# Patient Record
Sex: Female | Born: 1945
Health system: Southern US, Community
[De-identification: ages and names within clinical notes are randomized; demographics above are authoritative.]

## PROBLEM LIST (undated history)

## (undated) DIAGNOSIS — I4891 Unspecified atrial fibrillation: Secondary | ICD-10-CM

## (undated) DIAGNOSIS — K760 Fatty (change of) liver, not elsewhere classified: Secondary | ICD-10-CM

## (undated) DIAGNOSIS — Z9989 Dependence on other enabling machines and devices: Secondary | ICD-10-CM

## (undated) DIAGNOSIS — I2510 Atherosclerotic heart disease of native coronary artery without angina pectoris: Secondary | ICD-10-CM

## (undated) DIAGNOSIS — Z923 Personal history of irradiation: Secondary | ICD-10-CM

## (undated) DIAGNOSIS — I4892 Unspecified atrial flutter: Secondary | ICD-10-CM

## (undated) DIAGNOSIS — Z9181 History of falling: Secondary | ICD-10-CM

## (undated) DIAGNOSIS — K5790 Diverticulosis of intestine, part unspecified, without perforation or abscess without bleeding: Secondary | ICD-10-CM

## (undated) DIAGNOSIS — IMO0001 Reserved for inherently not codable concepts without codable children: Secondary | ICD-10-CM

## (undated) DIAGNOSIS — R55 Syncope and collapse: Secondary | ICD-10-CM

## (undated) DIAGNOSIS — Z950 Presence of cardiac pacemaker: Secondary | ICD-10-CM

## (undated) DIAGNOSIS — T4145XA Adverse effect of unspecified anesthetic, initial encounter: Secondary | ICD-10-CM

## (undated) DIAGNOSIS — Q2112 Patent foramen ovale: Secondary | ICD-10-CM

## (undated) DIAGNOSIS — G4733 Obstructive sleep apnea (adult) (pediatric): Secondary | ICD-10-CM

## (undated) DIAGNOSIS — C50919 Malignant neoplasm of unspecified site of unspecified female breast: Secondary | ICD-10-CM

## (undated) DIAGNOSIS — M5412 Radiculopathy, cervical region: Secondary | ICD-10-CM

## (undated) DIAGNOSIS — E039 Hypothyroidism, unspecified: Secondary | ICD-10-CM

## (undated) DIAGNOSIS — D126 Benign neoplasm of colon, unspecified: Secondary | ICD-10-CM

## (undated) DIAGNOSIS — Z9221 Personal history of antineoplastic chemotherapy: Secondary | ICD-10-CM

## (undated) DIAGNOSIS — J449 Chronic obstructive pulmonary disease, unspecified: Secondary | ICD-10-CM

## (undated) DIAGNOSIS — E662 Morbid (severe) obesity with alveolar hypoventilation: Secondary | ICD-10-CM

## (undated) DIAGNOSIS — I10 Essential (primary) hypertension: Secondary | ICD-10-CM

## (undated) DIAGNOSIS — L0390 Cellulitis, unspecified: Secondary | ICD-10-CM

## (undated) DIAGNOSIS — Q211 Atrial septal defect: Secondary | ICD-10-CM

## (undated) DIAGNOSIS — I509 Heart failure, unspecified: Secondary | ICD-10-CM

## (undated) DIAGNOSIS — F329 Major depressive disorder, single episode, unspecified: Secondary | ICD-10-CM

## (undated) DIAGNOSIS — K224 Dyskinesia of esophagus: Secondary | ICD-10-CM

## (undated) DIAGNOSIS — E785 Hyperlipidemia, unspecified: Secondary | ICD-10-CM

## (undated) DIAGNOSIS — R7302 Impaired glucose tolerance (oral): Secondary | ICD-10-CM

## (undated) DIAGNOSIS — I219 Acute myocardial infarction, unspecified: Secondary | ICD-10-CM

## (undated) DIAGNOSIS — Z9981 Dependence on supplemental oxygen: Secondary | ICD-10-CM

## (undated) DIAGNOSIS — Z8742 Personal history of other diseases of the female genital tract: Secondary | ICD-10-CM

## (undated) DIAGNOSIS — G629 Polyneuropathy, unspecified: Secondary | ICD-10-CM

## (undated) DIAGNOSIS — D649 Anemia, unspecified: Secondary | ICD-10-CM

## (undated) DIAGNOSIS — T8859XA Other complications of anesthesia, initial encounter: Secondary | ICD-10-CM

## (undated) HISTORY — PX: CERVICAL DISC SURGERY: SHX588

## (undated) HISTORY — DX: Hyperlipidemia, unspecified: E78.5

## (undated) HISTORY — DX: Obstructive sleep apnea (adult) (pediatric): G47.33

## (undated) HISTORY — PX: EPIGASTRIC HERNIA REPAIR: SUR1177

## (undated) HISTORY — DX: Essential (primary) hypertension: I10

## (undated) HISTORY — DX: Malignant neoplasm of unspecified site of unspecified female breast: C50.919

## (undated) HISTORY — DX: Radiculopathy, cervical region: M54.12

## (undated) HISTORY — DX: Benign neoplasm of colon, unspecified: D12.6

## (undated) HISTORY — DX: Major depressive disorder, single episode, unspecified: F32.9

## (undated) HISTORY — DX: Dependence on other enabling machines and devices: Z99.89

## (undated) HISTORY — PX: COLONOSCOPY: SHX174

## (undated) HISTORY — DX: Anemia, unspecified: D64.9

## (undated) HISTORY — PX: EYE SURGERY: SHX253

## (undated) HISTORY — DX: Morbid (severe) obesity with alveolar hypoventilation: E66.2

## (undated) HISTORY — PX: DILATION AND CURETTAGE OF UTERUS: SHX78

## (undated) HISTORY — DX: Polyneuropathy, unspecified: G62.9

## (undated) HISTORY — PX: BREAST BIOPSY: SHX20

## (undated) HISTORY — DX: Hypothyroidism, unspecified: E03.9

## (undated) HISTORY — DX: Impaired glucose tolerance (oral): R73.02

## (undated) HISTORY — DX: Unspecified atrial flutter: I48.92

## (undated) HISTORY — DX: History of falling: Z91.81

## (undated) HISTORY — DX: Atherosclerotic heart disease of native coronary artery without angina pectoris: I25.10

## (undated) HISTORY — DX: Dyskinesia of esophagus: K22.4

## (undated) HISTORY — PX: OVARIAN CYST REMOVAL: SHX89

## (undated) HISTORY — PX: APPENDECTOMY: SHX54

## (undated) HISTORY — DX: Diverticulosis of intestine, part unspecified, without perforation or abscess without bleeding: K57.90

## (undated) HISTORY — PX: LOOP RECORDER IMPLANT: SHX5954

---
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
## 2004-05-06 ENCOUNTER — Encounter: Payer: Self-pay | Admitting: Cardiology

## 2004-05-21 HISTORY — PX: BREAST LUMPECTOMY: SHX2

## 2005-10-26 ENCOUNTER — Encounter: Payer: Self-pay | Admitting: Cardiology

## 2008-10-28 ENCOUNTER — Emergency Department (HOSPITAL_COMMUNITY): Admission: EM | Admit: 2008-10-28 | Discharge: 2008-10-28 | Payer: Self-pay | Admitting: Emergency Medicine

## 2008-10-29 ENCOUNTER — Emergency Department (HOSPITAL_COMMUNITY): Admission: EM | Admit: 2008-10-29 | Discharge: 2008-10-29 | Payer: Self-pay | Admitting: Emergency Medicine

## 2008-11-08 ENCOUNTER — Emergency Department (HOSPITAL_COMMUNITY): Admission: EM | Admit: 2008-11-08 | Discharge: 2008-11-08 | Payer: Self-pay | Admitting: Emergency Medicine

## 2008-11-18 ENCOUNTER — Ambulatory Visit: Payer: Self-pay | Admitting: Vascular Surgery

## 2008-11-18 ENCOUNTER — Encounter (INDEPENDENT_AMBULATORY_CARE_PROVIDER_SITE_OTHER): Payer: Self-pay | Admitting: Emergency Medicine

## 2008-11-18 ENCOUNTER — Emergency Department (HOSPITAL_COMMUNITY): Admission: EM | Admit: 2008-11-18 | Discharge: 2008-11-19 | Payer: Self-pay | Admitting: Emergency Medicine

## 2009-02-19 ENCOUNTER — Encounter: Payer: Self-pay | Admitting: Infectious Diseases

## 2009-02-28 ENCOUNTER — Encounter: Payer: Self-pay | Admitting: Infectious Diseases

## 2009-02-28 DIAGNOSIS — L03119 Cellulitis of unspecified part of limb: Secondary | ICD-10-CM

## 2009-02-28 DIAGNOSIS — L02419 Cutaneous abscess of limb, unspecified: Secondary | ICD-10-CM

## 2009-03-17 ENCOUNTER — Ambulatory Visit: Payer: Self-pay | Admitting: Infectious Diseases

## 2009-03-17 DIAGNOSIS — E039 Hypothyroidism, unspecified: Secondary | ICD-10-CM

## 2009-03-17 DIAGNOSIS — I2510 Atherosclerotic heart disease of native coronary artery without angina pectoris: Secondary | ICD-10-CM

## 2009-03-17 DIAGNOSIS — E785 Hyperlipidemia, unspecified: Secondary | ICD-10-CM

## 2009-03-17 DIAGNOSIS — I10 Essential (primary) hypertension: Secondary | ICD-10-CM

## 2009-03-18 ENCOUNTER — Encounter: Payer: Self-pay | Admitting: Infectious Diseases

## 2010-02-12 ENCOUNTER — Inpatient Hospital Stay (HOSPITAL_COMMUNITY): Admission: EM | Admit: 2010-02-12 | Discharge: 2010-02-19 | Payer: Self-pay | Admitting: Emergency Medicine

## 2010-02-12 ENCOUNTER — Ambulatory Visit: Payer: Self-pay | Admitting: Cardiovascular Disease

## 2010-02-12 ENCOUNTER — Ambulatory Visit: Payer: Self-pay | Admitting: Internal Medicine

## 2010-02-13 ENCOUNTER — Encounter: Payer: Self-pay | Admitting: Cardiology

## 2010-02-13 ENCOUNTER — Ambulatory Visit: Payer: Self-pay | Admitting: Vascular Surgery

## 2010-02-13 ENCOUNTER — Encounter (INDEPENDENT_AMBULATORY_CARE_PROVIDER_SITE_OTHER): Payer: Self-pay | Admitting: Internal Medicine

## 2010-02-17 ENCOUNTER — Encounter: Payer: Self-pay | Admitting: Internal Medicine

## 2010-03-10 ENCOUNTER — Ambulatory Visit: Payer: Self-pay | Admitting: Pulmonary Disease

## 2010-03-10 DIAGNOSIS — G4730 Sleep apnea, unspecified: Secondary | ICD-10-CM

## 2010-03-10 DIAGNOSIS — J449 Chronic obstructive pulmonary disease, unspecified: Secondary | ICD-10-CM

## 2010-03-13 ENCOUNTER — Ambulatory Visit: Payer: Self-pay | Admitting: Cardiology

## 2010-03-13 DIAGNOSIS — I214 Non-ST elevation (NSTEMI) myocardial infarction: Secondary | ICD-10-CM

## 2010-03-17 ENCOUNTER — Telehealth (INDEPENDENT_AMBULATORY_CARE_PROVIDER_SITE_OTHER): Payer: Self-pay | Admitting: *Deleted

## 2010-03-20 ENCOUNTER — Inpatient Hospital Stay (HOSPITAL_COMMUNITY): Admission: EM | Admit: 2010-03-20 | Discharge: 2010-03-24 | Payer: Self-pay | Admitting: Emergency Medicine

## 2010-03-20 ENCOUNTER — Ambulatory Visit: Payer: Self-pay | Admitting: Vascular Surgery

## 2010-03-30 ENCOUNTER — Encounter: Payer: Self-pay | Admitting: Pulmonary Disease

## 2010-03-30 ENCOUNTER — Ambulatory Visit (HOSPITAL_BASED_OUTPATIENT_CLINIC_OR_DEPARTMENT_OTHER): Admission: RE | Admit: 2010-03-30 | Discharge: 2010-03-30 | Payer: Self-pay | Admitting: Pulmonary Disease

## 2010-04-06 ENCOUNTER — Ambulatory Visit: Payer: Self-pay | Admitting: Pulmonary Disease

## 2010-04-07 ENCOUNTER — Ambulatory Visit: Payer: Self-pay | Admitting: Pulmonary Disease

## 2010-04-07 DIAGNOSIS — Z9981 Dependence on supplemental oxygen: Secondary | ICD-10-CM

## 2010-04-09 ENCOUNTER — Ambulatory Visit: Payer: Self-pay | Admitting: Pulmonary Disease

## 2010-04-10 ENCOUNTER — Encounter: Payer: Self-pay | Admitting: Pulmonary Disease

## 2010-04-15 ENCOUNTER — Telehealth (INDEPENDENT_AMBULATORY_CARE_PROVIDER_SITE_OTHER): Payer: Self-pay | Admitting: *Deleted

## 2010-04-24 ENCOUNTER — Ambulatory Visit: Payer: Self-pay | Admitting: Cardiology

## 2010-05-20 ENCOUNTER — Telehealth: Payer: Self-pay | Admitting: Cardiology

## 2010-05-26 ENCOUNTER — Encounter: Payer: Self-pay | Admitting: Pulmonary Disease

## 2010-05-26 ENCOUNTER — Encounter (HOSPITAL_COMMUNITY): Admission: RE | Admit: 2010-05-26 | Discharge: 2010-06-19 | Payer: Self-pay | Admitting: Pulmonary Disease

## 2010-06-20 ENCOUNTER — Encounter (HOSPITAL_COMMUNITY)
Admission: RE | Admit: 2010-06-20 | Discharge: 2010-09-18 | Payer: Self-pay | Source: Home / Self Care | Attending: Pulmonary Disease | Admitting: Pulmonary Disease

## 2010-07-08 ENCOUNTER — Telehealth (INDEPENDENT_AMBULATORY_CARE_PROVIDER_SITE_OTHER): Payer: Self-pay | Admitting: *Deleted

## 2010-08-05 ENCOUNTER — Encounter (INDEPENDENT_AMBULATORY_CARE_PROVIDER_SITE_OTHER): Payer: Self-pay | Admitting: Emergency Medicine

## 2010-08-05 ENCOUNTER — Ambulatory Visit: Payer: Self-pay | Admitting: Vascular Surgery

## 2010-08-05 ENCOUNTER — Inpatient Hospital Stay (HOSPITAL_COMMUNITY): Admission: EM | Admit: 2010-08-05 | Discharge: 2010-08-07 | Payer: Self-pay | Admitting: Emergency Medicine

## 2010-08-24 ENCOUNTER — Emergency Department (HOSPITAL_COMMUNITY)
Admission: EM | Admit: 2010-08-24 | Discharge: 2010-08-24 | Payer: Self-pay | Source: Home / Self Care | Admitting: Emergency Medicine

## 2010-09-04 ENCOUNTER — Ambulatory Visit: Payer: Self-pay | Admitting: Cardiology

## 2010-09-10 ENCOUNTER — Ambulatory Visit: Payer: Self-pay | Admitting: Pulmonary Disease

## 2010-10-20 NOTE — Progress Notes (Signed)
Summary: returning call  Phone Note Call from Patient   Caller: Patient Call For: alva Summary of Call: Returning phone call from yesterday. Initial call taken by: Netta Neat,  April 15, 2010 1:56 PM  Follow-up for Phone Call        Spoke with pt and notified of results of ONO.  Pt verbalized understanding and will cont. o2 at hs. Follow-up by: Tilden Dome,  April 15, 2010 2:03 PM

## 2010-10-20 NOTE — Letter (Signed)
[facility]   Imported By: Marilynne Drivers 05/11/2010 12:27:01  _____________________________________________________________________  External Attachment:    Type:   Image     Comment:   External Document

## 2010-10-20 NOTE — Miscellaneous (Signed)
Summary: Orders Update pft charges  Clinical Lists Changes  Orders: Added new Service order of Lung Volumes (04045) - Signed Added new Service order of Carbon Monoxide diffusing w/capacity (91368) - Signed Added new Service order of Spirometry (Pre & Post) [phone]) - Signed

## 2010-10-20 NOTE — Assessment & Plan Note (Signed)
Summary: f30m  Visit Type:  1 month follow up Referring Standley Bargo:  hospital Primary Shae Hinnenkamp:  Dr. JEsperanza Richters CC:  Sob and fluid retention.  History of Present Illness: Ms. HStmariewas readmitted to the hospital recently after an admission again for recurrent cellulitis.  This has improved. She and I had a lengthy discussioin today about her issues.  [facility]    Findings were as noted: EF 75%.  RCA  proximal 75% tubular, 25% prox LAD, 25% d1, 50% D2.   [facility], with RL edema, worse than left, with presumed cellulitis.  She had a PIC line placed, and was placed on chronic treatment.   Current Medications (verified): 1)  Metoprolol Tartrate 25 Mg Tabs (Metoprolol Tartrate) .... Take 1 1/2 Tablet By Mouth Every Morning and Take 1/2 Tab By Mouth At Bedtime 2)  Klor-Con 10 10 Meq Cr-Tabs (Potassium Chloride) .... Take One Tablet Daily 3)  Levothyroxine Sodium 200 Mcg Tabs (Levothyroxine Sodium) .... Take 1.765m On Tuesdays- Frydays and Sunday and 20058mRest of The Week 4)  Cymbalta 30 Mg Cpep (Duloxetine Hcl) .... Take Two Capsules At Bedtime 5)  Furosemide 40 Mg Tabs (Furosemide) .... Take One Tablet Daily 6)  Pravastatin Sodium 40 Mg Tabs (Pravastatin Sodium) .... Take One Tablet Daily 7)  Aspir-Low 81 Mg Tbec (Aspirin) .... Take 1 Tablet By Mouth Once A Day 8)  Amitriptyline Hcl 100 Mg Tabs (Amitriptyline Hcl) .... At Bedtime For Neuropathic Pain 9)  Proair Hfa 108 (90 Base) Mcg/act Aers (Albuterol Sulfate) .... 2 Puffs Every 4 Hours As Needed 10)  Vitamin D3 2000 Unit Caps (Cholecalciferol) .... Take 1 Tablet By Mouth Every Morning 11)  Vitamin C 500 Mg Tabs (Ascorbic Acid) .... Take 2 Tablet By Mouth Once A Day 12)  Omega-3 Fish Oil 1200 Mg Caps (Omega-3 Fatty Acids) .... Take 2 Tablet By Mouth Every Morning 13)  Medroxyprogesterone Acetate 10 Mg Tabs (Medroxyprogesterone  Acetate) .... Take 1 Tablet By Mouth Once A Day X 10 Days  Allergies: 1)  ! Lisinopril (Lisinopril) 2)  ! Codeine  Past History:  Past Medical History: Last updated: 04/21/2010  Hyperlipidemia  Hypertension  Hypothyroidism  Breast CA- Left-  XRT and CTX April 2005  Coronary artery disease  Non-ST elevation MI in June of 2011.   History of acute renal failure in June of 2011.   Obstructive sleep apnea.  She uses a BiPAP at night.   Acute on chronic respiratory failure.   Anemia  Left lower extremity cellulitis with 2 cc abscess  Past Surgical History: Last updated: 04/21/2010  1. Surgeries include back surgery.  2. She has had a lumpectomy.   3. Laparotomy.   4. Left eye surgery.  5. Ovarian cyst removal  Family History: Last updated: 04/21/2010 FHx- breast CA  Is significant for her father who died of an MI in his   60s56ser mother who had uterine cancer.  She has 11 brothers and  sisters; she tells me that several of her brothers died with alcoholism.   Social History: Last updated: 04/21/2010  Is pertinent for rare alcohol.  She is an ex-smoker   with a 20 pack year history.  No drug use.  She has one grandson who  lives with her.  Risk Factors: Alcohol Use: 0 (03/17/2009) Caffeine Use: 0 (03/17/2009) Exercise: yes (03/17/2009)  Risk  Factors: Smoking Status: quit (03/10/2010) Packs/Day: 1.0 (03/10/2010)  Vital Signs:  Patient profile:   65 year old female Height:      66 inches Weight:      336 pounds BMI:     54.43 Pulse rate:   88 / minute Pulse rhythm:   regular Resp:     20 per minute BP sitting:   110 / 64  (right arm) Cuff size:   large  Vitals Entered By: Sidney Ace (April 24, 2010 3:14 PM)  Physical Exam  General:  Overweight, chronically ill, but delightful women in no distress Head:  normocephalic and atraumatic Eyes:  PERRLA/EOM intact; conjunctiva and lids normal. Lungs:  Clear bilaterally to auscultation and percussion. Heart:   Heart sounds distant.  Normal S1 and S2.  No definite murmur.  Abdomen:  Obese. Extremities:  Edema L>R.  Cellulitis controlled.   Neurologic:  Alert and oriented x 3.   Echocardiogram  Procedure date:  02/13/2010  Findings:       Study Conclusions    - Left ventricle: The cavity size was normal. Wall thickness was     increased in a pattern of moderate LVH. Systolic function was     normal. The estimated ejection fraction was in the range of 55% to     60%.   - Aortic valve: There was very mild stenosis.  Sleep Study  Procedure date:  03/30/2010  Findings:       </IMPRESSIONS-   1. Mild sleep disordered breathing with predominant respiratory effort       related arousals, suggestive of upper airway resistance syndrome.   2. Sleep related hypoxemia, note that this study was performed on 2       liters of oxygen.   3. No evidence of limb movements, cardiac arrhythmias, or behavioral       disturbance during sleep.      RECOMMENDATIONS/>   1. Weight loss would be recommended.   2. Nocturnal oximetry on room air should be assessed if significant       desaturations are noted.  This would be related to underlying       cardiopulmonary disease, not the long history of smoking.   3. She should be cautioned against driving when sleepy.  She should be       asked to avoid medications with sedative side effects.   Impression & Recommendations:  Problem # 1:  CORONARY ARTERY DISEASE (ICD-414.00) See results of cath study.  She and I had a very long discussion today about options.  Results of prior trials discussed.  Cath at [facility] does demonstrate some disease, but only enzyme elevation occurred in face of CO2 narcosis, and severe respiratory failure.   Now she is without typical ischemic symptoms.  As these are controlled, options of cath versus conservative medical management discussed. Currently, plan is for the latter.  Will continue to monitor closely. Her updated medication list  for this problem includes:    Metoprolol Tartrate 25 Mg Tabs (Metoprolol tartrate) .[name]... Take 1 1/2 tablet by mouth every morning and take 1/2 tab by mouth at bedtime    Aspir-low 81 Mg Tbec (Aspirin) .[name]... Take 1 tablet by mouth once a day  Problem # 2:  HYPOTHYROIDISM (ICD-244.9) treated Her updated medication list for this problem includes:    Levothyroxine Sodium 200 Mcg Tabs (Levothyroxine sodium) .[name]... Take 1.54mg on tuesdays- frydays and sunday and 2019m rest of the week  Problem # 3:  OBSTRUCTIVE SLEEP APNEA (ICD-780.57) see findings of sleep studies. Wieght loss encouraged.   Problem # 4:  CELLULITIS, LEG, LEFT (ICD-682.6) currently on four weeks of antibiotic treatment, and apparently improved per patient.    Patient Instructions: 1)  Your physician recommends that you schedule a follow-up appointment in: 2 months

## 2010-10-20 NOTE — Progress Notes (Signed)
  Records request faxed to [place]  March 17, 2010 1:53 PM

## 2010-10-20 NOTE — Progress Notes (Addendum)
Summary: needs clearance for procedure  Phone Note From Other Clinic Call back at [facility] Phone [phone]   Caller: Provider Summary of Call: Per Dr Guy Franco GYN DR  pt needs a [facility] for postmenapausal bleeding. is pt ok for this procedure pager # [phone] fax [phone] Initial call taken by: Lorraine Lax,  May 20, 2010 11:03 AM  Follow-up for Phone Call        Pager called.  Awaiting call back.  Patientis somewhat high risk, and not well known by me.  Seen as unassigned at [facility].  Prior cath at [facility]. Stable on meds at present, but recent non STEMI, ARF.  Would rec consult on site for coverage so emergent care available.   Follow-up by: Wadie Lessen, MD, [facility],  May 21, 2010 3:08 PM     Appended Document: needs clearance for procedure**TS** I spoke with the pt and made her aware that Dr Lia Foyer felt she is high risk for D & C.  Dr Lia Foyer did discuss this pt with Dr Marijean Bravo. Dr Lia Foyer recommends that the pt see a Cardiologist at [facility] prior to procedure.  The pt will come into the office tomorrow to sign a release of records to have them sent to Dr Marijean Bravo.  Dr Marijean Bravo will arrange an appt for the pt to be evaluated by a Cardiologist at [facility].   Appended Document: needs clearance for procedure Spoke with Dr. Marijean Bravo.  She was concerned about her overall medical condition, and mentioned that this was a longer standing issue.  I expressed concern about her overall status, and suggested given her pulmonary and cardiac issues that she should be seen prior to any type of procedure at [facility] by the cardiology service so they are familiar with her case.  Notably, her last cath was at [facility] in 2005.  I have seen her just twice after presentation with acute respiratory failure, picking her up at the [facility].  We had discussed in detail continued medical treatment in light of her other issues.  However, surgery would be higher risk in general.  TS

## 2010-10-20 NOTE — Procedures (Signed)
Summary: Pulse Oximetry/IDS  Pulse Oximetry/IDS   Imported By: Phillis Knack 04/17/2010 10:33:49  _____________________________________________________________________  External Attachment:    Type:   Image     Comment:   External Document

## 2010-10-20 NOTE — Progress Notes (Signed)
Summary: surgical clearance at [facility]   Phone Note From Other Clinic Call back at [phone]   Caller: Patient Call For: alva Caller: [facility] pre opt screening karen, Nurse Practitioner Call For: alva Summary of Call: pt having surgery tomorrow need clearance for hysteroscopy and d and c. she will have mild sedation Initial call taken by: Gustavus Bryant,  July 08, 2010 3:22 PM  Follow-up for Phone Call        called and spoke with Santiago Glad from [place].  Santiago Glad states pt is scheduled for hysteroscopy and D&C TOMORROW!! She states this is a "low risk procedure"  approx 30 mins long.  and they are using local/mac anesthesia.  Requesting RA's recs, last OV notes, PFTs, etc.  RA not in office this week. Pt hasn't been seen by RA since July 2011.  Will page RA for his recs.  Jinny Blossom Reynolds LPN  July 08, 3253 4:02 PM   karen's fax #  [phone]  Additional Follow-up for Phone Call Additional follow up Details #1::        Send last note - let them know that she uses CPAP 10 cm + 2 L O2 during sleep - would continue this after/ during procedure if conscious sedation used Cannot opinion on her risk for surgery - I have only seen her once after hospital admission in July. Will need OV to opinion further. She seems to have cardiac issues also.  Additional Follow-up by: Leanna Sato. Elsworth Soho MD,  July 08, 2010 4:15 PM    Additional Follow-up for Phone Call Additional follow up Details #2::    [facility] for Santiago Glad informing her of RA's response.  Gave her our call back # to call back if she has any further questions.  Faxed last ov note to fax # she gave me.  Matthew Folks LPN  July 08, 9406 4:34 PM

## 2010-10-20 NOTE — Assessment & Plan Note (Signed)
Summary: eph per pt call/lg   Visit Type:  EPH Referring Provider:  hospital Primary Provider:  Dr. Esperanza Richters  CC:  edema/ankles/legs....denies any cp or sob.  History of Present Illness: No chest pain in the hospital.  Had positive enzymes, borderline, but definite.  No definite ECG change.  It occurred in the setting of respiratory failure.  Has obesity hypoventilation, and now on home O2.  Denies any more chest pain.  Had venous stasis and cellutlis as well.  Prior history of breast CA.    Current Medications (verified): 1)  Metoprolol Tartrate 25 Mg Tabs (Metoprolol Tartrate) .... Take 1 1/2 Tablet By Mouth Every Morning and Take 1/2 Tab By Mouth At Bedtime 2)  Klor-Con 10 10 Meq Cr-Tabs (Potassium Chloride) .... Take One Tablet Daily 3)  Levothyroxine Sodium 200 Mcg Tabs (Levothyroxine Sodium) .... Take 1 Tablet By Mouth Once A Day 4)  Cymbalta 30 Mg Cpep (Duloxetine Hcl) .... Take Two Capsules At Bedtime 5)  Furosemide 40 Mg Tabs (Furosemide) .... Take One Tablet Daily 6)  Pravastatin Sodium 40 Mg Tabs (Pravastatin Sodium) .... Take One Tablet Daily 7)  Aspir-Low 81 Mg Tbec (Aspirin) .... Take 1 Tablet By Mouth Once A Day 8)  Hydrochlorothiazide 25 Mg Tabs (Hydrochlorothiazide) .... Take 1 Tablet By Mouth Once A Day As Needed Edema 9)  Amitriptyline Hcl 100 Mg Tabs (Amitriptyline Hcl) .... At Bedtime For Neuropathic Pain 10)  Proair Hfa 108 (90 Base) Mcg/act Aers (Albuterol Sulfate) .... 2 Puffs Every 4 Hours As Needed 11)  Vitamin D3 2000 Unit Caps (Cholecalciferol) .... Take 1 Tablet By Mouth Every Morning 12)  Vitamin C 500 Mg Tabs (Ascorbic Acid) .... Take 2 Tablet By Mouth Once A Day 13)  Omega-3 Fish Oil 1200 Mg Caps (Omega-3 Fatty Acids) .... Take 2 Tablet By Mouth Every Morning  Allergies: 1)  ! Lisinopril (Lisinopril) 2)  ! Codeine  Past History:  Past Medical History: Last updated: 03/17/2009 Hyperlipidemia Hypertension Hypothyroidism Breast CA- Left-   XRT and CTX April 2005 Coronary artery disease  Past Surgical History: Last updated: 03/10/2010 Ovarian cyst removal Lumpectomy Back surgery Left eye surgery  Family History: Last updated: 03/17/2009 FHx- breast CA  Social History: Last updated: 03/10/2010 Alcohol use-yes Marital Status:  Children: yes Occupation: retired Patient states former smoker. (quit 2004 1ppd)  Risk Factors: Alcohol Use: 0 (03/17/2009) Caffeine Use: 0 (03/17/2009) Exercise: yes (03/17/2009)  Risk Factors: Smoking Status: quit (03/10/2010) Packs/Day: 1.0 (03/10/2010)  Vital Signs:  Patient profile:   65 year old female Height:      66 inches Weight:      343 pounds BMI:     55.56 Pulse rate:   100 / minute Pulse rhythm:   regular BP sitting:   136 / 80  (right arm) Cuff size:   large  Vitals Entered By: Julaine Hua, CMA (March 13, 2010 4:45 PM)  Physical Exam  General:  Morbidly obese female, in no distress at present.   Head:  normocephalic and atraumatic Eyes:  PERRLA/EOM intact; conjunctiva and lids normal. Lungs:  Clear bilaterally to auscultation and percussion. Heart:  Distant heart sounds without murmur.  Abdomen:  Protuberant.  High diaphragm related to obesity.  Extremities:  Large. Neurologic:  Alert and oriented x 3.   Echocardiogram  Procedure date:  02/13/2010  Findings:       Study Conclusions    - Left ventricle: The cavity size was normal. Wall thickness was     increased in  a pattern of moderate LVH. Systolic function was     normal. The estimated ejection fraction was in the range of 55% to     60%.   - Aortic valve: There was very mild stenosis.  Echocardiogram  Procedure date:  03/13/2010  Findings:      NSR.  Low voltage QRS.  Otherwise normal tracing.  Impression & Recommendations:  Problem # 1:  OBSTRUCTIVE SLEEP APNEA (ICD-780.57) Likely has obesity hypoventilation.  Presented with a hypercarbic respiratory acidosis, and had prolonged  hospitalization.  Had Non STEMI in this setting.  No current symptoms.  Scheduled for followup with pulmonary medicine.   Problem # 2:  ACUT MI SUBENDOCARDIAL INFARCT SUBSQT EPIS CARE (ICD-410.72) Presented in setting of respiratory failure.  No chest pain since that time.  Troponin was just over 1, and CKMB 10.  Remains stable on a medical regimen.  We had a long discussion about this.  She is not a good candidate for non invasive imaging, and her weight is somewhat prohibitive.  Regarding findings,would favor a medical approach for the short term, with early followup.  She could be cathed from the arm, but imaging would be difficult.   Her updated medication list for this problem includes:    Metoprolol Tartrate 25 Mg Tabs (Metoprolol tartrate) .[name]... Take 1 1/2 tablet by mouth every morning and take 1/2 tab by mouth at bedtime    Aspir-low 81 Mg Tbec (Aspirin) .[name]... Take 1 tablet by mouth once a day  Problem # 3:  HYPERLIPIDEMIA (ICD-272.4) Currently on lipid lowering therpay, and will need follow up for this.  Her updated medication list for this problem includes:    Pravastatin Sodium 40 Mg Tabs (Pravastatin sodium) .[name]... Take one tablet daily  Problem # 4:  HYPOTHYROIDISM (ICD-244.9) On replacement.  Her updated medication list for this problem includes:    Levothyroxine Sodium 200 Mcg Tabs (Levothyroxine sodium) .[name]... Take 1 tablet by mouth once a day  Problem # 5:  CELLULITIS, LEG, LEFT (ICD-682.6) Markedly improved since discharge from the hospital.    Patient Instructions: 1)  Your physician recommends that you schedule a follow-up appointment in: 1 MONTH 2)  Your physician recommends that you continue on your current medications as directed. Please refer to the Current Medication list given to you today.  Appended Document: eph per pt call/lg Had cath study at [facility], Dr. Eustace Pen, at [facility], in 2005.  Will request those reports. This occured while on chemotherapy. Lumpectomy  Sept 2005 Ovarian Cyst 1974 Brother had AVR

## 2010-10-20 NOTE — Letter (Signed)
Summary: [facility]  [facility]   Imported By: Marilynne Drivers 05/11/2010 12:26:19  _____________________________________________________________________  External Attachment:    Type:   Image     Comment:   External Document

## 2010-10-20 NOTE — Consult Note (Signed)
Summary: [facility]   Imported By: Phillis Knack 03/04/2010 07:57:07  _____________________________________________________________________  External Attachment:    Type:   Image     Comment:   External Document

## 2010-10-20 NOTE — Assessment & Plan Note (Signed)
Summary: hfu/mhh   Visit Type:  Hospital Follow-up Copy to:  hospital Primary Provider/Referring Provider:  Dr. Esperanza Richters  CC:  Pt here for hospital follow up. Pt c/o SOB with exertion, weakness, intermittent productive cough, and chest congestion.  History of Present Illness: 65 year old morbidly obese female patient with supposedly diagnosed sleep apnea, hypertension,breast cancer with left lumpectomy and hypothyroidism was admitted 5/26-02/19/10  with left lower extremity pain and swelling, course complicated by acute hypercarbic resp failure requiring BiPAP after receiving dilaudid. Duplex neg DVT, CT angio neg PE, echo >> nml LV fn, mod LVH. She was discharged on BiPAP , full face mask with 2-3 L O2 ([facility]). She was non compliant with her CPAP priro because she did not realise the 'gravity of her illness'. She smoked a PPD x 30 yrs before quitting in 2004. She uses ativan to get over th eanxiety of BiPAP but daughter finds her sitting in a chair at night , she has no memory of this. 20 mg lisinopril taken off med list- denies cough.    Preventive Screening-Counseling & Management  Alcohol-Tobacco     Smoking Status: quit     Packs/Day: 1.0     Year Started: 1967     Year Quit: 2004  Current Medications (verified): 1)  Metoprolol Tartrate 25 Mg Tabs (Metoprolol Tartrate) .... Take 1 1/2 Tablet By Mouth Every Morning and Take 1/2 Tab By Mouth At Bedtime 2)  Klor-Con 10 10 Meq Cr-Tabs (Potassium Chloride) .... Take One Tablet Daily 3)  Levothyroxine Sodium 200 Mcg Tabs (Levothyroxine Sodium) .... Take 1 Tablet By Mouth Once A Day 4)  Cymbalta 30 Mg Cpep (Duloxetine Hcl) .... Take Two Capsules At Bedtime 5)  Lorazepam 1 Mg Tabs (Lorazepam) .... Take One Tablet At Bedtime As Needed 6)  Furosemide 40 Mg Tabs (Furosemide) .... Take One Tablet Daily 7)  Pravastatin Sodium 40 Mg Tabs (Pravastatin Sodium) .... Take One Tablet Daily 8)  Aspir-Low 81 Mg Tbec (Aspirin) .... Take 1  Tablet By Mouth Once A Day 9)  Hydrochlorothiazide 25 Mg Tabs (Hydrochlorothiazide) .... Take 1 Tablet By Mouth Once A Day As Needed Edema 10)  Amitriptyline Hcl 100 Mg Tabs (Amitriptyline Hcl) .... At Bedtime For Neuropathic Pain 11)  Proair Hfa 108 (90 Base) Mcg/act Aers (Albuterol Sulfate) .... 2 Puffs Every 4 Hours As Needed 12)  Vitamin D3 2000 Unit Caps (Cholecalciferol) .... Take 1 Tablet By Mouth Every Morning 13)  Vitamin C 500 Mg Tabs (Ascorbic Acid) .... Take 2 Tablet By Mouth Once A Day 14)  Omega-3 Fish Oil 1200 Mg Caps (Omega-3 Fatty Acids) .... Take 2 Tablet By Mouth Every Morning  Allergies (verified): 1)  ! Lisinopril (Lisinopril) 2)  ! Codeine  Past History:  Past Medical History: Last updated: 03/17/2009 Hyperlipidemia Hypertension Hypothyroidism Breast CA- Left-  XRT and CTX April 2005 Coronary artery disease  Social History: Last updated: 03/10/2010 Alcohol use-yes Marital Status:  Children: yes Occupation: retired Patient states former smoker. (quit 2004 1ppd)  Past Surgical History: Ovarian cyst removal Lumpectomy Back surgery Left eye surgery  Social History: Alcohol use-yes Marital Status:  Children: yes Occupation: retired Patient states former smoker. (quit 2004 1ppd) Packs/Day:  1.0  Review of Systems       The patient complains of shortness of breath with activity, productive cough, non-productive cough, weight change, anxiety, hand/feet swelling, and joint stiffness or pain.  The patient denies shortness of breath at rest, coughing up blood, chest pain, irregular heartbeats, acid heartburn,  indigestion, loss of appetite, abdominal pain, difficulty swallowing, sore throat, tooth/dental problems, headaches, nasal congestion/difficulty breathing through nose, sneezing, itching, ear ache, depression, rash, change in color of mucus, and fever.    Vital Signs:  Patient profile:   65 year old female Height:      66 inches Weight:      345  pounds BMI:     55.89 O2 Sat:      92 % on 3 L/min pulsed Temp:     98.3 degrees F oral Pulse rate:   85 / minute BP sitting:   130 / 80  (right arm) Cuff size:   large lower arm  Vitals Entered By: Iran Planas CMA (March 10, 2010 10:38 AM)  O2 Flow:  3 L/min pulsed CC: Pt here for hospital follow up. Pt c/o SOB with exertion, weakness, intermittent productive cough, chest congestion Comments Medications reviewed with patient Verified contact number and pharmacy with patient Iran Planas CMA  March 10, 2010 10:39 AM    Physical Exam  Additional Exam:  Gen. Pleasant, morbidly obese, in no distress on2L pulse O2, normal affect ENT - no lesions, no post nasal drip Neck: No JVD, no thyromegaly, no carotid bruits Lungs: no use of accessory muscles, no dullness to percussion, clear without rales or rhonchi  Cardiovascular: Rhythm regular, heart sounds  normal, no murmurs or gallops, no peripheral edema Abdomen: soft and non-tender, no hepatosplenomegaly, BS normal. Musculoskeletal: No deformities, no cyanosis or clubbing Neuro:  alert, non focal     Impression & Recommendations:  Problem # 1:  OBSTRUCTIVE SLEEP APNEA (ICD-780.57) The pathophysiology of obstructive sleep apnea, it's cardiovascular consequences and modes of treatment including CPAP were discussed with the patient in great detail.  Schedule split study with BiPAP & O2 titration as required  Compliance encouraged, wt loss emphasized, asked to avoid meds with sedative side effects - such as ativan & pain meds, cautioned against driving when sleepy.  Orders: Sleep Disorder Referral (Sleep Disorder) Pulmonary Referral (Pulmonary) Est. Patient Level IV (50539)  Problem # 2:  C O P D (ICD-496) obtain PFts given long h/o smoking ct pro -air as needed until .  Medications Added to Medication List This Visit: 1)  Metoprolol Tartrate 25 Mg Tabs (Metoprolol tartrate) .... Take 1 1/2 tablet by mouth every morning and  take 1/2 tab by mouth at bedtime 2)  Levothyroxine Sodium 200 Mcg Tabs (Levothyroxine sodium) .... Take 1 tablet by mouth once a day 3)  Proair Hfa 108 (90 Base) Mcg/act Aers (Albuterol sulfate) .... 2 puffs every 4 hours as needed 4)  Vitamin D3 2000 Unit Caps (Cholecalciferol) .... Take 1 tablet by mouth every morning 5)  Vitamin C 500 Mg Tabs (Ascorbic acid) .... Take 2 tablet by mouth once a day 6)  Omega-3 Fish Oil 1200 Mg Caps (Omega-3 fatty acids) .... Take 2 tablet by mouth every morning  Patient Instructions: 1)  Copy sent to:Dr Tawni Millers 2)  Please schedule a follow-up appointment in 1 month. 3)  You have been asked to make an appointment for a Pulmonary Function Test (Breathing Test) prior to or at the time of your next visit. Use medications as usual unless otherwise instructed. 4)  Sleep study 5)  AVOID pain pills/ sleeping pills & ATIVAN   Immunization History:  Influenza Immunization History:    Influenza:  historical (07/28/2009)  Pneumovax Immunization History:    Pneumovax:  historical (07/28/2009)   Appended Document: hfu/mhh per Huey Romans on CPAP 10  cm with 2 L O2  Appended Document: hfu/mhh moderate airwya obstruction FEV1 48%, no BD response, mild restriction, DLCO corrects for alveolar volume c/w obesity

## 2010-10-20 NOTE — Assessment & Plan Note (Signed)
Summary: 1 month follow up in HP//jwr   Visit Type:  Follow-up Copy to:  hospital Primary Provider/Referring Provider:  Dr. Esperanza Richters  CC:  Pt here for 1 month follow up.  History of Present Illness: 65 year old morbidly obese female patient with supposedly diagnosed sleep apnea, hypertension,breast cancer with left lumpectomy and hypothyroidism was admitted 5/26-02/19/10  with left lower extremity pain and swelling, course complicated by acute hypercarbic resp failure requiring BiPAP after receiving dilaudid. Duplex neg DVT, CT angio neg PE, echo >> nml LV fn, mod LVH. She was discharged on BiPAP , full face mask with 2-3 L O2 ([facility]). She was non compliant with her CPAP priro because she did not realise the 'gravity of her illness'. She smoked a PPD x 30 yrs before quitting in 2004. She uses ativan to get over th eanxiety of BiPAP but daughter finds her sitting in a chair at night , she has no memory of this. 20 mg lisinopril taken off med list- denies cough.  per [facility] on CPAP 10 cm with 2 L O2 moderate airwya obstruction FEV1 48%, no BD response, mild restriction, DLCO corrects for alveolar volume c/w obesity PSG 03/30/10 surprisingly showed AHI 0.7/h & RDI 9/h, 36 RERAs over 249 mins TST, lowest desatn 92% on 2 L [place]  April 07, 2010 4:04 PM  Hosp adm for LE cellulitis - on septra, compliant with O2 - this helps, has lost 15 lbs, reviewed PFTS, PSG data, discussed rehab, not needed rescue inhaler. Breathing well, sedentarylifestyle Did not desaturate on walking but HR climbed to 125 s/o deconditioning.    Current Medications (verified): 1)  Metoprolol Tartrate 25 Mg Tabs (Metoprolol Tartrate) .... Take 1 1/2 Tablet By Mouth Every Morning and Take 1/2 Tab By Mouth At Bedtime 2)  Klor-Con 10 10 Meq Cr-Tabs (Potassium Chloride) .... Take One Tablet Daily 3)  Levothyroxine Sodium 200 Mcg Tabs (Levothyroxine Sodium) .... Take 1 Tablet By Mouth Once A Day 4)  Cymbalta 30 Mg Cpep  (Duloxetine Hcl) .... Take Two Capsules At Bedtime 5)  Furosemide 40 Mg Tabs (Furosemide) .... Take One Tablet Daily 6)  Pravastatin Sodium 40 Mg Tabs (Pravastatin Sodium) .... Take One Tablet Daily 7)  Aspir-Low 81 Mg Tbec (Aspirin) .... Take 1 Tablet By Mouth Once A Day 8)  Hydrochlorothiazide 25 Mg Tabs (Hydrochlorothiazide) .... Take 1 Tablet By Mouth Once A Day As Needed Edema 9)  Amitriptyline Hcl 100 Mg Tabs (Amitriptyline Hcl) .... At Bedtime For Neuropathic Pain 10)  Proair Hfa 108 (90 Base) Mcg/act Aers (Albuterol Sulfate) .... 2 Puffs Every 4 Hours As Needed 11)  Vitamin D3 2000 Unit Caps (Cholecalciferol) .... Take 1 Tablet By Mouth Every Morning 12)  Vitamin C 500 Mg Tabs (Ascorbic Acid) .... Take 2 Tablet By Mouth Once A Day 13)  Omega-3 Fish Oil 1200 Mg Caps (Omega-3 Fatty Acids) .... Take 2 Tablet By Mouth Every Morning  Allergies (verified): 1)  ! Lisinopril (Lisinopril) 2)  ! Codeine  Past History:  Past Medical History: Last updated: 03/17/2009 Hyperlipidemia Hypertension Hypothyroidism Breast CA- Left-  XRT and CTX April 2005 Coronary artery disease  Social History: Last updated: 03/10/2010 Alcohol use-yes Marital Status:  Children: yes Occupation: retired Patient states former smoker. (quit 2004 1ppd)  Review of Systems       The patient complains of weight loss and dyspnea on exertion.  The patient denies anorexia, fever, weight gain, vision loss, decreased hearing, hoarseness, chest pain, syncope, peripheral edema, prolonged cough, headaches, hemoptysis,  abdominal pain, melena, hematochezia, severe indigestion/heartburn, hematuria, muscle weakness, suspicious skin lesions, difficulty walking, depression, unusual weight change, and abnormal bleeding.    Vital Signs:  Patient profile:   65 year old female Height:      66 inches Weight:      328 pounds BMI:     53.13 O2 Sat:      97 % on 2.5 L/min pulsed Temp:     98.3 degrees F oral Pulse rate:   98 /  minute BP sitting:   110 / 68  (right arm) Cuff size:   large  Vitals Entered By: Iran Planas CMA (April 07, 2010 3:59 PM)  O2 Flow:  2.5 L/min pulsed CC: Pt here for 1 month follow up Comments Medications reviewed with patient Verified contact number and pharmacy with patient Iran Planas CMA  April 07, 2010 3:59 PM    Physical Exam  Additional Exam:  Gen. Pleasant, morbidly obese, in no distress on2L pulse O2, normal affect ENT - no lesions, no post nasal drip Neck: No JVD, no thyromegaly, no carotid bruits Lungs: no use of accessory muscles, no dullness to percussion, clear without rales or rhonchi  Cardiovascular: Rhythm regular, heart sounds  normal, no murmurs or gallops, no peripheral edema Musculoskeletal: No deformities, no cyanosis or clubbing      Impression & Recommendations:  Problem # 1:  C O P D (ICD-496) Trial of spiriva to improve exercise tolerance Pulm rehab for deconditioning  Problem # 2:  OBSTRUCTIVE SLEEP APNEA (ICD-780.57) stay on CPAP 10 cm for now, predominant UARS on PSG Compliance encouraged, wt loss emphasized, asked to avoid meds with sedative side effects, cautioned against driving when sleepy.  Orders: Est. Patient Level IV (19379) DME Referral (DME) Rehabilitation Referral (Rehab)  Problem # 3:  OXYGEN-USE OF SUPPLEMENTAL (ICD-V46.2) CHk ONO on RA  Await data from pulm rehab , for destauration on exertion - ok to stay off rest for now ct O2 during exertion & sleep  - reassess in 2 m  Patient Instructions: 1)  Copy sent to: Dr Tawni Millers 2)  Please schedule a follow-up appointment in 2 months with TP. 3)  We have recommended that you participate in Pulmonary Rehabilitation Program.  You will need your exercise prescription and copy of your evaluation to be considered for participation. 4)  Check oxygen levels during sleep - off oxygen & CPAP. 5)  Continue suing CPAP for now - if you keep losing weight, you may not need this  anymore 6)  Trial of spiriva - call us if this works    Appended Document: 1 month follow up in HP//jwr ONO results received and placed in Dr. Bari Mantis "look at". jwr  Appended Document: 1 month follow up in HP//jwr stay on O2 during sleep, ONO shows significant desaturationd for > 4h  Appended Document: 1 month follow up in HP//jwr LMOMTCB x 1. jwr  Appended Document: 1 month follow up in HP//jwr Spoke with pt and notified of results/recs per RA.  Pt verbalized understanding.

## 2010-11-09 ENCOUNTER — Encounter: Payer: Self-pay | Admitting: Pulmonary Disease

## 2010-11-09 ENCOUNTER — Ambulatory Visit: Payer: Self-pay | Admitting: Pulmonary Disease

## 2010-11-26 NOTE — Miscellaneous (Signed)
Summary: Progress note/Pulmonary/[place]  Progress note/Pulmonary/[place]   Imported By: Bubba Hales 11/19/2010 10:00:06  _____________________________________________________________________  External Attachment:    Type:   Image     Comment:   External Document

## 2010-11-26 NOTE — Procedures (Signed)
Summary: 6MWT/[place]  6MWT/[place]   Imported By: Bubba Hales 11/19/2010 09:58:17  _____________________________________________________________________  External Attachment:    Type:   Image     Comment:   External Document

## 2010-11-30 ENCOUNTER — Ambulatory Visit: Payer: Self-pay | Admitting: Pulmonary Disease

## 2010-12-01 LAB — CBC
HCT: 33.8 % — ABNORMAL LOW (ref 36.0–46.0)
HCT: 38.1 % (ref 36.0–46.0)
Hemoglobin: 11.5 g/dL — ABNORMAL LOW (ref 12.0–15.0)
Hemoglobin: 12.9 g/dL (ref 12.0–15.0)
MCH: 31.5 pg (ref 26.0–34.0)
MCH: 32 pg (ref 26.0–34.0)
MCHC: 33.8 g/dL (ref 30.0–36.0)
MCHC: 33.9 g/dL (ref 30.0–36.0)
MCV: 94.6 fL (ref 78.0–100.0)
RBC: 3.58 MIL/uL — ABNORMAL LOW (ref 3.87–5.11)

## 2010-12-01 LAB — BASIC METABOLIC PANEL
CO2: 30 mEq/L (ref 19–32)
CO2: 34 mEq/L — ABNORMAL HIGH (ref 19–32)
Calcium: 8.7 mg/dL (ref 8.4–10.5)
Calcium: 9.4 mg/dL (ref 8.4–10.5)
Chloride: 102 mEq/L (ref 96–112)
GFR calc Af Amer: >60 mL/min (ref >60)
GFR calc non Af Amer: 52 mL/min — ABNORMAL LOW (ref >60)
Glucose, Bld: 113 mg/dL — ABNORMAL HIGH (ref 70–99)
Potassium: 3.7 mEq/L (ref 3.5–5.1)
Potassium: 3.9 mEq/L (ref 3.5–5.1)
Sodium: 141 mEq/L (ref 135–145)
Sodium: 143 mEq/L (ref 135–145)

## 2010-12-01 LAB — URINALYSIS, ROUTINE W REFLEX MICROSCOPIC
Bilirubin Urine: NEGATIVE
Glucose, UA: NEGATIVE mg/dL
Hgb urine dipstick: NEGATIVE
Specific Gravity, Urine: 1.017 (ref 1.005–1.030)
Urobilinogen, UA: 0.2 mg/dL (ref 0.0–1.0)

## 2010-12-01 LAB — DIFFERENTIAL
Basophils Relative: 1 % (ref 0–1)
Lymphs Abs: 2.6 10*3/uL (ref 0.7–4.0)
Monocytes Absolute: 0.6 10*3/uL (ref 0.1–1.0)
Monocytes Relative: 9 % (ref 3–12)
Neutro Abs: 3.7 10*3/uL (ref 1.7–7.7)

## 2010-12-06 LAB — CBC
HCT: 34.5 % — ABNORMAL LOW (ref 36.0–46.0)
HCT: 35.3 % — ABNORMAL LOW (ref 36.0–46.0)
Hemoglobin: 11.3 g/dL — ABNORMAL LOW (ref 12.0–15.0)
Hemoglobin: 11.6 g/dL — ABNORMAL LOW (ref 12.0–15.0)
MCH: 29 pg (ref 26.0–34.0)
MCH: 29.1 pg (ref 26.0–34.0)
MCH: 29.2 pg (ref 26.0–34.0)
MCH: 29.2 pg (ref 26.0–34.0)
MCHC: 32.5 g/dL (ref 30.0–36.0)
MCHC: 32.8 g/dL (ref 30.0–36.0)
MCHC: 33 g/dL (ref 30.0–36.0)
MCHC: 33 g/dL (ref 30.0–36.0)
MCV: 88.5 fL (ref 78.0–100.0)
MCV: 88.6 fL (ref 78.0–100.0)
MCV: 89.6 fL (ref 78.0–100.0)
Platelets: 143 10*3/uL — ABNORMAL LOW (ref 150–400)
Platelets: 146 10*3/uL — ABNORMAL LOW (ref 150–400)
Platelets: 158 10*3/uL (ref 150–400)
RBC: 3.78 MIL/uL — ABNORMAL LOW (ref 3.87–5.11)
RBC: 3.81 MIL/uL — ABNORMAL LOW (ref 3.87–5.11)
RDW: 16.3 % — ABNORMAL HIGH (ref 11.5–15.5)
WBC: 4.9 10*3/uL (ref 4.0–10.5)

## 2010-12-06 LAB — BASIC METABOLIC PANEL
BUN: 10 mg/dL (ref 6–23)
BUN: 11 mg/dL (ref 6–23)
BUN: 6 mg/dL (ref 6–23)
BUN: 8 mg/dL (ref 6–23)
CO2: 33 mEq/L — ABNORMAL HIGH (ref 19–32)
CO2: 35 mEq/L — ABNORMAL HIGH (ref 19–32)
CO2: 38 mEq/L — ABNORMAL HIGH (ref 19–32)
Calcium: 8.6 mg/dL (ref 8.4–10.5)
Calcium: 8.9 mg/dL (ref 8.4–10.5)
Calcium: 8.9 mg/dL (ref 8.4–10.5)
Calcium: 9 mg/dL (ref 8.4–10.5)
Chloride: 101 mEq/L (ref 96–112)
Chloride: 101 mEq/L (ref 96–112)
Chloride: 98 mEq/L (ref 96–112)
Creatinine, Ser: 0.87 mg/dL (ref 0.4–1.2)
Creatinine, Ser: 0.89 mg/dL (ref 0.4–1.2)
Creatinine, Ser: 0.89 mg/dL (ref 0.4–1.2)
Creatinine, Ser: 0.97 mg/dL (ref 0.4–1.2)
GFR calc Af Amer: >60 mL/min (ref >60)
GFR calc non Af Amer: 58 mL/min — ABNORMAL LOW (ref >60)
GFR calc non Af Amer: >60 mL/min (ref >60)
GFR calc non Af Amer: >60 mL/min (ref >60)
Glucose, Bld: 108 mg/dL — ABNORMAL HIGH (ref 70–99)
Glucose, Bld: 114 mg/dL — ABNORMAL HIGH (ref 70–99)
Glucose, Bld: 98 mg/dL (ref 70–99)
Potassium: 3.4 mEq/L — ABNORMAL LOW (ref 3.5–5.1)
Sodium: 141 mEq/L (ref 135–145)

## 2010-12-06 LAB — TSH: TSH: 1.209 u[IU]/mL (ref 0.350–4.500)

## 2010-12-06 LAB — URINALYSIS, ROUTINE W REFLEX MICROSCOPIC
Bilirubin Urine: NEGATIVE
Glucose, UA: NEGATIVE mg/dL
Ketones, ur: NEGATIVE mg/dL
Protein, ur: NEGATIVE mg/dL
pH: 6 (ref 5.0–8.0)

## 2010-12-06 LAB — URINE CULTURE: Colony Count: 60000

## 2010-12-06 LAB — URINE MICROSCOPIC-ADD ON

## 2010-12-06 LAB — HEMOGLOBIN A1C: Hgb A1c MFr Bld: 6.1 % — ABNORMAL HIGH (ref <5.7)

## 2010-12-06 LAB — VANCOMYCIN, TROUGH: Vancomycin Tr: 17.1 ug/mL (ref 10.0–20.0)

## 2010-12-06 LAB — CULTURE, BLOOD (ROUTINE X 2)

## 2010-12-06 LAB — DIFFERENTIAL
Basophils Relative: 0 % (ref 0–1)
Eosinophils Absolute: 0.2 10*3/uL (ref 0.0–0.7)
Eosinophils Relative: 2 % (ref 0–5)
Lymphs Abs: 1.3 10*3/uL (ref 0.7–4.0)
Neutrophils Relative %: 83 % — ABNORMAL HIGH (ref 43–77)

## 2010-12-06 LAB — T4, FREE: Free T4: 0.98 ng/dL (ref 0.80–1.80)

## 2010-12-07 LAB — CBC
HCT: 35.6 % — ABNORMAL LOW (ref 36.0–46.0)
HCT: 33.7 % — ABNORMAL LOW (ref 36.0–46.0)
HCT: 36.7 % (ref 36.0–46.0)
HCT: 43.8 % (ref 36.0–46.0)
Hemoglobin: 11.4 g/dL — ABNORMAL LOW (ref 12.0–15.0)
Hemoglobin: 10.6 g/dL — ABNORMAL LOW (ref 12.0–15.0)
Hemoglobin: 11.8 g/dL — ABNORMAL LOW (ref 12.0–15.0)
Hemoglobin: 14.1 g/dL (ref 12.0–15.0)
MCHC: 31.6 g/dL (ref 30.0–36.0)
MCHC: 32 g/dL (ref 30.0–36.0)
MCHC: 32.3 g/dL (ref 30.0–36.0)
MCV: 90.1 fL (ref 78.0–100.0)
MCV: 91.3 fL (ref 78.0–100.0)
MCV: 91.5 fL (ref 78.0–100.0)
MCV: 92.2 fL (ref 78.0–100.0)
Platelets: 130 10*3/uL — ABNORMAL LOW (ref 150–400)
Platelets: 138 10*3/uL — ABNORMAL LOW (ref 150–400)
Platelets: 143 10*3/uL — ABNORMAL LOW (ref 150–400)
RBC: 3.94 MIL/uL (ref 3.87–5.11)
RBC: 3.69 MIL/uL — ABNORMAL LOW (ref 3.87–5.11)
RBC: 3.82 MIL/uL — ABNORMAL LOW (ref 3.87–5.11)
RBC: 4.86 MIL/uL (ref 3.87–5.11)
RDW: 16.3 % — ABNORMAL HIGH (ref 11.5–15.5)
RDW: 15.8 % — ABNORMAL HIGH (ref 11.5–15.5)
RDW: 16.2 % — ABNORMAL HIGH (ref 11.5–15.5)
RDW: 16.4 % — ABNORMAL HIGH (ref 11.5–15.5)
RDW: 16.4 % — ABNORMAL HIGH (ref 11.5–15.5)
WBC: 7.9 10*3/uL (ref 4.0–10.5)

## 2010-12-07 LAB — BLOOD GAS, ARTERIAL
Acid-Base Excess: 5.4 mmol/L — ABNORMAL HIGH (ref 0.0–2.0)
Acid-Base Excess: 6.7 mmol/L — ABNORMAL HIGH (ref 0.0–2.0)
Acid-Base Excess: 7.5 mmol/L — ABNORMAL HIGH (ref 0.0–2.0)
Bicarbonate: 33.5 mEq/L — ABNORMAL HIGH (ref 20.0–24.0)
Bicarbonate: 34.1 mEq/L — ABNORMAL HIGH (ref 20.0–24.0)
Delivery systems: POSITIVE
Drawn by: 23337
Drawn by: 275531
Drawn by: 308601
Expiratory PAP: 6
FIO2: 0.3 %
FIO2: 0.4 %
Inspiratory PAP: 12
Inspiratory PAP: 16
O2 Content: 2 L/min
O2 Saturation: 95.5 %
O2 Saturation: 98.3 %
Patient temperature: 98
Patient temperature: 98.6
TCO2: 31.2 mmol/L (ref 0–100)
TCO2: 31.3 mmol/L (ref 0–100)
pCO2 arterial: 78.7 mmHg (ref 35.0–45.0)
pCO2 arterial: 81.9 mmHg (ref 35.0–45.0)
pH, Arterial: 7.369 (ref 7.350–7.400)
pO2, Arterial: 130 mmHg — ABNORMAL HIGH (ref 80.0–100.0)
pO2, Arterial: 85.3 mmHg (ref 80.0–100.0)

## 2010-12-07 LAB — LIPID PANEL: VLDL: 19 mg/dL (ref 0–40)

## 2010-12-07 LAB — BASIC METABOLIC PANEL
BUN: 13 mg/dL (ref 6–23)
BUN: 7 mg/dL (ref 6–23)
BUN: 8 mg/dL (ref 6–23)
CO2: 35 mEq/L — ABNORMAL HIGH (ref 19–32)
CO2: 34 mEq/L — ABNORMAL HIGH (ref 19–32)
CO2: 37 mEq/L — ABNORMAL HIGH (ref 19–32)
Calcium: 8.7 mg/dL (ref 8.4–10.5)
Calcium: 9 mg/dL (ref 8.4–10.5)
Calcium: 8.4 mg/dL (ref 8.4–10.5)
Calcium: 9.3 mg/dL (ref 8.4–10.5)
Chloride: 95 mEq/L — ABNORMAL LOW (ref 96–112)
Chloride: 101 mEq/L (ref 96–112)
Chloride: 103 mEq/L (ref 96–112)
Chloride: 104 mEq/L (ref 96–112)
Creatinine, Ser: 1 mg/dL (ref 0.4–1.2)
Creatinine, Ser: 1.85 mg/dL — ABNORMAL HIGH (ref 0.4–1.2)
GFR calc Af Amer: >60 mL/min (ref >60)
GFR calc Af Amer: >60 mL/min (ref >60)
GFR calc Af Amer: 33 mL/min — ABNORMAL LOW (ref >60)
GFR calc Af Amer: 59 mL/min — ABNORMAL LOW (ref >60)
GFR calc non Af Amer: 53 mL/min — ABNORMAL LOW (ref >60)
GFR calc non Af Amer: 28 mL/min — ABNORMAL LOW (ref >60)
GFR calc non Af Amer: 35 mL/min — ABNORMAL LOW (ref >60)
GFR calc non Af Amer: 49 mL/min — ABNORMAL LOW (ref >60)
Glucose, Bld: 87 mg/dL (ref 70–99)
Glucose, Bld: 98 mg/dL (ref 70–99)
Glucose, Bld: 120 mg/dL — ABNORMAL HIGH (ref 70–99)
Glucose, Bld: 92 mg/dL (ref 70–99)
Potassium: 3.1 mEq/L — ABNORMAL LOW (ref 3.5–5.1)
Potassium: 3.3 mEq/L — ABNORMAL LOW (ref 3.5–5.1)
Potassium: 3.5 mEq/L (ref 3.5–5.1)
Potassium: 3.7 mEq/L (ref 3.5–5.1)
Potassium: 4.4 mEq/L (ref 3.5–5.1)
Sodium: 141 mEq/L (ref 135–145)
Sodium: 145 mEq/L (ref 135–145)
Sodium: 137 mEq/L (ref 135–145)
Sodium: 140 mEq/L (ref 135–145)

## 2010-12-07 LAB — COMPREHENSIVE METABOLIC PANEL
ALT: 50 U/L — ABNORMAL HIGH (ref 0–35)
BUN: 8 mg/dL (ref 6–23)
CO2: 33 mEq/L — ABNORMAL HIGH (ref 19–32)
Calcium: 9 mg/dL (ref 8.4–10.5)
Creatinine, Ser: 0.92 mg/dL (ref 0.4–1.2)
GFR calc non Af Amer: >60 mL/min (ref >60)
Glucose, Bld: 87 mg/dL (ref 70–99)
Sodium: 142 mEq/L (ref 135–145)
Total Protein: 7.9 g/dL (ref 6.0–8.3)

## 2010-12-07 LAB — VANCOMYCIN, TROUGH: Vancomycin Tr: 22 ug/mL — ABNORMAL HIGH (ref 10.0–20.0)

## 2010-12-07 LAB — GLUCOSE, CAPILLARY

## 2010-12-07 LAB — DIFFERENTIAL
Eosinophils Absolute: 0.1 10*3/uL (ref 0.0–0.7)
Eosinophils Absolute: 0.2 10*3/uL (ref 0.0–0.7)
Lymphocytes Relative: 11 % — ABNORMAL LOW (ref 12–46)
Lymphs Abs: 1.2 10*3/uL (ref 0.7–4.0)
Lymphs Abs: 1.4 10*3/uL (ref 0.7–4.0)
Monocytes Relative: 4 % (ref 3–12)
Monocytes Relative: 5 % (ref 3–12)
Neutro Abs: 10.5 10*3/uL — ABNORMAL HIGH (ref 1.7–7.7)
Neutro Abs: 9.3 10*3/uL — ABNORMAL HIGH (ref 1.7–7.7)
Neutrophils Relative %: 84 % — ABNORMAL HIGH (ref 43–77)
Neutrophils Relative %: 84 % — ABNORMAL HIGH (ref 43–77)

## 2010-12-07 LAB — BRAIN NATRIURETIC PEPTIDE
Pro B Natriuretic peptide (BNP): 105 pg/mL — ABNORMAL HIGH (ref 0.0–100.0)
Pro B Natriuretic peptide (BNP): 152 pg/mL — ABNORMAL HIGH (ref 0.0–100.0)

## 2010-12-07 LAB — CARDIAC PANEL(CRET KIN+CKTOT+MB+TROPI)
CK, MB: 7.9 ng/mL (ref 0.3–4.0)
Relative Index: 3.5 — ABNORMAL HIGH (ref 0.0–2.5)
Relative Index: 4.1 — ABNORMAL HIGH (ref 0.0–2.5)
Total CK: 272 U/L — ABNORMAL HIGH (ref 7–177)
Total CK: 277 U/L — ABNORMAL HIGH (ref 7–177)
Troponin I: 1.03 ng/mL (ref 0.00–0.06)
Troponin I: 1.32 ng/mL (ref 0.00–0.06)
Troponin I: 1.85 ng/mL (ref 0.00–0.06)

## 2010-12-07 LAB — URINALYSIS, ROUTINE W REFLEX MICROSCOPIC
Bilirubin Urine: NEGATIVE
Nitrite: NEGATIVE
Specific Gravity, Urine: 1.023 (ref 1.005–1.030)
Urobilinogen, UA: 0.2 mg/dL (ref 0.0–1.0)

## 2010-12-07 LAB — CULTURE, BLOOD (ROUTINE X 2)

## 2010-12-07 LAB — D-DIMER, QUANTITATIVE: D-Dimer, Quant: 0.63 ug/mL-FEU — ABNORMAL HIGH (ref 0.00–0.48)

## 2010-12-07 LAB — SODIUM, URINE, RANDOM: Sodium, Ur: 26 mEq/L

## 2010-12-07 LAB — MRSA PCR SCREENING
MRSA by PCR: NEGATIVE
MRSA by PCR: NEGATIVE

## 2010-12-07 LAB — VITAMIN B12: Vitamin B-12: 480 pg/mL (ref 211–911)

## 2010-12-07 LAB — CREATININE, URINE, RANDOM: Creatinine, Urine: 167.2 mg/dL

## 2010-12-14 ENCOUNTER — Encounter: Payer: Self-pay | Admitting: Oncology

## 2010-12-31 LAB — BASIC METABOLIC PANEL
CO2: 37 mEq/L — ABNORMAL HIGH (ref 19–32)
Chloride: 97 mEq/L (ref 96–112)
GFR calc Af Amer: 47 mL/min — ABNORMAL LOW (ref >60)
Glucose, Bld: 95 mg/dL (ref 70–99)
Sodium: 141 mEq/L (ref 135–145)

## 2010-12-31 LAB — DIFFERENTIAL
Basophils Relative: 2 % — ABNORMAL HIGH (ref 0–1)
Eosinophils Absolute: 0.2 10*3/uL (ref 0.0–0.7)
Eosinophils Relative: 4 % (ref 0–5)
Monocytes Absolute: 0.5 10*3/uL (ref 0.1–1.0)
Monocytes Relative: 8 % (ref 3–12)

## 2010-12-31 LAB — CBC
Hemoglobin: 12.1 g/dL (ref 12.0–15.0)
MCHC: 33.6 g/dL (ref 30.0–36.0)
MCV: 93.3 fL (ref 78.0–100.0)
RBC: 3.86 MIL/uL — ABNORMAL LOW (ref 3.87–5.11)

## 2011-01-05 ENCOUNTER — Other Ambulatory Visit (HOSPITAL_COMMUNITY): Payer: Self-pay | Admitting: Oncology

## 2011-01-05 ENCOUNTER — Encounter (HOSPITAL_BASED_OUTPATIENT_CLINIC_OR_DEPARTMENT_OTHER): Payer: Medicare Other | Admitting: Oncology

## 2011-01-05 DIAGNOSIS — I10 Essential (primary) hypertension: Secondary | ICD-10-CM

## 2011-01-05 DIAGNOSIS — Z9889 Other specified postprocedural states: Secondary | ICD-10-CM

## 2011-01-05 DIAGNOSIS — Z853 Personal history of malignant neoplasm of breast: Secondary | ICD-10-CM

## 2011-01-05 DIAGNOSIS — C50919 Malignant neoplasm of unspecified site of unspecified female breast: Secondary | ICD-10-CM

## 2011-01-05 DIAGNOSIS — E785 Hyperlipidemia, unspecified: Secondary | ICD-10-CM

## 2011-01-05 LAB — CBC WITH DIFFERENTIAL/PLATELET
Eosinophils Absolute: 0.1 10*3/uL (ref 0.0–0.5)
HCT: 36.5 % (ref 34.8–46.6)
LYMPH%: 28.8 % (ref 14.0–49.7)
MCHC: 33.3 g/dL (ref 31.5–36.0)
MONO#: 0.4 10*3/uL (ref 0.1–0.9)
NEUT#: 2.5 10*3/uL (ref 1.5–6.5)
NEUT%: 57.5 % (ref 38.4–76.8)
Platelets: 184 10*3/uL (ref 145–400)
WBC: 4.3 10*3/uL (ref 3.9–10.3)

## 2011-01-05 LAB — URINALYSIS, ROUTINE W REFLEX MICROSCOPIC
Bilirubin Urine: NEGATIVE
Glucose, UA: NEGATIVE mg/dL
Hgb urine dipstick: NEGATIVE
Nitrite: NEGATIVE
Specific Gravity, Urine: 1.028 (ref 1.005–1.030)
pH: 5.5 (ref 5.0–8.0)

## 2011-01-05 LAB — DIFFERENTIAL
Basophils Absolute: 0 10*3/uL (ref 0.0–0.1)
Eosinophils Relative: 3 % (ref 0–5)
Lymphocytes Relative: 21 % (ref 12–46)
Monocytes Absolute: 0.5 10*3/uL (ref 0.1–1.0)

## 2011-01-05 LAB — BASIC METABOLIC PANEL
CO2: 34 mEq/L — ABNORMAL HIGH (ref 19–32)
GFR calc non Af Amer: 51 mL/min — ABNORMAL LOW (ref >60)
Glucose, Bld: 128 mg/dL — ABNORMAL HIGH (ref 70–99)
Potassium: 4.1 mEq/L (ref 3.5–5.1)
Sodium: 139 mEq/L (ref 135–145)

## 2011-01-05 LAB — LACTATE DEHYDROGENASE: LDH: 224 U/L (ref 94–250)

## 2011-01-05 LAB — COMPREHENSIVE METABOLIC PANEL
CO2: 32 mEq/L (ref 19–32)
Creatinine, Ser: 1.15 mg/dL (ref 0.40–1.20)
Glucose, Bld: 101 mg/dL — ABNORMAL HIGH (ref 70–99)
Total Bilirubin: 0.5 mg/dL (ref 0.3–1.2)

## 2011-01-05 LAB — CBC
HCT: 40.4 % (ref 36.0–46.0)
Hemoglobin: 13.5 g/dL (ref 12.0–15.0)
RDW: 15.2 % (ref 11.5–15.5)

## 2011-01-26 ENCOUNTER — Ambulatory Visit
Admission: RE | Admit: 2011-01-26 | Discharge: 2011-01-26 | Disposition: A | Discharge status: Home / Self Care | Payer: Medicare Other | Source: Ambulatory Visit | Attending: Oncology | Admitting: Oncology

## 2011-01-26 DIAGNOSIS — Z9889 Other specified postprocedural states: Secondary | ICD-10-CM

## 2011-02-08 ENCOUNTER — Encounter: Payer: Self-pay | Admitting: Pulmonary Disease

## 2011-02-12 ENCOUNTER — Ambulatory Visit (INDEPENDENT_AMBULATORY_CARE_PROVIDER_SITE_OTHER): Payer: Medicare Other | Admitting: Pulmonary Disease

## 2011-02-12 ENCOUNTER — Encounter: Payer: Self-pay | Admitting: Pulmonary Disease

## 2011-02-12 DIAGNOSIS — Z9981 Dependence on supplemental oxygen: Secondary | ICD-10-CM

## 2011-02-12 DIAGNOSIS — J449 Chronic obstructive pulmonary disease, unspecified: Secondary | ICD-10-CM

## 2011-02-12 DIAGNOSIS — G4730 Sleep apnea, unspecified: Secondary | ICD-10-CM

## 2011-02-12 NOTE — Progress Notes (Signed)
SATURATION QUALIFICATIONS:  Patient Saturations on Room Air at Rest = 96%  Patient Saturations on Room Air while Ambulating = 85%  Patient Saturations on 2 Liters of oxygen while Ambulating = 94%

## 2011-02-12 NOTE — Progress Notes (Signed)
  Subjective:    Patient ID: [name] [name], female    DOB: 1946/04/26, 65 y.o.   MRN: [id number]  HPI PCP -   65 year old morbidly obese female ex smoker (quit 2000) for FU of copd & OSA. PMH -sleep apnea, hypertension,breast cancer with left lumpectomy and hypothyroidism She was admitted 5/26-02/19/10  with left lower extremity pain and swelling, course complicated by acute hypercarbic resp failure requiring BiPAP after receiving dilaudid. Duplex neg DVT, CT angio neg PE, echo >> nml LV fn, mod LVH. She was discharged on BiPAP , full face mask with 2-3 L O2 ([facility]). She was non compliant with her CPAP priro because she did not realise the 'gravity of her illness'. She smoked a PPD x 30 yrs before quitting in 2004. She uses ativan to get over th eanxiety of BiPAP but daughter finds her sitting in a chair at night , she has no memory of this. 20 mg lisinopril taken off med list- denies cough.  per [facility] on CPAP 10 cm with 2 L O2 moderate airwya obstruction FEV1 48%, no BD response, mild restriction, DLCO corrects for alveolar volume c/w obesity PSG 03/30/10 surprisingly showed AHI 0.7/h & RDI 9/h, 36 RERAs over 249 mins TST, lowest desatn 92% on 2 L [place]  April 07, 2010 4:04 PM  Hosp adm for LE cellulitis - on septra, compliant with O2 - this helps, has lost 15 lbs, reviewed PFTS, PSG data, discussed rehab, not needed rescue inhaler. Breathing well, sedentarylifestyle Did not desaturate on walking but HR climbed to 125 s/o deconditioning. stay on O2 during sleep, ONO shows significant desaturationd for > 4h  02/12/2011 Annual FU for recertification of O2 Cellulitis & gout ongoing issues -  using walker O2 satns stay low - has pulse ox Uses BiPAP 'faithfully' spiriva caused coughing   Review of Systems Pt denies any significant  nasal congestion or excess secretions, fever, chills, sweats, unintended wt loss, pleuritic or exertional cp, orthopnea pnd or leg swelling.  Pt also denies any obvious  fluctuation in symptoms with weather or environmental change or other alleviating or aggravating factors.    Pt denies any increase in rescue therapy over baseline, denies waking up needing it or having early am exacerbations or coughing/wheezing/ or dyspnea       Objective:   Physical Exam Gen. Pleasant, well-nourished, in no distress ENT - no lesions, no post nasal drip, class 3 airway Neck: No JVD, no thyromegaly, no carotid bruits Lungs: no use of accessory muscles, no dullness to percussion, clear without rales or rhonchi  Cardiovascular: Rhythm regular, heart sounds  normal, no murmurs or gallops, no peripheral edema Musculoskeletal: No deformities, no cyanosis or clubbing          Assessment & Plan:

## 2011-02-12 NOTE — Patient Instructions (Signed)
Trial of symbicort 160 2 puffs daily - report back in 2 weeks RINSE mouth after use Check oxygen levels at rest & ambulation to recertify

## 2011-02-22 NOTE — Assessment & Plan Note (Signed)
Ct CPAP Weight loss encouraged, compliance with goal of at least 4-6 hrs every night is the expectation. Advised against medications with sedative side effects Cautioned against driving when sleepy - understanding that sleepiness will vary on a day to day basis  

## 2011-02-22 NOTE — Assessment & Plan Note (Signed)
Gold stg 3 FEV1 48%  Trial of symbicort - she willc all back for Rx if this works

## 2011-02-22 NOTE — Assessment & Plan Note (Signed)
Re-certified based on desaturation with exertion

## 2011-03-14 ENCOUNTER — Inpatient Hospital Stay (HOSPITAL_COMMUNITY): Payer: Medicare Other

## 2011-03-14 ENCOUNTER — Inpatient Hospital Stay (HOSPITAL_COMMUNITY)
Admission: AD | Admit: 2011-03-14 | Discharge: 2011-03-14 | Disposition: A | Discharge status: Home / Self Care | Payer: Medicare Other | Source: Ambulatory Visit | Attending: Obstetrics & Gynecology | Admitting: Obstetrics & Gynecology

## 2011-03-14 DIAGNOSIS — Z79899 Other long term (current) drug therapy: Secondary | ICD-10-CM | POA: Insufficient documentation

## 2011-03-14 DIAGNOSIS — E876 Hypokalemia: Secondary | ICD-10-CM | POA: Insufficient documentation

## 2011-03-14 DIAGNOSIS — Z7982 Long term (current) use of aspirin: Secondary | ICD-10-CM | POA: Insufficient documentation

## 2011-03-14 DIAGNOSIS — E039 Hypothyroidism, unspecified: Secondary | ICD-10-CM | POA: Insufficient documentation

## 2011-03-14 DIAGNOSIS — I10 Essential (primary) hypertension: Secondary | ICD-10-CM | POA: Insufficient documentation

## 2011-03-14 DIAGNOSIS — Z853 Personal history of malignant neoplasm of breast: Secondary | ICD-10-CM | POA: Insufficient documentation

## 2011-03-14 DIAGNOSIS — N898 Other specified noninflammatory disorders of vagina: Secondary | ICD-10-CM | POA: Insufficient documentation

## 2011-03-14 DIAGNOSIS — J4489 Other specified chronic obstructive pulmonary disease: Secondary | ICD-10-CM | POA: Insufficient documentation

## 2011-03-14 DIAGNOSIS — I252 Old myocardial infarction: Secondary | ICD-10-CM | POA: Insufficient documentation

## 2011-03-14 DIAGNOSIS — J449 Chronic obstructive pulmonary disease, unspecified: Secondary | ICD-10-CM | POA: Insufficient documentation

## 2011-03-14 LAB — CBC
HCT: 37.4 % (ref 36.0–46.0)
Hemoglobin: 12.1 g/dL (ref 12.0–15.0)
RDW: 13.6 % (ref 11.5–15.5)
WBC: 6.2 10*3/uL (ref 4.0–10.5)

## 2011-03-14 LAB — COMPREHENSIVE METABOLIC PANEL
ALT: 55 U/L — ABNORMAL HIGH (ref 0–35)
Albumin: 3.5 g/dL (ref 3.5–5.2)
Alkaline Phosphatase: 84 U/L (ref 39–117)
BUN: 8 mg/dL (ref 6–23)
Chloride: 96 mEq/L (ref 96–112)
Glucose, Bld: 116 mg/dL — ABNORMAL HIGH (ref 70–99)
Potassium: 2.8 mEq/L — ABNORMAL LOW (ref 3.5–5.1)
Total Bilirubin: 0.3 mg/dL (ref 0.3–1.2)

## 2011-03-25 ENCOUNTER — Ambulatory Visit: Payer: Medicare Other | Admitting: Pulmonary Disease

## 2011-04-30 ENCOUNTER — Ambulatory Visit: Payer: Medicare Other | Admitting: Pulmonary Disease

## 2011-05-11 ENCOUNTER — Emergency Department (HOSPITAL_COMMUNITY): Payer: Medicare Other

## 2011-05-11 ENCOUNTER — Emergency Department (HOSPITAL_COMMUNITY)
Admission: EM | Admit: 2011-05-11 | Discharge: 2011-05-11 | Disposition: A | Discharge status: Home / Self Care | Payer: Medicare Other | Attending: Emergency Medicine | Admitting: Emergency Medicine

## 2011-05-11 DIAGNOSIS — I252 Old myocardial infarction: Secondary | ICD-10-CM | POA: Insufficient documentation

## 2011-05-11 DIAGNOSIS — M7989 Other specified soft tissue disorders: Secondary | ICD-10-CM | POA: Insufficient documentation

## 2011-05-11 DIAGNOSIS — Z853 Personal history of malignant neoplasm of breast: Secondary | ICD-10-CM | POA: Insufficient documentation

## 2011-05-11 DIAGNOSIS — M79609 Pain in unspecified limb: Secondary | ICD-10-CM | POA: Insufficient documentation

## 2011-05-11 DIAGNOSIS — E039 Hypothyroidism, unspecified: Secondary | ICD-10-CM | POA: Insufficient documentation

## 2011-05-11 DIAGNOSIS — R0602 Shortness of breath: Secondary | ICD-10-CM | POA: Insufficient documentation

## 2011-05-11 DIAGNOSIS — I10 Essential (primary) hypertension: Secondary | ICD-10-CM | POA: Insufficient documentation

## 2011-05-11 LAB — DIFFERENTIAL
Basophils Absolute: 0 10*3/uL (ref 0.0–0.1)
Eosinophils Relative: 3 % (ref 0–5)
Lymphocytes Relative: 30 % (ref 12–46)
Monocytes Absolute: 0.5 10*3/uL (ref 0.1–1.0)
Monocytes Relative: 8 % (ref 3–12)

## 2011-05-11 LAB — CBC
HCT: 35.3 % — ABNORMAL LOW (ref 36.0–46.0)
MCH: 29.3 pg (ref 26.0–34.0)
MCHC: 32 g/dL (ref 30.0–36.0)
MCV: 91.5 fL (ref 78.0–100.0)
RDW: 14.7 % (ref 11.5–15.5)

## 2011-05-11 LAB — BASIC METABOLIC PANEL
BUN: 13 mg/dL (ref 6–23)
Calcium: 10.4 mg/dL (ref 8.4–10.5)
Creatinine, Ser: 0.88 mg/dL (ref 0.50–1.10)
GFR calc Af Amer: >60 mL/min (ref >60)
GFR calc non Af Amer: >60 mL/min (ref >60)
Glucose, Bld: 116 mg/dL — ABNORMAL HIGH (ref 70–99)
Potassium: 3.1 mEq/L — ABNORMAL LOW (ref 3.5–5.1)

## 2011-05-11 LAB — POCT I-STAT TROPONIN I: Troponin i, poc: 0.01 ng/mL (ref 0.00–0.08)

## 2011-05-27 ENCOUNTER — Ambulatory Visit: Payer: Medicare Other | Admitting: Pulmonary Disease

## 2011-05-31 ENCOUNTER — Emergency Department (HOSPITAL_COMMUNITY)
Admission: EM | Admit: 2011-05-31 | Discharge: 2011-05-31 | Disposition: A | Discharge status: Home / Self Care | Payer: Medicare Other | Attending: Emergency Medicine | Admitting: Emergency Medicine

## 2011-05-31 DIAGNOSIS — L02419 Cutaneous abscess of limb, unspecified: Secondary | ICD-10-CM | POA: Insufficient documentation

## 2011-05-31 DIAGNOSIS — E039 Hypothyroidism, unspecified: Secondary | ICD-10-CM | POA: Insufficient documentation

## 2011-05-31 DIAGNOSIS — Z9981 Dependence on supplemental oxygen: Secondary | ICD-10-CM | POA: Insufficient documentation

## 2011-05-31 DIAGNOSIS — I10 Essential (primary) hypertension: Secondary | ICD-10-CM | POA: Insufficient documentation

## 2011-05-31 DIAGNOSIS — I872 Venous insufficiency (chronic) (peripheral): Secondary | ICD-10-CM | POA: Insufficient documentation

## 2011-05-31 DIAGNOSIS — Z853 Personal history of malignant neoplasm of breast: Secondary | ICD-10-CM | POA: Insufficient documentation

## 2011-05-31 DIAGNOSIS — I252 Old myocardial infarction: Secondary | ICD-10-CM | POA: Insufficient documentation

## 2011-05-31 LAB — CBC
Hemoglobin: 11.6 g/dL — ABNORMAL LOW (ref 12.0–15.0)
MCH: 29.3 pg (ref 26.0–34.0)
MCHC: 31.5 g/dL (ref 30.0–36.0)
MCV: 92.9 fL (ref 78.0–100.0)
Platelets: 175 10*3/uL (ref 150–400)

## 2011-05-31 LAB — BASIC METABOLIC PANEL
Calcium: 9.2 mg/dL (ref 8.4–10.5)
Creatinine, Ser: 0.75 mg/dL (ref 0.50–1.10)
GFR calc non Af Amer: >60 mL/min (ref >60)
Glucose, Bld: 88 mg/dL (ref 70–99)
Sodium: 138 mEq/L (ref 135–145)

## 2011-05-31 LAB — DIFFERENTIAL
Basophils Relative: 1 % (ref 0–1)
Eosinophils Absolute: 0.3 10*3/uL (ref 0.0–0.7)
Lymphs Abs: 1.8 10*3/uL (ref 0.7–4.0)
Monocytes Absolute: 0.4 10*3/uL (ref 0.1–1.0)
Monocytes Relative: 7 % (ref 3–12)

## 2011-06-04 ENCOUNTER — Encounter: Payer: Self-pay | Admitting: Internal Medicine

## 2011-06-04 DIAGNOSIS — Z0001 Encounter for general adult medical examination with abnormal findings: Secondary | ICD-10-CM | POA: Insufficient documentation

## 2011-06-04 DIAGNOSIS — Z0000 Encounter for general adult medical examination without abnormal findings: Secondary | ICD-10-CM

## 2011-06-08 ENCOUNTER — Encounter: Payer: Self-pay | Admitting: Internal Medicine

## 2011-06-08 ENCOUNTER — Ambulatory Visit (INDEPENDENT_AMBULATORY_CARE_PROVIDER_SITE_OTHER): Payer: Medicare Other | Admitting: Internal Medicine

## 2011-06-08 VITALS — BP 124/70 | Temp 98.4°F | Wt 358.5 lb

## 2011-06-08 DIAGNOSIS — J45909 Unspecified asthma, uncomplicated: Secondary | ICD-10-CM

## 2011-06-08 DIAGNOSIS — L03119 Cellulitis of unspecified part of limb: Secondary | ICD-10-CM

## 2011-06-08 DIAGNOSIS — R609 Edema, unspecified: Secondary | ICD-10-CM

## 2011-06-08 DIAGNOSIS — I10 Essential (primary) hypertension: Secondary | ICD-10-CM

## 2011-06-08 DIAGNOSIS — F329 Major depressive disorder, single episode, unspecified: Secondary | ICD-10-CM

## 2011-06-08 DIAGNOSIS — Z23 Encounter for immunization: Secondary | ICD-10-CM

## 2011-06-08 MED ORDER — LEVOTHYROXINE SODIUM 200 MCG PO TABS: ORAL_TABLET | ORAL | Status: DC

## 2011-06-08 MED ORDER — LEVOTHYROXINE SODIUM 175 MCG PO TABS: ORAL_TABLET | ORAL | Status: DC

## 2011-06-08 MED ORDER — POTASSIUM CHLORIDE 10 MEQ PO TBCR: 10.0000 meq | EXTENDED_RELEASE_TABLET | Freq: Every day | ORAL | Status: DC

## 2011-06-10 ENCOUNTER — Other Ambulatory Visit: Payer: Self-pay

## 2011-06-10 MED ORDER — ALLOPURINOL 300 MG PO TABS: 300.0000 mg | ORAL_TABLET | Freq: Every day | ORAL | Status: DC

## 2011-06-13 ENCOUNTER — Encounter: Payer: Self-pay | Admitting: Internal Medicine

## 2011-06-13 DIAGNOSIS — M109 Gout, unspecified: Secondary | ICD-10-CM | POA: Insufficient documentation

## 2011-06-13 DIAGNOSIS — F32A Depression, unspecified: Secondary | ICD-10-CM | POA: Insufficient documentation

## 2011-06-13 DIAGNOSIS — C50919 Malignant neoplasm of unspecified site of unspecified female breast: Secondary | ICD-10-CM | POA: Insufficient documentation

## 2011-06-13 DIAGNOSIS — L03115 Cellulitis of right lower limb: Secondary | ICD-10-CM | POA: Insufficient documentation

## 2011-06-13 DIAGNOSIS — F329 Major depressive disorder, single episode, unspecified: Secondary | ICD-10-CM | POA: Insufficient documentation

## 2011-06-13 DIAGNOSIS — K635 Polyp of colon: Secondary | ICD-10-CM | POA: Insufficient documentation

## 2011-06-13 DIAGNOSIS — J45909 Unspecified asthma, uncomplicated: Secondary | ICD-10-CM | POA: Insufficient documentation

## 2011-06-13 DIAGNOSIS — G629 Polyneuropathy, unspecified: Secondary | ICD-10-CM

## 2011-06-13 DIAGNOSIS — E662 Morbid (severe) obesity with alveolar hypoventilation: Secondary | ICD-10-CM

## 2011-06-13 DIAGNOSIS — R609 Edema, unspecified: Secondary | ICD-10-CM | POA: Insufficient documentation

## 2011-06-13 DIAGNOSIS — M5412 Radiculopathy, cervical region: Secondary | ICD-10-CM

## 2011-06-13 HISTORY — DX: Morbid (severe) obesity with alveolar hypoventilation: E66.2

## 2011-06-13 HISTORY — DX: Polyneuropathy, unspecified: G62.9

## 2011-06-13 HISTORY — DX: Depression, unspecified: F32.A

## 2011-06-13 HISTORY — DX: Radiculopathy, cervical region: M54.12

## 2011-06-13 NOTE — Assessment & Plan Note (Signed)
stable overall by hx and exam, , and pt to continue medical treatment as before

## 2011-06-13 NOTE — Progress Notes (Signed)
Subjective:    Patient ID: [name] [name], female    DOB: January 03, 1946, 65 y.o.   MRN: [id number]  HPI  Here to establish as new pt;  Has complicated hx, chronic pain and disabled due to c-spine DDD s/p fusion 2005;  Has chronic LLE lymphedema as well with tendency towards recurrent cellulitis, none today after recent tx per [facility] with doxy course.  No DVT found at that time.   Pt denies fever, wt loss, night sweats, loss of appetite, or other constitutional symptoms  Pt denies chest pain, increased sob or doe, wheezing, orthopnea, PND, increased LE swelling, palpitations, dizziness or syncope.  Pt denies new neurological symptoms such as new headache, or facial or extremity weakness or numbness   Pt denies polydipsia, polyuria  Pt states overall good compliance with meds, trying to follow lower cholesterol diet, wt overall stable but little exercise however due to mult med problems.  Needs several refills today. Past Medical History  Diagnosis Date  . Other and unspecified hyperlipidemia   . Unspecified essential hypertension   . Breast cancer   . Anemia   . CAD (coronary artery disease)   . OSA on CPAP   . Hypothyroidism   . Peripheral edema 06/13/2011  . Asthma 06/13/2011  . Depression 06/13/2011  . Neuropathy 06/13/2011  . Cervical radiculopathy 06/13/2011  . Gout 06/13/2011  . Obesity hypoventilation syndrome 06/13/2011   Past Surgical History  Procedure Date  . Ovarian cyst removal   . Back surgery   . Breast lumpectomy     reports that she quit smoking about 12 years ago. Her smoking use included Cigarettes. She has a 20 pack-year smoking history. She has never used smokeless tobacco. She reports that she drinks alcohol. Her drug history not on file. family history includes Breast cancer in an unspecified family member and Uterine cancer in her mother. Allergies  Allergen Reactions  . Codeine     REACTION: unknown  . Lisinopril     REACTION: cough   Current Outpatient Prescriptions  on File Prior to Visit  Medication Sig Dispense Refill  . albuterol (PROAIR HFA) 108 (90 BASE) MCG/ACT inhaler Inhale 2 puffs into the lungs every 6 (six) hours as needed.        [name] amitriptyline (ELAVIL) 100 MG tablet Take 100 mg by mouth at bedtime.        [name] aspirin 81 MG tablet Take 81 mg by mouth daily.        . Cholecalciferol (VITAMIN D3) 2000 UNITS TABS Take 1 tablet by mouth daily.        [name] dextromethorphan-guaiFENesin (MUCINEX DM) 30-600 MG per 12 hr tablet Take 1 tablet by mouth every 12 (twelve) hours.        . DULoxetine (CYMBALTA) 30 MG capsule Take 2 at bedtime       . fish oil-omega-3 fatty acids 1000 MG capsule Take 2 g by mouth daily.        . furosemide (LASIX) 40 MG tablet Take 40 mg by mouth daily.        . metoprolol succinate (TOPROL-XL) 25 MG 24 hr tablet 1 in the morning and 1/2 at bedtime      . pravastatin (PRAVACHOL) 40 MG tablet Take 40 mg by mouth daily.        . vitamin C (ASCORBIC ACID) 500 MG tablet Take 500 mg by mouth daily.         Review of Systems Review of Systems  Constitutional:  Negative for diaphoresis and unexpected weight change.  HENT: Negative for drooling and tinnitus.   Eyes: Negative for photophobia and visual disturbance.  Respiratory: Negative for choking and stridor.   Gastrointestinal: Negative for vomiting and blood in stool.  Genitourinary: Negative for hematuria and decreased urine volume.  Musculoskeletal: Negative for gait problem.  Skin: Negative for color change and wound.  Neurological: Negative for tremors and numbness.  Psychiatric/Behavioral: Negative for decreased concentration. The patient is not hyperactive.       Objective:   Physical Exam Physical Exam  VS noted, on supp o2, morbid obese, not acutely ill appearing Constitutional: Pt appears well-developed and well-nourished.  HENT: Head: Normocephalic.  Right Ear: External ear normal.  Left Ear: External ear normal.  Eyes: Conjunctivae and EOM are normal. Pupils are  equal, round, and reactive to light.  Neck: Normal range of motion. Neck supple.  Cardiovascular: Normal rate and regular rhythm.   Pulmonary/Chest: Effort normal and breath sounds decreased bilat, no wheezing or rales.  Abd:  Soft, NT, non-distended, + BS Neurological: Pt is alert. No cranial nerve deficit.  Skin: Skin is warm. No erythema.  LLE with 2+ chronic Lymphedema wtihout erythema, weepiness, ulcer, drainage Psychiatric: Pt behavior is normal. Thought content normal. 1+ nervous       Assessment & Plan:

## 2011-06-13 NOTE — Patient Instructions (Addendum)
Continue all other medications as before Please return in 6 months, or sooner if needed

## 2011-06-13 NOTE — Assessment & Plan Note (Signed)
Resolved, no further antibx needed,  to f/u any worsening symptoms or concerns

## 2011-06-13 NOTE — Assessment & Plan Note (Signed)
stable overall by hx and exam, most recent data reviewed with pt, and pt to continue medical treatment as before  SpO2 Readings from Last 3 Encounters:  02/12/11 96%  04/07/10 97%  03/10/10 92%

## 2011-06-13 NOTE — Assessment & Plan Note (Signed)
Chronic stable, Continue all other medications as before

## 2011-06-13 NOTE — Assessment & Plan Note (Signed)
stable overall by hx and exam, most recent data reviewed with pt, and pt to continue medical treatment as before  BP Readings from Last 3 Encounters:  06/08/11 124/70  02/12/11 124/86  04/24/10 110/64

## 2011-06-16 ENCOUNTER — Encounter: Payer: Self-pay | Admitting: Pulmonary Disease

## 2011-06-18 ENCOUNTER — Encounter: Payer: Self-pay | Admitting: Pulmonary Disease

## 2011-06-18 ENCOUNTER — Ambulatory Visit (INDEPENDENT_AMBULATORY_CARE_PROVIDER_SITE_OTHER): Payer: Medicare Other | Admitting: Pulmonary Disease

## 2011-06-18 VITALS — BP 112/82 | HR 107 | Temp 97.8°F | Ht 65.5 in | Wt 355.8 lb

## 2011-06-18 DIAGNOSIS — Z23 Encounter for immunization: Secondary | ICD-10-CM

## 2011-06-18 DIAGNOSIS — J449 Chronic obstructive pulmonary disease, unspecified: Secondary | ICD-10-CM

## 2011-06-18 DIAGNOSIS — G4730 Sleep apnea, unspecified: Secondary | ICD-10-CM

## 2011-06-18 NOTE — Patient Instructions (Signed)
Pneumovax today Stay on CPAP every night

## 2011-06-18 NOTE — Progress Notes (Signed)
  Subjective:    Patient ID: [name] [name], female    DOB: [birth year], 65 y.o.   MRN: [id number]  HPI PCP - Jenny Reichmann 65 year old morbidly obese female ex smoker (quit 2000) for FU of copd & OSA.  PMH -sleep apnea, hypertension,breast cancer with left lumpectomy and hypothyroidism  She was admitted 5/26-02/19/10 with left lower extremity pain and swelling, course complicated by acute hypercarbic resp failure requiring BiPAP after receiving dilaudid.  Duplex neg DVT, CT angio neg PE, echo >> nml LV fn, mod LVH.  She was discharged on CPAP 10 , full face mask with 2-3 L O2 ([facility]). She smoked a PPD x 30 yrs before quitting in 2004.  moderate airway obstruction FEV1 48%, no BD response, mild restriction, DLCO corrects for alveolar volume c/w obesity  PSG 03/30/10 surprisingly showed AHI 0.7/h & RDI 9/h, 36 RERAs over 249 mins TST, lowest desatn 92% on 2 L [place]   July 2011 -ONO shows significant desaturation for > 4h   02/12/2011 Annual FU for recertification of O2  Cellulitis & gout ongoing issues - using walker  O2 satns stay low - has pulse ox  Uses BiPAP 'faithfully'  spiriva caused coughing  06/18/2011 C/o episodic dyspnea lasting few mins - goes away if she calms down & takes deep breaths Off ativan now Compliant with CPAP, not needing rescue inhaler, able to stay off O2 at rest   Review of Systems Pt denies any significant  nasal congestion or excess secretions, fever, chills, sweats, unintended wt loss, pleuritic or exertional cp, orthopnea pnd or leg swelling.  Pt also denies any obvious fluctuation in symptoms with weather or environmental change or other alleviating or aggravating factors.    Pt denies any increase in rescue therapy over baseline, denies waking up needing it or having early am exacerbations or coughing/wheezing/ or dyspnea      Objective:   Physical Exam Gen. Pleasant, obese, in no distress ENT - no lesions, no post nasal drip Neck: No JVD, no thyromegaly, no carotid  bruits Lungs: no use of accessory muscles, no dullness to percussion, decreased without rales or rhonchi  Cardiovascular: Rhythm regular, heart sounds  normal, no murmurs or gallops, no peripheral edema Musculoskeletal: No deformities, no cyanosis or clubbing , no tremors        Assessment & Plan:

## 2011-06-18 NOTE — Assessment & Plan Note (Signed)
Ct meds & o2, was recertified 5/67 Pneumovax today

## 2011-06-18 NOTE — Assessment & Plan Note (Signed)
Ct CPAP 10 cm , full face Weight loss encouraged, compliance with goal of at least 4-6 hrs every night is the expectation. Advised against medications with sedative side effects Cautioned against driving when sleepy - understanding that sleepiness will vary on a day to day basis

## 2011-09-03 ENCOUNTER — Other Ambulatory Visit: Payer: Self-pay | Admitting: *Deleted

## 2011-09-03 ENCOUNTER — Other Ambulatory Visit: Payer: Self-pay | Admitting: Internal Medicine

## 2011-09-03 MED ORDER — FUROSEMIDE 40 MG PO TABS: 80.0000 mg | ORAL_TABLET | Freq: Every day | ORAL | Status: DC

## 2011-11-04 ENCOUNTER — Other Ambulatory Visit: Payer: Self-pay

## 2011-11-04 MED ORDER — DULOXETINE HCL 30 MG PO CPEP: 30.0000 mg | ORAL_CAPSULE | Freq: Two times a day (BID) | ORAL | Status: DC

## 2011-11-05 ENCOUNTER — Other Ambulatory Visit: Payer: Self-pay | Admitting: Internal Medicine

## 2011-11-09 ENCOUNTER — Other Ambulatory Visit: Payer: Self-pay

## 2011-11-09 MED ORDER — DULOXETINE HCL 30 MG PO CPEP: 30.0000 mg | ORAL_CAPSULE | Freq: Two times a day (BID) | ORAL | Status: DC

## 2011-12-07 ENCOUNTER — Ambulatory Visit: Payer: Medicare Other | Admitting: Internal Medicine

## 2011-12-20 ENCOUNTER — Telehealth: Payer: Self-pay | Admitting: *Deleted

## 2011-12-20 MED ORDER — AMITRIPTYLINE HCL 100 MG PO TABS: 100.0000 mg | ORAL_TABLET | Freq: Every day | ORAL | Status: DC

## 2011-12-20 NOTE — Telephone Encounter (Signed)
Left msg on triage have appt schedule for 12/29/11. Needing a 14 day supply on her amitriptyline sent to [facility] because will be out before appt... 12/20/11@5 :02pm/LMB

## 2011-12-20 NOTE — Telephone Encounter (Signed)
Done to [facility]

## 2011-12-21 NOTE — Telephone Encounter (Signed)
Patient informed. 

## 2011-12-28 ENCOUNTER — Encounter: Payer: Self-pay | Admitting: Internal Medicine

## 2011-12-28 ENCOUNTER — Other Ambulatory Visit (INDEPENDENT_AMBULATORY_CARE_PROVIDER_SITE_OTHER): Payer: Medicare Other

## 2011-12-28 ENCOUNTER — Ambulatory Visit (INDEPENDENT_AMBULATORY_CARE_PROVIDER_SITE_OTHER): Payer: Medicare Other | Admitting: Internal Medicine

## 2011-12-28 VITALS — BP 120/78 | HR 99 | Temp 97.6°F | Ht 65.5 in | Wt 355.5 lb

## 2011-12-28 DIAGNOSIS — J449 Chronic obstructive pulmonary disease, unspecified: Secondary | ICD-10-CM

## 2011-12-28 DIAGNOSIS — J4489 Other specified chronic obstructive pulmonary disease: Secondary | ICD-10-CM

## 2011-12-28 DIAGNOSIS — R609 Edema, unspecified: Secondary | ICD-10-CM | POA: Diagnosis not present

## 2011-12-28 DIAGNOSIS — F32A Depression, unspecified: Secondary | ICD-10-CM

## 2011-12-28 DIAGNOSIS — E785 Hyperlipidemia, unspecified: Secondary | ICD-10-CM

## 2011-12-28 DIAGNOSIS — I10 Essential (primary) hypertension: Secondary | ICD-10-CM | POA: Diagnosis not present

## 2011-12-28 DIAGNOSIS — R600 Localized edema: Secondary | ICD-10-CM

## 2011-12-28 DIAGNOSIS — F329 Major depressive disorder, single episode, unspecified: Secondary | ICD-10-CM | POA: Diagnosis not present

## 2011-12-28 LAB — HEPATIC FUNCTION PANEL
AST: 91 U/L — ABNORMAL HIGH (ref 0–37)
Albumin: 4.2 g/dL (ref 3.5–5.2)
Total Bilirubin: 0.4 mg/dL (ref 0.3–1.2)

## 2011-12-28 LAB — CBC WITH DIFFERENTIAL/PLATELET
Basophils Relative: 0.2 % (ref 0.0–3.0)
Eosinophils Relative: 3.3 % (ref 0.0–5.0)
HCT: 40.3 % (ref 36.0–46.0)
Hemoglobin: 13 g/dL (ref 12.0–15.0)
Lymphs Abs: 1.5 10*3/uL (ref 0.7–4.0)
MCV: 92.5 fl (ref 78.0–100.0)
Monocytes Absolute: 0.4 10*3/uL (ref 0.1–1.0)
Monocytes Relative: 9.5 % (ref 3.0–12.0)
Neutro Abs: 2.6 10*3/uL (ref 1.4–7.7)
Platelets: 166 10*3/uL (ref 150.0–400.0)
RBC: 4.36 Mil/uL (ref 3.87–5.11)
WBC: 4.7 10*3/uL (ref 4.5–10.5)

## 2011-12-28 LAB — BASIC METABOLIC PANEL
BUN: 15 mg/dL (ref 6–23)
Calcium: 9.3 mg/dL (ref 8.4–10.5)
GFR: 78.68 mL/min (ref >60.00)
Glucose, Bld: 99 mg/dL (ref 70–99)
Potassium: 4 mEq/L (ref 3.5–5.1)

## 2011-12-28 LAB — LIPID PANEL
HDL: 46.3 mg/dL (ref >39.00)
LDL Cholesterol: 96 mg/dL (ref 0–99)
VLDL: 31.8 mg/dL (ref 0.0–40.0)

## 2011-12-28 LAB — TSH: TSH: 10.42 u[IU]/mL — ABNORMAL HIGH (ref 0.35–5.50)

## 2011-12-28 NOTE — Patient Instructions (Addendum)
Continue all other medications as before Please go to LAB in the Basement for the blood and/or urine tests to be done today You will be contacted by phone if any changes need to be made immediately.  Otherwise, you will receive a letter about your results with an explanation. Please return in 6 months, or sooner if needed

## 2011-12-29 ENCOUNTER — Telehealth: Payer: Self-pay

## 2011-12-29 ENCOUNTER — Other Ambulatory Visit: Payer: Self-pay | Admitting: Internal Medicine

## 2011-12-29 ENCOUNTER — Encounter: Payer: Self-pay | Admitting: Internal Medicine

## 2011-12-29 MED ORDER — LEVOTHYROXINE SODIUM 200 MCG PO TABS: ORAL_TABLET | ORAL | Status: DC

## 2011-12-29 NOTE — Telephone Encounter (Signed)
Medication sent to [facility] per patient request.

## 2012-01-02 ENCOUNTER — Encounter: Payer: Self-pay | Admitting: Internal Medicine

## 2012-01-02 NOTE — Assessment & Plan Note (Signed)
stable overall by hx and exam, most recent data reviewed with pt, and pt to continue medical treatment as before  SpO2 Readings from Last 3 Encounters:  12/28/11 98%  06/18/11 95%  02/12/11 96%

## 2012-01-02 NOTE — Assessment & Plan Note (Signed)
stable overall by hx and exam, most recent data reviewed with pt, and pt to continue medical treatment as before  BP Readings from Last 3 Encounters:  12/28/11 120/78  06/18/11 112/82  06/08/11 124/70

## 2012-01-02 NOTE — Assessment & Plan Note (Signed)
stable overall by hx and exam, most recent data reviewed with pt, and pt to continue medical treatment as before Lab Results  Component Value Date   LDLCALC 96 12/28/2011

## 2012-01-02 NOTE — Assessment & Plan Note (Signed)
stable overall by hx and exam, most recent data reviewed with pt, and pt to continue medical treatment as before  Lab Results  Component Value Date   WBC 4.7 12/28/2011   HGB 13.0 12/28/2011   HCT 40.3 12/28/2011   PLT 166.0 12/28/2011   GLUCOSE 99 12/28/2011   CHOL 174 12/28/2011   TRIG 159.0* 12/28/2011   HDL 46.30 12/28/2011   LDLCALC 96 12/28/2011   ALT 93* 12/28/2011   AST 91* 12/28/2011   NA 145 12/28/2011   K 4.0 12/28/2011   CL 100 12/28/2011   CREATININE 0.9 12/28/2011   BUN 15 12/28/2011   CO2 33* 12/28/2011   TSH 10.42* 12/28/2011   INR 1.0 11/18/2008   HGBA1C  Value: 6.1 (NOTE)                                                                       According to the ADA Clinical Practice Recommendations for 2011, when HbA1c is used as a screening test:   >=6.5%   Diagnostic of Diabetes Mellitus           (if abnormal result  is confirmed)  5.7-6.4%   Increased risk of developing Diabetes Mellitus  References:Diagnosis and Classification of Diabetes Mellitus,Diabetes AEWY,5749,35(LEZVG 1):S62-S69 and Standards of Medical Care in         Diabetes - 2011,Diabetes Care,2011,34  (Suppl 1):S11-S61.* 03/20/2010

## 2012-01-02 NOTE — Assessment & Plan Note (Signed)
Stable to improved, Continue all other medications as before,  to f/u any worsening symptoms or concerns

## 2012-01-02 NOTE — Progress Notes (Signed)
Subjective:    Patient ID: [name] [name], female    DOB: August 25, 1946, 66 y.o.   MRN: [id number]  HPI  Here to f/u; overall doing ok,  Pt denies chest pain, increased sob or doe, wheezing, orthopnea, PND, increased LE swelling (in fact some improved lately), and no palpitations, dizziness or syncope.  Pt denies new neurological symptoms such as new headache, or facial or extremity weakness or numbness    Pt denies polydipsia, polyuria,.  Pt states overall good compliance with meds, trying to follow lower cholesterol diet, wt overall stable but little exercise however.   Denies worsening depressive symptoms, suicidal ideation, or panic, though has ongoing anxiety, not increased recently. No recurrent cellulitis since mid 2012.   Pt denies fever, wt loss, night sweats, loss of appetite, or other constitutional symptoms Past Medical History  Diagnosis Date  . Other and unspecified hyperlipidemia   . Unspecified essential hypertension   . Breast cancer   . Anemia   . CAD (coronary artery disease)   . OSA on CPAP   . Hypothyroidism   . Peripheral edema 06/13/2011  . Asthma 06/13/2011  . Depression 06/13/2011  . Neuropathy 06/13/2011  . Cervical radiculopathy 06/13/2011  . Gout 06/13/2011  . Obesity hypoventilation syndrome 06/13/2011   Past Surgical History  Procedure Date  . Ovarian cyst removal   . Back surgery   . Breast lumpectomy     reports that she quit smoking about 13 years ago. Her smoking use included Cigarettes. She has a 20 pack-year smoking history. She has never used smokeless tobacco. She reports that she drinks alcohol. Her drug history not on file. family history includes Breast cancer in an unspecified family member and Uterine cancer in her mother. Allergies  Allergen Reactions  . Codeine     REACTION: unknown  . Lisinopril     REACTION: cough   Current Outpatient Prescriptions on File Prior to Visit  Medication Sig Dispense Refill  . albuterol (PROAIR HFA) 108 (90 BASE)  MCG/ACT inhaler Inhale 2 puffs into the lungs every 6 (six) hours as needed.        [name] allopurinol (ZYLOPRIM) 300 MG tablet Take 1 tablet (300 mg total) by mouth daily.  30 tablet  1  . amitriptyline (ELAVIL) 100 MG tablet Take 1 tablet (100 mg total) by mouth at bedtime.  30 tablet  0  . aspirin 81 MG tablet Take 81 mg by mouth daily.        . Cholecalciferol (VITAMIN D3) 2000 UNITS TABS Take 1 tablet by mouth daily.        [name] dextromethorphan-guaiFENesin (MUCINEX DM) 30-600 MG per 12 hr tablet Take 1 tablet by mouth every 12 (twelve) hours.        . DULoxetine (CYMBALTA) 30 MG capsule Take 1 capsule (30 mg total) by mouth 2 (two) times daily. Take 2 at bedtime  180 capsule  1  . fish oil-omega-3 fatty acids 1000 MG capsule Take 2 g by mouth daily.        . furosemide (LASIX) 40 MG tablet Take 2 tablets (80 mg total) by mouth daily.  180 tablet  1  . metoprolol succinate (TOPROL-XL) 25 MG 24 hr tablet TAKE 1 TABLET TWICE A DAY  180 tablet  2  . potassium chloride (KLOR-CON) 10 MEQ CR tablet Take 1 tablet (10 mEq total) by mouth daily.  90 tablet  3  . pravastatin (PRAVACHOL) 40 MG tablet TAKE 1 TABLET DAILY  90 tablet  2  . vitamin C (ASCORBIC ACID) 500 MG tablet Take 500 mg by mouth daily.        [name] levothyroxine (SYNTHROID, LEVOTHROID) 200 MCG tablet Take 2 pills per day by mouth  180 tablet  3   Review of Systems Review of Systems  Constitutional: Negative for diaphoresis and unexpected weight change. [name]  Respiratory: Negative for choking and stridor.   Gastrointestinal: Negative for vomiting and blood in stool.  Genitourinary: Negative for hematuria and decreased urine volume.  Musculoskeletal: Negative for gait problem.  Skin: Negative for color change and wound.  Neurological: Negative for tremors and numbness.  Psychiatric/Behavioral: Negative for decreased concentration. The patient is not hyperactive.       Objective:   Physical Exam BP 120/78  Pulse 99  Temp(Src) 97.6 F (36.4  C) (Oral)  Ht 5' 5.5" (1.664 m)  Wt 355 lb 8 oz (161.254 kg)  BMI 58.26 kg/m2  SpO2 98% Physical Exam  VS noted Constitutional: Pt appears well-developed and well-nourished.  HENT: Head: Normocephalic.  Right Ear: External ear normal.  Left Ear: External ear normal.  Eyes: Conjunctivae and EOM are normal. Pupils are equal, round, and reactive to light.  Neck: Normal range of motion. Neck supple.  Cardiovascular: Normal rate and regular rhythm.   Pulmonary/Chest: Effort normal and breath sounds normal.  Abd:  Soft, NT, non-distended, + BS Neurological: Pt is alert. No cranial nerve deficit.  Skin: Skin is warm. No erythema. 1-2+ chronic edema stable Psychiatric: Pt behavior is normal. Thought content normal.         Assessment & Plan:

## 2012-01-27 ENCOUNTER — Ambulatory Visit: Payer: Medicare Other | Admitting: Pulmonary Disease

## 2012-02-07 ENCOUNTER — Other Ambulatory Visit: Payer: Self-pay | Admitting: Internal Medicine

## 2012-02-18 ENCOUNTER — Other Ambulatory Visit: Payer: Self-pay

## 2012-02-18 MED ORDER — LEVOTHYROXINE SODIUM 200 MCG PO TABS: ORAL_TABLET | ORAL | Status: DC

## 2012-02-18 MED ORDER — AMITRIPTYLINE HCL 100 MG PO TABS: 100.0000 mg | ORAL_TABLET | Freq: Every day | ORAL | Status: DC

## 2012-02-19 HISTORY — PX: CERVICAL BIOPSY: SHX590

## 2012-02-22 ENCOUNTER — Telehealth: Payer: Self-pay

## 2012-02-22 NOTE — Telephone Encounter (Signed)
This is correct;  Pt had been on 200 and 175 alternating, but TSH was mildly elevated in April 2013 with good pt compliance, so dose had to be increased to 200 x 2 pills

## 2012-02-22 NOTE — Telephone Encounter (Signed)
[facility] requesting clarification on recent synthroid prescription as exceeds the usual recommended dose, please advise

## 2012-02-23 NOTE — Telephone Encounter (Signed)
Called [facility] at [phone] and informed of clarification information per MD.  They informed would then get prescription processed for the patient as prescribed by MD.

## 2012-03-02 DIAGNOSIS — N950 Postmenopausal bleeding: Secondary | ICD-10-CM | POA: Diagnosis not present

## 2012-03-07 DIAGNOSIS — R6889 Other general symptoms and signs: Secondary | ICD-10-CM | POA: Diagnosis not present

## 2012-03-07 DIAGNOSIS — R404 Transient alteration of awareness: Secondary | ICD-10-CM | POA: Diagnosis not present

## 2012-03-07 DIAGNOSIS — S0003XA Contusion of scalp, initial encounter: Secondary | ICD-10-CM | POA: Diagnosis not present

## 2012-03-08 ENCOUNTER — Emergency Department (HOSPITAL_COMMUNITY): Payer: Medicare Other

## 2012-03-08 ENCOUNTER — Encounter (HOSPITAL_COMMUNITY): Payer: Self-pay | Admitting: *Deleted

## 2012-03-08 ENCOUNTER — Telehealth: Payer: Self-pay | Admitting: Internal Medicine

## 2012-03-08 ENCOUNTER — Inpatient Hospital Stay (HOSPITAL_COMMUNITY)
Admission: EM | Admit: 2012-03-08 | Discharge: 2012-03-13 | DRG: 312 | Disposition: A | Discharge status: Home / Self Care | Payer: Medicare Other | Attending: Internal Medicine | Admitting: Internal Medicine

## 2012-03-08 DIAGNOSIS — N390 Urinary tract infection, site not specified: Secondary | ICD-10-CM

## 2012-03-08 DIAGNOSIS — N949 Unspecified condition associated with female genital organs and menstrual cycle: Secondary | ICD-10-CM | POA: Diagnosis present

## 2012-03-08 DIAGNOSIS — S1093XA Contusion of unspecified part of neck, initial encounter: Secondary | ICD-10-CM | POA: Diagnosis not present

## 2012-03-08 DIAGNOSIS — J449 Chronic obstructive pulmonary disease, unspecified: Secondary | ICD-10-CM | POA: Diagnosis present

## 2012-03-08 DIAGNOSIS — G4730 Sleep apnea, unspecified: Secondary | ICD-10-CM

## 2012-03-08 DIAGNOSIS — N938 Other specified abnormal uterine and vaginal bleeding: Secondary | ICD-10-CM | POA: Diagnosis present

## 2012-03-08 DIAGNOSIS — F329 Major depressive disorder, single episode, unspecified: Secondary | ICD-10-CM

## 2012-03-08 DIAGNOSIS — R55 Syncope and collapse: Secondary | ICD-10-CM | POA: Diagnosis not present

## 2012-03-08 DIAGNOSIS — F3289 Other specified depressive episodes: Secondary | ICD-10-CM | POA: Diagnosis present

## 2012-03-08 DIAGNOSIS — M109 Gout, unspecified: Secondary | ICD-10-CM | POA: Diagnosis present

## 2012-03-08 DIAGNOSIS — J9610 Chronic respiratory failure, unspecified whether with hypoxia or hypercapnia: Secondary | ICD-10-CM | POA: Diagnosis present

## 2012-03-08 DIAGNOSIS — N925 Other specified irregular menstruation: Secondary | ICD-10-CM | POA: Diagnosis present

## 2012-03-08 DIAGNOSIS — G4733 Obstructive sleep apnea (adult) (pediatric): Secondary | ICD-10-CM | POA: Diagnosis present

## 2012-03-08 DIAGNOSIS — G4710 Hypersomnia, unspecified: Secondary | ICD-10-CM | POA: Diagnosis not present

## 2012-03-08 DIAGNOSIS — Z9981 Dependence on supplemental oxygen: Secondary | ICD-10-CM | POA: Diagnosis not present

## 2012-03-08 DIAGNOSIS — T384X5A Adverse effect of oral contraceptives, initial encounter: Secondary | ICD-10-CM | POA: Diagnosis present

## 2012-03-08 DIAGNOSIS — Z853 Personal history of malignant neoplasm of breast: Secondary | ICD-10-CM

## 2012-03-08 DIAGNOSIS — R404 Transient alteration of awareness: Secondary | ICD-10-CM | POA: Diagnosis not present

## 2012-03-08 DIAGNOSIS — T448X5A Adverse effect of centrally-acting and adrenergic-neuron-blocking agents, initial encounter: Secondary | ICD-10-CM | POA: Diagnosis present

## 2012-03-08 DIAGNOSIS — Z6841 Body Mass Index (BMI) 40.0 and over, adult: Secondary | ICD-10-CM | POA: Diagnosis not present

## 2012-03-08 DIAGNOSIS — I495 Sick sinus syndrome: Secondary | ICD-10-CM

## 2012-03-08 DIAGNOSIS — E785 Hyperlipidemia, unspecified: Secondary | ICD-10-CM

## 2012-03-08 DIAGNOSIS — R221 Localized swelling, mass and lump, neck: Secondary | ICD-10-CM | POA: Diagnosis not present

## 2012-03-08 DIAGNOSIS — Y92009 Unspecified place in unspecified non-institutional (private) residence as the place of occurrence of the external cause: Secondary | ICD-10-CM

## 2012-03-08 DIAGNOSIS — J4489 Other specified chronic obstructive pulmonary disease: Secondary | ICD-10-CM

## 2012-03-08 DIAGNOSIS — I2789 Other specified pulmonary heart diseases: Secondary | ICD-10-CM | POA: Diagnosis not present

## 2012-03-08 DIAGNOSIS — R220 Localized swelling, mass and lump, head: Secondary | ICD-10-CM | POA: Diagnosis not present

## 2012-03-08 DIAGNOSIS — F32A Depression, unspecified: Secondary | ICD-10-CM

## 2012-03-08 DIAGNOSIS — S0003XA Contusion of scalp, initial encounter: Secondary | ICD-10-CM | POA: Diagnosis not present

## 2012-03-08 DIAGNOSIS — S0083XA Contusion of other part of head, initial encounter: Secondary | ICD-10-CM | POA: Diagnosis not present

## 2012-03-08 DIAGNOSIS — I455 Other specified heart block: Secondary | ICD-10-CM | POA: Diagnosis present

## 2012-03-08 DIAGNOSIS — E662 Morbid (severe) obesity with alveolar hypoventilation: Secondary | ICD-10-CM

## 2012-03-08 DIAGNOSIS — E032 Hypothyroidism due to medicaments and other exogenous substances: Secondary | ICD-10-CM | POA: Diagnosis not present

## 2012-03-08 DIAGNOSIS — J45909 Unspecified asthma, uncomplicated: Secondary | ICD-10-CM | POA: Diagnosis not present

## 2012-03-08 DIAGNOSIS — E039 Hypothyroidism, unspecified: Secondary | ICD-10-CM | POA: Diagnosis not present

## 2012-03-08 DIAGNOSIS — I10 Essential (primary) hypertension: Secondary | ICD-10-CM

## 2012-03-08 HISTORY — DX: Cellulitis, unspecified: L03.90

## 2012-03-08 HISTORY — DX: Chronic obstructive pulmonary disease, unspecified: J44.9

## 2012-03-08 LAB — BASIC METABOLIC PANEL
BUN: 12 mg/dL (ref 6–23)
CO2: 35 mEq/L — ABNORMAL HIGH (ref 19–32)
Chloride: 97 mEq/L (ref 96–112)
Creatinine, Ser: 0.81 mg/dL (ref 0.50–1.10)

## 2012-03-08 LAB — CARDIAC PANEL(CRET KIN+CKTOT+MB+TROPI)
CK, MB: 3.1 ng/mL (ref 0.3–4.0)
Relative Index: 1 (ref 0.0–2.5)
Total CK: 311 U/L — ABNORMAL HIGH (ref 7–177)
Troponin I: <0.3 ng/mL (ref <0.30)

## 2012-03-08 LAB — DIFFERENTIAL
Basophils Absolute: 0 10*3/uL (ref 0.0–0.1)
Lymphocytes Relative: 29 % (ref 12–46)
Lymphs Abs: 2.1 10*3/uL (ref 0.7–4.0)
Monocytes Absolute: 0.6 10*3/uL (ref 0.1–1.0)
Monocytes Relative: 9 % (ref 3–12)
Neutro Abs: 4.2 10*3/uL (ref 1.7–7.7)

## 2012-03-08 LAB — URINALYSIS, ROUTINE W REFLEX MICROSCOPIC
Glucose, UA: NEGATIVE mg/dL
Ketones, ur: NEGATIVE mg/dL
pH: 6.5 (ref 5.0–8.0)

## 2012-03-08 LAB — CBC
HCT: 34.7 % — ABNORMAL LOW (ref 36.0–46.0)
Hemoglobin: 10.7 g/dL — ABNORMAL LOW (ref 12.0–15.0)
RBC: 3.68 MIL/uL — ABNORMAL LOW (ref 3.87–5.11)
WBC: 7.2 10*3/uL (ref 4.0–10.5)

## 2012-03-08 LAB — URINE MICROSCOPIC-ADD ON

## 2012-03-08 LAB — TROPONIN I: Troponin I: <0.3 ng/mL (ref <0.30)

## 2012-03-08 MED ORDER — DEXTROSE 5 % IV SOLN
1.0000 g | Freq: Once | INTRAVENOUS | Status: AC
  Administered 2012-03-08: 1 g via INTRAVENOUS
  Filled 2012-03-08: qty 10

## 2012-03-08 MED ORDER — ASPIRIN EC 81 MG PO TBEC
81.0000 mg | DELAYED_RELEASE_TABLET | Freq: Every day | ORAL | Status: DC
  Administered 2012-03-09 – 2012-03-12 (×4): 81 mg via ORAL
  Filled 2012-03-08 (×5): qty 1

## 2012-03-08 MED ORDER — ASPIRIN 81 MG PO TABS: 81.0000 mg | ORAL_TABLET | Freq: Every day | ORAL | Status: DC

## 2012-03-08 MED ORDER — ALLOPURINOL 300 MG PO TABS
300.0000 mg | ORAL_TABLET | Freq: Every day | ORAL | Status: DC
  Administered 2012-03-09 – 2012-03-12 (×4): 300 mg via ORAL
  Filled 2012-03-08 (×5): qty 1

## 2012-03-08 MED ORDER — OMEGA-3-ACID ETHYL ESTERS 1 G PO CAPS
2.0000 g | ORAL_CAPSULE | Freq: Every day | ORAL | Status: DC
  Administered 2012-03-09 – 2012-03-12 (×4): 2 g via ORAL
  Filled 2012-03-08 (×5): qty 2

## 2012-03-08 MED ORDER — SODIUM CHLORIDE 0.9 % IV SOLN
INTRAVENOUS | Status: DC
  Administered 2012-03-08: 23:00:00 via INTRAVENOUS

## 2012-03-08 MED ORDER — METOPROLOL TARTRATE 50 MG PO TABS
150.0000 mg | ORAL_TABLET | Freq: Every day | ORAL | Status: DC
  Administered 2012-03-09 – 2012-03-10 (×2): 150 mg via ORAL
  Filled 2012-03-08 (×2): qty 3

## 2012-03-08 MED ORDER — ACETAMINOPHEN 325 MG PO TABS: 650.0000 mg | ORAL_TABLET | Freq: Four times a day (QID) | ORAL | Status: DC | PRN

## 2012-03-08 MED ORDER — ACETAMINOPHEN 650 MG RE SUPP: 650.0000 mg | Freq: Four times a day (QID) | RECTAL | Status: DC | PRN

## 2012-03-08 MED ORDER — ENOXAPARIN SODIUM 80 MG/0.8ML ~~LOC~~ SOLN
80.0000 mg | SUBCUTANEOUS | Status: DC
  Administered 2012-03-08: 80 mg via SUBCUTANEOUS
  Filled 2012-03-08 (×2): qty 0.8

## 2012-03-08 MED ORDER — SODIUM CHLORIDE 0.9 % IV SOLN: INTRAVENOUS | Status: AC

## 2012-03-08 MED ORDER — DULOXETINE HCL 30 MG PO CPEP
30.0000 mg | ORAL_CAPSULE | Freq: Once | ORAL | Status: AC
  Administered 2012-03-08: 30 mg via ORAL
  Filled 2012-03-08: qty 1

## 2012-03-08 MED ORDER — SODIUM CHLORIDE 0.9 % IV SOLN
INTRAVENOUS | Status: DC
  Administered 2012-03-08: 17:00:00 via INTRAVENOUS

## 2012-03-08 MED ORDER — SODIUM CHLORIDE 0.9 % IV BOLUS (SEPSIS)
500.0000 mL | Freq: Once | INTRAVENOUS | Status: AC
  Administered 2012-03-08: 500 mL via INTRAVENOUS

## 2012-03-08 MED ORDER — VITAMIN C 500 MG PO TABS
500.0000 mg | ORAL_TABLET | Freq: Every day | ORAL | Status: DC
  Administered 2012-03-09 – 2012-03-12 (×4): 500 mg via ORAL
  Filled 2012-03-08 (×5): qty 1

## 2012-03-08 MED ORDER — OMEGA-3 FATTY ACIDS 1000 MG PO CAPS: 2.0000 g | ORAL_CAPSULE | Freq: Every day | ORAL | Status: DC

## 2012-03-08 MED ORDER — SIMVASTATIN 5 MG PO TABS
5.0000 mg | ORAL_TABLET | Freq: Every day | ORAL | Status: DC
  Administered 2012-03-09 – 2012-03-12 (×4): 5 mg via ORAL
  Filled 2012-03-08 (×5): qty 1

## 2012-03-08 MED ORDER — METOPROLOL TARTRATE 50 MG PO TABS
50.0000 mg | ORAL_TABLET | Freq: Every day | ORAL | Status: DC
  Administered 2012-03-08 – 2012-03-09 (×2): 50 mg via ORAL
  Filled 2012-03-08 (×3): qty 1

## 2012-03-08 MED ORDER — AMITRIPTYLINE HCL 100 MG PO TABS
100.0000 mg | ORAL_TABLET | Freq: Every day | ORAL | Status: DC
  Administered 2012-03-08 – 2012-03-12 (×5): 100 mg via ORAL
  Filled 2012-03-08 (×6): qty 1

## 2012-03-08 MED ORDER — ONDANSETRON HCL 4 MG PO TABS: 4.0000 mg | ORAL_TABLET | Freq: Four times a day (QID) | ORAL | Status: DC | PRN

## 2012-03-08 MED ORDER — DULOXETINE HCL 30 MG PO CPEP
30.0000 mg | ORAL_CAPSULE | Freq: Every day | ORAL | Status: DC
  Administered 2012-03-09 – 2012-03-12 (×4): 30 mg via ORAL
  Filled 2012-03-08 (×6): qty 1

## 2012-03-08 MED ORDER — VITAMIN D3 25 MCG (1000 UNIT) PO TABS
1000.0000 [IU] | ORAL_TABLET | Freq: Every day | ORAL | Status: DC
  Administered 2012-03-09 – 2012-03-12 (×4): 1000 [IU] via ORAL
  Filled 2012-03-08 (×5): qty 1

## 2012-03-08 MED ORDER — ONDANSETRON HCL 4 MG/2ML IJ SOLN: 4.0000 mg | Freq: Four times a day (QID) | INTRAMUSCULAR | Status: DC | PRN

## 2012-03-08 MED ORDER — VITAMIN D3 50 MCG (2000 UT) PO TABS: 1.0000 | ORAL_TABLET | Freq: Every day | ORAL | Status: DC

## 2012-03-08 MED ORDER — DEXTROSE 5 % IV SOLN
1.0000 g | INTRAVENOUS | Status: DC
  Administered 2012-03-09 – 2012-03-11 (×3): 1 g via INTRAVENOUS
  Filled 2012-03-08 (×3): qty 10

## 2012-03-08 MED ORDER — FUROSEMIDE 40 MG PO TABS
40.0000 mg | ORAL_TABLET | Freq: Every day | ORAL | Status: DC
  Administered 2012-03-09 – 2012-03-12 (×4): 40 mg via ORAL
  Filled 2012-03-08 (×5): qty 1

## 2012-03-08 MED ORDER — ASPIRIN 81 MG PO CHEW
324.0000 mg | CHEWABLE_TABLET | Freq: Once | ORAL | Status: AC
  Administered 2012-03-08: 243 mg via ORAL
  Filled 2012-03-08: qty 4

## 2012-03-08 MED ORDER — LEVOTHYROXINE SODIUM 200 MCG PO TABS
200.0000 ug | ORAL_TABLET | Freq: Two times a day (BID) | ORAL | Status: DC
  Administered 2012-03-08 – 2012-03-13 (×10): 200 ug via ORAL
  Filled 2012-03-08 (×13): qty 1

## 2012-03-08 NOTE — Telephone Encounter (Signed)
Informed the patient has been seen in the ER today 03/08/12.

## 2012-03-08 NOTE — H&P (Signed)
PCP:   [name], MD   Chief Complaint:  syncope  HPI: 66 y/o female with pmh as described below; came into the hospital due to syncope episode that happened 24 hours PTA. Patient has been experiencing palpitations and also some difficulty breathing as prodromic symptoms; initially she associated to panic attacks and was avoiding having herself check. After experiencing this syncope event she was advised by PCP to come to ED for further evaluation and treatment.  In ED she was found with UTI, normal CT of head, no acute ischemic changes on EKG and negative troponin; BP was soft on admission. Patient denies CP, SOB, fever, abdominal pain, cough, HA's or any other acute complaints.  Of note she has history of DUB, followed by Ob/Gyn, recently finished a 10 day course of Provera with improvement.     Allergies:   Allergies  Allergen Reactions  . Codeine     REACTION: unknown  . Lisinopril     REACTION: cough      Past Medical History  Diagnosis Date  . Other and unspecified hyperlipidemia   . Unspecified essential hypertension   . Anemia   . CAD (coronary artery disease)   . OSA on CPAP   . Hypothyroidism   . Peripheral edema 06/13/2011  . Asthma 06/13/2011  . Depression 06/13/2011  . Neuropathy 06/13/2011  . Cervical radiculopathy 06/13/2011  . Gout 06/13/2011  . Obesity hypoventilation syndrome 06/13/2011  . COPD (chronic obstructive pulmonary disease)   . Breast cancer dx'd 2005    left  . Cellulitis     hx of cellulitis in left leg    Past Surgical History  Procedure Date  . Ovarian cyst removal   . Breast lumpectomy   . Cervical disc surgery     have cadaver bones,, screws, and plates   . Cervical biopsy June 2013    at [facility] for Heavy  Bleeding    Prior to Admission medications   Medication Sig Start Date End Date Taking? Authorizing Provider  albuterol (PROAIR HFA) 108 (90 BASE) MCG/ACT inhaler Inhale 2 puffs into the lungs every 6 (six) hours as needed.      Yes Historical Provider, MD  allopurinol (ZYLOPRIM) 300 MG tablet Take 300 mg by mouth daily. 06/10/11  Yes Biagio Borg, MD  amitriptyline (ELAVIL) 100 MG tablet Take 1 tablet (100 mg total) by mouth at bedtime. 02/18/12  Yes Biagio Borg, MD  aspirin 81 MG tablet Take 81 mg by mouth daily.     Yes Historical Provider, MD  Cholecalciferol (VITAMIN D3) 2000 UNITS TABS Take 1 tablet by mouth daily.     Yes Historical Provider, MD  DULoxetine (CYMBALTA) 30 MG capsule Take 30 mg by mouth daily. Take 2 at bedtime 11/09/11  Yes Biagio Borg, MD  fish oil-omega-3 fatty acids 1000 MG capsule Take 2 g by mouth daily.     Yes Historical Provider, MD  furosemide (LASIX) 80 MG tablet Take 80 mg by mouth daily.   Yes Historical Provider, MD  levothyroxine (SYNTHROID, LEVOTHROID) 200 MCG tablet Take 200 mcg by mouth 2 (two) times daily. 02/18/12  Yes Biagio Borg, MD  metoprolol (LOPRESSOR) 100 MG tablet Take 200 mg by mouth See admin instructions. Pt takes 1 and 1/2 tablets in the morning and 1/2 tablet at night for a total of 215m daily   Yes Historical Provider, MD  potassium chloride (KLOR-CON) 10 MEQ CR tablet Take 10 mEq by mouth daily. 06/08/11  Yes Biagio Borg, MD  pravastatin (PRAVACHOL) 40 MG tablet Take 40 mg by mouth daily.   Yes Historical Provider, MD  vitamin C (ASCORBIC ACID) 500 MG tablet Take 500 mg by mouth daily.     Yes Historical Provider, MD    Social History:  reports that she quit smoking about 13 years ago. Her smoking use included Cigarettes. She has a 20 pack-year smoking history. She has never used smokeless tobacco. She reports that she drinks alcohol. She reports that she does not use illicit drugs.  Family History  Problem Relation Age of Onset  . Breast cancer    . Uterine cancer Mother     Review of Systems:  Negative except as mentioned on HPI.   Physical Exam: Blood pressure 130/73, pulse 85, temperature 98 F (36.7 C), temperature source Oral, resp. rate 18, height  5' 5"  (1.651 m), weight 162.6 kg (358 lb 7.5 oz), SpO2 95.00%. Constitutional: She is oriented to person, place, and time. She appears well-developed and well-nourished.  HENT:  Head: Normocephalic. Forehead and upper lip abrasions  Eyes: EOM are normal. Pupils are equal, round, and reactive to light.  Neck: Normal range of motion. Neck supple.  Cardiovascular: Normal rate, normal heart sounds and intact distal pulses.  Pulmonary/Chest: Effort normal and breath sounds normal.  Abdominal: Bowel sounds are normal. She exhibits no distension. There is no tenderness.  Musculoskeletal: Normal range of motion. She exhibits 1-2 ++ edema bilaterally, which is unchanged from baseline. She exhibits no tenderness.  Neurological: She is alert and oriented to person, place, and time. She has normal strength. No cranial nerve deficit or sensory deficit.  Skin: Skin is warm and dry. No rash noted.  Psychiatric: She has a normal mood and affect.    Labs on Admission:  Results for orders placed during the hospital encounter of 03/08/12 (from the past 48 hour(s))  CBC     Status: Abnormal   Collection Time   03/08/12  2:50 PM      Component Value Range Comment   WBC 7.2  4.0 - 10.5 K/uL    RBC 3.68 (*) 3.87 - 5.11 MIL/uL    Hemoglobin 10.7 (*) 12.0 - 15.0 g/dL    HCT 34.7 (*) 36.0 - 46.0 %    MCV 94.3  78.0 - 100.0 fL    MCH 29.1  26.0 - 34.0 pg    MCHC 30.8  30.0 - 36.0 g/dL    RDW 15.1  11.5 - 15.5 %    Platelets 196  150 - 400 K/uL   DIFFERENTIAL     Status: Normal   Collection Time   03/08/12  2:50 PM      Component Value Range Comment   Neutrophils Relative 58  43 - 77 %    Neutro Abs 4.2  1.7 - 7.7 K/uL    Lymphocytes Relative 29  12 - 46 %    Lymphs Abs 2.1  0.7 - 4.0 K/uL    Monocytes Relative 9  3 - 12 %    Monocytes Absolute 0.6  0.1 - 1.0 K/uL    Eosinophils Relative 4  0 - 5 %    Eosinophils Absolute 0.3  0.0 - 0.7 K/uL    Basophils Relative 0  0 - 1 %    Basophils Absolute 0.0   0.0 - 0.1 K/uL   BASIC METABOLIC PANEL     Status: Abnormal   Collection Time   03/08/12  2:50 PM  Component Value Range Comment   Sodium 138  135 - 145 mEq/L    Potassium 5.0  3.5 - 5.1 mEq/L SLIGHT HEMOLYSIS   Chloride 97  96 - 112 mEq/L    CO2 35 (*) 19 - 32 mEq/L    Glucose, Bld 85  70 - 99 mg/dL    BUN 12  6 - 23 mg/dL    Creatinine, Ser 0.81  0.50 - 1.10 mg/dL    Calcium 9.2  8.4 - 10.5 mg/dL    GFR calc non Af Amer 75 (*) >90 mL/min    GFR calc Af Amer 86 (*) >90 mL/min   TROPONIN I     Status: Normal   Collection Time   03/08/12  2:50 PM      Component Value Range Comment   Troponin I <0.30  <0.30 ng/mL   URINALYSIS, ROUTINE W REFLEX MICROSCOPIC     Status: Abnormal   Collection Time   03/08/12  3:24 PM      Component Value Range Comment   Color, Urine YELLOW  YELLOW    APPearance CLEAR  CLEAR    Specific Gravity, Urine 1.018  1.005 - 1.030    pH 6.5  5.0 - 8.0    Glucose, UA NEGATIVE  NEGATIVE mg/dL    Hgb urine dipstick NEGATIVE  NEGATIVE    Bilirubin Urine NEGATIVE  NEGATIVE    Ketones, ur NEGATIVE  NEGATIVE mg/dL    Protein, ur NEGATIVE  NEGATIVE mg/dL    Urobilinogen, UA 0.2  0.0 - 1.0 mg/dL    Nitrite POSITIVE (*) NEGATIVE    Leukocytes, UA LARGE (*) NEGATIVE   URINE MICROSCOPIC-ADD ON     Status: Abnormal   Collection Time   03/08/12  3:24 PM      Component Value Range Comment   Squamous Epithelial / LPF FEW (*) RARE    WBC, UA 11-20  <3 WBC/hpf    Bacteria, UA MANY (*) RARE     Radiological Exams on Admission: No results found.  Assessment/Plan 1-Syncope: will admit to telemetry to r/o any arrhythmias; will check 2-D echo, carotid dopplers and cycle cardiac enzymes. Will check orthostatic vital signs and will decrease BP meds dose for now (especially diuretics. Hgb 10.7; so doubt dysfunctional uterine bleeding and anemia as part of problem for her syncope.   2-HYPOTHYROIDISM: continue synthroid and check free T4 and TSH.  3-HYPERLIPIDEMIA: continue  statins.  4-HYPERTENSION:BP soft; will use half dose of her medications for HTN and adjust as needed. Will check orthostatic and will give gentle hydration.  5-C O P D: no wheezing. Will continue supplemental oxygen.  6-OBSTRUCTIVE SLEEP APNEA: continue CPAP.  7-Depression: stable; continue home meds (elavil and cymbalta).  8-Obesity hypoventilation syndrome:continue supplemental oxygen.  9-UTI (lower urinary tract infection): follow cx and continue rocephin.  10-Obesity: low calorie diet and increase activity discussed with patient.  11-GOUT: continue allopurinol.  12-DVT: Lovenox   Time Spent on Admission: 60 minutes  [place] [phone]  03/08/2012, 9:29 PM

## 2012-03-08 NOTE — Telephone Encounter (Signed)
I dont see in epic where pt has presented to ER yet  OK for appt in the office tomorrow if pt does not intend to go to ER

## 2012-03-08 NOTE — ED Notes (Signed)
Pt here from home after falling yesterday and losing consciousness. EMS called yesterday, evaluated pt, did not take to hospital. States her vitals were stable and normal EKG. Reports she has hx of abnormal vaginal bleeding for past 2 years, which she has been followed by gynecologist for. Hx of panic attacks, low thyroid levels, slightly low iron levels. Hx of breast cancer, COPD, MI, and dyspnea on exertion. Pt's PCP advised ED visit for medical workup.

## 2012-03-08 NOTE — ED Notes (Signed)
Pt has medium sized knot to mid right forehead from fall at home prior to admission to the ED. Small scab OTA.

## 2012-03-08 NOTE — ED Notes (Signed)
Attempted to call report. [phone].

## 2012-03-08 NOTE — ED Notes (Signed)
Pt is from home alone and states that she fell yesterday after "passing out." She does not remember passing out but apparently did hit her head because states there was a "large goose egg" on her right to mid forehead that is "much smaller now" with small scab that is OTA. She also states that she is having some slight pain in her left knee. Pt states that she seen her PCP last week d/t excessive recurrent vaginal bleeding but that all her labs were WNL. Pt is A&Ox4, VSS, pain controlled. Will continue to monitor.

## 2012-03-08 NOTE — ED Provider Notes (Signed)
History     CSN: [id number]  Arrival date & time 03/08/12  1220   First MD Initiated Contact with Patient 03/08/12 1352      Chief Complaint  Patient presents with  . Loss of Consciousness    (Consider location/radiation/quality/duration/timing/severity/associated sxs/prior treatment) HPI Pt reports over the last several days she has had multiple episodes of dyspnea, palpitations and diaphoresis possibly worse with exertion that are improved with rest and deep breathing. She has attributed these episodes to panic attacks although she has never had this problem before. Last night she had a sudden loss of consciousness without antecedent CP, SOB, or lightheadedness. She fell, sustaining abrasions to her face but otherwise denies injury. EMS was called and came to check on her but she refused transport. She called her PCP today and was advised to come to the ED for further eval. She has history of DUB, followed by Ob/Gyn, recently finished a 10 day course of Provera with improvement.   Past Medical History  Diagnosis Date  . Other and unspecified hyperlipidemia   . Unspecified essential hypertension   . Anemia   . CAD (coronary artery disease)   . OSA on CPAP   . Hypothyroidism   . Peripheral edema 06/13/2011  . Asthma 06/13/2011  . Depression 06/13/2011  . Neuropathy 06/13/2011  . Cervical radiculopathy 06/13/2011  . Gout 06/13/2011  . Obesity hypoventilation syndrome 06/13/2011  . COPD (chronic obstructive pulmonary disease)   . Breast cancer dx'd 2005    left    Past Surgical History  Procedure Date  . Ovarian cyst removal   . Back surgery   . Breast lumpectomy     Family History  Problem Relation Age of Onset  . Breast cancer    . Uterine cancer Mother     History  Substance Use Topics  . Smoking status: Former Smoker -- 1.0 packs/day for 20 years    Types: Cigarettes    Quit date: 09/20/1998  . Smokeless tobacco: Never Used   Comment: 1ppd x 20 years  . Alcohol  Use: Yes     rare    OB History    Grav Para Term Preterm Abortions TAB SAB Ect Mult Living                  Review of Systems All other systems reviewed and are negative except as noted in HPI.   Allergies  Codeine and Lisinopril  Home Medications   Current Outpatient Rx  Name Route Sig Dispense Refill  . ALBUTEROL SULFATE HFA 108 (90 BASE) MCG/ACT IN AERS Inhalation Inhale 2 puffs into the lungs every 6 (six) hours as needed.      . ALLOPURINOL 300 MG PO TABS Oral Take 300 mg by mouth daily.    [name] AMITRIPTYLINE HCL 100 MG PO TABS Oral Take 1 tablet (100 mg total) by mouth at bedtime. 90 tablet 1  . ASPIRIN 81 MG PO TABS Oral Take 81 mg by mouth daily.      [name] VITAMIN D3 2000 UNITS PO TABS Oral Take 1 tablet by mouth daily.      . DULOXETINE HCL 30 MG PO CPEP Oral Take 30 mg by mouth daily. Take 2 at bedtime    . OMEGA-3 FATTY ACIDS 1000 MG PO CAPS Oral Take 2 g by mouth daily.      . FUROSEMIDE 80 MG PO TABS Oral Take 80 mg by mouth daily.    [name] LEVOTHYROXINE SODIUM 200 MCG PO  TABS Oral Take 200 mcg by mouth 2 (two) times daily.    [name] METOPROLOL TARTRATE 100 MG PO TABS Oral Take 200 mg by mouth See admin instructions. Pt takes 1 and 1/2 tablets in the morning and 1/2 tablet at night for a total of 229m daily    . POTASSIUM CHLORIDE 10 MEQ PO TBCR Oral Take 10 mEq by mouth daily.    .Marland KitchenPRAVASTATIN SODIUM 40 MG PO TABS Oral Take 40 mg by mouth daily.    .Marland KitchenVITAMIN C 500 MG PO TABS Oral Take 500 mg by mouth daily.        BP 118/63  Pulse 79  Temp 98.6 F (37 C) (Oral)  Resp 18  SpO2 95%  Physical Exam  Nursing note and vitals reviewed. Constitutional: She is oriented to person, place, and time. She appears well-developed and well-nourished.  HENT:  Head: Normocephalic.       Forehead and upper lip abrasions  Eyes: EOM are normal. Pupils are equal, round, and reactive to light.  Neck: Normal range of motion. Neck supple.       No midline tenderness  Cardiovascular: Normal  rate, normal heart sounds and intact distal pulses.   Pulmonary/Chest: Effort normal and breath sounds normal.  Abdominal: Bowel sounds are normal. She exhibits no distension. There is no tenderness.  Musculoskeletal: Normal range of motion. She exhibits edema (baseline 2+ pitting edema of legs, unchanged). She exhibits no tenderness.  Neurological: She is alert and oriented to person, place, and time. She has normal strength. No cranial nerve deficit or sensory deficit.  Skin: Skin is warm and dry. No rash noted.  Psychiatric: She has a normal mood and affect.    ED Course  Procedures (including critical care time)  Labs Reviewed  CBC - Abnormal; Notable for the following:    RBC 3.68 (*)     Hemoglobin 10.7 (*)     HCT 34.7 (*)     All other components within normal limits  BASIC METABOLIC PANEL - Abnormal; Notable for the following:    CO2 35 (*)     GFR calc non Af Amer 75 (*)     GFR calc Af Amer 86 (*)     All other components within normal limits  URINALYSIS, ROUTINE W REFLEX MICROSCOPIC - Abnormal; Notable for the following:    Nitrite POSITIVE (*)     Leukocytes, UA LARGE (*)     All other components within normal limits  URINE MICROSCOPIC-ADD ON - Abnormal; Notable for the following:    Squamous Epithelial / LPF FEW (*)     Bacteria, UA MANY (*)     All other components within normal limits  DIFFERENTIAL  TROPONIN I   Dg Chest 2 View  03/08/2012  *RADIOLOGY REPORT*  Clinical Data: Syncope, history hypertension, asthma, COPD, breast cancer  CHEST - 2 VIEW  Comparison: 05/11/2011  Findings: Enlargement of cardiac silhouette. Tortuous aorta. Slight pulmonary vascular congestion. No acute failure or consolidation. No pleural effusion or pneumothorax. Prior cervical spine fusion. Bilateral glenohumeral degenerative changes.  IMPRESSION: Enlargement of cardiac silhouette with pulmonary vascular congestion. No acute abnormalities.  Original Report Authenticated By: MBurnetta Sabin M.D.   Ct Head Wo Contrast  03/08/2012  *RADIOLOGY REPORT*  Clinical Data: Fall yesterday with hematoma and abrasion to the right frontal scalp.  Weakness.  Loss of consciousness.  CT HEAD WITHOUT CONTRAST  Technique:  Contiguous axial images were obtained from the base of the  skull through the vertex without contrast.  Comparison: CT head without contrast 02/16/2010.  Findings: No acute cortical infarct, hemorrhage, or mass lesion is present.  The ventricles are normal size.  No significant extra- axial fluid collection is present.  Minimal soft tissue swelling is present in the right paramidline frontal scalp.  There is no underlying fracture.  No additional soft tissue injury is present.  The paranasal sinuses and mastoid air cells are clear.  IMPRESSION:  1.  Negative CT appearance of the brain. 2.  No acute fracture. 3.  Minimal soft tissue swelling in the right paramidline scalp without underlying fracture.  Original Report Authenticated By: Resa Miner. MATTERN, M.D.     No diagnosis found.    MDM   Date: 03/08/2012  Rate: 87  Rhythm: normal sinus rhythm  QRS Axis: normal  Intervals: normal  ST/T Wave abnormalities: normal  Conduction Disutrbances:none  Narrative Interpretation:   Old EKG Reviewed: unchanged   5:27 PM Labs as above, mild anemia and UTI. Also concerned for cardiac source of her dyspnea episodes and syncope. Rocephin ordered. Will admit for further eval.        Orpheus Hayhurst B. Karle Starch, MD 03/08/12 1727

## 2012-03-08 NOTE — Telephone Encounter (Signed)
Called the patient left message to call back

## 2012-03-08 NOTE — Telephone Encounter (Signed)
Caller: Dior/Patient; PCP: [name]; CB#: [phone];  Call regarding > Panic Attacks, and Passed Out Yesterday.;   Reports panic attacks occurred over past 2 wks with shaking, diaphoresis, accelerated breathing, racing heart.  Normally manages anxiety with slow focused breathing.  On 03/07/12, had loss of consciousness;  woke up face down on carpet 03/07/12 at 1430 with no recollection of falling.  Medics were called; EKG, BP and O2 Sats were WNL. Declined to be taken to ED since VS were WNL.  Abrasions on face, L knee is painful. Suspects struck chair when fell.  GYN treating for heavy postmenopausal bleeding. Mild anemia present.  Denies any residual symptoms 03/08/12.  No appts remain in Epic;  Advised to go to ED now per nursing judgement for fainting episode 03/07/12 without any warning symptoms per Fainting Guideline.

## 2012-03-09 DIAGNOSIS — G4730 Sleep apnea, unspecified: Secondary | ICD-10-CM

## 2012-03-09 DIAGNOSIS — I10 Essential (primary) hypertension: Secondary | ICD-10-CM

## 2012-03-09 DIAGNOSIS — E032 Hypothyroidism due to medicaments and other exogenous substances: Secondary | ICD-10-CM | POA: Diagnosis not present

## 2012-03-09 DIAGNOSIS — R55 Syncope and collapse: Secondary | ICD-10-CM | POA: Diagnosis not present

## 2012-03-09 DIAGNOSIS — G4710 Hypersomnia, unspecified: Secondary | ICD-10-CM

## 2012-03-09 LAB — CBC
HCT: 31.5 % — ABNORMAL LOW (ref 36.0–46.0)
Hemoglobin: 9.7 g/dL — ABNORMAL LOW (ref 12.0–15.0)
MCH: 29.2 pg (ref 26.0–34.0)
MCHC: 30.8 g/dL (ref 30.0–36.0)
MCV: 94.9 fL (ref 78.0–100.0)

## 2012-03-09 LAB — BASIC METABOLIC PANEL
BUN: 13 mg/dL (ref 6–23)
CO2: 32 mEq/L (ref 19–32)
Chloride: 101 mEq/L (ref 96–112)
GFR calc Af Amer: 70 mL/min — ABNORMAL LOW (ref >90)
Glucose, Bld: 99 mg/dL (ref 70–99)
Potassium: 3.6 mEq/L (ref 3.5–5.1)

## 2012-03-09 LAB — TSH: TSH: 0.836 u[IU]/mL (ref 0.350–4.500)

## 2012-03-09 LAB — CARDIAC PANEL(CRET KIN+CKTOT+MB+TROPI): Total CK: 256 U/L — ABNORMAL HIGH (ref 7–177)

## 2012-03-09 MED ORDER — ALBUTEROL SULFATE HFA 108 (90 BASE) MCG/ACT IN AERS
2.0000 | INHALATION_SPRAY | Freq: Four times a day (QID) | RESPIRATORY_TRACT | Status: DC | PRN
  Administered 2012-03-11: 2 via RESPIRATORY_TRACT
  Filled 2012-03-09: qty 6.7

## 2012-03-09 MED ORDER — CHLORHEXIDINE GLUCONATE 0.12 % MT SOLN
15.0000 mL | Freq: Two times a day (BID) | OROMUCOSAL | Status: DC
  Administered 2012-03-09 – 2012-03-13 (×9): 15 mL via OROMUCOSAL
  Filled 2012-03-09 (×13): qty 15

## 2012-03-09 MED ORDER — SODIUM CHLORIDE 0.9 % IV SOLN: INTRAVENOUS | Status: AC

## 2012-03-09 MED ORDER — BIOTENE DRY MOUTH MT LIQD
15.0000 mL | Freq: Two times a day (BID) | OROMUCOSAL | Status: DC
  Administered 2012-03-09 – 2012-03-12 (×7): 15 mL via OROMUCOSAL

## 2012-03-09 MED ORDER — BIOTENE DRY MOUTH MT LIQD
15.0000 mL | Freq: Two times a day (BID) | OROMUCOSAL | Status: DC
  Administered 2012-03-09 – 2012-03-13 (×9): 15 mL via OROMUCOSAL

## 2012-03-09 NOTE — Progress Notes (Signed)
Subjective: No acute complaints; reports no active bleeding and denies CP, SOB, dizziness and palpitations. Hgb this morning down to 9.7; discussed with am nurse findings (most likely hemodilution).   Objective: Vital signs in last 24 hours: Temp:  [98 F (36.7 C)-98.6 F (37 C)] 98.1 F (36.7 C) (06/20 0536) Pulse Rate:  [79-99] 79  (06/20 0536) Resp:  [16-18] 16  (06/19 2351) BP: (98-170)/(47-106) 116/74 mmHg (06/20 0536) SpO2:  [94 %-100 %] 95 % (06/20 0536) Weight:  [162.6 kg (358 lb 7.5 oz)] 162.6 kg (358 lb 7.5 oz) (06/19 2054) Weight change:  Last BM Date: 03/08/12  Intake/Output from previous day: 06/19 0701 - 06/20 0700 In: 681.7 [I.V.:681.7] Out: -      Physical Exam: General: Alert, awake, oriented x3, in no acute distress. HEENT: No bruits, no goiter. Heart: Regular rate and rhythm, without murmurs, rubs, gallops. Lungs: Clear to auscultation bilaterally. Abdomen: Soft, nontender, nondistended, positive bowel sounds. Extremities: 2++ edema bilaterally unchanged. Neuro: Grossly intact, nonfocal.  Lab Results: Basic Metabolic Panel:  Basename 03/09/12 0510 03/08/12 1450  NA 139 138  K 3.6 5.0  CL 101 97  CO2 32 35*  GLUCOSE 99 85  BUN 13 12  CREATININE 0.97 0.81  CALCIUM 9.1 9.2  MG -- --  PHOS -- --   CBC:  Basename 03/09/12 0510 03/08/12 1450  WBC 6.4 7.2  NEUTROABS -- 4.2  HGB 9.7* 10.7*  HCT 31.5* 34.7*  MCV 94.9 94.3  PLT 194 196   Cardiac Enzymes:  Basename 03/09/12 0510 03/08/12 2140 03/08/12 1450  CKTOTAL 254* 311* --  CKMB 3.1 3.1 --  CKMBINDEX -- -- --  TROPONINI <0.30 <0.30 <0.30   Thyroid Function Tests:  Basename 03/08/12 2148 03/08/12 2140  TSH 0.836 --  T4TOTAL -- --  FREET4 -- 1.36  T3FREE -- --  THYROIDAB -- --   Urinalysis:  Basename 03/08/12 1524  COLORURINE YELLOW  LABSPEC 1.018  PHURINE 6.5  GLUCOSEU NEGATIVE  HGBUR NEGATIVE  BILIRUBINUR NEGATIVE  KETONESUR NEGATIVE  PROTEINUR NEGATIVE  UROBILINOGEN  0.2  NITRITE POSITIVE*  LEUKOCYTESUR LARGE*    Studies/Results: Dg Chest 2 View  03/08/2012  *RADIOLOGY REPORT*  Clinical Data: Syncope, history hypertension, asthma, COPD, breast cancer  CHEST - 2 VIEW  Comparison: 05/11/2011  Findings: Enlargement of cardiac silhouette. Tortuous aorta. Slight pulmonary vascular congestion. No acute failure or consolidation. No pleural effusion or pneumothorax. Prior cervical spine fusion. Bilateral glenohumeral degenerative changes.  IMPRESSION: Enlargement of cardiac silhouette with pulmonary vascular congestion. No acute abnormalities.  Original Report Authenticated By: Burnetta Sabin, M.D.   Ct Head Wo Contrast  03/08/2012  *RADIOLOGY REPORT*  Clinical Data: Fall yesterday with hematoma and abrasion to the right frontal scalp.  Weakness.  Loss of consciousness.  CT HEAD WITHOUT CONTRAST  Technique:  Contiguous axial images were obtained from the base of the skull through the vertex without contrast.  Comparison: CT head without contrast 02/16/2010.  Findings: No acute cortical infarct, hemorrhage, or mass lesion is present.  The ventricles are normal size.  No significant extra- axial fluid collection is present.  Minimal soft tissue swelling is present in the right paramidline frontal scalp.  There is no underlying fracture.  No additional soft tissue injury is present.  The paranasal sinuses and mastoid air cells are clear.  IMPRESSION:  1.  Negative CT appearance of the brain. 2.  No acute fracture. 3.  Minimal soft tissue swelling in the right paramidline scalp without underlying fracture.  Original Report Authenticated By: Resa Miner. MATTERN, M.D.    Medications: Scheduled Meds:   . sodium chloride   Intravenous STAT  . allopurinol  300 mg Oral Daily  . amitriptyline  100 mg Oral QHS  . antiseptic oral rinse  15 mL Mouth Rinse q12n4p  . antiseptic oral rinse  15 mL Mouth Rinse BID  . aspirin  324 mg Oral Once  . aspirin EC  81 mg Oral Daily  .  cefTRIAXone (ROCEPHIN)  IV  1 g Intravenous Once  . cefTRIAXone (ROCEPHIN)  IV  1 g Intravenous Q24H  . chlorhexidine  15 mL Mouth Rinse BID  . cholecalciferol  1,000 Units Oral Daily  . DULoxetine  30 mg Oral Daily  . DULoxetine  30 mg Oral Once  . furosemide  40 mg Oral Daily  . levothyroxine  200 mcg Oral BID  . metoprolol  150 mg Oral Daily  . metoprolol tartrate  50 mg Oral QHS  . omega-3 acid ethyl esters  2 g Oral Daily  . simvastatin  5 mg Oral q1800  . sodium chloride  500 mL Intravenous Once  . vitamin C  500 mg Oral Daily  . DISCONTD: aspirin  81 mg Oral Daily  . DISCONTD: enoxaparin  80 mg Subcutaneous Q24H  . DISCONTD: fish oil-omega-3 fatty acids  2 g Oral Daily  . DISCONTD: Vitamin D3  1 tablet Oral Daily   Continuous Infusions:   . sodium chloride    . DISCONTD: sodium chloride 125 mL/hr at 03/08/12 1639  . DISCONTD: sodium chloride 100 mL/hr at 03/08/12 2311   PRN Meds:.acetaminophen, acetaminophen, ondansetron (ZOFRAN) IV, ondansetron  Assessment/Plan: 1-Syncope: No abnormalities on telemetry; will follow  2-D echo and carotid dopplers. CE'z negative. No orthostatic changes. Hgb 9.7 (most likely dilutional after IVF's); no active vaginal bleeding at this point; doubt that dysfunctional uterine bleeding and this degree of anemia has anything to do with her syncope.   2-HYPOTHYROIDISM: continue synthroid; TSH and free T4 WNL.   3-HYPERLIPIDEMIA: continue statins.   4-HYPERTENSION: BP now more stable; will continue resuming antihypertensive regimen slowly and transition IVF to [facility]. Negative orthostatic VS  5-C O P D: no wheezing. Will continue supplemental oxygen and PRN inhaler; currently not using maintenance medications.  6-OBSTRUCTIVE SLEEP APNEA: continue CPAP at bedtime.   7-Depression: stable; continue home meds (elavil and cymbalta).   8-Obesity hypoventilation syndrome:continue supplemental oxygen.   9-UTI (lower urinary tract infection): follow cx  and continue rocephin.   10-Obesity: Patient encourage to follow a low calorie diet and increase activity.   11-GOUT: continue allopurinol.   12-DVT: due to drop in Hgb and hx of recent vaginal bleeding; will use SCD's and early ambulation for DVT instead of Lovenox.    LOS: 1 day   Cola Highfill [facility] [phone]  03/09/2012, 8:02 AM

## 2012-03-09 NOTE — Progress Notes (Signed)
  Echocardiogram 2D Echocardiogram has been performed.  [name] [name] 03/09/2012, 4:40 PM

## 2012-03-09 NOTE — Progress Notes (Signed)
   CARE MANAGEMENT NOTE 03/09/2012  Patient:  [name] [name],[name] [name]   Account Number:  [id number]  Date Initiated:  03/09/2012  Documentation initiated by:  Olga Coaster  Subjective/Objective Assessment:   ADMITTED WITH SYNCOPAL EPISODE     Action/Plan:   PCP IS DR [place]eves [place]; LIVES ALONE   Anticipated DC Date:  03/16/2012   Anticipated DC Plan:  [place]  CM consult            Status of service:  In process, will continue to follow Medicare Important Message given?  NA - LOS <3 / Initial given by admissions (If response is "NO", the following Medicare IM given date fields will be blank)  Per UR Regulation:  Reviewed for med. necessity/level of care/duration of stay  Comments:  03/09/2012- B Gwendy Boeder RN, BSN, MHA

## 2012-03-09 NOTE — Progress Notes (Addendum)
Pt assisted by nurse tech to bathroom to use toilet and wash up.  Sat in bathroom for several minutes on BSC while washing at the sink.  Pt wearing her home oxygen during this time, which is delivered "on demand".  Upon return to bed, after sitting for a moment, pt began to hyperventilate and complain of difficulty breathing.  Pt was able to reposition to a supine seated position with legs back in the bed and was reclining against the back of the bed, which was about 75 degrees.  Nurse tech at bedside.  Pt called out for the tech to "hold my hand", and her eyes rolled back and she lost consciousness for a period of about 5 seconds.  RN called to room - upon entry pt was already awake again.  Pt diaphoretic, still tachypnic.  Alert and oriented x4, states she "fainted out" and that this is exactly the type of episode she has been dealing with at home which has been causing her to fall.  Vitals rechecked: BP 134/101, HR 85, RR 20, O2 sat 98% on 2L (continuous hospital oxygen).  Pt states she now feels fine, but that she could "feel it coming on."  Will recheck vital signs in 15 min.  Pt denies further needs or concerns at this time.  Coolidge Breeze, RN 03/09/2012  1415 - Vital signs rechecked:  BP 124/68, HR 72, RR 18.  O2 sats 98% on 2L.  Pt denies further concerns or needs.  Coolidge Breeze, RN 03/09/2012

## 2012-03-09 NOTE — Progress Notes (Signed)
Pt's Hgb is 9.7 this am, it was 10.7 yesterday, Dr. Dyann Kief was paged through [email or web address], still awaiting a call back. Will pass it on to the am on-coming rn to follow up.---[name] [name], [name] Rahimi, rn

## 2012-03-10 ENCOUNTER — Encounter (HOSPITAL_COMMUNITY): Payer: Self-pay | Admitting: Physician Assistant

## 2012-03-10 DIAGNOSIS — R55 Syncope and collapse: Secondary | ICD-10-CM

## 2012-03-10 DIAGNOSIS — I10 Essential (primary) hypertension: Secondary | ICD-10-CM

## 2012-03-10 DIAGNOSIS — E032 Hypothyroidism due to medicaments and other exogenous substances: Secondary | ICD-10-CM

## 2012-03-10 LAB — BASIC METABOLIC PANEL
BUN: 11 mg/dL (ref 6–23)
Chloride: 99 mEq/L (ref 96–112)
Creatinine, Ser: 0.86 mg/dL (ref 0.50–1.10)
GFR calc Af Amer: 80 mL/min — ABNORMAL LOW (ref >90)
Glucose, Bld: 95 mg/dL (ref 70–99)

## 2012-03-10 LAB — CBC
HCT: 33.3 % — ABNORMAL LOW (ref 36.0–46.0)
MCV: 96.2 fL (ref 78.0–100.0)
RDW: 14.9 % (ref 11.5–15.5)
WBC: 5.6 10*3/uL (ref 4.0–10.5)

## 2012-03-10 LAB — URINE CULTURE

## 2012-03-10 MED ORDER — MEDROXYPROGESTERONE ACETATE 10 MG PO TABS
10.0000 mg | ORAL_TABLET | Freq: Two times a day (BID) | ORAL | Status: DC
  Administered 2012-03-10: 10 mg via ORAL
  Filled 2012-03-10: qty 1

## 2012-03-10 MED ORDER — POTASSIUM CHLORIDE CRYS ER 20 MEQ PO TBCR
40.0000 meq | EXTENDED_RELEASE_TABLET | ORAL | Status: AC
  Administered 2012-03-10: 40 meq via ORAL
  Filled 2012-03-10: qty 2

## 2012-03-10 MED ORDER — METOPROLOL TARTRATE 25 MG PO TABS
25.0000 mg | ORAL_TABLET | Freq: Two times a day (BID) | ORAL | Status: DC
  Administered 2012-03-10 – 2012-03-11 (×3): 25 mg via ORAL
  Filled 2012-03-10 (×5): qty 1

## 2012-03-10 NOTE — Progress Notes (Signed)
Subjective: Reports no active bleeding and denies CP. Had two episodes of LOC and fainting; last one with associated pause on telemetry of 8 seconds.  Objective: Vital signs in last 24 hours: Temp:  [98.3 F (36.8 C)-98.7 F (37.1 C)] 98.7 F (37.1 C) (06/21 0702) Pulse Rate:  [68-84] 80  (06/21 1210) Resp:  [18-22] 22  (06/21 1210) BP: (109-127)/(55-80) 125/72 mmHg (06/21 1210) SpO2:  [100 %] 100 % (06/21 1210) Weight change:  Last BM Date: 03/09/12  Intake/Output from previous day: 06/20 0701 - 06/21 0700 In: 720 [P.O.:720] Out: 1650 [Urine:1650] Total I/O In: -  Out: 400 [Urine:400]   Physical Exam: General: Alert, awake, oriented x3, in no acute distress. HEENT: No bruits, no goiter. Heart: Regular rate and rhythm, without murmurs, rubs, gallops. Lungs: Clear to auscultation bilaterally. Abdomen: Soft, nontender, nondistended, positive bowel sounds. Extremities: 2++ edema bilaterally unchanged. Neuro: Grossly intact, nonfocal.  Lab Results: Basic Metabolic Panel:  Basename 03/10/12 0427 03/09/12 0510  NA 138 139  K 3.7 3.6  CL 99 101  CO2 34* 32  GLUCOSE 95 99  BUN 11 13  CREATININE 0.86 0.97  CALCIUM 9.1 9.1  MG -- --  PHOS -- --   CBC:  Basename 03/10/12 0427 03/09/12 0510 03/08/12 1450  WBC 5.6 6.4 --  NEUTROABS -- -- 4.2  HGB 10.2* 9.7* --  HCT 33.3* 31.5* --  MCV 96.2 94.9 --  PLT 204 194 --   Cardiac Enzymes:  Basename 03/09/12 1350 03/09/12 0510 03/08/12 2140  CKTOTAL 256* 254* 311*  CKMB 3.3 3.1 3.1  CKMBINDEX -- -- --  TROPONINI <0.30 <0.30 <0.30   Thyroid Function Tests:  Basename 03/08/12 2148 03/08/12 2140  TSH 0.836 --  T4TOTAL -- --  FREET4 -- 1.36  T3FREE -- --  THYROIDAB -- --   Urinalysis:  Basename 03/08/12 1524  COLORURINE YELLOW  LABSPEC 1.018  PHURINE 6.5  GLUCOSEU NEGATIVE  HGBUR NEGATIVE  BILIRUBINUR NEGATIVE  KETONESUR NEGATIVE  PROTEINUR NEGATIVE  UROBILINOGEN 0.2  NITRITE POSITIVE*  LEUKOCYTESUR  LARGE*    Studies/Results: Dg Chest 2 View  03/08/2012  *RADIOLOGY REPORT*  Clinical Data: Syncope, history hypertension, asthma, COPD, breast cancer  CHEST - 2 VIEW  Comparison: 05/11/2011  Findings: Enlargement of cardiac silhouette. Tortuous aorta. Slight pulmonary vascular congestion. No acute failure or consolidation. No pleural effusion or pneumothorax. Prior cervical spine fusion. Bilateral glenohumeral degenerative changes.  IMPRESSION: Enlargement of cardiac silhouette with pulmonary vascular congestion. No acute abnormalities.  Original Report Authenticated By: Burnetta Sabin, M.D.   Ct Head Wo Contrast  03/08/2012  *RADIOLOGY REPORT*  Clinical Data: Fall yesterday with hematoma and abrasion to the right frontal scalp.  Weakness.  Loss of consciousness.  CT HEAD WITHOUT CONTRAST  Technique:  Contiguous axial images were obtained from the base of the skull through the vertex without contrast.  Comparison: CT head without contrast 02/16/2010.  Findings: No acute cortical infarct, hemorrhage, or mass lesion is present.  The ventricles are normal size.  No significant extra- axial fluid collection is present.  Minimal soft tissue swelling is present in the right paramidline frontal scalp.  There is no underlying fracture.  No additional soft tissue injury is present.  The paranasal sinuses and mastoid air cells are clear.  IMPRESSION:  1.  Negative CT appearance of the brain. 2.  No acute fracture. 3.  Minimal soft tissue swelling in the right paramidline scalp without underlying fracture.  Original Report Authenticated By: Resa Miner. MATTERN,  M.D.    Medications: Scheduled Meds:    . sodium chloride   Intravenous STAT  . allopurinol  300 mg Oral Daily  . amitriptyline  100 mg Oral QHS  . antiseptic oral rinse  15 mL Mouth Rinse q12n4p  . antiseptic oral rinse  15 mL Mouth Rinse BID  . aspirin EC  81 mg Oral Daily  . cefTRIAXone (ROCEPHIN)  IV  1 g Intravenous Q24H  . chlorhexidine  15  mL Mouth Rinse BID  . cholecalciferol  1,000 Units Oral Daily  . DULoxetine  30 mg Oral Daily  . furosemide  40 mg Oral Daily  . levothyroxine  200 mcg Oral BID  . metoprolol  150 mg Oral Daily  . metoprolol tartrate  50 mg Oral QHS  . omega-3 acid ethyl esters  2 g Oral Daily  . simvastatin  5 mg Oral q1800  . vitamin C  500 mg Oral Daily   Continuous Infusions:  PRN Meds:.acetaminophen, acetaminophen, albuterol, ondansetron (ZOFRAN) IV, ondansetron  Assessment/Plan: 1-Syncope: Patient 8.6 seconds pause on telemetry and associated LOC; 2-D echo results WNL,and carotid dopplers pending. CE'z negative. No orthostatic changes. Hgb 10.2; currently without active vaginal bleeding. Will consult cardiology, she might need a pacemaker.   2-HYPOTHYROIDISM: continue synthroid; TSH and free T4 WNL.   3-HYPERLIPIDEMIA: continue statins.   4-HYPERTENSION: BP now more stable; will continue resuming antihypertensive regimen slowly. IVF to NSL.  5-C O P D: no wheezing. Will continue supplemental oxygen and PRN inhaler; currently not using maintenance medications.  6-OBSTRUCTIVE SLEEP APNEA: continue CPAP at bedtime.   7-Depression: stable; continue home meds (elavil and cymbalta).   8-Obesity hypoventilation syndrome:continue supplemental oxygen.   9-UTI (lower urinary tract infection): follow cx and continue rocephin (day #2).   10-Obesity: Patient encourage to follow a low calorie diet and increase activity.   11-GOUT: continue allopurinol.   12-DVT: due to drop in Hgb and hx of recent vaginal bleeding; will use SCD's and early ambulation for DVT instead of Lovenox.    LOS: 2 days   Khylen Riolo [facility] [phone]  03/10/2012, 12:27 PM

## 2012-03-10 NOTE — Progress Notes (Signed)
Patient placed on cpap 10cmH20 with 2lpm 02 bleed in. Patient is tolerating settings well at this time. RT will continue to monitor.

## 2012-03-10 NOTE — Consult Note (Signed)
CARDIOLOGY CONSULT NOTE  Patient ID: [name] [name], MRN: [id number], DOB/AGE: November 30, 1945 66 y.o. Admit date: 03/08/2012   Date of Consult: 03/10/2012 Primary Physician: [name], MD Primary Cardiologist: Dr. Lia Foyer (last seen 04/2010)  Chief Complaint: passed out Reason for Consult: 8.1 second pause  HPI: 74 y/o F with hx of prior nonobstructive CAD, OSA/OHS, morbid obesity, HTN, breast CA presented to [facility] with complaints of syncope. She's had approximately 10 episodes total of presyncope, three of these culminating in full LOC -- although she says now looking back, several of the episodes happened while she was by herself so she's wondering if she's actually passed out during some of those times as well. She reports a sensation like a panic attack/impending doom, usually followed afterwards by profuse diaphoresis and dizziness. She passed out on Monday 6/17 and hit her head and lip, and called her ob/gyn doctor and was told to follow up with medical doctor. She didn't come to the ER until after another episode on Wednesday 6/19. She had a syncopal episode today associated with an 8.1 second pause. She had been able to ambulate herself without difficulty earlier today, however, while sitting on the commode to void started to feel poorly. The patient stated, "I'm starting to feel funny" to the nurse and then slumped forward with total LOC. The nurse helped put her back upright and the patient immediately began to regain consciousness. She reports this has been fairly typical - she comes to right away. During this episode it appears on telemetry she had progressively brady'd down to the 30s-40s then had the 8.11 second pause, with a subsequently increased HR thereafter. She had a similar episode yesterday while on the side of the bed. At other times she is in NSR 70s-80s.  She is on metoprolol 131m qam, 578mqpm for HTN per her report. She denies any CP, nausea, vomiting prior to or after these events,  nor recently at all. She is on chronic O2 but denies any SOB.  In the ER she was found to have nitrite+ UTI, normal CT of the head, somewhat soft blood pressures on admission. She was also anemic but has a hx of DUB followed by ob/gyn and recently finished a course of Provera. She reports extensive workup thus far that's been negative given hx of breast CA. CKs somewhat elevated but MB, troponin negative. EKG was without acute changes, normal intervals. TSH, free T4 are normal. She has not seen Dr. StLia Foyerince 04/2010, and last [facility] later to establish follow-up and was told she was stable and to continue med rx including ASA. [place] Past Medical History  Diagnosis Date  . Other and unspecified hyperlipidemia   . Unspecified essential hypertension   . Anemia   . CAD (coronary artery disease)     a. Nonobstructive CAD 2007 (EF 75%.  RCA  proximal 75% tubular, 25% prox LAD, 25% d1, 50% D2[facility] NSTEMI 01/2010 in setting of respiratory failure, medical approach (not a good candidate for noninvasive eval)  . OSA on CPAP   . Hypothyroidism   . Peripheral edema 06/13/2011  . Asthma 06/13/2011  . Depression 06/13/2011  . Neuropathy 06/13/2011  . Cervical radiculopathy 06/13/2011  . Gout 06/13/2011  . Obesity hypoventilation syndrome 06/13/2011  . COPD (chronic obstructive pulmonary disease)   . Breast cancer dx'd 2005    left  . Cellulitis     hx of  cellulitis in left leg      Most Recent Cardiac Studies: 2D echo 03/09/12 Study Conclusions - Left ventricle: The cavity size was normal. Systolic function was normal. The estimated ejection fraction was in the range of 55% to 60%. Wall motion was normal; there were no regional wall motion abnormalities. - Aortic valve: Mild regurgitation. - Mitral valve: Calcified annulus. Mildly thickened leaflets .- Left atrium: The atrium was moderately dilated. - Pulmonary arteries: Systolic  pressure was mildly increased.     Surgical History:  Past Surgical History  Procedure Date  . Ovarian cyst removal   . Breast lumpectomy   . Cervical disc surgery     have cadaver bones,, screws, and plates   . Cervical biopsy June 2013    at [facility]     Home Meds: Prior to Admission medications   Medication Sig Start Date End Date Taking? Authorizing Provider  albuterol (PROAIR HFA) 108 (90 BASE) MCG/ACT inhaler Inhale 2 puffs into the lungs every 6 (six) hours as needed.     Yes Historical Provider, MD  allopurinol (ZYLOPRIM) 300 MG tablet Take 300 mg by mouth daily. 06/10/11  Yes Biagio Borg, MD  amitriptyline (ELAVIL) 100 MG tablet Take 1 tablet (100 mg total) by mouth at bedtime. 02/18/12  Yes Biagio Borg, MD  aspirin 81 MG tablet Take 81 mg by mouth daily.     Yes Historical Provider, MD  Cholecalciferol (VITAMIN D3) 2000 UNITS TABS Take 1 tablet by mouth daily.     Yes Historical Provider, MD  DULoxetine (CYMBALTA) 30 MG capsule Take 30 mg by mouth daily. Take 2 at bedtime 11/09/11  Yes Biagio Borg, MD  fish oil-omega-3 fatty acids 1000 MG capsule Take 2 g by mouth daily.     Yes Historical Provider, MD  furosemide (LASIX) 80 MG tablet Take 80 mg by mouth daily.   Yes Historical Provider, MD  levothyroxine (SYNTHROID, LEVOTHROID) 200 MCG tablet Take 200 mcg by mouth 2 (two) times daily. 02/18/12  Yes Biagio Borg, MD  metoprolol (LOPRESSOR) 100 MG tablet Take 200 mg by mouth See admin instructions. Pt takes 1 and 1/2 tablets in the morning and 1/2 tablet at night for a total of 256m daily   Yes Historical Provider, MD  potassium chloride (KLOR-CON) 10 MEQ CR tablet Take 10 mEq by mouth daily. 06/08/11  Yes JBiagio Borg MD  pravastatin (PRAVACHOL) 40 MG tablet Take 40 mg by mouth daily.   Yes Historical Provider, MD  vitamin C (ASCORBIC ACID) 500 MG tablet Take 500 mg by mouth daily.     Yes Historical Provider, MD    Inpatient Medications:     . sodium  chloride   Intravenous STAT  . allopurinol  300 mg Oral Daily  . amitriptyline  100 mg Oral QHS  . antiseptic oral rinse  15 mL Mouth Rinse q12n4p  . antiseptic oral rinse  15 mL Mouth Rinse BID  . aspirin EC  81 mg Oral Daily  . cefTRIAXone (ROCEPHIN)  IV  1 g Intravenous Q24H  . chlorhexidine  15 mL Mouth Rinse BID  . cholecalciferol  1,000 Units Oral Daily  . DULoxetine  30 mg Oral Daily  . furosemide  40 mg Oral Daily  . levothyroxine  200 mcg Oral BID  . metoprolol  150 mg Oral Daily  . metoprolol tartrate  50 mg Oral QHS  . omega-3 acid ethyl esters  2 g Oral  Daily  . potassium chloride  40 mEq Oral Q4H  . simvastatin  5 mg Oral q1800  . vitamin C  500 mg Oral Daily    Allergies:  Allergies  Allergen Reactions  . Codeine     REACTION: unknown  . Lisinopril     REACTION: cough    History   Social History  . Marital Status: Divorced    Spouse Name: N/A    Number of Children: N/A  . Years of Education: N/A   Occupational History  . Not on file.   Social History Main Topics  . Smoking status: Former Smoker -- 1.0 packs/day for 20 years    Types: Cigarettes    Quit date: 09/20/1998  . Smokeless tobacco: Never Used   Comment: 1ppd x 20 years  . Alcohol Use: Yes     rare  . Drug Use: No  . Sexually Active: Not Currently    Birth Control/ Protection: Post-menopausal   Other Topics Concern  . Not on file   Social History Narrative  . No narrative on file     Family History  Problem Relation Age of Onset  . Breast cancer    . Uterine cancer Mother      Review of Systems: General: negative for chills, fever, night sweats or weight changes.  Cardiovascular: see above Dermatological: negative for rash Respiratory: negative for cough or wheezing GU: negative for hematuria but positive vaginal bleeding Abdominal: negative for nausea, vomiting, diarrhea, bright red blood per rectum, melena, or hematemesis Neurologic: see above All other systems reviewed  and are otherwise negative except as noted above.  Labs:  [facility] 03/09/12 1350 03/09/12 0510 03/08/12 2140 03/08/12 1450  CKTOTAL 256* 254* 311* --  CKMB 3.3 3.1 3.1 --  TROPONINI <0.30 <0.30 <0.30 <0.30   Lab Results  Component Value Date   WBC 5.6 03/10/2012   HGB 10.2* 03/10/2012   HCT 33.3* 03/10/2012   MCV 96.2 03/10/2012   PLT 204 03/10/2012     Lab 03/10/12 0427  NA 138  K 3.7  CL 99  CO2 34*  BUN 11  CREATININE 0.86  CALCIUM 9.1  PROT --  BILITOT --  ALKPHOS --  ALT --  AST --  GLUCOSE 95   Lab Results  Component Value Date   CHOL 174 12/28/2011   HDL 46.30 12/28/2011   LDLCALC 96 12/28/2011   TRIG 159.0* 12/28/2011   Radiology/Studies:  1. Chest 2 View 03/08/2012  *RADIOLOGY REPORT*  Clinical Data: Syncope, history hypertension, asthma, COPD, breast cancer  CHEST - 2 VIEW  Comparison: 05/11/2011  Findings: Enlargement of cardiac silhouette. Tortuous aorta. Slight pulmonary vascular congestion. No acute failure or consolidation. No pleural effusion or pneumothorax. Prior cervical spine fusion. Bilateral glenohumeral degenerative changes.  IMPRESSION: Enlargement of cardiac silhouette with pulmonary vascular congestion. No acute abnormalities.  Original Report Authenticated By: Burnetta Sabin, M.D.   2. Ct Head Wo Contrast 03/08/2012  *RADIOLOGY REPORT*  Clinical Data: Fall yesterday with hematoma and abrasion to the right frontal scalp.  Weakness.  Loss of consciousness.  CT HEAD WITHOUT CONTRAST  Technique:  Contiguous axial images were obtained from the base of the skull through the vertex without contrast.  Comparison: CT head without contrast 02/16/2010.  Findings: No acute cortical infarct, hemorrhage, or mass lesion is present.  The ventricles are normal size.  No significant extra- axial fluid collection is present.  Minimal soft tissue swelling is present in the right paramidline frontal scalp.  There is no underlying fracture.  No additional soft tissue injury is  present.  The paranasal sinuses and mastoid air cells are clear.  IMPRESSION:  1.  Negative CT appearance of the brain. 2.  No acute fracture. 3.  Minimal soft tissue swelling in the right paramidline scalp without underlying fracture.  Original Report Authenticated By: Resa Miner. MATTERN, M.D.   EKG: NSR 87bpm no acute changes. QRS 84, PR 164.  Physical Exam: Blood pressure 111/78, pulse 74, temperature 98.4 F (36.9 C), temperature source Oral, resp. rate 20, height 5' 5"  (1.651 m), weight 358 lb 7.5 oz (162.6 kg), SpO2 100.00%. General: Well developed obese AAF in no acute distress. She herself is not diaphoretic now but her back gown is soaked. Head: Normocephalic, atraumatic, sclera non-icteric, no xanthomas, nares are without discharge.  Neck: Negative for carotid bruits. JVD not elevated. Lungs: Clear bilaterally to auscultation without wheezes, rales, or rhonchi. Breathing is unlabored. Heart: RRR with S1 S2, possible S4. No murmurs or gallops appreciated. Abdomen: Soft, non-tender, non-distended with normoactive bowel sounds. No hepatomegaly. No rebound/guarding. No obvious abdominal masses. Msk:  Strength and tone appear normal for age. Extremities: No clubbing or cyanosis. Difficult to assess edema vs natural leg size but she does have some pitting socklines.  Distal pedal pulses are 2+ and equal bilaterally. Neuro: Alert and oriented X 3. Moves all extremities spontaneously. Psych:  Responds to questions appropriately with a normal affect.   Assessment and Plan:   1. Syncope with significant bradycardia with pauses 2. HTN 3. OSA/OHS with chronic respiratory failure on home O2 (on demand) 4. H/o nonobstructive CAD 2007, NSTEMI 2011 in setting of resp failure tx medically  The patient was seen and examined in conjunction with Dr. Caryl Comes.  See below for comprehensive thoughts.  Signed, Melina Copa PA-C 03/10/2012, 2:50 PM   The patient presents with the acute onset of  recurrent syncope. These episodes are associated with documented sinus slowing and sinus arrest. Concurrent with these events has been the use of medroxyprogesterone and these occur in the setting of long standing beta blocker therapy. The sinus slowing prior to sinus arrest suggests a mechanism of hyper vagotonia as opposed to a primary arrhythmic issue. The implications of this then our trying to identify a trigger of the hyper vagotonia. Sinus node dysfunction and syncope are rarely associated with medroxyprogesterone. The drug apparently has some autonomic effects although it apparently enhances heart rate variability which well and for increased vagal tone does not seem to be a likely surrogate as a trigger for hypervagotonic episode  2 episodes in the hospital have occurred within 2 minutes or so of post micturition or arising from the commode. I do not think that the dysfunctional bleeding should be serving as a trigger with relatively well preserved hemoglobin as would not be associated with viscous dilatation.  If were not able to identify a reversible trigger there are data suggesting that pacing particularly using the [employer] CLS system may help obviate recurrent symptoms. This would be later referred.  For now I would then #1 begin to down titrate her beta blocker #2 discontinue her Doxy progesterone and consider alternative therapies for her dose functional bleeding #3 observe in hospital for another 48-72 hours as these episodes are clearly ominous with prolonged pausing and she is at risk for hurting herself with falls #4 anticipate at discharge the use of an event recorder   Virl Axe, MD 03/10/2012 5:02 PM

## 2012-03-10 NOTE — Progress Notes (Signed)
RN assisted pt to the bathroom to void.  After sitting for a moment, pt stated, "Don't go anywhere, I feel funny".  5-10 seconds later the patient slumped forward with total loss of consciousness.  RN at pt's side was able to reposition her upright again at which point she immediately regained consciousness.  Pt became diaphoretic and slightly dyspnic, but stated she was already feeling better.  Monitor tech called RN to report an 8.11 second pause, charge nurse also came to room to assist.  Pt was able to ambulate back to bed without incident, vital signs stable.  Provided emotional and environmental support.  Dr. Dyann Kief notified, currently at bedside.  Will continue to monitor.  Coolidge Breeze, RN 03/10/2012

## 2012-03-10 NOTE — Progress Notes (Signed)
*  PRELIMINARY RESULTS* Vascular Ultrasound Carotid Duplex (Doppler) has been completed.   Technically difficult study. Not all areas could be clearly evaluated. There does not appear to be significant ICA stenosis bilaterally. Antegrade vertebral flow bilaterally.  Zella Ball, Garza-Salinas II 03/10/2012, 1:23 PM

## 2012-03-11 DIAGNOSIS — E032 Hypothyroidism due to medicaments and other exogenous substances: Secondary | ICD-10-CM | POA: Diagnosis not present

## 2012-03-11 DIAGNOSIS — R55 Syncope and collapse: Secondary | ICD-10-CM | POA: Diagnosis not present

## 2012-03-11 DIAGNOSIS — I10 Essential (primary) hypertension: Secondary | ICD-10-CM | POA: Diagnosis not present

## 2012-03-11 LAB — VITAMIN D 1,25 DIHYDROXY: Vitamin D3 1, 25 (OH)2: 77 pg/mL

## 2012-03-11 NOTE — Progress Notes (Signed)
@   Subjective:  Denies CP or dyspnea; no dizziness   Objective:  Filed Vitals:   03/10/12 1210 03/10/12 1359 03/10/12 2045 03/11/12 0446  BP: 125/72 111/78 112/76 111/78  Pulse: 80 74 74 76  Temp:  98.4 F (36.9 C) 98.4 F (36.9 C) 98.9 F (37.2 C)  TempSrc:  Oral Oral Oral  Resp: 22 20 20 20   Height:      Weight:      SpO2: 100% 100% 97% 98%    Intake/Output from previous day:  Intake/Output Summary (Last 24 hours) at 03/11/12 0721 Last data filed at 03/10/12 2040  Gross per 24 hour  Intake    780 ml  Output    775 ml  Net      5 ml    Physical Exam: Physical exam: Well-developed morbidly obese in no acute distress.  Skin is warm and dry.  HEENT is normal.  Neck is supple.  Chest is clear to auscultation with normal expansion.  Cardiovascular exam is regular rate and rhythm.  Abdominal exam nontender or distended. No masses palpated. Extremities show trace edema. neuro grossly intact    Lab Results: Basic Metabolic Panel:  Basename 03/10/12 0427 03/09/12 0510  NA 138 139  K 3.7 3.6  CL 99 101  CO2 34* 32  GLUCOSE 95 99  BUN 11 13  CREATININE 0.86 0.97  CALCIUM 9.1 9.1  MG 2.2 --  PHOS -- --   CBC:  Basename 03/10/12 0427 03/09/12 0510 03/08/12 1450  WBC 5.6 6.4 --  NEUTROABS -- -- 4.2  HGB 10.2* 9.7* --  HCT 33.3* 31.5* --  MCV 96.2 94.9 --  PLT 204 194 --   Cardiac Enzymes:  Basename 03/09/12 1350 03/09/12 0510 03/08/12 2140  CKTOTAL 256* 254* 311*  CKMB 3.3 3.1 3.1  CKMBINDEX -- -- --  TROPONINI <0.30 <0.30 <0.30     Assessment/Plan:  1) syncope - documented 8 sec pause; no further events since yesterday afternoon. LV function normal by echo, ECG normal, enzymes negative; beta blocker is being weaned and medroxyprogesterone has been discontinued; continue telemetry. If she has further episodes despite withdrawing above meds, she will need pacemaker. Continue zoll pacemaker at bedside. 2) Hypertension - follow BP closely with reduction  in beta blocker dose and add non  AV nodal blocking agent if needed. 3) UTI - continue antibiotics.   Kirk Ruths 03/11/2012, 7:21 AM

## 2012-03-11 NOTE — Progress Notes (Signed)
03/10/12 2220  RN assisted  patient to bedside commode located next to the bed.Patient tolerated the transfer to the [facility] very well. Patient is still wearing the Zoll pads and monitor as well as telemetry. The patient has reported no "strange feelings". Will continue to monitor patient.

## 2012-03-11 NOTE — Progress Notes (Signed)
Subjective: Reports no active bleeding and denies CP or SOB. No further LOC/syncope episodes.  Objective: Vital signs in last 24 hours: Temp:  [98.4 F (36.9 C)-98.9 F (37.2 C)] 98.9 F (37.2 C) (06/22 0446) Pulse Rate:  [74-91] 91  (06/22 0950) Resp:  [20-22] 20  (06/22 0446) BP: (111-127)/(72-79) 127/79 mmHg (06/22 0950) SpO2:  [97 %-100 %] 99 % (06/22 0912) Weight change:  Last BM Date: 03/10/12  Intake/Output from previous day: 06/21 0701 - 06/22 0700 In: 780 [P.O.:680; IV Piggyback:100] Out: 925 [Urine:925] Total I/O In: 240 [P.O.:240] Out: 450 [Urine:450]   Physical Exam: General: Alert, awake, oriented x3, in no acute distress. HEENT: No bruits, no goiter. Heart: Regular rate and rhythm, without murmurs, rubs, gallops. Lungs: Clear to auscultation bilaterally. Abdomen: Soft, nontender, nondistended, positive bowel sounds. Extremities: 1++ edema bilaterally unchanged. Neuro: Grossly intact, nonfocal.  Lab Results: Basic Metabolic Panel:  Basename 03/10/12 0427 03/09/12 0510  NA 138 139  K 3.7 3.6  CL 99 101  CO2 34* 32  GLUCOSE 95 99  BUN 11 13  CREATININE 0.86 0.97  CALCIUM 9.1 9.1  MG 2.2 --  PHOS -- --   CBC:  Basename 03/10/12 0427 03/09/12 0510 03/08/12 1450  WBC 5.6 6.4 --  NEUTROABS -- -- 4.2  HGB 10.2* 9.7* --  HCT 33.3* 31.5* --  MCV 96.2 94.9 --  PLT 204 194 --   Cardiac Enzymes:  Basename 03/09/12 1350 03/09/12 0510 03/08/12 2140  CKTOTAL 256* 254* 311*  CKMB 3.3 3.1 3.1  CKMBINDEX -- -- --  TROPONINI <0.30 <0.30 <0.30   Thyroid Function Tests:  Basename 03/08/12 2148 03/08/12 2140  TSH 0.836 --  T4TOTAL -- --  FREET4 -- 1.36  T3FREE -- --  THYROIDAB -- --   Urinalysis:  Basename 03/08/12 1524  COLORURINE YELLOW  LABSPEC 1.018  PHURINE 6.5  GLUCOSEU NEGATIVE  HGBUR NEGATIVE  BILIRUBINUR NEGATIVE  KETONESUR NEGATIVE  PROTEINUR NEGATIVE  UROBILINOGEN 0.2  NITRITE POSITIVE*  LEUKOCYTESUR LARGE*     Studies/Results: No results found.  Medications: Scheduled Meds:    . allopurinol  300 mg Oral Daily  . amitriptyline  100 mg Oral QHS  . antiseptic oral rinse  15 mL Mouth Rinse q12n4p  . antiseptic oral rinse  15 mL Mouth Rinse BID  . aspirin EC  81 mg Oral Daily  . cefTRIAXone (ROCEPHIN)  IV  1 g Intravenous Q24H  . chlorhexidine  15 mL Mouth Rinse BID  . cholecalciferol  1,000 Units Oral Daily  . DULoxetine  30 mg Oral Daily  . furosemide  40 mg Oral Daily  . levothyroxine  200 mcg Oral BID  . metoprolol tartrate  25 mg Oral BID  . omega-3 acid ethyl esters  2 g Oral Daily  . potassium chloride  40 mEq Oral Q4H  . simvastatin  5 mg Oral q1800  . vitamin C  500 mg Oral Daily  . DISCONTD: medroxyPROGESTERone  10 mg Oral BID  . DISCONTD: metoprolol  150 mg Oral Daily  . DISCONTD: metoprolol tartrate  50 mg Oral QHS   Continuous Infusions:  PRN Meds:.acetaminophen, acetaminophen, albuterol, ondansetron (ZOFRAN) IV, ondansetron  Assessment/Plan: 1-Syncope: Patient with 8.1 seconds pause on telemetry and associated LOC; 2-D echo results WNL,and carotid dopplers WNL. CE'z negative as well. No orthostatic changes. Hgb stable. Appreciate cardiology inputs; will monitor her for another 48 hours on telemetry and zoll pads; will decrease metoprolol and stop provera. If further episodes of LOC/syncope occurs  despite changes; she will required a pacemaker.  2-HYPOTHYROIDISM: continue synthroid; TSH and free T4 WNL.   3-HYPERLIPIDEMIA: continue statins.   4-HYPERTENSION: BP now more stable; will continue current regimen. IVF to NSL.  5-C O P D: no wheezing. Will continue supplemental oxygen and PRN inhaler; currently not using maintenance medications.  6-OBSTRUCTIVE SLEEP APNEA: continue CPAP at bedtime.   7-Depression: stable; continue home meds (elavil and cymbalta).   8-Obesity hypoventilation syndrome:continue supplemental oxygen.   9-UTI (lower urinary tract infection):  cx unrevealing a specific microorganism; will continue rocephin for 1 more day and stop tx (day #3); .   10-Obesity: Patient encourage to follow a low calorie diet and increase activity.   11-GOUT: continue allopurinol.   12-DVT: continue SCD's.     LOS: 3 days   [name] [name] [facility] [phone]  03/11/2012, 11:42 AM

## 2012-03-11 NOTE — Progress Notes (Signed)
Pt. Stated that she would put CPAP on herself when ready for bed. Pt. Was made aware to call RT if she needed assistance. CPAP is set up at pt.'s bedside.

## 2012-03-12 DIAGNOSIS — I10 Essential (primary) hypertension: Secondary | ICD-10-CM | POA: Diagnosis not present

## 2012-03-12 DIAGNOSIS — E032 Hypothyroidism due to medicaments and other exogenous substances: Secondary | ICD-10-CM

## 2012-03-12 DIAGNOSIS — R55 Syncope and collapse: Secondary | ICD-10-CM

## 2012-03-12 LAB — BASIC METABOLIC PANEL
BUN: 10 mg/dL (ref 6–23)
Calcium: 9.1 mg/dL (ref 8.4–10.5)
Creatinine, Ser: 0.79 mg/dL (ref 0.50–1.10)
GFR calc non Af Amer: 86 mL/min — ABNORMAL LOW (ref >90)
Glucose, Bld: 94 mg/dL (ref 70–99)
Sodium: 139 mEq/L (ref 135–145)

## 2012-03-12 LAB — CBC
HCT: 32.9 % — ABNORMAL LOW (ref 36.0–46.0)
Hemoglobin: 10.2 g/dL — ABNORMAL LOW (ref 12.0–15.0)
MCH: 29.3 pg (ref 26.0–34.0)
MCHC: 31 g/dL (ref 30.0–36.0)
MCV: 94.5 fL (ref 78.0–100.0)

## 2012-03-12 MED ORDER — AMLODIPINE BESYLATE 5 MG PO TABS
5.0000 mg | ORAL_TABLET | Freq: Every day | ORAL | Status: DC
  Administered 2012-03-12: 5 mg via ORAL
  Filled 2012-03-12 (×2): qty 1

## 2012-03-12 NOTE — Progress Notes (Addendum)
RN was called into the room by the NT. Pt was being assisted to the [facility] and stated she felt like she's going to pass out. Pt was assisted back into the bed. HR on monitor went from 90's up to 117 and back down to 70s. Pt became diaphoretic and dyspneic. After a minute or so back in the bed, the pt stated that she felt like she was recovering now, but still a little sweaty. Secretary informed RN of a 5.76 sec pause. Upon checking VS, pt BP was 155/75. Will continue to monitor the pt.   Kathalene Frames

## 2012-03-12 NOTE — Progress Notes (Signed)
Paged Dr. Radford Pax with [facility]. No new orders received. MD advised to keep Zoll monitor on pt and document episode in the chart. Also, paged TRHFredirick Maudlin, NP. No new orders received. Will continue to monitor pt. [name] [name]

## 2012-03-12 NOTE — Progress Notes (Signed)
Subjective: No CP or SOB. Patient with another episode of LOC and 5.8 sec pause early this morning. Currently asymptomatic and feeling ok.  Objective: Vital signs in last 24 hours: Temp:  [98.1 F (36.7 C)-98.2 F (36.8 C)] 98.1 F (36.7 C) (06/23 0545) Pulse Rate:  [77-93] 77  (06/23 0545) Resp:  [16-21] 16  (06/23 0545) BP: (122-155)/(75-82) 142/78 mmHg (06/23 0545) SpO2:  [96 %-100 %] 96 % (06/23 0545) Weight change:  Last BM Date: 03/10/12  Intake/Output from previous day: 06/22 0701 - 06/23 0700 In: 530 [P.O.:480; IV Piggyback:50] Out: 2475 [Urine:2475] Total I/O In: 360 [P.O.:360] Out: -    Physical Exam: General: Alert, awake, oriented x3, in no acute distress. HEENT: No bruits, no goiter. Heart: Regular rate and rhythm, without murmurs, rubs, gallops. Lungs: Clear to auscultation bilaterally. Abdomen: Soft, nontender, nondistended, positive bowel sounds. Extremities: 1++ edema bilaterally unchanged. Neuro: Grossly intact, nonfocal.  Lab Results: Basic Metabolic Panel:  Basename 03/12/12 0520 03/10/12 0427  NA 139 138  K 3.9 3.7  CL 101 99  CO2 32 34*  GLUCOSE 94 95  BUN 10 11  CREATININE 0.79 0.86  CALCIUM 9.1 9.1  MG -- 2.2  PHOS -- --   CBC:  Basename 03/12/12 0520 03/10/12 0427  WBC 6.2 5.6  NEUTROABS -- --  HGB 10.2* 10.2*  HCT 32.9* 33.3*  MCV 94.5 96.2  PLT 183 204   Cardiac Enzymes:  Basename 03/09/12 1350  CKTOTAL 256*  CKMB 3.3  CKMBINDEX --  TROPONINI <0.30    Medications: Scheduled Meds:    . allopurinol  300 mg Oral Daily  . amitriptyline  100 mg Oral QHS  . amLODipine  5 mg Oral Daily  . antiseptic oral rinse  15 mL Mouth Rinse q12n4p  . antiseptic oral rinse  15 mL Mouth Rinse BID  . aspirin EC  81 mg Oral Daily  . chlorhexidine  15 mL Mouth Rinse BID  . cholecalciferol  1,000 Units Oral Daily  . DULoxetine  30 mg Oral Daily  . furosemide  40 mg Oral Daily  . levothyroxine  200 mcg Oral BID  . omega-3 acid ethyl  esters  2 g Oral Daily  . simvastatin  5 mg Oral q1800  . vitamin C  500 mg Oral Daily  . DISCONTD: cefTRIAXone (ROCEPHIN)  IV  1 g Intravenous Q24H  . DISCONTD: metoprolol tartrate  25 mg Oral BID   Continuous Infusions:  PRN Meds:.acetaminophen, acetaminophen, albuterol, ondansetron (ZOFRAN) IV, ondansetron  Assessment/Plan: 1-Syncope: Patient with two episodes of heart pauses; first one 8.1 seconds pause and second one was 5.8, both with associated LOC. 2-D echo results WNL,and carotid dopplers WNL. CE'z negative as well. No orthostatic changes. Hgb stable. Appreciate cardiology inputs; will now discontinue metoprolol and will use norvasc to help controlling BP. Continue zoll pads. EP to evaluate tomorrow; patient might need a pacemaker.  2-HYPOTHYROIDISM: continue synthroid; TSH and free T4 WNL.   3-HYPERLIPIDEMIA: continue statins.   4-HYPERTENSION: BP rising; will add norvasc 53m daily.  5-C O P D: no wheezing. Will continue supplemental oxygen and PRN inhaler; currently not using maintenance medications.  6-OBSTRUCTIVE SLEEP APNEA: continue CPAP at bedtime.   7-Depression: stable; continue home meds (elavil and cymbalta).   8-Obesity hypoventilation syndrome:continue supplemental oxygen.   9-UTI (lower urinary tract infection): cx unrevealing a specific microorganism; will discontinue rocephin; patient w/o dysuria and has received 3 days of treatment. No fever, no elevated WBC's  10-Obesity: Patient encourage to  follow a low calorie diet and increase activity.   11-GOUT: continue allopurinol.   12-DVT: continue SCD's.     LOS: 4 days   [name] [name] [facility] [phone]  03/12/2012, 9:24 AM

## 2012-03-12 NOTE — Progress Notes (Signed)
@   Subjective:  Denies CP or dyspnea; Near syncopal episode early AM associated with 5.8 sec pause   Objective:  Filed Vitals:   03/11/12 1445 03/11/12 2126 03/12/12 0005 03/12/12 0545  BP: 124/82 122/76 155/75 142/78  Pulse: 93 91 86 77  Temp: 98.2 F (36.8 C) 98.1 F (36.7 C)  98.1 F (36.7 C)  TempSrc: Oral Oral Axillary Oral  Resp: 20 21  16   Height:      Weight:      SpO2: 100% 98% 99% 96%    Intake/Output from previous day:  Intake/Output Summary (Last 24 hours) at 03/12/12 0716 Last data filed at 03/12/12 1115  Gross per 24 hour  Intake    530 ml  Output   2475 ml  Net  -1945 ml    Physical Exam: Physical exam: Well-developed morbidly obese in no acute distress.  Skin is warm and dry.  HEENT is normal.  Neck is supple.  Chest is clear to auscultation with normal expansion.  Cardiovascular exam is regular rate and rhythm.  Abdominal exam nontender or distended. No masses palpated. Extremities show trace edema. neuro grossly intact    Lab Results: Basic Metabolic Panel:  Basename 03/12/12 0520 03/10/12 0427  NA 139 138  K 3.9 3.7  CL 101 99  CO2 32 34*  GLUCOSE 94 95  BUN 10 11  CREATININE 0.79 0.86  CALCIUM 9.1 9.1  MG -- 2.2  PHOS -- --   CBC:  Basename 03/12/12 0520 03/10/12 0427  WBC 6.2 5.6  NEUTROABS -- --  HGB 10.2* 10.2*  HCT 32.9* 33.3*  MCV 94.5 96.2  PLT 183 204   Cardiac Enzymes:  Basename 03/09/12 1350  CKTOTAL 256*  CKMB 3.3  CKMBINDEX --  TROPONINI <0.30     Assessment/Plan:  1) syncope - documented 8 sec pause; recurrent presyncopal episode early AM after using bedside commode associated with 5.8 second pause. LV function normal by echo, ECG normal, enzymes negative; will discontinue metoprolol; medroxyprogesterone has been discontinued; continue telemetry. She will most likely need pacemaker. Continue zoll pacemaker at bedside. Will review with EP in AM. 2) Hypertension - Since beta blocker has been weaned to off,  add norvasc 5 mg po daily 3) UTI - continue antibiotics.   [name] [name] 03/12/2012, 7:16 AM

## 2012-03-13 ENCOUNTER — Other Ambulatory Visit: Payer: Self-pay | Admitting: *Deleted

## 2012-03-13 DIAGNOSIS — I10 Essential (primary) hypertension: Secondary | ICD-10-CM

## 2012-03-13 DIAGNOSIS — R55 Syncope and collapse: Secondary | ICD-10-CM

## 2012-03-13 DIAGNOSIS — E032 Hypothyroidism due to medicaments and other exogenous substances: Secondary | ICD-10-CM

## 2012-03-13 MED ORDER — FUROSEMIDE 40 MG PO TABS: 40.0000 mg | ORAL_TABLET | Freq: Every day | ORAL | Status: DC

## 2012-03-13 MED ORDER — AMLODIPINE BESYLATE 5 MG PO TABS: 5.0000 mg | ORAL_TABLET | Freq: Every day | ORAL | Status: DC

## 2012-03-13 NOTE — Discharge Summary (Signed)
Physician Discharge Summary  Patient ID: [name] [name] MRN: [id number] DOB/AGE: Aug 17, 1946 66 y.o.  Admit date: 03/08/2012 Discharge date: 03/13/2012  Primary Care Physician:  [name], MD   Discharge Diagnoses:   1-Syncope (thought to be associated with use of metoprolol and provera) 2-Heart pause (8.1 seconds and 5.6 seconds; two episodes while hospitalized) 3-OBSTRUCTIVE SLEEP APNEA 4-Obesity hypoventilation syndrome 5-HYPOTHYROIDISM 6-HYPERTENSION 7-HYPERLIPIDEMIA 8-Depression 9-C O P D 10-Obesity  Medication List  As of 03/13/2012  9:01 AM   STOP taking these medications         metoprolol 100 MG tablet         TAKE these medications         allopurinol 300 MG tablet   Commonly known as: ZYLOPRIM   Take 300 mg by mouth daily.      amitriptyline 100 MG tablet   Commonly known as: ELAVIL   Take 1 tablet (100 mg total) by mouth at bedtime.      amLODipine 5 MG tablet   Commonly known as: NORVASC   Take 1 tablet (5 mg total) by mouth daily.      aspirin 81 MG tablet   Take 81 mg by mouth daily.      DULoxetine 30 MG capsule   Commonly known as: CYMBALTA   Take 30 mg by mouth daily. Take 2 at bedtime      fish oil-omega-3 fatty acids 1000 MG capsule   Take 2 g by mouth daily.      furosemide 40 MG tablet   Commonly known as: LASIX   Take 1 tablet (40 mg total) by mouth daily.      levothyroxine 200 MCG tablet   Commonly known as: SYNTHROID, LEVOTHROID   Take 200 mcg by mouth 2 (two) times daily.      potassium chloride 10 MEQ CR tablet   Commonly known as: KLOR-CON   Take 10 mEq by mouth daily.      pravastatin 40 MG tablet   Commonly known as: PRAVACHOL   Take 40 mg by mouth daily.      PROAIR HFA 108 (90 BASE) MCG/ACT inhaler   Generic drug: albuterol   Inhale 2 puffs into the lungs every 6 (six) hours as needed.      vitamin C 500 MG tablet   Commonly known as: ASCORBIC ACID   Take 500 mg by mouth daily.      Vitamin D3 2000 UNITS Tabs   Take 1 tablet by mouth daily.             Disposition and Follow-up: Patient discharge in stable and improved condition; she will follow with EP-cardiology for event monitoring device and further decision regarding pacemaker if needed. Instructed to stop provera and also metoprolol. For BP she was started on Norvasc now; she will follow with PCP in 2 weeks for further assessment of chronic medical problems and adjustment of her BP if needed.  Consults:   Cardiology Velora Heckler)   Significant Diagnostic Studies:  Dg Chest 2 View  03/08/2012  *RADIOLOGY REPORT*  Clinical Data: Syncope, history hypertension, asthma, COPD, breast cancer  CHEST - 2 VIEW  Comparison: 05/11/2011  Findings: Enlargement of cardiac silhouette. Tortuous aorta. Slight pulmonary vascular congestion. No acute failure or consolidation. No pleural effusion or pneumothorax. Prior cervical spine fusion. Bilateral glenohumeral degenerative changes.  IMPRESSION: Enlargement of cardiac silhouette with pulmonary vascular congestion. No acute abnormalities.  Original Report Authenticated By: Burnetta Sabin, M.D.   Ct Head Wo  Contrast  03/08/2012  *RADIOLOGY REPORT*  Clinical Data: Fall yesterday with hematoma and abrasion to the right frontal scalp.  Weakness.  Loss of consciousness.  CT HEAD WITHOUT CONTRAST  Technique:  Contiguous axial images were obtained from the base of the skull through the vertex without contrast.  Comparison: CT head without contrast 02/16/2010.  Findings: No acute cortical infarct, hemorrhage, or mass lesion is present.  The ventricles are normal size.  No significant extra- axial fluid collection is present.  Minimal soft tissue swelling is present in the right paramidline frontal scalp.  There is no underlying fracture.  No additional soft tissue injury is present.  The paranasal sinuses and mastoid air cells are clear.  IMPRESSION:  1.  Negative CT appearance of the brain. 2.  No acute fracture. 3.  Minimal  soft tissue swelling in the right paramidline scalp without underlying fracture.  Original Report Authenticated By: Resa Miner. MATTERN, M.D.    Brief H and P: For complete details please refer to admission H and P, but in brief 66 y/o female with pmh as described below; came into the hospital due to syncope episode that happened 24 hours PTA. Patient has been experiencing palpitations and also some difficulty breathing as prodromic symptoms; initially she associated her symptoms to panic attacks and was avoiding having herself check. After experiencing this syncope event she was advised by PCP to come to ED for further evaluation and treatment.  In ED she was found with UTI, normal CT of head, no acute ischemic changes on EKG and negative troponin; BP was soft on admission.  Patient denies CP, SOB, fever, abdominal pain, cough, HA's or any other acute complaints.     Hospital Course:  1-Syncope: Patient with two episodes of heart pauses; first one 8.1 seconds pause and second one was 5.8, both with associated LOC/presyncope respectively. 2-D echo results WNL,and carotid dopplers WNL. CE'z negative as well. No orthostatic changes. Hgb stable. Presumed to be related with use of B-blockers and provera. Both drugs has been discontinue and plan is for 21 days event monitoring device and further follow up by cardiology to decide on pacemaker placement.  2-HYPOTHYROIDISM: continue synthroid; TSH and free T4 WNL.   3-HYPERLIPIDEMIA: continue statins.   4-HYPERTENSION: BP rising after b-blocker discontinue; norvasc 64m daily has been added to regimen and she advised to follow a low sodium diet.   5-C O P D: no wheezing. Will continue supplemental oxygen and PRN albuterol inhaler.  6-OBSTRUCTIVE SLEEP APNEA: continue CPAP at bedtime.   7-Depression: stable; continue home meds (elavil and cymbalta).   8-Obesity hypoventilation syndrome:continue supplemental oxygen.   9-UTI (lower urinary tract  infection): cx unrevealing specific microorganism; patient w/o dysuria and has received 3 days of IV treatment. No fever, no elevated WBC's.  10-Obesity: Patient encourage to follow a low calorie diet and increase activity.   11-GOUT: continue allopurinol.   12-Dysfunctional uterine bleeding: provera discontinue due to heart pauses; she will follow with OB/GYN for alternative treatments.   Time spent on Discharge: 40 minutes  Signed: Maxfield Gildersleeve 03/13/2012, 9:01 AM

## 2012-03-13 NOTE — Progress Notes (Signed)
0050-Pt placed self on CPAP and tolerating well at this time, RT to monitor and assess as needed

## 2012-03-13 NOTE — Progress Notes (Addendum)
Subjective:  No further dizzy spells.  No further pauses on telemetry. Remains in NSR  Objective:  Vital Signs in the last 24 hours: Temp:  [98.5 F (36.9 C)-98.7 F (37.1 C)] 98.5 F (36.9 C) (06/24 0539) Pulse Rate:  [89-94] 94  (06/24 0539) Resp:  [18-19] 19  (06/24 0539) BP: (114-136)/(69-77) 120/75 mmHg (06/24 0539) SpO2:  [95 %-99 %] 95 % (06/24 0539)  Intake/Output from previous day: 06/23 0701 - 06/24 0700 In: 840 [P.O.:840] Out: 2100 [Urine:2100] Intake/Output from this shift:       . allopurinol  300 mg Oral Daily  . amitriptyline  100 mg Oral QHS  . amLODipine  5 mg Oral Daily  . antiseptic oral rinse  15 mL Mouth Rinse q12n4p  . antiseptic oral rinse  15 mL Mouth Rinse BID  . aspirin EC  81 mg Oral Daily  . chlorhexidine  15 mL Mouth Rinse BID  . cholecalciferol  1,000 Units Oral Daily  . DULoxetine  30 mg Oral Daily  . furosemide  40 mg Oral Daily  . levothyroxine  200 mcg Oral BID  . omega-3 acid ethyl esters  2 g Oral Daily  . simvastatin  5 mg Oral q1800  . vitamin C  500 mg Oral Daily  . DISCONTD: cefTRIAXone (ROCEPHIN)  IV  1 g Intravenous Q24H      Physical Exam: The patient appears to be in no distress. Markedly obese.  Head and neck exam reveals that the pupils are equal and reactive.  The extraocular movements are full.  There is no scleral icterus.  Mouth and pharynx are benign.  No lymphadenopathy.  No carotid bruits.  The jugular venous pressure is normal.  Thyroid is not enlarged or tender.  Chest is clear to percussion and auscultation.  No rales or rhonchi.  Expansion of the chest is symmetrical.  Heart reveals no abnormal lift or heave.  First and second heart sounds are normal.  There is no murmur gallop rub or click.  The abdomen is large, soft and nontender.  Bowel sounds are normoactive.  There is no hepatosplenomegaly or mass.  There are no abdominal bruits.  Extremities reveal no phlebitis or edema.  Pedal pulses are good.   There is no cyanosis or clubbing.  Neurologic exam is normal strength and no lateralizing weakness.  No sensory deficits.  Integument reveals no rash  Lab Results:  Basename 03/12/12 0520  WBC 6.2  HGB 10.2*  PLT 183    Basename 03/12/12 0520  NA 139  K 3.9  CL 101  CO2 32  GLUCOSE 94  BUN 10  CREATININE 0.79   No results found for this basename: TROPONINI:2,CK,MB:2 in the last 72 hours Hepatic Function Panel No results found for this basename: PROT,ALBUMIN,AST,ALT,ALKPHOS,BILITOT,BILIDIR,IBILI in the last 72 hours No results found for this basename: CHOL in the last 72 hours No results found for this basename: PROTIME in the last 72 hours  Imaging: No results found.  Cardiac Studies: Telemetry shows NSR, no further long pauses. Assessment/Plan:  Patient Active Hospital Problem List: Syncope (03/08/2012)   Assessment: Improved since off metoprolol and off medroxyprogesterone   Plan: Continue to observe on telemetry. Will discuss with EP today.  Possibly home soon with outpatient event monitor. BP okay off metoprolol.  Addendum:  Discussed with Dr. Caryl Comes this am.  Faythe Ghee for discharge from cardiology standpoint today and patient should come by [facility] at [address] and get a 21 day  event monitor today.   LOS: 5 days    Darlin Coco 03/13/2012, 7:31 AM

## 2012-03-13 NOTE — Discharge Instructions (Signed)
Syncope You have had a fainting (syncopal) spell. A fainting episode is a sudden, short-lived loss of consciousness. It results in complete recovery. It occurs because there has been a temporary shortage of oxygen and/or sugar (glucose) to the brain. CAUSES   Blood pressure pills and other medications that may lower blood pressure below normal. Sudden changes in posture (sudden standing).   Over-medication. Take your medications as directed.   Standing too long. This can cause blood to pool in the legs.   Seizure disorders.   Low blood sugar (hypoglycemia) of diabetes. This more commonly causes coma.   Bearing down to go to the bathroom. This can cause your blood pressure to rise suddenly. Your body compensates by making the blood pressure too low when you stop bearing down.   Hardening of the arteries where the brain temporarily does not receive enough blood.   Irregular heart beat and circulatory problems.   Fear, emotional distress, injury, sight of blood, or illness.  Your caregiver will send you home if the syncope was from non-worrisome causes (benign). Depending on your age and health, you may stay to be monitored and observed. If you return home, have someone stay with you if your caregiver feels that is desirable. It is very important to keep all follow-up referrals and appointments in order to properly manage this condition. This is a serious problem which can lead to serious illness and death if not carefully managed.  WARNING: Do not drive or operate machinery until your caregiver feels that it is safe for you to do so. SEEK IMMEDIATE MEDICAL CARE IF:   You have another fainting episode or faint while lying or sitting down. DO NOT DRIVE YOURSELF. Call 911 if no other help is available.   You have chest pain, are feeling sick to your stomach (nausea), vomiting or abdominal pain.   You have an irregular heartbeat or one that is very fast (pulse over 120 beats per minute).    You have a loss of feeling in some part of your body or lose movement in your arms or legs.   You have difficulty with speech, confusion, severe weakness, or visual problems.   You become sweaty and/or feel light headed.  Make sure you are rechecked as instructed. Document Released: 09/06/2005 Document Revised: 08/26/2011 Document Reviewed: 04/27/2007 [facility] Patient Information 2012 [place].

## 2012-03-14 ENCOUNTER — Encounter (INDEPENDENT_AMBULATORY_CARE_PROVIDER_SITE_OTHER): Payer: Medicare Other

## 2012-03-14 DIAGNOSIS — R55 Syncope and collapse: Secondary | ICD-10-CM | POA: Diagnosis not present

## 2012-03-16 ENCOUNTER — Ambulatory Visit: Payer: Medicare Other | Admitting: Pulmonary Disease

## 2012-03-27 ENCOUNTER — Emergency Department (HOSPITAL_COMMUNITY): Payer: Medicare Other

## 2012-03-27 ENCOUNTER — Inpatient Hospital Stay (HOSPITAL_COMMUNITY)
Admission: EM | Admit: 2012-03-27 | Discharge: 2012-03-30 | DRG: 330 | Disposition: A | Discharge status: Home / Self Care | Payer: Medicare Other | Attending: Internal Medicine | Admitting: Internal Medicine

## 2012-03-27 ENCOUNTER — Encounter (HOSPITAL_COMMUNITY): Payer: Self-pay | Admitting: *Deleted

## 2012-03-27 DIAGNOSIS — I10 Essential (primary) hypertension: Secondary | ICD-10-CM

## 2012-03-27 DIAGNOSIS — R0602 Shortness of breath: Secondary | ICD-10-CM | POA: Diagnosis not present

## 2012-03-27 DIAGNOSIS — E876 Hypokalemia: Secondary | ICD-10-CM | POA: Diagnosis present

## 2012-03-27 DIAGNOSIS — Z853 Personal history of malignant neoplasm of breast: Secondary | ICD-10-CM

## 2012-03-27 DIAGNOSIS — R079 Chest pain, unspecified: Secondary | ICD-10-CM | POA: Diagnosis not present

## 2012-03-27 DIAGNOSIS — R1013 Epigastric pain: Secondary | ICD-10-CM | POA: Diagnosis not present

## 2012-03-27 DIAGNOSIS — I2510 Atherosclerotic heart disease of native coronary artery without angina pectoris: Secondary | ICD-10-CM | POA: Diagnosis not present

## 2012-03-27 DIAGNOSIS — I499 Cardiac arrhythmia, unspecified: Secondary | ICD-10-CM | POA: Diagnosis not present

## 2012-03-27 DIAGNOSIS — R109 Unspecified abdominal pain: Secondary | ICD-10-CM | POA: Diagnosis not present

## 2012-03-27 DIAGNOSIS — F3289 Other specified depressive episodes: Secondary | ICD-10-CM | POA: Diagnosis present

## 2012-03-27 DIAGNOSIS — G4733 Obstructive sleep apnea (adult) (pediatric): Secondary | ICD-10-CM | POA: Diagnosis present

## 2012-03-27 DIAGNOSIS — J4489 Other specified chronic obstructive pulmonary disease: Secondary | ICD-10-CM | POA: Diagnosis present

## 2012-03-27 DIAGNOSIS — D72829 Elevated white blood cell count, unspecified: Secondary | ICD-10-CM | POA: Diagnosis present

## 2012-03-27 DIAGNOSIS — E662 Morbid (severe) obesity with alveolar hypoventilation: Secondary | ICD-10-CM | POA: Diagnosis present

## 2012-03-27 DIAGNOSIS — J449 Chronic obstructive pulmonary disease, unspecified: Secondary | ICD-10-CM | POA: Diagnosis present

## 2012-03-27 DIAGNOSIS — E039 Hypothyroidism, unspecified: Secondary | ICD-10-CM | POA: Diagnosis present

## 2012-03-27 DIAGNOSIS — E785 Hyperlipidemia, unspecified: Secondary | ICD-10-CM

## 2012-03-27 DIAGNOSIS — R55 Syncope and collapse: Secondary | ICD-10-CM

## 2012-03-27 DIAGNOSIS — Z6841 Body Mass Index (BMI) 40.0 and over, adult: Secondary | ICD-10-CM

## 2012-03-27 DIAGNOSIS — K3580 Unspecified acute appendicitis: Secondary | ICD-10-CM | POA: Diagnosis not present

## 2012-03-27 DIAGNOSIS — K3533 Acute appendicitis with perforation and localized peritonitis, with abscess: Principal | ICD-10-CM | POA: Diagnosis present

## 2012-03-27 DIAGNOSIS — C50919 Malignant neoplasm of unspecified site of unspecified female breast: Secondary | ICD-10-CM | POA: Diagnosis not present

## 2012-03-27 DIAGNOSIS — F329 Major depressive disorder, single episode, unspecified: Secondary | ICD-10-CM | POA: Diagnosis present

## 2012-03-27 DIAGNOSIS — D259 Leiomyoma of uterus, unspecified: Secondary | ICD-10-CM | POA: Diagnosis present

## 2012-03-27 LAB — HEPATIC FUNCTION PANEL
ALT: 67 U/L — ABNORMAL HIGH (ref 0–35)
AST: 60 U/L — ABNORMAL HIGH (ref 0–37)
Albumin: 3.4 g/dL — ABNORMAL LOW (ref 3.5–5.2)
Alkaline Phosphatase: 89 U/L (ref 39–117)
Bilirubin, Direct: 0.1 mg/dL (ref 0.0–0.3)
Total Bilirubin: 0.6 mg/dL (ref 0.3–1.2)

## 2012-03-27 LAB — URINALYSIS, ROUTINE W REFLEX MICROSCOPIC
Bilirubin Urine: NEGATIVE
Glucose, UA: NEGATIVE mg/dL
Hgb urine dipstick: NEGATIVE
Ketones, ur: NEGATIVE mg/dL
Leukocytes, UA: NEGATIVE
Nitrite: NEGATIVE
Protein, ur: NEGATIVE mg/dL
Specific Gravity, Urine: 1.013 (ref 1.005–1.030)
Urobilinogen, UA: 0.2 mg/dL (ref 0.0–1.0)
pH: 7.5 (ref 5.0–8.0)

## 2012-03-27 LAB — BASIC METABOLIC PANEL
BUN: 13 mg/dL (ref 6–23)
CO2: 32 mEq/L (ref 19–32)
Calcium: 9.6 mg/dL (ref 8.4–10.5)
Chloride: 98 mEq/L (ref 96–112)
Creatinine, Ser: 0.78 mg/dL (ref 0.50–1.10)
GFR calc Af Amer: >90 mL/min (ref >90)
GFR calc non Af Amer: 86 mL/min — ABNORMAL LOW (ref >90)
Glucose, Bld: 116 mg/dL — ABNORMAL HIGH (ref 70–99)
Potassium: 3.4 mEq/L — ABNORMAL LOW (ref 3.5–5.1)
Sodium: 139 mEq/L (ref 135–145)

## 2012-03-27 LAB — CBC WITH DIFFERENTIAL/PLATELET
Basophils Absolute: 0 10*3/uL (ref 0.0–0.1)
Basophils Relative: 0 % (ref 0–1)
Eosinophils Absolute: 0.2 10*3/uL (ref 0.0–0.7)
Eosinophils Relative: 2 % (ref 0–5)
HCT: 35.9 % — ABNORMAL LOW (ref 36.0–46.0)
Hemoglobin: 11.5 g/dL — ABNORMAL LOW (ref 12.0–15.0)
Lymphocytes Relative: 17 % (ref 12–46)
Lymphs Abs: 1.4 10*3/uL (ref 0.7–4.0)
MCH: 29.1 pg (ref 26.0–34.0)
MCHC: 32 g/dL (ref 30.0–36.0)
MCV: 90.9 fL (ref 78.0–100.0)
Monocytes Absolute: 0.6 10*3/uL (ref 0.1–1.0)
Monocytes Relative: 7 % (ref 3–12)
Neutro Abs: 6.3 10*3/uL (ref 1.7–7.7)
Neutrophils Relative %: 74 % (ref 43–77)
Platelets: 218 10*3/uL (ref 150–400)
RBC: 3.95 MIL/uL (ref 3.87–5.11)
RDW: 13.8 % (ref 11.5–15.5)
WBC: 8.5 10*3/uL (ref 4.0–10.5)

## 2012-03-27 LAB — POCT I-STAT TROPONIN I
Troponin i, poc: 0 ng/mL (ref 0.00–0.08)
Troponin i, poc: 0 ng/mL (ref 0.00–0.08)

## 2012-03-27 LAB — PRO B NATRIURETIC PEPTIDE: Pro B Natriuretic peptide (BNP): 54.9 pg/mL (ref 0–125)

## 2012-03-27 LAB — CBC
HCT: 33 % — ABNORMAL LOW (ref 36.0–46.0)
Hemoglobin: 10.6 g/dL — ABNORMAL LOW (ref 12.0–15.0)
MCH: 28.9 pg (ref 26.0–34.0)
MCHC: 32.1 g/dL (ref 30.0–36.0)
MCV: 89.9 fL (ref 78.0–100.0)
Platelets: 200 10*3/uL (ref 150–400)
RBC: 3.67 MIL/uL — ABNORMAL LOW (ref 3.87–5.11)
RDW: 14.1 % (ref 11.5–15.5)
WBC: 10.1 10*3/uL (ref 4.0–10.5)

## 2012-03-27 LAB — CARDIAC PANEL(CRET KIN+CKTOT+MB+TROPI): Troponin I: <0.3 ng/mL (ref <0.30)

## 2012-03-27 LAB — LIPASE, BLOOD: Lipase: 17 U/L (ref 11–59)

## 2012-03-27 MED ORDER — ALUM & MAG HYDROXIDE-SIMETH 200-200-20 MG/5ML PO SUSP
30.0000 mL | ORAL | Status: DC | PRN
  Administered 2012-03-27: 30 mL via ORAL
  Filled 2012-03-27: qty 30

## 2012-03-27 MED ORDER — VITAMIN D3 25 MCG (1000 UNIT) PO TABS
2000.0000 [IU] | ORAL_TABLET | Freq: Every day | ORAL | Status: DC
  Administered 2012-03-27 – 2012-03-30 (×3): 2000 [IU] via ORAL
  Filled 2012-03-27 (×4): qty 2

## 2012-03-27 MED ORDER — POTASSIUM CHLORIDE CRYS ER 10 MEQ PO TBCR: 30.0000 meq | EXTENDED_RELEASE_TABLET | Freq: Once | ORAL | Status: DC

## 2012-03-27 MED ORDER — ALLOPURINOL 300 MG PO TABS
300.0000 mg | ORAL_TABLET | Freq: Every day | ORAL | Status: DC
  Administered 2012-03-28 – 2012-03-30 (×2): 300 mg via ORAL
  Filled 2012-03-27 (×3): qty 1

## 2012-03-27 MED ORDER — OMEGA-3-ACID ETHYL ESTERS 1 G PO CAPS
2.0000 g | ORAL_CAPSULE | Freq: Every day | ORAL | Status: DC
  Administered 2012-03-27 – 2012-03-30 (×3): 2 g via ORAL
  Filled 2012-03-27 (×4): qty 2

## 2012-03-27 MED ORDER — AMITRIPTYLINE HCL 100 MG PO TABS
100.0000 mg | ORAL_TABLET | Freq: Every day | ORAL | Status: DC
  Administered 2012-03-27 – 2012-03-29 (×3): 100 mg via ORAL
  Filled 2012-03-27 (×4): qty 1

## 2012-03-27 MED ORDER — HEPARIN SODIUM (PORCINE) 5000 UNIT/ML IJ SOLN
5000.0000 [IU] | Freq: Three times a day (TID) | INTRAMUSCULAR | Status: DC
  Administered 2012-03-27 – 2012-03-29 (×5): 5000 [IU] via SUBCUTANEOUS
  Filled 2012-03-27 (×8): qty 1

## 2012-03-27 MED ORDER — NITROGLYCERIN 0.4 MG SL SUBL
0.4000 mg | SUBLINGUAL_TABLET | SUBLINGUAL | Status: DC | PRN
  Administered 2012-03-27: 0.4 mg via SUBLINGUAL
  Filled 2012-03-27: qty 25

## 2012-03-27 MED ORDER — VITAMIN D3 50 MCG (2000 UT) PO TABS: 1.0000 | ORAL_TABLET | Freq: Every day | ORAL | Status: DC

## 2012-03-27 MED ORDER — FUROSEMIDE 40 MG PO TABS
40.0000 mg | ORAL_TABLET | Freq: Two times a day (BID) | ORAL | Status: DC
  Administered 2012-03-27 – 2012-03-30 (×5): 40 mg via ORAL
  Filled 2012-03-27 (×7): qty 1

## 2012-03-27 MED ORDER — SODIUM CHLORIDE 0.9 % IJ SOLN: 3.0000 mL | INTRAMUSCULAR | Status: DC | PRN

## 2012-03-27 MED ORDER — OMEGA-3 FATTY ACIDS 1000 MG PO CAPS: 2.0000 g | ORAL_CAPSULE | Freq: Every day | ORAL | Status: DC

## 2012-03-27 MED ORDER — ASPIRIN 81 MG PO TABS: 81.0000 mg | ORAL_TABLET | Freq: Every day | ORAL | Status: DC

## 2012-03-27 MED ORDER — ALBUTEROL SULFATE HFA 108 (90 BASE) MCG/ACT IN AERS
2.0000 | INHALATION_SPRAY | Freq: Four times a day (QID) | RESPIRATORY_TRACT | Status: DC | PRN
  Filled 2012-03-27: qty 6.7

## 2012-03-27 MED ORDER — SODIUM CHLORIDE 0.9 % IJ SOLN
3.0000 mL | Freq: Two times a day (BID) | INTRAMUSCULAR | Status: DC
  Administered 2012-03-27 – 2012-03-30 (×5): 3 mL via INTRAVENOUS

## 2012-03-27 MED ORDER — LEVOTHYROXINE SODIUM 200 MCG PO TABS
200.0000 ug | ORAL_TABLET | Freq: Two times a day (BID) | ORAL | Status: DC
  Administered 2012-03-27 – 2012-03-30 (×5): 200 ug via ORAL
  Filled 2012-03-27 (×7): qty 1

## 2012-03-27 MED ORDER — FUROSEMIDE 40 MG PO TABS: 40.0000 mg | ORAL_TABLET | Freq: Every day | ORAL | Status: DC

## 2012-03-27 MED ORDER — GI COCKTAIL ~~LOC~~
30.0000 mL | Freq: Once | ORAL | Status: AC
  Administered 2012-03-27: 30 mL via ORAL
  Filled 2012-03-27: qty 30

## 2012-03-27 MED ORDER — SODIUM CHLORIDE 0.9 % IV SOLN
250.0000 mL | INTRAVENOUS | Status: DC | PRN
  Administered 2012-03-29: 10:00:00 via INTRAVENOUS

## 2012-03-27 MED ORDER — AMLODIPINE BESYLATE 5 MG PO TABS
5.0000 mg | ORAL_TABLET | Freq: Every day | ORAL | Status: DC
  Administered 2012-03-28 – 2012-03-30 (×2): 5 mg via ORAL
  Filled 2012-03-27 (×3): qty 1

## 2012-03-27 MED ORDER — DULOXETINE HCL 30 MG PO CPEP
30.0000 mg | ORAL_CAPSULE | Freq: Every day | ORAL | Status: DC
  Administered 2012-03-27 – 2012-03-30 (×3): 30 mg via ORAL
  Filled 2012-03-27 (×4): qty 1

## 2012-03-27 MED ORDER — NITROGLYCERIN 0.4 MG SL SUBL: 0.4000 mg | SUBLINGUAL_TABLET | SUBLINGUAL | Status: DC | PRN

## 2012-03-27 MED ORDER — POTASSIUM CHLORIDE CRYS ER 10 MEQ PO TBCR
10.0000 meq | EXTENDED_RELEASE_TABLET | Freq: Every morning | ORAL | Status: DC
  Administered 2012-03-28: 10 meq via ORAL
  Filled 2012-03-27 (×2): qty 1

## 2012-03-27 MED ORDER — ACETAMINOPHEN 325 MG PO TABS
650.0000 mg | ORAL_TABLET | ORAL | Status: DC | PRN
  Administered 2012-03-27 – 2012-03-30 (×3): 650 mg via ORAL
  Filled 2012-03-27 (×3): qty 2

## 2012-03-27 MED ORDER — ONDANSETRON HCL 4 MG/2ML IJ SOLN: 4.0000 mg | Freq: Four times a day (QID) | INTRAMUSCULAR | Status: DC | PRN

## 2012-03-27 MED ORDER — ASPIRIN EC 81 MG PO TBEC
81.0000 mg | DELAYED_RELEASE_TABLET | Freq: Every day | ORAL | Status: DC
  Administered 2012-03-28 – 2012-03-30 (×2): 81 mg via ORAL
  Filled 2012-03-27 (×3): qty 1

## 2012-03-27 MED ORDER — FUROSEMIDE 10 MG/ML IJ SOLN: 40.0000 mg | Freq: Once | INTRAMUSCULAR | Status: DC

## 2012-03-27 MED ORDER — ASPIRIN 81 MG PO CHEW: 324.0000 mg | CHEWABLE_TABLET | ORAL | Status: AC

## 2012-03-27 MED ORDER — VITAMIN C 500 MG PO TABS
500.0000 mg | ORAL_TABLET | Freq: Every day | ORAL | Status: DC
  Administered 2012-03-28 – 2012-03-30 (×2): 500 mg via ORAL
  Filled 2012-03-27 (×3): qty 1

## 2012-03-27 MED ORDER — SIMVASTATIN 20 MG PO TABS
20.0000 mg | ORAL_TABLET | Freq: Every day | ORAL | Status: DC
  Administered 2012-03-28 – 2012-03-29 (×2): 20 mg via ORAL
  Filled 2012-03-27 (×3): qty 1

## 2012-03-27 MED ORDER — ASPIRIN 300 MG RE SUPP: 300.0000 mg | RECTAL | Status: AC

## 2012-03-27 NOTE — ED Notes (Signed)
After administration of 1 SL Ntg pts BP dropped to 75/40, 61/45. Pt remained A&Ox's 3. Skin warm, dry, pink and intact. Pt c/o warm and hot feeling. Elmyra Ricks PA notified and to bedside. Verbal order given to bolus pt 500cc of NS. Bolus initiated. Will continue to monitor pt.

## 2012-03-27 NOTE — ED Notes (Signed)
Attempted to call report.  Nurse unavailable.

## 2012-03-27 NOTE — ED Notes (Signed)
UAO:[id number]<RU> Expected date:03/27/12<BR> Expected time:11:21 AM<BR> Means of arrival:Ambulance<BR> Comments:<BR> 65yoF,chest tightness

## 2012-03-27 NOTE — Progress Notes (Signed)
Pt with temp 101.0 orally.  Tylenol 684m given.  MD on call notified.  No new orders at this time.  Will continue to monitor. CWendee BeaversSMayfield

## 2012-03-27 NOTE — ED Notes (Signed)
Pt states "I was just released from hospital on June 25th and they were treating me for a UTI."

## 2012-03-27 NOTE — ED Notes (Signed)
Per EMS pt in from home c/o chest tightness, pt wearing holter monitor from cardiologist. Pt took 367m aspirin pta. Pt c/o pain to epigastric area that "moves around." 22G R hand. Pt 02 dependent at 2L.

## 2012-03-27 NOTE — Consult Note (Signed)
Patient ID: [name] [name] MRN: [id number], DOB/AGE: Apr 15, 1946   Admit date: 03/27/2012 Date of Consult: @TODAY @  Primary Physician: [name], MD Primary Cardiologist:     Problem List: Past Medical History  Diagnosis Date  . Other and unspecified hyperlipidemia   . Unspecified essential hypertension   . Anemia   . CAD (coronary artery disease)     a. Nonobstructive CAD 2007 (EF 75%.  RCA  proximal 75% tubular, 25% prox LAD, 25% d1, 50% D2) at [facility]. b. NSTEMI 01/2010 in setting of respiratory failure, medical approach (not a good candidate for noninvasive eval)  . OSA on CPAP   . Hypothyroidism   . Peripheral edema 06/13/2011  . Asthma 06/13/2011  . Depression 06/13/2011  . Neuropathy 06/13/2011  . Cervical radiculopathy 06/13/2011  . Gout 06/13/2011  . Obesity hypoventilation syndrome 06/13/2011  . COPD (chronic obstructive pulmonary disease)   . Breast cancer dx'd 2005    left  . Cellulitis     hx of cellulitis in left leg    Past Surgical History  Procedure Date  . Ovarian cyst removal   . Breast lumpectomy   . Cervical disc surgery     have cadaver bones,, screws, and plates   . Cervical biopsy June 2013    at [facility] for Heavy  Bleeding     Allergies:  Allergies  Allergen Reactions  . Codeine     REACTION: unknown  . Lisinopril     REACTION: cough    HPI: Asked to see re CP. Patient is a 66 yr old who presents to ER with chest tightness.  Took ASA and came to ER.  With SL NTG patient transiently hypotensive.  Given fluids.  Patient says that problems began around Mother's day.  It was at that time that she increased Provera.  After that felt like she was having panic attacks.  Felt dizzy.  SOB  On day of admit in June in bedroom  Sat down on bed.  Next thing found on floor. No prodrome. Admitted.  Echo showed normal LV function.  Carotid doppler showed no signif ICA stenosis.   Had been on 150/50 metoprolol and 2 Provera.  "Panic spells" in hosp correl with 5 sec  pause.  1 syncopal spell in hosp with 8 sec pause.  Since d/c has had 4 "panic spells" but no syncope.   Today is different.  Never had pain before Up late last night 11PM.  Took shower.  Got monitor back on.  Got in bed. Felt pain in epigastric area.  Tight.   Then went to back and around to L side. No diaphoresis  No SOB.  Uses CPAP  On O2 24/7 Hurting.  Never had even though  Had heart attack couple years ago. Pain would not go away.   Lasted all night.  Pain 9/10 when started. Not pleuritic.  Not moving. In AM called neighbor.  Neighbor came over.  Called 911.  Hurting bad until got here.  Given NTG  And ASA. Pain went away.    Transiently hypotensive.  Responded to fluids  Currently still with a little abdominal pain  Worse with pressing on belly in ER this AM.  Denies reflux.  No dysphagia. No fevers or cough.  No vomiting or diarrhea.  Inpatient Medications:    . gi cocktail  30 mL Oral Once    Family History  Problem Relation Age of Onset  . Breast cancer    . Uterine cancer Mother  History   Social History  . Marital Status: Divorced    Spouse Name: N/A    Number of Children: N/A  . Years of Education: N/A   Occupational History  . Not on file.   Social History Main Topics  . Smoking status: Former Smoker -- 1.0 packs/day for 20 years    Types: Cigarettes    Quit date: 09/20/1998  . Smokeless tobacco: Never Used   Comment: 1ppd x 20 years  . Alcohol Use: Yes     rare  . Drug Use: No  . Sexually Active: Not Currently    Birth Control/ Protection: Post-menopausal   Other Topics Concern  . Not on file   Social History Narrative  . No narrative on file     Review of Systems: General: negative for chills, fever, night sweats or weight changes.  Cardiovascular: negative for chest pain, dyspnea on exertion, edema, orthopnea, palpitations, paroxysmal nocturnal dyspnea or shortness of breath Dermatological: negative for rash Respiratory: negative for  cough  Uses CPAP.  Occasional wheeze last admit.  Given breathing Rx   Using inhaler occasionally since home. Gyn.  Patient being evaluated by GYN for DUB. Abdominal: negative for nausea, vomiting, diarrhea, bright red blood per rectum, melena, or hematemesis Neurologic: negative for visual changes, syncope, or dizziness All other systems reviewed and are otherwise negative except as noted above.  Physical Exam: Filed Vitals:   03/27/12 1600  BP: 102/64  Pulse: 94  Temp:   Resp: 22   No intake or output data in the 24 hours ending 03/27/12 1654  General: Well developed, well nourished, in no acute distress. Head: Normocephalic, atraumatic, sclera non-icteric Neck: Negative for carotid bruits. JVP not elevated. Lungs: Mild wheeze.  No rales. Heart: RRR with S1 S2. No murmurs, rubs, or gallops appreciated. Abdomen: Soft, , non-distended with normoactive bowel sounds.  Tender on palpitation RUQ. INitally intense then less. No hepatomegaly. Slight guarding.  . No obvious abdominal masses. Msk:  Strength and tone appears normal for age. Extremities: No clubbing, cyanosis  Trace Edema.  2+ edema. Neuro: Alert and oriented X 3. Moves all extremities spontaneously. Psych:  Responds to questions appropriately with a normal affect.  Labs: Results for orders placed during the hospital encounter of 03/27/12 (from the past 24 hour(s))  URINALYSIS, ROUTINE W REFLEX MICROSCOPIC     Status: Normal   Collection Time   03/27/12 12:10 PM      Component Value Range   Color, Urine YELLOW  YELLOW   APPearance CLEAR  CLEAR   Specific Gravity, Urine 1.013  1.005 - 1.030   pH 7.5  5.0 - 8.0   Glucose, UA NEGATIVE  NEGATIVE mg/dL   Hgb urine dipstick NEGATIVE  NEGATIVE   Bilirubin Urine NEGATIVE  NEGATIVE   Ketones, ur NEGATIVE  NEGATIVE mg/dL   Protein, ur NEGATIVE  NEGATIVE mg/dL   Urobilinogen, UA 0.2  0.0 - 1.0 mg/dL   Nitrite NEGATIVE  NEGATIVE   Leukocytes, UA NEGATIVE  NEGATIVE  CBC WITH  DIFFERENTIAL     Status: Abnormal   Collection Time   03/27/12 12:20 PM      Component Value Range   WBC 8.5  4.0 - 10.5 K/uL   RBC 3.95  3.87 - 5.11 MIL/uL   Hemoglobin 11.5 (*) 12.0 - 15.0 g/dL   HCT 35.9 (*) 36.0 - 46.0 %   MCV 90.9  78.0 - 100.0 fL   MCH 29.1  26.0 - 34.0 pg  MCHC 32.0  30.0 - 36.0 g/dL   RDW 13.8  11.5 - 15.5 %   Platelets 218  150 - 400 K/uL   Neutrophils Relative 74  43 - 77 %   Neutro Abs 6.3  1.7 - 7.7 K/uL   Lymphocytes Relative 17  12 - 46 %   Lymphs Abs 1.4  0.7 - 4.0 K/uL   Monocytes Relative 7  3 - 12 %   Monocytes Absolute 0.6  0.1 - 1.0 K/uL   Eosinophils Relative 2  0 - 5 %   Eosinophils Absolute 0.2  0.0 - 0.7 K/uL   Basophils Relative 0  0 - 1 %   Basophils Absolute 0.0  0.0 - 0.1 K/uL  BASIC METABOLIC PANEL     Status: Abnormal   Collection Time   03/27/12 12:20 PM      Component Value Range   Sodium 139  135 - 145 mEq/L   Potassium 3.4 (*) 3.5 - 5.1 mEq/L   Chloride 98  96 - 112 mEq/L   CO2 32  19 - 32 mEq/L   Glucose, Bld 116 (*) 70 - 99 mg/dL   BUN 13  6 - 23 mg/dL   Creatinine, Ser 0.78  0.50 - 1.10 mg/dL   Calcium 9.6  8.4 - 10.5 mg/dL   GFR calc non Af Amer 86 (*) >90 mL/min   GFR calc Af Amer >90  >90 mL/min  POCT I-STAT TROPONIN I     Status: Normal   Collection Time   03/27/12 12:25 PM      Component Value Range   Troponin i, poc 0.00  0.00 - 0.08 ng/mL   Comment 3             Radiology/Studies: Dg Chest 2 View  03/27/2012  *RADIOLOGY REPORT*  Clinical Data: Shortness of breath and chest pain.  CHEST - 2 VIEW  Comparison: 03/08/2012.  Findings: Trachea is midline.  Heart size stable.  Probable mild diffuse interstitial prominence and indistinctness.  No definite pleural fluid.  IMPRESSION: Suspect mild pulmonary edema.  Original Report Authenticated By: Luretha Rued, M.D.    EKG: NSR normal conduction intervals.  ASSESSMENT AND PLAN:  Patient is a 66 year old woman with a history of OSA, HTN.  Recently d/cd from hospital  for syncope.  Had signif pauses.  Metoprolol and provera stopped.  Patient has had 4 "panic" spells but no syncope.  Wearing monitor. Last night develped CP.  Lasted all night.  Gone with NTG then GI cocktail in ER. ON exam she is tender in abdomen.   Otherwise has mild wheeze on exam EKG is normal.  Trop is neg  Impression:  CP  Atypical.  Prolonged with no enzyme rise.  Accomp/followed by abdomenal pain. Plan  Admit.  Tele.  Get records from monitor to see if spells correl with rhythm problem Check LFTs, amylase, lipase.  Check BNP.  Check d dimer (though has bleeding, uterine) Will give lasix x 1 after BNP drawn.    2.  Syncope.  As noted above.  3.  HTN  Reconcile meds     Signed, Dorris Carnes 03/27/2012, 4:54 PM

## 2012-03-27 NOTE — ED Provider Notes (Signed)
History     CSN: [id number]  Arrival date & time 03/27/12  1137   First MD Initiated Contact with Patient 03/27/12 1219      Chief Complaint  Patient presents with  . Chest Pain    (Consider location/radiation/quality/duration/timing/severity/associated sxs/prior treatment) Patient is a 66 y.o. female presenting with chest pain. The history is provided by the patient.  Chest Pain   66 y/o female INAD c/o constant left sided CP starting after midnight pressure/tightness 8/10 radiates to left groin. Pain is positional but non-exertional.  Denies N/V, SOB. Pt was hospitalized x7 days ago for irregular heart beat. Pt is on Holter monitor x21 days (end date July 14). Pt Had MI several years ago, she was unconscious at the time. Cath in 2005, Pharmacologic stress test >5 years ago. RF: 20 Pack years former smoker, High Cholesterol, HTN, CAD with Prior MI.    Past Medical History  Diagnosis Date  . Other and unspecified hyperlipidemia   . Unspecified essential hypertension   . Anemia   . CAD (coronary artery disease)     a. Nonobstructive CAD 2007 (EF 75%.  RCA  proximal 75% tubular, 25% prox LAD, 25% d1, 50% D2) at [facility]. b. NSTEMI 01/2010 in setting of respiratory failure, medical approach (not a good candidate for noninvasive eval)  . OSA on CPAP   . Hypothyroidism   . Peripheral edema 06/13/2011  . Asthma 06/13/2011  . Depression 06/13/2011  . Neuropathy 06/13/2011  . Cervical radiculopathy 06/13/2011  . Gout 06/13/2011  . Obesity hypoventilation syndrome 06/13/2011  . COPD (chronic obstructive pulmonary disease)   . Breast cancer dx'd 2005    left  . Cellulitis     hx of cellulitis in left leg    Past Surgical History  Procedure Date  . Ovarian cyst removal   . Breast lumpectomy   . Cervical disc surgery     have cadaver bones,, screws, and plates   . Cervical biopsy June 2013    at [facility] for Heavy  Bleeding    Family History  Problem Relation Age of Onset  . Breast cancer     . Uterine cancer Mother     History  Substance Use Topics  . Smoking status: Former Smoker -- 1.0 packs/day for 20 years    Types: Cigarettes    Quit date: 09/20/1998  . Smokeless tobacco: Never Used   Comment: 1ppd x 20 years  . Alcohol Use: Yes     rare    OB History    Grav Para Term Preterm Abortions TAB SAB Ect Mult Living                  Review of Systems  Cardiovascular: Positive for chest pain.    Allergies  Codeine and Lisinopril  Home Medications   Current Outpatient Rx  Name Route Sig Dispense Refill  . ALBUTEROL SULFATE HFA 108 (90 BASE) MCG/ACT IN AERS Inhalation Inhale 2 puffs into the lungs every 6 (six) hours as needed.      . ALLOPURINOL 300 MG PO TABS Oral Take 300 mg by mouth daily.    [name] AMITRIPTYLINE HCL 100 MG PO TABS Oral Take 1 tablet (100 mg total) by mouth at bedtime. 90 tablet 1  . AMLODIPINE BESYLATE 5 MG PO TABS Oral Take 1 tablet (5 mg total) by mouth daily. 30 tablet 1  . ASPIRIN 81 MG PO TABS Oral Take 81 mg by mouth daily.      [name]  VITAMIN D3 2000 UNITS PO TABS Oral Take 1 tablet by mouth daily.      . DULOXETINE HCL 30 MG PO CPEP Oral Take 30 mg by mouth daily. Take 2 at bedtime    . OMEGA-3 FATTY ACIDS 1000 MG PO CAPS Oral Take 2 g by mouth daily.      . FUROSEMIDE 40 MG PO TABS Oral Take 40 mg by mouth 2 (two) times daily.    [name] LEVOTHYROXINE SODIUM 200 MCG PO TABS Oral Take 200 mcg by mouth 2 (two) times daily.    [name] POTASSIUM CHLORIDE 10 MEQ PO TBCR Oral Take 10 mEq by mouth daily.    [name] PRAVASTATIN SODIUM 40 MG PO TABS Oral Take 40 mg by mouth daily.    [name] VITAMIN C 500 MG PO TABS Oral Take 500 mg by mouth daily.        BP 114/90  Pulse 96  Temp 98.2 F (36.8 C) (Oral)  Resp 20  SpO2 97%  Physical Exam  ED Course  Procedures (including critical care time)  Labs Reviewed  CBC WITH DIFFERENTIAL - Abnormal; Notable for the following:    Hemoglobin 11.5 (*)     HCT 35.9 (*)     All other components within normal limits    BASIC METABOLIC PANEL - Abnormal; Notable for the following:    Potassium 3.4 (*)     Glucose, Bld 116 (*)     GFR calc non Af Amer 86 (*)     All other components within normal limits  URINALYSIS, ROUTINE W REFLEX MICROSCOPIC  POCT I-STAT TROPONIN I   Dg Chest 2 View  03/27/2012  *RADIOLOGY REPORT*  Clinical Data: Shortness of breath and chest pain.  CHEST - 2 VIEW  Comparison: 03/08/2012.  Findings: Trachea is midline.  Heart size stable.  Probable mild diffuse interstitial prominence and indistinctness.  No definite pleural fluid.  IMPRESSION: Suspect mild pulmonary edema.  Original Report Authenticated By: Luretha Rued, M.D.     1. Chest pain      Date: 03/27/2012  Rate: 97  Rhythm: normal sinus rhythm  QRS Axis: normal  Intervals: normal  ST/T Wave abnormalities: normal and nonspecific ST changes  Conduction Disutrbances:none  Narrative Interpretation:   Old EKG Reviewed: unchanged     MDM  66 y/o obese female with HTN, CAD, Hyperlipidemia, former smoker,  prior MI, c/o >10 hours of left sided chest pressure tightness.   ECK normal with first troponin negative.   1400 Pt BP dropped to 70s over 40s. Pt remained alert reports feeling warm. 250 ml NS bolus returned BP to 90s over 60s Infusion slowed to 125cc/hour.   Cardiology consult pending   15:15 revevaluation of patient shows resolution of pain,.  Pt will be admitted top telemetry based on recommendations from [facility].     Monico Blitz, PA-C 03/27/12 2012

## 2012-03-27 NOTE — ED Notes (Signed)
BP increased after 150cc of NS. Verbal order from [place] PA to slow fluid rate to 125cc/hr. Pt Reports improvement of hot and dizzy feeling and decrease in chest discomfort. Pt remains on cardiac monitor. BP cycling q7mn.

## 2012-03-28 ENCOUNTER — Inpatient Hospital Stay (HOSPITAL_COMMUNITY): Payer: Medicare Other

## 2012-03-28 DIAGNOSIS — D259 Leiomyoma of uterus, unspecified: Secondary | ICD-10-CM | POA: Diagnosis not present

## 2012-03-28 DIAGNOSIS — C50919 Malignant neoplasm of unspecified site of unspecified female breast: Secondary | ICD-10-CM | POA: Diagnosis not present

## 2012-03-28 DIAGNOSIS — R079 Chest pain, unspecified: Secondary | ICD-10-CM | POA: Diagnosis not present

## 2012-03-28 DIAGNOSIS — K3580 Unspecified acute appendicitis: Secondary | ICD-10-CM

## 2012-03-28 LAB — CARDIAC PANEL(CRET KIN+CKTOT+MB+TROPI)
CK, MB: 1.8 ng/mL (ref 0.3–4.0)
CK, MB: 2.7 ng/mL (ref 0.3–4.0)
Total CK: 163 U/L (ref 7–177)
Total CK: 202 U/L — ABNORMAL HIGH (ref 7–177)
Troponin I: <0.3 ng/mL (ref <0.30)

## 2012-03-28 LAB — BASIC METABOLIC PANEL
BUN: 11 mg/dL (ref 6–23)
CO2: 31 mEq/L (ref 19–32)
GFR calc non Af Amer: 69 mL/min — ABNORMAL LOW (ref >90)
Glucose, Bld: 117 mg/dL — ABNORMAL HIGH (ref 70–99)
Potassium: 3.4 mEq/L — ABNORMAL LOW (ref 3.5–5.1)

## 2012-03-28 MED ORDER — IOHEXOL 300 MG/ML  SOLN
100.0000 mL | Freq: Once | INTRAMUSCULAR | Status: AC | PRN
  Administered 2012-03-28: 100 mL via INTRAVENOUS

## 2012-03-28 MED ORDER — METRONIDAZOLE IN NACL 5-0.79 MG/ML-% IV SOLN
500.0000 mg | Freq: Three times a day (TID) | INTRAVENOUS | Status: DC
  Administered 2012-03-28 – 2012-03-30 (×6): 500 mg via INTRAVENOUS
  Filled 2012-03-28 (×9): qty 100

## 2012-03-28 MED ORDER — POTASSIUM CHLORIDE IN NACL 40-0.9 MEQ/L-% IV SOLN
INTRAVENOUS | Status: DC
  Administered 2012-03-28 – 2012-03-30 (×3): via INTRAVENOUS
  Filled 2012-03-28 (×5): qty 1000

## 2012-03-28 MED ORDER — DEXTROSE 5 % IV SOLN
2.0000 g | INTRAVENOUS | Status: DC
  Administered 2012-03-28 – 2012-03-29 (×2): 2 g via INTRAVENOUS
  Filled 2012-03-28 (×3): qty 2

## 2012-03-28 NOTE — Progress Notes (Signed)
    Subjective:  No chest pain or dyspnea. Fever noted last pm, resolved with Tylenol. No abdominal pain this am. Overall feels better.  Objective:  Vital Signs in the last 24 hours: Temp:  [98.2 F (36.8 C)-101 F (38.3 C)] 98.4 F (36.9 C) (07/09 0556) Pulse Rate:  [91-114] 114  (07/09 0556) Resp:  [12-24] 22  (07/09 0556) BP: (75-179)/(40-90) 113/71 mmHg (07/09 0700) SpO2:  [92 %-99 %] 92 % (07/09 0556) Weight:  [155.856 kg (343 lb 9.6 oz)-157 kg (346 lb 2 oz)] 157 kg (346 lb 2 oz) (07/09 0556)  Intake/Output from previous day: 07/08 0701 - 07/09 0700 In: -  Out: 425 [Urine:425]  Physical Exam: Pt is alert and oriented, pleasant obese woman in NAD HEENT: normal Neck: JVP - normal, carotids 2+= without bruits Lungs: CTA bilaterally CV: RRR without murmur or gallop Abd: soft, obese, +BS, mild diffuse tenderness worse over epigastrium, no rebound or guarding Ext: trace bilateral pretib edema Skin: warm/dry no rash   Lab Results:  Basename 03/27/12 1756 03/27/12 1220  WBC 10.1 8.5  HGB 10.6* 11.5*  PLT 200 218    Basename 03/28/12 0615 03/27/12 1220  NA 138 139  K 3.4* 3.4*  CL 98 98  CO2 31 32  GLUCOSE 117* 116*  BUN 11 13  CREATININE 0.86 0.78    Basename 03/28/12 0615 03/27/12 2346  TROPONINI <0.30 <0.30   Tele: Sinus tachycardia, no significant arrhythmia  Assessment/Plan:  1. Abdominal pain/fever. Mild elevation of LFT's noted and mild leukocytosis. Recommend CT of the abdomen to exclude intra-abdominal process.   2. Chest pain - resolved. Highly atypical and sounds more related to abdominal process. Enzymes negative, BNP normal. No further workup at this time.  3. Syncope - pt wearing event monitor. No events reported.  4. Dispo - Abdominal CT today. Observe for fever another 24 hours. If recurrent fever needs blood cultures drawn.  [name], M.D. 03/28/2012, 7:30 AM

## 2012-03-28 NOTE — Progress Notes (Signed)
I was called by the nurse taking care of [name] [name]. She has returned from her CAT scan and the findings are consistent with acute appendicitis without abscess formation. I have called a Gen. surgery consultation. I think this fits with this patient's clinical picture is she has had abdominal discomfort and fever. If she requires surgery, she can proceed without further cardiac testing as she has had negative cardiac enzymes and no acceleration of her angina.  [name] 03/28/2012 1:34 PM

## 2012-03-28 NOTE — Consult Note (Signed)
Reason for Consult: Appendicitis on CT Referring Physician: Ambry Dix is an 66 y.o. female.  HPI: Patient is a 66 year old African American female with multiple medical problems as noted below. She presented last night to the ER with what she thought was chest pain. She describes as being mid chest, epigastric area, and then she describes some diffuse discomfort in her abdomen. It would extend from the left upper quadrant all the way around to the right lower quadrant. She also has chronic back discomfort. She was readmitted by the hospitalist and seen by Dr. Dorris Carnes in consultation for chest pain. She had a recent hospitalization for syncope with a finding of significant pauses. metoprolol  was discontinued as was Provera (which was started for menstrual bleeding.) She also has a history of panic attacks, she is on a 28 day monitor for her rhythm. She was admitted to rule out recurrent coronary disease. CK and troponins were negative. WBC was normal on admission. Today she was seen by Dr. Burt Knack and complained of more abdominal discomfort than chest pain.  A CT scan was obtained, and this is positive for acute appendicitis with no evidence of abscess or other complication. She additionally has a large uterine fibroid measuring 7 cm. We were asked to see in consultation. Dr. Burt Knack is noted she is cleared for surgery that as needed. No further cardiac evaluation is required at this time.  Past Medical History  Diagnosis Date  . Other and unspecified hyperlipidemia   . Unspecified essential hypertension   . Anemia   . CAD (coronary artery disease)     a. Nonobstructive CAD 2007 (EF 75%.  RCA  proximal 75% tubular, 25% prox LAD, 25% d1, 50% D2) at [facility]. b. NSTEMI 01/2010 in setting of respiratory failure, medical approach (not a good candidate for noninvasive eval)  . OSA on CPAP   . Hypothyroidism   . Peripheral edema improved on increased lasix dose. 06/13/2011  . Asthma, better now.    06/13/2011  . Depression 06/13/2011  . Neuropathy, upper extremities, ongoing since C4-C5 surgery 06/13/2011  . Cervical radiculopathy 06/13/2011  . Gout 06/13/2011  . Obesity hypoventilation syndrome on daily O2 06/13/2011  . COPD (chronic obstructive pulmonary disease) On daily O2   . Breast cancer dx'd 2005    left  . Cellulitis     hx of cellulitis in left leg better with increased Lasix use.    Past Surgical History  Procedure Date  . Ovarian cyst removal [facility] [phone]   . Breast lumpectomy, Left  2005  . Cervical disc surgery     have cadaver bones,, screws, and plates   . Cervical biopsy June 2013    at [facility] for Heavy  Bleeding  . Dilation and curettage of uterus   . Colonoscopy 5 yrs ago she thinks    Family History  Problem Relation Age of Onset  . Breast cancer    . Uterine cancer Mother     Social History:  reports that she quit smoking about 13 years ago. Her smoking use included Cigarettes. She has a 20 pack-year smoking history. She has never used smokeless tobacco. She reports that she drinks alcohol. She reports that she does not use illicit drugs.  Allergies:  Allergies  Allergen Reactions  . Codeine     REACTION: unknown  . Lisinopril     REACTION: cough    Medications:  Prior to Admission:  Prescriptions prior to admission  Medication Sig Dispense Refill  .  albuterol (PROAIR HFA) 108 (90 BASE) MCG/ACT inhaler Inhale 2 puffs into the lungs every 6 (six) hours as needed.        [name] allopurinol (ZYLOPRIM) 300 MG tablet Take 300 mg by mouth daily.      [name] amitriptyline (ELAVIL) 100 MG tablet Take 1 tablet (100 mg total) by mouth at bedtime.  90 tablet  1  . amLODipine (NORVASC) 5 MG tablet Take 1 tablet (5 mg total) by mouth daily.  30 tablet  1  . aspirin 81 MG tablet Take 81 mg by mouth daily.        . Cholecalciferol (VITAMIN D3) 2000 UNITS TABS Take 1 tablet by mouth daily.        . DULoxetine (CYMBALTA) 30 MG capsule Take 30 mg by mouth daily. Take  2 at bedtime      . fish oil-omega-3 fatty acids 1000 MG capsule Take 2 g by mouth daily.        . furosemide (LASIX) 40 MG tablet Take 40 mg by mouth 2 (two) times daily.      [name] levothyroxine (SYNTHROID, LEVOTHROID) 200 MCG tablet Take 200 mcg by mouth 2 (two) times daily.      . potassium chloride (KLOR-CON) 10 MEQ CR tablet Take 10 mEq by mouth daily.      . pravastatin (PRAVACHOL) 40 MG tablet Take 40 mg by mouth daily.      . vitamin C (ASCORBIC ACID) 500 MG tablet Take 500 mg by mouth daily.         Scheduled:   . allopurinol  300 mg Oral Daily  . amitriptyline  100 mg Oral QHS  . amLODipine  5 mg Oral Daily  . aspirin  324 mg Oral NOW   Or  . aspirin  300 mg Rectal NOW  . aspirin EC  81 mg Oral Daily  . cholecalciferol  2,000 Units Oral Daily  . DULoxetine  30 mg Oral Daily  . furosemide  40 mg Intravenous Once  . furosemide  40 mg Oral BID  . heparin  5,000 Units Subcutaneous Q8H  . levothyroxine  200 mcg Oral BID  . omega-3 acid ethyl esters  2 g Oral Daily  . potassium chloride  10 mEq Oral q morning - 10a  . potassium chloride  30 mEq Oral Once  . simvastatin  20 mg Oral q1800  . sodium chloride  3 mL Intravenous Q12H  . vitamin C  500 mg Oral Daily  . DISCONTD: aspirin  81 mg Oral Daily  . DISCONTD: fish oil-omega-3 fatty acids  2 g Oral Daily  . DISCONTD: furosemide  40 mg Oral Daily  . DISCONTD: Vitamin D3  1 tablet Oral Daily   Continuous:  QBH:ALPFXT chloride, acetaminophen, albuterol, alum & mag hydroxide-simeth, iohexol, nitroGLYCERIN, ondansetron (ZOFRAN) IV, sodium chloride, DISCONTD: nitroGLYCERIN Anti-infectives    None      Results for orders placed during the hospital encounter of 03/27/12 (from the past 48 hour(s))  URINALYSIS, ROUTINE W REFLEX MICROSCOPIC     Status: Normal   Collection Time   03/27/12 12:10 PM      Component Value Range Comment   Color, Urine YELLOW  YELLOW    APPearance CLEAR  CLEAR    Specific Gravity, Urine 1.013  1.005 -  1.030    pH 7.5  5.0 - 8.0    Glucose, UA NEGATIVE  NEGATIVE mg/dL    Hgb urine dipstick NEGATIVE  NEGATIVE    Bilirubin  Urine NEGATIVE  NEGATIVE    Ketones, ur NEGATIVE  NEGATIVE mg/dL    Protein, ur NEGATIVE  NEGATIVE mg/dL    Urobilinogen, UA 0.2  0.0 - 1.0 mg/dL    Nitrite NEGATIVE  NEGATIVE    Leukocytes, UA NEGATIVE  NEGATIVE MICROSCOPIC NOT DONE ON URINES WITH NEGATIVE PROTEIN, BLOOD, LEUKOCYTES, NITRITE, OR GLUCOSE <1000 mg/dL.  CBC WITH DIFFERENTIAL     Status: Abnormal   Collection Time   03/27/12 12:20 PM      Component Value Range Comment   WBC 8.5  4.0 - 10.5 K/uL    RBC 3.95  3.87 - 5.11 MIL/uL    Hemoglobin 11.5 (*) 12.0 - 15.0 g/dL    HCT 35.9 (*) 36.0 - 46.0 %    MCV 90.9  78.0 - 100.0 fL    MCH 29.1  26.0 - 34.0 pg    MCHC 32.0  30.0 - 36.0 g/dL    RDW 13.8  11.5 - 15.5 %    Platelets 218  150 - 400 K/uL    Neutrophils Relative 74  43 - 77 %    Neutro Abs 6.3  1.7 - 7.7 K/uL    Lymphocytes Relative 17  12 - 46 %    Lymphs Abs 1.4  0.7 - 4.0 K/uL    Monocytes Relative 7  3 - 12 %    Monocytes Absolute 0.6  0.1 - 1.0 K/uL    Eosinophils Relative 2  0 - 5 %    Eosinophils Absolute 0.2  0.0 - 0.7 K/uL    Basophils Relative 0  0 - 1 %    Basophils Absolute 0.0  0.0 - 0.1 K/uL   BASIC METABOLIC PANEL     Status: Abnormal   Collection Time   03/27/12 12:20 PM      Component Value Range Comment   Sodium 139  135 - 145 mEq/L    Potassium 3.4 (*) 3.5 - 5.1 mEq/L    Chloride 98  96 - 112 mEq/L    CO2 32  19 - 32 mEq/L    Glucose, Bld 116 (*) 70 - 99 mg/dL    BUN 13  6 - 23 mg/dL    Creatinine, Ser 0.78  0.50 - 1.10 mg/dL    Calcium 9.6  8.4 - 10.5 mg/dL    GFR calc non Af Amer 86 (*) >90 mL/min    GFR calc Af Amer >90  >90 mL/min   POCT I-STAT TROPONIN I     Status: Normal   Collection Time   03/27/12 12:25 PM      Component Value Range Comment   Troponin i, poc 0.00  0.00 - 0.08 ng/mL    Comment 3            POCT I-STAT TROPONIN I     Status: Normal    Collection Time   03/27/12  4:45 PM      Component Value Range Comment   Troponin i, poc 0.00  0.00 - 0.08 ng/mL    Comment 3            TSH     Status: Abnormal   Collection Time   03/27/12  5:56 PM      Component Value Range Comment   TSH 0.057 (*) 0.350 - 4.500 uIU/mL   CBC     Status: Abnormal   Collection Time   03/27/12  5:56 PM      Component Value Range Comment  WBC 10.1  4.0 - 10.5 K/uL    RBC 3.67 (*) 3.87 - 5.11 MIL/uL    Hemoglobin 10.6 (*) 12.0 - 15.0 g/dL    HCT 33.0 (*) 36.0 - 46.0 %    MCV 89.9  78.0 - 100.0 fL    MCH 28.9  26.0 - 34.0 pg    MCHC 32.1  30.0 - 36.0 g/dL    RDW 14.1  11.5 - 15.5 %    Platelets 200  150 - 400 K/uL   CARDIAC PANEL(CRET KIN+CKTOT+MB+TROPI)     Status: Normal   Collection Time   03/27/12  6:46 PM      Component Value Range Comment   Total CK 136  7 - 177 U/L    CK, MB 2.1  0.3 - 4.0 ng/mL    Troponin I <0.30  <0.30 ng/mL    Relative Index 1.5  0.0 - 2.5   AMYLASE     Status: Normal   Collection Time   03/27/12  6:46 PM      Component Value Range Comment   Amylase 72  0 - 105 U/L   HEPATIC FUNCTION PANEL     Status: Abnormal   Collection Time   03/27/12  6:46 PM      Component Value Range Comment   Total Protein 7.0  6.0 - 8.3 g/dL    Albumin 3.4 (*) 3.5 - 5.2 g/dL    AST 60 (*) 0 - 37 U/L    ALT 67 (*) 0 - 35 U/L    Alkaline Phosphatase 89  39 - 117 U/L    Total Bilirubin 0.6  0.3 - 1.2 mg/dL    Bilirubin, Direct 0.1  0.0 - 0.3 mg/dL    Indirect Bilirubin 0.5  0.3 - 0.9 mg/dL   D-DIMER, QUANTITATIVE     Status: Abnormal   Collection Time   03/27/12  6:46 PM      Component Value Range Comment   D-Dimer, Quant 0.58 (*) 0.00 - 0.48 ug/mL-FEU   LIPASE, BLOOD     Status: Normal   Collection Time   03/27/12  6:46 PM      Component Value Range Comment   Lipase 17  11 - 59 U/L   PRO B NATRIURETIC PEPTIDE     Status: Normal   Collection Time   03/27/12  6:46 PM      Component Value Range Comment   Pro B Natriuretic peptide (BNP) 54.9  0  - 125 pg/mL   CARDIAC PANEL(CRET KIN+CKTOT+MB+TROPI)     Status: Normal   Collection Time   03/27/12 11:46 PM      Component Value Range Comment   Total CK 163  7 - 177 U/L    CK, MB 1.8  0.3 - 4.0 ng/mL    Troponin I <0.30  <0.30 ng/mL    Relative Index 1.1  0.0 - 2.5   CARDIAC PANEL(CRET KIN+CKTOT+MB+TROPI)     Status: Abnormal   Collection Time   03/28/12  6:15 AM      Component Value Range Comment   Total CK 202 (*) 7 - 177 U/L    CK, MB 2.7  0.3 - 4.0 ng/mL    Troponin I <0.30  <0.30 ng/mL    Relative Index 1.3  0.0 - 2.5   BASIC METABOLIC PANEL     Status: Abnormal   Collection Time   03/28/12  6:15 AM      Component Value Range Comment  Sodium 138  135 - 145 mEq/L    Potassium 3.4 (*) 3.5 - 5.1 mEq/L    Chloride 98  96 - 112 mEq/L    CO2 31  19 - 32 mEq/L    Glucose, Bld 117 (*) 70 - 99 mg/dL    BUN 11  6 - 23 mg/dL    Creatinine, Ser 0.86  0.50 - 1.10 mg/dL    Calcium 9.2  8.4 - 10.5 mg/dL    GFR calc non Af Amer 69 (*) >90 mL/min    GFR calc Af Amer 80 (*) >90 mL/min     Dg Chest 2 View  03/27/2012  *RADIOLOGY REPORT*  Clinical Data: Shortness of breath and chest pain.  CHEST - 2 VIEW  Comparison: 03/08/2012.  Findings: Trachea is midline.  Heart size stable.  Probable mild diffuse interstitial prominence and indistinctness.  No definite pleural fluid.  IMPRESSION: Suspect mild pulmonary edema.  Original Report Authenticated By: Luretha Rued, M.D.   Ct Abdomen Pelvis W Contrast  03/28/2012  *RADIOLOGY REPORT*  Clinical Data: Diffuse abdominal pain.  Fever.  Elevated liver function tests.  Breast carcinoma.  CT ABDOMEN AND PELVIS WITH CONTRAST  Technique:  Multidetector CT imaging of the abdomen and pelvis was performed following the standard protocol during bolus administration of intravenous contrast.  Contrast: 128m OMNIPAQUE IOHEXOL 300 MG/ML  SOLN  Comparison: None.  Findings: The liver, gallbladder, spleen, pancreas, adrenal glands, and right kidney are normal  appearance.  A small left renal cyst is seen, however there is no evidence of renal mass or hydronephrosis. No other soft tissue masses or lymphadenopathy identified within the abdomen.  A left uterine fibroid is seen which measures approximately 7 cm. Shotty bilateral inguinal lymph nodes are seen, but no definite pathologically enlarged nodes are identified within the pelvis.  The appendix and base of the cecum show wall thickening, and there is moderate periappendiceal inflammatory change, consistent with acute appendicitis.  There is no evidence of abscess or free fluid. No evidence of bowel obstruction.  IMPRESSION:  1.  Positive for acute appendicitis.  No evidence of abscess or other complication. 2.  Large uterine fibroid measuring approximately 7 cm.  These results will be called to the ordering clinician or representative by the Radiologist Assistant, and communication documented in the PACS Dashboard.  Original Report Authenticated By: JMarlaine Hind M.D.    Review of Systems  Constitutional: Positive for fever (last PM at 5:50 PM up to 101). Negative for chills, weight loss, malaise/fatigue and diaphoresis.  HENT: Negative.   Eyes: Negative.   Respiratory: Positive for shortness of breath (DOE, says she doesn't even try to walk far now due to SOB.). Negative for cough, hemoptysis, sputum production and wheezing.   Cardiovascular: Positive for chest pain, palpitations (Has episodes of bradycardia), orthopnea (She has sleep apnea and uses CPAP to sleep O2 during the day to keep sats up.), leg swelling and PND. Negative for claudication.  Gastrointestinal: Positive for heartburn (occasional) and abdominal pain (she was having pain chest, mid sternal, mid epigastric and then all over abdomen.). Negative for nausea, vomiting, diarrhea, constipation, blood in stool and melena.  Genitourinary: Negative for dysuria, urgency, frequency, hematuria and flank pain.       Note urine was dark yesterday.    Musculoskeletal: Positive for myalgias, back pain, joint pain and falls (hospitalized for syncope 03/08/12.).  Skin: Negative.   Neurological: Positive for dizziness and loss of consciousness (March 08, 2012). Negative for  tingling, tremors, sensory change, speech change, focal weakness, seizures and weakness.  Endo/Heme/Allergies: Negative.        On Asprin   Psychiatric/Behavioral: Positive for depression (denies now on meds for neuropathy). Negative for suicidal ideas, hallucinations, memory loss and substance abuse. The patient is not nervous/anxious and does not have insomnia.    Blood pressure 99/65, pulse 94, temperature 97.6 F (36.4 C), temperature source Oral, resp. rate 18, height 5' 5"  (1.651 m), weight 157 kg (346 lb 2 oz), SpO2 100.00%. Physical Exam  Constitutional: She is oriented to person, place, and time.       BMI 57, sitting up no acute distress.  HENT:  Head: Normocephalic and atraumatic.  Eyes: Conjunctivae and EOM are normal. Pupils are equal, round, and reactive to light. Right eye exhibits no discharge. Left eye exhibits no discharge. No scleral icterus.  Neck: Normal range of motion. Neck supple. No JVD present. No tracheal deviation present. No thyromegaly present.  Cardiovascular: Regular rhythm and intact distal pulses.  Exam reveals no gallop and no friction rub.   Murmur heard.      Tachycardic  Respiratory: Effort normal. No respiratory distress. She has no wheezes. She has no rales. She exhibits no tenderness.       Pt is on chronic O2.  GI: Soft. Bowel sounds are normal. She exhibits no distension and no mass. There is tenderness (diffuse tenderness on admit, now says it's more on right side.). There is no rebound and no guarding.  Genitourinary:       Menstral bleeding stopped last week  Musculoskeletal: She exhibits edema (trace). She exhibits no tenderness.  Lymphadenopathy:    She has no cervical adenopathy.  Neurological: She is alert and oriented to  person, place, and time. She has normal reflexes. No cranial nerve deficit.  Skin: Skin is warm and dry. No rash noted. No erythema.  Psychiatric: She has a normal mood and affect. Her behavior is normal. Judgment and thought content normal.    Assessment/Plan: 1. Acute appendicitis by CT scan. 2. Recent hospitalization for syncope; and a half of bradycardia with pauses on telemetry. Currently undergoing 28 day Holter studies. 3. Noncritical coronary artery disease with an EF of 75%. 4. Obstructive sleep apnea with CPAP. 5. Obesity hypoventilation syndrome on oxygen. 6. COPD with a 20 year history of tobacco use. 7. Hypertension 8. Hypothyroid 9. BMI 57 10. History of depression 11. History of exploratory laparotomy for ovarian disease. CT evidence of a 7 cm uterine fibroid currently.  Plan: Patient had lunch at noon and is drinking at the time we were called. I will start her on some antibiotics, ice chips and clear liquids tonight, n.p.o. after midnight. Dr. Johney Maine we'll discuss possible appendectomy tomorrow.  Gabrella Stroh 03/28/2012, 2:56 PM

## 2012-03-28 NOTE — Consult Note (Signed)
Will need appy, but just ate.  Will try in AM

## 2012-03-28 NOTE — ED Provider Notes (Signed)
Medical screening examination/treatment/procedure(s) were performed by non-physician practitioner and as supervising physician I was immediately available for consultation/collaboration.  Virgel Manifold, MD 03/28/12 0900

## 2012-03-29 ENCOUNTER — Encounter (HOSPITAL_COMMUNITY)
Admission: EM | Disposition: A | Discharge status: Home / Self Care | Payer: Self-pay | Source: Home / Self Care | Attending: Internal Medicine

## 2012-03-29 ENCOUNTER — Encounter (HOSPITAL_COMMUNITY): Payer: Self-pay | Admitting: Anesthesiology

## 2012-03-29 ENCOUNTER — Inpatient Hospital Stay (HOSPITAL_COMMUNITY): Payer: Medicare Other | Admitting: Anesthesiology

## 2012-03-29 DIAGNOSIS — E662 Morbid (severe) obesity with alveolar hypoventilation: Secondary | ICD-10-CM | POA: Diagnosis not present

## 2012-03-29 DIAGNOSIS — K3580 Unspecified acute appendicitis: Secondary | ICD-10-CM

## 2012-03-29 DIAGNOSIS — R079 Chest pain, unspecified: Secondary | ICD-10-CM | POA: Diagnosis not present

## 2012-03-29 DIAGNOSIS — Z6841 Body Mass Index (BMI) 40.0 and over, adult: Secondary | ICD-10-CM | POA: Diagnosis not present

## 2012-03-29 DIAGNOSIS — K3533 Acute appendicitis with perforation and localized peritonitis, with abscess: Secondary | ICD-10-CM | POA: Diagnosis not present

## 2012-03-29 HISTORY — PX: LAPAROSCOPIC APPENDECTOMY: SHX408

## 2012-03-29 LAB — CBC
MCV: 90.8 fL (ref 78.0–100.0)
Platelets: 176 10*3/uL (ref 150–400)
RBC: 3.59 MIL/uL — ABNORMAL LOW (ref 3.87–5.11)
RDW: 14 % (ref 11.5–15.5)
WBC: 7 10*3/uL (ref 4.0–10.5)

## 2012-03-29 LAB — COMPREHENSIVE METABOLIC PANEL
ALT: 40 U/L — ABNORMAL HIGH (ref 0–35)
AST: 27 U/L (ref 0–37)
Albumin: 3.1 g/dL — ABNORMAL LOW (ref 3.5–5.2)
Alkaline Phosphatase: 77 U/L (ref 39–117)
CO2: 31 mEq/L (ref 19–32)
Chloride: 98 mEq/L (ref 96–112)
Creatinine, Ser: 0.71 mg/dL (ref 0.50–1.10)
GFR calc non Af Amer: 89 mL/min — ABNORMAL LOW (ref >90)
Potassium: 3.1 mEq/L — ABNORMAL LOW (ref 3.5–5.1)
Total Bilirubin: 0.5 mg/dL (ref 0.3–1.2)

## 2012-03-29 SURGERY — APPENDECTOMY, LAPAROSCOPIC
Anesthesia: General | Site: Abdomen | Wound class: Dirty or Infected

## 2012-03-29 MED ORDER — ONDANSETRON HCL 4 MG/2ML IJ SOLN
INTRAMUSCULAR | Status: DC | PRN
  Administered 2012-03-29: 4 mg via INTRAVENOUS

## 2012-03-29 MED ORDER — CISATRACURIUM BESYLATE (PF) 10 MG/5ML IV SOLN
INTRAVENOUS | Status: DC | PRN
  Administered 2012-03-29: 1 mg via INTRAVENOUS
  Administered 2012-03-29: 2 mg via INTRAVENOUS
  Administered 2012-03-29: 6 mg via INTRAVENOUS
  Administered 2012-03-29: 2 mg via INTRAVENOUS
  Administered 2012-03-29: 1 mg via INTRAVENOUS

## 2012-03-29 MED ORDER — SUFENTANIL CITRATE 50 MCG/ML IV SOLN
INTRAVENOUS | Status: DC | PRN
  Administered 2012-03-29 (×3): 10 ug via INTRAVENOUS

## 2012-03-29 MED ORDER — NEOSTIGMINE METHYLSULFATE 1 MG/ML IJ SOLN
INTRAMUSCULAR | Status: DC | PRN
  Administered 2012-03-29: 4 mg via INTRAVENOUS

## 2012-03-29 MED ORDER — KETOROLAC TROMETHAMINE 30 MG/ML IJ SOLN: 30.0000 mg | Freq: Once | INTRAMUSCULAR | Status: DC

## 2012-03-29 MED ORDER — LACTATED RINGERS IV SOLN
INTRAVENOUS | Status: DC
  Administered 2012-03-29: 1000 mL via INTRAVENOUS
  Administered 2012-03-29: 13:00:00 via INTRAVENOUS

## 2012-03-29 MED ORDER — PROPOFOL 10 MG/ML IV EMUL
INTRAVENOUS | Status: DC | PRN
  Administered 2012-03-29: 200 mg via INTRAVENOUS
  Administered 2012-03-29: 50 mg via INTRAVENOUS

## 2012-03-29 MED ORDER — FENTANYL CITRATE 0.05 MG/ML IJ SOLN: 25.0000 ug | INTRAMUSCULAR | Status: DC | PRN

## 2012-03-29 MED ORDER — SUCCINYLCHOLINE CHLORIDE 20 MG/ML IJ SOLN
INTRAMUSCULAR | Status: DC | PRN
  Administered 2012-03-29: 100 mg via INTRAVENOUS

## 2012-03-29 MED ORDER — OXYCODONE HCL 5 MG PO TABS: 5.0000 mg | ORAL_TABLET | ORAL | Status: DC | PRN

## 2012-03-29 MED ORDER — ALBUTEROL SULFATE HFA 108 (90 BASE) MCG/ACT IN AERS
INHALATION_SPRAY | RESPIRATORY_TRACT | Status: DC | PRN
  Administered 2012-03-29: 7 via RESPIRATORY_TRACT

## 2012-03-29 MED ORDER — NAPROXEN 500 MG PO TABS
500.0000 mg | ORAL_TABLET | Freq: Two times a day (BID) | ORAL | Status: DC
  Administered 2012-03-29: 500 mg via ORAL
  Filled 2012-03-29 (×4): qty 1

## 2012-03-29 MED ORDER — LIDOCAINE HCL (CARDIAC) 20 MG/ML IV SOLN
INTRAVENOUS | Status: DC | PRN
  Administered 2012-03-29: 100 mg via INTRAVENOUS

## 2012-03-29 MED ORDER — LACTATED RINGERS IV BOLUS (SEPSIS): 1000.0000 mL | Freq: Three times a day (TID) | INTRAVENOUS | Status: DC | PRN

## 2012-03-29 MED ORDER — DEXAMETHASONE SODIUM PHOSPHATE 4 MG/ML IJ SOLN
INTRAMUSCULAR | Status: DC | PRN
  Administered 2012-03-29: 10 mg via INTRAVENOUS

## 2012-03-29 MED ORDER — POTASSIUM CHLORIDE CRYS ER 20 MEQ PO TBCR
20.0000 meq | EXTENDED_RELEASE_TABLET | Freq: Every morning | ORAL | Status: DC
  Administered 2012-03-30: 20 meq via ORAL
  Filled 2012-03-29 (×2): qty 1

## 2012-03-29 MED ORDER — MUPIROCIN 2 % EX OINT
TOPICAL_OINTMENT | CUTANEOUS | Status: AC
  Administered 2012-03-29: 09:00:00
  Filled 2012-03-29: qty 22

## 2012-03-29 MED ORDER — HEPARIN SODIUM (PORCINE) 5000 UNIT/ML IJ SOLN
5000.0000 [IU] | Freq: Three times a day (TID) | INTRAMUSCULAR | Status: DC
Start: 2012-03-30 — End: 2012-03-30
  Administered 2012-03-30: 5000 [IU] via SUBCUTANEOUS
  Filled 2012-03-29 (×4): qty 1

## 2012-03-29 MED ORDER — POTASSIUM CHLORIDE 10 MEQ/100ML IV SOLN
10.0000 meq | INTRAVENOUS | Status: AC
  Administered 2012-03-29 (×5): 10 meq via INTRAVENOUS
  Filled 2012-03-29 (×4): qty 100

## 2012-03-29 MED ORDER — BUPIVACAINE-EPINEPHRINE 0.25% -1:200000 IJ SOLN
INTRAMUSCULAR | Status: DC | PRN
  Administered 2012-03-29: 50 mL

## 2012-03-29 MED ORDER — PROMETHAZINE HCL 25 MG/ML IJ SOLN: 6.2500 mg | INTRAMUSCULAR | Status: DC | PRN

## 2012-03-29 MED ORDER — GLYCOPYRROLATE 0.2 MG/ML IJ SOLN
INTRAMUSCULAR | Status: DC | PRN
  Administered 2012-03-29: .6 mg via INTRAVENOUS

## 2012-03-29 SURGICAL SUPPLY — 44 items
APPLIER CLIP 5 13 M/L LIGAMAX5 (MISCELLANEOUS)
APPLIER CLIP ROT 10 11.4 M/L (STAPLE)
CANISTER SUCTION 2500CC (MISCELLANEOUS) ×2 IMPLANT
CLIP APPLIE 5 13 M/L LIGAMAX5 (MISCELLANEOUS) IMPLANT
CLIP APPLIE ROT 10 11.4 M/L (STAPLE) IMPLANT
CLOTH BEACON ORANGE TIMEOUT ST (SAFETY) ×2 IMPLANT
CUTTER FLEX LINEAR 45M (STAPLE) IMPLANT
DECANTER SPIKE VIAL GLASS SM (MISCELLANEOUS) ×2 IMPLANT
DEVICE TROCAR PUNCTURE CLOSURE (ENDOMECHANICALS) ×2 IMPLANT
DRAPE LAPAROSCOPIC ABDOMINAL (DRAPES) ×2 IMPLANT
DRSG TEGADERM 2-3/8X2-3/4 SM (GAUZE/BANDAGES/DRESSINGS) ×4 IMPLANT
DRSG TEGADERM 4X4.75 (GAUZE/BANDAGES/DRESSINGS) ×2 IMPLANT
ELECT REM PT RETURN 9FT ADLT (ELECTROSURGICAL) ×2
ELECTRODE REM PT RTRN 9FT ADLT (ELECTROSURGICAL) ×1 IMPLANT
ENDOLOOP SUT PDS II  0 18 (SUTURE)
ENDOLOOP SUT PDS II 0 18 (SUTURE) IMPLANT
GAUZE SPONGE 2X2 8PLY STRL LF (GAUZE/BANDAGES/DRESSINGS) ×1 IMPLANT
GLOVE BIOGEL PI IND STRL 7.0 (GLOVE) ×1 IMPLANT
GLOVE BIOGEL PI INDICATOR 7.0 (GLOVE) ×1
GLOVE ECLIPSE 8.0 STRL XLNG CF (GLOVE) ×2 IMPLANT
GLOVE INDICATOR 8.0 STRL GRN (GLOVE) ×4 IMPLANT
GOWN STRL NON-REIN LRG LVL3 (GOWN DISPOSABLE) ×4 IMPLANT
GOWN STRL REIN XL XLG (GOWN DISPOSABLE) ×4 IMPLANT
HOVERMATT SINGLE USE (MISCELLANEOUS) ×2 IMPLANT
KIT BASIN OR (CUSTOM PROCEDURE TRAY) ×2 IMPLANT
NS IRRIG 1000ML POUR BTL (IV SOLUTION) ×2 IMPLANT
PENCIL BUTTON HOLSTER BLD 10FT (ELECTRODE) ×2 IMPLANT
POUCH SPECIMEN RETRIEVAL 10MM (ENDOMECHANICALS) ×2 IMPLANT
RELOAD 45 VASCULAR/THIN (ENDOMECHANICALS) IMPLANT
RELOAD BLUE (STAPLE) ×2 IMPLANT
RELOAD STAPLE TA45 3.5 REG BLU (ENDOMECHANICALS) IMPLANT
SET IRRIG TUBING LAPAROSCOPIC (IRRIGATION / IRRIGATOR) ×2 IMPLANT
SOLUTION ANTI FOG 6CC (MISCELLANEOUS) ×2 IMPLANT
SPONGE GAUZE 2X2 STER 10/PKG (GAUZE/BANDAGES/DRESSINGS) ×1
STAPLE ECHEON FLEX 60 POW ENDO (STAPLE) ×2 IMPLANT
SUT MNCRL AB 4-0 PS2 18 (SUTURE) ×2 IMPLANT
SUT VICRYL 0 UR6 27IN ABS (SUTURE) ×4 IMPLANT
TOWEL OR 17X26 10 PK STRL BLUE (TOWEL DISPOSABLE) ×2 IMPLANT
TRAY FOLEY CATH 14FRSI W/METER (CATHETERS) ×2 IMPLANT
TRAY LAP CHOLE (CUSTOM PROCEDURE TRAY) ×2 IMPLANT
TROCAR XCEL BLADELESS 5X75MML (TROCAR) ×2 IMPLANT
TROCAR Z-THREAD FIOS 12X100MM (TROCAR) ×2 IMPLANT
TROCAR Z-THREAD FIOS 5X100MM (TROCAR) ×2 IMPLANT
TUBING INSUFFLATION 10FT LAP (TUBING) ×2 IMPLANT

## 2012-03-29 NOTE — Anesthesia Preprocedure Evaluation (Addendum)
Anesthesia Evaluation  Patient identified by MRN, date of birth, ID band Patient awake    Reviewed: Allergy & Precautions, H&P , NPO status , Patient's Chart, lab work & pertinent test results  Airway Mallampati: II TM Distance: >3 FB Neck ROM: Full    Dental No notable dental hx.    Pulmonary neg pulmonary ROS, asthma , sleep apnea, Continuous Positive Airway Pressure Ventilation and Oxygen sleep apnea , COPD COPD inhaler,  Oxygen 2l/min 24 hours a day. breath sounds clear to auscultation  Pulmonary exam normal       Cardiovascular hypertension, Pt. on medications + CAD and + Past MI Rhythm:Regular Rate:Normal  Recent syncope. Being worked up for a pacemaker currently with Holter monitor. Normally sees Dr. Lia Foyer. Reviewed notes of Dr. Burt Knack yesterday and today. Recent heart enzymes and BNP normal.   Neuro/Psych PSYCHIATRIC DISORDERS Depression  Neuromuscular disease    GI/Hepatic negative GI ROS, Neg liver ROS,   Endo/Other  Hypothyroidism Morbid obesity  Renal/GU negative Renal ROS  negative genitourinary   Musculoskeletal negative musculoskeletal ROS (+)   Abdominal   Peds negative pediatric ROS (+)  Hematology negative hematology ROS (+)   Anesthesia Other Findings   Reproductive/Obstetrics negative OB ROS                         Anesthesia Physical Anesthesia Plan  ASA: III  Anesthesia Plan: General   Post-op Pain Management:    Induction: Intravenous  Airway Management Planned: Oral ETT  Additional Equipment:   Intra-op Plan:   Post-operative Plan: Extubation in OR  Informed Consent: I have reviewed the patients History and Physical, chart, labs and discussed the procedure including the risks, benefits and alternatives for the proposed anesthesia with the patient or authorized representative who has indicated his/her understanding and acceptance.   Dental advisory  given  Plan Discussed with: CRNA  Anesthesia Plan Comments:         Anesthesia Quick Evaluation

## 2012-03-29 NOTE — Progress Notes (Signed)
    Subjective:  Still with mild abdominal discomfort but feeling better. No chest pain. No other complaints. NPO for appy today.  Objective:  Vital Signs in the last 24 hours: Temp:  [97.6 F (36.4 C)-98.7 F (37.1 C)] 97.9 F (36.6 C) (07/10 0606) Pulse Rate:  [86-99] 86  (07/10 0606) Resp:  [18-20] 20  (07/10 0606) BP: (99-135)/(62-84) 101/62 mmHg (07/10 0606) SpO2:  [94 %-100 %] 97 % (07/10 0606)  Intake/Output from previous day: 07/09 0701 - 07/10 0700 In: 1840 [P.O.:840; I.V.:900; IV Piggyback:100] Out: 8677 [Urine:1650]  Physical Exam: Pt is alert and oriented, obese woman in NAD HEENT: normal Neck: JVP - normal, carotids 2+= without bruits Lungs: CTA bilaterally CV: RRR without murmur or gallop Abd: soft, mild diffuse tenderness without guarding Ext: no C/C/E, distal pulses intact and equal Skin: warm/dry no rash   Lab Results:  Basename 03/29/12 0515 03/27/12 1756  WBC 7.0 10.1  HGB 10.2* 10.6*  PLT 176 200    Basename 03/29/12 0515 03/28/12 0615  NA 138 138  K 3.1* 3.4*  CL 98 98  CO2 31 31  GLUCOSE 101* 117*  BUN 8 11  CREATININE 0.71 0.86    Basename 03/28/12 0615 03/27/12 2346  TROPONINI <0.30 <0.30   Tele: sinus rhythm  Assessment/Plan:  1. Appendicitis - appreciate surgical consult, plans for appy today. On IV ABX. 2. CAD - no anginal symptoms, chest pain was highly atypical and actually was in upper abdomen (probably related to appendicitis). No further eval at this time. Continue med Rx. 3. HTN - controlled on current Rx 4. OSA - wearing CPAP and stable 5. Hypokalemia - will replete with IV KCL 6. Dispo - per surgical team. Really no acute cardiac issues at this time.  [name], M.D. 03/29/2012, 7:44 AM

## 2012-03-29 NOTE — Anesthesia Procedure Notes (Signed)
Procedure Name: Intubation Date/Time: 03/29/2012 10:54 AM Performed by: Danley Danker L Patient Re-evaluated:Patient Re-evaluated prior to inductionOxygen Delivery Method: Circle system utilized Preoxygenation: Pre-oxygenation with 100% oxygen Intubation Type: IV induction Ventilation: Mask ventilation without difficulty and Oral airway inserted - appropriate to patient size Tube type: Oral Tube size: 7.5 mm Number of attempts: 1 Airway Equipment and Method: Video-laryngoscopy Placement Confirmation: ETT inserted through vocal cords under direct vision,  breath sounds checked- equal and bilateral and positive ETCO2 Secured at: 21 cm Tube secured with: Tape Dental Injury: Teeth and Oropharynx as per pre-operative assessment  Difficulty Due To: Difficulty was anticipated, Difficult Airway- due to large tongue, Difficult Airway- due to reduced neck mobility and Difficult Airway- due to anterior larynx Future Recommendations: Recommend- induction with short-acting agent, and alternative techniques readily available Comments: Pt. Is morbidly obese with an history of anterior cervical fusion.  Went straight to glidescope without looking with conventional means

## 2012-03-29 NOTE — Transfer of Care (Signed)
Immediate Anesthesia Transfer of Care Note  Patient: [name] [name]  Procedure(s) Performed: Procedure(s) (LRB): APPENDECTOMY LAPAROSCOPIC (N/A)  Patient Location: PACU  Anesthesia Type: General  Level of Consciousness: awake and oriented  Airway & Oxygen Therapy: Patient Spontanous Breathing and on CPAP mask  Post-op Assessment: Report given to PACU RN and Post -op Vital signs reviewed and stable  Post vital signs: Reviewed and stable  Complications: No apparent anesthesia complications

## 2012-03-29 NOTE — Op Note (Signed)
03/27/2012 - 03/29/2012  12:40 PM  PATIENT:  [name] [name]  66 y.o. female  Patient Care Team: Biagio Borg, MD as PCP - General (Internal Medicine)  PRE-OPERATIVE DIAGNOSIS:  appendicitis  POST-OPERATIVE DIAGNOSIS:  Acute appendicitis with phlegmon  PROCEDURE:  Procedure(s): APPENDECTOMY LAPAROSCOPIC with partial cecectomy Lap lysis of adhesions  SURGEON:  Surgeon(s): Adin Hector, MD  ASSISTANT: none   ANESTHESIA:   local and general  EBL:  Total I/O In: 0  Out: 450 [Urine:350; Blood:100]  Delay start of Pharmacological VTE agent (>24hrs) due to surgical blood loss or risk of bleeding:  no  DRAINS: none   SPECIMEN:  Source of Specimen:  Appendix with cecal wall  DISPOSITION OF SPECIMEN:  PATHOLOGY  COUNTS:  YES  PLAN OF CARE: Admit to inpatient   PATIENT DISPOSITION:  PACU - hemodynamically stable.  INDICATIONS: Morbidly obese female with chest and upper abdominal pain.  Diffuse.  Cardiac workup negative.  Pain became more focal in the right lower abdomen.  CT scan confirmed appendicitis.  We recommended laparoscopic, possible open, exploration:  The anatomy & physiology of the digestive tract was discussed.  The pathophysiology of appendicitis was discussed.  Natural history risks without surgery was discussed.   I feel the risks of no intervention will lead to serious problems that outweigh the operative risks; therefore, I recommended diagnostic laparoscopy with removal of appendix to remove the pathology.  Laparoscopic & open techniques were discussed.   I noted a good likelihood this will help address the problem.    Risks such as bleeding, infection, abscess, leak, reoperation, possible ostomy, hernia, heart attack, death, and other risks were discussed.  Goals of post-operative recovery were discussed as well.  We will work to minimize complications.  Questions were answered.  The patient expresses understanding & wishes to proceed with surgery.  OR FINDINGS:  She had moderately dense adhesions of greater omentum involving the right side the abdomen.  She an inflamed phlegmon consistent with appendicitis With probable early perforation.  The appendiceal base him and these appendix her are quite thick and inflamed.  So was the cecal wall near the appendiceal base.  Terminal ileum and ascending colon the involved.  DESCRIPTION:   Informed consent was confirmed.  The patient underwent general anaesthesia without difficulty.  The patient was positioned appropriately.  VTE prevention in place.  The patient's abdomen was clipped, prepped, & draped in a sterile fashion.  Surgical timeout confirmed our plan.  The patient was positioned in reverse Trendelenburg.  Abdominal entry was gained using optical entry technique in the left upper abdomen.  Entry was clean.  I induced carbon dioxide insufflation.  Camera inspection revealed no injury.  Extra ports were carefully placed under direct laparoscopic visualization.  The right side of her abdomen was covered with omental adhesions.  I freed adhesions off using ultrasonic dissection.  It was most inflamed in the right lower abdomen.  Eventually freed the omentum off that area.  Reflected up.  Release small bowel.  I found the terminal ileum and the cecum.  I found an inflamed appendix that was diving somewhat retrocecally.   mobilized the terminal ileum and cecum and proximal ascending colon in a lateral to medial fashion.  I took care to avoid injuring any retroperitoneal structures.   I freed the appendix off its attachments to the ascending colon and cecal mesentery.  I elevated the appendix. I skeletonized the mesoappendix Taking it down to the day since it was quite  thickened and inflamed.  I carefully skeletonized this to the cecum.  I was able to free off the base of the appendix which was still viable.  I stapled the appendix off the cecum using a laparoscopic stapler. I took about 1/3 viable cecum, Avoiding the  ileocecal valve.  The distal cecum and in the ascending colon was not inflamed.  I did notice some bleeding in the cecal mesentery.  I controlled with focused isolated the cautery and ultrasonic dissection.  Staple line was clear.  I placed the appendix inside an EndoCatch bag and removed out the 12 mm port In the left flank.  I did cut open the fascial defect around 3 cm to get it all out as very thickened and inflamed..  I did copious irrigation. Hemostasis was good in the mesoappendix, colon mesentery, and retroperitoneum. Staple line was intact on the cecum with no bleeding. I washed out the pelvis, retrohepatic space and right paracolic gutter. I washed out the left side as well.  Hemostasis is good. There was no perforation or injury. Because the area cleaned up well after irrigation, I did not place a drain.  I aspirated the carbon dioxide. I removed the ports. I closed the 12 mm fascia site using a 0 Vicryl stitch Interrupted using a laparoscopic suture passer. I closed skin using 4-0 monocryl stitch.  Patient was extubated and sent to the recovery room.  I am trying to discuss the operative findings with the patient's family. I suspect the patient is going used in the hospital at least overnight and will need antibiotics for 5 more days. Questions answered. They expressed understanding and appreciation.

## 2012-03-29 NOTE — Progress Notes (Signed)
Doloris Servantes [id number] 1945/12/17  CARE TEAM:  PCP: [name], MD  Outpatient Care Team: Patient Care Team: Biagio Borg, MD as PCP - General (Internal Medicine)  Inpatient Treatment Team: Treatment Team: Attending Provider: Fay Records, MD; Consulting Physician: Michae Kava Lbcardiology, MD; Consulting Physician: Nolon Nations, MD; Technician: Merdis Delay Major, NT; Registered Nurse: Lemar Livings, RN; Technician: Lyman Speller, NT  Subjective:  Feeling better Still w RLQ pain No new events RN in room  Objective:  Vital signs:  Filed Vitals:   03/28/12 1359 03/28/12 2208 03/28/12 2320 03/29/12 0606  BP: 99/65 135/84  101/62  Pulse: 94 99 97 86  Temp: 97.6 F (36.4 C) 98.7 F (37.1 C)  97.9 F (36.6 C)  TempSrc: Oral Oral  Oral  Resp: 18 20 18 20   Height:      Weight:      SpO2: 100% 96% 94% 97%    Last BM Date: 03/28/12  Intake/Output   Yesterday:  07/09 0701 - 07/10 0700 In: 1840 [P.O.:840; I.V.:900; IV Piggyback:100] Out: 0045 [Urine:1650] This shift:  Total I/O In: 0  Out: 250 [Urine:250]  Bowel function:  Flatus: y  BM: n  Physical Exam:  General: Pt awake/alert/oriented x4 in no acute distress Eyes: PERRL, normal EOM.  Sclera clear.  No icterus Neuro: CN II-XII intact w/o focal sensory/motor deficits. Lymph: No head/neck/groin lymphadenopathy Psych:  No delerium/psychosis/paranoia.  Smiling HENT: Normocephalic, Mucus membranes moist.  No thrush Neck: Supple, No tracheal deviation Chest: No chest wall pain w good excursion CV:  Pulses intact.  Regular rhythm Abdomen: Soft.  Nondistended.  Mod tender RLQ.  Giant panniculus.  No incarcerated hernias. Ext:  SCDs BLE.  No mjr edema.  No cyanosis Skin: No petechiae / purpurae  Problem List:  Active Problems:  Acute appendicitis   Assessment  Abagale Amedeo Plenty  66 y.o. female     Procedure(s): APPENDECTOMY LAPAROSCOPIC  Appendicitis  Plan:  Lap appy:  The anatomy & physiology of the  digestive tract was discussed.  The pathophysiology of appendicitis was discussed.  Natural history risks without surgery was discussed.   I feel the risks of no intervention will lead to serious problems that outweigh the operative risks; therefore, I recommended diagnostic laparoscopy with removal of appendix to remove the pathology.  Laparoscopic & open techniques were discussed.   I noted a good likelihood this will help address the problem.    Risks such as bleeding, infection, abscess, leak, reoperation, possible ostomy, hernia, heart attack, death, and other risks were discussed.  Goals of post-operative recovery were discussed as well.  We will work to minimize complications.  Questions were answered.  The patient expresses understanding & wishes to proceed with surgery.   -VTE prophylaxis- SCDs, etc -mobilize as tolerated to help recovery  Adin Hector, M.D., F.A.C.S. Gastrointestinal and Minimally Invasive Surgery [facility] [address], [place], [place] [address] [phone] Main / Paging [phone] Voice Mail   03/29/2012  Results:   Labs: Results for orders placed during the hospital encounter of 03/27/12 (from the past 48 hour(s))  URINALYSIS, ROUTINE W REFLEX MICROSCOPIC     Status: Normal   Collection Time   03/27/12 12:10 PM      Component Value Range Comment   Color, Urine YELLOW  YELLOW    APPearance CLEAR  CLEAR    Specific Gravity, Urine 1.013  1.005 - 1.030    pH 7.5  5.0 - 8.0  Glucose, UA NEGATIVE  NEGATIVE mg/dL    Hgb urine dipstick NEGATIVE  NEGATIVE    Bilirubin Urine NEGATIVE  NEGATIVE    Ketones, ur NEGATIVE  NEGATIVE mg/dL    Protein, ur NEGATIVE  NEGATIVE mg/dL    Urobilinogen, UA 0.2  0.0 - 1.0 mg/dL    Nitrite NEGATIVE  NEGATIVE    Leukocytes, UA NEGATIVE  NEGATIVE MICROSCOPIC NOT DONE ON URINES WITH NEGATIVE PROTEIN, BLOOD, LEUKOCYTES, NITRITE, OR GLUCOSE <1000 mg/dL.  CBC WITH DIFFERENTIAL      Status: Abnormal   Collection Time   03/27/12 12:20 PM      Component Value Range Comment   WBC 8.5  4.0 - 10.5 K/uL    RBC 3.95  3.87 - 5.11 MIL/uL    Hemoglobin 11.5 (*) 12.0 - 15.0 g/dL    HCT 35.9 (*) 36.0 - 46.0 %    MCV 90.9  78.0 - 100.0 fL    MCH 29.1  26.0 - 34.0 pg    MCHC 32.0  30.0 - 36.0 g/dL    RDW 13.8  11.5 - 15.5 %    Platelets 218  150 - 400 K/uL    Neutrophils Relative 74  43 - 77 %    Neutro Abs 6.3  1.7 - 7.7 K/uL    Lymphocytes Relative 17  12 - 46 %    Lymphs Abs 1.4  0.7 - 4.0 K/uL    Monocytes Relative 7  3 - 12 %    Monocytes Absolute 0.6  0.1 - 1.0 K/uL    Eosinophils Relative 2  0 - 5 %    Eosinophils Absolute 0.2  0.0 - 0.7 K/uL    Basophils Relative 0  0 - 1 %    Basophils Absolute 0.0  0.0 - 0.1 K/uL   BASIC METABOLIC PANEL     Status: Abnormal   Collection Time   03/27/12 12:20 PM      Component Value Range Comment   Sodium 139  135 - 145 mEq/L    Potassium 3.4 (*) 3.5 - 5.1 mEq/L    Chloride 98  96 - 112 mEq/L    CO2 32  19 - 32 mEq/L    Glucose, Bld 116 (*) 70 - 99 mg/dL    BUN 13  6 - 23 mg/dL    Creatinine, Ser 0.78  0.50 - 1.10 mg/dL    Calcium 9.6  8.4 - 10.5 mg/dL    GFR calc non Af Amer 86 (*) >90 mL/min    GFR calc Af Amer >90  >90 mL/min   POCT I-STAT TROPONIN I     Status: Normal   Collection Time   03/27/12 12:25 PM      Component Value Range Comment   Troponin i, poc 0.00  0.00 - 0.08 ng/mL    Comment 3            POCT I-STAT TROPONIN I     Status: Normal   Collection Time   03/27/12  4:45 PM      Component Value Range Comment   Troponin i, poc 0.00  0.00 - 0.08 ng/mL    Comment 3            TSH     Status: Abnormal   Collection Time   03/27/12  5:56 PM      Component Value Range Comment   TSH 0.057 (*) 0.350 - 4.500 uIU/mL   CBC     Status: Abnormal  Collection Time   03/27/12  5:56 PM      Component Value Range Comment   WBC 10.1  4.0 - 10.5 K/uL    RBC 3.67 (*) 3.87 - 5.11 MIL/uL    Hemoglobin 10.6 (*) 12.0 - 15.0 g/dL      HCT 33.0 (*) 36.0 - 46.0 %    MCV 89.9  78.0 - 100.0 fL    MCH 28.9  26.0 - 34.0 pg    MCHC 32.1  30.0 - 36.0 g/dL    RDW 14.1  11.5 - 15.5 %    Platelets 200  150 - 400 K/uL   CARDIAC PANEL(CRET KIN+CKTOT+MB+TROPI)     Status: Normal   Collection Time   03/27/12  6:46 PM      Component Value Range Comment   Total CK 136  7 - 177 U/L    CK, MB 2.1  0.3 - 4.0 ng/mL    Troponin I <0.30  <0.30 ng/mL    Relative Index 1.5  0.0 - 2.5   AMYLASE     Status: Normal   Collection Time   03/27/12  6:46 PM      Component Value Range Comment   Amylase 72  0 - 105 U/L   HEPATIC FUNCTION PANEL     Status: Abnormal   Collection Time   03/27/12  6:46 PM      Component Value Range Comment   Total Protein 7.0  6.0 - 8.3 g/dL    Albumin 3.4 (*) 3.5 - 5.2 g/dL    AST 60 (*) 0 - 37 U/L    ALT 67 (*) 0 - 35 U/L    Alkaline Phosphatase 89  39 - 117 U/L    Total Bilirubin 0.6  0.3 - 1.2 mg/dL    Bilirubin, Direct 0.1  0.0 - 0.3 mg/dL    Indirect Bilirubin 0.5  0.3 - 0.9 mg/dL   D-DIMER, QUANTITATIVE     Status: Abnormal   Collection Time   03/27/12  6:46 PM      Component Value Range Comment   D-Dimer, Quant 0.58 (*) 0.00 - 0.48 ug/mL-FEU   LIPASE, BLOOD     Status: Normal   Collection Time   03/27/12  6:46 PM      Component Value Range Comment   Lipase 17  11 - 59 U/L   PRO B NATRIURETIC PEPTIDE     Status: Normal   Collection Time   03/27/12  6:46 PM      Component Value Range Comment   Pro B Natriuretic peptide (BNP) 54.9  0 - 125 pg/mL   CARDIAC PANEL(CRET KIN+CKTOT+MB+TROPI)     Status: Normal   Collection Time   03/27/12 11:46 PM      Component Value Range Comment   Total CK 163  7 - 177 U/L    CK, MB 1.8  0.3 - 4.0 ng/mL    Troponin I <0.30  <0.30 ng/mL    Relative Index 1.1  0.0 - 2.5   CARDIAC PANEL(CRET KIN+CKTOT+MB+TROPI)     Status: Abnormal   Collection Time   03/28/12  6:15 AM      Component Value Range Comment   Total CK 202 (*) 7 - 177 U/L    CK, MB 2.7  0.3 - 4.0 ng/mL     Troponin I <0.30  <0.30 ng/mL    Relative Index 1.3  0.0 - 2.5   BASIC METABOLIC PANEL     Status:  Abnormal   Collection Time   03/28/12  6:15 AM      Component Value Range Comment   Sodium 138  135 - 145 mEq/L    Potassium 3.4 (*) 3.5 - 5.1 mEq/L    Chloride 98  96 - 112 mEq/L    CO2 31  19 - 32 mEq/L    Glucose, Bld 117 (*) 70 - 99 mg/dL    BUN 11  6 - 23 mg/dL    Creatinine, Ser 0.86  0.50 - 1.10 mg/dL    Calcium 9.2  8.4 - 10.5 mg/dL    GFR calc non Af Amer 69 (*) >90 mL/min    GFR calc Af Amer 80 (*) >90 mL/min   CBC     Status: Abnormal   Collection Time   03/29/12  5:15 AM      Component Value Range Comment   WBC 7.0  4.0 - 10.5 K/uL    RBC 3.59 (*) 3.87 - 5.11 MIL/uL    Hemoglobin 10.2 (*) 12.0 - 15.0 g/dL    HCT 32.6 (*) 36.0 - 46.0 %    MCV 90.8  78.0 - 100.0 fL    MCH 28.4  26.0 - 34.0 pg    MCHC 31.3  30.0 - 36.0 g/dL    RDW 14.0  11.5 - 15.5 %    Platelets 176  150 - 400 K/uL   COMPREHENSIVE METABOLIC PANEL     Status: Abnormal   Collection Time   03/29/12  5:15 AM      Component Value Range Comment   Sodium 138  135 - 145 mEq/L    Potassium 3.1 (*) 3.5 - 5.1 mEq/L    Chloride 98  96 - 112 mEq/L    CO2 31  19 - 32 mEq/L    Glucose, Bld 101 (*) 70 - 99 mg/dL    BUN 8  6 - 23 mg/dL    Creatinine, Ser 0.71  0.50 - 1.10 mg/dL    Calcium 9.1  8.4 - 10.5 mg/dL    Total Protein 6.6  6.0 - 8.3 g/dL    Albumin 3.1 (*) 3.5 - 5.2 g/dL    AST 27  0 - 37 U/L    ALT 40 (*) 0 - 35 U/L    Alkaline Phosphatase 77  39 - 117 U/L    Total Bilirubin 0.5  0.3 - 1.2 mg/dL    GFR calc non Af Amer 89 (*) >90 mL/min    GFR calc Af Amer >90  >90 mL/min   MAGNESIUM     Status: Normal   Collection Time   03/29/12  5:15 AM      Component Value Range Comment   Magnesium 2.1  1.5 - 2.5 mg/dL     Imaging / Studies: Dg Chest 2 View  03/27/2012  *RADIOLOGY REPORT*  Clinical Data: Shortness of breath and chest pain.  CHEST - 2 VIEW  Comparison: 03/08/2012.  Findings: Trachea is midline.   Heart size stable.  Probable mild diffuse interstitial prominence and indistinctness.  No definite pleural fluid.  IMPRESSION: Suspect mild pulmonary edema.  Original Report Authenticated By: Luretha Rued, M.D.   Ct Abdomen Pelvis W Contrast  03/28/2012  *RADIOLOGY REPORT*  Clinical Data: Diffuse abdominal pain.  Fever.  Elevated liver function tests.  Breast carcinoma.  CT ABDOMEN AND PELVIS WITH CONTRAST  Technique:  Multidetector CT imaging of the abdomen and pelvis was performed following the standard protocol during bolus  administration of intravenous contrast.  Contrast: 1101m OMNIPAQUE IOHEXOL 300 MG/ML  SOLN  Comparison: None.  Findings: The liver, gallbladder, spleen, pancreas, adrenal glands, and right kidney are normal appearance.  A small left renal cyst is seen, however there is no evidence of renal mass or hydronephrosis. No other soft tissue masses or lymphadenopathy identified within the abdomen.  A left uterine fibroid is seen which measures approximately 7 cm. Shotty bilateral inguinal lymph nodes are seen, but no definite pathologically enlarged nodes are identified within the pelvis.  The appendix and base of the cecum show wall thickening, and there is moderate periappendiceal inflammatory change, consistent with acute appendicitis.  There is no evidence of abscess or free fluid. No evidence of bowel obstruction.  IMPRESSION:  1.  Positive for acute appendicitis.  No evidence of abscess or other complication. 2.  Large uterine fibroid measuring approximately 7 cm.  These results will be called to the ordering clinician or representative by the Radiologist Assistant, and communication documented in the PACS Dashboard.  Original Report Authenticated By: JMarlaine Hind M.D.    Medications / Allergies: per chart  Antibiotics: Anti-infectives     Start     Dose/Rate Route Frequency Ordered Stop   03/28/12 1700   cefTRIAXone (ROCEPHIN) 2 g in dextrose 5 % 50 mL IVPB        2 g 100  mL/hr over 30 Minutes Intravenous Every 24 hours 03/28/12 1534     03/28/12 1600   metroNIDAZOLE (FLAGYL) IVPB 500 mg        500 mg 100 mL/hr over 60 Minutes Intravenous Every 8 hours 03/28/12 1534

## 2012-03-29 NOTE — Anesthesia Postprocedure Evaluation (Signed)
  Anesthesia Post-op Note  Patient: [name] [name]  Procedure(s) Performed: Procedure(s) (LRB): APPENDECTOMY LAPAROSCOPIC (N/A)  Patient Location: PACU  Anesthesia Type: General  Level of Consciousness: awake and alert   Airway and Oxygen Therapy: Patient Spontanous Breathing  Post-op Pain: mild  Post-op Assessment: Post-op Vital signs reviewed, Patient's Cardiovascular Status Stable, Respiratory Function Stable, Patent Airway and No signs of Nausea or vomiting  Post-op Vital Signs: stable  Complications: No apparent anesthesia complications

## 2012-03-29 NOTE — Preoperative (Signed)
Beta Blockers   Reason not to administer Beta Blockers:Not Applicable 

## 2012-03-30 ENCOUNTER — Ambulatory Visit: Payer: Medicare Other | Admitting: Internal Medicine

## 2012-03-30 ENCOUNTER — Encounter (HOSPITAL_COMMUNITY): Payer: Self-pay | Admitting: Surgery

## 2012-03-30 DIAGNOSIS — R55 Syncope and collapse: Secondary | ICD-10-CM

## 2012-03-30 LAB — CBC
HCT: 31.8 % — ABNORMAL LOW (ref 36.0–46.0)
MCH: 28.4 pg (ref 26.0–34.0)
MCV: 91.1 fL (ref 78.0–100.0)
Platelets: 206 10*3/uL (ref 150–400)
RBC: 3.49 MIL/uL — ABNORMAL LOW (ref 3.87–5.11)
RDW: 13.7 % (ref 11.5–15.5)

## 2012-03-30 LAB — BASIC METABOLIC PANEL
CO2: 28 mEq/L (ref 19–32)
Calcium: 9.4 mg/dL (ref 8.4–10.5)
Creatinine, Ser: 0.65 mg/dL (ref 0.50–1.10)
Glucose, Bld: 126 mg/dL — ABNORMAL HIGH (ref 70–99)

## 2012-03-30 MED ORDER — AMOXICILLIN-POT CLAVULANATE 875-125 MG PO TABS: 1.0000 | ORAL_TABLET | Freq: Two times a day (BID) | ORAL | Status: DC

## 2012-03-30 MED ORDER — OXYCODONE HCL 5 MG PO TABS: 5.0000 mg | ORAL_TABLET | ORAL | Status: DC | PRN

## 2012-03-30 NOTE — Progress Notes (Signed)
    Subjective:  Feels much better today. No complaints. Specifically no abdominal pain, no chest pain.  Objective:  Vital Signs in the last 24 hours: Temp:  [97 F (36.1 C)-98.6 F (37 C)] 98.5 F (36.9 C) (07/11 0457) Pulse Rate:  [60-99] 84  (07/11 0457) Resp:  [14-18] 18  (07/11 0457) BP: (114-141)/(65-75) 141/75 mmHg (07/11 0457) SpO2:  [94 %-100 %] 98 % (07/11 0457) FiO2 (%):  [40 %] 40 % (07/10 1415)  Intake/Output from previous day: 07/10 0701 - 07/11 0700 In: 2450 [I.V.:2150; IV Piggyback:300] Out: 1350 [Urine:1250; Blood:100]  Physical Exam: Pt is alert and oriented, pleasant obese woman in NAD HEENT: normal Neck: JVP - normal, carotids 2+= without bruits Lungs: CTA bilaterally CV: RRR without murmur or gallop Abd: soft, NT, Positive BS, obese. Surgical sites look good. Ext: no C/C/E, distal pulses intact and equal Skin: warm/dry no rash   Lab Results:  Basename 03/30/12 0510 03/29/12 0515  WBC 9.7 7.0  HGB 9.9* 10.2*  PLT 206 176    Basename 03/30/12 0510 03/29/12 0515  NA 137 138  K 4.1 3.1*  CL 102 98  CO2 28 31  GLUCOSE 126* 101*  BUN 10 8  CREATININE 0.65 0.71    Basename 03/28/12 0615 03/27/12 2346  TROPONINI <0.30 <0.30   Tele: personally reviewed, sinus rhythm.  Assessment/Plan:  1. Acute appendicitis now s/p lab appy. Appreciate care of Dr Johney Maine and the surgical team. Anticipate discharge today but await surgical team opinion.  2. Syncope - no arrhythmia seen on several days of tele. Resume event monitor at discharge. Follow-up with Dr Lia Foyer in office.  3. Other problems (HTN, sleep apnea, CAD) are all stable and patient will resume home meds. Will arrange PA visit in our office after heart monitor is turned in.  [name], M.D. 03/30/2012, 6:33 AM

## 2012-03-30 NOTE — Progress Notes (Signed)
OK to D/C since meeting goals Instructions written

## 2012-03-30 NOTE — Discharge Summary (Signed)
Discharge Summary   Patient ID: [name] [name] MRN: [id number], DOB/AGE: November 24, 1945 66 y.o.  Primary MD: [name], MD Primary Cardiologist: Dr. Lia Foyer Admit date: 03/27/2012 D/C date:     03/30/2012      Primary Discharge Diagnoses:  1. Acute Appendicitis  - s/p Lap appy 03/29/12  - Cont abx per surgery and f/u with surgery  2. Decreased TSH with h/o Hypothyroidism on Synthroid  - TSH 0.057 in the setting of #1  - F/u with PCP   3. H/o Syncope/Symptomatic Pause  - Hospitalized 02/2012 found to have significant pauses  - BB and Provera dc'd and 28day event monitor placed  - No pauses/arrhythmias on telemetry this admission  - Cont monitor and f/u with cardiology after monitor turned in  4. Uterine Fibroid  - 7cm by CT 03/28/12  Secondary Discharge Diagnoses:  1. CAD, Nonobstructive by cath 2007 (EF 75%. RCA proximal 75% tubular, 25% prox LAD, 25% d1, 50% D2) at [facility] 2. HTN 3. HLD 4. OSA/OHS on CPAP 5. Anemia 6. COPD 7. Asthma 8. Obesity 9. Uterine Bleeding 10. Neuropathy 11. Cervical radiculopathy  12. Gout  13. Breast cancer s/p Breast lumpectomy  14. Depression 15. Ovarian cyst removal  16. Cervical disc surgery   Allergies Allergies  Allergen Reactions  . Codeine     REACTION: unknown - pt. states unaware of having reaction to codeine  . Lisinopril     REACTION: cough    Diagnostic Studies/Procedures:   03/28/2012 - Ct Abdomen Pelvis W Contrast Findings: The liver, gallbladder, spleen, pancreas, adrenal glands, and right kidney are normal appearance.  A small left renal cyst is seen, however there is no evidence of renal mass or hydronephrosis. No other soft tissue masses or lymphadenopathy identified within the abdomen.  A left uterine fibroid is seen which measures approximately 7 cm. Shotty bilateral inguinal lymph nodes are seen, but no definite pathologically enlarged nodes are identified within the pelvis.  The appendix and base of the cecum show wall  thickening, and there is moderate periappendiceal inflammatory change, consistent with acute appendicitis.  There is no evidence of abscess or free fluid. No evidence of bowel obstruction.  IMPRESSION:  1.  Positive for acute appendicitis.  No evidence of abscess or other complication. 2.  Large uterine fibroid measuring approximately 7 cm.   03/29/12 - Laparoscopic appendectomy with partial cecectomy and lysis of adhesions See Op Note for details  History of Present Illness: 66 y.o. female w/ the above medical problems who presented to [facility] on 03/27/12 with complaints of chest pain.  In June was hospitalized after syncopal episode and found to have significant pauses on telemetry for which she was discharged with event monitor. The evening prior to presentation she experienced epigastric pain that radiated to her back and left sided. No associated sob or diaphoresis. The pain lasted all night prompting her to present to the [place].  Hospital Course: In the ED EKG revealed NSR with no acute ST changes. Labs were significant for normal cardiac enzymes and WBC 8.5. She complained of abdominal pain while in the ER that was worse with palpation and improved somewhat after GI cocktail. She was admitted for further evaluation and treatment.   Cardiac enzymes were cycled and remained negative. She developed a low grade fever with mild leukocytosis and mildly elevated LFTs. Abdominal CT was obtained and revealed acute appendicitis without abscess and incidentally showed a 7cm uterine fibroid. She was evaluated by surgery, made  npo, and started on abx with plans for appendectomy. Laparoscopic appendectomy with partial cecectomy and lysis of adhesions was performed on 03/29/12. She tolerated the procedure well without complications. No arrhythmias or significant pauses were seen on telemetry. She remained afebrile and pain free and felt ready to go home. She will need to continue oral  antibiotics per surgery recommendations.  She was seen and evaluated by surgery and Dr. Burt Knack who felt she was stable for discharge home with plans for follow up as scheduled below.  Discharge Vitals: Blood pressure 127/68, pulse 98, temperature 98.3 F (36.8 C), temperature source Oral, resp. rate 16, height 5' 5"  (1.651 m), weight 346 lb 2 oz (157 kg), SpO2 96.00%.  Labs: Component Value Date   WBC 9.7 03/30/2012   HGB 9.9* 03/30/2012   HCT 31.8* 03/30/2012   MCV 91.1 03/30/2012   PLT 206 03/30/2012    Lab 03/30/12 0510 03/29/12 0515  NA 137 --  K 4.1 --  CL 102 --  CO2 28 --  BUN 10 --  CREATININE 0.65 --  CALCIUM 9.4 --  PROT -- 6.6  BILITOT -- 0.5  ALKPHOS -- 77  ALT -- 40*  AST -- 27  GLUCOSE 126* --   Basename 03/28/12 0615 03/27/12 2346 03/27/12 1846  CKTOTAL 202* 163 136  CKMB 2.7 1.8 2.1  TROPONINI <0.30 <0.30 <0.30     03/27/2012 17:56  TSH 0.057 (L)    Discharge Medications   Medication List  As of 03/30/2012  1:54 PM   TAKE these medications         allopurinol 300 MG tablet   Commonly known as: ZYLOPRIM   Take 300 mg by mouth daily.      amitriptyline 100 MG tablet   Commonly known as: ELAVIL   Take 1 tablet (100 mg total) by mouth at bedtime.      amLODipine 5 MG tablet   Commonly known as: NORVASC   Take 1 tablet (5 mg total) by mouth daily.      amoxicillin-clavulanate 875-125 MG per tablet   Commonly known as: AUGMENTIN   Take 1 tablet by mouth 2 (two) times daily.      aspirin 81 MG tablet   Take 81 mg by mouth daily.      DULoxetine 30 MG capsule   Commonly known as: CYMBALTA   Take 30 mg by mouth daily. Take 2 at bedtime      fish oil-omega-3 fatty acids 1000 MG capsule   Take 2 g by mouth daily.      furosemide 40 MG tablet   Commonly known as: LASIX   Take 40 mg by mouth 2 (two) times daily.      levothyroxine 200 MCG tablet   Commonly known as: SYNTHROID, LEVOTHROID   Take 200 mcg by mouth 2 (two) times daily.       oxyCODONE 5 MG immediate release tablet   Commonly known as: Oxy IR/ROXICODONE   Take 1-2 tablets (5-10 mg total) by mouth every 4 (four) hours as needed.      potassium chloride 10 MEQ CR tablet   Commonly known as: KLOR-CON   Take 10 mEq by mouth daily.      pravastatin 40 MG tablet   Commonly known as: PRAVACHOL   Take 40 mg by mouth daily.      PROAIR HFA 108 (90 BASE) MCG/ACT inhaler   Generic drug: albuterol   Inhale 2 puffs into the lungs every 6 (six)  hours as needed.      vitamin C 500 MG tablet   Commonly known as: ASCORBIC ACID   Take 500 mg by mouth daily.      Vitamin D3 2000 UNITS Tabs   Take 1 tablet by mouth daily.            Disposition   Discharge Orders    Future Appointments: Provider: Department: Dept Phone: Center:   04/10/2012 12:00 PM Hillary Bow, [place] [phone] [id number]   04/27/2012 9:45 AM Rigoberto Noel, MD [facility] [phone] None   06/27/2012 1:30 PM Biagio Borg, MD [facility] [phone] [facility]     Future Orders Please Complete By Expires   Diet - low sodium heart healthy      Increase activity slowly      Discharge instructions      Comments:   **PLEASE REMEMBER TO BRING ALL OF YOUR MEDICATIONS TO EACH OF YOUR FOLLOW-UP OFFICE VISITS.  * Please follow instructions provided by the surgical team and follow up with them as instructed.  * Please turn your event monitor into the office as previously arranged.  * Please follow up with your primary care provider regarding your abnormal thyroid blood levels as well as the uterine fibroid seen on CT scan during this hospitalization.     Follow-up Information    Follow up with GROSS,STEVEN C., MD. Schedule an appointment as soon as possible for a visit in 2 weeks.   Contact information:   [facility] [address] [place] [phone]       Follow up with Bing Quarry, MD on 04/10/2012. (12:00)    Contact  information:   [address] [place] [phone]           Outstanding Labs/Studies:  1. Follow up TSH/T4/T3 with PCP  Duration of Discharge Encounter: Greater than 30 minutes including physician and PA time.  Signed, Homer Miller PA-C 03/30/2012, 1:54 PM

## 2012-03-30 NOTE — Discharge Summary (Signed)
Agree as above. See my progress note this same date.  [name] 03/30/2012

## 2012-03-30 NOTE — Progress Notes (Signed)
1 Day Post-Op  Subjective: She is doing great, no complaints of pain except when she coughs.  Wants to go home.  Objective: Vital signs in last 24 hours: Temp:  [97 F (36.1 C)-98.6 F (37 C)] 98.5 F (36.9 C) (07/11 0457) Pulse Rate:  [60-99] 84  (07/11 0457) Resp:  [14-18] 18  (07/11 0457) BP: (126-141)/(66-75) 141/75 mmHg (07/11 0457) SpO2:  [94 %-100 %] 98 % (07/11 0457) FiO2 (%):  [40 %] 40 % (07/10 1415) Last BM Date: 03/28/12  Diet:  Bland, nothing about PO recorded. Afebrile, VSS, labs this AM are normal.  Intake/Output from previous day: 07/10 0701 - 07/11 0700 In: 3050 [I.V.:2750; IV Piggyback:300] Out: 1800 [Urine:1700; Blood:100] Intake/Output this shift:    General appearance: alert, cooperative and no distress GI: soft, minimal tenderness, incsions look good, tolerating full diet, No BM so far.  Lab Results:   [facility] 03/30/12 0510 03/29/12 0515  WBC 9.7 7.0  HGB 9.9* 10.2*  HCT 31.8* 32.6*  PLT 206 176    BMET  Basename 03/30/12 0510 03/29/12 0515  NA 137 138  K 4.1 3.1*  CL 102 98  CO2 28 31  GLUCOSE 126* 101*  BUN 10 8  CREATININE 0.65 0.71  CALCIUM 9.4 9.1   PT/INR No results found for this basename: LABPROT:2,INR:2 in the last 72 hours   Lab 03/29/12 0515 03/27/12 1846  AST 27 60*  ALT 40* 67*  ALKPHOS 77 89  BILITOT 0.5 0.6  PROT 6.6 7.0  ALBUMIN 3.1* 3.4*     Lipase     Component Value Date/Time   LIPASE 17 03/27/2012 1846     Studies/Results: Ct Abdomen Pelvis W Contrast  03/28/2012  *RADIOLOGY REPORT*  Clinical Data: Diffuse abdominal pain.  Fever.  Elevated liver function tests.  Breast carcinoma.  CT ABDOMEN AND PELVIS WITH CONTRAST  Technique:  Multidetector CT imaging of the abdomen and pelvis was performed following the standard protocol during bolus administration of intravenous contrast.  Contrast: 173m OMNIPAQUE IOHEXOL 300 MG/ML  SOLN  Comparison: None.  Findings: The liver, gallbladder, spleen, pancreas, adrenal  glands, and right kidney are normal appearance.  A small left renal cyst is seen, however there is no evidence of renal mass or hydronephrosis. No other soft tissue masses or lymphadenopathy identified within the abdomen.  A left uterine fibroid is seen which measures approximately 7 cm. Shotty bilateral inguinal lymph nodes are seen, but no definite pathologically enlarged nodes are identified within the pelvis.  The appendix and base of the cecum show wall thickening, and there is moderate periappendiceal inflammatory change, consistent with acute appendicitis.  There is no evidence of abscess or free fluid. No evidence of bowel obstruction.  IMPRESSION:  1.  Positive for acute appendicitis.  No evidence of abscess or other complication. 2.  Large uterine fibroid measuring approximately 7 cm.  These results will be called to the ordering clinician or representative by the Radiologist Assistant, and communication documented in the PACS Dashboard.  Original Report Authenticated By: JMarlaine Hind M.D.    Medications:    . allopurinol  300 mg Oral Daily  . amitriptyline  100 mg Oral QHS  . amLODipine  5 mg Oral Daily  . aspirin EC  81 mg Oral Daily  . cefTRIAXone (ROCEPHIN)  IV  2 g Intravenous Q24H  . cholecalciferol  2,000 Units Oral Daily  . DULoxetine  30 mg Oral Daily  . furosemide  40 mg Oral BID  .  heparin  5,000 Units Subcutaneous Q8H  . levothyroxine  200 mcg Oral BID  . metronidazole  500 mg Intravenous Q8H  . naproxen  500 mg Oral BID WC  . omega-3 acid ethyl esters  2 g Oral Daily  . potassium chloride  10 mEq Intravenous Q1 Hr x 4  . potassium chloride  20 mEq Oral q morning - 10a  . potassium chloride  30 mEq Oral Once  . simvastatin  20 mg Oral q1800  . sodium chloride  3 mL Intravenous Q12H  . vitamin C  500 mg Oral Daily  . DISCONTD: furosemide  40 mg Intravenous Once  . DISCONTD: heparin  5,000 Units Subcutaneous Q8H  . DISCONTD: ketorolac  30 mg Intravenous Once     Assessment/Plan 1. Acute appendicitis with phlegmon,  S/p  APPENDECTOMY LAPAROSCOPIC with partial cecectomy  Lap lysis of adhesions 03/29/12 DR. Gross 2. Recent hospitalization for syncope; and a half of bradycardia with pauses on telemetry. Currently undergoing 28 day Holter studies.  3. Noncritical coronary artery disease with an EF of 75%.  4. Obstructive sleep apnea with CPAP.  5. Obesity hypoventilation syndrome on oxygen.  6. COPD with a 20 year history of tobacco use.  7. Hypertension  8. Hypothyroid  9. BMI 57  10. History of depression  11. History of exploratory laparotomy for ovarian disease. CT evidence of a 7 cm uterine fibroid currently.   Plan:  i think she can go home too.  I will check on need and duration of antibiotics when Dr. Johney Maine is out of OR. She can go home on plain tylenol, NSAID if you feel it's OK, and oxycodone.     LOS: 3 days    [name] [name] 03/30/2012

## 2012-03-30 NOTE — Progress Notes (Signed)
Patient verbalizes understanding of discharge instructions including, but not limited to, follow up, prescriptions, incision site care, activity, etc... Patient stable for discharge and awaits arrival of daughter for transport home. [name] [name]

## 2012-03-30 NOTE — Care Management Note (Signed)
    Page 1 of 1   03/30/2012     2:08:28 PM   CARE MANAGEMENT NOTE 03/30/2012  Patient:  [name] [name]   Account Number:  [id number]  Date Initiated:  03/30/2012  Documentation initiated by:  Dessa Phi  Subjective/Objective Assessment:   ADMITTED W/ABD PAIN     Action/Plan:   FROM HOME   Anticipated DC Date:  03/30/2012   Anticipated DC Plan:  [place]  CM consult      Choice offered to / List presented to:             Status of service:  Completed, signed off Medicare Important Message given?   (If response is "NO", the following Medicare IM given date fields will be blank) Date Medicare IM given:   Date Additional Medicare IM given:    Discharge Disposition:  HOME/SELF CARE  Per UR Regulation:  Reviewed for med. necessity/level of care/duration of stay  If discussed at [place] of Stay Meetings, dates discussed:    Comments:  03/30/12 [name] Tarbet RN,BSN NCM [phone] POD#1 LAP APPY.

## 2012-04-01 ENCOUNTER — Emergency Department (HOSPITAL_COMMUNITY): Payer: Medicare Other

## 2012-04-01 ENCOUNTER — Encounter (HOSPITAL_COMMUNITY): Payer: Self-pay | Admitting: Emergency Medicine

## 2012-04-01 ENCOUNTER — Emergency Department (HOSPITAL_COMMUNITY)
Admission: EM | Admit: 2012-04-01 | Discharge: 2012-04-01 | Disposition: A | Discharge status: Home / Self Care | Payer: Medicare Other | Attending: Emergency Medicine | Admitting: Emergency Medicine

## 2012-04-01 DIAGNOSIS — F329 Major depressive disorder, single episode, unspecified: Secondary | ICD-10-CM | POA: Diagnosis not present

## 2012-04-01 DIAGNOSIS — J449 Chronic obstructive pulmonary disease, unspecified: Secondary | ICD-10-CM | POA: Diagnosis not present

## 2012-04-01 DIAGNOSIS — M79609 Pain in unspecified limb: Secondary | ICD-10-CM | POA: Diagnosis not present

## 2012-04-01 DIAGNOSIS — Z862 Personal history of diseases of the blood and blood-forming organs and certain disorders involving the immune mechanism: Secondary | ICD-10-CM | POA: Insufficient documentation

## 2012-04-01 DIAGNOSIS — Z853 Personal history of malignant neoplasm of breast: Secondary | ICD-10-CM | POA: Diagnosis not present

## 2012-04-01 DIAGNOSIS — G589 Mononeuropathy, unspecified: Secondary | ICD-10-CM | POA: Diagnosis not present

## 2012-04-01 DIAGNOSIS — F3289 Other specified depressive episodes: Secondary | ICD-10-CM | POA: Insufficient documentation

## 2012-04-01 DIAGNOSIS — Z79899 Other long term (current) drug therapy: Secondary | ICD-10-CM | POA: Insufficient documentation

## 2012-04-01 DIAGNOSIS — Z8639 Personal history of other endocrine, nutritional and metabolic disease: Secondary | ICD-10-CM | POA: Insufficient documentation

## 2012-04-01 DIAGNOSIS — R0602 Shortness of breath: Secondary | ICD-10-CM | POA: Insufficient documentation

## 2012-04-01 DIAGNOSIS — I2510 Atherosclerotic heart disease of native coronary artery without angina pectoris: Secondary | ICD-10-CM | POA: Diagnosis not present

## 2012-04-01 DIAGNOSIS — E662 Morbid (severe) obesity with alveolar hypoventilation: Secondary | ICD-10-CM

## 2012-04-01 DIAGNOSIS — J4489 Other specified chronic obstructive pulmonary disease: Secondary | ICD-10-CM | POA: Insufficient documentation

## 2012-04-01 DIAGNOSIS — IMO0002 Reserved for concepts with insufficient information to code with codable children: Secondary | ICD-10-CM | POA: Diagnosis not present

## 2012-04-01 DIAGNOSIS — E039 Hypothyroidism, unspecified: Secondary | ICD-10-CM | POA: Insufficient documentation

## 2012-04-01 DIAGNOSIS — I10 Essential (primary) hypertension: Secondary | ICD-10-CM | POA: Insufficient documentation

## 2012-04-01 DIAGNOSIS — E785 Hyperlipidemia, unspecified: Secondary | ICD-10-CM | POA: Diagnosis not present

## 2012-04-01 DIAGNOSIS — M5137 Other intervertebral disc degeneration, lumbosacral region: Secondary | ICD-10-CM | POA: Diagnosis not present

## 2012-04-01 DIAGNOSIS — M545 Low back pain: Secondary | ICD-10-CM | POA: Diagnosis not present

## 2012-04-01 DIAGNOSIS — M47817 Spondylosis without myelopathy or radiculopathy, lumbosacral region: Secondary | ICD-10-CM | POA: Diagnosis not present

## 2012-04-01 DIAGNOSIS — I517 Cardiomegaly: Secondary | ICD-10-CM | POA: Diagnosis not present

## 2012-04-01 DIAGNOSIS — M5410 Radiculopathy, site unspecified: Secondary | ICD-10-CM

## 2012-04-01 DIAGNOSIS — M5416 Radiculopathy, lumbar region: Secondary | ICD-10-CM

## 2012-04-01 DIAGNOSIS — I509 Heart failure, unspecified: Secondary | ICD-10-CM | POA: Diagnosis not present

## 2012-04-01 DIAGNOSIS — G629 Polyneuropathy, unspecified: Secondary | ICD-10-CM

## 2012-04-01 DIAGNOSIS — R209 Unspecified disturbances of skin sensation: Secondary | ICD-10-CM | POA: Diagnosis not present

## 2012-04-01 LAB — URINALYSIS, ROUTINE W REFLEX MICROSCOPIC
Bilirubin Urine: NEGATIVE
Ketones, ur: NEGATIVE mg/dL
Nitrite: NEGATIVE
Protein, ur: NEGATIVE mg/dL
Specific Gravity, Urine: 1.024 (ref 1.005–1.030)
Urobilinogen, UA: 0.2 mg/dL (ref 0.0–1.0)

## 2012-04-01 LAB — CBC WITH DIFFERENTIAL/PLATELET
Basophils Absolute: 0 10*3/uL (ref 0.0–0.1)
Basophils Relative: 0 % (ref 0–1)
Eosinophils Absolute: 0.4 10*3/uL (ref 0.0–0.7)
Eosinophils Relative: 5 % (ref 0–5)
HCT: 34.2 % — ABNORMAL LOW (ref 36.0–46.0)
Hemoglobin: 10.7 g/dL — ABNORMAL LOW (ref 12.0–15.0)
Lymphocytes Relative: 33 % (ref 12–46)
Lymphs Abs: 2.2 10*3/uL (ref 0.7–4.0)
MCH: 28.6 pg (ref 26.0–34.0)
MCHC: 31.3 g/dL (ref 30.0–36.0)
MCV: 91.4 fL (ref 78.0–100.0)
Monocytes Absolute: 0.5 10*3/uL (ref 0.1–1.0)
Monocytes Relative: 8 % (ref 3–12)
Neutro Abs: 3.7 10*3/uL (ref 1.7–7.7)
Neutrophils Relative %: 54 % (ref 43–77)
Platelets: 224 10*3/uL (ref 150–400)
RBC: 3.74 MIL/uL — ABNORMAL LOW (ref 3.87–5.11)
RDW: 13.8 % (ref 11.5–15.5)
WBC: 6.8 10*3/uL (ref 4.0–10.5)

## 2012-04-01 LAB — POCT I-STAT, CHEM 8
Calcium, Ion: 1.21 mmol/L (ref 1.13–1.30)
Creatinine, Ser: 0.8 mg/dL (ref 0.50–1.10)
Glucose, Bld: 90 mg/dL (ref 70–99)
HCT: 34 % — ABNORMAL LOW (ref 36.0–46.0)
Hemoglobin: 11.6 g/dL — ABNORMAL LOW (ref 12.0–15.0)
TCO2: 31 mmol/L (ref 0–100)

## 2012-04-01 MED ORDER — FUROSEMIDE 10 MG/ML IJ SOLN
40.0000 mg | Freq: Once | INTRAMUSCULAR | Status: AC
  Administered 2012-04-01: 40 mg via INTRAVENOUS
  Filled 2012-04-01: qty 4

## 2012-04-01 NOTE — Progress Notes (Signed)
VASCULAR LAB PRELIMINARY  PRELIMINARY  PRELIMINARY  PRELIMINARY  Right lower extremity venous Doppler completed.    Preliminary report:  There is no DVT or SVT noted in the right lower extremity.  [name] [name], 04/01/2012, 8:29 AM

## 2012-04-01 NOTE — ED Notes (Signed)
Pt passed swallow screen without difficulty

## 2012-04-01 NOTE — ED Notes (Signed)
Doppler study being performed.

## 2012-04-01 NOTE — ED Notes (Signed)
Patient returned from X-ray 

## 2012-04-01 NOTE — ED Notes (Signed)
MD at bedside. 

## 2012-04-01 NOTE — Consult Note (Signed)
[facility] History and Physical  Cleopatra Sardo [facility]MC:[id number] DOB: [birth year] DOA: 04/01/2012   PCP:   [name], MD   Reason for the consult: right leg numbness and the necessity of hospitalization.  HPI: [name] [name] is an 66 y.o. female with multiple complex medical history including morbid obesity, bradyarrythmia, hypoventilatory syndrome, recently discharged from the hospital for appendicitis, recent syncope, hypothyroidism, known CAD, who presents today as she had right leg numbness from the right hip to the right knee.  She denied any pain, weakness, abdominal pain, nausea, or vomitting.  She has no fever or chills, denied productive coughs.  She has had no headaches, weakness, calf tenderness or swelling.  Work  Up in the ER included an negative head CT for any acute CVA, normal HB, nl renal fx tests, and CXR showed mild vascular congestion.  She has no chest pain or pleuritic pain.    Rewiew of Systems:   The patient denies anorexia, fever, weight loss,, vision loss, decreased hearing, hoarseness, chest pain, syncope, dyspnea on exertion, peripheral edema, balance deficits, hemoptysis, abdominal pain, melena, hematochezia, severe indigestion/heartburn, hematuria, incontinence, genital sores, muscle weakness, suspicious skin lesions, transient blindness, difficulty walking, depression, unusual weight change, abnormal bleeding, enlarged lymph nodes, angioedema, and breast masses.    Past Medical History  Diagnosis Date  . Other and unspecified hyperlipidemia   . Unspecified essential hypertension   . Anemia   . CAD (coronary artery disease)     a. Nonobstructive CAD 2007 (EF 75%.  RCA  proximal 75% tubular, 25% prox LAD, 25% d1, 50% D2) at [facility]. b. NSTEMI 01/2010 in setting of respiratory failure, medical approach (not a good candidate for noninvasive eval)  . OSA on CPAP   . Hypothyroidism   . Peripheral edema 06/13/2011  . Asthma 06/13/2011  . Depression 06/13/2011  .  Neuropathy 06/13/2011  . Cervical radiculopathy 06/13/2011  . Gout 06/13/2011  . Obesity hypoventilation syndrome 06/13/2011  . COPD (chronic obstructive pulmonary disease)   . Breast cancer dx'd 2005    left  . Cellulitis     hx of cellulitis in left leg    Past Surgical History  Procedure Date  . Ovarian cyst removal   . Breast lumpectomy   . Cervical disc surgery     have cadaver bones,, screws, and plates   . Cervical biopsy June 2013    at [facility] for Heavy  Bleeding  . Dilation and curettage of uterus   . Colonoscopy   . Laparoscopic appendectomy 03/29/2012    Procedure: APPENDECTOMY LAPAROSCOPIC;  Surgeon: Adin Hector, MD;  Location: [facility];  Service: General;  Laterality: N/A;    Medications:  HOME MEDS: Prior to Admission medications   Medication Sig Start Date End Date Taking? Authorizing Provider  albuterol (PROAIR HFA) 108 (90 BASE) MCG/ACT inhaler Inhale 2 puffs into the lungs every 6 (six) hours as needed.     Yes Historical Provider, MD  allopurinol (ZYLOPRIM) 300 MG tablet Take 300 mg by mouth daily. 06/10/11  Yes Biagio Borg, MD  amitriptyline (ELAVIL) 100 MG tablet Take 1 tablet (100 mg total) by mouth at bedtime. 02/18/12  Yes Biagio Borg, MD  amLODipine (NORVASC) 5 MG tablet Take 1 tablet (5 mg total) by mouth daily. 03/13/12 03/13/13 Yes Barton Dubois, MD  amoxicillin-clavulanate (AUGMENTIN) 875-125 MG per tablet Take 1 tablet by mouth 2 (two) times daily. 03/30/12 04/09/12 Yes Adin Hector, MD  aspirin 81 MG tablet Take 81 mg by  mouth daily.     Yes Historical Provider, MD  Cholecalciferol (VITAMIN D3) 2000 UNITS TABS Take 1 tablet by mouth daily.     Yes Historical Provider, MD  DULoxetine (CYMBALTA) 30 MG capsule Take 30 mg by mouth daily. Take 2 at bedtime 11/09/11  Yes Biagio Borg, MD  fish oil-omega-3 fatty acids 1000 MG capsule Take 2 g by mouth daily.     Yes Historical Provider, MD  furosemide (LASIX) 40 MG tablet Take 40 mg by mouth 2 (two) times  daily. 03/13/12  Yes Barton Dubois, MD  levothyroxine (SYNTHROID, LEVOTHROID) 200 MCG tablet Take 200 mcg by mouth 2 (two) times daily. 02/18/12  Yes Biagio Borg, MD  oxyCODONE (OXY IR/ROXICODONE) 5 MG immediate release tablet Take 1-2 tablets (5-10 mg total) by mouth every 4 (four) hours as needed. 03/30/12 04/09/12 Yes Adin Hector, MD  potassium chloride (KLOR-CON) 10 MEQ CR tablet Take 10 mEq by mouth daily. 06/08/11  Yes Biagio Borg, MD  pravastatin (PRAVACHOL) 40 MG tablet Take 40 mg by mouth daily.   Yes Historical Provider, MD  vitamin C (ASCORBIC ACID) 500 MG tablet Take 500 mg by mouth daily.     Yes Historical Provider, MD     Allergies:  Allergies  Allergen Reactions  . Codeine     REACTION: unknown - pt. states unaware of having reaction to codeine  . Lisinopril     REACTION: cough    Social History:   reports that she quit smoking about 13 years ago. Her smoking use included Cigarettes. She has a 20 pack-year smoking history. She has never used smokeless tobacco. She reports that she drinks alcohol. She reports that she does not use illicit drugs.  Family History: Family History  Problem Relation Age of Onset  . Breast cancer    . Uterine cancer Mother      Physical Exam: Filed Vitals:   04/01/12 0028 04/01/12 0039  BP: 110/68   Pulse: 89   Temp: 98 F (36.7 C)   Resp: 16   SpO2: 98% 97%   Blood pressure 110/68, pulse 89, temperature 98 F (36.7 C), resp. rate 16, SpO2 97.00%.  GEN:  Pleasant morbidly obese person lying in the stretcher in no acute distress; cooperative with exam PSYCH:  alert and oriented x4; does not appear anxious or depressed; affect is appropriate. HEENT: Mucous membranes pink and anicteric; PERRLA; EOM intact; no cervical lymphadenopathy nor thyromegaly or carotid bruit; no JVD; Breasts:: Not examined CHEST WALL: No tenderness CHEST: Normal respiration, clear to auscultation bilaterally HEART: Regular rate and rhythm; no murmurs  rubs or gallops BACK: No kyphosis or scoliosis; no CVA tenderness ABDOMEN: Obese, soft non-tender; no masses, no organomegaly, normal abdominal bowel sounds; no pannus; no intertriginous candida. Rectal Exam: Not done EXTREMITIES: hip exam without any tenderness.  No calf swelling or tenderness.  Streghths are OK bilaterally. Genitalia: not examined PULSES: 2+ and symmetric SKIN: Normal hydration no rash or ulceration CNS: Cranial nerves 2-12 grossly intact no focal lateralizing neurologic deficit   Labs on Admission:  Basic Metabolic Panel:  Lab 60/63/01 0302 03/30/12 0510 03/29/12 0515 03/28/12 0615 03/27/12 1220  NA 141 137 138 138 139  K 3.7 4.1 3.1* 3.4* 3.4*  CL 100 102 98 98 98  CO2 -- 28 31 31  32  GLUCOSE 90 126* 101* 117* 116*  BUN 16 10 8 11 13   CREATININE 0.80 0.65 0.71 0.86 0.78  CALCIUM -- 9.4 9.1 9.2 9.6  MG -- -- 2.1 -- --  PHOS -- -- -- -- --   Liver Function Tests:  Lab 03/29/12 0515 03/27/12 1846  AST 27 60*  ALT 40* 67*  ALKPHOS 77 89  BILITOT 0.5 0.6  PROT 6.6 7.0  ALBUMIN 3.1* 3.4*    Lab 03/27/12 1846  LIPASE 17  AMYLASE 72   No results found for this basename: AMMONIA:5 in the last 168 hours CBC:  Lab 04/01/12 0302 04/01/12 0225 03/30/12 0510 03/29/12 0515 03/27/12 1756 03/27/12 1220  WBC -- 6.8 9.7 7.0 10.1 8.5  NEUTROABS -- 3.7 -- -- -- 6.3  HGB 11.6* 10.7* 9.9* 10.2* 10.6* --  HCT 34.0* 34.2* 31.8* 32.6* 33.0* --  MCV -- 91.4 91.1 90.8 89.9 90.9  PLT -- 224 206 176 200 218   Cardiac Enzymes:  Lab 03/28/12 0615 03/27/12 2346 03/27/12 1846  CKTOTAL 202* 163 136  CKMB 2.7 1.8 2.1  CKMBINDEX -- -- --  TROPONINI <0.30 <0.30 <0.30   BNP: No components found with this basename: POCBNP:5 CBG: No results found for this basename: GLUCAP:5 in the last 168 hours  Radiological Exams on Admission: Dg Chest 2 View  04/01/2012  *RADIOLOGY REPORT*  Clinical Data: Shortness of breath  CHEST - 2 VIEW  Comparison: 03/27/2012  Findings:  Cardiomegaly.  Central vascular congestion.  Mild vascular cephalization.  No definite pleural effusion.  No pneumothorax.  Mild interstitial prominence.  No acute osseous finding.  Cervical fusion hardware partially imaged.  IMPRESSION: Cardiomegaly with central vascular congestion and suggestion of mild edema pattern.  Original Report Authenticated By: Suanne Marker, M.D.   Ct Head Wo Contrast  04/01/2012  *RADIOLOGY REPORT*  Clinical Data: Right leg pain.  Rule out stroke.  Some numbness right hip extending to the knee.  CT HEAD WITHOUT CONTRAST  Technique:  Contiguous axial images were obtained from the base of the skull through the vertex without contrast.  Comparison: 03/08/2012.  Findings: No intracranial hemorrhage.  No CT evidence of large acute infarct.  No hydrocephalus.  No intracranial mass lesion detected on this unenhanced exam.  Exophthalmos.  Visualized sinuses and mastoid air cells clear.  Sella may be partially empty.  IMPRESSION: No CT evidence of large acute infarct.  Original Report Authenticated By: Doug Sou, M.D.       Assessment and Recommendations:  Right upper leg numbness:   I suspect she has neuropathy.  She is at risk for a DVT, but she has no pain nor swelling.  I don't think she needs to be admitted necessarily, but it may be prudent to obtain a leg [place] especially if her symptoms persists, or causing pain or leg swelling.    With respect to her mild congestion in her CXR, she has no shortness of breath and clinically is not in CHF, would give one dose of IV lasix at 52m.  She can follow up with her PCP for all her medical problems as they are currently stable and no immediate intervention is indicated. She should return sooner if she has worsening of her symptoms.  Thank you for the consult.     Other plans as per orders.      Jeffery Bachmeier Nocturnist [facility] Pager [phone] 04/01/2012, 4:24 AM

## 2012-04-01 NOTE — ED Notes (Signed)
Pt reports that she does not want to be stuck again for IV reinsertion until she knows for sure that she is admitted. Pt endorses being stuck 7 times since arrival to ED, will inform EDP and monitor.

## 2012-04-01 NOTE — ED Provider Notes (Addendum)
History     CSN: [id number]  Arrival date & time 04/01/12  0011   First MD Initiated Contact with Patient 04/01/12 0121      Chief Complaint  Patient presents with  . Leg Pain    (Consider location/radiation/quality/duration/timing/severity/associated sxs/prior treatment) The history is provided by the patient.   No swelling.  No SOB no CP, .  No weakness.  No changes in speech or vision.  No HA.  No CP, no SOB no n/v/d.  No f/c/r.  No abdominal pain.  No trauma.  No back pain.  No numbness at other sites.    Past Medical History  Diagnosis Date  . Other and unspecified hyperlipidemia   . Unspecified essential hypertension   . Anemia   . CAD (coronary artery disease)     a. Nonobstructive CAD 2007 (EF 75%.  RCA  proximal 75% tubular, 25% prox LAD, 25% d1, 50% D2) at [facility]. b. NSTEMI 01/2010 in setting of respiratory failure, medical approach (not a good candidate for noninvasive eval)  . OSA on CPAP   . Hypothyroidism   . Peripheral edema 06/13/2011  . Asthma 06/13/2011  . Depression 06/13/2011  . Neuropathy 06/13/2011  . Cervical radiculopathy 06/13/2011  . Gout 06/13/2011  . Obesity hypoventilation syndrome 06/13/2011  . COPD (chronic obstructive pulmonary disease)   . Breast cancer dx'd 2005    left  . Cellulitis     hx of cellulitis in left leg    Past Surgical History  Procedure Date  . Ovarian cyst removal   . Breast lumpectomy   . Cervical disc surgery     have cadaver bones,, screws, and plates   . Cervical biopsy June 2013    at [facility] for Heavy  Bleeding  . Dilation and curettage of uterus   . Colonoscopy   . Laparoscopic appendectomy 03/29/2012    Procedure: APPENDECTOMY LAPAROSCOPIC;  Surgeon: Adin Hector, MD;  Location: [facility];  Service: General;  Laterality: N/A;    Family History  Problem Relation Age of Onset  . Breast cancer    . Uterine cancer Mother     History  Substance Use Topics  . Smoking status: Former Smoker -- 1.0 packs/day for 20  years    Types: Cigarettes    Quit date: 09/20/1998  . Smokeless tobacco: Never Used   Comment: 1ppd x 20 years  . Alcohol Use: Yes     rare    OB History    Grav Para Term Preterm Abortions TAB SAB Ect Mult Living                  Review of Systems  Respiratory: Negative for choking and shortness of breath.   Cardiovascular: Negative for chest pain.  Gastrointestinal: Negative for nausea and vomiting.  Neurological: Positive for numbness. Negative for dizziness, facial asymmetry, speech difficulty, weakness and headaches.  All other systems reviewed and are negative.    Allergies  Codeine and Lisinopril  Home Medications   Current Outpatient Rx  Name Route Sig Dispense Refill  . ALBUTEROL SULFATE HFA 108 (90 BASE) MCG/ACT IN AERS Inhalation Inhale 2 puffs into the lungs every 6 (six) hours as needed.      . ALLOPURINOL 300 MG PO TABS Oral Take 300 mg by mouth daily.    [name] AMITRIPTYLINE HCL 100 MG PO TABS Oral Take 1 tablet (100 mg total) by mouth at bedtime. 90 tablet 1  . AMLODIPINE BESYLATE 5 MG PO TABS Oral  Take 1 tablet (5 mg total) by mouth daily. 30 tablet 1  . AMOXICILLIN-POT CLAVULANATE 875-125 MG PO TABS Oral Take 1 tablet by mouth 2 (two) times daily. 10 tablet 2  . ASPIRIN 81 MG PO TABS Oral Take 81 mg by mouth daily.      [name] VITAMIN D3 2000 UNITS PO TABS Oral Take 1 tablet by mouth daily.      . DULOXETINE HCL 30 MG PO CPEP Oral Take 30 mg by mouth daily. Take 2 at bedtime    . OMEGA-3 FATTY ACIDS 1000 MG PO CAPS Oral Take 2 g by mouth daily.      . FUROSEMIDE 40 MG PO TABS Oral Take 40 mg by mouth 2 (two) times daily.    [name] LEVOTHYROXINE SODIUM 200 MCG PO TABS Oral Take 200 mcg by mouth 2 (two) times daily.    . OXYCODONE HCL 5 MG PO TABS Oral Take 1-2 tablets (5-10 mg total) by mouth every 4 (four) hours as needed. 50 tablet 0  . POTASSIUM CHLORIDE 10 MEQ PO TBCR Oral Take 10 mEq by mouth daily.    [name] PRAVASTATIN SODIUM 40 MG PO TABS Oral Take 40 mg by mouth  daily.    [name] VITAMIN C 500 MG PO TABS Oral Take 500 mg by mouth daily.        BP 110/68  Pulse 89  Temp 98 F (36.7 C)  Resp 16  SpO2 97%  Physical Exam  Constitutional: She is oriented to person, place, and time. She appears well-developed and well-nourished.  HENT:  Head: Normocephalic and atraumatic.  Mouth/Throat: Oropharynx is clear and moist.  Eyes: Conjunctivae are normal. Pupils are equal, round, and reactive to light.  Neck: Normal range of motion. Neck supple.  Cardiovascular: Normal rate and regular rhythm.   Pulmonary/Chest: Effort normal. She has decreased breath sounds. She has no wheezes.  Abdominal: Soft. Bowel sounds are normal. There is no tenderness. There is no rebound and no guarding.  Neurological: She is alert and oriented to person, place, and time. She has normal reflexes. She displays no atrophy, no tremor and normal reflexes. No cranial nerve deficit. She displays no seizure activity. GCS eye subscore is 4. GCS verbal subscore is 5. GCS motor subscore is 6.       States that lateral right thigh has decreased sensation  Skin: Skin is warm and dry.  Psychiatric: She has a normal mood and affect.    ED Course  Procedures (including critical care time)  Labs Reviewed  CBC WITH DIFFERENTIAL - Abnormal; Notable for the following:    RBC 3.74 (*)     Hemoglobin 10.7 (*)     HCT 34.2 (*)     All other components within normal limits  POCT I-STAT, CHEM 8 - Abnormal; Notable for the following:    Hemoglobin 11.6 (*)     HCT 34.0 (*)     All other components within normal limits  URINALYSIS, ROUTINE W REFLEX MICROSCOPIC  PRO B NATRIURETIC PEPTIDE  POCT I-STAT TROPONIN I   Dg Chest 2 View  04/01/2012  *RADIOLOGY REPORT*  Clinical Data: Shortness of breath  CHEST - 2 VIEW  Comparison: 03/27/2012  Findings: Cardiomegaly.  Central vascular congestion.  Mild vascular cephalization.  No definite pleural effusion.  No pneumothorax.  Mild interstitial prominence.   No acute osseous finding.  Cervical fusion hardware partially imaged.  IMPRESSION: Cardiomegaly with central vascular congestion and suggestion of mild edema pattern.  Original  Report Authenticated By: Suanne Marker, M.D.   Ct Head Wo Contrast  04/01/2012  *RADIOLOGY REPORT*  Clinical Data: Right leg pain.  Rule out stroke.  Some numbness right hip extending to the knee.  CT HEAD WITHOUT CONTRAST  Technique:  Contiguous axial images were obtained from the base of the skull through the vertex without contrast.  Comparison: 03/08/2012.  Findings: No intracranial hemorrhage.  No CT evidence of large acute infarct.  No hydrocephalus.  No intracranial mass lesion detected on this unenhanced exam.  Exophthalmos.  Visualized sinuses and mastoid air cells clear.  Sella may be partially empty.  IMPRESSION: No CT evidence of large acute infarct.  Original Report Authenticated By: Doug Sou, M.D.     1. CHF (congestive heart failure)   2. Numbness       MDM  Symptoms not consistent with stroke nor TIA as is one nerve districution.  Minimal Numbness of the lower extremity in one nerve root distribution with a h/o of spinal narrowing on CT.  Seen by hospitalist about vascular congestion, see note by Dr. Marin Comment.  Will give IV lasix.  Will obtain doppler in am to exclude DVT.  Patient will need to follow up with her PMD on Monday for radiculopathy work up.  Will sign out pending doppler if positive will need admission if negative will need outpatient radiculopathy work up      Date: 04/01/2012  Rate: 71  Rhythm: normal sinus rhythm  QRS Axis: normal  Intervals: normal  ST/T Wave abnormalities: normal  Conduction Disutrbances: none  Narrative Interpretation: unremarkable         Liesa Tsan K Zelpha Messing-Rasch, MD 04/01/12 [phone]

## 2012-04-01 NOTE — ED Notes (Signed)
Dr Marin Comment at bedside

## 2012-04-01 NOTE — ED Notes (Signed)
Pt alert, arrives from home, c/o right leg pain, onset today, recently d/c from hospital, recent sx, resp even unlabored, skin pwd

## 2012-04-01 NOTE — ED Notes (Signed)
Pt reports numbness from right thigh to knee--pt with no unilateral swelling, no breathing changes, no pain or redness noted on assessment. Pt had recent appendectomy and reports that she was lying in bed a lot more than normal. Pt has past history of lumbar spine problem. Pt with no pain and in no apparent distress.

## 2012-04-01 NOTE — ED Provider Notes (Addendum)
Patient complains of numbness in right lateral thigh going to her knee onset yesterday. Patient reports long-standing history of L 45 protruding disc Not made better or worse by anything. On exam she is in no distress lungs clear to auscultation heart regular rate and rhythm abdomen obese nontender back without point tenderness or flank tenderness all 4 extremities without redness swelling or tenderness neurovascularly intact DP pulses are 2+  Medical decision making symptoms consistent with lumbar radiculopathy. I do not feel clinically the patient is in congestive heart failure she has no orthopnea no shortness of breath lungs clear to auscultation normal respiratory rate normal pulse ox. No further intervention is needed  Results for orders placed during the hospital encounter of 04/01/12  CBC WITH DIFFERENTIAL      Component Value Range   WBC 6.8  4.0 - 10.5 K/uL   RBC 3.74 (*) 3.87 - 5.11 MIL/uL   Hemoglobin 10.7 (*) 12.0 - 15.0 g/dL   HCT 34.2 (*) 36.0 - 46.0 %   MCV 91.4  78.0 - 100.0 fL   MCH 28.6  26.0 - 34.0 pg   MCHC 31.3  30.0 - 36.0 g/dL   RDW 13.8  11.5 - 15.5 %   Platelets 224  150 - 400 K/uL   Neutrophils Relative 54  43 - 77 %   Neutro Abs 3.7  1.7 - 7.7 K/uL   Lymphocytes Relative 33  12 - 46 %   Lymphs Abs 2.2  0.7 - 4.0 K/uL   Monocytes Relative 8  3 - 12 %   Monocytes Absolute 0.5  0.1 - 1.0 K/uL   Eosinophils Relative 5  0 - 5 %   Eosinophils Absolute 0.4  0.0 - 0.7 K/uL   Basophils Relative 0  0 - 1 %   Basophils Absolute 0.0  0.0 - 0.1 K/uL  URINALYSIS, ROUTINE W REFLEX MICROSCOPIC      Component Value Range   Color, Urine YELLOW  YELLOW   APPearance CLEAR  CLEAR   Specific Gravity, Urine 1.024  1.005 - 1.030   pH 6.0  5.0 - 8.0   Glucose, UA NEGATIVE  NEGATIVE mg/dL   Hgb urine dipstick NEGATIVE  NEGATIVE   Bilirubin Urine NEGATIVE  NEGATIVE   Ketones, ur NEGATIVE  NEGATIVE mg/dL   Protein, ur NEGATIVE  NEGATIVE mg/dL   Urobilinogen, UA 0.2  0.0 - 1.0  mg/dL   Nitrite NEGATIVE  NEGATIVE   Leukocytes, UA NEGATIVE  NEGATIVE  POCT I-STAT, CHEM 8      Component Value Range   Sodium 141  135 - 145 mEq/L   Potassium 3.7  3.5 - 5.1 mEq/L   Chloride 100  96 - 112 mEq/L   BUN 16  6 - 23 mg/dL   Creatinine, Ser 0.80  0.50 - 1.10 mg/dL   Glucose, Bld 90  70 - 99 mg/dL   Calcium, Ion 1.21  1.13 - 1.30 mmol/L   TCO2 31  0 - 100 mmol/L   Hemoglobin 11.6 (*) 12.0 - 15.0 g/dL   HCT 34.0 (*) 36.0 - 46.0 %  PRO B NATRIURETIC PEPTIDE      Component Value Range   Pro B Natriuretic peptide (BNP) 85.8  0 - 125 pg/mL  POCT I-STAT TROPONIN I      Component Value Range   Troponin i, poc 0.00  0.00 - 0.08 ng/mL   Comment 3            Dg Chest 2  View  04/01/2012  *RADIOLOGY REPORT*  Clinical Data: Shortness of breath  CHEST - 2 VIEW  Comparison: 03/27/2012  Findings: Cardiomegaly.  Central vascular congestion.  Mild vascular cephalization.  No definite pleural effusion.  No pneumothorax.  Mild interstitial prominence.  No acute osseous finding.  Cervical fusion hardware partially imaged.  IMPRESSION: Cardiomegaly with central vascular congestion and suggestion of mild edema pattern.  Original Report Authenticated By: Suanne Marker, M.D.   Dg Chest 2 View  03/27/2012  *RADIOLOGY REPORT*  Clinical Data: Shortness of breath and chest pain.  CHEST - 2 VIEW  Comparison: 03/08/2012.  Findings: Trachea is midline.  Heart size stable.  Probable mild diffuse interstitial prominence and indistinctness.  No definite pleural fluid.  IMPRESSION: Suspect mild pulmonary edema.  Original Report Authenticated By: Luretha Rued, M.D.   Dg Chest 2 View  03/08/2012  *RADIOLOGY REPORT*  Clinical Data: Syncope, history hypertension, asthma, COPD, breast cancer  CHEST - 2 VIEW  Comparison: 05/11/2011  Findings: Enlargement of cardiac silhouette. Tortuous aorta. Slight pulmonary vascular congestion. No acute failure or consolidation. No pleural effusion or pneumothorax. Prior  cervical spine fusion. Bilateral glenohumeral degenerative changes.  IMPRESSION: Enlargement of cardiac silhouette with pulmonary vascular congestion. No acute abnormalities.  Original Report Authenticated By: Burnetta Sabin, M.D.   Dg Lumbar Spine Complete  04/01/2012  *RADIOLOGY REPORT*  Clinical Data: Right leg pain and numbness.  LUMBAR SPINE - COMPLETE 4+ VIEW  Comparison: 03/28/2012 CT the  Findings: Contrast within the colon in keeping with residual from recent CT scan.  There is grade 1 anterolisthesis of L4 on L5, similar to prior.  Mild multilevel degenerative change with anterior osteophyte formation most pronounced at L2-3.  Multilevel facet arthropathy. The sacrum is incompletely evaluated/imaged. When correlated with the recent CT scan, there is severe multilevel central canal narrowing in part secondary to congenital stenosis and in part secondary to multilevel disc bulges.  IMPRESSION: Multilevel degenerative changes.  Grade 1 anterolisthesis of L4 on L5.  No acute osseous finding.  Recent CT demonstrates severe multilevel central canal narrowing of the lower lumbar spine.  Original Report Authenticated By: Suanne Marker, M.D.   Ct Head Wo Contrast  04/01/2012  *RADIOLOGY REPORT*  Clinical Data: Right leg pain.  Rule out stroke.  Some numbness right hip extending to the knee.  CT HEAD WITHOUT CONTRAST  Technique:  Contiguous axial images were obtained from the base of the skull through the vertex without contrast.  Comparison: 03/08/2012.  Findings: No intracranial hemorrhage.  No CT evidence of large acute infarct.  No hydrocephalus.  No intracranial mass lesion detected on this unenhanced exam.  Exophthalmos.  Visualized sinuses and mastoid air cells clear.  Sella may be partially empty.  IMPRESSION: No CT evidence of large acute infarct.  Original Report Authenticated By: Doug Sou, M.D.   Ct Head Wo Contrast  03/08/2012  *RADIOLOGY REPORT*  Clinical Data: Fall yesterday with  hematoma and abrasion to the right frontal scalp.  Weakness.  Loss of consciousness.  CT HEAD WITHOUT CONTRAST  Technique:  Contiguous axial images were obtained from the base of the skull through the vertex without contrast.  Comparison: CT head without contrast 02/16/2010.  Findings: No acute cortical infarct, hemorrhage, or mass lesion is present.  The ventricles are normal size.  No significant extra- axial fluid collection is present.  Minimal soft tissue swelling is present in the right paramidline frontal scalp.  There is no underlying fracture.  No  additional soft tissue injury is present.  The paranasal sinuses and mastoid air cells are clear.  IMPRESSION:  1.  Negative CT appearance of the brain. 2.  No acute fracture. 3.  Minimal soft tissue swelling in the right paramidline scalp without underlying fracture.  Original Report Authenticated By: Resa Miner. MATTERN, M.D.   Ct Abdomen Pelvis W Contrast  03/28/2012  *RADIOLOGY REPORT*  Clinical Data: Diffuse abdominal pain.  Fever.  Elevated liver function tests.  Breast carcinoma.  CT ABDOMEN AND PELVIS WITH CONTRAST  Technique:  Multidetector CT imaging of the abdomen and pelvis was performed following the standard protocol during bolus administration of intravenous contrast.  Contrast: 158m OMNIPAQUE IOHEXOL 300 MG/ML  SOLN  Comparison: None.  Findings: The liver, gallbladder, spleen, pancreas, adrenal glands, and right kidney are normal appearance.  A small left renal cyst is seen, however there is no evidence of renal mass or hydronephrosis. No other soft tissue masses or lymphadenopathy identified within the abdomen.  A left uterine fibroid is seen which measures approximately 7 cm. Shotty bilateral inguinal lymph nodes are seen, but no definite pathologically enlarged nodes are identified within the pelvis.  The appendix and base of the cecum show wall thickening, and there is moderate periappendiceal inflammatory change, consistent with acute  appendicitis.  There is no evidence of abscess or free fluid. No evidence of bowel obstruction.  IMPRESSION:  1.  Positive for acute appendicitis.  No evidence of abscess or other complication. 2.  Large uterine fibroid measuring approximately 7 cm.  These results will be called to the ordering clinician or representative by the Radiologist Assistant, and communication documented in the PACS Dashboard.  Original Report Authenticated By: JMarlaine Hind M.D.    SOrlie Dakin MD 04/01/12 0[place] MD 04/01/12 [phone]

## 2012-04-10 ENCOUNTER — Encounter: Payer: Self-pay | Admitting: Cardiology

## 2012-04-10 ENCOUNTER — Ambulatory Visit (INDEPENDENT_AMBULATORY_CARE_PROVIDER_SITE_OTHER): Payer: Medicare Other | Admitting: Cardiology

## 2012-04-10 VITALS — BP 118/66 | HR 89 | Ht 65.0 in | Wt 345.0 lb

## 2012-04-10 DIAGNOSIS — R001 Bradycardia, unspecified: Secondary | ICD-10-CM

## 2012-04-10 DIAGNOSIS — I498 Other specified cardiac arrhythmias: Secondary | ICD-10-CM | POA: Diagnosis not present

## 2012-04-10 DIAGNOSIS — I2510 Atherosclerotic heart disease of native coronary artery without angina pectoris: Secondary | ICD-10-CM | POA: Diagnosis not present

## 2012-04-10 DIAGNOSIS — G4730 Sleep apnea, unspecified: Secondary | ICD-10-CM | POA: Diagnosis not present

## 2012-04-10 DIAGNOSIS — R55 Syncope and collapse: Secondary | ICD-10-CM

## 2012-04-10 DIAGNOSIS — R609 Edema, unspecified: Secondary | ICD-10-CM

## 2012-04-10 LAB — CBC WITH DIFFERENTIAL/PLATELET
Eosinophils Absolute: 0.2 10*3/uL (ref 0.0–0.7)
Lymphs Abs: 1.3 10*3/uL (ref 0.7–4.0)
MCHC: 32.1 g/dL (ref 30.0–36.0)
MCV: 89.4 fl (ref 78.0–100.0)
Monocytes Absolute: 0.5 10*3/uL (ref 0.1–1.0)
Neutrophils Relative %: 64.6 % (ref 43.0–77.0)
Platelets: 237 10*3/uL (ref 150.0–400.0)

## 2012-04-10 LAB — BASIC METABOLIC PANEL
BUN: 9 mg/dL (ref 6–23)
CO2: 33 mEq/L — ABNORMAL HIGH (ref 19–32)
Chloride: 96 mEq/L (ref 96–112)
Creatinine, Ser: 0.8 mg/dL (ref 0.4–1.2)
Glucose, Bld: 99 mg/dL (ref 70–99)

## 2012-04-10 NOTE — Patient Instructions (Signed)
You have been referred to Dr Rayann Heman (Electrophysiologist) for discussion of pacemaker.   Your physician has recommended you make the following change in your medication: DECREASE Lasix (Furosemide) to once a day  Your physician recommends that you have lab work today: BMP and CBC  Your physician recommends that you schedule a follow-up appointment in: [place] with Dr Lia Foyer

## 2012-04-10 NOTE — Progress Notes (Signed)
HPI:  The patient is seen today in a followup visit.   The patient was admitted to the hospital with an episode of syncope. In addition to that, she is also subsequently had to have an appendectomy. When she had syncope, she was noted in the hospital to have an 8 second pauses. She had clear-cut syncope at that time. She felt like this was a panic attack. She was on beta blockers at that time, and they were subsequently discontinued. She's had an event monitor in the interim. On an event monitor she has some lightheadedness and dizziness, and during that period of time she did have moderate bradycardia. This occurred 1118 in the morning. She also had what looked like possibly escape beats with similar QRS morphology, and what likely represents a junctional rhyhtm at a rate of 45-50 bpm.;  the patient has not been driving since that time, and I told her in my opinion that she probably should not be driving.  When she was seen in the hospital, below are the recommendations of Dr. Caryl Comes:  The patient presents with the acute onset of recurrent syncope. These episodes are associated with documented sinus slowing and sinus arrest. Concurrent with these events has been the use of medroxyprogesterone and these occur in the setting of long standing beta blocker therapy. The sinus slowing prior to sinus arrest suggests a mechanism of hyper vagotonia as opposed to a primary arrhythmic issue. The implications of this then our trying to identify a trigger of the hyper vagotonia. Sinus node dysfunction and syncope are rarely associated with medroxyprogesterone. The drug apparently has some autonomic effects although it apparently enhances heart rate variability which well and for increased vagal tone does not seem to be a likely surrogate as a trigger for hypervagotonic episode  2 episodes in the hospital have occurred within 2 minutes or so of post micturition or arising from the commode. I do not think that the  dysfunctional bleeding should be serving as a trigger with relatively well preserved hemoglobin as would not be associated with viscous dilatation.  If were not able to identify a reversible trigger there are data suggesting that pacing particularly using the [employer] CLS system may help obviate recurrent symptoms. This would be later referred.  For now I would then  #1 begin to down titrate her beta blocker  #2 discontinue her Doxy progesterone and consider alternative therapies for her dose functional bleeding  #3 observe in hospital for another 48-72 hours as these episodes are clearly ominous with prolonged pausing and she is at risk for hurting herself with falls  #4 anticipate at discharge the use of an event recorder  Virl Axe, MD  03/10/2012 5:02 PM  Since that time, she has had some episodes of what seemed like more interrupted panic attacks, perhaps 4 or 5 since she was discharge from the hospital.  She has not had frank syncope.  We did review her monitor today, and one of these was recorded as noted above.  She denies any chest pain whatsoever.  She does note that she uses CPAP religiously.  I last saw her in 2011.  She has had a prior cath at [facility] in 2007 at which time she had a 75% RCA stenosis, and other non obstructive disease per my prior note.  Her cath data is not available in EPIC but I did find it in [employer] with an additional twenty minutes of work.  There was a 75% stenosis of the proximal  RCA noted, cath done by Dr. Raechel Ache.  There was otherwise insignificant CAD.  She denies any chest pain.   In the hospital, an echo was done as follows:   Study Conclusions  - Left ventricle: The cavity size was normal. Systolic function was normal. The estimated ejection fraction was in the range of 55% to 60%. Wall motion was normal; there were no regional wall motion abnormalities. - Aortic valve: Mild regurgitation. - Mitral valve: Calcified annulus. Mildly thickened  leaflets . - Left atrium: The atrium was moderately dilated. - Pulmonary arteries: Systolic pressure was mildly increased.  She also had a venous doppler done:  Summary:  - No evidence of deep vein or superficial thrombosis involving the right lower extremity and left common femoral vein. - No evidence of Baker's cyst on the right. Other specific details can be found in the table(s) above. Prepared and Electronically Authenticated by  Gae Gallop 2013-07-14T09:06:[id number]   Current Outpatient Prescriptions  Medication Sig Dispense Refill  . albuterol (PROAIR HFA) 108 (90 BASE) MCG/ACT inhaler Inhale 2 puffs into the lungs every 6 (six) hours as needed.        [name] allopurinol (ZYLOPRIM) 300 MG tablet Take 300 mg by mouth daily.      [name] amitriptyline (ELAVIL) 100 MG tablet Take 1 tablet (100 mg total) by mouth at bedtime.  90 tablet  1  . amLODipine (NORVASC) 5 MG tablet Take 1 tablet (5 mg total) by mouth daily.  30 tablet  1  . amoxicillin-clavulanate (AUGMENTIN) 875-125 MG per tablet Take 1 tablet by mouth 2 (two) times daily.      [name] aspirin 81 MG tablet Take 81 mg by mouth daily.        . Cholecalciferol (VITAMIN D3) 2000 UNITS TABS Take 1 tablet by mouth daily.        . DULoxetine (CYMBALTA) 30 MG capsule Take 30 mg by mouth daily. Take 2 at bedtime      . fish oil-omega-3 fatty acids 1000 MG capsule Take 2 g by mouth daily.        . furosemide (LASIX) 40 MG tablet Take 40 mg by mouth 2 (two) times daily.      [name] levothyroxine (SYNTHROID, LEVOTHROID) 200 MCG tablet Take 200 mcg by mouth 2 (two) times daily.      [name] oxyCODONE (OXY IR/ROXICODONE) 5 MG immediate release tablet Take 5-10 mg by mouth every 4 (four) hours as needed.      . potassium chloride (KLOR-CON) 10 MEQ CR tablet Take 10 mEq by mouth daily.      . pravastatin (PRAVACHOL) 40 MG tablet Take 40 mg by mouth daily.      . vitamin C (ASCORBIC ACID) 500 MG tablet Take 500 mg by mouth daily.          Allergies   Allergen Reactions  . Codeine     REACTION: unknown - pt. states unaware of having reaction to codeine  . Lisinopril     REACTION: cough    Past Medical History  Diagnosis Date  . Other and unspecified hyperlipidemia   . Unspecified essential hypertension   . Anemia   . CAD (coronary artery disease)     a. Nonobstructive CAD 2007 (EF 75%.  RCA  proximal 75% tubular, 25% prox LAD, 25% d1, 50% D2) at [facility]. b. NSTEMI 01/2010 in setting of respiratory failure, medical approach (not a good candidate for noninvasive eval)  . OSA on CPAP   . Hypothyroidism   .  Peripheral edema 06/13/2011  . Asthma 06/13/2011  . Depression 06/13/2011  . Neuropathy 06/13/2011  . Cervical radiculopathy 06/13/2011  . Gout 06/13/2011  . Obesity hypoventilation syndrome 06/13/2011  . COPD (chronic obstructive pulmonary disease)   . Breast cancer dx'd 2005    left  . Cellulitis     hx of cellulitis in left leg    Past Surgical History  Procedure Date  . Ovarian cyst removal   . Breast lumpectomy   . Cervical disc surgery     have cadaver bones,, screws, and plates   . Cervical biopsy June 2013    at [facility] for Heavy  Bleeding  . Dilation and curettage of uterus   . Colonoscopy   . Laparoscopic appendectomy 03/29/2012    Procedure: APPENDECTOMY LAPAROSCOPIC;  Surgeon: Adin Hector, MD;  Location: [facility];  Service: General;  Laterality: N/A;    Family History  Problem Relation Age of Onset  . Breast cancer    . Uterine cancer Mother     History   Social History  . Marital Status: Divorced    Spouse Name: N/A    Number of Children: N/A  . Years of Education: N/A   Occupational History  . Not on file.   Social History Main Topics  . Smoking status: Former Smoker -- 1.0 packs/day for 20 years    Types: Cigarettes    Quit date: 09/20/1998  . Smokeless tobacco: Never Used   Comment: 1ppd x 20 years  . Alcohol Use: Yes     rare  . Drug Use: No  . Sexually Active: Not Currently    Birth  Control/ Protection: Post-menopausal   Other Topics Concern  . Not on file   Social History Narrative  . No narrative on file    ROS: Please see the HPI.  All other systems reviewed and negative.  PHYSICAL EXAM:  BP 118/66  Pulse 89  Ht 5' 5"  (1.651 m)  Wt 345 lb (156.491 kg)  BMI 57.41 kg/m2  General: Well developed, overweight  Female , in no acute distress. Head:  Normocephalic and atraumatic. Neck: no JVD Lungs: Clear to auscultation and percussion. Heart: Normal S1 and S2.  No murmur, rubs or gallops.  Abdomen:  Normal bowel sounds; soft; non tender; no organomegaly Pulses: Pulses normal in all 4 extremities. Extremities: No clubbing or cyanosis. Resolving cellulitis of the left leg, with swelling about 1-2 plus.  Chronic venous stasis changes  (has prior admission to [facility] for this as well)   Neurologic: Alert and oriented x 3.  EKG:  NSR.  WNL.  ASSESSMENT AND PLAN:

## 2012-04-14 ENCOUNTER — Encounter: Payer: Self-pay | Admitting: Internal Medicine

## 2012-04-14 ENCOUNTER — Ambulatory Visit (INDEPENDENT_AMBULATORY_CARE_PROVIDER_SITE_OTHER): Payer: Medicare Other | Admitting: Internal Medicine

## 2012-04-14 VITALS — BP 110/80 | HR 103 | Temp 98.1°F | Ht 65.0 in | Wt 345.3 lb

## 2012-04-14 DIAGNOSIS — R05 Cough: Secondary | ICD-10-CM

## 2012-04-14 DIAGNOSIS — I10 Essential (primary) hypertension: Secondary | ICD-10-CM

## 2012-04-14 DIAGNOSIS — R6889 Other general symptoms and signs: Secondary | ICD-10-CM | POA: Diagnosis not present

## 2012-04-14 DIAGNOSIS — R7989 Other specified abnormal findings of blood chemistry: Secondary | ICD-10-CM

## 2012-04-14 MED ORDER — AMLODIPINE BESYLATE 2.5 MG PO TABS: 2.5000 mg | ORAL_TABLET | Freq: Every day | ORAL | Status: DC

## 2012-04-14 NOTE — Patient Instructions (Addendum)
OK to take the Norvasc (amlodipine) at 2.5 mg per day Continue all other medications as before Please return in 2 weeks to re-address the thyroid

## 2012-04-15 ENCOUNTER — Encounter: Payer: Self-pay | Admitting: Internal Medicine

## 2012-04-15 DIAGNOSIS — R7989 Other specified abnormal findings of blood chemistry: Secondary | ICD-10-CM | POA: Insufficient documentation

## 2012-04-15 DIAGNOSIS — R05 Cough: Secondary | ICD-10-CM | POA: Insufficient documentation

## 2012-04-15 NOTE — Assessment & Plan Note (Signed)
?   Recent sick euthyroid - cont med as is, f/u tsh next visit

## 2012-04-15 NOTE — Assessment & Plan Note (Signed)
Exam benign, afeb, unclear etiolgoy but unlikely related to norvasc;  Pt reassured,  to f/u any worsening symptoms or concerns

## 2012-04-15 NOTE — Assessment & Plan Note (Signed)
Has been off norvasc 5 days, BP noted today, pt worried may go lower with re-starting med, just had recent syncope, ok to decrease the norvasc to 2.5 qd,  to f/u any worsening symptoms or concerns

## 2012-04-15 NOTE — Progress Notes (Signed)
Subjective:    Patient ID: [name] [name], female    DOB: 11-Jun-1946, 66 y.o.   MRN: [id number]  HPI  Here to f/u, c/o cough x 1-2 wks, ? Due to norvasc to she stopped it since a friend had to stop the BP med for cough.  Cough somewhat better per pt,  Non prod, no fever, HA, ST, and Pt denies chest pain, increased sob or doe, wheezing, orthopnea, PND, increased LE swelling, palpitations, dizziness or syncope.  Pt denies new neurological symptoms such as new headache, or facial or extremity weakness or numbness   Pt denies polydipsia, polyuria.  Has ongoing post-menop bleeding/anemia on progesterone tx but unfort with symptomatic rhythm pause requiring stopping progesterone and BB.  No further pauses.  S/p appy July 10 as well.  Did also have abnormal TSH 2 wks ago at 0.57.  Also with ongoing right lumbar radiculitis type pain with numb/pain to lateral right thigh.   Past Medical History  Diagnosis Date  . Other and unspecified hyperlipidemia   . Unspecified essential hypertension   . Anemia   . CAD (coronary artery disease)     a. Nonobstructive CAD 2007 (EF 75%.  RCA  proximal 75% tubular, 25% prox LAD, 25% d1, 50% D2) at [facility]. b. NSTEMI 01/2010 in setting of respiratory failure, medical approach (not a good candidate for noninvasive eval)  . OSA on CPAP   . Hypothyroidism   . Peripheral edema 06/13/2011  . Asthma 06/13/2011  . Depression 06/13/2011  . Neuropathy 06/13/2011  . Cervical radiculopathy 06/13/2011  . Gout 06/13/2011  . Obesity hypoventilation syndrome 06/13/2011  . COPD (chronic obstructive pulmonary disease)   . Breast cancer dx'd 2005    left  . Cellulitis     hx of cellulitis in left leg   Past Surgical History  Procedure Date  . Ovarian cyst removal   . Breast lumpectomy   . Cervical disc surgery     have cadaver bones,, screws, and plates   . Cervical biopsy June 2013    at [facility] for Heavy  Bleeding  . Dilation and curettage of uterus   . Colonoscopy   . Laparoscopic  appendectomy 03/29/2012    Procedure: APPENDECTOMY LAPAROSCOPIC;  Surgeon: Adin Hector, MD;  Location: [facility];  Service: General;  Laterality: N/A;    reports that she quit smoking about 13 years ago. Her smoking use included Cigarettes. She has a 20 pack-year smoking history. She has never used smokeless tobacco. She reports that she drinks alcohol. She reports that she does not use illicit drugs. family history includes Breast cancer in an unspecified family member and Uterine cancer in her mother. Allergies  Allergen Reactions  . Codeine     REACTION: unknown - pt. states unaware of having reaction to codeine  . Lisinopril     REACTION: cough   Current Outpatient Prescriptions on File Prior to Visit  Medication Sig Dispense Refill  . albuterol (PROAIR HFA) 108 (90 BASE) MCG/ACT inhaler Inhale 2 puffs into the lungs every 6 (six) hours as needed.        [name] allopurinol (ZYLOPRIM) 300 MG tablet Take 300 mg by mouth daily.      [name] amitriptyline (ELAVIL) 100 MG tablet Take 1 tablet (100 mg total) by mouth at bedtime.  90 tablet  1  . aspirin 81 MG tablet Take 81 mg by mouth daily.        . Cholecalciferol (VITAMIN D3) 2000 UNITS TABS Take 1 tablet  by mouth daily.        . DULoxetine (CYMBALTA) 30 MG capsule Take 30 mg by mouth daily. Take 2 at bedtime      . fish oil-omega-3 fatty acids 1000 MG capsule Take 2 g by mouth daily.        . furosemide (LASIX) 40 MG tablet Take 1 tablet (40 mg total) by mouth daily.  1 tablet    . levothyroxine (SYNTHROID, LEVOTHROID) 200 MCG tablet Take 200 mcg by mouth 2 (two) times daily.      [name] oxyCODONE (OXY IR/ROXICODONE) 5 MG immediate release tablet Take 5-10 mg by mouth every 4 (four) hours as needed.      . potassium chloride (KLOR-CON) 10 MEQ CR tablet Take 10 mEq by mouth daily.      . pravastatin (PRAVACHOL) 40 MG tablet Take 40 mg by mouth daily.      [name] amoxicillin-clavulanate (AUGMENTIN) 875-125 MG per tablet Take 1 tablet by mouth 2 (two) times  daily.      . vitamin C (ASCORBIC ACID) 500 MG tablet Take 500 mg by mouth daily.         Review of Systems Review of Systems  Constitutional: Negative for diaphoresis and unexpected weight change.  HENT: Negative for tinnitus.   Eyes: Negative for photophobia and visual disturbance.  Respiratory: Negative for choking and stridor.   Gastrointestinal: Negative for vomiting and blood in stool.  Genitourinary: Negative for hematuria and decreased urine volume.  Musculoskeletal: Negative for gait problem.  Skin: Negative for color change and wound.  Neurological: Negative for tremors.    Objective:   Physical Exam BP 110/80  Pulse 103  Temp 98.1 F (36.7 C) (Oral)  Ht 5' 5"  (1.651 m)  Wt 345 lb 5 oz (156.633 kg)  BMI 57.46 kg/m2  SpO2 97% Physical Exam  VS noted Constitutional: Pt appears well-developed and well-nourished. Annabell Sabal HENT: Head: Normocephalic.  Right Ear: External ear normal.  Left Ear: External ear normal.  Eyes: Conjunctivae and EOM are normal. Pupils are equal, round, and reactive to light.  Neck: Normal range of motion. Neck supple.  Cardiovascular: Normal rate and regular rhythm.   Pulmonary/Chest: Effort normal and breath sounds normal.  Abd:  Soft, NT, non-distended, + BS Neurological: Pt is alert. Not confused, has decreased sens to right lateral thigh.  Skin: Skin is warm. No erythema. No rash Psychiatric: Pt behavior is normal. Thought content normal. 1+ nervous    Assessment & Plan:

## 2012-04-16 NOTE — Assessment & Plan Note (Signed)
See extensive notes.  Still has some "panic attacks", and one wonders if they could be related.  I think it based, based on note of SK, that EP see her, especially in light of continuing symptoms.  If in fact pacing is thought appropriate, one consideration might be cath just prior to exclude RCA disease.  Will get EP opinion.

## 2012-04-16 NOTE — Assessment & Plan Note (Signed)
She has no current symptoms.  She is not having chest pain.  It is possible that brady events could be related to an RCA issue.  However, they are better off of beta blockade.  Will refer to EP for further discussion of appropriate care and see back in follow up.

## 2012-04-16 NOTE — Assessment & Plan Note (Signed)
She uses CPAP religiously.

## 2012-04-16 NOTE — Assessment & Plan Note (Signed)
Appears stable 

## 2012-04-18 ENCOUNTER — Other Ambulatory Visit: Payer: Self-pay | Admitting: Oncology

## 2012-04-18 DIAGNOSIS — Z1231 Encounter for screening mammogram for malignant neoplasm of breast: Secondary | ICD-10-CM

## 2012-04-18 DIAGNOSIS — Z853 Personal history of malignant neoplasm of breast: Secondary | ICD-10-CM

## 2012-04-19 ENCOUNTER — Ambulatory Visit (INDEPENDENT_AMBULATORY_CARE_PROVIDER_SITE_OTHER): Payer: Medicare Other | Admitting: Surgery

## 2012-04-19 ENCOUNTER — Encounter (INDEPENDENT_AMBULATORY_CARE_PROVIDER_SITE_OTHER): Payer: Self-pay | Admitting: Surgery

## 2012-04-19 VITALS — BP 132/64 | HR 60 | Temp 97.3°F | Resp 12 | Ht 65.0 in | Wt 341.2 lb

## 2012-04-19 DIAGNOSIS — K3580 Unspecified acute appendicitis: Secondary | ICD-10-CM

## 2012-04-19 NOTE — Progress Notes (Signed)
Subjective:     Patient ID: [name] [name], female   DOB: [birth year], 66 y.o.   MRN: [id number]  HPI  [name] [name]  1945/11/02 [id number]  Patient Care Team: Biagio Borg, MD as PCP - General (Internal Medicine)  This patient is a 66 y.o.female who presents today for surgical evaluation.   Procedure: laparoscopic lysis of adhesions and appendectomy 03/29/2012  Pathology: Acute appendicitis  The patient comes in today feeling much better.  Minimal soreness.  Mild lumpiness at the port sites.  Eating well.  Moving bowels well.  Was hoping I could do her hysterectomy since the laparoscopic appendectomy went well.  Notes she has some radicular numbness around her right thigh and wondered if that was related to surgery.  Patient Active Problem List  Diagnosis  . HYPOTHYROIDISM  . HYPERLIPIDEMIA  . HYPERTENSION  . ACUT MI SUBENDOCARDIAL INFARCT SUBSQT EPIS CARE  . CORONARY ARTERY DISEASE  . C O P D  . OBSTRUCTIVE SLEEP APNEA  . OXYGEN-USE OF SUPPLEMENTAL  . Preventative health care  . Breast cancer  . Peripheral edema  . Asthma  . Depression  . Colon polyps  . Neuropathy  . Cervical radiculopathy  . Gout  . Obesity hypoventilation syndrome  . Cellulitis, leg  . Syncope  . Obesity  . Acute appendicitis  . Cough  . Abnormal TSH    Past Medical History  Diagnosis Date  . Other and unspecified hyperlipidemia   . Unspecified essential hypertension   . Anemia   . CAD (coronary artery disease)     a. Nonobstructive CAD 2007 (EF 75%.  RCA  proximal 75% tubular, 25% prox LAD, 25% d1, 50% D2) at [facility]. b. NSTEMI 01/2010 in setting of respiratory failure, medical approach (not a good candidate for noninvasive eval)  . OSA on CPAP   . Hypothyroidism   . Peripheral edema 06/13/2011  . Asthma 06/13/2011  . Depression 06/13/2011  . Neuropathy 06/13/2011  . Cervical radiculopathy 06/13/2011  . Gout 06/13/2011  . Obesity hypoventilation syndrome 06/13/2011  . COPD (chronic obstructive  pulmonary disease)   . Breast cancer dx'd 2005    left  . Cellulitis     hx of cellulitis in left leg    Past Surgical History  Procedure Date  . Ovarian cyst removal   . Breast lumpectomy   . Cervical disc surgery     have cadaver bones,, screws, and plates   . Cervical biopsy June 2013    at [facility] for Heavy  Bleeding  . Dilation and curettage of uterus   . Colonoscopy   . Laparoscopic appendectomy 03/29/2012    Procedure: APPENDECTOMY LAPAROSCOPIC;  Surgeon: Adin Hector, MD;  Location: [facility];  Service: General;  Laterality: N/A;    History   Social History  . Marital Status: Divorced    Spouse Name: N/A    Number of Children: N/A  . Years of Education: N/A   Occupational History  . Not on file.   Social History Main Topics  . Smoking status: Former Smoker -- 1.0 packs/day for 20 years    Types: Cigarettes    Quit date: 09/20/1998  . Smokeless tobacco: Never Used   Comment: 1ppd x 20 years  . Alcohol Use: Yes     rare  . Drug Use: No  . Sexually Active: Not Currently    Birth Control/ Protection: Post-menopausal   Other Topics Concern  . Not on file   Social History Narrative  . No  narrative on file    Family History  Problem Relation Age of Onset  . Breast cancer    . Uterine cancer Mother   . Cancer Mother     Theadora Rama  . Heart disease Mother   . Cancer Sister 98    breast  . Diabetes Brother   . Cancer Maternal Aunt     breast  . Cancer Cousin 40    breast    Current Outpatient Prescriptions  Medication Sig Dispense Refill  . albuterol (PROAIR HFA) 108 (90 BASE) MCG/ACT inhaler Inhale 2 puffs into the lungs every 6 (six) hours as needed.        [name] allopurinol (ZYLOPRIM) 300 MG tablet Take 300 mg by mouth daily.      [name] amitriptyline (ELAVIL) 100 MG tablet Take 1 tablet (100 mg total) by mouth at bedtime.  90 tablet  1  . amLODipine (NORVASC) 2.5 MG tablet Take 1 tablet (2.5 mg total) by mouth daily.  30 tablet  11  . aspirin 81 MG tablet  Take 81 mg by mouth daily.        . Cholecalciferol (VITAMIN D3) 2000 UNITS TABS Take 1 tablet by mouth daily.        . DULoxetine (CYMBALTA) 30 MG capsule Take 30 mg by mouth daily. Take 2 at bedtime      . fish oil-omega-3 fatty acids 1000 MG capsule Take 2 g by mouth daily.        . furosemide (LASIX) 40 MG tablet Take 1 tablet (40 mg total) by mouth daily.  1 tablet    . levothyroxine (SYNTHROID, LEVOTHROID) 200 MCG tablet Take 200 mcg by mouth 2 (two) times daily.      . potassium chloride (KLOR-CON) 10 MEQ CR tablet Take 10 mEq by mouth daily.      . pravastatin (PRAVACHOL) 40 MG tablet Take 40 mg by mouth daily.      . vitamin C (ASCORBIC ACID) 500 MG tablet Take 500 mg by mouth daily.        [name] amoxicillin-clavulanate (AUGMENTIN) 875-125 MG per tablet Take 1 tablet by mouth 2 (two) times daily.      [name] oxyCODONE (OXY IR/ROXICODONE) 5 MG immediate release tablet Take 5-10 mg by mouth every 4 (four) hours as needed.         Allergies  Allergen Reactions  . Codeine     REACTION: unknown - pt. states unaware of having reaction to codeine  . Lisinopril     REACTION: cough    BP 132/64  Pulse 60  Temp 97.3 F (36.3 C) (Temporal)  Resp 12  Ht 5' 5"  (1.651 m)  Wt 341 lb 3.2 oz (154.767 kg)  BMI 56.78 kg/m2  Dg Chest 2 View  04/01/2012  *RADIOLOGY REPORT*  Clinical Data: Shortness of breath  CHEST - 2 VIEW  Comparison: 03/27/2012  Findings: Cardiomegaly.  Central vascular congestion.  Mild vascular cephalization.  No definite pleural effusion.  No pneumothorax.  Mild interstitial prominence.  No acute osseous finding.  Cervical fusion hardware partially imaged.  IMPRESSION: Cardiomegaly with central vascular congestion and suggestion of mild edema pattern.  Original Report Authenticated By: Suanne Marker, M.D.   Dg Chest 2 View  03/27/2012  *RADIOLOGY REPORT*  Clinical Data: Shortness of breath and chest pain.  CHEST - 2 VIEW  Comparison: 03/08/2012.  Findings: Trachea is midline.   Heart size stable.  Probable mild diffuse interstitial prominence and indistinctness.  No definite pleural  fluid.  IMPRESSION: Suspect mild pulmonary edema.  Original Report Authenticated By: Luretha Rued, M.D.   Dg Lumbar Spine Complete  04/01/2012  *RADIOLOGY REPORT*  Clinical Data: Right leg pain and numbness.  LUMBAR SPINE - COMPLETE 4+ VIEW  Comparison: 03/28/2012 CT the  Findings: Contrast within the colon in keeping with residual from recent CT scan.  There is grade 1 anterolisthesis of L4 on L5, similar to prior.  Mild multilevel degenerative change with anterior osteophyte formation most pronounced at L2-3.  Multilevel facet arthropathy. The sacrum is incompletely evaluated/imaged. When correlated with the recent CT scan, there is severe multilevel central canal narrowing in part secondary to congenital stenosis and in part secondary to multilevel disc bulges.  IMPRESSION: Multilevel degenerative changes.  Grade 1 anterolisthesis of L4 on L5.  No acute osseous finding.  Recent CT demonstrates severe multilevel central canal narrowing of the lower lumbar spine.  Original Report Authenticated By: Suanne Marker, M.D.   Ct Head Wo Contrast  04/01/2012  *RADIOLOGY REPORT*  Clinical Data: Right leg pain.  Rule out stroke.  Some numbness right hip extending to the knee.  CT HEAD WITHOUT CONTRAST  Technique:  Contiguous axial images were obtained from the base of the skull through the vertex without contrast.  Comparison: 03/08/2012.  Findings: No intracranial hemorrhage.  No CT evidence of large acute infarct.  No hydrocephalus.  No intracranial mass lesion detected on this unenhanced exam.  Exophthalmos.  Visualized sinuses and mastoid air cells clear.  Sella may be partially empty.  IMPRESSION: No CT evidence of large acute infarct.  Original Report Authenticated By: Doug Sou, M.D.   Ct Abdomen Pelvis W Contrast  03/28/2012  *RADIOLOGY REPORT*  Clinical Data: Diffuse abdominal pain.   Fever.  Elevated liver function tests.  Breast carcinoma.  CT ABDOMEN AND PELVIS WITH CONTRAST  Technique:  Multidetector CT imaging of the abdomen and pelvis was performed following the standard protocol during bolus administration of intravenous contrast.  Contrast: 182m OMNIPAQUE IOHEXOL 300 MG/ML  SOLN  Comparison: None.  Findings: The liver, gallbladder, spleen, pancreas, adrenal glands, and right kidney are normal appearance.  A small left renal cyst is seen, however there is no evidence of renal mass or hydronephrosis. No other soft tissue masses or lymphadenopathy identified within the abdomen.  A left uterine fibroid is seen which measures approximately 7 cm. Shotty bilateral inguinal lymph nodes are seen, but no definite pathologically enlarged nodes are identified within the pelvis.  The appendix and base of the cecum show wall thickening, and there is moderate periappendiceal inflammatory change, consistent with acute appendicitis.  There is no evidence of abscess or free fluid. No evidence of bowel obstruction.  IMPRESSION:  1.  Positive for acute appendicitis.  No evidence of abscess or other complication. 2.  Large uterine fibroid measuring approximately 7 cm.  These results will be called to the ordering clinician or representative by the Radiologist Assistant, and communication documented in the PACS Dashboard.  Original Report Authenticated By: JMarlaine Hind M.D.     Review of Systems  Constitutional: Negative for fever, chills and diaphoresis.  HENT: Negative for ear pain, sore throat and trouble swallowing.   Eyes: Negative for photophobia and visual disturbance.  Respiratory: Negative for cough and choking.   Cardiovascular: Positive for leg swelling. Negative for chest pain and palpitations.  Gastrointestinal: Negative for nausea, vomiting, abdominal pain, diarrhea, constipation, anal bleeding and rectal pain.  Genitourinary: Negative for dysuria, frequency and difficulty  urinating.  Musculoskeletal: Negative for myalgias and gait problem.  Skin: Negative for color change, pallor and rash.  Neurological: Negative for dizziness, speech difficulty, weakness and numbness.  Hematological: Negative for adenopathy.  Psychiatric/Behavioral: Negative for confusion and agitation. The patient is not nervous/anxious.        Objective:   Physical Exam  Constitutional: She is oriented to person, place, and time. She appears well-developed and well-nourished. No distress.  HENT:  Head: Normocephalic.  Mouth/Throat: Oropharynx is clear and moist. No oropharyngeal exudate.  Eyes: Conjunctivae and EOM are normal. Pupils are equal, round, and reactive to light. No scleral icterus.  Neck: Normal range of motion. No tracheal deviation present.  Cardiovascular: Normal rate and intact distal pulses.   Pulmonary/Chest: Effort normal. No respiratory distress. She exhibits no tenderness.  Abdominal: Soft. She exhibits no distension. There is no tenderness. Hernia confirmed negative in the right inguinal area and confirmed negative in the left inguinal area.       Incisions clean with normal healing ridges.  No hernias  Genitourinary: No vaginal discharge found.  Musculoskeletal: Normal range of motion. She exhibits no tenderness.  Lymphadenopathy:       Right: No inguinal adenopathy present.       Left: No inguinal adenopathy present.  Neurological: She is alert and oriented to person, place, and time. No cranial nerve deficit. She exhibits normal muscle tone. Coordination normal.  Skin: Skin is warm and dry. No rash noted. She is not diaphoretic.  Psychiatric: She has a normal mood and affect. Her behavior is normal.       Assessment:     Recovering well from laparoscopic appendectomy.    Plan:     Increase activity as tolerated.  Do not push through pain.  Advanced on diet as tolerated. Bowel regimen to avoid problems.  I will defer to her gynecologist on whether  or not she does want a hysterectomy.  Especially since she is a morbidly obese oxygen dependent COPD  The right lateral thigh numbness is probably due due to radiculopathy at the lateral femoral cutaneous nerve.  She's had some problems with her lumbar spine.  I noted that does not improve by three months postop that she should get a more aggressive workup.  Otherwise try heat and anti-inflammatories as tolerated.  She feels reassured  Return to clinic p.r.n. The patient expressed understanding and appreciation

## 2012-04-19 NOTE — Patient Instructions (Signed)
Radicular Pain Radicular pain in either the arm or leg is usually from a bulging or herniated disk in the spine. A piece of the herniated disk may press against the nerves as the nerves exit the spine. This causes pain which is felt at the tips of the nerves down the arm or leg. Other causes of radicular pain may include:  Fractures.   Heart disease.   Cancer.   An abnormal and usually degenerative state of the nervous system or nerves (neuropathy).  Diagnosis may require CT or MRI scanning to determine the primary cause.  Nerves that start at the neck (nerve roots) may cause radicular pain in the outer shoulder and arm. It can spread down to the thumb and fingers. The symptoms vary depending on which nerve root has been affected. In most cases radicular pain improves with conservative treatment. Neck problems may require physical therapy, a neck collar, or cervical traction. Treatment may take many weeks, and surgery may be considered if the symptoms do not improve.  Conservative treatment is also recommended for sciatica. Sciatica causes pain to radiate from the lower back or buttock area down the leg into the foot. Often there is a history of back problems. Most patients with sciatica are better after 2 to 4 weeks of rest and other supportive care. Short term bed rest can reduce the disk pressure considerably. Sitting, however, is not a good position since this increases the pressure on the disk. You should avoid bending, lifting, and all other activities which make the problem worse. Traction can be used in severe cases. Surgery is usually reserved for patients who do not improve within the first months of treatment. Only take over-the-counter or prescription medicines for pain, discomfort, or fever as directed by your caregiver. Narcotics and muscle relaxants may help by relieving more severe pain and spasm and by providing mild sedation. Cold or massage can give significant relief. Spinal  manipulation is not recommended. It can increase the degree of disc protrusion. Epidural steroid injections are often effective treatment for radicular pain. These injections deliver medicine to the spinal nerve in the space between the protective covering of the spinal cord and back bones (vertebrae). Your caregiver can give you more information about steroid injections. These injections are most effective when given within two weeks of the onset of pain.  You should see your caregiver for follow up care as recommended. A program for neck and back injury rehabilitation with stretching and strengthening exercises is an important part of management.  SEEK IMMEDIATE MEDICAL CARE IF:  You develop increased pain, weakness, or numbness in your arm or leg.   You develop difficulty with bladder or bowel control.   You develop abdominal pain.  Document Released: 10/14/2004 Document Revised: 08/26/2011 Document Reviewed: 12/30/2008 [facility] Patient Information 2012 [place].

## 2012-04-27 ENCOUNTER — Encounter: Payer: Self-pay | Admitting: Pulmonary Disease

## 2012-04-27 ENCOUNTER — Ambulatory Visit (INDEPENDENT_AMBULATORY_CARE_PROVIDER_SITE_OTHER): Payer: Medicare Other | Admitting: Pulmonary Disease

## 2012-04-27 VITALS — BP 130/62 | HR 117 | Temp 98.3°F | Ht 65.0 in | Wt 339.6 lb

## 2012-04-27 DIAGNOSIS — J4489 Other specified chronic obstructive pulmonary disease: Secondary | ICD-10-CM

## 2012-04-27 DIAGNOSIS — J449 Chronic obstructive pulmonary disease, unspecified: Secondary | ICD-10-CM

## 2012-04-27 DIAGNOSIS — G4730 Sleep apnea, unspecified: Secondary | ICD-10-CM | POA: Diagnosis not present

## 2012-04-27 NOTE — Patient Instructions (Addendum)
You would qualify for the pulmonary rehab program Stay on CPAP Ambulate on oxygen

## 2012-04-27 NOTE — Progress Notes (Signed)
  Subjective:    Patient ID: [name] [name], female    DOB: 1945-12-22, 66 y.o.   MRN: [id number]  HPI PCP - Jenny Reichmann   66 year old morbidly obese female ex smoker (quit 2000) for FU of copd & OSA on 10 cm.  PMH -sleep apnea, hypertension,breast cancer with left lumpectomy and hypothyroidism   She was admitted 5/26-02/19/10 with left lower extremity pain and swelling, course complicated by acute hypercarbic resp failure requiring BiPAP after receiving dilaudid.  Duplex neg DVT, CT angio neg PE, echo >> nml LV fn, mod LVH.  She was discharged on CPAP 10 , full face mask with 2-3 L O2 ([facility]RI[facility]). She smoked a PPD x 30 yrs before quitting in 2004.  PFTs showed moderate airway obstruction FEV1 48%, no BD response, mild restriction, DLCO corrects for alveolar volume c/w obesity  PSG 03/30/10 surprisingly showed AHI 0.7/h & RDI 9/h, 36 RERAs over 249 mins TST, lowest desatn 92% on 2 L [place]  July 2011 -ONO showed significant desaturation for > 4h   02/12/2011 Annual FU for recertification of O2  spiriva caused coughing   06/18/2011  C/o episodic dyspnea lasting few mins - goes away if she calms down & takes deep breaths  Off ativan now    04/27/2012 Syncope , fall - hospitalised , 5s pauses -documented sinus slowing and sinus arrest, BB stopped seen by dr Lia Foyer & EP  Acute appendecitis CXR 7/13 mild edema patter Cough improved with prilosec C/o bipedal edema - back on diuretic Synthroid back down to 200 mcg daily Compliant with CPAP, not needing rescue inhaler, able to stay off O2 at rest Cellulitis & gout ongoing issues - uses walker  Did not desaturate on 2L O2 but HR increased to 120 s/o deconditioning     Review of Systems neg for any significant sore throat, dysphagia, itching, sneezing, nasal congestion or excess/ purulent secretions, fever, chills, sweats, unintended wt loss, pleuritic or exertional cp, hempoptysis, orthopnea pnd or change in chronic leg swelling. Also denies presyncope,  palpitations, heartburn, abdominal pain, nausea, vomiting, diarrhea or change in bowel or urinary habits, dysuria,hematuria, rash, arthralgias, visual complaints, headache, numbness weakness or ataxia.     Objective:   Physical Exam  Gen. Pleasant, obese, in no distress ENT - no lesions, no post nasal drip Neck: No JVD, no thyromegaly, no carotid bruits Lungs: no use of accessory muscles, no dullness to percussion, decreased without rales or rhonchi  Cardiovascular: Rhythm regular, heart sounds  normal, no murmurs or gallops, no peripheral edema Musculoskeletal: No deformities, no cyanosis or clubbing , no tremors       Assessment & Plan:

## 2012-04-28 ENCOUNTER — Ambulatory Visit (INDEPENDENT_AMBULATORY_CARE_PROVIDER_SITE_OTHER): Payer: Medicare Other | Admitting: Internal Medicine

## 2012-04-28 ENCOUNTER — Encounter: Payer: Self-pay | Admitting: Internal Medicine

## 2012-04-28 VITALS — BP 124/62 | HR 104 | Temp 98.3°F | Ht 65.0 in | Wt 339.2 lb

## 2012-04-28 DIAGNOSIS — IMO0002 Reserved for concepts with insufficient information to code with codable children: Secondary | ICD-10-CM

## 2012-04-28 DIAGNOSIS — R7989 Other specified abnormal findings of blood chemistry: Secondary | ICD-10-CM

## 2012-04-28 DIAGNOSIS — E039 Hypothyroidism, unspecified: Secondary | ICD-10-CM | POA: Diagnosis not present

## 2012-04-28 DIAGNOSIS — I10 Essential (primary) hypertension: Secondary | ICD-10-CM | POA: Diagnosis not present

## 2012-04-28 DIAGNOSIS — M5416 Radiculopathy, lumbar region: Secondary | ICD-10-CM

## 2012-04-28 DIAGNOSIS — R6889 Other general symptoms and signs: Secondary | ICD-10-CM

## 2012-04-28 MED ORDER — GABAPENTIN 300 MG PO CAPS: 300.0000 mg | ORAL_CAPSULE | Freq: Three times a day (TID) | ORAL | Status: DC

## 2012-04-28 NOTE — Assessment & Plan Note (Addendum)
Did not desaturate on 2L O2 but HR increased to 120 s/o deconditioning- would qualify for the pulmonary rehab program -need clearance from cards given recent brady issues She does not feel the need for LABA , did not tolerate LAMA

## 2012-04-28 NOTE — Assessment & Plan Note (Signed)
In light of her mult problems, she declines further eval such as MRI or surgury eval, but will accept gabapentin trial - 300 tid, consider increase if not effective

## 2012-04-28 NOTE — Assessment & Plan Note (Signed)
stable overall by hx and exam, most recent data reviewed with pt, and pt to continue medical treatment as before BP Readings from Last 3 Encounters:  04/28/12 124/62  04/27/12 130/62  04/19/12 132/64

## 2012-04-28 NOTE — Assessment & Plan Note (Signed)
With last hospn - ? Sick euthyroid- for f/u TSH today

## 2012-04-28 NOTE — Patient Instructions (Addendum)
Please start the gabapentin for the back and leg pain at one pill at night for 3 nights, then twice per day for 3 days, then three times per day after that to avoid sleepiness Continue all other medications as before Please go to LAB in the Basement for the blood and/or urine tests to be done today - just the thyroid today Please return in 6 months, or sooner if needed

## 2012-04-28 NOTE — Assessment & Plan Note (Signed)
Weight loss encouraged, compliance with goal of at least 4-6 hrs every night is the expectation. Advised against medications with sedative side effects Cautioned against driving when sleepy - understanding that sleepiness will vary on a day to day basis  

## 2012-04-28 NOTE — Progress Notes (Signed)
Subjective:    Patient ID: [name] [name], female    DOB: 08-01-1946, 66 y.o.   MRN: [id number]  HPI  Here to f/u;  Overall doing ok, saw pulm yest, now for pulm rehab.  Pt continues to have recurring right LBP without change in severity except for mild worsening pain/numbness to right lateral thigh in the past wk, but no bowel or bladder change, fever, wt loss,  worsening LE weakness, gait change or falls.  Pt denies chest pain, increased sob or doe, wheezing, orthopnea, PND, increased LE swelling, palpitations, dizziness or syncope.   Pt denies polydipsia, polyuria.  Pt denies new neurological symptoms such as new headache, or facial or extremity weakness or numbness  BP at home correllate well with BP here today - < 140/90. Denies hyper or hypo thyroid symptoms such as voice, skin or hair change, due for f/u TSH today Past Medical History  Diagnosis Date  . Other and unspecified hyperlipidemia   . Unspecified essential hypertension   . Anemia   . CAD (coronary artery disease)     a. Nonobstructive CAD 2007 (EF 75%.  RCA  proximal 75% tubular, 25% prox LAD, 25% d1, 50% D2) at [facility]. b. NSTEMI 01/2010 in setting of respiratory failure, medical approach (not a good candidate for noninvasive eval)  . OSA on CPAP   . Hypothyroidism   . Peripheral edema 06/13/2011  . Asthma 06/13/2011  . Depression 06/13/2011  . Neuropathy 06/13/2011  . Cervical radiculopathy 06/13/2011  . Gout 06/13/2011  . Obesity hypoventilation syndrome 06/13/2011  . COPD (chronic obstructive pulmonary disease)   . Breast cancer dx'd 2005    left  . Cellulitis     hx of cellulitis in left leg   Past Surgical History  Procedure Date  . Ovarian cyst removal   . Breast lumpectomy   . Cervical disc surgery     have cadaver bones,, screws, and plates   . Cervical biopsy June 2013    at [facility] for Heavy  Bleeding  . Dilation and curettage of uterus   . Colonoscopy   . Laparoscopic appendectomy 03/29/2012    Procedure: APPENDECTOMY  LAPAROSCOPIC;  Surgeon: Adin Hector, MD;  Location: [facility];  Service: General;  Laterality: N/A;    reports that she quit smoking about 13 years ago. Her smoking use included Cigarettes. She has a 20 pack-year smoking history. She has never used smokeless tobacco. She reports that she drinks alcohol. She reports that she does not use illicit drugs. family history includes Breast cancer in an unspecified family member; Cancer in her maternal aunt and mother; Cancer (age of onset:34) in her sister; Cancer (age of onset:40) in her cousin; Diabetes in her brother; Heart disease in her mother; and Uterine cancer in her mother. Allergies  Allergen Reactions  . Codeine     REACTION: unknown - pt. states unaware of having reaction to codeine  . Lisinopril     REACTION: cough   Current Outpatient Prescriptions on File Prior to Visit  Medication Sig Dispense Refill  . albuterol (PROAIR HFA) 108 (90 BASE) MCG/ACT inhaler Inhale 2 puffs into the lungs every 6 (six) hours as needed.        [name] allopurinol (ZYLOPRIM) 300 MG tablet Take 300 mg by mouth daily.      [name] amitriptyline (ELAVIL) 100 MG tablet Take 1 tablet (100 mg total) by mouth at bedtime.  90 tablet  1  . amLODipine (NORVASC) 2.5 MG tablet Take 1 tablet (  2.5 mg total) by mouth daily.  30 tablet  11  . aspirin 81 MG tablet Take 81 mg by mouth daily.        . Cholecalciferol (VITAMIN D3) 2000 UNITS TABS Take 1 tablet by mouth daily.        . DULoxetine (CYMBALTA) 30 MG capsule Take 30 mg by mouth daily. Take 2 at bedtime      . fish oil-omega-3 fatty acids 1000 MG capsule Take 2 g by mouth daily.        . furosemide (LASIX) 40 MG tablet Take 1 tablet (40 mg total) by mouth daily.  1 tablet    . levothyroxine (SYNTHROID, LEVOTHROID) 200 MCG tablet Take 200 mcg by mouth daily.       . potassium chloride (KLOR-CON) 10 MEQ CR tablet Take 10 mEq by mouth daily.      . pravastatin (PRAVACHOL) 40 MG tablet Take 40 mg by mouth daily.      . vitamin C  (ASCORBIC ACID) 500 MG tablet Take 500 mg by mouth daily.        [name] gabapentin (NEURONTIN) 300 MG capsule Take 1 capsule (300 mg total) by mouth 3 (three) times daily.  90 capsule  5   Review of Systems Constitutional: Negative for diaphoresis and unexpected weight change.  HENT: Negative for tinnitus.   Eyes: Negative for photophobia and visual disturbance.  Respiratory: Negative for choking and stridor.   Gastrointestinal: Negative for vomiting and blood in stool.  Genitourinary: Negative for hematuria and decreased urine volume.  Musculoskeletal: Negative for acute joint swelling Skin: Negative for color change and wound.  Neurological: Negative for tremors  Psychiatric/Behavioral: Negative for decreased concentration. The patient is not hyperactive.      Objective:   Physical Exam BP 124/62  Pulse 104  Temp 98.3 F (36.8 C) (Oral)  Ht 5' 5"  (1.651 m)  Wt 339 lb 4 oz (153.883 kg)  BMI 56.45 kg/m2  SpO2 98% Physical Exam  VS noted Constitutional: Pt appears well-developed and well-nourished.  HENT: Head: Normocephalic.  Right Ear: External ear normal.  Left Ear: External ear normal.  Eyes: Conjunctivae and EOM are normal. Pupils are equal, round, and reactive to light.  Neck: Normal range of motion. Neck supple.  Cardiovascular: Normal rate and regular rhythm.   Pulmonary/Chest: Effort normal and breath sounds normal.  Abd:  Soft, NT, non-distended, + BS Neurological: Pt is alert. Not confused, spine nontender but decr sens to LT to right lat thigh Skin: Skin is warm. No erythema. No rash Psychiatric: Pt behavior is normal. Thought content normal. mild nervous at best today, not depressed affect    Assessment & Plan:

## 2012-05-09 ENCOUNTER — Ambulatory Visit
Admission: RE | Admit: 2012-05-09 | Discharge: 2012-05-09 | Disposition: A | Discharge status: Home / Self Care | Payer: Medicare Other | Source: Ambulatory Visit | Attending: Oncology | Admitting: Oncology

## 2012-05-09 DIAGNOSIS — Z853 Personal history of malignant neoplasm of breast: Secondary | ICD-10-CM | POA: Diagnosis not present

## 2012-05-25 ENCOUNTER — Encounter: Payer: Self-pay | Admitting: Internal Medicine

## 2012-05-25 ENCOUNTER — Ambulatory Visit (INDEPENDENT_AMBULATORY_CARE_PROVIDER_SITE_OTHER): Payer: Medicare Other | Admitting: Internal Medicine

## 2012-05-25 VITALS — BP 140/78 | HR 99 | Ht 65.0 in | Wt 337.0 lb

## 2012-05-25 DIAGNOSIS — E662 Morbid (severe) obesity with alveolar hypoventilation: Secondary | ICD-10-CM | POA: Diagnosis not present

## 2012-05-25 DIAGNOSIS — R600 Localized edema: Secondary | ICD-10-CM

## 2012-05-25 DIAGNOSIS — R55 Syncope and collapse: Secondary | ICD-10-CM | POA: Diagnosis not present

## 2012-05-25 DIAGNOSIS — I495 Sick sinus syndrome: Secondary | ICD-10-CM

## 2012-05-25 DIAGNOSIS — R609 Edema, unspecified: Secondary | ICD-10-CM

## 2012-05-25 DIAGNOSIS — Z6841 Body Mass Index (BMI) 40.0 and over, adult: Secondary | ICD-10-CM

## 2012-05-25 NOTE — Assessment & Plan Note (Signed)
Doing well at this time without recent symptoms of bradycardia.  I have reviewed her event monitor from June/July 2013 which revealed no significant arrhythmias off of beta blockers and medroxyprogesterone.  She is clear that she would like to avoid PPM.  I think that given her overall clinical improvement from the standpoint of her pauses that this is reasonable.  She should avoid AV nodal agents long term. I have instructed her to not drive for an additional 4 weeks.  If she has no further presyncope/ syncope then it may be reasonable for her to drive at that point. She will contact my office immediately should she develops recurrent symptoms of presyncope/ syncope.

## 2012-05-25 NOTE — Patient Instructions (Addendum)
Your physician recommends that you schedule a follow-up appointment as needed   Wear Support hose  Weigh daily if weight up 2-3 pounds take fluid pill   2 Gram Low Sodium Diet A 2 gram sodium diet restricts the amount of sodium in the diet to no more than 2 g or 2000 mg daily. Limiting the amount of sodium is often used to help lower blood pressure. It is important if you have heart, liver, or kidney problems. Many foods contain sodium for flavor and sometimes as a preservative. When the amount of sodium in a diet needs to be low, it is important to know what to look for when choosing foods and drinks. The following includes some information and guidelines to help make it easier for you to adapt to a low sodium diet. QUICK TIPS  Do not add salt to food.   Avoid convenience items and fast food.   Choose unsalted snack foods.   Buy lower sodium products, often labeled as "lower sodium" or "no salt added."   Check food labels to learn how much sodium is in 1 serving.   When eating at a restaurant, ask that your food be prepared with less salt or none, if possible.  READING FOOD LABELS FOR SODIUM INFORMATION The nutrition facts label is a good place to find how much sodium is in foods. Look for products with no more than 500 to 600 mg of sodium per meal and no more than 150 mg per serving. Remember that 2 g = 2000 mg. The food label may also list foods as:  Sodium-free: Less than 5 mg in a serving.   Very low sodium: 35 mg or less in a serving.   Low-sodium: 140 mg or less in a serving.   Light in sodium: 50% less sodium in a serving. For example, if a food that usually has 300 mg of sodium is changed to become light in sodium, it will have 150 mg of sodium.   Reduced sodium: 25% less sodium in a serving. For example, if a food that usually has 400 mg of sodium is changed to reduced sodium, it will have 300 mg of sodium.  CHOOSING FOODS Grains  Avoid: Salted crackers and snack  items. Some cereals, including instant hot cereals. Bread stuffing and biscuit mixes. Seasoned rice or pasta mixes.   Choose: Unsalted snack items. Low-sodium cereals, oats, puffed wheat and rice, shredded wheat. English muffins and bread. Pasta.  Meats  Avoid: Salted, canned, smoked, spiced, pickled meats, including fish and poultry. Bacon, ham, sausage, cold cuts, hot dogs, anchovies.   Choose: Low-sodium canned tuna and salmon. Fresh or frozen meat, poultry, and fish.  Dairy  Avoid: Processed cheese and spreads. Cottage cheese. Buttermilk and condensed milk. Regular cheese.   Choose: Milk. Low-sodium cottage cheese. Yogurt. Sour cream. Low-sodium cheese.  Fruits and Vegetables  Avoid: Regular canned vegetables. Regular canned tomato sauce and paste. Frozen vegetables in sauces. Olives. Angie Fava. Relishes. Sauerkraut.   Choose: Low-sodium canned vegetables. Low-sodium tomato sauce and paste. Frozen or fresh vegetables. Fresh and frozen fruit.  Condiments  Avoid: Canned and packaged gravies. Worcestershire sauce. Tartar sauce. Barbecue sauce. Soy sauce. Steak sauce. Ketchup. Onion, garlic, and table salt. Meat flavorings and tenderizers.   Choose: Fresh and dried herbs and spices. Low-sodium varieties of mustard and ketchup. Lemon juice. Tabasco sauce. Horseradish.  SAMPLE 2 GRAM SODIUM MEAL PLAN Breakfast / Sodium (mg)  1 cup low-fat milk / 606 mg   2 slices  whole-wheat toast / 270 mg   1 tbs heart-healthy margarine / 153 mg   1 hard-boiled egg / 139 mg   1 small orange / 0 mg  Lunch / Sodium (mg)  1 cup raw carrots / 76 mg    cup hummus / 298 mg   1 cup low-fat milk / 143 mg    cup red grapes / 2 mg   1 whole-wheat pita bread / 356 mg  Dinner / Sodium (mg)  1 cup whole-wheat pasta / 2 mg   1 cup low-sodium tomato sauce / 73 mg   3 oz lean ground beef / 57 mg   1 small side salad (1 cup raw spinach leaves,  cup cucumber,  cup yellow bell pepper) with 1 tsp  olive oil and 1 tsp red wine vinegar / 25 mg  Snack / Sodium (mg)  1 container low-fat vanilla yogurt / 107 mg   3 graham cracker squares / 127 mg  Nutrient Analysis  Calories: 2033   Protein: 77 g   Carbohydrate: 282 g   Fat: 72 g   Sodium: 1971 mg  Document Released: 09/06/2005 Document Revised: 08/26/2011 Document Reviewed: 12/08/2009 [facility] Patient Information 2012 [place], [place].

## 2012-05-25 NOTE — Assessment & Plan Note (Signed)
Weight reduction advised

## 2012-05-25 NOTE — Assessment & Plan Note (Signed)
Likely due to venous insufficiency/ obesity/ diastolic CHF  2 gram sodium diet advised Continue lasix 8m daily as recommended by Dr SLia Foyer  In addition, she should weight daily and take an additional 474mfor weight gain more than 2 lbs overnight.    She will follow-up with Dr StLia Foyernd I will see as needed.

## 2012-05-25 NOTE — Assessment & Plan Note (Signed)
The importance of lifestyle modification including weight reduction and regular exercise were stressed today.  I think that she would benefit from pulmonary rehab.  She states that Dr Elsworth Soho has recommended this previously.  She will discuss this option with him again.

## 2012-05-25 NOTE — Progress Notes (Signed)
PCP:  [name], MD Primary Cardiologist:  Dr Lia Foyer  The patient presents today for electrophysiology followup.  She was seen previously by Dr Caryl Comes during her hospitalization for sick sinus syndrome with pauses and presyncope.  At that time, she was taken off of beta blockers and medroxyprogesterone.  She subsequently had an event monitor placed which revealed no pauses or worrisome bradyarrhythmias.  Since last being seen by Dr Lia Foyer, she states that she has been doing very well.  She denies any further "panic" episodes or presyncope.  He feels that she has returned to her baseline health state. Her concern today is with worsening BLE edema which is a chronic issue. Today, she denies symptoms of palpitations, chest pain, shortness of breath (above her baseline), orthopnea, PND, syncope, or neurologic sequela.  She requires O2 chronically.  The patient feels that she is tolerating medications without difficulties and is otherwise without complaint today.   Past Medical History  Diagnosis Date  . Hypertension   . Hyperlipidemia   . Anemia   . CAD (coronary artery disease)     a. Nonobstructive CAD 2007 (EF 75%.  RCA  proximal 75% tubular, 25% prox LAD, 25% d1, 50% D2) at [facility]. b. NSTEMI 01/2010 in setting of respiratory failure, medical approach (not a good candidate for noninvasive eval)  . OSA on CPAP   . Hypothyroidism   . Peripheral edema 06/13/2011  . Asthma 06/13/2011  . Depression 06/13/2011  . Neuropathy 06/13/2011  . Cervical radiculopathy 06/13/2011  . Gout 06/13/2011  . Obesity hypoventilation syndrome 06/13/2011  . COPD (chronic obstructive pulmonary disease)     on O2  . Breast cancer dx'd 2005    left  . Cellulitis     hx of cellulitis in left leg   Past Surgical History  Procedure Date  . Ovarian cyst removal   . Breast lumpectomy   . Cervical disc surgery     have cadaver bones,, screws, and plates   . Cervical biopsy June 2013    at [facility] for Heavy  Bleeding    . Dilation and curettage of uterus   . Colonoscopy   . Laparoscopic appendectomy 03/29/2012    Procedure: APPENDECTOMY LAPAROSCOPIC;  Surgeon: Adin Hector, MD;  Location: [facility];  Service: General;  Laterality: N/A;    Current Outpatient Prescriptions  Medication Sig Dispense Refill  . albuterol (PROAIR HFA) 108 (90 BASE) MCG/ACT inhaler Inhale 2 puffs into the lungs every 6 (six) hours as needed.        [name] allopurinol (ZYLOPRIM) 300 MG tablet Take 300 mg by mouth daily.      [name] amitriptyline (ELAVIL) 100 MG tablet Take 1 tablet (100 mg total) by mouth at bedtime.  90 tablet  1  . amLODipine (NORVASC) 2.5 MG tablet Take 1 tablet (2.5 mg total) by mouth daily.  30 tablet  11  . aspirin 81 MG tablet Take 81 mg by mouth daily.        . Cholecalciferol (VITAMIN D3) 2000 UNITS TABS Take 1 tablet by mouth daily.        . DULoxetine (CYMBALTA) 30 MG capsule Take 30 mg by mouth daily. Take 2 at bedtime      . fish oil-omega-3 fatty acids 1000 MG capsule Take 2 g by mouth daily.        . furosemide (LASIX) 40 MG tablet Take 1 tablet (40 mg total) by mouth daily.  1 tablet    . gabapentin (  NEURONTIN) 300 MG capsule Take 1 capsule (300 mg total) by mouth 3 (three) times daily.  90 capsule  5  . levothyroxine (SYNTHROID, LEVOTHROID) 200 MCG tablet Take 200 mcg by mouth daily.       . potassium chloride (KLOR-CON) 10 MEQ CR tablet Take 10 mEq by mouth daily.      . pravastatin (PRAVACHOL) 40 MG tablet Take 40 mg by mouth daily.      . vitamin C (ASCORBIC ACID) 500 MG tablet Take 500 mg by mouth daily.          Allergies  Allergen Reactions  . Codeine     REACTION: unknown - pt. states unaware of having reaction to codeine  . Lisinopril     REACTION: cough    History   Social History  . Marital Status: Divorced    Spouse Name: N/A    Number of Children: N/A  . Years of Education: N/A   Occupational History  . Not on file.   Social History Main Topics  . Smoking status: Former Smoker  -- 1.0 packs/day for 20 years    Types: Cigarettes    Quit date: 09/20/1998  . Smokeless tobacco: Never Used   Comment: 1ppd x 20 years  . Alcohol Use: Yes     rare  . Drug Use: No  . Sexually Active: Not Currently    Birth Control/ Protection: Post-menopausal   Other Topics Concern  . Not on file   Social History Narrative  . No narrative on file    Family History  Problem Relation Age of Onset  . Breast cancer    . Uterine cancer Mother   . Cancer Mother     Theadora Rama  . Heart disease Mother   . Cancer Sister 53    breast  . Diabetes Brother   . Cancer Maternal Aunt     breast  . Cancer Cousin 40    breast    Physical Exam: Filed Vitals:   05/25/12 0922  BP: 140/78  Pulse: 99  Height: 5' 5"  (1.651 m)  Weight: 337 lb (152.862 kg)    GEN- The patient is obese and chronically ill appearing, alert and oriented x 3 today.   Head- normocephalic, atraumatic Eyes-  Sclera clear, conjunctiva pink Ears- hearing intact Oropharynx- clear Neck- supple  Lymph- no cervical lymphadenopathy Lungs- Clear to ausculation bilaterally, normal work of breathing, wearing O2 today Heart- Regular rate and rhythm, no murmurs, rubs or gallops, PMI not laterally displaced GI- soft, NT, ND, + BS Extremities- no clubbing, cyanosis, +1 chronic edema with venous stasis changes MS- no significant deformity or atrophy, uses a rolling walker Skin- no rash or lesion Psych- euthymic mood, full affect, very pleasant today Neuro- strength and sensation are intact  ekg today reveals sinus rhythm 99 bpm, PR 156, QRS 84, Qtc 454  Assessment and Plan:

## 2012-05-26 ENCOUNTER — Ambulatory Visit: Payer: Medicare Other | Admitting: Cardiology

## 2012-05-29 ENCOUNTER — Other Ambulatory Visit: Payer: Self-pay | Admitting: Internal Medicine

## 2012-06-08 ENCOUNTER — Encounter: Payer: Self-pay | Admitting: Cardiology

## 2012-06-08 ENCOUNTER — Ambulatory Visit (INDEPENDENT_AMBULATORY_CARE_PROVIDER_SITE_OTHER): Payer: Medicare Other | Admitting: Cardiology

## 2012-06-08 VITALS — BP 128/72 | HR 99 | Ht 65.0 in | Wt 337.0 lb

## 2012-06-08 DIAGNOSIS — I495 Sick sinus syndrome: Secondary | ICD-10-CM

## 2012-06-08 DIAGNOSIS — G4730 Sleep apnea, unspecified: Secondary | ICD-10-CM | POA: Diagnosis not present

## 2012-06-08 DIAGNOSIS — R609 Edema, unspecified: Secondary | ICD-10-CM | POA: Diagnosis not present

## 2012-06-08 NOTE — Patient Instructions (Addendum)
Your physician recommends that you schedule a follow-up appointment in: [facility] [place] has recommended you make the following change in your medication: STOP Amlodipine

## 2012-06-10 NOTE — Progress Notes (Signed)
HPI:  The patient comes in today and states that she think she is doing a lot better. She was seen by BP service, and they recommended some medication changes. She has not had syncope or presyncope.  She comes in today with her nasal oxygen. Overall she is quite stable  Current Outpatient Prescriptions  Medication Sig Dispense Refill  . albuterol (PROAIR HFA) 108 (90 BASE) MCG/ACT inhaler Inhale 2 puffs into the lungs every 6 (six) hours as needed.        [name] allopurinol (ZYLOPRIM) 300 MG tablet Take 300 mg by mouth daily.      [name] amitriptyline (ELAVIL) 100 MG tablet Take 1 tablet (100 mg total) by mouth at bedtime.  90 tablet  1  . aspirin 81 MG tablet Take 81 mg by mouth daily.        . Cholecalciferol (VITAMIN D3) 2000 UNITS TABS Take 1 tablet by mouth daily.        . CYMBALTA 30 MG capsule TAKE 1 CAPSULE TWICE A DAY  180 capsule  3  . DULoxetine (CYMBALTA) 30 MG capsule Take 30 mg by mouth daily. Take 2 at bedtime      . fish oil-omega-3 fatty acids 1000 MG capsule Take 2 g by mouth daily.        . furosemide (LASIX) 40 MG tablet Take 1 tablet (40 mg total) by mouth daily.  1 tablet    . levothyroxine (SYNTHROID, LEVOTHROID) 200 MCG tablet Take 200 mcg by mouth daily.       . potassium chloride (KLOR-CON) 10 MEQ CR tablet Take 10 mEq by mouth daily.      . pravastatin (PRAVACHOL) 40 MG tablet Take 40 mg by mouth daily.      . vitamin C (ASCORBIC ACID) 500 MG tablet Take 500 mg by mouth daily.          Allergies  Allergen Reactions  . Codeine     REACTION: unknown - pt. states unaware of having reaction to codeine  . Lisinopril     REACTION: cough    Past Medical History  Diagnosis Date  . Hypertension   . Hyperlipidemia   . Anemia   . CAD (coronary artery disease)     a. Nonobstructive CAD 2007 (EF 75%.  RCA  proximal 75% tubular, 25% prox LAD, 25% d1, 50% D2) at [facility]. b. NSTEMI 01/2010 in setting of respiratory failure, medical approach (not a good candidate for noninvasive  eval)  . OSA on CPAP   . Hypothyroidism   . Peripheral edema 06/13/2011  . Asthma 06/13/2011  . Depression 06/13/2011  . Neuropathy 06/13/2011  . Cervical radiculopathy 06/13/2011  . Gout 06/13/2011  . Obesity hypoventilation syndrome 06/13/2011  . COPD (chronic obstructive pulmonary disease)     on O2  . Breast cancer dx'd 2005    left  . Cellulitis     hx of cellulitis in left leg    Past Surgical History  Procedure Date  . Ovarian cyst removal   . Breast lumpectomy   . Cervical disc surgery     have cadaver bones,, screws, and plates   . Cervical biopsy June 2013    at [facility] for Heavy  Bleeding  . Dilation and curettage of uterus   . Colonoscopy   . Laparoscopic appendectomy 03/29/2012    Procedure: APPENDECTOMY LAPAROSCOPIC;  Surgeon: Adin Hector, MD;  Location: [facility];  Service: General;  Laterality: N/A;    Family History  Problem Relation Age of Onset  . Breast cancer    . Uterine cancer Mother   . Cancer Mother     Theadora Rama  . Heart disease Mother   . Cancer Sister 53    breast  . Diabetes Brother   . Cancer Maternal Aunt     breast  . Cancer Cousin 40    breast    History   Social History  . Marital Status: Divorced    Spouse Name: N/A    Number of Children: N/A  . Years of Education: N/A   Occupational History  . Not on file.   Social History Main Topics  . Smoking status: Former Smoker -- 1.0 packs/day for 20 years    Types: Cigarettes    Quit date: 09/20/1998  . Smokeless tobacco: Never Used   Comment: 1ppd x 20 years  . Alcohol Use: Yes     rare  . Drug Use: No  . Sexually Active: Not Currently    Birth Control/ Protection: Post-menopausal   Other Topics Concern  . Not on file   Social History Narrative  . No narrative on file    ROS: Please see the HPI.  All other systems reviewed and negative.  PHYSICAL EXAM:  BP 128/72  Pulse 99  Ht 5' 5"  (1.651 m)  Wt 337 lb (152.862 kg)  BMI 56.08 kg/m2  SpO2 98%  General: Very  pleasant overweight female in no distress. Nasal O2.  Smile on her face.   Head:  Normocephalic and atraumatic. Neck: no JVD Lungs: Clear to auscultation and percussion. Heart: Normal S1 and S2.  No murmur, rubs or gallops.  Abdomen:  Normal bowel sounds; soft; non tender; no organomegaly Pulses: Pulses normal in all 4 extremities. Extremities: No clubbing or cyanosis. No edema. Neurologic: Alert and oriented x 3.  EKG:  9/5  NSR.  Normal QTc.  Rate normal.  Delay R wave progression.  No acute changes.    ECHO  03/09/2012  Study Conclusions  - Left ventricle: The cavity size was normal. Systolic function was normal. The estimated ejection fraction was in the range of 55% to 60%. Wall motion was normal; there were no regional wall motion abnormalities. - Aortic valve: Mild regurgitation. - Mitral valve: Calcified annulus. Mildly thickened leaflets . - Left atrium: The atrium was moderately dilated. - Pulmonary arteries: Systolic pressure was mildly increased.     ASSESSMENT AND PLAN:

## 2012-06-11 NOTE — Assessment & Plan Note (Signed)
Remains on diuretic.  Will need to get a BMET.

## 2012-06-11 NOTE — Assessment & Plan Note (Signed)
She remains on CPAP at home.

## 2012-06-11 NOTE — Assessment & Plan Note (Signed)
This is improved since she was discharged from the hospital. Dr. Rayann Heman is recommended against permanent pacing. We will follow his lead.

## 2012-06-13 ENCOUNTER — Ambulatory Visit (INDEPENDENT_AMBULATORY_CARE_PROVIDER_SITE_OTHER): Payer: Medicare Other | Admitting: *Deleted

## 2012-06-13 DIAGNOSIS — R609 Edema, unspecified: Secondary | ICD-10-CM

## 2012-06-13 LAB — BASIC METABOLIC PANEL
BUN: 10 mg/dL (ref 6–23)
Calcium: 9.3 mg/dL (ref 8.4–10.5)
GFR: 87.26 mL/min (ref >60.00)
Potassium: 3.2 mEq/L — ABNORMAL LOW (ref 3.5–5.1)
Sodium: 147 mEq/L — ABNORMAL HIGH (ref 135–145)

## 2012-06-14 ENCOUNTER — Other Ambulatory Visit: Payer: Self-pay

## 2012-06-14 DIAGNOSIS — E876 Hypokalemia: Secondary | ICD-10-CM

## 2012-06-16 ENCOUNTER — Other Ambulatory Visit: Payer: Self-pay | Admitting: Internal Medicine

## 2012-06-22 ENCOUNTER — Other Ambulatory Visit (INDEPENDENT_AMBULATORY_CARE_PROVIDER_SITE_OTHER): Payer: Medicare Other

## 2012-06-22 DIAGNOSIS — E876 Hypokalemia: Secondary | ICD-10-CM | POA: Diagnosis not present

## 2012-06-22 LAB — BASIC METABOLIC PANEL
CO2: 29 mEq/L (ref 19–32)
Calcium: 9.1 mg/dL (ref 8.4–10.5)
Chloride: 103 mEq/L (ref 96–112)
Sodium: 141 mEq/L (ref 135–145)

## 2012-06-27 ENCOUNTER — Ambulatory Visit: Payer: Medicare Other | Admitting: Internal Medicine

## 2012-06-29 ENCOUNTER — Ambulatory Visit (INDEPENDENT_AMBULATORY_CARE_PROVIDER_SITE_OTHER): Payer: Medicare Other | Admitting: *Deleted

## 2012-06-29 DIAGNOSIS — I10 Essential (primary) hypertension: Secondary | ICD-10-CM | POA: Diagnosis not present

## 2012-06-29 LAB — BASIC METABOLIC PANEL
BUN: 9 mg/dL (ref 6–23)
Chloride: 100 mEq/L (ref 96–112)
Potassium: 3.9 mEq/L (ref 3.5–5.1)
Sodium: 139 mEq/L (ref 135–145)

## 2012-07-05 ENCOUNTER — Other Ambulatory Visit: Payer: Self-pay

## 2012-07-05 DIAGNOSIS — E876 Hypokalemia: Secondary | ICD-10-CM

## 2012-07-05 MED ORDER — POTASSIUM CHLORIDE CRYS ER 20 MEQ PO TBCR: 20.0000 meq | EXTENDED_RELEASE_TABLET | Freq: Every day | ORAL | Status: DC

## 2012-07-12 ENCOUNTER — Other Ambulatory Visit: Payer: Self-pay | Admitting: Internal Medicine

## 2012-07-19 ENCOUNTER — Other Ambulatory Visit (INDEPENDENT_AMBULATORY_CARE_PROVIDER_SITE_OTHER): Payer: Medicare Other

## 2012-07-19 DIAGNOSIS — E876 Hypokalemia: Secondary | ICD-10-CM

## 2012-07-19 LAB — BASIC METABOLIC PANEL
CO2: 30 mEq/L (ref 19–32)
Calcium: 9 mg/dL (ref 8.4–10.5)
Chloride: 102 mEq/L (ref 96–112)
Glucose, Bld: 100 mg/dL — ABNORMAL HIGH (ref 70–99)
Potassium: 3.5 mEq/L (ref 3.5–5.1)
Sodium: 141 mEq/L (ref 135–145)

## 2012-09-04 ENCOUNTER — Other Ambulatory Visit: Payer: Self-pay | Admitting: Internal Medicine

## 2012-09-04 NOTE — Telephone Encounter (Signed)
Done erx - to [facility]

## 2012-09-06 ENCOUNTER — Ambulatory Visit: Payer: Medicare Other | Admitting: Cardiology

## 2012-09-27 ENCOUNTER — Encounter: Payer: Self-pay | Admitting: Cardiology

## 2012-09-27 ENCOUNTER — Ambulatory Visit (INDEPENDENT_AMBULATORY_CARE_PROVIDER_SITE_OTHER): Payer: Medicare Other | Admitting: Cardiology

## 2012-09-27 VITALS — BP 134/76 | HR 88 | Ht 65.5 in | Wt 346.0 lb

## 2012-09-27 DIAGNOSIS — E7800 Pure hypercholesterolemia, unspecified: Secondary | ICD-10-CM

## 2012-09-27 DIAGNOSIS — I2584 Coronary atherosclerosis due to calcified coronary lesion: Secondary | ICD-10-CM | POA: Diagnosis not present

## 2012-09-27 DIAGNOSIS — I2510 Atherosclerotic heart disease of native coronary artery without angina pectoris: Secondary | ICD-10-CM

## 2012-09-27 DIAGNOSIS — E785 Hyperlipidemia, unspecified: Secondary | ICD-10-CM

## 2012-09-27 DIAGNOSIS — G4730 Sleep apnea, unspecified: Secondary | ICD-10-CM

## 2012-09-27 DIAGNOSIS — E662 Morbid (severe) obesity with alveolar hypoventilation: Secondary | ICD-10-CM

## 2012-09-27 DIAGNOSIS — Z6841 Body Mass Index (BMI) 40.0 and over, adult: Secondary | ICD-10-CM

## 2012-09-27 DIAGNOSIS — I10 Essential (primary) hypertension: Secondary | ICD-10-CM

## 2012-09-27 NOTE — Patient Instructions (Addendum)
Fasting Lab work next week at [facility]. Follow up with Dr.Crenshaw in 4 months.

## 2012-09-30 NOTE — Progress Notes (Signed)
HPI:  This nice lady is in for followup. Overall she is stable. We had a very long discussion about some of her personal issues surrounding long-term inability to lose weight. She's been maintained on home O2. We discussed the importance of lifestyle change in weight reduction in alleviating some of the factors associated with impairment of long-term outcome. This very nice lady seemed to understand most of this and have a good grasp and some of the problems.  Current Outpatient Prescriptions  Medication Sig Dispense Refill  . albuterol (PROAIR HFA) 108 (90 BASE) MCG/ACT inhaler Inhale 2 puffs into the lungs every 6 (six) hours as needed.        [name] allopurinol (ZYLOPRIM) 300 MG tablet Take 300 mg by mouth daily.      [name] amitriptyline (ELAVIL) 100 MG tablet TAKE 1 TABLET AT BEDTIME  90 tablet  1  . aspirin 81 MG tablet Take 81 mg by mouth daily.        . Cholecalciferol (VITAMIN D3) 2000 UNITS TABS Take 1 tablet by mouth daily.        . DULoxetine (CYMBALTA) 30 MG capsule Take 30 mg by mouth daily. Take 2 at bedtime      . fish oil-omega-3 fatty acids 1000 MG capsule Take 2 g by mouth daily.        . furosemide (LASIX) 40 MG tablet Take 1 tablet (40 mg total) by mouth daily.  1 tablet    . levothyroxine (SYNTHROID, LEVOTHROID) 200 MCG tablet Take 200 mcg by mouth daily.       . potassium chloride SA (K-DUR,KLOR-CON) 20 MEQ tablet Take 1 tablet (20 mEq total) by mouth daily.  30 tablet  3  . pravastatin (PRAVACHOL) 40 MG tablet TAKE 1 TABLET DAILY  90 tablet  3  . vitamin C (ASCORBIC ACID) 500 MG tablet Take 500 mg by mouth daily.          Allergies  Allergen Reactions  . Codeine     REACTION: unknown - pt. states unaware of having reaction to codeine  . Lisinopril     REACTION: cough    Past Medical History  Diagnosis Date  . Hypertension   . Hyperlipidemia   . Anemia   . CAD (coronary artery disease)     a. Nonobstructive CAD 2007 (EF 75%.  RCA  proximal 75% tubular, 25% prox LAD,  25% d1, 50% D2) at [facility]. b. NSTEMI 01/2010 in setting of respiratory failure, medical approach (not a good candidate for noninvasive eval)  . OSA on CPAP   . Hypothyroidism   . Peripheral edema 06/13/2011  . Asthma 06/13/2011  . Depression 06/13/2011  . Neuropathy 06/13/2011  . Cervical radiculopathy 06/13/2011  . Gout 06/13/2011  . Obesity hypoventilation syndrome 06/13/2011  . COPD (chronic obstructive pulmonary disease)     on O2  . Breast cancer dx'd 2005    left  . Cellulitis     hx of cellulitis in left leg    Past Surgical History  Procedure Date  . Ovarian cyst removal   . Breast lumpectomy   . Cervical disc surgery     have cadaver bones,, screws, and plates   . Cervical biopsy June 2013    at [facility]  Ble[facility]  . Dilation and curettage of uterus   . Colonoscopy   . Laparoscopic appendectomy 03/29/2012    Procedure: APPENDECTOMY LAPAROSCOPIC;  Surgeon: Adin Hector, MD;  Location: [facility];  Service: General;  Laterality: N/A;    Family History  Problem Relation Age of Onset  . Breast cancer    . Uterine cancer Mother   . Cancer Mother     Theadora Rama  . Heart disease Mother   . Cancer Sister 78    breast  . Diabetes Brother   . Cancer Maternal Aunt     breast  . Cancer Cousin 40    breast    History   Social History  . Marital Status: Divorced    Spouse Name: N/A    Number of Children: N/A  . Years of Education: N/A   Occupational History  . Not on file.   Social History Main Topics  . Smoking status: Former Smoker -- 1.0 packs/day for 20 years    Types: Cigarettes    Quit date: 09/20/1998  . Smokeless tobacco: Never Used     Comment: 1ppd x 20 years  . Alcohol Use: Yes     Comment: rare  . Drug Use: No  . Sexually Active: Not Currently    Birth Control/ Protection: Post-menopausal   Other Topics Concern  . Not on file   Social History Narrative  . No narrative on file    ROS: Please see the HPI.  All other systems reviewed and  negative.  PHYSICAL EXAM:  BP 134/76  Pulse 88  Ht 5' 5.5" (1.664 m)  Wt 346 lb (156.945 kg)  BMI 56.70 kg/m2  SpO2 97%  General: Well developed, well nourished, in no acute distress.  02 in place.  Sitting in chair.   Head:  Normocephalic and atraumatic. Neck: no JVD Lungs: Clear to auscultation and percussion. Heart: Normal S1 and S2.  No murmur, rubs or gallops.  Abdomen: Not examined.  Sitting in chairt.   Pulses: Pulses normal in all 4 extremities. Extremities: No clubbing or cyanosis. Trace edema.   Neurologic: Alert and oriented x 3.  EKG:  NSR.  WNL.   ASSESSMENT AND PLAN:

## 2012-10-02 NOTE — Assessment & Plan Note (Signed)
She remains stable.  Last cath was at [facility].  No recent symptoms.

## 2012-10-02 NOTE — Assessment & Plan Note (Signed)
Currently followed by pulmonary medicine.

## 2012-10-02 NOTE — Assessment & Plan Note (Signed)
Long discussion about this today.

## 2012-10-04 DIAGNOSIS — Z23 Encounter for immunization: Secondary | ICD-10-CM | POA: Diagnosis not present

## 2012-10-05 ENCOUNTER — Other Ambulatory Visit (INDEPENDENT_AMBULATORY_CARE_PROVIDER_SITE_OTHER): Payer: Medicare Other

## 2012-10-05 DIAGNOSIS — I2584 Coronary atherosclerosis due to calcified coronary lesion: Secondary | ICD-10-CM

## 2012-10-05 DIAGNOSIS — E7800 Pure hypercholesterolemia, unspecified: Secondary | ICD-10-CM

## 2012-10-05 LAB — LIPID PANEL
HDL: 48.3 mg/dL (ref >39.00)
LDL Cholesterol: 89 mg/dL (ref 0–99)
Total CHOL/HDL Ratio: 3
Triglycerides: 116 mg/dL (ref 0.0–149.0)
VLDL: 23.2 mg/dL (ref 0.0–40.0)

## 2012-10-05 LAB — BASIC METABOLIC PANEL
CO2: 33 mEq/L — ABNORMAL HIGH (ref 19–32)
Chloride: 98 mEq/L (ref 96–112)
Glucose, Bld: 115 mg/dL — ABNORMAL HIGH (ref 70–99)
Potassium: 3.6 mEq/L (ref 3.5–5.1)
Sodium: 140 mEq/L (ref 135–145)

## 2012-10-05 LAB — HEPATIC FUNCTION PANEL
Albumin: 3.7 g/dL (ref 3.5–5.2)
Total Bilirubin: 0.7 mg/dL (ref 0.3–1.2)

## 2012-10-16 NOTE — Assessment & Plan Note (Signed)
Appropriate time to recheck lipid and liver profiles.

## 2012-10-16 NOTE — Assessment & Plan Note (Signed)
Controlled.  

## 2012-10-16 NOTE — Assessment & Plan Note (Signed)
Reviewed this in some detail with the patient.

## 2012-10-20 ENCOUNTER — Other Ambulatory Visit: Payer: Self-pay

## 2012-10-20 DIAGNOSIS — E039 Hypothyroidism, unspecified: Secondary | ICD-10-CM

## 2012-10-20 DIAGNOSIS — I2510 Atherosclerotic heart disease of native coronary artery without angina pectoris: Secondary | ICD-10-CM

## 2012-10-20 DIAGNOSIS — R6889 Other general symptoms and signs: Secondary | ICD-10-CM

## 2012-10-20 MED ORDER — PRAVASTATIN SODIUM 40 MG PO TABS: ORAL_TABLET | ORAL | Status: DC

## 2012-10-27 ENCOUNTER — Emergency Department (HOSPITAL_COMMUNITY)
Admission: EM | Admit: 2012-10-27 | Discharge: 2012-10-27 | Disposition: A | Discharge status: Home / Self Care | Payer: Medicare Other | Attending: Emergency Medicine | Admitting: Emergency Medicine

## 2012-10-27 ENCOUNTER — Emergency Department (HOSPITAL_COMMUNITY): Payer: Medicare Other

## 2012-10-27 DIAGNOSIS — R059 Cough, unspecified: Secondary | ICD-10-CM | POA: Insufficient documentation

## 2012-10-27 DIAGNOSIS — E785 Hyperlipidemia, unspecified: Secondary | ICD-10-CM | POA: Insufficient documentation

## 2012-10-27 DIAGNOSIS — Z87891 Personal history of nicotine dependence: Secondary | ICD-10-CM | POA: Insufficient documentation

## 2012-10-27 DIAGNOSIS — Z7982 Long term (current) use of aspirin: Secondary | ICD-10-CM | POA: Diagnosis not present

## 2012-10-27 DIAGNOSIS — J441 Chronic obstructive pulmonary disease with (acute) exacerbation: Secondary | ICD-10-CM | POA: Diagnosis not present

## 2012-10-27 DIAGNOSIS — Z923 Personal history of irradiation: Secondary | ICD-10-CM | POA: Diagnosis not present

## 2012-10-27 DIAGNOSIS — M109 Gout, unspecified: Secondary | ICD-10-CM | POA: Diagnosis not present

## 2012-10-27 DIAGNOSIS — Z862 Personal history of diseases of the blood and blood-forming organs and certain disorders involving the immune mechanism: Secondary | ICD-10-CM | POA: Diagnosis not present

## 2012-10-27 DIAGNOSIS — Z9981 Dependence on supplemental oxygen: Secondary | ICD-10-CM | POA: Insufficient documentation

## 2012-10-27 DIAGNOSIS — Z8739 Personal history of other diseases of the musculoskeletal system and connective tissue: Secondary | ICD-10-CM | POA: Diagnosis not present

## 2012-10-27 DIAGNOSIS — I2510 Atherosclerotic heart disease of native coronary artery without angina pectoris: Secondary | ICD-10-CM | POA: Diagnosis not present

## 2012-10-27 DIAGNOSIS — G4733 Obstructive sleep apnea (adult) (pediatric): Secondary | ICD-10-CM | POA: Insufficient documentation

## 2012-10-27 DIAGNOSIS — E662 Morbid (severe) obesity with alveolar hypoventilation: Secondary | ICD-10-CM | POA: Insufficient documentation

## 2012-10-27 DIAGNOSIS — Z8669 Personal history of other diseases of the nervous system and sense organs: Secondary | ICD-10-CM | POA: Diagnosis not present

## 2012-10-27 DIAGNOSIS — Z872 Personal history of diseases of the skin and subcutaneous tissue: Secondary | ICD-10-CM | POA: Insufficient documentation

## 2012-10-27 DIAGNOSIS — J449 Chronic obstructive pulmonary disease, unspecified: Secondary | ICD-10-CM | POA: Diagnosis not present

## 2012-10-27 DIAGNOSIS — I10 Essential (primary) hypertension: Secondary | ICD-10-CM | POA: Insufficient documentation

## 2012-10-27 DIAGNOSIS — R05 Cough: Secondary | ICD-10-CM | POA: Insufficient documentation

## 2012-10-27 DIAGNOSIS — Z853 Personal history of malignant neoplasm of breast: Secondary | ICD-10-CM | POA: Insufficient documentation

## 2012-10-27 DIAGNOSIS — N61 Mastitis without abscess: Secondary | ICD-10-CM | POA: Diagnosis not present

## 2012-10-27 DIAGNOSIS — R61 Generalized hyperhidrosis: Secondary | ICD-10-CM | POA: Insufficient documentation

## 2012-10-27 DIAGNOSIS — Z79899 Other long term (current) drug therapy: Secondary | ICD-10-CM | POA: Insufficient documentation

## 2012-10-27 DIAGNOSIS — E039 Hypothyroidism, unspecified: Secondary | ICD-10-CM | POA: Diagnosis not present

## 2012-10-27 LAB — BASIC METABOLIC PANEL
BUN: 11 mg/dL (ref 6–23)
CO2: 32 mEq/L (ref 19–32)
Calcium: 9.4 mg/dL (ref 8.4–10.5)
Creatinine, Ser: 0.82 mg/dL (ref 0.50–1.10)
Glucose, Bld: 113 mg/dL — ABNORMAL HIGH (ref 70–99)

## 2012-10-27 LAB — CBC WITH DIFFERENTIAL/PLATELET
Eosinophils Relative: 1 % (ref 0–5)
HCT: 38.5 % (ref 36.0–46.0)
Lymphocytes Relative: 12 % (ref 12–46)
Lymphs Abs: 1.7 10*3/uL (ref 0.7–4.0)
MCH: 27.8 pg (ref 26.0–34.0)
MCV: 86.3 fL (ref 78.0–100.0)
Monocytes Absolute: 0.6 10*3/uL (ref 0.1–1.0)
RBC: 4.46 MIL/uL (ref 3.87–5.11)
WBC: 14.1 10*3/uL — ABNORMAL HIGH (ref 4.0–10.5)

## 2012-10-27 LAB — URINALYSIS, ROUTINE W REFLEX MICROSCOPIC
Bilirubin Urine: NEGATIVE
Glucose, UA: NEGATIVE mg/dL
Hgb urine dipstick: NEGATIVE
Ketones, ur: 15 mg/dL — AB
Protein, ur: NEGATIVE mg/dL

## 2012-10-27 LAB — URINE MICROSCOPIC-ADD ON

## 2012-10-27 MED ORDER — OXYCODONE-ACETAMINOPHEN 5-325 MG PO TABS: 2.0000 | ORAL_TABLET | Freq: Once | ORAL | Status: DC

## 2012-10-27 MED ORDER — CLINDAMYCIN HCL 300 MG PO CAPS: 300.0000 mg | ORAL_CAPSULE | Freq: Four times a day (QID) | ORAL | Status: DC

## 2012-10-27 MED ORDER — CLINDAMYCIN HCL 300 MG PO CAPS
300.0000 mg | ORAL_CAPSULE | Freq: Once | ORAL | Status: AC
  Administered 2012-10-27: 300 mg via ORAL
  Filled 2012-10-27: qty 1

## 2012-10-27 MED ORDER — OXYCODONE-ACETAMINOPHEN 5-325 MG PO TABS
2.0000 | ORAL_TABLET | Freq: Once | ORAL | Status: AC
  Administered 2012-10-27: 2 via ORAL
  Filled 2012-10-27: qty 2

## 2012-10-27 NOTE — ED Provider Notes (Signed)
History     CSN: [id number]  Arrival date & time 10/27/12  0247   First MD Initiated Contact with Patient 10/27/12 0300      Chief Complaint  Patient presents with  . Fever    (Consider location/radiation/quality/duration/timing/severity/associated sxs/prior treatment) HPI 67 year old female presents to emergency department complaining of fever, and redness warmth and pain to her left breast. She has cough, but has history of COPD and her cough has not changed from her baseline. No nausea no vomiting. No abdominal pain, no urinary symptoms. Patient received flu shot this year. Patient has history of breast cancer in her left breast that is post lumpectomy and radiation. She has history of cellulitis in her legs Past Medical History  Diagnosis Date  . Hypertension   . Hyperlipidemia   . Anemia   . CAD (coronary artery disease)     a. Nonobstructive CAD 2007 (EF 75%.  RCA  proximal 75% tubular, 25% prox LAD, 25% d1, 50% D2) at [facility]. b. NSTEMI 01/2010 in setting of respiratory failure, medical approach (not a good candidate for noninvasive eval)  . OSA on CPAP   . Hypothyroidism   . Peripheral edema 06/13/2011  . Asthma 06/13/2011  . Depression 06/13/2011  . Neuropathy 06/13/2011  . Cervical radiculopathy 06/13/2011  . Gout 06/13/2011  . Obesity hypoventilation syndrome 06/13/2011  . COPD (chronic obstructive pulmonary disease)     on O2  . Breast cancer dx'd 2005    left  . Cellulitis     hx of cellulitis in left leg    Past Surgical History  Procedure Date  . Ovarian cyst removal   . Breast lumpectomy   . Cervical disc surgery     have cadaver bones,, screws, and plates   . Cervical biopsy June 2013    at [facility] for Heavy  Bleeding  . Dilation and curettage of uterus   . Colonoscopy   . Laparoscopic appendectomy 03/29/2012    Procedure: APPENDECTOMY LAPAROSCOPIC;  Surgeon: Adin Hector, MD;  Location: [facility];  Service: General;  Laterality: N/A;    Family History   Problem Relation Age of Onset  . Breast cancer    . Uterine cancer Mother   . Cancer Mother     Theadora Rama  . Heart disease Mother   . Cancer Sister 66    breast  . Diabetes Brother   . Cancer Maternal Aunt     breast  . Cancer Cousin 40    breast    History  Substance Use Topics  . Smoking status: Former Smoker -- 1.0 packs/day for 20 years    Types: Cigarettes    Quit date: 09/20/1998  . Smokeless tobacco: Never Used     Comment: 1ppd x 20 years  . Alcohol Use: Yes     Comment: rare    OB History    Grav Para Term Preterm Abortions TAB SAB Ect Mult Living                  Review of Systems  See History of Present Illness; otherwise all other systems are reviewed and negative  Allergies  Codeine and Lisinopril  Home Medications   Current Outpatient Rx  Name  Route  Sig  Dispense  Refill  . ALBUTEROL SULFATE HFA 108 (90 BASE) MCG/ACT IN AERS   Inhalation   Inhale 2 puffs into the lungs every 6 (six) hours as needed.           [name]  ALLOPURINOL 300 MG PO TABS   Oral   Take 300 mg by mouth daily.         [name] AMITRIPTYLINE HCL 100 MG PO TABS   Oral   Take 100 mg by mouth at bedtime.         [name] AMLODIPINE BESYLATE 2.5 MG PO TABS   Oral   Take 2.5 mg by mouth Daily.         . ASPIRIN 81 MG PO TABS   Oral   Take 81 mg by mouth daily.           [name] VITAMIN D3 2000 UNITS PO TABS   Oral   Take 1 tablet by mouth daily.           . DULOXETINE HCL 30 MG PO CPEP   Oral   Take 60 mg by mouth daily.          . OMEGA-3 FATTY ACIDS 1000 MG PO CAPS   Oral   Take 2 g by mouth daily.          . FUROSEMIDE 40 MG PO TABS   Oral   Take 1 tablet (40 mg total) by mouth daily.   1 tablet      . LEVOTHYROXINE SODIUM 200 MCG PO TABS   Oral   Take 200 mcg by mouth daily.          [name] POTASSIUM CHLORIDE 20 MEQ PO PACK   Oral   Take 20 mEq by mouth 2 (two) times daily.         [name] VITAMIN C 500 MG PO TABS   Oral   Take 500 mg by mouth daily.              BP 147/64  Pulse 111  Temp 101.4 F (38.6 C) (Oral)  Resp 23  SpO2 96%  Physical Exam  Nursing note and vitals reviewed. Constitutional: No distress.       Morbidly obese female, no acute distress  HENT:  Head: Normocephalic and atraumatic.  Nose: Nose normal.  Mouth/Throat: Oropharynx is clear and moist. No oropharyngeal exudate.  Eyes: Conjunctivae normal and EOM are normal. Pupils are equal, round, and reactive to light.  Neck: Normal range of motion. Neck supple. No JVD present. No tracheal deviation present. No thyromegaly present.  Pulmonary/Chest: Effort normal. No stridor. No respiratory distress. She has wheezes (trace end expiratory wheeze). She has no rales. She exhibits no tenderness.  Abdominal: Soft. Bowel sounds are normal. She exhibits no distension and no mass. There is no tenderness. There is no rebound and no guarding.  Musculoskeletal: Normal range of motion. She exhibits no tenderness.  Lymphadenopathy:    She has no cervical adenopathy.  Skin: Skin is warm and dry. No rash noted. She is not diaphoretic. There is erythema. No pallor.       Patient's left breast has area of warmth, erythema, and tenderness. The area extends out from the nipple to the top part of the breast. Patient has small nodular area to the left upper quadrant, patient indicates this is the site of her previous lumpectomy, and reports she always has nodule. There is no nipple discharge. No area of distinct abscess    ED Course  Procedures (including critical care time)  Labs Reviewed  CBC WITH DIFFERENTIAL - Abnormal; Notable for the following:    WBC 14.1 (*)     Neutrophils Relative 83 (*)     Neutro Abs 11.7 (*)  All other components within normal limits  BASIC METABOLIC PANEL - Abnormal; Notable for the following:    Potassium 3.2 (*)     Glucose, Bld 113 (*)     GFR calc non Af Amer 73 (*)     GFR calc Af Amer 85 (*)     All other components within normal limits   URINALYSIS, ROUTINE W REFLEX MICROSCOPIC - Abnormal; Notable for the following:    Color, Urine AMBER (*)  BIOCHEMICALS MAY BE AFFECTED BY COLOR   APPearance TURBID (*)     Ketones, ur 15 (*)     Leukocytes, UA SMALL (*)     All other components within normal limits  URINE MICROSCOPIC-ADD ON - Abnormal; Notable for the following:    Squamous Epithelial / LPF MANY (*)     All other components within normal limits   Dg Chest 2 View  10/27/2012  *RADIOLOGY REPORT*  Clinical Data: Fever.  COPD.  CHEST - 2 VIEW  Comparison: 04/01/2012  Findings: Enlargement with pulmonary vascular congestion.  Hazy perihilar opacity likely suggest edema.  Changes appear mildly progressed since the previous study.  No pneumothorax.  No blunting of costophrenic angles.  Degenerative changes in the spine.  IMPRESSION: Cardiac enlargement with pulmonary vascular congestion and mild perihilar edema.   Original Report Authenticated By: Lucienne Capers, M.D.      1. Cellulitis of breast       MDM  67 year old female with what appears to be mastitis, with fever. Patient overall looks nontoxic. Cellulitis is in the area of prior breast cancer. Will have her close follow closely with the breast clinic. Plan for oral antibiotics and followup in the emergency department over the weekend for recheck the area of infection.  Pt has tolerated medications.  WBC slightly elevated.  Will d/c home with prescriptions.        Kalman Drape, MD 10/27/12 [phone]

## 2012-10-27 NOTE — ED Notes (Addendum)
Pt reports fever and chills; checked fever at home- 102.9; woke up this morning sweating, hot, and increased BP; reports SOB-but is normal for her with COPD; reports cough which is normal for her as well; does report pain to L nipple with swelling

## 2012-10-27 NOTE — ED Notes (Signed)
Patient presents stating that she has been running a fever today (as high as 102.9), her pressure was up higher than usual and her left breast has been painful and warm to touch, unable to tell if it is red due to the fact that she has had radiation.

## 2012-10-29 ENCOUNTER — Emergency Department (HOSPITAL_COMMUNITY)
Admission: EM | Admit: 2012-10-29 | Discharge: 2012-10-29 | Disposition: A | Discharge status: Home / Self Care | Payer: Medicare Other | Attending: Emergency Medicine | Admitting: Emergency Medicine

## 2012-10-29 ENCOUNTER — Encounter (HOSPITAL_COMMUNITY): Payer: Self-pay | Admitting: Emergency Medicine

## 2012-10-29 DIAGNOSIS — Z7982 Long term (current) use of aspirin: Secondary | ICD-10-CM | POA: Diagnosis not present

## 2012-10-29 DIAGNOSIS — J449 Chronic obstructive pulmonary disease, unspecified: Secondary | ICD-10-CM | POA: Insufficient documentation

## 2012-10-29 DIAGNOSIS — I10 Essential (primary) hypertension: Secondary | ICD-10-CM | POA: Diagnosis not present

## 2012-10-29 DIAGNOSIS — Z872 Personal history of diseases of the skin and subcutaneous tissue: Secondary | ICD-10-CM | POA: Diagnosis not present

## 2012-10-29 DIAGNOSIS — Z79899 Other long term (current) drug therapy: Secondary | ICD-10-CM | POA: Insufficient documentation

## 2012-10-29 DIAGNOSIS — Z4800 Encounter for change or removal of nonsurgical wound dressing: Secondary | ICD-10-CM | POA: Insufficient documentation

## 2012-10-29 DIAGNOSIS — J45909 Unspecified asthma, uncomplicated: Secondary | ICD-10-CM | POA: Diagnosis not present

## 2012-10-29 DIAGNOSIS — Z862 Personal history of diseases of the blood and blood-forming organs and certain disorders involving the immune mechanism: Secondary | ICD-10-CM | POA: Insufficient documentation

## 2012-10-29 DIAGNOSIS — I2510 Atherosclerotic heart disease of native coronary artery without angina pectoris: Secondary | ICD-10-CM | POA: Insufficient documentation

## 2012-10-29 DIAGNOSIS — E662 Morbid (severe) obesity with alveolar hypoventilation: Secondary | ICD-10-CM | POA: Insufficient documentation

## 2012-10-29 DIAGNOSIS — G4733 Obstructive sleep apnea (adult) (pediatric): Secondary | ICD-10-CM | POA: Insufficient documentation

## 2012-10-29 DIAGNOSIS — Z87891 Personal history of nicotine dependence: Secondary | ICD-10-CM | POA: Diagnosis not present

## 2012-10-29 DIAGNOSIS — F3289 Other specified depressive episodes: Secondary | ICD-10-CM | POA: Insufficient documentation

## 2012-10-29 DIAGNOSIS — J4489 Other specified chronic obstructive pulmonary disease: Secondary | ICD-10-CM | POA: Insufficient documentation

## 2012-10-29 DIAGNOSIS — Z8669 Personal history of other diseases of the nervous system and sense organs: Secondary | ICD-10-CM | POA: Insufficient documentation

## 2012-10-29 DIAGNOSIS — N61 Mastitis without abscess: Secondary | ICD-10-CM

## 2012-10-29 DIAGNOSIS — Z853 Personal history of malignant neoplasm of breast: Secondary | ICD-10-CM | POA: Diagnosis not present

## 2012-10-29 DIAGNOSIS — F329 Major depressive disorder, single episode, unspecified: Secondary | ICD-10-CM | POA: Diagnosis not present

## 2012-10-29 DIAGNOSIS — E039 Hypothyroidism, unspecified: Secondary | ICD-10-CM | POA: Insufficient documentation

## 2012-10-29 DIAGNOSIS — Z8639 Personal history of other endocrine, nutritional and metabolic disease: Secondary | ICD-10-CM | POA: Insufficient documentation

## 2012-10-29 NOTE — ED Provider Notes (Signed)
Medical screening examination/treatment/procedure(s) were performed by non-physician practitioner and as supervising physician I was immediately available for consultation/collaboration.   Ezequiel Essex, MD 10/29/12 [phone]

## 2012-10-29 NOTE — ED Notes (Signed)
PT reports to the ED for eval of recheck of cellulitis of the left breast. Pt was seen recently and told to come back for a recheck because she has hx of left breast surgery and radiation in that breast. Pt reports that the pain, swelling, tenderness, and erythema has decreased significantly. Area still appears slightly reddened and warm to touch. Pt reports slight tenderness to the area. Pt denies any fever, chills, and N/V/D.

## 2012-10-29 NOTE — ED Provider Notes (Signed)
History     CSN: [id number]  Arrival date & time 10/29/12  1408   First MD Initiated Contact with Patient 10/29/12 1426      Chief Complaint  Patient presents with  . Wound Check    (Consider location/radiation/quality/duration/timing/severity/associated sxs/prior treatment) Patient is a 67 y.o. female presenting with wound check. The history is provided by the patient. No language interpreter was used.  Wound Check This is a new problem. The current episode started in the past 7 days. The problem occurs constantly. The problem has been gradually improving. Pertinent negatives include no abdominal pain, chest pain, chills, fatigue, fever, myalgias, nausea, numbness, rash, vomiting or weakness. Nothing aggravates the symptoms. Treatments tried: clindamycin. Improvement on treatment: moderate to significant.    Past Medical History  Diagnosis Date  . Hypertension   . Hyperlipidemia   . Anemia   . CAD (coronary artery disease)     a. Nonobstructive CAD 2007 (EF 75%.  RCA  proximal 75% tubular, 25% prox LAD, 25% d1, 50% D2) at [facility]. b. NSTEMI 01/2010 in setting of respiratory failure, medical approach (not a good candidate for noninvasive eval)  . OSA on CPAP   . Hypothyroidism   . Peripheral edema 06/13/2011  . Asthma 06/13/2011  . Depression 06/13/2011  . Neuropathy 06/13/2011  . Cervical radiculopathy 06/13/2011  . Gout 06/13/2011  . Obesity hypoventilation syndrome 06/13/2011  . COPD (chronic obstructive pulmonary disease)     on O2  . Breast cancer dx'd 2005    left  . Cellulitis     hx of cellulitis in left leg    Past Surgical History  Procedure Laterality Date  . Ovarian cyst removal    . Breast lumpectomy    . Cervical disc surgery      have cadaver bones,, screws, and plates   . Cervical biopsy  June 2013    at [facility] for Heavy  Bleeding  . Dilation and curettage of uterus    . Colonoscopy    . Laparoscopic appendectomy  03/29/2012    Procedure: APPENDECTOMY  LAPAROSCOPIC;  Surgeon: Adin Hector, MD;  Location: [facility];  Service: General;  Laterality: N/A;    Family History  Problem Relation Age of Onset  . Breast cancer    . Uterine cancer Mother   . Cancer Mother     Theadora Rama  . Heart disease Mother   . Cancer Sister 71    breast  . Diabetes Brother   . Cancer Maternal Aunt     breast  . Cancer Cousin 40    breast    History  Substance Use Topics  . Smoking status: Former Smoker -- 1.00 packs/day for 20 years    Types: Cigarettes    Quit date: 09/20/1998  . Smokeless tobacco: Never Used     Comment: 1ppd x 20 years  . Alcohol Use: Yes     Comment: rare    OB History   Grav Para Term Preterm Abortions TAB SAB Ect Mult Living                  Review of Systems  Constitutional: Negative for fever, chills and fatigue.  Respiratory: Negative for chest tightness and shortness of breath.   Cardiovascular: Negative for chest pain.  Gastrointestinal: Negative for nausea, vomiting and abdominal pain.  Musculoskeletal: Negative for myalgias.  Skin: Positive for color change. Negative for rash.       Color change = decreased erythema  Neurological: Negative  for weakness and numbness.       No tingling    Allergies  Codeine and Lisinopril  Home Medications   Current Outpatient Rx  Name  Route  Sig  Dispense  Refill  . albuterol (PROAIR HFA) 108 (90 BASE) MCG/ACT inhaler   Inhalation   Inhale 2 puffs into the lungs every 6 (six) hours as needed.           [name] allopurinol (ZYLOPRIM) 300 MG tablet   Oral   Take 300 mg by mouth daily.         [name] amitriptyline (ELAVIL) 100 MG tablet   Oral   Take 100 mg by mouth at bedtime.         [name] amLODipine (NORVASC) 2.5 MG tablet   Oral   Take 2.5 mg by mouth Daily.         [name] aspirin 81 MG tablet   Oral   Take 81 mg by mouth daily.           . Cholecalciferol (VITAMIN D3) 2000 UNITS TABS   Oral   Take 1 tablet by mouth daily.           . clindamycin (CLEOCIN)  300 MG capsule   Oral   Take 1 capsule (300 mg total) by mouth 4 (four) times daily.   40 capsule   0   . DULoxetine (CYMBALTA) 30 MG capsule   Oral   Take 60 mg by mouth daily.          . fish oil-omega-3 fatty acids 1000 MG capsule   Oral   Take 2 g by mouth daily.          . furosemide (LASIX) 40 MG tablet   Oral   Take 1 tablet (40 mg total) by mouth daily.   1 tablet      . levothyroxine (SYNTHROID, LEVOTHROID) 200 MCG tablet   Oral   Take 200 mcg by mouth daily.          [name] oxyCODONE-acetaminophen (PERCOCET/ROXICET) 5-325 MG per tablet   Oral   Take 2 tablets by mouth once.   30 tablet   0   . potassium chloride (KLOR-CON) 20 MEQ packet   Oral   Take 20 mEq by mouth 2 (two) times daily.         . vitamin C (ASCORBIC ACID) 500 MG tablet   Oral   Take 500 mg by mouth daily.             BP 143/63  Pulse 105  Temp(Src) 98.7 F (37.1 C) (Oral)  Resp 18  SpO2 98%  Physical Exam  Nursing note and vitals reviewed. Constitutional: She is oriented to person, place, and time. No distress.  Morbidly obese. Afebrile  HENT:  Head: Normocephalic and atraumatic.  Eyes: Conjunctivae are normal. No scleral icterus.  Neck: Normal range of motion.  Pulmonary/Chest: Effort normal.  Patient on 2L via nasal cannula  Neurological: She is alert and oriented to person, place, and time.  Skin: Skin is warm. No ecchymosis, no petechiae and no rash noted. She is not diaphoretic. There is erythema.  Patient's L breast mildly erythematous and some warmth to touch. Patient states these have both greatly improved since she began taking her prescribed antibiotic. No tenderness on palpation.     ED Course  Procedures (including critical care time)  Labs Reviewed - No data to display No results found.   1. Cellulitis of female breast  MDM  Patient with hx of breast cancer presents for check of cellulitis on L breast. Was seen in ED by Dr. Sharol Given two days ago  at which time she was exquisitely tender and febrile. Patient was started on clinda and has been taking it daily. Today patient afebrile with minimal tenderness on palpation. Area of cellulitis warm to touch; patient acknowledges this as well and states the warmth has significantly improved. Denies numbness, tingling and discharge. Patient has follow up scheduled with PCP on Wednesday. Have instructed patient to keep appt as well as to schedule an appt with her oncologist as instructed by Dr. Sharol Given. Patient told to return to ED if symptoms worsen and/or if fever develops.        Antonietta Breach, PA-C 10/29/12 1510

## 2012-10-29 NOTE — ED Notes (Signed)
Pt here for recheck of left breast cellulitis; pt sts feeling better and taking meds

## 2012-11-01 ENCOUNTER — Ambulatory Visit (INDEPENDENT_AMBULATORY_CARE_PROVIDER_SITE_OTHER): Payer: Medicare Other | Admitting: Internal Medicine

## 2012-11-01 ENCOUNTER — Encounter: Payer: Self-pay | Admitting: Internal Medicine

## 2012-11-01 ENCOUNTER — Other Ambulatory Visit (INDEPENDENT_AMBULATORY_CARE_PROVIDER_SITE_OTHER): Payer: Medicare Other

## 2012-11-01 VITALS — BP 120/70 | HR 99 | Temp 97.0°F

## 2012-11-01 DIAGNOSIS — K7200 Acute and subacute hepatic failure without coma: Secondary | ICD-10-CM | POA: Diagnosis not present

## 2012-11-01 DIAGNOSIS — R7309 Other abnormal glucose: Secondary | ICD-10-CM | POA: Diagnosis not present

## 2012-11-01 DIAGNOSIS — R7989 Other specified abnormal findings of blood chemistry: Secondary | ICD-10-CM | POA: Diagnosis not present

## 2012-11-01 DIAGNOSIS — E785 Hyperlipidemia, unspecified: Secondary | ICD-10-CM | POA: Diagnosis not present

## 2012-11-01 DIAGNOSIS — E039 Hypothyroidism, unspecified: Secondary | ICD-10-CM

## 2012-11-01 DIAGNOSIS — R945 Abnormal results of liver function studies: Secondary | ICD-10-CM

## 2012-11-01 DIAGNOSIS — R748 Abnormal levels of other serum enzymes: Secondary | ICD-10-CM

## 2012-11-01 DIAGNOSIS — B179 Acute viral hepatitis, unspecified: Secondary | ICD-10-CM

## 2012-11-01 DIAGNOSIS — R7302 Impaired glucose tolerance (oral): Secondary | ICD-10-CM

## 2012-11-01 DIAGNOSIS — R55 Syncope and collapse: Secondary | ICD-10-CM

## 2012-11-01 HISTORY — DX: Impaired glucose tolerance (oral): R73.02

## 2012-11-01 LAB — HEPATIC FUNCTION PANEL
AST: 28 U/L (ref 0–37)
Albumin: 3.5 g/dL (ref 3.5–5.2)
Alkaline Phosphatase: 85 U/L (ref 39–117)
Total Protein: 7.4 g/dL (ref 6.0–8.3)

## 2012-11-01 LAB — IBC PANEL
Iron: 39 ug/dL — ABNORMAL LOW (ref 42–145)
Transferrin: 266.7 mg/dL (ref 212.0–360.0)

## 2012-11-01 LAB — BASIC METABOLIC PANEL
BUN: 7 mg/dL (ref 6–23)
CO2: 30 mEq/L (ref 19–32)
Glucose, Bld: 97 mg/dL (ref 70–99)
Potassium: 3.1 mEq/L — ABNORMAL LOW (ref 3.5–5.1)
Sodium: 139 mEq/L (ref 135–145)

## 2012-11-01 LAB — T4, FREE: Free T4: 1.23 ng/dL (ref 0.60–1.60)

## 2012-11-01 LAB — SEDIMENTATION RATE: Sed Rate: 53 mm/hr — ABNORMAL HIGH (ref 0–22)

## 2012-11-01 MED ORDER — ALBUTEROL SULFATE HFA 108 (90 BASE) MCG/ACT IN AERS: 2.0000 | INHALATION_SPRAY | Freq: Four times a day (QID) | RESPIRATORY_TRACT | Status: DC | PRN

## 2012-11-01 NOTE — Patient Instructions (Signed)
Your albuterol Inhaler was refilled today Please continue all other medications as before, and refills have been done if requested. Please go to the LAB in the Basement (turn left off the elevator) for the tests to be done today You will be contacted by phone if any changes need to be made immediately.  Otherwise, you will receive a letter about your results with an explanation Please remember to sign up for My Chart if you have not done so, as this will be important to you in the future with finding out test results, communicating by private email, and scheduling acute appointments online when needed. Please return in 6 months, or sooner if needed

## 2012-11-03 LAB — ANA: Anti Nuclear Antibody(ANA): NEGATIVE

## 2012-11-03 LAB — CERULOPLASMIN: Ceruloplasmin: 36 mg/dL (ref 20–60)

## 2012-11-03 NOTE — Assessment & Plan Note (Signed)
stable overall by history and exam, recent data reviewed with pt, and pt to continue medical treatment as before,  to f/u any worsening symptoms or concerns Lab Results  Component Value Date   HGBA1C 6.1 11/01/2012

## 2012-11-03 NOTE — Progress Notes (Signed)
Subjective:    Patient ID: [name] [name], female    DOB: 1946-06-16, 67 y.o.   MRN: [id number]  HPI  Here after recent left breast/chest cellulitis after she now recalls she scratched to the area just prior to onset, without complication.  Did also have recent TSH abnormal during episode signficant illness, now due for f/u, currently on 200 mcg qd without recent change.  Denies hyper or hypo thyroid symptoms such as voice, skin or hair change.  Also,  Pt denies polydipsia, polyuria  Pt states overall good compliance with meds, trying to follow lower cholesterol diet, wt overall stable, but did have recent elev mild glucose.  Also noted on labs over the past yr is persistently elev LFT's, with persistent morbid obesity, no other fevers , known viral or other chronic infections, known malignancy other than breast, or autoimmune process.  Denies worsening reflux, abd pain, dysphagia, n/v, bowel change or blood Past Medical History  Diagnosis Date  . Hypertension   . Hyperlipidemia   . Anemia   . CAD (coronary artery disease)     a. Nonobstructive CAD 2007 (EF 75%.  RCA  proximal 75% tubular, 25% prox LAD, 25% d1, 50% D2) at [facility]. b. NSTEMI 01/2010 in setting of respiratory failure, medical approach (not a good candidate for noninvasive eval)  . OSA on CPAP   . Hypothyroidism   . Peripheral edema 06/13/2011  . Asthma 06/13/2011  . Depression 06/13/2011  . Neuropathy 06/13/2011  . Cervical radiculopathy 06/13/2011  . Gout 06/13/2011  . Obesity hypoventilation syndrome 06/13/2011  . COPD (chronic obstructive pulmonary disease)     on O2  . Breast cancer dx'd 2005    left  . Cellulitis     hx of cellulitis in left leg  . Impaired glucose tolerance 11/01/2012   Past Surgical History  Procedure Laterality Date  . Ovarian cyst removal    . Breast lumpectomy    . Cervical disc surgery      have cadaver bones,, screws, and plates   . Cervical biopsy  June 2013    at [facility] for Heavy  Bleeding  .  Dilation and curettage of uterus    . Colonoscopy    . Laparoscopic appendectomy  03/29/2012    Procedure: APPENDECTOMY LAPAROSCOPIC;  Surgeon: Adin Hector, MD;  Location: [facility];  Service: General;  Laterality: N/A;    reports that she quit smoking about 14 years ago. Her smoking use included Cigarettes. She has a 20 pack-year smoking history. She has never used smokeless tobacco. She reports that  drinks alcohol. She reports that she does not use illicit drugs. family history includes Breast cancer in an unspecified family member; Cancer in her maternal aunt and mother; Cancer (age of onset: 22) in her sister; Cancer (age of onset: 32) in her cousin; Diabetes in her brother; Heart disease in her mother; and Uterine cancer in her mother. Allergies  Allergen Reactions  . Codeine     REACTION: unknown - pt. states unaware of having reaction to codeine  . Lisinopril     REACTION: cough   Current Outpatient Prescriptions on File Prior to Visit  Medication Sig Dispense Refill  . allopurinol (ZYLOPRIM) 300 MG tablet Take 300 mg by mouth daily.      [name] amitriptyline (ELAVIL) 100 MG tablet Take 100 mg by mouth at bedtime.      [name] amLODipine (NORVASC) 2.5 MG tablet Take 2.5 mg by mouth Daily.      [name]  aspirin 81 MG tablet Take 81 mg by mouth daily.        . Cholecalciferol (VITAMIN D3) 2000 UNITS TABS Take 1 tablet by mouth daily.        . clindamycin (CLEOCIN) 300 MG capsule Take 1 capsule (300 mg total) by mouth 4 (four) times daily.  40 capsule  0  . DULoxetine (CYMBALTA) 30 MG capsule Take 60 mg by mouth daily.       . fish oil-omega-3 fatty acids 1000 MG capsule Take 2 g by mouth daily.       . furosemide (LASIX) 40 MG tablet Take 1 tablet (40 mg total) by mouth daily.  1 tablet    . levothyroxine (SYNTHROID, LEVOTHROID) 200 MCG tablet Take 200 mcg by mouth daily.       [name] oxyCODONE-acetaminophen (PERCOCET/ROXICET) 5-325 MG per tablet Take 2 tablets by mouth once.  30 tablet  0  . potassium  chloride (KLOR-CON) 20 MEQ packet Take 20 mEq by mouth 2 (two) times daily.      . vitamin C (ASCORBIC ACID) 500 MG tablet Take 500 mg by mouth daily.         No current facility-administered medications on file prior to visit.   Review of Systems  Constitutional: Negative for unexpected weight change, or unusual diaphoresis  HENT: Negative for tinnitus.   Eyes: Negative for photophobia and visual disturbance.  Respiratory: Negative for choking and stridor.   Gastrointestinal: Negative for vomiting and blood in stool.  Genitourinary: Negative for hematuria and decreased urine volume.  Musculoskeletal: Negative for acute joint swelling Skin: Negative for color change and wound.  Neurological: Negative for tremors and numbness other than noted  Psychiatric/Behavioral: Negative for decreased concentration or  hyperactivity.       Objective:   Physical Exam BP 120/70  Pulse 99  Temp(Src) 97 F (36.1 C) (Oral) VS noted, uses her home o2 Constitutional: Pt appears well-developed and well-nourished. /morbid obese HENT: Head: NCAT.  Right Ear: External ear normal.  Left Ear: External ear normal.  Eyes: Conjunctivae and EOM are normal. Pupils are equal, round, and reactive to light.  Neck: Normal range of motion. Neck supple.  Cardiovascular: Normal rate and regular rhythm.   Pulmonary/Chest: Effort normal and breath sounds decreased, no wheezing.  Abd:  Soft, NT, non-distended, + BS Neurological: Pt is alert. Not confused , motor intact Skin: Skin is warm. No erythema. No rash Psychiatric: Pt behavior is normal. Thought content normal.     Assessment & Plan:

## 2012-11-03 NOTE — Assessment & Plan Note (Signed)
stable overall by history and exam, recent data reviewed with pt, and pt to continue medical treatment as before,  to f/u any worsening symptoms or concerns, for f/u TSH today

## 2012-11-03 NOTE — Assessment & Plan Note (Signed)
stable overall by history and exam, recent data reviewed with pt, and pt to continue medical treatment as before,  to f/u any worsening symptoms or concerns Lab Results  Component Value Date   LDLCALC 89 10/05/2012

## 2012-11-03 NOTE — Assessment & Plan Note (Signed)
Unclear etiology, most likely related to NASH given her obesity, but will also eval with acute hep panel, alpha-1-antitrypsin, ceruloplasmin, ANA, ers, iron panel; did have CT abd/pelvic July 2013 with abnl LFT's prior, so will hold on further imaging, will consider GI referral which pt declines for now

## 2012-11-06 ENCOUNTER — Encounter: Payer: Self-pay | Admitting: Internal Medicine

## 2012-11-10 ENCOUNTER — Other Ambulatory Visit: Payer: Self-pay | Admitting: Internal Medicine

## 2012-11-30 ENCOUNTER — Telehealth: Payer: Self-pay | Admitting: Medical Oncology

## 2012-11-30 ENCOUNTER — Other Ambulatory Visit: Payer: Self-pay | Admitting: Medical Oncology

## 2012-11-30 NOTE — Telephone Encounter (Signed)
Pt had called and left a message that she had to go to the ED with cellulitis of her left breast. She was told to follow up with Dr. Ralene Ok due to her history. I reviewed the ED note and saw where she was given clindamycin and returned 2 days later for a recheck of the cellulitis.I asked her how she is doing and she states the cellulitis is gone.Per Dr. Ralene Ok we will make her an appointment. I informed her that I placed a POF to make her an appointment with Dr. Ralene Ok in April. Pt will call if any further problems. She states she did have  Mammogram this year and it was normal.

## 2012-12-27 ENCOUNTER — Other Ambulatory Visit: Payer: Self-pay | Admitting: Medical Oncology

## 2012-12-28 ENCOUNTER — Ambulatory Visit (HOSPITAL_BASED_OUTPATIENT_CLINIC_OR_DEPARTMENT_OTHER): Payer: Medicare Other | Admitting: Physician Assistant

## 2012-12-28 ENCOUNTER — Telehealth: Payer: Self-pay | Admitting: Oncology

## 2012-12-28 ENCOUNTER — Other Ambulatory Visit (HOSPITAL_BASED_OUTPATIENT_CLINIC_OR_DEPARTMENT_OTHER): Payer: Medicare Other | Admitting: Lab

## 2012-12-28 VITALS — BP 141/54 | HR 100 | Temp 97.6°F | Resp 16 | Ht 65.5 in | Wt 339.2 lb

## 2012-12-28 DIAGNOSIS — N644 Mastodynia: Secondary | ICD-10-CM | POA: Diagnosis not present

## 2012-12-28 DIAGNOSIS — Z853 Personal history of malignant neoplasm of breast: Secondary | ICD-10-CM

## 2012-12-28 DIAGNOSIS — C50919 Malignant neoplasm of unspecified site of unspecified female breast: Secondary | ICD-10-CM

## 2012-12-28 LAB — CBC WITH DIFFERENTIAL/PLATELET
BASO%: 0.2 % (ref 0.0–2.0)
Basophils Absolute: 0 10*3/uL (ref 0.0–0.1)
EOS%: 5.4 % (ref 0.0–7.0)
HGB: 13.3 g/dL (ref 11.6–15.9)
MCH: 28.3 pg (ref 25.1–34.0)
MCHC: 32.1 g/dL (ref 31.5–36.0)
MCV: 88.1 fL (ref 79.5–101.0)
MONO%: 5.9 % (ref 0.0–14.0)
RBC: 4.7 10*6/uL (ref 3.70–5.45)
RDW: 15 % — ABNORMAL HIGH (ref 11.2–14.5)

## 2012-12-28 LAB — COMPREHENSIVE METABOLIC PANEL (CC13)
AST: 40 U/L — ABNORMAL HIGH (ref 5–34)
Albumin: 3.7 g/dL (ref 3.5–5.0)
Alkaline Phosphatase: 101 U/L (ref 40–150)
BUN: 8.8 mg/dL (ref 7.0–26.0)
Potassium: 3.2 mEq/L — ABNORMAL LOW (ref 3.5–5.1)

## 2012-12-28 NOTE — Progress Notes (Signed)
[place]  [phone]    OFFICE PROGRESS NOTE  CC to Dr. Stanford Breed, Cardiology           Dr. Elsworth Soho, Pulmonary            57. 67 year old African-American female whom I am asked to see in consultation for ongoing followup of her history of infiltrating ductal carcinoma of the left breast dating back to August 2006 by Dr. Micah Noel.  Mrs. Halliday had been living in [place] and basically received all of her care for her breast cancer at [facility].  She apparently found a lump in her left breast in the summer of 2006 and without delay had a mammogram, which disclosed a mass apparently in the upper inner quadrant of the left breast.  Tumor apparently was palpable measuring 1.9 cm.  She had a core needle biopsy on 05/05/2005, which showed grade 3 histology.  Tumor was triple negative.  She underwent wire localization with wide partial mastectomy on 06/03/2005.  Also seven sentinel lymph nodes were negative.  The tumor measured 1.1 x 0.8 cm again was grade 3.  The patient received adjuvant chemotherapy initially with four cycles of Adriamycin and Cytoxan and then weekly Taxotere.  Apparently, the patient developed some neuropathy and the Taxol was switched to Taxotere.  chemotherapy treatments were completed in late April 2007.  The patient then received radiation treatments from April through June 2007.  The patient moved to [facility] and established  all of her medical care here back in 12/2010. . 2.  History of hypertension 3. Dyslipidemia. 4. Severe degenerative disk disease with cervical neuropathy with numbness in her fingers and hands 5.Coronary artery disease.  Apparently, she had a myocardial infarction in May 2011 and was hospitalized at [facility].  6.Morbid obesity, 7. Recurrent cellulitis possibly superficial phlebitis particularly involving her left leg.  8. COPD and has been on oxygen 2 liters per minute since May 2011 and has obstructive sleep apnea for which she is  on BiPAP.   9. The patient also thinks that she has some slowing of her cognitive processes due to chemotherapy. 10.Left breat cellulitis inJanuary 2014 11. Depression 12. Large fibroid, 7 cm per CT of A/P 03/28/12 13. Acute appendicitis on 03/2012 14 Involves an ovarian cyst in 1974, a laparotomy with removal of an ovarian cyst in 1974, and surgery on her neck in 2005.  Her breast cancer surgery in September 2005, surgery on her left eye in 2006, and D&C in October 2011 for postmenopausal bleeding.  No history of any injuries.  No history of blood transfusions. 15. Hypothyroidism. 16.Impaired glucose intolerance.  MEDICATIONS:  Current Outpatient Prescriptions  Medication Sig Dispense Refill  . albuterol (PROAIR HFA) 108 (90 BASE) MCG/ACT inhaler Inhale 2 puffs into the lungs every 6 (six) hours as needed.  1 Inhaler  11  . allopurinol (ZYLOPRIM) 300 MG tablet Take 300 mg by mouth daily.      [name] amitriptyline (ELAVIL) 100 MG tablet Take 100 mg by mouth at bedtime.      [name] amLODipine (NORVASC) 2.5 MG tablet Take 2.5 mg by mouth Daily.      [name] aspirin 81 MG tablet Take 81 mg by mouth daily.        . Cholecalciferol (VITAMIN D3) 2000 UNITS TABS Take 1 tablet by mouth daily.        . clindamycin (CLEOCIN) 300 MG capsule Take 1 capsule (300 mg total) by mouth 4 (four) times daily.  [place]  capsule  0  . DULoxetine (CYMBALTA) 30 MG capsule Take 60 mg by mouth daily.       . fish oil-omega-3 fatty acids 1000 MG capsule Take 2 g by mouth daily.       . furosemide (LASIX) 40 MG tablet Take 1 tablet (40 mg total) by mouth daily.  1 tablet    . KLOR-CON M20 20 MEQ tablet       . levothyroxine (SYNTHROID, LEVOTHROID) 200 MCG tablet Take 200 mcg by mouth daily.       [name] levothyroxine (SYNTHROID, LEVOTHROID) 200 MCG tablet TAKE 2 TABLETS DAILY  180 tablet  3  . oxyCODONE-acetaminophen (PERCOCET/ROXICET) 5-325 MG per tablet Take 2 tablets by mouth once.  30 tablet  0  . potassium chloride (KLOR-CON) 20 MEQ packet  Take 20 mEq by mouth 2 (two) times daily.      . vitamin C (ASCORBIC ACID) 500 MG tablet Take 500 mg by mouth daily.         No current facility-administered medications for this visit.    ALLERGIES:   Allergies  Allergen Reactions  . Codeine     REACTION: unknown - pt. states unaware of having reaction to codeine  . Lisinopril     REACTION: cough    Smoking history: she smoked a pack of cigarettes a day from age 1 10/28/1948, no further use of tobacco.  For social and family history please refer to the well detailed note from 01/05/2011 by Dr. Ralene Ok   Ms. Deike name followup evaluation after a long absence from followup to our office. She was initially seen on 01/05/2011, which was her last visit as well. At the time, she had been instructed to return in 3 months , however, she lost to followup. Recently, the patient experienced cellulitis in the left breast, which was treated with Keflex. About 2 days ago, the patient began to experience superficial skin tenderness in the same region. She got concerned, that could possibly be breast cancer recurrence . However her most recent mammogram on 05/09/2012 was negative her malignancy. Recent chest x-ray on 10/27/2012 showed no malignancy either. LDH is 205. CBC is normal. Chemistries are pending.it is unlikely that breast cancer recurrence is present. She has a multitude of other medical issues, followed by the appropriate physicians . From the oncology standpoint, she verbalizes no other complaints.  PHYSICAL EXAMINATION:   Filed Vitals:   12/28/12 1048  BP: 141/54  Pulse: 100  Temp: 97.6 F (36.4 C)  Resp: 16   Filed Weights   12/28/12 1048  Weight: 339 lb 3.2 oz (153.86 kg)     She is morbidly obese No scleral icterus.  Pupillary and extraocular movements are normal.   Mouth and pharynx are benign.  Dentition is fairly fairly well preserved.  Neck is without adenopathy, thyroid enlargement or bruit.  Heart and lungs are normal.   Breasts are quite large.  The right breast is benign, and pendulous.  The left breast shows the effects of radiation and surgery and is foreshortened.  There are a lot of induration and some edema of the breast.  There is hyperpigmentation and particularly some induration in the upper inner quadrant of the breast, but no suspicious findings and certainly nothing to suggest recurrent breast cancer. No visible areas of drainage at the region treated for cellulitis.   No axillary adenopathy.  Back, no skeletal tenderness or deformity.  Abdomen is massively obese and nontender with no organomegaly or masses palpable.  Extremities, marked stasis changes, and chronic edema with some pitting.  Neurologic exam is grossly normal.  The patient is able to walk with a walker.  I should mention that she does have oxygen nasal O2 2 liters per minute. O sats 96 percent  No obvious lymphedema of either arm.  LABORATORY/RADIOLOGY DATA:   Recent Labs Lab 12/28/12 0929  WBC 5.4  HGB 13.3  HCT 41.4  PLT 164  MCV 88.1  MCH 28.3  MCHC 32.1  RDW 15.0*  LYMPHSABS 1.6  MONOABS 0.3  EOSABS 0.3  BASOSABS 0.0    CMP   No results found for this basename: NA, K, CL, CO2, GLUCOSE, BUN, CREATININE, GFRCGP, CALCIUM, MG, AST, ALT, ALKPHOS, BILITOT,  in the last 168 hours      Component Value Date/Time   BILITOT 0.4 11/01/2012 1013   BILIDIR 0.1 11/01/2012 1013   IBILI 0.5 03/27/2012 1846      Radiology Studies:   1.Bbilateral mammogram on  05/09/12, negative  2.Chest x-ray on 10/27/12 negative for metastatic disease.   3.CT scan of the chest angiogram on 02/15/2010  4.CT scans of the chest, abdomen, and pelvis on 08/22/2009, all of which has been negative.  5.CT of the head on 04/01/12 was negative.  6.CT of the abdomen and pelvis on 03/28/12 was remarkable for a large fibroid and appendicitis.     ASSESSMENT AND PLAN:   Mrs. Ficken was diagnosed with T2 N0, stage IIA triple negative infiltrating ductal carcinoma  of the left breast with grade 3 histology in the summer of 2006. She is triple negative. She underwent adjuvant AC x4, and then weekly Taxotere completed on April of 2007. She then received radiation treatments April through June of 2007 all these treatments were performed at [facility].  She is now approaching eight years from the time of diagnosis without evidence of disease.  Her main issue is that she needs to be compliant with her follow up visits.Dr. Ralene Ok, has seen and evaluated the patient and reviewed her  chart has once again explained the importance of close monitoring due to risk of recurrence.she has been initially offered to return every 3 months, but patient declined and wishes to return in a 6 month basis. She  needs to have screening mammograms in the interim.[name]  She will need to have chest x-ray once a year, which we can get in October. She is on no therapy at this time.  There is no evidence for recurrent disease. Her left breast pain is likely to be secondary to cellulitis recently treated with antibiotics, and needs to be followed back by her primary  physician..  We will plan to see her again in six months with Dr. Ralene Ok We will check CBC and chemistries with LDH.  The patient knows to call as if she has any questions or concerns in the interim.    Sharene Butters E, PA-C 12/28/2012, 3:38 PM

## 2012-12-28 NOTE — Telephone Encounter (Signed)
gv pt appt schedule for October.

## 2012-12-29 ENCOUNTER — Telehealth: Payer: Self-pay

## 2012-12-29 NOTE — Telephone Encounter (Signed)
Labs faxed to Dr Stanford Breed per Dr Ralene Ok. Pt made aware that potassium is low at 3.2 and labs faxed.

## 2013-01-06 ENCOUNTER — Other Ambulatory Visit: Payer: Self-pay | Admitting: Nurse Practitioner

## 2013-01-06 DIAGNOSIS — C50919 Malignant neoplasm of unspecified site of unspecified female breast: Secondary | ICD-10-CM

## 2013-01-06 MED ORDER — POTASSIUM CHLORIDE CRYS ER 20 MEQ PO TBCR: 20.0000 meq | EXTENDED_RELEASE_TABLET | Freq: Every day | ORAL | Status: DC

## 2013-01-09 ENCOUNTER — Other Ambulatory Visit: Payer: Self-pay | Admitting: Internal Medicine

## 2013-02-02 ENCOUNTER — Ambulatory Visit: Payer: Medicare Other | Admitting: Cardiology

## 2013-02-19 ENCOUNTER — Ambulatory Visit: Payer: Medicare Other | Admitting: Cardiology

## 2013-03-01 ENCOUNTER — Encounter: Payer: Self-pay | Admitting: Internal Medicine

## 2013-03-01 ENCOUNTER — Ambulatory Visit (INDEPENDENT_AMBULATORY_CARE_PROVIDER_SITE_OTHER): Payer: Medicare Other | Admitting: Internal Medicine

## 2013-03-01 ENCOUNTER — Other Ambulatory Visit: Payer: Self-pay | Admitting: Internal Medicine

## 2013-03-01 ENCOUNTER — Other Ambulatory Visit (INDEPENDENT_AMBULATORY_CARE_PROVIDER_SITE_OTHER): Payer: Medicare Other

## 2013-03-01 ENCOUNTER — Ambulatory Visit (INDEPENDENT_AMBULATORY_CARE_PROVIDER_SITE_OTHER)
Admission: RE | Admit: 2013-03-01 | Discharge: 2013-03-01 | Disposition: A | Discharge status: Home / Self Care | Payer: Medicare Other | Source: Ambulatory Visit | Attending: Internal Medicine | Admitting: Internal Medicine

## 2013-03-01 VITALS — BP 116/90 | HR 133 | Temp 98.3°F | Ht 65.5 in | Wt 342.0 lb

## 2013-03-01 DIAGNOSIS — Z7901 Long term (current) use of anticoagulants: Secondary | ICD-10-CM | POA: Insufficient documentation

## 2013-03-01 DIAGNOSIS — E0590 Thyrotoxicosis, unspecified without thyrotoxic crisis or storm: Secondary | ICD-10-CM | POA: Diagnosis not present

## 2013-03-01 DIAGNOSIS — I4892 Unspecified atrial flutter: Secondary | ICD-10-CM

## 2013-03-01 DIAGNOSIS — R000 Tachycardia, unspecified: Secondary | ICD-10-CM

## 2013-03-01 DIAGNOSIS — R05 Cough: Secondary | ICD-10-CM | POA: Diagnosis not present

## 2013-03-01 LAB — URINALYSIS, ROUTINE W REFLEX MICROSCOPIC
Bilirubin Urine: NEGATIVE
Nitrite: POSITIVE
Specific Gravity, Urine: 1.02 (ref 1.000–1.030)
Total Protein, Urine: 30
pH: 6 (ref 5.0–8.0)

## 2013-03-01 LAB — BASIC METABOLIC PANEL
BUN: 8 mg/dL (ref 6–23)
Chloride: 103 mEq/L (ref 96–112)
Creatinine, Ser: 0.9 mg/dL (ref 0.4–1.2)
GFR: 81.45 mL/min (ref >60.00)
Glucose, Bld: 99 mg/dL (ref 70–99)

## 2013-03-01 LAB — T4, FREE: Free T4: 1.19 ng/dL (ref 0.60–1.60)

## 2013-03-01 LAB — CBC WITH DIFFERENTIAL/PLATELET
Basophils Relative: 0.5 % (ref 0.0–3.0)
Eosinophils Relative: 4.5 % (ref 0.0–5.0)
MCV: 91.9 fl (ref 78.0–100.0)
Monocytes Absolute: 0.5 10*3/uL (ref 0.1–1.0)
Monocytes Relative: 8.4 % (ref 3.0–12.0)
Neutrophils Relative %: 48.1 % (ref 43.0–77.0)
RBC: 4.52 Mil/uL (ref 3.87–5.11)
WBC: 6 10*3/uL (ref 4.5–10.5)

## 2013-03-01 LAB — HEPATIC FUNCTION PANEL
ALT: 51 U/L — ABNORMAL HIGH (ref 0–35)
Albumin: 3.6 g/dL (ref 3.5–5.2)
Total Bilirubin: 0.8 mg/dL (ref 0.3–1.2)

## 2013-03-01 MED ORDER — WARFARIN SODIUM 5 MG PO TABS: ORAL_TABLET | ORAL | Status: DC

## 2013-03-01 MED ORDER — DILTIAZEM HCL ER 240 MG PO CP24: 240.0000 mg | ORAL_CAPSULE | Freq: Every day | ORAL | Status: DC

## 2013-03-01 MED ORDER — CEPHALEXIN 500 MG PO CAPS: 500.0000 mg | ORAL_CAPSULE | Freq: Four times a day (QID) | ORAL | Status: DC

## 2013-03-01 NOTE — Assessment & Plan Note (Addendum)
ECG reviewed as per emr, new onset, by hx approx 10 days, has hx of syncope related to metoprolol use in past 2013, will add diltiazem for hopeful  Rate control, volume ok, for echo, card referral, f/u here in 1 wk if needed, for labs today including TSH, cbc, K  Note:  Total time for pt hx, exam, review of record with pt in the room, determination of diagnoses and plan for further eval and tx is > 40 min, with over 50% spent in coordination and counseling of patient

## 2013-03-01 NOTE — Progress Notes (Signed)
Subjective:    Patient ID: [name] [name], female    DOB: Aug 29, 1946, 67 y.o.   MRN: [id number]  HPI  Here with c/o 10 days intermittent palps assoc with home pulse Ox with elev HR and slightly elev BP for her more than usual, about 138/88.  Feels occasionally dizzy, mild weakness, but Pt denies chest pain, increased sob or doe, wheezing, orthopnea, PND, increased LE swelling, or syncope.  Has recent low K persistent, asks for f/u lab, as well as Hgb with recent anemia.  Pt denies new neurological symptoms such as new headache, or facial or extremity weakness or numbness   Pt denies polydipsia, polyuria.  Pt denies fever, wt loss, night sweats, loss of appetite, or other constitutional symptoms Past Medical History  Diagnosis Date  . Hypertension   . Hyperlipidemia   . Anemia   . CAD (coronary artery disease)     a. Nonobstructive CAD 2007 (EF 75%.  RCA  proximal 75% tubular, 25% prox LAD, 25% d1, 50% D2) at [facility]. b. NSTEMI 01/2010 in setting of respiratory failure, medical approach (not a good candidate for noninvasive eval)  . OSA on CPAP   . Hypothyroidism   . Peripheral edema 06/13/2011  . Asthma 06/13/2011  . Depression 06/13/2011  . Neuropathy 06/13/2011  . Cervical radiculopathy 06/13/2011  . Gout 06/13/2011  . Obesity hypoventilation syndrome 06/13/2011  . COPD (chronic obstructive pulmonary disease)     on O2  . Breast cancer dx'd 2005    left  . Cellulitis     hx of cellulitis in left leg  . Impaired glucose tolerance 11/01/2012   Past Surgical History  Procedure Laterality Date  . Ovarian cyst removal    . Breast lumpectomy    . Cervical disc surgery      have cadaver bones,, screws, and plates   . Cervical biopsy  June 2013    at [facility] for Heavy  Bleeding  . Dilation and curettage of uterus    . Colonoscopy    . Laparoscopic appendectomy  03/29/2012    Procedure: APPENDECTOMY LAPAROSCOPIC;  Surgeon: Adin Hector, MD;  Location: [facility];  Service: General;  Laterality: N/A;     reports that she quit smoking about 14 years ago. Her smoking use included Cigarettes. She has a 20 pack-year smoking history. She has never used smokeless tobacco. She reports that  drinks alcohol. She reports that she does not use illicit drugs. family history includes Breast cancer in an unspecified family member; Cancer in her maternal aunt and mother; Cancer (age of onset: 40) in her sister; Cancer (age of onset: 6) in her cousin; Diabetes in her brother; Heart disease in her mother; and Uterine cancer in her mother. Allergies  Allergen Reactions  . Codeine     REACTION: unknown - pt. states unaware of having reaction to codeine  . Lisinopril     REACTION: cough   Current Outpatient Prescriptions on File Prior to Visit  Medication Sig Dispense Refill  . albuterol (PROAIR HFA) 108 (90 BASE) MCG/ACT inhaler Inhale 2 puffs into the lungs every 6 (six) hours as needed.  1 Inhaler  11  . allopurinol (ZYLOPRIM) 300 MG tablet Take 300 mg by mouth daily.      [name] amitriptyline (ELAVIL) 100 MG tablet Take 100 mg by mouth at bedtime.      [name] aspirin 81 MG tablet Take 81 mg by mouth daily.        . Cholecalciferol (VITAMIN D3) 2000  UNITS TABS Take 1 tablet by mouth daily.        . DULoxetine (CYMBALTA) 30 MG capsule Take 60 mg by mouth daily.       . fish oil-omega-3 fatty acids 1000 MG capsule Take 2 g by mouth daily.       . furosemide (LASIX) 40 MG tablet Take 1 tablet (40 mg total) by mouth daily.  1 tablet    . levothyroxine (SYNTHROID, LEVOTHROID) 200 MCG tablet Take 200 mcg by mouth daily.       [name] levothyroxine (SYNTHROID, LEVOTHROID) 200 MCG tablet TAKE 2 TABLETS DAILY  180 tablet  3  . oxyCODONE-acetaminophen (PERCOCET/ROXICET) 5-325 MG per tablet Take 2 tablets by mouth once.  30 tablet  0  . potassium chloride (KLOR-CON) 20 MEQ packet Take 20 mEq by mouth 2 (two) times daily.      . potassium chloride SA (KLOR-CON M20) 20 MEQ tablet Take 1 tablet (20 mEq total) by mouth daily.  30  tablet  2  . vitamin C (ASCORBIC ACID) 500 MG tablet Take 500 mg by mouth daily.         No current facility-administered medications on file prior to visit.   Review of Systems  Constitutional: Negative for unexpected weight change, or unusual diaphoresis  HENT: Negative for tinnitus.   Eyes: Negative for photophobia and visual disturbance.  Respiratory: Negative for choking and stridor.   Gastrointestinal: Negative for vomiting and blood in stool.  Genitourinary: Negative for hematuria and decreased urine volume.  Musculoskeletal: Negative for acute joint swelling Skin: Negative for color change and wound.  Neurological: Negative for tremors and numbness other than noted  Psychiatric/Behavioral: Negative for decreased concentration or  hyperactivity.       Objective:   Physical Exam BP 116/90  Pulse 133  Temp(Src) 98.3 F (36.8 C) (Oral)  Ht 5' 5.5" (1.664 m)  Wt 342 lb (155.13 kg)  BMI 56.03 kg/m2  SpO2 93% VS noted,not ill appaering but vaguely uncomfortable Constitutional: Pt appears morbid obese  HENT: Head: NCAT.  Right Ear: External ear normal.  Left Ear: External ear normal.  Eyes: Conjunctivae and EOM are normal. Pupils are equal, round, and reactive to light.  Neck: Normal range of motion. Neck supple.  Cardiovascular: tachy rate and irregular rhythm.   Pulmonary/Chest: Effort normal and breath sounds decreased, no rales or wheezing.  Abd:  Soft, NT, non-distended, + BS Neurological: Pt is alert. Not confused  Skin: Skin is warm. No erythema. No LE edema Psychiatric: Pt behavior is normal. Thought content normal. mild nervous  ECG - aflutter , 130, no acute changes    Assessment & Plan:

## 2013-03-01 NOTE — Patient Instructions (Addendum)
Please take all new medication as prescribed - the diltiazem, and the coumadin blood thinner Please take 1 and a Half (= 1.5) tablets (this would be 7.5 mg per day) until seen Monday June 16 at our coumadin clinic Please continue all other medications as before, except to STOP taking any Norvasc (amlodipine) You will be contacted regarding the referral for: cardiology, as well with [place] Herb who works in the [facility] (to see [facility] now) Please go to the XRAY Department in the Basement (go straight as you get off the elevator) for the x-ray testing Please go to the LAB in the Basement (turn left off the elevator) for the tests to be done today You will be contacted by phone if any changes need to be made immediately.  Otherwise, you will receive a letter about your results with an explanation  Please remember to sign up for My Chart if you have not done so, as this will be important to you in the future with finding out test results, communicating by private email, and scheduling acute appointments online when needed.  Please return in august as planned, or sooner if needed, with Lab testing done 3-5 days before  OR, you should come back here in 1 wk if not able to be seen by cardiology

## 2013-03-01 NOTE — Assessment & Plan Note (Signed)
For coumadin start at 7.5 qd, INR today for baseline, then f/u INR in 3 days, may need long term coumadin tx

## 2013-03-01 NOTE — Assessment & Plan Note (Signed)
ECG reviewed as per emr,

## 2013-03-05 ENCOUNTER — Telehealth: Payer: Self-pay

## 2013-03-05 DIAGNOSIS — C50919 Malignant neoplasm of unspecified site of unspecified female breast: Secondary | ICD-10-CM

## 2013-03-05 MED ORDER — POTASSIUM CHLORIDE CRYS ER 20 MEQ PO TBCR: 20.0000 meq | EXTENDED_RELEASE_TABLET | Freq: Every day | ORAL | Status: DC

## 2013-03-05 NOTE — Telephone Encounter (Signed)
Done erx 

## 2013-03-05 NOTE — Telephone Encounter (Signed)
Patient needs a refill on potassium.  Medication list it is listed twice and instructions are not the same please advise on correct instructions.

## 2013-03-06 ENCOUNTER — Ambulatory Visit (INDEPENDENT_AMBULATORY_CARE_PROVIDER_SITE_OTHER): Payer: Medicare Other | Admitting: General Practice

## 2013-03-06 DIAGNOSIS — R000 Tachycardia, unspecified: Secondary | ICD-10-CM | POA: Diagnosis not present

## 2013-03-06 DIAGNOSIS — Z7901 Long term (current) use of anticoagulants: Secondary | ICD-10-CM

## 2013-03-06 DIAGNOSIS — I4892 Unspecified atrial flutter: Secondary | ICD-10-CM | POA: Diagnosis not present

## 2013-03-06 LAB — POCT INR: INR: 1.4

## 2013-03-09 ENCOUNTER — Ambulatory Visit (INDEPENDENT_AMBULATORY_CARE_PROVIDER_SITE_OTHER): Payer: Medicare Other | Admitting: Internal Medicine

## 2013-03-09 ENCOUNTER — Encounter: Payer: Self-pay | Admitting: Internal Medicine

## 2013-03-09 VITALS — BP 102/72 | HR 126 | Temp 97.4°F | Ht 65.5 in | Wt 339.5 lb

## 2013-03-09 DIAGNOSIS — I10 Essential (primary) hypertension: Secondary | ICD-10-CM | POA: Diagnosis not present

## 2013-03-09 DIAGNOSIS — I4892 Unspecified atrial flutter: Secondary | ICD-10-CM

## 2013-03-09 DIAGNOSIS — J449 Chronic obstructive pulmonary disease, unspecified: Secondary | ICD-10-CM

## 2013-03-09 DIAGNOSIS — Z7901 Long term (current) use of anticoagulants: Secondary | ICD-10-CM

## 2013-03-09 MED ORDER — DILTIAZEM HCL ER COATED BEADS 360 MG PO CP24: 360.0000 mg | ORAL_CAPSULE | Freq: Every day | ORAL | Status: DC

## 2013-03-09 NOTE — Progress Notes (Signed)
Subjective:    Patient ID: [name] [name], female    DOB: 1945-12-04, 67 y.o.   MRN: [id number]  HPI Here to f/u, overall stable it seems - Overall good compliance with treatment, and good medicine tolerability, and Pt denies chest pain, increased sob or doe, wheezing, orthopnea, PND, increased LE swelling, palpitations, dizziness or syncope.  Able to walk in from parking lot without stopping to rest or doe on arrival.  No falls.  Pt denies fever, wt loss, night sweats, loss of appetite, or other constitutional symptoms  Recent cxr neg for acute.  Recent INR 1.4, med adjusted and has f/u appt.  No overt bleeding or bruising Past Medical History  Diagnosis Date  . Hypertension   . Hyperlipidemia   . Anemia   . CAD (coronary artery disease)     a. Nonobstructive CAD 2007 (EF 75%.  RCA  proximal 75% tubular, 25% prox LAD, 25% d1, 50% D2) at [facility]. b. NSTEMI 01/2010 in setting of respiratory failure, medical approach (not a good candidate for noninvasive eval)  . OSA on CPAP   . Hypothyroidism   . Peripheral edema 06/13/2011  . Asthma 06/13/2011  . Depression 06/13/2011  . Neuropathy 06/13/2011  . Cervical radiculopathy 06/13/2011  . Gout 06/13/2011  . Obesity hypoventilation syndrome 06/13/2011  . COPD (chronic obstructive pulmonary disease)     on O2  . Breast cancer dx'd 2005    left  . Cellulitis     hx of cellulitis in left leg  . Impaired glucose tolerance 11/01/2012   Past Surgical History  Procedure Laterality Date  . Ovarian cyst removal    . Breast lumpectomy    . Cervical disc surgery      have cadaver bones,, screws, and plates   . Cervical biopsy  June 2013    at [facility] for Heavy  Bleeding  . Dilation and curettage of uterus    . Colonoscopy    . Laparoscopic appendectomy  03/29/2012    Procedure: APPENDECTOMY LAPAROSCOPIC;  Surgeon: Adin Hector, MD;  Location: [facility];  Service: General;  Laterality: N/A;    reports that she quit smoking about 14 years ago. Her smoking use  included Cigarettes. She has a 20 pack-year smoking history. She has never used smokeless tobacco. She reports that  drinks alcohol. She reports that she does not use illicit drugs. family history includes Breast cancer in an unspecified family member; Cancer in her maternal aunt and mother; Cancer (age of onset: 8) in her sister; Cancer (age of onset: 22) in her cousin; Diabetes in her brother; Heart disease in her mother; and Uterine cancer in her mother. Allergies  Allergen Reactions  . Codeine     REACTION: unknown - pt. states unaware of having reaction to codeine  . Lisinopril     REACTION: cough   Current Outpatient Prescriptions on File Prior to Visit  Medication Sig Dispense Refill  . albuterol (PROAIR HFA) 108 (90 BASE) MCG/ACT inhaler Inhale 2 puffs into the lungs every 6 (six) hours as needed.  1 Inhaler  11  . allopurinol (ZYLOPRIM) 300 MG tablet Take 300 mg by mouth daily.      [name] amitriptyline (ELAVIL) 100 MG tablet Take 100 mg by mouth at bedtime.      [name] aspirin 81 MG tablet Take 81 mg by mouth daily.        . cephALEXin (KEFLEX) 500 MG capsule Take 1 capsule (500 mg total) by mouth 4 (four) times daily.  40 capsule  0  . Cholecalciferol (VITAMIN D3) 2000 UNITS TABS Take 1 tablet by mouth daily.        . DULoxetine (CYMBALTA) 30 MG capsule Take 60 mg by mouth daily.       . fish oil-omega-3 fatty acids 1000 MG capsule Take 2 g by mouth daily.       . furosemide (LASIX) 40 MG tablet Take 1 tablet (40 mg total) by mouth daily.  1 tablet    . levothyroxine (SYNTHROID, LEVOTHROID) 200 MCG tablet Take 200 mcg by mouth daily.       [name] levothyroxine (SYNTHROID, LEVOTHROID) 200 MCG tablet TAKE 2 TABLETS DAILY  180 tablet  3  . potassium chloride SA (KLOR-CON M20) 20 MEQ tablet Take 1 tablet (20 mEq total) by mouth daily.  30 tablet  11  . vitamin C (ASCORBIC ACID) 500 MG tablet Take 500 mg by mouth daily.        [name] warfarin (COUMADIN) 5 MG tablet 1 by mouth per day as instructed  100  tablet  3   No current facility-administered medications on file prior to visit.   Review of Systems  Constitutional: Negative for unexpected weight change, or unusual diaphoresis  HENT: Negative for tinnitus.   Eyes: Negative for photophobia and visual disturbance.  Respiratory: Negative for choking and stridor.   Gastrointestinal: Negative for vomiting and blood in stool.  Genitourinary: Negative for hematuria and decreased urine volume.  Musculoskeletal: Negative for acute joint swelling Skin: Negative for color change and wound.  Neurological: Negative for tremors and numbness other than noted  Psychiatric/Behavioral: Negative for decreased concentration or  hyperactivity.       Objective:   Physical Exam BP 102/72  Pulse 126  Temp(Src) 97.4 F (36.3 C) (Oral)  Ht 5' 5.5" (1.664 m)  Wt 339 lb 8 oz (153.996 kg)  BMI 55.62 kg/m2  SpO2 95% VS noted,  Constitutional: Pt appears well-developed and well-nourished. Annabell Sabal HENT: Head: NCAT.  Right Ear: External ear normal.  Left Ear: External ear normal.  Eyes: Conjunctivae and EOM are normal. Pupils are equal, round, and reactive to light.  Neck: Normal range of motion. Neck supple.  Cardiovascular: Normal rate and irregular rhythm.   Pulmonary/Chest: Effort normal and breath sounds decreased, no rales or wheezing.  Abd:  Soft, NT, non-distended, + BS Neurological: Pt is alert. Not confused  Skin: Skin is warm. No erythema. No LE edema Psychiatric: Pt behavior is normal. Thought content normal.     Assessment & Plan:

## 2013-03-09 NOTE — Assessment & Plan Note (Signed)
Borderline low, to follow closely,  to f/u any worsening symptoms or concerns BP Readings from Last 3 Encounters:  03/09/13 102/72  03/01/13 116/90  12/28/12 141/54

## 2013-03-09 NOTE — Assessment & Plan Note (Signed)
Stable, for f/u coumadin clinic as planned

## 2013-03-09 NOTE — Assessment & Plan Note (Signed)
Volume ok and symptoms stable, but little to no improveement with dilt 240; has been intolerant beta blocker last yr, has seen dr Allred/card, for incr dilt 360 mg ER qd, cont lasix as is, watch for low BP symptoms, has f/u with PA with card June 25

## 2013-03-09 NOTE — Patient Instructions (Signed)
OK to stop the diltiazem 240 mg per day Please take all new medication as prescribed - the diltiazem 360 mg per day Please continue all other medications as before, and refills have been done if requested. Please keep your appointments with your specialists as you have planned - Richardson Dopp, [place] on June 25, and Dr Stanford Breed Aug 1 Please have the pharmacy call with any other refills you may need.  Please remember to sign up for My Chart if you have not done so, as this will be important to you in the future with finding out test results, communicating by private email, and scheduling acute appointments online when needed.

## 2013-03-09 NOTE — Assessment & Plan Note (Signed)
stable overall by history and exam, recent data reviewed with pt, and pt to continue medical treatment as before,  to f/u any worsening symptoms or concerns SpO2 Readings from Last 3 Encounters:  03/09/13 95%  03/01/13 93%  12/28/12 96%

## 2013-03-13 ENCOUNTER — Ambulatory Visit (INDEPENDENT_AMBULATORY_CARE_PROVIDER_SITE_OTHER): Payer: Medicare Other | Admitting: General Practice

## 2013-03-13 DIAGNOSIS — I4892 Unspecified atrial flutter: Secondary | ICD-10-CM

## 2013-03-13 DIAGNOSIS — R000 Tachycardia, unspecified: Secondary | ICD-10-CM

## 2013-03-13 DIAGNOSIS — Z7901 Long term (current) use of anticoagulants: Secondary | ICD-10-CM

## 2013-03-14 ENCOUNTER — Ambulatory Visit (INDEPENDENT_AMBULATORY_CARE_PROVIDER_SITE_OTHER): Payer: Medicare Other | Admitting: Physician Assistant

## 2013-03-14 ENCOUNTER — Observation Stay (HOSPITAL_COMMUNITY): Payer: Medicare Other

## 2013-03-14 ENCOUNTER — Encounter (HOSPITAL_COMMUNITY): Payer: Self-pay | Admitting: General Practice

## 2013-03-14 ENCOUNTER — Inpatient Hospital Stay (HOSPITAL_COMMUNITY)
Admission: AD | Admit: 2013-03-14 | Discharge: 2013-03-15 | DRG: 308 | Disposition: A | Discharge status: Home / Self Care | Payer: Medicare Other | Source: Ambulatory Visit | Attending: Cardiology | Admitting: Cardiology

## 2013-03-14 ENCOUNTER — Encounter: Payer: Self-pay | Admitting: Cardiology

## 2013-03-14 ENCOUNTER — Encounter: Payer: Self-pay | Admitting: Physician Assistant

## 2013-03-14 VITALS — BP 120/74 | HR 123 | Ht 65.5 in | Wt 343.0 lb

## 2013-03-14 DIAGNOSIS — I079 Rheumatic tricuspid valve disease, unspecified: Secondary | ICD-10-CM | POA: Diagnosis present

## 2013-03-14 DIAGNOSIS — E785 Hyperlipidemia, unspecified: Secondary | ICD-10-CM | POA: Diagnosis present

## 2013-03-14 DIAGNOSIS — G4733 Obstructive sleep apnea (adult) (pediatric): Secondary | ICD-10-CM | POA: Diagnosis present

## 2013-03-14 DIAGNOSIS — M109 Gout, unspecified: Secondary | ICD-10-CM | POA: Diagnosis present

## 2013-03-14 DIAGNOSIS — G4730 Sleep apnea, unspecified: Secondary | ICD-10-CM

## 2013-03-14 DIAGNOSIS — D649 Anemia, unspecified: Secondary | ICD-10-CM | POA: Diagnosis present

## 2013-03-14 DIAGNOSIS — J449 Chronic obstructive pulmonary disease, unspecified: Secondary | ICD-10-CM | POA: Diagnosis present

## 2013-03-14 DIAGNOSIS — I509 Heart failure, unspecified: Secondary | ICD-10-CM | POA: Diagnosis present

## 2013-03-14 DIAGNOSIS — I252 Old myocardial infarction: Secondary | ICD-10-CM

## 2013-03-14 DIAGNOSIS — Z7901 Long term (current) use of anticoagulants: Secondary | ICD-10-CM

## 2013-03-14 DIAGNOSIS — J4489 Other specified chronic obstructive pulmonary disease: Secondary | ICD-10-CM | POA: Diagnosis present

## 2013-03-14 DIAGNOSIS — I495 Sick sinus syndrome: Secondary | ICD-10-CM | POA: Diagnosis present

## 2013-03-14 DIAGNOSIS — I4892 Unspecified atrial flutter: Principal | ICD-10-CM | POA: Diagnosis present

## 2013-03-14 DIAGNOSIS — E039 Hypothyroidism, unspecified: Secondary | ICD-10-CM

## 2013-03-14 DIAGNOSIS — G589 Mononeuropathy, unspecified: Secondary | ICD-10-CM | POA: Diagnosis present

## 2013-03-14 DIAGNOSIS — Z79899 Other long term (current) drug therapy: Secondary | ICD-10-CM

## 2013-03-14 DIAGNOSIS — Z853 Personal history of malignant neoplasm of breast: Secondary | ICD-10-CM

## 2013-03-14 DIAGNOSIS — E876 Hypokalemia: Secondary | ICD-10-CM

## 2013-03-14 DIAGNOSIS — I5030 Unspecified diastolic (congestive) heart failure: Secondary | ICD-10-CM

## 2013-03-14 DIAGNOSIS — F329 Major depressive disorder, single episode, unspecified: Secondary | ICD-10-CM | POA: Diagnosis present

## 2013-03-14 DIAGNOSIS — I10 Essential (primary) hypertension: Secondary | ICD-10-CM | POA: Diagnosis present

## 2013-03-14 DIAGNOSIS — Z6841 Body Mass Index (BMI) 40.0 and over, adult: Secondary | ICD-10-CM | POA: Diagnosis not present

## 2013-03-14 DIAGNOSIS — I2510 Atherosclerotic heart disease of native coronary artery without angina pectoris: Secondary | ICD-10-CM | POA: Diagnosis present

## 2013-03-14 DIAGNOSIS — Q2112 Patent foramen ovale: Secondary | ICD-10-CM

## 2013-03-14 DIAGNOSIS — E662 Morbid (severe) obesity with alveolar hypoventilation: Secondary | ICD-10-CM | POA: Diagnosis not present

## 2013-03-14 DIAGNOSIS — Z87891 Personal history of nicotine dependence: Secondary | ICD-10-CM

## 2013-03-14 DIAGNOSIS — Q211 Atrial septal defect: Secondary | ICD-10-CM | POA: Diagnosis not present

## 2013-03-14 DIAGNOSIS — I5031 Acute diastolic (congestive) heart failure: Secondary | ICD-10-CM | POA: Diagnosis not present

## 2013-03-14 DIAGNOSIS — R0609 Other forms of dyspnea: Secondary | ICD-10-CM | POA: Diagnosis not present

## 2013-03-14 DIAGNOSIS — Q2111 Secundum atrial septal defect: Secondary | ICD-10-CM

## 2013-03-14 DIAGNOSIS — I5033 Acute on chronic diastolic (congestive) heart failure: Secondary | ICD-10-CM

## 2013-03-14 DIAGNOSIS — F3289 Other specified depressive episodes: Secondary | ICD-10-CM | POA: Diagnosis present

## 2013-03-14 HISTORY — DX: Atrial septal defect: Q21.1

## 2013-03-14 HISTORY — DX: Patent foramen ovale: Q21.12

## 2013-03-14 LAB — TROPONIN I: Troponin I: <0.3 ng/mL (ref <0.30)

## 2013-03-14 LAB — COMPREHENSIVE METABOLIC PANEL
ALT: 56 U/L — ABNORMAL HIGH (ref 0–35)
AST: 52 U/L — ABNORMAL HIGH (ref 0–37)
CO2: 33 mEq/L — ABNORMAL HIGH (ref 19–32)
Calcium: 9.2 mg/dL (ref 8.4–10.5)
GFR calc non Af Amer: 69 mL/min — ABNORMAL LOW (ref >90)
Sodium: 142 mEq/L (ref 135–145)

## 2013-03-14 LAB — CBC WITH DIFFERENTIAL/PLATELET
Basophils Absolute: 0 10*3/uL (ref 0.0–0.1)
Eosinophils Relative: 6 % — ABNORMAL HIGH (ref 0–5)
Lymphocytes Relative: 32 % (ref 12–46)
Neutro Abs: 3.5 10*3/uL (ref 1.7–7.7)
Neutrophils Relative %: 57 % (ref 43–77)
Platelets: 163 10*3/uL (ref 150–400)
RDW: 14.4 % (ref 11.5–15.5)
WBC: 6.1 10*3/uL (ref 4.0–10.5)

## 2013-03-14 LAB — PROTIME-INR
INR: 2.78 — ABNORMAL HIGH (ref 0.00–1.49)
Prothrombin Time: 28.4 seconds — ABNORMAL HIGH (ref 11.6–15.2)

## 2013-03-14 LAB — APTT: aPTT: 42 seconds — ABNORMAL HIGH (ref 24–37)

## 2013-03-14 MED ORDER — DULOXETINE HCL 60 MG PO CPEP
60.0000 mg | ORAL_CAPSULE | Freq: Every day | ORAL | Status: DC
  Administered 2013-03-14: 60 mg via ORAL
  Filled 2013-03-14 (×2): qty 1

## 2013-03-14 MED ORDER — LEVOTHYROXINE SODIUM 200 MCG PO TABS
200.0000 ug | ORAL_TABLET | Freq: Every day | ORAL | Status: DC
  Administered 2013-03-15: 200 ug via ORAL
  Filled 2013-03-14 (×3): qty 1

## 2013-03-14 MED ORDER — WARFARIN - PHARMACIST DOSING INPATIENT: Freq: Every day | Status: DC

## 2013-03-14 MED ORDER — METOPROLOL TARTRATE 1 MG/ML IV SOLN: 2.5000 mg | Freq: Three times a day (TID) | INTRAVENOUS | Status: DC | PRN

## 2013-03-14 MED ORDER — ASPIRIN 81 MG PO TABS: 81.0000 mg | ORAL_TABLET | Freq: Every day | ORAL | Status: DC

## 2013-03-14 MED ORDER — ALBUTEROL SULFATE HFA 108 (90 BASE) MCG/ACT IN AERS: 2.0000 | INHALATION_SPRAY | Freq: Four times a day (QID) | RESPIRATORY_TRACT | Status: DC | PRN

## 2013-03-14 MED ORDER — FUROSEMIDE 10 MG/ML IJ SOLN
INTRAMUSCULAR | Status: AC
  Filled 2013-03-14: qty 4

## 2013-03-14 MED ORDER — WARFARIN SODIUM 5 MG PO TABS
5.0000 mg | ORAL_TABLET | Freq: Once | ORAL | Status: DC
  Filled 2013-03-14: qty 1

## 2013-03-14 MED ORDER — SODIUM CHLORIDE 0.9 % IV SOLN: INTRAVENOUS | Status: DC

## 2013-03-14 MED ORDER — SODIUM CHLORIDE 0.9 % IV SOLN: 250.0000 mL | INTRAVENOUS | Status: DC

## 2013-03-14 MED ORDER — ALLOPURINOL 300 MG PO TABS
300.0000 mg | ORAL_TABLET | Freq: Every day | ORAL | Status: DC
  Filled 2013-03-14 (×2): qty 1

## 2013-03-14 MED ORDER — DILTIAZEM HCL ER COATED BEADS 360 MG PO CP24
360.0000 mg | ORAL_CAPSULE | Freq: Every day | ORAL | Status: DC
  Filled 2013-03-14 (×2): qty 1

## 2013-03-14 MED ORDER — NITROGLYCERIN 0.4 MG SL SUBL: 0.4000 mg | SUBLINGUAL_TABLET | SUBLINGUAL | Status: DC | PRN

## 2013-03-14 MED ORDER — SODIUM CHLORIDE 0.9 % IJ SOLN
3.0000 mL | Freq: Two times a day (BID) | INTRAMUSCULAR | Status: DC
  Administered 2013-03-14: 3 mL via INTRAVENOUS

## 2013-03-14 MED ORDER — ONDANSETRON HCL 4 MG/2ML IJ SOLN: 4.0000 mg | Freq: Four times a day (QID) | INTRAMUSCULAR | Status: DC | PRN

## 2013-03-14 MED ORDER — AMITRIPTYLINE HCL 100 MG PO TABS
100.0000 mg | ORAL_TABLET | Freq: Every day | ORAL | Status: DC
  Administered 2013-03-14: 100 mg via ORAL
  Filled 2013-03-14 (×2): qty 1

## 2013-03-14 MED ORDER — FUROSEMIDE 40 MG PO TABS
40.0000 mg | ORAL_TABLET | Freq: Two times a day (BID) | ORAL | Status: DC
  Administered 2013-03-14: 40 mg via ORAL
  Filled 2013-03-14 (×4): qty 1

## 2013-03-14 MED ORDER — SODIUM CHLORIDE 0.9 % IJ SOLN: 3.0000 mL | INTRAMUSCULAR | Status: DC | PRN

## 2013-03-14 MED ORDER — FUROSEMIDE 10 MG/ML IJ SOLN
40.0000 mg | Freq: Once | INTRAMUSCULAR | Status: AC
  Administered 2013-03-14: 40 mg via INTRAVENOUS

## 2013-03-14 MED ORDER — ACETAMINOPHEN 325 MG PO TABS: 650.0000 mg | ORAL_TABLET | ORAL | Status: DC | PRN

## 2013-03-14 MED ORDER — ASPIRIN EC 81 MG PO TBEC
81.0000 mg | DELAYED_RELEASE_TABLET | Freq: Every day | ORAL | Status: DC
  Filled 2013-03-14 (×2): qty 1

## 2013-03-14 MED ORDER — SODIUM CHLORIDE 0.9 % IV SOLN: 250.0000 mL | INTRAVENOUS | Status: DC | PRN

## 2013-03-14 MED ORDER — POTASSIUM CHLORIDE CRYS ER 20 MEQ PO TBCR
20.0000 meq | EXTENDED_RELEASE_TABLET | Freq: Two times a day (BID) | ORAL | Status: DC
  Administered 2013-03-14: 20 meq via ORAL
  Filled 2013-03-14 (×3): qty 1

## 2013-03-14 NOTE — Progress Notes (Signed)
Pt articulated to the nurse that she uses a CPAP machine @ home, and wanted to know if she could obtain one her at [facility]. The nurse called the [facility] paging service to find out who was on call for [facility]. The paging service informed the nurse that it was Dr. Ernst Spell. Nurse waiting on Dr. Ernst Spell to return page to get an order for CPAP. Fara Boros

## 2013-03-14 NOTE — Consult Note (Signed)
ELECTROPHYSIOLOGY CONSULT NOTE    Patient ID: [name] [name] MRN: [id number], DOB/AGE: 1945/11/03 67 y.o.  Admit date: 03/14/2013 Date of Consult: 03-15-2013  Primary Physician: [name], MD Primary Cardiologist: Kirk Ruths (formerly Addison Lank)  Reason for Consultation: atrial flutter  HPI:  [name] [name] is a 67 year old female with a hsitory of CAD, HTN, HL, OSA, hypothyroidism, COPD on O2, L breast CA. LHC at [facility] 10/2005: pRCA 75%, LM < 25%, pLAD 25%, D1 25%, D2 50%. She suffered a NSTEMI in 01/2010 in the setting of SIRS from leg cellulitis. This was felt to be demand ischemia. Med Rx was pursued (felt to be a poor candidate for noninvasive imaging).   She has noted increased heart rates over the last 2 weeks. This has been noted on her pulse oximeter. She feels shaky and clammy at times. She may note increased dyspnea with exertion. She does have chronic dyspnea related to her COPD. She denies orthopnea or PND. She has noted increased LE edema. She has an unusual chest sensation from time to time. She describes it as throbbing. She denies exertional heaviness or tightness. She denies any associated radiation or nausea. She denies syncope. She has recently been treated for a UTI.  She is confident that she has not had tachycardia in the past because of the frequency of checks with her home pulse ox.  She was seen by her PCP about 2 weeks ago and was found to be in atypical flutter with RVR.  She was started on Diltiazem and Warfarin without improvement in ventricular rates.  She was referred to cardiology for further evaluation.  She was seen in the office by Richardson Dopp, PA yesterday who admitted her with plans for TEE/DCCV today.   She has had episodes of near syncope last year associated with sinus bradycardia.  At that time, her beta blockers were discontinued.  She wore an outpatient event monitor with no further bradyarrhythmias noted.   Last echo in 02-2012 demonstrated an EF of  55-60% with no RWMA.  LA 15m.   EP has been asked to evaluate for treatment options.  ROS of systems is negative except as outlined above.    Past Medical History  Diagnosis Date  . Hypertension   . Hyperlipidemia   . Anemia   . CAD (coronary artery disease)     a. Nonobstructive CAD 2007 (EF 75%.  RCA  proximal 75% tubular, 25% prox LAD, 25% d1, 50% D2) [facility] b. NSTEMI 01/2010 in setting of respiratory failure, medical approach (not a good candidate for noninvasive eval)  . OSA on CPAP   . Hypothyroidism   . Peripheral edema 06/13/2011  . Asthma 06/13/2011  . Depression 06/13/2011  . Neuropathy 06/13/2011  . Cervical radiculopathy 06/13/2011  . Gout 06/13/2011  . Obesity hypoventilation syndrome 06/13/2011  . COPD (chronic obstructive pulmonary disease)     on O2  . Breast cancer dx'd 2005    left  . Cellulitis     hx of cellulitis in left leg  . Impaired glucose tolerance 11/01/2012  . Dysrhythmia     atrial fibrilation     Surgical History:  Past Surgical History  Procedure Laterality Date  . Ovarian cyst removal    . Breast lumpectomy    . Cervical disc surgery      have cadaver bones,, screws, and plates   . [facility]  .  Dilation and curettage of uterus    . Colonoscopy    . Laparoscopic appendectomy  03/29/2012    Procedure: APPENDECTOMY LAPAROSCOPIC;  Surgeon: Adin Hector, MD;  Location: [facility];  Service: General;  Laterality: N/A;     Prescriptions prior to admission  Medication Sig Dispense Refill  . albuterol (PROVENTIL HFA;VENTOLIN HFA) 108 (90 BASE) MCG/ACT inhaler Inhale 2 puffs into the lungs every 6 (six) hours as needed for wheezing.      [name] allopurinol (ZYLOPRIM) 300 MG tablet Take 300 mg by mouth daily.      [name] amitriptyline (ELAVIL) 100 MG tablet Take 100 mg by mouth at bedtime.      [name] aspirin EC 81 MG tablet Take 81 mg by mouth every morning.      . Cholecalciferol (VITAMIN D) 2000 UNITS tablet Take  2,000 Units by mouth at bedtime.      [name] diltiazem (DILTIAZEM HCL CD) 360 MG 24 hr capsule Take 1 capsule (360 mg total) by mouth daily.  90 capsule  3  . DULoxetine (CYMBALTA) 30 MG capsule Take 60 mg by mouth at bedtime.      . furosemide (LASIX) 40 MG tablet Take 1 tablet (40 mg total) by mouth daily.  1 tablet    . levothyroxine (SYNTHROID, LEVOTHROID) 200 MCG tablet Take 200 mcg by mouth daily.       . Omega-3 Fatty Acids (FISH OIL PO) Take 1 capsule by mouth at bedtime.      . potassium chloride SA (KLOR-CON M20) 20 MEQ tablet Take 1 tablet (20 mEq total) by mouth daily.  30 tablet  11  . vitamin C (ASCORBIC ACID) 500 MG tablet Take 500 mg by mouth at bedtime.      [name] warfarin (COUMADIN) 5 MG tablet Take 5 mg by mouth every morning. 5 mg daily except 7.5 mg on Tues, Fri      . cephALEXin (KEFLEX) 500 MG capsule Take 500 mg by mouth 4 (four) times daily.        Inpatient Medications:  . allopurinol  300 mg Oral Daily  . amitriptyline  100 mg Oral QHS  . aspirin EC  81 mg Oral Daily  . diltiazem  360 mg Oral Daily  . DULoxetine  60 mg Oral Daily  . furosemide  40 mg Oral BID  . levothyroxine  200 mcg Oral QAC breakfast  . potassium chloride  20 mEq Oral BID  . sodium chloride  3 mL Intravenous Q12H  . sodium chloride  3 mL Intravenous Q12H  . Warfarin - Pharmacist Dosing Inpatient   Does not apply q1800    Allergies:  Allergies  Allergen Reactions  . Codeine     REACTION: unknown - pt. states unaware of having reaction to codeine  . Lisinopril     REACTION: cough    History   Social History  . Marital Status: Divorced    Spouse Name: N/A    Number of Children: N/A  . Years of Education: N/A   Occupational History  . Not on file.   Social History Main Topics  . Smoking status: Former Smoker -- 1.00 packs/day for 20 years    Types: Cigarettes    Quit date: 09/20/1998  . Smokeless tobacco: Never Used     Comment: 1ppd x 20 years  . Alcohol Use: Yes     Comment:  rare  . Drug Use: No  . Sexually Active: Not Currently    Birth Control/  Protection: Post-menopausal   Other Topics Concern  . Not on file   Social History Narrative  . No narrative on file     Family History  Problem Relation Age of Onset  . Breast cancer    . Uterine cancer Mother   . Cancer Mother     Theadora Rama  . Heart disease Mother   . Cancer Sister 9    breast  . Diabetes Brother   . Cancer Maternal Aunt     breast  . Cancer Cousin 40    breast      Labs:   Lab Results  Component Value Date   WBC 6.1 03/14/2013   HGB 13.3 03/14/2013   HCT 41.0 03/14/2013   MCV 89.3 03/14/2013   PLT 163 03/14/2013    Recent Labs Lab 03/14/13 1240  NA 142  K 3.5  CL 97  CO2 33*  BUN 13  CREATININE 0.86  CALCIUM 9.2  PROT 7.2  BILITOT 0.5  ALKPHOS 106  ALT 56*  AST 52*  GLUCOSE 120*   Lab Results  Component Value Date   CKTOTAL 202* 03/28/2012   CKMB 2.7 03/28/2012   TROPONINI <0.30 03/14/2013   Lab Results  Component Value Date   CHOL 160 10/05/2012   CHOL 174 12/28/2011   CHOL  Value: 115        ATP III CLASSIFICATION:  <200     mg/dL   Desirable  200-239  mg/dL   Borderline High  >=240    mg/dL   High        02/13/2010   Lab Results  Component Value Date   HDL 48.30 10/05/2012   HDL 46.30 12/28/2011   HDL 45 02/13/2010   Lab Results  Component Value Date   LDLCALC 89 10/05/2012   LDLCALC 96 12/28/2011   LDLCALC  Value: 51        Total Cholesterol/HDL:CHD Risk Coronary Heart Disease Risk Table                     Men   Women  1/2 Average Risk   3.4   3.3  Average Risk       5.0   4.4  2 X Average Risk   9.6   7.1  3 X Average Risk  23.4   11.0        Use the calculated Patient Ratio above and the CHD Risk Table to determine the patient's CHD Risk.        ATP III CLASSIFICATION (LDL):  <100     mg/dL   Optimal  100-129  mg/dL   Near or Above                    Optimal  130-159  mg/dL   Borderline  160-189  mg/dL   High  >190     mg/dL   Very High 02/13/2010   Lab Results    Component Value Date   TRIG 116.0 10/05/2012   TRIG 159.0* 12/28/2011   TRIG 93 02/13/2010   Lab Results  Component Value Date   CHOLHDL 3 10/05/2012   CHOLHDL 4 12/28/2011   CHOLHDL 2.6 02/13/2010   No results found for this basename: LDLDIRECT    Lab Results  Component Value Date   DDIMER 0.58* 03/27/2012     Radiology/Studies: X-ray Chest Pa And Lateral 03/14/2013   *RADIOLOGY REPORT*  Clinical Data: Dyspnea  CHEST - 2 VIEW  Comparison:  03/01/2013  Findings: The cardiac silhouette is mildly enlarged.  Mediastinum is normal in contour caliber.  No hilar masses.  The lungs hyperexpanded with flattened hemidiaphragms.  The lungs are clear.  No pleural effusion or pneumothorax.  The bony thorax is intact.  IMPRESSION: No acute cardiopulmonary disease.  Mild cardiomegaly and lung hyperexpansion.  No change from prior study.   Original Report Authenticated By: Lajean Manes, M.D.   EKG: atypical atrial flutter, rate 130  TELEMETRY: atrial flutter with RVR Physical Exam: Filed Vitals:   03/14/13 1932 03/14/13 2140 03/14/13 2145 03/15/13 0400  BP: 144/91   126/76  Pulse: 125 120 120 122  Temp: 97.4 F (36.3 C)   97.8 F (36.6 C)  TempSrc: Oral   Oral  Resp: 18 18 18 18   Height:      Weight:      SpO2: 97% 98% 98% 98%    GEN- The patient is morbidly appearing, alert and oriented x 3 today.   Head- normocephalic, atraumatic Eyes-  Sclera clear, conjunctiva pink Ears- hearing intact Oropharynx- clear Neck- supple,  Lungs- Clear to ausculation bilaterally, normal work of breathing Heart- tachycardic irregular rhythm, decreased heart sounds GI- soft, NT, ND, + BS Extremities- no clubbing, cyanosis, + edema MS- no significant deformity or atrophy Skin- no rash or lesion Psych- euthymic mood, full affect Neuro- strength and sensation are intact  Assessment and Plan:  1. Atypical atrial flutter Clinically stable, anticoagulated Rates are stable  Given lung disease, morbid obesity,  and morbid obesity, she would be a very poor candidate for ablation.  I would therefore advise TEE guided cardioversion at this time.  Risks, benefits, and alternatives to the procedure were discussed at length with the patient who wishes to proceed.  2. Sick sinus syndrome Stop diltiazem  3. Chronic lung disease Continue O2  4. Morbid obesity- would benefit from weight loss.  5. HTN Stable No change required today  He should be able to go home later today.

## 2013-03-14 NOTE — Progress Notes (Signed)
[facility], [place],   [address] Phone: [phone] Fax:  [phone]  Date:  03/14/2013   ID:  [name] [name], DOB November 17, 1945, MRN [id number]  PCP:  [name], MD  Cardiologist:  Dr.  Bing Quarry => Dr. Kirk Ruths   Electrophysiologist:  Dr. Thompson Grayer   History of Present Illness: [name] [name] is a 67 y.o. female who returns for evaluation of AFlutter with RVR.  She has a hx of CAD, HTN, HL, OSA, hypothyroidism, COPD on O2, L breast CA.  LHC at [facility] 10/2005:  pRCA 75%, LM < 25%, pLAD 25%, D1 25%, D2 50%.  She suffered a NSTEMI in 01/2010 in the setting of SIRS from leg cellulitis.  This was felt to be demand ischemia.  Med Rx was pursued (felt to be a poor candidate for noninvasive imaging).  She was evaluated for near syncope and SSS with pauses by EP in 05/2012.  Beta blockers and Provera were stopped.  Event monitor demonstrated no significant arrhythmias.  Echo 02/2012:  EF 55-60%, mild AI, MAC, mod LAE, PASP mildly increased.  Last seen by Dr.  Bing Quarry in 10/2012.  Recently seen by her PCP and noted to be in AFlutter with RVR.  Coumadin has been started.  She has noted increased heart rates over the last 2 weeks. This has been noted on her pulse oximeter. She feels shaky and clammy at times. She may note increased dyspnea with exertion. She does have chronic dyspnea related to her COPD. She denies orthopnea or PND. She has noted increased LE edema. She has an unusual chest sensation from time to time. She describes it as throbbing. She denies exertional heaviness or tightness. She denies any associated radiation or nausea. She denies syncope. She has recently been treated for a UTI.  Labs (1/14):  LDL 89 Labs (6/14):  K 3.6, Cr 0.9, ALT 51, Hgb 13.6, TSH 1.19,   Wt Readings from Last 3 Encounters:  03/14/13 343 lb (155.584 kg)  03/09/13 339 lb 8 oz (153.996 kg)  03/01/13 342 lb (155.13 kg)     Past Medical History  Diagnosis Date  .  Hypertension   . Hyperlipidemia   . Anemia   . CAD (coronary artery disease)     a. Nonobstructive CAD 2007 (EF 75%.  RCA  proximal 75% tubular, 25% prox LAD, 25% d1, 50% D2) at [name]. b. NSTEMI 01/2010 in setting of respiratory failure, medical approach (not a good candidate for noninvasive eval)  . OSA on CPAP   . Hypothyroidism   . Peripheral edema 06/13/2011  . Asthma 06/13/2011  . Depression 06/13/2011  . Neuropathy 06/13/2011  . Cervical radiculopathy 06/13/2011  . Gout 06/13/2011  . Obesity hypoventilation syndrome 06/13/2011  . COPD (chronic obstructive pulmonary disease)     on O2  . Breast cancer dx'd 2005    left  . Cellulitis     hx of cellulitis in left leg  . Impaired glucose tolerance 11/01/2012    Current Outpatient Prescriptions  Medication Sig Dispense Refill  . albuterol (PROAIR HFA) 108 (90 BASE) MCG/ACT inhaler Inhale 2 puffs into the lungs every 6 (six) hours as needed.  1 Inhaler  11  . allopurinol (ZYLOPRIM) 300 MG tablet Take 300 mg by mouth daily.      [name] amitriptyline (ELAVIL) 100 MG tablet Take 100 mg by mouth at bedtime.      [name] aspirin 81 MG tablet Take 81 mg by mouth daily.        [name]  diltiazem (DILTIAZEM HCL CD) 360 MG 24 hr capsule Take 1 capsule (360 mg total) by mouth daily.  90 capsule  3  . DULoxetine (CYMBALTA) 30 MG capsule Take 60 mg by mouth daily.       . fish oil-omega-3 fatty acids 1000 MG capsule Take 2 g by mouth daily.       . furosemide (LASIX) 40 MG tablet Take 1 tablet (40 mg total) by mouth daily.  1 tablet    . levothyroxine (SYNTHROID, LEVOTHROID) 200 MCG tablet TAKE 2 TABLETS DAILY  180 tablet  3  . potassium chloride SA (KLOR-CON M20) 20 MEQ tablet Take 1 tablet (20 mEq total) by mouth daily.  30 tablet  11  . vitamin C (ASCORBIC ACID) 500 MG tablet Take 500 mg by mouth daily.        [name] warfarin (COUMADIN) 5 MG tablet 1 by mouth per day as instructed  100 tablet  3  . Cholecalciferol (VITAMIN D3) 2000 UNITS TABS Take 1 tablet by mouth  daily.        [name] levothyroxine (SYNTHROID, LEVOTHROID) 200 MCG tablet Take 200 mcg by mouth daily.        No current facility-administered medications for this visit.    Allergies:    Allergies  Allergen Reactions  . Codeine     REACTION: unknown - pt. states unaware of having reaction to codeine  . Lisinopril     REACTION: cough    Social History:  The patient  reports that she quit smoking about 14 years ago. Her smoking use included Cigarettes. She has a 20 pack-year smoking history. She has never used smokeless tobacco. She reports that  drinks alcohol. She reports that she does not use illicit drugs.   Family history: The patient's family history includes Breast cancer in an unspecified family member; Cancer in her maternal aunt and mother; Cancer (age of onset: 62) in her sister; Cancer (age of onset: 60) in her cousin; Diabetes in her brother; Heart disease in her mother; and Uterine cancer in her mother.   ROS:  Please see the history of present illness.      All other systems reviewed and negative.   PHYSICAL EXAM: VS:  BP 120/74  Pulse 123  Ht 5' 5.5" (1.664 m)  Wt 343 lb (155.584 kg)  BMI 56.19 kg/m2 Well nourished, well developed, in no acute distress HEENT: normal Neck: JVD cannot be assessed Cardiac:  normal S1, S2; rapid regular rhythm; no murmur Lungs:  Decreased breath sounds bilaterally, no wheezing, rhonchi or rales Abd: soft, nontender  Ext: type 1+ bilateral LE edema Skin: warm and dry Neuro:  CNs 2-12 intact, no focal abnormalities noted  EKG:  Atrial flutter, HR 123     ASSESSMENT AND PLAN:  1. Atrial Flutter with RVR:  The patient is on high dose diltiazem. She has a history of sick sinus syndrome. This resolved off of AV nodal blocking agents. Her heart rate remains elevated. She likely has some mild volume overload on exam as well. I reviewed her case today with Dr. Ron Parker. We have decided that admission to the hospital is the best course of action. The  patient is agreeable to this. Her INR yesterday was therapeutic for the first time. Continue Coumadin. Pharmacy will manage Coumadin. Start IV heparin if her INR drops below 2. She has taken her diltiazem already today. I will order when necessary IV Lopressor for heart rates greater than 130. I will schedule her  for a TEE guided cardioversion tomorrow to restore NSR. We will arrange for EP to see her either tonight or tomorrow morning. If cardioversion is not felt to be the best course of action, this can be canceled by the EP service.   2. CAD: She has had some atypical chest pain. I suspect that this is likely related to her rapid rate. Check cardiac markers. 3. Diastolic CHF: I suspect that she has mild volume overload in the setting of atrial flutter with RVR and preserved LV function. I will give her Lasix 40 mg IV x1. I'll increase her daily Lasix to 40 mg by mouth twice a day. 4. COPD: Continue current regimen and oxygen. 5. Hypothyroidism: Continue current regimen. Recent TSH normal. 6. Hypertension: Controlled. 7. OSA: Continue BiPAP. 8. Disposition: Admit to [facility] today.  Signed, Richardson Dopp, PA-C  03/14/2013 9:12 AM

## 2013-03-14 NOTE — H&P (Signed)
History and Physical  Date:  03/14/2013   ID:  Desirai Traxler, DOB 16-May-1946, MRN [id number]  PCP:  [name], MD  Cardiologist:  Dr.  Bing Quarry => Dr. Kirk Ruths   Electrophysiologist:  Dr. Thompson Grayer   History of Present Illness: [name] [name] is a 67 y.o. female who returns for evaluation of AFlutter with RVR.  She has a hx of CAD, HTN, HL, OSA, hypothyroidism, COPD on O2, L breast CA.  LHC at [facility] 10/2005:  pRCA 75%, LM < 25%, pLAD 25%, D1 25%, D2 50%.  She suffered a NSTEMI in 01/2010 in the setting of SIRS from leg cellulitis.  This was felt to be demand ischemia.  Med Rx was pursued (felt to be a poor candidate for noninvasive imaging).  She was evaluated for near syncope and SSS with pauses by EP in 05/2012.  Beta blockers and Provera were stopped.  Event monitor demonstrated no significant arrhythmias.  Echo 02/2012:  EF 55-60%, mild AI, MAC, mod LAE, PASP mildly increased.  Last seen by Dr.  Bing Quarry in 10/2012.  Recently seen by her PCP and noted to be in AFlutter with RVR.  Coumadin has been started.  She has noted increased heart rates over the last 2 weeks. This has been noted on her pulse oximeter. She feels shaky and clammy at times. She may note increased dyspnea with exertion. She does have chronic dyspnea related to her COPD. She denies orthopnea or PND. She has noted increased LE edema. She has an unusual chest sensation from time to time. She describes it as throbbing. She denies exertional heaviness or tightness. She denies any associated radiation or nausea. She denies syncope. She has recently been treated for a UTI.  Labs (1/14):  LDL 89 Labs (6/14):  K 3.6, Cr 0.9, ALT 51, Hgb 13.6, TSH 1.19,   Wt Readings from Last 3 Encounters:  03/14/13 343 lb (155.584 kg)  03/09/13 339 lb 8 oz (153.996 kg)  03/01/13 342 lb (155.13 kg)     Past Medical History  Diagnosis Date  . Hypertension   . Hyperlipidemia   . Anemia   . CAD (coronary artery disease)     a.  Nonobstructive CAD 2007 (EF 75%.  RCA  proximal 75% tubular, 25% prox LAD, 25% d1, 50% D2) at [facility]. b. NSTEMI 01/2010 in setting of respiratory failure, medical approach (not a good candidate for noninvasive eval)  . OSA on CPAP   . Hypothyroidism   . Peripheral edema 06/13/2011  . Asthma 06/13/2011  . Depression 06/13/2011  . Neuropathy 06/13/2011  . Cervical radiculopathy 06/13/2011  . Gout 06/13/2011  . Obesity hypoventilation syndrome 06/13/2011  . COPD (chronic obstructive pulmonary disease)     on O2  . Breast cancer dx'd 2005    left  . Cellulitis     hx of cellulitis in left leg  . Impaired glucose tolerance 11/01/2012    Current Outpatient Prescriptions  Medication Sig Dispense Refill  . albuterol (PROAIR HFA) 108 (90 BASE) MCG/ACT inhaler Inhale 2 puffs into the lungs every 6 (six) hours as needed.  1 Inhaler  11  . allopurinol (ZYLOPRIM) 300 MG tablet Take 300 mg by mouth daily.      [name] amitriptyline (ELAVIL) 100 MG tablet Take 100 mg by mouth at bedtime.      [name] aspirin 81 MG tablet Take 81 mg by mouth daily.        [name] diltiazem (DILTIAZEM HCL CD) 360 MG 24  hr capsule Take 1 capsule (360 mg total) by mouth daily.  90 capsule  3  . DULoxetine (CYMBALTA) 30 MG capsule Take 60 mg by mouth daily.       . fish oil-omega-3 fatty acids 1000 MG capsule Take 2 g by mouth daily.       . furosemide (LASIX) 40 MG tablet Take 1 tablet (40 mg total) by mouth daily.  1 tablet    . levothyroxine (SYNTHROID, LEVOTHROID) 200 MCG tablet TAKE 2 TABLETS DAILY  180 tablet  3  . potassium chloride SA (KLOR-CON M20) 20 MEQ tablet Take 1 tablet (20 mEq total) by mouth daily.  30 tablet  11  . vitamin C (ASCORBIC ACID) 500 MG tablet Take 500 mg by mouth daily.        [name] warfarin (COUMADIN) 5 MG tablet 1 by mouth per day as instructed  100 tablet  3  . Cholecalciferol (VITAMIN D3) 2000 UNITS TABS Take 1 tablet by mouth daily.        [name] levothyroxine (SYNTHROID, LEVOTHROID) 200 MCG tablet Take 200 mcg by mouth  daily.        No current facility-administered medications for this visit.    Allergies:    Allergies  Allergen Reactions  . Codeine     REACTION: unknown - pt. states unaware of having reaction to codeine  . Lisinopril     REACTION: cough    Social History:  The patient  reports that she quit smoking about 14 years ago. Her smoking use included Cigarettes. She has a 20 pack-year smoking history. She has never used smokeless tobacco. She reports that  drinks alcohol. She reports that she does not use illicit drugs.   Family history: The patient's family history includes Breast cancer in an unspecified family member; Cancer in her maternal aunt and mother; Cancer (age of onset: 21) in her sister; Cancer (age of onset: 35) in her cousin; Diabetes in her brother; Heart disease in her mother; and Uterine cancer in her mother.   ROS:  Please see the history of present illness.      All other systems reviewed and negative.   PHYSICAL EXAM: VS:  BP 120/74  Pulse 123  Ht 5' 5.5" (1.664 m)  Wt 343 lb (155.584 kg)  BMI 56.19 kg/m2 Well nourished, well developed, in no acute distress HEENT: normal Neck: JVD cannot be assessed Cardiac:  normal S1, S2; rapid regular rhythm; no murmur Lungs:  Decreased breath sounds bilaterally, no wheezing, rhonchi or rales Abd: soft, nontender  Ext: type 1+ bilateral LE edema Skin: warm and dry Neuro:  CNs 2-12 intact, no focal abnormalities noted  EKG:  Atrial flutter, HR 123     ASSESSMENT AND PLAN:  1. Atrial Flutter with RVR:  The patient is on high dose diltiazem. She has a history of sick sinus syndrome. This resolved off of AV nodal blocking agents. Her heart rate remains elevated. She likely has some mild volume overload on exam as well. I reviewed her case today with Dr. Ron Parker. We have decided that admission to the hospital is the best course of action. The patient is agreeable to this. Her INR yesterday was therapeutic for the first time.  Continue Coumadin. Pharmacy will manage Coumadin. Start IV heparin if her INR drops below 2. She has taken her diltiazem already today. I will order when necessary IV Lopressor for heart rates greater than 130. I will schedule her for a TEE guided cardioversion tomorrow to  restore NSR. We will arrange for EP to see her either tonight or tomorrow morning. If cardioversion is not felt to be the best course of action, this can be canceled by the EP service.   2. CAD: She has had some atypical chest pain. I suspect that this is likely related to her rapid rate. Check cardiac markers. 3. Diastolic CHF: I suspect that she has mild volume overload in the setting of atrial flutter with RVR and preserved LV function. I will give her Lasix 40 mg IV x1. I'll increase her daily Lasix to 40 mg by mouth twice a day. 4. COPD: Continue current regimen and oxygen. 5. Hypothyroidism: Continue current regimen. Recent TSH normal. 6. Hypertension: Controlled. 7. OSA: Continue BiPAP. 8. Disposition: Admit to [facility] today.  Signed, Richardson Dopp, PA-C  03/14/2013 9:12 AM   Patient seen and examined. I agree with the assessment and plan as detailed above. See also my additional thoughts below.   I made the decision with Mr. Kathlen Mody to admit the patient. There is symptomatic atrial flutter. There is suggestion with all of her problems that she has bradycardia tachycardia syndrome. We need to try to convert her back to sinus. I wonder if primary atrial flutter ablation may be the best approach. We are arranging for electrophysiology to see the patient. Patient is tentatively scheduled for TEE cardioversion later in the day tomorrow. Electrophysiology can coordinate with our TEE team to make a final plan.  Dola Argyle, MD, [facility] 03/14/2013 10:54 AM

## 2013-03-14 NOTE — Progress Notes (Addendum)
ANTICOAGULATION CONSULT NOTE - Initial Consult  Pharmacy Consult for Heparin for INR <2 and Coumadin Indication: atrial fibrillation  Allergies  Allergen Reactions  . Codeine     REACTION: unknown - pt. states unaware of having reaction to codeine  . Lisinopril     REACTION: cough    Patient Measurements: Height: 166 cm Weight: 155.6 kg Heparin Dosing Weight: 97.3 kg  Vital Signs: Temp: 97.8 F (36.6 C) (06/25 0900) Temp src: Oral (06/25 0900) BP: 99/77 mmHg (06/25 0900) Pulse Rate: 126 (06/25 0900)  Labs:  Recent Labs  03/13/13 0902 03/14/13 1240  HGB  --  13.3  HCT  --  41.0  PLT  --  163  APTT  --  42*  LABPROT  --  28.4*  INR 2.9 2.78*    Estimated Creatinine Clearance: 94.4 ml/min (by C-G formula based on Cr of 0.9).   Medical History: Past Medical History  Diagnosis Date  . Hypertension   . Hyperlipidemia   . Anemia   . CAD (coronary artery disease)     a. Nonobstructive CAD 2007 (EF 75%.  RCA  proximal 75% tubular, 25% prox LAD, 25% d1, 50% D2) at [facility]. b. NSTEMI 01/2010 in setting of respiratory failure, medical approach (not a good candidate for noninvasive eval)  . OSA on CPAP   . Hypothyroidism   . Peripheral edema 06/13/2011  . Asthma 06/13/2011  . Depression 06/13/2011  . Neuropathy 06/13/2011  . Cervical radiculopathy 06/13/2011  . Gout 06/13/2011  . Obesity hypoventilation syndrome 06/13/2011  . COPD (chronic obstructive pulmonary disease)     on O2  . Breast cancer dx'd 2005    left  . Cellulitis     hx of cellulitis in left leg  . Impaired glucose tolerance 11/01/2012  . Dysrhythmia     atrial fibrilation    Medications: (not verified with patient yet) Prescriptions prior to admission  Medication Sig Dispense Refill  . albuterol (PROAIR HFA) 108 (90 BASE) MCG/ACT inhaler Inhale 2 puffs into the lungs every 6 (six) hours as needed.  1 Inhaler  11  . allopurinol (ZYLOPRIM) 300 MG tablet Take 300 mg by mouth daily.      [name] amitriptyline  (ELAVIL) 100 MG tablet Take 100 mg by mouth at bedtime.      [name] aspirin 81 MG tablet Take 81 mg by mouth daily.        . Cholecalciferol (VITAMIN D3) 2000 UNITS TABS Take 1 tablet by mouth daily.        [name] diltiazem (DILTIAZEM HCL CD) 360 MG 24 hr capsule Take 1 capsule (360 mg total) by mouth daily.  90 capsule  3  . DULoxetine (CYMBALTA) 30 MG capsule Take 60 mg by mouth daily.       . fish oil-omega-3 fatty acids 1000 MG capsule Take 2 g by mouth daily.       . furosemide (LASIX) 40 MG tablet Take 1 tablet (40 mg total) by mouth daily.  1 tablet    . levothyroxine (SYNTHROID, LEVOTHROID) 200 MCG tablet Take 200 mcg by mouth daily.       [name] levothyroxine (SYNTHROID, LEVOTHROID) 200 MCG tablet TAKE 2 TABLETS DAILY  180 tablet  3  . potassium chloride SA (KLOR-CON M20) 20 MEQ tablet Take 1 tablet (20 mEq total) by mouth daily.  30 tablet  11  . vitamin C (ASCORBIC ACID) 500 MG tablet Take 500 mg by mouth daily.        [name]  warfarin (COUMADIN) 5 MG tablet 1 by mouth per day as instructed  100 tablet  3    Assessment: 67 y/o female admitted for evaluation of AFlutter. She was started on Coumadin 03/01/13. Last Coumadin Clinic note states patient is to take Coumadin 5 mg daily except 7.5 mg on Tues, Fri. INR was 2.9 yesterday and is 2.78 today (therapeutic). No bleeding noted, CBC is wnl.  Spoke with patient and confirmed home regimen. She took her Coumadin today so I will not give her any tonight. She is agreeable to change the time she takes Coumadin to evening/bedtime.  Goal of Therapy:  INR 2-3 Heparin level 0.3-0.7 units/ml Monitor platelets by anticoagulation protocol: Yes   Plan:  -No heparin drip as INR >2 -No Coumadin tonight  -INR daily -Monitor for signs/symptoms of bleeding -Educated today  [place], Pharm.D., BCPS Clinical Pharmacist Pager: [phone] 03/14/2013 1:36 PM

## 2013-03-14 NOTE — Progress Notes (Signed)
Unit CM UR Completed by [facility] ED CM  W. Jessah Danser RN  

## 2013-03-15 ENCOUNTER — Encounter (HOSPITAL_COMMUNITY): Payer: Self-pay | Admitting: Anesthesiology

## 2013-03-15 ENCOUNTER — Encounter (HOSPITAL_COMMUNITY): Payer: Self-pay | Admitting: *Deleted

## 2013-03-15 ENCOUNTER — Inpatient Hospital Stay (HOSPITAL_COMMUNITY): Payer: Medicare Other | Admitting: Anesthesiology

## 2013-03-15 ENCOUNTER — Encounter (HOSPITAL_COMMUNITY)
Admission: AD | Disposition: A | Discharge status: Home / Self Care | Payer: Self-pay | Source: Ambulatory Visit | Attending: Cardiology

## 2013-03-15 DIAGNOSIS — I10 Essential (primary) hypertension: Secondary | ICD-10-CM | POA: Diagnosis not present

## 2013-03-15 DIAGNOSIS — I5031 Acute diastolic (congestive) heart failure: Secondary | ICD-10-CM | POA: Diagnosis not present

## 2013-03-15 DIAGNOSIS — I4892 Unspecified atrial flutter: Secondary | ICD-10-CM

## 2013-03-15 DIAGNOSIS — J449 Chronic obstructive pulmonary disease, unspecified: Secondary | ICD-10-CM

## 2013-03-15 DIAGNOSIS — Z6841 Body Mass Index (BMI) 40.0 and over, adult: Secondary | ICD-10-CM | POA: Diagnosis not present

## 2013-03-15 DIAGNOSIS — I495 Sick sinus syndrome: Secondary | ICD-10-CM

## 2013-03-15 DIAGNOSIS — Q211 Atrial septal defect: Secondary | ICD-10-CM

## 2013-03-15 DIAGNOSIS — E662 Morbid (severe) obesity with alveolar hypoventilation: Secondary | ICD-10-CM | POA: Diagnosis not present

## 2013-03-15 DIAGNOSIS — E876 Hypokalemia: Secondary | ICD-10-CM

## 2013-03-15 DIAGNOSIS — I5033 Acute on chronic diastolic (congestive) heart failure: Secondary | ICD-10-CM

## 2013-03-15 DIAGNOSIS — I2510 Atherosclerotic heart disease of native coronary artery without angina pectoris: Secondary | ICD-10-CM | POA: Diagnosis not present

## 2013-03-15 HISTORY — PX: CARDIOVERSION: SHX1299

## 2013-03-15 HISTORY — PX: TEE WITHOUT CARDIOVERSION: SHX5443

## 2013-03-15 LAB — BASIC METABOLIC PANEL
CO2: 33 mEq/L — ABNORMAL HIGH (ref 19–32)
Calcium: 8.6 mg/dL (ref 8.4–10.5)
Creatinine, Ser: 0.77 mg/dL (ref 0.50–1.10)
GFR calc non Af Amer: 86 mL/min — ABNORMAL LOW (ref >90)
Glucose, Bld: 114 mg/dL — ABNORMAL HIGH (ref 70–99)

## 2013-03-15 SURGERY — ECHOCARDIOGRAM, TRANSESOPHAGEAL
Anesthesia: General

## 2013-03-15 MED ORDER — WARFARIN SODIUM 7.5 MG PO TABS
7.5000 mg | ORAL_TABLET | Freq: Once | ORAL | Status: DC
  Filled 2013-03-15: qty 1

## 2013-03-15 MED ORDER — VITAMIN D 50 MCG (2000 UT) PO TABS: 2000.0000 [IU] | ORAL_TABLET | Freq: Every day | ORAL | Status: DC

## 2013-03-15 MED ORDER — DIPHENHYDRAMINE HCL 50 MG/ML IJ SOLN
INTRAMUSCULAR | Status: AC
  Filled 2013-03-15: qty 1

## 2013-03-15 MED ORDER — ASPIRIN EC 81 MG PO TBEC: 81.0000 mg | DELAYED_RELEASE_TABLET | Freq: Every morning | ORAL | Status: DC

## 2013-03-15 MED ORDER — MIDAZOLAM HCL 10 MG/2ML IJ SOLN
INTRAMUSCULAR | Status: DC | PRN
  Administered 2013-03-15: 2 mg via INTRAVENOUS
  Administered 2013-03-15 (×2): 1 mg via INTRAVENOUS

## 2013-03-15 MED ORDER — ALBUTEROL SULFATE HFA 108 (90 BASE) MCG/ACT IN AERS: 2.0000 | INHALATION_SPRAY | Freq: Four times a day (QID) | RESPIRATORY_TRACT | Status: DC | PRN

## 2013-03-15 MED ORDER — FENTANYL CITRATE 0.05 MG/ML IJ SOLN
INTRAMUSCULAR | Status: AC
  Filled 2013-03-15: qty 2

## 2013-03-15 MED ORDER — PROPOFOL 10 MG/ML IV BOLUS
INTRAVENOUS | Status: DC | PRN
  Administered 2013-03-15: 80 mg via INTRAVENOUS

## 2013-03-15 MED ORDER — BUTAMBEN-TETRACAINE-BENZOCAINE 2-2-14 % EX AERO
INHALATION_SPRAY | CUTANEOUS | Status: DC | PRN
  Administered 2013-03-15: 2 via TOPICAL

## 2013-03-15 MED ORDER — FUROSEMIDE 40 MG PO TABS: 40.0000 mg | ORAL_TABLET | Freq: Two times a day (BID) | ORAL | Status: DC

## 2013-03-15 MED ORDER — POTASSIUM CHLORIDE CRYS ER 20 MEQ PO TBCR: 20.0000 meq | EXTENDED_RELEASE_TABLET | Freq: Two times a day (BID) | ORAL | Status: DC

## 2013-03-15 MED ORDER — FENTANYL CITRATE 0.05 MG/ML IJ SOLN
INTRAMUSCULAR | Status: DC | PRN
  Administered 2013-03-15 (×2): 25 ug via INTRAVENOUS

## 2013-03-15 MED ORDER — CEPHALEXIN 500 MG PO CAPS: 500.0000 mg | ORAL_CAPSULE | Freq: Four times a day (QID) | ORAL | Status: DC

## 2013-03-15 MED ORDER — WARFARIN SODIUM 5 MG PO TABS: 5.0000 mg | ORAL_TABLET | Freq: Every morning | ORAL | Status: DC

## 2013-03-15 MED ORDER — DULOXETINE HCL 60 MG PO CPEP: 60.0000 mg | ORAL_CAPSULE | Freq: Every day | ORAL | Status: DC

## 2013-03-15 MED ORDER — MIDAZOLAM HCL 5 MG/ML IJ SOLN
INTRAMUSCULAR | Status: AC
  Filled 2013-03-15: qty 2

## 2013-03-15 MED ORDER — VITAMIN C 500 MG PO TABS: 500.0000 mg | ORAL_TABLET | Freq: Every day | ORAL | Status: DC

## 2013-03-15 NOTE — Preoperative (Signed)
Beta Blockers   Reason not to administer Beta Blockers:Not Applicable 

## 2013-03-15 NOTE — Progress Notes (Signed)
*  PRELIMINARY RESULTS* Echocardiogram TEE has been performed.  Leavy Cella 03/15/2013, 4:04 PM

## 2013-03-15 NOTE — CV Procedure (Signed)
    Transesophageal Echocardiogram Note  Rhemi Balbach [id number] 1946/04/18  Procedure: Transesophageal Echocardiogram Indications: atrial flutter  Procedure Details Consent: Obtained Time Out: Verified patient identification, verified procedure, site/side was marked, verified correct patient position, special equipment/implants available, Radiology Safety Procedures followed,  medications/allergies/relevent history reviewed, required imaging and test results available.  Performed  Medications: Fentanyl: 50 Versed: 4 mg  Left Ventrical:  Normal LV function  Mitral Valve: mild MR  Aortic Valve: normal AV  Tricuspid Valve: moderate TR  Pulmonic Valve: normal  Left Atrium/ Left atrial appendage: no thrombus  Atrial septum: + PFO,   Aorta: mild calcification   Complications: No apparent complications Patient did tolerate procedure well.  Will proceed with cardioversion.    Cardioversion Note  Jonika Critz [id number] March 26, 1946  Procedure: DC Cardioversion Indications: atrial flutter  Procedure Details Consent: Obtained Time Out: Verified patient identification, verified procedure, site/side was marked, verified correct patient position, special equipment/implants available, Radiology Safety Procedures followed,  medications/allergies/relevent history reviewed, required imaging and test results available.  Performed  The patient has been on adequate anticoagulation.  The patient received  Propofol 80 mg IV for sedation.  Synchronous cardioversion was performed at 75  joules.  The cardioversion was successful    Complications: No apparent complications Patient did tolerate procedure well.   Thayer Headings, Brooke Bonito., MD, [facility] 03/15/2013, 3:34 PM

## 2013-03-15 NOTE — Transfer of Care (Signed)
Immediate Anesthesia Transfer of Care Note  Patient: [name] [name]  Procedure(s) Performed: Procedure(s): TRANSESOPHAGEAL ECHOCARDIOGRAM (TEE) (N/A) CARDIOVERSION (N/A)  Patient Location: Endoscopy Unit  Anesthesia Type:MAC  Level of Consciousness: awake and oriented  Airway & Oxygen Therapy: Patient Spontanous Breathing and Patient connected to nasal cannula oxygen  Post-op Assessment: Report given to PACU RN  Post vital signs: Reviewed and stable  Complications: No apparent anesthesia complications

## 2013-03-15 NOTE — Discharge Summary (Signed)
Discharge Summary   Patient ID: [name] [name],  MRN: [id number], DOB/AGE: [birth year] 67 y.o.  Admit date: 03/14/2013 Discharge date: 03/15/2013  Primary Physician: [name], MD Primary Cardiologist: B. Crenshaw, MD  Discharge Diagnoses Principal Problem:   Atrial flutter Active Problems:   CORONARY ARTERY DISEASE   Sick sinus syndrome   Tachy-brady syndrome   Acute diastolic CHF (congestive heart failure)   PFO (patent foramen ovale)   HYPOTHYROIDISM   HYPERLIPIDEMIA   HYPERTENSION   C O P D   OBSTRUCTIVE SLEEP APNEA   Hypokalemia   Allergies Allergies  Allergen Reactions  . Codeine     REACTION: unknown - pt. states unaware of having reaction to codeine  . Lisinopril     REACTION: cough    Diagnostic Studies/Procedures  PA/LATERAL CHEST X-RAY - 03/14/13  IMPRESSION:  No acute cardiopulmonary disease. Mild cardiomegaly and lung  hyperexpansion. No change from prior study.  TEE/DCCV- 03/15/13  Left Ventrical: Normal LV function  Mitral Valve: mild MR  Aortic Valve: normal AV  Tricuspid Valve: moderate TR  Pulmonic Valve: normal  Left Atrium/ Left atrial appendage: no thrombus  Atrial septum: + PFO,  Aorta: mild calcification  Complications: No apparent complications  Patient did tolerate procedure well.  Will proceed with cardioversion.  Cardioversion Note  [name] [name]  [id number]  May 08, 1946  Procedure: DC Cardioversion  Indications: atrial flutter  Procedure Details  Consent: Obtained  Time Out: Verified patient identification, verified procedure, site/side was marked, verified correct patient position, special equipment/implants available, Radiology Safety Procedures followed, medications/allergies/relevent history reviewed, required imaging and test results available. Performed  The patient has been on adequate anticoagulation. The patient received Propofol 80 mg IV for sedation. Synchronous cardioversion was performed at 75 joules.  The  cardioversion was successful  History of Present Illness  [name] [name] is a 67 y.o. female who was admitted to [facility] on 03/13/13 with the above problem list.   She has a hx of CAD, HTN, HL, OSA, hypothyroidism, COPD on O2, L breast CA. LHC at [facility] 10/2005: pRCA 75%, LM < 25%, pLAD 25%, D1 25%, D2 50%. She suffered a NSTEMI in 01/2010 in the setting of SIRS from leg cellulitis. This was felt to be demand ischemia. Med Rx was pursued (felt to be a poor candidate for noninvasive imaging). She was evaluated for near syncope and SSS with pauses by EP in 05/2012. Beta blockers and Provera were stopped. Event monitor demonstrated no significant arrhythmias. Echo 02/2012: EF 55-60%, mild AI, MAC, mod LAE, PASP mildly increased. Last seen by Dr. Bing Quarry in 10/2012. Recently seen by her PCP and noted to be in AFlutter with RVR. Coumadin has been started and reached a therapeutic level 03/13/13.   She presented to our office yesterday. She had noticed increased heart rates, diaphoresis, increased dyspnea on exertion and LE edema x 2 weeks. She denied chset discomfort, lightheadedness or syncope. EKG in the office confirmed atrial flutter with rapid rates (HR 123). Given limited options for rate control with underlying SSS (pauses on BBs in the past), management was limited. Given new onset atrial flutter, the patient was also diagnosed with tachy-brady syndrome in this setting. Options were discussed with Dr. Ron Parker who recommended direct hospital admission with plans for EP consult and tentative TEE/DCCV the following day.   Hospital Course   She arrive in stable condition yesterday. She was given a dose of Lasix IV, and outpatient Lasix regimen was increased. CXR as above indicated no  acute cardiopulmonary findings, cardiomegaly and hyperinflation. Trop-I x 3 returned WNL. She was mildly hypokalemic this morning at 3.2 and was repleted. Coumadin was managed per pharmacy and remained therapeutic. She  was evaluated by Dr. Rayann Heman today who agreed with TEE/DCCV. This was performed this afternoon. There was no evidence of thrombus. The patient did have a + PFO and moderate TR. This resulted in successful conversion to NSR. She was deemed stable for discharge post-procedure by Dr. Acie Fredrickson.   She will follow-up at the [facility] office on 7/1 for previously scheduled INR check. INR remained therapeutic this admission (2.32 today). She will be discharged on an increased Lasix regimen with appopriate KCl supplementation. She will follow-up in our office as noted below. There were no available follow-up appointments for 14 day TCM. Of note, the patient does use albuterol inh PRN with underlying COPD. Will defer consideration of replacing with Xopenex to be addressed at her follow-up appointment.  Discharge Vitals:  Blood pressure 96/59, pulse 70, temperature 98.2 F (36.8 C), temperature source Oral, resp. rate 20, height 5' 5.5" (1.664 m), weight 155.584 kg (343 lb), SpO2 96.00%.   Labs: Recent Labs     03/14/13  1240  WBC  6.1  HGB  13.3  HCT  41.0  MCV  89.3  PLT  163   Recent Labs Lab 03/14/13 1240 03/15/13 0500  NA 142 140  K 3.5 3.2*  CL 97 100  CO2 33* 33*  BUN 13 13  CREATININE 0.86 0.77  CALCIUM 9.2 8.6  PROT 7.2  --   BILITOT 0.5  --   ALKPHOS 106  --   ALT 56*  --   AST 52*  --   GLUCOSE 120* 114*   No results found for this basename: HGBA1C,  in the last 72 hours Recent Labs     03/14/13  1240  03/14/13  1700  03/14/13  2154  TROPONINI  <0.30  <0.30  <0.30   Disposition:  Discharge Orders   Future Appointments Provider Department Dept Phone   03/20/2013 9:45 AM [facility] [phone]   04/06/2013 10:30 AM Liliane Shi, PA-C [place]) [phone]   04/20/2013 10:00 AM Lelon Perla, MD [place]) [phone]   05/02/2013 9:15 AM Biagio Borg, MD  [facility] [phone]   06/28/2013 11:30 AM [place] [phone]   06/28/2013 12:00 PM Jeanie Cooks, MD [place] [phone]   Future Orders Complete By Expires     Diet - low sodium heart healthy  As directed     Increase activity slowly  As directed           Follow-up Information   Follow up with [facility] On 03/20/2013. (At 9:45 AM for Coumadin check as previously scheduled.)    Contact information:   [place] [phone]       Follow up with Richardson Dopp, PA-C On 04/06/2013. (At 10:30 AM for post-hospital follow-up. )    Contact information:   [address] [place] [address] [phone]       Discharge Medications:    Medication List    STOP taking these medications       diltiazem 360 MG 24 hr capsule  Commonly known as:  DILTIAZEM HCL CD  TAKE these medications       albuterol 108 (90 BASE) MCG/ACT inhaler  Commonly known as:  PROVENTIL HFA;VENTOLIN HFA  Inhale 2 puffs into the lungs every 6 (six) hours as needed for wheezing.     allopurinol 300 MG tablet  Commonly known as:  ZYLOPRIM  Take 300 mg by mouth daily.     amitriptyline 100 MG tablet  Commonly known as:  ELAVIL  Take 100 mg by mouth at bedtime.     aspirin EC 81 MG tablet  Take 81 mg by mouth every morning.     cephALEXin 500 MG capsule  Commonly known as:  KEFLEX  Take 500 mg by mouth 4 (four) times daily.     DULoxetine 30 MG capsule  Commonly known as:  CYMBALTA  Take 60 mg by mouth at bedtime.     FISH OIL PO  Take 1 capsule by mouth at bedtime.     furosemide 40 MG tablet  Commonly known as:  LASIX  Take 1 tablet (40 mg total) by mouth 2 (two) times daily.     levothyroxine 200 MCG tablet  Commonly known as:  SYNTHROID, LEVOTHROID  Take 200 mcg by mouth daily.     potassium chloride SA 20 MEQ tablet    Commonly known as:  K-DUR,KLOR-CON  Take 1 tablet (20 mEq total) by mouth 2 (two) times daily.     vitamin C 500 MG tablet  Commonly known as:  ASCORBIC ACID  Take 500 mg by mouth at bedtime.     Vitamin D 2000 UNITS tablet  Take 2,000 Units by mouth at bedtime.     warfarin 5 MG tablet  Commonly known as:  COUMADIN  Take 5 mg by mouth every morning. 5 mg daily except 7.5 mg on Tues, Fri        Outstanding Labs/Studies: INR check 7/1  Duration of Discharge Encounter: Greater than 30 minutes including physician time.  Signed, R. Valeria Batman, PA-C 03/15/2013, 4:35 PM   Thompson Grayer MD

## 2013-03-15 NOTE — Progress Notes (Signed)
Placed pt. On CPAP via full face mask, auto titrate setting (min 5.0 max 20.0) with 2 lpm O2 bleed in.  Pt. Tolerating well at this time.

## 2013-03-15 NOTE — Interval H&P Note (Signed)
History and Physical Interval Note:  03/15/2013 3:08 PM  [name] [name]  has presented today for surgery, with the diagnosis of AFLUTTER WITH RVR  The various methods of treatment have been discussed with the patient and family. After consideration of risks, benefits and other options for treatment, the patient has consented to  Procedure(s): TRANSESOPHAGEAL ECHOCARDIOGRAM (TEE) (N/A) CARDIOVERSION (N/A) as a surgical intervention .  The patient's history has been reviewed, patient examined, no change in status, stable for surgery.  I have reviewed the patient's chart and labs.  Questions were answered to the patient's satisfaction.     [name].

## 2013-03-15 NOTE — H&P (View-Only) (Signed)
ELECTROPHYSIOLOGY CONSULT NOTE    Patient ID: [name] [name] MRN: [id number], DOB/AGE: [birth year] 67 y.o.  Admit date: 03/14/2013 Date of Consult: 03-15-2013  Primary Physician: [name], MD Primary Cardiologist: Kirk Ruths (formerly Addison Lank)  Reason for Consultation: atrial flutter  HPI:  [name] [name] is a 67 year old female with a hsitory of CAD, HTN, HL, OSA, hypothyroidism, COPD on O2, L breast CA. LHC at [facility] 10/2005: pRCA 75%, LM < 25%, pLAD 25%, D1 25%, D2 50%. She suffered a NSTEMI in 01/2010 in the setting of SIRS from leg cellulitis. This was felt to be demand ischemia. Med Rx was pursued (felt to be a poor candidate for noninvasive imaging).   She has noted increased heart rates over the last 2 weeks. This has been noted on her pulse oximeter. She feels shaky and clammy at times. She may note increased dyspnea with exertion. She does have chronic dyspnea related to her COPD. She denies orthopnea or PND. She has noted increased LE edema. She has an unusual chest sensation from time to time. She describes it as throbbing. She denies exertional heaviness or tightness. She denies any associated radiation or nausea. She denies syncope. She has recently been treated for a UTI.  She is confident that she has not had tachycardia in the past because of the frequency of checks with her home pulse ox.  She was seen by her PCP about 2 weeks ago and was found to be in atypical flutter with RVR.  She was started on Diltiazem and Warfarin without improvement in ventricular rates.  She was referred to cardiology for further evaluation.  She was seen in the office by Richardson Dopp, PA yesterday who admitted her with plans for TEE/DCCV today.   She has had episodes of near syncope last year associated with sinus bradycardia.  At that time, her beta blockers were discontinued.  She wore an outpatient event monitor with no further bradyarrhythmias noted.   Last echo in 02-2012 demonstrated an EF of  55-60% with no RWMA.  LA 55m.   EP has been asked to evaluate for treatment options.  ROS of systems is negative except as outlined above.    Past Medical History  Diagnosis Date  . Hypertension   . Hyperlipidemia   . Anemia   . CAD (coronary artery disease)     a. Nonobstructive CAD 2007 (EF 75%.  RCA  proximal 75% tubular, 25% prox LAD, 25% d1, 50% D2[facility] b. NSTEMI 01/2010 in setting of respiratory failure, medical approach (not a good candidate for noninvasive eval)  . OSA on CPAP   . Hypothyroidism   . Peripheral edema 06/13/2011  . Asthma 06/13/2011  . Depression 06/13/2011  . Neuropathy 06/13/2011  . Cervical radiculopathy 06/13/2011  . Gout 06/13/2011  . Obesity hypoventilation syndrome 06/13/2011  . COPD (chronic obstructive pulmonary disease)     on O2  . Breast cancer dx'd 2005    left  . Cellulitis     hx of cellulitis in left leg  . Impaired glucose tolerance 11/01/2012  . Dysrhythmia     atrial fibrilation     Surgical History:  Past Surgical History  Procedure Laterality Date  . Ovarian cyst removal    . Breast lumpectomy    . Cervical disc surgery      have cadaver bones,, screws, and plates   . [facility]  .  Dilation and curettage of uterus    . Colonoscopy    . Laparoscopic appendectomy  03/29/2012    Procedure: APPENDECTOMY LAPAROSCOPIC;  Surgeon: Adin Hector, MD;  Location: [facility] ORS;  Service: General;  Laterality: N/A;     Prescriptions prior to admission  Medication Sig Dispense Refill  . albuterol (PROVENTIL HFA;VENTOLIN HFA) 108 (90 BASE) MCG/ACT inhaler Inhale 2 puffs into the lungs every 6 (six) hours as needed for wheezing.      [name] allopurinol (ZYLOPRIM) 300 MG tablet Take 300 mg by mouth daily.      [name] amitriptyline (ELAVIL) 100 MG tablet Take 100 mg by mouth at bedtime.      [name] aspirin EC 81 MG tablet Take 81 mg by mouth every morning.      . Cholecalciferol (VITAMIN D) 2000 UNITS tablet Take  2,000 Units by mouth at bedtime.      [name] diltiazem (DILTIAZEM HCL CD) 360 MG 24 hr capsule Take 1 capsule (360 mg total) by mouth daily.  90 capsule  3  . DULoxetine (CYMBALTA) 30 MG capsule Take 60 mg by mouth at bedtime.      . furosemide (LASIX) 40 MG tablet Take 1 tablet (40 mg total) by mouth daily.  1 tablet    . levothyroxine (SYNTHROID, LEVOTHROID) 200 MCG tablet Take 200 mcg by mouth daily.       . Omega-3 Fatty Acids (FISH OIL PO) Take 1 capsule by mouth at bedtime.      . potassium chloride SA (KLOR-CON M20) 20 MEQ tablet Take 1 tablet (20 mEq total) by mouth daily.  30 tablet  11  . vitamin C (ASCORBIC ACID) 500 MG tablet Take 500 mg by mouth at bedtime.      [name] warfarin (COUMADIN) 5 MG tablet Take 5 mg by mouth every morning. 5 mg daily except 7.5 mg on Tues, Fri      . cephALEXin (KEFLEX) 500 MG capsule Take 500 mg by mouth 4 (four) times daily.        Inpatient Medications:  . allopurinol  300 mg Oral Daily  . amitriptyline  100 mg Oral QHS  . aspirin EC  81 mg Oral Daily  . diltiazem  360 mg Oral Daily  . DULoxetine  60 mg Oral Daily  . furosemide  40 mg Oral BID  . levothyroxine  200 mcg Oral QAC breakfast  . potassium chloride  20 mEq Oral BID  . sodium chloride  3 mL Intravenous Q12H  . sodium chloride  3 mL Intravenous Q12H  . Warfarin - Pharmacist Dosing Inpatient   Does not apply q1800    Allergies:  Allergies  Allergen Reactions  . Codeine     REACTION: unknown - pt. states unaware of having reaction to codeine  . Lisinopril     REACTION: cough    History   Social History  . Marital Status: Divorced    Spouse Name: N/A    Number of Children: N/A  . Years of Education: N/A   Occupational History  . Not on file.   Social History Main Topics  . Smoking status: Former Smoker -- 1.00 packs/day for 20 years    Types: Cigarettes    Quit date: 09/20/1998  . Smokeless tobacco: Never Used     Comment: 1ppd x 20 years  . Alcohol Use: Yes     Comment:  rare  . Drug Use: No  . Sexually Active: Not Currently    Birth Control/  Protection: Post-menopausal   Other Topics Concern  . Not on file   Social History Narrative  . No narrative on file     Family History  Problem Relation Age of Onset  . Breast cancer    . Uterine cancer Mother   . Cancer Mother     Theadora Rama  . Heart disease Mother   . Cancer Sister 58    breast  . Diabetes Brother   . Cancer Maternal Aunt     breast  . Cancer Cousin 40    breast      Labs:   Lab Results  Component Value Date   WBC 6.1 03/14/2013   HGB 13.3 03/14/2013   HCT 41.0 03/14/2013   MCV 89.3 03/14/2013   PLT 163 03/14/2013    Recent Labs Lab 03/14/13 1240  NA 142  K 3.5  CL 97  CO2 33*  BUN 13  CREATININE 0.86  CALCIUM 9.2  PROT 7.2  BILITOT 0.5  ALKPHOS 106  ALT 56*  AST 52*  GLUCOSE 120*   Lab Results  Component Value Date   CKTOTAL 202* 03/28/2012   CKMB 2.7 03/28/2012   TROPONINI <0.30 03/14/2013   Lab Results  Component Value Date   CHOL 160 10/05/2012   CHOL 174 12/28/2011   CHOL  Value: 115        ATP III CLASSIFICATION:  <200     mg/dL   Desirable  200-239  mg/dL   Borderline High  >=240    mg/dL   High        02/13/2010   Lab Results  Component Value Date   HDL 48.30 10/05/2012   HDL 46.30 12/28/2011   HDL 45 02/13/2010   Lab Results  Component Value Date   LDLCALC 89 10/05/2012   LDLCALC 96 12/28/2011   LDLCALC  Value: 51        Total Cholesterol/HDL:CHD Risk Coronary Heart Disease Risk Table                     Men   Women  1/2 Average Risk   3.4   3.3  Average Risk       5.0   4.4  2 X Average Risk   9.6   7.1  3 X Average Risk  23.4   11.0        Use the calculated Patient Ratio above and the CHD Risk Table to determine the patient's CHD Risk.        ATP III CLASSIFICATION (LDL):  <100     mg/dL   Optimal  100-129  mg/dL   Near or Above                    Optimal  130-159  mg/dL   Borderline  160-189  mg/dL   High  >190     mg/dL   Very High 02/13/2010   Lab Results    Component Value Date   TRIG 116.0 10/05/2012   TRIG 159.0* 12/28/2011   TRIG 93 02/13/2010   Lab Results  Component Value Date   CHOLHDL 3 10/05/2012   CHOLHDL 4 12/28/2011   CHOLHDL 2.6 02/13/2010   No results found for this basename: LDLDIRECT    Lab Results  Component Value Date   DDIMER 0.58* 03/27/2012     Radiology/Studies: X-ray Chest Pa And Lateral 03/14/2013   *RADIOLOGY REPORT*  Clinical Data: Dyspnea  CHEST - 2 VIEW  Comparison:  03/01/2013  Findings: The cardiac silhouette is mildly enlarged.  Mediastinum is normal in contour caliber.  No hilar masses.  The lungs hyperexpanded with flattened hemidiaphragms.  The lungs are clear.  No pleural effusion or pneumothorax.  The bony thorax is intact.  IMPRESSION: No acute cardiopulmonary disease.  Mild cardiomegaly and lung hyperexpansion.  No change from prior study.   Original Report Authenticated By: Lajean Manes, M.D.   EKG: atypical atrial flutter, rate 130  TELEMETRY: atrial flutter with RVR Physical Exam: Filed Vitals:   03/14/13 1932 03/14/13 2140 03/14/13 2145 03/15/13 0400  BP: 144/91   126/76  Pulse: 125 120 120 122  Temp: 97.4 F (36.3 C)   97.8 F (36.6 C)  TempSrc: Oral   Oral  Resp: 18 18 18 18   Height:      Weight:      SpO2: 97% 98% 98% 98%    GEN- The patient is morbidly appearing, alert and oriented x 3 today.   Head- normocephalic, atraumatic Eyes-  Sclera clear, conjunctiva pink Ears- hearing intact Oropharynx- clear Neck- supple,  Lungs- Clear to ausculation bilaterally, normal work of breathing Heart- tachycardic irregular rhythm, decreased heart sounds GI- soft, NT, ND, + BS Extremities- no clubbing, cyanosis, + edema MS- no significant deformity or atrophy Skin- no rash or lesion Psych- euthymic mood, full affect Neuro- strength and sensation are intact  Assessment and Plan:  1. Atypical atrial flutter Clinically stable, anticoagulated Rates are stable  Given lung disease, morbid obesity,  and morbid obesity, she would be a very poor candidate for ablation.  I would therefore advise TEE guided cardioversion at this time.  Risks, benefits, and alternatives to the procedure were discussed at length with the patient who wishes to proceed.  2. Sick sinus syndrome Stop diltiazem  3. Chronic lung disease Continue O2  4. Morbid obesity- would benefit from weight loss.  5. HTN Stable No change required today  He should be able to go home later today.

## 2013-03-15 NOTE — Progress Notes (Signed)
ANTICOAGULATION CONSULT NOTE - [place] for Coumadin (heparin if INR < 2) Indication: atrial fibrillation  Allergies  Allergen Reactions  . Codeine     REACTION: unknown - pt. states unaware of having reaction to codeine  . Lisinopril     REACTION: cough    Patient Measurements: Height: 166 cm Weight: 155.6 kg Heparin Dosing Weight: 97.3 kg  Vital Signs: Temp: 97.8 F (36.6 C) (06/26 0400) Temp src: Oral (06/26 0400) BP: 126/76 mmHg (06/26 0400) Pulse Rate: 122 (06/26 0400)  Labs:  Recent Labs  03/13/13 0902 03/14/13 1240 03/14/13 1700 03/14/13 2154 03/15/13 0500  HGB  --  13.3  --   --   --   HCT  --  41.0  --   --   --   PLT  --  163  --   --   --   APTT  --  42*  --   --   --   LABPROT  --  28.4*  --   --  24.7*  INR 2.9 2.78*  --   --  2.32*  CREATININE  --  0.86  --   --  0.77  TROPONINI  --  <0.30 <0.30 <0.30  --     Estimated Creatinine Clearance: 106.1 ml/min (by C-G formula based on Cr of 0.77).   Medical History: Past Medical History  Diagnosis Date  . Hypertension   . Hyperlipidemia   . Anemia   . CAD (coronary artery disease)     a. Nonobstructive CAD 2007 (EF 75%.  RCA  proximal 75% tubular, 25% prox LAD, 25% d1, 50% D2) at [facility]. b. NSTEMI 01/2010 in setting of respiratory failure, medical approach (not a good candidate for noninvasive eval)  . OSA on CPAP   . Hypothyroidism   . Peripheral edema 06/13/2011  . Asthma 06/13/2011  . Depression 06/13/2011  . Neuropathy 06/13/2011  . Cervical radiculopathy 06/13/2011  . Gout 06/13/2011  . Obesity hypoventilation syndrome 06/13/2011  . COPD (chronic obstructive pulmonary disease)     on O2  . Breast cancer dx'd 2005    left  . Cellulitis     hx of cellulitis in left leg  . Impaired glucose tolerance 11/01/2012  . Dysrhythmia     atrial fibrilation    Medications: (not verified with patient yet) Prescriptions prior to admission  Medication Sig Dispense Refill  .  albuterol (PROVENTIL HFA;VENTOLIN HFA) 108 (90 BASE) MCG/ACT inhaler Inhale 2 puffs into the lungs every 6 (six) hours as needed for wheezing.      [name] allopurinol (ZYLOPRIM) 300 MG tablet Take 300 mg by mouth daily.      [name] amitriptyline (ELAVIL) 100 MG tablet Take 100 mg by mouth at bedtime.      [name] aspirin EC 81 MG tablet Take 81 mg by mouth every morning.      . Cholecalciferol (VITAMIN D) 2000 UNITS tablet Take 2,000 Units by mouth at bedtime.      [name] diltiazem (DILTIAZEM HCL CD) 360 MG 24 hr capsule Take 1 capsule (360 mg total) by mouth daily.  90 capsule  3  . DULoxetine (CYMBALTA) 30 MG capsule Take 60 mg by mouth at bedtime.      . furosemide (LASIX) 40 MG tablet Take 1 tablet (40 mg total) by mouth daily.  1 tablet    . levothyroxine (SYNTHROID, LEVOTHROID) 200 MCG tablet Take 200 mcg by mouth daily.       [name]  Omega-3 Fatty Acids (FISH OIL PO) Take 1 capsule by mouth at bedtime.      . potassium chloride SA (KLOR-CON M20) 20 MEQ tablet Take 1 tablet (20 mEq total) by mouth daily.  30 tablet  11  . vitamin C (ASCORBIC ACID) 500 MG tablet Take 500 mg by mouth at bedtime.      [name] warfarin (COUMADIN) 5 MG tablet Take 5 mg by mouth every morning. 5 mg daily except 7.5 mg on Tues, Fri      . cephALEXin (KEFLEX) 500 MG capsule Take 500 mg by mouth 4 (four) times daily.        Assessment: 67 y/o female admitted for evaluation of AFlutter. She was started on Coumadin 03/01/13. Last Coumadin Clinic note states patient is to take Coumadin 5 mg daily except 7.5 mg on Tues, Fri. INR 2.32 today (therapeutic), but trending down. No bleeding noted, CBC is wnl.  Spoke with patient and confirmed home regimen. She took her Coumadin today so I will not give her any tonight. She is agreeable to change the time she takes Coumadin to evening/bedtime.  Noted plans for TEE/DCCV today.  Goal of Therapy:  INR 2-3 Heparin level 0.3-0.7 units/ml (pharmacy to start heparin if INR falls <2) Monitor platelets by  anticoagulation protocol: Yes   Plan:  -No heparin drip as INR >2 -Coumadin 7.5 mg x 1 tonight. -INR daily -Monitor for signs/symptoms of bleeding  Uvaldo Rising, BCPS  Clinical Pharmacist Pager [phone]  03/15/2013 8:44 AM

## 2013-03-15 NOTE — Anesthesia Preprocedure Evaluation (Addendum)
Anesthesia Evaluation  Patient identified by MRN, date of birth, ID band Patient awake    Reviewed: Allergy & Precautions, H&P , NPO status , Patient's Chart, lab work & pertinent test results  History of Anesthesia Complications Negative for: history of anesthetic complications  Airway Mallampati: III TM Distance: <3 FB Neck ROM: Full    Dental  (+) Teeth Intact and Dental Advisory Given   Pulmonary asthma , sleep apnea and Continuous Positive Airway Pressure Ventilation , COPD COPD inhaler,    + decreased breath sounds      Cardiovascular hypertension, + CAD ('07 cath: non-obstructive ASCADz, EF 75%) and + Past MI ('11 NSTEMI during resp event) + dysrhythmias (INR 2.32) Atrial Fibrillation Rhythm:Irregular Rate:Normal     Neuro/Psych    GI/Hepatic   Endo/Other  Hypothyroidism Morbid obesity  Renal/GU negative Renal ROS     Musculoskeletal   Abdominal (+) + obese,   Peds  Hematology   Anesthesia Other Findings   Reproductive/Obstetrics                        Anesthesia Physical Anesthesia Plan  ASA: IV  Anesthesia Plan: General   Post-op Pain Management:    Induction: Intravenous  Airway Management Planned: Natural Airway and Mask  Additional Equipment:   Intra-op Plan:   Post-operative Plan:   Informed Consent:   Dental advisory given  Plan Discussed with: CRNA and Surgeon  Anesthesia Plan Comments:       Anesthesia Quick Evaluation

## 2013-03-15 NOTE — Anesthesia Postprocedure Evaluation (Signed)
  Anesthesia Post-op Note  Patient: [name] [name]  Procedure(s) Performed: Procedure(s): TRANSESOPHAGEAL ECHOCARDIOGRAM (TEE) (N/A) CARDIOVERSION (N/A)  Patient Location: Endoscopy Unit  Anesthesia Type:MAC  Level of Consciousness: awake and oriented  Airway and Oxygen Therapy: Patient Spontanous Breathing and Patient connected to nasal cannula oxygen  Post-op Pain: none  Post-op Assessment: Post-op Vital signs reviewed  Post-op Vital Signs: Reviewed and stable  Complications: No apparent anesthesia complications

## 2013-03-19 ENCOUNTER — Encounter (HOSPITAL_COMMUNITY): Payer: Self-pay | Admitting: Cardiovascular Disease

## 2013-03-20 ENCOUNTER — Ambulatory Visit (INDEPENDENT_AMBULATORY_CARE_PROVIDER_SITE_OTHER): Payer: Medicare Other | Admitting: General Practice

## 2013-03-20 DIAGNOSIS — R000 Tachycardia, unspecified: Secondary | ICD-10-CM

## 2013-03-20 DIAGNOSIS — Z7901 Long term (current) use of anticoagulants: Secondary | ICD-10-CM | POA: Diagnosis not present

## 2013-03-20 DIAGNOSIS — I4892 Unspecified atrial flutter: Secondary | ICD-10-CM | POA: Diagnosis not present

## 2013-03-27 ENCOUNTER — Ambulatory Visit (INDEPENDENT_AMBULATORY_CARE_PROVIDER_SITE_OTHER): Payer: Medicare Other | Admitting: General Practice

## 2013-03-27 DIAGNOSIS — I4892 Unspecified atrial flutter: Secondary | ICD-10-CM

## 2013-03-27 DIAGNOSIS — R000 Tachycardia, unspecified: Secondary | ICD-10-CM | POA: Diagnosis not present

## 2013-03-27 DIAGNOSIS — Z7901 Long term (current) use of anticoagulants: Secondary | ICD-10-CM | POA: Diagnosis not present

## 2013-04-06 ENCOUNTER — Ambulatory Visit (INDEPENDENT_AMBULATORY_CARE_PROVIDER_SITE_OTHER): Payer: Medicare Other | Admitting: Physician Assistant

## 2013-04-06 ENCOUNTER — Encounter: Payer: Self-pay | Admitting: Physician Assistant

## 2013-04-06 VITALS — BP 130/79 | HR 92 | Ht 65.5 in | Wt 335.0 lb

## 2013-04-06 DIAGNOSIS — I4892 Unspecified atrial flutter: Secondary | ICD-10-CM

## 2013-04-06 DIAGNOSIS — I5032 Chronic diastolic (congestive) heart failure: Secondary | ICD-10-CM | POA: Diagnosis not present

## 2013-04-06 DIAGNOSIS — I2510 Atherosclerotic heart disease of native coronary artery without angina pectoris: Secondary | ICD-10-CM

## 2013-04-06 DIAGNOSIS — N898 Other specified noninflammatory disorders of vagina: Secondary | ICD-10-CM

## 2013-04-06 DIAGNOSIS — N939 Abnormal uterine and vaginal bleeding, unspecified: Secondary | ICD-10-CM

## 2013-04-06 LAB — BASIC METABOLIC PANEL
BUN: 9 mg/dL (ref 6–23)
CO2: 31 mEq/L (ref 19–32)
Calcium: 8.8 mg/dL (ref 8.4–10.5)
Chloride: 99 mEq/L (ref 96–112)
Creatinine, Ser: 0.8 mg/dL (ref 0.4–1.2)
Glucose, Bld: 86 mg/dL (ref 70–99)

## 2013-04-06 NOTE — Patient Instructions (Addendum)
Your physician recommends that you have lab work today--BMET.  Schedule an appointment with Dr Quincy Simmonds or [facility] to follow-up on your vaginal bleeding.   Your physician recommends that you schedule a follow-up appointment with Dr Stanford Breed in 2 months. You can cancel the appointment you have scheduled with Dr Stanford Breed 04/20/13.

## 2013-04-06 NOTE — Progress Notes (Signed)
[facility]. [address] [place], [place]  [address] Phone: [phone] Fax:  [phone]  Date:  04/06/2013   ID:  Jonnell Hentges, DOB 18-May-1946, MRN [id number]  PCP:  [name], MD  Cardiologist:  Dr.  Bing Quarry => Dr. Kirk Ruths   Electrophysiologist:  Dr. Thompson Grayer   History of Present Illness: [name] [name] is a 67 y.o. female who returns for evaluation of AFlutter with RVR.  She has a hx of CAD, HTN, HL, OSA, hypothyroidism, COPD on O2, L breast CA.  LHC at [facility] 10/2005:  pRCA 75%, LM < 25%, pLAD 25%, D1 25%, D2 50%.  She suffered a NSTEMI in 01/2010 in the setting of SIRS from leg cellulitis.  This was felt to be demand ischemia.  Med Rx was pursued (felt to be a poor candidate for noninvasive imaging).  She was evaluated for near syncope and SSS with pauses by EP in 05/2012.  Beta blockers and Provera were stopped.  Event monitor demonstrated no significant arrhythmias.  Echo 02/2012:  EF 55-60%, mild AI, MAC, mod LAE, PASP mildly increased.    She was recently admitted from the office 6/25-6/26 with symptomatic atrial flutter with RVR. She was also volume overloaded. She was diuresed. Cardiac markers remained normal. INR was therapeutic. She was seen by electrophysiology. She was felt to be a poor candidate for atrial flutter ablation.  TEE guided cardioversion was performed. She had restoration of NSR.  TEE 03/15/13: EF 55-60%, no LA clot, positive PFO, mild to moderate TR.  Since discharge, she is doing better. She continues to note dyspnea with exertion. She denies palpitations. She denies chest pain, syncope. She sleeps with CPAP. LE edema is reduced.  Labs (1/14):  LDL 89 Labs (6/14):  K 3.6, Cr 0.9, ALT 51, Hgb 13.6, TSH 1.19,   Wt Readings from Last 3 Encounters:  04/06/13 335 lb (151.955 kg)  03/14/13 343 lb (155.584 kg)  03/14/13 343 lb (155.584 kg)     Past Medical History  Diagnosis Date  . Hypertension   . Hyperlipidemia   . Anemia   . CAD  (coronary artery disease)     a. Nonobstructive CAD 2007 (EF 75%.  RCA  proximal 75% tubular, 25% prox LAD, 25% d1, 50% D2) at [facility]. b. NSTEMI 01/2010 in setting of respiratory failure, medical approach (not a good candidate for noninvasive eval)  . OSA on CPAP   . Hypothyroidism   . Peripheral edema 06/13/2011  . Asthma 06/13/2011  . Depression 06/13/2011  . Neuropathy 06/13/2011  . Cervical radiculopathy 06/13/2011  . Gout 06/13/2011  . Obesity hypoventilation syndrome 06/13/2011  . COPD (chronic obstructive pulmonary disease)     on O2  . Breast cancer dx'd 2005    left  . Cellulitis     hx of cellulitis in left leg  . Impaired glucose tolerance 11/01/2012  . Dysrhythmia     atrial fibrilation  . PFO (patent foramen ovale)     Current Outpatient Prescriptions  Medication Sig Dispense Refill  . albuterol (PROVENTIL HFA;VENTOLIN HFA) 108 (90 BASE) MCG/ACT inhaler Inhale 2 puffs into the lungs every 6 (six) hours as needed for wheezing.      [name] allopurinol (ZYLOPRIM) 300 MG tablet Take 300 mg by mouth daily.      [name] amitriptyline (ELAVIL) 100 MG tablet Take 100 mg by mouth at bedtime.      [name] aspirin EC 81 MG tablet Take 81 mg by mouth every morning.      [name]  Cholecalciferol (VITAMIN D) 2000 UNITS tablet Take 2,000 Units by mouth at bedtime.      . DULoxetine (CYMBALTA) 30 MG capsule Take 60 mg by mouth at bedtime.      . furosemide (LASIX) 40 MG tablet Take 1 tablet (40 mg total) by mouth 2 (two) times daily.  60 tablet  3  . levothyroxine (SYNTHROID, LEVOTHROID) 200 MCG tablet Take 200 mcg by mouth daily.       . Omega-3 Fatty Acids (FISH OIL PO) Take 1 capsule by mouth at bedtime.      . potassium chloride SA (K-DUR,KLOR-CON) 20 MEQ tablet Take 1 tablet (20 mEq total) by mouth 2 (two) times daily.  60 tablet  3  . vitamin C (ASCORBIC ACID) 500 MG tablet Take 500 mg by mouth at bedtime.      [name] warfarin (COUMADIN) 5 MG tablet Take 5 mg by mouth every morning. 5 mg daily except 7.5 mg on Tues,  Fri       No current facility-administered medications for this visit.    Allergies:    Allergies  Allergen Reactions  . Codeine     REACTION: unknown - pt. states unaware of having reaction to codeine  . Lisinopril     REACTION: cough    Social History:  The patient  reports that she quit smoking about 14 years ago. Her smoking use included Cigarettes. She has a 20 pack-year smoking history. She has never used smokeless tobacco. She reports that  drinks alcohol. She reports that she does not use illicit drugs.    ROS:  Please see the history of present illness.   She did note some recent vaginal bleeding that was transient.   All other systems reviewed and negative.   PHYSICAL EXAM: VS:  BP 130/79  Pulse 92  Ht 5' 5.5" (1.664 m)  Wt 335 lb (151.955 kg)  BMI 54.88 kg/m2 Well nourished, well developed, in no acute distress HEENT: normal Neck: JVD cannot be assessed Cardiac:  normal S1, S2; RRR; no murmur Lungs:  Decreased breath sounds bilaterally, no wheezing, rhonchi or rales Abd: soft, nontender  Ext: trace bilateral LE edema Skin: warm and dry Neuro:  CNs 2-12 intact, no focal abnormalities noted  EKG:  NSR, HR 92, normal axis, no acute changes     ASSESSMENT AND PLAN:  1. Atrial Flutter with RVR:  Maintaining sinus rhythm. Continue Coumadin. She is not on any rate controlling medications due to history of sick sinus syndrome.  2. CAD: no angina. Continue aspirin. 3. Diastolic CHF:  Volume stable. Continue current therapy. Check basic metabolic panel today. 4. COPD: Continue current regimen and oxygen. 5. Hypertension: Controlled. 6. OSA: Continue BiPAP. 7. Vaginal Bleeding:  I have advised her to follow up with gynecology. 8. Disposition:  Follow up with Dr. Stanford Breed in 2 months.  Signed, Richardson Dopp, PA-C  04/06/2013 11:21 AM

## 2013-04-09 ENCOUNTER — Other Ambulatory Visit: Payer: Self-pay | Admitting: Internal Medicine

## 2013-04-10 ENCOUNTER — Ambulatory Visit (INDEPENDENT_AMBULATORY_CARE_PROVIDER_SITE_OTHER): Payer: Medicare Other | Admitting: General Practice

## 2013-04-10 DIAGNOSIS — I4892 Unspecified atrial flutter: Secondary | ICD-10-CM

## 2013-04-10 DIAGNOSIS — R000 Tachycardia, unspecified: Secondary | ICD-10-CM | POA: Diagnosis not present

## 2013-04-10 DIAGNOSIS — Z7901 Long term (current) use of anticoagulants: Secondary | ICD-10-CM | POA: Diagnosis not present

## 2013-04-13 ENCOUNTER — Other Ambulatory Visit: Payer: Self-pay | Admitting: Internal Medicine

## 2013-04-20 ENCOUNTER — Ambulatory Visit: Payer: Medicare Other | Admitting: Cardiology

## 2013-04-25 ENCOUNTER — Other Ambulatory Visit: Payer: Self-pay

## 2013-05-01 ENCOUNTER — Ambulatory Visit (INDEPENDENT_AMBULATORY_CARE_PROVIDER_SITE_OTHER): Payer: Medicare Other | Admitting: General Practice

## 2013-05-01 DIAGNOSIS — I4892 Unspecified atrial flutter: Secondary | ICD-10-CM | POA: Diagnosis not present

## 2013-05-01 DIAGNOSIS — R000 Tachycardia, unspecified: Secondary | ICD-10-CM | POA: Diagnosis not present

## 2013-05-01 DIAGNOSIS — Z7901 Long term (current) use of anticoagulants: Secondary | ICD-10-CM

## 2013-05-02 ENCOUNTER — Encounter: Payer: Self-pay | Admitting: Internal Medicine

## 2013-05-02 ENCOUNTER — Ambulatory Visit (INDEPENDENT_AMBULATORY_CARE_PROVIDER_SITE_OTHER): Payer: Medicare Other | Admitting: Internal Medicine

## 2013-05-02 VITALS — BP 132/72 | HR 103 | Temp 97.9°F | Wt 341.2 lb

## 2013-05-02 DIAGNOSIS — I10 Essential (primary) hypertension: Secondary | ICD-10-CM | POA: Diagnosis not present

## 2013-05-02 DIAGNOSIS — N950 Postmenopausal bleeding: Secondary | ICD-10-CM

## 2013-05-02 DIAGNOSIS — E785 Hyperlipidemia, unspecified: Secondary | ICD-10-CM

## 2013-05-02 DIAGNOSIS — E039 Hypothyroidism, unspecified: Secondary | ICD-10-CM

## 2013-05-02 NOTE — Assessment & Plan Note (Addendum)
Lab Results  Component Value Date   LDLCALC 89 10/05/2012   stable overall by history and exam, recent data reviewed with pt, and pt to continue medical treatment as before,  to f/u any worsening symptoms or concerns

## 2013-05-02 NOTE — Assessment & Plan Note (Signed)
stable overall by history and exam, recent data reviewed with pt, and pt to continue medical treatment as before,  to f/u any worsening symptoms or concerns Lab Results  Component Value Date   TSH 0.33* 03/01/2013

## 2013-05-02 NOTE — Addendum Note (Signed)
Addended by: Biagio Borg on: 05/02/2013 09:48 AM   Modules accepted: Orders, Level of Service

## 2013-05-02 NOTE — Assessment & Plan Note (Signed)
stable overall by history and exam, recent data reviewed with pt, and pt to continue medical treatment as before,  to f/u any worsening symptoms or concerns BP Readings from Last 3 Encounters:  05/02/13 132/72  04/06/13 130/79  03/15/13 96/59

## 2013-05-02 NOTE — Progress Notes (Signed)
Subjective:    Patient ID: [name] [name], female    DOB: 1946-07-23, 67 y.o.   MRN: [id number]  HPI  Here to f/u s/p cardioversion, had some stress at home, with occasional elev BP, and HR  To 101 occasinoally even at rest; Pt denies chest pain, increased sob or doe, wheezing, orthopnea, PND, increased LE swelling, palpitations, dizziness or syncope. On coumadin for now, INR 2.2  Yesterday.  No overt bleeding or bruising except for vaingal bleeding, did not f/u with GYN but willing to do now as it seems to have been worse in the past few days.Did have neg pelvic u/s approx 2 yrs ago.  Denies hyper or hypo thyroid symptoms such as voice, skin or hair change.  Past Medical History  Diagnosis Date  . Hypertension   . Hyperlipidemia   . Anemia   . CAD (coronary artery disease)     a. Nonobstructive CAD 2007 (EF 75%.  RCA  proximal 75% tubular, 25% prox LAD, 25% d1, 50% D2) at [facility]. b. NSTEMI 01/2010 in setting of respiratory failure, medical approach (not a good candidate for noninvasive eval)  . OSA on CPAP   . Hypothyroidism   . Peripheral edema 06/13/2011  . Asthma 06/13/2011  . Depression 06/13/2011  . Neuropathy 06/13/2011  . Cervical radiculopathy 06/13/2011  . Gout 06/13/2011  . Obesity hypoventilation syndrome 06/13/2011  . COPD (chronic obstructive pulmonary disease)     on O2  . Breast cancer dx'd 2005    left  . Cellulitis     hx of cellulitis in left leg  . Impaired glucose tolerance 11/01/2012  . Dysrhythmia     atrial fibrilation  . PFO (patent foramen ovale)    Past Surgical History  Procedure Laterality Date  . Ovarian cyst removal    . Breast lumpectomy    . Cervical disc surgery      have cadaver bones,, screws, and plates   . Cervical biopsy  June 2013    at [facility] for [facility]  Bleeding  . Dilation and curettage of uterus    . Colonoscopy    . Laparoscopic appendectomy  03/29/2012    Procedure: APPENDECTOMY LAPAROSCOPIC;  Surgeon: Adin Hector, MD;  Location: [facility] ORS;   Service: General;  Laterality: N/A;  . Tee without cardioversion N/A 03/15/2013    Procedure: TRANSESOPHAGEAL ECHOCARDIOGRAM (TEE);  Surgeon: Thayer Headings, MD;  Location: [place];  Service: Cardiovascular;  Laterality: N/A;  . Cardioversion N/A 03/15/2013    Procedure: CARDIOVERSION;  Surgeon: Thayer Headings, MD;  Location: [facility] ENDOSCOPY;  Service: Cardiovascular;  Laterality: N/A;    reports that she quit smoking about 14 years ago. Her smoking use included Cigarettes. She has a 20 pack-year smoking history. She has never used smokeless tobacco. She reports that  drinks alcohol. She reports that she does not use illicit drugs. family history includes Breast cancer in an other family member; Cancer in her maternal aunt and mother; Cancer (age of onset: 53) in her sister; Cancer (age of onset: 34) in her cousin; Diabetes in her brother; Heart disease in her mother; Uterine cancer in her mother. Allergies  Allergen Reactions  . Codeine     REACTION: unknown - pt. states unaware of having reaction to codeine  . Lisinopril     REACTION: cough   Current Outpatient Prescriptions on File Prior to Visit  Medication Sig Dispense Refill  . albuterol (PROVENTIL HFA;VENTOLIN HFA) 108 (90 BASE) MCG/ACT inhaler Inhale 2 puffs into  the lungs every 6 (six) hours as needed for wheezing.      [name] allopurinol (ZYLOPRIM) 300 MG tablet Take 300 mg by mouth daily.      [name] amitriptyline (ELAVIL) 100 MG tablet TAKE 1 TABLET AT BEDTIME  90 tablet  1  . aspirin EC 81 MG tablet Take 81 mg by mouth every morning.      . Cholecalciferol (VITAMIN D) 2000 UNITS tablet Take 2,000 Units by mouth at bedtime.      . DULoxetine (CYMBALTA) 30 MG capsule TAKE 1 CAPSULE TWICE A DAY  180 capsule  3  . furosemide (LASIX) 40 MG tablet Take 1 tablet (40 mg total) by mouth 2 (two) times daily.  60 tablet  3  . levothyroxine (SYNTHROID, LEVOTHROID) 200 MCG tablet Take 200 mcg by mouth daily.       . Omega-3 Fatty Acids (FISH OIL PO)  Take 1 capsule by mouth at bedtime.      . potassium chloride SA (K-DUR,KLOR-CON) 20 MEQ tablet Take 1 tablet (20 mEq total) by mouth 2 (two) times daily.  60 tablet  3  . vitamin C (ASCORBIC ACID) 500 MG tablet Take 500 mg by mouth at bedtime.      [name] warfarin (COUMADIN) 5 MG tablet Take 5 mg by mouth every morning. 5 mg daily except 7.5 mg on Tues, Fri       No current facility-administered medications on file prior to visit.   Review of Systems  Constitutional: Negative for unexpected weight change, or unusual diaphoresis  HENT: Negative for tinnitus.   Eyes: Negative for photophobia and visual disturbance.  Respiratory: Negative for choking and stridor.   Gastrointestinal: Negative for vomiting and blood in stool.  Genitourinary: Negative for hematuria and decreased urine volume.  Musculoskeletal: Negative for acute joint swelling Skin: Negative for color change and wound.  Neurological: Negative for tremors and numbness other than noted  Psychiatric/Behavioral: Negative for decreased concentration or  hyperactivity.       Objective:   Physical Exam BP 132/72  Pulse 103  Temp(Src) 97.9 F (36.6 C) (Oral)  Wt 341 lb 4 oz (154.79 kg)  BMI 55.9 kg/m2  SpO2 96% VS noted, on home o2 Constitutional: Pt appears well-developed and well-nourished.  HENT: Head: NCAT.  Right Ear: External ear normal.  Left Ear: External ear normal.  Eyes: Conjunctivae and EOM are normal. Pupils are equal, round, and reactive to light.  Neck: Normal range of motion. Neck supple.  Cardiovascular: Normal rate and regular rhythm.   Pulmonary/Chest: Effort normal and breath sounds decreased, no rales or wheezing Neurological: Pt is alert. Not confused  Skin: Skin is warm. No erythema. chronic 1+ LE edema to mid leg Psychiatric: Pt behavior is normal. Thought content normal.     Assessment & Plan:

## 2013-05-02 NOTE — Assessment & Plan Note (Signed)
For gyn referral, f/o malignancy, on coumadin

## 2013-05-02 NOTE — Patient Instructions (Addendum)
Please continue all other medications as before, and refills have been done if requested. Please have the pharmacy call with any other refills you may need. No blood work needed today Please continue your efforts at being more active, low cholesterol diet, and weight control.  Please remember to sign up for My Chart if you have not done so, as this will be important to you in the future with finding out test results, communicating by private email, and scheduling acute appointments online when needed.  Please return in 6 months, or sooner if needed  Please keep your appointments with your specialists as you have planned  You will be contacted regarding the referral for: GYN

## 2013-05-06 ENCOUNTER — Emergency Department (HOSPITAL_COMMUNITY): Payer: Medicare Other

## 2013-05-06 ENCOUNTER — Encounter (HOSPITAL_COMMUNITY): Payer: Self-pay | Admitting: Emergency Medicine

## 2013-05-06 ENCOUNTER — Emergency Department (HOSPITAL_COMMUNITY)
Admission: EM | Admit: 2013-05-06 | Discharge: 2013-05-06 | Disposition: A | Discharge status: Home / Self Care | Payer: Medicare Other | Attending: Emergency Medicine | Admitting: Emergency Medicine

## 2013-05-06 DIAGNOSIS — Q2111 Secundum atrial septal defect: Secondary | ICD-10-CM | POA: Insufficient documentation

## 2013-05-06 DIAGNOSIS — J438 Other emphysema: Secondary | ICD-10-CM | POA: Diagnosis not present

## 2013-05-06 DIAGNOSIS — Q211 Atrial septal defect: Secondary | ICD-10-CM | POA: Diagnosis not present

## 2013-05-06 DIAGNOSIS — R5383 Other fatigue: Secondary | ICD-10-CM | POA: Insufficient documentation

## 2013-05-06 DIAGNOSIS — R109 Unspecified abdominal pain: Secondary | ICD-10-CM | POA: Insufficient documentation

## 2013-05-06 DIAGNOSIS — E039 Hypothyroidism, unspecified: Secondary | ICD-10-CM | POA: Insufficient documentation

## 2013-05-06 DIAGNOSIS — Z8679 Personal history of other diseases of the circulatory system: Secondary | ICD-10-CM | POA: Diagnosis not present

## 2013-05-06 DIAGNOSIS — Z79899 Other long term (current) drug therapy: Secondary | ICD-10-CM | POA: Insufficient documentation

## 2013-05-06 DIAGNOSIS — G4733 Obstructive sleep apnea (adult) (pediatric): Secondary | ICD-10-CM | POA: Insufficient documentation

## 2013-05-06 DIAGNOSIS — Z87891 Personal history of nicotine dependence: Secondary | ICD-10-CM | POA: Diagnosis not present

## 2013-05-06 DIAGNOSIS — Z8639 Personal history of other endocrine, nutritional and metabolic disease: Secondary | ICD-10-CM | POA: Insufficient documentation

## 2013-05-06 DIAGNOSIS — R55 Syncope and collapse: Secondary | ICD-10-CM | POA: Diagnosis not present

## 2013-05-06 DIAGNOSIS — N950 Postmenopausal bleeding: Secondary | ICD-10-CM | POA: Diagnosis not present

## 2013-05-06 DIAGNOSIS — Z8709 Personal history of other diseases of the respiratory system: Secondary | ICD-10-CM | POA: Diagnosis not present

## 2013-05-06 DIAGNOSIS — F329 Major depressive disorder, single episode, unspecified: Secondary | ICD-10-CM | POA: Insufficient documentation

## 2013-05-06 DIAGNOSIS — M109 Gout, unspecified: Secondary | ICD-10-CM | POA: Insufficient documentation

## 2013-05-06 DIAGNOSIS — E662 Morbid (severe) obesity with alveolar hypoventilation: Secondary | ICD-10-CM | POA: Diagnosis not present

## 2013-05-06 DIAGNOSIS — Z862 Personal history of diseases of the blood and blood-forming organs and certain disorders involving the immune mechanism: Secondary | ICD-10-CM | POA: Diagnosis not present

## 2013-05-06 DIAGNOSIS — Z8669 Personal history of other diseases of the nervous system and sense organs: Secondary | ICD-10-CM | POA: Insufficient documentation

## 2013-05-06 DIAGNOSIS — Z7982 Long term (current) use of aspirin: Secondary | ICD-10-CM | POA: Insufficient documentation

## 2013-05-06 DIAGNOSIS — D649 Anemia, unspecified: Secondary | ICD-10-CM | POA: Insufficient documentation

## 2013-05-06 DIAGNOSIS — Z872 Personal history of diseases of the skin and subcutaneous tissue: Secondary | ICD-10-CM | POA: Diagnosis not present

## 2013-05-06 DIAGNOSIS — R42 Dizziness and giddiness: Secondary | ICD-10-CM | POA: Insufficient documentation

## 2013-05-06 DIAGNOSIS — J441 Chronic obstructive pulmonary disease with (acute) exacerbation: Secondary | ICD-10-CM | POA: Insufficient documentation

## 2013-05-06 DIAGNOSIS — Z853 Personal history of malignant neoplasm of breast: Secondary | ICD-10-CM | POA: Insufficient documentation

## 2013-05-06 DIAGNOSIS — Z8739 Personal history of other diseases of the musculoskeletal system and connective tissue: Secondary | ICD-10-CM | POA: Diagnosis not present

## 2013-05-06 DIAGNOSIS — R0602 Shortness of breath: Secondary | ICD-10-CM | POA: Diagnosis not present

## 2013-05-06 DIAGNOSIS — I2510 Atherosclerotic heart disease of native coronary artery without angina pectoris: Secondary | ICD-10-CM | POA: Diagnosis not present

## 2013-05-06 DIAGNOSIS — R5381 Other malaise: Secondary | ICD-10-CM | POA: Insufficient documentation

## 2013-05-06 DIAGNOSIS — F3289 Other specified depressive episodes: Secondary | ICD-10-CM | POA: Insufficient documentation

## 2013-05-06 DIAGNOSIS — I10 Essential (primary) hypertension: Secondary | ICD-10-CM | POA: Diagnosis not present

## 2013-05-06 DIAGNOSIS — D259 Leiomyoma of uterus, unspecified: Secondary | ICD-10-CM | POA: Diagnosis not present

## 2013-05-06 LAB — PROTIME-INR
INR: 1.82 — ABNORMAL HIGH (ref 0.00–1.49)
Prothrombin Time: 20.5 seconds — ABNORMAL HIGH (ref 11.6–15.2)

## 2013-05-06 LAB — CBC WITH DIFFERENTIAL/PLATELET
Basophils Absolute: 0 10*3/uL (ref 0.0–0.1)
Basophils Relative: 0 % (ref 0–1)
Eosinophils Absolute: 0.2 10*3/uL (ref 0.0–0.7)
HCT: 33.1 % — ABNORMAL LOW (ref 36.0–46.0)
Hemoglobin: 11.1 g/dL — ABNORMAL LOW (ref 12.0–15.0)
MCH: 30.1 pg (ref 26.0–34.0)
MCHC: 33.5 g/dL (ref 30.0–36.0)
Monocytes Absolute: 0.5 10*3/uL (ref 0.1–1.0)
Monocytes Relative: 7 % (ref 3–12)
Neutro Abs: 4.1 10*3/uL (ref 1.7–7.7)
Neutrophils Relative %: 55 % (ref 43–77)
RDW: 14.7 % (ref 11.5–15.5)

## 2013-05-06 LAB — BASIC METABOLIC PANEL
Calcium: 9.2 mg/dL (ref 8.4–10.5)
GFR calc Af Amer: >90 mL/min (ref >90)
GFR calc non Af Amer: 87 mL/min — ABNORMAL LOW (ref >90)
Glucose, Bld: 121 mg/dL — ABNORMAL HIGH (ref 70–99)
Potassium: 3.2 mEq/L — ABNORMAL LOW (ref 3.5–5.1)
Sodium: 141 mEq/L (ref 135–145)

## 2013-05-06 LAB — POCT I-STAT TROPONIN I: Troponin i, poc: 0.02 ng/mL (ref 0.00–0.08)

## 2013-05-06 MED ORDER — SODIUM CHLORIDE 0.9 % IV SOLN: Freq: Once | INTRAVENOUS | Status: DC

## 2013-05-06 NOTE — ED Notes (Signed)
Pt reports vaginal bleeding since July 26. Pt reports feeling like she is going to pass out. Pt also reports shortness of breath with exertion x 1 week. Pt on home O2  At 2L. Pt presents with diaphoresis, reports onset past couple of days. Pt is on blood thinners. Pt had cardioversion July 27th.

## 2013-05-06 NOTE — ED Provider Notes (Addendum)
Medical screening examination/treatment/procedure(s) were performed by non-physician practitioner and as supervising physician I was immediately available for consultation/collaboration.  EKG: Sinus tachycardia no acute ischemic changes or ectopy. Indication is in   Lolita Patella, MD 05/06/13 [id number]  Lolita Patella, MD 05/06/13 2137

## 2013-05-06 NOTE — ED Provider Notes (Signed)
CSN: [id number]     Arrival date & time 05/06/13  1612 History     First MD Initiated Contact with Patient 05/06/13 1647     Chief Complaint  Patient presents with  . Vaginal Bleeding  . Shortness of Breath   (Consider location/radiation/quality/duration/timing/severity/associated sxs/prior Treatment) HPI Comments: Patient is a 67 year old female with a history of atrial fibrillation, coronary artery disease, and COPD, currently on 2 L O2 via nasal cannula at home, who presents for vaginal bleeding x 1 month. Patient states that vaginal bleeding began shortly after starting warfarin following a cardioversion for atrial fibrillation on 03/15/2013. Patient states that she has been going through approximately 2 pads per hour. She denies any modifying factors of her vaginal bleeding. Patient does endorse associated lower abdominal cramping at onset, but states this has since resolved. She further admits to associated dyspnea on exertion and profound lightheadedness and near syncope in the last week. She denies associated fever, CP, N/V, melena, hematochezia, urinary symptoms, numbness/tingling, extremity weakness, and pallor. Patient with hx of breast CA s/p lumpectomy, heavy vaginal bleeding, and ovarian cysts. Denies hx of endometriosis.   PCP - Dr. [name]; Cardiologist - Dr. Stanford Breed.  The history is provided by the patient. No language interpreter was used.    Past Medical History  Diagnosis Date  . Hypertension   . Hyperlipidemia   . Anemia   . CAD (coronary artery disease)     a. Nonobstructive CAD 2007 (EF 75%.  RCA  proximal 75% tubular, 25% prox LAD, 25% d1, 50% D2) at [facility]. b. NSTEMI 01/2010 in setting of respiratory failure, medical approach (not a good candidate for noninvasive eval)  . OSA on CPAP   . Hypothyroidism   . Peripheral edema 06/13/2011  . Asthma 06/13/2011  . Depression 06/13/2011  . Neuropathy 06/13/2011  . Cervical radiculopathy 06/13/2011  . Gout 06/13/2011  .  Obesity hypoventilation syndrome 06/13/2011  . COPD (chronic obstructive pulmonary disease)     on O2  . Breast cancer dx'd 2005    left  . Cellulitis     hx of cellulitis in left leg  . Impaired glucose tolerance 11/01/2012  . Dysrhythmia     atrial fibrilation  . PFO (patent foramen ovale)   . A-fib    Past Surgical History  Procedure Laterality Date  . Ovarian cyst removal    . Breast lumpectomy    . Cervical disc surgery      have cadaver bones,, screws, and plates   . Cervical biopsy  June 2013    at [facility] for Heavy  Bleeding  . Dilation and curettage of uterus    . Colonoscopy    . Laparoscopic appendectomy  03/29/2012    Procedure: APPENDECTOMY LAPAROSCOPIC;  Surgeon: Adin Hector, MD;  Location: [facility];  Service: General;  Laterality: N/A;  . Tee without cardioversion N/A 03/15/2013    Procedure: TRANSESOPHAGEAL ECHOCARDIOGRAM (TEE);  Surgeon: Thayer Headings, MD;  Location: [place];  Service: Cardiovascular;  Laterality: N/A;  . Cardioversion N/A 03/15/2013    Procedure: CARDIOVERSION;  Surgeon: Thayer Headings, MD;  Location: [facility] ENDOSCOPY;  Service: Cardiovascular;  Laterality: N/A;   Family History  Problem Relation Age of Onset  . Breast cancer    . Uterine cancer Mother   . Cancer Mother     Theadora Rama  . Heart disease Mother   . Cancer Sister 27    breast  . Diabetes Brother   . Cancer Maternal Aunt  breast  . Cancer Cousin 40    breast   History  Substance Use Topics  . Smoking status: Former Smoker -- 1.00 packs/day for 20 years    Types: Cigarettes    Quit date: 09/20/1998  . Smokeless tobacco: Never Used     Comment: 1ppd x 20 years  . Alcohol Use: Yes     Comment: rare   OB History   Grav Para Term Preterm Abortions TAB SAB Ect Mult Living                 Review of Systems  Constitutional: Positive for fatigue. Negative for fever.  Respiratory: Negative for cough.        +dyspnea on exertion  Cardiovascular: Negative for chest pain.   Gastrointestinal: Positive for abdominal pain (lower abdominal cramping; resolved). Negative for nausea, vomiting and blood in stool.  Genitourinary: Positive for vaginal bleeding. Negative for dysuria and pelvic pain.  Skin: Negative for color change and pallor.  Neurological: Positive for light-headedness. Negative for dizziness, syncope, weakness and numbness.  All other systems reviewed and are negative.   Allergies  Codeine and Lisinopril  Home Medications   Current Outpatient Rx  Name  Route  Sig  Dispense  Refill  . albuterol (PROVENTIL HFA;VENTOLIN HFA) 108 (90 BASE) MCG/ACT inhaler   Inhalation   Inhale 2 puffs into the lungs every 6 (six) hours as needed for wheezing.         [name] allopurinol (ZYLOPRIM) 300 MG tablet   Oral   Take 300 mg by mouth daily.         [name] amitriptyline (ELAVIL) 100 MG tablet      TAKE 1 TABLET AT BEDTIME   90 tablet   1   . amLODipine (NORVASC) 2.5 MG tablet   Oral   Take 2.5 mg by mouth as needed (hbp).         [name] aspirin EC 81 MG tablet   Oral   Take 81 mg by mouth every morning.         . Cholecalciferol (VITAMIN D) 2000 UNITS tablet   Oral   Take 2,000 Units by mouth at bedtime.         . DULoxetine (CYMBALTA) 30 MG capsule   Oral   Take 60 mg by mouth daily.         . ferrous sulfate 325 (65 FE) MG tablet   Oral   Take 325 mg by mouth daily with breakfast.         . furosemide (LASIX) 40 MG tablet   Oral   Take 40 mg by mouth See admin instructions. Take 1 tablet every morning then 1 tablet every other day at night         . levothyroxine (SYNTHROID, LEVOTHROID) 200 MCG tablet   Oral   Take 200 mcg by mouth daily.          . Omega-3 Fatty Acids (FISH OIL PO)   Oral   Take 1 capsule by mouth at bedtime.         . potassium chloride SA (K-DUR,KLOR-CON) 20 MEQ tablet   Oral   Take 20 mEq by mouth daily.         . vitamin C (ASCORBIC ACID) 500 MG tablet   Oral   Take 500 mg by mouth at bedtime.          [name] warfarin (COUMADIN) 5 MG tablet   Oral   Take 5 mg  by mouth every morning. 5 mg daily except 7.5 mg on Tues, Fri          BP 133/78  Pulse 104  Temp(Src) 99.3 F (37.4 C) (Oral)  Resp 20  Ht 5' 5.5" (1.664 m)  Wt 334 lb (151.501 kg)  BMI 54.72 kg/m2  SpO2 97%  Physical Exam  Nursing note and vitals reviewed. Constitutional: She is oriented to person, place, and time. No distress.  Morbidly obese female who is well and nontoxic appearing, in no acute distress.  HENT:  Head: Normocephalic and atraumatic.  Mouth/Throat: Oropharynx is clear and moist. No oropharyngeal exudate.  Eyes: Conjunctivae and EOM are normal. Pupils are equal, round, and reactive to light. No scleral icterus.  Neck: Normal range of motion.  Cardiovascular: Normal rate, regular rhythm, normal heart sounds and intact distal pulses.   Heart rate 98  Pulmonary/Chest: Effort normal. No respiratory distress. She has no wheezes. She has no rales.  Diffusely decreased breath sounds  Abdominal: Soft. There is no tenderness. There is no rebound and no guarding.  Soft, morbidly obese abdomen without tenderness to palpation or peritoneal signs  Musculoskeletal: Normal range of motion.  Neurological: She is alert and oriented to person, place, and time.  Skin: Skin is warm and dry. No rash noted. She is not diaphoretic. No erythema. No pallor.  Psychiatric: She has a normal mood and affect. Her behavior is normal.    ED Course   Procedures (including critical care time)  Labs Reviewed  BASIC METABOLIC PANEL - Abnormal; Notable for the following:    Potassium 3.2 (*)    Glucose, Bld 121 (*)    BUN 5 (*)    GFR calc non Af Amer 87 (*)    All other components within normal limits  PROTIME-INR - Abnormal; Notable for the following:    Prothrombin Time 20.5 (*)    INR 1.82 (*)    All other components within normal limits  CBC WITH DIFFERENTIAL - Abnormal; Notable for the following:    RBC 3.69 (*)     Hemoglobin 11.1 (*)    HCT 33.1 (*)    All other components within normal limits  PRO B NATRIURETIC PEPTIDE  POCT I-STAT TROPONIN I  POCT I-STAT TROPONIN I    Date: 05/06/2013  Rate: 108  Rhythm: sinus tachycardia  QRS Axis: normal  Intervals: normal  ST/T Wave abnormalities: nonspecific ST/T changes  Conduction Disutrbances:none  Narrative Interpretation: Sinus tachycardia with NSST changes; no STEMI or ischemic changes  Old EKG Reviewed: unchanged from 03/2012 I have personally reviewed and interpreted this EKG  Dg Chest 2 View  05/06/2013   *RADIOLOGY REPORT*  Clinical Data: Short of breath.  Near syncope.  CHEST - 2 VIEW  Comparison: 03/14/2013.  Findings: Flattening of the hemidiaphragms is present compatible with emphysema.  Lower cervical ACDF.  The frontal view is apical lordotic projection.  The cardiopericardial silhouette is mildly enlarged.  Basilar atelectasis is present.  No airspace disease or effusion.  IMPRESSION: Emphysema and cardiomegaly without acute cardiopulmonary disease.   Original Report Authenticated By: Dereck Ligas, M.D.   Clinical Data: Post menstrual bleeding. TRANSABDOMINAL AND TRANSVAGINAL ULTRASOUND OF PELVIS  Technique: Both transabdominal and transvaginal ultrasound examinations of the pelvis were performed including evaluation of the uterus, ovaries, adnexal regions, and pelvic cul-de-sac.  Comparison: CT 03/28/2012. Pelvic ultrasound 03/14/2011  Findings: Uterus: Mildly enlarged, 10.5 x 4.3 x 7.8 cm. Left anterior fundal fibroid measures 6.8 x 4.8 x 3.9 cm.  Endometrium: Mildly increased in thickness for post menopausal state, 10 mm, homogeneous.  Right Ovary: Not visualized.  Left Ovary: Prior oophorectomy.  Other Findings: No free fluid or adnexal mass.  IMPRESSION: Mildly enlarged uterus with 6.8 cm left fundal fibroid.  Mildly thickened endometrium. This could be further evaluated with hysterosonogram or biopsy.  1.  Postmenopausal vaginal bleeding    MDM  Vaginal bleeding in postmenopausal female; onset after starting warfarin after cardioversion for atrial fibrillation. CBC ordered, though symptoms appear c/w symptomatic anemia. Pelvic U/S ordered to evaluate for endometrial CA. Patient with +hx of breast CA. Hemodynamically stable at this time.  Patient's Hgb 11.1; down from 13.3 1 month ago, but c/w 1 year prior. PT/INR subtherapeutic, though patient does deny taking coumadin dose last night. Have consulted with Dr. Tollie Eth of cardiology who recommends the patient stop her daily coumadin dose in light of vaginal bleeding and f/u in office with Dr. Stanford Breed. Awaiting results of pelvic U/S. Given hemostability, anticipate d/c with OBGYN follow up as outpatient for endometrial biopsy and cardiology f/u with Dr. Stanford Breed.  Pelvic U/S with mildly enlarged uterus and 6.8 cm fundal fibroid as well as mildly thickened endometrium. Patient stable for discharge with followup as described above. Indications for ED return discussed and patient agreeable to plan.        Antonietta Breach, PA-C 05/06/13 2118

## 2013-05-08 NOTE — ED Provider Notes (Signed)
Medical screening examination/treatment/procedure(s) were performed by non-physician practitioner and as supervising physician I was immediately available for consultation/collaboration.   Lolita Patella, MD 05/08/13 [phone]

## 2013-05-11 ENCOUNTER — Encounter: Payer: Self-pay | Admitting: Obstetrics and Gynecology

## 2013-05-11 ENCOUNTER — Ambulatory Visit (INDEPENDENT_AMBULATORY_CARE_PROVIDER_SITE_OTHER): Payer: Medicare Other | Admitting: Obstetrics and Gynecology

## 2013-05-11 ENCOUNTER — Other Ambulatory Visit (HOSPITAL_COMMUNITY)
Admission: RE | Admit: 2013-05-11 | Discharge: 2013-05-11 | Disposition: A | Discharge status: Home / Self Care | Payer: Medicare Other | Source: Ambulatory Visit | Attending: Obstetrics and Gynecology | Admitting: Obstetrics and Gynecology

## 2013-05-11 VITALS — Temp 97.8°F | Ht 65.5 in | Wt 335.7 lb

## 2013-05-11 DIAGNOSIS — N950 Postmenopausal bleeding: Secondary | ICD-10-CM | POA: Insufficient documentation

## 2013-05-11 MED ORDER — MEGESTROL ACETATE 40 MG PO TABS: 40.0000 mg | ORAL_TABLET | Freq: Two times a day (BID) | ORAL | Status: DC

## 2013-05-11 NOTE — Progress Notes (Signed)
Subjective:    Patient ID: [name] [name], female    DOB: 1946-04-27, 67 y.o.   MRN: [id number]  HPI  67 yo with BMI 55 presenting today for evaluation of postmenopausal vaginal bleeding. Patient states the bleeding started approximately 2 years ago. She was started on provera which stopped the bleeding. The bleeding returned a year later while on Coumadin and stopped spontaneously. Patient was taken off coumadin a month ago following cardioversion and states the bleeding returned. She has been bleeding for the past month heavily, daily with passage of large clots. Patient denies chest pain, SOB, lightheadedness/dizziness. Patient states she has had a normal recent pap smear and is in need of a referral for breast mammography.  Past Medical History  Diagnosis Date  . Hypertension   . Hyperlipidemia   . Anemia   . CAD (coronary artery disease)     a. Nonobstructive CAD 2007 (EF 75%.  RCA  proximal 75% tubular, 25% prox LAD, 25% d1, 50% D2) at [facility]. b. NSTEMI 01/2010 in setting of respiratory failure, medical approach (not a good candidate for noninvasive eval)  . OSA on CPAP   . Hypothyroidism   . Peripheral edema 06/13/2011  . Asthma 06/13/2011  . Depression 06/13/2011  . Neuropathy 06/13/2011  . Cervical radiculopathy 06/13/2011  . Gout 06/13/2011  . Obesity hypoventilation syndrome 06/13/2011  . COPD (chronic obstructive pulmonary disease)     on O2  . Breast cancer dx'd 2005    left  . Cellulitis     hx of cellulitis in left leg  . Impaired glucose tolerance 11/01/2012  . Dysrhythmia     atrial fibrilation  . PFO (patent foramen ovale)   . A-fib    Past Surgical History  Procedure Laterality Date  . Ovarian cyst removal    . Breast lumpectomy    . Cervical disc surgery      have cadaver bones,, screws, and plates   . Cervical biopsy  June 2013    at [facility] for Heavy  Bleeding  . Dilation and curettage of uterus    . Colonoscopy    . Laparoscopic appendectomy  03/29/2012   Procedure: APPENDECTOMY LAPAROSCOPIC;  Surgeon: Adin Hector, MD;  Location: [facility];  Service: General;  Laterality: N/A;  . Tee without cardioversion N/A 03/15/2013    Procedure: TRANSESOPHAGEAL ECHOCARDIOGRAM (TEE);  Surgeon: Thayer Headings, MD;  Location: [place];  Service: Cardiovascular;  Laterality: N/A;  . Cardioversion N/A 03/15/2013    Procedure: CARDIOVERSION;  Surgeon: Thayer Headings, MD;  Location: [facility] ENDOSCOPY;  Service: Cardiovascular;  Laterality: N/A;   Family History  Problem Relation Age of Onset  . Breast cancer    . Uterine cancer Mother   . Cancer Mother     Theadora Rama  . Heart disease Mother   . Cancer Sister 30    breast  . Diabetes Brother   . Cancer Maternal Aunt     breast  . Cancer Cousin 40    breast   History  Substance Use Topics  . Smoking status: Former Smoker -- 1.00 packs/day for 20 years    Types: Cigarettes    Quit date: 09/20/1998  . Smokeless tobacco: Never Used     Comment: 1ppd x 20 years  . Alcohol Use: Yes     Comment: rare     Review of Systems     Objective:   Physical Exam  GENERAL: Well-developed, well-nourished female in no acute distress. Requires oxygen  ABDOMEN:  Soft, nontender, obese PELVIC: Normal external female genitalia. Vagina is pink and rugated.  Normal discharge. Normal appearing cervix. Small amount of dark blood in vault. Bimanual exam limited secondary to body habitus EXTREMITIES: No cyanosis, clubbing, or edema, 2+ distal pulses.  04/2013 ultrasound Findings:  Uterus: Mildly enlarged, 10.5 x 4.3 x 7.8 cm. Left anterior fundal  fibroid measures 6.8 x 4.8 x 3.9 cm.  Endometrium: Mildly increased in thickness for post menopausal  state, 10 mm, homogeneous.  Right Ovary: Not visualized.  Left Ovary: Prior oophorectomy.  Other Findings: No free fluid or adnexal mass.  IMPRESSION:  Mildly enlarged uterus with 6.8 cm left fundal fibroid.  Mildly thickened endometrium. This could be further evaluated  with  hysterosonogram or biopsy.     Assessment & Plan:  67 yo with postmenopausal vaginal bleeding - Discussed need for endometrial biopsy ENDOMETRIAL BIOPSY     The indications for endometrial biopsy were reviewed.   Risks of the biopsy including cramping, bleeding, infection, uterine perforation, inadequate specimen and need for additional procedures  were discussed. The patient states she understands and agrees to undergo procedure today. Consent was signed. Time out was performed. Urine HCG was negative. A sterile speculum was placed in the patient's vagina and the cervix was prepped with Betadine. A single-toothed tenaculum was placed on the anterior lip of the cervix to stabilize it. The uterine cavity was sounded to a depth of 10 cm using the uterine sound. The 3 mm pipelle was introduced into the endometrial cavity without difficulty, 2 passes were made.  A  moderate amount of tissue was  sent to pathology. The instruments were removed from the patient's vagina. Minimal bleeding from the cervix was noted. The patient tolerated the procedure well.  Routine post-procedure instructions were given to the patient. The patient will follow up in two weeks to review the results and for further management.   -Rx Megace provided - referral for mammogram provided - RTC in 2 weeks to discuss results

## 2013-05-14 ENCOUNTER — Encounter: Payer: Self-pay | Admitting: *Deleted

## 2013-05-18 ENCOUNTER — Ambulatory Visit (HOSPITAL_COMMUNITY): Payer: Medicare Other

## 2013-06-01 ENCOUNTER — Ambulatory Visit (INDEPENDENT_AMBULATORY_CARE_PROVIDER_SITE_OTHER): Payer: Medicare Other | Admitting: Obstetrics and Gynecology

## 2013-06-01 ENCOUNTER — Ambulatory Visit (HOSPITAL_COMMUNITY)
Admission: RE | Admit: 2013-06-01 | Discharge: 2013-06-01 | Disposition: A | Discharge status: Home / Self Care | Payer: Medicare Other | Source: Ambulatory Visit | Attending: Obstetrics and Gynecology | Admitting: Obstetrics and Gynecology

## 2013-06-01 ENCOUNTER — Encounter: Payer: Self-pay | Admitting: Obstetrics and Gynecology

## 2013-06-01 VITALS — BP 118/72 | HR 100 | Temp 97.8°F | Ht 65.0 in | Wt 335.2 lb

## 2013-06-01 DIAGNOSIS — N950 Postmenopausal bleeding: Secondary | ICD-10-CM

## 2013-06-01 DIAGNOSIS — Z1231 Encounter for screening mammogram for malignant neoplasm of breast: Secondary | ICD-10-CM | POA: Insufficient documentation

## 2013-06-01 DIAGNOSIS — Z7189 Other specified counseling: Secondary | ICD-10-CM | POA: Diagnosis not present

## 2013-06-01 DIAGNOSIS — Z712 Person consulting for explanation of examination or test findings: Secondary | ICD-10-CM

## 2013-06-01 NOTE — Progress Notes (Signed)
Patient ID: [name] [name], female   DOB: Dec 28, 1945, 68 y.o.   MRN: [id number] 66 yo presenting today to discuss results of endometrial biopsy. Benign results reviewed and explained. Reassurance provided. Bleeding likely secondary to medication effect as patient was on coumadin for several months. Advised to keep a menstrual calendar and return if bleeding returns.

## 2013-06-07 ENCOUNTER — Ambulatory Visit (INDEPENDENT_AMBULATORY_CARE_PROVIDER_SITE_OTHER): Payer: Medicare Other | Admitting: Cardiology

## 2013-06-07 ENCOUNTER — Telehealth: Payer: Self-pay | Admitting: *Deleted

## 2013-06-07 ENCOUNTER — Encounter: Payer: Self-pay | Admitting: Cardiology

## 2013-06-07 VITALS — BP 118/74 | HR 94 | Ht 65.0 in | Wt 339.0 lb

## 2013-06-07 DIAGNOSIS — I509 Heart failure, unspecified: Secondary | ICD-10-CM

## 2013-06-07 DIAGNOSIS — I5031 Acute diastolic (congestive) heart failure: Secondary | ICD-10-CM | POA: Diagnosis not present

## 2013-06-07 DIAGNOSIS — I10 Essential (primary) hypertension: Secondary | ICD-10-CM

## 2013-06-07 DIAGNOSIS — I2510 Atherosclerotic heart disease of native coronary artery without angina pectoris: Secondary | ICD-10-CM | POA: Diagnosis not present

## 2013-06-07 DIAGNOSIS — I4892 Unspecified atrial flutter: Secondary | ICD-10-CM | POA: Diagnosis not present

## 2013-06-07 LAB — HEPATIC FUNCTION PANEL
AST: 50 U/L — ABNORMAL HIGH (ref 0–37)
Alkaline Phosphatase: 70 U/L (ref 39–117)
Total Bilirubin: 0.7 mg/dL (ref 0.3–1.2)

## 2013-06-07 LAB — BASIC METABOLIC PANEL
Calcium: 9 mg/dL (ref 8.4–10.5)
GFR: 76.41 mL/min (ref >60.00)
Potassium: 3.2 mEq/L — ABNORMAL LOW (ref 3.5–5.1)
Sodium: 143 mEq/L (ref 135–145)

## 2013-06-07 MED ORDER — APIXABAN 5 MG PO TABS: 5.0000 mg | ORAL_TABLET | Freq: Two times a day (BID) | ORAL | Status: DC

## 2013-06-07 NOTE — Progress Notes (Signed)
HPI: fu atrial flutter. Also with hx of CAD, HTN, HL, OSA, hypothyroidism, COPD on O2, L breast CA. LHC at [facility] 10/2005: pRCA 75%, LM < 25%, pLAD 25%, D1 25%, D2 50%. Carotid Dopplers June 2013 showed no significant stenosis. She suffered a NSTEMI in 01/2010 in the setting of SIRS from leg cellulitis. This was felt to be demand ischemia. Med Rx was pursued (felt to be a poor candidate for noninvasive imaging). She was evaluated for near syncope and SSS with pauses by EP in 05/2012. Beta blockers and Provera were stopped. Event monitor demonstrated no significant arrhythmias. Echo 02/2012: EF 55-60%, mild AI, MAC, mod LAE, PASP mildly increased. Patient had atrial flutter with RVR 6/14. She was felt to be a poor candidate for atrial flutter ablation. TEE guided cardioversion was performed. She had restoration of NSR. TEE 03/15/13: EF 55-60%, no LA clot, positive PFO, mild to moderate TR. Patient last seen by Richardson Dopp 7/14. Since then, she has some dyspnea on exertion which is chronic. No orthopnea or PND. No chest pain, palpitations or syncope. Coumadin previously discontinued because of vaginal bleeding. She had a biopsy of her endometrium that was negative by her report. Her bleeding has resolved.   Current Outpatient Prescriptions  Medication Sig Dispense Refill  . albuterol (PROVENTIL HFA;VENTOLIN HFA) 108 (90 BASE) MCG/ACT inhaler Inhale 2 puffs into the lungs every 6 (six) hours as needed for wheezing.      [name] allopurinol (ZYLOPRIM) 300 MG tablet Take 300 mg by mouth daily.      [name] amitriptyline (ELAVIL) 100 MG tablet TAKE 1 TABLET AT BEDTIME  90 tablet  1  . amLODipine (NORVASC) 2.5 MG tablet Take 2.5 mg by mouth as needed (hbp).      [name] aspirin EC 81 MG tablet Take 81 mg by mouth every morning.      . Cholecalciferol (VITAMIN D) 2000 UNITS tablet Take 2,000 Units by mouth at bedtime.      . DULoxetine (CYMBALTA) 30 MG capsule Take 60 mg by mouth daily.      . ferrous sulfate 325 (65 FE) MG tablet  Take 325 mg by mouth daily with breakfast.      . furosemide (LASIX) 40 MG tablet Take 40 mg by mouth See admin instructions. Take 1 tablet every morning then 1 tablet every other day at night      . levothyroxine (SYNTHROID, LEVOTHROID) 200 MCG tablet Take 200 mcg by mouth daily.       . Omega-3 Fatty Acids (FISH OIL PO) Take 1 capsule by mouth at bedtime.      . potassium chloride SA (K-DUR,KLOR-CON) 20 MEQ tablet Take 20 mEq by mouth daily.      . vitamin C (ASCORBIC ACID) 500 MG tablet Take 500 mg by mouth at bedtime.       No current facility-administered medications for this visit.     Past Medical History  Diagnosis Date  . Hypertension   . Hyperlipidemia   . Anemia   . CAD (coronary artery disease)     a. Nonobstructive CAD 2007 (EF 75%.  RCA  proximal 75% tubular, 25% prox LAD, 25% d1, 50% D2) at [facility]. b. NSTEMI 01/2010 in setting of respiratory failure, medical approach (not a good candidate for noninvasive eval)  . OSA on CPAP   . Hypothyroidism   . Peripheral edema 06/13/2011  . Asthma 06/13/2011  . Depression 06/13/2011  . Neuropathy 06/13/2011  . Cervical radiculopathy 06/13/2011  . Gout  06/13/2011  . Obesity hypoventilation syndrome 06/13/2011  . COPD (chronic obstructive pulmonary disease)     on O2  . Breast cancer dx'd 2005    left  . Cellulitis     hx of cellulitis in left leg  . Impaired glucose tolerance 11/01/2012  . PFO (patent foramen ovale)   . Atrial flutter     Past Surgical History  Procedure Laterality Date  . Ovarian cyst removal    . Breast lumpectomy    . Cervical disc surgery      have cadaver bones,, screws, and plates   . Cervical biopsy  June 2013    at [facility] for Heavy  Bleeding  . Dilation and curettage of uterus    . Colonoscopy    . Laparoscopic appendectomy  03/29/2012    Procedure: APPENDECTOMY LAPAROSCOPIC;  Surgeon: Adin Hector, MD;  Location: [facility];  Service: General;  Laterality: N/A;  . Tee without cardioversion N/A 03/15/2013      Procedure: TRANSESOPHAGEAL ECHOCARDIOGRAM (TEE);  Surgeon: Thayer Headings, MD;  Location: [place];  Service: Cardiovascular;  Laterality: N/A;  . Cardioversion N/A 03/15/2013    Procedure: CARDIOVERSION;  Surgeon: Thayer Headings, MD;  Location: [facility] ENDOSCOPY;  Service: Cardiovascular;  Laterality: N/A;    History   Social History  . Marital Status: Divorced    Spouse Name: N/A    Number of Children: N/A  . Years of Education: N/A   Occupational History  . Not on file.   Social History Main Topics  . Smoking status: Former Smoker -- 1.00 packs/day for 20 years    Types: Cigarettes    Quit date: 09/20/1998  . Smokeless tobacco: Never Used     Comment: 1ppd x 20 years  . Alcohol Use: Yes     Comment: rare  . Drug Use: No  . Sexual Activity: Not Currently    Birth Control/ Protection: Post-menopausal   Other Topics Concern  . Not on file   Social History Narrative  . No narrative on file    ROS: no fevers or chills, productive cough, hemoptysis, dysphasia, odynophagia, melena, hematochezia, dysuria, hematuria, rash, seizure activity, orthopnea, PND, claudication. Remaining systems are negative.  Physical Exam: Well-developed obese in no acute distress.  Skin is warm and dry.  HEENT is normal.  Neck is supple.  Chest is clear to auscultation with normal expansion.  Cardiovascular exam is regular rate and rhythm.  Abdominal exam nontender or distended. No masses palpated. Extremities show 1+ edema. neuro grossly intact  ECG sinus rhythm at a rate of 91. Nonspecific ST changes.

## 2013-06-07 NOTE — Patient Instructions (Signed)
Your physician recommends that you schedule a follow-up appointment in: 3 MONTHS WITH DR CRENSHAW  STOP ASPIRIN  START ELIQUIS 5 MG ONE TABLET TWICE DAILY  Your physician recommends that you HAVE LAB WORK TODAY

## 2013-06-07 NOTE — Assessment & Plan Note (Signed)
Blood pressure controlled. Continue present medications. Check potassium and renal function. 

## 2013-06-07 NOTE — Assessment & Plan Note (Signed)
Patient remains in sinus rhythm. She had bradycardia previously with AV nodal blocking agents. She has multiple embolic risk factors. Coumadin recently discontinued because of vaginal bleeding. This has resolved and endometrial biopsy negative. We will try apixaban 5 mg by mouth twice a day. We will discontinue this medication if she has recurrent vaginal bleeding. Discontinue aspirin.

## 2013-06-07 NOTE — Telephone Encounter (Signed)
[employer] approved eliquis from 05/08/2013 to 06/07/2014, they are going to notify patient by phone and mail, I will also call patient

## 2013-06-07 NOTE — Assessment & Plan Note (Signed)
Discontinue aspirin given need for anticoagulation. Lipitor previously discontinued because of increased LFTs. Repeat LFTs today. If normalized we'll try lower dose statin.

## 2013-06-07 NOTE — Assessment & Plan Note (Signed)
euvolemic on examination. Continue present dose of diuretics.

## 2013-06-08 ENCOUNTER — Other Ambulatory Visit: Payer: Self-pay | Admitting: *Deleted

## 2013-06-08 DIAGNOSIS — E876 Hypokalemia: Secondary | ICD-10-CM

## 2013-06-08 MED ORDER — POTASSIUM CHLORIDE CRYS ER 20 MEQ PO TBCR: 20.0000 meq | EXTENDED_RELEASE_TABLET | Freq: Two times a day (BID) | ORAL | Status: DC

## 2013-06-11 ENCOUNTER — Telehealth: Payer: Self-pay | Admitting: Internal Medicine

## 2013-06-11 NOTE — Telephone Encounter (Signed)
Message copied by Biagio Borg on Mon Jun 11, 2013  8:18 PM ------      Message from: MATHIS, DEBRA W      Created: Fri Jun 08, 2013  6:25 PM       pt aware of results, forwarded to dr Jenny Reichmann ------

## 2013-06-11 NOTE — Telephone Encounter (Signed)
Pt already eval feb 2014 with lab eval, CT neg for acute July 2013  Robin to let pt know, ok for refer to Gi for persist mild LFT's

## 2013-06-12 NOTE — Telephone Encounter (Signed)
Patient already informed

## 2013-06-15 ENCOUNTER — Other Ambulatory Visit (INDEPENDENT_AMBULATORY_CARE_PROVIDER_SITE_OTHER): Payer: Medicare Other

## 2013-06-15 ENCOUNTER — Other Ambulatory Visit: Payer: Self-pay | Admitting: *Deleted

## 2013-06-15 DIAGNOSIS — E876 Hypokalemia: Secondary | ICD-10-CM

## 2013-06-15 LAB — BASIC METABOLIC PANEL
CO2: 28 mEq/L (ref 19–32)
Calcium: 9.1 mg/dL (ref 8.4–10.5)
Chloride: 103 mEq/L (ref 96–112)
Creatinine, Ser: 1 mg/dL (ref 0.4–1.2)
Glucose, Bld: 100 mg/dL — ABNORMAL HIGH (ref 70–99)

## 2013-06-15 MED ORDER — POTASSIUM CHLORIDE CRYS ER 20 MEQ PO TBCR: 20.0000 meq | EXTENDED_RELEASE_TABLET | Freq: Three times a day (TID) | ORAL | Status: DC

## 2013-06-19 ENCOUNTER — Encounter: Payer: Self-pay | Admitting: Obstetrics & Gynecology

## 2013-06-27 ENCOUNTER — Other Ambulatory Visit: Payer: Self-pay | Admitting: Internal Medicine

## 2013-06-27 DIAGNOSIS — C50919 Malignant neoplasm of unspecified site of unspecified female breast: Secondary | ICD-10-CM

## 2013-06-28 ENCOUNTER — Encounter: Payer: Self-pay | Admitting: Internal Medicine

## 2013-06-28 ENCOUNTER — Telehealth: Payer: Self-pay | Admitting: Internal Medicine

## 2013-06-28 ENCOUNTER — Ambulatory Visit (HOSPITAL_BASED_OUTPATIENT_CLINIC_OR_DEPARTMENT_OTHER): Payer: Medicare Other | Admitting: Internal Medicine

## 2013-06-28 ENCOUNTER — Other Ambulatory Visit (HOSPITAL_BASED_OUTPATIENT_CLINIC_OR_DEPARTMENT_OTHER): Payer: Medicare Other | Admitting: Lab

## 2013-06-28 VITALS — BP 141/76 | HR 108 | Temp 97.3°F | Resp 17 | Ht 65.0 in | Wt 340.7 lb

## 2013-06-28 DIAGNOSIS — N950 Postmenopausal bleeding: Secondary | ICD-10-CM | POA: Diagnosis not present

## 2013-06-28 DIAGNOSIS — R945 Abnormal results of liver function studies: Secondary | ICD-10-CM

## 2013-06-28 DIAGNOSIS — Z853 Personal history of malignant neoplasm of breast: Secondary | ICD-10-CM | POA: Diagnosis not present

## 2013-06-28 DIAGNOSIS — C50919 Malignant neoplasm of unspecified site of unspecified female breast: Secondary | ICD-10-CM

## 2013-06-28 DIAGNOSIS — I4891 Unspecified atrial fibrillation: Secondary | ICD-10-CM | POA: Diagnosis not present

## 2013-06-28 DIAGNOSIS — E662 Morbid (severe) obesity with alveolar hypoventilation: Secondary | ICD-10-CM

## 2013-06-28 DIAGNOSIS — Z9981 Dependence on supplemental oxygen: Secondary | ICD-10-CM | POA: Diagnosis not present

## 2013-06-28 LAB — CBC WITH DIFFERENTIAL/PLATELET
Basophils Absolute: 0.1 10*3/uL (ref 0.0–0.1)
EOS%: 4.6 % (ref 0.0–7.0)
Eosinophils Absolute: 0.3 10*3/uL (ref 0.0–0.5)
HGB: 12.6 g/dL (ref 11.6–15.9)
LYMPH%: 34.9 % (ref 14.0–49.7)
MCH: 29.6 pg (ref 25.1–34.0)
MCV: 91 fL (ref 79.5–101.0)
MONO%: 6.7 % (ref 0.0–14.0)
Platelets: 188 10*3/uL (ref 145–400)
RBC: 4.25 10*6/uL (ref 3.70–5.45)
RDW: 14.8 % — ABNORMAL HIGH (ref 11.2–14.5)

## 2013-06-28 LAB — COMPREHENSIVE METABOLIC PANEL (CC13)
Alkaline Phosphatase: 96 U/L (ref 40–150)
Anion Gap: 11 mEq/L (ref 3–11)
CO2: 25 mEq/L (ref 22–29)
Creatinine: 1 mg/dL (ref 0.6–1.1)
Glucose: 155 mg/dl — ABNORMAL HIGH (ref 70–140)
Sodium: 143 mEq/L (ref 136–145)
Total Bilirubin: 0.34 mg/dL (ref 0.20–1.20)
Total Protein: 8 g/dL (ref 6.4–8.3)

## 2013-06-28 NOTE — Patient Instructions (Signed)
Apixaban oral tablets What is this medicine? APIXABAN is an anticoagulant (blood thinner). It is used to lower the chance of stroke in people with a medical condition called atrial fibrillation. This medicine may be used for other purposes; ask your health care provider or pharmacist if you have questions. What should I tell my health care provider before I take this medicine? They need to know if you have any of these conditions: -bleeding disorders -bleeding in the brain -blood in your stools (black or tarry stools) or if you have blood in your vomit -history of stomach bleeding -kidney disease -liver disease -mechanical heart valve -an unusual or allergic reaction to apixaban, other medicines, foods, dyes, or preservatives -pregnant or trying to get pregnant -breast-feeding How should I use this medicine? Take this medicine by mouth with a glass of water. Follow the directions on the prescription label. You can take it with or without food. If it upsets your stomach, take it with food. Take your medicine at regular intervals. Do not take it more often than directed. Do not stop taking except on your doctor's advice. Talk to your pediatrician regarding the use of this medicine in children. Special care may be needed. Overdosage: If you think you've taken too much of this medicine contact a poison control center or emergency room at once. Overdosage: If you think you have taken too much of this medicine contact a poison control center or emergency room at once. NOTE: This medicine is only for you. Do not share this medicine with others. What if I miss a dose? If you miss a dose, take it as soon as you can. If it is almost time for your next dose, take only that dose. Do not take double or extra doses. What may interact with this medicine? This medicine may interact with the following: -aspirin and aspirin-like medicines -certain medicines for fungal infections like ketoconazole and  itraconazole -certain medicines for seizures like carbamazepine and phenytoin -certain medicines that treat or prevent blood clots like warfarin, enoxaparin, and dalteparin -clarithromycin -NSAIDs, medicines for pain and inflammation, like ibuprofen or naproxen -rifampin -ritonavir -St. John's wort This list may not describe all possible interactions. Give your health care provider a list of all the medicines, herbs, non-prescription drugs, or dietary supplements you use. Also tell them if you smoke, drink alcohol, or use illegal drugs. Some items may interact with your medicine. What should I watch for while using this medicine? Do not stop taking this medicine without first talking to your doctor. Stopping this medicine may increase your risk of having a stroke. Be sure to refill your prescription before you run out of medicine. This medicine may increase your risk to bruise or bleed. Call your doctor or health care professional if you notice any unusual bleeding. Be careful brushing and flossing your teeth or using a toothpick because you may bleed more easily. If you have any dental work done, tell your dentist you are receiving this medicine. What side effects may I notice from receiving this medicine? Side effects that you should report to your doctor or health care professional as soon as possible: -allergic reactions like skin rash, itching or hives, swelling of the face, lips, or tongue -bloody or black, tarry stools -changes in vision -confusion, trouble speaking or understanding -red or dark-brown urine -red spots on the skin -severe headaches -spitting up blood or brown material that looks like coffee grounds -sudden numbness or weakness of the face, arm or leg -trouble walking,  dizziness, loss of balance or coordination -unusual bruising or bleeding from the eye, gums, or nose This list may not describe all possible side effects. Call your doctor for medical advice about side  effects. You may report side effects to FDA at 1-800-FDA-1088. Where should I keep my medicine? Keep out of the reach of children. Store at room temperature between 20 and 25 degrees C (68 and 77 degrees F). Throw away any unused medicine after the expiration date. NOTE: This sheet is a summary. It may not cover all possible information. If you have questions about this medicine, talk to your doctor, pharmacist, or health care provider.  2013, [employer]. (09/23/2011 2:48:26 PM)

## 2013-06-28 NOTE — Telephone Encounter (Signed)
gv and printed appt sched and avs for pt for April 2015

## 2013-06-29 ENCOUNTER — Telehealth: Payer: Self-pay | Admitting: Cardiology

## 2013-06-29 ENCOUNTER — Encounter: Payer: Self-pay | Admitting: Cardiology

## 2013-06-29 ENCOUNTER — Encounter: Payer: Self-pay | Admitting: Obstetrics and Gynecology

## 2013-06-29 NOTE — Progress Notes (Signed)
[name] OFFICE PROGRESS NOTE  [name], MD [place] [address]  DIAGNOSIS: Breast cancer, unspecified laterality - Plan: CBC with Differential, Comprehensive metabolic panel, DG Chest 2 View  Postmenopausal vaginal bleeding  OXYGEN-USE OF SUPPLEMENTAL  Morbid obesity with BMI of 50.0-59.9, adult  Obesity hypoventilation syndrome - Plan: CBC with Differential, Comprehensive metabolic panel, DG Chest 2 View  Abnormal liver function tests - Plan: CBC with Differential, Comprehensive metabolic panel, DG Chest 2 View  Chief Complaint  Patient presents with  . Breast cancer, unspecified laterality    CURRENT THERAPY: Surveillance  INTERVAL HISTORY: [name] [name] 67 y.o. female with history of infiltrating ductal carcinoma of the left breast (August 2006) who received all of her care for her breast cancer at [facility] is here for followup.  She was last seen on December 28, 2012 by [employer] [facility] Wertman.  Her breast cancer history is as not below.  Today, her main complaints have been the intermittent post-menopausal bleeding.  .  She reports having a biopsy which was negative for cancer.  She also reports having Afib since June that required cardiversion.  She was on coumadin but now is on apixaban 5 mg bid. Overall, she reports doing pretty good.  She reports continued neuropathy of her hands and feet since receipt of her chemotherapy.  She denies any fevers or chills.  She reports resolution of her prior left breast cellulitis.    MEDICAL HISTORY: Past Medical History  Diagnosis Date  . Hypertension   . Hyperlipidemia   . Anemia   . CAD (coronary artery disease)     a. Nonobstructive CAD 2007 (EF 75%.  RCA  proximal 75% tubular, 25% prox LAD, 25% d1, 50% D2) at [facility]. b. NSTEMI 01/2010 in setting of respiratory failure, medical approach (not a good candidate for noninvasive eval)  . OSA on CPAP   . Hypothyroidism   . Peripheral edema 06/13/2011  .  Asthma 06/13/2011  . Depression 06/13/2011  . Neuropathy 06/13/2011  . Cervical radiculopathy 06/13/2011  . Gout 06/13/2011  . Obesity hypoventilation syndrome 06/13/2011  . COPD (chronic obstructive pulmonary disease)     on O2  . Breast cancer dx'd 2005    left  . Cellulitis     hx of cellulitis in left leg  . Impaired glucose tolerance 11/01/2012  . PFO (patent foramen ovale)   . Atrial flutter     INTERIM HISTORY: has HYPOTHYROIDISM; HYPERLIPIDEMIA; HYPERTENSION; ACUT MI SUBENDOCARDIAL INFARCT SUBSQT EPIS CARE; CORONARY ARTERY DISEASE; C O P D; OBSTRUCTIVE SLEEP APNEA; OXYGEN-USE OF SUPPLEMENTAL; Preventative health care; Breast cancer; Peripheral edema; Asthma; Depression; Colon polyps; Neuropathy; Cervical radiculopathy; Gout; Obesity hypoventilation syndrome; Cellulitis, leg; Sick sinus syndrome; Morbid obesity with BMI of 50.0-59.9, adult; Acute appendicitis; Cough; Abnormal TSH; Right lumbar radiculopathy; Impaired glucose tolerance; Abnormal liver function tests; Atrial flutter; Tachycardia; Anticoagulant long-term use; Tachy-brady syndrome; Acute diastolic CHF (congestive heart failure); PFO (patent foramen ovale); Hypokalemia; Postmenopausal vaginal bleeding; and Postmenopausal vaginal bleeding on her problem list.    ALLERGIES:  is allergic to codeine and lisinopril.  MEDICATIONS: has a current medication list which includes the following prescription(s): albuterol, allopurinol, amitriptyline, amlodipine, apixaban, vitamin d, duloxetine, ferrous sulfate, furosemide, levothyroxine, omega-3 fatty acids, potassium chloride sa, and vitamin c.  SURGICAL HISTORY:  Past Surgical History  Procedure Laterality Date  . Ovarian cyst removal    . Breast lumpectomy    . Cervical disc surgery  have cadaver bones,, screws, and plates   . Cervical biopsy  June 2013    at [facility] for Heavy  Bleeding  . Dilation and curettage of uterus    . Colonoscopy    . Laparoscopic appendectomy  03/29/2012     Procedure: APPENDECTOMY LAPAROSCOPIC;  Surgeon: Adin Hector, MD;  Location: [facility];  Service: General;  Laterality: N/A;  . Tee without cardioversion N/A 03/15/2013    Procedure: TRANSESOPHAGEAL ECHOCARDIOGRAM (TEE);  Surgeon: Thayer Headings, MD;  Location: [facility];  Service: Cardiovascular;  Laterality: N/A;  . Cardioversion N/A 03/15/2013    Procedure: CARDIOVERSION;  Surgeon: Thayer Headings, MD;  Location: [facility];  Service: Cardiovascular;  Laterality: N/A;    REVIEW OF SYSTEMS:   Constitutional: Denies fevers, chills or abnormal weight loss Eyes: Denies blurriness of vision Ears, nose, mouth, throat, and face: Denies mucositis or sore throat Respiratory: Denies cough, dyspnea or wheezes Cardiovascular: Denies palpitation, chest discomfort or lower extremity swelling Gastrointestinal:  Denies nausea, heartburn or change in bowel habits Skin: Denies abnormal skin rashes Lymphatics: Denies new lymphadenopathy or easy bruising Neurological:She reports numbness, tingling of her hands and feets but denies new weaknesses Behavioral/Psych: Mood is stable, no new changes  All other systems were reviewed with the patient and are negative.  PHYSICAL EXAMINATION: ECOG PERFORMANCE STATUS: 1 - Symptomatic but completely ambulatory  Blood pressure 141/76, pulse 108, temperature 97.3 F (36.3 C), temperature source Oral, resp. rate 17, height 5' 5"  (1.651 m), weight 340 lb 11.2 oz (154.541 kg).  GENERAL:alert, no distress and comfortable; Morbidly obese. SKIN: skin color, texture, turgor are normal, no rashes or significant lesions EYES: normal, Conjunctiva are pink and non-injected, sclera clear OROPHARYNX:no exudate, no erythema and lips, buccal mucosa, and tongue normal  NECK: supple, thyroid normal size, non-tender, without nodularity LYMPH:  no palpable lymphadenopathy in the cervical, axillary or supraclavicular LUNGS: clear to auscultation and percussion with normal  breathing effort HEART: regular rate & rhythm and no murmurs and no chronic pitting extremity edema BREASTS: Large, right breast is benign, and pendulous.  Left breast shows the effects of radiation with skin changes but no suspicious finding suggestive of recurrence.  No axillary adenopathy.  ABDOMEN:abdomen soft, non-tender and normal bowel sounds Musculoskeletal:no cyanosis of digits and no clubbing  NEURO: alert & oriented x 3 with fluent speech, no focal motor/sensory deficits  Labs:  Lab Results  Component Value Date   WBC 6.4 06/28/2013   HGB 12.6 06/28/2013   HCT 38.7 06/28/2013   MCV 91.0 06/28/2013   PLT 188 06/28/2013   NEUTROABS 3.4 06/28/2013      Chemistry      Component Value Date/Time   NA 143 06/28/2013 1021   NA 140 06/15/2013 0832   K 3.9 06/28/2013 1021   K 3.3* 06/15/2013 0832   CL 103 06/15/2013 0832   CL 97* 12/28/2012 0930   CO2 25 06/28/2013 1021   CO2 28 06/15/2013 0832   BUN 8.7 06/28/2013 1021   BUN 13 06/15/2013 0832   CREATININE 1.0 06/28/2013 1021   CREATININE 1.0 06/15/2013 0832      Component Value Date/Time   CALCIUM 9.6 06/28/2013 1021   CALCIUM 9.1 06/15/2013 0832   ALKPHOS 96 06/28/2013 1021   ALKPHOS 70 06/07/2013 0922   AST 47* 06/28/2013 1021   AST 50* 06/07/2013 0922   ALT 46 06/28/2013 1021   ALT 53* 06/07/2013 0922   BILITOT 0.34 06/28/2013 1021   BILITOT 0.7 06/07/2013  0922     RADIOGRAPHIC STUDIES: Mm Digital Screening  06/04/2013   *RADIOLOGY REPORT*  Clinical Data: Screening. Prior malignant left breast lumpectomy in April, 2006 with subsequent radiation therapy.  DIGITAL SCREENING BILATERAL MAMMOGRAM WITH CAD  Comparison:  Previous exam(s).  FINDINGS:  ACR Breast Density Category b:  There are scattered areas of fibroglandular density.  There are no findings suspicious for malignancy. Expected post lumpectomy changes in the left breast.  Images were processed with CAD.  IMPRESSION: No mammographic evidence of malignancy.  A result letter of this  screening mammogram will be mailed directly to the patient.  RECOMMENDATION: Screening mammogram in one year. (Code:SM-B-01Y)  BI-RADS CATEGORY 2:  Benign finding(s).   Original Report Authenticated By: Evangeline Dakin, M.D.   PATHOLOGY: Diagnosis Endometrium, biopsy - DEGENERATING SECRETORY-TYPE ENDOMETRIUM. - THERE IS NO EVIDENCE OF HYPERPLASIA OR MALIGNANCY. - SEE COMMENT. Microscopic Comment This pattern can be associated with menses, irregular shedding or breakthrough bleeding associated with hormonal therapy. Clinical correlation is suggested. (JBK:caf 05/14/13) Enid Cutter MD Pathologist, Electronic Signature (Case signed 05/14/2013)  ASSESSMENT: [name] 67 y.o. female with a history of Breast cancer, unspecified laterality - Plan: CBC with Differential, Comprehensive metabolic panel, DG Chest 2 View  Postmenopausal vaginal bleeding  OXYGEN-USE OF SUPPLEMENTAL  Morbid obesity with BMI of 50.0-59.9, adult  Obesity hypoventilation syndrome - Plan: CBC with Differential, Comprehensive metabolic panel, DG Chest 2 View  Abnormal liver function tests - Plan: CBC with Differential, Comprehensive metabolic panel, DG Chest 2 View  PLAN:  1. T2N0, stage IIA triple negative infiltrating ductal carcinoma of the left breast (2006).  --She is s/p AC x 4 and then weekly Taxotere completed on April, 2007.  She has received radiation treatments between April and June of 2007.   --She is now 8 years from the time of diagnosis without evidence of disease.  Patient was initially offered to return every 3 months, but has declined and wished to return on a 6 month basis.  She will continue screening mammograms.   We will plan on a repeat CXR on her next visit.    2. Postmenopausal vaginal bleeding. --She has had one biopsy which has been negative for malignancy.  She will continue close follow up with her Ob/Gyn.  Her hemoglobin is stable.   She reports that she was not a candidate for  hysterectomy.   3. Afib  --She will continue her apixaban 5 mg bid.  She was counseled extensively on symptoms of anemia and worsening bleeding in the setting of #2. She understands and agrees to call her Ob/Gyn for repeat testing if symptoms persist.   All questions were answered. The patient knows to call the clinic with any problems, questions or concerns. We can certainly see the patient much sooner if necessary.  The patient was provided an after visit summary.   I spent 15 minutes counseling the patient face to face. The total time spent in the appointment was 25 minutes.    Myranda Pavone, MD 06/29/2013 5:00 PM

## 2013-06-29 NOTE — Telephone Encounter (Signed)
New message   ? About taking post menopause rx with heart rx

## 2013-06-29 NOTE — Telephone Encounter (Signed)
Discussed with sally earl pharm md, pt made aware there is no interaction between eliquis and megace.

## 2013-06-29 NOTE — Telephone Encounter (Signed)
Spoke with pt, she was restarted on eliquis and the vaginal bleeding she has had before restarted. She was given megace in the past to stop the bleeding. She has a call into the GYN to see if can restart that med and wants to make sure there will be no interaction between the two meds. Will discuss with pharm md and let the pt know. Also she had labs done at the cancer center. Will review and see if she needs anything else done.

## 2013-07-02 ENCOUNTER — Telehealth: Payer: Self-pay | Admitting: *Deleted

## 2013-07-02 DIAGNOSIS — Z23 Encounter for immunization: Secondary | ICD-10-CM | POA: Diagnosis not present

## 2013-07-02 NOTE — Telephone Encounter (Signed)
Marceline called and left a message stating she was seeing Dr. Elly Modena and was put on megace for postmenopausal bleeding and it stopped. States then started on blood thinners and the bleeding returned.  Wants Dr. Elly Modena to put her medicine back in the system- and should she stay on it longterm?  Per chart saw Dr. Elly Modena in 04/2013 and 05/2013 for postmenopausal bleeding- put on megace 05/11/13 with 2 refills- has history of many medical problems. Discussed with Dr. Harolyn Rutherford patient may continue megace while on blood thinners- should have refills left. Per Dr. Elly Modena note should come back in for reevaluation if bleeding returns. Called [facility] and she states she knows she had refills , but wanted to know if it is ok to take megace while on blood thinners- verified with Dr. Harolyn Rutherford and patient may continue megace. Also informed her she should be reevaluated per Dr. Lutricia Horsfall notes.  Informed her I would have front office call her with an appt. She states they can call her and leave a message if they don't get her, or send a note in My Chart.

## 2013-07-12 ENCOUNTER — Ambulatory Visit: Payer: Medicare Other | Admitting: Internal Medicine

## 2013-07-18 ENCOUNTER — Ambulatory Visit (INDEPENDENT_AMBULATORY_CARE_PROVIDER_SITE_OTHER): Payer: Medicare Other | Admitting: General Practice

## 2013-07-18 DIAGNOSIS — I4892 Unspecified atrial flutter: Secondary | ICD-10-CM

## 2013-07-18 DIAGNOSIS — Z7901 Long term (current) use of anticoagulants: Secondary | ICD-10-CM

## 2013-07-18 DIAGNOSIS — R000 Tachycardia, unspecified: Secondary | ICD-10-CM

## 2013-07-19 ENCOUNTER — Encounter: Payer: Self-pay | Admitting: Internal Medicine

## 2013-07-19 DIAGNOSIS — E876 Hypokalemia: Secondary | ICD-10-CM

## 2013-07-20 NOTE — Telephone Encounter (Signed)
I think there is some confusion on pt and my part, and I think may be best addressed by cardiology; pt states she was intended to go to TID potassium, but rx is only for bid   Will ask Dr Stanford Breed to re-address

## 2013-07-20 NOTE — Telephone Encounter (Signed)
The patient is requesting a refill on her Potassium.  She states she will be out in a couple of day and the pharmacy will not refill until July 26, 2013.  Please advise on refill.

## 2013-07-21 ENCOUNTER — Telehealth: Payer: Self-pay | Admitting: Physician Assistant

## 2013-07-21 DIAGNOSIS — E876 Hypokalemia: Secondary | ICD-10-CM

## 2013-07-21 MED ORDER — POTASSIUM CHLORIDE CRYS ER 20 MEQ PO TBCR: 20.0000 meq | EXTENDED_RELEASE_TABLET | Freq: Three times a day (TID) | ORAL | Status: DC

## 2013-07-21 NOTE — Telephone Encounter (Signed)
Pt called on Saturday with concern about her potassium tablets. She was supposed to have this refilled recently but neither [facility] nor [facility] received the rx. She was told it was awaiting authorization from our office but I see it was sent in on 06/15/13. She has been taking KCl 97mq BID because she didn't have enough to space out to TID. K+ level was low in September prompting the increase to 224m TID, and repeat bloodwork while on that dose on 06/28/13 showed K level of 3.9 (confirmed with pt that was the dose she was on at the time). I offered to call in short term rx over the weekend although we don't usually do this, to give her time for our office to sort out why it has not been approved. She is having repeat bloodwork on Wednesday through primary doctor and will make sure Dr. CrStanford Breedets a copy as well. I e-scribed to [facility] on [address] - KCl 2045mTID disp #90 zero RF. Dayna Dunn PA-C

## 2013-07-24 ENCOUNTER — Other Ambulatory Visit: Payer: Self-pay | Admitting: *Deleted

## 2013-07-24 DIAGNOSIS — E876 Hypokalemia: Secondary | ICD-10-CM

## 2013-07-24 MED ORDER — POTASSIUM CHLORIDE CRYS ER 20 MEQ PO TBCR: 20.0000 meq | EXTENDED_RELEASE_TABLET | Freq: Three times a day (TID) | ORAL | Status: DC

## 2013-07-25 ENCOUNTER — Ambulatory Visit (INDEPENDENT_AMBULATORY_CARE_PROVIDER_SITE_OTHER): Payer: Medicare Other | Admitting: Obstetrics & Gynecology

## 2013-07-25 ENCOUNTER — Encounter: Payer: Self-pay | Admitting: Internal Medicine

## 2013-07-25 ENCOUNTER — Encounter: Payer: Self-pay | Admitting: Obstetrics & Gynecology

## 2013-07-25 ENCOUNTER — Other Ambulatory Visit (INDEPENDENT_AMBULATORY_CARE_PROVIDER_SITE_OTHER): Payer: Medicare Other

## 2013-07-25 ENCOUNTER — Ambulatory Visit (INDEPENDENT_AMBULATORY_CARE_PROVIDER_SITE_OTHER): Payer: Medicare Other | Admitting: Internal Medicine

## 2013-07-25 VITALS — BP 137/85 | HR 115 | Temp 98.0°F | Ht 65.5 in | Wt 344.8 lb

## 2013-07-25 VITALS — BP 120/82 | HR 113 | Temp 98.1°F | Ht 65.5 in | Wt 346.4 lb

## 2013-07-25 DIAGNOSIS — N760 Acute vaginitis: Secondary | ICD-10-CM | POA: Diagnosis not present

## 2013-07-25 DIAGNOSIS — R7989 Other specified abnormal findings of blood chemistry: Secondary | ICD-10-CM | POA: Diagnosis not present

## 2013-07-25 DIAGNOSIS — E785 Hyperlipidemia, unspecified: Secondary | ICD-10-CM

## 2013-07-25 DIAGNOSIS — N950 Postmenopausal bleeding: Secondary | ICD-10-CM

## 2013-07-25 DIAGNOSIS — E876 Hypokalemia: Secondary | ICD-10-CM

## 2013-07-25 DIAGNOSIS — B9689 Other specified bacterial agents as the cause of diseases classified elsewhere: Secondary | ICD-10-CM

## 2013-07-25 DIAGNOSIS — A499 Bacterial infection, unspecified: Secondary | ICD-10-CM

## 2013-07-25 DIAGNOSIS — R945 Abnormal results of liver function studies: Secondary | ICD-10-CM

## 2013-07-25 DIAGNOSIS — I10 Essential (primary) hypertension: Secondary | ICD-10-CM

## 2013-07-25 DIAGNOSIS — Z23 Encounter for immunization: Secondary | ICD-10-CM | POA: Diagnosis not present

## 2013-07-25 DIAGNOSIS — F329 Major depressive disorder, single episode, unspecified: Secondary | ICD-10-CM

## 2013-07-25 LAB — BASIC METABOLIC PANEL
CO2: 28 mEq/L (ref 19–32)
Calcium: 9.7 mg/dL (ref 8.4–10.5)
Chloride: 103 mEq/L (ref 96–112)
Glucose, Bld: 102 mg/dL — ABNORMAL HIGH (ref 70–99)
Potassium: 3.9 mEq/L (ref 3.5–5.1)
Sodium: 140 mEq/L (ref 135–145)

## 2013-07-25 MED ORDER — POTASSIUM CHLORIDE CRYS ER 20 MEQ PO TBCR: 20.0000 meq | EXTENDED_RELEASE_TABLET | Freq: Three times a day (TID) | ORAL | Status: DC

## 2013-07-25 MED ORDER — LEVOTHYROXINE SODIUM 200 MCG PO TABS: 200.0000 ug | ORAL_TABLET | Freq: Every day | ORAL | Status: DC

## 2013-07-25 MED ORDER — MEGESTROL ACETATE 40 MG PO TABS: 40.0000 mg | ORAL_TABLET | Freq: Two times a day (BID) | ORAL | Status: DC

## 2013-07-25 MED ORDER — ALLOPURINOL 300 MG PO TABS: 300.0000 mg | ORAL_TABLET | Freq: Every day | ORAL | Status: DC

## 2013-07-25 MED ORDER — APIXABAN 5 MG PO TABS: 5.0000 mg | ORAL_TABLET | Freq: Two times a day (BID) | ORAL | Status: DC

## 2013-07-25 NOTE — Progress Notes (Signed)
Pre-visit discussion using our clinic review tool. No additional management support is needed unless otherwise documented below in the visit note.  

## 2013-07-25 NOTE — Progress Notes (Addendum)
Subjective:    Patient ID: [name] [name], female    DOB: [birth year], 67 y.o.   MRN: [id number]  HPI   Here to f/u; overall doing ok,  Pt denies chest pain, increased sob or doe, wheezing, orthopnea, PND, increased LE swelling, palpitations, dizziness or syncope.  Pt denies polydipsia, polyuria, or low sugar symptoms such as weakness or confusion improved with po intake.  Pt denies new neurological symptoms such as new headache, or facial or extremity weakness or numbness.   Pt states overall good compliance with meds, has been trying to follow lower cholesterol diet, with wt overall stable,  but little exercise however.  Saw GYN, uterine biopsy neg per pt; had recurrent heavy vag bleeding on restart eliquis. Has GYN f/u later today to re-assess, as well as unusual odor and possible vaginal d/c.  For K f/u today on high dose replacement now at 20 tid. Suggested per card to see GI regarding liver so that if neg eval might re-consider statin start.  Also due for f/u colonoscopy as last exam suboptimal prep.  Due for prevnar, and she is ok with paying cash for zostavax.  With so many problems now with worsening depression symptoms, no SI or HI, worse in the past 2 wks, does not want more meds, wants to cont the cymbalta for pain. Past Medical History  Diagnosis Date  . Hypertension   . Hyperlipidemia   . Anemia   . CAD (coronary artery disease)     a. Nonobstructive CAD 2007 (EF 75%.  RCA  proximal 75% tubular, 25% prox LAD, 25% d1, 50% D2) at [facility]. b. NSTEMI 01/2010 in setting of respiratory failure, medical approach (not a good candidate for noninvasive eval)  . OSA on CPAP   . Hypothyroidism   . Peripheral edema 06/13/2011  . Asthma 06/13/2011  . Depression 06/13/2011  . Neuropathy 06/13/2011  . Cervical radiculopathy 06/13/2011  . Gout 06/13/2011  . Obesity hypoventilation syndrome 06/13/2011  . COPD (chronic obstructive pulmonary disease)     on O2  . Breast cancer dx'd 2005    left  . Cellulitis      hx of cellulitis in left leg  . Impaired glucose tolerance 11/01/2012  . PFO (patent foramen ovale)   . Atrial flutter    Past Surgical History  Procedure Laterality Date  . Ovarian cyst removal    . Breast lumpectomy    . Cervical disc surgery      have cadaver bones,, screws, and plates   . Cervical biopsy  June 2013    at [facility] for Heavy  Bleeding  . Dilation and curettage of uterus    . Colonoscopy    . Laparoscopic appendectomy  03/29/2012    Procedure: APPENDECTOMY LAPAROSCOPIC;  Surgeon: Adin Hector, MD;  Location: [facility];  Service: General;  Laterality: N/A;  . Tee without cardioversion N/A 03/15/2013    Procedure: TRANSESOPHAGEAL ECHOCARDIOGRAM (TEE);  Surgeon: Thayer Headings, MD;  Location: [place];  Service: Cardiovascular;  Laterality: N/A;  . Cardioversion N/A 03/15/2013    Procedure: CARDIOVERSION;  Surgeon: Thayer Headings, MD;  Location: [facility];  Service: Cardiovascular;  Laterality: N/A;    reports that she quit smoking about 14 years ago. Her smoking use included Cigarettes. She has a 20 pack-year smoking history. She has never used smokeless tobacco. She reports that she drinks alcohol. She reports that she does not use illicit drugs. family history includes Breast cancer in an other family member; Cancer  in her maternal aunt and mother; Cancer (age of onset: 41) in her sister; Cancer (age of onset: 65) in her cousin; Diabetes in her brother; Heart disease in her mother; Uterine cancer in her mother. Allergies  Allergen Reactions  . Codeine     REACTION: unknown - pt. states unaware of having reaction to codeine  . Lisinopril     REACTION: cough   Current Outpatient Prescriptions on File Prior to Visit  Medication Sig Dispense Refill  . albuterol (PROVENTIL HFA;VENTOLIN HFA) 108 (90 BASE) MCG/ACT inhaler Inhale 2 puffs into the lungs every 6 (six) hours as needed for wheezing.      [name] amitriptyline (ELAVIL) 100 MG tablet TAKE 1 TABLET AT BEDTIME  90  tablet  1  . amLODipine (NORVASC) 2.5 MG tablet Take 2.5 mg by mouth as needed (hbp).      . Cholecalciferol (VITAMIN D) 2000 UNITS tablet Take 2,000 Units by mouth at bedtime.      . DULoxetine (CYMBALTA) 30 MG capsule Take 60 mg by mouth daily.      . ferrous sulfate 325 (65 FE) MG tablet Take 325 mg by mouth daily with breakfast.      . furosemide (LASIX) 40 MG tablet Take 40 mg by mouth See admin instructions. Take 1 tablet every morning then 1 tablet every other day at night      . Omega-3 Fatty Acids (FISH OIL PO) Take 1 capsule by mouth at bedtime.      . vitamin C (ASCORBIC ACID) 500 MG tablet Take 500 mg by mouth at bedtime.       No current facility-administered medications on file prior to visit.   Review of Systems  Constitutional: Negative for unexpected weight change, or unusual diaphoresis  HENT: Negative for tinnitus.   Eyes: Negative for photophobia and visual disturbance.  Respiratory: Negative for choking and stridor.   Gastrointestinal: Negative for vomiting and blood in stool.  Genitourinary: Negative for hematuria and decreased urine volume.  Musculoskeletal: Negative for acute joint swelling Skin: Negative for color change and wound.  Neurological: Negative for tremors and numbness other than noted  Psychiatric/Behavioral: Negative for decreased concentration or  hyperactivity.       Objective:   Physical Exam BP 120/82  Pulse 113  Temp(Src) 98.1 F (36.7 C) (Oral)  Ht 5' 5.5" (1.664 m)  Wt 346 lb 6 oz (157.115 kg)  BMI 56.74 kg/m2  SpO2 94% VS noted,  Constitutional: Pt appears well-developed and well-nourished.  HENT: Head: NCAT.  Right Ear: External ear normal.  Left Ear: External ear normal.  Eyes: Conjunctivae and EOM are normal. Pupils are equal, round, and reactive to light.  Neck: Normal range of motion. Neck supple.  Cardiovascular: Normal rate and regular rhythm.   Pulmonary/Chest: Effort normal and breath sounds decreased, no rales or  wheezing.  Abd:  Soft, NT, non-distended, + BS Neurological: Pt is alert. Not confused  Skin: Skin is warm. No erythema.  Psychiatric: Pt behavior is normal. Thought content normal. mild frustrated over general health, mild dysphoric    Assessment & Plan:  Quality Measures addressed:   Colorectal Cancer screening: pt declines all at this time, including FOBT, flex sig, colonoscopy CHF B-blocker tx for LVSD: pt declines further medication ACE/ARB therapy for CAD, Diabetes, and/or LVSD: pt declines further medication

## 2013-07-25 NOTE — Addendum Note (Signed)
Addended by: Sharon Seller B on: 07/25/2013 11:17 AM   Modules accepted: Orders

## 2013-07-25 NOTE — Assessment & Plan Note (Signed)
declienes further tx today or referral

## 2013-07-25 NOTE — Progress Notes (Signed)
Subjective:     Patient ID: [name] [name], female   DOB: Feb 17, 1946, 67 y.o.   MRN: [id number]  HPI Pt presents with c/o vaginal odor for 6 weeks.  She reports that it started after she began 2 new meds and was wondering if that  Was the cause.  She reports that it  Smells like 'kerosene'. She denies new soaps and denies douching.  She denies new onset discharge.  She has a h/o PMPB   Review of Systems     Objective:   Physical Exam BP 137/85  Pulse 115  Temp(Src) 98 F (36.7 C)  Ht 5' 5.5" (1.664 m)  Wt 344 lb 12.8 oz (156.4 kg)  BMI 56.48 kg/m2  Pt in NAD GU: EGBUS: no lesions Vagina: no blood in vault; no abnormal discharge Cervix: no lesion; no mucopurulent d/c  05/06/2013 Clinical Data: Post menstrual bleeding.  TRANSABDOMINAL AND TRANSVAGINAL ULTRASOUND OF PELVIS  Technique: Both transabdominal and transvaginal ultrasound  examinations of the pelvis were performed including evaluation of  the uterus, ovaries, adnexal regions, and pelvic cul-de-sac.  Comparison: CT 03/28/2012. Pelvic ultrasound 03/14/2011  Findings:  Uterus: Mildly enlarged, 10.5 x 4.3 x 7.8 cm. Left anterior fundal  fibroid measures 6.8 x 4.8 x 3.9 cm.  Endometrium: Mildly increased in thickness for post menopausal  state, 10 mm, homogeneous.  Right Ovary: Not visualized.  Left Ovary: Prior oophorectomy.  Other Findings: No free fluid or adnexal mass.  IMPRESSION:  Mildly enlarged uterus with 6.8 cm left fundal fibroid.  Mildly thickened endometrium. This could be further evaluated with  hysterosonogram or biopsy.          Assessment:     Vaginal odor- f/u wet mount PMPB- h/o thickened endometrium bleeding improved with Provera.  Pt is a poor candidate for surgery as she has multiple medical problems and she agrees to use prolonged Megace for 6 months and repeat sono.  She is aware of the risks of endometrial cancer     Plan:     Wet mount Megace 32m bid F/u in 6 months months or sooner  prn

## 2013-07-25 NOTE — Assessment & Plan Note (Signed)
To re-consider statin for lipid pending GI eval

## 2013-07-25 NOTE — Assessment & Plan Note (Signed)
For GI referral, may need colonoscopy as well

## 2013-07-25 NOTE — Assessment & Plan Note (Signed)
stable overall by history and exam, recent data reviewed with pt, and pt to continue medical treatment as before,  to f/u any worsening symptoms or concerns BP Readings from Last 3 Encounters:  07/25/13 120/82  06/28/13 141/76  06/07/13 118/74

## 2013-07-25 NOTE — Patient Instructions (Addendum)
Please return in 2 wks for a Nurse Visit for the new Prevnar pneumonia shot  Please use the prescription for the shingles shot at a pharmacy of your choice in about 2 weeks  We will need to followup on your potassium that has already been drawn, and send results to Dr Stanford Breed as well.   You will be contacted regarding the referral for: GI, for "liver evaluation" to see if you would be able to tolerate trying a statin, and you may need a follow up colonoscopy scheduled as well  Please continue all other medications as before, and refills have been done if requested. Please have the pharmacy call with any other refills you may need.  Please remember to sign up for My Chart if you have not done so, as this will be important to you in the future with finding out test results, communicating by private email, and scheduling acute appointments online when needed.  Please return in 4 months, or sooner if needed

## 2013-07-25 NOTE — Patient Instructions (Signed)
Postmenopausal Bleeding Menopause is commonly referred to as the "change in life." It is a time when the fertile years, the time of ovulating and having menstrual periods, has come to an end. It is also determined by not having menstrual periods for 12 months.  Postmenopausal bleeding is any bleeding a woman has after she has entered into menopause. Any type of postmenopausal bleeding, even if it appears to be a typical menstrual period, is concerning. This should be evaluated by your caregiver.  CAUSES   Hormone therapy.  Cancer of the cervix or cancer of the lining of the uterus (endometrial cancer).  Thinning of the uterine lining (uterine atrophy).  Thyroid diseases.  Certain medicines.  Infection of the uterus or cervix.  Inflammation or irritation of the uterine lining (endometritis).  Estrogen-secreting tumors.  Growths (polyps) on the cervix, uterine lining, or uterus.  Uterine tumors (fibroids).  Being very overweight (obese). DIAGNOSIS  Your caregiver will take a medical history and ask questions. A physical exam will also be performed. Further tests may include:   A transvaginal ultrasound. An ultrasound wand or probe is inserted into your vagina to view the pelvic organs.  A biopsy of the lining of the uterus (endometrium). A sample of the endometrium is removed and examined.  A hysteroscopy. Your caregiver may use an instrument with a light and a camera attached to it (hysteroscope). The hysteroscope is used to look inside the uterus for problems.  A dilation and curettage (D&C). Tissue is removed from the uterine lining to be examined for problems. TREATMENT  Treatment depends on the cause of the bleeding. Some treatments include:   Surgery.  Medicines.  Hormones.  A hysteroscopy or D&C to remove polyps or fibroids.  Changing or stopping a current medicine you are taking. Talk to your caregiver about your specific treatment. HOME CARE INSTRUCTIONS    Maintain a healthy weight.  Keep regular pelvic exams and Pap tests. SEEK MEDICAL CARE IF:   You have bleeding, even if it is light in comparison to your previous periods.  Your bleeding lasts more than 1 week.  You have abdominal pain.  You develop bleeding with sexual intercourse. SEEK IMMEDIATE MEDICAL CARE IF:   You have a fever, chills, headache, dizziness, muscle aches, and bleeding.  You have severe pain with bleeding.  You are passing blood clots.  You have bleeding and need more than 1 pad an hour.  You feel faint. MAKE SURE YOU:  Understand these instructions.  Will watch your condition.  Will get help right away if you are not doing well or get worse. Document Released: 12/15/2005 Document Revised: 11/29/2011 Document Reviewed: 04/05/2013 [facility] Patient Information 2014 [place], [place]. Vaginitis Vaginitis is an inflammation of the vagina. It is most often caused by a change in the normal balance of the bacteria and yeast that live in the vagina. This change in balance causes an overgrowth of certain bacteria or yeast, which causes the inflammation. There are different types of vaginitis, but the most common types are:  Bacterial vaginosis.  Yeast infection (candidiasis).  Trichomoniasis vaginitis. This is a sexually transmitted infection (STI).  Viral vaginitis.  Atropic vaginitis.  Allergic vaginitis. CAUSES  The cause depends on the type of vaginitis. Vaginitis can be caused by:  Bacteria (bacterial vaginosis).  Yeast (yeast infection).  A parasite (trichomoniasis vaginitis)  A virus (viral vaginitis).  Low hormone levels (atrophic vaginitis). Low hormone levels can occur during pregnancy, breastfeeding, or after menopause.  Irritants, such as  bubble baths, scented tampons, and feminine sprays (allergic vaginitis). Other factors can change the normal balance of the yeast and bacteria that live in the vagina. These include:  Antibiotic  medicines.  Poor hygiene.  Diaphragms, vaginal sponges, spermicides, birth control pills, and intrauterine devices (IUD).  Sexual intercourse.  Infection.  Uncontrolled diabetes.  A weakened immune system. SYMPTOMS  Symptoms can vary depending on the cause of the vaginitis. Common symptoms include:  Abnormal vaginal discharge.  The discharge is white, gray, or yellow with bacterial vaginosis.  The discharge is thick, white, and cheesy with a yeast infection.  The discharge is frothy and yellow or greenish with trichomoniasis.  A bad vaginal odor.  The odor is fishy with bacterial vaginosis.  Vaginal itching, pain, or swelling.  Painful intercourse.  Pain or burning when urinating. Sometimes, there are no symptoms. TREATMENT  Treatment will vary depending on the type of infection.   Bacterial vaginosis and trichomoniasis are often treated with antibiotic creams or pills.  Yeast infections are often treated with antifungal medicines, such as vaginal creams or suppositories.  Viral vaginitis has no cure, but symptoms can be treated with medicines that relieve discomfort. Your sexual partner should be treated as well.  Atrophic vaginitis may be treated with an estrogen cream, pill, suppository, or vaginal ring. If vaginal dryness occurs, lubricants and moisturizing creams may help. You may be told to avoid scented soaps, sprays, or douches.  Allergic vaginitis treatment involves quitting the use of the product that is causing the problem. Vaginal creams can be used to treat the symptoms. HOME CARE INSTRUCTIONS   Take all medicines as directed by your caregiver.  Keep your genital area clean and dry. Avoid soap and only rinse the area with water.  Avoid douching. It can remove the healthy bacteria in the vagina.  Do not use tampons or have sexual intercourse until your vaginitis has been treated. Use sanitary pads while you have vaginitis.  Wipe from front to back.  This avoids the spread of bacteria from the rectum to the vagina.  Let air reach your genital area.  Wear cotton underwear to decrease moisture buildup.  Avoid wearing underwear while you sleep until your vaginitis is gone.  Avoid tight pants and underwear or nylons without a cotton panel.  Take off wet clothing (especially bathing suits) as soon as possible.  Use mild, non-scented products. Avoid using irritants, such as:  Scented feminine sprays.  Fabric softeners.  Scented detergents.  Scented tampons.  Scented soaps or bubble baths.  Practice safe sex and use condoms. Condoms may prevent the spread of trichomoniasis and viral vaginitis. SEEK MEDICAL CARE IF:   You have abdominal pain.  You have a fever or persistent symptoms for more than 2 3 days.  You have a fever and your symptoms suddenly get worse. Document Released: 07/04/2007 Document Revised: 05/31/2012 Document Reviewed: 02/17/2012 [facility] Patient Information 2014 [place].

## 2013-07-25 NOTE — Assessment & Plan Note (Signed)
Not clear if needs long term TID 20 meq, will do rx, but plan to f/u K today and next visit, may need to reduce again to 20 bid depending on results  Note:  Total time today for pt hx, exam, review of record with pt in the room, determination of diagnoses and plan for further eval and tx is > 40 min, with over 50% spent in coordination and counseling of patient

## 2013-07-26 ENCOUNTER — Encounter: Payer: Self-pay | Admitting: Obstetrics & Gynecology

## 2013-07-26 LAB — WET PREP, GENITAL
Clue Cells Wet Prep HPF POC: NONE SEEN
Trich, Wet Prep: NONE SEEN
WBC, Wet Prep HPF POC: NONE SEEN
Yeast Wet Prep HPF POC: NONE SEEN

## 2013-08-26 ENCOUNTER — Other Ambulatory Visit: Payer: Self-pay

## 2013-08-26 ENCOUNTER — Encounter (HOSPITAL_COMMUNITY): Payer: Self-pay | Admitting: Emergency Medicine

## 2013-08-26 ENCOUNTER — Inpatient Hospital Stay (HOSPITAL_COMMUNITY)
Admission: EM | Admit: 2013-08-26 | Discharge: 2013-08-29 | DRG: 261 | Disposition: A | Discharge status: Home / Self Care | Payer: Medicare Other | Attending: Internal Medicine | Admitting: Internal Medicine

## 2013-08-26 ENCOUNTER — Emergency Department (HOSPITAL_COMMUNITY): Payer: Medicare Other

## 2013-08-26 DIAGNOSIS — R945 Abnormal results of liver function studies: Secondary | ICD-10-CM

## 2013-08-26 DIAGNOSIS — R000 Tachycardia, unspecified: Secondary | ICD-10-CM

## 2013-08-26 DIAGNOSIS — R7989 Other specified abnormal findings of blood chemistry: Secondary | ICD-10-CM

## 2013-08-26 DIAGNOSIS — Z6841 Body Mass Index (BMI) 40.0 and over, adult: Secondary | ICD-10-CM | POA: Diagnosis not present

## 2013-08-26 DIAGNOSIS — J4489 Other specified chronic obstructive pulmonary disease: Secondary | ICD-10-CM | POA: Diagnosis present

## 2013-08-26 DIAGNOSIS — W1789XA Other fall from one level to another, initial encounter: Secondary | ICD-10-CM | POA: Diagnosis present

## 2013-08-26 DIAGNOSIS — Z602 Problems related to living alone: Secondary | ICD-10-CM | POA: Diagnosis not present

## 2013-08-26 DIAGNOSIS — I509 Heart failure, unspecified: Secondary | ICD-10-CM | POA: Diagnosis present

## 2013-08-26 DIAGNOSIS — E663 Overweight: Secondary | ICD-10-CM | POA: Diagnosis present

## 2013-08-26 DIAGNOSIS — M109 Gout, unspecified: Secondary | ICD-10-CM | POA: Diagnosis present

## 2013-08-26 DIAGNOSIS — Z780 Asymptomatic menopausal state: Secondary | ICD-10-CM

## 2013-08-26 DIAGNOSIS — G4733 Obstructive sleep apnea (adult) (pediatric): Secondary | ICD-10-CM | POA: Diagnosis present

## 2013-08-26 DIAGNOSIS — G4730 Sleep apnea, unspecified: Secondary | ICD-10-CM | POA: Diagnosis present

## 2013-08-26 DIAGNOSIS — Z7901 Long term (current) use of anticoagulants: Secondary | ICD-10-CM

## 2013-08-26 DIAGNOSIS — I4892 Unspecified atrial flutter: Secondary | ICD-10-CM | POA: Diagnosis present

## 2013-08-26 DIAGNOSIS — Z9981 Dependence on supplemental oxygen: Secondary | ICD-10-CM

## 2013-08-26 DIAGNOSIS — E039 Hypothyroidism, unspecified: Secondary | ICD-10-CM | POA: Diagnosis not present

## 2013-08-26 DIAGNOSIS — E662 Morbid (severe) obesity with alveolar hypoventilation: Secondary | ICD-10-CM | POA: Diagnosis present

## 2013-08-26 DIAGNOSIS — F329 Major depressive disorder, single episode, unspecified: Secondary | ICD-10-CM | POA: Diagnosis present

## 2013-08-26 DIAGNOSIS — S0003XA Contusion of scalp, initial encounter: Secondary | ICD-10-CM | POA: Diagnosis present

## 2013-08-26 DIAGNOSIS — I5032 Chronic diastolic (congestive) heart failure: Secondary | ICD-10-CM | POA: Diagnosis present

## 2013-08-26 DIAGNOSIS — R404 Transient alteration of awareness: Secondary | ICD-10-CM | POA: Diagnosis not present

## 2013-08-26 DIAGNOSIS — R7302 Impaired glucose tolerance (oral): Secondary | ICD-10-CM

## 2013-08-26 DIAGNOSIS — M5416 Radiculopathy, lumbar region: Secondary | ICD-10-CM

## 2013-08-26 DIAGNOSIS — J449 Chronic obstructive pulmonary disease, unspecified: Secondary | ICD-10-CM | POA: Diagnosis present

## 2013-08-26 DIAGNOSIS — Q211 Atrial septal defect: Secondary | ICD-10-CM

## 2013-08-26 DIAGNOSIS — E785 Hyperlipidemia, unspecified: Secondary | ICD-10-CM | POA: Diagnosis present

## 2013-08-26 DIAGNOSIS — Z79899 Other long term (current) drug therapy: Secondary | ICD-10-CM | POA: Diagnosis not present

## 2013-08-26 DIAGNOSIS — I2510 Atherosclerotic heart disease of native coronary artery without angina pectoris: Secondary | ICD-10-CM | POA: Diagnosis present

## 2013-08-26 DIAGNOSIS — N950 Postmenopausal bleeding: Secondary | ICD-10-CM

## 2013-08-26 DIAGNOSIS — S0083XA Contusion of other part of head, initial encounter: Secondary | ICD-10-CM | POA: Diagnosis present

## 2013-08-26 DIAGNOSIS — Z0000 Encounter for general adult medical examination without abnormal findings: Secondary | ICD-10-CM

## 2013-08-26 DIAGNOSIS — Z87891 Personal history of nicotine dependence: Secondary | ICD-10-CM

## 2013-08-26 DIAGNOSIS — R55 Syncope and collapse: Secondary | ICD-10-CM | POA: Diagnosis not present

## 2013-08-26 DIAGNOSIS — I214 Non-ST elevation (NSTEMI) myocardial infarction: Secondary | ICD-10-CM

## 2013-08-26 DIAGNOSIS — I495 Sick sinus syndrome: Secondary | ICD-10-CM | POA: Diagnosis present

## 2013-08-26 DIAGNOSIS — E876 Hypokalemia: Secondary | ICD-10-CM

## 2013-08-26 DIAGNOSIS — F3289 Other specified depressive episodes: Secondary | ICD-10-CM | POA: Diagnosis present

## 2013-08-26 DIAGNOSIS — I5031 Acute diastolic (congestive) heart failure: Secondary | ICD-10-CM

## 2013-08-26 DIAGNOSIS — M5412 Radiculopathy, cervical region: Secondary | ICD-10-CM

## 2013-08-26 DIAGNOSIS — Z803 Family history of malignant neoplasm of breast: Secondary | ICD-10-CM

## 2013-08-26 DIAGNOSIS — I10 Essential (primary) hypertension: Secondary | ICD-10-CM | POA: Diagnosis present

## 2013-08-26 DIAGNOSIS — Z8049 Family history of malignant neoplasm of other genital organs: Secondary | ICD-10-CM

## 2013-08-26 DIAGNOSIS — I4891 Unspecified atrial fibrillation: Secondary | ICD-10-CM | POA: Diagnosis present

## 2013-08-26 DIAGNOSIS — I252 Old myocardial infarction: Secondary | ICD-10-CM

## 2013-08-26 DIAGNOSIS — Z8249 Family history of ischemic heart disease and other diseases of the circulatory system: Secondary | ICD-10-CM

## 2013-08-26 DIAGNOSIS — Z833 Family history of diabetes mellitus: Secondary | ICD-10-CM

## 2013-08-26 DIAGNOSIS — K635 Polyp of colon: Secondary | ICD-10-CM

## 2013-08-26 DIAGNOSIS — T1490XA Injury, unspecified, initial encounter: Secondary | ICD-10-CM | POA: Diagnosis not present

## 2013-08-26 DIAGNOSIS — Z853 Personal history of malignant neoplasm of breast: Secondary | ICD-10-CM

## 2013-08-26 DIAGNOSIS — G629 Polyneuropathy, unspecified: Secondary | ICD-10-CM

## 2013-08-26 DIAGNOSIS — R05 Cough: Secondary | ICD-10-CM

## 2013-08-26 DIAGNOSIS — J9819 Other pulmonary collapse: Secondary | ICD-10-CM | POA: Diagnosis not present

## 2013-08-26 DIAGNOSIS — K3580 Unspecified acute appendicitis: Secondary | ICD-10-CM

## 2013-08-26 LAB — URINE MICROSCOPIC-ADD ON

## 2013-08-26 LAB — URINALYSIS, ROUTINE W REFLEX MICROSCOPIC
Bilirubin Urine: NEGATIVE
Glucose, UA: NEGATIVE mg/dL
Ketones, ur: NEGATIVE mg/dL
Protein, ur: NEGATIVE mg/dL
pH: 6 (ref 5.0–8.0)

## 2013-08-26 LAB — BASIC METABOLIC PANEL
BUN: 10 mg/dL (ref 6–23)
CO2: 28 mEq/L (ref 19–32)
Calcium: 9.5 mg/dL (ref 8.4–10.5)
Creatinine, Ser: 0.91 mg/dL (ref 0.50–1.10)
GFR calc Af Amer: 74 mL/min — ABNORMAL LOW (ref >90)
Glucose, Bld: 91 mg/dL (ref 70–99)
Sodium: 141 mEq/L (ref 135–145)

## 2013-08-26 LAB — CBC WITH DIFFERENTIAL/PLATELET
Basophils Absolute: 0.1 10*3/uL (ref 0.0–0.1)
Eosinophils Absolute: 0.2 10*3/uL (ref 0.0–0.7)
Eosinophils Relative: 2 % (ref 0–5)
HCT: 41.2 % (ref 36.0–46.0)
Lymphocytes Relative: 31 % (ref 12–46)
MCH: 30 pg (ref 26.0–34.0)
MCHC: 33 g/dL (ref 30.0–36.0)
MCV: 90.9 fL (ref 78.0–100.0)
Monocytes Absolute: 0.4 10*3/uL (ref 0.1–1.0)
Monocytes Relative: 6 % (ref 3–12)
Platelets: 226 10*3/uL (ref 150–400)
RDW: 14.9 % (ref 11.5–15.5)
WBC: 7.4 10*3/uL (ref 4.0–10.5)

## 2013-08-26 LAB — POCT I-STAT TROPONIN I: Troponin i, poc: 0.03 ng/mL (ref 0.00–0.08)

## 2013-08-26 MED ORDER — SODIUM CHLORIDE 0.9 % IV BOLUS (SEPSIS)
500.0000 mL | Freq: Once | INTRAVENOUS | Status: AC
  Administered 2013-08-26: 500 mL via INTRAVENOUS

## 2013-08-26 NOTE — ED Provider Notes (Signed)
CSN: [id number]     Arrival date & time 08/26/13  1624 History   First MD Initiated Contact with Patient 08/26/13 1636     Chief Complaint  Patient presents with  . Loss of Consciousness   (Consider location/radiation/quality/duration/timing/severity/associated sxs/prior Treatment) HPI Comments: 67 year old female past medical history significant for COPD, CAD who comes in chief complaint syncope. Patient states she was at kitchen table and was very emotionally upset regarding family matters. She felt herself getting worked up. At that time she went to unlock her back door. Upon standing and walking across the kitchen she had a syncopal episode. This was unwitnessed. Patient remembers feeling lightheaded. Then woke up on ground. Unknown time of LOC. Has small bump on head where she struck. No focal weaknesses, sensation deficits or concerning neurological complaints. Patient is on blood thinners as she previously had atrial fibrillation. However patient status Dana Dorner cardioversion now in sinus rhythm  Patient is a 67 y.o. female presenting with syncope.  Loss of Consciousness Episode history:  Single Most recent episode:  Today Duration: unknown. Timing:  Rare Progression:  Resolved Chronicity:  New Context comment:  Ambulating Witnessed: no   Relieved by:  Nothing Worsened by:  Nothing tried Associated symptoms: anxiety   Associated symptoms: no chest pain, no diaphoresis, no dizziness, no focal weakness, no headaches, no nausea, no shortness of breath and no weakness     Past Medical History  Diagnosis Date  . Hypertension   . Hyperlipidemia   . Anemia   . CAD (coronary artery disease)     a. Nonobstructive CAD 2007 (EF 75%.  RCA  proximal 75% tubular, 25% prox LAD, 25% d1, 50% D2) at [facility]. b. NSTEMI 01/2010 in setting of respiratory failure, medical approach (not a good candidate for noninvasive eval)  . OSA on CPAP   . Hypothyroidism   . Peripheral edema 06/13/2011  . Asthma  06/13/2011  . Depression 06/13/2011  . Neuropathy 06/13/2011  . Cervical radiculopathy 06/13/2011  . Gout 06/13/2011  . Obesity hypoventilation syndrome 06/13/2011  . COPD (chronic obstructive pulmonary disease)     on O2  . Breast cancer dx'd 2005    left  . Cellulitis     hx of cellulitis in left leg  . Impaired glucose tolerance 11/01/2012  . PFO (patent foramen ovale)   . Atrial flutter    Past Surgical History  Procedure Laterality Date  . Ovarian cyst removal    . Breast lumpectomy    . Cervical disc surgery      have cadaver bones,, screws, and plates   . Cervical biopsy  June 2013    at [facility] for Heavy  Bleeding  . Dilation and curettage of uterus    . Colonoscopy    . Laparoscopic appendectomy  03/29/2012    Procedure: APPENDECTOMY LAPAROSCOPIC;  Surgeon: Adin Hector, MD;  Location: [facility];  Service: General;  Laterality: N/A;  . Tee without cardioversion N/A 03/15/2013    Procedure: TRANSESOPHAGEAL ECHOCARDIOGRAM (TEE);  Surgeon: Thayer Headings, MD;  Location: [place];  Service: Cardiovascular;  Laterality: N/A;  . Cardioversion N/A 03/15/2013    Procedure: CARDIOVERSION;  Surgeon: Thayer Headings, MD;  Location: [facility] ENDOSCOPY;  Service: Cardiovascular;  Laterality: N/A;   Family History  Problem Relation Age of Onset  . Breast cancer    . Uterine cancer Mother   . Cancer Mother     Theadora Rama  . Heart disease Mother   . Cancer Sister 28  breast  . Diabetes Brother   . Cancer Maternal Aunt     breast  . Cancer Cousin 40    breast   History  Substance Use Topics  . Smoking status: Former Smoker -- 1.00 packs/day for 20 years    Types: Cigarettes    Quit date: 09/20/1998  . Smokeless tobacco: Never Used     Comment: 1ppd x 20 years  . Alcohol Use: Yes     Comment: rare   OB History   Grav Para Term Preterm Abortions TAB SAB Ect Mult Living                 Review of Systems  Constitutional: Negative for diaphoresis.  Respiratory: Negative for  shortness of breath.   Cardiovascular: Positive for syncope. Negative for chest pain.  Gastrointestinal: Negative for nausea and abdominal pain.  Genitourinary: Positive for vaginal bleeding (been present for weeks. Followed by OB, ).  Neurological: Positive for syncope. Negative for dizziness, focal weakness, weakness and headaches.  All other systems reviewed and are negative.    Allergies  Codeine and Lisinopril  Home Medications   Current Outpatient Rx  Name  Route  Sig  Dispense  Refill  . albuterol (PROVENTIL HFA;VENTOLIN HFA) 108 (90 BASE) MCG/ACT inhaler   Inhalation   Inhale 2 puffs into the lungs every 6 (six) hours as needed for wheezing.         [name] allopurinol (ZYLOPRIM) 300 MG tablet   Oral   Take 1 tablet (300 mg total) by mouth daily.   90 tablet   3   . amitriptyline (ELAVIL) 100 MG tablet      TAKE 1 TABLET AT BEDTIME   90 tablet   1   . amLODipine (NORVASC) 2.5 MG tablet   Oral   Take 2.5 mg by mouth as needed (hbp).         [name] apixaban (ELIQUIS) 5 MG TABS tablet   Oral   Take 1 tablet (5 mg total) by mouth 2 (two) times daily.   180 tablet   3   . Cholecalciferol (VITAMIN D) 2000 UNITS tablet   Oral   Take 2,000 Units by mouth at bedtime.         . DULoxetine (CYMBALTA) 30 MG capsule   Oral   Take 60 mg by mouth daily.         . ferrous sulfate 325 (65 FE) MG tablet   Oral   Take 325 mg by mouth daily with breakfast.         . furosemide (LASIX) 40 MG tablet   Oral   Take 40 mg by mouth See admin instructions. Take 1 tablet every morning then 1 tablet every other day at night         . levothyroxine (SYNTHROID, LEVOTHROID) 200 MCG tablet   Oral   Take 1 tablet (200 mcg total) by mouth daily.   90 tablet   3   . megestrol (MEGACE) 40 MG tablet   Oral   Take 1 tablet (40 mg total) by mouth 2 (two) times daily.   60 tablet   6   . Omega-3 Fatty Acids (FISH OIL PO)   Oral   Take 1 capsule by mouth at bedtime.          . potassium chloride SA (K-DUR,KLOR-CON) 20 MEQ tablet   Oral   Take 1 tablet (20 mEq total) by mouth 3 (three) times daily.   270 tablet  3   . vitamin C (ASCORBIC ACID) 500 MG tablet   Oral   Take 500 mg by mouth at bedtime.          BP 112/56  Pulse 103  Temp(Src) 98.6 F (37 C) (Oral)  Resp 21  SpO2 100% Physical Exam  Nursing note and vitals reviewed. Constitutional: She is oriented to person, place, and time. She appears well-developed and well-nourished.  HENT:  Head: Normocephalic.  Small bump on R forehead, mild ttp, no lacerations or abrasions.    Eyes: EOM are normal. Pupils are equal, round, and reactive to light.  Neck: Normal range of motion.  Cardiovascular: Normal rate, regular rhythm and intact distal pulses.   No murmur heard. On my exam heart rates between 95 and 105. Sinus.  Pulmonary/Chest: Effort normal and breath sounds normal. No respiratory distress. She has no wheezes. She exhibits no tenderness.  Maintaining 99% on 2 L nasal cannula. Patient wears 2 L at home  Abdominal: Soft. She exhibits no distension. There is no tenderness.  Musculoskeletal: Normal range of motion.  Neurological: She is alert and oriented to person, place, and time. She displays normal reflexes. No cranial nerve deficit. She exhibits normal muscle tone. Coordination normal.  Cranial nerves intact. Motor sensation and coordination normal throughout  Skin: Skin is warm and dry.  Psychiatric: She has a normal mood and affect. Her behavior is normal. Judgment and thought content normal.    ED Course  Procedures (including critical care time) Labs Review Labs Reviewed  BASIC METABOLIC PANEL - Abnormal; Notable for the following:    GFR calc non Af Amer 64 (*)    GFR calc Af Amer 74 (*)    All other components within normal limits  URINALYSIS, ROUTINE W REFLEX MICROSCOPIC - Abnormal; Notable for the following:    APPearance CLOUDY (*)    Hgb urine dipstick LARGE (*)     Leukocytes, UA SMALL (*)    All other components within normal limits  URINE MICROSCOPIC-ADD ON - Abnormal; Notable for the following:    Bacteria, UA FEW (*)    All other components within normal limits  URINE CULTURE  CBC WITH DIFFERENTIAL  POCT I-STAT TROPONIN I   Imaging Review Ct Head Wo Contrast  08/26/2013   CLINICAL DATA:  Syncope.  Headache.  EXAM: CT HEAD WITHOUT CONTRAST  TECHNIQUE: Contiguous axial images were obtained from the base of the skull through the vertex without intravenous contrast.  COMPARISON:  04/01/2012.  FINDINGS: Right frontal scalp hematoma. No skull fracture, intracranial hemorrhage or paranasal sinus air-fluid levels. Stable mildly enlarged ventricles and subarachnoid spaces.  IMPRESSION: 1. Right frontal scalp hematoma without skull fracture or intracranial hemorrhage. 2. Stable mild atrophy.   Electronically Signed   By: Enrique Sack M.D.   On: 08/26/2013 20:06    EKG Interpretation   None       Date: 08/26/2013  Rate: 111  Rhythm: normal sinus rhythm  QRS Axis: normal  Intervals: normal  ST/T Wave abnormalities: normal  Conduction Disutrbances:none  Narrative Interpretation:   Old EKG Reviewed: none available   MDM   1. Syncope    On arrival patient tachycardic manner vital signs within normal limits. Patient with syncopal episode at home. Most consistent with vasovagal type syncope. Patient denies any chest pain, shortness breath. Patient with history of atrial flutter. Status Lisvet Rasheed electric conversion. Patient in normal sinus rhythm here. EKG findings as noted above. No signs of overt ischemia or arrhythmia  causing syncope. Cardiac enzymes negative. UA shows no UTI. No reports cough, clear to auscultation bilaterally. Doubt pneumonia. No skin findings consistent with cellulitis or other infectious process to explain syncope. No metabolic abnormalities to explain syncope. Patient is on blood thinner secondary to previous atrial fibrillation. CT head  shows no concerning intracranial pathology. Patient with normal neurological exam. Doubt any serious intracranial abnormalities. . While in emergency department awaiting labs patient stated she didn't had episode where she felt lightheaded and felt as though she may syncopized. Patient was lying down at the time. Patient also remained persistently tachycardic in the ED despite fluids and rest. Patient with multiple risk factors including previous arrhythmias, CAD, diabetes. Secondary to syncope in setting of multiple risk factors for serious underlying pathology patient was admitted to medicine service for observation and continued mangement. Remained stable to further acute issues until transfer.    Robynn Pane, MD 08/27/13 0001

## 2013-08-26 NOTE — ED Notes (Signed)
Dr. Hal Hope OK'd pt to eat at this time v.o.v., no longer NPO.

## 2013-08-26 NOTE — ED Notes (Signed)
IV team at [facility]

## 2013-08-26 NOTE — ED Notes (Signed)
Alert, NAD, calm, interactive, speech clear, resps e/u, no dyspnea noted, denies sx or complaints, VSS.

## 2013-08-26 NOTE — ED Notes (Addendum)
Pt alert, NAD, calm, interactive, resting comfortably in stretcher, using smart phone (texting), VSS/ improved.

## 2013-08-26 NOTE — ED Notes (Signed)
Pt feels weird, unable to describe, incontinent of urine, able to self transfer top BSC, full linens and gown changed, returned to beed w/o incident or change, alert, NAD, calm, denies pain sob, dizziness or nausea, speech clear. VSS.

## 2013-08-26 NOTE — H&P (Signed)
[facility] History and Physical  [name] [name] HEN:[id number] DOB: [birth year] DOA: 08/26/2013  Referring physician: ER physician. PCP: [name], MD  Specialists: [facility] cardiology.  Chief Complaint: Loss of consciousness.  HPI: [name] [name] is a 67 y.o. female history of CAD, atrial flutter, sick sinus syndrome, hypothyroidism, hypertension, COPD, OSA had a brief episode of loss of consciousness at her house. Patient states that since morning she was stressed out and was not feeling well. She had checked her blood pressure twice and it ran high. She takes Norvasc only has needed if blood pressure is high. She was walking towards her room to take Norvasc when suddenly she became diaphoretic and experienced palpitations and next thing she tumor as was she was on the floor with her face down. She leaned consciousness spontaneously and did not have any chest pain or shortness of breath any focal deficits. She did hit her head and had a small hematoma in the right side of the forehead. EMS was called and patient was brought to the ER. EKG shows sinus tachycardia. CT head is negative for anything acute intracranially but does show hematoma. Patient is nonfocal and has been admitted for further observation.   Review of Systems: As presented in the history of presenting illness, rest negative.  Past Medical History  Diagnosis Date  . Hypertension   . Hyperlipidemia   . Anemia   . CAD (coronary artery disease)     a. Nonobstructive CAD 2007 (EF 75%.  RCA  proximal 75% tubular, 25% prox LAD, 25% d1, 50% D2) at [facility]. b. NSTEMI 01/2010 in setting of respiratory failure, medical approach (not a good candidate for noninvasive eval)  . OSA on CPAP   . Hypothyroidism   . Peripheral edema 06/13/2011  . Asthma 06/13/2011  . Depression 06/13/2011  . Neuropathy 06/13/2011  . Cervical radiculopathy 06/13/2011  . Gout 06/13/2011  . Obesity hypoventilation syndrome 06/13/2011  . COPD (chronic obstructive  pulmonary disease)     on O2  . Breast cancer dx'd 2005    left  . Cellulitis     hx of cellulitis in left leg  . Impaired glucose tolerance 11/01/2012  . PFO (patent foramen ovale)   . Atrial flutter    Past Surgical History  Procedure Laterality Date  . Ovarian cyst removal    . Breast lumpectomy    . Cervical disc surgery      have cadaver bones,, screws, and plates   . Cervical biopsy  June 2013    at [facility] for Heavy  Bleeding  . Dilation and curettage of uterus    . Colonoscopy    . Laparoscopic appendectomy  03/29/2012    Procedure: APPENDECTOMY LAPAROSCOPIC;  Surgeon: Adin Hector, MD;  Location: [facility];  Service: General;  Laterality: N/A;  . Tee without cardioversion N/A 03/15/2013    Procedure: TRANSESOPHAGEAL ECHOCARDIOGRAM (TEE);  Surgeon: Thayer Headings, MD;  Location: [place];  Service: Cardiovascular;  Laterality: N/A;  . Cardioversion N/A 03/15/2013    Procedure: CARDIOVERSION;  Surgeon: Thayer Headings, MD;  Location: [facility] ENDOSCOPY;  Service: Cardiovascular;  Laterality: N/A;   Social History:  reports that she quit smoking about 14 years ago. Her smoking use included Cigarettes. She has a 20 pack-year smoking history. She has never used smokeless tobacco. She reports that she drinks alcohol. She reports that she does not use illicit drugs. Where does patient live home. Can patient participate in ADLs? Yes.  Allergies  Allergen Reactions  .  Codeine     REACTION: unknown - pt. states unaware of having reaction to codeine  . Lisinopril     REACTION: cough    Family History:  Family History  Problem Relation Age of Onset  . Breast cancer    . Uterine cancer Mother   . Cancer Mother     Theadora Rama  . Heart disease Mother   . Cancer Sister 96    breast  . Diabetes Brother   . Cancer Maternal Aunt     breast  . Cancer Cousin 40    breast      Prior to Admission medications   Medication Sig Start Date End Date Taking? Authorizing Provider  albuterol  (PROVENTIL HFA;VENTOLIN HFA) 108 (90 BASE) MCG/ACT inhaler Inhale 2 puffs into the lungs every 6 (six) hours as needed for wheezing.   Yes Historical Provider, MD  allopurinol (ZYLOPRIM) 300 MG tablet Take 1 tablet (300 mg total) by mouth daily. 07/25/13  Yes Biagio Borg, MD  amitriptyline (ELAVIL) 100 MG tablet TAKE 1 TABLET AT BEDTIME 04/13/13  Yes Biagio Borg, MD  amLODipine (NORVASC) 2.5 MG tablet Take 2.5 mg by mouth as needed (hbp).   Yes Historical Provider, MD  apixaban (ELIQUIS) 5 MG TABS tablet Take 1 tablet (5 mg total) by mouth 2 (two) times daily. 07/25/13  Yes Biagio Borg, MD  Cholecalciferol (VITAMIN D) 2000 UNITS tablet Take 2,000 Units by mouth at bedtime.   Yes Historical Provider, MD  DULoxetine (CYMBALTA) 30 MG capsule Take 60 mg by mouth daily.   Yes Historical Provider, MD  ferrous sulfate 325 (65 FE) MG tablet Take 325 mg by mouth daily with breakfast.   Yes Historical Provider, MD  furosemide (LASIX) 40 MG tablet Take 40 mg by mouth See admin instructions. Take 1 tablet every morning then 1 tablet every other day at night   Yes Historical Provider, MD  levothyroxine (SYNTHROID, LEVOTHROID) 200 MCG tablet Take 1 tablet (200 mcg total) by mouth daily. 07/25/13  Yes Biagio Borg, MD  megestrol (MEGACE) 40 MG tablet Take 1 tablet (40 mg total) by mouth 2 (two) times daily. 07/25/13  Yes Lavonia Drafts, MD  Omega-3 Fatty Acids (FISH OIL PO) Take 1 capsule by mouth at bedtime.   Yes Historical Provider, MD  potassium chloride SA (K-DUR,KLOR-CON) 20 MEQ tablet Take 1 tablet (20 mEq total) by mouth 3 (three) times daily. 07/25/13  Yes Biagio Borg, MD  vitamin C (ASCORBIC ACID) 500 MG tablet Take 500 mg by mouth at bedtime.   Yes Historical Provider, MD    Physical Exam: Filed Vitals:   08/26/13 2300 08/26/13 2315 08/26/13 2330 08/26/13 2345  BP: 112/50 105/59 101/70 112/56  Pulse: 104 102 107 103  Temp:      TempSrc:      Resp: 22 19 23 21   SpO2: 100% 99% 99% 100%      General:  Well-developed and nourished.  Eyes: Anicteric no pallor. Mild congestion of the left eye.  ENT: Small right frontal hematoma. No discharge from ears eyes nose mouth.  Neck: No mass felt.  Cardiovascular: S1-S2 heard.  Respiratory: No rhonchi or crepitations.  Abdomen: Soft nontender bowel sounds present.  Skin: No rash.  Musculoskeletal: No edema.  Psychiatric: Appears normal.  Neurologic: Alert awake oriented to time place and person. Moves all extremities.  Labs on Admission:  Basic Metabolic Panel:  Recent Labs Lab 08/26/13 1810  NA 141  K 4.1  CL  102  CO2 28  GLUCOSE 91  BUN 10  CREATININE 0.91  CALCIUM 9.5   Liver Function Tests: No results found for this basename: AST, ALT, ALKPHOS, BILITOT, PROT, ALBUMIN,  in the last 168 hours No results found for this basename: LIPASE, AMYLASE,  in the last 168 hours No results found for this basename: AMMONIA,  in the last 168 hours CBC:  Recent Labs Lab 08/26/13 1810  WBC 7.4  NEUTROABS 4.5  HGB 13.6  HCT 41.2  MCV 90.9  PLT 226   Cardiac Enzymes: No results found for this basename: CKTOTAL, CKMB, CKMBINDEX, TROPONINI,  in the last 168 hours  BNP (last 3 results)  Recent Labs  05/06/13 1635  PROBNP 104.7   CBG: No results found for this basename: GLUCAP,  in the last 168 hours  Radiological Exams on Admission: Ct Head Wo Contrast  08/26/2013   CLINICAL DATA:  Syncope.  Headache.  EXAM: CT HEAD WITHOUT CONTRAST  TECHNIQUE: Contiguous axial images were obtained from the base of the skull through the vertex without intravenous contrast.  COMPARISON:  04/01/2012.  FINDINGS: Right frontal scalp hematoma. No skull fracture, intracranial hemorrhage or paranasal sinus air-fluid levels. Stable mildly enlarged ventricles and subarachnoid spaces.  IMPRESSION: 1. Right frontal scalp hematoma without skull fracture or intracranial hemorrhage. 2. Stable mild atrophy.   Electronically Signed   By:  Enrique Sack M.D.   On: 08/26/2013 20:06    EKG: Independently reviewed. Sinus tachycardia.  Assessment/Plan Principal Problem:   Syncope Active Problems:   HYPOTHYROIDISM   HYPERTENSION   OBSTRUCTIVE SLEEP APNEA   Atrial flutter   1. Syncope - check orthostatics. Closely following telemetry for any arrhythmias as patient has had previous episodes of sick sinus syndrome last year and at that time patient's beta blockers were discontinued and her symptoms improved. Patient presently has mild sinus tachycardia. Patient does have history of atrial flutter and will be closely monitor her rate. Continue apixaban. 2. History of CAD - denies any chest pain. 3. Hypothyroidism - continue Synthroid. Check TSH. 4. OSA - CPAP per respiratory. 5. Hypertension - patient only takes when necessary Norvasc. Patient has been placed on clear hydralazine for systolic blood pressure more on 60. 6. COPD - presently not wheezing. 7. History of diastolic CHF - presently appears compensated.    Code Status: Full code.  Family Communication: None.  Disposition Plan: Admit for observation.    KAKRAKANDY,ARSHAD N. [facility] Pager [phone].  If 7PM-7AM, please contact night-coverage [email or web address] Password [facility] 08/26/2013, 11:56 PM

## 2013-08-26 NOTE — ED Notes (Signed)
No changes, preparing to transport

## 2013-08-26 NOTE — ED Notes (Signed)
Pt to department via EMS- pt reports that witnessed syncopal episode this afternoon. Reports that she woke up on her floor. Pt has a headache, reports she also has some tingling in her hands. Bp-150/100 Hr-116 CBG-122. Wears home O2 at 2L [place].

## 2013-08-26 NOTE — ED Notes (Signed)
Pt states she felt funny this morning, states she took her BP, BP was high around 150/100, HR was 140 per pt. Pt went to unlock the door, pt became sweaty, pt passed out. Woke up with head on the steps, states she lost consciousness. Pt currently states she does not feel dizzy. Denies CP, denies SOB. Has had surgery in spine, states she has chronic tingling in hands and feet.

## 2013-08-26 NOTE — ED Notes (Signed)
L arm restricted, band in place, attempted IV x1 unsuccessful, IV team paged, will respond, pt to CT, alert, NAD, calm, no changes.

## 2013-08-26 NOTE — ED Notes (Signed)
inpt bed assignment received, admitting MD at [facility], no changes, pt alert, NAD, calm, interactive.

## 2013-08-27 ENCOUNTER — Encounter (HOSPITAL_COMMUNITY): Payer: Self-pay | Admitting: General Practice

## 2013-08-27 ENCOUNTER — Observation Stay (HOSPITAL_COMMUNITY): Payer: Medicare Other

## 2013-08-27 DIAGNOSIS — R55 Syncope and collapse: Secondary | ICD-10-CM | POA: Diagnosis not present

## 2013-08-27 LAB — CBC WITH DIFFERENTIAL/PLATELET
Basophils Absolute: 0 10*3/uL (ref 0.0–0.1)
Eosinophils Absolute: 0.2 10*3/uL (ref 0.0–0.7)
HCT: 39.1 % (ref 36.0–46.0)
Hemoglobin: 12.7 g/dL (ref 12.0–15.0)
Lymphocytes Relative: 27 % (ref 12–46)
Lymphs Abs: 2.6 10*3/uL (ref 0.7–4.0)
MCH: 29.5 pg (ref 26.0–34.0)
Monocytes Absolute: 0.5 10*3/uL (ref 0.1–1.0)
Neutro Abs: 6.2 10*3/uL (ref 1.7–7.7)
Platelets: 214 10*3/uL (ref 150–400)
RBC: 4.3 MIL/uL (ref 3.87–5.11)
RDW: 14.9 % (ref 11.5–15.5)
WBC: 9.5 10*3/uL (ref 4.0–10.5)

## 2013-08-27 LAB — COMPREHENSIVE METABOLIC PANEL
Alkaline Phosphatase: 74 U/L (ref 39–117)
BUN: 11 mg/dL (ref 6–23)
CO2: 28 mEq/L (ref 19–32)
Calcium: 9.3 mg/dL (ref 8.4–10.5)
Creatinine, Ser: 0.95 mg/dL (ref 0.50–1.10)
GFR calc Af Amer: 70 mL/min — ABNORMAL LOW (ref >90)
Glucose, Bld: 178 mg/dL — ABNORMAL HIGH (ref 70–99)
Potassium: 4 mEq/L (ref 3.5–5.1)
Total Protein: 7.2 g/dL (ref 6.0–8.3)

## 2013-08-27 LAB — TROPONIN I
Troponin I: <0.3 ng/mL (ref <0.30)
Troponin I: <0.3 ng/mL (ref <0.30)
Troponin I: <0.3 ng/mL (ref <0.30)

## 2013-08-27 MED ORDER — APIXABAN 5 MG PO TABS
5.0000 mg | ORAL_TABLET | Freq: Two times a day (BID) | ORAL | Status: DC
  Administered 2013-08-27 – 2013-08-29 (×6): 5 mg via ORAL
  Filled 2013-08-27 (×7): qty 1

## 2013-08-27 MED ORDER — LEVOTHYROXINE SODIUM 200 MCG PO TABS
200.0000 ug | ORAL_TABLET | Freq: Every day | ORAL | Status: DC
  Administered 2013-08-27 – 2013-08-29 (×3): 200 ug via ORAL
  Filled 2013-08-27 (×4): qty 1

## 2013-08-27 MED ORDER — FUROSEMIDE 40 MG PO TABS
40.0000 mg | ORAL_TABLET | Freq: Every day | ORAL | Status: DC
  Administered 2013-08-27 – 2013-08-29 (×3): 40 mg via ORAL
  Filled 2013-08-27 (×4): qty 1

## 2013-08-27 MED ORDER — MEGESTROL ACETATE 40 MG PO TABS
40.0000 mg | ORAL_TABLET | Freq: Two times a day (BID) | ORAL | Status: DC
  Administered 2013-08-27 – 2013-08-29 (×5): 40 mg via ORAL
  Filled 2013-08-27 (×6): qty 1

## 2013-08-27 MED ORDER — FERROUS SULFATE 325 (65 FE) MG PO TABS
325.0000 mg | ORAL_TABLET | Freq: Every day | ORAL | Status: DC
  Administered 2013-08-27 – 2013-08-29 (×3): 325 mg via ORAL
  Filled 2013-08-27 (×4): qty 1

## 2013-08-27 MED ORDER — ACETAMINOPHEN 325 MG PO TABS: 650.0000 mg | ORAL_TABLET | Freq: Four times a day (QID) | ORAL | Status: DC | PRN

## 2013-08-27 MED ORDER — ONDANSETRON HCL 4 MG PO TABS: 4.0000 mg | ORAL_TABLET | Freq: Four times a day (QID) | ORAL | Status: DC | PRN

## 2013-08-27 MED ORDER — DULOXETINE HCL 60 MG PO CPEP
60.0000 mg | ORAL_CAPSULE | Freq: Every day | ORAL | Status: DC
  Administered 2013-08-27 – 2013-08-29 (×2): 60 mg via ORAL
  Filled 2013-08-27 (×3): qty 1

## 2013-08-27 MED ORDER — ALLOPURINOL 300 MG PO TABS
300.0000 mg | ORAL_TABLET | Freq: Every day | ORAL | Status: DC
  Administered 2013-08-27 – 2013-08-29 (×3): 300 mg via ORAL
  Filled 2013-08-27 (×3): qty 1

## 2013-08-27 MED ORDER — VITAMIN C 500 MG PO TABS
500.0000 mg | ORAL_TABLET | Freq: Every day | ORAL | Status: DC
  Administered 2013-08-27 – 2013-08-28 (×2): 500 mg via ORAL
  Filled 2013-08-27 (×3): qty 1

## 2013-08-27 MED ORDER — SODIUM CHLORIDE 0.9 % IJ SOLN
3.0000 mL | Freq: Two times a day (BID) | INTRAMUSCULAR | Status: DC
  Administered 2013-08-28 (×2): 3 mL via INTRAVENOUS

## 2013-08-27 MED ORDER — FUROSEMIDE 40 MG PO TABS
40.0000 mg | ORAL_TABLET | ORAL | Status: DC
  Administered 2013-08-27: 17:00:00 40 mg via ORAL
  Filled 2013-08-27 (×2): qty 1

## 2013-08-27 MED ORDER — POTASSIUM CHLORIDE CRYS ER 20 MEQ PO TBCR
20.0000 meq | EXTENDED_RELEASE_TABLET | Freq: Three times a day (TID) | ORAL | Status: DC
  Administered 2013-08-27 – 2013-08-29 (×7): 20 meq via ORAL
  Filled 2013-08-27 (×11): qty 1

## 2013-08-27 MED ORDER — ACETAMINOPHEN 650 MG RE SUPP: 650.0000 mg | Freq: Four times a day (QID) | RECTAL | Status: DC | PRN

## 2013-08-27 MED ORDER — AMITRIPTYLINE HCL 100 MG PO TABS
100.0000 mg | ORAL_TABLET | Freq: Every day | ORAL | Status: DC
  Administered 2013-08-27 – 2013-08-28 (×3): 100 mg via ORAL
  Filled 2013-08-27 (×4): qty 1

## 2013-08-27 MED ORDER — OMEGA-3-ACID ETHYL ESTERS 1 G PO CAPS
1.0000 g | ORAL_CAPSULE | Freq: Every day | ORAL | Status: DC
  Administered 2013-08-27 – 2013-08-29 (×3): 1 g via ORAL
  Filled 2013-08-27 (×3): qty 1

## 2013-08-27 MED ORDER — SODIUM CHLORIDE 0.9 % IJ SOLN
3.0000 mL | Freq: Two times a day (BID) | INTRAMUSCULAR | Status: DC
  Administered 2013-08-27 – 2013-08-28 (×5): 3 mL via INTRAVENOUS

## 2013-08-27 MED ORDER — ONDANSETRON HCL 4 MG/2ML IJ SOLN: 4.0000 mg | Freq: Four times a day (QID) | INTRAMUSCULAR | Status: DC | PRN

## 2013-08-27 NOTE — Consult Note (Signed)
ELECTROPHYSIOLOGY CONSULT NOTE  Patient ID: [name] [name] MRN: [id number], DOB/AGE: [birth year]   Admit date: 08/26/2013 Date of Consult: 08/27/2013  Primary Physician: [name], MD Primary Cardiologist: Stanford Breed, MD Primary EP: Rayann Heman, MD Reason for Consultation: Syncope  History of Present Illness [name] [name] is a 67 y.o. female with moderate nonob CAD, HTN, OSA, hypothyroidism and COPD who has been admitted with syncope and palpitations. [name] [name] has h/o tachypalpitations when she feels stressed and states her heart races at least once or twice per month. She has SOB, diaphoresis and weakness with her palpitations. Over the last 2 weeks her palpitations have been more frequent. Yesterday she felt very stressed. When she checked her pulse it was 145 bpm and her pulse remained high for several minutes. She became SOB, weak, "clammy and very sweaty" and while walking down the hallway to her bedroom she passed out. All she remembers is waking up on the floor. She is unsure how long she was unconscious but knew she struck her head on the step in front of her laundry room. Once she regained consciousness, her palpitations, weakness and diaphoresis had resolved. On arrival, her BP was 103/58 and pulse 106 bpm. She remains in sinus tachycardia. EP has been asked to see for recommendations regarding syncope. Overnight [name] [name] tells me she had recurrent symptoms of racing palpitations, weakness and diaphoresis around 8-9 pm. On telemetry she was in sinus tachycardia.   History of recurrent episodes of presyncope. These   frequently occur After using the commode he is micturition or defecation. They're accompanied by profound diaphoresis some nausea and flushing but no significant residual fatigue. She recognizes the prodrome and it was the same prodrome that preceded her episode of syncope yesterday. Evaluation in the past has included a Holter monitor which has episodes; I do not see the report but she  says that it showed that her "blood pressure fell". I should note that she has had episodes where hyperkeratotic bradycardia has been noted with sinus bradycardia arrest and simultaneous AV conduction block.  She's had sinus tachycardia on off for the last couple of years. She checks her monitors at home almost daily and 5+ times a month her heart rate will be over 100 beats per minute. ECGs dating back to 2011 in the medical record Show a similar P wave morphology suggesting a sinus is the mechanism of recurrent tachycardia.  She has been on beta blockers in the past; It was stopped.  There is a comment from the medical record is suggested that near-syncope was associated with sinus bradycardia.   - LHC at [facility] 10/2005 - pRCA 75%, LM < 25%, pLAD 25%, D1 25%, D2 50%. NSTEMI in 01/2010 in the setting of SIRS from leg cellulitis. This was felt to be demand ischemia. Med Rx was pursued (felt to be a poor candidate for noninvasive imaging). - Evaluated by EP for near syncope and SSS with pauses by EP in 05/2012. Beta blockers and Provera were stopped. Follow-up event monitor demonstrated no significant arrhythmias. Plan to avoid AV nodal agents long term. - Admitted to [facility] with symptomatic atrial flutter with RVR June 2014. She was seen by EP. She was felt to be a poor candidate for atrial flutter ablation. TEE guided cardioversion was performed with restoration of SR. TEE - EF 55-60%, no LA clot, positive PFO, mild to moderate TR.  Past Medical History Past Medical History  Diagnosis Date  . Hypertension   . Hyperlipidemia   . Anemia   .  CAD (coronary artery disease)     a. Nonobstructive CAD 2007 (EF 75%.  RCA  proximal 75% tubular, 25% prox LAD, 25% d1, 50% D2) at [facility]. b. NSTEMI 01/2010 in setting of respiratory failure, medical approach (not a good candidate for noninvasive eval)  . OSA on CPAP   . Hypothyroidism   . Peripheral edema 06/13/2011  . Asthma 06/13/2011  . Depression 06/13/2011  .  Neuropathy 06/13/2011  . Cervical radiculopathy 06/13/2011  . Gout 06/13/2011  . Obesity hypoventilation syndrome 06/13/2011  . COPD (chronic obstructive pulmonary disease)     on O2  . Breast cancer dx'd 2005    left  . Cellulitis     hx of cellulitis in left leg  . Impaired glucose tolerance 11/01/2012  . PFO (patent foramen ovale)   . Atrial flutter     Past Surgical History Past Surgical History  Procedure Laterality Date  . Ovarian cyst removal    . Breast lumpectomy    . Cervical disc surgery      have cadaver bones,, screws, and plates   . Cervical biopsy  June 2013    at [facility] for Heavy  Bleeding  . Dilation and curettage of uterus    . Colonoscopy    . Laparoscopic appendectomy  03/29/2012    Procedure: APPENDECTOMY LAPAROSCOPIC;  Surgeon: Adin Hector, MD;  Location: [facility];  Service: General;  Laterality: N/A;  . Tee without cardioversion N/A 03/15/2013    Procedure: TRANSESOPHAGEAL ECHOCARDIOGRAM (TEE);  Surgeon: Thayer Headings, MD;  Location: [place];  Service: Cardiovascular;  Laterality: N/A;  . Cardioversion N/A 03/15/2013    Procedure: CARDIOVERSION;  Surgeon: Thayer Headings, MD;  Location: [facility] ENDOSCOPY;  Service: Cardiovascular;  Laterality: N/A;    Allergies/Intolerances Allergies  Allergen Reactions  . Codeine     REACTION: unknown - pt. states unaware of having reaction to codeine  . Lisinopril     REACTION: cough    Inpatient Medications . allopurinol  300 mg Oral Daily  . amitriptyline  100 mg Oral QHS  . apixaban  5 mg Oral BID  . DULoxetine  60 mg Oral Daily  . ferrous sulfate  325 mg Oral Q breakfast  . furosemide  40 mg Oral Q breakfast  . furosemide  40 mg Oral Q48H  . levothyroxine  200 mcg Oral QAC breakfast  . megestrol  40 mg Oral BID  . omega-3 acid ethyl esters  1 g Oral Daily  . potassium chloride SA  20 mEq Oral TID  . sodium chloride  3 mL Intravenous Q12H  . sodium chloride  3 mL Intravenous Q12H  . vitamin C  500 mg Oral  QHS    Family History Family History  Problem Relation Age of Onset  . Breast cancer    . Uterine cancer Mother   . Cancer Mother     Theadora Rama  . Heart disease Mother   . Cancer Sister 30    breast  . Diabetes Brother   . Cancer Maternal Aunt     breast  . Cancer Cousin 49    breast     Social History History   Social History  . Marital Status: Divorced    Spouse Name: N/A    Number of Children: N/A  . Years of Education: N/A   Occupational History  . Retired    Social History Main Topics  . Smoking status: Former Smoker -- 1.00 packs/day for 20 years  Types: Cigarettes    Quit date: 09/20/1998  . Smokeless tobacco: Never Used     Comment: 1ppd x 20 years  . Alcohol Use: 0.6 oz/week    1 Glasses of wine per week     Comment: rare  . Drug Use: No  . Sexual Activity: Not Currently    Birth Control/ Protection: Post-menopausal   Other Topics Concern  . Not on file   Social History Narrative   Lives alone in an apartment.     Review of Systems General: No chills, fever, night sweats or weight changes  Cardiovascular:  No chest pain, dyspnea on exertion, edema, orthopnea, paroxysmal nocturnal dyspnea Dermatological: No rash, lesions or masses Respiratory: No cough, dyspnea Urologic: No hematuria, dysuria Abdominal: No nausea, vomiting, diarrhea, bright red blood per rectum, melena, or hematemesis Neurologic: No visual changes, weakness, changes in mental status All other systems reviewed and are otherwise negative except as noted above.  Physical Exam Vitals: Blood pressure 151/78, pulse 125, temperature 97.9 F (36.6 C), temperature source Oral, resp. rate 18, height 5' 5.5" (1.664 m), weight 350 lb 12 oz (159.1 kg), SpO2 98.00%.  General: Well developed, well appearing, overweight 67 y.o. female in no acute distress. HEENT: Normocephalic, atraumatic. EOMs intact. Sclera nonicteric. Oropharynx clear.  Neck: Supple without bruits. No JVD. Lungs: Distant  breath sounds. Respirations regular and unlabored, CTA bilaterally. No wheezes, rales or rhonchi. Heart: Distant heart tones. RRR. S1, S2 present. No murmurs, rub, S3 or S4. Abdomen: Soft, non-distended.  Extremities: No clubbing, cyanosis or edema. DP/PT/Radials 2+ and equal bilaterally. Psych: Normal affect. Neuro: Alert and oriented X 3. Moves all extremities spontaneously. Musculoskeletal: No kyphosis. Skin: Intact. Warm and dry. No rashes or petechiae in exposed areas.   Labs  Recent Labs  08/27/13 0130 08/27/13 0659  TROPONINI <0.30 <0.30   Lab Results  Component Value Date   WBC 9.5 08/27/2013   HGB 12.7 08/27/2013   HCT 39.1 08/27/2013   MCV 90.9 08/27/2013   PLT 214 08/27/2013    Recent Labs Lab 08/27/13 0130  NA 141  K 4.0  CL 103  CO2 28  BUN 11  CREATININE 0.95  CALCIUM 9.3  PROT 7.2  BILITOT 0.6  ALKPHOS 74  ALT 32  AST 35  GLUCOSE 178*    Recent Labs  08/27/13 0130  TSH 1.585    Radiology/Studies Ct Head Wo Contrast 08/26/2013     IMPRESSION: 1. Right frontal scalp hematoma without skull fracture or intracranial hemorrhage. 2. Stable mild atrophy.    Electronically Signed   By: Enrique Sack M.D.   On: 08/26/2013 20:06   12-lead ECG on admission shows sinus tachycardia at 111 bpm; no ST-T wave abnormalities Telemetry shows sinus tachycardia; no arrhythmias; during her recurrent symptoms between 8-9 pm yesterday telemetry shows sinus tachycardia  Assessment and Plan 1. Syncope 2. Paroxysmal atrial flutter 3. CAD 4. COPD 5. HTN  [name] [name] presents with syncope of unclear etiology. She has had recurrent symptoms of weakness, palpitations and pre-syncope while here last night. On telemetry she had sinus tachycardia at the time.   Dr. Caryl Comes to see. Please see recommendations below. Signed, Ileene Hutchinson, PA-C 08/27/2013, 1:27 PM  The patient's history is as outlined above. I suspect that she has neurally mediated syncope/presyncope. It is  well described that episodes of presyncope do not necessarily reflect the same degree of coordination as to episodes of syncope. Hence I think it is reasonable to consider loop recording to  try to ascertain as to whether her syncopal episodes are associated with significant and of cardioversion that perhaps pacing with some to light of Biotronik CLS device might help mitigate some of her symptoms.  In addition, her sinus tachycardia is clearly not to her advantage physically and I would suggest that we try her on low-dose beta-blockade not withstanding the aforementioned observations. Apart from amitriptyline I do not see a likely culprit to contributing above; it appears to be quite necessary however for quality of her life  attenuating   her neuropathic symptoms resulting from her neck surgery  I will review with Dr. Nolon Rod. In the morning  I would also suggest further consideration of hysterectomy as her ongoing bleeding issues prevent her from exercising which would be to her long-term  advantaeg

## 2013-08-27 NOTE — Progress Notes (Signed)
Admitted pt to rm 4E04 from ED via stretcher, alert and oriented denied pain at this time, admission assessment done, orders carried out. SR/ST on heart monitor, call bell placed within reach. Will continue to monitor.

## 2013-08-27 NOTE — Progress Notes (Signed)
Patient set up for CPAP QHS using full face mask, per pt home regimen.  As patient states that she is unsure of settings, 10 cmH20 was used per protocol.  Room air, sats 94%.  Patient is familiar with equipment and procedure.

## 2013-08-27 NOTE — Consult Note (Signed)
CARDIOLOGY CONSULT NOTE   [name] MRN: [id number] DOB/AGE: 1945-10-04 67 y.o.  Admit date: 08/26/2013  Primary Physician   [name] Cower, MD Primary Cardiologist    BC/JA Reason for Consultation   Syncope  QIW:[id number] Figiel is a 67 y.o. female with a history of CAD, atrial flutter and near-syncope (secondary to sinus bradycardia, BB d/c'd).  Her EF is preserved. Last cath 2007, results below. Medical therapy recommended.  Admitted in June 2013 with syncope, pauses as long as 8.1 seconds. Metoprolol 150 mg a.m. And 50 mg p.m. Was discontinued. She was seen by Dr. Caryl [name]. "[name] sinus slowing prior to sinus arrest suggests a mechanism of hyper vagotonia as opposed to a primary arrhythmic issue." She had been on medroxyprogesterone and this was discontinued as well. "If were not able to identify a reversible trigger there are data suggesting that pacing particularly using [name] Biotronik CLS system may help obviate recurrent symptoms. This would be later referred."  She followed up with Dr. Rayann [name] in September of 2013. "I have reviewed her event monitor from June/July 2013 which revealed no significant arrhythmias off of beta blockers and medroxyprogesterone.Marland KitchenMarland KitchenShe should avoid AV nodal agents long term."  Admitted in June 2014 with atrial flutter, not an ablation candidate due to lung disease and morbid obesity. She had a TEE (+ PFO, mild-mod TR and EF 55%), then DCCV and was discharged in [place]. Felt to have tachy/brady syndrome and is not on any rate-lowering medications.  Last seen by [name] [name] 9/18, doing well, in SR. Has had intermittent hypokalemia, supplemented and improved.   Hx vaginal bleeding from thickened endometrium, coumadin discontinued and was later placed on Eliquis. She is taking Megace to control [name] bleeding.  Weight at 11/5 office visit, 156.4 kg.  Syncopal episode 12/7, admitted and cardiology asked to see her.  [name] [name] has episodes of tachycardia that occur  when she feels stressed. They were occurring approximately one or 2 times a month. She would feel [name] palpitations, she would break out in a sweat and she would feel weak. She would become incontinent of urine. She would lie down and rest and [name] symptoms would resolve. She would check her blood pressure and would notice her blood pressure was elevated. Her heart rate would be elevated as well. She would take Norvasc and rest and her symptoms would improve.  Over [name] last 2 weeks, [name] episodes have been occurring more frequently because she has been feeling stressed due to situations with family members/friends. She has described these episodes as panic attacks. There is no pain associated with them. She is aware that she should not let [name] situations of others affect her like this, but says she can't help it.   Yesterday, she began feeling stressed again and knew something was wrong. She took her blood pressure and her blood pressure was 156/84 with a heart rate of 145. She was concerned because her heart rate has never been that high before. She rested, but her symptoms did not resolve. She checked her blood pressure again and her blood pressure was then 135/100, but her heart rate was still 145. She was aware she might need help and was feeling weaker so she unlocked [name] back door. She started back towards her bedroom, and woke up on [name] floor. She does not know how long she was out. She has a hematoma on [name] right side of her face from her head hitting [name] step but denied other injuries.  When she woke up, she knew who she was and where she was. [name] palpitations had resolved. She was no longer diaphoretic.  Upon arrival in [name] emergency room, her blood pressure was 103/58 and her heart rate was 106. She has been in sinus tachycardia since admission. She has been taking Megace to control [name] vaginal bleeding but has still been bleeding for [name] last 2 weeks. Otherwise she feels that she is doing well. Her  weight on admission was 159.1 kg, up 3 kg from her office visit one month ago.   Past Medical History  Diagnosis Date  . Hypertension   . Hyperlipidemia   . Anemia   . CAD (coronary artery disease)     a. Nonobstructive CAD 2007 (EF 75%.  RCA  proximal 75% tubular, 25% prox LAD, 25% d1, 50% D2) at [facility]. b. NSTEMI 01/2010 in setting of respiratory failure, medical approach (not a good candidate for noninvasive eval)  . OSA on CPAP   . Hypothyroidism   . Peripheral edema 06/13/2011  . Asthma 06/13/2011  . Depression 06/13/2011  . Neuropathy 06/13/2011  . Cervical radiculopathy 06/13/2011  . Gout 06/13/2011  . Obesity hypoventilation syndrome 06/13/2011  . COPD (chronic obstructive pulmonary disease)     on O2  . Breast cancer dx'd 2005    left  . Cellulitis     hx of cellulitis in left leg  . Impaired glucose tolerance 11/01/2012  . PFO (patent foramen ovale)   . Atrial flutter      Past Surgical History  Procedure Laterality Date  . Ovarian cyst removal    . Breast lumpectomy    . Cervical disc surgery      have cadaver bones,, screws, and plates   . Cervical biopsy  June 2013    at [facility]  Bleeding  . Dilation and curettage of uterus    . Colonoscopy    . Laparoscopic appendectomy  03/29/2012    Procedure: APPENDECTOMY LAPAROSCOPIC;  Surgeon: [name] Hector, MD;  Location: [facility];  Service: General;  Laterality: N/A;  . Tee without cardioversion N/A 03/15/2013    Procedure: TRANSESOPHAGEAL ECHOCARDIOGRAM (TEE);  Surgeon: [name] Headings, MD;  Location: [place];  Service: Cardiovascular;  Laterality: N/A;  . Cardioversion N/A 03/15/2013    Procedure: CARDIOVERSION;  Surgeon: [name] Headings, MD;  Location: [facility] ENDOSCOPY;  Service: Cardiovascular;  Laterality: N/A;    Allergies  Allergen Reactions  . Codeine     REACTION: unknown - pt. states unaware of having reaction to codeine  . Lisinopril     REACTION: cough   I have reviewed [name] [name]'s current  medications . allopurinol  300 mg Oral Daily  . amitriptyline  100 mg Oral QHS  . apixaban  5 mg Oral BID  . DULoxetine  60 mg Oral Daily  . ferrous sulfate  325 mg Oral Q breakfast  . furosemide  40 mg Oral Q breakfast  . furosemide  40 mg Oral Q48H  . levothyroxine  200 mcg Oral QAC breakfast  . megestrol  40 mg Oral BID  . omega-3 acid ethyl esters  1 g Oral Daily  . potassium chloride SA  20 mEq Oral TID  . sodium chloride  3 mL Intravenous Q12H  . sodium chloride  3 mL Intravenous Q12H  . vitamin C  500 mg Oral QHS     acetaminophen, acetaminophen, ondansetron (ZOFRAN) IV, ondansetron  Medication Sig  albuterol (PROVENTIL HFA;VENTOLIN HFA) 108 (90  BASE) MCG/ACT inhaler Inhale 2 puffs into [name] lungs every 6 (six) hours as needed for wheezing.  allopurinol (ZYLOPRIM) 300 MG tablet Take 1 tablet (300 mg total) by mouth daily.  amitriptyline (ELAVIL) 100 MG tablet TAKE 1 TABLET AT BEDTIME  amLODipine (NORVASC) 2.5 MG tablet Take 2.5 mg by mouth as needed (hbp).  apixaban (ELIQUIS) 5 MG TABS tablet Take 1 tablet (5 mg total) by mouth 2 (two) times daily.  Cholecalciferol (VITAMIN D) 2000 UNITS tablet Take 2,000 Units by mouth at bedtime.  DULoxetine (CYMBALTA) 30 MG capsule Take 60 mg by mouth daily.  ferrous sulfate 325 (65 FE) MG tablet Take 325 mg by mouth daily with breakfast.  furosemide (LASIX) 40 MG tablet Take 40 mg by mouth See admin instructions. Take 1 tablet every morning then 1 tablet every other day at night  levothyroxine (SYNTHROID, LEVOTHROID) 200 MCG tablet Take 1 tablet (200 mcg total) by mouth daily.  megestrol (MEGACE) 40 MG tablet Take 1 tablet (40 mg total) by mouth 2 (two) times daily.  Omega-3 Fatty Acids (FISH OIL PO) Take 1 capsule by mouth at bedtime.  potassium chloride SA (K-DUR,KLOR-CON) 20 MEQ tablet Take 1 tablet (20 mEq total) by mouth 3 (three) times daily.  vitamin C (ASCORBIC ACID) 500 MG tablet Take 500 mg by mouth at bedtime.     History    Social History  . Marital Status: Divorced    Spouse Name: N/A    Number of Children: N/A  . Years of Education: N/A   Occupational History  . Retired    Social History Main Topics  . Smoking status: Former Smoker -- 1.00 packs/day for 20 years    Types: Cigarettes    Quit date: 09/20/1998  . Smokeless tobacco: Never Used     Comment: 1ppd x 20 years  . Alcohol Use: 0.6 oz/week    1 Glasses of wine per week     Comment: rare  . Drug Use: No  . Sexual Activity: Not Currently    Birth Control/ Protection: Post-menopausal   Other Topics Concern  . Not on file   Social History Narrative   Lives alone in an apartment.    Family Status  Relation Status Death Age  . Mother Deceased   . Father Deceased   . Sister Alive   . Cousin Alive    Family History  Problem Relation Age of Onset  . Breast cancer    . Uterine cancer Mother   . Cancer Mother     Theadora Rama  . Heart disease Mother   . Cancer Sister 46    breast  . Diabetes Brother   . Cancer Maternal Aunt     breast  . Cancer Cousin 40    breast     ROS:  Full 14 point review of systems complete and found to be negative unless listed above.  Physical Exam: Blood pressure 151/78, pulse 125, temperature 97.9 F (36.6 C), temperature source Oral, resp. rate 18, height 5' 5.5" (1.664 m), weight 350 lb 12 oz (159.1 kg), SpO2 98.00%.  General: Well developed, well nourished, female in no acute distress Head: Eyes PERRLA, No xanthomas.   Normocephalic and hematoma seen over [name] right occiput/temple, oropharynx without edema or exudate. Dentition: good Lungs: decreased breath sounds bases with some rales Heart: HRRR S1 S2, no rub/gallop, no murmur. pulses are 2+ both upper extrem.  Difficult to palpate in both lower extremities Neck: No carotid bruits. No lymphadenopathy.  JVDnot  seen as elevated but difficult to assess secondary to body habitus Abdomen: Bowel sounds present, abdomen soft and non-tender without masses  or hernias noted. Msk:  No spine or cva tenderness. No weakness, no joint deformities or effusions. Extremities: No clubbing or cyanosis. Trace-1+ edema.  Neuro: Alert and oriented X 3. No focal deficits noted. Psych:  Good affect, responds appropriately Skin: No rashes or lesions noted.  Labs:   Lab Results  Component Value Date   WBC 9.5 08/27/2013   HGB 12.7 08/27/2013   HCT 39.1 08/27/2013   MCV 90.9 08/27/2013   PLT 214 08/27/2013     Recent Labs Lab 08/27/13 0130  NA 141  K 4.0  CL 103  CO2 28  BUN 11  CREATININE 0.95  CALCIUM 9.3  PROT 7.2  BILITOT 0.6  ALKPHOS 74  ALT 32  AST 35  GLUCOSE 178*  ALBUMIN 3.6     Recent Labs  08/27/13 0130 08/27/13 0659  TROPONINI <0.30 <0.30    Recent Labs  08/26/13 1816  TROPIPOC 0.03   TSH  Date/Time Value Range Status  03/01/2013 10:41 AM 0.33* 0.35 - 5.50 uIU/mL Final   Echo: 03/15/2013 Study Conclusions - Left ventricle: Systolic function was normal. [name] estimated ejection fraction was in [name] range of 55% to 60%. - Aortic valve: No evidence of vegetation. No evidence of vegetation. - Left atrium: No evidence of thrombus in [name] atrial cavity or appendage.  ECG:  08/26/2013 Sinus tachycardia, no acute ischemic changes  Radiology:  Ct Head Wo Contrast 08/26/2013   CLINICAL DATA:  Syncope.  Headache.  EXAM: CT HEAD WITHOUT CONTRAST  TECHNIQUE: Contiguous axial images were obtained from [name] base of [name] skull through [name] vertex without intravenous contrast.  COMPARISON:  04/01/2012.  FINDINGS: Right frontal scalp hematoma. No skull fracture, intracranial hemorrhage or paranasal sinus air-fluid levels. Stable mildly enlarged ventricles and subarachnoid spaces.  IMPRESSION: 1. Right frontal scalp hematoma without skull fracture or intracranial hemorrhage. 2. Stable mild atrophy.   Electronically Signed   By: Enrique Sack M.D.   On: 08/26/2013 20:06    ASSESSMENT AND PLAN:   [name] [name] was seen today by Dr. Ron Parker, [name]  [name] evaluated and [name] data reviewed.  Principal Problem:   Syncope - just prior to syncope, [name] heart rate significantly elevated. Previous event monitor showed no pauses once her beta blocker was discontinued. PFO seen on TEE in June 2014 but she has no history of syncope or presyncope, except in [name] setting of arrhythmia.upon arrival to [name] emergency room, her heart rate was lower than seen on her home blood pressure machine, but was all sinus tachycardia. Suspicion for arrhythmia is strong. We will have EP see in a.m., to decide if an event monitor or other procedure is indicated. Until then, keep on telemetry.  Active Problems:   HYPOTHYROIDISM - TSH is pending, per IM   HYPERTENSION - blood pressure well controlled in [name] hospital, per IM   OBSTRUCTIVE SLEEP APNEA - continue CPAP, per IM   Atrial flutter - only sinus tachycardia seen on ECG and telemetry review. Follow on telemetry   Signed: Lenoard Aden 08/27/2013 9:21 AM Beeper [phone] [name] seen and examined. I agree with [name] assessment and plan as detailed above. See also my additional thoughts below.   [name] [name] is seen in currently stable. She's had difficulties with bradycardia before. In [name] past it was felt she did not need a pacemaker. She is now having recurrent spells.  Earlier this year she had atrial flutter with cardioversion. I cannot tell if her current spells represent bursts of tachycardia followed by pauses. It seems unlikely that her symptoms are related to her PFO. We need to obtain more information about her rhythm when she is having her episodes. An event recorder could be considered. However an implantable LINQ may provide even better information. I will consult electrophysiology to see if they feel that this is [name] most appropriate next step and arrange for [name] procedure.  Daryel November, MD  Dola Argyle, MD, [facility] 08/27/2013 12:05 PM

## 2013-08-27 NOTE — Progress Notes (Signed)
[facility]                                                                                Patient Demographics  [name] [name], is a 67 y.o. female, DOB - 08-14-1946, OEV:[id number]  Admit date - 08/26/2013   Admitting Physician Rise Patience, MD  Outpatient Primary MD for the patient is [name], MD  LOS - 1   Chief Complaint  Patient presents with  . Loss of Consciousness        Assessment & Plan    1.Syncope with H/O ? Sick Sinus - cardiology following the patient, await EP evaluation, not on any rate controlling agents so far. Monitor on telemetry. Symptom-free for now.     2. History of atrial flutter. Goal will be rate control, she is slightly tachycardic. Again will defer to cardiology on any rate controlling agents in in the light of previous episodes of bradycardia with rate controlling agents. On Eliquis which will be continued.     3. Hypothyroidism. TSH stable this admission continue home dose Synthroid.     4.OSA - CPAP continue tonight.     5. Fall with right frontal hematoma. Stable. CT scan stable.     6. History of COPD no acute issues. No wheezing on exam. Symptom-free.     7. Chronic diastolic CHF. No echo in the chart. Presently compensated continue home dose Lasix.     Code Status: Full  Family Communication:   Disposition Plan: Home   Procedures     Consults Cards   Medications  Scheduled Meds: . allopurinol  300 mg Oral Daily  . amitriptyline  100 mg Oral QHS  . apixaban  5 mg Oral BID  . DULoxetine  60 mg Oral Daily  . ferrous sulfate  325 mg Oral Q breakfast  . furosemide  40 mg Oral Q breakfast  . furosemide  40 mg Oral Q48H  . levothyroxine  200 mcg Oral QAC breakfast  . megestrol  40 mg Oral BID  . omega-3 acid ethyl esters  1 g Oral Daily  . potassium chloride SA  20 mEq Oral TID  . sodium chloride  3 mL Intravenous Q12H  . sodium chloride  3 mL Intravenous Q12H  . vitamin C  500 mg Oral QHS    Continuous Infusions:  PRN Meds:.acetaminophen, acetaminophen, ondansetron (ZOFRAN) IV, ondansetron  DVT Prophylaxis Eliquis  Lab Results  Component Value Date   PLT 214 08/27/2013    Antibiotics     Anti-infectives   None          Subjective:   [name] [name] today has, No headache, No chest pain, No abdominal pain - No Nausea, No new weakness tingling or numbness, No Cough - SOB.    Objective:   Filed Vitals:   08/27/13 0530 08/27/13 0841 08/27/13 0845 08/27/13 0848  BP: 117/59 105/41 111/54 151/78  Pulse: 110 112 113 125  Temp: 98.1 F (36.7 C) 97.9 F (36.6 C) 97.9 F (36.6 C) 97.9 F (36.6 C)  TempSrc: Oral Oral Oral Oral  Resp: 18 18 18 18   Height:      Weight:  SpO2: 98% 98% 98% 98%    Wt Readings from Last 3 Encounters:  08/27/13 159.1 kg (350 lb 12 oz)  07/25/13 156.4 kg (344 lb 12.8 oz)  07/25/13 157.115 kg (346 lb 6 oz)     Intake/Output Summary (Last 24 hours) at 08/27/13 1132 Last data filed at 08/27/13 3329  Gross per 24 hour  Intake    480 ml  Output    900 ml  Net   -420 ml    Exam Awake Alert, Oriented X 3, No new F.N deficits, Normal affect [place].AT,PERRAL Supple Neck,No JVD, No cervical lymphadenopathy appriciated.  Symmetrical Chest wall movement, Good air movement bilaterally, CTAB RRR,No Gallops,Rubs or new Murmurs, No Parasternal Heave +ve B.Sounds, Abd Soft, Non tender, No organomegaly appriciated, No rebound - guarding or rigidity. No Cyanosis, Clubbing or edema, No new Rash or bruise , R frontal bruise.   Data Review   Micro Results No results found for this or any previous visit (from the past 240 hour(s)).  Radiology Reports Ct Head Wo Contrast  08/26/2013   CLINICAL DATA:  Syncope.  Headache.  EXAM: CT HEAD WITHOUT CONTRAST  TECHNIQUE: Contiguous axial images were obtained from the base of the skull through the vertex without intravenous contrast.  COMPARISON:  04/01/2012.  FINDINGS: Right frontal scalp hematoma.  No skull fracture, intracranial hemorrhage or paranasal sinus air-fluid levels. Stable mildly enlarged ventricles and subarachnoid spaces.  IMPRESSION: 1. Right frontal scalp hematoma without skull fracture or intracranial hemorrhage. 2. Stable mild atrophy.   Electronically Signed   By: Enrique Sack M.D.   On: 08/26/2013 20:06    CBC  Recent Labs Lab 08/26/13 1810 08/27/13 0130  WBC 7.4 9.5  HGB 13.6 12.7  HCT 41.2 39.1  PLT 226 214  MCV 90.9 90.9  MCH 30.0 29.5  MCHC 33.0 32.5  RDW 14.9 14.9  LYMPHSABS 2.3 2.6  MONOABS 0.4 0.5  EOSABS 0.2 0.2  BASOSABS 0.1 0.0    Chemistries   Recent Labs Lab 08/26/13 1810 08/27/13 0130  NA 141 141  K 4.1 4.0  CL 102 103  CO2 28 28  GLUCOSE 91 178*  BUN 10 11  CREATININE 0.91 0.95  CALCIUM 9.5 9.3  AST  --  35  ALT  --  32  ALKPHOS  --  74  BILITOT  --  0.6   ------------------------------------------------------------------------------------------------------------------ estimated creatinine clearance is 89.4 ml/min (by C-G formula based on Cr of 0.95). ------------------------------------------------------------------------------------------------------------------ No results found for this basename: HGBA1C,  in the last 72 hours ------------------------------------------------------------------------------------------------------------------ No results found for this basename: CHOL, HDL, LDLCALC, TRIG, CHOLHDL, LDLDIRECT,  in the last 72 hours ------------------------------------------------------------------------------------------------------------------  Recent Labs  08/27/13 0130  TSH 1.585   ------------------------------------------------------------------------------------------------------------------ No results found for this basename: VITAMINB12, FOLATE, FERRITIN, TIBC, IRON, RETICCTPCT,  in the last 72 hours  Coagulation profile No results found for this basename: INR, PROTIME,  in the last 168 hours  No  results found for this basename: DDIMER,  in the last 72 hours  Cardiac Enzymes  Recent Labs Lab 08/27/13 0130 08/27/13 0659  TROPONINI <0.30 <0.30   ------------------------------------------------------------------------------------------------------------------ No components found with this basename: POCBNP,      Time Spent in minutes   35   Lala Lund K M.D on 08/27/2013 at 11:32 AM  Between 7am to 7pm - Pager - [phone]  After 7pm go to [email or web address] - password [email or web address]  And look for the night coverage person covering for me after hours  [facility]  Office  [phone]

## 2013-08-28 ENCOUNTER — Encounter (HOSPITAL_COMMUNITY)
Admission: EM | Disposition: A | Discharge status: Home / Self Care | Payer: Self-pay | Source: Home / Self Care | Attending: Internal Medicine

## 2013-08-28 ENCOUNTER — Encounter (HOSPITAL_COMMUNITY): Payer: Self-pay | Admitting: *Deleted

## 2013-08-28 ENCOUNTER — Inpatient Hospital Stay (HOSPITAL_COMMUNITY): Payer: Medicare Other

## 2013-08-28 DIAGNOSIS — R55 Syncope and collapse: Secondary | ICD-10-CM | POA: Diagnosis not present

## 2013-08-28 DIAGNOSIS — J9819 Other pulmonary collapse: Secondary | ICD-10-CM | POA: Diagnosis not present

## 2013-08-28 HISTORY — PX: LOOP RECORDER IMPLANT: SHX5477

## 2013-08-28 SURGERY — LOOP RECORDER IMPLANT
Anesthesia: LOCAL

## 2013-08-28 MED ORDER — LIDOCAINE-EPINEPHRINE 1 %-1:100000 IJ SOLN
INTRAMUSCULAR | Status: AC
  Filled 2013-08-28: qty 1

## 2013-08-28 MED ORDER — IOHEXOL 350 MG/ML SOLN
100.0000 mL | Freq: Once | INTRAVENOUS | Status: AC | PRN
  Administered 2013-08-28: 15:00:00 100 mL via INTRAVENOUS

## 2013-08-28 MED ORDER — BUPIVACAINE HCL (PF) 0.25 % IJ SOLN
INTRAMUSCULAR | Status: AC
  Filled 2013-08-28: qty 30

## 2013-08-28 MED ORDER — CHLORHEXIDINE GLUCONATE 4 % EX LIQD
60.0000 mL | Freq: Once | CUTANEOUS | Status: DC
  Filled 2013-08-28: qty 60

## 2013-08-28 NOTE — ED Provider Notes (Addendum)
I saw and evaluated the patient, reviewed the resident's note and I agree with the findings and plan.  EKG Interpretation    Date/Time:  Sunday August 26 2013 16:33:31 EST Ventricular Rate:  111 PR Interval:  138 QRS Duration: 79 QT Interval:  325 QTC Calculation: 442 R Axis:   66 Text Interpretation:  Sinus tachycardia ED PHYSICIAN INTERPRETATION AVAILABLE IN [facility] Confirmed by TEST, RECORD ([id number]) on 08/30/2013 10:59:31 AM            Pt s/p syncope event. States felt sweaty all over, took hr was 140. No cp. Fainted. Currently denies pain. No cp or sob. No headache. Spine nt.  Labs. Admit.   Mirna Mires, MD 08/28/13 1275  Mirna Mires, MD 09/24/13 1434

## 2013-08-28 NOTE — Op Note (Signed)
SURGEON:  Thompson Grayer, MD     PREPROCEDURE DIAGNOSIS:  Recurrent unexplained syncope    POSTPROCEDURE DIAGNOSIS:  Recurrent unexplained syncope     PROCEDURES:   1. Implantable loop recorder implantation    INTRODUCTION:  [name] [name] is a 67 y.o. female with a history of recurrent unexplained syncope who presents today for implantable loop implantation.   Despite an extensive workup by neurology, no reversible causes have been identified.  she has worn telemetry during which she did not have arrhythmias. She has been evaluated by both Drs Caryl Comes and Lovena Le who feel that implantable loop recorder is recommended.  The patient therefore presents today for implantable loop implantation.     DESCRIPTION OF PROCEDURE:  Informed written consent was obtained, and the patient was brought to the electrophysiology lab in a fasting state.  The patient required no sedation for the procedure today.  Mapping over the patient's chest was performed by the EP lab staff to identify the area where electrograms were most prominent for ILR recording.  This area was found to be the left parasternal region over the 3rd-4th intercostal space. The patients left chest was therefore prepped and draped in the usual sterile fashion by the EP lab staff. The skin overlying the left parasternal region was infiltrated with lidocaine for local analgesia.  A 0.5-cm incision was made over the left parasternal region over the 3rd intercostal space.  A subcutaneous ILR pocket was fashioned using a combination of sharp and blunt dissection.  A Medtronic Reveal Kalida model [id number] implantable loop recorder was then placed into the pocket  R waves were very prominent and measured 0.20m.  Steri- Strips and a sterile dressing were then applied.  There were no early apparent complications.     CONCLUSIONS:   1. Successful implantation of a Medtronic Reveal LINQ implantable loop recorder for recurrent unexplained syncope  2. No early  apparent complications.

## 2013-08-28 NOTE — Progress Notes (Signed)
Patient: [name] [name] Date of Encounter: 08/28/2013, 7:12 AM Admit date: 08/26/2013     Subjective  Ms. Veith has no new complaints.    Objective  Physical Exam: Vitals: BP 127/69  Pulse 114  Temp(Src) 98 F (36.7 C) (Oral)  Resp 20  Ht 5' 5.5" (1.664 m)  Wt 351 lb 3.1 oz (159.3 kg)  BMI 57.53 kg/m2  SpO2 92% General: Well developed, overweight 67 year old female in no acute distress. Neck: Supple. JVD not elevated. Lungs: Clear bilaterally to auscultation without wheezes, rales, or rhonchi. Breathing is unlabored. Heart: Distant heart tones. RRR S1 S2 without murmurs, rubs, or gallops.  Abdomen: Soft, non-distended. Extremities: No clubbing or cyanosis. No edema.  Distal pedal pulses are 2+ and equal bilaterally. Neuro: Alert and oriented X 3. Moves all extremities spontaneously. No focal deficits.  Intake/Output:  Intake/Output Summary (Last 24 hours) at 08/28/13 7371 Last data filed at 08/28/13 0400  Gross per 24 hour  Intake    960 ml  Output   2102 ml  Net  -1142 ml    Inpatient Medications:  . allopurinol  300 mg Oral Daily  . amitriptyline  100 mg Oral QHS  . apixaban  5 mg Oral BID  . DULoxetine  60 mg Oral Daily  . ferrous sulfate  325 mg Oral Q breakfast  . furosemide  40 mg Oral Q breakfast  . furosemide  40 mg Oral Q48H  . levothyroxine  200 mcg Oral QAC breakfast  . megestrol  40 mg Oral BID  . omega-3 acid ethyl esters  1 g Oral Daily  . potassium chloride SA  20 mEq Oral TID  . sodium chloride  3 mL Intravenous Q12H  . sodium chloride  3 mL Intravenous Q12H  . vitamin C  500 mg Oral QHS    Labs:  Recent Labs  08/26/13 1810 08/27/13 0130  NA 141 141  K 4.1 4.0  CL 102 103  CO2 28 28  GLUCOSE 91 178*  BUN 10 11  CREATININE 0.91 0.95  CALCIUM 9.5 9.3    Recent Labs  08/27/13 0130  AST 35  ALT 32  ALKPHOS 74  BILITOT 0.6  PROT 7.2  ALBUMIN 3.6    Recent Labs  08/26/13 1810 08/27/13 0130  WBC 7.4 9.5  NEUTROABS 4.5 6.2    HGB 13.6 12.7  HCT 41.2 39.1  MCV 90.9 90.9  PLT 226 214    Recent Labs  08/27/13 0130 08/27/13 0659 08/27/13 1225  TROPONINI <0.30 <0.30 <0.30    Recent Labs  08/27/13 0130  TSH 1.585    Radiology/Studies: Dg Chest 2 View  08/27/2013   CLINICAL DATA:  Syncope  EXAM: CHEST  2 VIEW  COMPARISON:  05/06/2013  FINDINGS: Cardiac enlargement without heart failure. Lungs are clear without infiltrate effusion or mass. No change from the prior study.  IMPRESSION: No active cardiopulmonary disease.   Electronically Signed   By: Franchot Gallo M.D.   On: 08/27/2013 16:15   Ct Head Wo Contrast  08/26/2013   CLINICAL DATA:  Syncope.  Headache.  EXAM: CT HEAD WITHOUT CONTRAST  TECHNIQUE: Contiguous axial images were obtained from the base of the skull through the vertex without intravenous contrast.  COMPARISON:  04/01/2012.  FINDINGS: Right frontal scalp hematoma. No skull fracture, intracranial hemorrhage or paranasal sinus air-fluid levels. Stable mildly enlarged ventricles and subarachnoid spaces.  IMPRESSION: 1. Right frontal scalp hematoma without skull fracture or intracranial hemorrhage. 2. Stable  mild atrophy.   Electronically Signed   By: Enrique Sack M.D.   On: 08/26/2013 20:06   Transesophageal echocardiogram June 2014 Left ventricle: Systolic function was normal. The estimated ejection fraction was in the range of 55% to 60%. ------------------------------------------------------------ Aortic valve: Structurally normal valve. Cusp separation was normal. No evidence of vegetation. No evidence of vegetation. Doppler: No regurgitation. ------------------------------------------------------------ Aorta: The aorta was normal, not dilated, and non-diseased. ------------------------------------------------------------ Mitral valve: Doppler: Trivial regurgitation. ------------------------------------------------------------ Left atrium: The atrium was normal in size. No evidence  of thrombus in the atrial cavity or appendage. ------------------------------------------------------------ Atrial septum: There was a PFO by color doppler and bubble contrast demonstrating right to left flow across the atrial septum. There was a patent foramen ovale. ------------------------------------------------------------ Pulmonic valve: Poorly visualized. ------------------------------------------------------------ Tricuspid valve: Doppler: Mild-moderate regurgitation. ------------------------------------------------------------ Ascending Aorta: The aorta was normal, not dilated, and non-diseased.  Telemetry: sinus tachycardia   Assessment and Plan  1. Syncope  - suspect neurally mediated syncope - for ILR implant today 2. Persistent sinus tachycardia 3. Paroxysmal atrial flutter  4. CAD  5. COPD  6. HTN  Dr. Lovena Le to see Signed, Jacqualine Mau  EP Attending  Patient seen and examined. Agree with above. Insertion of an ILR is reasonable way to follow her at this point. Will plan to do today, hopefully early and she can go home approx. 2 hours after.  Mikle Bosworth.D.

## 2013-08-28 NOTE — Evaluation (Signed)
Physical Therapy Evaluation Patient Details Name: [name] [name] MRN: [id number] DOB: March 04, 1946 Today's Date: 08/28/2013 Time: 1275-1700 PT Time Calculation (min): 22 min  PT Assessment / Plan / Recommendation History of Present Illness  pt presents with hx of syncopal episodes.    Clinical Impression  Pt agreeable to mobility, but limited by vaginal bleeding.  RN aware.  Pt lives alone and indicates her neighbors can check on her.  Pt would benefit from HHPT for home safety eval.  Will continue to follow.      PT Assessment  Patient needs continued PT services    Follow Up Recommendations  Home health PT;Supervision - Intermittent    Does the patient have the potential to tolerate intense rehabilitation      Barriers to Discharge        Equipment Recommendations  None recommended by PT    Recommendations for Other Services OT consult   Frequency Min 3X/week    Precautions / Restrictions Precautions Precautions: Fall Restrictions Weight Bearing Restrictions: No   Pertinent Vitals/Pain Denied pain.        Mobility  Bed Mobility Bed Mobility: Sitting - Scoot to Edge of Bed Sitting - Scoot to Edge of Bed: 5: Supervision Details for Bed Mobility Assistance: pt long sitting in bed on arrival.  pt able to scoot to EOB without A.   Transfers Transfers: Sit to Stand;Stand to Sit Sit to Stand: 4: Min guard;With upper extremity assist;From bed Stand to Sit: 4: Min guard;With upper extremity assist;To chair/3-in-1 Details for Transfer Assistance: demos good use of UEs.  cues to control descent to sitting.   Ambulation/Gait Ambulation/Gait Assistance: 4: Min guard Ambulation Distance (Feet): 60 Feet Assistive device: Rolling walker Ambulation/Gait Assistance Details: pt ambulated laps in her room 2/2 vaginal bleeding and wanting to avoid being in hallway.  pt with moderate lateral sway 2/2 body habitus, but no LOB.   Gait Pattern: Step-through pattern;Decreased stride  length;Wide base of support;Lateral trunk lean to right;Lateral trunk lean to left Stairs: No Wheelchair Mobility Wheelchair Mobility: No    Exercises     PT Diagnosis: Difficulty walking  PT Problem List: Decreased activity tolerance;Decreased balance;Decreased mobility;Decreased knowledge of use of DME;Obesity PT Treatment Interventions: DME instruction;Stair training;Gait training;Functional mobility training;Therapeutic activities;Therapeutic exercise;Balance training;Patient/family education     PT Goals(Current goals can be found in the care plan section) Acute Rehab PT Goals Patient Stated Goal: Home PT Goal Formulation: With patient Time For Goal Achievement: 09/04/13 Potential to Achieve Goals: Good  Visit Information  Last PT Received On: 08/28/13 Assistance Needed: +1 History of Present Illness: pt presents with hx of syncopal episodes.         Prior [place] expects to be discharged to:: Private residence Living Arrangements: Alone Available Help at Discharge: Friend(s);Available PRN/intermittently Type of Home: House Home Access: Stairs to enter [employer] of Steps: 1 Entrance Stairs-Rails: None Home Layout: One level Home Equipment: Walker - 2 wheels;Walker - 4 wheels Prior Function Level of Independence: Independent Comments: pt indicates she is starting to look for someone to help her clean.   Communication Communication: No difficulties    Cognition  Cognition Arousal/Alertness: Awake/alert Behavior During Therapy: WFL for tasks assessed/performed Overall Cognitive Status: Within Functional Limits for tasks assessed    Extremity/Trunk Assessment Upper Extremity Assessment Upper Extremity Assessment: Overall WFL for tasks assessed Lower Extremity Assessment Lower Extremity Assessment: Overall WFL for tasks assessed   Balance Balance Balance Assessed: No  End of Session PT -  End of Session Equipment  Utilized During Treatment: Gait belt;Oxygen Activity Tolerance: Patient tolerated treatment well Patient left: in chair;with call bell/phone within reach Nurse Communication: Mobility status  GP     Catarina Hartshorn, [place] 08/28/2013, 10:40 AM

## 2013-08-28 NOTE — Interval H&P Note (Signed)
History and Physical Interval Note:  08/28/2013 11:15 AM  [name] [name]  has presented today for surgery, with the diagnosis of snycope  The various methods of treatment have been discussed with the patient and family. After consideration of risks, benefits and other options for treatment, the patient has consented to  Procedure(s): LOOP RECORDER IMPLANT (N/A) as a surgical intervention .  The patient's history has been reviewed, patient examined, no change in status, stable for surgery.  I have reviewed the patient's chart and labs.  Questions were answered to the patient's satisfaction.     Thompson Grayer

## 2013-08-28 NOTE — Progress Notes (Signed)
Patient is on cpap full face mask with 2lpm 02 bleed in. Patient is tolerating cpap well at this time.

## 2013-08-28 NOTE — H&P (View-Only) (Signed)
Patient: [name] [name] Date of Encounter: 08/28/2013, 7:12 AM Admit date: 08/26/2013     Subjective  Ms. Salle has no new complaints.    Objective  Physical Exam: Vitals: BP 127/69  Pulse 114  Temp(Src) 98 F (36.7 C) (Oral)  Resp 20  Ht 5' 5.5" (1.664 m)  Wt 351 lb 3.1 oz (159.3 kg)  BMI 57.53 kg/m2  SpO2 92% General: Well developed, overweight 67 year old female in no acute distress. Neck: Supple. JVD not elevated. Lungs: Clear bilaterally to auscultation without wheezes, rales, or rhonchi. Breathing is unlabored. Heart: Distant heart tones. RRR S1 S2 without murmurs, rubs, or gallops.  Abdomen: Soft, non-distended. Extremities: No clubbing or cyanosis. No edema.  Distal pedal pulses are 2+ and equal bilaterally. Neuro: Alert and oriented X 3. Moves all extremities spontaneously. No focal deficits.  Intake/Output:  Intake/Output Summary (Last 24 hours) at 08/28/13 1448 Last data filed at 08/28/13 0400  Gross per 24 hour  Intake    960 ml  Output   2102 ml  Net  -1142 ml    Inpatient Medications:  . allopurinol  300 mg Oral Daily  . amitriptyline  100 mg Oral QHS  . apixaban  5 mg Oral BID  . DULoxetine  60 mg Oral Daily  . ferrous sulfate  325 mg Oral Q breakfast  . furosemide  40 mg Oral Q breakfast  . furosemide  40 mg Oral Q48H  . levothyroxine  200 mcg Oral QAC breakfast  . megestrol  40 mg Oral BID  . omega-3 acid ethyl esters  1 g Oral Daily  . potassium chloride SA  20 mEq Oral TID  . sodium chloride  3 mL Intravenous Q12H  . sodium chloride  3 mL Intravenous Q12H  . vitamin C  500 mg Oral QHS    Labs:  Recent Labs  08/26/13 1810 08/27/13 0130  NA 141 141  K 4.1 4.0  CL 102 103  CO2 28 28  GLUCOSE 91 178*  BUN 10 11  CREATININE 0.91 0.95  CALCIUM 9.5 9.3    Recent Labs  08/27/13 0130  AST 35  ALT 32  ALKPHOS 74  BILITOT 0.6  PROT 7.2  ALBUMIN 3.6    Recent Labs  08/26/13 1810 08/27/13 0130  WBC 7.4 9.5  NEUTROABS 4.5 6.2    HGB 13.6 12.7  HCT 41.2 39.1  MCV 90.9 90.9  PLT 226 214    Recent Labs  08/27/13 0130 08/27/13 0659 08/27/13 1225  TROPONINI <0.30 <0.30 <0.30    Recent Labs  08/27/13 0130  TSH 1.585    Radiology/Studies: Dg Chest 2 View  08/27/2013   CLINICAL DATA:  Syncope  EXAM: CHEST  2 VIEW  COMPARISON:  05/06/2013  FINDINGS: Cardiac enlargement without heart failure. Lungs are clear without infiltrate effusion or mass. No change from the prior study.  IMPRESSION: No active cardiopulmonary disease.   Electronically Signed   By: Franchot Gallo M.D.   On: 08/27/2013 16:15   Ct Head Wo Contrast  08/26/2013   CLINICAL DATA:  Syncope.  Headache.  EXAM: CT HEAD WITHOUT CONTRAST  TECHNIQUE: Contiguous axial images were obtained from the base of the skull through the vertex without intravenous contrast.  COMPARISON:  04/01/2012.  FINDINGS: Right frontal scalp hematoma. No skull fracture, intracranial hemorrhage or paranasal sinus air-fluid levels. Stable mildly enlarged ventricles and subarachnoid spaces.  IMPRESSION: 1. Right frontal scalp hematoma without skull fracture or intracranial hemorrhage. 2. Stable  mild atrophy.   Electronically Signed   By: Enrique Sack M.D.   On: 08/26/2013 20:06   Transesophageal echocardiogram June 2014 Left ventricle: Systolic function was normal. The estimated ejection fraction was in the range of 55% to 60%. ------------------------------------------------------------ Aortic valve: Structurally normal valve. Cusp separation was normal. No evidence of vegetation. No evidence of vegetation. Doppler: No regurgitation. ------------------------------------------------------------ Aorta: The aorta was normal, not dilated, and non-diseased. ------------------------------------------------------------ Mitral valve: Doppler: Trivial regurgitation. ------------------------------------------------------------ Left atrium: The atrium was normal in size. No evidence  of thrombus in the atrial cavity or appendage. ------------------------------------------------------------ Atrial septum: There was a PFO by color doppler and bubble contrast demonstrating right to left flow across the atrial septum. There was a patent foramen ovale. ------------------------------------------------------------ Pulmonic valve: Poorly visualized. ------------------------------------------------------------ Tricuspid valve: Doppler: Mild-moderate regurgitation. ------------------------------------------------------------ Ascending Aorta: The aorta was normal, not dilated, and non-diseased.  Telemetry: sinus tachycardia   Assessment and Plan  1. Syncope  - suspect neurally mediated syncope - for ILR implant today 2. Persistent sinus tachycardia 3. Paroxysmal atrial flutter  4. CAD  5. COPD  6. HTN  Dr. Lovena Le to see Signed, Jacqualine Mau  EP Attending  Patient seen and examined. Agree with above. Insertion of an ILR is reasonable way to follow her at this point. Will plan to do today, hopefully early and she can go home approx. 2 hours after.  Mikle Bosworth.D.

## 2013-08-28 NOTE — Progress Notes (Signed)
[facility]                                                                                Patient Demographics  [name] [name], is a 67 y.o. female, DOB - [id number], RCV:[id number]  Admit date - 08/26/2013   Admitting Physician Rise Patience, MD  Outpatient Primary MD for the patient is [name], MD  LOS - 2   Chief Complaint  Patient presents with  . Loss of Consciousness        Assessment & Plan    1.Syncope with H/O ? Sick Sinus - cardiology following the patient, await EP evaluation, not on any rate controlling agents so far. Monitor on telemetry. Symptom-free for now.     2. History of atrial flutter. Goal will be rate control, she is slightly tachycardic. Again will defer to cardiology on any rate controlling agents in in the light of previous episodes of bradycardia with rate controlling agents. On Eliquis which will be continued.   D/W Cards Dr Lovena Le - CTA today as tachycardic, loop recorder later, if stable DC in am.     3. Hypothyroidism. TSH stable this admission continue home dose Synthroid.     4.OSA - CPAP continue tonight.     5. Fall with right frontal hematoma. Stable. CT scan stable.     6. History of COPD no acute issues. No wheezing on exam. Symptom-free.     7. Chronic diastolic CHF. No echo in the chart. Presently compensated continue home dose Lasix.     Code Status: Full  Family Communication:   Disposition Plan: Home   Procedures     Consults Cards   Medications  Scheduled Meds: . allopurinol  300 mg Oral Daily  . amitriptyline  100 mg Oral QHS  . apixaban  5 mg Oral BID  . DULoxetine  60 mg Oral Daily  . ferrous sulfate  325 mg Oral Q breakfast  . furosemide  40 mg Oral Q breakfast  . furosemide  40 mg Oral Q48H  . levothyroxine  200 mcg Oral QAC breakfast  . megestrol  40 mg Oral BID  . omega-3 acid ethyl esters  1 g Oral Daily  . potassium chloride SA  20 mEq Oral TID  . sodium chloride  3  mL Intravenous Q12H  . sodium chloride  3 mL Intravenous Q12H  . vitamin C  500 mg Oral QHS   Continuous Infusions:  PRN Meds:.acetaminophen, acetaminophen, ondansetron (ZOFRAN) IV, ondansetron  DVT Prophylaxis Eliquis  Lab Results  Component Value Date   PLT 214 08/27/2013    Antibiotics     Anti-infectives   None          Subjective:   [name] [name] today has, No headache, No chest pain, No abdominal pain - No Nausea, No new weakness tingling or numbness, No Cough - SOB.    Objective:   Filed Vitals:   08/28/13 0441 08/28/13 0443 08/28/13 0445 08/28/13 1401  BP: 118/54 135/68 127/69 133/95  Pulse: 108 101 114 107  Temp: 98 F (36.7 C)   97.8 F (36.6 C)  TempSrc: Oral   Oral  Resp: 20   18  Height:      Weight:   159.3 kg (351 lb 3.1 oz)   SpO2: 92%   100%    Wt Readings from Last 3 Encounters:  08/28/13 159.3 kg (351 lb 3.1 oz)  08/28/13 159.3 kg (351 lb 3.1 oz)  07/25/13 156.4 kg (344 lb 12.8 oz)     Intake/Output Summary (Last 24 hours) at 08/28/13 1543 Last data filed at 08/28/13 1401  Gross per 24 hour  Intake    960 ml  Output   1201 ml  Net   -241 ml    Exam Awake Alert, Oriented X 3, No new F.N deficits, Normal affect [place].AT,PERRAL Supple Neck,No JVD, No cervical lymphadenopathy appriciated.  Symmetrical Chest wall movement, Good air movement bilaterally, CTAB RRR,No Gallops,Rubs or new Murmurs, No Parasternal Heave +ve B.Sounds, Abd Soft, Non tender, No organomegaly appriciated, No rebound - guarding or rigidity. No Cyanosis, Clubbing or edema, No new Rash or bruise , R frontal bruise.   Data Review   Micro Results Recent Results (from the past 240 hour(s))  URINE CULTURE     Status: None   Collection Time    08/26/13  5:59 PM      Result Value Range Status   Specimen Description URINE, RANDOM   Final   Special Requests NONE   Final   Culture  Setup Time     Final   Value: 08/27/2013 00:34     Performed at [facility] PENDING   Incomplete   Culture     Final   Value: Culture reincubated for better growth     Performed at [facility]   Report Status PENDING   Incomplete    Radiology Reports Ct Head Wo Contrast  08/26/2013   CLINICAL DATA:  Syncope.  Headache.  EXAM: CT HEAD WITHOUT CONTRAST  TECHNIQUE: Contiguous axial images were obtained from the base of the skull through the vertex without intravenous contrast.  COMPARISON:  04/01/2012.  FINDINGS: Right frontal scalp hematoma. No skull fracture, intracranial hemorrhage or paranasal sinus air-fluid levels. Stable mildly enlarged ventricles and subarachnoid spaces.  IMPRESSION: 1. Right frontal scalp hematoma without skull fracture or intracranial hemorrhage. 2. Stable mild atrophy.   Electronically Signed   By: Enrique Sack M.D.   On: 08/26/2013 20:06    CBC  Recent Labs Lab 08/26/13 1810 08/27/13 0130  WBC 7.4 9.5  HGB 13.6 12.7  HCT 41.2 39.1  PLT 226 214  MCV 90.9 90.9  MCH 30.0 29.5  MCHC 33.0 32.5  RDW 14.9 14.9  LYMPHSABS 2.3 2.6  MONOABS 0.4 0.5  EOSABS 0.2 0.2  BASOSABS 0.1 0.0    Chemistries   Recent Labs Lab 08/26/13 1810 08/27/13 0130  NA 141 141  K 4.1 4.0  CL 102 103  CO2 28 28  GLUCOSE 91 178*  BUN 10 11  CREATININE 0.91 0.95  CALCIUM 9.5 9.3  AST  --  35  ALT  --  32  ALKPHOS  --  74  BILITOT  --  0.6   ------------------------------------------------------------------------------------------------------------------ estimated creatinine clearance is 89.4 ml/min (by C-G formula based on Cr of 0.95). ------------------------------------------------------------------------------------------------------------------ No results found for this basename: HGBA1C,  in the last 72 hours ------------------------------------------------------------------------------------------------------------------ No results found for this basename: CHOL, HDL, LDLCALC, TRIG, CHOLHDL, LDLDIRECT,  in the last 72  hours ------------------------------------------------------------------------------------------------------------------  Recent Labs  08/27/13 0130  TSH 1.585   ------------------------------------------------------------------------------------------------------------------ No results found for this basename: VITAMINB12, FOLATE,  FERRITIN, TIBC, IRON, RETICCTPCT,  in the last 72 hours  Coagulation profile No results found for this basename: INR, PROTIME,  in the last 168 hours  No results found for this basename: DDIMER,  in the last 72 hours  Cardiac Enzymes  Recent Labs Lab 08/27/13 0130 08/27/13 0659 08/27/13 1225  TROPONINI <0.30 <0.30 <0.30   ------------------------------------------------------------------------------------------------------------------ No components found with this basename: POCBNP,      Time Spent in minutes   35   Keevon Henney K M.D on 08/28/2013 at 3:43 PM  Between 7am to 7pm - Pager - [phone]  After 7pm go to [email or web address] - password [email or web address]  And look for the night coverage person covering for me after hours  [facility] Office  [phone]

## 2013-08-29 DIAGNOSIS — R55 Syncope and collapse: Secondary | ICD-10-CM | POA: Diagnosis not present

## 2013-08-29 LAB — URINE CULTURE: Colony Count: >=100000

## 2013-08-29 MED ORDER — DEXTROSE 5 % IV SOLN
1.0000 g | INTRAVENOUS | Status: DC
  Administered 2013-08-29: 10:00:00 1 g via INTRAVENOUS
  Filled 2013-08-29: qty 10

## 2013-08-29 MED ORDER — METOPROLOL TARTRATE 25 MG PO TABS: 25.0000 mg | ORAL_TABLET | Freq: Two times a day (BID) | ORAL | Status: DC

## 2013-08-29 MED ORDER — CEFUROXIME AXETIL 500 MG PO TABS: 500.0000 mg | ORAL_TABLET | Freq: Two times a day (BID) | ORAL | Status: DC

## 2013-08-29 NOTE — Discharge Summary (Signed)
[facility]                                                                                   Heike Pounds, is a 67 y.o. female  DOB 12/12/1945  MRN [id number].  Admission date:  08/26/2013  Admitting Physician  Rise Patience, MD  Discharge Date:  08/29/2013   Primary MD  [name], MD  Recommendations for primary care physician for things to follow:   Follow clinically. Monitor final urine culture results   Admission Diagnosis  Unspecified hypothyroidism [244.9] Atrial flutter [427.32] Syncope [780.2] Obesity hypoventilation syndrome [278.03]  Discharge Diagnosis   Syncope - vasovagal vs Cardiac  Principal Problem:   Syncope Active Problems:   HYPOTHYROIDISM   HYPERTENSION   OBSTRUCTIVE SLEEP APNEA   Atrial flutter      Past Medical History  Diagnosis Date  . Hypertension   . Hyperlipidemia   . Anemia   . CAD (coronary artery disease)     a. Nonobstructive CAD 2007 (EF 75%.  RCA  proximal 75% tubular, 25% prox LAD, 25% d1, 50% D2) at [facility]. b. NSTEMI 01/2010 in setting of respiratory failure, medical approach (not a good candidate for noninvasive eval)  . OSA on CPAP   . Hypothyroidism   . Peripheral edema 06/13/2011  . Asthma 06/13/2011  . Depression 06/13/2011  . Neuropathy 06/13/2011  . Cervical radiculopathy 06/13/2011  . Gout 06/13/2011  . Obesity hypoventilation syndrome 06/13/2011  . COPD (chronic obstructive pulmonary disease)     on O2  . Breast cancer dx'd 2005    left  . Cellulitis     hx of cellulitis in left leg  . Impaired glucose tolerance 11/01/2012  . PFO (patent foramen ovale)   . Atrial flutter   . Syncope     Past Surgical History  Procedure Laterality Date  . Ovarian cyst removal    . Breast lumpectomy    . Cervical disc surgery      have cadaver bones,, screws, and plates   . Cervical biopsy  June 2013    at [facility] for Heavy  Bleeding  . Dilation and curettage of uterus    . Colonoscopy    . Laparoscopic appendectomy   03/29/2012    Procedure: APPENDECTOMY LAPAROSCOPIC;  Surgeon: Adin Hector, MD;  Location: [facility];  Service: General;  Laterality: N/A;  . Tee without cardioversion N/A 03/15/2013    Procedure: TRANSESOPHAGEAL ECHOCARDIOGRAM (TEE);  Surgeon: Thayer Headings, MD;  Location: [place];  Service: Cardiovascular;  Laterality: N/A;  . Cardioversion N/A 03/15/2013    Procedure: CARDIOVERSION;  Surgeon: Thayer Headings, MD;  Location: [facility] ENDOSCOPY;  Service: Cardiovascular;  Laterality: N/A;  . Loop recorder implant  08-28-2013    MDT LinQ implanted by Dr Rayann Heman for syncope     Discharge Condition: stable   Follow-up Information   Follow up with [name], MD. Schedule an appointment as soon as possible for a visit in 1 week. (ask for OB referral for Hystrectomy)    Specialties:  Internal Medicine, Radiology   Contact information:   [place] [address] [phone]  Follow up with Thompson Grayer, MD. Schedule an appointment as soon as possible for a visit in 1 week.   Specialty:  Cardiology   Contact information:   [place] [address] [phone]         Consults obtained - Cardiology   Discharge Medications      Medication List    STOP taking these medications       amLODipine 2.5 MG tablet  Commonly known as:  NORVASC      TAKE these medications       albuterol 108 (90 BASE) MCG/ACT inhaler  Commonly known as:  PROVENTIL HFA;VENTOLIN HFA  Inhale 2 puffs into the lungs every 6 (six) hours as needed for wheezing.     allopurinol 300 MG tablet  Commonly known as:  ZYLOPRIM  Take 1 tablet (300 mg total) by mouth daily.     amitriptyline 100 MG tablet  Commonly known as:  ELAVIL  TAKE 1 TABLET AT BEDTIME     apixaban 5 MG Tabs tablet  Commonly known as:  ELIQUIS  Take 1 tablet (5 mg total) by mouth 2 (two) times daily.     cefUROXime 500 MG tablet  Commonly known as:  CEFTIN  Take 1 tablet (500 mg  total) by mouth 2 (two) times daily with a meal.     DULoxetine 30 MG capsule  Commonly known as:  CYMBALTA  Take 60 mg by mouth daily.     ferrous sulfate 325 (65 FE) MG tablet  Take 325 mg by mouth daily with breakfast.     FISH OIL PO  Take 1 capsule by mouth at bedtime.     furosemide 40 MG tablet  Commonly known as:  LASIX  Take 40 mg by mouth See admin instructions. Take 1 tablet every morning then 1 tablet every other day at night     levothyroxine 200 MCG tablet  Commonly known as:  SYNTHROID, LEVOTHROID  Take 1 tablet (200 mcg total) by mouth daily.     megestrol 40 MG tablet  Commonly known as:  MEGACE  Take 1 tablet (40 mg total) by mouth 2 (two) times daily.     metoprolol tartrate 25 MG tablet  Commonly known as:  LOPRESSOR  Take 1 tablet (25 mg total) by mouth 2 (two) times daily.     potassium chloride SA 20 MEQ tablet  Commonly known as:  K-DUR,KLOR-CON  Take 1 tablet (20 mEq total) by mouth 3 (three) times daily.     vitamin C 500 MG tablet  Commonly known as:  ASCORBIC ACID  Take 500 mg by mouth at bedtime.     Vitamin D 2000 UNITS tablet  Take 2,000 Units by mouth at bedtime.         Diet and Activity recommendation: See Discharge Instructions below   Discharge Instructions     Follow with Primary MD [name], MD in 7 days   Get CBC, CMP, checked 7 days by Primary MD and again as instructed by your Primary MD. Get a 2 view Chest X ray done next visit.  Get Medicines reviewed and adjusted.  Please request your Prim.MD to go over all Hospital Tests and Procedure/Radiological results at the follow up, please get all Hospital records sent to your Prim MD by signing hospital release before you go home.  Activity: As tolerated with Full fall precautions use walker/cane & assistance as needed   Diet:  Heart healthy  For Heart failure patients - Check your Weight same time everyday, if you gain over 2 pounds, or you develop in leg swelling,  experience more shortness of breath or chest pain, call your Primary MD immediately. Follow Cardiac Low Salt Diet and 1.8 lit/day fluid restriction.  Disposition Home   If you experience worsening of your admission symptoms, develop shortness of breath, life threatening emergency, suicidal or homicidal thoughts you must seek medical attention immediately by calling 911 or calling your MD immediately  if symptoms less severe.  You Must read complete instructions/literature along with all the possible adverse reactions/side effects for all the Medicines you take and that have been prescribed to you. Take any new Medicines after you have completely understood and accpet all the possible adverse reactions/side effects.   Do not drive and provide baby sitting services if your were admitted for syncope or siezures until you have seen by Primary MD or a Neurologist and advised to do so again.  Do not drive when taking Pain medications.    Do not take more than prescribed Pain, Sleep and Anxiety Medications  Special Instructions: If you have smoked or chewed Tobacco  in the last 2 yrs please stop smoking, stop any regular Alcohol  and or any Recreational drug use.  Wear Seat belts while driving.   Please note  You were cared for by a hospitalist during your hospital stay. If you have any questions about your discharge medications or the care you received while you were in the hospital after you are discharged, you can call the unit and asked to speak with the hospitalist on call if the hospitalist that took care of you is not available. Once you are discharged, your primary care physician will handle any further medical issues. Please note that NO REFILLS for any discharge medications will be authorized once you are discharged, as it is imperative that you return to your primary care physician (or establish a relationship with a primary care physician if you do not have one) for your aftercare needs so  that they can reassess your need for medications and monitor your lab values.  Major procedures and Radiology Reports - PLEASE review detailed and final reports for all details, in brief -    Implantable loop recorder placement by Dr. Rayann Heman on 08/28/2013   Dg Chest 2 View  08/27/2013   CLINICAL DATA:  Syncope  EXAM: CHEST  2 VIEW  COMPARISON:  05/06/2013  FINDINGS: Cardiac enlargement without heart failure. Lungs are clear without infiltrate effusion or mass. No change from the prior study.  IMPRESSION: No active cardiopulmonary disease.   Electronically Signed   By: Franchot Gallo M.D.   On: 08/27/2013 16:15   Ct Head Wo Contrast  08/26/2013   CLINICAL DATA:  Syncope.  Headache.  EXAM: CT HEAD WITHOUT CONTRAST  TECHNIQUE: Contiguous axial images were obtained from the base of the skull through the vertex without intravenous contrast.  COMPARISON:  04/01/2012.  FINDINGS: Right frontal scalp hematoma. No skull fracture, intracranial hemorrhage or paranasal sinus air-fluid levels. Stable mildly enlarged ventricles and subarachnoid spaces.  IMPRESSION: 1. Right frontal scalp hematoma without skull fracture or intracranial hemorrhage. 2. Stable mild atrophy.   Electronically Signed   By: Enrique Sack M.D.   On: 08/26/2013 20:06   Ct Angio Chest Pe W/cm &/or Wo Cm  08/28/2013   CLINICAL DATA:  Shortness of breath and palpitations.  EXAM: CT ANGIOGRAPHY CHEST WITH CONTRAST  TECHNIQUE: Multidetector CT imaging  of the chest was performed using the standard protocol during bolus administration of intravenous contrast. Multiplanar CT image reconstructions including MIPs were obtained to evaluate the vascular anatomy. The study quality is degraded due to the patient's body habitus in contact of the body with the scanner gantry  CONTRAST:  151m OMNIPAQUE IOHEXOL 350 MG/ML SOLN  COMPARISON:  Chest x-ray dated August 27, 2013.  FINDINGS: Contrast within the pulmonary arterial tree is normal in appearance. There  are no filling defects to suggest an acute pulmonary embolism. The caliber of the thoracic aorta is normal. There is no evidence of a false aortic lumen. The cardiac chambers are mildly enlarged. There is a small hiatal hernia. The thoracic esophagus appears to be of normal caliber. No bulky mediastinal or hilar lymph nodes are evident. There is no pleural nor pericardial effusion.  At lung windows settings there is no alveolar infiltrate. There is minimal compressive atelectasis posteriorly in the lower lobes bilaterally. The thoracic vertebral bodies are preserved in height. The sternum appears intact. The observed portions of the ribs appear normal.  Within the upper abdomen the observed portions of the liver and spleen appear normal.  IMPRESSION:  1. There is no evidence of an acute pulmonary embolism nor acute thoracic aortic pathology. 2. There is enlargement of the cardiac chambers without overt evidence of pulmonary edema. 3. Is minimal compressive atelectasis posteriorly in both lungs. 4. The mediastinum demonstrates no lymphadenopathy. There is a small hiatal hernia.   Electronically Signed   By: David  JMartinique  On: 08/28/2013 15:28    Micro Results      Recent Results (from the past 240 hour(s))  URINE CULTURE     Status: None   Collection Time    08/26/13  5:59 PM      Result Value Range Status   Specimen Description URINE, RANDOM   Final   Special Requests NONE   Final   Culture  Setup Time     Final   Value: 08/27/2013 00:34     Performed at [email or web address]     Final   Value: >=100,000 COLONIES/ML     Performed at [facility]  Culture     Final   Value: [place]    Performed at [facility]  Report Status PENDING   Incomplete     History of present illness and  Hospital Course:     Kindly see H&P for history of present illness and admission details, please review complete Labs, Consult reports and Test reports for all  details in brief [name] [name] is a 67y.o. female, patient with history of CAD, atrial flutter, sick sinus syndrome, hypothyroidism, hypertension, COPD, OSA had a brief episode of loss of consciousness at her house, her symptoms were suggestive of some emotional excitement causing a flushing sensation and tachycardia after which he passed out, she had both symptoms of vasovagal versus cardiogenic syncope.   She was kept her on telemetry and besides sinus tachycardia she had no dysrhythmia, she was seen by cardiology and underwent implantable loop recorder placement by Dr. ARayann Heman she also underwent CT angiogram of the chest and PE on any acute findings were ruled out. She is now asymptomatic and bleeding in the hallway without any dizziness, she is not orthostatic and will be discharged home on low-dose Lopressor instead of Norvasc. She will follow with cardiology in the outpatient setting post discharge.   And  also had a mild UTI for which she will be placed on Ceftin. We'll request PCP to kindly monitor her final urine culture results.    She has history of atrial flutter, she has been on Eliquis which will be continued, low-dose beta blocker has been added.   She has history of hypothyroidism TSH was stable home dose Synthroid will be continued    Continue nighttime CPAP for her underlying history of obstructive sleep apnea    She has small right frontal hematoma from the fall due to syncope at home which is stable, CT head unremarkable except for hematoma.    Underlying history of mild COPD and chronic diastolic CHF with EF of around 60% all stable she had no acute issues from thees standpoint this visit.       Today   Subjective:   [name] today has no headache,no chest abdominal pain,no new weakness tingling or numbness, feels much better wants to go home today.    Objective:   Blood pressure 120/70, pulse 107, temperature 98 F (36.7 C), temperature source Oral,  resp. rate 18, height 5' 5.5" (1.664 m), weight 159.3 kg (351 lb 3.1 oz), SpO2 91.00%.   Intake/Output Summary (Last 24 hours) at 08/29/13 1018 Last data filed at 08/29/13 0832  Gross per 24 hour  Intake    840 ml  Output    951 ml  Net   -111 ml    Exam Awake Alert, Oriented *3, No new F.N deficits, Normal affect [place].AT,PERRAL Supple Neck,No JVD, No cervical lymphadenopathy appriciated.  Symmetrical Chest wall movement, Good air movement bilaterally, CTAB RRR,No Gallops,Rubs or new Murmurs, No Parasternal Heave +ve B.Sounds, Abd Soft, Non tender, No organomegaly appriciated, No rebound -guarding or rigidity. No Cyanosis, Clubbing or edema, No new Rash or bruise  Data Review   CBC w Diff: Lab Results  Component Value Date   WBC 9.5 08/27/2013   WBC 6.4 06/28/2013   HGB 12.7 08/27/2013   HGB 12.6 06/28/2013   HCT 39.1 08/27/2013   HCT 38.7 06/28/2013   PLT 214 08/27/2013   PLT 188 06/28/2013   LYMPHOPCT 27 08/27/2013   LYMPHOPCT 34.9 06/28/2013   MONOPCT 6 08/27/2013   MONOPCT 6.7 06/28/2013   EOSPCT 2 08/27/2013   EOSPCT 4.6 06/28/2013   BASOPCT 0 08/27/2013   BASOPCT 1.3 06/28/2013    CMP: Lab Results  Component Value Date   NA 141 08/27/2013   NA 143 06/28/2013   K 4.0 08/27/2013   K 3.9 06/28/2013   CL 103 08/27/2013   CL 97* 12/28/2012   CO2 28 08/27/2013   CO2 25 06/28/2013   BUN 11 08/27/2013   BUN 8.7 06/28/2013   CREATININE 0.95 08/27/2013   CREATININE 1.0 06/28/2013   PROT 7.2 08/27/2013   PROT 8.0 06/28/2013   ALBUMIN 3.6 08/27/2013   ALBUMIN 3.7 06/28/2013   BILITOT 0.6 08/27/2013   BILITOT 0.34 06/28/2013   ALKPHOS 74 08/27/2013   ALKPHOS 96 06/28/2013   AST 35 08/27/2013   AST 47* 06/28/2013   ALT 32 08/27/2013   ALT 46 06/28/2013  .   Total Time in preparing paper work, data evaluation and todays exam - 35 minutes  Thurnell Lose M.D on 08/29/2013 at 10:18 AM  [place]  [phone]

## 2013-08-29 NOTE — Progress Notes (Signed)
PT Cancellation Note  Patient Details Name: [name] [name] MRN: [id number] DOB: 1946/06/14   Cancelled Treatment:    Reason Eval/Treat Not Completed:  (Refused secondary to d/c home).   INGOLD,Annete Ayuso 08/29/2013, 10:27 AM [facility] [phone] (pager)

## 2013-08-30 ENCOUNTER — Encounter (HOSPITAL_COMMUNITY): Payer: Self-pay | Admitting: Emergency Medicine

## 2013-08-30 ENCOUNTER — Telehealth: Payer: Self-pay | Admitting: Cardiology

## 2013-08-30 ENCOUNTER — Inpatient Hospital Stay (HOSPITAL_COMMUNITY)
Admission: EM | Admit: 2013-08-30 | Discharge: 2013-09-01 | DRG: 243 | Disposition: A | Discharge status: Home / Self Care | Payer: Medicare Other | Attending: Cardiology | Admitting: Cardiology

## 2013-08-30 ENCOUNTER — Emergency Department (HOSPITAL_COMMUNITY): Payer: Medicare Other

## 2013-08-30 DIAGNOSIS — Z833 Family history of diabetes mellitus: Secondary | ICD-10-CM

## 2013-08-30 DIAGNOSIS — Z9981 Dependence on supplemental oxygen: Secondary | ICD-10-CM

## 2013-08-30 DIAGNOSIS — N950 Postmenopausal bleeding: Secondary | ICD-10-CM

## 2013-08-30 DIAGNOSIS — I495 Sick sinus syndrome: Secondary | ICD-10-CM

## 2013-08-30 DIAGNOSIS — I4891 Unspecified atrial fibrillation: Secondary | ICD-10-CM | POA: Diagnosis not present

## 2013-08-30 DIAGNOSIS — E039 Hypothyroidism, unspecified: Secondary | ICD-10-CM | POA: Diagnosis present

## 2013-08-30 DIAGNOSIS — G4733 Obstructive sleep apnea (adult) (pediatric): Secondary | ICD-10-CM | POA: Diagnosis present

## 2013-08-30 DIAGNOSIS — N390 Urinary tract infection, site not specified: Secondary | ICD-10-CM | POA: Diagnosis present

## 2013-08-30 DIAGNOSIS — F3289 Other specified depressive episodes: Secondary | ICD-10-CM | POA: Diagnosis present

## 2013-08-30 DIAGNOSIS — R05 Cough: Secondary | ICD-10-CM

## 2013-08-30 DIAGNOSIS — Z0000 Encounter for general adult medical examination without abnormal findings: Secondary | ICD-10-CM

## 2013-08-30 DIAGNOSIS — E662 Morbid (severe) obesity with alveolar hypoventilation: Secondary | ICD-10-CM

## 2013-08-30 DIAGNOSIS — I10 Essential (primary) hypertension: Secondary | ICD-10-CM | POA: Diagnosis present

## 2013-08-30 DIAGNOSIS — R7989 Other specified abnormal findings of blood chemistry: Secondary | ICD-10-CM

## 2013-08-30 DIAGNOSIS — I214 Non-ST elevation (NSTEMI) myocardial infarction: Secondary | ICD-10-CM

## 2013-08-30 DIAGNOSIS — E785 Hyperlipidemia, unspecified: Secondary | ICD-10-CM | POA: Diagnosis present

## 2013-08-30 DIAGNOSIS — J449 Chronic obstructive pulmonary disease, unspecified: Secondary | ICD-10-CM | POA: Diagnosis present

## 2013-08-30 DIAGNOSIS — Z853 Personal history of malignant neoplasm of breast: Secondary | ICD-10-CM | POA: Diagnosis not present

## 2013-08-30 DIAGNOSIS — Z8049 Family history of malignant neoplasm of other genital organs: Secondary | ICD-10-CM

## 2013-08-30 DIAGNOSIS — I2510 Atherosclerotic heart disease of native coronary artery without angina pectoris: Secondary | ICD-10-CM | POA: Diagnosis present

## 2013-08-30 DIAGNOSIS — I4892 Unspecified atrial flutter: Secondary | ICD-10-CM

## 2013-08-30 DIAGNOSIS — E876 Hypokalemia: Secondary | ICD-10-CM

## 2013-08-30 DIAGNOSIS — Z886 Allergy status to analgesic agent status: Secondary | ICD-10-CM | POA: Diagnosis not present

## 2013-08-30 DIAGNOSIS — R945 Abnormal results of liver function studies: Secondary | ICD-10-CM

## 2013-08-30 DIAGNOSIS — G4730 Sleep apnea, unspecified: Secondary | ICD-10-CM

## 2013-08-30 DIAGNOSIS — S0003XA Contusion of scalp, initial encounter: Secondary | ICD-10-CM | POA: Diagnosis not present

## 2013-08-30 DIAGNOSIS — M5416 Radiculopathy, lumbar region: Secondary | ICD-10-CM

## 2013-08-30 DIAGNOSIS — Q2112 Patent foramen ovale: Secondary | ICD-10-CM

## 2013-08-30 DIAGNOSIS — R000 Tachycardia, unspecified: Secondary | ICD-10-CM

## 2013-08-30 DIAGNOSIS — I5031 Acute diastolic (congestive) heart failure: Secondary | ICD-10-CM

## 2013-08-30 DIAGNOSIS — Z87891 Personal history of nicotine dependence: Secondary | ICD-10-CM

## 2013-08-30 DIAGNOSIS — Z888 Allergy status to other drugs, medicaments and biological substances status: Secondary | ICD-10-CM

## 2013-08-30 DIAGNOSIS — M25579 Pain in unspecified ankle and joints of unspecified foot: Secondary | ICD-10-CM | POA: Diagnosis not present

## 2013-08-30 DIAGNOSIS — I252 Old myocardial infarction: Secondary | ICD-10-CM | POA: Diagnosis not present

## 2013-08-30 DIAGNOSIS — K3580 Unspecified acute appendicitis: Secondary | ICD-10-CM

## 2013-08-30 DIAGNOSIS — K635 Polyp of colon: Secondary | ICD-10-CM

## 2013-08-30 DIAGNOSIS — S064X9A Epidural hemorrhage with loss of consciousness of unspecified duration, initial encounter: Secondary | ICD-10-CM | POA: Diagnosis not present

## 2013-08-30 DIAGNOSIS — Z6841 Body Mass Index (BMI) 40.0 and over, adult: Secondary | ICD-10-CM | POA: Diagnosis not present

## 2013-08-30 DIAGNOSIS — Z7901 Long term (current) use of anticoagulants: Secondary | ICD-10-CM | POA: Diagnosis not present

## 2013-08-30 DIAGNOSIS — F329 Major depressive disorder, single episode, unspecified: Secondary | ICD-10-CM

## 2013-08-30 DIAGNOSIS — Z8249 Family history of ischemic heart disease and other diseases of the circulatory system: Secondary | ICD-10-CM | POA: Diagnosis not present

## 2013-08-30 DIAGNOSIS — R918 Other nonspecific abnormal finding of lung field: Secondary | ICD-10-CM | POA: Diagnosis not present

## 2013-08-30 DIAGNOSIS — R55 Syncope and collapse: Secondary | ICD-10-CM | POA: Diagnosis not present

## 2013-08-30 DIAGNOSIS — R059 Cough, unspecified: Secondary | ICD-10-CM

## 2013-08-30 DIAGNOSIS — Z803 Family history of malignant neoplasm of breast: Secondary | ICD-10-CM

## 2013-08-30 DIAGNOSIS — Z79899 Other long term (current) drug therapy: Secondary | ICD-10-CM

## 2013-08-30 DIAGNOSIS — G629 Polyneuropathy, unspecified: Secondary | ICD-10-CM

## 2013-08-30 DIAGNOSIS — R7302 Impaired glucose tolerance (oral): Secondary | ICD-10-CM

## 2013-08-30 DIAGNOSIS — I498 Other specified cardiac arrhythmias: Secondary | ICD-10-CM | POA: Diagnosis not present

## 2013-08-30 DIAGNOSIS — Q211 Atrial septal defect: Secondary | ICD-10-CM

## 2013-08-30 DIAGNOSIS — J4489 Other specified chronic obstructive pulmonary disease: Secondary | ICD-10-CM

## 2013-08-30 DIAGNOSIS — M5412 Radiculopathy, cervical region: Secondary | ICD-10-CM

## 2013-08-30 DIAGNOSIS — T1490XA Injury, unspecified, initial encounter: Secondary | ICD-10-CM | POA: Diagnosis not present

## 2013-08-30 DIAGNOSIS — F32A Depression, unspecified: Secondary | ICD-10-CM

## 2013-08-30 HISTORY — DX: Syncope and collapse: R55

## 2013-08-30 HISTORY — DX: Unspecified atrial fibrillation: I48.91

## 2013-08-30 LAB — CBC WITH DIFFERENTIAL/PLATELET
Basophils Absolute: 0 10*3/uL (ref 0.0–0.1)
Eosinophils Relative: 3 % (ref 0–5)
Hemoglobin: 12.9 g/dL (ref 12.0–15.0)
Lymphocytes Relative: 27 % (ref 12–46)
Lymphs Abs: 2.1 10*3/uL (ref 0.7–4.0)
MCV: 91.6 fL (ref 78.0–100.0)
Monocytes Absolute: 0.5 10*3/uL (ref 0.1–1.0)
Neutro Abs: 4.9 10*3/uL (ref 1.7–7.7)
Neutrophils Relative %: 64 % (ref 43–77)
Platelets: 228 10*3/uL (ref 150–400)
RBC: 4.28 MIL/uL (ref 3.87–5.11)
RDW: 15 % (ref 11.5–15.5)
WBC: 7.7 10*3/uL (ref 4.0–10.5)

## 2013-08-30 LAB — PROTIME-INR
INR: 1.19 (ref 0.00–1.49)
Prothrombin Time: 14.8 seconds (ref 11.6–15.2)

## 2013-08-30 LAB — BASIC METABOLIC PANEL
CO2: 27 mEq/L (ref 19–32)
Calcium: 9.5 mg/dL (ref 8.4–10.5)
Creatinine, Ser: 0.89 mg/dL (ref 0.50–1.10)
GFR calc Af Amer: 76 mL/min — ABNORMAL LOW (ref >90)
Potassium: 4.1 mEq/L (ref 3.5–5.1)
Sodium: 140 mEq/L (ref 135–145)

## 2013-08-30 LAB — CREATININE, SERUM
Creatinine, Ser: 0.92 mg/dL (ref 0.50–1.10)
GFR calc non Af Amer: 63 mL/min — ABNORMAL LOW (ref >90)

## 2013-08-30 LAB — CBC
HCT: 37 % (ref 36.0–46.0)
Hemoglobin: 11.7 g/dL — ABNORMAL LOW (ref 12.0–15.0)
MCHC: 31.6 g/dL (ref 30.0–36.0)
MCV: 91.8 fL (ref 78.0–100.0)
RDW: 15 % (ref 11.5–15.5)

## 2013-08-30 LAB — TROPONIN I: Troponin I: <0.3 ng/mL (ref <0.30)

## 2013-08-30 MED ORDER — VITAMIN D3 25 MCG (1000 UNIT) PO TABS
2000.0000 [IU] | ORAL_TABLET | Freq: Every day | ORAL | Status: DC
  Administered 2013-08-31 – 2013-09-01 (×2): 2000 [IU] via ORAL
  Filled 2013-08-30 (×2): qty 2

## 2013-08-30 MED ORDER — DULOXETINE HCL 60 MG PO CPEP
60.0000 mg | ORAL_CAPSULE | Freq: Every day | ORAL | Status: DC
  Administered 2013-08-30 – 2013-08-31 (×2): 60 mg via ORAL
  Filled 2013-08-30 (×4): qty 1

## 2013-08-30 MED ORDER — CEFUROXIME AXETIL 500 MG PO TABS
500.0000 mg | ORAL_TABLET | Freq: Two times a day (BID) | ORAL | Status: DC
Start: 2013-08-31 — End: 2013-09-01
  Administered 2013-08-31 – 2013-09-01 (×3): 500 mg via ORAL
  Filled 2013-08-30 (×6): qty 1

## 2013-08-30 MED ORDER — ALLOPURINOL 300 MG PO TABS
300.0000 mg | ORAL_TABLET | Freq: Every day | ORAL | Status: DC
  Administered 2013-08-31 – 2013-09-01 (×2): 300 mg via ORAL
  Filled 2013-08-30 (×2): qty 1

## 2013-08-30 MED ORDER — ALPRAZOLAM 0.25 MG PO TABS: 0.2500 mg | ORAL_TABLET | Freq: Two times a day (BID) | ORAL | Status: DC | PRN

## 2013-08-30 MED ORDER — LEVOTHYROXINE SODIUM 200 MCG PO TABS
200.0000 ug | ORAL_TABLET | Freq: Every day | ORAL | Status: DC
  Administered 2013-08-31 – 2013-09-01 (×2): 200 ug via ORAL
  Filled 2013-08-30 (×3): qty 1

## 2013-08-30 MED ORDER — OMEGA-3-ACID ETHYL ESTERS 1 G PO CAPS
1.0000 g | ORAL_CAPSULE | Freq: Every day | ORAL | Status: DC
  Administered 2013-08-31 – 2013-09-01 (×2): 1 g via ORAL
  Filled 2013-08-30 (×2): qty 1

## 2013-08-30 MED ORDER — ZOLPIDEM TARTRATE 5 MG PO TABS: 5.0000 mg | ORAL_TABLET | Freq: Every evening | ORAL | Status: DC | PRN

## 2013-08-30 MED ORDER — ACETAMINOPHEN 325 MG PO TABS
650.0000 mg | ORAL_TABLET | ORAL | Status: DC | PRN
  Administered 2013-08-30: 650 mg via ORAL

## 2013-08-30 MED ORDER — ENOXAPARIN SODIUM 40 MG/0.4ML ~~LOC~~ SOLN
40.0000 mg | SUBCUTANEOUS | Status: DC
  Administered 2013-08-30: 40 mg via SUBCUTANEOUS
  Filled 2013-08-30 (×2): qty 0.4

## 2013-08-30 MED ORDER — VITAMIN D 50 MCG (2000 UT) PO TABS: 2000.0000 [IU] | ORAL_TABLET | Freq: Every day | ORAL | Status: DC

## 2013-08-30 MED ORDER — FERROUS SULFATE 325 (65 FE) MG PO TABS
325.0000 mg | ORAL_TABLET | Freq: Every day | ORAL | Status: DC
  Administered 2013-08-31 – 2013-09-01 (×2): 325 mg via ORAL
  Filled 2013-08-30 (×3): qty 1

## 2013-08-30 MED ORDER — VITAMIN C 500 MG PO TABS
500.0000 mg | ORAL_TABLET | Freq: Every day | ORAL | Status: DC
  Administered 2013-08-30 – 2013-08-31 (×2): 500 mg via ORAL
  Filled 2013-08-30 (×4): qty 1

## 2013-08-30 MED ORDER — ACETAMINOPHEN 325 MG PO TABS
650.0000 mg | ORAL_TABLET | Freq: Four times a day (QID) | ORAL | Status: DC | PRN
  Filled 2013-08-30: qty 2

## 2013-08-30 MED ORDER — ONDANSETRON HCL 4 MG/2ML IJ SOLN: 4.0000 mg | Freq: Four times a day (QID) | INTRAMUSCULAR | Status: DC | PRN

## 2013-08-30 MED ORDER — ALBUTEROL SULFATE HFA 108 (90 BASE) MCG/ACT IN AERS
2.0000 | INHALATION_SPRAY | Freq: Four times a day (QID) | RESPIRATORY_TRACT | Status: DC | PRN
  Administered 2013-08-31: 2 via RESPIRATORY_TRACT
  Filled 2013-08-30: qty 6.7

## 2013-08-30 NOTE — ED Notes (Signed)
medtronic rep and EDP at bedside

## 2013-08-30 NOTE — ED Notes (Signed)
Pt c/o palpitations & "breathing fast" prior to syncopal episode. Abrasion & hematoma to right side forehead, echhymosis to right eye from previous fall on Sunday.

## 2013-08-30 NOTE — H&P (Addendum)
Primary cardiologist:  Clinical Summary [name] [name] is a 67 y.o.female with a history of non-obstructive CAD, OSA, hypothyroidism, and atrial flutter admitted with syncope. Reports an episode of syncope several years ago, then no repeat episodes until recently. She has had 2-3 episodes weekly of the feeling of her heart racing, SOB, diaphoresis for quite some time, typically comes on with emotional stress and usually calming herself down helps relieve symptoms. She reports an episode on Sunday after being emotionally stressed regarding family issues with her grandson. One of her typical episodes came on of palpitations, SOB, and diaphoresis that didn't seem to resolve. It progressed until she felt very lightheaded, and then loss consciousness, she fell and struck her head. She was admitted and seen by EP, it was decided to place a loop recorder for unexplained syncope which was placed 08/26/13. Patient reports again today, while sitting on the commode she had a similar prodrome followed by a repeat episode of syncope, once again striking her head. EKG shows normal sinus rhythm. CT head shows scalp hematoma, no intracranial bleed. At this time we are awaiting loop recorder report.      Allergies  Allergen Reactions  . Codeine     REACTION: unknown - pt. states unaware of having reaction to codeine  . Lisinopril     REACTION: cough    Home Medications Reviewed in epic  Past Medical History  Diagnosis Date  . Hypertension   . Hyperlipidemia   . Anemia   . CAD (coronary artery disease)     a. Nonobstructive CAD 2007 (EF 75%.  RCA  proximal 75% tubular, 25% prox LAD, 25% d1, 50% D2) at [facility]. b. NSTEMI 01/2010 in setting of respiratory failure, medical approach (not a good candidate for noninvasive eval)  . OSA on CPAP   . Hypothyroidism   . Peripheral edema 06/13/2011  . Asthma 06/13/2011  . Depression 06/13/2011  . Neuropathy 06/13/2011  . Cervical radiculopathy 06/13/2011  . Gout 06/13/2011   . Obesity hypoventilation syndrome 06/13/2011  . COPD (chronic obstructive pulmonary disease)     on O2  . Breast cancer dx'd 2005    left  . Cellulitis     hx of cellulitis in left leg  . Impaired glucose tolerance 11/01/2012  . PFO (patent foramen ovale)   . Atrial flutter   . Syncope   . Atrial fibrillation     Past Surgical History  Procedure Laterality Date  . Ovarian cyst removal    . Breast lumpectomy    . Cervical disc surgery      have cadaver bones,, screws, and plates   . Cervical biopsy  June 2013    at [facility] for Heavy  Bleeding  . Dilation and curettage of uterus    . Colonoscopy    . Laparoscopic appendectomy  03/29/2012    Procedure: APPENDECTOMY LAPAROSCOPIC;  Surgeon: Adin Hector, MD;  Location: [facility];  Service: General;  Laterality: N/A;  . Tee without cardioversion N/A 03/15/2013    Procedure: TRANSESOPHAGEAL ECHOCARDIOGRAM (TEE);  Surgeon: Thayer Headings, MD;  Location: [place];  Service: Cardiovascular;  Laterality: N/A;  . Cardioversion N/A 03/15/2013    Procedure: CARDIOVERSION;  Surgeon: Thayer Headings, MD;  Location: [facility] ENDOSCOPY;  Service: Cardiovascular;  Laterality: N/A;  . Loop recorder implant  08-28-2013    MDT LinQ implanted by Dr Rayann Heman for syncope    Family History  Problem Relation Age of Onset  . Breast cancer    .  Uterine cancer Mother   . Cancer Mother     Theadora Rama  . Heart disease Mother   . Cancer Sister 60    breast  . Diabetes Brother   . Cancer Maternal Aunt     breast  . Cancer Cousin 69    breast    Social History [name] [name] reports that she quit smoking about 14 years ago. Her smoking use included Cigarettes. She has a 20 pack-year smoking history. She has never used smokeless tobacco. [name] [name] reports that she drinks about 0.6 ounces of alcohol per week.  Review of Systems 14 point review of systems negative other than presented in HPI  Physical Examination Temp:  [98.2 F (36.8 C)] 98.2 F (36.8 C)  (12/11 1459) Pulse Rate:  [83-93] 88 (12/11 1811) Resp:  [17-22] 19 (12/11 1811) BP: (88-123)/(43-68) 115/68 mmHg (12/11 1816) SpO2:  [97 %-100 %] 100 % (12/11 1811) Weight:  [351 lb (159.213 kg)] 351 lb (159.213 kg) (12/11 1459) No intake or output data in the 24 hours ending 08/30/13 1931  Gen: NAD HEENT: bruising of forehead CV: RRR, no m/r/g, no JVD, no carotid bruising Pulm: CTAB Abd; Soft, NT, ND, obese Extremities: warm, no edema Neuro: A&Ox3, no focal deficits   Lab Results  Basic Metabolic Panel:  Recent Labs Lab 08/26/13 1810 08/27/13 0130 08/30/13 1540  NA 141 141 140  K 4.1 4.0 4.1  CL 102 103 100  CO2 28 28 27   GLUCOSE 91 178* 85  BUN 10 11 10   CREATININE 0.91 0.95 0.89  CALCIUM 9.5 9.3 9.5    Liver Function Tests:  Recent Labs Lab 08/27/13 0130  AST 35  ALT 32  ALKPHOS 74  BILITOT 0.6  PROT 7.2  ALBUMIN 3.6    CBC:  Recent Labs Lab 08/26/13 1810 08/27/13 0130 08/30/13 1540  WBC 7.4 9.5 7.7  NEUTROABS 4.5 6.2 4.9  HGB 13.6 12.7 12.9  HCT 41.2 39.1 39.2  MCV 90.9 90.9 91.6  PLT 226 214 228    Cardiac Enzymes:  Recent Labs Lab 08/27/13 0130 08/27/13 0659 08/27/13 1225 08/30/13 1542  TROPONINI <0.30 <0.30 <0.30 <0.30    BNP: No components found with this basename: POCBNP,    ECG 08/30/13 EKG: sinus rhtyhm  Diagnostic Studies 02/2012 Echo LVEF 55-60%, now WMAs, mild AI, moderate LAE.   TSH 1.585   Impression/Recommendations 1. Syncope - recurrent episode, unclear etiology at this time. We are awaiting the loop recorder interrogation results.  - we will bring patient in for observation overnight to telemetry - we will hold her blood pressure medications as her blood pressure is currently low normal - she will be seen by EP in the morning, we will keep her NPO overnight in the event of possible procedure in the morning. We will also hold her elaquis for possible procedure, and elavil due to strong association with  syncope and arrhythmias.    Carlyle Dolly MD    840 pm Addendum Loop recorder report shows 10 second pause. We will admit the patient to stepdown unit for closer observation. She currently has heart rates in the 90s and stable blood pressure. She will be seen by EP in the morning for evaluation for possible pacemaker. She will be kept NPO and her elaquis on hold, her last dose of elaquis was yesterday at 11AM.  Carlyle Dolly MD

## 2013-08-30 NOTE — ED Notes (Signed)
IV team aware of need for IV

## 2013-08-30 NOTE — ED Notes (Signed)
Cardiology informed of Medtronic results.

## 2013-08-30 NOTE — ED Notes (Signed)
Loop implant recorder device interrogated using Medtronic.

## 2013-08-30 NOTE — ED Notes (Addendum)
Pt had syncopal episode in bathroom, fell & hit head. Pt ambulatory upon EMS arrival. Pt discharged yesterday for same, had a loop implant recorder placed 2 days ago. Denies CP or dizziness presently.

## 2013-08-30 NOTE — ED Notes (Signed)
Cardiology MD at bedside.

## 2013-08-30 NOTE — ED Notes (Signed)
Vital signs stable. 

## 2013-08-30 NOTE — ED Provider Notes (Signed)
CSN: [id number]     Arrival date & time 08/30/13  1452 History   First MD Initiated Contact with Patient 08/30/13 1507     Chief Complaint  Patient presents with  . Loss of Consciousness  . Fall   (Consider location/radiation/quality/duration/timing/severity/associated sxs/prior Treatment) HPI Comments: Pt also had near syncope episode while seating in the car on teh way home from hospital d/c for similar symptoms.   Patient is a 67 y.o. female presenting with syncope. The history is provided by the patient. No language interpreter was used.  Loss of Consciousness Episode history:  Single Most recent episode:  Today Duration: unsure. Timing:  Sporadic Progression:  Resolved Chronicity:  Recurrent Context comment:  Micturation Witnessed: no   Relieved by:  Lying down Worsened by:  Nothing tried Ineffective treatments:  None tried Associated symptoms: anxiety, diaphoresis, difficulty breathing, recent fall, recent injury and shortness of breath   Associated symptoms: no chest pain, no confusion, no fever, no headaches, no nausea, no palpitations, no vomiting and no weakness   Associated symptoms comment:  L lateral ankle pain Shortness of breath:    Severity:  Moderate   Onset quality:  Sudden   Duration: mins preceeding the event.   Timing:  Sporadic   Progression:  Resolved Risk factors: coronary artery disease     Past Medical History  Diagnosis Date  . Hypertension   . Hyperlipidemia   . Anemia   . CAD (coronary artery disease)     a. Nonobstructive CAD 2007 (EF 75%.  RCA  proximal 75% tubular, 25% prox LAD, 25% d1, 50% D2) at [facility]. b. NSTEMI 01/2010 in setting of respiratory failure, medical approach (not a good candidate for noninvasive eval)  . OSA on CPAP   . Hypothyroidism   . Peripheral edema 06/13/2011  . Asthma 06/13/2011  . Depression 06/13/2011  . Neuropathy 06/13/2011  . Cervical radiculopathy 06/13/2011  . Gout 06/13/2011  . Obesity hypoventilation syndrome  06/13/2011  . COPD (chronic obstructive pulmonary disease)     on O2  . Breast cancer dx'd 2005    left  . Cellulitis     hx of cellulitis in left leg  . Impaired glucose tolerance 11/01/2012  . PFO (patent foramen ovale)   . Atrial flutter   . Syncope   . Atrial fibrillation    Past Surgical History  Procedure Laterality Date  . Ovarian cyst removal    . Breast lumpectomy    . Cervical disc surgery      have cadaver bones,, screws, and plates   . Cervical biopsy  June 2013    at [facility] for Heavy  Bleeding  . Dilation and curettage of uterus    . Colonoscopy    . Laparoscopic appendectomy  03/29/2012    Procedure: APPENDECTOMY LAPAROSCOPIC;  Surgeon: Adin Hector, MD;  Location: [facility];  Service: General;  Laterality: N/A;  . Tee without cardioversion N/A 03/15/2013    Procedure: TRANSESOPHAGEAL ECHOCARDIOGRAM (TEE);  Surgeon: Thayer Headings, MD;  Location: [place];  Service: Cardiovascular;  Laterality: N/A;  . Cardioversion N/A 03/15/2013    Procedure: CARDIOVERSION;  Surgeon: Thayer Headings, MD;  Location: [facility] ENDOSCOPY;  Service: Cardiovascular;  Laterality: N/A;  . Loop recorder implant  08-28-2013    MDT LinQ implanted by Dr Rayann Heman for syncope   Family History  Problem Relation Age of Onset  . Breast cancer    . Uterine cancer Mother   . Cancer Mother  Theadora Rama  . Heart disease Mother   . Cancer Sister 69    breast  . Diabetes Brother   . Cancer Maternal Aunt     breast  . Cancer Cousin 40    breast   History  Substance Use Topics  . Smoking status: Former Smoker -- 1.00 packs/day for 20 years    Types: Cigarettes    Quit date: 09/20/1998  . Smokeless tobacco: Never Used     Comment: 1ppd x 20 years  . Alcohol Use: 0.6 oz/week    1 Glasses of wine per week     Comment: rare   OB History   Grav Para Term Preterm Abortions TAB SAB Ect Mult Living                 Review of Systems  Constitutional: Positive for diaphoresis. Negative for fever,  chills, activity change, appetite change and fatigue.  HENT: Negative for congestion, facial swelling, rhinorrhea and sore throat.   Eyes: Negative for photophobia and discharge.  Respiratory: Positive for shortness of breath. Negative for cough and chest tightness.   Cardiovascular: Positive for syncope. Negative for chest pain, palpitations and leg swelling.  Gastrointestinal: Negative for nausea, vomiting, abdominal pain and diarrhea.  Endocrine: Negative for polydipsia and polyuria.  Genitourinary: Negative for dysuria, frequency, difficulty urinating and pelvic pain.  Musculoskeletal: Negative for arthralgias, back pain, neck pain and neck stiffness.  Skin: Negative for color change and wound.  Allergic/Immunologic: Negative for immunocompromised state.  Neurological: Negative for facial asymmetry, weakness, numbness and headaches.  Hematological: Does not bruise/bleed easily.  Psychiatric/Behavioral: Negative for confusion and agitation.    Allergies  Codeine and Lisinopril  Home Medications   No current outpatient prescriptions on file. BP 119/64  Pulse 95  Temp(Src) 97.3 F (36.3 C) (Oral)  Resp 22  Ht 5' 5"  (1.651 m)  Wt 351 lb (159.213 kg)  BMI 58.41 kg/m2  SpO2 99% Physical Exam  Constitutional: She is oriented to person, place, and time. She appears well-developed and well-nourished. No distress.  obese  HENT:  Head: Normocephalic and atraumatic.  Mouth/Throat: No oropharyngeal exudate.  Eyes: Pupils are equal, round, and reactive to light.  Neck: Normal range of motion. Neck supple.  Cardiovascular: Normal rate, regular rhythm and normal heart sounds.  Exam reveals no gallop and no friction rub.   No murmur heard. Pulmonary/Chest: Effort normal and breath sounds normal. No respiratory distress. She has no wheezes. She has no rales.  Abdominal: Soft. Bowel sounds are normal. She exhibits no distension and no mass. There is no tenderness. There is no rebound and  no guarding.  Musculoskeletal: Normal range of motion. She exhibits no edema.       Left ankle: Tenderness. Lateral malleolus tenderness found.  Neurological: She is alert and oriented to person, place, and time. She has normal strength. She displays no tremor. No cranial nerve deficit or sensory deficit. She exhibits normal muscle tone. She displays a negative Romberg sign. Coordination and gait normal. GCS eye subscore is 4. GCS verbal subscore is 5. GCS motor subscore is 6.  Skin: Skin is warm and dry.  Psychiatric: She has a normal mood and affect.    ED Course  Procedures (including critical care time) Labs Review Labs Reviewed  BASIC METABOLIC PANEL - Abnormal; Notable for the following:    GFR calc non Af Amer 66 (*)    GFR calc Af Amer 76 (*)    All other components within normal  limits  CBC - Abnormal; Notable for the following:    Hemoglobin 11.7 (*)    All other components within normal limits  CREATININE, SERUM - Abnormal; Notable for the following:    GFR calc non Af Amer 63 (*)    GFR calc Af Amer 73 (*)    All other components within normal limits  MRSA PCR SCREENING  CBC WITH DIFFERENTIAL  TROPONIN I  PROTIME-INR   Imaging Review Dg Ankle Complete Left  08/30/2013   CLINICAL DATA:  Left ankle pain.  EXAM: LEFT ANKLE COMPLETE - 3+ VIEW  COMPARISON:  None.  FINDINGS: There is no evidence of fracture, dislocation, or joint effusion. There is no evidence of arthropathy or other focal bone abnormality. Soft tissues are unremarkable.  IMPRESSION: Negative.   Electronically Signed   By: Kathreen Devoid   On: 08/30/2013 16:45   Ct Head Wo Contrast  08/30/2013   CLINICAL DATA:  Loss of consciousness.  Fall.  Frontal hematoma.  EXAM: CT HEAD WITHOUT CONTRAST  TECHNIQUE: Contiguous axial images were obtained from the base of the skull through the vertex without intravenous contrast.  COMPARISON:  08/26/2013  FINDINGS: There is a large right frontal scalp hematoma, increased in  size compared to the prior study. No skull fracture is identified. There is no evidence of acute cortical infarct, mass, midline shift, intracranial hemorrhage, or extra-axial fluid collection. Ventricles and sulci are within normal limits for age. Orbits are unremarkable. Visualized paranasal sinuses and mastoid air cells are clear.  IMPRESSION: Large right frontal scalp hematoma. No evidence of skull fracture or intracranial hemorrhage.   Electronically Signed   By: Logan Bores   On: 08/30/2013 16:15    EKG Interpretation    Date/Time:  Thursday August 30 2013 15:06:17 EST Ventricular Rate:  85 PR Interval:  169 QRS Duration: 77 QT Interval:  372 QTC Calculation: 442 R Axis:   65 Text Interpretation:  Sinus rhythm Abnormal R-wave progression, early transition Otherwise no significant change Confirmed by DOCHERTY  MD, MEGAN ([id number]) on 08/31/2013 1:43:36 PM            MDM   1. Syncope   2. Atrial fibrillation   3. Obesity hypoventilation syndrome   4. Sick sinus syndrome   5. Syncope and collapse    Pt is a 67 y.o. female with Pmhx as above who presents with syncopal episode during or shortly after micturition.  She states she thinks she hit her head on the door frame on the way down.  She was admitted for same this week, d/c'd yesterday, had  loop recorder implanted.  Episodes start w/ sensation of anxiety, SOB, tachypnea, diaphoresis then lead to syncope or near syncope. No CP, palpitations, n/v.  No known sz like activity.  Pt states she feels fine now, though has some localized tenderness over forehead hematoma. CT head ordered as pt on Eliquis, was negative.  Loop recorder interrogated & pt found ot have about 4.5 seconds of asystole around time of event today and HR in the 30's for several beats overnight.  Cardiology consulted, will admit. Pt will be seen by EP in the morning for possible pacemaker placement.         Neta Ehlers, MD 08/31/13 [phone]

## 2013-08-30 NOTE — ED Notes (Addendum)
[employer] "loop recorder" interrogated, awaiting fax, will inform cardiology.

## 2013-08-30 NOTE — ED Notes (Signed)
Patient transported to CT & X ray. Unsuccessful IV attempt. IV team paged

## 2013-08-30 NOTE — Telephone Encounter (Signed)
New message    Released from hosp yesterday----just passed out and hit her head.   She want to know if she should call 911 or come see Dr Phil Dopp Luisa Dago I told neighbor to call 911.

## 2013-08-31 ENCOUNTER — Encounter (HOSPITAL_COMMUNITY): Payer: Self-pay | Admitting: *Deleted

## 2013-08-31 ENCOUNTER — Encounter (HOSPITAL_COMMUNITY)
Admission: EM | Disposition: A | Discharge status: Home / Self Care | Payer: Self-pay | Source: Home / Self Care | Attending: Cardiology

## 2013-08-31 DIAGNOSIS — E662 Morbid (severe) obesity with alveolar hypoventilation: Secondary | ICD-10-CM | POA: Diagnosis not present

## 2013-08-31 DIAGNOSIS — I495 Sick sinus syndrome: Secondary | ICD-10-CM | POA: Diagnosis not present

## 2013-08-31 DIAGNOSIS — R55 Syncope and collapse: Secondary | ICD-10-CM

## 2013-08-31 DIAGNOSIS — N390 Urinary tract infection, site not specified: Secondary | ICD-10-CM | POA: Diagnosis not present

## 2013-08-31 DIAGNOSIS — Z6841 Body Mass Index (BMI) 40.0 and over, adult: Secondary | ICD-10-CM | POA: Diagnosis not present

## 2013-08-31 DIAGNOSIS — I498 Other specified cardiac arrhythmias: Secondary | ICD-10-CM | POA: Diagnosis not present

## 2013-08-31 DIAGNOSIS — I4891 Unspecified atrial fibrillation: Secondary | ICD-10-CM

## 2013-08-31 HISTORY — PX: PACEMAKER INSERTION: SHX728

## 2013-08-31 HISTORY — PX: LOOP RECORDER EXPLANT: SHX5476

## 2013-08-31 HISTORY — PX: PERMANENT PACEMAKER INSERTION: SHX5480

## 2013-08-31 LAB — MRSA PCR SCREENING: MRSA by PCR: NEGATIVE

## 2013-08-31 SURGERY — PERMANENT PACEMAKER INSERTION
Anesthesia: LOCAL

## 2013-08-31 MED ORDER — SODIUM CHLORIDE 0.9 % IV SOLN
INTRAVENOUS | Status: DC
  Administered 2013-08-31: 09:00:00 via INTRAVENOUS

## 2013-08-31 MED ORDER — HEPARIN (PORCINE) IN NACL 2-0.9 UNIT/ML-% IJ SOLN
INTRAMUSCULAR | Status: AC
  Filled 2013-08-31: qty 500

## 2013-08-31 MED ORDER — LIDOCAINE HCL (PF) 1 % IJ SOLN
INTRAMUSCULAR | Status: AC
  Filled 2013-08-31: qty 30

## 2013-08-31 MED ORDER — CHLORHEXIDINE GLUCONATE 4 % EX LIQD
60.0000 mL | Freq: Once | CUTANEOUS | Status: AC
  Administered 2013-08-31: 4 via TOPICAL

## 2013-08-31 MED ORDER — DEXTROSE 5 % IV SOLN
3.0000 g | INTRAVENOUS | Status: DC
  Filled 2013-08-31: qty 3000

## 2013-08-31 MED ORDER — ACETAMINOPHEN 325 MG PO TABS: 325.0000 mg | ORAL_TABLET | ORAL | Status: DC | PRN

## 2013-08-31 MED ORDER — LIDOCAINE HCL (PF) 1 % IJ SOLN
INTRAMUSCULAR | Status: AC
  Filled 2013-08-31: qty 60

## 2013-08-31 MED ORDER — HYDROCODONE-ACETAMINOPHEN 5-325 MG PO TABS
1.0000 | ORAL_TABLET | ORAL | Status: DC | PRN
  Administered 2013-08-31: 2 via ORAL
  Administered 2013-09-01: 1 via ORAL
  Filled 2013-08-31: qty 2
  Filled 2013-08-31: qty 1

## 2013-08-31 MED ORDER — SODIUM CHLORIDE 0.9 % IJ SOLN: 3.0000 mL | INTRAMUSCULAR | Status: DC | PRN

## 2013-08-31 MED ORDER — SODIUM CHLORIDE 0.9 % IR SOLN
80.0000 mg | Status: DC
  Filled 2013-08-31: qty 2

## 2013-08-31 MED ORDER — SODIUM CHLORIDE 0.9 % IV SOLN: 250.0000 mL | INTRAVENOUS | Status: DC | PRN

## 2013-08-31 MED ORDER — ONDANSETRON HCL 4 MG/2ML IJ SOLN: 4.0000 mg | Freq: Four times a day (QID) | INTRAMUSCULAR | Status: DC | PRN

## 2013-08-31 MED ORDER — CHLORHEXIDINE GLUCONATE 4 % EX LIQD
60.0000 mL | Freq: Once | CUTANEOUS | Status: AC
  Filled 2013-08-31: qty 60

## 2013-08-31 MED ORDER — CEFAZOLIN SODIUM 1-5 GM-% IV SOLN
1.0000 g | Freq: Four times a day (QID) | INTRAVENOUS | Status: DC
  Administered 2013-08-31 – 2013-09-01 (×2): 1 g via INTRAVENOUS
  Filled 2013-08-31 (×3): qty 50

## 2013-08-31 MED ORDER — SODIUM CHLORIDE 0.9 % IJ SOLN
3.0000 mL | Freq: Two times a day (BID) | INTRAMUSCULAR | Status: DC
  Administered 2013-08-31: 3 mL via INTRAVENOUS

## 2013-08-31 NOTE — Progress Notes (Signed)
RT placed patient on CPAP per home regimine. Patient unsure of settings. RT looked at prior RT notes. RT placed patient on 10 cmH20 as in previous notes. Water chamber filled for humidity. Patient is tolerating well at this time. RT will continue to assess as needed.

## 2013-08-31 NOTE — Interval H&P Note (Signed)
History and Physical Interval Note:  08/31/2013 2:28 PM  [name] [name]  has presented today for surgery, with the diagnosis of Heart Failure  The various methods of treatment have been discussed with the patient and family. After consideration of risks, benefits and other options for treatment, the patient has consented to  Procedure(s): PERMANENT PACEMAKER INSERTION (N/A) with implantable loop recorder removal as a surgical intervention .  The patient's history has been reviewed, patient examined, no change in status, stable for surgery.  I have reviewed the patient's chart and labs.  Questions were answered to the patient's satisfaction.     Thompson Grayer

## 2013-08-31 NOTE — H&P (View-Only) (Signed)
ELECTROPHYSIOLOGY CONSULT NOTE    Patient ID: [name] [name] MRN: [id number], DOB/AGE: 01/20/1946 67 y.o.  Admit date: 08/30/2013 Date of Consult: 08-31-2013  Primary Physician: [name], MD Primary Cardiologist: Kirk Ruths, MD Electrophysiologist: Thompson Grayer, MD  Reason for Consultation: syncope  HPI:  [name] [name] is a 67 year old female with a past medical history significant for hypertension, hyperlipidemia, nonobstructive coronary disease, sleep apnea (on CPAP), hypothyroidism, atrial fibrillation, asthma, and peripheral neuropathy.  She was discharged yesterday after a syncopal episode on Sunday and her work up was negative. She was monitored on telemetry which demonstrated no arrhythmias.  A loop recorder was placed 08-28-2013 prior to discharge.    Yesterday, she woke up, got up and went into the bathroom. She urinated and was sitting on the commode waiting for the water in the sink to warm up.  She then had a sudden syncopal spell and fell and hit her head.  She called 911 and was brought to [facility] for further evaluation. Interrogation of her loop recorder demonstrated a period of bradycardia at the time of her syncopal spell.  There was R-R prolongation which preceded the bradycardia. There was another episode of bradycardia the night before with the same R-R prolongation.  The patient was sleeping at the time and had no symptoms.  She was diagnosed with a UTI during her last admission and is currently on antibiotics.   She denies chest pain, shortness of breath, pre-syncope, or palpitations.  She reports no neurologic symptoms related to her fall.     EP has been asked to evaluate for treatment options.    ROS is negative except as outlined above.   Past Medical History  Diagnosis Date  . Hypertension   . Hyperlipidemia   . Anemia   . CAD (coronary artery disease)     a. Nonobstructive CAD 2007 (EF 75%.  RCA  proximal 75% tubular, 25% prox LAD, 25% d1, 50% D2) at [facility].  b. NSTEMI 01/2010 in setting of respiratory failure, medical approach (not a good candidate for noninvasive eval)  . OSA on CPAP   . Hypothyroidism   . Peripheral edema 06/13/2011  . Asthma 06/13/2011  . Depression 06/13/2011  . Neuropathy 06/13/2011  . Cervical radiculopathy 06/13/2011  . Gout 06/13/2011  . Obesity hypoventilation syndrome 06/13/2011  . COPD (chronic obstructive pulmonary disease)     on O2  . Breast cancer dx'd 2005    left  . Cellulitis     hx of cellulitis in left leg  . Impaired glucose tolerance 11/01/2012  . PFO (patent foramen ovale)   . Atrial flutter   . Syncope   . Atrial fibrillation      Surgical History:  Past Surgical History  Procedure Laterality Date  . Ovarian cyst removal    . Breast lumpectomy    . Cervical disc surgery      have cadaver bones,, screws, and plates   . Cervical biopsy  June 2013    at [facility] for Heavy  Bleeding  . Dilation and curettage of uterus    . Colonoscopy    . Laparoscopic appendectomy  03/29/2012    Procedure: APPENDECTOMY LAPAROSCOPIC;  Surgeon: Adin Hector, MD;  Location: [facility];  Service: General;  Laterality: N/A;  . Tee without cardioversion N/A 03/15/2013    Procedure: TRANSESOPHAGEAL ECHOCARDIOGRAM (TEE);  Surgeon: Thayer Headings, MD;  Location: [place];  Service: Cardiovascular;  Laterality: N/A;  . Cardioversion N/A 03/15/2013  Procedure: CARDIOVERSION;  Surgeon: Thayer Headings, MD;  Location: [facility] ENDOSCOPY;  Service: Cardiovascular;  Laterality: N/A;  . Loop recorder implant  08-28-2013    MDT LinQ implanted by Dr Rayann Heman for syncope     Prescriptions prior to admission  Medication Sig Dispense Refill  . acetaminophen (TYLENOL) 325 MG tablet Take 650 mg by mouth every 6 (six) hours as needed (cramps).      [name] albuterol (PROVENTIL HFA;VENTOLIN HFA) 108 (90 BASE) MCG/ACT inhaler Inhale 2 puffs into the lungs every 6 (six) hours as needed for wheezing.      [name] allopurinol (ZYLOPRIM) 300 MG tablet Take 1  tablet (300 mg total) by mouth daily.  90 tablet  3  . amitriptyline (ELAVIL) 100 MG tablet Take 100 mg by mouth at bedtime.      [name] apixaban (ELIQUIS) 5 MG TABS tablet Take 1 tablet (5 mg total) by mouth 2 (two) times daily.  180 tablet  3  . cefUROXime (CEFTIN) 500 MG tablet Take 1 tablet (500 mg total) by mouth 2 (two) times daily with a meal.  5 tablet  0  . Cholecalciferol (VITAMIN D) 2000 UNITS tablet Take 2,000 Units by mouth at bedtime.      . DULoxetine (CYMBALTA) 30 MG capsule Take 60 mg by mouth at bedtime.       . ferrous sulfate 325 (65 FE) MG tablet Take 325 mg by mouth daily with breakfast.      . furosemide (LASIX) 40 MG tablet Take 40 mg by mouth See admin instructions. Take 1 tablet every morning then 1 tablet every other day at night      . levothyroxine (SYNTHROID, LEVOTHROID) 200 MCG tablet Take 1 tablet (200 mcg total) by mouth daily.  90 tablet  3  . metoprolol tartrate (LOPRESSOR) 25 MG tablet Take 1 tablet (25 mg total) by mouth 2 (two) times daily.  60 tablet  0  . Omega-3 Fatty Acids (FISH OIL PO) Take 1 capsule by mouth daily.       . potassium chloride SA (K-DUR,KLOR-CON) 20 MEQ tablet Take 20 mEq by mouth 2 (two) times daily.      . vitamin C (ASCORBIC ACID) 500 MG tablet Take 500 mg by mouth at bedtime.        Inpatient Medications:  . allopurinol  300 mg Oral Daily  . cefUROXime  500 mg Oral BID WC  . cholecalciferol  2,000 Units Oral Daily  . DULoxetine  60 mg Oral QHS  . enoxaparin (LOVENOX) injection  40 mg Subcutaneous Q24H  . ferrous sulfate  325 mg Oral Q breakfast  . levothyroxine  200 mcg Oral QAC breakfast  . omega-3 acid ethyl esters  1 g Oral Daily  . vitamin C  500 mg Oral QHS    Allergies:  Allergies  Allergen Reactions  . Codeine     REACTION: unknown - pt. states unaware of having reaction to codeine  . Lisinopril     REACTION: cough    History   Social History  . Marital Status: Divorced    Spouse Name: N/A    Number of Children:  N/A  . Years of Education: N/A   Occupational History  . Retired    Social History Main Topics  . Smoking status: Former Smoker -- 1.00 packs/day for 20 years    Types: Cigarettes    Quit date: 09/20/1998  . Smokeless tobacco: Never Used     Comment: 1ppd x 20 years  .  Alcohol Use: 0.6 oz/week    1 Glasses of wine per week     Comment: rare  . Drug Use: No  . Sexual Activity: Not Currently    Birth Control/ Protection: Post-menopausal   Other Topics Concern  . Not on file   Social History Narrative   Lives alone in an apartment.     Family History  Problem Relation Age of Onset  . Breast cancer    . Uterine cancer Mother   . Cancer Mother     Theadora Rama  . Heart disease Mother   . Cancer Sister 64    breast  . Diabetes Brother   . Cancer Maternal Aunt     breast  . Cancer Cousin 40    breast    Physical Exam: Filed Vitals:   08/31/13 0000 08/31/13 0206 08/31/13 0400 08/31/13 0736  BP: 113/46  120/63 87/49  Pulse: 95  96 99  Temp: 98.6 F (37 C)  98.3 F (36.8 C) 98.3 F (36.8 C)  TempSrc: Oral  Oral Oral  Resp: 17  29 24   Height:      Weight:      SpO2: 100% 100% 99% 99%    GEN- The patient is overweight appearing, alert and oriented x 3 today.   Head-ecchymosis and hematoma over the R forehead and face s/p syncope Eyes-  Sclera clear, conjunctiva pink Ears- hearing intact Oropharynx- clear Neck- supple,  Lungs- Clear to ausculation bilaterally, normal work of breathing Heart- Regular rate and rhythm, no murmurs, rubs or gallops, PMI not laterally displaced GI- soft, NT, ND, + BS Extremities- no clubbing, cyanosis, or edema MS- no significant deformity or atrophy Skin- no rash or lesion Psych- euthymic mood, full affect Neuro- strength and sensation are intact  Labs:   Lab Results  Component Value Date   WBC 7.3 08/30/2013   HGB 11.7* 08/30/2013   HCT 37.0 08/30/2013   MCV 91.8 08/30/2013   PLT 214 08/30/2013    Recent Labs Lab  08/27/13 0130 08/30/13 1540 08/30/13 2253  NA 141 140  --   K 4.0 4.1  --   CL 103 100  --   CO2 28 27  --   BUN 11 10  --   CREATININE 0.95 0.89 0.92  CALCIUM 9.3 9.5  --   PROT 7.2  --   --   BILITOT 0.6  --   --   ALKPHOS 74  --   --   ALT 32  --   --   AST 35  --   --   GLUCOSE 178* 85  --    Lab Results  Component Value Date   CKTOTAL 202* 03/28/2012   CKMB 2.7 03/28/2012   TROPONINI <0.30 08/30/2013    Radiology/Studies: Dg Chest 2 View 08/27/2013   CLINICAL DATA:  Syncope  EXAM: CHEST  2 VIEW  COMPARISON:  05/06/2013  FINDINGS: Cardiac enlargement without heart failure. Lungs are clear without infiltrate effusion or mass. No change from the prior study.  IMPRESSION: No active cardiopulmonary disease.   Electronically Signed   By: Franchot Gallo M.D.   On: 08/27/2013 16:15   Dg Ankle Complete Left 08/30/2013   CLINICAL DATA:  Left ankle pain.  EXAM: LEFT ANKLE COMPLETE - 3+ VIEW  COMPARISON:  None.  FINDINGS: There is no evidence of fracture, dislocation, or joint effusion. There is no evidence of arthropathy or other focal bone abnormality. Soft tissues are unremarkable.  IMPRESSION: Negative.  Electronically Signed   By: Kathreen Devoid   On: 08/30/2013 16:45   Ct Head Wo Contrast 08/30/2013   CLINICAL DATA:  Loss of consciousness.  Fall.  Frontal hematoma.  EXAM: CT HEAD WITHOUT CONTRAST  TECHNIQUE: Contiguous axial images were obtained from the base of the skull through the vertex without intravenous contrast.  COMPARISON:  08/26/2013  FINDINGS: There is a large right frontal scalp hematoma, increased in size compared to the prior study. No skull fracture is identified. There is no evidence of acute cortical infarct, mass, midline shift, intracranial hemorrhage, or extra-axial fluid collection. Ventricles and sulci are within normal limits for age. Orbits are unremarkable. Visualized paranasal sinuses and mastoid air cells are clear.  IMPRESSION: Large right frontal scalp hematoma. No  evidence of skull fracture or intracranial hemorrhage.   Electronically Signed   By: Logan Bores   On: 08/30/2013 16:15   QTT:CNGFR rhythm with PAC, rate 99, normal intervals  TELEMETRY: sinus rhythm, no arrythmias  DEVICE HISTORY: MDT LinQ implanted 08-28-2013 by Dr Rayann Heman for syncope  Assessment and Plan:  1. Syncope The patient has recurrent episodic syncope.  She had syncope yesterday related to a 10 second sinus pause.  We had a long discussion today about her syncope.  She has likely a vagal etiology though this is irreversible and associated with documented 10 second pauses.  There is data to support pacing in this situation, though if she also has vasodilatory components to her syncope, pacing may not completely resolve her episodes. I would recommend pacemaker implantation at this time.  Risks, benefits, alternatives to pacemaker implantation were discussed in detail with the patient today. The patient understands that the risks include but are not limited to bleeding, infection, pneumothorax, perforation, tamponade, vascular damage, renal failure, MI, stroke, death,  and lead dislodgement and wishes to proceed. We will therefore schedule the procedure at the next available time.  I will remove her implantable loop recorder at the same time.  2. afib Stable No change required today eliquis is on hold for PPM  3. HTN Stable No change required today

## 2013-08-31 NOTE — Care Management Note (Signed)
    Page 1 of 1   08/31/2013     9:38:30 AM   CARE MANAGEMENT NOTE 08/31/2013  Patient:  [name] [name],[name] [name]   Account Number:  [id number]  Date Initiated:  08/31/2013  Documentation initiated by:  Elissa Hefty  Subjective/Objective Assessment:   adm w syncope, pauses     Action/Plan:   lives alone, pcp dr [name]   Anticipated DC Date:     Anticipated DC Plan:           Choice offered to / List presented to:             Status of service:   Medicare Important Message given?   (If response is "NO", the following Medicare IM given date fields will be blank) Date Medicare IM given:   Date Additional Medicare IM given:    Discharge Disposition:    Per UR Regulation:  Reviewed for med. necessity/level of care/duration of stay  If discussed at [place] of Stay Meetings, dates discussed:    Comments:

## 2013-08-31 NOTE — Consult Note (Signed)
ELECTROPHYSIOLOGY CONSULT NOTE    Patient ID: [name] [name] MRN: [id number], DOB/AGE: 04/25/1946 67 y.o.  Admit date: 08/30/2013 Date of Consult: 08-31-2013  Primary Physician: [name], MD Primary Cardiologist: Kirk Ruths, MD Electrophysiologist: Thompson Grayer, MD  Reason for Consultation: syncope  HPI:  [name] is a 67 year old female with a past medical history significant for hypertension, hyperlipidemia, nonobstructive coronary disease, sleep apnea (on CPAP), hypothyroidism, atrial fibrillation, asthma, and peripheral neuropathy.  She was discharged yesterday after a syncopal episode on Sunday and her work up was negative. She was monitored on telemetry which demonstrated no arrhythmias.  A loop recorder was placed 08-28-2013 prior to discharge.    Yesterday, she woke up, got up and went into the bathroom. She urinated and was sitting on the commode waiting for the water in the sink to warm up.  She then had a sudden syncopal spell and fell and hit her head.  She called 911 and was brought to [facility] for further evaluation. Interrogation of her loop recorder demonstrated a period of bradycardia at the time of her syncopal spell.  There was R-R prolongation which preceded the bradycardia. There was another episode of bradycardia the night before with the same R-R prolongation.  The patient was sleeping at the time and had no symptoms.  She was diagnosed with a UTI during her last admission and is currently on antibiotics.   She denies chest pain, shortness of breath, pre-syncope, or palpitations.  She reports no neurologic symptoms related to her fall.     EP has been asked to evaluate for treatment options.    ROS is negative except as outlined above.   Past Medical History  Diagnosis Date  . Hypertension   . Hyperlipidemia   . Anemia   . CAD (coronary artery disease)     a. Nonobstructive CAD 2007 (EF 75%.  RCA  proximal 75% tubular, 25% prox LAD, 25% d1, 50% D2) at [facility].  b. NSTEMI 01/2010 in setting of respiratory failure, medical approach (not a good candidate for noninvasive eval)  . OSA on CPAP   . Hypothyroidism   . Peripheral edema 06/13/2011  . Asthma 06/13/2011  . Depression 06/13/2011  . Neuropathy 06/13/2011  . Cervical radiculopathy 06/13/2011  . Gout 06/13/2011  . Obesity hypoventilation syndrome 06/13/2011  . COPD (chronic obstructive pulmonary disease)     on O2  . Breast cancer dx'd 2005    left  . Cellulitis     hx of cellulitis in left leg  . Impaired glucose tolerance 11/01/2012  . PFO (patent foramen ovale)   . Atrial flutter   . Syncope   . Atrial fibrillation      Surgical History:  Past Surgical History  Procedure Laterality Date  . Ovarian cyst removal    . Breast lumpectomy    . Cervical disc surgery      have cadaver bones,, screws, and plates   . Cervical biopsy  June 2013    at [facility] for Heavy  Bleeding  . Dilation and curettage of uterus    . Colonoscopy    . Laparoscopic appendectomy  03/29/2012    Procedure: APPENDECTOMY LAPAROSCOPIC;  Surgeon: Adin Hector, MD;  Location: [facility];  Service: General;  Laterality: N/A;  . Tee without cardioversion N/A 03/15/2013    Procedure: TRANSESOPHAGEAL ECHOCARDIOGRAM (TEE);  Surgeon: Thayer Headings, MD;  Location: [place];  Service: Cardiovascular;  Laterality: N/A;  . Cardioversion N/A 03/15/2013  Procedure: CARDIOVERSION;  Surgeon: Thayer Headings, MD;  Location: [facility] ENDOSCOPY;  Service: Cardiovascular;  Laterality: N/A;  . Loop recorder implant  08-28-2013    MDT LinQ implanted by Dr Rayann Heman for syncope     Prescriptions prior to admission  Medication Sig Dispense Refill  . acetaminophen (TYLENOL) 325 MG tablet Take 650 mg by mouth every 6 (six) hours as needed (cramps).      [name] albuterol (PROVENTIL HFA;VENTOLIN HFA) 108 (90 BASE) MCG/ACT inhaler Inhale 2 puffs into the lungs every 6 (six) hours as needed for wheezing.      [name] allopurinol (ZYLOPRIM) 300 MG tablet Take 1  tablet (300 mg total) by mouth daily.  90 tablet  3  . amitriptyline (ELAVIL) 100 MG tablet Take 100 mg by mouth at bedtime.      [name] apixaban (ELIQUIS) 5 MG TABS tablet Take 1 tablet (5 mg total) by mouth 2 (two) times daily.  180 tablet  3  . cefUROXime (CEFTIN) 500 MG tablet Take 1 tablet (500 mg total) by mouth 2 (two) times daily with a meal.  5 tablet  0  . Cholecalciferol (VITAMIN D) 2000 UNITS tablet Take 2,000 Units by mouth at bedtime.      . DULoxetine (CYMBALTA) 30 MG capsule Take 60 mg by mouth at bedtime.       . ferrous sulfate 325 (65 FE) MG tablet Take 325 mg by mouth daily with breakfast.      . furosemide (LASIX) 40 MG tablet Take 40 mg by mouth See admin instructions. Take 1 tablet every morning then 1 tablet every other day at night      . levothyroxine (SYNTHROID, LEVOTHROID) 200 MCG tablet Take 1 tablet (200 mcg total) by mouth daily.  90 tablet  3  . metoprolol tartrate (LOPRESSOR) 25 MG tablet Take 1 tablet (25 mg total) by mouth 2 (two) times daily.  60 tablet  0  . Omega-3 Fatty Acids (FISH OIL PO) Take 1 capsule by mouth daily.       . potassium chloride SA (K-DUR,KLOR-CON) 20 MEQ tablet Take 20 mEq by mouth 2 (two) times daily.      . vitamin C (ASCORBIC ACID) 500 MG tablet Take 500 mg by mouth at bedtime.        Inpatient Medications:  . allopurinol  300 mg Oral Daily  . cefUROXime  500 mg Oral BID WC  . cholecalciferol  2,000 Units Oral Daily  . DULoxetine  60 mg Oral QHS  . enoxaparin (LOVENOX) injection  40 mg Subcutaneous Q24H  . ferrous sulfate  325 mg Oral Q breakfast  . levothyroxine  200 mcg Oral QAC breakfast  . omega-3 acid ethyl esters  1 g Oral Daily  . vitamin C  500 mg Oral QHS    Allergies:  Allergies  Allergen Reactions  . Codeine     REACTION: unknown - pt. states unaware of having reaction to codeine  . Lisinopril     REACTION: cough    History   Social History  . Marital Status: Divorced    Spouse Name: N/A    Number of Children:  N/A  . Years of Education: N/A   Occupational History  . Retired    Social History Main Topics  . Smoking status: Former Smoker -- 1.00 packs/day for 20 years    Types: Cigarettes    Quit date: 09/20/1998  . Smokeless tobacco: Never Used     Comment: 1ppd x 20 years  .  Alcohol Use: 0.6 oz/week    1 Glasses of wine per week     Comment: rare  . Drug Use: No  . Sexual Activity: Not Currently    Birth Control/ Protection: Post-menopausal   Other Topics Concern  . Not on file   Social History Narrative   Lives alone in an apartment.     Family History  Problem Relation Age of Onset  . Breast cancer    . Uterine cancer Mother   . Cancer Mother     Theadora Rama  . Heart disease Mother   . Cancer Sister 47    breast  . Diabetes Brother   . Cancer Maternal Aunt     breast  . Cancer Cousin 40    breast    Physical Exam: Filed Vitals:   08/31/13 0000 08/31/13 0206 08/31/13 0400 08/31/13 0736  BP: 113/46  120/63 87/49  Pulse: 95  96 99  Temp: 98.6 F (37 C)  98.3 F (36.8 C) 98.3 F (36.8 C)  TempSrc: Oral  Oral Oral  Resp: 17  29 24   Height:      Weight:      SpO2: 100% 100% 99% 99%    GEN- The patient is overweight appearing, alert and oriented x 3 today.   Head-ecchymosis and hematoma over the R forehead and face s/p syncope Eyes-  Sclera clear, conjunctiva pink Ears- hearing intact Oropharynx- clear Neck- supple,  Lungs- Clear to ausculation bilaterally, normal work of breathing Heart- Regular rate and rhythm, no murmurs, rubs or gallops, PMI not laterally displaced GI- soft, NT, ND, + BS Extremities- no clubbing, cyanosis, or edema MS- no significant deformity or atrophy Skin- no rash or lesion Psych- euthymic mood, full affect Neuro- strength and sensation are intact  Labs:   Lab Results  Component Value Date   WBC 7.3 08/30/2013   HGB 11.7* 08/30/2013   HCT 37.0 08/30/2013   MCV 91.8 08/30/2013   PLT 214 08/30/2013    Recent Labs Lab  08/27/13 0130 08/30/13 1540 08/30/13 2253  NA 141 140  --   K 4.0 4.1  --   CL 103 100  --   CO2 28 27  --   BUN 11 10  --   CREATININE 0.95 0.89 0.92  CALCIUM 9.3 9.5  --   PROT 7.2  --   --   BILITOT 0.6  --   --   ALKPHOS 74  --   --   ALT 32  --   --   AST 35  --   --   GLUCOSE 178* 85  --    Lab Results  Component Value Date   CKTOTAL 202* 03/28/2012   CKMB 2.7 03/28/2012   TROPONINI <0.30 08/30/2013    Radiology/Studies: Dg Chest 2 View 08/27/2013   CLINICAL DATA:  Syncope  EXAM: CHEST  2 VIEW  COMPARISON:  05/06/2013  FINDINGS: Cardiac enlargement without heart failure. Lungs are clear without infiltrate effusion or mass. No change from the prior study.  IMPRESSION: No active cardiopulmonary disease.   Electronically Signed   By: Franchot Gallo M.D.   On: 08/27/2013 16:15   Dg Ankle Complete Left 08/30/2013   CLINICAL DATA:  Left ankle pain.  EXAM: LEFT ANKLE COMPLETE - 3+ VIEW  COMPARISON:  None.  FINDINGS: There is no evidence of fracture, dislocation, or joint effusion. There is no evidence of arthropathy or other focal bone abnormality. Soft tissues are unremarkable.  IMPRESSION: Negative.  Electronically Signed   By: Kathreen Devoid   On: 08/30/2013 16:45   Ct Head Wo Contrast 08/30/2013   CLINICAL DATA:  Loss of consciousness.  Fall.  Frontal hematoma.  EXAM: CT HEAD WITHOUT CONTRAST  TECHNIQUE: Contiguous axial images were obtained from the base of the skull through the vertex without intravenous contrast.  COMPARISON:  08/26/2013  FINDINGS: There is a large right frontal scalp hematoma, increased in size compared to the prior study. No skull fracture is identified. There is no evidence of acute cortical infarct, mass, midline shift, intracranial hemorrhage, or extra-axial fluid collection. Ventricles and sulci are within normal limits for age. Orbits are unremarkable. Visualized paranasal sinuses and mastoid air cells are clear.  IMPRESSION: Large right frontal scalp hematoma. No  evidence of skull fracture or intracranial hemorrhage.   Electronically Signed   By: Logan Bores   On: 08/30/2013 16:15   UPJ:SRPRX rhythm with PAC, rate 99, normal intervals  TELEMETRY: sinus rhythm, no arrythmias  DEVICE HISTORY: MDT LinQ implanted 08-28-2013 by Dr Rayann Heman for syncope  Assessment and Plan:  1. Syncope The patient has recurrent episodic syncope.  She had syncope yesterday related to a 10 second sinus pause.  We had a long discussion today about her syncope.  She has likely a vagal etiology though this is irreversible and associated with documented 10 second pauses.  There is data to support pacing in this situation, though if she also has vasodilatory components to her syncope, pacing may not completely resolve her episodes. I would recommend pacemaker implantation at this time.  Risks, benefits, alternatives to pacemaker implantation were discussed in detail with the patient today. The patient understands that the risks include but are not limited to bleeding, infection, pneumothorax, perforation, tamponade, vascular damage, renal failure, MI, stroke, death,  and lead dislodgement and wishes to proceed. We will therefore schedule the procedure at the next available time.  I will remove her implantable loop recorder at the same time.  2. afib Stable No change required today eliquis is on hold for PPM  3. HTN Stable No change required today

## 2013-08-31 NOTE — Op Note (Signed)
SURGEON:  Thompson Grayer, MD     PREPROCEDURE DIAGNOSIS:  Symptomatic sinus bradycardia and syncope    POSTPROCEDURE DIAGNOSIS:  Symptomatic sinus bradycardia and syncope     PROCEDURES:   1.  Pacemaker implantation.   2.  Implantable loop recorder removal    INTRODUCTION: [name] [name] is a 67 y.o. female  with a history of symptomatic sinus bradycardia and syncope who presents today for pacemaker implantation.  The patient reports intermittent episodes of syncope over the past few months.  An implantable loop recorder was placed which documented sinus bradycardia with 10 second pauses at the time of syncope.  No reversible causes have been identified.  The patient therefore presents today for pacemaker implantation.     DESCRIPTION OF PROCEDURE:  Informed written consent was obtained, and the patient was brought to the electrophysiology lab in a fasting state.  The patient required no sedation for the procedure today.  The patients left chest was prepped and draped in the usual sterile fashion by the EP lab staff. The skin overlying the left deltopectoral region was infiltrated with lidocaine for local analgesia.  A 4-cm incision was made over the left deltopectoral region.  A left subcutaneous pacemaker pocket was fashioned using a combination of sharp and blunt dissection. Electrocautery was required to assure hemostasis.    RA/RV Lead Placement: The left axillary vein was therefore cannulated.  No contrast was required for the procedure today.  Through the left axillary vein, a Biotronik model Setrox S-53 (serial number [id number]) right atrial lead and a biotronik model Selox ST 60 (serial number [id number]) right ventricular lead were advanced   with fluoroscopic visualization into the right atrial appendage and right ventricular apex positions respectively.  Initial atrial lead P- waves measured 5 mV with impedance of 470 ohms and a threshold of 0.9 V at 0.5 msec.  Right ventricular lead R-waves  measured 15 mV with an impedance of 1335 ohms and a threshold of 0.2 V at 0.5 msec.  Both leads were secured to the pectoralis fascia using #2-0 silk over the suture sleeves.   Device Placement:  The leads were then connected to a Biotronik Evia DR-T (serial number [id number]) pacemaker.  The pocket was irrigated with copious gentamicin solution.  The pacemaker was then placed into the pocket.  The pocket was then closed in 2 layers with 2.0 Vicryl suture for the subcutaneous and subcuticular layers.  Steri- Strips and a sterile dressing were then applied.  There were no early apparent complications.   Implantable loop recorder removal:  The ILR implant site was prepped and draped in a standard fashion.  A 0.5cm incision was made at the ILR site.  Using a hemostat, the ILR (Medtronic LINQ) was removed with ease.  Steri- Strips and a sterile dressing were then applied.  There were no early apparent complications.   CONCLUSIONS:   1. Successful implantation of a Biotronik Evia Dr-T dual-chamber pacemaker for symptomatic sinus bradycardia and pauses with syncope.  As there has been data suggesting that Closed Loop Stimulation (CLS- a Biotronik feature) provides better treatment for syncope in this patient population (those with vagal components as well), I used a Biotronik device for the procedure today.  2. No early apparent complications.       Thompson Grayer, MD 08/31/2013 7:28 PM

## 2013-09-01 ENCOUNTER — Inpatient Hospital Stay (HOSPITAL_COMMUNITY): Payer: Medicare Other

## 2013-09-01 ENCOUNTER — Encounter (HOSPITAL_COMMUNITY): Payer: Self-pay | Admitting: Adult Health

## 2013-09-01 DIAGNOSIS — N390 Urinary tract infection, site not specified: Secondary | ICD-10-CM | POA: Diagnosis not present

## 2013-09-01 DIAGNOSIS — E662 Morbid (severe) obesity with alveolar hypoventilation: Secondary | ICD-10-CM | POA: Diagnosis not present

## 2013-09-01 DIAGNOSIS — Z6841 Body Mass Index (BMI) 40.0 and over, adult: Secondary | ICD-10-CM | POA: Diagnosis not present

## 2013-09-01 DIAGNOSIS — I498 Other specified cardiac arrhythmias: Secondary | ICD-10-CM | POA: Diagnosis not present

## 2013-09-01 DIAGNOSIS — R55 Syncope and collapse: Secondary | ICD-10-CM | POA: Diagnosis not present

## 2013-09-01 DIAGNOSIS — I495 Sick sinus syndrome: Secondary | ICD-10-CM | POA: Diagnosis not present

## 2013-09-01 DIAGNOSIS — R918 Other nonspecific abnormal finding of lung field: Secondary | ICD-10-CM | POA: Diagnosis not present

## 2013-09-01 NOTE — Discharge Summary (Signed)
Physician Discharge Summary  Patient ID: [name] [name] MRN: [id number] DOB/AGE: 25-Jul-1946 67 y.o.  Admit date: 08/30/2013 Discharge date: 09/01/2013  Primary Discharge Diagnosis: 1. Syncope and Collapse 2. S/P Biotronik Evia DR-T (serial number [id number]) pacemaker 08/30/2013  Secondary Discharge Diagnosis: 1. CAD 2.Atrial flutter 3. OSA  Significant Diagnostic Studies: 1. Pacemaker Implantation 1. Successful implantation of a Biotronik Evia Dr-T dual-chamber pacemaker for symptomatic sinus bradycardia and pauses with syncope. As there has been data suggesting that Closed Loop Stimulation (CLS- a Biotronik feature) provides better treatment for syncope in this patient population (those with vagal components as well), I used a Biotronik device for the procedure today.   Consults:  Dr. Rayann Heman [facility] Course: [name] [name] is a 67 year old female patient with history of nonobstructive CAD, such as sleep apnea, hypothyroidism and atrial flutter, who was admitted on 08/30/2013 with syncope. This initially occurs when she is emotionally stressed. However on day of admission while sitting on the commode she is similar prodrome followed by repeat episode of syncope and striking her head. She sustained a gallop hematoma but no intracranial bleed. The patient did have a loop recorder placed on 08/28/2013, with interrogation demonstrating. Bradycardia at the time of syncopal. There was R. to R. prolongation. As a result of symptomatic bradycardia with pauses the patient was planned for a pacemaker implantation.   On 08/30/2013 a Biotronik Evia DR-T. dual-chamber pacemaker was inserted by Dr. Rayann Heman with removal of loop recorder. The patient tolerated the procedure well without evidence of hematoma bleeding or infection. The pacemaker was interrogated a.m. of discharge after being seen by Dr. Rayann Heman, and found to be functioning appropriately. The patient was found to be stable to return home. She  will followup with a wound check in 10 days and with Dr. Rayann Heman in 3 months. No new medications were prescribed. Verbal and written instructions concerning post pacemaker activity and wound care were provided.     Discharge Exam: Blood pressure 139/75, pulse 93, temperature 98.5 F (36.9 C), temperature source Oral, resp. rate 20, height 5' 5.5" (1.664 m), weight 355 lb 12.8 oz (161.39 kg), SpO2 94.00%.    Labs:   Lab Results  Component Value Date   WBC 7.3 08/30/2013   HGB 11.7* 08/30/2013   HCT 37.0 08/30/2013   MCV 91.8 08/30/2013   PLT 214 08/30/2013    Recent Labs Lab 08/27/13 0130 08/30/13 1540 08/30/13 2253  NA 141 140  --   K 4.0 4.1  --   CL 103 100  --   CO2 28 27  --   BUN 11 10  --   CREATININE 0.95 0.89 0.92  CALCIUM 9.3 9.5  --   PROT 7.2  --   --   BILITOT 0.6  --   --   ALKPHOS 74  --   --   ALT 32  --   --   AST 35  --   --   GLUCOSE 178* 85  --    Lab Results  Component Value Date   CKTOTAL 202* 03/28/2012   CKMB 2.7 03/28/2012   TROPONINI <0.30 08/30/2013    Lab Results  Component Value Date   CHOL 160 10/05/2012   CHOL 174 12/28/2011   CHOL  Value: 115        ATP III CLASSIFICATION:  <200     mg/dL   Desirable  200-239  mg/dL   Borderline High  >=240    mg/dL   High  02/13/2010   Lab Results  Component Value Date   HDL 48.30 10/05/2012   HDL 46.30 12/28/2011   HDL 45 02/13/2010   Lab Results  Component Value Date   LDLCALC 89 10/05/2012   LDLCALC 96 12/28/2011   LDLCALC  Value: 51        Total Cholesterol/HDL:CHD Risk Coronary Heart Disease Risk Table                     Men   Women  1/2 Average Risk   3.4   3.3  Average Risk       5.0   4.4  2 X Average Risk   9.6   7.1  3 X Average Risk  23.4   11.0        Use the calculated Patient Ratio above and the CHD Risk Table to determine the patient's CHD Risk.        ATP III CLASSIFICATION (LDL):  <100     mg/dL   Optimal  100-129  mg/dL   Near or Above                    Optimal  130-159  mg/dL    Borderline  160-189  mg/dL   High  >190     mg/dL   Very High 02/13/2010   Lab Results  Component Value Date   TRIG 116.0 10/05/2012   TRIG 159.0* 12/28/2011   TRIG 93 02/13/2010   Lab Results  Component Value Date   CHOLHDL 3 10/05/2012   CHOLHDL 4 12/28/2011   CHOLHDL 2.6 02/13/2010   No results found for this basename: LDLDIRECT      Radiology: Dg Chest 2 View  09/01/2013   CLINICAL DATA:  Status post ICD placement  EXAM: CHEST  2 VIEW  COMPARISON:  Chest CT, 08/28/2013 and chest radiograph, 08/27/2013  FINDINGS: New left anterior chest wall sequential pacemaker has its leads well-positioned in the right atrium and right ventricle.  Cardiac silhouette is mildly enlarged. The mediastinum is normal in contour.  Lungs are mildly hyperexpanded with central vascular prominence. Lungs are clear. No pleural effusion or pneumothorax.  IMPRESSION: New left anterior chest wall sequential pacemaker is well positioned. No pneumothorax. No acute findings.   Electronically Signed   By: Lajean Manes M.D.   On: 09/01/2013 07:48   FOLLOW UP PLANS AND APPOINTMENTS Discharge Orders   Future Appointments Provider Department Dept Phone   09/03/2013 9:00 AM Lelon Perla, MD [place] Office [phone]   09/05/2013 4:45 PM Biagio Borg, MD [facility] [place] [phone]   09/06/2013 2:00 PM [facility] [place] Office [phone]   11/06/2013 9:15 AM Biagio Borg, MD [facility] [phone]   12/26/2013 9:30 AM [facility] [place] ONCOLOGY [phone]   12/26/2013 10:00 AM [facility] Provider [place] [phone]   12/26/2013 11:15 AM [facility] (Chest) [facility] [phone]   Future Orders Complete By Expires   Diet - low sodium heart healthy  As directed    Discharge wound care:  As directed    Comments:      Supplemental Discharge Instructions for  Pacemaker/Defibrillator Patients  Activity No heavy lifting or vigorous activity with your left/right arm for 6 to 8 weeks.  Do not raise your left/right arm above your head for one week.  Gradually raise your affected arm as drawn below.  NO DRIVING for 1 wk ; you may begin driving on   52/77/8242  .  WOUND CARE Keep the wound area clean and dry.  Do not get this area wet for one week. No showers for one week; you may shower on  09/09/2103   . The tape/steri-strips on your wound will fall off; do not pull them off.  No bandage is needed on the site.  DO  NOT apply any creams, oils, or ointments to the wound area. If you notice any drainage or discharge from the wound, any swelling or bruising at the site, or you develop a fever > 101? F after you are discharged home, call the office at once.  Special Instructions You are still able to use cellular telephones; use the ear opposite the side where you have your pacemaker/defibrillator.  Avoid carrying your cellular phone near your device. When traveling through airports, show security personnel your identification card to avoid being screened in the metal detectors.  Ask the security personnel to use the hand wand. Avoid arc welding equipment, MRI testing (magnetic resonance imaging), TENS units (transcutaneous nerve stimulators).  Call the office for questions about other devices. Avoid electrical appliances that are in poor condition or are not properly grounded. Microwave ovens are safe to be near or to operate.   DO NOT DRIVE YOURSELF OR A FAMILY MEMBER WITH A DEFIBRILLATOR TO THE HOSPITAL-CALL 911.   Increase activity slowly  As directed        Medication List    TAKE these medications       acetaminophen 325 MG tablet  Commonly known as:  TYLENOL  Take 650 mg by mouth every 6 (six) hours as needed (cramps).     albuterol 108 (90 BASE) MCG/ACT inhaler  Commonly known as:  PROVENTIL  HFA;VENTOLIN HFA  Inhale 2 puffs into the lungs every 6 (six) hours as needed for wheezing.     allopurinol 300 MG tablet  Commonly known as:  ZYLOPRIM  Take 1 tablet (300 mg total) by mouth daily.     amitriptyline 100 MG tablet  Commonly known as:  ELAVIL  Take 100 mg by mouth at bedtime.     apixaban 5 MG Tabs tablet  Commonly known as:  ELIQUIS  Take 1 tablet (5 mg total) by mouth 2 (two) times daily.     cefUROXime 500 MG tablet  Commonly known as:  CEFTIN  Take 1 tablet (500 mg total) by mouth 2 (two) times daily with a meal.     DULoxetine 30 MG capsule  Commonly known as:  CYMBALTA  Take 60 mg by mouth at bedtime.     ferrous sulfate 325 (65 FE) MG tablet  Take 325 mg by mouth daily with breakfast.     FISH OIL PO  Take 1 capsule by mouth daily.     furosemide 40 MG tablet  Commonly known as:  LASIX  Take 40 mg by mouth See admin instructions. Take 1 tablet every morning then 1 tablet every other day at night     levothyroxine 200 MCG tablet  Commonly known as:  SYNTHROID, LEVOTHROID  Take 1 tablet (200 mcg total) by mouth daily.     potassium chloride SA 20 MEQ tablet  Commonly known as:  K-DUR,KLOR-CON  Take 20 mEq by mouth 2 (two) times daily.     vitamin C 500 MG tablet  Commonly known as:  ASCORBIC ACID  Take 500 mg by mouth at bedtime.     Vitamin D 2000 UNITS tablet  Take 2,000 Units by mouth at bedtime.      ASK your doctor about these medications       metoprolol tartrate 25 MG tablet  Commonly known as:  LOPRESSOR  Take 1 tablet (25 mg total) by mouth 2 (two) times daily.           Follow-up Information   Follow up with [facility] On 09/06/2013. (Wound check appt. 2 pm)    Specialty:  Electrophysiology   Contact information:   [address] [place] [address] [phone]      Follow up with Thompson Grayer, MD. (Our office will call you for appt. )    Specialty:  Cardiology   Contact  information:   [place] [address] [phone]       Time spent with patient to include physician time: 30 minutes. Signed: Jory Sims 09/01/2013, 9:34 AM Co-Sign MD   Thompson Grayer MD

## 2013-09-01 NOTE — Progress Notes (Signed)
SUBJECTIVE: The patient is doing well today.  At this time, she denies chest pain, shortness of breath, or any new concerns.  [name] allopurinol  300 mg Oral Daily  .  ceFAZolin (ANCEF) IV  1 g Intravenous Q6H  . cefUROXime  500 mg Oral BID WC  . cholecalciferol  2,000 Units Oral Daily  . DULoxetine  60 mg Oral QHS  . ferrous sulfate  325 mg Oral Q breakfast  . levothyroxine  200 mcg Oral QAC breakfast  . omega-3 acid ethyl esters  1 g Oral Daily  . sodium chloride  3 mL Intravenous Q12H  . vitamin C  500 mg Oral QHS      OBJECTIVE: Physical Exam: Filed Vitals:   08/31/13 2230 08/31/13 2300 09/01/13 0001 09/01/13 0610  BP: 157/106 142/79 142/79 139/75  Pulse:    93  Temp:    98.5 F (36.9 C)  TempSrc:    Oral  Resp:    20  Height:      Weight:      SpO2:    94%    Intake/Output Summary (Last 24 hours) at 09/01/13 0836 Last data filed at 08/31/13 2310  Gross per 24 hour  Intake 524.67 ml  Output    600 ml  Net -75.33 ml    Telemetry reveals sinus rhythm  GEN- The patient is well appearing, alert and oriented x 3 today.   Head- hematoma/ ecchymosis are stable Eyes-  Sclera clear, conjunctiva pink Ears- hearing intact Oropharynx- clear Neck- supple, no JVP Lymph- no cervical lymphadenopathy Lungs- Clear to ausculation bilaterally, normal work of breathing Heart- Regular rate and rhythm  GI- soft, NT, ND, + BS Extremities- no clubbing, cyanosis, or edema Skin device pocket is without hematoma  LABS: Basic Metabolic Panel:  Recent Labs  08/30/13 1540 08/30/13 2253  NA 140  --   K 4.1  --   CL 100  --   CO2 27  --   GLUCOSE 85  --   BUN 10  --   CREATININE 0.89 0.92  CALCIUM 9.5  --    Liver Function Tests: No results found for this basename: AST, ALT, ALKPHOS, BILITOT, PROT, ALBUMIN,  in the last 72 hours No results found for this basename: LIPASE, AMYLASE,  in the last 72 hours CBC:  Recent Labs  08/30/13 1540 08/30/13 2253  WBC 7.7 7.3    NEUTROABS 4.9  --   HGB 12.9 11.7*  HCT 39.2 37.0  MCV 91.6 91.8  PLT 228 214   Cardiac Enzymes:  Recent Labs  08/30/13 1542  TROPONINI <0.30   BNP: No components found with this basename: POCBNP,  D-Dimer: No results found for this basename: DDIMER,  in the last 72 hours Hemoglobin A1C: No results found for this basename: HGBA1C,  in the last 72 hours Fasting Lipid Panel: No results found for this basename: CHOL, HDL, LDLCALC, TRIG, CHOLHDL, LDLDIRECT,  in the last 72 hours Thyroid Function Tests: No results found for this basename: TSH, T4TOTAL, FREET3, T3FREE, THYROIDAB,  in the last 72 hours Anemia Panel: No results found for this basename: VITAMINB12, FOLATE, FERRITIN, TIBC, IRON, RETICCTPCT,  in the last 72 hours  RADIOLOGY: Dg Chest 2 View  09/01/2013   CLINICAL DATA:  Status post ICD placement  EXAM: CHEST  2 VIEW  COMPARISON:  Chest CT, 08/28/2013 and chest radiograph, 08/27/2013  FINDINGS: New left anterior chest wall sequential pacemaker has its leads well-positioned in the right atrium and  right ventricle.  Cardiac silhouette is mildly enlarged. The mediastinum is normal in contour.  Lungs are mildly hyperexpanded with central vascular prominence. Lungs are clear. No pleural effusion or pneumothorax.  IMPRESSION: New left anterior chest wall sequential pacemaker is well positioned. No pneumothorax. No acute findings.   Electronically Signed   By: Lajean Manes M.D.   On: 09/01/2013 07:48   Dg Chest 2 View  08/27/2013   CLINICAL DATA:  Syncope  EXAM: CHEST  2 VIEW  COMPARISON:  05/06/2013  FINDINGS: Cardiac enlargement without heart failure. Lungs are clear without infiltrate effusion or mass. No change from the prior study.  IMPRESSION: No active cardiopulmonary disease.   Electronically Signed   By: Franchot Gallo M.D.   On: 08/27/2013 16:15   Dg Ankle Complete Left  08/30/2013   CLINICAL DATA:  Left ankle pain.  EXAM: LEFT ANKLE COMPLETE - 3+ VIEW  COMPARISON:   None.  FINDINGS: There is no evidence of fracture, dislocation, or joint effusion. There is no evidence of arthropathy or other focal bone abnormality. Soft tissues are unremarkable.  IMPRESSION: Negative.   Electronically Signed   By: Kathreen Devoid   On: 08/30/2013 16:45   Ct Head Wo Contrast  08/30/2013   CLINICAL DATA:  Loss of consciousness.  Fall.  Frontal hematoma.  EXAM: CT HEAD WITHOUT CONTRAST  TECHNIQUE: Contiguous axial images were obtained from the base of the skull through the vertex without intravenous contrast.  COMPARISON:  08/26/2013  FINDINGS: There is a large right frontal scalp hematoma, increased in size compared to the prior study. No skull fracture is identified. There is no evidence of acute cortical infarct, mass, midline shift, intracranial hemorrhage, or extra-axial fluid collection. Ventricles and sulci are within normal limits for age. Orbits are unremarkable. Visualized paranasal sinuses and mastoid air cells are clear.  IMPRESSION: Large right frontal scalp hematoma. No evidence of skull fracture or intracranial hemorrhage.   Electronically Signed   By: Logan Bores   On: 08/30/2013 16:15   Ct Head Wo Contrast  08/26/2013   CLINICAL DATA:  Syncope.  Headache.  EXAM: CT HEAD WITHOUT CONTRAST  TECHNIQUE: Contiguous axial images were obtained from the base of the skull through the vertex without intravenous contrast.  COMPARISON:  04/01/2012.  FINDINGS: Right frontal scalp hematoma. No skull fracture, intracranial hemorrhage or paranasal sinus air-fluid levels. Stable mildly enlarged ventricles and subarachnoid spaces.  IMPRESSION: 1. Right frontal scalp hematoma without skull fracture or intracranial hemorrhage. 2. Stable mild atrophy.   Electronically Signed   By: Enrique Sack M.D.   On: 08/26/2013 20:06   Ct Angio Chest Pe W/cm &/or Wo Cm  08/28/2013   CLINICAL DATA:  Shortness of breath and palpitations.  EXAM: CT ANGIOGRAPHY CHEST WITH CONTRAST  TECHNIQUE: Multidetector CT  imaging of the chest was performed using the standard protocol during bolus administration of intravenous contrast. Multiplanar CT image reconstructions including MIPs were obtained to evaluate the vascular anatomy. The study quality is degraded due to the patient's body habitus in contact of the body with the scanner gantry  CONTRAST:  156m OMNIPAQUE IOHEXOL 350 MG/ML SOLN  COMPARISON:  Chest x-ray dated August 27, 2013.  FINDINGS: Contrast within the pulmonary arterial tree is normal in appearance. There are no filling defects to suggest an acute pulmonary embolism. The caliber of the thoracic aorta is normal. There is no evidence of a false aortic lumen. The cardiac chambers are mildly enlarged. There is a small hiatal hernia.  The thoracic esophagus appears to be of normal caliber. No bulky mediastinal or hilar lymph nodes are evident. There is no pleural nor pericardial effusion.  At lung windows settings there is no alveolar infiltrate. There is minimal compressive atelectasis posteriorly in the lower lobes bilaterally. The thoracic vertebral bodies are preserved in height. The sternum appears intact. The observed portions of the ribs appear normal.  Within the upper abdomen the observed portions of the liver and spleen appear normal.  IMPRESSION:  1. There is no evidence of an acute pulmonary embolism nor acute thoracic aortic pathology. 2. There is enlargement of the cardiac chambers without overt evidence of pulmonary edema. 3. Is minimal compressive atelectasis posteriorly in both lungs. 4. The mediastinum demonstrates no lymphadenopathy. There is a small hiatal hernia.   Electronically Signed   By: David  Martinique   On: 08/28/2013 15:28    ASSESSMENT AND PLAN:  Active Problems:   Syncope   Syncope and collapse  1. Syncope with sinus bradycardia Doing well today s/p PPM CXR reveals stable leads and no ptx Device interrogation is reviewed and normal  Routine wound care and follow-up  Wound  check in 10 days Follow-up with me in 3 months  No driving x 6 months  (pt is aware and willing to comply)  Will discharge to home today   Thompson Grayer, MD 09/01/2013 8:36 AM

## 2013-09-03 ENCOUNTER — Ambulatory Visit (INDEPENDENT_AMBULATORY_CARE_PROVIDER_SITE_OTHER): Payer: Medicare Other | Admitting: Cardiology

## 2013-09-03 ENCOUNTER — Encounter (HOSPITAL_COMMUNITY): Payer: Self-pay | Admitting: *Deleted

## 2013-09-03 ENCOUNTER — Encounter: Payer: Self-pay | Admitting: Cardiology

## 2013-09-03 VITALS — BP 110/60 | HR 70 | Ht 65.5 in | Wt 354.0 lb

## 2013-09-03 DIAGNOSIS — I509 Heart failure, unspecified: Secondary | ICD-10-CM

## 2013-09-03 DIAGNOSIS — R55 Syncope and collapse: Secondary | ICD-10-CM

## 2013-09-03 DIAGNOSIS — I10 Essential (primary) hypertension: Secondary | ICD-10-CM

## 2013-09-03 DIAGNOSIS — E785 Hyperlipidemia, unspecified: Secondary | ICD-10-CM | POA: Diagnosis not present

## 2013-09-03 DIAGNOSIS — I4892 Unspecified atrial flutter: Secondary | ICD-10-CM | POA: Diagnosis not present

## 2013-09-03 DIAGNOSIS — I5031 Acute diastolic (congestive) heart failure: Secondary | ICD-10-CM

## 2013-09-03 DIAGNOSIS — I2510 Atherosclerotic heart disease of native coronary artery without angina pectoris: Secondary | ICD-10-CM | POA: Diagnosis not present

## 2013-09-03 MED ORDER — PRAVASTATIN SODIUM 40 MG PO TABS: 40.0000 mg | ORAL_TABLET | Freq: Every evening | ORAL | Status: DC

## 2013-09-03 NOTE — Assessment & Plan Note (Signed)
Begin Pravachol 40 mg daily and check lipids and liver in 6 weeks.

## 2013-09-03 NOTE — Patient Instructions (Addendum)
Your physician recommends that you schedule a follow-up appointment in: 3 MONTHS WITH DR CRENSHAW  START PRAVASTATIN 40 MG ONCE DAILY  Your physician recommends that you HAVE LAB WORK 6 WEEKS= DO NOT EAT PRIOR TO LAB WORK

## 2013-09-03 NOTE — Assessment & Plan Note (Signed)
Not on aspirin given need for anticoagulation. Liver functions have normalized on most recent laboratories. Lipitor discontinued previously because of increased liver functions. Begin Pravachol 40 mg daily. Check lipids and liver in 4 weeks.

## 2013-09-03 NOTE — Progress Notes (Signed)
HPI: fu syncope and atrial flutter. Also with hx of CAD, HTN, HL, OSA, hypothyroidism, COPD on O2, L breast CA. LHC at [facility] 10/2005: pRCA 75%, LM < 25%, pLAD 25%, D1 25%, D2 50%. Carotid Dopplers June 2013 showed no significant stenosis. She suffered a NSTEMI in 01/2010 in the setting of SIRS from leg cellulitis. This was felt to be demand ischemia. Med Rx was pursued (felt to be a poor candidate for noninvasive imaging). Echo 02/2012: EF 55-60%, mild AI, MAC, mod LAE, PASP mildly increased. Patient had atrial flutter with RVR 6/14. She was felt to be a poor candidate for atrial flutter ablation. TEE guided cardioversion was performed. She had restoration of NSR. TEE 03/15/13: EF 55-60%, no LA clot, positive PFO, mild to moderate TR. Coumadin previously discontinued because of vaginal bleeding. Patient admitted in December of 2014 following a syncopal episode. She had an implantable loop placed which demonstrated symptomatic bradycardia with pauses and ultimately had a pacemaker placed. Since discharge she denies dyspnea, chest pain or recurrent syncope. No pedal edema.   Current Outpatient Prescriptions  Medication Sig Dispense Refill  . acetaminophen (TYLENOL) 325 MG tablet Take 650 mg by mouth every 6 (six) hours as needed (cramps).      [name] albuterol (PROVENTIL HFA;VENTOLIN HFA) 108 (90 BASE) MCG/ACT inhaler Inhale 2 puffs into the lungs every 6 (six) hours as needed for wheezing.      [name] allopurinol (ZYLOPRIM) 300 MG tablet Take 1 tablet (300 mg total) by mouth daily.  90 tablet  3  . amitriptyline (ELAVIL) 100 MG tablet Take 100 mg by mouth at bedtime.      [name] apixaban (ELIQUIS) 5 MG TABS tablet Take 1 tablet (5 mg total) by mouth 2 (two) times daily.  180 tablet  3  . cefUROXime (CEFTIN) 500 MG tablet Take 1 tablet (500 mg total) by mouth 2 (two) times daily with a meal.  5 tablet  0  . Cholecalciferol (VITAMIN D) 2000 UNITS tablet Take 2,000 Units by mouth at bedtime.      . DULoxetine  (CYMBALTA) 30 MG capsule Take 60 mg by mouth at bedtime.       . ferrous sulfate 325 (65 FE) MG tablet Take 325 mg by mouth daily with breakfast.      . furosemide (LASIX) 40 MG tablet Take 40 mg by mouth See admin instructions. Take 1 tablet every morning then 1 tablet every other day at night      . levothyroxine (SYNTHROID, LEVOTHROID) 200 MCG tablet Take 1 tablet (200 mcg total) by mouth daily.  90 tablet  3  . metoprolol tartrate (LOPRESSOR) 25 MG tablet Take 1 tablet (25 mg total) by mouth 2 (two) times daily.  60 tablet  0  . Omega-3 Fatty Acids (FISH OIL PO) Take 1 capsule by mouth daily.       . potassium chloride SA (K-DUR,KLOR-CON) 20 MEQ tablet Take 20 mEq by mouth 2 (two) times daily.      . vitamin C (ASCORBIC ACID) 500 MG tablet Take 500 mg by mouth at bedtime.       No current facility-administered medications for this visit.     Past Medical History  Diagnosis Date  . Hypertension   . Hyperlipidemia   . Anemia   . CAD (coronary artery disease)     a. Nonobstructive CAD 2007 (EF 75%.  RCA  proximal 75% tubular, 25% prox LAD, 25% d1, 50% D2) at [facility]. b.  NSTEMI 01/2010 in setting of respiratory failure, medical approach (not a good candidate for noninvasive eval)  . OSA on CPAP   . Hypothyroidism   . Peripheral edema 06/13/2011  . Asthma 06/13/2011  . Depression 06/13/2011  . Neuropathy 06/13/2011  . Cervical radiculopathy 06/13/2011  . Gout 06/13/2011  . Obesity hypoventilation syndrome 06/13/2011  . COPD (chronic obstructive pulmonary disease)     on O2  . Breast cancer dx'd 2005    left  . Cellulitis     hx of cellulitis in left leg  . Impaired glucose tolerance 11/01/2012  . PFO (patent foramen ovale)   . Atrial flutter   . Syncope   . Atrial fibrillation   . Syncope and collapse     Bradycardia with pauses. S/P pacemaker  . Cardiac pacemaker     S/P     Past Surgical History  Procedure Laterality Date  . Ovarian cyst removal    . Breast lumpectomy    .  Cervical disc surgery      have cadaver bones,, screws, and plates   . Cervical biopsy  June 2013    at [facility] for Heavy  Bleeding  . Dilation and curettage of uterus    . Colonoscopy    . Laparoscopic appendectomy  03/29/2012    Procedure: APPENDECTOMY LAPAROSCOPIC;  Surgeon: Adin Hector, MD;  Location: [facility];  Service: General;  Laterality: N/A;  . Tee without cardioversion N/A 03/15/2013    Procedure: TRANSESOPHAGEAL ECHOCARDIOGRAM (TEE);  Surgeon: Thayer Headings, MD;  Location: [place];  Service: Cardiovascular;  Laterality: N/A;  . Cardioversion N/A 03/15/2013    Procedure: CARDIOVERSION;  Surgeon: Thayer Headings, MD;  Location: [facility] ENDOSCOPY;  Service: Cardiovascular;  Laterality: N/A;  . Loop recorder implant  08-28-2013    MDT LinQ implanted by Dr Rayann Heman for syncope    History   Social History  . Marital Status: Divorced    Spouse Name: N/A    Number of Children: N/A  . Years of Education: N/A   Occupational History  . Retired    Social History Main Topics  . Smoking status: Former Smoker -- 1.00 packs/day for 20 years    Types: Cigarettes    Quit date: 09/20/1998  . Smokeless tobacco: Never Used     Comment: 1ppd x 20 years  . Alcohol Use: 0.6 oz/week    1 Glasses of wine per week     Comment: rare  . Drug Use: No  . Sexual Activity: Not Currently    Birth Control/ Protection: Post-menopausal   Other Topics Concern  . Not on file   Social History Narrative   Lives alone in an apartment.    ROS: no fevers or chills, productive cough, hemoptysis, dysphasia, odynophagia, melena, hematochezia, dysuria, hematuria, rash, seizure activity, orthopnea, PND, pedal edema, claudication. Remaining systems are negative.  Physical Exam: Well-developed obese in no acute distress.  Skin is warm and dry.  HEENT abrasion above the left eye and ecchymosis from recent head trauma following syncopal episode. Neck is supple.  Chest is clear to auscultation with normal  expansion. Pacemaker site without evidence of infection. Cardiovascular exam is regular rate and rhythm.  Abdominal exam nontender or distended. No masses palpated. Extremities show no edema. neuro grossly intact

## 2013-09-03 NOTE — Assessment & Plan Note (Signed)
Patient found to be bradycardic on implantable loop monitor. She is now status post pacemaker. Followup electrophysiology.

## 2013-09-03 NOTE — Assessment & Plan Note (Signed)
Blood pressure controlled.continue present medications.

## 2013-09-03 NOTE — Assessment & Plan Note (Signed)
euvolemic on examination. Continue present dose of Lasix.

## 2013-09-03 NOTE — Assessment & Plan Note (Signed)
Previously felt not to be a good candidate for ablation. Continue apixaban. Continue metoprolol.

## 2013-09-05 ENCOUNTER — Ambulatory Visit (INDEPENDENT_AMBULATORY_CARE_PROVIDER_SITE_OTHER): Payer: Medicare Other | Admitting: Internal Medicine

## 2013-09-05 ENCOUNTER — Encounter: Payer: Self-pay | Admitting: Internal Medicine

## 2013-09-05 VITALS — BP 118/78 | HR 104 | Temp 99.8°F | Wt 355.4 lb

## 2013-09-05 DIAGNOSIS — R7309 Other abnormal glucose: Secondary | ICD-10-CM | POA: Diagnosis not present

## 2013-09-05 DIAGNOSIS — R7302 Impaired glucose tolerance (oral): Secondary | ICD-10-CM

## 2013-09-05 DIAGNOSIS — I10 Essential (primary) hypertension: Secondary | ICD-10-CM

## 2013-09-05 DIAGNOSIS — N950 Postmenopausal bleeding: Secondary | ICD-10-CM

## 2013-09-05 DIAGNOSIS — J449 Chronic obstructive pulmonary disease, unspecified: Secondary | ICD-10-CM | POA: Diagnosis not present

## 2013-09-05 DIAGNOSIS — J4489 Other specified chronic obstructive pulmonary disease: Secondary | ICD-10-CM

## 2013-09-05 NOTE — Assessment & Plan Note (Signed)
stable overall by history and exam, recent data reviewed with pt, and pt to continue medical treatment as before,  to f/u any worsening symptoms or concerns SpO2 Readings from Last 3 Encounters:  09/05/13 95%  09/01/13 94%  09/01/13 94%

## 2013-09-05 NOTE — Assessment & Plan Note (Signed)
stable overall by history and exam, recent data reviewed with pt, and pt to continue medical treatment as before,  to f/u any worsening symptoms or concerns BP Readings from Last 3 Encounters:  09/05/13 118/78  09/03/13 110/60  09/01/13 139/75

## 2013-09-05 NOTE — Progress Notes (Signed)
Pre-visit discussion using our clinic review tool. No additional management support is needed unless otherwise documented below in the visit note.  

## 2013-09-05 NOTE — Patient Instructions (Signed)
Please continue all other medications as before, and refills have been done if requested. Please have the pharmacy call with any other refills you may need. Please keep your appointments with your specialists as you have planned  No further lab work needed today  Please see your GYN regarding the bleeding  Please return in 3 months, or sooner if needed

## 2013-09-05 NOTE — Progress Notes (Signed)
Subjective:    Patient ID: [name] [name], female    DOB: Oct 15, 1945, 67 y.o.   MRN: [id number]  HPI Here to f/u; overall doing ok,  Pt denies chest pain, increased sob or doe, wheezing, orthopnea, PND, increased LE swelling, palpitations, dizziness or syncope.  Pt denies polydipsia, polyuria, or low sugar symptoms such as weakness or confusion improved with po intake.  Pt denies new neurological symptoms such as new headache, or facial or extremity weakness or numbness.   Pt states overall good compliance with meds, has been trying to follow lower cholesterol diet, with wt overall stable, walks with walker, now s/p recent PPM.  Fell with large bruise to right face and right forehead abrasion, healing ok.  Has ongoing mild vaginal BRB, on eliquis.  Plans to see GYN as prior GYN did not want to be aggressive per pt, she is wanting TAH Past Medical History  Diagnosis Date  . Hypertension   . Hyperlipidemia   . Anemia   . CAD (coronary artery disease)     a. Nonobstructive CAD 2007 (EF 75%.  RCA  proximal 75% tubular, 25% prox LAD, 25% d1, 50% D2) at [facility]. b. NSTEMI 01/2010 in setting of respiratory failure, medical approach (not a good candidate for noninvasive eval)  . OSA on CPAP   . Hypothyroidism   . Asthma 06/13/2011  . Depression 06/13/2011  . Neuropathy 06/13/2011  . Cervical radiculopathy 06/13/2011  . Gout 06/13/2011  . Obesity hypoventilation syndrome 06/13/2011  . COPD (chronic obstructive pulmonary disease)     on O2  . Breast cancer dx'd 2005    left  . Cellulitis     hx of cellulitis in left leg  . Impaired glucose tolerance 11/01/2012  . PFO (patent foramen ovale)   . Atrial flutter   . Atrial fibrillation   . Syncope and collapse     Bradycardia with pauses. S/P pacemaker   Past Surgical History  Procedure Laterality Date  . Ovarian cyst removal    . Breast lumpectomy    . Cervical disc surgery      have cadaver bones,, screws, and plates   . Cervical biopsy  June 2013    at [facility] for Heavy  Bleeding  . Dilation and curettage of uterus    . Colonoscopy    . Laparoscopic appendectomy  03/29/2012    Procedure: APPENDECTOMY LAPAROSCOPIC;  Surgeon: Adin Hector, MD;  Location: [facility];  Service: General;  Laterality: N/A;  . Tee without cardioversion N/A 03/15/2013    Procedure: TRANSESOPHAGEAL ECHOCARDIOGRAM (TEE);  Surgeon: Thayer Headings, MD;  Location: [place];  Service: Cardiovascular;  Laterality: N/A;  . Cardioversion N/A 03/15/2013    Procedure: CARDIOVERSION;  Surgeon: Thayer Headings, MD;  Location: [facility] ENDOSCOPY;  Service: Cardiovascular;  Laterality: N/A;  . Loop recorder implant  08-28-2013; 08-31-2013    MDT LinQ implanted by Dr Rayann Heman for syncope; explanted 08-31-2013 after sinus pauses identified  . Pacemaker insertion  08-31-2013    Biotronik Evera dual chamber pacemaker placed for neurocardiogenic syncope and sinus pauses by Dr Rayann Heman    reports that she quit smoking about 14 years ago. Her smoking use included Cigarettes. She has a 20 pack-year smoking history. She has never used smokeless tobacco. She reports that she drinks about 0.6 ounces of alcohol per week. She reports that she does not use illicit drugs. family history includes Breast cancer in an other family member; Cancer in her maternal aunt and mother; Cancer (  age of onset: 4) in her sister; Cancer (age of onset: 69) in her cousin; Diabetes in her brother; Heart disease in her mother; Uterine cancer in her mother. Allergies  Allergen Reactions  . Codeine     REACTION: unknown - pt. states unaware of having reaction to codeine  . Lisinopril     REACTION: cough   Current Outpatient Prescriptions on File Prior to Visit  Medication Sig Dispense Refill  . acetaminophen (TYLENOL) 325 MG tablet Take 650 mg by mouth every 6 (six) hours as needed (cramps).      [name] albuterol (PROVENTIL HFA;VENTOLIN HFA) 108 (90 BASE) MCG/ACT inhaler Inhale 2 puffs into the lungs every 6 (six) hours as  needed for wheezing.      [name] allopurinol (ZYLOPRIM) 300 MG tablet Take 1 tablet (300 mg total) by mouth daily.  90 tablet  3  . amitriptyline (ELAVIL) 100 MG tablet Take 100 mg by mouth at bedtime.      [name] apixaban (ELIQUIS) 5 MG TABS tablet Take 1 tablet (5 mg total) by mouth 2 (two) times daily.  180 tablet  3  . cefUROXime (CEFTIN) 500 MG tablet Take 1 tablet (500 mg total) by mouth 2 (two) times daily with a meal.  5 tablet  0  . Cholecalciferol (VITAMIN D) 2000 UNITS tablet Take 2,000 Units by mouth at bedtime.      . DULoxetine (CYMBALTA) 30 MG capsule Take 60 mg by mouth at bedtime.       . ferrous sulfate 325 (65 FE) MG tablet Take 325 mg by mouth daily with breakfast.      . furosemide (LASIX) 40 MG tablet Take 40 mg by mouth See admin instructions. Take 1 tablet every morning then 1 tablet every other day at night      . levothyroxine (SYNTHROID, LEVOTHROID) 200 MCG tablet Take 1 tablet (200 mcg total) by mouth daily.  90 tablet  3  . metoprolol tartrate (LOPRESSOR) 25 MG tablet Take 1 tablet (25 mg total) by mouth 2 (two) times daily.  60 tablet  0  . Omega-3 Fatty Acids (FISH OIL PO) Take 1 capsule by mouth daily.       . potassium chloride SA (K-DUR,KLOR-CON) 20 MEQ tablet Take 20 mEq by mouth 2 (two) times daily.      . pravastatin (PRAVACHOL) 40 MG tablet Take 1 tablet (40 mg total) by mouth every evening.  90 tablet  3  . vitamin C (ASCORBIC ACID) 500 MG tablet Take 500 mg by mouth at bedtime.       No current facility-administered medications on file prior to visit.   Review of Systems  Constitutional: Negative for unexpected weight change, or unusual diaphoresis  HENT: Negative for tinnitus.   Eyes: Negative for photophobia and visual disturbance.  Respiratory: Negative for choking and stridor.   Gastrointestinal: Negative for vomiting and blood in stool.  Genitourinary: Negative for hematuria and decreased urine volume.  Musculoskeletal: Negative for acute joint  swelling Skin: Negative for color change and wound.  Neurological: Negative for tremors and numbness other than noted  Psychiatric/Behavioral: Negative for decreased concentration or  hyperactivity.       Objective:   Physical Exam BP 118/78  Pulse 104  Temp(Src) 99.8 F (37.7 C) (Oral)  Wt 355 lb 6 oz (161.197 kg)  SpO2 95% VS noted, not ill appearing Constitutional: Pt appears well-developed and well-nourished.  HENT: Head: NCAT.  Right Ear: External ear normal.  Left Ear: External ear  normal.  Eyes: Conjunctivae and EOM are normal. Pupils are equal, round, and reactive to light.  Neck: Normal range of motion. Neck supple.  Cardiovascular: Normal rate and regular rhythm.   Pulmonary/Chest: Effort normal and breath sounds normal.  Abd:  Soft, NT, non-distended, + BS Neurological: Pt is alert. Not confused  Skin: Skin is warm. No erythema.  PPM site intac Psychiatric: Pt behavior is normal. Thought content normal.     Assessment & Plan:

## 2013-09-05 NOTE — Assessment & Plan Note (Signed)
Declines referral to new GYN, plans to self refer

## 2013-09-05 NOTE — Assessment & Plan Note (Signed)
stable overall by history and exam, recent data reviewed with pt, and pt to continue medical treatment as before,  to f/u any worsening symptoms or concerns  Lab Results  Component Value Date   HGBA1C 6.1 11/01/2012

## 2013-09-06 ENCOUNTER — Ambulatory Visit: Payer: Medicare Other

## 2013-09-12 ENCOUNTER — Ambulatory Visit: Payer: Medicare Other

## 2013-09-18 ENCOUNTER — Encounter: Payer: Self-pay | Admitting: Internal Medicine

## 2013-09-18 ENCOUNTER — Ambulatory Visit (INDEPENDENT_AMBULATORY_CARE_PROVIDER_SITE_OTHER): Payer: Medicare Other | Admitting: *Deleted

## 2013-09-18 DIAGNOSIS — I495 Sick sinus syndrome: Secondary | ICD-10-CM

## 2013-09-18 DIAGNOSIS — I4891 Unspecified atrial fibrillation: Secondary | ICD-10-CM | POA: Diagnosis not present

## 2013-09-18 LAB — MDC_IDC_ENUM_SESS_TYPE_INCLINIC
Battery Remaining Percentage: 100 %
Brady Statistic RA Percent Paced: 6 %
Implantable Pulse Generator Serial Number: 68181424
Lead Channel Pacing Threshold Amplitude: 0.8 V
Lead Channel Pacing Threshold Pulse Width: 0.4 ms
Lead Channel Sensing Intrinsic Amplitude: 10.5 mV
Lead Channel Sensing Intrinsic Amplitude: 3 mV
Lead Channel Setting Pacing Amplitude: 2.4 V
Lead Channel Setting Pacing Pulse Width: 0.4 ms

## 2013-09-18 NOTE — Progress Notes (Signed)
Pt seen in device clinic for follow up of recently implanted pacemaker---checked by industry.  Both wounds well healed (ILR explant & ppm).  No redness, swelling, or edema.  Steri-strips removed prior to arrival.   Device interrogated and found to be functioning normally.  No changes made today. See PaceArt for full details.  Pt denies chest pain, shortness of breath, palpitations, or dizziness.  Pt to follow up with Dr.Taylor in 3 months.   [name] [name] 09/18/2013 9:48 AM

## 2013-10-01 ENCOUNTER — Encounter: Payer: Self-pay | Admitting: Internal Medicine

## 2013-10-02 ENCOUNTER — Other Ambulatory Visit: Payer: Self-pay

## 2013-10-02 MED ORDER — FUROSEMIDE 40 MG PO TABS: 40.0000 mg | ORAL_TABLET | ORAL | Status: DC

## 2013-10-05 ENCOUNTER — Telehealth: Payer: Self-pay

## 2013-10-05 MED ORDER — FUROSEMIDE 40 MG PO TABS: 40.0000 mg | ORAL_TABLET | ORAL | Status: DC

## 2013-10-05 NOTE — Telephone Encounter (Signed)
[facility] requesting clarification on Furosemide prescription.  States there are 2 sets of instructions, please advise

## 2013-10-05 NOTE — Telephone Encounter (Signed)
rx resent  

## 2013-10-15 ENCOUNTER — Other Ambulatory Visit (INDEPENDENT_AMBULATORY_CARE_PROVIDER_SITE_OTHER): Payer: Medicare Other

## 2013-10-15 DIAGNOSIS — I10 Essential (primary) hypertension: Secondary | ICD-10-CM | POA: Diagnosis not present

## 2013-10-15 DIAGNOSIS — E785 Hyperlipidemia, unspecified: Secondary | ICD-10-CM

## 2013-10-15 LAB — LIPID PANEL
CHOLESTEROL: 169 mg/dL (ref 0–200)
HDL: 45.6 mg/dL (ref >39.00)
LDL Cholesterol: 101 mg/dL — ABNORMAL HIGH (ref 0–99)
TRIGLYCERIDES: 113 mg/dL (ref 0.0–149.0)
Total CHOL/HDL Ratio: 4
VLDL: 22.6 mg/dL (ref 0.0–40.0)

## 2013-10-15 LAB — HEPATIC FUNCTION PANEL
ALBUMIN: 3.8 g/dL (ref 3.5–5.2)
ALK PHOS: 70 U/L (ref 39–117)
ALT: 24 U/L (ref 0–35)
AST: 32 U/L (ref 0–37)
Bilirubin, Direct: 0 mg/dL (ref 0.0–0.3)
TOTAL PROTEIN: 7.6 g/dL (ref 6.0–8.3)
Total Bilirubin: 0.7 mg/dL (ref 0.3–1.2)

## 2013-10-17 ENCOUNTER — Other Ambulatory Visit: Payer: Self-pay | Admitting: Internal Medicine

## 2013-10-19 ENCOUNTER — Telehealth: Payer: Self-pay | Admitting: Cardiology

## 2013-10-19 DIAGNOSIS — E7800 Pure hypercholesterolemia, unspecified: Secondary | ICD-10-CM

## 2013-10-19 NOTE — Telephone Encounter (Signed)
New problem ° ° °Pt returning your call. °

## 2013-10-19 NOTE — Telephone Encounter (Signed)
Spoke with pt regarding labs, she reports she has only been taking the pravastatin 40 mg for about two weeks. She has just gotten it from the pharm. She will continue on the 40 mg and will return in 6 weeks. Pt agreed with this plan.

## 2013-10-22 ENCOUNTER — Other Ambulatory Visit: Payer: Self-pay | Admitting: *Deleted

## 2013-10-22 MED ORDER — METOPROLOL TARTRATE 25 MG PO TABS: 25.0000 mg | ORAL_TABLET | Freq: Two times a day (BID) | ORAL | Status: DC

## 2013-10-23 ENCOUNTER — Encounter: Payer: Self-pay | Admitting: Internal Medicine

## 2013-10-23 NOTE — Telephone Encounter (Signed)
[name] [name] to see above 

## 2013-10-29 ENCOUNTER — Other Ambulatory Visit: Payer: Self-pay | Admitting: Internal Medicine

## 2013-10-30 NOTE — Telephone Encounter (Signed)
Done erx 

## 2013-11-06 ENCOUNTER — Ambulatory Visit: Payer: Medicare Other | Admitting: Internal Medicine

## 2013-11-13 ENCOUNTER — Ambulatory Visit: Payer: Medicare Other

## 2013-11-13 ENCOUNTER — Ambulatory Visit (INDEPENDENT_AMBULATORY_CARE_PROVIDER_SITE_OTHER): Payer: Medicare Other | Admitting: Internal Medicine

## 2013-11-13 ENCOUNTER — Other Ambulatory Visit: Payer: Self-pay | Admitting: *Deleted

## 2013-11-13 ENCOUNTER — Other Ambulatory Visit: Payer: Medicare Other

## 2013-11-13 ENCOUNTER — Encounter: Payer: Self-pay | Admitting: Internal Medicine

## 2013-11-13 VITALS — BP 112/78 | HR 112 | Temp 99.4°F | Wt 366.8 lb

## 2013-11-13 DIAGNOSIS — J449 Chronic obstructive pulmonary disease, unspecified: Secondary | ICD-10-CM | POA: Diagnosis not present

## 2013-11-13 DIAGNOSIS — F3289 Other specified depressive episodes: Secondary | ICD-10-CM | POA: Diagnosis not present

## 2013-11-13 DIAGNOSIS — E7800 Pure hypercholesterolemia, unspecified: Secondary | ICD-10-CM

## 2013-11-13 DIAGNOSIS — R3915 Urgency of urination: Secondary | ICD-10-CM

## 2013-11-13 DIAGNOSIS — F329 Major depressive disorder, single episode, unspecified: Secondary | ICD-10-CM | POA: Diagnosis not present

## 2013-11-13 DIAGNOSIS — N950 Postmenopausal bleeding: Secondary | ICD-10-CM

## 2013-11-13 DIAGNOSIS — F32A Depression, unspecified: Secondary | ICD-10-CM

## 2013-11-13 LAB — CBC WITH DIFFERENTIAL/PLATELET
BASOS ABS: 0 10*3/uL (ref 0.0–0.1)
Basophils Relative: 0.3 % (ref 0.0–3.0)
Eosinophils Absolute: 0.4 10*3/uL (ref 0.0–0.7)
Eosinophils Relative: 6.9 % — ABNORMAL HIGH (ref 0.0–5.0)
HCT: 36.1 % (ref 36.0–46.0)
Hemoglobin: 11.5 g/dL — ABNORMAL LOW (ref 12.0–15.0)
LYMPHS PCT: 20.8 % (ref 12.0–46.0)
Lymphs Abs: 1.2 10*3/uL (ref 0.7–4.0)
MCHC: 31.9 g/dL (ref 30.0–36.0)
MCV: 95.8 fl (ref 78.0–100.0)
MONOS PCT: 7.5 % (ref 3.0–12.0)
Monocytes Absolute: 0.4 10*3/uL (ref 0.1–1.0)
NEUTROS ABS: 3.8 10*3/uL (ref 1.4–7.7)
Neutrophils Relative %: 64.5 % (ref 43.0–77.0)
Platelets: 195 10*3/uL (ref 150.0–400.0)
RBC: 3.76 Mil/uL — AB (ref 3.87–5.11)
RDW: 16.1 % — AB (ref 11.5–14.6)
WBC: 5.9 10*3/uL (ref 4.5–10.5)

## 2013-11-13 LAB — URINALYSIS, ROUTINE W REFLEX MICROSCOPIC
Bilirubin Urine: NEGATIVE
Hgb urine dipstick: NEGATIVE
KETONES UR: NEGATIVE
Nitrite: NEGATIVE
SPECIFIC GRAVITY, URINE: 1.025 (ref 1.000–1.030)
URINE GLUCOSE: NEGATIVE
Urobilinogen, UA: 0.2 (ref 0.0–1.0)
pH: 6 (ref 5.0–8.0)

## 2013-11-13 LAB — HEPATIC FUNCTION PANEL
ALBUMIN: 3.9 g/dL (ref 3.5–5.2)
ALT: 35 U/L (ref 0–35)
AST: 39 U/L — ABNORMAL HIGH (ref 0–37)
Alkaline Phosphatase: 68 U/L (ref 39–117)
Bilirubin, Direct: 0.1 mg/dL (ref 0.0–0.3)
TOTAL PROTEIN: 7.2 g/dL (ref 6.0–8.3)
Total Bilirubin: 0.7 mg/dL (ref 0.3–1.2)

## 2013-11-13 LAB — BASIC METABOLIC PANEL
BUN: 11 mg/dL (ref 6–23)
CALCIUM: 9.5 mg/dL (ref 8.4–10.5)
CO2: 35 meq/L — AB (ref 19–32)
CREATININE: 1 mg/dL (ref 0.4–1.2)
Chloride: 100 mEq/L (ref 96–112)
GFR: 73.59 mL/min (ref >60.00)
GLUCOSE: 86 mg/dL (ref 70–99)
Potassium: 4.7 mEq/L (ref 3.5–5.1)
SODIUM: 142 meq/L (ref 135–145)

## 2013-11-13 LAB — LIPID PANEL
Cholesterol: 141 mg/dL (ref 0–200)
HDL: 41.8 mg/dL (ref >39.00)
LDL CALC: 77 mg/dL (ref 0–99)
TRIGLYCERIDES: 109 mg/dL (ref 0.0–149.0)
Total CHOL/HDL Ratio: 3
VLDL: 21.8 mg/dL (ref 0.0–40.0)

## 2013-11-13 MED ORDER — CEPHALEXIN 500 MG PO CAPS: 500.0000 mg | ORAL_CAPSULE | Freq: Four times a day (QID) | ORAL | Status: DC

## 2013-11-13 NOTE — Assessment & Plan Note (Signed)
I offered to incr the cymbalta, or refer counseling or psychiatry, but declines at this time, denies SI or HI at this time

## 2013-11-13 NOTE — Progress Notes (Signed)
Pre visit review using our clinic review tool, if applicable. No additional management support is needed unless otherwise documented below in the visit note. 

## 2013-11-13 NOTE — Assessment & Plan Note (Signed)
stable overall by history and exam, recent data reviewed with pt, and pt to continue medical treatment as before,  to f/u any worsening symptoms or concerns SpO2 Readings from Last 3 Encounters:  11/13/13 97%  09/05/13 95%  09/01/13 94%

## 2013-11-13 NOTE — Progress Notes (Signed)
Subjective:    Patient ID: [name] [name], female    DOB: 08-09-1946, 68 y.o.   MRN: [id number]  HPI  Here to f/u; laments her condition of chronic resp failure and ongoing bleeding., as well as inability to lose wt. "I have some kind of mental issue about food." Has hx of hunger as child with relatively poor nutrition, now overdoes it.  Does want to go back to gym for exercise (? Realistic)  Walks with walker now, needs handicap parking form signed today.  Still with post menop bleeding, has not yet seen GYN in [place] since last visit here, still on megace, worried about wt gain, wants to see different GYN who might be more amenable to offering TAH, is on eliquis now with bleeding 2 wks per month. No malignancy per pt after ruled out on biopsy.  Has hx of aneima, still on iron. Last hgn 11.7 late dec 2014, asks for f/u cbc.  DId see card with lab jan 26 - had just started the pravachol,, still on 40 mg pravastatin as pt did not want to incr to 2.   Denies worsening depressive symptoms, suicidal ideation, or panic; has ongoing anxiety, but declines further referral for counseling or other tx as she is afraid of taking more meds.  Does also have low grade temp today, has felt poorly with urinary urgency and vague mid LBP for several days. Has hx of recurrent UTI Past Medical History  Diagnosis Date  . Hypertension   . Hyperlipidemia   . Anemia   . CAD (coronary artery disease)     a. Nonobstructive CAD 2007 (EF 75%.  RCA  proximal 75% tubular, 25% prox LAD, 25% d1, 50% D2) at [facility]. b. NSTEMI 01/2010 in setting of respiratory failure, medical approach (not a good candidate for noninvasive eval)  . OSA on CPAP   . Hypothyroidism   . Asthma 06/13/2011  . Depression 06/13/2011  . Neuropathy 06/13/2011  . Cervical radiculopathy 06/13/2011  . Gout 06/13/2011  . Obesity hypoventilation syndrome 06/13/2011  . COPD (chronic obstructive pulmonary disease)     on O2  . Breast cancer dx'd 2005    left  . Cellulitis      hx of cellulitis in left leg  . Impaired glucose tolerance 11/01/2012  . PFO (patent foramen ovale)   . Atrial flutter   . Atrial fibrillation   . Syncope and collapse     Bradycardia with pauses. S/P pacemaker   Past Surgical History  Procedure Laterality Date  . Ovarian cyst removal    . Breast lumpectomy    . Cervical disc surgery      have cadaver bones,, screws, and plates   . Cervical biopsy  June 2013    at [facility] for Heavy  Bleeding  . Dilation and curettage of uterus    . Colonoscopy    . Laparoscopic appendectomy  03/29/2012    Procedure: APPENDECTOMY LAPAROSCOPIC;  Surgeon: Adin Hector, MD;  Location: [facility];  Service: General;  Laterality: N/A;  . Tee without cardioversion N/A 03/15/2013    Procedure: TRANSESOPHAGEAL ECHOCARDIOGRAM (TEE);  Surgeon: Thayer Headings, MD;  Location: [place];  Service: Cardiovascular;  Laterality: N/A;  . Cardioversion N/A 03/15/2013    Procedure: CARDIOVERSION;  Surgeon: Thayer Headings, MD;  Location: [facility] ENDOSCOPY;  Service: Cardiovascular;  Laterality: N/A;  . Loop recorder implant  08-28-2013; 08-31-2013    MDT LinQ implanted by Dr Rayann Heman for syncope; explanted 08-31-2013 after sinus  pauses identified  . Pacemaker insertion  08-31-2013    Biotronik Evera dual chamber pacemaker placed for neurocardiogenic syncope and sinus pauses by Dr Rayann Heman    reports that she quit smoking about 15 years ago. Her smoking use included Cigarettes. She has a 20 pack-year smoking history. She has never used smokeless tobacco. She reports that she drinks about 0.6 ounces of alcohol per week. She reports that she does not use illicit drugs. family history includes Breast cancer in an other family member; Cancer in her maternal aunt and mother; Cancer (age of onset: 51) in her sister; Cancer (age of onset: 57) in her cousin; Diabetes in her brother; Heart disease in her mother; Uterine cancer in her mother. Allergies  Allergen Reactions  . Codeine      REACTION: unknown - pt. states unaware of having reaction to codeine  . Lisinopril     REACTION: cough   Current Outpatient Prescriptions on File Prior to Visit  Medication Sig Dispense Refill  . acetaminophen (TYLENOL) 325 MG tablet Take 650 mg by mouth every 6 (six) hours as needed (cramps).      [name] albuterol (PROVENTIL HFA;VENTOLIN HFA) 108 (90 BASE) MCG/ACT inhaler Inhale 2 puffs into the lungs every 6 (six) hours as needed for wheezing.      [name] allopurinol (ZYLOPRIM) 300 MG tablet Take 1 tablet (300 mg total) by mouth daily.  90 tablet  3  . amitriptyline (ELAVIL) 100 MG tablet TAKE 1 TABLET AT BEDTIME  90 tablet  1  . apixaban (ELIQUIS) 5 MG TABS tablet Take 1 tablet (5 mg total) by mouth 2 (two) times daily.  180 tablet  3  . cefUROXime (CEFTIN) 500 MG tablet Take 1 tablet (500 mg total) by mouth 2 (two) times daily with a meal.  5 tablet  0  . Cholecalciferol (VITAMIN D) 2000 UNITS tablet Take 2,000 Units by mouth at bedtime.      . DULoxetine (CYMBALTA) 30 MG capsule Take 60 mg by mouth at bedtime.       . ferrous sulfate 325 (65 FE) MG tablet Take 325 mg by mouth daily with breakfast.      . furosemide (LASIX) 40 MG tablet Take 1 tablet (40 mg total) by mouth See admin instructions.  90 tablet  3  . levothyroxine (SYNTHROID, LEVOTHROID) 200 MCG tablet Take 1 tablet (200 mcg total) by mouth daily.  90 tablet  3  . metoprolol tartrate (LOPRESSOR) 25 MG tablet Take 1 tablet (25 mg total) by mouth 2 (two) times daily.  60 tablet  0  . Omega-3 Fatty Acids (FISH OIL PO) Take 1 capsule by mouth daily.       . potassium chloride SA (K-DUR,KLOR-CON) 20 MEQ tablet Take 20 mEq by mouth 2 (two) times daily.      . pravastatin (PRAVACHOL) 40 MG tablet Take 1 tablet (40 mg total) by mouth every evening.  90 tablet  3  . vitamin C (ASCORBIC ACID) 500 MG tablet Take 500 mg by mouth at bedtime.       No current facility-administered medications on file prior to visit.   Review of Systems   Constitutional: Negative for unexpected weight change, or unusual diaphoresis  HENT: Negative for tinnitus.   Eyes: Negative for photophobia and visual disturbance.  Respiratory: Negative for choking and stridor.   Gastrointestinal: Negative for vomiting and blood in stool.  Genitourinary: Negative for hematuria and decreased urine volume.  Musculoskeletal: Negative for acute joint swelling Skin:  Negative for color change and wound.  Neurological: Negative for tremors and numbness other than noted  Psychiatric/Behavioral: Negative for decreased concentration or  hyperactivity.       Objective:   Physical Exam BP 112/78  Pulse 112  Temp(Src) 99.4 F (37.4 C) (Oral)  Wt 366 lb 12 oz (166.357 kg)  SpO2 97% VS noted, walks with walker Constitutional: Pt appears well-developed and well-nourished. /morbid obese HENT: Head: NCAT.  Right Ear: External ear normal.  Left Ear: External ear normal.  Eyes: Conjunctivae and EOM are normal. Pupils are equal, round, and reactive to light.  Neck: Normal range of motion. Neck supple.  Cardiovascular: Normal rate and regular rhythm.   Pulmonary/Chest: Effort normal and breath sounds decreased bilat, no rales or wheezing.  Abd:  Soft, NT, non-distended, + BS Spine nontender, no flank tender Neurological: Pt is alert. Not confused  Skin: Skin is warm. No erythema.  Psychiatric: Pt behavior is normal. Thought content normal. 1-2+  nervous    Assessment & Plan:

## 2013-11-13 NOTE — Assessment & Plan Note (Signed)
Suspect prob recurrent uti, for empiric tx, also urine studies

## 2013-11-13 NOTE — Assessment & Plan Note (Addendum)
Ongoing, on eliquis which she cont to take asd, pt agrees to cont megace as well despite risk of further wt gain for now, for cbc as last hgb with mild anemia late dec 2014; also refer new GYN per pt request, for second opinion on optimal management given her chronic illnesses (she wants TAH, and has tolerated fairly recent appy and PM)

## 2013-11-13 NOTE — Patient Instructions (Addendum)
Please take all new medication as prescribed - the antibiotic Please continue all other medications as before, and refills have been done if requested. Please have the pharmacy call with any other refills you may need.  You will be contacted regarding the referral for: GYN (Dr Daiva Huge per your request)  Please go to the LAB in the Basement (turn left off the elevator) for the tests to be done today You will be contacted by phone if any changes need to be made immediately.  Otherwise, you will receive a letter about your results with an explanation, but please check with MyChart first.  Please return in 6 months, or sooner if needed

## 2013-11-14 ENCOUNTER — Encounter: Payer: Self-pay | Admitting: *Deleted

## 2013-11-15 LAB — URINE CULTURE: Colony Count: 75000

## 2013-11-19 ENCOUNTER — Encounter: Payer: Self-pay | Admitting: Internal Medicine

## 2013-11-19 ENCOUNTER — Other Ambulatory Visit: Payer: Self-pay | Admitting: *Deleted

## 2013-11-19 MED ORDER — METOPROLOL TARTRATE 25 MG PO TABS: 25.0000 mg | ORAL_TABLET | Freq: Two times a day (BID) | ORAL | Status: DC

## 2013-11-19 NOTE — Telephone Encounter (Signed)
Sent email mail service never received metoprolol that md sent in, almost out of the 30 day he sent to local pharmacy...[name] [name]

## 2013-11-22 ENCOUNTER — Encounter: Payer: Self-pay | Admitting: Internal Medicine

## 2013-11-22 NOTE — Telephone Encounter (Signed)
Robin and nancy to see above

## 2013-11-26 ENCOUNTER — Telehealth: Payer: Self-pay | Admitting: *Deleted

## 2013-11-26 NOTE — Telephone Encounter (Signed)
Patient phoned c/o generalized swelling & asking if she can take 2 of her Lasix (40 mg).  Has OV scheduled for tomorrow afternoon.  Please advise.  CB# [phone]

## 2013-11-26 NOTE — Telephone Encounter (Signed)
I would wait for OV, but should let see UC or ER for worsening sob/orthopnea/pnd

## 2013-11-27 ENCOUNTER — Ambulatory Visit (INDEPENDENT_AMBULATORY_CARE_PROVIDER_SITE_OTHER): Payer: Medicare Other | Admitting: Internal Medicine

## 2013-11-27 ENCOUNTER — Other Ambulatory Visit: Payer: Medicare Other

## 2013-11-27 ENCOUNTER — Encounter: Payer: Self-pay | Admitting: Internal Medicine

## 2013-11-27 VITALS — BP 142/80 | HR 116 | Temp 98.8°F | Wt 362.5 lb

## 2013-11-27 DIAGNOSIS — R7309 Other abnormal glucose: Secondary | ICD-10-CM | POA: Diagnosis not present

## 2013-11-27 DIAGNOSIS — I10 Essential (primary) hypertension: Secondary | ICD-10-CM | POA: Diagnosis not present

## 2013-11-27 DIAGNOSIS — L538 Other specified erythematous conditions: Secondary | ICD-10-CM | POA: Diagnosis not present

## 2013-11-27 DIAGNOSIS — L26 Exfoliative dermatitis: Secondary | ICD-10-CM

## 2013-11-27 DIAGNOSIS — R7302 Impaired glucose tolerance (oral): Secondary | ICD-10-CM

## 2013-11-27 MED ORDER — PREDNISONE 10 MG PO TABS: ORAL_TABLET | ORAL | Status: DC

## 2013-11-27 MED ORDER — METHYLPREDNISOLONE ACETATE 80 MG/ML IJ SUSP
120.0000 mg | Freq: Once | INTRAMUSCULAR | Status: AC
  Administered 2013-11-27: 120 mg via INTRAMUSCULAR

## 2013-11-27 NOTE — Telephone Encounter (Signed)
Attempted to phone patient back, not at home, has an appt with cardio at 0740 and with PCP at 1715 today.

## 2013-11-27 NOTE — Assessment & Plan Note (Signed)
stable overall by history and exam, recent data reviewed with pt, and pt to continue medical treatment as before,  to f/u any worsening symptoms or concerns BP Readings from Last 3 Encounters:  11/27/13 142/80  11/13/13 112/78  09/05/13 118/78

## 2013-11-27 NOTE — Assessment & Plan Note (Signed)
stable overall by history and exam, recent data reviewed with pt, and pt to continue medical treatment as before,  to f/u any worsening symptoms or concerns Lab Results  Component Value Date   HGBA1C 6.1 11/01/2012   For f/u onset polys or cbg > 200

## 2013-11-27 NOTE — Assessment & Plan Note (Signed)
Likely related to recent cephalexin use, for depomedrol IM, predpack asd, to f/u any worsening symptoms or concerns

## 2013-11-27 NOTE — Patient Instructions (Signed)
You had the steroid shot today Please take all new medication as prescribed - the prednisone Please continue all other medications as before, and refills have been done if requested. Please have the pharmacy call with any other refills you may need.  Please remember to sign up for MyChart if you have not done so, as this will be important to you in the future with finding out test results, communicating by private email, and scheduling acute appointments online when needed.

## 2013-11-27 NOTE — Progress Notes (Signed)
Subjective:    Patient ID: [name] [name], female    DOB: 23-Sep-1945, 68 y.o.   MRN: [id number]  HPI Here with 3 days onset rash diffuse torso and extremities with itching and overall edematous extremities, LE more than UE.  Took 2 lasix last PM but not clear if helped.  Bendaryl OTC seemed to help the most.  Recently finished keflex.  Pt denies chest pain, increased sob or doe, wheezing, orthopnea, PND, increased LE swelling, palpitations, dizziness or syncope.  No lip or tongue swelling. Pt denies new neurological symptoms such as new headache, or facial or extremity weakness or numbness  Past Medical History  Diagnosis Date  . Hypertension   . Hyperlipidemia   . Anemia   . CAD (coronary artery disease)     a. Nonobstructive CAD 2007 (EF 75%.  RCA  proximal 75% tubular, 25% prox LAD, 25% d1, 50% D2) at [facility]. b. NSTEMI 01/2010 in setting of respiratory failure, medical approach (not a good candidate for noninvasive eval)  . OSA on CPAP   . Hypothyroidism   . Asthma 06/13/2011  . Depression 06/13/2011  . Neuropathy 06/13/2011  . Cervical radiculopathy 06/13/2011  . Gout 06/13/2011  . Obesity hypoventilation syndrome 06/13/2011  . COPD (chronic obstructive pulmonary disease)     on O2  . Breast cancer dx'd 2005    left  . Cellulitis     hx of cellulitis in left leg  . Impaired glucose tolerance 11/01/2012  . PFO (patent foramen ovale)   . Atrial flutter   . Atrial fibrillation   . Syncope and collapse     Bradycardia with pauses. S/P pacemaker   Past Surgical History  Procedure Laterality Date  . Ovarian cyst removal    . Breast lumpectomy    . Cervical disc surgery      have cadaver bones,, screws, and plates   . Cervical biopsy  June 2013    at [facility] for Heavy  Bleeding  . Dilation and curettage of uterus    . Colonoscopy    . Laparoscopic appendectomy  03/29/2012    Procedure: APPENDECTOMY LAPAROSCOPIC;  Surgeon: Adin Hector, MD;  Location: [facility];  Service: General;   Laterality: N/A;  . Tee without cardioversion N/A 03/15/2013    Procedure: TRANSESOPHAGEAL ECHOCARDIOGRAM (TEE);  Surgeon: Thayer Headings, MD;  Location: [place];  Service: Cardiovascular;  Laterality: N/A;  . Cardioversion N/A 03/15/2013    Procedure: CARDIOVERSION;  Surgeon: Thayer Headings, MD;  Location: [facility] ENDOSCOPY;  Service: Cardiovascular;  Laterality: N/A;  . Loop recorder implant  08-28-2013; 08-31-2013    MDT LinQ implanted by Dr Rayann Heman for syncope; explanted 08-31-2013 after sinus pauses identified  . Pacemaker insertion  08-31-2013    Biotronik Evera dual chamber pacemaker placed for neurocardiogenic syncope and sinus pauses by Dr Rayann Heman    reports that she quit smoking about 15 years ago. Her smoking use included Cigarettes. She has a 20 pack-year smoking history. She has never used smokeless tobacco. She reports that she drinks about 0.6 ounces of alcohol per week. She reports that she does not use illicit drugs. family history includes Breast cancer in an other family member; Cancer in her maternal aunt and mother; Cancer (age of onset: 101) in her sister; Cancer (age of onset: 67) in her cousin; Diabetes in her brother; Heart disease in her mother; Uterine cancer in her mother. Allergies  Allergen Reactions  . Cephalexin Other (See Comments)    Exfoliative dermatitis  reaction  . Codeine     REACTION: unknown - pt. states unaware of having reaction to codeine  . Lisinopril     REACTION: cough   Current Outpatient Prescriptions on File Prior to Visit  Medication Sig Dispense Refill  . acetaminophen (TYLENOL) 325 MG tablet Take 650 mg by mouth every 6 (six) hours as needed (cramps).      [name] albuterol (PROVENTIL HFA;VENTOLIN HFA) 108 (90 BASE) MCG/ACT inhaler Inhale 2 puffs into the lungs every 6 (six) hours as needed for wheezing.      [name] allopurinol (ZYLOPRIM) 300 MG tablet Take 1 tablet (300 mg total) by mouth daily.  90 tablet  3  . amitriptyline (ELAVIL) 100 MG tablet TAKE  1 TABLET AT BEDTIME  90 tablet  1  . apixaban (ELIQUIS) 5 MG TABS tablet Take 1 tablet (5 mg total) by mouth 2 (two) times daily.  180 tablet  3  . cefUROXime (CEFTIN) 500 MG tablet Take 1 tablet (500 mg total) by mouth 2 (two) times daily with a meal.  5 tablet  0  . Cholecalciferol (VITAMIN D) 2000 UNITS tablet Take 2,000 Units by mouth at bedtime.      . DULoxetine (CYMBALTA) 30 MG capsule Take 60 mg by mouth at bedtime.       . ferrous sulfate 325 (65 FE) MG tablet Take 325 mg by mouth daily with breakfast.      . furosemide (LASIX) 40 MG tablet Take 1 tablet (40 mg total) by mouth See admin instructions.  90 tablet  3  . levothyroxine (SYNTHROID, LEVOTHROID) 200 MCG tablet Take 1 tablet (200 mcg total) by mouth daily.  90 tablet  3  . metoprolol tartrate (LOPRESSOR) 25 MG tablet Take 1 tablet (25 mg total) by mouth 2 (two) times daily.  180 tablet  3  . Omega-3 Fatty Acids (FISH OIL PO) Take 1 capsule by mouth daily.       . potassium chloride SA (K-DUR,KLOR-CON) 20 MEQ tablet Take 20 mEq by mouth 2 (two) times daily.      . pravastatin (PRAVACHOL) 40 MG tablet Take 1 tablet (40 mg total) by mouth every evening.  90 tablet  3  . vitamin C (ASCORBIC ACID) 500 MG tablet Take 500 mg by mouth at bedtime.       No current facility-administered medications on file prior to visit.   Review of Systems  Constitutional: Negative for unexpected weight change, or unusual diaphoresis  HENT: Negative for tinnitus.   Eyes: Negative for photophobia and visual disturbance.  Respiratory: Negative for choking and stridor.   Gastrointestinal: Negative for vomiting and blood in stool.  Genitourinary: Negative for hematuria and decreased urine volume.  Musculoskeletal: Negative for acute joint swelling Skin: Negative for color change and wound.  Neurological: Negative for tremors and numbness other than noted  Psychiatric/Behavioral: Negative for decreased concentration or  hyperactivity.         Objective:   Physical Exam BP 142/80  Pulse 116  Temp(Src) 98.8 F (37.1 C) (Oral)  Wt 362 lb 8 oz (164.429 kg)  SpO2 93% VS noted,  Constitutional: Pt appears well-developed and well-nourished.  HENT: Head: NCAT.  Right Ear: External ear normal.  Left Ear: External ear normal.  Eyes: Conjunctivae and EOM are normal. Pupils are equal, round, and reactive to light.  Neck: Normal range of motion. Neck supple.  Cardiovascular: Normal rate and regular rhythm.   Pulmonary/Chest: Effort normal and breath sounds normal.  Abd:  Soft, NT, non-distended, + BS Neurological: Pt is alert. Not confused  Skin: with diffuse erythroderma to torso and extremities with exfoliation  Psychiatric: Pt behavior is normal. Thought content normal.     Assessment & Plan:

## 2013-11-27 NOTE — Progress Notes (Signed)
Pre visit review using our clinic review tool, if applicable. No additional management support is needed unless otherwise documented below in the visit note. 

## 2013-11-28 ENCOUNTER — Encounter: Payer: Self-pay | Admitting: Obstetrics & Gynecology

## 2013-12-03 ENCOUNTER — Encounter: Payer: Self-pay | Admitting: Cardiology

## 2013-12-03 ENCOUNTER — Ambulatory Visit (INDEPENDENT_AMBULATORY_CARE_PROVIDER_SITE_OTHER): Payer: Medicare Other | Admitting: Cardiology

## 2013-12-03 VITALS — BP 120/86 | HR 83 | Ht 65.5 in | Wt 351.1 lb

## 2013-12-03 DIAGNOSIS — I4891 Unspecified atrial fibrillation: Secondary | ICD-10-CM

## 2013-12-03 DIAGNOSIS — I10 Essential (primary) hypertension: Secondary | ICD-10-CM | POA: Diagnosis not present

## 2013-12-03 DIAGNOSIS — I509 Heart failure, unspecified: Secondary | ICD-10-CM

## 2013-12-03 DIAGNOSIS — Z950 Presence of cardiac pacemaker: Secondary | ICD-10-CM

## 2013-12-03 DIAGNOSIS — I2510 Atherosclerotic heart disease of native coronary artery without angina pectoris: Secondary | ICD-10-CM

## 2013-12-03 DIAGNOSIS — I4892 Unspecified atrial flutter: Secondary | ICD-10-CM

## 2013-12-03 DIAGNOSIS — I5031 Acute diastolic (congestive) heart failure: Secondary | ICD-10-CM

## 2013-12-03 LAB — BASIC METABOLIC PANEL
BUN: 18 mg/dL (ref 6–23)
CALCIUM: 9.5 mg/dL (ref 8.4–10.5)
CO2: 31 mEq/L (ref 19–32)
CREATININE: 1.1 mg/dL (ref 0.4–1.2)
Chloride: 100 mEq/L (ref 96–112)
GFR: 65 mL/min (ref >60.00)
Glucose, Bld: 109 mg/dL — ABNORMAL HIGH (ref 70–99)
Potassium: 4.5 mEq/L (ref 3.5–5.1)
Sodium: 142 mEq/L (ref 135–145)

## 2013-12-03 NOTE — Patient Instructions (Signed)
Your physician wants you to follow-up in: 6 MONTHS WITH DR CRENSHAW You will receive a reminder letter in the mail two months in advance. If you don't receive a letter, please call our office to schedule the follow-up appointment.   Your physician recommends that you HAVE LAB WORK TODAY 

## 2013-12-03 NOTE — Assessment & Plan Note (Signed)
Followed by electrophysiology. 

## 2013-12-03 NOTE — Assessment & Plan Note (Signed)
Felt not to be a candidate for atrial flutter ablation. Continue apixaban. Some vaginal bleeding. She would like to have a hysterectomy a GYN agreeable. I have asked her to contact us as she would need a nuclear study preoperatively. Continue beta blocker.

## 2013-12-03 NOTE — Assessment & Plan Note (Signed)
Continue statin. 

## 2013-12-03 NOTE — Progress Notes (Signed)
HPI: fu atrial flutter. Also with hx of CAD, HTN, HL, OSA, hypothyroidism, COPD on O2, L breast CA. LHC at [facility] 10/2005: pRCA 75%, LM < 25%, pLAD 25%, D1 25%, D2 50%. Carotid Dopplers June 2013 showed no significant stenosis. She suffered a NSTEMI in 01/2010 in the setting of SIRS from leg cellulitis. This was felt to be demand ischemia. Med Rx was pursued (felt to be a poor candidate for noninvasive imaging). Echo 02/2012: EF 55-60%, mild AI, MAC, mod LAE, PASP mildly increased. Patient had atrial flutter with RVR 6/14. She was felt to be a poor candidate for atrial flutter ablation. TEE guided cardioversion was performed. She had restoration of NSR. TEE 03/15/13: EF 55-60%, no LA clot, positive PFO, mild to moderate TR. Coumadin previously discontinued because of vaginal bleeding. Patient admitted in December of 2014 following a syncopal episode. She had an implantable loop placed which demonstrated symptomatic bradycardia with pauses and ultimately had a pacemaker placed. I last saw her in Dec 2014. Since then, she has some DOE; no chest pain; mild pedal edema.   Current Outpatient Prescriptions  Medication Sig Dispense Refill  . acetaminophen (TYLENOL) 325 MG tablet Take 650 mg by mouth every 6 (six) hours as needed (cramps).      [name] albuterol (PROVENTIL HFA;VENTOLIN HFA) 108 (90 BASE) MCG/ACT inhaler Inhale 2 puffs into the lungs every 6 (six) hours as needed for wheezing.      [name] allopurinol (ZYLOPRIM) 300 MG tablet Take 1 tablet (300 mg total) by mouth daily.  90 tablet  3  . amitriptyline (ELAVIL) 100 MG tablet TAKE 1 TABLET AT BEDTIME  90 tablet  1  . apixaban (ELIQUIS) 5 MG TABS tablet Take 1 tablet (5 mg total) by mouth 2 (two) times daily.  180 tablet  3  . Cholecalciferol (VITAMIN D) 2000 UNITS tablet Take 2,000 Units by mouth at bedtime.      . DULoxetine (CYMBALTA) 30 MG capsule Take 60 mg by mouth at bedtime.       . ferrous sulfate 325 (65 FE) MG tablet Take 325 mg by mouth daily  with breakfast.      . furosemide (LASIX) 40 MG tablet Take 1 tablet (40 mg total) by mouth See admin instructions.  90 tablet  3  . levothyroxine (SYNTHROID, LEVOTHROID) 200 MCG tablet Take 1 tablet (200 mcg total) by mouth daily.  90 tablet  3  . metoprolol tartrate (LOPRESSOR) 25 MG tablet Take 1 tablet (25 mg total) by mouth 2 (two) times daily.  180 tablet  3  . Omega-3 Fatty Acids (FISH OIL PO) Take 1 capsule by mouth daily.       . potassium chloride SA (K-DUR,KLOR-CON) 20 MEQ tablet Take 20 mEq by mouth 2 (two) times daily.      . pravastatin (PRAVACHOL) 40 MG tablet Take 1 tablet (40 mg total) by mouth every evening.  90 tablet  3  . predniSONE (DELTASONE) 10 MG tablet 3 tabs by mouth per day for 3 days,2tabs per day for 3 days,1tab per day for 3 days  18 tablet  0  . vitamin C (ASCORBIC ACID) 500 MG tablet Take 500 mg by mouth at bedtime.       No current facility-administered medications for this visit.     Past Medical History  Diagnosis Date  . Hypertension   . Hyperlipidemia   . Anemia   . CAD (coronary artery disease)     a. Nonobstructive  CAD 2007 (EF 75%.  RCA  proximal 75% tubular, 25% prox LAD, 25% d1, 50% D2) at [facility]. b. NSTEMI 01/2010 in setting of respiratory failure, medical approach (not a good candidate for noninvasive eval)  . OSA on CPAP   . Hypothyroidism   . Asthma 06/13/2011  . Depression 06/13/2011  . Neuropathy 06/13/2011  . Cervical radiculopathy 06/13/2011  . Gout 06/13/2011  . Obesity hypoventilation syndrome 06/13/2011  . COPD (chronic obstructive pulmonary disease)     on O2  . Breast cancer dx'd 2005    left  . Cellulitis     hx of cellulitis in left leg  . Impaired glucose tolerance 11/01/2012  . PFO (patent foramen ovale)   . Atrial flutter   . Atrial fibrillation   . Syncope and collapse     Bradycardia with pauses. S/P pacemaker    Past Surgical History  Procedure Laterality Date  . Ovarian cyst removal    . Breast lumpectomy    .  Cervical disc surgery      have cadaver bones,, screws, and plates   . Cervical biopsy  June 2013    at [facility] for Heavy  Bleeding  . Dilation and curettage of uterus    . Colonoscopy    . Laparoscopic appendectomy  03/29/2012    Procedure: APPENDECTOMY LAPAROSCOPIC;  Surgeon: Adin Hector, MD;  Location: [facility];  Service: General;  Laterality: N/A;  . Tee without cardioversion N/A 03/15/2013    Procedure: TRANSESOPHAGEAL ECHOCARDIOGRAM (TEE);  Surgeon: Thayer Headings, MD;  Location: [place];  Service: Cardiovascular;  Laterality: N/A;  . Cardioversion N/A 03/15/2013    Procedure: CARDIOVERSION;  Surgeon: Thayer Headings, MD;  Location: [facility] ENDOSCOPY;  Service: Cardiovascular;  Laterality: N/A;  . Loop recorder implant  08-28-2013; 08-31-2013    MDT LinQ implanted by Dr Rayann Heman for syncope; explanted 08-31-2013 after sinus pauses identified  . Pacemaker insertion  08-31-2013    Biotronik Evera dual chamber pacemaker placed for neurocardiogenic syncope and sinus pauses by Dr Rayann Heman    History   Social History  . Marital Status: Divorced    Spouse Name: N/A    Number of Children: N/A  . Years of Education: N/A   Occupational History  . Retired    Social History Main Topics  . Smoking status: Former Smoker -- 1.00 packs/day for 20 years    Types: Cigarettes    Quit date: 09/20/1998  . Smokeless tobacco: Never Used     Comment: 1ppd x 20 years  . Alcohol Use: 0.6 oz/week    1 Glasses of wine per week     Comment: rare  . Drug Use: No  . Sexual Activity: Not Currently    Birth Control/ Protection: Post-menopausal   Other Topics Concern  . Not on file   Social History Narrative   Lives alone in an apartment.    ROS: Vaginal bleeding but no fevers or chills, productive cough, hemoptysis, dysphasia, odynophagia, melena, hematochezia, dysuria, hematuria, rash, seizure activity, orthopnea, PND, claudication. Remaining systems are negative.  Physical Exam: Well-developed  obese in no acute distress.  Skin with desquamation from recent reaction to med HEENT is normal.  Neck is supple.  Chest is clear to auscultation with normal expansion.  Cardiovascular exam is regular rate and rhythm.  Abdominal exam nontender or distended. No masses palpated. Extremities show 1+ edema. neuro grossly intact  ECG sinus rhythm at a rateof 83. No ST changes.

## 2013-12-03 NOTE — Assessment & Plan Note (Signed)
Continue statin. Not on aspirin given need for anticoagulation.

## 2013-12-03 NOTE — Assessment & Plan Note (Signed)
Patient on prednisone for recent reaction to Keflex. Her weight has increased and she increased her diuretics. Check potassium and renal function. Continue present dose of Lasix.

## 2013-12-03 NOTE — Assessment & Plan Note (Signed)
Blood pressure controlled. Continue present medications. 

## 2013-12-26 ENCOUNTER — Ambulatory Visit (HOSPITAL_COMMUNITY)
Admission: RE | Admit: 2013-12-26 | Discharge: 2013-12-26 | Disposition: A | Discharge status: Home / Self Care | Payer: Medicare Other | Source: Ambulatory Visit | Attending: Internal Medicine | Admitting: Internal Medicine

## 2013-12-26 ENCOUNTER — Ambulatory Visit (HOSPITAL_BASED_OUTPATIENT_CLINIC_OR_DEPARTMENT_OTHER): Payer: Medicare Other | Admitting: Internal Medicine

## 2013-12-26 ENCOUNTER — Telehealth: Payer: Self-pay | Admitting: Internal Medicine

## 2013-12-26 ENCOUNTER — Other Ambulatory Visit (HOSPITAL_BASED_OUTPATIENT_CLINIC_OR_DEPARTMENT_OTHER): Payer: Medicare Other

## 2013-12-26 VITALS — BP 114/60 | HR 78 | Temp 97.5°F | Resp 17 | Ht 65.5 in | Wt 356.5 lb

## 2013-12-26 DIAGNOSIS — R945 Abnormal results of liver function studies: Secondary | ICD-10-CM

## 2013-12-26 DIAGNOSIS — E662 Morbid (severe) obesity with alveolar hypoventilation: Secondary | ICD-10-CM

## 2013-12-26 DIAGNOSIS — C50919 Malignant neoplasm of unspecified site of unspecified female breast: Secondary | ICD-10-CM

## 2013-12-26 DIAGNOSIS — Z853 Personal history of malignant neoplasm of breast: Secondary | ICD-10-CM

## 2013-12-26 DIAGNOSIS — R0989 Other specified symptoms and signs involving the circulatory and respiratory systems: Secondary | ICD-10-CM | POA: Diagnosis not present

## 2013-12-26 DIAGNOSIS — R0602 Shortness of breath: Secondary | ICD-10-CM | POA: Insufficient documentation

## 2013-12-26 DIAGNOSIS — I4891 Unspecified atrial fibrillation: Secondary | ICD-10-CM

## 2013-12-26 DIAGNOSIS — R7989 Other specified abnormal findings of blood chemistry: Secondary | ICD-10-CM

## 2013-12-26 DIAGNOSIS — R0609 Other forms of dyspnea: Secondary | ICD-10-CM | POA: Insufficient documentation

## 2013-12-26 LAB — CBC WITH DIFFERENTIAL/PLATELET
BASO%: 0.6 % (ref 0.0–2.0)
Basophils Absolute: 0 10*3/uL (ref 0.0–0.1)
EOS%: 5.3 % (ref 0.0–7.0)
Eosinophils Absolute: 0.3 10*3/uL (ref 0.0–0.5)
HCT: 42.1 % (ref 34.8–46.6)
HGB: 13.5 g/dL (ref 11.6–15.9)
LYMPH#: 2 10*3/uL (ref 0.9–3.3)
LYMPH%: 34.6 % (ref 14.0–49.7)
MCH: 29.4 pg (ref 25.1–34.0)
MCHC: 32 g/dL (ref 31.5–36.0)
MCV: 91.8 fL (ref 79.5–101.0)
MONO#: 0.5 10*3/uL (ref 0.1–0.9)
MONO%: 8.9 % (ref 0.0–14.0)
NEUT#: 2.9 10*3/uL (ref 1.5–6.5)
NEUT%: 50.6 % (ref 38.4–76.8)
Platelets: 165 10*3/uL (ref 145–400)
RBC: 4.59 10*6/uL (ref 3.70–5.45)
RDW: 14.3 % (ref 11.2–14.5)
WBC: 5.8 10*3/uL (ref 3.9–10.3)

## 2013-12-26 LAB — COMPREHENSIVE METABOLIC PANEL (CC13)
ALT: 40 U/L (ref 0–55)
ANION GAP: 12 meq/L — AB (ref 3–11)
AST: 40 U/L — ABNORMAL HIGH (ref 5–34)
Albumin: 3.9 g/dL (ref 3.5–5.0)
Alkaline Phosphatase: 104 U/L (ref 40–150)
BUN: 14.1 mg/dL (ref 7.0–26.0)
CO2: 33 meq/L — AB (ref 22–29)
Calcium: 9.8 mg/dL (ref 8.4–10.4)
Chloride: 100 mEq/L (ref 98–109)
Creatinine: 1 mg/dL (ref 0.6–1.1)
Glucose: 100 mg/dl (ref 70–140)
Potassium: 3.8 mEq/L (ref 3.5–5.1)
SODIUM: 144 meq/L (ref 136–145)
TOTAL PROTEIN: 7.8 g/dL (ref 6.4–8.3)
Total Bilirubin: 0.36 mg/dL (ref 0.20–1.20)

## 2013-12-26 NOTE — Telephone Encounter (Signed)
gv pt appt schedule for oct and mammo for 05/23/14.

## 2013-12-27 ENCOUNTER — Other Ambulatory Visit (HOSPITAL_COMMUNITY): Payer: Medicare Other

## 2013-12-28 ENCOUNTER — Encounter: Payer: Self-pay | Admitting: *Deleted

## 2013-12-28 NOTE — Progress Notes (Signed)
[facility] OFFICE PROGRESS NOTE  [name], MD [place] [address]  DIAGNOSIS: Breast cancer - Plan: CBC with Differential, Comprehensive metabolic panel (Cmet) - [facility], MM Digital Diagnostic Bilat  Chief Complaint  Patient presents with  . Follow-up    CURRENT THERAPY: Surveillance  INTERVAL HISTORY: [name] [name] 68 y.o. female with history of infiltrating ductal carcinoma of the left breast (August 2006) who received all of her care for her breast cancer at [facility] is here for followup.  She was last seen on 06/28/2013 by me.  Her breast cancer history is as noted below.    Today, overall, she reports doing pretty good.  She reports continued neuropathy of her hands and feet since receipt of her chemotherapy.  She denies any fevers or chills.  She reports resolution of her prior left breast cellulitis.  She was last seen by her primary care physician Dr Jenny Reichmann on 11/07/2013.  She reports resolution of her postmenopausal vaginal bleeding.  She is off the megace now.  She does report having a pacemaker implanted in December 4782 without complication.  She is scheduled to see her Gynecologist Dr. Roselie Awkward on 01/04/2014. She does report having a severe skin reaction to Keflex for treatment of a urinary tract infection about 6 weeks ago.  Her main goal is to get back to water aerobics and exercise to help her lose weight, she reports.   MEDICAL HISTORY: Past Medical History  Diagnosis Date  . Hypertension   . Hyperlipidemia   . Anemia   . CAD (coronary artery disease)     a. Nonobstructive CAD 2007 (EF 75%.  RCA  proximal 75% tubular, 25% prox LAD, 25% d1, 50% D2) at [facility]. b. NSTEMI 01/2010 in setting of respiratory failure, medical approach (not a good candidate for noninvasive eval)  . OSA on CPAP   . Hypothyroidism   . Asthma 06/13/2011  . Depression 06/13/2011  . Neuropathy 06/13/2011  . Cervical radiculopathy 06/13/2011  . Gout 06/13/2011  . Obesity  hypoventilation syndrome 06/13/2011  . COPD (chronic obstructive pulmonary disease)     on O2  . Breast cancer dx'd 2005    left  . Cellulitis     hx of cellulitis in left leg  . Impaired glucose tolerance 11/01/2012  . PFO (patent foramen ovale)   . Atrial flutter   . Atrial fibrillation   . Syncope and collapse     Bradycardia with pauses. S/P pacemaker    INTERIM HISTORY: has HYPOTHYROIDISM; HYPERLIPIDEMIA; HYPERTENSION; ACUT MI SUBENDOCARDIAL INFARCT SUBSQT EPIS CARE; CORONARY ARTERY DISEASE; C O P D; OBSTRUCTIVE SLEEP APNEA; OXYGEN-USE OF SUPPLEMENTAL; Preventative health care; Breast cancer; Asthma; Depression; Colon polyps; Neuropathy; Cervical radiculopathy; Gout; Obesity hypoventilation syndrome; Sick sinus syndrome; Morbid obesity with BMI of 50.0-59.9, adult; Acute appendicitis; Cough; Abnormal TSH; Right lumbar radiculopathy; Impaired glucose tolerance; Abnormal liver function tests; Atrial flutter; Tachycardia; Anticoagulant long-term use; Tachy-brady syndrome; Acute diastolic CHF (congestive heart failure); PFO (patent foramen ovale); Hypokalemia; Postmenopausal vaginal bleeding; Syncope; Atrial fibrillation; Syncope and collapse; Urinary urgency; Post-menopausal bleeding; Exfoliative dermatitis; and Pacemaker on her problem list.    ALLERGIES:  is allergic to cephalexin; codeine; and lisinopril.  MEDICATIONS: has a current medication list which includes the following prescription(s): acetaminophen, albuterol, allopurinol, amitriptyline, apixaban, vitamin d, duloxetine, ferrous sulfate, furosemide, levothyroxine, metoprolol tartrate, omega-3 fatty acids, potassium chloride sa, pravastatin, and vitamin c.  SURGICAL HISTORY:  Past Surgical History  Procedure Laterality Date  . Ovarian  cyst removal    . Breast lumpectomy    . Cervical disc surgery      have cadaver bones,, screws, and plates   . Cervical biopsy  June 2013    at [facility] for Heavy  Bleeding  . Dilation and curettage  of uterus    . Colonoscopy    . Laparoscopic appendectomy  03/29/2012    Procedure: APPENDECTOMY LAPAROSCOPIC;  Surgeon: Adin Hector, MD;  Location: [facility];  Service: General;  Laterality: N/A;  . Tee without cardioversion N/A 03/15/2013    Procedure: TRANSESOPHAGEAL ECHOCARDIOGRAM (TEE);  Surgeon: Thayer Headings, MD;  Location: [place];  Service: Cardiovascular;  Laterality: N/A;  . Cardioversion N/A 03/15/2013    Procedure: CARDIOVERSION;  Surgeon: Thayer Headings, MD;  Location: [facility] ENDOSCOPY;  Service: Cardiovascular;  Laterality: N/A;  . Loop recorder implant  08-28-2013; 08-31-2013    MDT LinQ implanted by Dr Rayann Heman for syncope; explanted 08-31-2013 after sinus pauses identified  . Pacemaker insertion  08-31-2013    Biotronik Evera dual chamber pacemaker placed for neurocardiogenic syncope and sinus pauses by Dr Rayann Heman    REVIEW OF SYSTEMS:   Constitutional: Denies fevers, chills or abnormal weight loss Eyes: Denies blurriness of vision Ears, nose, mouth, throat, and face: Denies mucositis or sore throat Respiratory: Denies cough, dyspnea or wheezes Cardiovascular: Denies palpitation, chest discomfort or lower extremity swelling Gastrointestinal:  Denies nausea, heartburn or change in bowel habits Skin: Denies abnormal skin rashes Lymphatics: Denies new lymphadenopathy or easy bruising Neurological:She reports numbness, tingling of her hands and feets but denies new weaknesses Behavioral/Psych: Mood is stable, no new changes  All other systems were reviewed with the patient and are negative.  PHYSICAL EXAMINATION: ECOG PERFORMANCE STATUS: 1 - Symptomatic but completely ambulatory  Blood pressure 114/60, pulse 78, temperature 97.5 F (36.4 C), temperature source Oral, resp. rate 17, height 5' 5.5" (1.664 m), weight 356 lb 8 oz (161.707 kg), SpO2 98.00%.  GENERAL:alert, no distress and comfortable; Morbidly obese. SKIN: skin color, texture, turgor are normal, no rashes or  significant lesions EYES: normal, Conjunctiva are pink and non-injected, sclera clear OROPHARYNX:no exudate, no erythema and lips, buccal mucosa, and tongue normal  NECK: supple, thyroid normal size, non-tender, without nodularity LYMPH:  no palpable lymphadenopathy in the cervical, axillary or supraclavicular LUNGS: clear to auscultation and percussion with normal breathing effort HEART: regular rate & rhythm and no murmurs and no chronic pitting extremity edema BREASTS: Large, right breast is benign, and pendulous.  Left breast shows the effects of radiation with skin changes but no suspicious finding suggestive of recurrence.  No axillary adenopathy.  ABDOMEN:abdomen soft, non-tender and normal bowel sounds Musculoskeletal:no cyanosis of digits and no clubbing  NEURO: alert & oriented x 3 with fluent speech, no focal motor/sensory deficits  Labs:  Lab Results  Component Value Date   WBC 5.8 12/26/2013   HGB 13.5 12/26/2013   HCT 42.1 12/26/2013   MCV 91.8 12/26/2013   PLT 165 12/26/2013   NEUTROABS 2.9 12/26/2013      Chemistry      Component Value Date/Time   NA 144 12/26/2013 0859   NA 142 12/03/2013 0928   K 3.8 12/26/2013 0859   K 4.5 12/03/2013 0928   CL 100 12/03/2013 0928   CL 97* 12/28/2012 0930   CO2 33* 12/26/2013 0859   CO2 31 12/03/2013 0928   BUN 14.1 12/26/2013 0859   BUN 18 12/03/2013 0928   CREATININE 1.0 12/26/2013 0859  CREATININE 1.1 12/03/2013 0928      Component Value Date/Time   CALCIUM 9.8 12/26/2013 0859   CALCIUM 9.5 12/03/2013 0928   ALKPHOS 104 12/26/2013 0859   ALKPHOS 68 11/13/2013 1531   AST 40* 12/26/2013 0859   AST 39* 11/13/2013 1531   ALT 40 12/26/2013 0859   ALT 35 11/13/2013 1531   BILITOT 0.36 12/26/2013 0859   BILITOT 0.7 11/13/2013 1531     RADIOGRAPHIC STUDIES: CXR 12/26/2013 CLINICAL DATA: History of breast cancer, hypoventilation syndrome  and abnormal liver function tests. Chronic shortness of breath.  EXAM:  CHEST 2 VIEW  COMPARISON: DG CHEST 2 VIEW dated  09/01/2013; CT ANGIO CHEST W/CM  &/OR WO/CM dated 08/28/2013  FINDINGS:  Trachea is midline. Heart is mildly enlarged and stable. Left  subclavian pacemaker lead tips are seen in the right atrium and  right ventricle. Lungs are hyperinflated but clear. No pleural  fluid.  IMPRESSION:  Hyperinflation without acute finding.   Mm Digital Screening  06/04/2013   *RADIOLOGY REPORT*  Clinical Data: Screening. Prior malignant left breast lumpectomy in April, 2006 with subsequent radiation therapy.  DIGITAL SCREENING BILATERAL MAMMOGRAM WITH CAD  Comparison:  Previous exam(s).  FINDINGS:  ACR Breast Density Category b:  There are scattered areas of fibroglandular density.  There are no findings suspicious for malignancy. Expected post lumpectomy changes in the left breast.  Images were processed with CAD.  IMPRESSION: No mammographic evidence of malignancy.  A result letter of this screening mammogram will be mailed directly to the patient.  RECOMMENDATION: Screening mammogram in one year. (Code:SM-B-01Y)  BI-RADS CATEGORY 2:  Benign finding(s).   Original Report Authenticated By: Evangeline Dakin, M.D.   PATHOLOGY: Diagnosis Endometrium, biopsy - DEGENERATING SECRETORY-TYPE ENDOMETRIUM. - THERE IS NO EVIDENCE OF HYPERPLASIA OR MALIGNANCY. - SEE COMMENT. Microscopic Comment This pattern can be associated with menses, irregular shedding or breakthrough bleeding associated with hormonal therapy. Clinical correlation is suggested. (JBK:caf 05/14/13) Enid Cutter MD Pathologist, Electronic Signature (Case signed 05/14/2013)  ASSESSMENT: [name] [name] 68 y.o. female with a history of Breast cancer - Plan: CBC with Differential, Comprehensive metabolic panel (Cmet) - [facility], MM Digital Diagnostic Bilat  PLAN:  1. T2N0, stage IIA triple negative infiltrating ductal carcinoma of the left breast (2006).  --She is s/p AC x 4 and then weekly Taxotere completed on April, 2007.  She has received radiation treatments  between April and June of 2007.   --She is now over 8 years from the time of diagnosis without evidence of disease.  Patient was initially offered to return every 3 months, but has declined and wished to return on a 6 month basis.  She will continue screening mammograms.   Her chest xray is as noted above.   2. Postmenopausal vaginal bleeding. --She has had one biopsy which has been negative for malignancy.  She will continue close follow up with her Ob/Gyn on 04/17.  Her hemoglobin is stable.   She reports that she was not a candidate for hysterectomy.   3. Afib  --She will continue her apixaban 5 mg bid.  She was counseled extensively on symptoms of anemia and worsening bleeding in the setting of #2. She understands and agrees to call her Ob/Gyn for repeat testing if symptoms persist.   All questions were answered. The patient knows to call the clinic with any problems, questions or concerns. We can certainly see the patient much sooner if necessary.  The patient was provided an after visit summary.  I spent 15 minutes counseling the patient face to face. The total time spent in the appointment was 25 minutes.    Concha Norway, MD 12/28/2013 4:28

## 2014-01-04 ENCOUNTER — Encounter: Payer: Self-pay | Admitting: Obstetrics & Gynecology

## 2014-01-04 ENCOUNTER — Ambulatory Visit (INDEPENDENT_AMBULATORY_CARE_PROVIDER_SITE_OTHER): Payer: Medicare Other | Admitting: Obstetrics & Gynecology

## 2014-01-04 VITALS — BP 124/84 | HR 83 | Temp 98.4°F | Ht 65.0 in | Wt 356.3 lb

## 2014-01-04 DIAGNOSIS — I2510 Atherosclerotic heart disease of native coronary artery without angina pectoris: Secondary | ICD-10-CM

## 2014-01-04 DIAGNOSIS — N950 Postmenopausal bleeding: Secondary | ICD-10-CM | POA: Diagnosis not present

## 2014-01-04 NOTE — Progress Notes (Signed)
Patient ID: [name] [name], female   DOB: [birth year], 68 y.o.   MRN: [id number]  Chief Complaint  Patient presents with  . post menopausal bleeding    HPI [name] [name] is a 68 y.o. female.  No obstetric history on file. No LMP recorded. Patient is postmenopausal. Has had PMP bleeding which did not respond well to oral progestins, no bleeding now but she wants to prevent future episode and thinks she should have hysterectomy despite being advised she is a poor surgical candidate. Last bleeding was in February. Benign biopsy 04/2013  HPI  Past Medical History  Diagnosis Date  . Hypertension   . Hyperlipidemia   . Anemia   . CAD (coronary artery disease)     a. Nonobstructive CAD 2007 (EF 75%.  RCA  proximal 75% tubular, 25% prox LAD, 25% d1, 50% D2) at [facility]. b. NSTEMI 01/2010 in setting of respiratory failure, medical approach (not a good candidate for noninvasive eval)  . OSA on CPAP   . Hypothyroidism   . Asthma 06/13/2011  . Depression 06/13/2011  . Neuropathy 06/13/2011  . Cervical radiculopathy 06/13/2011  . Gout 06/13/2011  . Obesity hypoventilation syndrome 06/13/2011  . COPD (chronic obstructive pulmonary disease)     on O2  . Breast cancer dx'd 2005    left  . Cellulitis     hx of cellulitis in left leg  . Impaired glucose tolerance 11/01/2012  . PFO (patent foramen ovale)   . Atrial flutter   . Atrial fibrillation   . Syncope and collapse     Bradycardia with pauses. S/P pacemaker    Past Surgical History  Procedure Laterality Date  . Ovarian cyst removal    . Breast lumpectomy    . Cervical disc surgery      have cadaver bones,, screws, and plates   . Cervical biopsy  June 2013    at [facility] for Heavy  Bleeding  . Dilation and curettage of uterus    . Colonoscopy    . Laparoscopic appendectomy  03/29/2012    Procedure: APPENDECTOMY LAPAROSCOPIC;  Surgeon: Adin Hector, MD;  Location[facility];  Service: General;  Laterality: N/A;  . Tee without cardioversion N/A  03/15/2013    Procedure: TRANSESOPHAGEAL ECHOCARDIOGRAM (TEE);  Surgeon: Thayer Headings, MD;  Location: [place];  Service: Cardiovascular;  Laterality: N/A;  . Cardioversion N/A 03/15/2013    Procedure: CARDIOVERSION;  Surgeon: Thayer Headings, MD;  Location: [facility] ENDOSCOPY;  Service: Cardiovascular;  Laterality: N/A;  . Loop recorder implant  08-28-2013; 08-31-2013    MDT LinQ implanted by Dr Rayann Heman for syncope; explanted 08-31-2013 after sinus pauses identified  . Pacemaker insertion  08-31-2013    Biotronik Evera dual chamber pacemaker placed for neurocardiogenic syncope and sinus pauses by Dr Rayann Heman    Family History  Problem Relation Age of Onset  . Breast cancer    . Uterine cancer Mother   . Cancer Mother     [name] [name]  . Heart disease Mother   . Cancer Sister 68    breast  . Diabetes Brother   . Cancer Maternal Aunt     breast  . Cancer Cousin 42    breast    Social History History  Substance Use Topics  . Smoking status: Former Smoker -- 1.00 packs/day for 20 years    Types: Cigarettes    Quit date: 09/20/1998  . Smokeless tobacco: Never Used     Comment: 1ppd x 20 years  . Alcohol  Use: 0.6 oz/week    1 Glasses of wine per week     Comment: rare    Allergies  Allergen Reactions  . Cephalexin Other (See Comments)    Exfoliative dermatitis reaction  . Codeine     REACTION: unknown - pt. states unaware of having reaction to codeine  . Lisinopril     REACTION: cough    Current Outpatient Prescriptions  Medication Sig Dispense Refill  . acetaminophen (TYLENOL) 325 MG tablet Take 650 mg by mouth every 6 (six) hours as needed (cramps).      [name] albuterol (PROVENTIL HFA;VENTOLIN HFA) 108 (90 BASE) MCG/ACT inhaler Inhale 2 puffs into the lungs every 6 (six) hours as needed for wheezing.      [name] allopurinol (ZYLOPRIM) 300 MG tablet Take 1 tablet (300 mg total) by mouth daily.  90 tablet  3  . amitriptyline (ELAVIL) 100 MG tablet TAKE 1 TABLET AT BEDTIME  90 tablet  1   . apixaban (ELIQUIS) 5 MG TABS tablet Take 1 tablet (5 mg total) by mouth 2 (two) times daily.  180 tablet  3  . Cholecalciferol (VITAMIN D) 2000 UNITS tablet Take 2,000 Units by mouth at bedtime.      . DULoxetine (CYMBALTA) 30 MG capsule Take 60 mg by mouth at bedtime.       . ferrous sulfate 325 (65 FE) MG tablet Take 325 mg by mouth daily with breakfast.      . furosemide (LASIX) 40 MG tablet Take 1 tablet (40 mg total) by mouth See admin instructions.  90 tablet  3  . levothyroxine (SYNTHROID, LEVOTHROID) 200 MCG tablet Take 1 tablet (200 mcg total) by mouth daily.  90 tablet  3  . metoprolol tartrate (LOPRESSOR) 25 MG tablet Take 1 tablet (25 mg total) by mouth 2 (two) times daily.  180 tablet  3  . Omega-3 Fatty Acids (FISH OIL PO) Take 1 capsule by mouth daily.       . potassium chloride SA (K-DUR,KLOR-CON) 20 MEQ tablet Take 20 mEq by mouth 2 (two) times daily.      . pravastatin (PRAVACHOL) 40 MG tablet Take 1 tablet (40 mg total) by mouth every evening.  90 tablet  3  . vitamin C (ASCORBIC ACID) 500 MG tablet Take 500 mg by mouth at bedtime.       No current facility-administered medications for this visit.    Review of Systems Review of Systems  Blood pressure 124/84, pulse 83, temperature 98.4 F (36.9 C), temperature source Oral, height 5' 5"  (1.651 m), weight 356 lb 4.8 oz (161.617 kg).  Physical Exam Physical Exam  Constitutional: She appears well-developed. No distress.  Obese, uses walker, supplemental O2  Pulmonary/Chest: Effort normal. No respiratory distress.  Psychiatric: She has a normal mood and affect. Her behavior is normal.    Data Reviewed [place], biopsy  Assessment    H/o PMP bleeding with serious medical problems, no bleeding now    Plan    Offer Mirena, RTC for insertion        Woodroe Mode 01/04/2014, 10:28 AM

## 2014-01-04 NOTE — Progress Notes (Signed)
Pt. Was seen here in November for post menopausal bleeding. Had an EMB 04/2013. Ultrasounds done as well. Nothing found. Pt. Was told she was not a candidate for surgery due to her other health issues. Pt. Was given Megace to help with bleeding in 07/2013. Pt. States it did not stop her bleeding, she felt like it made it worse so she stopped taking it. Pt.'s last episode of bleeding was February. Reports very very heavy bleeding to the point where she needs to use a chuck anywhere she sits. Pt. States "even when I am not bleeding I feel like I have an odor" which is something she is also concerned about. Pt. States "I'm not afraid to have a hysterectomy. I don't have a life now with all this bleeding"

## 2014-01-04 NOTE — Patient Instructions (Signed)
Postmenopausal Bleeding Postmenopausal bleeding is any bleeding a woman has after she has entered into menopause. Menopause is the end of a woman's fertile years. After menopause, a woman no longer ovulates or has menstrual periods.  Postmenopausal bleeding can be caused by various things. Any type of postmenopausal bleeding, even if it appears to be a typical menstrual period, is concerning. This should be evaluated by your health care provider. Any treatment will depend on the cause of the bleeding. HOME CARE INSTRUCTIONS Monitor your condition for any changes. The following actions may help to alleviate any discomfort you are experiencing:  Avoid the use of tampons and douches as directed by your health care provider.  Change your pads frequently.  Get regular pelvic exams and Pap tests.  Keep all follow-up appointments for diagnostic tests as directed by your health care provider. SEEK MEDICAL CARE IF:   Your bleeding lasts more than 1 week.  You have abdominal pain.  You have bleeding with sexual intercourse. SEEK IMMEDIATE MEDICAL CARE IF:   You have a fever, chills, headache, dizziness, muscle aches, and bleeding.  You have severe pain with bleeding.  You are passing blood clots.  You have bleeding and need more than 1 pad an hour.  You feel faint. MAKE SURE YOU:  Understand these instructions.  Will watch your condition.  Will get help right away if you are not doing well or get worse. Document Released: 12/15/2005 Document Revised: 06/27/2013 Document Reviewed: 04/05/2013 [facility] Patient Information 2014 [place], [place].

## 2014-01-31 ENCOUNTER — Other Ambulatory Visit: Payer: Self-pay

## 2014-02-04 ENCOUNTER — Encounter: Payer: Self-pay | Admitting: Internal Medicine

## 2014-02-04 ENCOUNTER — Ambulatory Visit (INDEPENDENT_AMBULATORY_CARE_PROVIDER_SITE_OTHER): Payer: Medicare Other | Admitting: Internal Medicine

## 2014-02-04 VITALS — BP 133/70 | HR 93 | Ht 65.0 in | Wt 354.1 lb

## 2014-02-04 DIAGNOSIS — I2510 Atherosclerotic heart disease of native coronary artery without angina pectoris: Secondary | ICD-10-CM | POA: Diagnosis not present

## 2014-02-04 DIAGNOSIS — I495 Sick sinus syndrome: Secondary | ICD-10-CM | POA: Diagnosis not present

## 2014-02-04 DIAGNOSIS — I4891 Unspecified atrial fibrillation: Secondary | ICD-10-CM | POA: Diagnosis not present

## 2014-02-04 DIAGNOSIS — I4892 Unspecified atrial flutter: Secondary | ICD-10-CM | POA: Diagnosis not present

## 2014-02-04 DIAGNOSIS — I509 Heart failure, unspecified: Secondary | ICD-10-CM

## 2014-02-04 DIAGNOSIS — R55 Syncope and collapse: Secondary | ICD-10-CM

## 2014-02-04 DIAGNOSIS — I5031 Acute diastolic (congestive) heart failure: Secondary | ICD-10-CM | POA: Diagnosis not present

## 2014-02-04 DIAGNOSIS — Z950 Presence of cardiac pacemaker: Secondary | ICD-10-CM

## 2014-02-04 NOTE — Assessment & Plan Note (Signed)
Her biotronik DDD PM is working normally. Will recheck in several months.

## 2014-02-04 NOTE — Patient Instructions (Signed)
Your physician wants you to follow-up in: 12 months with Dr Knox Saliva will receive a reminder letter in the mail two months in advance. If you don't receive a letter, please call our office to schedule the follow-up appointment.   Remote monitoring is used to monitor your Pacemaker of ICD from home. This monitoring reduces the number of office visits required to check your device to one time per year. It allows [place] to keep an eye on the functioning of your device to ensure it is working properly. You are scheduled for a device check from home on 05/08/14. You may send your transmission at any time that day. If you have a wireless device, the transmission will be sent automatically. After your physician reviews your transmission, you will receive a postcard with your next transmission date.

## 2014-02-04 NOTE — Progress Notes (Signed)
HPI [name] [name] returns today for followup. She is a pleasant obese 68 yo woman with a h/o syncope, subsequently found to be due to sinus node dysfunction, s/p PPM insertion. In the interim she has developed an allergy to cephalosporins and sloughed skin. She has recovered. She plans to start increasing her physical activity and lose weight. No syncope. She has peripheral edema and wears chronic oxygen.  Allergies  Allergen Reactions  . Cephalexin Other (See Comments)    Exfoliative dermatitis reaction  . Codeine     REACTION: unknown - pt. states unaware of having reaction to codeine  . Lisinopril     REACTION: cough  . Penicillins     rash     Current Outpatient Prescriptions  Medication Sig Dispense Refill  . allopurinol (ZYLOPRIM) 300 MG tablet Take 1 tablet (300 mg total) by mouth daily.  90 tablet  3  . amitriptyline (ELAVIL) 100 MG tablet TAKE 1 TABLET AT BEDTIME  90 tablet  1  . apixaban (ELIQUIS) 5 MG TABS tablet Take 1 tablet (5 mg total) by mouth 2 (two) times daily.  180 tablet  3  . Cholecalciferol (VITAMIN D) 2000 UNITS tablet Take 2,000 Units by mouth at bedtime.      . DULoxetine (CYMBALTA) 30 MG capsule Take 60 mg by mouth at bedtime.       . ferrous sulfate 325 (65 FE) MG tablet Take 325 mg by mouth daily with breakfast.      . furosemide (LASIX) 40 MG tablet Take 40 mg by mouth See admin instructions. Prn 1 extra tablet if needed.      [name] levothyroxine (SYNTHROID, LEVOTHROID) 200 MCG tablet Take 1 tablet (200 mcg total) by mouth daily.  90 tablet  3  . metoprolol tartrate (LOPRESSOR) 25 MG tablet Take 1 tablet (25 mg total) by mouth 2 (two) times daily.  180 tablet  3  . Omega-3 Fatty Acids (FISH OIL PO) Take 1 capsule by mouth daily.       . potassium chloride SA (K-DUR,KLOR-CON) 20 MEQ tablet Take 20 mEq by mouth 2 (two) times daily.      . pravastatin (PRAVACHOL) 40 MG tablet Take 1 tablet (40 mg total) by mouth every evening.  90 tablet  3  . vitamin C  (ASCORBIC ACID) 500 MG tablet Take 500 mg by mouth at bedtime.       No current facility-administered medications for this visit.     Past Medical History  Diagnosis Date  . Hypertension   . Hyperlipidemia   . Anemia   . CAD (coronary artery disease)     a. Nonobstructive CAD 2007 (EF 75%.  RCA  proximal 75% tubular, 25% prox LAD, 25% d1, 50% D2) at [facility]. b. NSTEMI 01/2010 in setting of respiratory failure, medical approach (not a good candidate for noninvasive eval)  . OSA on CPAP   . Hypothyroidism   . Asthma 06/13/2011  . Depression 06/13/2011  . Neuropathy 06/13/2011  . Cervical radiculopathy 06/13/2011  . Gout 06/13/2011  . Obesity hypoventilation syndrome 06/13/2011  . COPD (chronic obstructive pulmonary disease)     on O2  . Breast cancer dx'd 2005    left  . Cellulitis     hx of cellulitis in left leg  . Impaired glucose tolerance 11/01/2012  . PFO (patent foramen ovale)   . Atrial flutter   . Atrial fibrillation   . Syncope and collapse     Bradycardia with  pauses. S/P pacemaker    ROS:   All systems reviewed and negative except as noted in the HPI.   Past Surgical History  Procedure Laterality Date  . Ovarian cyst removal    . Breast lumpectomy    . Cervical disc surgery      have cadaver bones,, screws, and plates   . Cervical biopsy  June 2013    at [facility] for Heavy  Bleeding  . Dilation and curettage of uterus    . Colonoscopy    . Laparoscopic appendectomy  03/29/2012    Procedure: APPENDECTOMY LAPAROSCOPIC;  Surgeon: Adin Hector, MD;  Location: [facility];  Service: General;  Laterality: N/A;  . Tee without cardioversion N/A 03/15/2013    Procedure: TRANSESOPHAGEAL ECHOCARDIOGRAM (TEE);  Surgeon: Thayer Headings, MD;  Location: [place];  Service: Cardiovascular;  Laterality: N/A;  . Cardioversion N/A 03/15/2013    Procedure: CARDIOVERSION;  Surgeon: Thayer Headings, MD;  Location: [facility] ENDOSCOPY;  Service: Cardiovascular;  Laterality: N/A;  . Loop recorder  implant  08-28-2013; 08-31-2013    MDT LinQ implanted by Dr Rayann Heman for syncope; explanted 08-31-2013 after sinus pauses identified  . Pacemaker insertion  08-31-2013    Biotronik Evera dual chamber pacemaker placed for neurocardiogenic syncope and sinus pauses by Dr Rayann Heman     Family History  Problem Relation Age of Onset  . Breast cancer    . Uterine cancer Mother   . Cancer Mother     Theadora Rama  . Heart disease Mother   . Cancer Sister 48    breast  . Diabetes Brother   . Cancer Maternal Aunt     breast  . Cancer Cousin 40    breast     History   Social History  . Marital Status: Divorced    Spouse Name: N/A    Number of Children: N/A  . Years of Education: N/A   Occupational History  . Retired    Social History Main Topics  . Smoking status: Former Smoker -- 1.00 packs/day for 20 years    Types: Cigarettes    Quit date: 09/20/1998  . Smokeless tobacco: Never Used     Comment: 1ppd x 20 years  . Alcohol Use: 0.6 oz/week    1 Glasses of wine per week     Comment: rare  . Drug Use: No  . Sexual Activity: Not Currently    Birth Control/ Protection: Post-menopausal   Other Topics Concern  . Not on file   Social History Narrative   Lives alone in an apartment.     BP 133/70  Pulse 93  Ht 5' 5"  (1.651 m)  Wt 354 lb 1.9 oz (160.628 kg)  BMI 58.93 kg/m2  Physical Exam:  Obese appearing 68 yo woman wearing home oxygen, and in NAD HEENT: Unremarkable Neck:  No JVD, no thyromegally Back:  No CVA tenderness Lungs:  Clear with no wheezes; decreased breath sounds throughout. HEART:  Regular rate rhythm, no murmurs, no rubs, no clicks Abd:  soft, positive bowel sounds, no organomegally, no rebound, no guarding Ext:  2 plus pulses, no edema, no cyanosis, no clubbing Skin:  No rashes no nodules Neuro:  CN II through XII intact, motor grossly intact  DEVICE  Normal device function.  See PaceArt for details.   Assess/Plan:

## 2014-02-04 NOTE — Assessment & Plan Note (Signed)
She has had no recurrent syncope since her PPM insertion. We will follow.

## 2014-02-04 NOTE — Assessment & Plan Note (Signed)
Her symptoms are well compensated class 2. She will continue her current meds and I have asked her to maintain a low sodium diet and lose weight and increase her physical activity.

## 2014-02-05 LAB — MDC_IDC_ENUM_SESS_TYPE_INCLINIC
Battery Remaining Longevity: 10
Date Time Interrogation Session: 20150518163400
Implantable Pulse Generator Serial Number: 68181424
Lead Channel Impedance Value: 546 Ohm
Lead Channel Pacing Threshold Amplitude: 0.1 V
Lead Channel Pacing Threshold Amplitude: 1 V
Lead Channel Pacing Threshold Amplitude: 1.1 V
Lead Channel Pacing Threshold Amplitude: 1.1 V
Lead Channel Pacing Threshold Pulse Width: 0.4 ms
Lead Channel Pacing Threshold Pulse Width: 1 ms
Lead Channel Pacing Threshold Pulse Width: 1 ms
Lead Channel Pacing Threshold Pulse Width: 1 ms
Lead Channel Sensing Intrinsic Amplitude: 4 mV
Lead Channel Sensing Intrinsic Amplitude: 4 mV
Lead Channel Setting Pacing Amplitude: 2.6 V
Lead Channel Setting Pacing Pulse Width: 0.75 ms
MDC IDC MSMT LEADCHNL RA PACING THRESHOLD AMPLITUDE: 1 V
MDC IDC MSMT LEADCHNL RA PACING THRESHOLD PULSEWIDTH: 0.4 ms
MDC IDC MSMT LEADCHNL RV IMPEDANCE VALUE: 858 Ohm
MDC IDC MSMT LEADCHNL RV PACING THRESHOLD AMPLITUDE: 1.3 V
MDC IDC MSMT LEADCHNL RV PACING THRESHOLD AMPLITUDE: 1.7 V
MDC IDC MSMT LEADCHNL RV PACING THRESHOLD PULSEWIDTH: 0.4 ms
MDC IDC MSMT LEADCHNL RV PACING THRESHOLD PULSEWIDTH: 0.75 ms
MDC IDC MSMT LEADCHNL RV SENSING INTR AMPL: 10.5 mV
MDC IDC PG MODEL: 359529
MDC IDC SET LEADCHNL RA PACING AMPLITUDE: 2 V

## 2014-02-18 ENCOUNTER — Encounter: Payer: Self-pay | Admitting: Internal Medicine

## 2014-03-30 ENCOUNTER — Encounter: Payer: Self-pay | Admitting: Internal Medicine

## 2014-04-05 ENCOUNTER — Other Ambulatory Visit: Payer: Self-pay | Admitting: Internal Medicine

## 2014-05-14 ENCOUNTER — Ambulatory Visit (INDEPENDENT_AMBULATORY_CARE_PROVIDER_SITE_OTHER): Payer: Medicare Other | Admitting: Internal Medicine

## 2014-05-14 ENCOUNTER — Encounter: Payer: Self-pay | Admitting: Internal Medicine

## 2014-05-14 VITALS — BP 124/80 | HR 106 | Temp 98.0°F | Wt 348.0 lb

## 2014-05-14 DIAGNOSIS — I10 Essential (primary) hypertension: Secondary | ICD-10-CM | POA: Diagnosis not present

## 2014-05-14 DIAGNOSIS — I2510 Atherosclerotic heart disease of native coronary artery without angina pectoris: Secondary | ICD-10-CM

## 2014-05-14 DIAGNOSIS — R109 Unspecified abdominal pain: Secondary | ICD-10-CM

## 2014-05-14 DIAGNOSIS — Z23 Encounter for immunization: Secondary | ICD-10-CM | POA: Diagnosis not present

## 2014-05-14 DIAGNOSIS — R197 Diarrhea, unspecified: Secondary | ICD-10-CM

## 2014-05-14 NOTE — Assessment & Plan Note (Signed)
stable overall by history and exam, recent data reviewed with pt, and pt to continue medical treatment as before,  to f/u any worsening symptoms or concerns BP Readings from Last 3 Encounters:  05/14/14 124/80  02/04/14 133/70  01/04/14 124/84

## 2014-05-14 NOTE — Assessment & Plan Note (Signed)
?   IBS like vs other - exam benign today, no fever or blood, has had some wt loss for unclear reason, for stool studies at this time, immodium prn,  to f/u any worsening symptoms or concerns

## 2014-05-14 NOTE — Progress Notes (Signed)
Pre visit review using our clinic review tool, if applicable. No additional management support is needed unless otherwise documented below in the visit note. 

## 2014-05-14 NOTE — Assessment & Plan Note (Signed)
Lab Results  Component Value Date   WBC 5.8 12/26/2013   HGB 13.5 12/26/2013   HCT 42.1 12/26/2013   PLT 165 12/26/2013   GLUCOSE 100 12/26/2013   CHOL 141 11/13/2013   TRIG 109.0 11/13/2013   HDL 41.80 11/13/2013   LDLCALC 77 11/13/2013   ALT 40 12/26/2013   AST 40* 12/26/2013   NA 144 12/26/2013   K 3.8 12/26/2013   CL 100 12/03/2013   CREATININE 1.0 12/26/2013   BUN 14.1 12/26/2013   CO2 33* 12/26/2013   TSH 1.585 08/27/2013   INR 1.19 08/30/2013   HGBA1C 6.1 11/01/2012  mild recurrent, for stool studies as above, labs reviewed with pt, ok to hold on further lab today, has been referred to GI for pain and diarrhea ongoing

## 2014-05-14 NOTE — Patient Instructions (Addendum)
You are given the handicap parking form filled out today  You had the flu shot today  You will be contacted regarding the referral for: Gastroenterology, as you also need a colonoscopy  OK to take immodium as needed for now  Please continue all other medications as before, and refills have been done if requested.  Please have the pharmacy call with any other refills you may need.  Please continue your efforts at being more active, low cholesterol diet, and weight control.  Please keep your appointments with your specialists as you may have planned  Please go to the LAB in the Basement (turn left off the elevator) for the tests to be done today - just the stool tests today  You will be contacted by phone if any changes need to be made immediately.  Otherwise, you will receive a letter about your results with an explanation, but please check with MyChart first.  Please remember to sign up for MyChart if you have not done so, as this will be important to you in the future with finding out test results, communicating by private email, and scheduling acute appointments online when needed.

## 2014-05-14 NOTE — Progress Notes (Signed)
Subjective:    Patient ID: [name] [name], female    DOB: April 19, 1946, 68 y.o.   MRN: [id number]  HPI  Here to f/u; overall doing ok,  Pt denies chest pain, increased sob or doe, wheezing, orthopnea, PND, increased LE swelling, palpitations, dizziness or syncope.  Pt denies polydipsia, polyuria, or low sugar symptoms such as weakness or confusion improved with po intake.  Pt denies new neurological symptoms such as new headache, or facial or extremity weakness or numbness.   Pt states overall good compliance with meds, has been trying to follow lower cholesterol diet, with wt overall stable,  but little exercise however. Walks with walker, needs handicap parking form.  Main complaint today is 2 mo unusual diarrheal episodes, No prior hx of significant GI complaints in past, no change in meds recent, last TSH normal dec 2014.  Diarrhea is watery, with some increased bowel freq, with most days cant make it to BR with accidents twice per day, wearing pads to keep clean.  Also with recurring crampy abd pain, worse with eating,  No n/v, dysphagia.  No fever, no bleed she is aware of, but does have dark stool on the iron.  S/p Appy.   Pt denies fever,  night sweats, loss of appetite, or other constitutional symptoms. Has lost 8 lbs since April 2015, not trying to lose wt. Overdue for colonscopy f/u.  Not tried immodium. Wt Readings from Last 3 Encounters:  05/14/14 348 lb (157.852 kg)  02/04/14 354 lb 1.9 oz (160.628 kg)  01/04/14 356 lb 4.8 oz (161.617 kg)  Does c/o ongoing fatigue, but denies signficant daytime hypersomnolence, uses CPAP religiously now.    Past Medical History  Diagnosis Date  . Hypertension   . Hyperlipidemia   . Anemia   . CAD (coronary artery disease)     a. Nonobstructive CAD 2007 (EF 75%.  RCA  proximal 75% tubular, 25% prox LAD, 25% d1, 50% D2) at [facility]. b. NSTEMI 01/2010 in setting of respiratory failure, medical approach (not a good candidate for noninvasive eval)  . OSA on CPAP    . Hypothyroidism   . Asthma 06/13/2011  . Depression 06/13/2011  . Neuropathy 06/13/2011  . Cervical radiculopathy 06/13/2011  . Gout 06/13/2011  . Obesity hypoventilation syndrome 06/13/2011  . COPD (chronic obstructive pulmonary disease)     on O2  . Breast cancer dx'd 2005    left  . Cellulitis     hx of cellulitis in left leg  . Impaired glucose tolerance 11/01/2012  . PFO (patent foramen ovale)   . Atrial flutter   . Atrial fibrillation   . Syncope and collapse     Bradycardia with pauses. S/P pacemaker   Past Surgical History  Procedure Laterality Date  . Ovarian cyst removal    . Breast lumpectomy    . Cervical disc surgery      have cadaver bones,, screws, and plates   . Cervical biopsy  June 2013    at [facility] for Heavy  Bleeding  . Dilation and curettage of uterus    . Colonoscopy    . Laparoscopic appendectomy  03/29/2012    Procedure: APPENDECTOMY LAPAROSCOPIC;  Surgeon: Adin Hector, MD;  Location: [facility];  Service: General;  Laterality: N/A;  . Tee without cardioversion N/A 03/15/2013    Procedure: TRANSESOPHAGEAL ECHOCARDIOGRAM (TEE);  Surgeon: Thayer Headings, MD;  Location: [place];  Service: Cardiovascular;  Laterality: N/A;  . Cardioversion N/A 03/15/2013    Procedure:  CARDIOVERSION;  Surgeon: Thayer Headings, MD;  Location: [facility] ENDOSCOPY;  Service: Cardiovascular;  Laterality: N/A;  . Loop recorder implant  08-28-2013; 08-31-2013    MDT LinQ implanted by Dr Rayann Heman for syncope; explanted 08-31-2013 after sinus pauses identified  . Pacemaker insertion  08-31-2013    Biotronik Evera dual chamber pacemaker placed for neurocardiogenic syncope and sinus pauses by Dr Rayann Heman    reports that she quit smoking about 15 years ago. Her smoking use included Cigarettes. She has a 20 pack-year smoking history. She has never used smokeless tobacco. She reports that she drinks about .6 ounces of alcohol per week. She reports that she does not use illicit drugs. family history  includes Breast cancer in an other family member; Cancer in her maternal aunt and mother; Cancer (age of onset: 8) in her sister; Cancer (age of onset: 38) in her cousin; Diabetes in her brother; Heart disease in her mother; Uterine cancer in her mother. Allergies  Allergen Reactions  . Cephalexin Other (See Comments)    Exfoliative dermatitis reaction  . Codeine     REACTION: unknown - pt. states unaware of having reaction to codeine  . Lisinopril     REACTION: cough  . Penicillins     rash   Current Outpatient Prescriptions on File Prior to Visit  Medication Sig Dispense Refill  . allopurinol (ZYLOPRIM) 300 MG tablet Take 1 tablet (300 mg total) by mouth daily.  90 tablet  3  . amitriptyline (ELAVIL) 100 MG tablet TAKE 1 TABLET AT BEDTIME  90 tablet  1  . apixaban (ELIQUIS) 5 MG TABS tablet Take 1 tablet (5 mg total) by mouth 2 (two) times daily.  180 tablet  3  . Cholecalciferol (VITAMIN D) 2000 UNITS tablet Take 2,000 Units by mouth at bedtime.      . DULoxetine (CYMBALTA) 30 MG capsule Take 60 mg by mouth at bedtime.       . ferrous sulfate 325 (65 FE) MG tablet Take 325 mg by mouth daily with breakfast.      . furosemide (LASIX) 40 MG tablet Take 40 mg by mouth See admin instructions. Prn 1 extra tablet if needed.      [name] levothyroxine (SYNTHROID, LEVOTHROID) 200 MCG tablet Take 1 tablet (200 mcg total) by mouth daily.  90 tablet  3  . metoprolol tartrate (LOPRESSOR) 25 MG tablet Take 1 tablet (25 mg total) by mouth 2 (two) times daily.  180 tablet  3  . Omega-3 Fatty Acids (FISH OIL PO) Take 1 capsule by mouth daily.       . potassium chloride SA (K-DUR,KLOR-CON) 20 MEQ tablet Take 20 mEq by mouth 2 (two) times daily.      . pravastatin (PRAVACHOL) 40 MG tablet Take 1 tablet (40 mg total) by mouth every evening.  90 tablet  3  . vitamin C (ASCORBIC ACID) 500 MG tablet Take 500 mg by mouth at bedtime.       No current facility-administered medications on file prior to visit.      Review of Systems  Constitutional: Negative for unusual diaphoresis or other sweats  HENT: Negative for ringing in ear Eyes: Negative for double vision or worsening visual disturbance.  Respiratory: Negative for choking and stridor.   Gastrointestinal: Negative for vomiting or other signifcant bowel change Genitourinary: Negative for hematuria or decreased urine volume.  Musculoskeletal: Negative for other MSK pain or swelling Skin: Negative for color change and worsening wound.  Neurological: Negative for tremors  and numbness other than noted  Psychiatric/Behavioral: Negative for decreased concentration or agitation other than above       Objective:   Physical Exam BP 124/80  Pulse 106  Temp(Src) 98 F (36.7 C) (Oral)  Wt 348 lb (157.852 kg)  SpO2 96% VS noted, walks with seated walker easily, wearing home o2 Constitutional: Pt appears well-developed, well-nourished. /morbid obese HENT: Head: NCAT.  Right Ear: External ear normal.  Left Ear: External ear normal.  Eyes: . Pupils are equal, round, and reactive to light. Conjunctivae and EOM are normal Neck: Normal range of motion. Neck supple.  Cardiovascular: Normal rate and regular rhythm.   Pulmonary/Chest: Effort normal and breath sounds decreased, no rales or wheezing.  Abd:  Soft, NT, ND, + BS Neurological: Pt is alert. Not confused , motor grossly intact Skin: Skin is warm. No rash, trace pedal edema Psychiatric: Pt behavior is normal. No agitation.      Assessment & Plan:

## 2014-05-21 ENCOUNTER — Encounter: Payer: Self-pay | Admitting: Gastroenterology

## 2014-05-23 ENCOUNTER — Ambulatory Visit
Admission: RE | Admit: 2014-05-23 | Discharge: 2014-05-23 | Disposition: A | Discharge status: Home / Self Care | Payer: Medicare Other | Source: Ambulatory Visit | Attending: Internal Medicine | Admitting: Internal Medicine

## 2014-05-23 DIAGNOSIS — C50919 Malignant neoplasm of unspecified site of unspecified female breast: Secondary | ICD-10-CM

## 2014-05-23 DIAGNOSIS — Z853 Personal history of malignant neoplasm of breast: Secondary | ICD-10-CM | POA: Diagnosis not present

## 2014-05-23 DIAGNOSIS — N6459 Other signs and symptoms in breast: Secondary | ICD-10-CM | POA: Diagnosis not present

## 2014-05-28 ENCOUNTER — Other Ambulatory Visit: Payer: Self-pay | Admitting: Internal Medicine

## 2014-05-31 ENCOUNTER — Encounter: Payer: Self-pay | Admitting: Gastroenterology

## 2014-05-31 ENCOUNTER — Ambulatory Visit (INDEPENDENT_AMBULATORY_CARE_PROVIDER_SITE_OTHER): Payer: Medicare Other | Admitting: Gastroenterology

## 2014-05-31 ENCOUNTER — Telehealth: Payer: Self-pay | Admitting: *Deleted

## 2014-05-31 ENCOUNTER — Other Ambulatory Visit (INDEPENDENT_AMBULATORY_CARE_PROVIDER_SITE_OTHER): Payer: Medicare Other

## 2014-05-31 VITALS — BP 120/80 | HR 80 | Ht 64.0 in | Wt 344.5 lb

## 2014-05-31 DIAGNOSIS — R1011 Right upper quadrant pain: Secondary | ICD-10-CM

## 2014-05-31 DIAGNOSIS — R1900 Intra-abdominal and pelvic swelling, mass and lump, unspecified site: Secondary | ICD-10-CM

## 2014-05-31 DIAGNOSIS — R197 Diarrhea, unspecified: Secondary | ICD-10-CM | POA: Diagnosis not present

## 2014-05-31 LAB — CBC WITH DIFFERENTIAL/PLATELET
BASOS PCT: 0.4 % (ref 0.0–3.0)
Basophils Absolute: 0 10*3/uL (ref 0.0–0.1)
EOS PCT: 3.4 % (ref 0.0–5.0)
Eosinophils Absolute: 0.2 10*3/uL (ref 0.0–0.7)
HCT: 43.5 % (ref 36.0–46.0)
Hemoglobin: 14.1 g/dL (ref 12.0–15.0)
Lymphocytes Relative: 31.5 % (ref 12.0–46.0)
Lymphs Abs: 2.1 10*3/uL (ref 0.7–4.0)
MCHC: 32.4 g/dL (ref 30.0–36.0)
MCV: 92.1 fl (ref 78.0–100.0)
Monocytes Absolute: 0.5 10*3/uL (ref 0.1–1.0)
Monocytes Relative: 7.5 % (ref 3.0–12.0)
NEUTROS PCT: 57.2 % (ref 43.0–77.0)
Neutro Abs: 3.8 10*3/uL (ref 1.4–7.7)
PLATELETS: 199 10*3/uL (ref 150.0–400.0)
RBC: 4.72 Mil/uL (ref 3.87–5.11)
RDW: 14.7 % (ref 11.5–15.5)
WBC: 6.7 10*3/uL (ref 4.0–10.5)

## 2014-05-31 LAB — IGA: IgA: 226 mg/dL (ref 68–378)

## 2014-05-31 LAB — COMPREHENSIVE METABOLIC PANEL
ALT: 39 U/L — ABNORMAL HIGH (ref 0–35)
AST: 50 U/L — ABNORMAL HIGH (ref 0–37)
Albumin: 4 g/dL (ref 3.5–5.2)
Alkaline Phosphatase: 104 U/L (ref 39–117)
BUN: 12 mg/dL (ref 6–23)
CALCIUM: 9.7 mg/dL (ref 8.4–10.5)
CHLORIDE: 101 meq/L (ref 96–112)
CO2: 30 mEq/L (ref 19–32)
Creatinine, Ser: 1 mg/dL (ref 0.4–1.2)
GFR: 74.35 mL/min (ref >60.00)
GLUCOSE: 106 mg/dL — AB (ref 70–99)
Potassium: 4.1 mEq/L (ref 3.5–5.1)
SODIUM: 141 meq/L (ref 135–145)
TOTAL PROTEIN: 8.1 g/dL (ref 6.0–8.3)
Total Bilirubin: 0.5 mg/dL (ref 0.2–1.2)

## 2014-05-31 LAB — C-REACTIVE PROTEIN: CRP: 1.4 mg/dL (ref 0.5–20.0)

## 2014-05-31 LAB — SEDIMENTATION RATE: SED RATE: 32 mm/h — AB (ref 0–22)

## 2014-05-31 LAB — TSH: TSH: 1.79 u[IU]/mL (ref 0.35–4.50)

## 2014-05-31 MED ORDER — MOVIPREP 100 G PO SOLR: 1.0000 | Freq: Once | ORAL | Status: DC

## 2014-05-31 NOTE — Telephone Encounter (Signed)
LMOM at home and cell for patient to call office back.

## 2014-05-31 NOTE — Patient Instructions (Signed)
You have been scheduled for a colonoscopy at [facility] with Dr. Hilarie Fredrickson. Please follow written instructions given to you at your visit today.  Please pick up your prep kit at the pharmacy within the next 1-3 days. If you use inhalers (even only as needed), please bring them with you on the day of your procedure. Your physician has requested that you go to www.startemmi.com and enter the access code given to you at your visit today. This web site gives a general overview about your procedure. However, you should still follow specific instructions given to you by our office regarding your preparation for the procedure.  You have been scheduled for an abdominal ultrasound at [facility] Radiology (1st floor of hospital) on 06-04-2014 at 8:30 AM. Please arrive 15 minutes prior to your appointment for registration. Make certain not to have anything to eat or drink 6 hours prior to your appointment. Should you need to reschedule your appointment, please contact radiology at [phone]. This test typically takes about 30 minutes to perform.  Your physician has requested that you go to the basement for the following lab work before leaving today: CBC CMP TSH  CRP TTG IGA  SED RATE  STOOL STUDIES

## 2014-05-31 NOTE — Addendum Note (Signed)
Addended by: Hope Pigeon A on: 05/31/2014 12:35 PM   Modules accepted: Orders

## 2014-05-31 NOTE — Telephone Encounter (Signed)
Dionicio Stall, RN at 05/31/2014  2:18 PM      Status: Signed            Per Dr Lovena Le may proceed with Colonoscopy.  Hold Eliquis 48 hours prior to procedure and restart when MD feels appropriate based on procedure         Carlyle Dolly, [place] at 05/31/2014 10:31 AM      Status: Signed            05/31/2014     RE:      [name] [name] DOB:   28-May-1946 MRN:   [id number]     Dear Dr. Lovena Le,      We have scheduled the above patient for a Colonoscopy at [facility] with Dr. Hilarie Fredrickson. Our records show that she is on anticoagulation therapy.    Please advise as to how long the patient may come off her therapy of Eliquis prior to the procedure, which is scheduled for 06-17-2014.   Please fax back/ or route the completed form to Evette Georges., CMA   Sincerely,       Hope Pigeon

## 2014-05-31 NOTE — Progress Notes (Addendum)
05/31/2014 [name] [name] [id number] 06/16/1946   HISTORY OF PRESENT ILLNESS:  This is a 68 year old female who is new to our practice and presents to our office today and has been referred by her PCP, Dr. Jenny Reichmann, with a couple of different complaints. She has multiple medical problems including history of breast cancer requiring chemotherapy and radiation, pacemaker placement in December 2014 for which she is now on Eliquis, hypertension, hyperlipidemia, history of coronary artery disease, obesity hypoventilation syndrome for which she has been on oxygen for the past 3 years. She had an appendectomy in July 2013. Her main issue is diarrhea, which began about 2-3 months ago. She says that prior to this diarrhea she was having one solid bowel movement daily, usually in the evening. Now she is having several loose to watery bowel movements daily that come without warning and she's been having incontinence/accidents. She says that within 1-2 hours of eating she is having diarrhea and she is seeing the food in her stool that she had eaten earlier that same day. She denies ever seeing blood in her stools, but states that they have been dark in color since beginning iron supplementation several months ago. She says that the iron supplements were started due to heavy vaginal bleeding that she was having. The vaginal bleeding resolved several months ago, however, she was told to continue her iron supplementation. We do not have any recent labs on her. She saw her PCP, Dr. Jenny Reichmann, who ordered stool for C. difficile and culture for enteric pathogens, however, she has not had this performed because she wanted to be seen here first. She says that the diarrhea is explosive and just runs out of her.  She denies any travel.  Was on Keflex in early springtime.  She cannot identify certain foods that cause her symptoms because it occurs with everything that she eats.  She has been using Imodium on occasion, which seems to help.  She also complains of general diffuse abdominal discomfort/cramping and a lot of rumbling in her stomach. She complains of pain in her right upper quadrant and describes the sensation of a lump or swollen area in the right upper quadrant. She tells me that she had a colonoscopy at [facility] more than 5 or 6 years ago and that she had some polyps removed at that time.    Past Medical History  Diagnosis Date  . Hypertension   . Hyperlipidemia   . Anemia   . CAD (coronary artery disease)     a. Nonobstructive CAD 2007 (EF 75%.  RCA  proximal 75% tubular, 25% prox LAD, 25% d1, 50% D2) at [facility]. b. NSTEMI 01/2010 in setting of respiratory failure, medical approach (not a good candidate for noninvasive eval)  . OSA on CPAP   . Hypothyroidism   . Asthma 06/13/2011  . Depression 06/13/2011  . Neuropathy 06/13/2011  . Cervical radiculopathy 06/13/2011  . Gout 06/13/2011  . Obesity hypoventilation syndrome 06/13/2011  . COPD (chronic obstructive pulmonary disease)     on O2  . Breast cancer dx'd 2005    left  . Cellulitis     hx of cellulitis in left leg  . Impaired glucose tolerance 11/01/2012  . PFO (patent foramen ovale)   . Atrial flutter   . Atrial fibrillation   . Syncope and collapse     Bradycardia with pauses. S/P pacemaker  . Risk for falls    Past Surgical History  Procedure Laterality Date  . Ovarian cyst  removal    . Breast lumpectomy Left   . Cervical disc surgery      have cadaver bones,, screws, and plates   . Cervical biopsy  June 2013    at [facility] for Heavy  Bleeding  . Dilation and curettage of uterus    . Colonoscopy    . Laparoscopic appendectomy  03/29/2012    Procedure: APPENDECTOMY LAPAROSCOPIC;  Surgeon: Adin Hector, MD;  Location: [facility];  Service: General;  Laterality: N/A;  . Tee without cardioversion N/A 03/15/2013    Procedure: TRANSESOPHAGEAL ECHOCARDIOGRAM (TEE);  Surgeon: Thayer Headings, MD;  Location: [facility];  Service: Cardiovascular;  Laterality: N/A;    . Cardioversion N/A 03/15/2013    Procedure: CARDIOVERSION;  Surgeon: Thayer Headings, MD;  Location: [facility] ENDOSCOPY;  Service: Cardiovascular;  Laterality: N/A;  . Loop recorder implant  08-28-2013; 08-31-2013    MDT LinQ implanted by Dr Rayann Heman for syncope; explanted 08-31-2013 after sinus pauses identified  . Pacemaker insertion  08-31-2013    Biotronik Evera dual chamber pacemaker placed for neurocardiogenic syncope and sinus pauses by Dr Rayann Heman    reports that she quit smoking about 15 years ago. Her smoking use included Cigarettes. She has a 20 pack-year smoking history. She has never used smokeless tobacco. She reports that she drinks about .6 ounces of alcohol per week. She reports that she does not use illicit drugs. family history includes Breast cancer in her maternal aunt; Breast cancer (age of onset: 99) in her sister; Breast cancer (age of onset: 58) in her cousin; Diabetes in her brother; Heart disease in her mother; Ovarian cancer in her sister; Uterine cancer in her mother. Allergies  Allergen Reactions  . Cephalexin Other (See Comments)    Exfoliative dermatitis reaction  . Codeine     REACTION: unknown - pt. states unaware of having reaction to codeine  . Lisinopril     REACTION: cough  . Penicillins     rash      Outpatient Encounter Prescriptions as of 05/31/2014  Medication Sig  . allopurinol (ZYLOPRIM) 300 MG tablet Take 1 tablet (300 mg total) by mouth daily.  [name] amitriptyline (ELAVIL) 100 MG tablet TAKE 1 TABLET AT BEDTIME  . apixaban (ELIQUIS) 5 MG TABS tablet Take 1 tablet (5 mg total) by mouth 2 (two) times daily.  . Cholecalciferol (VITAMIN D) 2000 UNITS tablet Take 2,000 Units by mouth at bedtime.  . DULoxetine (CYMBALTA) 30 MG capsule Take 60 mg by mouth at bedtime.   . ferrous sulfate 325 (65 FE) MG tablet Take 325 mg by mouth daily with breakfast.  . furosemide (LASIX) 40 MG tablet TAKE 1 TABLET EVERY MORNING AND TAKE 1 TABLET EVERY OTHER NIGHT AS DIRECTED   . levothyroxine (SYNTHROID, LEVOTHROID) 200 MCG tablet Take 1 tablet (200 mcg total) by mouth daily.  . metoprolol tartrate (LOPRESSOR) 25 MG tablet Take 1 tablet (25 mg total) by mouth 2 (two) times daily.  . Omega-3 Fatty Acids (FISH OIL PO) Take 1 capsule by mouth daily.   . OXYGEN Inhale into the lungs. Continuous O2 @ 2LMP  . potassium chloride SA (K-DUR,KLOR-CON) 20 MEQ tablet Take 20 mEq by mouth 2 (two) times daily.  . pravastatin (PRAVACHOL) 40 MG tablet Take 1 tablet (40 mg total) by mouth every evening.  . vitamin C (ASCORBIC ACID) 500 MG tablet Take 500 mg by mouth at bedtime.  [name] MOVIPREP 100 G SOLR Take 1 kit (200 g total) by mouth once.  . [  DISCONTINUED] furosemide (LASIX) 40 MG tablet Take 40 mg by mouth See admin instructions. Prn 1 extra tablet if needed.     REVIEW OF SYSTEMS  : All other systems reviewed and negative except where noted in the History of Present Illness.   PHYSICAL EXAM: BP 120/80  Pulse 80  Ht 5' 4"  (1.626 m)  Wt 344 lb 8 oz (156.264 kg)  BMI 59.10 kg/m2 General: Well developed black female in no acute distress Head: Normocephalic and atraumatic Eyes:  Sclerae anicteric, conjunctiva pink. Ears: Normal auditory acuity.  Lungs: Clear throughout to auscultation Heart: Regular rate and rhythm Abdomen: Soft, obese, non-distended.  Normal bowel sounds.  Suspected ventral hernia in mid-upper abdomen/epigastrium. Rectal:  Deferred.  Will be done at the time of colonoscopy. Musculoskeletal: Symmetrical with no gross deformities  Skin: No lesions on visible extremities Extremities:  Some edema and chronic skin changes in B/L LE's Neurological: Alert oriented x 4, grossly non-focal Psychological:  Alert and cooperative. Normal mood and affect  ASSESSMENT AND PLAN: -Diarrhea with urgency and incontinence:  Subacute x 2-3 months, but definite change in bowel habits.  We will check labs including CBC, CMP, TSH, celiac labs, sed rate, and CRP.  Stool studies  were ordered by her PCP and she has not done those yet so I have asked her to turn those in (Cdiff and culture).  Will schedule for colonoscopy tentatively assuming that stool studies will be negative.  If they are positive then will treat appropriately and can possibly cancel colonoscopy.  She is on Eliquis so we will get clearance to hold that from her cardiologist, Dr. Stanford Breed.  The risks, benefits, and alternatives were discussed with the patient and she consents to proceed.  The risks benefits and alternatives to a temporary hold of anti-coagulants/anti-platelets for the procedure were discussed with the patient and she consents to proceed. Obtain clearance from Dr. Stanford Breed.  She will continue to use Imodium prn in the interim. -RUQ abdominal pain/"knot in RUQ":  She has a ventral hernia in epigastrium, but will check RUQ abdominal ultrasound to rule out any other issues although I am unsure if this will be a great study due to her weight/body habitus.    Addendum: Reviewed and agree with initial management. If [place] is inadquate then would recommend cross-sectional imaging for RUQ (should this pain continue) Jerene Bears, MD

## 2014-05-31 NOTE — Telephone Encounter (Signed)
05/31/2014   RE: [name] [name] DOB: April 25, 1946 MRN: [id number]   Dear Dr. Lovena Le,    We have scheduled the above patient for a Colonoscopy at [facility] with Dr. Hilarie Fredrickson. Our records show that she is on anticoagulation therapy.   Please advise as to how long the patient may come off her therapy of Eliquis prior to the procedure, which is scheduled for 06-17-2014.  Please fax back/ or route the completed form to Evette Georges., CMA  Sincerely,    Hope Pigeon

## 2014-05-31 NOTE — Telephone Encounter (Signed)
Per Dr Lovena Le may proceed with Colonoscopy.  Hold Eliquis 48 hours prior to procedure and restart when MD feels appropriate based on procedure

## 2014-06-02 ENCOUNTER — Encounter: Payer: Self-pay | Admitting: Internal Medicine

## 2014-06-03 ENCOUNTER — Other Ambulatory Visit: Payer: Self-pay

## 2014-06-03 ENCOUNTER — Other Ambulatory Visit: Payer: Medicare Other

## 2014-06-03 DIAGNOSIS — R197 Diarrhea, unspecified: Secondary | ICD-10-CM | POA: Diagnosis not present

## 2014-06-03 LAB — T-TRANSGLUTAMINASE (TTG) IGG: Tissue Transglut Ab: 2 U/mL (ref 0–5)

## 2014-06-03 MED ORDER — AMITRIPTYLINE HCL 100 MG PO TABS: ORAL_TABLET | ORAL | Status: DC

## 2014-06-03 NOTE — Telephone Encounter (Signed)
Patient called back. I advised patient okay for her to hold Eliquis 48 hrs prior to procedure per Dr. Lovena Le. Patient verbalized understanding.

## 2014-06-04 ENCOUNTER — Ambulatory Visit (HOSPITAL_COMMUNITY)
Admission: RE | Admit: 2014-06-04 | Discharge: 2014-06-04 | Disposition: A | Discharge status: Home / Self Care | Payer: Medicare Other | Source: Ambulatory Visit | Attending: Gastroenterology | Admitting: Gastroenterology

## 2014-06-04 ENCOUNTER — Ambulatory Visit (INDEPENDENT_AMBULATORY_CARE_PROVIDER_SITE_OTHER): Payer: Medicare Other | Admitting: Cardiology

## 2014-06-04 ENCOUNTER — Telehealth: Payer: Self-pay | Admitting: Gastroenterology

## 2014-06-04 ENCOUNTER — Other Ambulatory Visit: Payer: Self-pay | Admitting: Internal Medicine

## 2014-06-04 ENCOUNTER — Encounter: Payer: Self-pay | Admitting: Cardiology

## 2014-06-04 VITALS — BP 138/76 | HR 99 | Temp 98.4°F | Wt 343.2 lb

## 2014-06-04 DIAGNOSIS — I5031 Acute diastolic (congestive) heart failure: Secondary | ICD-10-CM

## 2014-06-04 DIAGNOSIS — I480 Paroxysmal atrial fibrillation: Secondary | ICD-10-CM

## 2014-06-04 DIAGNOSIS — R1011 Right upper quadrant pain: Secondary | ICD-10-CM | POA: Diagnosis not present

## 2014-06-04 DIAGNOSIS — I2510 Atherosclerotic heart disease of native coronary artery without angina pectoris: Secondary | ICD-10-CM

## 2014-06-04 DIAGNOSIS — I4891 Unspecified atrial fibrillation: Secondary | ICD-10-CM | POA: Diagnosis not present

## 2014-06-04 DIAGNOSIS — E785 Hyperlipidemia, unspecified: Secondary | ICD-10-CM

## 2014-06-04 DIAGNOSIS — I10 Essential (primary) hypertension: Secondary | ICD-10-CM | POA: Diagnosis not present

## 2014-06-04 DIAGNOSIS — I509 Heart failure, unspecified: Secondary | ICD-10-CM

## 2014-06-04 DIAGNOSIS — Z950 Presence of cardiac pacemaker: Secondary | ICD-10-CM

## 2014-06-04 DIAGNOSIS — E669 Obesity, unspecified: Secondary | ICD-10-CM | POA: Insufficient documentation

## 2014-06-04 DIAGNOSIS — R1900 Intra-abdominal and pelvic swelling, mass and lump, unspecified site: Secondary | ICD-10-CM

## 2014-06-04 LAB — C. DIFFICILE GDH AND TOXIN A/B
C. DIFF TOXIN A/B: DETECTED — AB
C. difficile GDH: DETECTED — AB

## 2014-06-04 NOTE — Assessment & Plan Note (Signed)
Continue present blood pressure medications. 

## 2014-06-04 NOTE — Assessment & Plan Note (Signed)
Continue statin. Not on aspirin given need for anticoagulation.

## 2014-06-04 NOTE — Patient Instructions (Signed)
Your physician wants you to follow-up in: 6 MONTHS WITH DR CRENSHAW You will receive a reminder letter in the mail two months in advance. If you don't receive a letter, please call our office to schedule the follow-up appointment.  

## 2014-06-04 NOTE — Telephone Encounter (Signed)
Patient had ultrasound this AM. She states she got up from a nap and got a shower. She started having cramping and had bright, red blood that she is not sure if it is from her vagina or rectum. States she has had sporadic vaginal bleeding in the past.She is going to see her cardiologist at 4:00 PM and is currently on her way there. She just wanted to let Alonza Bogus, PA know about the bleeding. States she will call back tomorrow with update. She understands to go to ED for heavy bleeding or pain.

## 2014-06-04 NOTE — Assessment & Plan Note (Signed)
Euvolemic on examination.Continue present dose of Lasix.

## 2014-06-04 NOTE — Assessment & Plan Note (Signed)
Continue statin. 

## 2014-06-04 NOTE — Assessment & Plan Note (Signed)
Patient remains in sinus rhythm. Continue beta blocker. Continue apixaban.

## 2014-06-04 NOTE — Assessment & Plan Note (Signed)
Followed by electrophysiology. 

## 2014-06-04 NOTE — Progress Notes (Signed)
HPI: fu atrial flutter. Also with hx of CAD, HTN, HL, OSA, hypothyroidism, COPD on O2, L breast CA. LHC at [facility] 10/2005: pRCA 75%, LM < 25%, pLAD 25%, D1 25%, D2 50%. Carotid Dopplers June 2013 showed no significant stenosis. She suffered a NSTEMI in 01/2010 in the setting of SIRS from leg cellulitis. This was felt to be demand ischemia. Med Rx was pursued (felt to be a poor candidate for noninvasive imaging). Echo 02/2012: EF 55-60%, mild AI, MAC, mod LAE, PASP mildly increased. Patient had atrial flutter with RVR 6/14. She was felt to be a poor candidate for atrial flutter ablation. TEE guided cardioversion was performed. She had restoration of NSR. TEE 03/15/13: EF 55-60%, no LA clot, positive PFO, mild to moderate TR. Patient admitted in December of 2014 following a syncopal episode. She had an implantable loop placed which demonstrated symptomatic bradycardia with pauses and ultimately had a pacemaker placed. Since last seen, She has mild dyspnea on exertion. No exertional chest pain, palpitations or syncope.   Current Outpatient Prescriptions  Medication Sig Dispense Refill  . allopurinol (ZYLOPRIM) 300 MG tablet Take 1 tablet (300 mg total) by mouth daily.  90 tablet  3  . amitriptyline (ELAVIL) 100 MG tablet TAKE 1 TABLET AT BEDTIME  90 tablet  1  . Cholecalciferol (VITAMIN D) 2000 UNITS tablet Take 2,000 Units by mouth at bedtime.      . DULoxetine (CYMBALTA) 30 MG capsule Take 60 mg by mouth at bedtime.       [name] ELIQUIS 5 MG TABS tablet TAKE 1 TABLET TWICE A DAY  180 tablet  2  . ferrous sulfate 325 (65 FE) MG tablet Take 325 mg by mouth daily with breakfast.      . furosemide (LASIX) 40 MG tablet TAKE 1 TABLET EVERY MORNING AND TAKE 1 TABLET EVERY OTHER NIGHT AS DIRECTED  90 tablet  3  . levothyroxine (SYNTHROID, LEVOTHROID) 200 MCG tablet TAKE 1 TABLET DAILY  90 tablet  2  . metoprolol tartrate (LOPRESSOR) 25 MG tablet Take 1 tablet (25 mg total) by mouth 2 (two) times daily.  180  tablet  3  . MOVIPREP 100 G SOLR Take 1 kit (200 g total) by mouth once.  1 kit  0  . Omega-3 Fatty Acids (FISH OIL PO) Take 1 capsule by mouth daily.       . OXYGEN Inhale into the lungs. Continuous O2 @ 2LMP      . potassium chloride SA (K-DUR,KLOR-CON) 20 MEQ tablet Take 20 mEq by mouth 2 (two) times daily.      . pravastatin (PRAVACHOL) 40 MG tablet Take 1 tablet (40 mg total) by mouth every evening.  90 tablet  3  . vitamin C (ASCORBIC ACID) 500 MG tablet Take 500 mg by mouth at bedtime.       No current facility-administered medications for this visit.     Past Medical History  Diagnosis Date  . Hypertension   . Hyperlipidemia   . Anemia   . CAD (coronary artery disease)     a. Nonobstructive CAD 2007 (EF 75%.  RCA  proximal 75% tubular, 25% prox LAD, 25% d1, 50% D2) at [facility]. b. NSTEMI 01/2010 in setting of respiratory failure, medical approach (not a good candidate for noninvasive eval)  . OSA on CPAP   . Hypothyroidism   . Asthma 06/13/2011  . Depression 06/13/2011  . Neuropathy 06/13/2011  . Cervical radiculopathy 06/13/2011  . Gout 06/13/2011  .  Obesity hypoventilation syndrome 06/13/2011  . COPD (chronic obstructive pulmonary disease)     on O2  . Breast cancer dx'd 2005    left  . Cellulitis     hx of cellulitis in left leg  . Impaired glucose tolerance 11/01/2012  . PFO (patent foramen ovale)   . Atrial flutter   . Atrial fibrillation   . Syncope and collapse     Bradycardia with pauses. S/P pacemaker  . Risk for falls     Past Surgical History  Procedure Laterality Date  . Ovarian cyst removal    . Breast lumpectomy Left   . Cervical disc surgery      have cadaver bones,, screws, and plates   . Cervical biopsy  June 2013    at [facility] for Heavy  Bleeding  . Dilation and curettage of uterus    . Colonoscopy    . Laparoscopic appendectomy  03/29/2012    Procedure: APPENDECTOMY LAPAROSCOPIC;  Surgeon: Adin Hector, MD;  Location: [facility];  Service: General;   Laterality: N/A;  . Tee without cardioversion N/A 03/15/2013    Procedure: TRANSESOPHAGEAL ECHOCARDIOGRAM (TEE);  Surgeon: Thayer Headings, MD;  Location: [facility];  Service: Cardiovascular;  Laterality: N/A;  . Cardioversion N/A 03/15/2013    Procedure: CARDIOVERSION;  Surgeon: Thayer Headings, MD;  [facility] END[facility]COPY;  Service: Cardiovascular;  Laterality: N/A;  . Loop recorder implant  08-28-2013; 08-31-2013    MDT LinQ implanted by Dr Rayann Heman for syncope; explanted 08-31-2013 after sinus pauses identified  . Pacemaker insertion  08-31-2013    Biotronik Evera dual chamber pacemaker placed for neurocardiogenic syncope and sinus pauses by Dr Rayann Heman    History   Social History  . Marital Status: Divorced    Spouse Name: N/A    Number of Children: 2  . Years of Education: N/A   Occupational History  . Retired    Social History Main Topics  . Smoking status: Former Smoker -- 1.00 packs/day for 20 years    Types: Cigarettes    Quit date: 09/20/1998  . Smokeless tobacco: Never Used     Comment: 1ppd x 20 years  . Alcohol Use: 0.6 oz/week    1 Glasses of wine per week     Comment: rare  . Drug Use: No  . Sexual Activity: Not Currently    Birth Control/ Protection: Post-menopausal   Other Topics Concern  . Not on file   Social History Narrative   Lives alone in an apartment.    ROS: Diarrhea And occasional vaginal bleeding but no fevers or chills, productive cough, hemoptysis, dysphasia, odynophagia, melena, dysuria, hematuria, rash, seizure activity, orthopnea, PND, pedal edema, claudication. Remaining systems are negative.  Physical Exam: Well-developed morbidly obese in no acute distress.  Skin is warm and dry.  HEENT is normal.  Neck is supple.  Chest is clear to auscultation with normal expansion.  Cardiovascular exam is regular rate and rhythm.  Abdominal exam nontender or distended. No masses palpated. Extremities show no edema. neuro grossly intact  ECG  Sinus rhythm at a rate of 99. Nonspecific ST changes.

## 2014-06-05 ENCOUNTER — Telehealth: Payer: Self-pay

## 2014-06-05 MED ORDER — METRONIDAZOLE 500 MG PO TABS: 500.0000 mg | ORAL_TABLET | Freq: Three times a day (TID) | ORAL | Status: DC

## 2014-06-05 NOTE — Addendum Note (Signed)
Addended by: Vennie Homans on: 06/05/2014 12:14 PM   Modules accepted: Orders

## 2014-06-05 NOTE — Telephone Encounter (Signed)
I will forward to Dr Hilarie Fredrickson to see if he is the treating MD for this, I suspect sent to me in error

## 2014-06-05 NOTE — Telephone Encounter (Signed)
I believe this was likely ordered per GI - Dr Freddy Jaksch to contact pt - has she been treated?

## 2014-06-05 NOTE — Telephone Encounter (Signed)
Pt aware and script sent to pharmacy. Colon cancelled. Reminder in epic for [place].

## 2014-06-05 NOTE — Telephone Encounter (Signed)
[facility] called with Critical C-diff detected.

## 2014-06-05 NOTE — Telephone Encounter (Signed)
Called the patient and she has not been treated or heard results either.  Advise as she is somewhat confused. Advise please

## 2014-06-05 NOTE — Telephone Encounter (Signed)
Thanks for the note, C. difficile positive Please treat with metronidazole 500 mg 3 times daily x14 days Patient should complete therapy entirely and let us know if there is a problem Cancel colonoscopy for now given active C. Difficile Return to office in 4 weeks to see me or Alonza Bogus, PA-C for followup

## 2014-06-05 NOTE — Telephone Encounter (Signed)
Called left a message on both cell and home number to call back

## 2014-06-07 LAB — STOOL CULTURE

## 2014-06-17 ENCOUNTER — Encounter (HOSPITAL_COMMUNITY): Admission: RE | Payer: Self-pay | Source: Ambulatory Visit

## 2014-06-17 ENCOUNTER — Ambulatory Visit (HOSPITAL_COMMUNITY): Admission: RE | Admit: 2014-06-17 | Payer: Medicare Other | Source: Ambulatory Visit | Admitting: Internal Medicine

## 2014-06-17 SURGERY — COLONOSCOPY
Anesthesia: Moderate Sedation

## 2014-06-20 ENCOUNTER — Other Ambulatory Visit: Payer: Self-pay | Admitting: Internal Medicine

## 2014-06-26 ENCOUNTER — Telehealth: Payer: Self-pay | Admitting: Hematology

## 2014-06-26 ENCOUNTER — Other Ambulatory Visit (HOSPITAL_BASED_OUTPATIENT_CLINIC_OR_DEPARTMENT_OTHER): Payer: Medicare Other

## 2014-06-26 ENCOUNTER — Encounter: Payer: Self-pay | Admitting: Hematology

## 2014-06-26 ENCOUNTER — Other Ambulatory Visit: Payer: Self-pay | Admitting: Hematology

## 2014-06-26 ENCOUNTER — Ambulatory Visit (HOSPITAL_BASED_OUTPATIENT_CLINIC_OR_DEPARTMENT_OTHER): Payer: Medicare Other | Admitting: Hematology

## 2014-06-26 ENCOUNTER — Telehealth: Payer: Self-pay | Admitting: Gastroenterology

## 2014-06-26 VITALS — BP 126/49 | HR 94 | Temp 97.4°F | Resp 18 | Ht 64.0 in | Wt 350.6 lb

## 2014-06-26 DIAGNOSIS — Z853 Personal history of malignant neoplasm of breast: Secondary | ICD-10-CM | POA: Diagnosis not present

## 2014-06-26 DIAGNOSIS — C50912 Malignant neoplasm of unspecified site of left female breast: Secondary | ICD-10-CM

## 2014-06-26 DIAGNOSIS — C50919 Malignant neoplasm of unspecified site of unspecified female breast: Secondary | ICD-10-CM

## 2014-06-26 DIAGNOSIS — I4891 Unspecified atrial fibrillation: Secondary | ICD-10-CM

## 2014-06-26 DIAGNOSIS — N950 Postmenopausal bleeding: Secondary | ICD-10-CM | POA: Diagnosis not present

## 2014-06-26 DIAGNOSIS — Z1231 Encounter for screening mammogram for malignant neoplasm of breast: Secondary | ICD-10-CM

## 2014-06-26 LAB — COMPREHENSIVE METABOLIC PANEL (CC13)
ALBUMIN: 3.4 g/dL — AB (ref 3.5–5.0)
ALT: 35 U/L (ref 0–55)
ANION GAP: 9 meq/L (ref 3–11)
AST: 39 U/L — ABNORMAL HIGH (ref 5–34)
Alkaline Phosphatase: 93 U/L (ref 40–150)
BUN: 5.8 mg/dL — ABNORMAL LOW (ref 7.0–26.0)
CO2: 31 mEq/L — ABNORMAL HIGH (ref 22–29)
Calcium: 9.8 mg/dL (ref 8.4–10.4)
Chloride: 102 mEq/L (ref 98–109)
Creatinine: 0.8 mg/dL (ref 0.6–1.1)
GLUCOSE: 106 mg/dL (ref 70–140)
POTASSIUM: 3.9 meq/L (ref 3.5–5.1)
SODIUM: 142 meq/L (ref 136–145)
TOTAL PROTEIN: 7.2 g/dL (ref 6.4–8.3)
Total Bilirubin: 0.39 mg/dL (ref 0.20–1.20)

## 2014-06-26 LAB — CBC WITH DIFFERENTIAL/PLATELET
BASO%: 0.7 % (ref 0.0–2.0)
BASOS ABS: 0 10*3/uL (ref 0.0–0.1)
EOS%: 3.4 % (ref 0.0–7.0)
Eosinophils Absolute: 0.2 10*3/uL (ref 0.0–0.5)
HCT: 41 % (ref 34.8–46.6)
HEMOGLOBIN: 12.9 g/dL (ref 11.6–15.9)
LYMPH%: 36.3 % (ref 14.0–49.7)
MCH: 29.4 pg (ref 25.1–34.0)
MCHC: 31.6 g/dL (ref 31.5–36.0)
MCV: 93 fL (ref 79.5–101.0)
MONO#: 0.5 10*3/uL (ref 0.1–0.9)
MONO%: 7.9 % (ref 0.0–14.0)
NEUT#: 3.1 10*3/uL (ref 1.5–6.5)
NEUT%: 51.7 % (ref 38.4–76.8)
Platelets: 171 10*3/uL (ref 145–400)
RBC: 4.4 10*6/uL (ref 3.70–5.45)
RDW: 14.5 % (ref 11.2–14.5)
WBC: 6 10*3/uL (ref 3.9–10.3)
lymph#: 2.2 10*3/uL (ref 0.9–3.3)

## 2014-06-26 NOTE — Telephone Encounter (Signed)
Spoke with patient and told her about OV she has scheduled with Alonza Bogus, PA tomorrow at 1:30 pm.

## 2014-06-26 NOTE — Telephone Encounter (Signed)
gv and printed appt sched and av sfo rpt for OCT and NOV.Marland KitchenMarland KitchenMarland Kitchen

## 2014-06-27 ENCOUNTER — Ambulatory Visit (INDEPENDENT_AMBULATORY_CARE_PROVIDER_SITE_OTHER): Payer: Medicare Other | Admitting: Gastroenterology

## 2014-06-27 ENCOUNTER — Encounter: Payer: Self-pay | Admitting: Gastroenterology

## 2014-06-27 VITALS — BP 110/60 | HR 72 | Ht 64.0 in | Wt 345.0 lb

## 2014-06-27 DIAGNOSIS — Z8719 Personal history of other diseases of the digestive system: Secondary | ICD-10-CM | POA: Diagnosis not present

## 2014-06-27 DIAGNOSIS — R197 Diarrhea, unspecified: Secondary | ICD-10-CM

## 2014-06-27 DIAGNOSIS — I2510 Atherosclerotic heart disease of native coronary artery without angina pectoris: Secondary | ICD-10-CM

## 2014-06-27 DIAGNOSIS — IMO0001 Reserved for inherently not codable concepts without codable children: Secondary | ICD-10-CM

## 2014-06-27 NOTE — Progress Notes (Addendum)
     06/27/2014 [name] [name] [id number] December 20, 1945   History of Present Illness:  This is a 68 year old female who was recently seen for diarrhea.  She was found to have Cdiff and was treated with flagyl.  She just completed that antibiotic 6 days ago.  She says that her stools were not back to normal but were starting to firm up, but then yesterday she started having diarrhea again.  She had 3 BM's yesterday, but so far none today because she has not eaten since she had to come out for this appointment and did not want to have to worry about having diarrhea.  Current Medications, Allergies, Past Medical History, Past Surgical History, Family History and Social History were reviewed in [employer] record.   Physical Exam: BP 110/60  Pulse 72  Ht 5' 4"  (1.626 m)  Wt 345 lb (156.491 kg)  BMI 59.19 kg/m2 General: Well developed black female in no acute distress Head: Normocephalic and atraumatic Eyes:  Sclerae anicteric, conjunctiva pink  Ears: Normal auditory acuity Lungs: Clear throughout to auscultation Heart: Regular rate and rhythm Abdomen: Soft, obese, non-distended.  Normal bowel sounds.  Non-tender. Musculoskeletal: Symmetrical with no gross deformities  Extremities: No edema  Neurological: Alert oriented x 4, grossly non-focal Psychological:  Alert and cooperative. Normal mood and affect  Assessment and Recommendations: *Diarrhea:  Recently had Cdiff and just finished treatment 6 days ago.  Had recurrent diarrhea starting yesterday again.  If she still has Cdiff then will need re-treated with Vancomycin.  If negative then will re-discuss colonoscopy vs Cologuard for history of colon polyps/surveillance.  Addendum: Reviewed and agree with initial management. Repeat C. differential PCR ordered and still pending. Need this result to make treatment decisions. Jerene Bears, MD

## 2014-06-27 NOTE — Patient Instructions (Signed)
Your physician has requested that you go to the basement for the following lab work before leaving today: C-diff

## 2014-06-29 ENCOUNTER — Encounter: Payer: Self-pay | Admitting: Hematology

## 2014-06-29 NOTE — Progress Notes (Signed)
[place] OFFICE PROGRESS NOTE 06/26/14  [name], MD [place] [address]  DIAGNOSIS: Breast cancer, left - Plan: CBC with Differential, Comprehensive metabolic panel (Cmet) - [facility], MM Digital Diagnostic Bilat  Chief Complaint  Patient presents with  . Follow-up    CURRENT THERAPY: Surveillance  INTERVAL HISTORY:  [name] [name] 68 y.o. female with history of infiltrating ductal carcinoma of the left breast (August 2006) who received all of her care for her breast cancer at [facility] is here for followup.  She was last seen on 12/26/2013 by Dr Juliann Mule.  Her breast cancer history is as noted below.    Today, overall, she reports doing pretty good.  She reports continued neuropathy of her hands and feet since receipt of her chemotherapy.  She denies any fevers or chills.  She reports resolution of her prior left breast cellulitis.  She was last seen by her primary care physician Dr Jenny Reichmann on 11/07/2013.  She reports resolution of her postmenopausal vaginal bleeding.  She is off the megace now.  She does report having a pacemaker implanted in December 0086 without complication.  She saw her Gynecologist Dr. Roselie Awkward on 01/04/2014. She does report having a severe skin reaction to Keflex for treatment of a urinary tract infection about 6 weeks ago.  Her main goal is to get back to water aerobics and exercise to help her lose weight, she reports.   She has been on Oxygen for few years. Also recently recovered from a C Diff infection. Patient was BRCA negative. She does have to delay colonoscopy for that reason.Her Mammogram 05/23/2014 was negative.she has a family history of Breast cancer.  MEDICAL HISTORY: Past Medical History  Diagnosis Date  . Hypertension   . Hyperlipidemia   . Anemia   . CAD (coronary artery disease)     a. Nonobstructive CAD 2007 (EF 75%.  RCA  proximal 75% tubular, 25% prox LAD, 25% d1, 50% D2) at [facility]. b. NSTEMI 01/2010 in setting of  respiratory failure, medical approach (not a good candidate for noninvasive eval)  . OSA on CPAP   . Hypothyroidism   . Asthma 06/13/2011  . Depression 06/13/2011  . Neuropathy 06/13/2011  . Cervical radiculopathy 06/13/2011  . Gout 06/13/2011  . Obesity hypoventilation syndrome 06/13/2011  . COPD (chronic obstructive pulmonary disease)     on O2  . Breast cancer dx'd 2005    No BP check or stick in Left arm  . Cellulitis     hx of cellulitis in left leg  . Impaired glucose tolerance 11/01/2012  . PFO (patent foramen ovale)   . Atrial flutter   . Atrial fibrillation   . Syncope and collapse     Bradycardia with pauses. S/P pacemaker  . Risk for falls     INTERIM HISTORY: has HYPOTHYROIDISM; HYPERLIPIDEMIA; HYPERTENSION; ACUT MI SUBENDOCARDIAL INFARCT SUBSQT EPIS CARE; CORONARY ARTERY DISEASE; C O P D; OBSTRUCTIVE SLEEP APNEA; OXYGEN-USE OF SUPPLEMENTAL; Preventative health care; Breast cancer; Asthma; Depression; Colon polyps; Neuropathy; Cervical radiculopathy; Gout; Obesity hypoventilation syndrome; Sick sinus syndrome; Morbid obesity with BMI of 50.0-59.9, adult; Acute appendicitis; Cough; Abnormal TSH; Right lumbar radiculopathy; Impaired glucose tolerance; Abnormal liver function tests; Atrial flutter; Tachycardia; Anticoagulant long-term use; Tachy-brady syndrome; Acute diastolic CHF (congestive heart failure); PFO (patent foramen ovale); Hypokalemia; Postmenopausal vaginal bleeding; Syncope; Atrial fibrillation; Syncope and collapse; Urinary urgency; Post-menopausal bleeding; Exfoliative dermatitis; Pacemaker; Diarrhea; Abdominal pain, other specified site; RUQ pain; Lump in  the abdomen; and History of Clostridium difficile on her problem list.    ALLERGIES:  is allergic to cephalexin; codeine; lisinopril; and penicillins.  MEDICATIONS: has a current medication list which includes the following prescription(s): allopurinol, amitriptyline, vitamin d, duloxetine, eliquis, ferrous sulfate,  furosemide, levothyroxine, metoprolol tartrate, omega-3 fatty acids, oxygen-helium, potassium chloride sa, pravastatin, and vitamin c.  SURGICAL HISTORY:  Past Surgical History  Procedure Laterality Date  . Ovarian cyst removal    . Breast lumpectomy Left   . Cervical disc surgery      have cadaver bones,, screws, and plates   . Cervical biopsy  June 2013    at [facility] for Heavy  Bleeding  . Dilation and curettage of uterus    . Colonoscopy    . Laparoscopic appendectomy  03/29/2012    Procedure: APPENDECTOMY LAPAROSCOPIC;  Surgeon: Adin Hector, MD;  Location: [facility];  Service: General;  Laterality: N/A;  . Tee without cardioversion N/A 03/15/2013    Procedure: TRANSESOPHAGEAL ECHOCARDIOGRAM (TEE);  Surgeon: Thayer Headings, MD;  Location: [facility];  Service: Cardiovascular;  Laterality: N/A;  . Cardioversion N/A 03/15/2013    Procedure: CARDIOVERSION;  Surgeon: Thayer Headings, MD;  Location: [facility] ENDOSCOPY;  Service: Cardiovascular;  Laterality: N/A;  . Loop recorder implant  08-28-2013; 08-31-2013    MDT LinQ implanted by Dr Rayann Heman for syncope; explanted 08-31-2013 after sinus pauses identified  . Pacemaker insertion  08-31-2013    Biotronik Evera dual chamber pacemaker placed for neurocardiogenic syncope and sinus pauses by Dr Rayann Heman    REVIEW OF SYSTEMS:   Constitutional: Denies fevers, chills or abnormal weight loss Eyes: Denies blurriness of vision Ears, nose, mouth, throat, and face: Denies mucositis or sore throat Respiratory: Denies cough, dyspnea or wheezes Cardiovascular: Denies palpitation, chest discomfort or lower extremity swelling Gastrointestinal:  Denies nausea, heartburn or change in bowel habits Skin: Denies abnormal skin rashes Lymphatics: Denies new lymphadenopathy or easy bruising Neurological:She reports numbness, tingling of her hands and feets but denies new weaknesses Behavioral/Psych: Mood is stable, no new changes  All other systems were reviewed with  the patient and are negative.  PHYSICAL EXAMINATION: ECOG PERFORMANCE STATUS: 0-1  Blood pressure 126/49, pulse 94, temperature 97.4 F (36.3 C), temperature source Oral, resp. rate 18, height 5' 4"  (1.626 m), weight 350 lb 9.6 oz (159.031 kg), SpO2 98.00%.  GENERAL:alert, no distress and comfortable; Morbidly obese. SKIN: skin color, texture, turgor are normal, no rashes or significant lesions EYES: normal, Conjunctiva are pink and non-injected, sclera clear OROPHARYNX:no exudate, no erythema and lips, buccal mucosa, and tongue normal  NECK: supple, thyroid normal size, non-tender, without nodularity LYMPH:  no palpable lymphadenopathy in the cervical, axillary or supraclavicular LUNGS: clear to auscultation and percussion with normal breathing effort HEART: regular rate & rhythm and no murmurs and no chronic pitting extremity edema BREASTS: Large, right breast is benign, and pendulous.  Left breast shows the effects of radiation with skin changes but no suspicious finding suggestive of recurrence.  No axillary adenopathy.  ABDOMEN:abdomen soft, non-tender and normal bowel sounds Musculoskeletal:no cyanosis of digits and no clubbing  NEURO: alert & oriented x 3 with fluent speech, no focal motor/sensory deficits  Labs:  Lab Results  Component Value Date   WBC 6.0 06/26/2014   HGB 12.9 06/26/2014   HCT 41.0 06/26/2014   MCV 93.0 06/26/2014   PLT 171 06/26/2014   NEUTROABS 3.1 06/26/2014      Chemistry      Component Value Date/Time  NA 142 06/26/2014 0819   NA 141 05/31/2014 1032   K 3.9 06/26/2014 0819   K 4.1 05/31/2014 1032   CL 101 05/31/2014 1032   CL 97* 12/28/2012 0930   CO2 31* 06/26/2014 0819   CO2 30 05/31/2014 1032   BUN 5.8* 06/26/2014 0819   BUN 12 05/31/2014 1032   CREATININE 0.8 06/26/2014 0819   CREATININE 1.0 05/31/2014 1032      Component Value Date/Time   CALCIUM 9.8 06/26/2014 0819   CALCIUM 9.7 05/31/2014 1032   ALKPHOS 93 06/26/2014 0819   ALKPHOS 104 05/31/2014  1032   AST 39* 06/26/2014 0819   AST 50* 05/31/2014 1032   ALT 35 06/26/2014 0819   ALT 39* 05/31/2014 1032   BILITOT 0.39 06/26/2014 0819   BILITOT 0.5 05/31/2014 1032     RADIOGRAPHIC STUDIES: CXR 12/26/2013 CLINICAL DATA: History of breast cancer, hypoventilation syndrome  and abnormal liver function tests. Chronic shortness of breath.  EXAM: CHEST 2 VIEW COMPARISON: DG CHEST 2 VIEW dated 09/01/2013; CT ANGIO CHEST W/CM &/OR WO/CM dated 08/28/2013 FINDINGS: Trachea is midline. Heart is mildly enlarged and stable. Left subclavian pacemaker lead tips are seen in the right atrium and right ventricle. Lungs are hyperinflated but clear. No pleural fluid. IMPRESSION: Hyperinflation without acute finding.   Mm Digital Screening 05/23/2014 BI-RADS 2 EXAM.    PATHOLOGY: Diagnosis Endometrium, biopsy - DEGENERATING SECRETORY-TYPE ENDOMETRIUM. - THERE IS NO EVIDENCE OF HYPERPLASIA OR MALIGNANCY. - SEE COMMENT. Microscopic Comment This pattern can be associated with menses, irregular shedding or breakthrough bleeding associated with hormonal therapy. Clinical correlation is suggested. (JBK:caf 05/14/13) Enid Cutter MD Pathologist, Electronic Signature (Case signed 05/14/2013)  ASSESSMENT: Rich Number 68 y.o. female with a history of Breast cancer, left - Plan: CBC with Differential, Comprehensive metabolic panel (Cmet) - [facility], MM Digital Diagnostic Bilat  PLAN:  1. T2N0, stage IIA triple negative infiltrating ductal carcinoma of the left breast (2006).  --She is s/p AC x 4 and then weekly Taxotere completed on April, 2007.  She has received radiation treatments between April and June of 2007.   --She is now over 8 years from the time of diagnosis without evidence of disease.  Patient was initially offered to return every 3 months, but has declined and wished to return on a 6 month basis.  She will continue screening mammograms.   Her chest xray is as noted above.   2. Postmenopausal vaginal  bleeding. --She has had one biopsy which has been negative for malignancy.  She will continue close follow up with her Ob/Gyn on 04/17.  Her hemoglobin is stable.   She reports that she was not a candidate for hysterectomy.   3. Afib  --She will continue her apixaban 5 mg bid.  She was counseled extensively on symptoms of anemia and worsening bleeding in the setting of #2. She understands and agrees to call her Ob/Gyn for repeat testing if symptoms persist.   All questions were answered. The patient knows to call the clinic with any problems, questions or concerns. We can certainly see the patient much sooner if necessary.  The patient was provided an after visit summary.   RTC in 1 year after labs and mammogram.  I spent 15 minutes counseling the patient face to face. The total time spent in the appointment was 25 minutes.    Bernadene Bell, MD Medical Hematologist/Oncologist [place] Pager: [phone] Office No: [phone]

## 2014-07-03 ENCOUNTER — Other Ambulatory Visit: Payer: Self-pay | Admitting: Cardiology

## 2014-07-08 ENCOUNTER — Other Ambulatory Visit: Payer: Medicare Other

## 2014-07-08 DIAGNOSIS — IMO0001 Reserved for inherently not codable concepts without codable children: Secondary | ICD-10-CM

## 2014-07-08 DIAGNOSIS — R197 Diarrhea, unspecified: Secondary | ICD-10-CM

## 2014-07-09 LAB — CLOSTRIDIUM DIFFICILE BY PCR

## 2014-07-10 ENCOUNTER — Telehealth: Payer: Self-pay | Admitting: *Deleted

## 2014-07-10 NOTE — Telephone Encounter (Signed)
Left a message for patient to call back. Scheduled OV with Dr. Hilarie Fredrickson on 09/10/14 at 3:00 PM.

## 2014-07-10 NOTE — Telephone Encounter (Signed)
Notes Recorded by Laban Emperor. Zehr, PA-C on 07/10/2014 at 2:33 PM Glad she is feeling better. Since the diarrhea is no longer a problem then let's schedule her to come in and meet with Dr. Hilarie Fredrickson to re-discuss the issue of colonoscopy vs Cologuard study for her history of polyps. First available is fine.  Thank you,  Jess

## 2014-07-10 NOTE — Telephone Encounter (Signed)
Spoke with patient and gave her recommendations and appointment date.

## 2014-07-14 ENCOUNTER — Other Ambulatory Visit: Payer: Self-pay | Admitting: Cardiology

## 2014-07-28 ENCOUNTER — Other Ambulatory Visit: Payer: Self-pay | Admitting: Internal Medicine

## 2014-08-15 ENCOUNTER — Encounter: Payer: Self-pay | Admitting: Internal Medicine

## 2014-08-20 ENCOUNTER — Encounter: Payer: Self-pay | Admitting: Internal Medicine

## 2014-08-29 ENCOUNTER — Encounter (HOSPITAL_COMMUNITY): Payer: Self-pay | Admitting: Internal Medicine

## 2014-09-02 ENCOUNTER — Encounter: Payer: Self-pay | Admitting: Gastroenterology

## 2014-09-03 ENCOUNTER — Other Ambulatory Visit: Payer: Self-pay | Admitting: *Deleted

## 2014-09-03 ENCOUNTER — Telehealth: Payer: Self-pay | Admitting: *Deleted

## 2014-09-03 DIAGNOSIS — R197 Diarrhea, unspecified: Secondary | ICD-10-CM

## 2014-09-03 NOTE — Telephone Encounter (Signed)
Spoke with patient and she will come tomorrow for stool study.  She will keep her OV.

## 2014-09-03 NOTE — Telephone Encounter (Signed)
Check stool for Cdiff. We will call her with results but she needs to keep her appt on 12/22 with Dr. Hilarie Fredrickson.        Thank you,        Jess   Lab in [facility]. Left a message for patient to call back.

## 2014-09-03 NOTE — Telephone Encounter (Signed)
Left a message for patient to call back. 

## 2014-09-05 ENCOUNTER — Other Ambulatory Visit: Payer: Medicare Other

## 2014-09-05 DIAGNOSIS — R197 Diarrhea, unspecified: Secondary | ICD-10-CM

## 2014-09-06 LAB — CLOSTRIDIUM DIFFICILE BY PCR: Toxigenic C. Difficile by PCR: NOT DETECTED

## 2014-09-10 ENCOUNTER — Ambulatory Visit (INDEPENDENT_AMBULATORY_CARE_PROVIDER_SITE_OTHER): Payer: Medicare Other | Admitting: Internal Medicine

## 2014-09-10 ENCOUNTER — Encounter: Payer: Self-pay | Admitting: Internal Medicine

## 2014-09-10 VITALS — BP 110/68 | HR 97 | Ht 65.5 in | Wt 349.0 lb

## 2014-09-10 DIAGNOSIS — I2510 Atherosclerotic heart disease of native coronary artery without angina pectoris: Secondary | ICD-10-CM

## 2014-09-10 DIAGNOSIS — G4733 Obstructive sleep apnea (adult) (pediatric): Secondary | ICD-10-CM

## 2014-09-10 DIAGNOSIS — Z86010 Personal history of colonic polyps: Secondary | ICD-10-CM | POA: Diagnosis not present

## 2014-09-10 DIAGNOSIS — J449 Chronic obstructive pulmonary disease, unspecified: Secondary | ICD-10-CM | POA: Diagnosis not present

## 2014-09-10 DIAGNOSIS — Z7901 Long term (current) use of anticoagulants: Secondary | ICD-10-CM

## 2014-09-10 DIAGNOSIS — IMO0001 Reserved for inherently not codable concepts without codable children: Secondary | ICD-10-CM

## 2014-09-10 DIAGNOSIS — Z8719 Personal history of other diseases of the digestive system: Secondary | ICD-10-CM

## 2014-09-10 MED ORDER — PEG-KCL-NACL-NASULF-NA ASC-C 100 G PO SOLR: 1.0000 | Freq: Once | ORAL | Status: DC

## 2014-09-10 NOTE — Patient Instructions (Addendum)
You have been scheduled for a colonoscopy on 10/22/2014 at [facility]  Separate instructions has been given Use Florastor over the counter 238m twice a day  OK to hold Eliquis per Dr TLovena Lefor 48 hours before procedure  (9-15 note telephone note)

## 2014-09-11 ENCOUNTER — Telehealth: Payer: Self-pay | Admitting: Cardiology

## 2014-09-11 NOTE — Telephone Encounter (Signed)
Close encounter 

## 2014-09-11 NOTE — Progress Notes (Signed)
Patient ID: [name] [name], female   DOB: [birth year], 68 y.o.   MRN: [id number] HPI: Sarye Kath is a 68 yo female with PMH of C. difficile colitis status post treatment, history of colon polyps, CAD, COPD, OSA, obesity, breast cancer in remission, atrial fibrillation on Eliquis who is seen in follow-up. She was seen on 06/27/2014 by Alonza Bogus, PA-C at which point she was having recurrent diarrhea after Flagyl for C. difficile. Vancomycin was given and she returns today for follow-up. She is requesting colonoscopy for colon polyp surveillance. She reports her diarrhea is much better though her stools or not completely back to normal for her. There no longer watery can be "mushy". She is able to control them and is not having incontinence. She denies abdominal pain. Reports good appetite. Denies nausea or vomiting. Denies hepatobiliary complaint. She is having some vaginal bleeding which is been worked up with ultrasound and biopsy. They had not been able to diagnose any specific pathology other than thickened uterine lining. No evidence of malignancy. She reports she had a colonoscopy performed 6 or 7 years ago at [facility] which required polypectomy. She was advised to follow-up in 5 years and she is interested in repeat colonoscopy at this time.  She is on oxygen, primarily with exertion though she is advised to wear it 24/7.  Past Medical History  Diagnosis Date  . Hypertension   . Hyperlipidemia   . Anemia   . CAD (coronary artery disease)     a. Nonobstructive CAD 2007 (EF 75%.  RCA  proximal 75% tubular, 25% prox LAD, 25% d1, 50% D2) at [facility]. b. NSTEMI 01/2010 in setting of respiratory failure, medical approach (not a good candidate for noninvasive eval)  . OSA on CPAP   . Hypothyroidism   . Asthma 06/13/2011  . Depression 06/13/2011  . Neuropathy 06/13/2011  . Cervical radiculopathy 06/13/2011  . Gout 06/13/2011  . Obesity hypoventilation syndrome 06/13/2011  . COPD (chronic obstructive pulmonary  disease)     on O2  . Breast cancer dx'd 2005    No BP check or stick in Left arm  . Cellulitis     hx of cellulitis in left leg  . Impaired glucose tolerance 11/01/2012  . PFO (patent foramen ovale)   . Atrial flutter   . Atrial fibrillation   . Syncope and collapse     Bradycardia with pauses. S/P pacemaker  . Risk for falls     Past Surgical History  Procedure Laterality Date  . Ovarian cyst removal    . Breast lumpectomy Left   . Cervical disc surgery      have cadaver bones,, screws, and plates   . Cervical biopsy  June 2013    at [facility] for Heavy  Bleeding  . Dilation and curettage of uterus    . Colonoscopy    . Laparoscopic appendectomy  03/29/2012    Procedure: APPENDECTOMY LAPAROSCOPIC;  Surgeon: Adin Hector, MD;  Location: [facility];  Service: General;  Laterality: N/A;  . Tee without cardioversion N/A 03/15/2013    Procedure: TRANSESOPHAGEAL ECHOCARDIOGRAM (TEE);  Surgeon: Thayer Headings, MD;  Location: [place];  Service: Cardiovascular;  Laterality: N/A;  . Cardioversion N/A 03/15/2013    Procedure: CARDIOVERSION;  Surgeon: Thayer Headings, MD;  Location: [facility] ENDOSCOPY;  Service: Cardiovascular;  Laterality: N/A;  . Loop recorder implant  08-28-2013; 08-31-2013    MDT LinQ implanted by Dr Rayann Heman for syncope; explanted 08-31-2013 after sinus pauses identified  .  Pacemaker insertion  08-31-2013    Biotronik Evera dual chamber pacemaker placed for neurocardiogenic syncope and sinus pauses by Dr Rayann Heman  . Loop recorder implant N/A 08/28/2013    Procedure: LOOP RECORDER IMPLANT;  Surgeon: Coralyn Mark, MD;  Location: [place] CATH LAB;  Service: Cardiovascular;  Laterality: N/A;  . Permanent pacemaker insertion N/A 08/31/2013    Procedure: PERMANENT PACEMAKER INSERTION;  Surgeon: Coralyn Mark, MD;  Location: [place] CATH LAB;  Service: Cardiovascular;  Laterality: N/A;  . Loop recorder explant  08/31/2013    Procedure: LOOP RECORDER EXPLANT;  Surgeon: Coralyn Mark, MD;   Location: [place] CATH LAB;  Service: Cardiovascular;;    Outpatient Prescriptions Prior to Visit  Medication Sig Dispense Refill  . allopurinol (ZYLOPRIM) 300 MG tablet TAKE 1 TABLET DAILY 90 tablet 3  . amitriptyline (ELAVIL) 100 MG tablet TAKE 1 TABLET AT BEDTIME 90 tablet 1  . Cholecalciferol (VITAMIN D) 2000 UNITS tablet Take 2,000 Units by mouth at bedtime.    . DULoxetine (CYMBALTA) 30 MG capsule Take 60 mg by mouth at bedtime.     . DULoxetine (CYMBALTA) 30 MG capsule TAKE 1 CAPSULE TWICE A DAY 180 capsule 2  . ELIQUIS 5 MG TABS tablet TAKE 1 TABLET TWICE A DAY 180 tablet 2  . ferrous sulfate 325 (65 FE) MG tablet Take 325 mg by mouth daily with breakfast.    . furosemide (LASIX) 40 MG tablet TAKE 1 TABLET EVERY MORNING AND TAKE 1 TABLET EVERY OTHER NIGHT AS DIRECTED 90 tablet 3  . levothyroxine (SYNTHROID, LEVOTHROID) 200 MCG tablet TAKE 1 TABLET DAILY 90 tablet 2  . metoprolol tartrate (LOPRESSOR) 25 MG tablet Take 1 tablet (25 mg total) by mouth 2 (two) times daily. 180 tablet 3  . Omega-3 Fatty Acids (FISH OIL PO) Take 1 capsule by mouth daily.     . OXYGEN Inhale into the lungs. Continuous O2 @ 2LMP    . potassium chloride SA (K-DUR,KLOR-CON) 20 MEQ tablet TAKE 1 TABLET THREE TIMES A DAY 270 tablet 2  . pravastatin (PRAVACHOL) 40 MG tablet TAKE 1 TABLET EVERY EVENING 90 tablet 0  . vitamin C (ASCORBIC ACID) 500 MG tablet Take 500 mg by mouth at bedtime.     No facility-administered medications prior to visit.    Allergies  Allergen Reactions  . Cephalexin Other (See Comments)    Exfoliative dermatitis reaction  . Codeine     REACTION: unknown - pt. states unaware of having reaction to codeine  . Lisinopril     REACTION: cough  . Penicillins     rash    Family History  Problem Relation Age of Onset  . Uterine cancer Mother   . Heart disease Mother   . Breast cancer Sister 30  . Ovarian cancer Sister   . Diabetes Brother   . Breast cancer Maternal Aunt   . Breast  cancer Cousin 46  . Ovarian cancer Sister     History  Substance Use Topics  . Smoking status: Former Smoker -- 1.00 packs/day for 20 years    Types: Cigarettes    Quit date: 09/20/1998  . Smokeless tobacco: Never Used     Comment: 1ppd x 20 years  . Alcohol Use: 0.6 oz/week    1 Glasses of wine per week     Comment: rare    ROS: As per history of present illness, otherwise negative  BP 110/68 mmHg  Pulse 97  Ht 5' 5.5" (1.664 m)  [employer] [facility]  349 lb (158.305 kg)  BMI 57.17 kg/m2  SpO2 98% Constitutional: Well-developed and well-nourished. No distress. HEENT: Normocephalic and atraumatic. Oropharynx is clear and moist. No oropharyngeal exudate. Conjunctivae are normal.  No scleral icterus. Nasal cannula in place Neck: Neck supple. Trachea midline. Cardiovascular: Normal rate, regular rhythm and intact distal pulses.  Pulmonary/chest: Effort normal and breath sounds normal. No wheezing, rales or rhonchi. Abdominal: Soft, obese, nontender, nondistended. Bowel sounds active throughout.  Extremities: no clubbing, cyanosis, 1+ lower extremity edema Neurological: Alert and oriented to person place and time. Skin: Skin is warm and dry. No rashes noted. Psychiatric: Normal mood and affect. Behavior is normal.  RELEVANT LABS AND IMAGING: CBC    Component Value Date/Time   WBC 6.0 06/26/2014 0819   WBC 6.7 05/31/2014 1032   RBC 4.40 06/26/2014 0819   RBC 4.72 05/31/2014 1032   HGB 12.9 06/26/2014 0819   HGB 14.1 05/31/2014 1032   HCT 41.0 06/26/2014 0819   HCT 43.5 05/31/2014 1032   PLT 171 06/26/2014 0819   PLT 199.0 05/31/2014 1032   MCV 93.0 06/26/2014 0819   MCV 92.1 05/31/2014 1032   MCH 29.4 06/26/2014 0819   MCH 29.0 08/30/2013 2253   MCHC 31.6 06/26/2014 0819   MCHC 32.4 05/31/2014 1032   RDW 14.5 06/26/2014 0819   RDW 14.7 05/31/2014 1032   LYMPHSABS 2.2 06/26/2014 0819   LYMPHSABS 2.1 05/31/2014 1032   MONOABS 0.5 06/26/2014 0819   MONOABS 0.5 05/31/2014 1032    EOSABS 0.2 06/26/2014 0819   EOSABS 0.2 05/31/2014 1032   BASOSABS 0.0 06/26/2014 0819   BASOSABS 0.0 05/31/2014 1032    CMP     Component Value Date/Time   NA 142 06/26/2014 0819   NA 141 05/31/2014 1032   K 3.9 06/26/2014 0819   K 4.1 05/31/2014 1032   CL 101 05/31/2014 1032   CL 97* 12/28/2012 0930   CO2 31* 06/26/2014 0819   CO2 30 05/31/2014 1032   GLUCOSE 106 06/26/2014 0819   GLUCOSE 106* 05/31/2014 1032   GLUCOSE 111* 12/28/2012 0930   BUN 5.8* 06/26/2014 0819   BUN 12 05/31/2014 1032   CREATININE 0.8 06/26/2014 0819   CREATININE 1.0 05/31/2014 1032   CALCIUM 9.8 06/26/2014 0819   CALCIUM 9.7 05/31/2014 1032   PROT 7.2 06/26/2014 0819   PROT 8.1 05/31/2014 1032   ALBUMIN 3.4* 06/26/2014 0819   ALBUMIN 4.0 05/31/2014 1032   AST 39* 06/26/2014 0819   AST 50* 05/31/2014 1032   ALT 35 06/26/2014 0819   ALT 39* 05/31/2014 1032   ALKPHOS 93 06/26/2014 0819   ALKPHOS 104 05/31/2014 1032   BILITOT 0.39 06/26/2014 0819   BILITOT 0.5 05/31/2014 1032   GFRNONAA 63* 08/30/2013 2253   GFRAA 73* 08/30/2013 2253    ASSESSMENT/PLAN:  67 yo female with PMH of C. difficile colitis status post treatment, history of colon polyps, CAD, COPD, OSA, obesity, breast cancer in remission, atrial fibrillation on Eliquis who is seen in follow-up.  1. Resolved C. Difficile colitis/history of colon polyps -- her diarrhea has resolved. I recommended Florastor 250 mg twice daily to try to completely normalize her bowel habits. I advised that she begin Florastor with any future antibiotic use. She is very much interested in repeat colonoscopy. We discussed the test including the risks and benefits and she is agreeable to proceed. The procedure carries elevated risk compared to baseline given her history of obstructive sleep apnea, option dependent COPD and morbid obesity. She  is also on anticoagulant therapy and this will need to be stopped before colonoscopy. We will seek permission from Dr.  Stanford Breed her cardiologist before proceeding to colonoscopy. The test will need to be performed in the outpatient hospital setting. We discussed discontinuation of polyp surveillance, but she adamantly requests colonoscopy. Do think she understands the risks and benefits completely and wishes to proceed.  2. Chronic anti-coagulation -- managed by Dr. Stanford Breed, will seek permission to hold medicine for colonoscopy  3. COPD/OSA -- stable on oxygen.

## 2014-10-12 ENCOUNTER — Other Ambulatory Visit: Payer: Self-pay | Admitting: Cardiology

## 2014-10-13 NOTE — Telephone Encounter (Signed)
Rx(s) sent to pharmacy electronically.  

## 2014-10-14 ENCOUNTER — Encounter (HOSPITAL_COMMUNITY): Payer: Self-pay | Admitting: *Deleted

## 2014-10-14 ENCOUNTER — Emergency Department (HOSPITAL_COMMUNITY)
Admission: EM | Admit: 2014-10-14 | Discharge: 2014-10-14 | Disposition: A | Discharge status: Home / Self Care | Payer: Medicare Other | Attending: Emergency Medicine | Admitting: Emergency Medicine

## 2014-10-14 ENCOUNTER — Encounter (HOSPITAL_COMMUNITY): Payer: Self-pay | Admitting: Emergency Medicine

## 2014-10-14 DIAGNOSIS — Z8619 Personal history of other infectious and parasitic diseases: Secondary | ICD-10-CM | POA: Insufficient documentation

## 2014-10-14 DIAGNOSIS — Z79899 Other long term (current) drug therapy: Secondary | ICD-10-CM | POA: Diagnosis not present

## 2014-10-14 DIAGNOSIS — I10 Essential (primary) hypertension: Secondary | ICD-10-CM | POA: Insufficient documentation

## 2014-10-14 DIAGNOSIS — Z87891 Personal history of nicotine dependence: Secondary | ICD-10-CM | POA: Insufficient documentation

## 2014-10-14 DIAGNOSIS — Z9981 Dependence on supplemental oxygen: Secondary | ICD-10-CM | POA: Diagnosis not present

## 2014-10-14 DIAGNOSIS — N61 Inflammatory disorders of breast: Secondary | ICD-10-CM | POA: Diagnosis not present

## 2014-10-14 DIAGNOSIS — D649 Anemia, unspecified: Secondary | ICD-10-CM | POA: Insufficient documentation

## 2014-10-14 DIAGNOSIS — N644 Mastodynia: Secondary | ICD-10-CM | POA: Diagnosis present

## 2014-10-14 DIAGNOSIS — I2510 Atherosclerotic heart disease of native coronary artery without angina pectoris: Secondary | ICD-10-CM | POA: Insufficient documentation

## 2014-10-14 DIAGNOSIS — E669 Obesity, unspecified: Secondary | ICD-10-CM | POA: Diagnosis not present

## 2014-10-14 DIAGNOSIS — M109 Gout, unspecified: Secondary | ICD-10-CM | POA: Diagnosis not present

## 2014-10-14 DIAGNOSIS — Z8774 Personal history of (corrected) congenital malformations of heart and circulatory system: Secondary | ICD-10-CM | POA: Diagnosis not present

## 2014-10-14 DIAGNOSIS — Z880 Allergy status to penicillin: Secondary | ICD-10-CM | POA: Diagnosis not present

## 2014-10-14 DIAGNOSIS — Z9889 Other specified postprocedural states: Secondary | ICD-10-CM | POA: Insufficient documentation

## 2014-10-14 DIAGNOSIS — F329 Major depressive disorder, single episode, unspecified: Secondary | ICD-10-CM | POA: Diagnosis not present

## 2014-10-14 DIAGNOSIS — J449 Chronic obstructive pulmonary disease, unspecified: Secondary | ICD-10-CM | POA: Diagnosis not present

## 2014-10-14 DIAGNOSIS — Z853 Personal history of malignant neoplasm of breast: Secondary | ICD-10-CM | POA: Diagnosis not present

## 2014-10-14 DIAGNOSIS — E785 Hyperlipidemia, unspecified: Secondary | ICD-10-CM | POA: Diagnosis not present

## 2014-10-14 DIAGNOSIS — Z872 Personal history of diseases of the skin and subcutaneous tissue: Secondary | ICD-10-CM | POA: Diagnosis not present

## 2014-10-14 DIAGNOSIS — Z8669 Personal history of other diseases of the nervous system and sense organs: Secondary | ICD-10-CM | POA: Diagnosis not present

## 2014-10-14 DIAGNOSIS — E039 Hypothyroidism, unspecified: Secondary | ICD-10-CM | POA: Diagnosis not present

## 2014-10-14 LAB — BASIC METABOLIC PANEL
ANION GAP: 8 (ref 5–15)
BUN: 11 mg/dL (ref 6–23)
CALCIUM: 9.3 mg/dL (ref 8.4–10.5)
CO2: 29 mmol/L (ref 19–32)
Chloride: 102 mmol/L (ref 96–112)
Creatinine, Ser: 1.02 mg/dL (ref 0.50–1.10)
GFR calc Af Amer: 64 mL/min — ABNORMAL LOW (ref >90)
GFR, EST NON AFRICAN AMERICAN: 55 mL/min — AB (ref >90)
GLUCOSE: 105 mg/dL — AB (ref 70–99)
Potassium: 3.7 mmol/L (ref 3.5–5.1)
Sodium: 139 mmol/L (ref 135–145)

## 2014-10-14 LAB — CBC WITH DIFFERENTIAL/PLATELET
Basophils Absolute: 0 10*3/uL (ref 0.0–0.1)
Basophils Relative: 0 % (ref 0–1)
EOS ABS: 0 10*3/uL (ref 0.0–0.7)
Eosinophils Relative: 0 % (ref 0–5)
HEMATOCRIT: 39.5 % (ref 36.0–46.0)
HEMOGLOBIN: 12.7 g/dL (ref 12.0–15.0)
Lymphocytes Relative: 10 % — ABNORMAL LOW (ref 12–46)
Lymphs Abs: 1.4 10*3/uL (ref 0.7–4.0)
MCH: 30.2 pg (ref 26.0–34.0)
MCHC: 32.2 g/dL (ref 30.0–36.0)
MCV: 94 fL (ref 78.0–100.0)
MONO ABS: 0.5 10*3/uL (ref 0.1–1.0)
MONOS PCT: 4 % (ref 3–12)
NEUTROS ABS: 11.7 10*3/uL — AB (ref 1.7–7.7)
Neutrophils Relative %: 86 % — ABNORMAL HIGH (ref 43–77)
PLATELETS: 172 10*3/uL (ref 150–400)
RBC: 4.2 MIL/uL (ref 3.87–5.11)
RDW: 14.2 % (ref 11.5–15.5)
WBC: 13.7 10*3/uL — ABNORMAL HIGH (ref 4.0–10.5)

## 2014-10-14 MED ORDER — SODIUM CHLORIDE 0.9 % IV SOLN: INTRAVENOUS | Status: DC

## 2014-10-14 MED ORDER — TRAMADOL HCL 50 MG PO TABS: 50.0000 mg | ORAL_TABLET | Freq: Four times a day (QID) | ORAL | Status: DC | PRN

## 2014-10-14 MED ORDER — CLINDAMYCIN HCL 300 MG PO CAPS: 300.0000 mg | ORAL_CAPSULE | Freq: Three times a day (TID) | ORAL | Status: DC

## 2014-10-14 MED ORDER — VANCOMYCIN HCL 10 G IV SOLR
2000.0000 mg | INTRAVENOUS | Status: AC
  Administered 2014-10-14: 2000 mg via INTRAVENOUS
  Filled 2014-10-14: qty 2000

## 2014-10-14 MED ORDER — VANCOMYCIN HCL IN DEXTROSE 1-5 GM/200ML-% IV SOLN
1000.0000 mg | Freq: Two times a day (BID) | INTRAVENOUS | Status: DC
Start: 2014-10-15 — End: 2014-10-14

## 2014-10-14 NOTE — Progress Notes (Signed)
ANTIBIOTIC CONSULT NOTE - INITIAL  Pharmacy Consult for vancomycin Indication: cellulitis  Allergies  Allergen Reactions  . Cephalexin Other (See Comments)    Exfoliative dermatitis reaction  . Codeine     REACTION: unknown - pt. states unaware of having reaction to codeine  . Lisinopril     REACTION: cough  . Penicillins     rash    Patient Measurements: Weight: (!) 345 lb 3.2 oz (156.582 kg)  IBW: 58kg Adjusted Body Weight: 97.5kg  Vital Signs: Temp: 99.9 F (37.7 C) (01/25 1240) Temp Source: Oral (01/25 1240) BP: 110/49 mmHg (01/25 1240) Pulse Rate: 110 (01/25 1240) Intake/Output from previous day:   Intake/Output from this shift:    Labs:  Recent Labs  10/14/14 1321  WBC 13.7*  HGB 12.7  PLT 172  CREATININE 1.02   Estimated Creatinine Clearance: 81.3 mL/min (by C-G formula based on Cr of 1.02).  Medical History: Past Medical History  Diagnosis Date  . Hypertension   . Hyperlipidemia   . Anemia   . CAD (coronary artery disease)     a. Nonobstructive CAD 2007 (EF 75%.  RCA  proximal 75% tubular, 25% prox LAD, 25% d1, 50% D2) at [facility]. b. NSTEMI 01/2010 in setting of respiratory failure, medical approach (not a good candidate for noninvasive eval)  . OSA on CPAP   . Hypothyroidism   . Depression 06/13/2011  . Neuropathy 06/13/2011  . Cervical radiculopathy 06/13/2011  . Gout 06/13/2011  . Obesity hypoventilation syndrome 06/13/2011  . COPD (chronic obstructive pulmonary disease)     on O2  . Breast cancer dx'd 2005    No BP check or stick in Left arm  . Cellulitis     hx of cellulitis in left leg  . Impaired glucose tolerance 11/01/2012  . PFO (patent foramen ovale)   . Atrial flutter   . Atrial fibrillation   . Syncope and collapse     Bradycardia with pauses. S/P pacemaker  . Risk for falls   . Asthma 06/13/2011    hx of  . Hx of dysfunctional uterine bleeding     last bleeding jan 2016, worked up for with d and c x 2 no cause found  . History of  Clostridium difficile early dec 2015    no current diarrhea  . History of home oxygen therapy     2 liters per nasal cannula all the time    Medications:  Scheduled:   Infusions:  . sodium chloride    . vancomycin     Assessment: 69yo obese F with extensive PMH in the ED 1/25 w/ L breast pain. Patient reports it feels like cellulitis she has had before. Pharmacy is asked to dose Vancomycin.   Antiinfectives  1/25 >> vancomycin >>  Labs / vitals Tmax: 99.9 WBCs: 13.7 Renal: SCr 1.02 (baseline appears ~1.0), CrCl 81 ml/min CG, 60N  No microbiologic data this admission   Goal of Therapy:  Vancomycin trough level 10-15 mcg/ml  Plan:  - vancomycin 2g IV x 1 now as a loading dose - vancomycin 1g IV q12h to start 1/26 at 0400 - vancomycin trough at steady state if indicated - follow-up clinical course, culture results, renal function - follow-up antibiotic de-escalation and length of therapy  Thank you for the consult.  Currie Paris, PharmD, BCPS Pager: [phone] Pharmacy: [phone] 10/14/2014 2:32 PM

## 2014-10-14 NOTE — ED Provider Notes (Addendum)
CSN: [id number]     Arrival date & time 10/14/14  1227 History   First MD Initiated Contact with Patient 10/14/14 1252     Chief Complaint  Patient presents with  . left breast pain    Patient is a 69 y.o. female presenting with chest pain. The history is provided by the patient.  Chest Pain Chest pain location: left breast. Pain quality: sharp   Pain radiates to:  Does not radiate Pain severity:  Moderate Onset quality:  Gradual Timing:  Constant Progression:  Worsening Chronicity:  Recurrent (Pt has history of breast ca in the past.  She has had trouble with infection in the breast intermittently since then.) Exacerbated by: palpation. Associated symptoms: fever   Associated symptoms: no cough  Shortness of breath: sometimes a little when walking, that is not a new issue for her.   Risk factors comment:  Hx of prior breast CA and cellulitis in the left breast   Past Medical History  Diagnosis Date  . Hypertension   . Hyperlipidemia   . Anemia   . CAD (coronary artery disease)     a. Nonobstructive CAD 2007 (EF 75%.  RCA  proximal 75% tubular, 25% prox LAD, 25% d1, 50% D2) at [facility]. b. NSTEMI 01/2010 in setting of respiratory failure, medical approach (not a good candidate for noninvasive eval)  . OSA on CPAP   . Hypothyroidism   . Depression 06/13/2011  . Neuropathy 06/13/2011  . Cervical radiculopathy 06/13/2011  . Gout 06/13/2011  . Obesity hypoventilation syndrome 06/13/2011  . COPD (chronic obstructive pulmonary disease)     on O2  . Breast cancer dx'd 2005    No BP check or stick in Left arm  . Cellulitis     hx of cellulitis in left leg  . Impaired glucose tolerance 11/01/2012  . PFO (patent foramen ovale)   . Atrial flutter   . Atrial fibrillation   . Syncope and collapse     Bradycardia with pauses. S/P pacemaker  . Risk for falls   . Asthma 06/13/2011    hx of  . Hx of dysfunctional uterine bleeding     last bleeding jan 2016, worked up for with d and c x 2 no  cause found  . History of Clostridium difficile early dec 2015    no current diarrhea  . History of home oxygen therapy     2 liters per nasal cannula all the time   Past Surgical History  Procedure Laterality Date  . Ovarian cyst removal    . Breast lumpectomy Left   . Cervical disc surgery  2004 or 2005    have cadaver bones,, screws, and plates c 5 to c 6  . Cervical biopsy  June 2013    at [facility] for Heavy  Bleeding  . Dilation and curettage of uterus    . Colonoscopy    . Laparoscopic appendectomy  03/29/2012    Procedure: APPENDECTOMY LAPAROSCOPIC;  Surgeon: Adin Hector, MD;  Location: [facility];  Service: General;  Laterality: N/A;  . Tee without cardioversion N/A 03/15/2013    Procedure: TRANSESOPHAGEAL ECHOCARDIOGRAM (TEE);  Surgeon: Thayer Headings, MD;  Location: [place];  Service: Cardiovascular;  Laterality: N/A;  . Cardioversion N/A 03/15/2013    Procedure: CARDIOVERSION;  Surgeon: Thayer Headings, MD;  Location: [facility] ENDOSCOPY;  Service: Cardiovascular;  Laterality: N/A;  . Loop recorder implant  08-28-2013; 08-31-2013    MDT LinQ implanted by Dr Rayann Heman for syncope;  explanted 08-31-2013 after sinus pauses identified  . Pacemaker insertion  08-31-2013    Biotronik Evera dual chamber pacemaker placed for neurocardiogenic syncope and sinus pauses by Dr Rayann Heman  . Loop recorder implant N/A 08/28/2013    Procedure: LOOP RECORDER IMPLANT;  Surgeon: Coralyn Mark, MD;  Location: [place] CATH LAB;  Service: Cardiovascular;  Laterality: N/A;  . Permanent pacemaker insertion N/A 08/31/2013    Procedure: PERMANENT PACEMAKER INSERTION;  Surgeon: Coralyn Mark, MD;  Location: [place] CATH LAB;  Service: Cardiovascular;  Laterality: N/A;  . Loop recorder explant  08/31/2013    Procedure: LOOP RECORDER EXPLANT;  Surgeon: Coralyn Mark, MD;  Location: [place] CATH LAB;  Service: Cardiovascular;;   Family History  Problem Relation Age of Onset  . Uterine cancer Mother   . Heart disease Mother   .  Breast cancer Sister 28  . Ovarian cancer Sister   . Diabetes Brother   . Breast cancer Maternal Aunt   . Breast cancer Cousin 65  . Ovarian cancer Sister    History  Substance Use Topics  . Smoking status: Former Smoker -- 1.00 packs/day for 20 years    Types: Cigarettes    Quit date: 09/20/1998  . Smokeless tobacco: Never Used     Comment: 1ppd x 20 years  . Alcohol Use: 0.6 oz/week    1 Glasses of wine per week     Comment: rare   OB History    No data available     Review of Systems  Constitutional: Positive for fever.  Respiratory: Negative for cough. Shortness of breath: sometimes a little when walking, that is not a new issue for her.   Cardiovascular: Positive for chest pain.  All other systems reviewed and are negative.     Allergies  Cephalexin; Codeine; Lisinopril; and Penicillins  Home Medications   Prior to Admission medications   Medication Sig Start Date End Date Taking? Authorizing Provider  albuterol (PROVENTIL HFA;VENTOLIN HFA) 108 (90 BASE) MCG/ACT inhaler Inhale 2 puffs into the lungs every 6 (six) hours as needed for wheezing or shortness of breath.   Yes Historical Provider, MD  allopurinol (ZYLOPRIM) 300 MG tablet TAKE 1 TABLET DAILY 06/20/14  Yes Biagio Borg, MD  amitriptyline (ELAVIL) 100 MG tablet TAKE 1 TABLET AT BEDTIME 06/03/14  Yes Rowe Clack, MD  Cholecalciferol (VITAMIN D) 2000 UNITS tablet Take 2,000 Units by mouth at bedtime.   Yes Historical Provider, MD  DULoxetine (CYMBALTA) 30 MG capsule TAKE 1 CAPSULE TWICE A DAY Patient taking differently: TAKE 2 CAPSULE at bedtime 07/29/14  Yes Biagio Borg, MD  ELIQUIS 5 MG TABS tablet TAKE 1 TABLET TWICE A DAY 06/04/14  Yes Biagio Borg, MD  ferrous sulfate 325 (65 FE) MG tablet Take 325 mg by mouth daily with breakfast.   Yes Historical Provider, MD  furosemide (LASIX) 40 MG tablet TAKE 1 TABLET EVERY MORNING AND TAKE 1 TABLET EVERY OTHER NIGHT AS DIRECTED 05/28/14  Yes Biagio Borg, MD   Lactobacillus Rhamnosus, GG, (CULTURELLE PO) Take 1 tablet by mouth daily.   Yes Historical Provider, MD  levothyroxine (SYNTHROID, LEVOTHROID) 200 MCG tablet TAKE 1 TABLET DAILY 06/04/14  Yes Biagio Borg, MD  metoprolol tartrate (LOPRESSOR) 25 MG tablet Take 1 tablet (25 mg total) by mouth 2 (two) times daily. 11/19/13  Yes Biagio Borg, MD  Omega-3 Fatty Acids (FISH OIL PO) Take 1 capsule by mouth daily.    Yes Historical Provider,  MD  OXYGEN Inhale into the lungs. Continuous O2 @ 2LMP   Yes Historical Provider, MD  potassium chloride SA (K-DUR,KLOR-CON) 20 MEQ tablet TAKE 1 TABLET THREE TIMES A DAY Patient taking differently: TAKE 1 TABLET two TIMES A DAY 07/04/14  Yes Lelon Perla, MD  pravastatin (PRAVACHOL) 40 MG tablet Take 1 tablet (40 mg total) by mouth daily. 10/13/14  Yes Lelon Perla, MD  vitamin C (ASCORBIC ACID) 500 MG tablet Take 500 mg by mouth daily.    Yes Historical Provider, MD  peg 3350 powder (MOVIPREP) 100 G SOLR Take 1 kit (200 g total) by mouth once. 09/10/14   Jerene Bears, MD   BP 113/85 mmHg  Pulse 100  Temp(Src) 99.9 F (37.7 C) (Oral)  Resp 21  Wt 345 lb 3.2 oz (156.582 kg)  SpO2 99% Physical Exam  Constitutional: She appears well-developed and well-nourished. No distress.  Obese   HENT:  Head: Normocephalic and atraumatic.  Right Ear: External ear normal.  Left Ear: External ear normal.  Eyes: Conjunctivae are normal. Right eye exhibits no discharge. Left eye exhibits no discharge. No scleral icterus.  Neck: Neck supple. No tracheal deviation present.  Cardiovascular: Normal rate, regular rhythm and intact distal pulses.   Pulmonary/Chest: Effort normal and breath sounds normal. No stridor. No respiratory distress. She has no wheezes. She has no rales. Left breast exhibits skin change and tenderness. Left breast exhibits no mass.  ttp left breast, erythema induratin and ttp of the skin left breast with some streaking toward the right, few  superficial scratches on the right breast tissue, no fluctuance of the breast  Abdominal: Soft. Bowel sounds are normal. She exhibits no distension. There is no tenderness. There is no rebound and no guarding.  Musculoskeletal: She exhibits no edema or tenderness.  Neurological: She is alert. She has normal strength. No cranial nerve deficit (no facial droop, extraocular movements intact, no slurred speech) or sensory deficit. She exhibits normal muscle tone. She displays no seizure activity. Coordination normal.  Skin: Skin is warm and dry. No rash noted.  Psychiatric: She has a normal mood and affect.  Nursing note and vitals reviewed.   ED Course  Procedures (including critical care time) Labs Review Labs Reviewed  CBC WITH DIFFERENTIAL/PLATELET - Abnormal; Notable for the following:    WBC 13.7 (*)    Neutrophils Relative % 86 (*)    Neutro Abs 11.7 (*)    Lymphocytes Relative 10 (*)    All other components within normal limits  BASIC METABOLIC PANEL - Abnormal; Notable for the following:    Glucose, Bld 105 (*)    GFR calc non Af Amer 55 (*)    GFR calc Af Amer 64 (*)    All other components within normal limits  I-STAT CG4 LACTIC ACID, ED    Medications  0.9 %  sodium chloride infusion (not administered)  vancomycin (VANCOCIN) 2,000 mg in sodium chloride 0.9 % 500 mL IVPB (2,000 mg Intravenous New Bag/Given 10/14/14 1441)  vancomycin (VANCOCIN) IVPB 1000 mg/200 mL premix (not administered)     MDM   Final diagnoses:  Cellulitis of breast   Cellulitis in breast that has chronic post radiation changes ,. Impaired lymphatic drainage. Pt is feeling well.  I discussed options of inpatient treatment vs outpatient treatment with close follow up .  Pt would prefer to go home.  She has had successful treatment with oral abx in the past.     Dorie Rank,  MD 10/14/14 1548  Reviewed prior records.  Previously responded to clindamycin.  Will start on same med as outpatient.   Follow up with PCP in 2 days   Dorie Rank, MD 10/14/14 [phone]

## 2014-10-14 NOTE — Discharge Instructions (Signed)

## 2014-10-14 NOTE — ED Notes (Signed)
Pt c/o left breast pain that started last night. Pt states that she has had cellulitis on left breast in the past and states that is what is happening again. Pt states that she has had fever as well.

## 2014-10-15 ENCOUNTER — Encounter: Payer: Self-pay | Admitting: Gastroenterology

## 2014-10-15 ENCOUNTER — Encounter: Payer: Self-pay | Admitting: Internal Medicine

## 2014-10-15 LAB — I-STAT CG4 LACTIC ACID, ED: Lactic Acid, Venous: 1.96 mmol/L (ref 0.5–2.0)

## 2014-10-15 MED ORDER — SULFAMETHOXAZOLE-TRIMETHOPRIM 800-160 MG PO TABS: 1.0000 | ORAL_TABLET | Freq: Two times a day (BID) | ORAL | Status: DC

## 2014-10-15 NOTE — Telephone Encounter (Signed)
Robin to see above, call pt to emphasize recommendations and rationale, I will do new rx, pt will need ROV in 3 days help making appt

## 2014-10-16 ENCOUNTER — Encounter: Payer: Self-pay | Admitting: Internal Medicine

## 2014-10-18 ENCOUNTER — Ambulatory Visit: Payer: Medicare Other | Admitting: Internal Medicine

## 2014-10-22 ENCOUNTER — Ambulatory Visit (HOSPITAL_COMMUNITY): Payer: Medicare Other | Admitting: Anesthesiology

## 2014-10-22 ENCOUNTER — Telehealth: Payer: Self-pay | Admitting: Pulmonary Disease

## 2014-10-22 ENCOUNTER — Encounter (HOSPITAL_COMMUNITY)
Admission: RE | Disposition: A | Discharge status: Home / Self Care | Payer: Self-pay | Source: Ambulatory Visit | Attending: Internal Medicine

## 2014-10-22 ENCOUNTER — Ambulatory Visit (HOSPITAL_COMMUNITY): Payer: Medicare Other

## 2014-10-22 ENCOUNTER — Encounter (HOSPITAL_COMMUNITY): Payer: Self-pay | Admitting: Internal Medicine

## 2014-10-22 ENCOUNTER — Telehealth: Payer: Self-pay | Admitting: Internal Medicine

## 2014-10-22 ENCOUNTER — Ambulatory Visit (HOSPITAL_COMMUNITY)
Admission: RE | Admit: 2014-10-22 | Discharge: 2014-10-22 | Disposition: A | Discharge status: Home / Self Care | Payer: Medicare Other | Source: Ambulatory Visit | Attending: Internal Medicine | Admitting: Internal Medicine

## 2014-10-22 ENCOUNTER — Encounter: Payer: Self-pay | Admitting: Gastroenterology

## 2014-10-22 DIAGNOSIS — I252 Old myocardial infarction: Secondary | ICD-10-CM | POA: Diagnosis not present

## 2014-10-22 DIAGNOSIS — M109 Gout, unspecified: Secondary | ICD-10-CM | POA: Insufficient documentation

## 2014-10-22 DIAGNOSIS — Z9981 Dependence on supplemental oxygen: Secondary | ICD-10-CM | POA: Diagnosis not present

## 2014-10-22 DIAGNOSIS — D120 Benign neoplasm of cecum: Secondary | ICD-10-CM | POA: Diagnosis not present

## 2014-10-22 DIAGNOSIS — Z9889 Other specified postprocedural states: Secondary | ICD-10-CM | POA: Diagnosis not present

## 2014-10-22 DIAGNOSIS — J45909 Unspecified asthma, uncomplicated: Secondary | ICD-10-CM | POA: Insufficient documentation

## 2014-10-22 DIAGNOSIS — Z87891 Personal history of nicotine dependence: Secondary | ICD-10-CM | POA: Insufficient documentation

## 2014-10-22 DIAGNOSIS — Z860100 Personal history of colon polyps, unspecified: Secondary | ICD-10-CM | POA: Insufficient documentation

## 2014-10-22 DIAGNOSIS — I2510 Atherosclerotic heart disease of native coronary artery without angina pectoris: Secondary | ICD-10-CM | POA: Insufficient documentation

## 2014-10-22 DIAGNOSIS — D123 Benign neoplasm of transverse colon: Secondary | ICD-10-CM | POA: Diagnosis not present

## 2014-10-22 DIAGNOSIS — I10 Essential (primary) hypertension: Secondary | ICD-10-CM | POA: Insufficient documentation

## 2014-10-22 DIAGNOSIS — J449 Chronic obstructive pulmonary disease, unspecified: Secondary | ICD-10-CM | POA: Diagnosis not present

## 2014-10-22 DIAGNOSIS — G4733 Obstructive sleep apnea (adult) (pediatric): Secondary | ICD-10-CM | POA: Diagnosis not present

## 2014-10-22 DIAGNOSIS — Z950 Presence of cardiac pacemaker: Secondary | ICD-10-CM | POA: Diagnosis not present

## 2014-10-22 DIAGNOSIS — E785 Hyperlipidemia, unspecified: Secondary | ICD-10-CM | POA: Insufficient documentation

## 2014-10-22 DIAGNOSIS — K648 Other hemorrhoids: Secondary | ICD-10-CM | POA: Diagnosis not present

## 2014-10-22 DIAGNOSIS — Z6841 Body Mass Index (BMI) 40.0 and over, adult: Secondary | ICD-10-CM | POA: Insufficient documentation

## 2014-10-22 DIAGNOSIS — F329 Major depressive disorder, single episode, unspecified: Secondary | ICD-10-CM | POA: Diagnosis not present

## 2014-10-22 DIAGNOSIS — R042 Hemoptysis: Secondary | ICD-10-CM

## 2014-10-22 DIAGNOSIS — I509 Heart failure, unspecified: Secondary | ICD-10-CM | POA: Diagnosis not present

## 2014-10-22 DIAGNOSIS — I4891 Unspecified atrial fibrillation: Secondary | ICD-10-CM | POA: Diagnosis not present

## 2014-10-22 DIAGNOSIS — Z853 Personal history of malignant neoplasm of breast: Secondary | ICD-10-CM | POA: Diagnosis not present

## 2014-10-22 DIAGNOSIS — G629 Polyneuropathy, unspecified: Secondary | ICD-10-CM | POA: Diagnosis not present

## 2014-10-22 DIAGNOSIS — E039 Hypothyroidism, unspecified: Secondary | ICD-10-CM | POA: Insufficient documentation

## 2014-10-22 DIAGNOSIS — J810 Acute pulmonary edema: Secondary | ICD-10-CM | POA: Diagnosis not present

## 2014-10-22 DIAGNOSIS — I517 Cardiomegaly: Secondary | ICD-10-CM | POA: Diagnosis not present

## 2014-10-22 DIAGNOSIS — Z09 Encounter for follow-up examination after completed treatment for conditions other than malignant neoplasm: Secondary | ICD-10-CM | POA: Diagnosis present

## 2014-10-22 DIAGNOSIS — K5730 Diverticulosis of large intestine without perforation or abscess without bleeding: Secondary | ICD-10-CM | POA: Insufficient documentation

## 2014-10-22 DIAGNOSIS — J811 Chronic pulmonary edema: Secondary | ICD-10-CM | POA: Diagnosis not present

## 2014-10-22 HISTORY — PX: COLONOSCOPY WITH PROPOFOL: SHX5780

## 2014-10-22 HISTORY — DX: Reserved for inherently not codable concepts without codable children: IMO0001

## 2014-10-22 HISTORY — DX: Dependence on supplemental oxygen: Z99.81

## 2014-10-22 HISTORY — DX: Personal history of other diseases of the female genital tract: Z87.42

## 2014-10-22 SURGERY — COLONOSCOPY WITH PROPOFOL
Anesthesia: Monitor Anesthesia Care

## 2014-10-22 MED ORDER — SODIUM CHLORIDE 0.9 % IV SOLN
INTRAVENOUS | Status: DC
  Administered 2014-10-22: 11:00:00 via INTRAVENOUS

## 2014-10-22 MED ORDER — KETAMINE HCL 10 MG/ML IJ SOLN
INTRAMUSCULAR | Status: DC | PRN
  Administered 2014-10-22: 10 mg via INTRAVENOUS
  Administered 2014-10-22: 5 mg via INTRAVENOUS
  Administered 2014-10-22: 10 mg via INTRAVENOUS

## 2014-10-22 MED ORDER — PROPOFOL 10 MG/ML IV BOLUS
INTRAVENOUS | Status: AC
  Filled 2014-10-22: qty 20

## 2014-10-22 MED ORDER — LIDOCAINE HCL 1 % IJ SOLN: INTRAMUSCULAR | Status: DC | PRN

## 2014-10-22 MED ORDER — MIDAZOLAM HCL 5 MG/5ML IJ SOLN
INTRAMUSCULAR | Status: DC | PRN
  Administered 2014-10-22 (×2): 1 mg via INTRAVENOUS

## 2014-10-22 MED ORDER — ALBUTEROL SULFATE (2.5 MG/3ML) 0.083% IN NEBU
INHALATION_SOLUTION | RESPIRATORY_TRACT | Status: AC
  Filled 2014-10-22: qty 3

## 2014-10-22 MED ORDER — SODIUM CHLORIDE 0.9 % IV SOLN
INTRAVENOUS | Status: DC
  Administered 2014-10-22: 1000 mL via INTRAVENOUS

## 2014-10-22 MED ORDER — LIDOCAINE HCL (CARDIAC) 20 MG/ML IV SOLN
INTRAVENOUS | Status: AC
  Filled 2014-10-22: qty 5

## 2014-10-22 MED ORDER — ALBUTEROL SULFATE (2.5 MG/3ML) 0.083% IN NEBU
2.5000 mg | INHALATION_SOLUTION | Freq: Once | RESPIRATORY_TRACT | Status: AC
  Administered 2014-10-22: 2.5 mg via RESPIRATORY_TRACT

## 2014-10-22 MED ORDER — MIDAZOLAM HCL 2 MG/2ML IJ SOLN
INTRAMUSCULAR | Status: AC
  Filled 2014-10-22: qty 2

## 2014-10-22 MED ORDER — PROPOFOL INFUSION 10 MG/ML OPTIME
INTRAVENOUS | Status: DC | PRN
  Administered 2014-10-22: 200 ug/kg/min via INTRAVENOUS

## 2014-10-22 MED ORDER — LIDOCAINE HCL 1 % IJ SOLN
INTRAMUSCULAR | Status: DC | PRN
  Administered 2014-10-22: 50 mg via INTRADERMAL

## 2014-10-22 SURGICAL SUPPLY — 21 items

## 2014-10-22 NOTE — H&P (Signed)
HPI: [name] [name] is a 69 yo female with PMH of adenomatous colon polyps, C. difficile colitis, oxygen dependent COPD, sleep apnea, remote history of breast cancer, CAD, atrial fibrillation on Eliquis who presents today for colonoscopy. This colonoscopy is being performed for polyp surveillance. We had a long discussion in the office regarding discontinuation of screening but she adamantly wishes to pursue surveillance colonoscopy. We discussed the risks, benefits and alternatives at length and she asks for colonoscopy. Last colonoscopy was at [facility] in 2011 where 3 polyps were removed and pathology report revealed mixture of adenomatous and hyperplastic polyps. She does have a history of C. difficile which has resolved. She denies abdominal pain, rectal bleeding, change in bowel habit otherwise. She stopped her anticoagulant 5 days ago.  She has been on clindamycin for cellulitis of the left breast. She has noted diffuse itching on her back and upper extremities bilaterally. Scaling rash beginning, she is worried about a reaction. She has been using Benadryl.  Past Medical History  Diagnosis Date  . Hypertension   . Hyperlipidemia   . Anemia   . CAD (coronary artery disease)     a. Nonobstructive CAD 2007 (EF 75%.  RCA  proximal 75% tubular, 25% prox LAD, 25% d1, 50% D2) at [facility]. b. NSTEMI 01/2010 in setting of respiratory failure, medical approach (not a good candidate for noninvasive eval)  . OSA on CPAP   . Hypothyroidism   . Depression 06/13/2011  . Neuropathy 06/13/2011  . Cervical radiculopathy 06/13/2011  . Gout 06/13/2011  . Obesity hypoventilation syndrome 06/13/2011  . COPD (chronic obstructive pulmonary disease)     on O2  . Breast cancer dx'd 2005    No BP check or stick in Left arm  . Cellulitis     hx of cellulitis in left leg  . Impaired glucose tolerance 11/01/2012  . PFO (patent foramen ovale)   . Atrial flutter   . Atrial fibrillation   . Syncope and collapse     Bradycardia  with pauses. S/P pacemaker  . Risk for falls   . Asthma 06/13/2011    hx of  . Hx of dysfunctional uterine bleeding     last bleeding jan 2016, worked up for with d and c x 2 no cause found  . History of Clostridium difficile early dec 2015    no current diarrhea  . History of home oxygen therapy     2 liters per nasal cannula all the time    Past Surgical History  Procedure Laterality Date  . Ovarian cyst removal    . Breast lumpectomy Left   . Cervical disc surgery  2004 or 2005    have cadaver bones,, screws, and plates c 5 to c 6  . Cervical biopsy  June 2013    at [facility] for Heavy  Bleeding  . Dilation and curettage of uterus    . Colonoscopy    . Laparoscopic appendectomy  03/29/2012    Procedure: APPENDECTOMY LAPAROSCOPIC;  Surgeon: Adin Hector, MD;  Location: [facility];  Service: General;  Laterality: N/A;  . Tee without cardioversion N/A 03/15/2013    Procedure: TRANSESOPHAGEAL ECHOCARDIOGRAM (TEE);  Surgeon: Thayer Headings, MD;  Location: [place];  Service: Cardiovascular;  Laterality: N/A;  . Cardioversion N/A 03/15/2013    Procedure: CARDIOVERSION;  Surgeon: Thayer Headings, MD;  Location: [facility] ENDOSCOPY;  Service: Cardiovascular;  Laterality: N/A;  . Loop recorder implant  08-28-2013; 08-31-2013    MDT LinQ implanted by  Dr Rayann Heman for syncope; explanted 08-31-2013 after sinus pauses identified  . Pacemaker insertion  08-31-2013    Biotronik Evera dual chamber pacemaker placed for neurocardiogenic syncope and sinus pauses by Dr Rayann Heman  . Loop recorder implant N/A 08/28/2013    Procedure: LOOP RECORDER IMPLANT;  Surgeon: Coralyn Mark, MD;  Location: [place] CATH LAB;  Service: Cardiovascular;  Laterality: N/A;  . Permanent pacemaker insertion N/A 08/31/2013    Procedure: PERMANENT PACEMAKER INSERTION;  Surgeon: Coralyn Mark, MD;  Location: [place] CATH LAB;  Service: Cardiovascular;  Laterality: N/A;  . Loop recorder explant  08/31/2013    Procedure: LOOP RECORDER EXPLANT;   Surgeon: Coralyn Mark, MD;  Location: [place] CATH LAB;  Service: Cardiovascular;;     (Not in an outpatient encounter)  Allergies  Allergen Reactions  . Cephalexin Other (See Comments)    Exfoliative dermatitis reaction  . Codeine     REACTION: unknown - pt. states unaware of having reaction to codeine  . Lisinopril     REACTION: cough  . Penicillins     rash  . Cleocin [Clindamycin Hcl] Rash    rash    Family History  Problem Relation Age of Onset  . Uterine cancer Mother   . Heart disease Mother   . Breast cancer Sister 24  . Ovarian cancer Sister   . Diabetes Brother   . Breast cancer Maternal Aunt   . Breast cancer Cousin 98  . Ovarian cancer Sister     History  Substance Use Topics  . Smoking status: Former Smoker -- 1.00 packs/day for 20 years    Types: Cigarettes    Quit date: 09/20/1998  . Smokeless tobacco: Never Used     Comment: 1ppd x 20 years  . Alcohol Use: 0.6 oz/week    1 Glasses of wine per week     Comment: rare    ROS: As per history of present illness, otherwise negative  BP 134/56 mmHg  Pulse 79  Temp(Src) 98 F (36.7 C) (Oral)  Resp 20  Ht 5' 5"  (1.651 m)  Wt 345 lb (156.491 kg)  BMI 57.41 kg/m2  SpO2 95%  Gen: awake, alert, NAD HEENT: anicteric, op clear CV: RRR, no mrg Pulm: CTA b/l Abd: soft, NT/ND, +BS throughout Ext: no c/c/e Neuro: nonfocal Skin: Scaling exfoliative rash upper back without ulceration or pustules   RELEVANT LABS AND IMAGING: CBC    Component Value Date/Time   WBC 13.7* 10/14/2014 1321   WBC 6.0 06/26/2014 0819   RBC 4.20 10/14/2014 1321   RBC 4.40 06/26/2014 0819   HGB 12.7 10/14/2014 1321   HGB 12.9 06/26/2014 0819   HCT 39.5 10/14/2014 1321   HCT 41.0 06/26/2014 0819   PLT 172 10/14/2014 1321   PLT 171 06/26/2014 0819   MCV 94.0 10/14/2014 1321   MCV 93.0 06/26/2014 0819   MCH 30.2 10/14/2014 1321   MCH 29.4 06/26/2014 0819   MCHC 32.2 10/14/2014 1321   MCHC 31.6 06/26/2014 0819   RDW  14.2 10/14/2014 1321   RDW 14.5 06/26/2014 0819   LYMPHSABS 1.4 10/14/2014 1321   LYMPHSABS 2.2 06/26/2014 0819   MONOABS 0.5 10/14/2014 1321   MONOABS 0.5 06/26/2014 0819   EOSABS 0.0 10/14/2014 1321   EOSABS 0.2 06/26/2014 0819   BASOSABS 0.0 10/14/2014 1321   BASOSABS 0.0 06/26/2014 0819    CMP     Component Value Date/Time   NA 139 10/14/2014 1321   NA 142 06/26/2014 0819  K 3.7 10/14/2014 1321   K 3.9 06/26/2014 0819   CL 102 10/14/2014 1321   CL 97* 12/28/2012 0930   CO2 29 10/14/2014 1321   CO2 31* 06/26/2014 0819   GLUCOSE 105* 10/14/2014 1321   GLUCOSE 106 06/26/2014 0819   GLUCOSE 111* 12/28/2012 0930   BUN 11 10/14/2014 1321   BUN 5.8* 06/26/2014 0819   CREATININE 1.02 10/14/2014 1321   CREATININE 0.8 06/26/2014 0819   CALCIUM 9.3 10/14/2014 1321   CALCIUM 9.8 06/26/2014 0819   PROT 7.2 06/26/2014 0819   PROT 8.1 05/31/2014 1032   ALBUMIN 3.4* 06/26/2014 0819   ALBUMIN 4.0 05/31/2014 1032   AST 39* 06/26/2014 0819   AST 50* 05/31/2014 1032   ALT 35 06/26/2014 0819   ALT 39* 05/31/2014 1032   ALKPHOS 93 06/26/2014 0819   ALKPHOS 104 05/31/2014 1032   BILITOT 0.39 06/26/2014 0819   BILITOT 0.5 05/31/2014 1032   GFRNONAA 55* 10/14/2014 1321   GFRAA 64* 10/14/2014 1321   Colonoscopy from [facility] March 2011: Surgical Pathology - Final result (12/18/2009 1:00 AM EDT) Surgical Pathology - Final result (12/18/2009 1:00 AM EDT)  Component Value Range  Clinical History High risk colon cancer surveillance: personal history of colonic polyps, last colonoscopy: 2008.  Impression: Preparation of the colon is fair.One 3 mm polyp in the cecum. Resected and retrieved.One 7 mm polyp in the transverse colon. Resected and retrieved.Diverticulosis sigmoid colon, descending colon and transverse colon.   Gross Examination A. "Colon polyps", received in formalin on filter paper are five fragments of pink-tan tissue up to 0.7 x 0.4 x 0.2 cm, submitted in toto in A1  for special processing.  Also received in the container is a 0.9 x 0.6 x 0.3 cm tan to pink polyp, with the margin inked blue, and bisected and submitted entirely in A2.  N. Gerken/Dr. Madden/slides to Dr. Jeris Penta   Microscopic Examination Microscopic examination is performed.   Diagnosis A.COLON POLYPS, ENDOSCOPIC POLYPECTOMY: TUBULAR ADENOMA AND A HYPERPLASTIC POLYP. Comment:  I certify that I personally conducted the diagnostic evaluation of the above specimen(s) and have rendered the above diagnosis(es). Danne Harbor, M.D. Electronically signed: 12/19/09       ASSESSMENT/PLAN:  69 yo female with PMH of adenomatous colon polyps, C. difficile colitis, oxygen dependent COPD, sleep apnea, remote history of breast cancer, CAD, atrial fibrillation on Eliquis who presents today for colonoscopy.  1. Hx of adenomatous colon polyps/colorectal cancer surveillance -- she has stopped her anticoagulation appropriately with consent from her prescribing physician. Colonoscopy planned today. HIGHER THAN BASELINE RISK.The nature of the procedure, as well as the risks, benefits, and alternatives were carefully and thoroughly reviewed with the patient. Ample time for discussion and questions allowed. The patient understood, was satisfied, and agreed to proceed.   1. Rash -- probable reaction to clindamycin. Discontinue clindamycin. Benadryl 25-50 every 8 when necessary. Consider steroids. Contact PCP

## 2014-10-22 NOTE — Telephone Encounter (Signed)
Pt has been scheduled to see TP on 10/28/14 at 10:15am. Nothing further was needed.

## 2014-10-22 NOTE — Consult Note (Signed)
Consulting physician Dr. Hilarie Fredrickson  Reason for consult Cough with hemoptysis   HPI: 69 year old female with multiple medical problems including COPD, O2 dependent, sleep apnea, remote history of breast cancer, CAD, atrial fibrillation on anti-correlation with Eliquis, congestive heart failure, C. difficile colitis, who was recently diagnosed with cellulitis in breast and was discharged from ED on 10/14/2014. At that time patient was discharged on clindamycin, which she has been taking daily yesterday. Today patient came to the outpatient endoscopy for colonoscopy, patient underwent polypectomy. After the procedure patient had a coughing spell and coughed up  frothy pink colored phlegm. Patient denies shortness of breath, she has 24-hour home oxygen, she denies chest pain. Chest x-ray was done which showed mild pulmonary edema, no infiltrate. We were consulted to assess patient's condition for possible admission to the hospital.  She denies nausea vomiting or diarrhea. Her anti-Coblation was stopped 4 days ago in anticipation for the colonoscopy.    Allergies:   Allergies  Allergen Reactions  . Cephalexin Other (See Comments)    Exfoliative dermatitis reaction  . Codeine     REACTION: unknown - pt. states unaware of having reaction to codeine  . Lisinopril     REACTION: cough  . Penicillins     rash  . Cleocin [Clindamycin Hcl] Rash    rash      Past Medical History  Diagnosis Date  . Hypertension   . Hyperlipidemia   . Anemia   . CAD (coronary artery disease)     a. Nonobstructive CAD 2007 (EF 75%.  RCA  proximal 75% tubular, 25% prox LAD, 25% d1, 50% D2) at [facility]. b. NSTEMI 01/2010 in setting of respiratory failure, medical approach (not a good candidate for noninvasive eval)  . OSA on CPAP   . Hypothyroidism   . Depression 06/13/2011  . Neuropathy 06/13/2011  . Cervical radiculopathy 06/13/2011  . Gout 06/13/2011  . Obesity hypoventilation syndrome 06/13/2011  . COPD  (chronic obstructive pulmonary disease)     on O2  . Breast cancer dx'd 2005    No BP check or stick in Left arm  . Cellulitis     hx of cellulitis in left leg  . Impaired glucose tolerance 11/01/2012  . PFO (patent foramen ovale)   . Atrial flutter   . Atrial fibrillation   . Syncope and collapse     Bradycardia with pauses. S/P pacemaker  . Risk for falls   . Asthma 06/13/2011    hx of  . Hx of dysfunctional uterine bleeding     last bleeding jan 2016, worked up for with d and c x 2 no cause found  . History of Clostridium difficile early dec 2015    no current diarrhea  . History of home oxygen therapy     2 liters per nasal cannula all the time    Past Surgical History  Procedure Laterality Date  . Ovarian cyst removal    . Breast lumpectomy Left   . Cervical disc surgery  2004 or 2005    have cadaver bones,, screws, and plates c 5 to c 6  . Cervical biopsy  June 2013    at [facility]  Bleeding  . Dilation and curettage of uterus    . Colonoscopy    . Laparoscopic appendectomy  03/29/2012    Procedure: APPENDECTOMY LAPAROSCOPIC;  Surgeon: Adin Hector, MD;  Location: [facility];  Service: General;  Laterality: N/A;  . Tee without cardioversion N/A 03/15/2013  Procedure: TRANSESOPHAGEAL ECHOCARDIOGRAM (TEE);  Surgeon: Thayer Headings, MD;  Location: [place];  Service: Cardiovascular;  Laterality: N/A;  . Cardioversion N/A 03/15/2013    Procedure: CARDIOVERSION;  Surgeon: Thayer Headings, MD;  Location: [facility] ENDOSCOPY;  Service: Cardiovascular;  Laterality: N/A;  . Loop recorder implant  08-28-2013; 08-31-2013    MDT LinQ implanted by Dr Rayann Heman for syncope; explanted 08-31-2013 after sinus pauses identified  . Pacemaker insertion  08-31-2013    Biotronik Evera dual chamber pacemaker placed for neurocardiogenic syncope and sinus pauses by Dr Rayann Heman  . Loop recorder implant N/A 08/28/2013    Procedure: LOOP RECORDER IMPLANT;  Surgeon: Coralyn Mark, MD;  Location: [place]  CATH LAB;  Service: Cardiovascular;  Laterality: N/A;  . Permanent pacemaker insertion N/A 08/31/2013    Procedure: PERMANENT PACEMAKER INSERTION;  Surgeon: Coralyn Mark, MD;  Location: [place] CATH LAB;  Service: Cardiovascular;  Laterality: N/A;  . Loop recorder explant  08/31/2013    Procedure: LOOP RECORDER EXPLANT;  Surgeon: Coralyn Mark, MD;  Location: [place] CATH LAB;  Service: Cardiovascular;;    Prior to Admission medications   Medication Sig Start Date End Date Taking? Authorizing Provider  allopurinol (ZYLOPRIM) 300 MG tablet Take 300 mg by mouth daily.   Yes Historical Provider, MD  amitriptyline (ELAVIL) 100 MG tablet Take 100 mg by mouth at bedtime.   Yes Historical Provider, MD  apixaban (ELIQUIS) 5 MG TABS tablet Take 5 mg by mouth 2 (two) times daily.   Yes Historical Provider, MD  Cholecalciferol (VITAMIN D) 2000 UNITS tablet Take 2,000 Units by mouth at bedtime.   Yes Historical Provider, MD  clindamycin (CLEOCIN) 300 MG capsule Take 1 capsule (300 mg total) by mouth 3 (three) times daily. 10/14/14  Yes Dorie Rank, MD  DULoxetine (CYMBALTA) 30 MG capsule Take 60 mg by mouth at bedtime.   Yes Historical Provider, MD  ferrous sulfate 325 (65 FE) MG tablet Take 325 mg by mouth daily with breakfast.   Yes Historical Provider, MD  furosemide (LASIX) 40 MG tablet Take 40 mg by mouth. TAKES 1 IN THE MORING AND EVERY OTHER DAY AT NIGHT   Yes Historical Provider, MD  Lactobacillus Rhamnosus, GG, (CULTURELLE PO) Take 1 tablet by mouth daily.   Yes Historical Provider, MD  levothyroxine (SYNTHROID, LEVOTHROID) 200 MCG tablet Take 200 mcg by mouth daily before breakfast.   Yes Historical Provider, MD  metoprolol tartrate (LOPRESSOR) 25 MG tablet Take 1 tablet (25 mg total) by mouth 2 (two) times daily. 11/19/13  Yes Biagio Borg, MD  Omega-3 Fatty Acids (FISH OIL PO) Take 1 capsule by mouth daily.    Yes Historical Provider, MD  OXYGEN Inhale into the lungs. Continuous O2 @ 2LMP   Yes Historical  Provider, MD  peg 3350 powder (MOVIPREP) 100 G SOLR Take 1 kit (200 g total) by mouth once. 09/10/14  Yes Jerene Bears, MD  potassium chloride SA (K-DUR,KLOR-CON) 20 MEQ tablet Take 20 mEq by mouth 2 (two) times daily.   Yes Historical Provider, MD  pravastatin (PRAVACHOL) 40 MG tablet Take 1 tablet (40 mg total) by mouth daily. 10/13/14  Yes Lelon Perla, MD  traMADol (ULTRAM) 50 MG tablet Take 1 tablet (50 mg total) by mouth every 6 (six) hours as needed. Patient taking differently: Take 50 mg by mouth every 6 (six) hours as needed for moderate pain.  10/14/14  Yes Dorie Rank, MD  vitamin C (ASCORBIC ACID) 500 MG tablet Take  500 mg by mouth daily.    Yes Historical Provider, MD  albuterol (PROVENTIL HFA;VENTOLIN HFA) 108 (90 BASE) MCG/ACT inhaler Inhale 2 puffs into the lungs every 6 (six) hours as needed for wheezing or shortness of breath.    Historical Provider, MD  allopurinol (ZYLOPRIM) 300 MG tablet TAKE 1 TABLET DAILY Patient not taking: Reported on 10/15/2014 06/20/14   Biagio Borg, MD  amitriptyline (ELAVIL) 100 MG tablet TAKE 1 TABLET AT BEDTIME Patient not taking: Reported on 10/15/2014 06/03/14   Rowe Clack, MD  DULoxetine (CYMBALTA) 30 MG capsule TAKE 1 CAPSULE TWICE A DAY Patient not taking: Reported on 10/15/2014 07/29/14   Biagio Borg, MD  ELIQUIS 5 MG TABS tablet TAKE 1 TABLET TWICE A DAY Patient not taking: Reported on 10/15/2014 06/04/14   Biagio Borg, MD  furosemide (LASIX) 40 MG tablet TAKE 1 TABLET EVERY MORNING AND TAKE 1 TABLET EVERY OTHER NIGHT AS DIRECTED Patient not taking: Reported on 10/15/2014 05/28/14   Biagio Borg, MD  levothyroxine (SYNTHROID, LEVOTHROID) 200 MCG tablet TAKE 1 TABLET DAILY Patient not taking: Reported on 10/15/2014 06/04/14   Biagio Borg, MD  potassium chloride SA (K-DUR,KLOR-CON) 20 MEQ tablet TAKE 1 TABLET THREE TIMES A DAY Patient not taking: Reported on 10/15/2014 07/04/14   Lelon Perla, MD  sulfamethoxazole-trimethoprim (SEPTRA DS)  800-160 MG per tablet Take 1 tablet by mouth 2 (two) times daily. 10/15/14   Biagio Borg, MD    Social History:  reports that she quit smoking about 16 years ago. Her smoking use included Cigarettes. She has a 20 pack-year smoking history. She has never used smokeless tobacco. She reports that she drinks about 0.6 oz of alcohol per week. She reports that she does not use illicit drugs.  Family History  Problem Relation Age of Onset  . Uterine cancer Mother   . Heart disease Mother   . Breast cancer Sister 89  . Ovarian cancer Sister   . Diabetes Brother   . Breast cancer Maternal Aunt   . Breast cancer Cousin 62  . Ovarian cancer Sister      All the positives are listed in BOLD  Review of Systems:  HEENT: Headache, blurred vision, runny nose, sore throat Neck: Hypothyroidism, hyperthyroidism,,lymphadenopathy Chest : Shortness of breath, history of COPD, Asthma, cough Heart : Chest pain, history of coronary arterey disease, atrial fibrillation GI:  Nausea, vomiting, diarrhea, constipation, GERD GU: Dysuria, urgency, frequency of urination, hematuria Neuro: Stroke, seizures, syncope Psych: Depression, anxiety, hallucinations   Physical Exam: Blood pressure 101/78, pulse 78, temperature 98 F (36.7 C), temperature source Oral, resp. rate 18, height 5' 5"  (1.651 m), weight 156.491 kg (345 lb), SpO2 99 %. Constitutional:   Patient is a well-developed morbidly obese female  in no acute distress and cooperative with exam. Head: Normocephalic and atraumatic Mouth: Mucus membranes moist Eyes: PERRL, EOMI, conjunctivae normal Neck: Supple, No Thyromegaly Cardiovascular: RRR, S1 normal, S2 normal Pulmonary/Chest: CTAB, no wheezes, rales, or rhonchi Abdominal: Soft. Non-tender, non-distended, bowel sounds are normal, no masses, organomegaly, or guarding present.  Neurological: A&O x3, Strength is normal and symmetric bilaterally, cranial nerve II-XII are grossly intact, no focal motor  deficit, sensory intact to light touch bilaterally.  Extremities : Trace edema bilaterally in the lower extremities  LRadiological Exams on Admission: Dg Chest Port 1 View  10/22/2014   CLINICAL DATA:  Increased shortness of breath post anesthesia, with pink frothy mucous.  EXAM: PORTABLE CHEST - 1  VIEW  COMPARISON:  12/26/2013  FINDINGS: Dual-chamber pacer leads from left subclavian approach are in stable position. There is stable mild cardiomegaly and aortic tortuosity. Diffuse interstitial coarsening which favors mild pulmonary edema. No effusion or air leak.  IMPRESSION: Cardiomegaly and mild pulmonary edema.   Electronically Signed   By: Jorje Guild M.D.   On: 10/22/2014 12:00       Assessment/Plan Active Problems:   Hx of colonic polyps   Benign neoplasm of cecum   Benign neoplasm of transverse colon  Hemoptysis Mild pulmonary edema History of COPD  Patient had this episode after the procedure after she had received IV fluids, likely exacerbated patient's heart failure causing mild pulmonary edema. At this time patient's coughing has subsided. She denies any chest pain or shortness of breath. Patient has continuous home oxygen  I offered the patient to be admitted for observation for IV Lasix, but patient has refused admission to the hospital and wants to go home. She takes Lasix at home, and has been recommended to take Lasix when she goes back home. I also called and discussed with her pulmonologist Dr. Kara Mead who will make an outpatient appointment to see her in the office in next few days. Patient's chest x-ray does not show any infiltrate, patient has not been febrile. She recently completed 6 days of clindamycin which was prescribed on 10/14/2014 for breast cellulitis.   Would also recommend to hold Eliquis for 1 more day, and restart on 10/24/2014.  Explained to the patient that she needs to call PCP or pulmonologist, if she continues to have hemoptysis or develops  worsening shortness of breath.  Family discussion: Discussed with patient's daughter at bedside   Time Spent on Admission: 60 minutes  [facility] Pager: [phone] 10/22/2014, 1:28 PM  If 7PM-7AM, please contact night-coverage  [email or web address]  Password [email or web address]

## 2014-10-22 NOTE — Transfer of Care (Signed)
Immediate Anesthesia Transfer of Care Note  Patient: [name] [name] Number  Procedure(s) Performed: Procedure(s): COLONOSCOPY WITH PROPOFOL (N/A)  Patient Location: PACU  Anesthesia Type:MAC  Level of Consciousness: awake, sedated and patient cooperative  Airway & Oxygen Therapy: Patient Spontanous Breathing and Patient connected to face mask oxygen  Post-op Assessment: Report given to RN and Post -op Vital signs reviewed and stable  Post vital signs: stable  Last Vitals:  Filed Vitals:   10/22/14 0951  BP: 134/56  Pulse: 79  Temp: 36.7 C  Resp: 20    Complications: No apparent anesthesia complications  To get a  Chest Xray  To r/o CHF .continues to have frothy bloody secretions. Ventolin tx to be given

## 2014-10-22 NOTE — Op Note (Signed)
[facility] [place], [address]   COLONOSCOPY PROCEDURE REPORT  PATIENT: [name] [name], [name] [name]  MR#: [id number] BIRTHDATE: 23-Jun-1946 , 68  yrs. old GENDER: female ENDOSCOPIST: Jerene Bears, MD REFERRED AY:TKZSW John, M.D. PROCEDURE DATE:  10/22/2014 PROCEDURE:   Colonoscopy with snare polypectomy and Colonoscopy with cold biopsy polypectomy First Screening Colonoscopy - Avg.  risk and is 50 yrs.  old or older - No.  Prior Negative Screening - Now for repeat screening. N/A  History of Adenoma - Now for follow-up colonoscopy & has been > or = to 3 yrs.  Yes hx of adenoma.  Has been 3 or more years since last colonoscopy.  Polyps Removed Today? Yes. ASA CLASS:   Class III INDICATIONS:surveillance colonoscopy based on a history of adenomatous colonic polyp(s) and last colonoscopy completed 5 years ago at [facility], Dr. Donna Christen. MEDICATIONS: Monitored anesthesia care and Per Anesthesia  DESCRIPTION OF PROCEDURE:   After the risks benefits and alternatives of the procedure were thoroughly explained, informed consent was obtained.  The digital rectal exam revealed no rectal mass.   The Pentax Adult Colonoscope [id number]  endoscope was introduced through the anus and advanced to the cecum, which was identified by both the appendix and ileocecal valve. No adverse events experienced.   The quality of the prep was good, using MoviPrep  The instrument was then slowly withdrawn as the colon was fully examined.  COLON FINDINGS: A sessile polyp measuring 2 mm in size was found at the cecum.  A polypectomy was performed with cold forceps.  The resection was complete, the polyp tissue was completely retrieved and sent to histology.   A sessile polyp measuring 5 mm in size was found in the transverse colon.  A polypectomy was performed with a cold snare.  The resection was complete, the polyp tissue was completely retrieved and sent to histology.   There was  mild diverticulosis noted in the transverse colon, descending colon, and sigmoid colon.  Retroflexed views revealed internal hemorrhoids. The time to cecum=3 minutes 00 seconds.  Withdrawal time=12 minutes 00 seconds.  The scope was withdrawn and the procedure completed. COMPLICATIONS: There were no immediate complications.  ENDOSCOPIC IMPRESSION: 1.   Sessile polyp was found at the cecum; polypectomy was performed with cold forceps 2.   Sessile polyp was found in the transverse colon; polypectomy was performed with a cold snare 3.   Mild diverticulosis was noted in the transverse colon, descending colon, and sigmoid colon  RECOMMENDATIONS: 1.  Await pathology results 2.  High fiber diet 3.  Timing of repeat colonoscopy will be determined by pathology findings. 4.  You will receive a letter within 1-2 weeks with the results of your biopsy as well as final recommendations.  Please call my office if you have not received a letter after 3 weeks.  eSigned:  Jerene Bears, MD 10/22/2014 11:21 AM    cc: Biagio Borg, MD and The Patient

## 2014-10-22 NOTE — Discharge Instructions (Signed)
Okay to resume Eliquis tomorrow, 10/23/2014  YOU HAD AN ENDOSCOPIC PROCEDURE TODAY: Refer to the procedure report that was given to you for any specific questions about what was found during the examination.  If the procedure report does not answer your questions, please call your gastroenterologist to clarify.  YOU SHOULD EXPECT: Some feelings of bloating in the abdomen. Passage of more gas than usual.  Walking can help get rid of the air that was put into your GI tract during the procedure and reduce the bloating. If you had a lower endoscopy (such as a colonoscopy or flexible sigmoidoscopy) you may notice spotting of blood in your stool or on the toilet paper.   DIET: Your first meal following the procedure should be a light meal and then it is ok to progress to your normal diet.  A half-sandwich or bowl of soup is an example of a good first meal.  Heavy or fried foods are harder to digest and may make you feel nasueas or bloated.  Drink plenty of fluids but you should avoid alcoholic beverages for 24 hours.  ACTIVITY: Your care partner should take you home directly after the procedure.  You should plan to take it easy, moving slowly for the rest of the day.  You can resume normal activity the day after the procedure however you should NOT DRIVE or use heavy machinery for 24 hours (because of the sedation medicines used during the test).    SYMPTOMS TO REPORT IMMEDIATELY  A gastroenterologist can be reached at any hour.  Please call your doctor's office for any of the following symptoms:   Following lower endoscopy (colonoscopy, flexible sigmoidoscopy)  Excessive amounts of blood in the stool  Significant tenderness, worsening of abdominal pains  Swelling of the abdomen that is new, acute  Fever of 100 or higher  Following upper endoscopy (EGD, EUS, ERCP)  Vomiting of blood or coffee ground material  New, significant abdominal pain  New, significant chest pain or pain under the shoulder  blades  Painful or persistently difficult swallowing  New shortness of breath  Black, tarry-looking stools  FOLLOW UP: If any biopsies were taken you will be contacted by phone or by letter within the next 1-3 weeks.  Call your gastroenterologist if you have not heard about the biopsies in 3 weeks.  Please also call your gastroenterologist's office with any specific questions about appointments or follow up tests.

## 2014-10-22 NOTE — Anesthesia Preprocedure Evaluation (Addendum)
Anesthesia Evaluation  Patient identified by MRN, date of birth, ID band Patient awake    Reviewed: Allergy & Precautions, H&P , NPO status , Patient's Chart, lab work & pertinent test results, reviewed documented beta blocker date and time   Airway Mallampati: II  TM Distance: >3 FB Neck ROM: Full    Dental no notable dental hx.    Pulmonary neg pulmonary ROS, asthma , sleep apnea, Continuous Positive Airway Pressure Ventilation and Oxygen sleep apnea , COPD COPD inhaler, former smoker,  Oxygen 2l/min 24 hours a day. breath sounds clear to auscultation  Pulmonary exam normal       Cardiovascular hypertension, Pt. on medications and Pt. on home beta blockers + CAD, + Past MI and +CHF + pacemaker Rhythm:Regular Rate:Normal  No recent interrogation pacer interrogation on chart. Contacted biot[employer] rep. She stated that the device was interrogated this AM on home monitoring system and there is normal device function, 21% atrial paced.   Echo 02/2013 - Left ventricle: Systolic function was normal. The estimated ejection fraction was in the range of 55% to 60%. - Aortic valve: No evidence of vegetation. No evidence of vegetation. - Left atrium: No evidence of thrombus in the atrial cavity or appendage. - Atrial septum: There was a patent foramen ovale. - Tricuspid valve: Mild-moderate regurgitation.    Neuro/Psych PSYCHIATRIC DISORDERS Depression  Neuromuscular disease    GI/Hepatic negative GI ROS, Neg liver ROS,   Endo/Other  Hypothyroidism Morbid obesity  Renal/GU negative Renal ROS     Musculoskeletal negative musculoskeletal ROS (+)   Abdominal   Peds  Hematology negative hematology ROS (+) anemia ,   Anesthesia Other Findings   Reproductive/Obstetrics negative OB ROS                           Anesthesia Physical  Anesthesia Plan  ASA: IV  Anesthesia Plan: MAC   Post-op Pain  Management:    Induction: Intravenous  Airway Management Planned:   Additional Equipment:   Intra-op Plan:   Post-operative Plan:   Informed Consent: I have reviewed the patients History and Physical, chart, labs and discussed the procedure including the risks, benefits and alternatives for the proposed anesthesia with the patient or authorized representative who has indicated his/her understanding and acceptance.   Dental advisory given  Plan Discussed with: CRNA  Anesthesia Plan Comments:         Anesthesia Quick Evaluation

## 2014-10-22 NOTE — Anesthesia Postprocedure Evaluation (Signed)
Anesthesia Post Note  Patient: [name] [name]  Procedure(s) Performed: Procedure(s) (LRB): COLONOSCOPY WITH PROPOFOL (N/A)  Anesthesia type: MAC  Patient location: PACU  Post pain: Pain level controlled  Post assessment: Post-op Vital signs reviewed  Last Vitals: BP 101/78 mmHg  Pulse 78  Temp(Src) 36.7 C (Oral)  Resp 18  Ht 5' 5"  (1.651 m)  Wt 345 lb (156.491 kg)  BMI 57.41 kg/m2  SpO2 99%  Post vital signs: Reviewed  Level of consciousness: awake  Complications: No apparent anesthesia complications

## 2014-10-23 ENCOUNTER — Telehealth: Payer: Self-pay | Admitting: *Deleted

## 2014-10-23 ENCOUNTER — Encounter: Payer: Self-pay | Admitting: Internal Medicine

## 2014-10-23 ENCOUNTER — Encounter (HOSPITAL_COMMUNITY): Payer: Self-pay | Admitting: Internal Medicine

## 2014-10-23 MED ORDER — METHYLPREDNISOLONE 4 MG PO KIT: PACK | ORAL | Status: DC

## 2014-10-23 MED ORDER — SULFAMETHOXAZOLE-TRIMETHOPRIM 800-160 MG PO TABS: 1.0000 | ORAL_TABLET | Freq: Two times a day (BID) | ORAL | Status: DC

## 2014-10-23 NOTE — Telephone Encounter (Signed)
Yes, discontinue clindamycin due to rash Bactrim DS 1 tab BID x 5 days Medrol dose pac x 1 Followup with PCP

## 2014-10-23 NOTE — Telephone Encounter (Signed)
Dr Pyrtle Please advise  

## 2014-10-23 NOTE — Telephone Encounter (Signed)
Patient notified that medications have been sent.

## 2014-10-23 NOTE — Telephone Encounter (Signed)
Jerene Bears, MD at 10/23/2014 9:55 AM     Status: Signed       Expand All Collapse All   Yes, discontinue clindamycin due to rash Bactrim DS 1 tab BID x 5 days Medrol dose pac x 1 Followup with PCP

## 2014-10-23 NOTE — Telephone Encounter (Signed)
Sent instructions to [facility]

## 2014-10-25 ENCOUNTER — Encounter: Payer: Self-pay | Admitting: Internal Medicine

## 2014-10-25 ENCOUNTER — Ambulatory Visit (INDEPENDENT_AMBULATORY_CARE_PROVIDER_SITE_OTHER): Payer: Medicare Other | Admitting: Internal Medicine

## 2014-10-25 VITALS — BP 144/90 | HR 104 | Temp 98.3°F | Ht 66.0 in | Wt 340.0 lb

## 2014-10-25 DIAGNOSIS — N61 Inflammatory disorders of breast: Secondary | ICD-10-CM

## 2014-10-25 DIAGNOSIS — I10 Essential (primary) hypertension: Secondary | ICD-10-CM | POA: Diagnosis not present

## 2014-10-25 DIAGNOSIS — R21 Rash and other nonspecific skin eruption: Secondary | ICD-10-CM | POA: Diagnosis not present

## 2014-10-25 NOTE — Assessment & Plan Note (Signed)
Improved, to finish prednisone asd, no further cleocin, ok for benadryl cream topical to shoulders as well where has most of the itching to avoid further cellulitis

## 2014-10-25 NOTE — Progress Notes (Signed)
Subjective:    Patient ID: [name] [name] Number, female    DOB: Jan 14, 1946, 69 y.o.   MRN: [id number]  HPI  Here with 2 wks symptoms, woke up one day depressed with being snowed in, later in the day felt sick , next day went to ER with left breast cellultiis, tx with cleocin, next 1-2 days with new rash wit hpeeling to shoulders, and itching, kept taking cleocin but advised to stop feb 2 per GI.  Changed to bactrim for cellultiis - 1 wk course, also predpack for allergy.  Colonscopy with 2 small polyps o/w no acute.  Did have some cough with ? Blood when lying on side with procedure, she declined stay in hosp as obs, went home. Itching improved somewhat but still significant to shoulders.  Left breast improved with no further induration., red/tender resolved.  Mentions still having some mild vaginal bleeding but all biopsy neg for malignancy, so not offered tx such as TAH.  No current diarrhea, currently taking the probiotic per Dr Hilarie Fredrickson for one month daily, then one wk per month after that.  Bp at hoome usually < 140/90 Past Medical History  Diagnosis Date  . Hypertension   . Hyperlipidemia   . Anemia   . CAD (coronary artery disease)     a. Nonobstructive CAD 2007 (EF 75%.  RCA  proximal 75% tubular, 25% prox LAD, 25% d1, 50% D2) at [facility]. b. NSTEMI 01/2010 in setting of respiratory failure, medical approach (not a good candidate for noninvasive eval)  . OSA on CPAP   . Hypothyroidism   . Depression 06/13/2011  . Neuropathy 06/13/2011  . Cervical radiculopathy 06/13/2011  . Gout 06/13/2011  . Obesity hypoventilation syndrome 06/13/2011  . COPD (chronic obstructive pulmonary disease)     on O2  . Breast cancer dx'd 2005    No BP check or stick in Left arm  . Cellulitis     hx of cellulitis in left leg  . Impaired glucose tolerance 11/01/2012  . PFO (patent foramen ovale)   . Atrial flutter   . Atrial fibrillation   . Syncope and collapse     Bradycardia with pauses. S/P pacemaker  . Risk for  falls   . Asthma 06/13/2011    hx of  . Hx of dysfunctional uterine bleeding     last bleeding jan 2016, worked up for with d and c x 2 no cause found  . History of Clostridium difficile early dec 2015    no current diarrhea  . History of home oxygen therapy     2 liters per nasal cannula all the time   Past Surgical History  Procedure Laterality Date  . Ovarian cyst removal    . Breast lumpectomy Left   . Cervical disc surgery  2004 or 2005    have cadaver bones,, screws, and plates c 5 to c 6  . Cervical biopsy  June 2013    at [facility] for Heavy  Bleeding  . Dilation and curettage of uterus    . Colonoscopy    . Laparoscopic appendectomy  03/29/2012    Procedure: APPENDECTOMY LAPAROSCOPIC;  Surgeon: Adin Hector, MD;  Location: [facility];  Service: General;  Laterality: N/A;  . Tee without cardioversion N/A 03/15/2013    Procedure: TRANSESOPHAGEAL ECHOCARDIOGRAM (TEE);  Surgeon: Thayer Headings, MD;  Location: [place];  Service: Cardiovascular;  Laterality: N/A;  . Cardioversion N/A 03/15/2013    Procedure: CARDIOVERSION;  Surgeon: Thayer Headings, MD;  Location: [facility] ENDOS[facility];  Service: Cardiovascular;  Laterality: N/A;  . Loop recorder implant  08-28-2013; 08-31-2013    MDT LinQ implanted by Dr Rayann Heman for syncope; explanted 08-31-2013 after sinus pauses identified  . Pacemaker insertion  08-31-2013    Biotronik Evera dual chamber pacemaker placed for neurocardiogenic syncope and sinus pauses by Dr Rayann Heman  . Loop recorder implant N/A 08/28/2013    Procedure: LOOP RECORDER IMPLANT;  Surgeon: Coralyn Mark, MD;  Location: [place] CATH LAB;  Service: Cardiovascular;  Laterality: N/A;  . Permanent pacemaker insertion N/A 08/31/2013    Procedure: PERMANENT PACEMAKER INSERTION;  Surgeon: Coralyn Mark, MD;  Location: [place] CATH LAB;  Service: Cardiovascular;  Laterality: N/A;  . Loop recorder explant  08/31/2013    Procedure: LOOP RECORDER EXPLANT;  Surgeon: Coralyn Mark, MD;  Location: [place]  CATH LAB;  Service: Cardiovascular;;  . Colonoscopy with propofol N/A 10/22/2014    Procedure: COLONOSCOPY WITH PROPOFOL;  Surgeon: Jerene Bears, MD;  [facility];  Service: Gastroenterology;  Laterality: N/A;    reports that she quit smoking about 16 years ago. Her smoking use included Cigarettes. She has a 20 pack-year smoking history. She has never used smokeless tobacco. She reports that she drinks about 0.6 oz of alcohol per week. She reports that she does not use illicit drugs. family history includes Breast cancer in her maternal aunt; Breast cancer (age of onset: 68) in her sister; Breast cancer (age of onset: 54) in her cousin; Diabetes in her brother; Heart disease in her mother; Ovarian cancer in her sister and sister; Uterine cancer in her mother. Allergies  Allergen Reactions  . Cephalexin Other (See Comments)    Exfoliative dermatitis reaction  . Codeine     REACTION: unknown - pt. states unaware of having reaction to codeine  . Lisinopril     REACTION: cough  . Penicillins     rash  . Cleocin [Clindamycin Hcl] Rash    rash   Current Outpatient Prescriptions on File Prior to Visit  Medication Sig Dispense Refill  . albuterol (PROVENTIL HFA;VENTOLIN HFA) 108 (90 BASE) MCG/ACT inhaler Inhale 2 puffs into the lungs every 6 (six) hours as needed for wheezing or shortness of breath.    . allopurinol (ZYLOPRIM) 300 MG tablet Take 300 mg by mouth daily.    [name] amitriptyline (ELAVIL) 100 MG tablet Take 100 mg by mouth at bedtime.    [name] apixaban (ELIQUIS) 5 MG TABS tablet Take 5 mg by mouth 2 (two) times daily.    . Cholecalciferol (VITAMIN D) 2000 UNITS tablet Take 2,000 Units by mouth at bedtime.    . DULoxetine (CYMBALTA) 30 MG capsule Take 60 mg by mouth at bedtime.    [name] ELIQUIS 5 MG TABS tablet TAKE 1 TABLET TWICE A DAY 180 tablet 2  . ferrous sulfate 325 (65 FE) MG tablet Take 325 mg by mouth daily with breakfast.    . furosemide (LASIX) 40 MG tablet Take 40 mg by mouth.  TAKES 1 IN THE MORING AND EVERY OTHER DAY AT NIGHT    . Lactobacillus Rhamnosus, GG, (CULTURELLE PO) Take 1 tablet by mouth daily.    [name] levothyroxine (SYNTHROID, LEVOTHROID) 200 MCG tablet Take 200 mcg by mouth daily before breakfast.    . methylPREDNISolone (MEDROL DOSEPAK) 4 MG tablet follow package directions 21 tablet 0  . metoprolol tartrate (LOPRESSOR) 25 MG tablet Take 1 tablet (25 mg total) by mouth 2 (two) times daily. 180 tablet 3  .  Omega-3 Fatty Acids (FISH OIL PO) Take 1 capsule by mouth daily.     . OXYGEN Inhale into the lungs. Continuous O2 @ 2LMP    . peg 3350 powder (MOVIPREP) 100 G SOLR Take 1 kit (200 g total) by mouth once. 1 kit 0  . potassium chloride SA (K-DUR,KLOR-CON) 20 MEQ tablet Take 20 mEq by mouth 2 (two) times daily.    . pravastatin (PRAVACHOL) 40 MG tablet Take 1 tablet (40 mg total) by mouth daily. 90 tablet 1  . sulfamethoxazole-trimethoprim (SEPTRA DS) 800-160 MG per tablet Take 1 tablet by mouth 2 (two) times daily. 20 tablet 0  . sulfamethoxazole-trimethoprim (SEPTRA DS) 800-160 MG per tablet Take 1 tablet by mouth 2 (two) times daily. 10 tablet 0  . traMADol (ULTRAM) 50 MG tablet Take 1 tablet (50 mg total) by mouth every 6 (six) hours as needed. (Patient taking differently: Take 50 mg by mouth every 6 (six) hours as needed for moderate pain. ) 15 tablet 0  . vitamin C (ASCORBIC ACID) 500 MG tablet Take 500 mg by mouth daily.      No current facility-administered medications on file prior to visit.   Review of Systems  Constitutional: Negative for unusual diaphoresis or other sweats  HENT: Negative for ringing in ear Eyes: Negative for double vision or worsening visual disturbance.  Respiratory: Negative for choking and stridor.   Gastrointestinal: Negative for vomiting or other signifcant bowel change Genitourinary: Negative for hematuria or decreased urine volume.  Musculoskeletal: Negative for other MSK pain or swelling Skin: Negative for color  change and worsening wound.  Neurological: Negative for tremors and numbness other than noted  Psychiatric/Behavioral: Negative for decreased concentration or agitation other than above       Objective:   Physical Exam BP 144/90 mmHg  Pulse 104  Temp(Src) 98.3 F (36.8 C) (Oral)  Ht 5' 6"  (1.676 m)  Wt 340 lb (154.223 kg)  BMI 54.90 kg/m2  SpO2 95% VS noted,  Constitutional: Pt appears well-developed, well-nourished.  HENT: Head: NCAT.  Right Ear: External ear normal.  Left Ear: External ear normal.  Eyes: . Pupils are equal, round, and reactive to light. Conjunctivae and EOM are normal Neck: Normal range of motion. Neck supple.  Cardiovascular: Normal rate and regular rhythm.   Pulmonary/Chest: Effort normal and breath sounds without rales or wheezing.  Abd:  Soft, NT, ND, + BS Neurological: Pt is alert. Not confused , motor grossly intact Skin: Skin is warm. No rash except some peelling to shoulders, left breast with some scaliness, no erythema/tender/swelling Psychiatric: Pt behavior is normal. No agitation.  BP Readings from Last 3 Encounters:  10/25/14 144/90  10/22/14 101/78  10/14/14 122/64      Assessment & Plan:

## 2014-10-25 NOTE — Progress Notes (Signed)
Pre visit review using our clinic review tool, if applicable. No additional management support is needed unless otherwise documented below in the visit note. 

## 2014-10-25 NOTE — Patient Instructions (Addendum)
Please continue all other medications as before, and refills have been done if requested.  Please have the pharmacy call with any other refills you may need.  Please continue your efforts at being more active, low cholesterol diet, and weight control.  Please keep your appointments with your specialists as you may have planned     

## 2014-10-25 NOTE — Assessment & Plan Note (Signed)
Likely reactive, ok to folow for now - stable overall by history and exam, recent data reviewed with pt, and pt to continue medical treatment as before,  to f/u any worsening symptoms or concerns BP Readings from Last 3 Encounters:  10/25/14 144/90  10/22/14 101/78  10/14/14 122/64

## 2014-10-25 NOTE — Assessment & Plan Note (Signed)
Clinically improved , to finish bactrim asd

## 2014-10-28 ENCOUNTER — Ambulatory Visit (INDEPENDENT_AMBULATORY_CARE_PROVIDER_SITE_OTHER)
Admission: RE | Admit: 2014-10-28 | Discharge: 2014-10-28 | Disposition: A | Discharge status: Home / Self Care | Payer: Medicare Other | Source: Ambulatory Visit | Attending: Adult Health | Admitting: Adult Health

## 2014-10-28 ENCOUNTER — Ambulatory Visit (INDEPENDENT_AMBULATORY_CARE_PROVIDER_SITE_OTHER): Payer: Medicare Other | Admitting: Adult Health

## 2014-10-28 ENCOUNTER — Encounter: Payer: Self-pay | Admitting: Adult Health

## 2014-10-28 VITALS — BP 122/76 | HR 77 | Temp 97.8°F | Ht 65.0 in | Wt 333.6 lb

## 2014-10-28 DIAGNOSIS — I517 Cardiomegaly: Secondary | ICD-10-CM | POA: Diagnosis not present

## 2014-10-28 DIAGNOSIS — J810 Acute pulmonary edema: Secondary | ICD-10-CM

## 2014-10-28 DIAGNOSIS — J9611 Chronic respiratory failure with hypoxia: Secondary | ICD-10-CM | POA: Diagnosis not present

## 2014-10-28 DIAGNOSIS — J9610 Chronic respiratory failure, unspecified whether with hypoxia or hypercapnia: Secondary | ICD-10-CM | POA: Insufficient documentation

## 2014-10-28 DIAGNOSIS — J449 Chronic obstructive pulmonary disease, unspecified: Secondary | ICD-10-CM

## 2014-10-28 DIAGNOSIS — R042 Hemoptysis: Secondary | ICD-10-CM | POA: Diagnosis not present

## 2014-10-28 NOTE — Assessment & Plan Note (Signed)
Mild pulmonary edema. Status post colonoscopy. Appears resolved Patient appears clinically stable without acute decompensation We'll check chest x-ray today.

## 2014-10-28 NOTE — Progress Notes (Signed)
  Subjective:    Patient ID: [name] [name], female    DOB: 1945/10/20, 69 y.o.   MRN: [id number]  HPI PCP - Jenny Reichmann   69 year old morbidly obese female ex smoker (quit 2000) for FU of copd & OSA on 10 cm.  PMH -sleep apnea, hypertension,breast cancer with left lumpectomy and hypothyroidism   She was admitted 5/26-02/19/10 with left lower extremity pain and swelling, course complicated by acute hypercarbic resp failure requiring BiPAP after receiving dilaudid.  Duplex neg DVT, CT angio neg PE, echo >> nml LV fn, mod LVH.  She was discharged on CPAP 10 , full face mask with 2-3 L O2 (APRIA). She smoked a PPD x 30 yrs before quitting in 2004.  PFTs showed moderate airway obstruction FEV1 48%, no BD response, mild restriction, DLCO corrects for alveolar volume c/w obesity  PSG 03/30/10 surprisingly showed AHI 0.7/h & RDI 9/h, 36 RERAs over 249 mins TST, lowest desatn 92% on 2 L [place]  July 2011 -ONO showed significant desaturation for > 4h   02/12/2011 Annual FU for recertification of O2  spiriva caused coughing   06/18/2011  C/o episodic dyspnea lasting few mins - goes away if she calms down & takes deep breaths  Off ativan now     Syncope , fall - hospitalised , 5s pauses -documented sinus slowing and sinus arrest, BB stopped seen by dr Lia Foyer & EP  Acute appendecitis CXR 7/13 mild edema patter Cough improved with prilosec C/o bipedal edema - back on diuretic Synthroid back down to 200 mcg daily Compliant with CPAP, not needing rescue inhaler, able to stay off O2 at rest Cellulitis & gout ongoing issues - uses walker  Did not desaturate on 2L O2 but HR increased to 120 s/o deconditioning   10/28/2014 Follow up ER visit  Patient recently had a colonoscopy. Postprocedure patient had a coughing episode with pink frothy sputum. Chest x-ray showed some mild pulmonary edema with no acute infiltrate. She was recommended for hospitalization. However, patient declined Lactic acid was less than 2,  hemoglobin was normal, white count was slightly elevated at 13,000. O2 sats were adequate on her chronic O2. Since procedure. Patient says she has felt fine with no increased shortness of breath. She denies cough, hemoptysis, chest pain, orthopnea, PND, or increased leg swelling. Says that her chronic leg swelling has been well controlled and weight is down several pounds since her last visit here greater than 2 years ago She says she is very compliant with her C Pap wears every night and with naps She denies daytime sleepiness. No confusion     Review of Systems neg for any significant sore throat, dysphagia, itching, sneezing, nasal congestion or excess/ purulent secretions, fever, chills, sweats, unintended wt loss, pleuritic or exertional cp, hempoptysis, orthopnea pnd or change in chronic leg swelling. Also denies presyncope, palpitations, heartburn, abdominal pain, nausea, vomiting, diarrhea or change in bowel or urinary habits, dysuria,hematuria, rash, arthralgias, visual complaints, headache, numbness weakness or ataxia.     Objective:   Physical Exam  Gen. Pleasant, morbidly obese, in no distress, walks with a walker ENT - no lesions, no post nasal drip Neck: No JVD, no thyromegaly, no carotid bruits Lungs: no use of accessory muscles, no dullness to percussion, decreased without rales or rhonchi  Cardiovascular: Rhythm regular, heart sounds  normal, no murmurs or gallops, trace peripheral edema Musculoskeletal: No deformities, no cyanosis or clubbing , no tremors       Assessment & Plan:

## 2014-10-28 NOTE — Patient Instructions (Signed)
Continue on CPAP At bedtime  And with naps.  Continue on weight loss.  Wear Oxygen 2l/m . Chest xray today .  follow up Dr. Elsworth Soho  In 6 months and As needed

## 2014-10-28 NOTE — Progress Notes (Signed)
Reviewed & agree with plan  

## 2014-10-28 NOTE — Assessment & Plan Note (Signed)
Compensated without flare Patient has been intolerant to inhalers in the past

## 2014-10-28 NOTE — Assessment & Plan Note (Signed)
Compensated on oxygen Continue on 2 L

## 2014-10-29 ENCOUNTER — Telehealth: Payer: Self-pay | Admitting: Pulmonary Disease

## 2014-10-29 ENCOUNTER — Telehealth: Payer: Self-pay | Admitting: Internal Medicine

## 2014-10-29 NOTE — Telephone Encounter (Signed)
Notes Recorded by Melvenia Needles, NP on 10/28/2014 at 3:05 PM cxr is improved  Cont w/ ov recs  Please contact office for sooner follow up if symptoms do not improve or worsen or seek emergency care   Spoke with pt and notified of results per Rexene Edison, NP. Pt verbalized understanding and denied any questions.

## 2014-10-29 NOTE — Telephone Encounter (Signed)
emmi emailed °

## 2014-10-29 NOTE — Progress Notes (Signed)
Quick Note:  Pt notified of results and she verbalized understanding ______

## 2014-12-06 NOTE — Progress Notes (Signed)
HPI: fu atrial flutter. Also with hx of CAD, HTN, HL, OSA, hypothyroidism, COPD on O2, L breast CA. LHC at [facility] 10/2005: pRCA 75%, LM < 25%, pLAD 25%, D1 25%, D2 50%. Carotid Dopplers June 2013 showed no significant stenosis. She suffered a NSTEMI in 01/2010 in the setting of SIRS from leg cellulitis. This was felt to be demand ischemia. Med Rx was pursued (felt to be a poor candidate for noninvasive imaging). Echo 02/2012: EF 55-60%, mild AI, MAC, mod LAE, PASP mildly increased. Patient had atrial flutter with RVR 6/14. She was felt to be a poor candidate for atrial flutter ablation. TEE guided cardioversion was performed. She had restoration of NSR. TEE 03/15/13: EF 55-60%, no LA clot, positive PFO, mild to moderate TR. Patient admitted in December of 2014 following a syncopal episode. She had an implantable loop placed which demonstrated symptomatic bradycardia with pauses and ultimately had a pacemaker placed. Since last seen, she does have dyspnea on exertion unchanged. No orthopnea, PND, pedal edema, chest pain, palpitations or bleeding.  Current Outpatient Prescriptions  Medication Sig Dispense Refill  . albuterol (PROVENTIL HFA;VENTOLIN HFA) 108 (90 BASE) MCG/ACT inhaler Inhale 2 puffs into the lungs every 6 (six) hours as needed for wheezing or shortness of breath.    . allopurinol (ZYLOPRIM) 300 MG tablet Take 300 mg by mouth daily.    [name] amitriptyline (ELAVIL) 100 MG tablet Take 100 mg by mouth at bedtime.    [name] apixaban (ELIQUIS) 5 MG TABS tablet Take 5 mg by mouth 2 (two) times daily.    . Cholecalciferol (VITAMIN D) 2000 UNITS tablet Take 2,000 Units by mouth at bedtime.    . DULoxetine (CYMBALTA) 30 MG capsule Take 60 mg by mouth at bedtime.    . ferrous sulfate 325 (65 FE) MG tablet Take 325 mg by mouth daily with breakfast.    . furosemide (LASIX) 40 MG tablet Take 40 mg by mouth. TAKES 1 IN THE MORING AND EVERY OTHER DAY AT NIGHT    . Lactobacillus Rhamnosus, GG, (CULTURELLE PO)  Take 1 tablet by mouth daily.    [name] levothyroxine (SYNTHROID, LEVOTHROID) 200 MCG tablet Take 200 mcg by mouth daily before breakfast.    . metoprolol tartrate (LOPRESSOR) 25 MG tablet Take 1 tablet (25 mg total) by mouth 2 (two) times daily. 180 tablet 3  . Omega-3 Fatty Acids (FISH OIL PO) Take 1 capsule by mouth daily.     . OXYGEN Inhale into the lungs. Continuous O2 @ 2LMP    . potassium chloride SA (K-DUR,KLOR-CON) 20 MEQ tablet Take 20 mEq by mouth 2 (two) times daily.    . pravastatin (PRAVACHOL) 40 MG tablet Take 1 tablet (40 mg total) by mouth daily. 90 tablet 1  . vitamin C (ASCORBIC ACID) 500 MG tablet Take 500 mg by mouth daily.      No current facility-administered medications for this visit.     Past Medical History  Diagnosis Date  . Hypertension   . Hyperlipidemia   . Anemia   . CAD (coronary artery disease)     a. Nonobstructive CAD 2007 (EF 75%.  RCA  proximal 75% tubular, 25% prox LAD, 25% d1, 50% D2) at [facility]. b. NSTEMI 01/2010 in setting of respiratory failure, medical approach (not a good candidate for noninvasive eval)  . OSA on CPAP   . Hypothyroidism   . Depression 06/13/2011  . Neuropathy 06/13/2011  . Cervical radiculopathy 06/13/2011  . Gout 06/13/2011  .  Obesity hypoventilation syndrome 06/13/2011  . COPD (chronic obstructive pulmonary disease)     on O2  . Breast cancer dx'd 2005    No BP check or stick in Left arm  . Cellulitis     hx of cellulitis in left leg  . Impaired glucose tolerance 11/01/2012  . PFO (patent foramen ovale)   . Atrial flutter   . Atrial fibrillation   . Syncope and collapse     Bradycardia with pauses. S/P pacemaker  . Risk for falls   . Asthma 06/13/2011    hx of  . Hx of dysfunctional uterine bleeding     last bleeding jan 2016, worked up for with d and c x 2 no cause found  . History of Clostridium difficile early dec 2015    no current diarrhea  . History of home oxygen therapy     2 liters per nasal cannula all the time      Past Surgical History  Procedure Laterality Date  . Ovarian cyst removal    . Breast lumpectomy Left   . Cervical disc surgery  2004 or 2005    have cadaver bones,, screws, and plates c 5 to c 6  . Cervical biopsy  June 2013    at [facility] for Heavy  Bleeding  . Dilation and curettage of uterus    . Colonoscopy    . Laparoscopic appendectomy  03/29/2012    Procedure: APPENDECTOMY LAPAROSCOPIC;  Surgeon: Adin Hector, MD;  Location: [facility];  Service: General;  Laterality: N/A;  . Tee without cardioversion N/A 03/15/2013    Procedure: TRANSESOPHAGEAL ECHOCARDIOGRAM (TEE);  Surgeon: Thayer Headings, MD;  Location: [place];  Service: Cardiovascular;  Laterality: N/A;  . Cardioversion N/A 03/15/2013    Procedure: CARDIOVERSION;  Surgeon: Thayer Headings, MD;  Location: [facility] ENDOSCOPY;  Service: Cardiovascular;  Laterality: N/A;  . Loop recorder implant  08-28-2013; 08-31-2013    MDT LinQ implanted by Dr Rayann Heman for syncope; explanted 08-31-2013 after sinus pauses identified  . Pacemaker insertion  08-31-2013    Biotronik Evera dual chamber pacemaker placed for neurocardiogenic syncope and sinus pauses by Dr Rayann Heman  . Loop recorder implant N/A 08/28/2013    Procedure: LOOP RECORDER IMPLANT;  Surgeon: Coralyn Mark, MD;  Location: [place] CATH LAB;  Service: Cardiovascular;  Laterality: N/A;  . Permanent pacemaker insertion N/A 08/31/2013    Procedure: PERMANENT PACEMAKER INSERTION;  Surgeon: Coralyn Mark, MD;  Location: [place] CATH LAB;  Service: Cardiovascular;  Laterality: N/A;  . Loop recorder explant  08/31/2013    Procedure: LOOP RECORDER EXPLANT;  Surgeon: Coralyn Mark, MD;  Location: [place] CATH LAB;  Service: Cardiovascular;;  . Colonoscopy with propofol N/A 10/22/2014    Procedure: COLONOSCOPY WITH PROPOFOL;  Surgeon: Jerene Bears, MD;  [facility];  Service: Gastroenterology;  Laterality: N/A;    History   Social History  . Marital Status: Divorced    Spouse Name: N/A  .  Number of Children: 2  . Years of Education: N/A   Occupational History  . Retired    Social History Main Topics  . Smoking status: Former Smoker -- 1.00 packs/day for 20 years    Types: Cigarettes    Quit date: 09/20/1998  . Smokeless tobacco: Never Used     Comment: 1ppd x 20 years  . Alcohol Use: 0.6 oz/week    1 Glasses of wine per week     Comment: rare  . Drug Use:  No  . Sexual Activity: Not Currently    Birth Control/ Protection: Post-menopausal   Other Topics Concern  . Not on file   Social History Narrative   Lives alone in an apartment.    ROS: no fevers or chills, productive cough, hemoptysis, dysphasia, odynophagia, melena, hematochezia, dysuria, hematuria, rash, seizure activity, orthopnea, PND, pedal edema, claudication. Remaining systems are negative.  Physical Exam: Well-developed obese in no acute distress.  Skin is warm and dry.  HEENT is normal.  Neck is supple.  Chest is clear to auscultation with normal expansion.  Cardiovascular exam is regular rate and rhythm.  Abdominal exam nontender or distended. No masses palpated. Extremities show trace edema. neuro grossly intact  ECG sinus rhythm at a rate of 95. No ST changes.

## 2014-12-09 ENCOUNTER — Encounter: Payer: Self-pay | Admitting: Cardiology

## 2014-12-09 ENCOUNTER — Ambulatory Visit (INDEPENDENT_AMBULATORY_CARE_PROVIDER_SITE_OTHER): Payer: Medicare Other | Admitting: Cardiology

## 2014-12-09 VITALS — BP 110/60 | HR 95 | Ht 65.0 in | Wt 337.5 lb

## 2014-12-09 DIAGNOSIS — I480 Paroxysmal atrial fibrillation: Secondary | ICD-10-CM

## 2014-12-09 DIAGNOSIS — Z6841 Body Mass Index (BMI) 40.0 and over, adult: Secondary | ICD-10-CM | POA: Diagnosis not present

## 2014-12-09 DIAGNOSIS — I10 Essential (primary) hypertension: Secondary | ICD-10-CM

## 2014-12-09 DIAGNOSIS — I2510 Atherosclerotic heart disease of native coronary artery without angina pectoris: Secondary | ICD-10-CM | POA: Diagnosis not present

## 2014-12-09 DIAGNOSIS — I5031 Acute diastolic (congestive) heart failure: Secondary | ICD-10-CM

## 2014-12-09 DIAGNOSIS — I483 Typical atrial flutter: Secondary | ICD-10-CM

## 2014-12-09 DIAGNOSIS — Z950 Presence of cardiac pacemaker: Secondary | ICD-10-CM | POA: Diagnosis not present

## 2014-12-09 NOTE — Assessment & Plan Note (Signed)
Discussed weight loss.

## 2014-12-09 NOTE — Assessment & Plan Note (Signed)
euvolemic on examination. Continue present dose of Lasix.

## 2014-12-09 NOTE — Assessment & Plan Note (Signed)
Continue statin. 

## 2014-12-09 NOTE — Assessment & Plan Note (Signed)
Continue statin. Not on aspirin given need for anticoagulation.

## 2014-12-09 NOTE — Assessment & Plan Note (Signed)
Patient remains in sinus rhythm. Continue beta blocker. Continue apixaban. She will need hemoglobin and renal function checked every 6 months.

## 2014-12-09 NOTE — Assessment & Plan Note (Signed)
Followed by electrophysiology. 

## 2014-12-09 NOTE — Patient Instructions (Addendum)
Your physician wants you to follow-up in: [place] will receive a reminder letter in the mail two months in advance. If you don't receive a letter, please call our office to schedule the follow-up appointment.   SCHEDULE FOLLOW UP ONE YEAR APPOINTMENT WITH DR Cristopher Peru

## 2014-12-09 NOTE — Assessment & Plan Note (Signed)
Blood pressure controlled. Continue present medications. 

## 2014-12-19 ENCOUNTER — Encounter: Payer: Self-pay | Admitting: Internal Medicine

## 2014-12-20 MED ORDER — METOPROLOL TARTRATE 25 MG PO TABS: 25.0000 mg | ORAL_TABLET | Freq: Two times a day (BID) | ORAL | Status: DC

## 2014-12-20 MED ORDER — AMITRIPTYLINE HCL 100 MG PO TABS: 100.0000 mg | ORAL_TABLET | Freq: Every evening | ORAL | Status: DC | PRN

## 2015-01-11 ENCOUNTER — Other Ambulatory Visit: Payer: Self-pay | Admitting: Internal Medicine

## 2015-02-21 ENCOUNTER — Encounter: Payer: Self-pay | Admitting: *Deleted

## 2015-02-27 ENCOUNTER — Ambulatory Visit (HOSPITAL_BASED_OUTPATIENT_CLINIC_OR_DEPARTMENT_OTHER): Payer: Medicare Other | Admitting: Hematology

## 2015-02-27 ENCOUNTER — Telehealth: Payer: Self-pay | Admitting: *Deleted

## 2015-02-27 ENCOUNTER — Encounter: Payer: Self-pay | Admitting: Hematology

## 2015-02-27 ENCOUNTER — Other Ambulatory Visit: Payer: Self-pay | Admitting: Internal Medicine

## 2015-02-27 VITALS — BP 141/71 | HR 102 | Temp 97.8°F | Resp 22 | Ht 65.0 in | Wt 319.9 lb

## 2015-02-27 DIAGNOSIS — C50919 Malignant neoplasm of unspecified site of unspecified female breast: Secondary | ICD-10-CM

## 2015-02-27 DIAGNOSIS — Z853 Personal history of malignant neoplasm of breast: Secondary | ICD-10-CM

## 2015-02-27 DIAGNOSIS — L02421 Furuncle of right axilla: Secondary | ICD-10-CM

## 2015-02-27 DIAGNOSIS — L02422 Furuncle of left axilla: Secondary | ICD-10-CM

## 2015-02-27 NOTE — Telephone Encounter (Signed)
Pt called stating that she has found a lump under her arm which is concerning and she does not have a MD here.  Looked up her information and she was scheduled to see covering provider in Oct.  She has seen Dr. Ralene Ok in the past for breast primary.  I have confirmed pt to see Dr. Burr Medico today 6/9 and called Dr. Ernestina Penna nurse to make her aware.

## 2015-02-27 NOTE — Progress Notes (Signed)
[place] OFFICE PROGRESS NOTE   [name], MD [place] [id number]  DIAGNOSIS: No diagnosis found.  Chief Complaint  Patient presents with  . Follow-up    CURRENT THERAPY: Surveillance  INTERVAL HISTORY:  NKENGE SONNTAG 69 y.o. female with history of infiltrating ductal carcinoma of the left breast (August 2006) who received all of her care for her breast cancer at [facility] is here for followup.  She was last seen on 06/26/2014 by Dr. Lona Kettle.  She was scheduled to see me in 6 months, but requested an earlier appointment due to her concern of a few new skin nodules. She noticed a lump at her left axilla and a nodule in the right breast a few weeks ago, no pain or arm swelling. She sought it could be a boil, but wasn't sure.   She has chronic arm and shoulder pain, she still has mild and stable neuropathy. She has been trying to loose weight, she uses a walker, goes to gym for exercise in pool three time a week.      MEDICAL HISTORY: Past Medical History  Diagnosis Date  . Hypertension   . Hyperlipidemia   . Anemia   . CAD (coronary artery disease)     a. Nonobstructive CAD 2007 (EF 75%.  RCA  proximal 75% tubular, 25% prox LAD, 25% d1, 50% D2) at [facility]. b. NSTEMI 01/2010 in setting of respiratory failure, medical approach (not a good candidate for noninvasive eval)  . OSA on CPAP   . Hypothyroidism   . Depression 06/13/2011  . Neuropathy 06/13/2011  . Cervical radiculopathy 06/13/2011  . Gout 06/13/2011  . Obesity hypoventilation syndrome 06/13/2011  . COPD (chronic obstructive pulmonary disease)     on O2  . Breast cancer dx'd 2005    No BP check or stick in Left arm  . Cellulitis     hx of cellulitis in left leg  . Impaired glucose tolerance 11/01/2012  . PFO (patent foramen ovale)   . Atrial flutter   . Atrial fibrillation   . Syncope and collapse     Bradycardia with pauses. S/P pacemaker  . Risk for falls   . Asthma 06/13/2011     hx of  . Hx of dysfunctional uterine bleeding     last bleeding jan 2016, worked up for with d and c x 2 no cause found  . History of Clostridium difficile early dec 2015    no current diarrhea  . History of home oxygen therapy     2 liters per nasal cannula all the time    INTERIM HISTORY: has HYPOTHYROIDISM; HYPERLIPIDEMIA; Essential hypertension; ACUT MI SUBENDOCARDIAL INFARCT SUBSQT EPIS CARE; Coronary atherosclerosis; COPD (chronic obstructive pulmonary disease); OBSTRUCTIVE SLEEP APNEA; OXYGEN-USE OF SUPPLEMENTAL; Preventative health care; Breast cancer; Asthma; Depression; Colon polyps; Neuropathy; Cervical radiculopathy; Gout; Obesity hypoventilation syndrome; Sick sinus syndrome; Morbid obesity with BMI of 50.0-59.9, adult; Acute appendicitis; Cough; Abnormal TSH; Right lumbar radiculopathy; Impaired glucose tolerance; Abnormal liver function tests; Atrial flutter; Tachycardia; Anticoagulant long-term use; Tachy-brady syndrome; Acute diastolic CHF (congestive heart failure); PFO (patent foramen ovale); Hypokalemia; Postmenopausal vaginal bleeding; Syncope; Atrial fibrillation; Syncope and collapse; Urinary urgency; Post-menopausal bleeding; Exfoliative dermatitis; Pacemaker; Diarrhea; Abdominal pain, other specified site; RUQ pain; Lump in the abdomen; History of Clostridium difficile; Hx of colonic polyps; Benign neoplasm of cecum; Benign neoplasm of transverse colon; Acute pulmonary edema; Hemoptysis; CHF (congestive heart failure); Cellulitis of female breast; Rash and  nonspecific skin eruption; and Chronic respiratory failure on her problem list.    ALLERGIES:  is allergic to cephalexin; codeine; lisinopril; penicillins; and cleocin.  MEDICATIONS: has a current medication list which includes the following prescription(s): albuterol, allopurinol, amitriptyline, apixaban, vitamin d, ferrous sulfate, furosemide, lactobacillus rhamnosus (gg), levothyroxine, metoprolol tartrate, omega-3  fatty acids, oxygen-helium, potassium chloride sa, pravastatin, vitamin c, and duloxetine.  SURGICAL HISTORY:  Past Surgical History  Procedure Laterality Date  . Ovarian cyst removal    . Breast lumpectomy Left   . Cervical disc surgery  2004 or 2005    have cadaver bones,, screws, and plates c 5 to c 6  . Cervical biopsy  June 2013    at [facility] for Heavy  Bleeding  . Dilation and curettage of uterus    . Colonoscopy    . Laparoscopic appendectomy  03/29/2012    Procedure: APPENDECTOMY LAPAROSCOPIC;  Surgeon: Adin Hector, MD;  Location: [facility];  Service: General;  Laterality: N/A;  . Tee without cardioversion N/A 03/15/2013    Procedure: TRANSESOPHAGEAL ECHOCARDIOGRAM (TEE);  Surgeon: Thayer Headings, MD;  Location: [facility];  Service: Cardiovascular;  Laterality: N/A;  . Cardioversion N/A 03/15/2013    Procedure: CARDIOVERSION;  Surgeon: Thayer Headings, MD;  Location: [facility] ENDOSCOPY;  Service: Cardiovascular;  Laterality: N/A;  . Loop recorder implant  08-28-2013; 08-31-2013    MDT LinQ implanted by Dr Rayann Heman for syncope; explanted 08-31-2013 after sinus pauses identified  . Pacemaker insertion  08-31-2013    Biotronik Evera dual chamber pacemaker placed for neurocardiogenic syncope and sinus pauses by Dr Rayann Heman  . Loop recorder implant N/A 08/28/2013    Procedure: LOOP RECORDER IMPLANT;  Surgeon: Coralyn Mark, MD;  Location: [facility] CATH LAB;  Service: Cardiovascular;  Laterality: N/A;  . Permanent pacemaker insertion N/A 08/31/2013    Procedure: PERMANENT PACEMAKER INSERTION;  Surgeon: Coralyn Mark, MD;  Location: [facility] CATH LAB;  Service: Cardiovascular;  Laterality: N/A;  . Loop recorder explant  08/31/2013    Procedure: LOOP RECORDER EXPLANT;  Surgeon: Coralyn Mark, MD;  Location: [place] CATH LAB;  Service: Cardiovascular;;  . Colonoscopy with propofol N/A 10/22/2014    Procedure: COLONOSCOPY WITH PROPOFOL;  Surgeon: Jerene Bears, MD;  Location: [facility] ENDOSCOPY;  Service: Gastroenterology;   Laterality: N/A;    REVIEW OF SYSTEMS:   Constitutional: Denies fevers, chills or abnormal weight loss Eyes: Denies blurriness of vision Ears, nose, mouth, throat, and face: Denies mucositis or sore throat Respiratory: Denies cough, dyspnea or wheezes Cardiovascular: Denies palpitation, chest discomfort or lower extremity swelling Gastrointestinal:  Denies nausea, heartburn or change in bowel habits Skin: Denies abnormal skin rashes Lymphatics: Denies new lymphadenopathy or easy bruising Neurological:She reports numbness, tingling of her hands and feets but denies new weaknesses Behavioral/Psych: Mood is stable, no new changes  All other systems were reviewed with the patient and are negative.  PHYSICAL EXAMINATION: ECOG PERFORMANCE STATUS: 0-1  Blood pressure 141/71, pulse 102, temperature 97.8 F (36.6 C), temperature source Oral, resp. rate 22, height 5' 5"  (1.651 m), weight 319 lb 14.4 oz (145.106 kg), SpO2 100 %.  GENERAL:alert, no distress and comfortable; Morbidly obese. SKIN: skin color, texture, turgor are normal, no rashes or significant lesions EYES: normal, Conjunctiva are pink and non-injected, sclera clear OROPHARYNX:no exudate, no erythema and lips, buccal mucosa, and tongue normal  NECK: supple, thyroid normal size, non-tender, without nodularity LYMPH:  no palpable lymphadenopathy in the cervical, axillary or supraclavicular LUNGS: clear to auscultation  and percussion with normal breathing effort HEART: regular rate & rhythm and no murmurs and no chronic pitting extremity edema BREASTS: Large, right breast is benign, and pendulous.  Left breast shows the effects of radiation with skin changes but no suspicious finding suggestive of recurrence.  No axillary adenopathy. There is a 0.5-1.0cm skin lesion at the left axilla and right inner lower quadrant of breast, likely furuncle ABDOMEN:abdomen soft, non-tender and normal bowel sounds Musculoskeletal:no cyanosis of  digits and no clubbing  NEURO: alert & oriented x 3 with fluent speech, no focal motor/sensory deficits  Labs:  CBC Latest Ref Rng 10/14/2014 06/26/2014 05/31/2014  WBC 4.0 - 10.5 K/uL 13.7(H) 6.0 6.7  Hemoglobin 12.0 - 15.0 g/dL 12.7 12.9 14.1  Hematocrit 36.0 - 46.0 % 39.5 41.0 43.5  Platelets 150 - 400 K/uL 172 171 199.0    CMP Latest Ref Rng 10/14/2014 06/26/2014 05/31/2014  Glucose 70 - 99 mg/dL 105(H) 106 106(H)  BUN 6 - 23 mg/dL 11 5.8(L) 12  Creatinine 0.50 - 1.10 mg/dL 1.02 0.8 1.0  Sodium 135 - 145 mmol/L 139 142 141  Potassium 3.5 - 5.1 mmol/L 3.7 3.9 4.1  Chloride 96 - 112 mmol/L 102 - 101  CO2 19 - 32 mmol/L 29 31(H) 30  Calcium 8.4 - 10.5 mg/dL 9.3 9.8 9.7  Total Protein 6.4 - 8.3 g/dL - 7.2 8.1  Total Bilirubin 0.20 - 1.20 mg/dL - 0.39 0.5  Alkaline Phos 40 - 150 U/L - 93 104  AST 5 - 34 U/L - 39(H) 50(H)  ALT 0 - 55 U/L - 35 39(H)     RADIOGRAPHIC STUDIES: CXR 12/26/2013 CLINICAL DATA: History of breast cancer, hypoventilation syndrome  and abnormal liver function tests. Chronic shortness of breath.  EXAM: CHEST 2 VIEW COMPARISON: DG CHEST 2 VIEW dated 09/01/2013; CT ANGIO CHEST W/CM &/OR WO/CM dated 08/28/2013 FINDINGS: Trachea is midline. Heart is mildly enlarged and stable. Left subclavian pacemaker lead tips are seen in the right atrium and right ventricle. Lungs are hyperinflated but clear. No pleural fluid. IMPRESSION: Hyperinflation without acute finding.   Mm Digital Screening 05/23/2014 BI-RADS 2 EXAM.    PATHOLOGY: Diagnosis Endometrium, biopsy - DEGENERATING SECRETORY-TYPE ENDOMETRIUM. - THERE IS NO EVIDENCE OF HYPERPLASIA OR MALIGNANCY. - SEE COMMENT. Microscopic Comment This pattern can be associated with menses, irregular shedding or breakthrough bleeding associated with hormonal therapy. Clinical correlation is suggested. (JBK:caf 05/14/13) Enid Cutter MD Pathologist, Electronic Signature (Case signed 05/14/2013)  ASSESSMENT: Rich Number 69 y.o.  female with a history of No diagnosis found.  PLAN:  1. T2N0, stage IIA triple negative infiltrating ductal carcinoma of the left breast (2006).  --She is s/p AC x 4 and then weekly Taxotere completed on April, 2007.  She has received radiation treatments between April and June of 2007.   --She is now over 9 years from the time of diagnosis without evidence of disease.  Her physical exam and last mammogram in 05/2014 showed no evidence of recurrence.  -She will continue screening mammograms.    -I encouraged her to continue healthy diet and a regular exercise.  2. Furuncle at left axilla and right breast  -I recommend her use warm compression, and topical OTC antibiotics.   3. She will continue follow-up with her primary care physician for other medical issues.  Follow-up, she'll return for follow-up in 3 months as it was previously scheduled after her annual mammogram.  All questions were answered. The patient knows to call the clinic with  any problems, questions or concerns. We can certainly see the patient much sooner if necessary.  The patient was provided an after visit summary.   I spent 15 minutes counseling the patient face to face. The total time spent in the appointment was 25 minutes.    Truitt Merle  02/27/2015

## 2015-03-12 ENCOUNTER — Encounter: Payer: Self-pay | Admitting: Cardiology

## 2015-03-21 ENCOUNTER — Encounter: Payer: Self-pay | Admitting: *Deleted

## 2015-04-02 ENCOUNTER — Other Ambulatory Visit: Payer: Self-pay | Admitting: Internal Medicine

## 2015-04-08 ENCOUNTER — Other Ambulatory Visit: Payer: Self-pay | Admitting: Cardiology

## 2015-04-08 NOTE — Telephone Encounter (Signed)
Rx(s) sent to pharmacy electronically.  

## 2015-04-15 ENCOUNTER — Encounter: Payer: Self-pay | Admitting: *Deleted

## 2015-04-29 ENCOUNTER — Other Ambulatory Visit: Payer: Self-pay | Admitting: Adult Health

## 2015-04-29 ENCOUNTER — Encounter: Payer: Self-pay | Admitting: Adult Health

## 2015-04-29 ENCOUNTER — Ambulatory Visit (INDEPENDENT_AMBULATORY_CARE_PROVIDER_SITE_OTHER): Payer: Medicare Other | Admitting: Adult Health

## 2015-04-29 ENCOUNTER — Ambulatory Visit (INDEPENDENT_AMBULATORY_CARE_PROVIDER_SITE_OTHER)
Admission: RE | Admit: 2015-04-29 | Discharge: 2015-04-29 | Disposition: A | Discharge status: Home / Self Care | Payer: Medicare Other | Source: Ambulatory Visit | Attending: Adult Health | Admitting: Adult Health

## 2015-04-29 VITALS — BP 112/62 | HR 88 | Temp 98.2°F | Ht 65.0 in | Wt 308.0 lb

## 2015-04-29 DIAGNOSIS — I2510 Atherosclerotic heart disease of native coronary artery without angina pectoris: Secondary | ICD-10-CM | POA: Diagnosis not present

## 2015-04-29 DIAGNOSIS — R059 Cough, unspecified: Secondary | ICD-10-CM

## 2015-04-29 DIAGNOSIS — R042 Hemoptysis: Secondary | ICD-10-CM | POA: Diagnosis not present

## 2015-04-29 DIAGNOSIS — J449 Chronic obstructive pulmonary disease, unspecified: Secondary | ICD-10-CM

## 2015-04-29 DIAGNOSIS — R05 Cough: Secondary | ICD-10-CM

## 2015-04-29 DIAGNOSIS — G4730 Sleep apnea, unspecified: Secondary | ICD-10-CM | POA: Diagnosis not present

## 2015-04-29 NOTE — Addendum Note (Signed)
Addended by: Osa Craver on: 04/29/2015 04:19 PM   Modules accepted: Orders

## 2015-04-29 NOTE — Assessment & Plan Note (Signed)
?   Mild flare with upper airway cough - cxr with no acute process noted  Unclear if blood tinged mucus is from chronic cough on anticoagulation  No wheezing on exam. Previously no change or worse on symbicort /spiriva  Upcoming Labs pending from PCP . Will want to see CBC   Plan  Set up for CT chest  Cough Control regimen  Close follow up

## 2015-04-29 NOTE — Progress Notes (Signed)
Quick Note:  Called and spoke with pt. Reviewed results and recs. Pt is aware that PCC's will call to schedule CT and order is placed. Pt voiced understanding and had no further questions. Nothing further needed. ______

## 2015-04-29 NOTE — Assessment & Plan Note (Signed)
Cont on CPAP At bedtime  

## 2015-04-29 NOTE — Patient Instructions (Signed)
Delsym 2 tsp Twice daily for cough  Begin Zyrtec 15m At bedtime   Begin Prilosec 264mdaily before meal .  Use sips of water to soothe throat , try to not clear throat.  Stop Mints .  Chest xray today .  Continue on CPAP At bedtime   Follow up Dr. AlElsworth SohoIn 3-4 weeks and As needed   Please contact office for sooner follow up if symptoms do not improve or worsen or seek emergency care  Follow up with Primary MD next week for labs.

## 2015-04-29 NOTE — Assessment & Plan Note (Addendum)
Blood tinged mucus ? Etiology  cxr w/ no acute process  Will need to proceed with CT chest .  If CT is neg , consider ENT referral vs FOB

## 2015-04-29 NOTE — Progress Notes (Signed)
  Subjective:    Patient ID: Rich Number, female    DOB: [birth year], 69 y.o.   MRN: [id number]  HPI PCP - Jenny Reichmann   69 year old morbidly obese female ex smoker (quit 2000) for FU of copd & OSA on 10 cm.  PMH -sleep apnea, hypertension,breast cancer with left lumpectomy and hypothyroidism , dCHF , PFO    TEST :    Duplex neg DVT, CT angio neg PE, echo >> nml LV fn, mod LVH.   PFTs showed moderate airway obstruction FEV1 48%, no BD response, mild restriction, DLCO corrects for alveolar volume c/w obesity  PSG 03/30/10 surprisingly showed AHI 0.7/h & RDI 9/h, 36 RERAs over 249 mins TST, lowest desatn 92% on 2 L [place]  July 2011 -ONO showed significant desaturation for > 4h  ECHO TEE >EF ok, PFO +bubble   04/29/2015 Follow up : COPD and OSA  Pt returns for 6 month follow up .  She says she feels best she has felt in long time .  Does complain that over last few month when she coughs she sees blood mixed in her mucus.  Feels like she has something stuck in her throat, constantly dry cough and throat clearing. Feels everything is coming from her throat. Has seen this on/off for last few months .  Does not feel bad so was not worried about this .  She is on Eliquis.  Has lost 50lbs since last year-has been working hard to get weight off-feels this has helped her the most.  Hx of breast cancer. Lumpectomy on left s/p chemo/rad 2005.  Previously on symbicort but did not see any difference .  Spiriva caused her to cough .  Not on any inhalers at this time  Previous PFT showed mod airway obstruction with FEV1 48% , no BD response, mild restriction.  She denies any chest pain, orthopnea, PND, increased leg swelling, unintentional weight loss, fever or discolored mucus  Wearing CPAP At bedtime  , wears for 8hr each night.  Feels rested.  Use O2 on/off .  Wants to get labs done at PCP .      Review of Systems neg for any significant sore throat, dysphagia, itching, sneezing, nasal congestion or  excess/ purulent secretions, fever, chills, sweats, unintended wt loss, pleuritic or exertional cp, hempoptysis, orthopnea pnd or change in chronic leg swelling. Also denies presyncope, palpitations, heartburn, abdominal pain, nausea, vomiting, diarrhea or change in bowel or urinary habits, dysuria,hematuria, rash, arthralgias, visual complaints, headache, numbness weakness or ataxia.     Objective:   Physical Exam  Gen. Pleasant, morbidly obese, in no distress, walks with a pink  walker ENT - no lesions, no post nasal drip Neck: No JVD, no thyromegaly, no carotid bruits Lungs: no use of accessory muscles, no dullness to percussion, decreased without rales or rhonchi  Cardiovascular: Rhythm regular, heart sounds  normal, no murmurs or gallops, trace peripheral edema Musculoskeletal: No deformities, no cyanosis or clubbing , no tremors  CXR 04/29/2015 reviewed independently  The pulmonary vascularity is mildly prominent centrally but less conspicuous than on the previous study     Assessment & Plan:

## 2015-04-30 ENCOUNTER — Telehealth: Payer: Self-pay | Admitting: Internal Medicine

## 2015-04-30 ENCOUNTER — Ambulatory Visit (INDEPENDENT_AMBULATORY_CARE_PROVIDER_SITE_OTHER)
Admission: RE | Admit: 2015-04-30 | Discharge: 2015-04-30 | Disposition: A | Discharge status: Home / Self Care | Payer: Medicare Other | Source: Ambulatory Visit | Attending: Adult Health | Admitting: Adult Health

## 2015-04-30 ENCOUNTER — Other Ambulatory Visit (INDEPENDENT_AMBULATORY_CARE_PROVIDER_SITE_OTHER): Payer: Medicare Other

## 2015-04-30 ENCOUNTER — Telehealth: Payer: Self-pay | Admitting: Adult Health

## 2015-04-30 DIAGNOSIS — I700 Atherosclerosis of aorta: Secondary | ICD-10-CM | POA: Diagnosis not present

## 2015-04-30 DIAGNOSIS — R042 Hemoptysis: Secondary | ICD-10-CM

## 2015-04-30 DIAGNOSIS — J449 Chronic obstructive pulmonary disease, unspecified: Secondary | ICD-10-CM

## 2015-04-30 LAB — BASIC METABOLIC PANEL
BUN: 10 mg/dL (ref 6–23)
CALCIUM: 9.8 mg/dL (ref 8.4–10.5)
CHLORIDE: 103 meq/L (ref 96–112)
CO2: 29 meq/L (ref 19–32)
CREATININE: 0.88 mg/dL (ref 0.40–1.20)
GFR: 81.98 mL/min (ref >60.00)
GLUCOSE: 104 mg/dL — AB (ref 70–99)
Potassium: 3.7 mEq/L (ref 3.5–5.1)
Sodium: 143 mEq/L (ref 135–145)

## 2015-04-30 MED ORDER — IOHEXOL 350 MG/ML SOLN
80.0000 mL | Freq: Once | INTRAVENOUS | Status: AC | PRN
  Administered 2015-04-30: 80 mL via INTRAVENOUS

## 2015-04-30 NOTE — Telephone Encounter (Signed)
CT Angio neg for PE  Will forward to TP as FYI

## 2015-04-30 NOTE — Telephone Encounter (Signed)
New Message       Pt calling stating that she received a letter in regards to her device needing to be checked. Pt is wanting some clarification b/c she isn't due to see Dr. Lovena Le until next year. Please call back and advise.

## 2015-04-30 NOTE — Telephone Encounter (Signed)
According to paceart and epic pt has not been seen by MD since May 2015. Pt agreed to appt on 05-20-15 w/ MD

## 2015-05-01 NOTE — Progress Notes (Signed)
Quick Note:  Called and spoke with pt. Reviewed results and recs. Pt stated the visit with her PCP is on 05/06/15 and will keep her appointment with Dr. Elsworth Soho on 06/11/15. Pt voiced understanding and had no further questions. ______

## 2015-05-02 NOTE — Progress Notes (Signed)
Reviewed & agree with plan  

## 2015-05-06 ENCOUNTER — Encounter: Payer: Self-pay | Admitting: Internal Medicine

## 2015-05-06 ENCOUNTER — Ambulatory Visit (INDEPENDENT_AMBULATORY_CARE_PROVIDER_SITE_OTHER): Payer: Medicare Other | Admitting: Internal Medicine

## 2015-05-06 ENCOUNTER — Other Ambulatory Visit (INDEPENDENT_AMBULATORY_CARE_PROVIDER_SITE_OTHER): Payer: Medicare Other

## 2015-05-06 VITALS — BP 114/68 | HR 82 | Temp 97.5°F | Ht 65.0 in | Wt 315.0 lb

## 2015-05-06 DIAGNOSIS — R1314 Dysphagia, pharyngoesophageal phase: Secondary | ICD-10-CM | POA: Insufficient documentation

## 2015-05-06 DIAGNOSIS — R7302 Impaired glucose tolerance (oral): Secondary | ICD-10-CM | POA: Diagnosis not present

## 2015-05-06 DIAGNOSIS — I10 Essential (primary) hypertension: Secondary | ICD-10-CM

## 2015-05-06 DIAGNOSIS — K1379 Other lesions of oral mucosa: Secondary | ICD-10-CM

## 2015-05-06 DIAGNOSIS — E785 Hyperlipidemia, unspecified: Secondary | ICD-10-CM | POA: Diagnosis not present

## 2015-05-06 DIAGNOSIS — I2510 Atherosclerotic heart disease of native coronary artery without angina pectoris: Secondary | ICD-10-CM | POA: Diagnosis not present

## 2015-05-06 LAB — CBC WITH DIFFERENTIAL/PLATELET
Basophils Absolute: 0 10*3/uL (ref 0.0–0.1)
Basophils Relative: 0.3 % (ref 0.0–3.0)
EOS ABS: 0.2 10*3/uL (ref 0.0–0.7)
EOS PCT: 3.7 % (ref 0.0–5.0)
HCT: 41.7 % (ref 36.0–46.0)
HEMOGLOBIN: 13.6 g/dL (ref 12.0–15.0)
Lymphocytes Relative: 31.3 % (ref 12.0–46.0)
Lymphs Abs: 1.6 10*3/uL (ref 0.7–4.0)
MCHC: 32.6 g/dL (ref 30.0–36.0)
MCV: 89.9 fl (ref 78.0–100.0)
Monocytes Absolute: 0.4 10*3/uL (ref 0.1–1.0)
Monocytes Relative: 7.9 % (ref 3.0–12.0)
Neutro Abs: 2.9 10*3/uL (ref 1.4–7.7)
Neutrophils Relative %: 56.8 % (ref 43.0–77.0)
Platelets: 157 10*3/uL (ref 150.0–400.0)
RBC: 4.64 Mil/uL (ref 3.87–5.11)
RDW: 14.5 % (ref 11.5–15.5)
WBC: 5.1 10*3/uL (ref 4.0–10.5)

## 2015-05-06 LAB — HEPATIC FUNCTION PANEL
ALT: 22 U/L (ref 0–35)
AST: 23 U/L (ref 0–37)
Albumin: 4 g/dL (ref 3.5–5.2)
Alkaline Phosphatase: 85 U/L (ref 39–117)
Bilirubin, Direct: 0.2 mg/dL (ref 0.0–0.3)
Total Bilirubin: 0.4 mg/dL (ref 0.2–1.2)
Total Protein: 7.4 g/dL (ref 6.0–8.3)

## 2015-05-06 LAB — LIPID PANEL
Cholesterol: 172 mg/dL (ref 0–200)
HDL: 44.3 mg/dL (ref >39.00)
LDL Cholesterol: 97 mg/dL (ref 0–99)
NONHDL: 127.29
Total CHOL/HDL Ratio: 4
Triglycerides: 153 mg/dL — ABNORMAL HIGH (ref 0.0–149.0)
VLDL: 30.6 mg/dL (ref 0.0–40.0)

## 2015-05-06 LAB — HEMOGLOBIN A1C: HEMOGLOBIN A1C: 5.5 % (ref 4.6–6.5)

## 2015-05-06 LAB — TSH: TSH: 0.38 u[IU]/mL (ref 0.35–4.50)

## 2015-05-06 NOTE — Patient Instructions (Signed)
Please continue all other medications as before, and refills have been done if requested.  Please have the pharmacy call with any other refills you may need.  Please continue your efforts at being more active, low cholesterol diet, and weight control.  You are otherwise up to date with prevention measures today.  Please keep your appointments with your specialists as you may have planned - Dr Elsworth Soho in September  You will be contacted regarding the referral for: Gastroenterology  Please call at any time if you would want the referral to ENT as well  Please go to the LAB in the Basement (turn left off the elevator) for the tests to be done today  You will be contacted by phone if any changes need to be made immediately.  Otherwise, you will receive a letter about your results with an explanation, but please check with MyChart first.  Please remember to sign up for MyChart if you have not done so, as this will be important to you in the future with finding out test results, communicating by private email, and scheduling acute appointments online when needed.  Please return in 6 months, or sooner if needed

## 2015-05-06 NOTE — Progress Notes (Signed)
Subjective:    Patient ID: [name] [name], female    DOB: August 25, 1946, 69 y.o.   MRN: [id number]  HPI   Has been having some blood from mouth, source unclear except she;s concerned mostly about throat condition, first noticed with colonoscopy procedure feb 2016.  Since then with 1-2 times per month further blood in mouth; is on eliquis, has intermittent cough that may or may not be related per pt; had cxr/CTA chest recent without acute. Off Home o2.  Has appt with Dr Elsworth Soho soon, has not yet seen other such as GI or ENT. Delsym helps with cough.   Does have freq difficulty with dysphagia as well since feb 2016, when she at least started paying attention to it, states throat is dry and itchy, Does not have trouble with pills/meds, but can be with solids or liquids.  "just gets choked." like 'its going down the wrong way" No feeling of solids getting hung up going down. No Naause, vomiting, abd pain.   Wt Readings from Last 3 Encounters:  05/06/15 315 lb (142.883 kg)  04/29/15 308 lb (139.708 kg)  02/27/15 319 lb 14.4 oz (145.106 kg)  Has lost wt from 340 lbs to current 315 since feb 2016, states this is mostly intentional, especially after counseling per cardiology regarding her wt.  No nosebleeds or overt sinus congestion. No fever, HA or ST.     Also with several joints pains to fingers of the hands which bother her quite a bit.  Had fatigue recently and lower BP apparently with wt loss, and BP lower lately.  Still needs med for rate control, taking only lower dose BB now.  Seems to feel better. Taking BP at home bid often.  BP oftern 110 sbp.   Past Medical History  Diagnosis Date  . Hypertension   . Hyperlipidemia   . Anemia   . CAD (coronary artery disease)     a. Nonobstructive CAD 2007 (EF 75%.  RCA  proximal 75% tubular, 25% prox LAD, 25% d1, 50% D2) at [facility]. b. NSTEMI 01/2010 in setting of respiratory failure, medical approach (not a good candidate for noninvasive eval)  . OSA on CPAP   .  Hypothyroidism   . Depression 06/13/2011  . Neuropathy 06/13/2011  . Cervical radiculopathy 06/13/2011  . Gout 06/13/2011  . Obesity hypoventilation syndrome 06/13/2011  . COPD (chronic obstructive pulmonary disease)     on O2  . Breast cancer dx'd 2005    No BP check or stick in Left arm  . Cellulitis     hx of cellulitis in left leg  . Impaired glucose tolerance 11/01/2012  . PFO (patent foramen ovale)   . Atrial flutter   . Atrial fibrillation   . Syncope and collapse     Bradycardia with pauses. S/P pacemaker  . Risk for falls   . Asthma 06/13/2011    hx of  . Hx of dysfunctional uterine bleeding     last bleeding jan 2016, worked up for with d and c x 2 no cause found  . History of Clostridium difficile early dec 2015    no current diarrhea  . History of home oxygen therapy     2 liters per nasal cannula all the time   Past Surgical History  Procedure Laterality Date  . Ovarian cyst removal    . Breast lumpectomy Left   . Cervical disc surgery  2004 or 2005    have cadaver bones,, screws, and plates  c 5 to c 6  . Cervical biopsy  June 2013    at [facility] for [facility]  Bleeding  . Dilation and curettage of uterus    . Colonoscopy    . Laparoscopic appendectomy  03/29/2012    Procedure: APPENDECTOMY LAPAROSCOPIC;  Surgeon: Adin Hector, MD;  Location: [facility] ORS;  Service: General;  Laterality: N/A;  . Tee without cardioversion N/A 03/15/2013    Procedure: TRANSESOPHAGEAL ECHOCARDIOGRAM (TEE);  Surgeon: Thayer Headings, MD;  Location: [place];  Service: Cardiovascular;  Laterality: N/A;  . Cardioversion N/A 03/15/2013    Procedure: CARDIOVERSION;  Surgeon: Thayer Headings, MD;  Location: [facility] ENDOSCOPY;  Service: Cardiovascular;  Laterality: N/A;  . Loop recorder implant  08-28-2013; 08-31-2013    MDT LinQ implanted by Dr Rayann Heman for syncope; explanted 08-31-2013 after sinus pauses identified  . Pacemaker insertion  08-31-2013    Biotronik Evera dual chamber pacemaker placed for  neurocardiogenic syncope and sinus pauses by Dr Rayann Heman  . Loop recorder implant N/A 08/28/2013    Procedure: LOOP RECORDER IMPLANT;  Surgeon: Coralyn Mark, MD;  Location: [place] CATH LAB;  Service: Cardiovascular;  Laterality: N/A;  . Permanent pacemaker insertion N/A 08/31/2013    Procedure: PERMANENT PACEMAKER INSERTION;  Surgeon: Coralyn Mark, MD;  Location: [place] CATH LAB;  Service: Cardiovascular;  Laterality: N/A;  . Loop recorder explant  08/31/2013    Procedure: LOOP RECORDER EXPLANT;  Surgeon: Coralyn Mark, MD;  Location: [place] CATH LAB;  Service: Cardiovascular;;  . Colonoscopy with propofol N/A 10/22/2014    Procedure: COLONOSCOPY WITH PROPOFOL;  Surgeon: Jerene Bears, MD;  Location: [facility] ENDOSCOPY;  Service: Gastroenterology;  Laterality: N/A;    reports that she quit smoking about 16 years ago. Her smoking use included Cigarettes. She has a 20 pack-year smoking history. She has never used smokeless tobacco. She reports that she drinks about 0.6 oz of alcohol per week. She reports that she does not use illicit drugs. family history includes Breast cancer in her maternal aunt; Breast cancer (age of onset: 57) in her sister; Breast cancer (age of onset: 18) in her cousin; Diabetes in her brother; Heart disease in her mother; Ovarian cancer in her sister and sister; Uterine cancer in her mother. Allergies  Allergen Reactions  . Cephalexin Other (See Comments)    Exfoliative dermatitis reaction  . Codeine     REACTION: unknown - pt. states unaware of having reaction to codeine  . Lisinopril     REACTION: cough  . Penicillins     rash  . Cleocin [Clindamycin Hcl] Rash    rash     Current Outpatient Prescriptions on File Prior to Visit  Medication Sig Dispense Refill  . albuterol (PROVENTIL HFA;VENTOLIN HFA) 108 (90 BASE) MCG/ACT inhaler Inhale 2 puffs into the lungs every 6 (six) hours as needed for wheezing or shortness of breath.    . allopurinol (ZYLOPRIM) 300 MG tablet Take 300 mg by  mouth daily.    [name] amitriptyline (ELAVIL) 100 MG tablet Take 1 tablet (100 mg total) by mouth at bedtime as needed for sleep. 90 tablet 1  . apixaban (ELIQUIS) 5 MG TABS tablet Take 5 mg by mouth 2 (two) times daily.    . Cholecalciferol (VITAMIN D) 2000 UNITS tablet Take 2,000 Units by mouth at bedtime.    . DULoxetine (CYMBALTA) 30 MG capsule TAKE 1 CAPSULE TWICE A DAY 180 capsule 1  . ferrous sulfate 325 (65 FE) MG tablet Take 325  mg by mouth daily with breakfast.    . furosemide (LASIX) 40 MG tablet TAKE 1 TABLET EVERY MORNING AND 1 TABLET EVERY OTHER NIGHT AS DIRECTED 90 tablet 2  . Lactobacillus Rhamnosus, GG, (CULTURELLE PO) Take 1 tablet by mouth daily.    [name] levothyroxine (SYNTHROID, LEVOTHROID) 200 MCG tablet TAKE 1 TABLET DAILY 90 tablet 1  . metoprolol tartrate (LOPRESSOR) 25 MG tablet Take 1 tablet (25 mg total) by mouth 2 (two) times daily. (Patient taking differently: 25 mg. Take 1/2 tablet in the morning and 1 tablet at bedtime.) 180 tablet 3  . Omega-3 Fatty Acids (FISH OIL PO) Take 1 capsule by mouth daily.     . OXYGEN Inhale into the lungs. Continuous O2 @ 2LMP    . potassium chloride SA (K-DUR,KLOR-CON) 20 MEQ tablet Take 20 mEq by mouth 2 (two) times daily.    . pravastatin (PRAVACHOL) 40 MG tablet TAKE 1 TABLET DAILY 90 tablet 3  . vitamin C (ASCORBIC ACID) 500 MG tablet Take 500 mg by mouth daily.      No current facility-administered medications on file prior to visit.   Review of Systems  Constitutional: Negative for unusual diaphoresis or night sweats HENT: Negative for ringing in ear or discharge Eyes: Negative for double vision or worsening visual disturbance.  Respiratory: Negative for choking and stridor.   Gastrointestinal: Negative for vomiting or other signifcant bowel change Genitourinary: Negative for hematuria or change in urine volume.  Musculoskeletal: Negative for other MSK pain or swelling Skin: Negative for color change and worsening wound.    Neurological: Negative for tremors and numbness other than noted  Psychiatric/Behavioral: Negative for decreased concentration or agitation other than above       Objective:   Physical Exam BP 114/68 mmHg  Pulse 82  Temp(Src) 97.5 F (36.4 C) (Oral)  Ht 5' 5"  (1.651 m)  Wt 315 lb (142.883 kg)  BMI 52.42 kg/m2  SpO2 97% VS noted,  Constitutional: Pt appears in no significant distress HENT: Head: NCAT.  Right Ear: External ear normal.  Left Ear: External ear normal.  Eyes: . Pupils are equal, round, and reactive to light. Conjunctivae and EOM are normal Neck: Normal range of motion. Neck supple.  Cardiovascular: Normal rate and regular rhythm.   Pulmonary/Chest: Effort normal and breath sounds without rales or wheezing.  Abd:  Soft, NT, ND, + BS Neurological: Pt is alert. Not confused , motor grossly intact Skin: Skin is warm. No rash, no LE edema Psychiatric: Pt behavior is normal. No agitation.     Assessment & Plan:

## 2015-05-06 NOTE — Assessment & Plan Note (Signed)
stable overall by history and exam, recent data reviewed with pt, and pt to continue medical treatment as before,  to f/u any worsening symptoms or concerns Lab Results  Component Value Date   LDLCALC 77 11/13/2013   For f/u lab

## 2015-05-06 NOTE — Assessment & Plan Note (Addendum)
Mild to mod, for GI referral , may need EGD,  to f/u any worsening symptoms or concerns  Note:  Total time for pt hx, exam, review of record with pt in the room, determination of diagnoses and plan for further eval and tx is > 40 min, with over 50% spent in coordination and counseling of patient

## 2015-05-06 NOTE — Progress Notes (Signed)
Pre visit review using our clinic review tool, if applicable. No additional management support is needed unless otherwise documented below in the visit note. 

## 2015-05-06 NOTE — Assessment & Plan Note (Signed)
stable overall by history and exam, recent data reviewed with pt, and pt to continue medical treatment as before,  to f/u any worsening symptoms or concerns BP Readings from Last 3 Encounters:  05/06/15 114/68  04/29/15 112/62  02/27/15 141/71

## 2015-05-06 NOTE — Assessment & Plan Note (Signed)
With recent wt loss, for f/u a1c,  to f/u any worsening symptoms or concerns, cont wt loss efforts

## 2015-05-06 NOTE — Assessment & Plan Note (Signed)
Urged pt to see ENT, exam benign today, she declines, wants to see GI first, for cbc today on eliquis

## 2015-05-07 ENCOUNTER — Ambulatory Visit (INDEPENDENT_AMBULATORY_CARE_PROVIDER_SITE_OTHER): Payer: Medicare Other | Admitting: Internal Medicine

## 2015-05-07 ENCOUNTER — Encounter: Payer: Self-pay | Admitting: Internal Medicine

## 2015-05-07 VITALS — BP 126/72 | HR 76 | Ht 64.0 in | Wt 316.2 lb

## 2015-05-07 DIAGNOSIS — R0989 Other specified symptoms and signs involving the circulatory and respiratory systems: Secondary | ICD-10-CM

## 2015-05-07 DIAGNOSIS — K219 Gastro-esophageal reflux disease without esophagitis: Secondary | ICD-10-CM | POA: Diagnosis not present

## 2015-05-07 DIAGNOSIS — Z7901 Long term (current) use of anticoagulants: Secondary | ICD-10-CM

## 2015-05-07 DIAGNOSIS — R042 Hemoptysis: Secondary | ICD-10-CM

## 2015-05-07 DIAGNOSIS — R6889 Other general symptoms and signs: Secondary | ICD-10-CM

## 2015-05-07 DIAGNOSIS — I2510 Atherosclerotic heart disease of native coronary artery without angina pectoris: Secondary | ICD-10-CM

## 2015-05-07 DIAGNOSIS — R198 Other specified symptoms and signs involving the digestive system and abdomen: Secondary | ICD-10-CM

## 2015-05-07 MED ORDER — OMEPRAZOLE 40 MG PO CPDR: 40.0000 mg | DELAYED_RELEASE_CAPSULE | Freq: Every day | ORAL | Status: DC

## 2015-05-07 NOTE — Progress Notes (Signed)
Subjective:    Patient ID: [name], female    DOB: 1946-09-02, 69 y.o.   MRN: [id number]  HPI [name] [name] is a 69 year old female with past medical history of adenomatous colon polyps, diverticulosis, remote C. Difficile who is seen in follow-up to evaluate issues with coughing up and swallowing. She also has a history of obesity, breast cancer in remission, A. fib on Eliquis, CAD, COPD, OSA.  She had colonoscopy performed in February 2016. This revealed 2 sessile colon polyps and mild diverticulosis in the transverse and left colon. The small polyps were adenomas.  Since that time she has had trouble with hoarseness, throat clearing, and coughing. This is most noticeable after eating and drinking. On multiple occasions she's had scant hemoptysis with her cough. She denies true heartburn. There is no odynophagia. She reports it feels like food at times "wants to go the wrong way". This leads to more throat clearing. She denies true dysphagia with solids or liquids. She was started on Prilosec over-the-counter, 20 mg, 4 days ago.  She has been able to lose 40 pounds by dietary modification. With this she is completely off home oxygen. She is feeling better overall. She reports this was after a conversation with Dr. Stanford Breed. She had been told to lose weight before but feels like it was divine intervention after her last meeting with him it just hit her that she needed to make this lifestyle change.  Of note she has a history of prior C5-C6 reconstruction  Review of Systems As per history of present illness, otherwise negative  Current Medications, Allergies, Past Medical History, Past Surgical History, Family History and Social History were reviewed in [employer] record.     Objective:   Physical Exam BP 126/72 mmHg  Pulse 76  Ht 5' 4"  (1.626 m)  Wt 316 lb 4 oz (143.45 kg)  BMI 54.26 kg/m2 Constitutional: Well-developed and well-nourished. No  distress. HEENT: Normocephalic and atraumatic. Oropharynx is clear and moist. No oropharyngeal exudate. Conjunctivae are normal.  No scleral icterus. Neck: Neck supple. Trachea midline. Cardiovascular: Normal rate, regular rhythm  Pulmonary/chest: Effort normal and breath sounds normal. No wheezing, rales or rhonchi. Abdominal: Soft, nontender, nondistended. Bowel sounds active throughout. Extremities: no clubbing, cyanosis, trace bilateral lower extremity edema Neurological: Alert and oriented to person place and time. Skin: Skin is warm and dry. Psychiatric: Normal mood and affect. Behavior is normal.     CLINICAL DATA:  Intermittent hemoptysis since colonoscopy.   EXAM: CT ANGIOGRAPHY CHEST WITH CONTRAST   TECHNIQUE: Multidetector CT imaging of the chest was performed using the standard protocol during bolus administration of intravenous contrast. Multiplanar CT image reconstructions and MIPs were obtained to evaluate the vascular anatomy.   CONTRAST:  1m OMNIPAQUE IOHEXOL 350 MG/ML SOLN   COMPARISON:  08/28/2013   FINDINGS: Mediastinum: There is moderate cardiac enlargement identified. Aortic atherosclerosis noted. There are also calcifications within the LAD Coronary artery. The trachea appears patent and is midline. Normal appearance of the esophagus. No mediastinal or hilar adenopathy. No abnormal filling defects identified within the main pulmonary artery or its branches to suggest an acute pulmonary embolus.   Lungs/Pleura: No pleural effusion. No suspicious pulmonary nodule or mass identified.   Upper Abdomen: The visualized portions of the liver and spleen are normal. The adrenal glands are unremarkable. Ventral abdominal wall hernia is noted which contains fat, image 94 of series 4.   Musculoskeletal: There is degenerative disc disease noted within the thoracic  spine. No aggressive lytic or sclerotic bone lesions identified.   Review of the MIP images  confirms the above findings.   IMPRESSION: 1. Examination is negative for acute pulmonary embolus. 2. Aortic atherosclerosis.     Electronically Signed   By: Kerby Moors M.D.   On: 04/30/2015 10:23  CBC    Component Value Date/Time   WBC 5.1 05/06/2015 1114   WBC 6.0 06/26/2014 0819   RBC 4.64 05/06/2015 1114   RBC 4.40 06/26/2014 0819   HGB 13.6 05/06/2015 1114   HGB 12.9 06/26/2014 0819   HCT 41.7 05/06/2015 1114   HCT 41.0 06/26/2014 0819   PLT 157.0 05/06/2015 1114   PLT 171 06/26/2014 0819   MCV 89.9 05/06/2015 1114   MCV 93.0 06/26/2014 0819   MCH 30.2 10/14/2014 1321   MCH 29.4 06/26/2014 0819   MCHC 32.6 05/06/2015 1114   MCHC 31.6 06/26/2014 0819   RDW 14.5 05/06/2015 1114   RDW 14.5 06/26/2014 0819   LYMPHSABS 1.6 05/06/2015 1114   LYMPHSABS 2.2 06/26/2014 0819   MONOABS 0.4 05/06/2015 1114   MONOABS 0.5 06/26/2014 0819   EOSABS 0.2 05/06/2015 1114   EOSABS 0.2 06/26/2014 0819   BASOSABS 0.0 05/06/2015 1114   BASOSABS 0.0 06/26/2014 0819    CMP     Component Value Date/Time   NA 143 04/30/2015 0736   NA 142 06/26/2014 0819   K 3.7 04/30/2015 0736   K 3.9 06/26/2014 0819   CL 103 04/30/2015 0736   CL 97* 12/28/2012 0930   CO2 29 04/30/2015 0736   CO2 31* 06/26/2014 0819   GLUCOSE 104* 04/30/2015 0736   GLUCOSE 106 06/26/2014 0819   GLUCOSE 111* 12/28/2012 0930   BUN 10 04/30/2015 0736   BUN 5.8* 06/26/2014 0819   CREATININE 0.88 04/30/2015 0736   CREATININE 0.8 06/26/2014 0819   CALCIUM 9.8 04/30/2015 0736   CALCIUM 9.8 06/26/2014 0819   PROT 7.4 05/06/2015 1114   PROT 7.2 06/26/2014 0819   ALBUMIN 4.0 05/06/2015 1114   ALBUMIN 3.4* 06/26/2014 0819   AST 23 05/06/2015 1114   AST 39* 06/26/2014 0819   ALT 22 05/06/2015 1114   ALT 35 06/26/2014 0819   ALKPHOS 85 05/06/2015 1114   ALKPHOS 93 06/26/2014 0819   BILITOT 0.4 05/06/2015 1114   BILITOT 0.39 06/26/2014 0819   GFRNONAA 55* 10/14/2014 1321   GFRAA 64* 10/14/2014 1321       Assessment & Plan:  69 year old female with past medical history of adenomatous colon polyps, diverticulosis, remote C. Difficile who is seen in follow-up to evaluate issues with coughing up and swallowing  1. GERD/scant hemoptysis -- she is having atypical reflux symptoms particularly cough, hoarseness and throat clearing. This is most likely secondary to reflux disease. She is having some issues with swallowing though it is not classic solid food dysphagia. Her weight loss has been intentional. I agree completely with PPI trial and would recommend prescription dose of omeprazole. Increase omeprazole to 40 mg once daily, 30 minutes before breakfast. Attire reflux precautions. I recommended barium esophagram with tablet to evaluate esophageal motility and rule out stricture or other obstructive lesions. It barium swallow abnormal she may need upper endoscopy and if so this would need to be in the outpatient hospital setting due to her BMI. Chronic anticoagulation would need to be held under direction of Dr. Stanford Breed.  2. History of adenomatous colon polyps -- repeat colonoscopy recommended February 2021  30 minutes spent with patient today

## 2015-05-07 NOTE — Patient Instructions (Signed)
You have been scheduled for a follow up with Dr Hilarie Fredrickson on Wednesday 07/16/15 at 9:30 am.  We have sent the following medications to your pharmacy for you to pick up at your convenience: Omeprazole 40 mg daily (increase from 20 mg daily)  You have been scheduled for a Barium Esophogram at [facility] (1st floor of the hospital) on 05/12/15 Monday at 11:00 am. Please arrive 15 minutes prior to your appointment for registration. Make certain not to have anything to eat or drink 6 hours prior to your test. If you need to reschedule for any reason, please contact radiology at [phone] to do so. __________________________________________________________________ A barium swallow is an examination that concentrates on views of the esophagus. This tends to be a double contrast exam (barium and two liquids which, when combined, create a gas to distend the wall of the oesophagus) or single contrast (non-ionic iodine based). The study is usually tailored to your symptoms so a good history is essential. Attention is paid during the study to the form, structure and configuration of the esophagus, looking for functional disorders (such as aspiration, dysphagia, achalasia, motility and reflux) EXAMINATION You may be asked to change into a gown, depending on the type of swallow being performed. A radiologist and radiographer will perform the procedure. The radiologist will advise you of the type of contrast selected for your procedure and direct you during the exam. You will be asked to stand, sit or lie in several different positions and to hold a small amount of fluid in your mouth before being asked to swallow while the imaging is performed .In some instances you may be asked to swallow barium coated marshmallows to assess the motility of a solid food bolus. The exam can be recorded as a digital or video fluoroscopy procedure. POST PROCEDURE It will take 1-2 days for the barium to pass through your  system. To facilitate this, it is important, unless otherwise directed, to increase your fluids for the next 24-48hrs and to resume your normal diet.  This test typically takes about 30 minutes to perform. __________________________________________________________________________________

## 2015-05-09 ENCOUNTER — Encounter: Payer: Self-pay | Admitting: Adult Health

## 2015-05-09 DIAGNOSIS — J449 Chronic obstructive pulmonary disease, unspecified: Secondary | ICD-10-CM

## 2015-05-09 NOTE — Telephone Encounter (Signed)
Pt is wanting to d/c her O2 and have [employer] pick up their O2 equipment.  Please see e-mail and advise on order. Thanks.  RA-please advise thanks.

## 2015-05-12 ENCOUNTER — Ambulatory Visit (HOSPITAL_COMMUNITY)
Admission: RE | Admit: 2015-05-12 | Discharge: 2015-05-12 | Disposition: A | Discharge status: Home / Self Care | Payer: Medicare Other | Source: Ambulatory Visit | Attending: Internal Medicine | Admitting: Internal Medicine

## 2015-05-12 DIAGNOSIS — R490 Dysphonia: Secondary | ICD-10-CM | POA: Diagnosis present

## 2015-05-12 DIAGNOSIS — K224 Dyskinesia of esophagus: Secondary | ICD-10-CM | POA: Insufficient documentation

## 2015-05-12 DIAGNOSIS — K219 Gastro-esophageal reflux disease without esophagitis: Secondary | ICD-10-CM

## 2015-05-12 NOTE — Telephone Encounter (Signed)
Okay to discontinue oxygen We'll reassess need for CPAP on next visit

## 2015-05-20 ENCOUNTER — Encounter: Payer: Self-pay | Admitting: Internal Medicine

## 2015-05-20 ENCOUNTER — Ambulatory Visit (INDEPENDENT_AMBULATORY_CARE_PROVIDER_SITE_OTHER): Payer: Medicare Other | Admitting: Internal Medicine

## 2015-05-20 VITALS — BP 124/72 | HR 94 | Ht 65.5 in | Wt 315.2 lb

## 2015-05-20 DIAGNOSIS — I2510 Atherosclerotic heart disease of native coronary artery without angina pectoris: Secondary | ICD-10-CM

## 2015-05-20 DIAGNOSIS — I10 Essential (primary) hypertension: Secondary | ICD-10-CM | POA: Diagnosis not present

## 2015-05-20 DIAGNOSIS — Z950 Presence of cardiac pacemaker: Secondary | ICD-10-CM | POA: Diagnosis not present

## 2015-05-20 DIAGNOSIS — I480 Paroxysmal atrial fibrillation: Secondary | ICD-10-CM | POA: Diagnosis not present

## 2015-05-20 LAB — CUP PACEART INCLINIC DEVICE CHECK
Brady Statistic RV Percent Paced: 0 %
Lead Channel Impedance Value: 604 Ohm
Lead Channel Impedance Value: 975 Ohm
Lead Channel Pacing Threshold Amplitude: 1.1 V
Lead Channel Pacing Threshold Amplitude: 1.2 V
Lead Channel Pacing Threshold Pulse Width: 0.4 ms
Lead Channel Sensing Intrinsic Amplitude: 11.1 mV
Lead Channel Sensing Intrinsic Amplitude: 4 mV
Lead Channel Setting Pacing Amplitude: 2 V
Lead Channel Setting Pacing Pulse Width: 0.75 ms
MDC IDC MSMT LEADCHNL RV PACING THRESHOLD PULSEWIDTH: 0.75 ms
MDC IDC PG MODEL: 359529
MDC IDC SESS DTM: 20160830133443
MDC IDC SET LEADCHNL RV PACING AMPLITUDE: 2.6 V
MDC IDC STAT BRADY RA PERCENT PACED: 20 %
Pulse Gen Serial Number: 68181424

## 2015-05-20 NOTE — Assessment & Plan Note (Signed)
Her Biotronik DDD PM is working normally. Will recheck in several months.

## 2015-05-20 NOTE — Assessment & Plan Note (Signed)
She is maintaining NSR. No change in meds.

## 2015-05-20 NOTE — Patient Instructions (Signed)
Medication Instructions:  Your physician recommends that you continue on your current medications as directed. Please refer to the Current Medication list given to you today.   Labwork: None ordered  Testing/Procedures: None ordered  Follow-Up: Your physician wants you to follow-up in: 12 months with Dr Knox Saliva will receive a reminder letter in the mail two months in advance. If you don't receive a letter, please call our office to schedule the follow-up appointment.  Remote monitoring is used to monitor your Pacemaker  from home. This monitoring reduces the number of office visits required to check your device to one time per year. It allows [place] to keep an eye on the functioning of your device to ensure it is working properly. You are scheduled for a device check from home on 08/19/15. You may send your transmission at any time that day. If you have a wireless device, the transmission will be sent automatically. After your physician reviews your transmission, you will receive a postcard with your next transmission date.    Any Other Special Instructions Will Be Listed Below (If Applicable).

## 2015-05-20 NOTE — Progress Notes (Signed)
HPI [name] [name] returns today for followup. She is a pleasant obese 69 yo woman with a h/o syncope, subsequently found to be due to sinus node dysfunction, s/p PPM insertion. In the interim she developed an allergy to cephalosporins and sloughed skin. She has recovered. She has increased her physical activity and has lost weight. No syncope. She has peripheral edema but this is improved and she has gotten off of her oxygen.  Allergies  Allergen Reactions  . Cephalexin Other (See Comments)    Exfoliative dermatitis reaction  . Codeine     REACTION: unknown - pt. states unaware of having reaction to codeine  . Lisinopril     REACTION: cough  . Penicillins     rash  . Cleocin [Clindamycin Hcl] Rash    rash     Current Outpatient Prescriptions  Medication Sig Dispense Refill  . albuterol (PROVENTIL HFA;VENTOLIN HFA) 108 (90 BASE) MCG/ACT inhaler Inhale 2 puffs into the lungs every 6 (six) hours as needed for wheezing or shortness of breath.    . allopurinol (ZYLOPRIM) 300 MG tablet Take 300 mg by mouth daily.    [name] amitriptyline (ELAVIL) 100 MG tablet Take 1 tablet (100 mg total) by mouth at bedtime as needed for sleep. 90 tablet 1  . apixaban (ELIQUIS) 5 MG TABS tablet Take 5 mg by mouth 2 (two) times daily.    . Cholecalciferol (VITAMIN D) 2000 UNITS tablet Take 2,000 Units by mouth at bedtime.    . DULoxetine (CYMBALTA) 30 MG capsule TAKE 1 CAPSULE TWICE A DAY 180 capsule 1  . ferrous sulfate 325 (65 FE) MG tablet Take 325 mg by mouth daily with breakfast.    . furosemide (LASIX) 40 MG tablet TAKE 1 TABLET EVERY MORNING AND 1 TABLET EVERY OTHER NIGHT AS DIRECTED 90 tablet 2  . levothyroxine (SYNTHROID, LEVOTHROID) 200 MCG tablet TAKE 1 TABLET DAILY 90 tablet 1  . metoprolol tartrate (LOPRESSOR) 25 MG tablet Take 25 mg by mouth 2 (two) times daily.    . Omega-3 Fatty Acids (FISH OIL PO) Take 1 capsule by mouth daily.     [name] omeprazole (PRILOSEC) 40 MG capsule Take 1 capsule (40 mg  total) by mouth daily. 30 capsule 2  . OXYGEN Inhale into the lungs. Continuous O2 @ 2LMP    . potassium chloride SA (K-DUR,KLOR-CON) 20 MEQ tablet Take 20 mEq by mouth 2 (two) times daily.    . pravastatin (PRAVACHOL) 40 MG tablet TAKE 1 TABLET DAILY 90 tablet 3  . vitamin C (ASCORBIC ACID) 500 MG tablet Take 500 mg by mouth daily.      No current facility-administered medications for this visit.     Past Medical History  Diagnosis Date  . Hypertension   . Hyperlipidemia   . Anemia   . CAD (coronary artery disease)     a. Nonobstructive CAD 2007 (EF 75%.  RCA  proximal 75% tubular, 25% prox LAD, 25% d1, 50% D2) at [facility]. b. NSTEMI 01/2010 in setting of respiratory failure, medical approach (not a good candidate for noninvasive eval)  . OSA on CPAP   . Hypothyroidism   . Depression 06/13/2011  . Neuropathy 06/13/2011  . Cervical radiculopathy 06/13/2011  . Gout 06/13/2011  . Obesity hypoventilation syndrome 06/13/2011  . COPD (chronic obstructive pulmonary disease)     on O2  . Breast cancer dx'd 2005    No BP check or stick in Left arm  . Cellulitis  hx of cellulitis in left leg  . Impaired glucose tolerance 11/01/2012  . PFO (patent foramen ovale)   . Atrial flutter   . Atrial fibrillation   . Syncope and collapse     Bradycardia with pauses. S/P pacemaker  . Risk for falls   . Asthma 06/13/2011    hx of  . Hx of dysfunctional uterine bleeding     last bleeding jan 2016, worked up for with d and c x 2 no cause found  . History of Clostridium difficile early dec 2015    no current diarrhea  . History of home oxygen therapy     2 liters per nasal cannula all the time    ROS:   All systems reviewed and negative except as noted in the HPI.   Past Surgical History  Procedure Laterality Date  . Ovarian cyst removal    . Breast lumpectomy Left   . Cervical disc surgery  2004 or 2005    have cadaver bones,, screws, and plates c 5 to c 6  . Cervical biopsy  June 2013     at [facility] for Heavy  Bleeding  . Dilation and curettage of uterus    . Colonoscopy    . Laparoscopic appendectomy  03/29/2012    Procedure: APPENDECTOMY LAPAROSCOPIC;  Surgeon: Adin Hector, MD;  Location: [facility];  Service: General;  Laterality: N/A;  . Tee without cardioversion N/A 03/15/2013    Procedure: TRANSESOPHAGEAL ECHOCARDIOGRAM (TEE);  Surgeon: Thayer Headings, MD;  Location: [place];  Service: Cardiovascular;  Laterality: N/A;  . Cardioversion N/A 03/15/2013    Procedure: CARDIOVERSION;  Surgeon: Thayer Headings, MD;  Location: [facility] ENDOSCOPY;  Service: Cardiovascular;  Laterality: N/A;  . Loop recorder implant  08-28-2013; 08-31-2013    MDT LinQ implanted by Dr Rayann Heman for syncope; explanted 08-31-2013 after sinus pauses identified  . Pacemaker insertion  08-31-2013    Biotronik Evera dual chamber pacemaker placed for neurocardiogenic syncope and sinus pauses by Dr Rayann Heman  . Loop recorder implant N/A 08/28/2013    Procedure: LOOP RECORDER IMPLANT;  Surgeon: Coralyn Mark, MD;  Location: [place] CATH LAB;  Service: Cardiovascular;  Laterality: N/A;  . Permanent pacemaker insertion N/A 08/31/2013    Procedure: PERMANENT PACEMAKER INSERTION;  Surgeon: Coralyn Mark, MD;  Location: [place] CATH LAB;  Service: Cardiovascular;  Laterality: N/A;  . Loop recorder explant  08/31/2013    Procedure: LOOP RECORDER EXPLANT;  Surgeon: Coralyn Mark, MD;  Location: [place] CATH LAB;  Service: Cardiovascular;;  . Colonoscopy with propofol N/A 10/22/2014    Procedure: COLONOSCOPY WITH PROPOFOL;  Surgeon: Jerene Bears, MD;  [facility] ENDOSCOPY;  Service: Gastroenterology;  Laterality: N/A;     Family History  Problem Relation Age of Onset  . Uterine cancer Mother   . Heart disease Mother   . Breast cancer Sister 83  . Ovarian cancer Sister   . Diabetes Brother   . Breast cancer Maternal Aunt   . Breast cancer Cousin 43  . Ovarian cancer Sister      Social History   Social History  . Marital  Status: Divorced    Spouse Name: N/A  . Number of Children: 2  . Years of Education: N/A   Occupational History  . Retired    Social History Main Topics  . Smoking status: Former Smoker -- 1.00 packs/day for 20 years    Types: Cigarettes    Quit date: 09/20/1998  . Smokeless tobacco:  Never Used     Comment: 1ppd x 20 years  . Alcohol Use: 0.6 oz/week    1 Glasses of wine per week     Comment: rare  . Drug Use: No  . Sexual Activity: Not Currently    Birth Control/ Protection: Post-menopausal   Other Topics Concern  . Not on file   Social History Narrative   Lives alone in an apartment.     BP 124/72 mmHg  Pulse 94  Ht 5' 5.5" (1.664 m)  Wt 315 lb 3.2 oz (142.974 kg)  BMI 51.64 kg/m2  Physical Exam:  Obese appearing 69 yo woman wearing home oxygen, and in NAD HEENT: Unremarkable Neck:  7 cm JVD, no thyromegally Back:  No CVA tenderness Lungs:  Clear with no wheezes; decreased breath sounds throughout. HEART:  Regular rate rhythm, no murmurs, no rubs, no clicks Abd:  soft, positive bowel sounds, no organomegally, no rebound, no guarding Ext:  2 plus pulses, no edema, no cyanosis, no clubbing Skin:  No rashes no nodules Neuro:  CN II through XII intact, motor grossly intact  DEVICE  Normal device function.  See PaceArt for details.   Assess/Plan:

## 2015-05-20 NOTE — Assessment & Plan Note (Signed)
Her blood pressure is improved. No change in medical therapy.

## 2015-05-27 ENCOUNTER — Other Ambulatory Visit: Payer: Self-pay | Admitting: Hematology

## 2015-05-27 ENCOUNTER — Ambulatory Visit
Admission: RE | Admit: 2015-05-27 | Discharge: 2015-05-27 | Disposition: A | Discharge status: Home / Self Care | Payer: Medicare Other | Source: Ambulatory Visit | Attending: Hematology | Admitting: Hematology

## 2015-05-27 DIAGNOSIS — Z1231 Encounter for screening mammogram for malignant neoplasm of breast: Secondary | ICD-10-CM

## 2015-05-27 DIAGNOSIS — Z853 Personal history of malignant neoplasm of breast: Secondary | ICD-10-CM

## 2015-05-28 ENCOUNTER — Other Ambulatory Visit: Payer: Self-pay | Admitting: Obstetrics and Gynecology

## 2015-05-28 ENCOUNTER — Other Ambulatory Visit: Payer: Self-pay | Admitting: Internal Medicine

## 2015-05-28 DIAGNOSIS — R928 Other abnormal and inconclusive findings on diagnostic imaging of breast: Secondary | ICD-10-CM

## 2015-06-02 ENCOUNTER — Other Ambulatory Visit: Payer: Self-pay | Admitting: *Deleted

## 2015-06-02 DIAGNOSIS — C50919 Malignant neoplasm of unspecified site of unspecified female breast: Secondary | ICD-10-CM

## 2015-06-03 ENCOUNTER — Encounter: Payer: Medicare Other | Admitting: Hematology

## 2015-06-03 ENCOUNTER — Other Ambulatory Visit: Payer: Medicare Other

## 2015-06-03 ENCOUNTER — Encounter: Payer: Self-pay | Admitting: Hematology

## 2015-06-03 NOTE — Progress Notes (Signed)
No show  This encounter was created in error - please disregard.

## 2015-06-04 ENCOUNTER — Telehealth: Payer: Self-pay | Admitting: Hematology

## 2015-06-04 ENCOUNTER — Ambulatory Visit
Admission: RE | Admit: 2015-06-04 | Discharge: 2015-06-04 | Disposition: A | Discharge status: Home / Self Care | Payer: Medicare Other | Source: Ambulatory Visit | Attending: Obstetrics and Gynecology | Admitting: Obstetrics and Gynecology

## 2015-06-04 DIAGNOSIS — R928 Other abnormal and inconclusive findings on diagnostic imaging of breast: Secondary | ICD-10-CM

## 2015-06-04 DIAGNOSIS — R921 Mammographic calcification found on diagnostic imaging of breast: Secondary | ICD-10-CM | POA: Diagnosis not present

## 2015-06-04 NOTE — Telephone Encounter (Signed)
per pof to r/s pt appt-cld & left pt a message of new appt time & date

## 2015-06-09 ENCOUNTER — Ambulatory Visit (HOSPITAL_BASED_OUTPATIENT_CLINIC_OR_DEPARTMENT_OTHER): Payer: Medicare Other | Admitting: Hematology

## 2015-06-09 ENCOUNTER — Encounter: Payer: Self-pay | Admitting: Hematology

## 2015-06-09 ENCOUNTER — Telehealth: Payer: Self-pay | Admitting: Hematology

## 2015-06-09 ENCOUNTER — Other Ambulatory Visit (HOSPITAL_BASED_OUTPATIENT_CLINIC_OR_DEPARTMENT_OTHER): Payer: Medicare Other

## 2015-06-09 VITALS — BP 109/75 | HR 86 | Temp 98.1°F | Resp 18 | Ht 65.5 in | Wt 309.0 lb

## 2015-06-09 DIAGNOSIS — I2510 Atherosclerotic heart disease of native coronary artery without angina pectoris: Secondary | ICD-10-CM

## 2015-06-09 DIAGNOSIS — C50919 Malignant neoplasm of unspecified site of unspecified female breast: Secondary | ICD-10-CM

## 2015-06-09 DIAGNOSIS — C50912 Malignant neoplasm of unspecified site of left female breast: Secondary | ICD-10-CM | POA: Diagnosis not present

## 2015-06-09 LAB — COMPREHENSIVE METABOLIC PANEL (CC13)
ALBUMIN: 3.8 g/dL (ref 3.5–5.0)
ALK PHOS: 91 U/L (ref 40–150)
ALT: 27 U/L (ref 0–55)
ANION GAP: 8 meq/L (ref 3–11)
AST: 27 U/L (ref 5–34)
BILIRUBIN TOTAL: 0.64 mg/dL (ref 0.20–1.20)
BUN: 9.1 mg/dL (ref 7.0–26.0)
CO2: 30 mEq/L — ABNORMAL HIGH (ref 22–29)
Calcium: 9.3 mg/dL (ref 8.4–10.4)
Chloride: 107 mEq/L (ref 98–109)
Creatinine: 0.9 mg/dL (ref 0.6–1.1)
EGFR: 73 mL/min/{1.73_m2} — AB (ref >90)
Glucose: 96 mg/dl (ref 70–140)
Potassium: 3.7 mEq/L (ref 3.5–5.1)
Sodium: 145 mEq/L (ref 136–145)
TOTAL PROTEIN: 7 g/dL (ref 6.4–8.3)

## 2015-06-09 LAB — CBC WITH DIFFERENTIAL/PLATELET
BASO%: 1.3 % (ref 0.0–2.0)
BASOS ABS: 0 10*3/uL (ref 0.0–0.1)
EOS ABS: 0.2 10*3/uL (ref 0.0–0.5)
EOS%: 4.9 % (ref 0.0–7.0)
HCT: 41.3 % (ref 34.8–46.6)
HGB: 13.1 g/dL (ref 11.6–15.9)
LYMPH%: 39.1 % (ref 14.0–49.7)
MCH: 28.8 pg (ref 25.1–34.0)
MCHC: 31.7 g/dL (ref 31.5–36.0)
MCV: 90.8 fL (ref 79.5–101.0)
MONO#: 0.4 10*3/uL (ref 0.1–0.9)
MONO%: 9.8 % (ref 0.0–14.0)
NEUT%: 44.9 % (ref 38.4–76.8)
NEUTROS ABS: 1.7 10*3/uL (ref 1.5–6.5)
PLATELETS: 150 10*3/uL (ref 145–400)
RBC: 4.54 10*6/uL (ref 3.70–5.45)
RDW: 14.3 % (ref 11.2–14.5)
WBC: 3.9 10*3/uL (ref 3.9–10.3)
lymph#: 1.5 10*3/uL (ref 0.9–3.3)

## 2015-06-09 NOTE — Progress Notes (Signed)
[place] OFFICE PROGRESS NOTE   [name], MD [address]   [id number]  DIAGNOSIS: Breast cancer, left  No chief complaint on file.      CURRENT THERAPY: Surveillance  INTERVAL HISTORY:  [name] [name] 69 y.o. female with history of infiltrating ductal carcinoma of the left breast (August 2006) who received all of her care for her breast cancer at [facility] is here for followup.  She was last seen by me 3 month ago.   She had screening mammogram a few weeks ago, and was called back due to the calcification. The diagnostic mammo showed calcification, but likely benign, and she will follow up in 6 month.   She is doing well, she has mild hemoptysis after colonoscopy in Aug, 2016, she is on Eliquis,  and CT chest was negative for PE or other findings. She did not have recurrent hemoptysis. She lives alone, works with a walker, able to function well at home. No other new complaints.     MEDICAL HISTORY: Past Medical History  Diagnosis Date  . Hypertension   . Hyperlipidemia   . Anemia   . CAD (coronary artery disease)     a. Nonobstructive CAD 2007 (EF 75%.  RCA  proximal 75% tubular, 25% prox LAD, 25% d1, 50% D2) at [facility]. b. NSTEMI 01/2010 in setting of respiratory failure, medical approach (not a good candidate for noninvasive eval)  . OSA on CPAP   . Hypothyroidism   . Depression 06/13/2011  . Neuropathy 06/13/2011  . Cervical radiculopathy 06/13/2011  . Gout 06/13/2011  . Obesity hypoventilation syndrome 06/13/2011  . COPD (chronic obstructive pulmonary disease)     on O2  . Breast cancer dx'd 2005    No BP check or stick in Left arm  . Cellulitis     hx of cellulitis in left leg  . Impaired glucose tolerance 11/01/2012  . PFO (patent foramen ovale)   . Atrial flutter   . Atrial fibrillation   . Syncope and collapse     Bradycardia with pauses. S/P pacemaker  . Risk for falls   . Asthma 06/13/2011    hx of  . Hx of dysfunctional  uterine bleeding     last bleeding jan 2016, worked up for with d and c x 2 no cause found  . History of Clostridium difficile early dec 2015    no current diarrhea  . History of home oxygen therapy     2 liters per nasal cannula all the time    ALLERGIES:  is allergic to cephalexin; codeine; hydromorphone; lisinopril; penicillins; and cleocin.  MEDICATIONS: Current Outpatient Prescriptions on File Prior to Visit  Medication Sig Dispense Refill  . albuterol (PROVENTIL HFA;VENTOLIN HFA) 108 (90 BASE) MCG/ACT inhaler Inhale 2 puffs into the lungs every 6 (six) hours as needed for wheezing or shortness of breath.    . allopurinol (ZYLOPRIM) 300 MG tablet Take 300 mg by mouth daily.    [name] allopurinol (ZYLOPRIM) 300 MG tablet TAKE 1 TABLET DAILY 90 tablet 2  . amitriptyline (ELAVIL) 100 MG tablet Take 1 tablet (100 mg total) by mouth at bedtime as needed for sleep. 90 tablet 1  . apixaban (ELIQUIS) 5 MG TABS tablet Take 5 mg by mouth 2 (two) times daily.    . Cholecalciferol (VITAMIN D) 2000 UNITS tablet Take 2,000 Units by mouth at bedtime.    . DULoxetine (CYMBALTA) 30 MG capsule TAKE 1 CAPSULE TWICE A  DAY 180 capsule 1  . ferrous sulfate 325 (65 FE) MG tablet Take 325 mg by mouth daily with breakfast.    . furosemide (LASIX) 40 MG tablet TAKE 1 TABLET EVERY MORNING AND 1 TABLET EVERY OTHER NIGHT AS DIRECTED 90 tablet 2  . levothyroxine (SYNTHROID, LEVOTHROID) 200 MCG tablet TAKE 1 TABLET DAILY 90 tablet 1  . metoprolol tartrate (LOPRESSOR) 25 MG tablet Take 25 mg by mouth 2 (two) times daily.    . Omega-3 Fatty Acids (FISH OIL PO) Take 1 capsule by mouth daily.     [name] omeprazole (PRILOSEC) 40 MG capsule Take 1 capsule (40 mg total) by mouth daily. 30 capsule 2  . potassium chloride SA (K-DUR,KLOR-CON) 20 MEQ tablet Take 20 mEq by mouth 2 (two) times daily.    . pravastatin (PRAVACHOL) 40 MG tablet TAKE 1 TABLET DAILY 90 tablet 3  . vitamin C (ASCORBIC ACID) 500 MG tablet Take 500 mg by mouth  daily.      No current facility-administered medications on file prior to visit.  ;  SURGICAL HISTORY:  Past Surgical History  Procedure Laterality Date  . Ovarian cyst removal    . Breast lumpectomy Left   . Cervical disc surgery  2004 or 2005    have cadaver bones,, screws, and plates c 5 to c 6  . Cervical biopsy  June 2013    at [facility] for Heavy  Bleeding  . Dilation and curettage of uterus    . Colonoscopy    . Laparoscopic appendectomy  03/29/2012    Procedure: APPENDECTOMY LAPAROSCOPIC;  Surgeon: Adin Hector, MD;  [facility]: [facility];  Service: General;  Laterality: N/A;  . Tee without cardioversion N/A 03/15/2013    Procedure: TRANSESOPHAGEAL ECHOCARDIOGRAM (TEE);  Surgeon: Thayer Headings, MD;  Location: [place];  Service: Cardiovascular;  Laterality: N/A;  . Cardioversion N/A 03/15/2013    Procedure: CARDIOVERSION;  Surgeon: Thayer Headings, MD;  Location: [facility] ENDOSCOPY;  Service: Cardiovascular;  Laterality: N/A;  . Loop recorder implant  08-28-2013; 08-31-2013    MDT LinQ implanted by Dr Rayann Heman for syncope; explanted 08-31-2013 after sinus pauses identified  . Pacemaker insertion  08-31-2013    Biotronik Evera dual chamber pacemaker placed for neurocardiogenic syncope and sinus pauses by Dr Rayann Heman  . Loop recorder implant N/A 08/28/2013    Procedure: LOOP RECORDER IMPLANT;  Surgeon: Coralyn Mark, MD;  Location: [place] CATH LAB;  Service: Cardiovascular;  Laterality: N/A;  . Permanent pacemaker insertion N/A 08/31/2013    Procedure: PERMANENT PACEMAKER INSERTION;  Surgeon: Coralyn Mark, MD;  Location: [place] CATH LAB;  Service: Cardiovascular;  Laterality: N/A;  . Loop recorder explant  08/31/2013    Procedure: LOOP RECORDER EXPLANT;  Surgeon: Coralyn Mark, MD;  Location: [place] CATH LAB;  Service: Cardiovascular;;  . Colonoscopy with propofol N/A 10/22/2014    Procedure: COLONOSCOPY WITH PROPOFOL;  Surgeon: Jerene Bears, MD;  [facility] ENDOS[facility];  Service: Gastroenterology;   Laterality: N/A;    REVIEW OF SYSTEMS:   Constitutional: Denies fevers, chills or abnormal weight loss Eyes: Denies blurriness of vision Ears, nose, mouth, throat, and face: Denies mucositis or sore throat Respiratory: Denies cough, dyspnea or wheezes Cardiovascular: Denies palpitation, chest discomfort or lower extremity swelling Gastrointestinal:  Denies nausea, heartburn or change in bowel habits Skin: Denies abnormal skin rashes Lymphatics: Denies new lymphadenopathy or easy bruising Neurological:She reports numbness, tingling of her hands and feets but denies new weaknesses Behavioral/Psych: Mood is stable, no  new changes  All other systems were reviewed with the patient and are negative.  PHYSICAL EXAMINATION: ECOG PERFORMANCE STATUS: 2  Blood pressure 109/75, pulse 86, temperature 98.1 F (36.7 C), temperature source Oral, resp. rate 18, height 5' 5.5" (1.664 m), weight 309 lb (140.161 kg), SpO2 98 %.  GENERAL:alert, no distress and comfortable; Morbidly obese. SKIN: skin color, texture, turgor are normal, no rashes or significant lesions EYES: normal, Conjunctiva are pink and non-injected, sclera clear OROPHARYNX:no exudate, no erythema and lips, buccal mucosa, and tongue normal  NECK: supple, thyroid normal size, non-tender, without nodularity LYMPH:  no palpable lymphadenopathy in the cervical, axillary or supraclavicular LUNGS: clear to auscultation and percussion with normal breathing effort HEART: regular rate & rhythm and no murmurs and no chronic pitting extremity edema BREASTS: Large, right breast is benign, no palpable mass.  Left breast shows the effects of radiation with skin changes but no suspicious finding suggestive of recurrence.  No axillary adenopathy.  ABDOMEN:abdomen soft, non-tender and normal bowel sounds Musculoskeletal:no cyanosis of digits and no clubbing  NEURO: alert & oriented x 3 with fluent speech, no focal motor/sensory deficits  Labs:  CBC  Latest Ref Rng 06/09/2015 05/06/2015 10/14/2014  WBC 3.9 - 10.3 10e3/uL 3.9 5.1 13.7(H)  Hemoglobin 11.6 - 15.9 g/dL 13.1 13.6 12.7  Hematocrit 34.8 - 46.6 % 41.3 41.7 39.5  Platelets 145 - 400 10e3/uL 150 157.0 172    CMP Latest Ref Rng 06/09/2015 05/06/2015 04/30/2015  Glucose 70 - 140 mg/dl 96 - 104(H)  BUN 7.0 - 26.0 mg/dL 9.1 - 10  Creatinine 0.6 - 1.1 mg/dL 0.9 - 0.88  Sodium 136 - 145 mEq/L 145 - 143  Potassium 3.5 - 5.1 mEq/L 3.7 - 3.7  Chloride 96 - 112 mEq/L - - 103  CO2 22 - 29 mEq/L 30(H) - 29  Calcium 8.4 - 10.4 mg/dL 9.3 - 9.8  Total Protein 6.4 - 8.3 g/dL 7.0 7.4 -  Total Bilirubin 0.20 - 1.20 mg/dL 0.64 0.4 -  Alkaline Phos 40 - 150 U/L 91 85 -  AST 5 - 34 U/L 27 23 -  ALT 0 - 55 U/L 27 22 -     RADIOGRAPHIC STUDIES: Diagnostic left breast mammogram IMPRESSION: 1. Several loosely grouped punctate and coarse calcifications within the inner left breast at the surgical site, morphology better appreciated on the CC projection. These are probably benign calcifications for which a six-month follow-up left breast diagnostic mammogram, with magnification views, is recommended. 2. Left breast retroareolar asymmetry compatible with superimposition of normal dense fibroglandular tissues on today's additional views with tomosynthesis. Attention to this area also recommended on the six-month follow-up exam to ensure stability.  RECOMMENDATION: Follow-up left breast diagnostic mammogram, with possible ultrasound, in 6 months.  PATHOLOGY: Diagnosis Endometrium, biopsy - DEGENERATING SECRETORY-TYPE ENDOMETRIUM. - THERE IS NO EVIDENCE OF HYPERPLASIA OR MALIGNANCY. - SEE COMMENT. Microscopic Comment This pattern can be associated with menses, irregular shedding or breakthrough bleeding associated with hormonal therapy. Clinical correlation is suggested. (JBK:caf 05/14/13) Enid Cutter MD Pathologist, Electronic Signature (Case signed 05/14/2013)  ASSESSMENT: [name] [name]  69 y.o. female with a history of Breast cancer, left  PLAN:  1. T2N0, stage IIA triple negative infiltrating ductal carcinoma of the left breast (2006).  --She is now over 9 years from the time of diagnosis without evidence of disease.  Her physical exam and last mammogram in 05/2015 showed no evidence of recurrence, except some calcificantion which are likely benign.  She is agreeable  to have a follow-up mammogram in 6 months. -We discussed that the risk of cancer recurrence was triple-negative breast cancer is minimal after 9 years, but she can have a second breast cancer and no lower continue surveillance. -I encouraged her to continue healthy diet and a regular exercise.  2. Left breast calcification -This was found on the screening mammogram in September 2016, and diagnostic mammogram. -She will have a repeated mammogram in 6 months.  3. She will continue follow-up with her primary care physician for other medical issues.  Plan -Diagnostic a mammogram in 6 months -I'll see her back in one year.  All questions were answered. The patient knows to call the clinic with any problems, questions or concerns. We can certainly see the patient much sooner if necessary.  The patient was provided an after visit summary.   I spent 15 minutes counseling the patient face to face. The total time spent in the appointment was 25 minutes.    Truitt Merle  06/09/2015

## 2015-06-09 NOTE — Telephone Encounter (Signed)
Pt confirmed labs/ov per 09/19 POF, gave pt AVS and Calendar... KJ

## 2015-06-10 NOTE — Progress Notes (Signed)
HPI: fu atrial flutter. Also with hx of CAD, HTN, HL, OSA, hypothyroidism, COPD on O2, L breast CA. LHC at [facility] 10/2005: pRCA 75%, LM < 25%, pLAD 25%, D1 25%, D2 50%. Carotid Dopplers June 2013 showed no significant stenosis. She suffered a NSTEMI in 01/2010 in the setting of SIRS from leg cellulitis. This was felt to be demand ischemia. Med Rx was pursued (felt to be a poor candidate for noninvasive imaging). Echo 02/2012: EF 55-60%, mild AI, MAC, mod LAE, PASP mildly increased. Patient had atrial flutter with RVR 6/14. She was felt to be a poor candidate for atrial flutter ablation. TEE guided cardioversion was performed. She had restoration of NSR. TEE 03/15/13: EF 55-60%, no LA clot, positive PFO, mild to moderate TR. Patient admitted in December of 2014 following a syncopal episode. She had an implantable loop placed which demonstrated symptomatic bradycardia with pauses and ultimately had a pacemaker placed. Since last seen, she denies dyspnea, chest pain, palpitations or syncope. She has lost approximately 50 pounds by dieting.  Current Outpatient Prescriptions  Medication Sig Dispense Refill  . albuterol (PROVENTIL HFA;VENTOLIN HFA) 108 (90 BASE) MCG/ACT inhaler Inhale 2 puffs into the lungs every 6 (six) hours as needed for wheezing or shortness of breath.    . allopurinol (ZYLOPRIM) 300 MG tablet Take 300 mg by mouth daily.    [name] amitriptyline (ELAVIL) 100 MG tablet Take 1 tablet (100 mg total) by mouth at bedtime as needed for sleep. 90 tablet 1  . apixaban (ELIQUIS) 5 MG TABS tablet Take 5 mg by mouth 2 (two) times daily.    . Cholecalciferol (VITAMIN D) 2000 UNITS tablet Take 2,000 Units by mouth at bedtime.    . DULoxetine (CYMBALTA) 30 MG capsule TAKE 1 CAPSULE TWICE A DAY 180 capsule 1  . ferrous sulfate 325 (65 FE) MG tablet Take 325 mg by mouth daily with breakfast.    . furosemide (LASIX) 40 MG tablet TAKE 1 TABLET EVERY MORNING AND 1 TABLET EVERY OTHER NIGHT AS DIRECTED 90 tablet  2  . levothyroxine (SYNTHROID, LEVOTHROID) 200 MCG tablet TAKE 1 TABLET DAILY 90 tablet 1  . metoprolol tartrate (LOPRESSOR) 25 MG tablet Take 25 mg by mouth 2 (two) times daily.    . Omega-3 Fatty Acids (FISH OIL PO) Take 1 capsule by mouth daily.     [name] omeprazole (PRILOSEC) 40 MG capsule Take 1 capsule (40 mg total) by mouth daily. 30 capsule 2  . potassium chloride SA (K-DUR,KLOR-CON) 20 MEQ tablet Take 20 mEq by mouth 2 (two) times daily.    . pravastatin (PRAVACHOL) 40 MG tablet TAKE 1 TABLET DAILY 90 tablet 3  . vitamin C (ASCORBIC ACID) 500 MG tablet Take 500 mg by mouth daily.      No current facility-administered medications for this visit.     Past Medical History  Diagnosis Date  . Hypertension   . Hyperlipidemia   . Anemia   . CAD (coronary artery disease)     a. Nonobstructive CAD 2007 (EF 75%.  RCA  proximal 75% tubular, 25% prox LAD, 25% d1, 50% D2) at [facility]. b. NSTEMI 01/2010 in setting of respiratory failure, medical approach (not a good candidate for noninvasive eval)  . OSA on CPAP   . Hypothyroidism   . Depression 06/13/2011  . Neuropathy 06/13/2011  . Cervical radiculopathy 06/13/2011  . Gout 06/13/2011  . Obesity hypoventilation syndrome 06/13/2011  . COPD (chronic obstructive pulmonary disease)  on O2  . Breast cancer dx'd 2005    No BP check or stick in Left arm  . Cellulitis     hx of cellulitis in left leg  . Impaired glucose tolerance 11/01/2012  . PFO (patent foramen ovale)   . Atrial flutter   . Atrial fibrillation   . Syncope and collapse     Bradycardia with pauses. S/P pacemaker  . Risk for falls   . Asthma 06/13/2011    hx of  . Hx of dysfunctional uterine bleeding     last bleeding jan 2016, worked up for with d and c x 2 no cause found  . History of Clostridium difficile early dec 2015    no current diarrhea  . History of home oxygen therapy     2 liters per nasal cannula all the time    Past Surgical History  Procedure Laterality Date    . Ovarian cyst removal    . Breast lumpectomy Left   . Cervical disc surgery  2004 or 2005    have cadaver bones,, screws, and plates c 5 to c 6  . Cervical biopsy  June 2013    at [facility] for Heavy  Bleeding  . Dilation and curettage of uterus    . Colonoscopy    . Laparoscopic appendectomy  03/29/2012    Procedure: APPENDECTOMY LAPAROSCOPIC;  Surgeon: Adin Hector, MD;  Location: [facility];  Service: General;  Laterality: N/A;  . Tee without cardioversion N/A 03/15/2013    Procedure: TRANSESOPHAGEAL ECHOCARDIOGRAM (TEE);  Surgeon: Thayer Headings, MD;  Location: [place];  Service: Cardiovascular;  Laterality: N/A;  . Cardioversion N/A 03/15/2013    Procedure: CARDIOVERSION;  Surgeon: Thayer Headings, MD;  Location: [facility] ENDOSCOPY;  Service: Cardiovascular;  Laterality: N/A;  . Loop recorder implant  08-28-2013; 08-31-2013    MDT LinQ implanted by Dr Rayann Heman for syncope; explanted 08-31-2013 after sinus pauses identified  . Pacemaker insertion  08-31-2013    Biotronik Evera dual chamber pacemaker placed for neurocardiogenic syncope and sinus pauses by Dr Rayann Heman  . Loop recorder implant N/A 08/28/2013    Procedure: LOOP RECORDER IMPLANT;  Surgeon: Coralyn Mark, MD;  Location: [place] CATH LAB;  Service: Cardiovascular;  Laterality: N/A;  . Permanent pacemaker insertion N/A 08/31/2013    Procedure: PERMANENT PACEMAKER INSERTION;  Surgeon: Coralyn Mark, MD;  Location: [place] CATH LAB;  Service: Cardiovascular;  Laterality: N/A;  . Loop recorder explant  08/31/2013    Procedure: LOOP RECORDER EXPLANT;  Surgeon: Coralyn Mark, MD;  Location: [place] CATH LAB;  Service: Cardiovascular;;  . Colonoscopy with propofol N/A 10/22/2014    Procedure: COLONOSCOPY WITH PROPOFOL;  Surgeon: Jerene Bears, MD;  [facility] ENDOSCOPY;  Service: Gastroenterology;  Laterality: N/A;    Social History   Social History  . Marital Status: Divorced    Spouse Name: N/A  . Number of Children: 2  . Years of Education: N/A    Occupational History  . Retired    Social History Main Topics  . Smoking status: Former Smoker -- 1.00 packs/day for 20 years    Types: Cigarettes    Quit date: 09/20/1998  . Smokeless tobacco: Never Used     Comment: 1ppd x 20 years  . Alcohol Use: 0.6 oz/week    1 Glasses of wine per week     Comment: rare  . Drug Use: No  . Sexual Activity: Not Currently    Birth Control/ Protection: Post-menopausal  Other Topics Concern  . Not on file   Social History Narrative   Lives alone in an apartment.    ROS: no fevers or chills, productive cough, hemoptysis, dysphasia, odynophagia, melena, hematochezia, dysuria, hematuria, rash, seizure activity, orthopnea, PND, pedal edema, claudication. Remaining systems are negative.  Physical Exam: Well-developed obese in no acute distress.  Skin is warm and dry.  HEENT is normal.  Neck is supple.  Chest is clear to auscultation with normal expansion.  Cardiovascular exam is regular rate and rhythm.  Abdominal exam nontender or distended. No masses palpated. Extremities show no edema. neuro grossly intact  ECG sinus rhythm at a rate of 90. Nonspecific ST changes.

## 2015-06-11 ENCOUNTER — Encounter: Payer: Self-pay | Admitting: Pulmonary Disease

## 2015-06-11 ENCOUNTER — Ambulatory Visit (INDEPENDENT_AMBULATORY_CARE_PROVIDER_SITE_OTHER): Payer: Medicare Other | Admitting: Pulmonary Disease

## 2015-06-11 VITALS — BP 122/74 | HR 90 | Ht 65.5 in | Wt 310.4 lb

## 2015-06-11 DIAGNOSIS — I2510 Atherosclerotic heart disease of native coronary artery without angina pectoris: Secondary | ICD-10-CM | POA: Diagnosis not present

## 2015-06-11 DIAGNOSIS — G4730 Sleep apnea, unspecified: Secondary | ICD-10-CM

## 2015-06-11 DIAGNOSIS — Z23 Encounter for immunization: Secondary | ICD-10-CM | POA: Diagnosis not present

## 2015-06-11 DIAGNOSIS — J449 Chronic obstructive pulmonary disease, unspecified: Secondary | ICD-10-CM

## 2015-06-11 NOTE — Assessment & Plan Note (Signed)
Ct cpap 10 cm Supplies renewed x 1 yr  Weight loss encouraged, compliance with goal of at least 4-6 hrs every night is the expectation. Advised against medications with sedative side effects Cautioned against driving when sleepy - understanding that sleepiness will vary on a day to day basis

## 2015-06-11 NOTE — Progress Notes (Signed)
   Subjective:    Patient ID: [name] [name], female    DOB: [birth year], 69 y.o.   MRN: [id number]  HPI  PCP - Jenny Reichmann   69 year old morbidly obese female ex smoker (quit 2000) for FU of copd & OSA on 10 cm.  PMH -OSA, hypertension,breast cancer with left lumpectomy and hypothyroidism , dCHF , PFO    06/11/2015  Chief Complaint  Patient presents with  . Follow-up    breathing has been doing very well, not on oxygen anymore.  no concerns.  flu shot   Bloody mucus has subsided,She is on Eliquis.   Has lost 50lbs since last year-from 360+ to 310 Hx of breast cancer. Lumpectomy on left s/p chemo/rad 2005.  Previously on symbicort but did not see any difference .  Spiriva caused her to cough . Off all  inhalers at this time  Wearing CPAP At bedtime , wears for 8hr each night.  Feels rested.  Off O2   Significant tests/ events  Duplex neg DVT, CT angio neg PE, echo >> nml LV fn, mod LVH.  PFTs showed moderate airway obstruction FEV1 48%, no BD response, mild restriction, DLCO corrects for alveolar volume c/w obesity  PSG 7/211 surprisingly showed AHI 0.7/h & RDI 9/h, 36 RERAs over 249 mins TST, lowest desatn 92% on 2 L [place]  July 2011 -ONO showed significant desaturation for > 4h  ECHO TEE >EF ok, PFO +bubble   Review of Systems neg for any significant sore throat, dysphagia, itching, sneezing, nasal congestion or excess/ purulent secretions, fever, chills, sweats, unintended wt loss, pleuritic or exertional cp, hempoptysis, orthopnea pnd or change in chronic leg swelling. Also denies presyncope, palpitations, heartburn, abdominal pain, nausea, vomiting, diarrhea or change in bowel or urinary habits, dysuria,hematuria, rash, arthralgias, visual complaints, headache, numbness weakness or ataxia.     Objective:   Physical Exam  Gen. Pleasant, obese, in no distress ENT - no lesions, no post nasal drip Neck: No JVD, no thyromegaly, no carotid bruits Lungs: no use of  accessory muscles, no dullness to percussion, decreased without rales or rhonchi  Cardiovascular: Rhythm regular, heart sounds  normal, no murmurs or gallops, no peripheral edema Musculoskeletal: No deformities, no cyanosis or clubbing , no tremors       Assessment & Plan:

## 2015-06-11 NOTE — Assessment & Plan Note (Signed)
OK to observe off meds & focus on wt loss Pulm rehab in future

## 2015-06-11 NOTE — Patient Instructions (Signed)
Stay on CPAP Congratulations on your weight loss ! Goal 275 - repeat sleep study if you do that !

## 2015-06-12 ENCOUNTER — Ambulatory Visit (INDEPENDENT_AMBULATORY_CARE_PROVIDER_SITE_OTHER): Payer: Medicare Other | Admitting: Cardiology

## 2015-06-12 ENCOUNTER — Encounter: Payer: Self-pay | Admitting: Cardiology

## 2015-06-12 VITALS — BP 136/72 | HR 90 | Ht 65.0 in | Wt 308.6 lb

## 2015-06-12 DIAGNOSIS — E785 Hyperlipidemia, unspecified: Secondary | ICD-10-CM | POA: Diagnosis not present

## 2015-06-12 DIAGNOSIS — Z6841 Body Mass Index (BMI) 40.0 and over, adult: Secondary | ICD-10-CM

## 2015-06-12 DIAGNOSIS — I2510 Atherosclerotic heart disease of native coronary artery without angina pectoris: Secondary | ICD-10-CM | POA: Diagnosis not present

## 2015-06-12 DIAGNOSIS — Z950 Presence of cardiac pacemaker: Secondary | ICD-10-CM

## 2015-06-12 DIAGNOSIS — I10 Essential (primary) hypertension: Secondary | ICD-10-CM

## 2015-06-12 DIAGNOSIS — I5032 Chronic diastolic (congestive) heart failure: Secondary | ICD-10-CM

## 2015-06-12 DIAGNOSIS — I480 Paroxysmal atrial fibrillation: Secondary | ICD-10-CM | POA: Diagnosis not present

## 2015-06-12 DIAGNOSIS — I4892 Unspecified atrial flutter: Secondary | ICD-10-CM

## 2015-06-12 NOTE — Assessment & Plan Note (Signed)
Followed by electrophysiology. 

## 2015-06-12 NOTE — Assessment & Plan Note (Signed)
Blood pressure controlled. Continue present medications. 

## 2015-06-12 NOTE — Assessment & Plan Note (Signed)
Patient remains in sinus rhythm. Continue beta blocker and apixaban.

## 2015-06-12 NOTE — Assessment & Plan Note (Signed)
Continue statin. Not on aspirin given need for anticoagulation.

## 2015-06-12 NOTE — Patient Instructions (Signed)
Your physician wants you to follow-up in: 6 MONTHS WITH DR CRENSHAW You will receive a reminder letter in the mail two months in advance. If you don't receive a letter, please call our office to schedule the follow-up appointment.  

## 2015-06-12 NOTE — Assessment & Plan Note (Signed)
Patient has lost 50 pounds recently. I encouraged her to continue her diet and she states she feels markedly better.

## 2015-06-12 NOTE — Assessment & Plan Note (Signed)
Continue statin. 

## 2015-06-12 NOTE — Assessment & Plan Note (Signed)
Patient is euvolemic. Continue present dose of Lasix.

## 2015-06-25 ENCOUNTER — Other Ambulatory Visit: Payer: Medicare Other

## 2015-06-25 ENCOUNTER — Ambulatory Visit: Payer: Medicare Other

## 2015-07-01 ENCOUNTER — Other Ambulatory Visit: Payer: Self-pay | Admitting: Internal Medicine

## 2015-07-08 ENCOUNTER — Encounter: Payer: Self-pay | Admitting: *Deleted

## 2015-07-13 ENCOUNTER — Other Ambulatory Visit: Payer: Self-pay | Admitting: Internal Medicine

## 2015-07-16 ENCOUNTER — Encounter: Payer: Self-pay | Admitting: Internal Medicine

## 2015-07-16 ENCOUNTER — Ambulatory Visit (INDEPENDENT_AMBULATORY_CARE_PROVIDER_SITE_OTHER): Payer: Medicare Other | Admitting: Internal Medicine

## 2015-07-16 VITALS — BP 106/60 | HR 80 | Ht 64.0 in | Wt 308.4 lb

## 2015-07-16 VITALS — BP 110/66 | HR 82 | Temp 98.0°F | Wt 308.6 lb

## 2015-07-16 DIAGNOSIS — J387 Other diseases of larynx: Secondary | ICD-10-CM

## 2015-07-16 DIAGNOSIS — K219 Gastro-esophageal reflux disease without esophagitis: Secondary | ICD-10-CM

## 2015-07-16 DIAGNOSIS — L659 Nonscarring hair loss, unspecified: Secondary | ICD-10-CM | POA: Diagnosis not present

## 2015-07-16 DIAGNOSIS — I2510 Atherosclerotic heart disease of native coronary artery without angina pectoris: Secondary | ICD-10-CM | POA: Diagnosis not present

## 2015-07-16 DIAGNOSIS — M19041 Primary osteoarthritis, right hand: Secondary | ICD-10-CM

## 2015-07-16 DIAGNOSIS — M19049 Primary osteoarthritis, unspecified hand: Secondary | ICD-10-CM | POA: Insufficient documentation

## 2015-07-16 DIAGNOSIS — M25511 Pain in right shoulder: Secondary | ICD-10-CM

## 2015-07-16 DIAGNOSIS — M19042 Primary osteoarthritis, left hand: Secondary | ICD-10-CM

## 2015-07-16 MED ORDER — OMEPRAZOLE 40 MG PO CPDR: 40.0000 mg | DELAYED_RELEASE_CAPSULE | Freq: Every day | ORAL | Status: DC

## 2015-07-16 MED ORDER — DICLOFENAC SODIUM 1 % TD GEL
4.0000 g | Freq: Four times a day (QID) | TRANSDERMAL | Status: DC | PRN
Start: 2015-07-16 — End: 2016-10-12

## 2015-07-16 MED ORDER — TRAMADOL HCL 50 MG PO TABS: 50.0000 mg | ORAL_TABLET | Freq: Three times a day (TID) | ORAL | Status: DC | PRN

## 2015-07-16 NOTE — Patient Instructions (Addendum)
Please take all new medication as prescribed - the tramadol for pain, and the voltaren gel for the arthritis  Please continue all other medications as before, and refills have been done if requested.  Please have the pharmacy call with any other refills you may need.  You will be contacted regarding the referral for: Dr Tamala Julian in this office (sports medicine), and dermatology  Please keep your appointments with your specialists as you may have planned

## 2015-07-16 NOTE — Progress Notes (Signed)
Subjective:    Patient ID: [name] [name], female    DOB: 12-31-1945, 69 y.o.   MRN: [id number]  HPI  Here to discuss pain, not sure of cause, has d/w prior with other providers as well, worse in last few days, most recently focused on right shoulder where in bed last night she could not even raise the arm;  Also with worsening persistent bilat hand DIP and PIP pain with bony arthritic changes.  Also asks for derm referral due to hair loss worsening.  Denies worsening depressive symptoms, suicidal ideation, or panic; has ongoing anxiety.  Pt denies chest pain, increased sob or doe, wheezing, orthopnea, PND, increased LE swelling, palpitations, dizziness or syncope.   Pt denies new neurological symptoms such as new headache, or facial or extremity weakness or numbness   Also with worsening hair loss recently, no scalp pain or swelling, asks for derm referral Past Medical History  Diagnosis Date  . Hypertension   . Hyperlipidemia   . Anemia   . CAD (coronary artery disease)     a. Nonobstructive CAD 2007 (EF 75%.  RCA  proximal 75% tubular, 25% prox LAD, 25% d1, 50% D2) at [facility] b. NSTEMI 01/2010 in setting of respiratory failure, medical approach (not a good candidate for noninvasive eval)  . OSA on CPAP   . Hypothyroidism   . Depression 06/13/2011  . Neuropathy ([place]) 06/13/2011  . Cervical radiculopathy 06/13/2011  . Gout 06/13/2011  . Obesity hypoventilation syndrome ([place]) 06/13/2011  . COPD (chronic obstructive pulmonary disease) ([place])     on O2  . Breast cancer ([place]) dx'd 2005    No BP check or stick in Left arm  . Cellulitis     hx of cellulitis in left leg  . Impaired glucose tolerance 11/01/2012  . PFO (patent foramen ovale)   . Atrial flutter ([place])   . Atrial fibrillation ([place])   . Syncope and collapse     Bradycardia with pauses. S/P pacemaker  . Risk for falls   . Asthma 06/13/2011    hx of  . Hx of dysfunctional uterine bleeding     last bleeding jan 2016, worked up for with d  and c x 2 no cause found  . History of Clostridium difficile early dec 2015    no current diarrhea  . History of home oxygen therapy     2 liters per nasal cannula all the time  . Diverticulosis   . Tubular adenoma of colon   . Esophageal dysmotility    Past Surgical History  Procedure Laterality Date  . Ovarian cyst removal    . Breast lumpectomy Left   . Cervical disc surgery  2004 or 2005    have cadaver bones,, screws, and plates c 5 to c 6  . Cervical biopsy  June 2013    at [facility] for Heavy  Bleeding  . Dilation and curettage of uterus    . Colonoscopy    . Laparoscopic appendectomy  03/29/2012    Procedure: APPENDECTOMY LAPAROSCOPIC;  Surgeon: Adin Hector, MD;  [facility];  Service: General;  Laterality: N/A;  . Tee without cardioversion N/A 03/15/2013    Procedure: TRANSESOPHAGEAL ECHOCARDIOGRAM (TEE);  Surgeon: Thayer Headings, MD;  Location: [place];  Service: Cardiovascular;  Laterality: N/A;  . Cardioversion N/A 03/15/2013    Procedure: CARDIOVERSION;  Surgeon: Thayer Headings, MD;  Location: [place];  Service: Cardiovascular;  Laterality: N/A;  . Loop recorder implant  08-28-2013;  08-31-2013    MDT LinQ implanted by Dr Rayann Heman for syncope; explanted 08-31-2013 after sinus pauses identified  . Pacemaker insertion  08-31-2013    Biotronik Evera dual chamber pacemaker placed for neurocardiogenic syncope and sinus pauses by Dr Rayann Heman  . Loop recorder implant N/A 08/28/2013    Procedure: LOOP RECORDER IMPLANT;  Surgeon: Coralyn Mark, MD;  Location: [place] CATH LAB;  Service: Cardiovascular;  Laterality: N/A;  . Permanent pacemaker insertion N/A 08/31/2013    Procedure: PERMANENT PACEMAKER INSERTION;  Surgeon: Coralyn Mark, MD;  Location: [place] CATH LAB;  Service: Cardiovascular;  Laterality: N/A;  . Loop recorder explant  08/31/2013    Procedure: LOOP RECORDER EXPLANT;  Surgeon: Coralyn Mark, MD;  Location: [place] CATH LAB;  Service: Cardiovascular;;  . Colonoscopy  with propofol N/A 10/22/2014    Procedure: COLONOSCOPY WITH PROPOFOL;  Surgeon: Jerene Bears, MD;  [facility];  Service: Gastroenterology;  Laterality: N/A;    reports that she quit smoking about 16 years ago. Her smoking use included Cigarettes. She has a 20 pack-year smoking history. She has never used smokeless tobacco. She reports that she drinks about 0.6 oz of alcohol per week. She reports that she does not use illicit drugs. family history includes Breast cancer in her maternal aunt; Breast cancer (age of onset: 39) in her sister; Breast cancer (age of onset: 73) in her cousin; Diabetes in her brother; Heart disease in her mother; Ovarian cancer in her sister and sister; Uterine cancer in her mother. Allergies  Allergen Reactions  . Cephalexin Other (See Comments)    Exfoliative dermatitis reaction  . Codeine     REACTION: unknown - pt. states unaware of having reaction to codeine  . Dilaudid [Hydromorphone] Other (See Comments)    Other Reaction: Oversedation  . Lisinopril     REACTION: cough  . Penicillins     rash  . Cleocin [Clindamycin Hcl] Rash    rash   Current Outpatient Prescriptions on File Prior to Visit  Medication Sig Dispense Refill  . albuterol (PROVENTIL HFA;VENTOLIN HFA) 108 (90 BASE) MCG/ACT inhaler Inhale 2 puffs into the lungs every 6 (six) hours as needed for wheezing or shortness of breath.    . allopurinol (ZYLOPRIM) 300 MG tablet Take 300 mg by mouth daily.    [name] amitriptyline (ELAVIL) 100 MG tablet take 1 tablet by mouth at bedtime if needed for sleep 90 tablet 1  . apixaban (ELIQUIS) 5 MG TABS tablet Take 5 mg by mouth 2 (two) times daily.    . Cholecalciferol (VITAMIN D) 2000 UNITS tablet Take 2,000 Units by mouth at bedtime.    . DULoxetine (CYMBALTA) 30 MG capsule TAKE 1 CAPSULE TWICE A DAY 180 capsule 1  . ferrous sulfate 325 (65 FE) MG tablet Take 325 mg by mouth daily with breakfast.    . furosemide (LASIX) 40 MG tablet TAKE 1 TABLET EVERY  MORNING AND ONE TABLET EVERY OTHER NIGHT AS DIRECTED 90 tablet 3  . levothyroxine (SYNTHROID, LEVOTHROID) 200 MCG tablet TAKE 1 TABLET DAILY 90 tablet 1  . metoprolol tartrate (LOPRESSOR) 25 MG tablet Take 25 mg by mouth 2 (two) times daily.    . Omega-3 Fatty Acids (FISH OIL PO) Take 1 capsule by mouth daily.     [name] omeprazole (PRILOSEC) 40 MG capsule Take 1 capsule (40 mg total) by mouth daily. 90 capsule 3  . potassium chloride SA (K-DUR,KLOR-CON) 20 MEQ tablet Take 20 mEq by mouth 2 (two) times  daily.    . pravastatin (PRAVACHOL) 40 MG tablet TAKE 1 TABLET DAILY 90 tablet 3  . vitamin C (ASCORBIC ACID) 500 MG tablet Take 500 mg by mouth daily.      No current facility-administered medications on file prior to visit.   Review of Systems  Constitutional: Negative for unusual diaphoresis or night sweats HENT: Negative for ringing in ear or discharge Eyes: Negative for double vision or worsening visual disturbance.  Respiratory: Negative for choking and stridor.   Gastrointestinal: Negative for vomiting or other signifcant bowel change Genitourinary: Negative for hematuria or change in urine volume.  Musculoskeletal: Negative for other MSK pain or swelling Skin: Negative for color change and worsening wound.  Neurological: Negative for tremors and numbness other than noted  Psychiatric/Behavioral: Negative for decreased concentration or agitation other than above       Objective:   Physical Exam BP 110/66 mmHg  Pulse 82  Temp(Src) 98 F (36.7 C) (Oral)  Wt 308 lb 9.6 oz (139.98 kg)  SpO2 98% VS noted,  Constitutional: Pt appears in no significant distress HENT: Head: NCAT.  Right Ear: External ear normal.  Left Ear: External ear normal.  Eyes: . Pupils are equal, round, and reactive to light. Conjunctivae and EOM are normal Neck: Normal range of motion. Neck supple.  Cardiovascular: Normal rate and regular rhythm.   Pulmonary/Chest: Effort normal and breath sounds without rales  or wheezing.  Abd:  Soft, NT, ND, + BS Neurological: Pt is alert. Not confused , motor grossly intact Skin: Skin is warm. No rash, no LE edema, does have female pattern type hair loss diffusely Psychiatric: Pt behavior is normal. No agitation.  Tender right bicipital tendon insertion site Bilat hands with OA changes with tender to DIP and PIP's      Assessment & Plan:

## 2015-07-16 NOTE — Patient Instructions (Signed)
We have sent the following prescriptions to your mail in pharmacy: Omeprazole 40 mg daily  If you have not heard from your mail in pharmacy within 1 week or if you have not received your medication in the mail, please contact us at [phone] so we may find out why.  Please follow up with Dr Hilarie Fredrickson in 1 year.

## 2015-07-16 NOTE — Progress Notes (Signed)
Subjective:    Patient ID: Rich Number, female    DOB: [birth year], 69 y.o.   MRN: [id number]  HPI [name] [name] is a 69 yo female with PMH of GERD, adenomatous colon polyps, diverticulosis, remote C. difficile is here for follow-up. She was last seen on 05/07/2015. She has a history of obesity, breast cancer in remission, A. fib on Eliquis, CAD, COPD and sleep apnea. At her last visit we discussed hoarseness throat clearing and cough. She also had some scant hemoptysis associated with her cough. She is not having true heartburn or true dysphagia.  After this visit she was started on omeprazole 40 mg daily. She reports significant improvement in her symptoms. She has noticed much less coughing and throat clearing. No further bloody sputum. She tried laying off the omeprazole and her symptoms returned after several days. She states she now knows this medication is beneficial for her. Appetite has remained good though she continues to try to lose weight. She has been successful to this end. Bowel movements been regular without blood in her stool or melena.   Review of Systems As per history of present illness, otherwise negative  Current Medications, Allergies, Past Medical History, Past Surgical History, Family History and Social History were reviewed in [employer] record.     Objective:   Physical Exam BP 106/60 mmHg  Pulse 80  Ht 5' 4"  (1.626 m)  Wt 308 lb 6 oz (139.878 kg)  BMI 52.91 kg/m2 Constitutional: Well-developed and well-nourished. No distress. HEENT: Normocephalic and atraumatic.[name] Conjunctivae are normal.  No scleral icterus. Neck: Neck supple.  Cardiovascular: Normal rate, regular rhythm and intact distal pulses. No M/R/G Pulmonary/chest: Effort normal and breath sounds normal. No wheezing, rales or rhonchi. Abdominal: Soft, nontender, nondistended. Bowel sounds active throughout.  Extremities: no clubbing, cyanosis, or edema Neurological: Alert and  oriented to person place and time. Psychiatric: Normal mood and affect. Behavior is normal.   CLINICAL DATA:  Hoarseness, coughing and dysphagia.   EXAM: ESOPHOGRAM/BARIUM SWALLOW   TECHNIQUE: Combined double contrast and single contrast examination performed using effervescent crystals, thick barium liquid, and thin barium liquid.   FLUOROSCOPY TIME:  Radiation Exposure Index (as provided by the fluoroscopic device):   If the device does not provide the exposure index:   Fluoroscopy Time:  1 minutes 49 seconds   Number of Acquired Images:  8   COMPARISON:  CT thorax 04/30/2015   FINDINGS: No mucosal lesion within the esophagus. No swallowing dysfunction of the high cervical esophagus.   There is mild escape of the barium bolus from the primary stripping wave. No tertiary contractions. No hiatal hernia. Barium tablet passed the GE junction easily.   IMPRESSION: 1. Mild esophageal dysmotility. 2. No reflux or esophageal mucosal lesion identified.     Electronically Signed   By: Suzy Bouchard M.D.   On: 05/12/2015 13:20      Assessment & Plan:  69 yo female with PMH of GERD, adenomatous colon polyps, diverticulosis, remote C. difficile is here for follow-up.  1. GERD with LPR and mild esophageal dysmotility -- LPR symptoms have improved on PPI. We discussed esophageal dysmotility and how this likely worsens in uncontrolled reflux disease. For now we will continue omeprazole 40 mg daily. We discussed antireflux precautions, eating smaller more frequent meals and avoiding lying down for 90 minutes of eating. Much of this she is already doing. We reviewed the barium swallow which did not show any evidence of stricture, mass lesion  or mucosal abnormality.  2. History of adenomatous colon polyps -- repeat colonoscopy February 2021  One year follow-up, sooner if necessary

## 2015-07-20 NOTE — Assessment & Plan Note (Signed)
Mild to mod, for volt gel prn,  to f/u any worsening symptoms or concerns

## 2015-07-20 NOTE — Assessment & Plan Note (Signed)
Exam c/w impingement syndrome, for pain control, sport medicine referral,  to f/u any worsening symptoms or concerns

## 2015-07-20 NOTE — Assessment & Plan Note (Signed)
Etiology unclear,  Lab Results  Component Value Date   TSH 0.38 05/06/2015   Suspect pt with female pattern baldness, may not have specific tx, for derm referral

## 2015-07-30 ENCOUNTER — Other Ambulatory Visit: Payer: Self-pay | Admitting: *Deleted

## 2015-07-30 MED ORDER — ALBUTEROL SULFATE HFA 108 (90 BASE) MCG/ACT IN AERS: 2.0000 | INHALATION_SPRAY | Freq: Four times a day (QID) | RESPIRATORY_TRACT | Status: DC | PRN

## 2015-07-30 NOTE — Telephone Encounter (Signed)
Receive call pt states she is needing refill on her albuterol inhaler. Verified pharmacy inform sending electronically to [facility]...[name] [name]

## 2015-08-19 ENCOUNTER — Ambulatory Visit (INDEPENDENT_AMBULATORY_CARE_PROVIDER_SITE_OTHER): Payer: Medicare Other | Admitting: *Deleted

## 2015-08-19 DIAGNOSIS — I495 Sick sinus syndrome: Secondary | ICD-10-CM | POA: Diagnosis not present

## 2015-08-21 ENCOUNTER — Ambulatory Visit (INDEPENDENT_AMBULATORY_CARE_PROVIDER_SITE_OTHER): Payer: Medicare Other | Admitting: Family Medicine

## 2015-08-21 ENCOUNTER — Encounter: Payer: Self-pay | Admitting: Family Medicine

## 2015-08-21 ENCOUNTER — Other Ambulatory Visit (INDEPENDENT_AMBULATORY_CARE_PROVIDER_SITE_OTHER): Payer: Medicare Other

## 2015-08-21 VITALS — BP 116/80 | HR 92 | Ht 64.0 in | Wt 308.0 lb

## 2015-08-21 DIAGNOSIS — M7501 Adhesive capsulitis of right shoulder: Secondary | ICD-10-CM | POA: Insufficient documentation

## 2015-08-21 DIAGNOSIS — M25511 Pain in right shoulder: Secondary | ICD-10-CM

## 2015-08-21 DIAGNOSIS — I2510 Atherosclerotic heart disease of native coronary artery without angina pectoris: Secondary | ICD-10-CM

## 2015-08-21 NOTE — Progress Notes (Signed)
Remote pacemaker transmission.   

## 2015-08-21 NOTE — Progress Notes (Signed)
Pre visit review using our clinic review tool, if applicable. No additional management support is needed unless otherwise documented below in the visit note. 

## 2015-08-21 NOTE — Patient Instructions (Signed)
Good to see you.  Ice 20 minutes 2 times daily. Usually after activity and before bed. Exercises 3 times a week.  Vitmain D 2000 IU daily See me again in 3-4 weeks.

## 2015-08-21 NOTE — Progress Notes (Signed)
[facility] [address], [place] [address] Phone: [phone] Subjective:    I'm seeing this patient by the request  of:  [name], MD   CC: Right shoulder pain  AOZ:[id number] [name] [name] is a 69 y.o. female coming in with complaint of right shoulder pain. Patient does have a past mental history significant for breast cancer that is in remission. Patient has been very active and attempting to lose weight. Patient though has noticed that she is started having decreasing range of motion and significantly increasing pain in the right shoulder. Patient states that it is becoming uncomfortable at night. Had to stop working out secondary to pain. Has noticed that she's had decreased range of motion such as lifting and above her head. Patient would state that this is secondary from pain and now weakness. Some mild radiation down the arm. No numbness. Rates the severity pain is 6 out of 10. Does not seem to be improving with over-the-counter medications. Does not rememberr injury.     Past Medical History  Diagnosis Date  . Hypertension   . Hyperlipidemia   . Anemia   . CAD (coronary artery disease)     a. Nonobstructive CAD 2007 (EF 75%.  RCA  proximal 75% tubular, 25% prox LAD, 25% d1, 50% D2) at [facility]. b. NSTEMI 01/2010 in setting of respiratory failure, medical approach (not a good candidate for noninvasive eval)  . OSA on CPAP   . Hypothyroidism   . Depression 06/13/2011  . Neuropathy ([place]) 06/13/2011  . Cervical radiculopathy 06/13/2011  . Gout 06/13/2011  . Obesity hypoventilation syndrome ([place]) 06/13/2011  . COPD (chronic obstructive pulmonary disease) ([place])     on O2  . Breast cancer ([place]) dx'd 2005    No BP check or stick in Left arm  . Cellulitis     hx of cellulitis in left leg  . Impaired glucose tolerance 11/01/2012  . PFO (patent foramen ovale)   . Atrial flutter ([place])   . Atrial fibrillation ([place])   . Syncope and collapse    Bradycardia with pauses. S/P pacemaker  . Risk for falls   . Asthma 06/13/2011    hx of  . Hx of dysfunctional uterine bleeding     last bleeding jan 2016, worked up for with d and c x 2 no cause found  . History of Clostridium difficile early dec 2015    no current diarrhea  . History of home oxygen therapy     2 liters per nasal cannula all the time  . Diverticulosis   . Tubular adenoma of colon   . Esophageal dysmotility    Past Surgical History  Procedure Laterality Date  . Ovarian cyst removal    . Breast lumpectomy Left   . Cervical disc surgery  2004 or 2005    have cadaver bones,, screws, and plates c 5 to c 6  . Cervical biopsy  June 2013    at [facility] for Heavy  Bleeding  . Dilation and curettage of uterus    . Colonoscopy    . Laparoscopic appendectomy  03/29/2012    Procedure: APPENDECTOMY LAPAROSCOPIC;  Surgeon: Adin Hector, MD;  Location: [facility] ORS;  Service: General;  Laterality: N/A;  . Tee without cardioversion N/A 03/15/2013    Procedure: TRANSESOPHAGEAL ECHOCARDIOGRAM (TEE);  Surgeon: Thayer Headings, MD;  Location: [place];  Service: Cardiovascular;  Laterality: N/A;  . Cardioversion N/A 03/15/2013    Procedure:  CARDIOVERSION;  Surgeon: Thayer Headings, MD;  Location: [facility] ENDOSCOPY;  Service: Cardiovascular;  Laterality: N/A;  . Loop recorder implant  08-28-2013; 08-31-2013    MDT LinQ implanted by Dr Rayann Heman for syncope; explanted 08-31-2013 after sinus pauses identified  . Pacemaker insertion  08-31-2013    Biotronik Evera dual chamber pacemaker placed for neurocardiogenic syncope and sinus pauses by Dr Rayann Heman  . Loop recorder implant N/A 08/28/2013    Procedure: LOOP RECORDER IMPLANT;  Surgeon: Coralyn Mark, MD;  Location: [place] CATH LAB;  Service: Cardiovascular;  Laterality: N/A;  . Permanent pacemaker insertion N/A 08/31/2013    Procedure: PERMANENT PACEMAKER INSERTION;  Surgeon: Coralyn Mark, MD;  Location: [place] CATH LAB;  Service: Cardiovascular;   Laterality: N/A;  . Loop recorder explant  08/31/2013    Procedure: LOOP RECORDER EXPLANT;  Surgeon: Coralyn Mark, MD;  Location: [place] CATH LAB;  Service: Cardiovascular;;  . Colonoscopy with propofol N/A 10/22/2014    Procedure: COLONOSCOPY WITH PROPOFOL;  Surgeon: Jerene Bears, MD;  Location: [facility] ENDOSCOPY;  Service: Gastroenterology;  Laterality: N/A;   Social History  Substance Use Topics  . Smoking status: Former Smoker -- 1.00 packs/day for 20 years    Types: Cigarettes    Quit date: 09/20/1998  . Smokeless tobacco: Never Used     Comment: 1ppd x 20 years  . Alcohol Use: 0.6 oz/week    1 Glasses of wine per week     Comment: rare   Allergies  Allergen Reactions  . Cephalexin Other (See Comments)    Exfoliative dermatitis reaction  . Codeine     REACTION: unknown - pt. states unaware of having reaction to codeine  . Dilaudid [Hydromorphone] Other (See Comments)    Other Reaction: Oversedation  . Lisinopril     REACTION: cough  . Penicillins     rash  . Cleocin [Clindamycin Hcl] Rash    rash   Family History  Problem Relation Age of Onset  . Uterine cancer Mother   . Heart disease Mother   . Breast cancer Sister 14  . Ovarian cancer Sister   . Diabetes Brother   . Breast cancer Maternal Aunt   . Breast cancer Cousin 27  . Ovarian cancer Sister      Past medical history, social, surgical and family history all reviewed in electronic medical record.   Review of Systems: No headache, visual changes, nausea, vomiting, diarrhea, constipation, dizziness, abdominal pain, skin rash, fevers, chills, night sweats, weight loss, swollen lymph nodes, body aches, joint swelling, muscle aches, chest pain, shortness of breath, mood changes.   Objective Blood pressure 116/80, pulse 92, height 5' 4"  (1.626 m), weight 308 lb (139.708 kg), SpO2 97 %.  General: No apparent distress alert and oriented x3 mood and affect normal, dressed appropriately.  HEENT: Pupils equal, extraocular  movements intact  Respiratory: Patient's speak in full sentences and does not appear short of breath  Cardiovascular: No lower extremity edema, non tender, no erythema  Skin: Warm dry intact with no signs of infection or rash on extremities or on axial skeleton.  Abdomen: Soft nontender  Neuro: Cranial nerves II through XII are intact, neurovascularly intact in all extremities with 2+ DTRs and 2+ pulses.  Lymph: No lymphadenopathy of posterior or anterior cervical chain or axillae bilaterally.  Gait normal with good balance and coordination.  MSK:  Non tender with full range of motion and good stability and symmetric strength and tone of  elbows, wrist, hip,  knee and ankles bilaterally.  Shoulder: left Inspection reveals no abnormalities, atrophy or asymmetry. Palpation is normal with no tenderness over AC joint or bicipital groove. Port flexion 160, internal rotation to lateral hip, external rotation of 5 4 out of 5 strength secondary to pain compared to contralateral side signs of impingement with positive Neer and Hawkin's tests, but negative empty can sign. Speeds and Yergason's tests normal. No labral pathology noted with negative Obrien's, negative clunk and good stability. Normal scapular function observed. No painful arc and no drop arm sign. No apprehension sign Contralateral shoulder unremarkable  MSK US performed of: left This study was ordered, performed, and interpreted by Charlann Boxer D.O.  Shoulder:   Supraspinatus:  Appears normal on long and transverse views, Bursal bulge seen with shoulder abduction on impingement view. Thickening of the  Infraspinatus:  Appears normal on long and transverse views. Significant increase in Doppler flow Subscapularis:  Appears normal on long and transverse views. Positive bursa thickening of the anterior capsule Teres Minor:  Appears normal on long and transverse views. AC joint:  Capsule undistended, no geyser sign. Glenohumeral Joint:   Appears normal without effusion. Glenoid Labrum:  Intact without visualized tears. Biceps Tendon:  Appears normal on long and transverse views, no fraying of tendon, tendon located in intertubercular groove, no subluxation with shoulder internal or external rotation.  Impression: Subacromial bursitis, adhesive capsulitis  Procedure: Real-time Ultrasound Guided Injection of left glenohumeral joint Device: GE Logiq E  Ultrasound guided injection is preferred based studies that show increased duration, increased effect, greater accuracy, decreased procedural pain, increased response rate with ultrasound guided versus blind injection.  Verbal informed consent obtained.  Time-out conducted.  Noted no overlying erythema, induration, or other signs of local infection.  Skin prepped in a sterile fashion.  Local anesthesia: Topical Ethyl chloride.  With sterile technique and under real time ultrasound guidance:  Joint visualized.  23g 1  inch needle inserted posterior approach. Pictures taken for needle placement. Patient did have injection of 2 cc of 1% lidocaine, 2 cc of 0.5% Marcaine, and 1.0 cc of Kenalog 40 mg/dL. Completed without difficulty  Pain immediately resolved suggesting accurate placement of the medication.  Advised to call if fevers/chills, erythema, induration, drainage, or persistent bleeding.  Images permanently stored and available for review in the ultrasound unit.  Impression: Technically successful ultrasound guided injection.   Procedure note 23536; 15 minutes spent for Therapeutic exercises as stated in above notes.  This included exercises focusing on stretching, strengthening, with significant focus on eccentric aspects. Basic scapular stabilization to include adduction and depression of scapula [place], focusing on proper movement and good control Internal and External rotation utilizing a theraband, with elbow tucked at side entire time Rows with theraband  Proper  technique shown and discussed handout in great detail with ATC.  All questions were discussed and answered.      Impression and Recommendations:     This case required medical decision making of moderate complexity.   '

## 2015-08-21 NOTE — Assessment & Plan Note (Signed)
Responded very well to injection today. Actually had increasing range of motion and strength immediately. Not full range of motion. Patient had exercises given to her by athletic trainer. We discussed icing regimen and vitamin D supplementation. Patient declined formal physical therapy. Patient will follow-up in 3-4 weeks for further evaluation and treatment.

## 2015-08-25 ENCOUNTER — Other Ambulatory Visit: Payer: Self-pay | Admitting: Internal Medicine

## 2015-08-26 ENCOUNTER — Encounter: Payer: Self-pay | Admitting: Adult Health

## 2015-08-26 DIAGNOSIS — G4733 Obstructive sleep apnea (adult) (pediatric): Secondary | ICD-10-CM

## 2015-09-02 ENCOUNTER — Other Ambulatory Visit: Payer: Self-pay | Admitting: Cardiology

## 2015-09-02 ENCOUNTER — Encounter: Payer: Self-pay | Admitting: Cardiology

## 2015-09-03 NOTE — Telephone Encounter (Signed)
Rx has been sent to the pharmacy electronically. ° °

## 2015-09-05 LAB — CUP PACEART REMOTE DEVICE CHECK
Brady Statistic RA Percent Paced: 20 %
Implantable Lead Location: 753859
Implantable Lead Location: 753860
Implantable Lead Model: 346367
Implantable Lead Model: 350974
Implantable Lead Serial Number: 29535412
Lead Channel Impedance Value: 894 Ohm
Lead Channel Sensing Intrinsic Amplitude: 3.1 mV
MDC IDC LEAD IMPLANT DT: 20141212
MDC IDC LEAD IMPLANT DT: 20141212
MDC IDC LEAD SERIAL: 29409274
MDC IDC MSMT LEADCHNL RA IMPEDANCE VALUE: 547 Ohm
MDC IDC MSMT LEADCHNL RV SENSING INTR AMPL: 10.1 mV
MDC IDC SESS DTM: 20161216145257
MDC IDC SET LEADCHNL RA PACING AMPLITUDE: 2 V
MDC IDC SET LEADCHNL RV PACING AMPLITUDE: 2.6 V
MDC IDC SET LEADCHNL RV PACING PULSEWIDTH: 0.75 ms
MDC IDC STAT BRADY RV PERCENT PACED: 0 %
Pulse Gen Serial Number: 68181424

## 2015-09-11 ENCOUNTER — Ambulatory Visit: Payer: Medicare Other | Admitting: Family Medicine

## 2015-09-16 ENCOUNTER — Encounter: Payer: Self-pay | Admitting: Cardiology

## 2015-09-18 ENCOUNTER — Encounter: Payer: Self-pay | Admitting: Cardiology

## 2015-09-27 ENCOUNTER — Other Ambulatory Visit: Payer: Self-pay | Admitting: Internal Medicine

## 2015-11-06 ENCOUNTER — Ambulatory Visit: Payer: Medicare Other | Admitting: Internal Medicine

## 2015-11-18 ENCOUNTER — Telehealth: Payer: Self-pay | Admitting: Cardiology

## 2015-11-18 ENCOUNTER — Encounter: Payer: Medicare Other | Admitting: *Deleted

## 2015-11-18 NOTE — Telephone Encounter (Signed)
LMOVM reminding pt to send remote transmission.   

## 2015-11-19 ENCOUNTER — Encounter: Payer: Self-pay | Admitting: Cardiology

## 2015-11-24 ENCOUNTER — Other Ambulatory Visit: Payer: Self-pay | Admitting: Internal Medicine

## 2015-11-24 ENCOUNTER — Encounter: Payer: Self-pay | Admitting: Internal Medicine

## 2015-11-24 DIAGNOSIS — N6489 Other specified disorders of breast: Secondary | ICD-10-CM

## 2015-11-24 DIAGNOSIS — R921 Mammographic calcification found on diagnostic imaging of breast: Secondary | ICD-10-CM

## 2015-12-02 ENCOUNTER — Ambulatory Visit
Admission: RE | Admit: 2015-12-02 | Discharge: 2015-12-02 | Disposition: A | Discharge status: Home / Self Care | Payer: Medicare Other | Source: Ambulatory Visit | Attending: Internal Medicine | Admitting: Internal Medicine

## 2015-12-02 ENCOUNTER — Other Ambulatory Visit: Payer: Self-pay | Admitting: Internal Medicine

## 2015-12-02 DIAGNOSIS — R921 Mammographic calcification found on diagnostic imaging of breast: Secondary | ICD-10-CM

## 2015-12-02 DIAGNOSIS — N6489 Other specified disorders of breast: Secondary | ICD-10-CM

## 2015-12-09 ENCOUNTER — Encounter: Payer: Self-pay | Admitting: Cardiology

## 2015-12-09 ENCOUNTER — Encounter: Payer: Self-pay | Admitting: Internal Medicine

## 2015-12-09 ENCOUNTER — Encounter: Payer: Self-pay | Admitting: Hematology

## 2015-12-09 ENCOUNTER — Encounter: Payer: Self-pay | Admitting: Pulmonary Disease

## 2015-12-09 NOTE — Telephone Encounter (Signed)
Dear Doctor Elsworth Soho,     I am [name] [name] DOB May 10, 1946. I had a brother pass on October 30, 2015. He had a will that named me as the executor of his estate. I live in [place] and he lived and died in [place], [place].    Family members removed his important papers before I got to [place]. They told me that he did not have a will.     Evidently they thought they could bypass me. When that did not work they took his papers to a Chief Executive Officer.     I received a letter from the lawyer stating that there was indeed a will and I am the executor.. When I saw you last my numbers were great, I no longer need oxygen and I have lost about 65 lbs. and feel great. I learned today other family got letters also telling them to replace me because I am in poor physical and mental health. Please write them a letter telling the state of my health. I am requesting letters from all my doctors before I respond to the lawyer . Thank you for saving my life so I can enjoy kicking their butts.    RA please advise if you're ok with writing a letter for pt describing her current state of health.  Thanks.

## 2015-12-09 NOTE — Telephone Encounter (Signed)
Done hardcopy to Corinne  

## 2015-12-10 ENCOUNTER — Encounter: Payer: Self-pay | Admitting: Internal Medicine

## 2015-12-10 ENCOUNTER — Encounter: Payer: Self-pay | Admitting: *Deleted

## 2015-12-10 ENCOUNTER — Telehealth: Payer: Self-pay | Admitting: *Deleted

## 2015-12-10 NOTE — Telephone Encounter (Signed)
Done again hardcopy to corinne to send to lawyer this time, thanks

## 2015-12-10 NOTE — Telephone Encounter (Signed)
Spoke with pt and informed pt that a letter will be left at front desk with [facility] for pt to pick up today before 4 pm.  Pt voiced understanding.

## 2015-12-11 ENCOUNTER — Other Ambulatory Visit: Payer: Self-pay

## 2015-12-11 ENCOUNTER — Telehealth: Payer: Self-pay | Admitting: Internal Medicine

## 2015-12-11 ENCOUNTER — Encounter: Payer: Self-pay | Admitting: Internal Medicine

## 2015-12-11 MED ORDER — ALBUTEROL SULFATE HFA 108 (90 BASE) MCG/ACT IN AERS: 2.0000 | INHALATION_SPRAY | Freq: Four times a day (QID) | RESPIRATORY_TRACT | Status: DC | PRN

## 2015-12-11 NOTE — Telephone Encounter (Signed)
Please follow up with patient in regards to letters Dr. Jenny Reichmann is to write

## 2015-12-16 NOTE — Telephone Encounter (Signed)
Letter completed and left at front for patient to pick up. Patient notified via voicemail and Via myChart that letter is ready for pick up. Nothing further needed.

## 2015-12-29 ENCOUNTER — Other Ambulatory Visit: Payer: Self-pay | Admitting: Cardiology

## 2015-12-29 ENCOUNTER — Other Ambulatory Visit: Payer: Self-pay | Admitting: Internal Medicine

## 2015-12-29 MED ORDER — POTASSIUM CHLORIDE CRYS ER 20 MEQ PO TBCR: 20.0000 meq | EXTENDED_RELEASE_TABLET | Freq: Two times a day (BID) | ORAL | Status: DC

## 2015-12-29 MED ORDER — DULOXETINE HCL 30 MG PO CPEP: 30.0000 mg | ORAL_CAPSULE | Freq: Two times a day (BID) | ORAL | Status: DC

## 2015-12-29 NOTE — Telephone Encounter (Signed)
Pt left msg on triage stating she is needing refill on cymbalta & [facility], and a 30 day to local until she received from mail order..Per chart cardiology [facility] this am will send 30 day to local.../lmb

## 2015-12-29 NOTE — Telephone Encounter (Signed)
REFILL 

## 2016-02-12 ENCOUNTER — Ambulatory Visit (INDEPENDENT_AMBULATORY_CARE_PROVIDER_SITE_OTHER): Payer: Medicare Other | Admitting: Adult Health

## 2016-02-12 ENCOUNTER — Encounter: Payer: Self-pay | Admitting: Adult Health

## 2016-02-12 VITALS — BP 122/66 | HR 80 | Temp 99.0°F | Ht 65.0 in | Wt 321.0 lb

## 2016-02-12 DIAGNOSIS — Z6841 Body Mass Index (BMI) 40.0 and over, adult: Secondary | ICD-10-CM

## 2016-02-12 DIAGNOSIS — E662 Morbid (severe) obesity with alveolar hypoventilation: Secondary | ICD-10-CM | POA: Diagnosis not present

## 2016-02-12 DIAGNOSIS — G4733 Obstructive sleep apnea (adult) (pediatric): Secondary | ICD-10-CM

## 2016-02-12 NOTE — Assessment & Plan Note (Signed)
Wt loss  

## 2016-02-12 NOTE — Patient Instructions (Signed)
Continue on CPAP At bedtime  -keep up good work  Work on weight loss.  Follow up Dr. Elsworth Soho  In 1 year and As needed

## 2016-02-12 NOTE — Progress Notes (Signed)
Reviewed & agree with plan  

## 2016-02-12 NOTE — Progress Notes (Signed)
Subjective:    Patient ID: [name] [name], female    DOB: 25-Oct-1945, 70 y.o.   MRN: [id number]  HPI PCP - Jenny Reichmann   70 year old morbidly obese female ex smoker (quit 2000) for FU of copd & OSA on 10 cm.  PMH -sleep apnea, hypertension,breast cancer with left lumpectomy and hypothyroidism , dCHF , PFO    TEST :   Duplex neg DVT, CT angio neg PE, echo >> nml LV fn, mod LVH.   PFTs showed moderate airway obstruction FEV1 48%, no BD response, mild restriction, DLCO corrects for alveolar volume c/w obesity  PSG 03/30/10 surprisingly showed AHI 0.7/h & RDI 9/h, 36 RERAs over 249 mins TST, lowest desatn 92% on 2 L [place]  July 2011 -ONO showed significant desaturation for > 4h  ECHO TEE >EF ok, PFO +bubble   02/12/2016 Follow up : OSA /OHS  Pt returns for 9 month follow up .  She says she feels best she has felt in long time . Has new granddaughter. Very happy.  Wearing CPAP At bedtime  , wears for 8hr each night.  Feels rested.  Download shows excellent compliance with average usage at 8.75 hours. She is on a set pressure at 10 cm of H2O. AHI 0.5. Leaks. Minimum. He denies any chest pain, orthopnea, PND, or increased leg swelling  Past Medical History  Diagnosis Date  . Hypertension   . Hyperlipidemia   . Anemia   . CAD (coronary artery disease)     a. Nonobstructive CAD 2007 (EF 75%.  RCA  proximal 75% tubular, 25% prox LAD, 25% d1, 50% D2) at [facility]. b. NSTEMI 01/2010 in setting of respiratory failure, medical approach (not a good candidate for noninvasive eval)  . OSA on CPAP   . Hypothyroidism   . Depression 06/13/2011  . Neuropathy ([place]) 06/13/2011  . Cervical radiculopathy 06/13/2011  . Gout 06/13/2011  . Obesity hypoventilation syndrome ([place]) 06/13/2011  . COPD (chronic obstructive pulmonary disease) ([place])     on O2  . Breast cancer ([place]) dx'd 2005    No BP check or stick in Left arm  . Cellulitis     hx of cellulitis in left leg  . Impaired glucose tolerance 11/01/2012  . PFO (patent  foramen ovale)   . Atrial flutter ([place])   . Atrial fibrillation ([place])   . Syncope and collapse     Bradycardia with pauses. S/P pacemaker  . Risk for falls   . Asthma 06/13/2011    hx of  . Hx of dysfunctional uterine bleeding     last bleeding jan 2016, worked up for with d and c x 2 no cause found  . History of Clostridium difficile early dec 2015    no current diarrhea  . History of home oxygen therapy     2 liters per nasal cannula all the time  . Diverticulosis   . Tubular adenoma of colon   . Esophageal dysmotility    . Current Outpatient Prescriptions on File Prior to Visit  Medication Sig Dispense Refill  . albuterol (PROVENTIL HFA;VENTOLIN HFA) 108 (90 Base) MCG/ACT inhaler Inhale 2 puffs into the lungs every 6 (six) hours as needed for wheezing or shortness of breath. 18 g 1  . allopurinol (ZYLOPRIM) 300 MG tablet Take 300 mg by mouth daily.    [name] amitriptyline (ELAVIL) 100 MG tablet take 1 tablet by mouth at bedtime if needed for sleep 90 tablet 1  . apixaban (ELIQUIS) 5 MG TABS tablet  Take 5 mg by mouth 2 (two) times daily.    . Cholecalciferol (VITAMIN D) 2000 UNITS tablet Take 2,000 Units by mouth at bedtime.    . diclofenac sodium (VOLTAREN) 1 % GEL Apply 4 g topically 4 (four) times daily as needed. 400 g 5  . DULoxetine (CYMBALTA) 30 MG capsule Take 1 capsule (30 mg total) by mouth 2 (two) times daily. Enough until receive mail order 30 capsule 0  . ELIQUIS 5 MG TABS tablet TAKE 1 TABLET TWICE A DAY 180 tablet 3  . ferrous sulfate 325 (65 FE) MG tablet Take 325 mg by mouth daily with breakfast.    . furosemide (LASIX) 40 MG tablet TAKE 1 TABLET EVERY MORNING AND ONE TABLET EVERY OTHER NIGHT AS DIRECTED 90 tablet 3  . levothyroxine (SYNTHROID, LEVOTHROID) 200 MCG tablet TAKE 1 TABLET DAILY 90 tablet 3  . Omega-3 Fatty Acids (FISH OIL PO) Take 1 capsule by mouth daily.     [name] omeprazole (PRILOSEC) 40 MG capsule Take 1 capsule (40 mg total) by mouth daily. 90 capsule 3  .  potassium chloride SA (K-DUR,KLOR-CON) 20 MEQ tablet Take 1 tablet (20 mEq total) by mouth 2 (two) times daily. 60 tablet 0  . pravastatin (PRAVACHOL) 40 MG tablet TAKE 1 TABLET DAILY 90 tablet 3  . traMADol (ULTRAM) 50 MG tablet Take 1 tablet (50 mg total) by mouth every 8 (eight) hours as needed. 90 tablet 2  . vitamin C (ASCORBIC ACID) 500 MG tablet Take 500 mg by mouth daily.     . metoprolol tartrate (LOPRESSOR) 25 MG tablet Take 25 mg by mouth 2 (two) times daily. Reported on 02/12/2016     No current facility-administered medications on file prior to visit.       Review of Systems neg for any significant sore throat, dysphagia, itching, sneezing, nasal congestion or excess/ purulent secretions, fever, chills, sweats, unintended wt loss, pleuritic or exertional cp, hempoptysis, orthopnea pnd or change in chronic leg swelling. Also denies presyncope, palpitations, heartburn, abdominal pain, nausea, vomiting, diarrhea or change in bowel or urinary habits, dysuria,hematuria, rash, arthralgias, visual complaints, headache, numbness weakness or ataxia.     Objective:   Physical Exam Filed Vitals:   02/12/16 1552  BP: 122/66  Pulse: 80  Temp: 99 F (37.2 C)  TempSrc: Oral  Height: 5' 5"  (1.651 m)  Weight: 321 lb (145.605 kg)  SpO2: 93%    [place]. [place], morbidly obese, in no distress, walks with a pink  walker ENT - no lesions, no post nasal drip, class 3 MP airway  Neck: No JVD, no thyromegaly, no carotid bruits Lungs: no use of accessory muscles, no dullness to percussion, decreased without rales or rhonchi  Cardiovascular: Rhythm regular, heart sounds  normal, no murmurs or gallops, trace peripheral edema Musculoskeletal: No deformities, no cyanosis or clubbing , no tremors   Tammy Parrett NP-C  [facility]  02/12/2016     Assessment & Plan:

## 2016-02-12 NOTE — Assessment & Plan Note (Signed)
OHS with hypercarbia , clinically improved with CPAP .  Cont on CPAP .   Plan  Continue on CPAP At bedtime  -keep up good work  Work on weight loss.  Follow up Dr. Elsworth Soho  In 1 year and As needed

## 2016-02-19 ENCOUNTER — Other Ambulatory Visit: Payer: Self-pay

## 2016-02-19 MED ORDER — AMITRIPTYLINE HCL 100 MG PO TABS: ORAL_TABLET | ORAL | Status: DC

## 2016-02-20 ENCOUNTER — Other Ambulatory Visit: Payer: Self-pay | Admitting: Internal Medicine

## 2016-02-27 ENCOUNTER — Other Ambulatory Visit: Payer: Self-pay | Admitting: Internal Medicine

## 2016-03-10 ENCOUNTER — Encounter: Payer: Self-pay | Admitting: Internal Medicine

## 2016-03-10 DIAGNOSIS — I10 Essential (primary) hypertension: Secondary | ICD-10-CM

## 2016-03-10 DIAGNOSIS — Z950 Presence of cardiac pacemaker: Secondary | ICD-10-CM

## 2016-03-10 MED ORDER — TRAMADOL HCL 50 MG PO TABS: 50.0000 mg | ORAL_TABLET | Freq: Three times a day (TID) | ORAL | Status: DC | PRN

## 2016-03-10 MED ORDER — METOPROLOL TARTRATE 25 MG PO TABS: 25.0000 mg | ORAL_TABLET | Freq: Two times a day (BID) | ORAL | Status: DC

## 2016-03-10 NOTE — Telephone Encounter (Signed)
Done hardcopy to Corinne  

## 2016-04-04 ENCOUNTER — Other Ambulatory Visit: Payer: Self-pay | Admitting: Cardiology

## 2016-04-05 NOTE — Telephone Encounter (Signed)
Rx(s) sent to pharmacy electronically.  

## 2016-04-10 ENCOUNTER — Encounter (HOSPITAL_COMMUNITY): Payer: Self-pay

## 2016-04-10 ENCOUNTER — Observation Stay (HOSPITAL_COMMUNITY)
Admission: EM | Admit: 2016-04-10 | Discharge: 2016-04-12 | Disposition: A | Discharge status: Home / Self Care | Payer: Medicare Other | Attending: Internal Medicine | Admitting: Internal Medicine

## 2016-04-10 ENCOUNTER — Emergency Department (HOSPITAL_COMMUNITY): Payer: Medicare Other

## 2016-04-10 DIAGNOSIS — K219 Gastro-esophageal reflux disease without esophagitis: Secondary | ICD-10-CM | POA: Diagnosis not present

## 2016-04-10 DIAGNOSIS — R600 Localized edema: Secondary | ICD-10-CM | POA: Insufficient documentation

## 2016-04-10 DIAGNOSIS — E039 Hypothyroidism, unspecified: Secondary | ICD-10-CM | POA: Diagnosis not present

## 2016-04-10 DIAGNOSIS — I252 Old myocardial infarction: Secondary | ICD-10-CM | POA: Diagnosis not present

## 2016-04-10 DIAGNOSIS — R0789 Other chest pain: Principal | ICD-10-CM | POA: Insufficient documentation

## 2016-04-10 DIAGNOSIS — I517 Cardiomegaly: Secondary | ICD-10-CM | POA: Insufficient documentation

## 2016-04-10 DIAGNOSIS — E785 Hyperlipidemia, unspecified: Secondary | ICD-10-CM | POA: Diagnosis not present

## 2016-04-10 DIAGNOSIS — I2510 Atherosclerotic heart disease of native coronary artery without angina pectoris: Secondary | ICD-10-CM | POA: Diagnosis not present

## 2016-04-10 DIAGNOSIS — Z950 Presence of cardiac pacemaker: Secondary | ICD-10-CM | POA: Insufficient documentation

## 2016-04-10 DIAGNOSIS — I4891 Unspecified atrial fibrillation: Secondary | ICD-10-CM | POA: Insufficient documentation

## 2016-04-10 DIAGNOSIS — R0989 Other specified symptoms and signs involving the circulatory and respiratory systems: Secondary | ICD-10-CM | POA: Diagnosis not present

## 2016-04-10 DIAGNOSIS — N390 Urinary tract infection, site not specified: Secondary | ICD-10-CM | POA: Insufficient documentation

## 2016-04-10 DIAGNOSIS — J449 Chronic obstructive pulmonary disease, unspecified: Secondary | ICD-10-CM | POA: Diagnosis not present

## 2016-04-10 DIAGNOSIS — Z6841 Body Mass Index (BMI) 40.0 and over, adult: Secondary | ICD-10-CM | POA: Diagnosis not present

## 2016-04-10 DIAGNOSIS — I10 Essential (primary) hypertension: Secondary | ICD-10-CM | POA: Diagnosis present

## 2016-04-10 DIAGNOSIS — Z9981 Dependence on supplemental oxygen: Secondary | ICD-10-CM | POA: Insufficient documentation

## 2016-04-10 DIAGNOSIS — Z7901 Long term (current) use of anticoagulants: Secondary | ICD-10-CM | POA: Diagnosis not present

## 2016-04-10 DIAGNOSIS — Z87891 Personal history of nicotine dependence: Secondary | ICD-10-CM | POA: Diagnosis not present

## 2016-04-10 DIAGNOSIS — I248 Other forms of acute ischemic heart disease: Secondary | ICD-10-CM | POA: Insufficient documentation

## 2016-04-10 DIAGNOSIS — R079 Chest pain, unspecified: Secondary | ICD-10-CM | POA: Diagnosis present

## 2016-04-10 DIAGNOSIS — G4733 Obstructive sleep apnea (adult) (pediatric): Secondary | ICD-10-CM | POA: Diagnosis not present

## 2016-04-10 DIAGNOSIS — G4730 Sleep apnea, unspecified: Secondary | ICD-10-CM | POA: Diagnosis present

## 2016-04-10 DIAGNOSIS — Z853 Personal history of malignant neoplasm of breast: Secondary | ICD-10-CM | POA: Diagnosis not present

## 2016-04-10 DIAGNOSIS — I4892 Unspecified atrial flutter: Secondary | ICD-10-CM | POA: Diagnosis not present

## 2016-04-10 DIAGNOSIS — Z880 Allergy status to penicillin: Secondary | ICD-10-CM | POA: Diagnosis not present

## 2016-04-10 LAB — BASIC METABOLIC PANEL
Anion gap: 8 (ref 5–15)
BUN: 7 mg/dL (ref 6–20)
CALCIUM: 9.3 mg/dL (ref 8.9–10.3)
CHLORIDE: 106 mmol/L (ref 101–111)
CO2: 28 mmol/L (ref 22–32)
CREATININE: 1.03 mg/dL — AB (ref 0.44–1.00)
GFR calc Af Amer: >60 mL/min (ref >60)
GFR calc non Af Amer: 54 mL/min — ABNORMAL LOW (ref >60)
GLUCOSE: 105 mg/dL — AB (ref 65–99)
Potassium: 4.2 mmol/L (ref 3.5–5.1)
Sodium: 142 mmol/L (ref 135–145)

## 2016-04-10 LAB — I-STAT TROPONIN, ED
TROPONIN I, POC: 0.01 ng/mL (ref 0.00–0.08)
TROPONIN I, POC: 0.01 ng/mL (ref 0.00–0.08)

## 2016-04-10 LAB — CBC
HCT: 42.7 % (ref 36.0–46.0)
Hemoglobin: 13.2 g/dL (ref 12.0–15.0)
MCH: 28.5 pg (ref 26.0–34.0)
MCHC: 30.9 g/dL (ref 30.0–36.0)
MCV: 92.2 fL (ref 78.0–100.0)
PLATELETS: 200 10*3/uL (ref 150–400)
RBC: 4.63 MIL/uL (ref 3.87–5.11)
RDW: 13.8 % (ref 11.5–15.5)
WBC: 6.2 10*3/uL (ref 4.0–10.5)

## 2016-04-10 LAB — BRAIN NATRIURETIC PEPTIDE: B Natriuretic Peptide: 91.7 pg/mL (ref 0.0–100.0)

## 2016-04-10 MED ORDER — ASPIRIN 81 MG PO CHEW
324.0000 mg | CHEWABLE_TABLET | Freq: Once | ORAL | Status: AC
Start: 2016-04-10 — End: 2016-04-10
  Administered 2016-04-10: 324 mg via ORAL
  Filled 2016-04-10: qty 4

## 2016-04-10 NOTE — ED Provider Notes (Signed)
CSN: [id number]     Arrival date & time 04/10/16  1732 History   First MD Initiated Contact with Patient 04/10/16 2050     Chief Complaint  Patient presents with  . Chest Pain     (Consider location/radiation/quality/duration/timing/severity/associated sxs/prior Treatment) HPI Comments: 70 year old female with past medical history including CAD, COPD, atrial fibrillation, hypertension, hyperlipidemia who presents with chest pain. Earlier today, the patient began having left-sided chest pain that she states was a heaviness/pressure under her left breast radiating to her back. The pain began before eating lunch but she does note that it was slightly worse after lunch, which made her think it might be reflux. She also notes that she became diaphoretic later. She denies any significant shortness of breath, no radiation of the pain, and no nausea/vomiting. She states that she has never had this pain before. She states she is compliant with Eliquis. No sudden ripping or tearing chest pain. No fevers or recent illness. She does note that she has been under a lot of stress recently related to her brother's death.  Patient is a 70 y.o. female presenting with chest pain. The history is provided by the patient.  Chest Pain   Past Medical History  Diagnosis Date  . Hypertension   . Hyperlipidemia   . Anemia   . CAD (coronary artery disease)     a. Nonobstructive CAD 2007 (EF 75%.  RCA  proximal 75% tubular, 25% prox LAD, 25% d1, 50% D2) at [facility]. b. NSTEMI 01/2010 in setting of respiratory failure, medical approach (not a good candidate for noninvasive eval)  . OSA on CPAP   . Hypothyroidism   . Depression 06/13/2011  . Neuropathy ([place]) 06/13/2011  . Cervical radiculopathy 06/13/2011  . Gout 06/13/2011  . Obesity hypoventilation syndrome ([place]) 06/13/2011  . COPD (chronic obstructive pulmonary disease) ([place])     on O2  . Breast cancer ([place]) dx'd 2005    No BP check or stick in Left arm  . Cellulitis      hx of cellulitis in left leg  . Impaired glucose tolerance 11/01/2012  . PFO (patent foramen ovale)   . Atrial flutter ([place])   . Atrial fibrillation ([place])   . Syncope and collapse     Bradycardia with pauses. S/P pacemaker  . Risk for falls   . Asthma 06/13/2011    hx of  . Hx of dysfunctional uterine bleeding     last bleeding jan 2016, worked up for with d and c x 2 no cause found  . History of Clostridium difficile early dec 2015    no current diarrhea  . History of home oxygen therapy     2 liters per nasal cannula all the time  . Diverticulosis   . Tubular adenoma of colon   . Esophageal dysmotility    Past Surgical History  Procedure Laterality Date  . Ovarian cyst removal    . Breast lumpectomy Left   . Cervical disc surgery  2004 or 2005    have cadaver bones,, screws, and plates c 5 to c 6  . Cervical biopsy  June 2013    at [facility] for Heavy  Bleeding  . Dilation and curettage of uterus    . Colonoscopy    . Laparoscopic appendectomy  03/29/2012    Procedure: APPENDECTOMY LAPAROSCOPIC;  Surgeon: Adin Hector, MD;  Location: [facility] OR[facility];  Service: General;  Laterality: N/A;  . Tee without cardioversion N/A 03/15/2013    Procedure: TRANSESOPHAGEAL  ECHOCARDIOGRAM (TEE);  Surgeon: Thayer Headings, MD;  Location: [place];  Service: Cardiovascular;  Laterality: N/A;  . Cardioversion N/A 03/15/2013    Procedure: CARDIOVERSION;  Surgeon: Thayer Headings, MD;  Location: [facility] ENDOSCOPY;  Service: Cardiovascular;  Laterality: N/A;  . Loop recorder implant  08-28-2013; 08-31-2013    MDT LinQ implanted by Dr Rayann Heman for syncope; explanted 08-31-2013 after sinus pauses identified  . Pacemaker insertion  08-31-2013    Biotronik Evera dual chamber pacemaker placed for neurocardiogenic syncope and sinus pauses by Dr Rayann Heman  . Loop recorder implant N/A 08/28/2013    Procedure: LOOP RECORDER IMPLANT;  Surgeon: Coralyn Mark, MD;  Location: [place] CATH LAB;  Service: Cardiovascular;   Laterality: N/A;  . Permanent pacemaker insertion N/A 08/31/2013    Procedure: PERMANENT PACEMAKER INSERTION;  Surgeon: Coralyn Mark, MD;  Location: [place] CATH LAB;  Service: Cardiovascular;  Laterality: N/A;  . Loop recorder explant  08/31/2013    Procedure: LOOP RECORDER EXPLANT;  Surgeon: Coralyn Mark, MD;  Location: [place] CATH LAB;  Service: Cardiovascular;;  . Colonoscopy with propofol N/A 10/22/2014    Procedure: COLONOSCOPY WITH PROPOFOL;  Surgeon: Jerene Bears, MD;  Location: [facility] ENDOSCOPY;  Service: Gastroenterology;  Laterality: N/A;   Family History  Problem Relation Age of Onset  . Uterine cancer Mother   . Heart disease Mother   . Breast cancer Sister 55  . Ovarian cancer Sister   . Diabetes Brother   . Breast cancer Maternal Aunt   . Breast cancer Cousin 74  . Ovarian cancer Sister    Social History  Substance Use Topics  . Smoking status: Former Smoker -- 1.00 packs/day for 20 years    Types: Cigarettes    Quit date: 09/20/1998  . Smokeless tobacco: Never Used     Comment: 1ppd x 20 years  . Alcohol Use: 0.6 oz/week    1 Glasses of wine per week     Comment: rare   OB History    No data available     Review of Systems  Cardiovascular: Positive for chest pain.   10 Systems reviewed and are negative for acute change except as noted in the HPI.    Allergies  Cephalexin; Codeine; Dilaudid; Lisinopril; Penicillins; and Cleocin  Home Medications   Prior to Admission medications   Medication Sig Start Date End Date Taking? Authorizing Provider  albuterol (PROVENTIL HFA;VENTOLIN HFA) 108 (90 Base) MCG/ACT inhaler Inhale 2 puffs into the lungs every 6 (six) hours as needed for wheezing or shortness of breath. 12/11/15  Yes Biagio Borg, MD  allopurinol (ZYLOPRIM) 300 MG tablet TAKE 1 TABLET DAILY 02/20/16  Yes Biagio Borg, MD  amitriptyline (ELAVIL) 100 MG tablet take 1 tablet by mouth at bedtime if needed for sleep 02/19/16  Yes Biagio Borg, MD  Cholecalciferol  (VITAMIN D) 2000 UNITS tablet Take 2,000 Units by mouth at bedtime.   Yes Historical Provider, MD  diclofenac sodium (VOLTAREN) 1 % GEL Apply 4 g topically 4 (four) times daily as needed. 07/16/15  Yes Biagio Borg, MD  DULoxetine (CYMBALTA) 30 MG capsule Take 1 capsule (30 mg total) by mouth 2 (two) times daily. Enough until receive mail order 12/29/15  Yes Biagio Borg, MD  ELIQUIS 5 MG TABS tablet TAKE 1 TABLET TWICE A DAY 08/25/15  Yes Biagio Borg, MD  ferrous sulfate 325 (65 FE) MG tablet Take 325 mg by mouth daily with breakfast.   Yes  Historical Provider, MD  furosemide (LASIX) 40 MG tablet Take 1 tablet every morning & 1 tablet every other night 02/27/16  Yes Biagio Borg, MD  levothyroxine (SYNTHROID, LEVOTHROID) 200 MCG tablet TAKE 1 TABLET DAILY 08/25/15  Yes Biagio Borg, MD  metoprolol tartrate (LOPRESSOR) 25 MG tablet Take 1 tablet (25 mg total) by mouth 2 (two) times daily. Reported on 02/12/2016 03/10/16  Yes Biagio Borg, MD  Omega-3 Fatty Acids (FISH OIL PO) Take 1 capsule by mouth daily.    Yes Historical Provider, MD  omeprazole (PRILOSEC) 40 MG capsule Take 1 capsule (40 mg total) by mouth daily. 07/16/15  Yes Jerene Bears, MD  potassium chloride SA (K-DUR,KLOR-CON) 20 MEQ tablet Take 1 tablet (20 mEq total) by mouth 2 (two) times daily. 12/29/15  Yes Biagio Borg, MD  pravastatin (PRAVACHOL) 40 MG tablet Take 1 tablet (40 mg total) by mouth daily. 04/05/16  Yes Lelon Perla, MD  traMADol (ULTRAM) 50 MG tablet Take 1 tablet (50 mg total) by mouth every 8 (eight) hours as needed. 03/10/16  Yes Biagio Borg, MD  vitamin C (ASCORBIC ACID) 500 MG tablet Take 500 mg by mouth daily.    Yes Historical Provider, MD   BP 130/60 mmHg  Pulse 88  Temp(Src) 98.9 F (37.2 C) (Oral)  Resp 18  Ht 5' 4"  (1.626 m)  Wt 321 lb (145.605 kg)  BMI 55.07 kg/m2  SpO2 99% Physical Exam  Constitutional: She is oriented to person, place, and time. She appears well-developed and well-nourished. No  distress.  HENT:  Head: Normocephalic and atraumatic.  Moist mucous membranes  Eyes: Conjunctivae are normal. Pupils are equal, round, and reactive to light.  Neck: Neck supple.  Cardiovascular: Normal rate, regular rhythm and normal heart sounds.   No murmur heard. Pulmonary/Chest: Effort normal and breath sounds normal.  Abdominal: Soft. Bowel sounds are normal. She exhibits no distension. There is no tenderness.  Musculoskeletal: She exhibits edema (mild BLE).  Neurological: She is alert and oriented to person, place, and time.  Fluent speech  Skin: Skin is warm and dry.  Psychiatric: She has a normal mood and affect. Judgment normal.  Nursing note and vitals reviewed.   ED Course  Procedures (including critical care time) Labs Review Labs Reviewed  BASIC METABOLIC PANEL - Abnormal; Notable for the following:    Glucose, Bld 105 (*)    Creatinine, Ser 1.03 (*)    GFR calc non Af Amer 54 (*)    All other components within normal limits  CBC  BRAIN NATRIURETIC PEPTIDE  I-STAT TROPOININ, ED  Randolm Idol, ED    Imaging Review Dg Chest 2 View  04/10/2016  CLINICAL DATA:  Left-sided chest pain extending through the back. EXAM: CHEST  2 VIEW COMPARISON:  Chest radiographs 04/29/2015 and CT 04/30/2015 FINDINGS: Dual lead pacemaker remains in place. The cardiac silhouette is mildly enlarged. Mild tortuosity of the thoracic aorta is unchanged. The lungs remain hyperinflated. Mild pulmonary vascular congestion is similar to the prior study. There is no evidence of lobar consolidation, overt alveolar edema, pleural effusion, or pneumothorax. Degenerative changes are noted about the shoulders. IMPRESSION: Cardiomegaly and mild pulmonary vascular congestion. Electronically Signed   By: Logan Bores M.D.   On: 04/10/2016 18:24   I have personally reviewed and evaluated these lab results as part of my medical decision-making.   EKG Interpretation   Date/Time:  Saturday April 10 2016  17:34:25 EDT Ventricular Rate:  94  PR Interval:  166 QRS Duration: 78 QT Interval:  350 QTC Calculation: 437 R Axis:   70 Text Interpretation:  Normal sinus rhythm Normal ECG No significant change  since last tracing Confirmed by Alyene Predmore MD, Deandrea Vanpelt ([id number]) on 04/10/2016  9:14:34 PM     Medications  aspirin chewable tablet 324 mg (324 mg Oral Given 04/10/16 2237)    MDM   Final diagnoses:  Chest pain, unspecified chest pain type   Patient with history of CAD presents with left-sided chest pain that began earlier today. On exam, she was pleasant, awake and alert, in no acute distress. Vital signs notable for mild hypertension. EKG unchanged from previous. Gave the patient aspirin and obtained above lab work as well as chest x-ray. Chest x-ray notes cardiomegaly and mild pulmonary vascular congestion. Patient denies any significant change in her lower semi-edema or orthopnea. BNP is normal. Labwork shows negative serial troponins, creatinine 1.03, unremarkable CBC. The patient is compliant with her anticoagulant therefore I feel PE is very unlikely. No widened mediastinum or sudden chest pain to suggest aortic dissection. I discussed with cardiology, Frann Rider, who recommended medicine admission for CP rule out. Admitted to Dr. Roel Cluck, triad, for chest pain rule out.   Sharlett Iles, MD 04/11/16 [phone]

## 2016-04-10 NOTE — ED Notes (Signed)
Patient here with chest pain that developed after lunch and states that it radiates through to her back, states that she also broke out in a sweat as she prepared to go shopping. Has a pacemaker and appears anxious on arrival.

## 2016-04-11 ENCOUNTER — Encounter (HOSPITAL_COMMUNITY): Payer: Self-pay | Admitting: Internal Medicine

## 2016-04-11 DIAGNOSIS — I10 Essential (primary) hypertension: Secondary | ICD-10-CM | POA: Diagnosis not present

## 2016-04-11 DIAGNOSIS — E785 Hyperlipidemia, unspecified: Secondary | ICD-10-CM

## 2016-04-11 DIAGNOSIS — R079 Chest pain, unspecified: Secondary | ICD-10-CM

## 2016-04-11 DIAGNOSIS — I480 Paroxysmal atrial fibrillation: Secondary | ICD-10-CM | POA: Diagnosis not present

## 2016-04-11 DIAGNOSIS — R0789 Other chest pain: Secondary | ICD-10-CM | POA: Diagnosis not present

## 2016-04-11 DIAGNOSIS — Z7901 Long term (current) use of anticoagulants: Secondary | ICD-10-CM | POA: Diagnosis not present

## 2016-04-11 DIAGNOSIS — G4730 Sleep apnea, unspecified: Secondary | ICD-10-CM

## 2016-04-11 LAB — TROPONIN I
TROPONIN I: 0.03 ng/mL — AB (ref <0.03)
Troponin I: <0.03 ng/mL (ref <0.03)

## 2016-04-11 LAB — URINE MICROSCOPIC-ADD ON

## 2016-04-11 LAB — CBC
HCT: 39.8 % (ref 36.0–46.0)
Hemoglobin: 12.8 g/dL (ref 12.0–15.0)
MCH: 29.3 pg (ref 26.0–34.0)
MCHC: 32.2 g/dL (ref 30.0–36.0)
MCV: 91.1 fL (ref 78.0–100.0)
PLATELETS: 188 10*3/uL (ref 150–400)
RBC: 4.37 MIL/uL (ref 3.87–5.11)
RDW: 13.9 % (ref 11.5–15.5)
WBC: 5.4 10*3/uL (ref 4.0–10.5)

## 2016-04-11 LAB — BASIC METABOLIC PANEL
ANION GAP: 5 (ref 5–15)
BUN: 8 mg/dL (ref 6–20)
CO2: 29 mmol/L (ref 22–32)
Calcium: 9.2 mg/dL (ref 8.9–10.3)
Chloride: 105 mmol/L (ref 101–111)
Creatinine, Ser: 1.13 mg/dL — ABNORMAL HIGH (ref 0.44–1.00)
GFR, EST AFRICAN AMERICAN: 56 mL/min — AB (ref >60)
GFR, EST NON AFRICAN AMERICAN: 48 mL/min — AB (ref >60)
Glucose, Bld: 87 mg/dL (ref 65–99)
Potassium: 4 mmol/L (ref 3.5–5.1)
SODIUM: 139 mmol/L (ref 135–145)

## 2016-04-11 LAB — URINALYSIS, ROUTINE W REFLEX MICROSCOPIC
Glucose, UA: NEGATIVE mg/dL
KETONES UR: NEGATIVE mg/dL
Nitrite: POSITIVE — AB
PROTEIN: NEGATIVE mg/dL
Specific Gravity, Urine: 1.024 (ref 1.005–1.030)
pH: 5.5 (ref 5.0–8.0)

## 2016-04-11 MED ORDER — OMEGA-3-ACID ETHYL ESTERS 1 G PO CAPS
2.0000 g | ORAL_CAPSULE | Freq: Every day | ORAL | Status: DC
  Administered 2016-04-11 – 2016-04-12 (×2): 2 g via ORAL
  Filled 2016-04-11 (×2): qty 2

## 2016-04-11 MED ORDER — METOPROLOL TARTRATE 25 MG PO TABS
25.0000 mg | ORAL_TABLET | Freq: Two times a day (BID) | ORAL | Status: DC
  Administered 2016-04-11 – 2016-04-12 (×3): 25 mg via ORAL
  Filled 2016-04-11 (×3): qty 1

## 2016-04-11 MED ORDER — POTASSIUM CHLORIDE CRYS ER 20 MEQ PO TBCR
20.0000 meq | EXTENDED_RELEASE_TABLET | Freq: Two times a day (BID) | ORAL | Status: DC
  Administered 2016-04-11 – 2016-04-12 (×3): 20 meq via ORAL
  Filled 2016-04-11 (×3): qty 1

## 2016-04-11 MED ORDER — PRAVASTATIN SODIUM 40 MG PO TABS
40.0000 mg | ORAL_TABLET | Freq: Every day | ORAL | Status: DC
  Administered 2016-04-11 – 2016-04-12 (×2): 40 mg via ORAL
  Filled 2016-04-11 (×2): qty 1

## 2016-04-11 MED ORDER — AMITRIPTYLINE HCL 100 MG PO TABS
100.0000 mg | ORAL_TABLET | Freq: Every day | ORAL | Status: DC
  Administered 2016-04-11: 100 mg via ORAL
  Filled 2016-04-11 (×2): qty 1

## 2016-04-11 MED ORDER — FERROUS SULFATE 325 (65 FE) MG PO TABS: 325.0000 mg | ORAL_TABLET | Freq: Every day | ORAL | Status: DC

## 2016-04-11 MED ORDER — ALBUTEROL SULFATE (2.5 MG/3ML) 0.083% IN NEBU
2.5000 mg | INHALATION_SOLUTION | Freq: Four times a day (QID) | RESPIRATORY_TRACT | Status: DC | PRN
Start: 2016-04-11 — End: 2016-04-12

## 2016-04-11 MED ORDER — VITAMIN D 1000 UNITS PO TABS
2000.0000 [IU] | ORAL_TABLET | Freq: Every day | ORAL | Status: DC
  Administered 2016-04-11: 2000 [IU] via ORAL
  Filled 2016-04-11: qty 2

## 2016-04-11 MED ORDER — APIXABAN 5 MG PO TABS
5.0000 mg | ORAL_TABLET | Freq: Two times a day (BID) | ORAL | Status: DC
  Administered 2016-04-11 – 2016-04-12 (×3): 5 mg via ORAL
  Filled 2016-04-11 (×3): qty 1

## 2016-04-11 MED ORDER — LEVOTHYROXINE SODIUM 100 MCG PO TABS
200.0000 ug | ORAL_TABLET | Freq: Every day | ORAL | Status: DC
  Administered 2016-04-12: 200 ug via ORAL
  Filled 2016-04-11: qty 2

## 2016-04-11 MED ORDER — VITAMIN C 500 MG PO TABS
500.0000 mg | ORAL_TABLET | Freq: Every day | ORAL | Status: DC
  Administered 2016-04-11 – 2016-04-12 (×2): 500 mg via ORAL
  Filled 2016-04-11 (×2): qty 1

## 2016-04-11 MED ORDER — ALLOPURINOL 300 MG PO TABS
300.0000 mg | ORAL_TABLET | Freq: Every day | ORAL | Status: DC
  Administered 2016-04-11 – 2016-04-12 (×2): 300 mg via ORAL
  Filled 2016-04-11 (×2): qty 1

## 2016-04-11 MED ORDER — DULOXETINE HCL 60 MG PO CPEP
60.0000 mg | ORAL_CAPSULE | Freq: Every day | ORAL | Status: DC
  Administered 2016-04-11: 60 mg via ORAL
  Filled 2016-04-11: qty 1

## 2016-04-11 MED ORDER — TRAMADOL HCL 50 MG PO TABS
50.0000 mg | ORAL_TABLET | Freq: Four times a day (QID) | ORAL | Status: DC | PRN
  Administered 2016-04-11 – 2016-04-12 (×3): 50 mg via ORAL
  Filled 2016-04-11 (×3): qty 1

## 2016-04-11 MED ORDER — PANTOPRAZOLE SODIUM 40 MG PO TBEC
40.0000 mg | DELAYED_RELEASE_TABLET | Freq: Every day | ORAL | Status: DC
  Administered 2016-04-11 – 2016-04-12 (×2): 40 mg via ORAL
  Filled 2016-04-11 (×2): qty 1

## 2016-04-11 NOTE — Consult Note (Signed)
Primary Physician: Primary Cardiologist:  Stanford Breed (seen 05/2015); Lovena Le  ASked to see re CP   HPI: 70 yo with history of CAD  (LHC in 2007 ([facility]) LAD 25%  D1 25%  D2 50%; RCA 75% prox); NSTEMI in 2011 in setting of infection);  TEE 2014 LVEF normal  PFO present), HTN Afib On eliquis, bradycardai (s/p PPM) breast CA, GERD COPD  PResentedon 7/22 with CP  Patient was at bank yesterday at noon  Under increased stress  Teller asked if she was ok , walked with her to car.  She developed discomfort under L breast that radiated to back  Not pleuritic or positional  Mild initially   Went to [employer] potato and cheeze  Symptoms got worse  Came to ER  Took several hours to go away    Currently CP free  Feels good  No SOB      SInce admit trop 0.03        Past Medical History:  Diagnosis Date  . Anemia   . Asthma 06/13/2011   hx of  . Atrial fibrillation ([place])   . Atrial flutter ([place])   . Breast cancer ([place]) dx'd 2005   No BP check or stick in Left arm  . CAD (coronary artery disease)    a. Nonobstructive CAD 2007 (EF 75%.  RCA  proximal 75% tubular, 25% prox LAD, 25% d1, 50% D2) at [facility]. b. NSTEMI 01/2010 in setting of respiratory failure, medical approach (not a good candidate for noninvasive eval)  . Cellulitis    hx of cellulitis in left leg  . Cervical radiculopathy 06/13/2011  . COPD (chronic obstructive pulmonary disease) ([place])    on O2  . Depression 06/13/2011  . Diverticulosis   . Esophageal dysmotility   . Gout 06/13/2011  . History of Clostridium difficile early dec 2015   no current diarrhea  . History of home oxygen therapy    2 liters per nasal cannula all the time  . Hx of dysfunctional uterine bleeding    last bleeding jan 2016, worked up for with d and c x 2 no cause found  . Hyperlipidemia   . Hypertension   . Hypothyroidism   . Impaired glucose tolerance 11/01/2012  . Neuropathy ([place]) 06/13/2011  . Obesity hypoventilation syndrome ([place]) 06/13/2011    . OSA on CPAP   . PFO (patent foramen ovale)   . Risk for falls   . Syncope and collapse    Bradycardia with pauses. S/P pacemaker  . Tubular adenoma of colon     Medications Prior to Admission  Medication Sig Dispense Refill  . albuterol (PROVENTIL HFA;VENTOLIN HFA) 108 (90 Base) MCG/ACT inhaler Inhale 2 puffs into the lungs every 6 (six) hours as needed for wheezing or shortness of breath. 18 g 1  . allopurinol (ZYLOPRIM) 300 MG tablet TAKE 1 TABLET DAILY 90 tablet 1  . amitriptyline (ELAVIL) 100 MG tablet take 1 tablet by mouth at bedtime if needed for sleep 90 tablet 1  . Cholecalciferol (VITAMIN D) 2000 UNITS tablet Take 2,000 Units by mouth at bedtime.    . diclofenac sodium (VOLTAREN) 1 % GEL Apply 4 g topically 4 (four) times daily as needed. 400 g 5  . DULoxetine (CYMBALTA) 30 MG capsule Take 1 capsule (30 mg total) by mouth 2 (two) times daily. Enough until receive mail order 30 capsule 0  . ELIQUIS 5 MG TABS tablet TAKE 1 TABLET TWICE A DAY 180  tablet 3  . ferrous sulfate 325 (65 FE) MG tablet Take 325 mg by mouth daily with breakfast.    . furosemide (LASIX) 40 MG tablet Take 1 tablet every morning & 1 tablet every other night 180 tablet 0  . levothyroxine (SYNTHROID, LEVOTHROID) 200 MCG tablet TAKE 1 TABLET DAILY 90 tablet 3  . metoprolol tartrate (LOPRESSOR) 25 MG tablet Take 1 tablet (25 mg total) by mouth 2 (two) times daily. Reported on 02/12/2016 180 tablet 1  . Omega-3 Fatty Acids (FISH OIL PO) Take 1 capsule by mouth daily.     [name] omeprazole (PRILOSEC) 40 MG capsule Take 1 capsule (40 mg total) by mouth daily. 90 capsule 3  . potassium chloride SA (K-DUR,KLOR-CON) 20 MEQ tablet Take 1 tablet (20 mEq total) by mouth 2 (two) times daily. 60 tablet 0  . pravastatin (PRAVACHOL) 40 MG tablet Take 1 tablet (40 mg total) by mouth daily. 90 tablet 0  . traMADol (ULTRAM) 50 MG tablet Take 1 tablet (50 mg total) by mouth every 8 (eight) hours as needed. 90 tablet 2  . vitamin C  (ASCORBIC ACID) 500 MG tablet Take 500 mg by mouth daily.        [name] allopurinol  300 mg Oral Daily  . amitriptyline  100 mg Oral QHS  . apixaban  5 mg Oral BID  . cholecalciferol  2,000 Units Oral QHS  . DULoxetine  60 mg Oral QHS  . [START ON 04/12/2016] ferrous sulfate  325 mg Oral Q breakfast  . [START ON 04/12/2016] levothyroxine  200 mcg Oral QAC breakfast  . metoprolol tartrate  25 mg Oral BID  . omega-3 acid ethyl esters  2 g Oral Daily  . pantoprazole  40 mg Oral Daily  . potassium chloride SA  20 mEq Oral BID  . pravastatin  40 mg Oral Daily  . vitamin C  500 mg Oral Daily    Infusions:    Allergies  Allergen Reactions  . Cephalexin Other (See Comments)    Exfoliative dermatitis reaction  . Codeine     REACTION: unknown - pt. states unaware of having reaction to codeine  . Dilaudid [Hydromorphone] Other (See Comments)    Other Reaction: Oversedation  . Lisinopril     REACTION: cough  . Penicillins     rash  . Cleocin [Clindamycin Hcl] Rash    rash    Social History   Social History  . Marital status: Divorced    Spouse name: N/A  . Number of children: 2  . Years of education: N/A   Occupational History  . Retired    Social History Main Topics  . Smoking status: Former Smoker    Packs/day: 1.00    Years: 20.00    Types: Cigarettes    Quit date: 09/20/1998  . Smokeless tobacco: Never Used     Comment: 1ppd x 20 years  . Alcohol use 0.6 oz/week    1 Glasses of wine per week     Comment: rare  . Drug use: No  . Sexual activity: Not Currently    Birth control/ protection: Post-menopausal   Other Topics Concern  . Not on file   Social History Narrative   Lives alone in an apartment.    Family History  Problem Relation Age of Onset  . Uterine cancer Mother   . Heart disease Mother   . Breast cancer Sister 6  . Ovarian cancer Sister   . Diabetes Brother   .  Breast cancer Maternal Aunt   . Breast cancer Cousin 93  . Ovarian cancer Sister      REVIEW OF SYSTEMS:  All systems reviewed  Negative to the above problem except as noted above.    PHYSICAL EXAM: Vitals:   04/11/16 1139 04/11/16 1142  BP: (!) 151/91 (!) 96/40  Pulse: 78 83  Resp: 18 18  Temp: 98.1 F (36.7 C) 97.8 F (36.6 C)     Intake/Output Summary (Last 24 hours) at 04/11/16 1232 Last data filed at 04/11/16 0953  Gross per 24 hour  Intake              460 ml  Output                0 ml  Net              460 ml    General:  Morbidly obese 70 yo in NAD   HEENT: normal Neck: supple. no JVD. Carotids 2+ bilat; no bruits. No lymphadenopathy or thryomegaly appreciated. Cor: PMI nondisplaced. Regular rate & rhythm. No rubs, gallops or murmurs. Lungs: clear Abdomen: soft, nontender, nondistended. No hepatosplenomegaly. No bruits or masses. Good bowel sounds. Extremities: no cyanosis, clubbing, rash, edema Neuro: alert & oriented x 3, cranial nerves grossly intact. moves all 4 extremities w/o difficulty. Affect pleasant.  ECG:  On 7/22  SR 94 bpm    Results for orders placed or performed during the hospital encounter of 04/10/16 (from the past 24 hour(s))  Basic metabolic panel     Status: Abnormal   Collection Time: 04/10/16  5:50 PM  Result Value Ref Range   Sodium 142 135 - 145 mmol/L   Potassium 4.2 3.5 - 5.1 mmol/L   Chloride 106 101 - 111 mmol/L   CO2 28 22 - 32 mmol/L   Glucose, Bld 105 (H) 65 - 99 mg/dL   BUN 7 6 - 20 mg/dL   Creatinine, Ser 1.03 (H) 0.44 - 1.00 mg/dL   Calcium 9.3 8.9 - 10.3 mg/dL   GFR calc non Af Amer 54 (L) >60 mL/min   GFR calc Af Amer >60 >60 mL/min   Anion gap 8 5 - 15  CBC     Status: None   Collection Time: 04/10/16  5:50 PM  Result Value Ref Range   WBC 6.2 4.0 - 10.5 K/uL   RBC 4.63 3.87 - 5.11 MIL/uL   Hemoglobin 13.2 12.0 - 15.0 g/dL   HCT 42.7 36.0 - 46.0 %   MCV 92.2 78.0 - 100.0 fL   MCH 28.5 26.0 - 34.0 pg   MCHC 30.9 30.0 - 36.0 g/dL   RDW 13.8 11.5 - 15.5 %   Platelets 200 150 - 400 K/uL  Brain  natriuretic peptide     Status: None   Collection Time: 04/10/16  5:50 PM  Result Value Ref Range   B Natriuretic Peptide 91.7 0.0 - 100.0 pg/mL  I-stat troponin, ED     Status: None   Collection Time: 04/10/16  6:04 PM  Result Value Ref Range   Troponin i, poc 0.01 0.00 - 0.08 ng/mL   Comment 3          I-stat troponin, ED     Status: None   Collection Time: 04/10/16  9:45 PM  Result Value Ref Range   Troponin i, poc 0.01 0.00 - 0.08 ng/mL   Comment 3          Troponin I (q 6hr  x 3)     Status: Abnormal   Collection Time: 04/11/16  8:04 AM  Result Value Ref Range   Troponin I 0.03 (HH) <0.03 ng/mL   Dg Chest 2 View  Result Date: 04/10/2016 CLINICAL DATA:  Left-sided chest pain extending through the back. EXAM: CHEST  2 VIEW COMPARISON:  Chest radiographs 04/29/2015 and CT 04/30/2015 FINDINGS: Dual lead pacemaker remains in place. The cardiac silhouette is mildly enlarged. Mild tortuosity of the thoracic aorta is unchanged. The lungs remain hyperinflated. Mild pulmonary vascular congestion is similar to the prior study. There is no evidence of lobar consolidation, overt alveolar edema, pleural effusion, or pneumothorax. Degenerative changes are noted about the shoulders. IMPRESSION: Cardiomegaly and mild pulmonary vascular congestion. Electronically Signed   By: Logan Bores M.D.   On: 04/10/2016 18:24     ASSESSMENT:  1  CP  Atypical  Prolonged pain  Enzymes without elevation   I am not convinced cardiac   ? GI   Pt is comfortable now Echo pending  I have encouraged her to walk in hallway and follow symptoms   I would not plan any other testing for now    2  CAD  As above    3  PPM  4  Paroxysmal atrial flutter  Continue Eliquis    5  HL  6  HTN  BP is labile  Keep on same meds

## 2016-04-11 NOTE — Progress Notes (Signed)
Setup CPAP at patient's bedside. She states that she will place it on when ready. RT will continue to monitor as needed.

## 2016-04-11 NOTE — Progress Notes (Signed)
PROGRESS NOTE                                                                                                                                                                                                             Patient Demographics:    [name] [name], is a 70 y.o. female, DOB - 10/10/1945, UOH:[id number]  Admit date - 04/10/2016   Admitting Physician Toy Baker, MD  Outpatient Primary MD for the patient is [name], MD  LOS - 0  Chief Complaint  Patient presents with  . Chest Pain       Brief Narrative  69 y.o. female with medical history significant of CAD, HTN, OSA, A.fib on eliquis, breast cancer in remission, GERD, remote c.diffinfection, COPD presents with chest pain.   Subjective:    [name] [name] today has, No headache, No further chest pain, No abdominal pain   Assessment  & Plan :    Active Problems:   Hyperlipidemia   Essential hypertension   Sleep apnea   Anticoagulant long-term use   Atrial fibrillation (HCC)   Chest pain  Chest pain - Has musculoskeletal feature, but will continue to follow cardiac enzymes, obtain serial EKGs, follow lipid panel, hemoglobin A1c, check 2-D echo, received aspirin in ED.  Atrial fibrillation - cont with Eliquis and metoprolol.  HTN  - continue with home meds  Hyperlipedemia - continue with statins  OSA - continue with CPAP  Code Status : Full code  Family Communication  : D/W patient , none at bedside  Disposition Plan  : home in am if negative work up.  Consults  :  none  Procedures  : none  DVT Prophylaxis  :  Eliquis  Lab Results  Component Value Date   PLT 200 04/10/2016    Antibiotics  :    Anti-infectives    None        Objective:   Vitals:   04/10/16 2230 04/10/16 2300 04/10/16 2330 04/11/16 0300  BP: 131/69 142/67 130/60 131/78  Pulse: 88 85 88 (!) 101  Resp:   18 18  Temp:    98.4 F (36.9 C)  TempSrc:    Oral  SpO2: 97% 97% 99% 98%    Weight:    (!) 145 kg (319 lb 11.2 oz)  Height:    5' 4"  (1.626 m)  Wt Readings from Last 3 Encounters:  04/11/16 (!) 145 kg (319 lb 11.2 oz)  02/12/16 (!) 145.6 kg (321 lb)  08/21/15 (!) 139.7 kg (308 lb)     Intake/Output Summary (Last 24 hours) at 04/11/16 1140 Last data filed at 04/11/16 0953  Gross per 24 hour  Intake              460 ml  Output                0 ml  Net              460 ml     Physical Exam  Awake Alert, Oriented X 3, No new F.N deficits, Normal affect [place].AT,PERRAL Supple Neck,No JVD,  Symmetrical Chest wall movement, Good air movement bilaterally, CTAB RRR,No Gallops,Rubs or new Murmurs, No Parasternal Heave +ve B.Sounds, Abd Soft, No tenderness,  No rebound - guarding or rigidity. No Cyanosis, Clubbing or edema, No new Rash or bruise      Data Review:    CBC  Recent Labs Lab 04/10/16 1750  WBC 6.2  HGB 13.2  HCT 42.7  PLT 200  MCV 92.2  MCH 28.5  MCHC 30.9  RDW 13.8    Chemistries   Recent Labs Lab 04/10/16 1750  NA 142  K 4.2  CL 106  CO2 28  GLUCOSE 105*  BUN 7  CREATININE 1.03*  CALCIUM 9.3   ------------------------------------------------------------------------------------------------------------------ No results for input(s): CHOL, HDL, LDLCALC, TRIG, CHOLHDL, LDLDIRECT in the last 72 hours.  Lab Results  Component Value Date   HGBA1C 5.5 05/06/2015   ------------------------------------------------------------------------------------------------------------------ No results for input(s): TSH, T4TOTAL, T3FREE, THYROIDAB in the last 72 hours.  Invalid input(s): FREET3 ------------------------------------------------------------------------------------------------------------------ No results for input(s): VITAMINB12, FOLATE, FERRITIN, TIBC, IRON, RETICCTPCT in the last 72 hours.  Coagulation profile No results for input(s): INR, PROTIME in the last 168 hours.  No results for input(s): DDIMER in the last  72 hours.  Cardiac Enzymes  Recent Labs Lab 04/11/16 0804  TROPONINI 0.03*   ------------------------------------------------------------------------------------------------------------------    Component Value Date/Time   BNP 91.7 04/10/2016 1750    Inpatient Medications  Scheduled Meds: . allopurinol  300 mg Oral Daily  . amitriptyline  100 mg Oral QHS  . apixaban  5 mg Oral BID  . cholecalciferol  2,000 Units Oral QHS  . DULoxetine  60 mg Oral QHS  . [START ON 04/12/2016] ferrous sulfate  325 mg Oral Q breakfast  . [START ON 04/12/2016] levothyroxine  200 mcg Oral QAC breakfast  . metoprolol tartrate  25 mg Oral BID  . omega-3 acid ethyl esters  2 g Oral Daily  . pantoprazole  40 mg Oral Daily  . potassium chloride SA  20 mEq Oral BID  . pravastatin  40 mg Oral Daily  . vitamin C  500 mg Oral Daily   Continuous Infusions:  PRN Meds:.albuterol, traMADol  Micro Results No results found for this or any previous visit (from the past 240 hour(s)).  Radiology Reports Dg Chest 2 View  Result Date: 04/10/2016 CLINICAL DATA:  Left-sided chest pain extending through the back. EXAM: CHEST  2 VIEW COMPARISON:  Chest radiographs 04/29/2015 and CT 04/30/2015 FINDINGS: Dual lead pacemaker remains in place. The cardiac silhouette is mildly enlarged. Mild tortuosity of the thoracic aorta is unchanged. The lungs remain hyperinflated. Mild pulmonary vascular congestion is similar to the prior study. There is no evidence of lobar consolidation, overt alveolar edema, pleural effusion, or pneumothorax. Degenerative changes  are noted about the shoulders. IMPRESSION: Cardiomegaly and mild pulmonary vascular congestion. Electronically Signed   By: Logan Bores M.D.   On: 04/10/2016 18:24     ELGERGAWY, DAWOOD M.D on 04/11/2016 at 11:40 AM  Between 7am to 7pm - Pager - [phone]  After 7pm go to [email or web address] - password [facility]  [facility] -  Office  [phone]

## 2016-04-11 NOTE — H&P (Signed)
[name] [name] AYT:[id number] DOB: July 22, 1946 DOA: 04/10/2016     PCP: [name], MD   Outpatient Specialists: cardiology [place] , GI [facility] Patient coming from: home Lives alone   Chief Complaint: chest pain  HPI: [name] [name] is a 70 y.o. female with medical history significant of CAD, HTN, OSA, A.fib on eliquis, breast cancer in remission, GERD, remote c.diffinfection, COPD    Presented with chest pain that started while at the [facility]. Describes as dull pain under left breast radiating to the back. No prior similar pains, no associated with SOB, DOe, nausea or fever. Lasted 2 hurs until she presented to ER and have resolved now.. Patient endorses in creased stress lately. She feels that her chest pain is secondary to that.   Regarding pertinent Chronic problems: according to cardiology notes LHC at [facility] 10/2005: pRCA 75%, LM < 25%, pLAD 25%, D1 25%, D2 50%.  NSTEMI in 01/2010 in the setting of SIRS from leg cellulitis. This was felt to be demand ischemia. Med Rx was pursued felt to be a poor candidate for noninvasive imaging. She developed atrial flutter with RVR in June 2014. She was felt to be a poor candidate for atrial flutter ablation. TEE guided cardioversion was performed. She had restoration of NSR. TEE 03/15/13: EF 55-60%, no LA clot, positive PFO,  She developed syncope in 2014. She had an implantable loop placed which demonstrated symptomatic bradycardia with pauses and ultimately had a pacemaker placed  IN ER: afebrile, HR up to 101 Chest pain free ER provider discussed case with Cardiology who recommended admission to medicine   CXR mild pulmonary congestion  Hospitalist was called for admission for chest pain   Review of Systems:    Pertinent positives include: chest pain  Constitutional:  No weight loss, night sweats, Fevers, chills, fatigue, weight loss  HEENT:  No headaches, Difficulty swallowing,Tooth/dental problems,Sore throat,  No sneezing, itching,  ear ache, nasal congestion, post nasal drip,  Cardio-vascular:  No , Orthopnea, PND, anasarca, dizziness, palpitations.no Bilateral lower extremity swelling  GI:  No heartburn, indigestion, abdominal pain, nausea, vomiting, diarrhea, change in bowel habits, loss of appetite, melena, blood in stool, hematemesis Resp:  no shortness of breath at rest. No dyspnea on exertion, No excess mucus, no productive cough, No non-productive cough, No coughing up of blood.No change in color of mucus.No wheezing. Skin:  no rash or lesions. No jaundice GU:  no dysuria, change in color of urine, no urgency or frequency. No straining to urinate.  No flank pain.  Musculoskeletal:  No joint pain or no joint swelling. No decreased range of motion. No back pain.  Psych:  No change in mood or affect. No depression or anxiety. No memory loss.  Neuro: no localizing neurological complaints, no tingling, no weakness, no double vision, no gait abnormality, no slurred speech, no confusion  As per HPI otherwise 10 point review of systems negative.   Past Medical History: Past Medical History:  Diagnosis Date  . Anemia   . Asthma 06/13/2011   hx of  . Atrial fibrillation ([place])   . Atrial flutter ([place])   . Breast cancer ([place]) dx'd 2005   No BP check or stick in Left arm  . CAD (coronary artery disease)    a. Nonobstructive CAD 2007 (EF 75%.  RCA  proximal 75% tubular, 25% prox LAD, 25% d1, 50% D2) at [facility]. b. NSTEMI 01/2010 in setting of respiratory failure, medical approach (not a good candidate for noninvasive eval)  .  Cellulitis    hx of cellulitis in left leg  . Cervical radiculopathy 06/13/2011  . COPD (chronic obstructive pulmonary disease) ([place])    on O2  . Depression 06/13/2011  . Diverticulosis   . Esophageal dysmotility   . Gout 06/13/2011  . History of Clostridium difficile early dec 2015   no current diarrhea  . History of home oxygen therapy    2 liters per nasal cannula all the time  . Hx of  dysfunctional uterine bleeding    last bleeding jan 2016, worked up for with d and c x 2 no cause found  . Hyperlipidemia   . Hypertension   . Hypothyroidism   . Impaired glucose tolerance 11/01/2012  . Neuropathy ([place]) 06/13/2011  . Obesity hypoventilation syndrome ([place]) 06/13/2011  . OSA on CPAP   . PFO (patent foramen ovale)   . Risk for falls   . Syncope and collapse    Bradycardia with pauses. S/P pacemaker  . Tubular adenoma of colon    Past Surgical History:  Procedure Laterality Date  . BREAST LUMPECTOMY Left   . CARDIOVERSION N/A 03/15/2013   Procedure: CARDIOVERSION;  Surgeon: Thayer Headings, MD;  Location: [facility] ENDOSCOPY;  Service: Cardiovascular;  Laterality: N/A;  . CERVICAL BIOPSY  June 2013   at [facility] for Heavy  Bleeding  . [place] [facility]  2004 or 2005   have cadaver bones,, screws, and plates c 5 to c 6  . COLONOSCOPY    . COLONOSCOPY WITH PROPOFOL N/A 10/22/2014   Procedure: COLONOSCOPY WITH PROPOFOL;  Surgeon: Jerene Bears, MD;  Location: [facility] ENDOSCOPY;  Service: Gastroenterology;  Laterality: N/A;  . DILATION AND CURETTAGE OF UTERUS    . LAPAROSCOPIC APPENDECTOMY  03/29/2012   Procedure: APPENDECTOMY LAPAROSCOPIC;  Surgeon: Adin Hector, MD;  Location: [facility] ORS;  Service: General;  Laterality: N/A;  . LOOP RECORDER EXPLANT  08/31/2013   Procedure: LOOP RECORDER EXPLANT;  Surgeon: Coralyn Mark, MD;  Location: [facility] CATH LAB;  Service: Cardiovascular;;  . LOOP RECORDER IMPLANT  08-28-2013; 08-31-2013   MDT LinQ implanted by Dr Rayann Heman for syncope; explanted 08-31-2013 after sinus pauses identified  . LOOP RECORDER IMPLANT N/A 08/28/2013   Procedure: LOOP RECORDER IMPLANT;  Surgeon: Coralyn Mark, MD;  Location: [place] CATH LAB;  Service: Cardiovascular;  Laterality: N/A;  . OVARIAN CYST REMOVAL    . PACEMAKER INSERTION  08-31-2013   Biotronik Evera dual chamber pacemaker placed for neurocardiogenic syncope and sinus pauses by Dr Rayann Heman  . PERMANENT PACEMAKER INSERTION  N/A 08/31/2013   Procedure: PERMANENT PACEMAKER INSERTION;  Surgeon: Coralyn Mark, MD;  Location: [place] CATH LAB;  Service: Cardiovascular;  Laterality: N/A;  . TEE WITHOUT CARDIOVERSION N/A 03/15/2013   Procedure: TRANSESOPHAGEAL ECHOCARDIOGRAM (TEE);  Surgeon: Thayer Headings, MD;  Location: [place];  Service: Cardiovascular;  Laterality: N/A;     Social History:  Ambulatory  independently    reports that she quit smoking about 17 years ago. Her smoking use included Cigarettes. She has a 20.00 pack-year smoking history. She has never used smokeless tobacco. She reports that she drinks about 0.6 oz of alcohol per week . She reports that she does not use drugs.  Allergies:   Allergies  Allergen Reactions  . Cephalexin Other (See Comments)    Exfoliative dermatitis reaction  . Codeine     REACTION: unknown - pt. states unaware of having reaction to codeine  . Dilaudid [Hydromorphone] Other (See Comments)  Other Reaction: Oversedation  . Lisinopril     REACTION: cough  . Penicillins     rash  . Cleocin [Clindamycin Hcl] Rash    rash       Family History:  Family History  Problem Relation Age of Onset  . Uterine cancer Mother   . Heart disease Mother   . Breast cancer Sister 69  . Ovarian cancer Sister   . Diabetes Brother   . Breast cancer Maternal Aunt   . Breast cancer Cousin 12  . Ovarian cancer Sister     Medications: Prior to Admission medications   Medication Sig Start Date End Date Taking? Authorizing Provider  albuterol (PROVENTIL HFA;VENTOLIN HFA) 108 (90 Base) MCG/ACT inhaler Inhale 2 puffs into the lungs every 6 (six) hours as needed for wheezing or shortness of breath. 12/11/15  Yes Biagio Borg, MD  allopurinol (ZYLOPRIM) 300 MG tablet TAKE 1 TABLET DAILY 02/20/16  Yes Biagio Borg, MD  amitriptyline (ELAVIL) 100 MG tablet take 1 tablet by mouth at bedtime if needed for sleep 02/19/16  Yes Biagio Borg, MD  Cholecalciferol (VITAMIN D) 2000 UNITS tablet  Take 2,000 Units by mouth at bedtime.   Yes Historical Provider, MD  diclofenac sodium (VOLTAREN) 1 % GEL Apply 4 g topically 4 (four) times daily as needed. 07/16/15  Yes Biagio Borg, MD  DULoxetine (CYMBALTA) 30 MG capsule Take 1 capsule (30 mg total) by mouth 2 (two) times daily. Enough until receive mail order 12/29/15  Yes Biagio Borg, MD  ELIQUIS 5 MG TABS tablet TAKE 1 TABLET TWICE A DAY 08/25/15  Yes Biagio Borg, MD  ferrous sulfate 325 (65 FE) MG tablet Take 325 mg by mouth daily with breakfast.   Yes Historical Provider, MD  furosemide (LASIX) 40 MG tablet Take 1 tablet every morning & 1 tablet every other night 02/27/16  Yes Biagio Borg, MD  levothyroxine (SYNTHROID, LEVOTHROID) 200 MCG tablet TAKE 1 TABLET DAILY 08/25/15  Yes Biagio Borg, MD  metoprolol tartrate (LOPRESSOR) 25 MG tablet Take 1 tablet (25 mg total) by mouth 2 (two) times daily. Reported on 02/12/2016 03/10/16  Yes Biagio Borg, MD  Omega-3 Fatty Acids (FISH OIL PO) Take 1 capsule by mouth daily.    Yes Historical Provider, MD  omeprazole (PRILOSEC) 40 MG capsule Take 1 capsule (40 mg total) by mouth daily. 07/16/15  Yes Jerene Bears, MD  potassium chloride SA (K-DUR,KLOR-CON) 20 MEQ tablet Take 1 tablet (20 mEq total) by mouth 2 (two) times daily. 12/29/15  Yes Biagio Borg, MD  pravastatin (PRAVACHOL) 40 MG tablet Take 1 tablet (40 mg total) by mouth daily. 04/05/16  Yes Lelon Perla, MD  traMADol (ULTRAM) 50 MG tablet Take 1 tablet (50 mg total) by mouth every 8 (eight) hours as needed. 03/10/16  Yes Biagio Borg, MD  vitamin C (ASCORBIC ACID) 500 MG tablet Take 500 mg by mouth daily.    Yes Historical Provider, MD    Physical Exam: Patient Vitals for the past 24 hrs:  BP Temp Temp src Pulse Resp SpO2 Height Weight  04/11/16 0300 131/78 98.4 F (36.9 C) Oral (!) 101 18 98 % 5' 4"  (1.626 m) (!) 145 kg (319 lb 11.2 oz)  04/10/16 2330 130/60 - - 88 18 99 % - -  04/10/16 2300 142/67 - - 85 - 97 % - -  04/10/16 2230  131/69 - - 88 - 97 % - -  04/10/16 2202 125/66 - - 89 23 97 % - -  04/10/16 2140 141/71 - - 92 (!) 28 97 % - -  04/10/16 2108 - - - - - 97 % - -  04/10/16 2100 137/72 - - 93 23 94 % - -  04/10/16 2058 (!) 145/52 98.9 F (37.2 C) Oral 94 21 98 % - -  04/10/16 1735 104/86 98.7 F (37.1 C) Oral 94 18 95 % 5' 4"  (1.626 m) (!) 145.6 kg (321 lb)    1. General:  in No Acute distress 2. Psychological: Alert and  Oriented 3. Head/ENT:   Moist   Mucous Membranes                          Head Non traumatic, neck supple                          Normal   Dentition 4. SKIN: normal  Skin turgor,  Skin clean Dry and intact no rash 5. Heart: Regular rate and rhythm no  Murmur, Rub or gallop 6. Lungs:  no wheezes or crackles  distant 7. Abdomen: Soft, non-tender, Non distended, obese 8. Lower extremities: no clubbing, cyanosis, or edema 9. Neurologically Grossly intact, moving all 4 extremities equally 10. MSK: Normal range of motion   body mass index is 54.88 kg/m.  Labs on Admission:   Labs on Admission: I have personally reviewed following labs and imaging studies  CBC:  Recent Labs Lab 04/10/16 1750  WBC 6.2  HGB 13.2  HCT 42.7  MCV 92.2  PLT 563   Basic Metabolic Panel:  Recent Labs Lab 04/10/16 1750  NA 142  K 4.2  CL 106  CO2 28  GLUCOSE 105*  BUN 7  CREATININE 1.03*  CALCIUM 9.3   GFR: Estimated Creatinine Clearance: 73.9 mL/min (by C-G formula based on SCr of 1.03 mg/dL). Liver Function Tests: No results for input(s): AST, ALT, ALKPHOS, BILITOT, PROT, ALBUMIN in the last 168 hours. No results for input(s): LIPASE, AMYLASE in the last 168 hours. No results for input(s): AMMONIA in the last 168 hours. Coagulation Profile: No results for input(s): INR, PROTIME in the last 168 hours. Cardiac Enzymes: No results for input(s): CKTOTAL, CKMB, CKMBINDEX, TROPONINI in the last 168 hours. BNP (last 3 results) No results for input(s): PROBNP in the last 8760  hours. HbA1C: No results for input(s): HGBA1C in the last 72 hours. CBG: No results for input(s): GLUCAP in the last 168 hours. Lipid Profile: No results for input(s): CHOL, HDL, LDLCALC, TRIG, CHOLHDL, LDLDIRECT in the last 72 hours. Thyroid Function Tests: No results for input(s): TSH, T4TOTAL, FREET4, T3FREE, THYROIDAB in the last 72 hours. Anemia Panel: No results for input(s): VITAMINB12, FOLATE, FERRITIN, TIBC, IRON, RETICCTPCT in the last 72 hours. Urine analysis:    Component Value Date/Time   COLORURINE YELLOW 11/13/2013 1531   APPEARANCEUR CLEAR 11/13/2013 1531   LABSPEC 1.025 11/13/2013 1531   PHURINE 6.0 11/13/2013 1531   GLUCOSEU NEGATIVE 11/13/2013 1531   HGBUR NEGATIVE 11/13/2013 1531   BILIRUBINUR NEGATIVE 11/13/2013 1531   KETONESUR NEGATIVE 11/13/2013 1531   PROTEINUR NEGATIVE 08/26/2013 1759   UROBILINOGEN 0.2 11/13/2013 1531   NITRITE NEGATIVE 11/13/2013 1531   LEUKOCYTESUR SMALL (A) 11/13/2013 1531   Sepsis Labs: @LABRCNTIP (procalcitonin:4,lacticidven:4) )No results found for this or any previous visit (from the past 240 hour(s)).    UA not obtained  Lab Results  Component Value Date   HGBA1C 5.5 05/06/2015    Estimated Creatinine Clearance: 73.9 mL/min (by C-G formula based on SCr of 1.03 mg/dL).  BNP (last 3 results) No results for input(s): PROBNP in the last 8760 hours.   ECG REPORT  Independently reviewed Rate:94  Rhythm: NSR  ST&T Change: No acute ischemic changes QTC 437  Filed Weights   04/10/16 1735 04/11/16 0300  Weight: (!) 145.6 kg (321 lb) (!) 145 kg (319 lb 11.2 oz)     Cultures:    Component Value Date/Time   SDES URINE, RANDOM 08/26/2013 1759   SPECREQUEST NONE 08/26/2013 1759   CULT  08/26/2013 1759    KLEBSIELLA PNEUMONIAE ESCHERICHIA COLI Performed at [place] 08/29/2013 FINAL 08/26/2013 1759     Radiological Exams on Admission: Dg Chest 2 View  Result Date: 04/10/2016 CLINICAL  DATA:  Left-sided chest pain extending through the back. EXAM: CHEST  2 VIEW COMPARISON:  Chest radiographs 04/29/2015 and CT 04/30/2015 FINDINGS: Dual lead pacemaker remains in place. The cardiac silhouette is mildly enlarged. Mild tortuosity of the thoracic aorta is unchanged. The lungs remain hyperinflated. Mild pulmonary vascular congestion is similar to the prior study. There is no evidence of lobar consolidation, overt alveolar edema, pleural effusion, or pneumothorax. Degenerative changes are noted about the shoulders. IMPRESSION: Cardiomegaly and mild pulmonary vascular congestion. Electronically Signed   By: Logan Bores M.D.   On: 04/10/2016 18:24    Chart has been reviewed    Assessment/Plan  70 y.o. female with medical history significant of CAD, HTN, OSA, A.fib on eliquis, breast cancer in remission, GERD, remote c.diffinfection, COPD here with atypical chest pain currently resolved     Present on Admission: . Chest pain - - given risk factors will admit, monitor on telemetry, cycle cardiac enzymes, obtain serial ECG. Further risk stratify with lipid panel, hgA1C, obtain TSH. Make sure patient is on Aspirin. Further treatment based on the currently pending results.  . Atrial fibrillation ([place]) - continue eliquis and metoprolol . Essential hypertension - stable continue home medicatins . Hyperlipidemia - stable continue statin . Sleep apnea - CPAP   Other plan as per orders.  DVT prophylaxis:  eliquis  Code Status:  FULL CODE   as per patient    Family Communication:   Family not  at  Bedside     Disposition Plan:   To home once workup is complete and patient is stable   Consults called: ER called Cardiology  Admission status:  obs    Level of care     Tele      I have spent a total of 56 min on this admission  [name] [name] 04/11/2016, 6:30 AM    [facility]  Pager [phone]   after 2 AM please page floor coverage PA If 7AM-7PM, please contact  the day team taking care of the patient  [email or web address]  Password [email or web address]

## 2016-04-12 ENCOUNTER — Observation Stay (HOSPITAL_BASED_OUTPATIENT_CLINIC_OR_DEPARTMENT_OTHER): Payer: Medicare Other

## 2016-04-12 DIAGNOSIS — I10 Essential (primary) hypertension: Secondary | ICD-10-CM | POA: Diagnosis not present

## 2016-04-12 DIAGNOSIS — R079 Chest pain, unspecified: Secondary | ICD-10-CM

## 2016-04-12 DIAGNOSIS — N3000 Acute cystitis without hematuria: Secondary | ICD-10-CM

## 2016-04-12 DIAGNOSIS — R072 Precordial pain: Secondary | ICD-10-CM | POA: Diagnosis not present

## 2016-04-12 DIAGNOSIS — R0789 Other chest pain: Secondary | ICD-10-CM | POA: Diagnosis not present

## 2016-04-12 DIAGNOSIS — Z7901 Long term (current) use of anticoagulants: Secondary | ICD-10-CM | POA: Diagnosis not present

## 2016-04-12 DIAGNOSIS — I480 Paroxysmal atrial fibrillation: Secondary | ICD-10-CM | POA: Diagnosis not present

## 2016-04-12 LAB — ECHOCARDIOGRAM COMPLETE
Area-P 1/2: 2.27 cm2
CHL CUP TV REG PEAK VELOCITY: 342 cm/s
E/e' ratio: 30.56
EWDT: 356 ms
FS: 22 % — AB (ref 28–44)
HEIGHTINCHES: 64 in
IV/PV OW: 0.93
LA diam end sys: 43 mm
LA vol A4C: 117 ml
LADIAMINDEX: 1.79 cm/m2
LASIZE: 43 mm
LV E/e' medial: 30.56
LV SIMPSON'S DISK: 65
LV TDI E'MEDIAL: 6.2
LV sys vol index: 14 mL/m2
LVDIAVOL: 98 mL (ref 46–106)
LVDIAVOLIN: 41 mL/m2
LVEEAVG: 30.56
LVELAT: 4.68 cm/s
LVOT VTI: 29.8 cm
LVOT area: 3.14 cm2
LVOT peak grad rest: 7 mmHg
LVOTD: 20 mm
LVOTPV: 136 cm/s
LVOTSV: 94 mL
LVSYSVOL: 34 mL (ref 14–42)
MV Dec: 356
MV Peak grad: 8 mmHg
MV VTI: 161 cm
MV pk E vel: 143 m/s
MVSPHT: 97 ms
P 1/2 time: 383 ms
PISA EROA: 0.39 cm2
PW: 14 mm — AB (ref 0.6–1.1)
RV TAPSE: 25.7 mm
Stroke v: 64 ml
TDI e' lateral: 4.68
TR max vel: 342 cm/s
Weight: 5169.6 oz

## 2016-04-12 MED ORDER — CIPROFLOXACIN IN D5W 400 MG/200ML IV SOLN
400.0000 mg | Freq: Two times a day (BID) | INTRAVENOUS | Status: DC
  Administered 2016-04-12: 400 mg via INTRAVENOUS
  Filled 2016-04-12: qty 200

## 2016-04-12 MED ORDER — FERROUS SULFATE 325 (65 FE) MG PO TABS
325.0000 mg | ORAL_TABLET | Freq: Every day | ORAL | Status: DC
  Administered 2016-04-12: 325 mg via ORAL
  Filled 2016-04-12: qty 1

## 2016-04-12 MED ORDER — CIPROFLOXACIN HCL 500 MG PO TABS: 500.0000 mg | ORAL_TABLET | Freq: Two times a day (BID) | ORAL | 0 refills | Status: DC

## 2016-04-12 MED ORDER — SIMETHICONE 80 MG PO CHEW
80.0000 mg | CHEWABLE_TABLET | Freq: Four times a day (QID) | ORAL | Status: DC
  Administered 2016-04-12: 80 mg via ORAL
  Filled 2016-04-12: qty 1

## 2016-04-12 MED ORDER — CIPROFLOXACIN HCL 500 MG PO TABS: 500.0000 mg | ORAL_TABLET | Freq: Two times a day (BID) | ORAL | Status: DC

## 2016-04-12 NOTE — Discharge Summary (Addendum)
[name] [name], is a 70 y.o. female  DOB 1945-12-14  MRN [id number].  Admission date:  04/10/2016  Admitting Physician  Toy Baker, MD  Discharge Date:  04/12/2016   Primary MD  [name], MD  Recommendations for primary care physician for things to follow:  - Please check CBC, BMP during next visit   Admission Diagnosis  Chest pain, unspecified chest pain type [R07.9]   Discharge Diagnosis  Chest pain, unspecified chest pain type [R07.9]    Principal Problem:   Chest pain Active Problems:   Hyperlipidemia   Essential hypertension   Sleep apnea   Anticoagulant long-term use   Atrial fibrillation [facility])      Past Medical History:  Diagnosis Date  . Anemia   . Asthma 06/13/2011   hx of  . Atrial fibrillation ([place])   . Atrial flutter ([place])   . Breast cancer ([place]) dx'd 2005   No BP check or stick in Left arm  . CAD (coronary artery disease)    a. Nonobstructive CAD 2007 (EF 75%.  RCA  proximal 75% tubular, 25% prox LAD, 25% d1, 50% D2) at [facility] b. NSTEMI 01/2010 in setting of respiratory failure, medical approach (not a good candidate for noninvasive eval)  . Cellulitis    hx of cellulitis in left leg  . Cervical radiculopathy 06/13/2011  . COPD (chronic obstructive pulmonary disease) ([place])    on O2  . Depression 06/13/2011  . Diverticulosis   . Esophageal dysmotility   . Gout 06/13/2011  . History of Clostridium difficile early dec 2015   no current diarrhea  . History of home oxygen therapy    2 liters per nasal cannula all the time  . Hx of dysfunctional uterine bleeding    last bleeding jan 2016, worked up for with d and c x 2 no cause found  . Hyperlipidemia   . Hypertension   . Hypothyroidism   . Impaired glucose tolerance 11/01/2012  . Neuropathy ([place]) 06/13/2011  . Obesity hypoventilation syndrome ([place]) 06/13/2011  . OSA on CPAP   . PFO (patent foramen ovale)   . Risk  for falls   . Syncope and collapse    Bradycardia with pauses. S/P pacemaker  . Tubular adenoma of colon     Past Surgical History:  Procedure Laterality Date  . BREAST LUMPECTOMY Left   . CARDIOVERSION N/A 03/15/2013   Procedure: CARDIOVERSION;  Surgeon: Thayer Headings, MD;  Location: [facility] ENDOSCOPY;  Service: Cardiovascular;  Laterality: N/A;  . CERVICAL BIOPSY  June 2013   at [facility] for Heavy  Bleeding  . [place] SURGERY  2004 or 2005   have cadaver bones,, screws, and plates c 5 to c 6  . COLONOSCOPY    . COLONOSCOPY WITH PROPOFOL N/A 10/22/2014   Procedure: COLONOSCOPY WITH PROPOFOL;  Surgeon: Jerene Bears, MD;  Location: [facility] ENDOSCOPY;  Service: Gastroenterology;  Laterality: N/A;  . DILATION AND CURETTAGE OF UTERUS    . LAPAROSCOPIC APPENDECTOMY  03/29/2012   Procedure:  APPENDECTOMY LAPAROSCOPIC;  Surgeon: Adin Hector, MD;  Location: [facility] ORS;  Service: General;  Laterality: N/A;  . LOOP RECORDER EXPLANT  08/31/2013   Procedure: LOOP RECORDER EXPLANT;  Surgeon: Coralyn Mark, MD;  Location: [place] CATH LAB;  Service: Cardiovascular;;  . LOOP RECORDER IMPLANT  08-28-2013; 08-31-2013   MDT LinQ implanted by Dr Rayann Heman for syncope; explanted 08-31-2013 after sinus pauses identified  . LOOP RECORDER IMPLANT N/A 08/28/2013   Procedure: LOOP RECORDER IMPLANT;  Surgeon: Coralyn Mark, MD;  Location: [place] CATH LAB;  Service: Cardiovascular;  Laterality: N/A;  . OVARIAN CYST REMOVAL    . PACEMAKER INSERTION  08-31-2013   Biotronik Evera dual chamber pacemaker placed for neurocardiogenic syncope and sinus pauses by Dr Rayann Heman  . PERMANENT PACEMAKER INSERTION N/A 08/31/2013   Procedure: PERMANENT PACEMAKER INSERTION;  Surgeon: Coralyn Mark, MD;  Location: [place] CATH LAB;  Service: Cardiovascular;  Laterality: N/A;  . TEE WITHOUT CARDIOVERSION N/A 03/15/2013   Procedure: TRANSESOPHAGEAL ECHOCARDIOGRAM (TEE);  Surgeon: Thayer Headings, MD;  Location: [place];  Service: Cardiovascular;   Laterality: N/A;       History of present illness and  Hospital Course:     Kindly see H&P for history of present illness and admission details, please review complete Labs, Consult reports and Test reports for all details in brief  HPI  from the history and physical done on the day of admission 04/11/2016  [name] [name] is a 70 y.o. female with medical history significant of CAD, HTN, OSA, A.fib on eliquis, breast cancer in remission, GERD, remote c.diffinfection, COPD    Presented with chest pain that started while at the [facility]. Describes as dull pain under left breast radiating to the back. No prior similar pains, no associated with SOB, DOe, nausea or fever. Lasted 2 hurs until she presented to ER and have resolved now.. Patient endorses in creased stress lately. She feels that her chest pain is secondary to that.   Regarding pertinent Chronic problems: according to cardiology notes LHC at [facility] 10/2005: pRCA 75%, LM < 25%, pLAD 25%, D1 25%, D2 50%.  NSTEMI in 01/2010 in the setting of SIRS from leg cellulitis. This was felt to be demand ischemia. Med Rx was pursued felt to be a poor candidate for noninvasive imaging. She developed atrial flutter with RVR in June 2014. She was felt to be a poor candidate for atrial flutter ablation. TEE guided cardioversion was performed. She had restoration of NSR. TEE 03/15/13: EF 55-60%, no LA clot, positive PFO,  She developed syncope in 2014. She had an implantable loop placed which demonstrated symptomatic bradycardia with pauses and ultimately had a pacemaker placed  IN ER: afebrile, HR up to 101 Chest pain free ER provider discussed case with Cardiology who recommended admission to medicine   CXR mild pulmonary congestion   Hospital Course   70 y.o.femalewith medical history significant of CAD, HTN, OSA, A.fib on eliquis, breast cancer in remission, GERD, remote c.diffinfection, COPD presents with chest pain.  Chest pain - Cardiology  consult appreciated, her symptoms sound noncardiac, admitted to observation, no events on telemetry, normal EKG, normal cardiac enzymes, symptoms most likely related to reflux , encouraged to continue using PPI, 2-D echo with preserved EF, no wall motion abnormalities, no further inpatient workup planned by cardiology .  Atrial fibrillation - cont with Eliquis and metoprolol.  HTN  - continue with home meds  UTI - Started in [place], to finish total of 3 days  Hyperlipedemia - continue with statins  OSA - continue with CPAP   Discharge Condition:  stable  Follow UP  Follow-up Information    [name], MD .   Specialties:  Internal Medicine, Radiology Contact information: [place] [address] [phone]             Discharge Instructions  and  Discharge Medications     Discharge Instructions    Diet - low sodium heart healthy    Complete by:  As directed   Discharge instructions    Complete by:  As directed   Follow with Primary MD [name], MD in 7 days   Get CBC, CMP,  checked  by Primary MD next visit.    Activity: As tolerated with Full fall precautions use walker/cane & assistance as needed   Disposition Home    Diet: Heart Healthy , with feeding assistance and aspiration precautions.  For Heart failure patients - Check your Weight same time everyday, if you gain over 2 pounds, or you develop in leg swelling, experience more shortness of breath or chest pain, call your Primary MD immediately. Follow Cardiac Low Salt Diet and 1.5 lit/day fluid restriction.   On your next visit with your primary care physician please Get Medicines reviewed and adjusted.   Please request your Prim.MD to go over all Hospital Tests and Procedure/Radiological results at the follow up, please get all Hospital records sent to your Prim MD by signing hospital release before you go home.   If you experience worsening of your admission symptoms,  develop shortness of breath, life threatening emergency, suicidal or homicidal thoughts you must seek medical attention immediately by calling 911 or calling your MD immediately  if symptoms less severe.  You Must read complete instructions/literature along with all the possible adverse reactions/side effects for all the Medicines you take and that have been prescribed to you. Take any new Medicines after you have completely understood and accpet all the possible adverse reactions/side effects.   Do not drive, operating heavy machinery, perform activities at heights, swimming or participation in water activities or provide baby sitting services if your were admitted for syncope or siezures until you have seen by Primary MD or a Neurologist and advised to do so again.  Do not drive when taking Pain medications.    Do not take more than prescribed Pain, Sleep and Anxiety Medications  Special Instructions: If you have smoked or chewed Tobacco  in the last 2 yrs please stop smoking, stop any regular Alcohol  and or any Recreational drug use.  Wear Seat belts while driving.   Please note  You were cared for by a hospitalist during your hospital stay. If you have any questions about your discharge medications or the care you received while you were in the hospital after you are discharged, you can call the unit and asked to speak with the hospitalist on call if the hospitalist that took care of you is not available. Once you are discharged, your primary care physician will handle any further medical issues. Please note that NO REFILLS for any discharge medications will be authorized once you are discharged, as it is imperative that you return to your primary care physician (or establish a relationship with a primary care physician if you do not have one) for your aftercare needs so that they can reassess your need for medications and monitor your lab values.   Increase activity slowly  Complete by:   As directed       Medication List    TAKE these medications   albuterol 108 (90 Base) MCG/ACT inhaler Commonly known as:  PROVENTIL HFA;VENTOLIN HFA Inhale 2 puffs into the lungs every 6 (six) hours as needed for wheezing or shortness of breath.   allopurinol 300 MG tablet Commonly known as:  ZYLOPRIM TAKE 1 TABLET DAILY   amitriptyline 100 MG tablet Commonly known as:  ELAVIL take 1 tablet by mouth at bedtime if needed for sleep   ciprofloxacin 500 MG tablet Commonly known as:  CIPRO Take 1 tablet (500 mg total) by mouth 2 (two) times daily.   diclofenac sodium 1 % Gel Commonly known as:  VOLTAREN Apply 4 g topically 4 (four) times daily as needed.   DULoxetine 30 MG capsule Commonly known as:  CYMBALTA Take 1 capsule (30 mg total) by mouth 2 (two) times daily. Enough until receive mail order   ELIQUIS 5 MG Tabs tablet Generic drug:  apixaban TAKE 1 TABLET TWICE A DAY   ferrous sulfate 325 (65 FE) MG tablet Take 325 mg by mouth daily with breakfast.   FISH OIL PO Take 1 capsule by mouth daily.   furosemide 40 MG tablet Commonly known as:  LASIX Take 1 tablet every morning & 1 tablet every other night   levothyroxine 200 MCG tablet Commonly known as:  SYNTHROID, LEVOTHROID TAKE 1 TABLET DAILY   metoprolol tartrate 25 MG tablet Commonly known as:  LOPRESSOR Take 1 tablet (25 mg total) by mouth 2 (two) times daily. Reported on 02/12/2016   omeprazole 40 MG capsule Commonly known as:  PRILOSEC Take 1 capsule (40 mg total) by mouth daily.   potassium chloride SA 20 MEQ tablet Commonly known as:  K-DUR,KLOR-CON Take 1 tablet (20 mEq total) by mouth 2 (two) times daily.   pravastatin 40 MG tablet Commonly known as:  PRAVACHOL Take 1 tablet (40 mg total) by mouth daily.   traMADol 50 MG tablet Commonly known as:  ULTRAM Take 1 tablet (50 mg total) by mouth every 8 (eight) hours as needed.   vitamin C 500 MG tablet Commonly known as:  ASCORBIC ACID Take  500 mg by mouth daily.   Vitamin D 2000 units tablet Take 2,000 Units by mouth at bedtime.         Diet and Activity recommendation: See Discharge Instructions above   Consults obtained -  cardiology   Major procedures and Radiology Reports - PLEASE review detailed and final reports for all details, in brief -     Dg Chest 2 View  Result Date: 04/10/2016 CLINICAL DATA:  Left-sided chest pain extending through the back. EXAM: CHEST  2 VIEW COMPARISON:  Chest radiographs 04/29/2015 and CT 04/30/2015 FINDINGS: Dual lead pacemaker remains in place. The cardiac silhouette is mildly enlarged. Mild tortuosity of the thoracic aorta is unchanged. The lungs remain hyperinflated. Mild pulmonary vascular congestion is similar to the prior study. There is no evidence of lobar consolidation, overt alveolar edema, pleural effusion, or pneumothorax. Degenerative changes are noted about the shoulders. IMPRESSION: Cardiomegaly and mild pulmonary vascular congestion. Electronically Signed   By: Logan Bores M.D.   On: 04/10/2016 18:24    Micro Results     No results found for this or any previous visit (from the past 240 hour(s)).     Today   Subjective:   [name] [name] today has no headache,no chest abdominal pain,no new weakness tingling or numbness, feels  much better wants to go home today.   Objective:   Blood pressure 96/63, pulse 71, temperature 97.8 F (36.6 C), temperature source Oral, resp. rate 18, height 5' 4"  (1.626 m), weight (!) 146.6 kg (323 lb 1.6 oz), SpO2 95 %.   Intake/Output Summary (Last 24 hours) at 04/12/16 1550 Last data filed at 04/12/16 1403  Gross per 24 hour  Intake             1360 ml  Output              750 ml  Net              610 ml    Exam Awake Alert, Oriented x 3, No new F.N deficits, Normal affect Bernice.AT,PERRAL Supple Neck,No JVD, No cervical lymphadenopathy appriciated.  Symmetrical Chest wall movement, Good air movement bilaterally,  CTAB RRR,No Gallops,Rubs or new Murmurs, No Parasternal Heave +ve B.Sounds, Abd Soft, Non tender, No organomegaly appriciated, No rebound -guarding or rigidity. No Cyanosis, Clubbing or edema, No new Rash or bruise  Data Review   CBC w Diff: Lab Results  Component Value Date   WBC 5.4 04/11/2016   HGB 12.8 04/11/2016   HGB 13.1 06/09/2015   HCT 39.8 04/11/2016   HCT 41.3 06/09/2015   PLT 188 04/11/2016   PLT 150 06/09/2015   LYMPHOPCT 39.1 06/09/2015   MONOPCT 9.8 06/09/2015   EOSPCT 4.9 06/09/2015   BASOPCT 1.3 06/09/2015    CMP: Lab Results  Component Value Date   NA 139 04/11/2016   NA 145 06/09/2015   K 4.0 04/11/2016   K 3.7 06/09/2015   CL 105 04/11/2016   CL 97 (L) 12/28/2012   CO2 29 04/11/2016   CO2 30 (H) 06/09/2015   BUN 8 04/11/2016   BUN 9.1 06/09/2015   CREATININE 1.13 (H) 04/11/2016   CREATININE 0.9 06/09/2015   PROT 7.0 06/09/2015   ALBUMIN 3.8 06/09/2015   BILITOT 0.64 06/09/2015   ALKPHOS 91 06/09/2015   AST 27 06/09/2015   ALT 27 06/09/2015  .   Total Time in preparing paper work, data evaluation and todays exam - 35 minutes  Theodosia Bahena M.D on 04/12/2016 at 3:50 PM  [facility]   Office  [phone]

## 2016-04-12 NOTE — Consult Note (Signed)
   [facility] CM Inpatient Consult   04/12/2016  [name] [name] 1945/12/04 [id number]  Patient screened for potential [place] Management services. Patient is eligible for [facility] Care Management services under patient's Medicare  Plan.  Patient was preparing for her ECHO.  A brochure was left at the bedside. Admitted for observation for chest pain per medical record.    Please place a [facility] Care Management consult or for questions contact:   Natividad Brood, RN BSN [place] [facility] Liaison  [phone] business mobile phone Toll free office [phone]

## 2016-04-12 NOTE — Care Management Note (Signed)
Case Management Note  Patient Details  Name: [name] [name] MRN: [id number] Date of Birth: 1946-01-11  Subjective/Objective:       Admitted as Observation with Chest pain             Action/Plan: Patient is independent of her ADL's, continues to drive and is active in the community; PCP is Dr [name]; Private insurance with Medicare and [facility] with prescription drug coverage; pharmacy of hcoice is [facility]; patient reports no problem getting her medication. No needs identified at this time  Expected Discharge Date:   possibly 04/12/2016               Expected Discharge Plan:  Home/Self Care  Discharge planning Services  CM Consult  Status of Service:  In process, will continue to follow  Sherrilyn Rist [phone] 04/12/2016, 3:42 PM

## 2016-04-12 NOTE — Discharge Instructions (Signed)
Follow with Primary MD [name], MD in 7 days   Get CBC, CMP,  checked  by Primary MD next visit.    Activity: As tolerated with Full fall precautions use walker/cane & assistance as needed   Disposition Home    Diet: Heart Healthy , with feeding assistance and aspiration precautions.  For Heart failure patients - Check your Weight same time everyday, if you gain over 2 pounds, or you develop in leg swelling, experience more shortness of breath or chest pain, call your Primary MD immediately. Follow Cardiac Low Salt Diet and 1.5 lit/day fluid restriction.   On your next visit with your primary care physician please Get Medicines reviewed and adjusted.   Please request your Prim.MD to go over all Hospital Tests and Procedure/Radiological results at the follow up, please get all Hospital records sent to your Prim MD by signing hospital release before you go home.   If you experience worsening of your admission symptoms, develop shortness of breath, life threatening emergency, suicidal or homicidal thoughts you must seek medical attention immediately by calling 911 or calling your MD immediately  if symptoms less severe.  You Must read complete instructions/literature along with all the possible adverse reactions/side effects for all the Medicines you take and that have been prescribed to you. Take any new Medicines after you have completely understood and accpet all the possible adverse reactions/side effects.   Do not drive, operating heavy machinery, perform activities at heights, swimming or participation in water activities or provide baby sitting services if your were admitted for syncope or siezures until you have seen by Primary MD or a Neurologist and advised to do so again.  Do not drive when taking Pain medications.    Do not take more than prescribed Pain, Sleep and Anxiety Medications  Special Instructions: If you have smoked or chewed Tobacco  in the last 2 yrs please  stop smoking, stop any regular Alcohol  and or any Recreational drug use.  Wear Seat belts while driving.   Please note  You were cared for by a hospitalist during your hospital stay. If you have any questions about your discharge medications or the care you received while you were in the hospital after you are discharged, you can call the unit and asked to speak with the hospitalist on call if the hospitalist that took care of you is not available. Once you are discharged, your primary care physician will handle any further medical issues. Please note that NO REFILLS for any discharge medications will be authorized once you are discharged, as it is imperative that you return to your primary care physician (or establish a relationship with a primary care physician if you do not have one) for your aftercare needs so that they can reassess your need for medications and monitor your lab values.

## 2016-04-12 NOTE — Progress Notes (Signed)
Pt has orders to be discharged. Discharge instructions given and pt has no additional questions at this time. Medication regimen reviewed and pt educated. Pt verbalized understanding and has no additional questions. Telemetry box removed. IV removed and site in good condition. Pt stable and waiting for transportation.   Aliyha Fornes RN 

## 2016-04-12 NOTE — Progress Notes (Signed)
  Echocardiogram 2D Echocardiogram has been performed.  Bobbye Charleston 04/12/2016, 2:02 PM

## 2016-04-12 NOTE — Care Management Obs Status (Signed)
[place] NOTIFICATION   Patient Details  Name: [name] [name] MRN: [id number] Date of Birth: 1946-04-11   Medicare Observation Status Notification Given:  Yes    Royston Bake, RN 04/12/2016, 3:41 PM

## 2016-04-12 NOTE — Progress Notes (Signed)
Patient Name: [name] [name] Date of Encounter: 04/12/2016  Primary Cardiologist: Dr. Stanford Breed   Principal Problem:   Chest pain Active Problems:   Hyperlipidemia   Essential hypertension   Sleep apnea   Anticoagulant long-term use   Atrial fibrillation (HCC)    SUBJECTIVE  Had 1 episode of chest pain under L breast yesterday prior to ED visit, radiate to the back, not related to exertion. Had another episode of chest pain last night, given Ginger Ale, burped and symptom improved. Pain not affected by deep inspiration, palpation or body rotation. Says she has been under a lot of stress since her brother died in 11-19-22.   CURRENT MEDS . allopurinol  300 mg Oral Daily  . amitriptyline  100 mg Oral QHS  . apixaban  5 mg Oral BID  . cholecalciferol  2,000 Units Oral QHS  . ciprofloxacin  400 mg Intravenous BID  . DULoxetine  60 mg Oral QHS  . ferrous sulfate  325 mg Oral Q breakfast  . levothyroxine  200 mcg Oral QAC breakfast  . metoprolol tartrate  25 mg Oral BID  . omega-3 acid ethyl esters  2 g Oral Daily  . pantoprazole  40 mg Oral Daily  . potassium chloride SA  20 mEq Oral BID  . pravastatin  40 mg Oral Daily  . vitamin C  500 mg Oral Daily    OBJECTIVE  Vitals:   04/11/16 1139 04/11/16 1142 04/12/16 0010 04/12/16 0513  BP: (!) 151/91 (!) 96/40 123/61 (!) 104/58  Pulse: 78 83 84 72  Resp: 18 18 18 18   Temp: 98.1 F (36.7 C) 97.8 F (36.6 C) 97.7 F (36.5 C) 97.6 F (36.4 C)  TempSrc: Oral Oral Oral Oral  SpO2: 98% 98% 96% 95%  Weight:    (!) 323 lb 1.6 oz (146.6 kg)  Height:        Intake/Output Summary (Last 24 hours) at 04/12/16 1129 Last data filed at 04/12/16 1045  Gross per 24 hour  Intake             1340 ml  Output              850 ml  Net              490 ml   Filed Weights   04/10/16 1735 04/11/16 0300 04/12/16 0513  Weight: (!) 321 lb (145.6 kg) (!) 319 lb 11.2 oz (145 kg) (!) 323 lb 1.6 oz (146.6 kg)    PHYSICAL EXAM  General: Pleasant,  NAD. Neuro: Alert and oriented X 3. Moves all extremities spontaneously. Psych: Normal affect. HEENT:  Normal  Neck: Supple without bruits or JVD. Lungs:  Resp regular and unlabored, CTA. Heart: RRR no s3, s4, or murmurs. Abdomen: Soft, non-tender, non-distended, BS + x 4.  Extremities: No clubbing, cyanosis or edema. DP/PT/Radials 2+ and equal bilaterally.  Accessory Clinical Findings  CBC  Recent Labs  04/10/16 1750 04/11/16 1342  WBC 6.2 5.4  HGB 13.2 12.8  HCT 42.7 39.8  MCV 92.2 91.1  PLT 200 537   Basic Metabolic Panel  Recent Labs  04/10/16 1750 04/11/16 1525  NA 142 139  K 4.2 4.0  CL 106 105  CO2 28 29  GLUCOSE 105* 87  BUN 7 8  CREATININE 1.03* 1.13*  CALCIUM 9.3 9.2   Cardiac Enzymes  Recent Labs  04/11/16 0804 04/11/16 1525  TROPONINI 0.03* <0.03    TELE Atrial paced    ECG  No new EKG  Echocardiogram 03/15/2013  LV EF: 55% -  60%  ------------------------------------------------------------ Indications:   Atrial flutter 427.32.  ------------------------------------------------------------ Study Conclusions  - Left ventricle: Systolic function was normal. The estimated ejection fraction was in the range of 55% to 60%. - Aortic valve: No evidence of vegetation. No evidence of vegetation. - Left atrium: No evidence of thrombus in the atrial cavity or appendage. - Atrial septum: There was a patent foramen ovale. - Tricuspid valve: Mild-moderate regurgitation. Transesophageal echocardiography. 2D and color Doppler. Blood pressure:   115/71. Patient status: Inpatient. Location: Endoscopy.    Radiology/Studies  Dg Chest 2 View  Result Date: 04/10/2016 CLINICAL DATA:  Left-sided chest pain extending through the back. EXAM: CHEST  2 VIEW COMPARISON:  Chest radiographs 04/29/2015 and CT 04/30/2015 FINDINGS: Dual lead pacemaker remains in place. The cardiac silhouette is mildly enlarged. Mild tortuosity of the  thoracic aorta is unchanged. The lungs remain hyperinflated. Mild pulmonary vascular congestion is similar to the prior study. There is no evidence of lobar consolidation, overt alveolar edema, pleural effusion, or pneumothorax. Degenerative changes are noted about the shoulders. IMPRESSION: Cardiomegaly and mild pulmonary vascular congestion. Electronically Signed   By: Logan Bores M.D.   On: 04/10/2016 18:24    ASSESSMENT AND PLAN  1. Atypical chest pain  - symptom atypical, pending echo, if EF normal, unlikely to pursue further workup  - not a good candidate for myoview given her weight and body habitus. If continue to have chest pain, only option is for repeat cath.   2. CAD  - LHC at [facility] 10/2005: pRCA 75%, LM < 25%, pLAD 25%, D1 25%, D2 50%  - NSTEMI in 01/2010 in the setting of SIRS from leg cellulitis. This was felt to be demand ischemia. Med Rx was pursued   3. Paroxysmal aflutter on eliquis  - felt to be a poor candidate for atrial flutter ablation  4. HTN  5. HLD  6. H/o syncope  - implantable loop placed which demonstrated symptomatic bradycardia with pauses and ultimately had a pacemaker placed.  7. UTI: many bacteria, positive nitrite, per hospitalist.    Signed, Almyra Deforest PA-C Pager: [phone]   I have seen and examined the patient along with Almyra Deforest PA-C.  I have reviewed the chart, notes and new data.  I agree with PA/NP's note.  Key new complaints: feels well today. Chest pain last night (very similar to the episode that prompted admission) resolved after drinking soda and burping Key examination changes: obesity limits exam, but no overt hypervolemia Key new findings / data: normal ECG and enzymes, echo pending  PLAN: Overall, her symptoms sound non-cardiac and normal ECG and enzymes are reassuring. Agree that perfusion imaging will be difficult to interpret due to soft tissue attenuation. If echo does not show wall motion abnormalities, no further inpatient  workup is planned from a cardiac point of view.  Sanda Klein, MD, [place] [phone] 04/12/2016, 11:42 AM

## 2016-04-15 ENCOUNTER — Telehealth: Payer: Self-pay | Admitting: *Deleted

## 2016-04-15 NOTE — Telephone Encounter (Signed)
Transition Care Management Follow-up Telephone Call   Date discharged? 04/12/16   How have you been since you were released from the hospital? Pt states she is doing fine   Do you understand why you were in the hospital? YES   Do you understand the discharge instructions? YES   Where were you discharged to? Home   Items Reviewed:  Medications reviewed: YES  Allergies reviewed: YES  Dietary changes reviewed: NO  Referrals reviewed: No referral needed   Functional Questionnaire:   Activities of Daily Living (ADLs):   She states she are independent in the following: ambulation, bathing and hygiene, feeding, continence, grooming, toileting and dressing States she doesn't require    Any transportation issues/concerns?: NO   Any patient concerns? NO   Confirmed importance and date/time of follow-up visits scheduled YES, appt 04/21/16  Provider Appointment booked with Dr. Jenny Reichmann  Confirmed with patient if condition begins to worsen call PCP or go to the ER.  Patient was given the office number and encouraged to call back with question or concerns.  : YES

## 2016-04-21 ENCOUNTER — Ambulatory Visit: Payer: Medicare Other | Admitting: Internal Medicine

## 2016-04-27 ENCOUNTER — Ambulatory Visit (INDEPENDENT_AMBULATORY_CARE_PROVIDER_SITE_OTHER): Payer: Medicare Other | Admitting: Internal Medicine

## 2016-04-27 ENCOUNTER — Other Ambulatory Visit (INDEPENDENT_AMBULATORY_CARE_PROVIDER_SITE_OTHER): Payer: Medicare Other

## 2016-04-27 ENCOUNTER — Encounter: Payer: Self-pay | Admitting: Internal Medicine

## 2016-04-27 VITALS — BP 112/62 | HR 100 | Temp 98.0°F | Resp 20 | Wt 319.0 lb

## 2016-04-27 DIAGNOSIS — I10 Essential (primary) hypertension: Secondary | ICD-10-CM | POA: Diagnosis not present

## 2016-04-27 DIAGNOSIS — R3915 Urgency of urination: Secondary | ICD-10-CM

## 2016-04-27 DIAGNOSIS — F410 Panic disorder [episodic paroxysmal anxiety] without agoraphobia: Secondary | ICD-10-CM | POA: Diagnosis not present

## 2016-04-27 LAB — CBC WITH DIFFERENTIAL/PLATELET
Basophils Absolute: 0 10*3/uL (ref 0.0–0.1)
Basophils Relative: 0.4 % (ref 0.0–3.0)
EOS PCT: 3.9 % (ref 0.0–5.0)
Eosinophils Absolute: 0.2 10*3/uL (ref 0.0–0.7)
HCT: 41.7 % (ref 36.0–46.0)
Hemoglobin: 13.6 g/dL (ref 12.0–15.0)
LYMPHS ABS: 1.9 10*3/uL (ref 0.7–4.0)
Lymphocytes Relative: 31.9 % (ref 12.0–46.0)
MCHC: 32.6 g/dL (ref 30.0–36.0)
MCV: 89.8 fl (ref 78.0–100.0)
MONO ABS: 0.4 10*3/uL (ref 0.1–1.0)
MONOS PCT: 7.7 % (ref 3.0–12.0)
NEUTROS ABS: 3.3 10*3/uL (ref 1.4–7.7)
NEUTROS PCT: 56.1 % (ref 43.0–77.0)
PLATELETS: 207 10*3/uL (ref 150.0–400.0)
RBC: 4.64 Mil/uL (ref 3.87–5.11)
RDW: 15 % (ref 11.5–15.5)
WBC: 5.8 10*3/uL (ref 4.0–10.5)

## 2016-04-27 LAB — BASIC METABOLIC PANEL
BUN: 8 mg/dL (ref 6–23)
CALCIUM: 9.5 mg/dL (ref 8.4–10.5)
CO2: 30 mEq/L (ref 19–32)
CREATININE: 0.99 mg/dL (ref 0.40–1.20)
Chloride: 101 mEq/L (ref 96–112)
GFR: 71.36 mL/min (ref >60.00)
Glucose, Bld: 108 mg/dL — ABNORMAL HIGH (ref 70–99)
Potassium: 4.2 mEq/L (ref 3.5–5.1)
SODIUM: 140 meq/L (ref 135–145)

## 2016-04-27 MED ORDER — CLONAZEPAM 0.5 MG PO TABS: 0.5000 mg | ORAL_TABLET | Freq: Two times a day (BID) | ORAL | 1 refills | Status: DC | PRN

## 2016-04-27 NOTE — Progress Notes (Signed)
Pre visit review using our clinic review tool, if applicable. No additional management support is needed unless otherwise documented below in the visit note. 

## 2016-04-27 NOTE — Progress Notes (Signed)
Subjective:    Patient ID: [name] [name], female    DOB: 1946/07/04, 70 y.o.   MRN: [id number]  HPI  Here to f/u; overall doing ok,  Pt denies chest pain, increasing sob or doe, wheezing, orthopnea, PND, increased LE swelling, palpitations, dizziness or syncope. Was recently hopsd with pain with neg evalution.  Stress testing not felt needed at this time.   Pt denies new neurological symptoms such as new headache, or facial or extremity weakness or numbness.  Pt denies polydipsia, polyuria, or low sugar episode.   Pt denies new neurological symptoms such as new headache, or facial or extremity weakness or numbness.   Pt states overall good compliance with meds, mostly trying to follow appropriate diet, with wt overall stable,  Walks with rollater walker.   Being seen and tx for right frozen shoulder per Dr Tamala Julian, cortisone has helped.  Recent cipro for UTI at last hospn, now symptoms resolved. Denies urinary symptoms such as dysuria, frequency, urgency, flank pain, hematuria or n/v, fever, chills.  Due for f/u cbc and bmp per d/c recommendation.  Also menitons she has not been "as sharp as I was" with some mild less cognitive ability, sometimes feels overwhelmed and overworked which may be the cause, much more family issue recently with stress.  Finished with legal wrangling over a will she was involved in, but things were said about her she has not been able to clear up.  Daughter may be moving away with a grandaughter. Sister angry with her, not talking at all. 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  Occurred same day she found out about her neighbors cancer. Eventually called her family to pick her up, stayed with them a couple of days, then went home and done ok since then. Past Medical History:  Diagnosis Date  . Anemia   . Asthma 06/13/2011   hx of  . Atrial fibrillation ([place])   . Atrial flutter ([place])   .  Breast cancer ([place]) dx'd 2005   No BP check or stick in Left arm  . CAD (coronary artery disease)    a. Nonobstructive CAD 2007 (EF 75%.  RCA  proximal 75% tubular, 25% prox LAD, 25% d1, 50% D2) at [facility]. b. NSTEMI 01/2010 in setting of respiratory failure, medical approach (not a good candidate for noninvasive eval)  . Cellulitis    hx of cellulitis in left leg  . Cervical radiculopathy 06/13/2011  . COPD (chronic obstructive pulmonary disease) ([place])    on O2  . Depression 06/13/2011  . Diverticulosis   . Esophageal dysmotility   . Gout 06/13/2011  . History of Clostridium difficile early dec 2015   no current diarrhea  . History of home oxygen therapy    2 liters per nasal cannula all the time  . Hx of dysfunctional uterine bleeding    last bleeding jan 2016, worked up for with d and c x 2 no cause found  . Hyperlipidemia   . Hypertension   . Hypothyroidism   . Impaired glucose tolerance 11/01/2012  . Neuropathy ([place]) 06/13/2011  . Obesity hypoventilation syndrome ([place]) 06/13/2011  . OSA on CPAP   . PFO (patent foramen ovale)   . Risk for falls   . Syncope and collapse    Bradycardia with pauses. S/P pacemaker  . Tubular adenoma of colon    Past Surgical History:  Procedure  Laterality Date  . BREAST LUMPECTOMY Left   . CARDIOVERSION N/A 03/15/2013   Procedure: CARDIOVERSION;  Surgeon: Thayer Headings, MD;  Location[facility] ENDOSCOPY;  Service: Cardiovascular;  Laterality: N/A;  . CERVICAL BIOPSY  June 2013   at [facility] for Heavy  Bleeding  . [place] SURGERY  2004 or 2005   have cadaver bones,, screws, and plates c 5 to c 6  . COLONOSCOPY    . COLONOSCOPY WITH PROPOFOL N/A 10/22/2014   Procedure: COLONOSCOPY WITH PROPOFOL;  Surgeon: Jerene Bears, MD;  Location: [facility] ENDOSCOPY;  Service: Gastroenterology;  Laterality: N/A;  . DILATION AND CURETTAGE OF UTERUS    . LAPAROSCOPIC APPENDECTOMY  03/29/2012   Procedure: APPENDECTOMY LAPAROSCOPIC;  Surgeon: Adin Hector, MD;  Location: [facility]  ORS;  Service: General;  Laterality: N/A;  . LOOP RECORDER EXPLANT  08/31/2013   Procedure: LOOP RECORDER EXPLANT;  Surgeon: Coralyn Mark, MD;  Location: [facility] CATH LAB;  Service: Cardiovascular;;  . LOOP RECORDER IMPLANT  08-28-2013; 08-31-2013   MDT LinQ implanted by Dr Rayann Heman for syncope; explanted 08-31-2013 after sinus pauses identified  . LOOP RECORDER IMPLANT N/A 08/28/2013   Procedure: LOOP RECORDER IMPLANT;  Surgeon: Coralyn Mark, MD;  Location: [place] CATH LAB;  Service: Cardiovascular;  Laterality: N/A;  . OVARIAN CYST REMOVAL    . PACEMAKER INSERTION  08-31-2013   Biotronik Evera dual chamber pacemaker placed for neurocardiogenic syncope and sinus pauses by Dr Rayann Heman  . PERMANENT PACEMAKER INSERTION N/A 08/31/2013   Procedure: PERMANENT PACEMAKER INSERTION;  Surgeon: Coralyn Mark, MD;  Location: [place] CATH LAB;  Service: Cardiovascular;  Laterality: N/A;  . TEE WITHOUT CARDIOVERSION N/A 03/15/2013   Procedure: TRANSESOPHAGEAL ECHOCARDIOGRAM (TEE);  Surgeon: Thayer Headings, MD;  Location[facility] ENDOSCOPY;  Service: Cardiovascular;  Laterality: N/A;    reports that she quit smoking about 17 years ago. Her smoking use included Cigarettes. She has a 20.00 pack-year smoking history. She has never used smokeless tobacco. She reports that she drinks about 0.6 oz of alcohol per week . She reports that she does not use drugs. family history includes Breast cancer in her maternal aunt; Breast cancer (age of onset: 70) in her sister; Breast cancer (age of onset: 68) in her cousin; Diabetes in her brother; Heart disease in her mother; Ovarian cancer in her sister and sister; Uterine cancer in her mother. Allergies  Allergen Reactions  . Cephalexin Other (See Comments)    Exfoliative dermatitis reaction  . Codeine     REACTION: unknown - pt. states unaware of having reaction to codeine  . Dilaudid [Hydromorphone] Other (See Comments)    Other Reaction: Oversedation  . Lisinopril     REACTION: cough    . Penicillins     rash  . Cleocin [Clindamycin Hcl] Rash    rash   Current Outpatient Prescriptions on File Prior to Visit  Medication Sig Dispense Refill  . albuterol (PROVENTIL HFA;VENTOLIN HFA) 108 (90 Base) MCG/ACT inhaler Inhale 2 puffs into the lungs every 6 (six) hours as needed for wheezing or shortness of breath. 18 g 1  . allopurinol (ZYLOPRIM) 300 MG tablet TAKE 1 TABLET DAILY 90 tablet 1  . amitriptyline (ELAVIL) 100 MG tablet take 1 tablet by mouth at bedtime if needed for sleep 90 tablet 1  . Cholecalciferol (VITAMIN D) 2000 UNITS tablet Take 2,000 Units by mouth at bedtime.    . diclofenac sodium (VOLTAREN) 1 % GEL Apply 4 g topically 4 (four) times  daily as needed. 400 g 5  . DULoxetine (CYMBALTA) 30 MG capsule Take 1 capsule (30 mg total) by mouth 2 (two) times daily. Enough until receive mail order 30 capsule 0  . ELIQUIS 5 MG TABS tablet TAKE 1 TABLET TWICE A DAY 180 tablet 3  . ferrous sulfate 325 (65 FE) MG tablet Take 325 mg by mouth daily with breakfast.    . furosemide (LASIX) 40 MG tablet Take 1 tablet every morning & 1 tablet every other night 180 tablet 0  . levothyroxine (SYNTHROID, LEVOTHROID) 200 MCG tablet TAKE 1 TABLET DAILY 90 tablet 3  . metoprolol tartrate (LOPRESSOR) 25 MG tablet Take 1 tablet (25 mg total) by mouth 2 (two) times daily. Reported on 02/12/2016 180 tablet 1  . Omega-3 Fatty Acids (FISH OIL PO) Take 1 capsule by mouth daily.     [name] omeprazole (PRILOSEC) 40 MG capsule Take 1 capsule (40 mg total) by mouth daily. 90 capsule 3  . potassium chloride SA (K-DUR,KLOR-CON) 20 MEQ tablet Take 1 tablet (20 mEq total) by mouth 2 (two) times daily. 60 tablet 0  . pravastatin (PRAVACHOL) 40 MG tablet Take 1 tablet (40 mg total) by mouth daily. 90 tablet 0  . traMADol (ULTRAM) 50 MG tablet Take 1 tablet (50 mg total) by mouth every 8 (eight) hours as needed. 90 tablet 2  . vitamin C (ASCORBIC ACID) 500 MG tablet Take 500 mg by mouth daily.      No current  facility-administered medications on file prior to visit.      Review of Systems  Constitutional: Negative for unusual diaphoresis or night sweats HENT: Negative for ear swelling or discharge Eyes: Negative for worsening visual haziness  Respiratory: Negative for choking and stridor.   Gastrointestinal: Negative for distension or worsening eructation Genitourinary: Negative for retention or change in urine volume.  Musculoskeletal: Negative for other MSK pain or swelling Skin: Negative for color change and worsening wound Neurological: Negative for tremors and numbness other than noted  Psychiatric/Behavioral: Negative for decreased concentration or agitation other than above       Objective:   Physical Exam BP 112/62   Pulse 100   Temp 98 F (36.7 C) (Oral)   Resp 20   Wt (!) 319 lb (144.7 kg)   SpO2 98%   BMI 54.76 kg/m  VS noted, walks with rollater Constitutional: Pt appears in no apparent distress HENT: Head: NCAT.  Right Ear: External ear normal.  Left Ear: External ear normal.  Eyes: . Pupils are equal, round, and reactive to light. Conjunctivae and EOM are normal Neck: Normal range of motion. Neck supple.  Cardiovascular: Normal rate and regular rhythm.   Pulmonary/Chest: Effort normal and breath sounds without rales or wheezing.  Abd:  Soft, NT, ND, + BS Neurological: Pt is alert. Not confused , motor grossly intact Skin: Skin is warm. No rash, no LE edema Psychiatric: Pt behavior is normal. No agitation. 1-2+ nervous     Assessment & Plan:

## 2016-04-27 NOTE — Patient Instructions (Addendum)
Please take all new medication as prescribed  - the klonopin only if needed for panic  Please continue all other medications as before, and refills have been done if requested.  Please have the pharmacy call with any other refills you may need.  Please continue your efforts at being more active, low cholesterol diet, and weight control.  You are otherwise up to date with prevention measures today.  Please keep your appointments with your specialists as you may have planned  Please go to the LAB in the Basement (turn left off the elevator) for the tests to be done today  You will be contacted by phone if any changes need to be made immediately.  Otherwise, you will receive a letter about your results with an explanation, but please check with MyChart first.  Please remember to sign up for MyChart if you have not done so, as this will be important to you in the future with finding out test results, communicating by private email, and scheduling acute appointments online when needed.  Please return in 6 months, or sooner if needed, with Lab testing done 3-5 days before

## 2016-05-03 NOTE — Assessment & Plan Note (Signed)
Mild to mod, for klonopin prn, to f/u any worsening symptoms or concerns

## 2016-05-03 NOTE — Assessment & Plan Note (Addendum)
Resolved with recent uti tx,  to f/u any worsening symptoms or concerns, for f/u labs

## 2016-05-03 NOTE — Assessment & Plan Note (Signed)
stable overall by history and exam, recent data reviewed with pt, and pt to continue medical treatment as before,  to f/u any worsening symptoms or concerns BP Readings from Last 3 Encounters:  04/27/16 112/62  04/12/16 96/63  02/12/16 122/66

## 2016-05-07 ENCOUNTER — Other Ambulatory Visit: Payer: Self-pay | Admitting: Cardiology

## 2016-05-07 NOTE — Telephone Encounter (Signed)
Rx request sent to pharmacy.  

## 2016-05-29 ENCOUNTER — Other Ambulatory Visit: Payer: Self-pay | Admitting: Internal Medicine

## 2016-06-08 ENCOUNTER — Encounter: Payer: Self-pay | Admitting: Hematology

## 2016-06-08 ENCOUNTER — Other Ambulatory Visit (HOSPITAL_BASED_OUTPATIENT_CLINIC_OR_DEPARTMENT_OTHER): Payer: Medicare Other

## 2016-06-08 ENCOUNTER — Ambulatory Visit (HOSPITAL_BASED_OUTPATIENT_CLINIC_OR_DEPARTMENT_OTHER): Payer: Medicare Other | Admitting: Hematology

## 2016-06-08 DIAGNOSIS — I4891 Unspecified atrial fibrillation: Secondary | ICD-10-CM

## 2016-06-08 DIAGNOSIS — R921 Mammographic calcification found on diagnostic imaging of breast: Secondary | ICD-10-CM

## 2016-06-08 DIAGNOSIS — Z853 Personal history of malignant neoplasm of breast: Secondary | ICD-10-CM | POA: Diagnosis not present

## 2016-06-08 DIAGNOSIS — I2510 Atherosclerotic heart disease of native coronary artery without angina pectoris: Secondary | ICD-10-CM

## 2016-06-08 DIAGNOSIS — J449 Chronic obstructive pulmonary disease, unspecified: Secondary | ICD-10-CM

## 2016-06-08 DIAGNOSIS — M545 Low back pain: Secondary | ICD-10-CM

## 2016-06-08 LAB — COMPREHENSIVE METABOLIC PANEL
ALT: 26 U/L (ref 0–55)
AST: 31 U/L (ref 5–34)
Albumin: 3.5 g/dL (ref 3.5–5.0)
Alkaline Phosphatase: 106 U/L (ref 40–150)
Anion Gap: 12 mEq/L — ABNORMAL HIGH (ref 3–11)
BUN: 8.1 mg/dL (ref 7.0–26.0)
CHLORIDE: 104 meq/L (ref 98–109)
CO2: 30 mEq/L — ABNORMAL HIGH (ref 22–29)
CREATININE: 1 mg/dL (ref 0.6–1.1)
Calcium: 9 mg/dL (ref 8.4–10.4)
EGFR: 64 mL/min/{1.73_m2} — ABNORMAL LOW (ref >90)
GLUCOSE: 88 mg/dL (ref 70–140)
POTASSIUM: 3.7 meq/L (ref 3.5–5.1)
Sodium: 146 mEq/L — ABNORMAL HIGH (ref 136–145)
Total Bilirubin: 0.39 mg/dL (ref 0.20–1.20)
Total Protein: 7.2 g/dL (ref 6.4–8.3)

## 2016-06-08 LAB — CBC WITH DIFFERENTIAL/PLATELET
BASO%: 1.1 % (ref 0.0–2.0)
BASOS ABS: 0.1 10*3/uL (ref 0.0–0.1)
EOS%: 4.1 % (ref 0.0–7.0)
Eosinophils Absolute: 0.2 10*3/uL (ref 0.0–0.5)
HEMATOCRIT: 42.4 % (ref 34.8–46.6)
HGB: 13.3 g/dL (ref 11.6–15.9)
LYMPH#: 1.7 10*3/uL (ref 0.9–3.3)
LYMPH%: 29.9 % (ref 14.0–49.7)
MCH: 28.9 pg (ref 25.1–34.0)
MCHC: 31.4 g/dL — AB (ref 31.5–36.0)
MCV: 91.8 fL (ref 79.5–101.0)
MONO#: 0.4 10*3/uL (ref 0.1–0.9)
MONO%: 7 % (ref 0.0–14.0)
NEUT#: 3.2 10*3/uL (ref 1.5–6.5)
NEUT%: 57.9 % (ref 38.4–76.8)
Platelets: 154 10*3/uL (ref 145–400)
RBC: 4.62 10*6/uL (ref 3.70–5.45)
RDW: 15.4 % — ABNORMAL HIGH (ref 11.2–14.5)
WBC: 5.6 10*3/uL (ref 3.9–10.3)

## 2016-06-08 NOTE — Progress Notes (Signed)
[facility] OFFICE PROGRESS NOTE   [name], MD [place] [address]  DIAGNOSIS: History of left breast cancer - Plan: CBC with Differential, Comprehensive metabolic panel  Chief Complaint  Patient presents with  . Follow-up       CURRENT THERAPY: Surveillance  INTERVAL HISTORY:  [name] [name] 70 y.o. female with history of infiltrating ductal carcinoma of the left breast (August 2006) who received all of her care for her breast cancer at [facility] is here for followup.  She was last seen by me one year ago.   She was admitted to hospital in July 2017 for atypical chest pain and was also treated for UTI. She has chronic low back pain, and lives independently. She uses a walker when she goes out, she is able to do everything for her self, she also goes to gym and do water exercise.    MEDICAL HISTORY: Past Medical History:  Diagnosis Date  . Anemia   . Asthma 06/13/2011   hx of  . Atrial fibrillation ([place])   . Atrial flutter ([place])   . Breast cancer ([place]) dx'd 2005   No BP check or stick in Left arm  . CAD (coronary artery disease)    a. Nonobstructive CAD 2007 (EF 75%.  RCA  proximal 75% tubular, 25% prox LAD, 25% d1, 50% D2) at [facility]. b. NSTEMI 01/2010 in setting of respiratory failure, medical approach (not a good candidate for noninvasive eval)  . Cellulitis    hx of cellulitis in left leg  . Cervical radiculopathy 06/13/2011  . COPD (chronic obstructive pulmonary disease) ([facility])    on O2  . Depression 06/13/2011  . Diverticulosis   . Esophageal dysmotility   . Gout 06/13/2011  . History of Clostridium difficile early dec 2015   no current diarrhea  . History of home oxygen therapy    2 liters per nasal cannula all the time  . Hx of dysfunctional uterine bleeding    last bleeding jan 2016, worked up for with d and c x 2 no cause found  . Hyperlipidemia   . Hypertension   . Hypothyroidism   . Impaired glucose tolerance 11/01/2012   . Neuropathy ([place]) 06/13/2011  . Obesity hypoventilation syndrome ([place]) 06/13/2011  . OSA on CPAP   . PFO (patent foramen ovale)   . Risk for falls   . Syncope and collapse    Bradycardia with pauses. S/P pacemaker  . Tubular adenoma of colon     ALLERGIES:  is allergic to cephalexin; codeine; dilaudid [hydromorphone]; lisinopril; penicillins; and cleocin [clindamycin hcl].  MEDICATIONS: Current Outpatient Prescriptions on File Prior to Visit  Medication Sig Dispense Refill  . albuterol (PROVENTIL HFA;VENTOLIN HFA) 108 (90 Base) MCG/ACT inhaler Inhale 2 puffs into the lungs every 6 (six) hours as needed for wheezing or shortness of breath. 18 g 1  . allopurinol (ZYLOPRIM) 300 MG tablet TAKE 1 TABLET DAILY 90 tablet 1  . amitriptyline (ELAVIL) 100 MG tablet take 1 tablet by mouth at bedtime if needed for sleep 90 tablet 1  . Cholecalciferol (VITAMIN D) 2000 UNITS tablet Take 2,000 Units by mouth at bedtime.    . clonazePAM (KLONOPIN) 0.5 MG tablet Take 1 tablet (0.5 mg total) by mouth 2 (two) times daily as needed for anxiety. 60 tablet 1  . diclofenac sodium (VOLTAREN) 1 % GEL Apply 4 g topically 4 (four) times daily as needed. 400 g 5  . DULoxetine (  CYMBALTA) 30 MG capsule Take 1 capsule (30 mg total) by mouth 2 (two) times daily. Enough until receive mail order 30 capsule 0  . ELIQUIS 5 MG TABS tablet TAKE 1 TABLET TWICE A DAY 180 tablet 3  . ferrous sulfate 325 (65 FE) MG tablet Take 325 mg by mouth daily with breakfast.    . furosemide (LASIX) 40 MG tablet TAKE 1 TABLET EVERY MORNING AND 1 TABLET EVERY OTHER NIGHT 180 tablet 0  . levothyroxine (SYNTHROID, LEVOTHROID) 200 MCG tablet TAKE 1 TABLET DAILY 90 tablet 3  . metoprolol tartrate (LOPRESSOR) 25 MG tablet Take 1 tablet (25 mg total) by mouth 2 (two) times daily. Reported on 02/12/2016 180 tablet 1  . Omega-3 Fatty Acids (FISH OIL PO) Take 1 capsule by mouth daily.     [name] omeprazole (PRILOSEC) 40 MG capsule Take 1 capsule (40 mg  total) by mouth daily. 90 capsule 3  . potassium chloride SA (K-DUR,KLOR-CON) 20 MEQ tablet Take 1 tablet (20 mEq total) by mouth 2 (two) times daily. Please schedule appointment for refills. 60 tablet 0  . pravastatin (PRAVACHOL) 40 MG tablet Take 1 tablet (40 mg total) by mouth daily. 90 tablet 0  . traMADol (ULTRAM) 50 MG tablet Take 1 tablet (50 mg total) by mouth every 8 (eight) hours as needed. 90 tablet 2  . vitamin C (ASCORBIC ACID) 500 MG tablet Take 500 mg by mouth daily.      No current facility-administered medications on file prior to visit.   ;  SURGICAL HISTORY:  Past Surgical History:  Procedure Laterality Date  . BREAST LUMPECTOMY Left   . CARDIOVERSION N/A 03/15/2013   Procedure: CARDIOVERSION;  Surgeon: Thayer Headings, MD;  Location: [facility] ENDOSCOPY;  Service: Cardiovascular;  Laterality: N/A;  . CERVICAL BIOPSY  June 2013   at [facility] for Heavy  Bleeding  . [place] SURGERY  2004 or 2005   have cadaver bones,, screws, and plates c 5 to c 6  . COLONOSCOPY    . COLONOSCOPY WITH PROPOFOL N/A 10/22/2014   Procedure: COLONOSCOPY WITH PROPOFOL;  Surgeon: Jerene Bears, MD;  Location: [facility] ENDOSCOPY;  Service: Gastroenterology;  Laterality: N/A;  . DILATION AND CURETTAGE OF UTERUS    . LAPAROSCOPIC APPENDECTOMY  03/29/2012   Procedure: APPENDECTOMY LAPAROSCOPIC;  Surgeon: Adin Hector, MD;  Location: [facility] ORS;  Service: General;  Laterality: N/A;  . LOOP RECORDER EXPLANT  08/31/2013   Procedure: LOOP RECORDER EXPLANT;  Surgeon: Coralyn Mark, MD;  Location: [name] CATH LAB;  Service: Cardiovascular;;  . LOOP RECORDER IMPLANT  08-28-2013; 08-31-2013   MDT LinQ implanted by Dr Rayann Heman for syncope; explanted 08-31-2013 after sinus pauses identified  . LOOP RECORDER IMPLANT N/A 08/28/2013   Procedure: LOOP RECORDER IMPLANT;  Surgeon: Coralyn Mark, MD;  Location: [facility] CATH LAB;  Service: Cardiovascular;  Laterality: N/A;  . OVARIAN CYST REMOVAL    . PACEMAKER INSERTION  08-31-2013    Biotronik Evera dual chamber pacemaker placed for neurocardiogenic syncope and sinus pauses by Dr Rayann Heman  . PERMANENT PACEMAKER INSERTION N/A 08/31/2013   Procedure: PERMANENT PACEMAKER INSERTION;  Surgeon: Coralyn Mark, MD;  Location: [facility] CATH LAB;  Service: Cardiovascular;  Laterality: N/A;  . TEE WITHOUT CARDIOVERSION N/A 03/15/2013   Procedure: TRANSESOPHAGEAL ECHOCARDIOGRAM (TEE);  Surgeon: Thayer Headings, MD;  Location: [facility] ENDOSCOPY;  Service: Cardiovascular;  Laterality: N/A;    REVIEW OF SYSTEMS:   Constitutional: Denies fevers, chills or abnormal weight loss Eyes: Denies  blurriness of vision Ears, nose, mouth, throat, and face: Denies mucositis or sore throat Respiratory: Denies cough, dyspnea or wheezes Cardiovascular: Denies palpitation, chest discomfort or lower extremity swelling Gastrointestinal:  Denies nausea, heartburn or change in bowel habits Skin: Denies abnormal skin rashes Lymphatics: Denies new lymphadenopathy or easy bruising Neurological:She reports numbness, tingling of her hands and feets but denies new weaknesses Behavioral/Psych: Mood is stable, no new changes  All other systems were reviewed with the patient and are negative.  PHYSICAL EXAMINATION: ECOG PERFORMANCE STATUS: 1 Blood pressure 114/72, heart rate 93, pulse ox 94% on room air, weight 329.8 pounds GENERAL:alert, no distress and comfortable; Morbidly obese. SKIN: skin color, texture, turgor are normal, no rashes or significant lesions EYES: normal, Conjunctiva are pink and non-injected, sclera clear OROPHARYNX:no exudate, no erythema and lips, buccal mucosa, and tongue normal  NECK: supple, thyroid normal size, non-tender, without nodularity LYMPH:  no palpable lymphadenopathy in the cervical, axillary or supraclavicular LUNGS: clear to auscultation and percussion with normal breathing effort HEART: regular rate & rhythm and no murmurs and no chronic pitting extremity edema BREASTS: Large, right  breast is benign, no palpable mass.  Left breast shows the effects of radiation with skin changes but no suspicious finding suggestive of recurrence.  No axillary adenopathy.  ABDOMEN:abdomen soft, non-tender and normal bowel sounds Musculoskeletal:no cyanosis of digits and no clubbing  NEURO: alert & oriented x 3 with fluent speech, no focal motor/sensory deficits  Labs:  CBC Latest Ref Rng & Units 06/08/2016 04/27/2016 04/11/2016  WBC 3.9 - 10.3 10e3/uL 5.6 5.8 5.4  Hemoglobin 11.6 - 15.9 g/dL 13.3 13.6 12.8  Hematocrit 34.8 - 46.6 % 42.4 41.7 39.8  Platelets 145 - 400 10e3/uL 154 207.0 188    CMP Latest Ref Rng & Units 06/08/2016 04/27/2016 04/11/2016  Glucose 70 - 140 mg/dl 88 108(H) 87  BUN 7.0 - 26.0 mg/dL 8.1 8 8   Creatinine 0.6 - 1.1 mg/dL 1.0 0.99 1.13(H)  Sodium 136 - 145 mEq/L 146(H) 140 139  Potassium 3.5 - 5.1 mEq/L 3.7 4.2 4.0  Chloride 96 - 112 mEq/L - 101 105  CO2 22 - 29 mEq/L 30(H) 30 29  Calcium 8.4 - 10.4 mg/dL 9.0 9.5 9.2  Total Protein 6.4 - 8.3 g/dL 7.2 - -  Total Bilirubin 0.20 - 1.20 mg/dL 0.39 - -  Alkaline Phos 40 - 150 U/L 106 - -  AST 5 - 34 U/L 31 - -  ALT 0 - 55 U/L 26 - -     RADIOGRAPHIC STUDIES: Diagnostic left breast mammogram IMPRESSION: 1. Several loosely grouped punctate and coarse calcifications within the inner left breast at the surgical site, morphology better appreciated on the CC projection. These are probably benign calcifications for which a six-month follow-up left breast diagnostic mammogram, with magnification views, is recommended. 2. Left breast retroareolar asymmetry compatible with superimposition of normal dense fibroglandular tissues on today's additional views with tomosynthesis. Attention to this area also recommended on the six-month follow-up exam to ensure stability.  RECOMMENDATION: Follow-up left breast diagnostic mammogram, with possible ultrasound, in 6 months.  PATHOLOGY: Diagnosis Endometrium, biopsy -  DEGENERATING SECRETORY-TYPE ENDOMETRIUM. - THERE IS NO EVIDENCE OF HYPERPLASIA OR MALIGNANCY. - SEE COMMENT. Microscopic Comment This pattern can be associated with menses, irregular shedding or breakthrough bleeding associated with hormonal therapy. Clinical correlation is suggested. (JBK:caf 05/14/13) Enid Cutter MD Pathologist, Electronic Signature (Case signed 05/14/2013)   RADIOLOGY REPORTS: MM DIAG BREAST TOMO UNI LEFT 12/02/2015 IMPRESSION: Unchanged probably benign left  breast asymmetry and left breast calcifications. There are no mammographic findings of malignancy in the left breast.  RECOMMENDATION: Bilateral diagnostic mammography September 2017 to demonstrate 1 year stability of the probably benign left breast calcifications in left breast asymmetry  ASSESSMENT: Rich Number 70 y.o. female with a history of History of left breast cancer - Plan: CBC with Differential, Comprehensive metabolic panel  PLAN:  1. T2N0, stage IIA triple negative infiltrating ductal carcinoma of the left breast (2006).  --She is now over 10 years from the time of diagnosis without evidence of disease.  -We discussed that the risk of cancer recurrence was triple-negative breast cancer is minimal now, but she is at risk for a second breast cancer and we will continue surveillance. -Her last mammogram in March 2017 showed probably benign left breast asymmetry and calcification. She is due for bilateral mammogram this month, I'll schedule for her. -Lab results discussed with her, unremarkable CBC and CMP -I encouraged her to continue healthy diet and a regular exercise. I also encouraged her to try to lose some weight.  2. Left breast calcification -This was found on the screening mammogram in September 2016, and diagnostic mammogram, stable in 11/2015  -She will have a repeated mammogram this month   3. Chronic low back pain, atrial fibrillation, CAD, COPD, morbid obesity,  -She will continue  follow-up with her primary care physician   Plan -Diagnostic b/l mammogram within one month  -I'll see her back in one year.  All questions were answered. The patient knows to call the clinic with any problems, questions or concerns. We can certainly see the patient much sooner if necessary.  The patient was provided an after visit summary.   I spent 15 minutes counseling the patient face to face. The total time spent in the appointment was 25 minutes.    Truitt Merle  06/08/2016

## 2016-06-09 ENCOUNTER — Encounter: Payer: Self-pay | Admitting: Adult Health

## 2016-06-11 ENCOUNTER — Ambulatory Visit (INDEPENDENT_AMBULATORY_CARE_PROVIDER_SITE_OTHER): Payer: Medicare Other | Admitting: Adult Health

## 2016-06-11 ENCOUNTER — Encounter: Payer: Self-pay | Admitting: Adult Health

## 2016-06-11 DIAGNOSIS — J449 Chronic obstructive pulmonary disease, unspecified: Secondary | ICD-10-CM | POA: Diagnosis not present

## 2016-06-11 DIAGNOSIS — Z23 Encounter for immunization: Secondary | ICD-10-CM | POA: Diagnosis not present

## 2016-06-11 DIAGNOSIS — G4730 Sleep apnea, unspecified: Secondary | ICD-10-CM

## 2016-06-11 MED ORDER — ALBUTEROL SULFATE HFA 108 (90 BASE) MCG/ACT IN AERS: 2.0000 | INHALATION_SPRAY | Freq: Four times a day (QID) | RESPIRATORY_TRACT | 3 refills | Status: DC | PRN

## 2016-06-11 NOTE — Progress Notes (Signed)
Reviewed & agree with plan  

## 2016-06-11 NOTE — Patient Instructions (Addendum)
Continue on CPAP At bedtime  -keep up good work  Work on weight loss.  Do not drive if sleepy.  Flu shot today  Activity as tolerated.  Ventolin As needed  For wheezing/shortness of breathing .  Follow up Dr. Elsworth Soho  In  6 months  and As needed

## 2016-06-11 NOTE — Addendum Note (Signed)
Addended by: Benson Setting L on: 06/11/2016 09:53 AM   Modules accepted: Orders

## 2016-06-11 NOTE — Progress Notes (Signed)
Subjective:    Patient ID: [name] [name], female    DOB: 1946-07-24, 70 y.o.   MRN: [id number]  HPI PCP - Jenny Reichmann   70 year old morbidly obese female ex smoker (quit 2000) for FU of copd & OSA on 10 cm.  PMH -sleep apnea, hypertension,breast cancer with left lumpectomy and hypothyroidism , dCHF , PFO    TEST :   Duplex neg DVT, CT angio neg PE, echo >> nml LV fn, mod LVH.   PFTs showed moderate airway obstruction FEV1 48%, no BD response, mild restriction, DLCO corrects for alveolar volume c/w obesity  PSG 03/30/10 surprisingly showed AHI 0.7/h & RDI 9/h, 36 RERAs over 249 mins TST, lowest desatn 92% on 2 L [place]  July 2011 -ONO showed significant desaturation for > 4h  ECHO TEE >EF ok, PFO +bubble   06/11/2016 Follow up : OSA /OHS  Pt returns for 4 month follow up .  She says she  Is doing well on CPAP .  Wearing CPAP At bedtime  , wears for 8hr each night.  Feels rested. Says she is doing very well, feels best in long time.  Download shows excellent compliance with average usage at 8.5 hours. She is on a set pressure at 10 cm of H2O. AHI 0.3. Leaks. Minimum. She is now with [employer] . Getting better customer service.  She denies any chest pain, orthopnea, PND, or increased leg swelling  Has some moderate Moderate COPD , previously on Symbicort and spriiva did not seem to make any difference. Does not like to take a lot of meds esp if they are not helping.  Uses ventolin on occasion . No flare of cough, wheeizng or dyspnea.      Past Medical History:  Diagnosis Date  . Anemia   . Asthma 06/13/2011   hx of  . Atrial fibrillation ([place])   . Atrial flutter ([place])   . Breast cancer ([place]) dx'd 2005   No BP check or stick in Left arm  . CAD (coronary artery disease)    a. Nonobstructive CAD 2007 (EF 75%.  RCA  proximal 75% tubular, 25% prox LAD, 25% d1, 50% D2) at [facility]. b. NSTEMI 01/2010 in setting of respiratory failure, medical approach (not a good candidate for noninvasive eval)  .  Cellulitis    hx of cellulitis in left leg  . Cervical radiculopathy 06/13/2011  . COPD (chronic obstructive pulmonary disease) ([place])    on O2  . Depression 06/13/2011  . Diverticulosis   . Esophageal dysmotility   . Gout 06/13/2011  . History of Clostridium difficile early dec 2015   no current diarrhea  . History of home oxygen therapy    2 liters per nasal cannula all the time  . Hx of dysfunctional uterine bleeding    last bleeding jan 2016, worked up for with d and c x 2 no cause found  . Hyperlipidemia   . Hypertension   . Hypothyroidism   . Impaired glucose tolerance 11/01/2012  . Neuropathy ([place]) 06/13/2011  . Obesity hypoventilation syndrome ([place]) 06/13/2011  . OSA on CPAP   . PFO (patent foramen ovale)   . Risk for falls   . Syncope and collapse    Bradycardia with pauses. S/P pacemaker  . Tubular adenoma of colon    . Current Outpatient Prescriptions on File Prior to Visit  Medication Sig Dispense Refill  . albuterol (PROVENTIL HFA;VENTOLIN HFA) 108 (90 Base) MCG/ACT inhaler Inhale 2 puffs into the lungs every 6 (  six) hours as needed for wheezing or shortness of breath. 18 g 1  . allopurinol (ZYLOPRIM) 300 MG tablet TAKE 1 TABLET DAILY 90 tablet 1  . amitriptyline (ELAVIL) 100 MG tablet take 1 tablet by mouth at bedtime if needed for sleep 90 tablet 1  . Cholecalciferol (VITAMIN D) 2000 UNITS tablet Take 2,000 Units by mouth at bedtime.    . clonazePAM (KLONOPIN) 0.5 MG tablet Take 1 tablet (0.5 mg total) by mouth 2 (two) times daily as needed for anxiety. 60 tablet 1  . diclofenac sodium (VOLTAREN) 1 % GEL Apply 4 g topically 4 (four) times daily as needed. 400 g 5  . DULoxetine (CYMBALTA) 30 MG capsule Take 1 capsule (30 mg total) by mouth 2 (two) times daily. Enough until receive mail order 30 capsule 0  . ELIQUIS 5 MG TABS tablet TAKE 1 TABLET TWICE A DAY 180 tablet 3  . ferrous sulfate 325 (65 FE) MG tablet Take 325 mg by mouth daily with breakfast.    . furosemide  (LASIX) 40 MG tablet TAKE 1 TABLET EVERY MORNING AND 1 TABLET EVERY OTHER NIGHT 180 tablet 0  . levothyroxine (SYNTHROID, LEVOTHROID) 200 MCG tablet TAKE 1 TABLET DAILY 90 tablet 3  . metoprolol tartrate (LOPRESSOR) 25 MG tablet Take 1 tablet (25 mg total) by mouth 2 (two) times daily. Reported on 02/12/2016 180 tablet 1  . Omega-3 Fatty Acids (FISH OIL PO) Take 1 capsule by mouth daily.     [name] omeprazole (PRILOSEC) 40 MG capsule Take 1 capsule (40 mg total) by mouth daily. 90 capsule 3  . potassium chloride SA (K-DUR,KLOR-CON) 20 MEQ tablet Take 1 tablet (20 mEq total) by mouth 2 (two) times daily. Please schedule appointment for refills. 60 tablet 0  . pravastatin (PRAVACHOL) 40 MG tablet Take 1 tablet (40 mg total) by mouth daily. 90 tablet 0  . traMADol (ULTRAM) 50 MG tablet Take 1 tablet (50 mg total) by mouth every 8 (eight) hours as needed. 90 tablet 2  . vitamin C (ASCORBIC ACID) 500 MG tablet Take 500 mg by mouth daily.      No current facility-administered medications on file prior to visit.        Review of Systems neg for any significant sore throat, dysphagia, itching, sneezing, nasal congestion or excess/ purulent secretions, fever, chills, sweats, unintended wt loss, pleuritic or exertional cp, hempoptysis, orthopnea pnd or change in chronic leg swelling. Also denies presyncope, palpitations, heartburn, abdominal pain, nausea, vomiting, diarrhea or change in bowel or urinary habits, dysuria,hematuria, rash, arthralgias, visual complaints, headache, numbness weakness or ataxia.     Objective:   Physical Exam  Vitals:   06/11/16 0931  BP: 128/66  Pulse: 80  Temp: 98.5 F (36.9 C)  TempSrc: Oral  SpO2: 96%  Weight: (!) 332 lb (150.6 kg)  Height: 5' 5"  (1.651 m)   Body mass index is 55.25 kg/m.   Gen. Pleasant, morbidly obese, in no distress, walks with a pink  walker ENT - no lesions, no post nasal drip, class 3 MP airway  Neck: No JVD, no thyromegaly, no carotid  bruits Lungs: no use of accessory muscles, no dullness to percussion, decreased without rales or rhonchi  Cardiovascular: Rhythm regular, heart sounds  normal, no murmurs or gallops, trace peripheral edema Musculoskeletal: No deformities, no cyanosis or clubbing , no tremors   Violanda Bobeck NP-C  [facility]  06/11/2016

## 2016-06-11 NOTE — Assessment & Plan Note (Signed)
Compensated without flare   Plan  Patient Instructions  Continue on CPAP At bedtime  -keep up good work  Work on weight loss.  Do not drive if sleepy.  Flu shot today  Activity as tolerated.  Ventolin As needed  For wheezing/shortness of breathing .  Follow up Dr. Elsworth Soho  In 1 year and As needed

## 2016-06-11 NOTE — Assessment & Plan Note (Signed)
Compensated on CPAP   Plan  Patient Instructions  Continue on CPAP At bedtime  -keep up good work  Work on weight loss.  Do not drive if sleepy.  Flu shot today  Activity as tolerated.  Ventolin As needed  For wheezing/shortness of breathing .  Follow up Dr. Elsworth Soho  In 1 year and As needed

## 2016-06-16 ENCOUNTER — Telehealth: Payer: Self-pay | Admitting: Hematology

## 2016-06-16 NOTE — Telephone Encounter (Signed)
SPOKE WITH PATIENT RE SEPT 2018 F/U. PATIENT TO SCHEDULE ANNUAL MAMMO/

## 2016-06-24 DIAGNOSIS — L659 Nonscarring hair loss, unspecified: Secondary | ICD-10-CM | POA: Diagnosis not present

## 2016-06-28 ENCOUNTER — Other Ambulatory Visit: Payer: Self-pay | Admitting: Internal Medicine

## 2016-06-29 MED ORDER — DULOXETINE HCL 30 MG PO CPEP: ORAL_CAPSULE | ORAL | 3 refills | Status: DC

## 2016-06-29 NOTE — Telephone Encounter (Signed)
Done hardcopy to Corinne  

## 2016-07-03 ENCOUNTER — Other Ambulatory Visit: Payer: Self-pay | Admitting: Internal Medicine

## 2016-07-05 ENCOUNTER — Other Ambulatory Visit: Payer: Self-pay | Admitting: Cardiology

## 2016-07-06 ENCOUNTER — Emergency Department (HOSPITAL_COMMUNITY)
Admission: EM | Admit: 2016-07-06 | Discharge: 2016-07-06 | Disposition: A | Discharge status: Home / Self Care | Payer: Medicare Other | Attending: Emergency Medicine | Admitting: Emergency Medicine

## 2016-07-06 ENCOUNTER — Encounter (HOSPITAL_COMMUNITY): Payer: Self-pay | Admitting: *Deleted

## 2016-07-06 ENCOUNTER — Emergency Department (HOSPITAL_COMMUNITY): Payer: Medicare Other

## 2016-07-06 ENCOUNTER — Ambulatory Visit (HOSPITAL_BASED_OUTPATIENT_CLINIC_OR_DEPARTMENT_OTHER)
Admission: RE | Admit: 2016-07-06 | Discharge: 2016-07-06 | Disposition: A | Discharge status: Home / Self Care | Payer: Medicare Other | Source: Ambulatory Visit | Attending: Emergency Medicine | Admitting: Emergency Medicine

## 2016-07-06 DIAGNOSIS — Z853 Personal history of malignant neoplasm of breast: Secondary | ICD-10-CM | POA: Insufficient documentation

## 2016-07-06 DIAGNOSIS — M7989 Other specified soft tissue disorders: Secondary | ICD-10-CM | POA: Insufficient documentation

## 2016-07-06 DIAGNOSIS — Y999 Unspecified external cause status: Secondary | ICD-10-CM | POA: Diagnosis not present

## 2016-07-06 DIAGNOSIS — I5031 Acute diastolic (congestive) heart failure: Secondary | ICD-10-CM | POA: Diagnosis not present

## 2016-07-06 DIAGNOSIS — X58XXXA Exposure to other specified factors, initial encounter: Secondary | ICD-10-CM | POA: Diagnosis not present

## 2016-07-06 DIAGNOSIS — Z79899 Other long term (current) drug therapy: Secondary | ICD-10-CM | POA: Diagnosis not present

## 2016-07-06 DIAGNOSIS — J449 Chronic obstructive pulmonary disease, unspecified: Secondary | ICD-10-CM | POA: Diagnosis not present

## 2016-07-06 DIAGNOSIS — M79609 Pain in unspecified limb: Secondary | ICD-10-CM | POA: Diagnosis not present

## 2016-07-06 DIAGNOSIS — Y939 Activity, unspecified: Secondary | ICD-10-CM | POA: Insufficient documentation

## 2016-07-06 DIAGNOSIS — Z7901 Long term (current) use of anticoagulants: Secondary | ICD-10-CM | POA: Diagnosis not present

## 2016-07-06 DIAGNOSIS — Y929 Unspecified place or not applicable: Secondary | ICD-10-CM | POA: Diagnosis not present

## 2016-07-06 DIAGNOSIS — E039 Hypothyroidism, unspecified: Secondary | ICD-10-CM | POA: Diagnosis not present

## 2016-07-06 DIAGNOSIS — Z950 Presence of cardiac pacemaker: Secondary | ICD-10-CM | POA: Insufficient documentation

## 2016-07-06 DIAGNOSIS — I2510 Atherosclerotic heart disease of native coronary artery without angina pectoris: Secondary | ICD-10-CM | POA: Insufficient documentation

## 2016-07-06 DIAGNOSIS — Z87891 Personal history of nicotine dependence: Secondary | ICD-10-CM | POA: Diagnosis not present

## 2016-07-06 DIAGNOSIS — I110 Hypertensive heart disease with heart failure: Secondary | ICD-10-CM | POA: Insufficient documentation

## 2016-07-06 MED ORDER — DOXYCYCLINE HYCLATE 100 MG PO CAPS: 100.0000 mg | ORAL_CAPSULE | Freq: Two times a day (BID) | ORAL | 0 refills | Status: DC

## 2016-07-06 NOTE — ED Provider Notes (Signed)
[place] DEPT Provider Note   CSN: [id number] Arrival date & time: 07/06/16  0219     History   Chief Complaint Chief Complaint  Patient presents with  . Leg Swelling  . Fall    HPI [name] [name] is a 70 y.o. female.  70 yo F with a chief complaint of right lower extremity swelling and pain. Patient initially fell about 2 weeks ago. She felt that she may be sprained her ankle. It has gotten progressively worse over the course of 2 weeks with ice and elevation. She denies fevers or chills denies nausea or vomiting. Denies other injury.   The history is provided by the patient.  Fall  This is a new problem. The current episode started more than 1 week ago. The problem occurs constantly. The problem has not changed since onset.Pertinent negatives include no chest pain, no headaches and no shortness of breath. Nothing aggravates the symptoms. The symptoms are relieved by walking. She has tried nothing for the symptoms. The treatment provided no relief.    Past Medical History:  Diagnosis Date  . Anemia   . Asthma 06/13/2011   hx of  . Atrial fibrillation ([place])   . Atrial flutter ([place])   . Breast cancer ([place]) dx'd 2005   No BP check or stick in Left arm  . CAD (coronary artery disease)    a. Nonobstructive CAD 2007 (EF 75%.  RCA  proximal 75% tubular, 25% prox LAD, 25% d1, 50% D2) at [facility]. b. NSTEMI 01/2010 in setting of respiratory failure, medical approach (not a good candidate for noninvasive eval)  . Cellulitis    hx of cellulitis in left leg  . Cervical radiculopathy 06/13/2011  . COPD (chronic obstructive pulmonary disease) ([place])    on O2  . Depression 06/13/2011  . Diverticulosis   . Esophageal dysmotility   . Gout 06/13/2011  . History of Clostridium difficile early dec 2015   no current diarrhea  . History of home oxygen therapy    2 liters per nasal cannula all the time  . Hx of dysfunctional uterine bleeding    last bleeding jan 2016, worked up for with d  and c x 2 no cause found  . Hyperlipidemia   . Hypertension   . Hypothyroidism   . Impaired glucose tolerance 11/01/2012  . Neuropathy ([place]) 06/13/2011  . Obesity hypoventilation syndrome ([place]) 06/13/2011  . OSA on CPAP   . PFO (patent foramen ovale)   . Risk for falls   . Syncope and collapse    Bradycardia with pauses. S/P pacemaker  . Tubular adenoma of colon     Patient Active Problem List   Diagnosis Date Noted  . History of left breast cancer 06/08/2016  . Panic attacks 04/27/2016  . Chest pain 04/11/2016  . OSA (obstructive sleep apnea) 02/12/2016  . Adhesive capsulitis of right shoulder 08/21/2015  . Right shoulder pain 07/16/2015  . Osteoarthritis, hand 07/16/2015  . Hair loss 07/16/2015  . Blood in mouth of unknown source 05/06/2015  . Dysphagia, pharyngoesophageal phase 05/06/2015  . Cellulitis of female breast 10/25/2014  . Rash and nonspecific skin eruption 10/25/2014  . Hx of colonic polyps   . Benign neoplasm of cecum   . Benign neoplasm of transverse colon   . CHF (congestive heart failure) ([place])   . History of Clostridium difficile 06/27/2014  . RUQ pain 05/31/2014  . Lump in the abdomen 05/31/2014  . Diarrhea 05/14/2014  . Abdominal pain, other specified  site 05/14/2014  . Pacemaker 12/03/2013  . Exfoliative dermatitis 11/27/2013  . Urinary urgency 11/13/2013  . Post-menopausal bleeding 11/13/2013  . Syncope and collapse 08/30/2013  . Atrial fibrillation ([place])   . Syncope 08/26/2013  . Postmenopausal vaginal bleeding 05/02/2013  . Tachy-brady syndrome ([place]) 03/15/2013  . Acute diastolic CHF (congestive heart failure) ([place]) 03/15/2013  . PFO (patent foramen ovale) 03/15/2013  . Hypokalemia 03/15/2013  . Atrial flutter ([place]) 03/01/2013  . Tachycardia 03/01/2013  . Anticoagulant long-term use 03/01/2013  . Impaired glucose tolerance 11/01/2012  . Abnormal liver function tests 11/01/2012  . Right lumbar radiculopathy 04/28/2012  . Cough 04/15/2012    . Abnormal TSH 04/15/2012  . Acute appendicitis 03/29/2012  . Sick sinus syndrome ([place]) 03/08/2012  . Morbid obesity with BMI of 50.0-59.9, adult ([place]) 03/08/2012  . Breast cancer ([place]) 06/13/2011  . Asthma 06/13/2011  . Depression 06/13/2011  . Colon polyps 06/13/2011  . Neuropathy ([place]) 06/13/2011  . Cervical radiculopathy 06/13/2011  . Gout 06/13/2011  . Obesity hypoventilation syndrome ([place]) 06/13/2011  . Preventative health care 06/04/2011  . OXYGEN-USE OF SUPPLEMENTAL 04/07/2010  . ACUT MI SUBENDOCARDIAL INFARCT SUBSQT EPIS CARE 03/13/2010  . COPD (chronic obstructive pulmonary disease) ([place]) 03/10/2010  . Sleep apnea 03/10/2010  . HYPOTHYROIDISM 03/17/2009  . Hyperlipidemia 03/17/2009  . Essential hypertension 03/17/2009  . Coronary atherosclerosis 03/17/2009    Past Surgical History:  Procedure Laterality Date  . BREAST LUMPECTOMY Left   . CARDIOVERSION N/A 03/15/2013   Procedure: CARDIOVERSION;  Surgeon: Thayer Headings, MD;  Location: [facility] ENDOSCOPY;  Service: Cardiovascular;  Laterality: N/A;  . CERVICAL BIOPSY  June 2013   at [facility] for Heavy  Bleeding  . [place] SURGERY  2004 or 2005   have cadaver bones,, screws, and plates c 5 to c 6  . COLONOSCOPY    . COLONOSCOPY WITH PROPOFOL N/A 10/22/2014   Procedure: COLONOSCOPY WITH PROPOFOL;  Surgeon: Jerene Bears, MD;  Location: [facility] ENDOSCOPY;  Service: Gastroenterology;  Laterality: N/A;  . DILATION AND CURETTAGE OF UTERUS    . LAPAROSCOPIC APPENDECTOMY  03/29/2012   Procedure: APPENDECTOMY LAPAROSCOPIC;  Surgeon: Adin Hector, MD;  Location: [facility] ORS;  Service: General;  Laterality: N/A;  . LOOP RECORDER EXPLANT  08/31/2013   Procedure: LOOP RECORDER EXPLANT;  Surgeon: Coralyn Mark, MD;  Location: [facility] CATH LAB;  Service: Cardiovascular;;  . LOOP RECORDER IMPLANT  08-28-2013; 08-31-2013   MDT LinQ implanted by Dr Rayann Heman for syncope; explanted 08-31-2013 after sinus pauses identified  . LOOP RECORDER IMPLANT N/A  08/28/2013   Procedure: LOOP RECORDER IMPLANT;  Surgeon: Coralyn Mark, MD;  Location: [place] CATH LAB;  Service: Cardiovascular;  Laterality: N/A;  . OVARIAN CYST REMOVAL    . PACEMAKER INSERTION  08-31-2013   Biotronik Evera dual chamber pacemaker placed for neurocardiogenic syncope and sinus pauses by Dr Rayann Heman  . PERMANENT PACEMAKER INSERTION N/A 08/31/2013   Procedure: PERMANENT PACEMAKER INSERTION;  Surgeon: Coralyn Mark, MD;  Location: [place] CATH LAB;  Service: Cardiovascular;  Laterality: N/A;  . TEE WITHOUT CARDIOVERSION N/A 03/15/2013   Procedure: TRANSESOPHAGEAL ECHOCARDIOGRAM (TEE);  Surgeon: Thayer Headings, MD;  Location: [facility];  Service: Cardiovascular;  Laterality: N/A;    OB History    No data available       Home Medications    Prior to Admission medications   Medication Sig Start Date End Date Taking? Authorizing Provider  albuterol (PROVENTIL HFA;VENTOLIN HFA) 108 (90 Base) MCG/ACT inhaler Inhale  2 puffs into the lungs every 6 (six) hours as needed for wheezing or shortness of breath. 06/11/16   Melvenia Needles, NP  allopurinol (ZYLOPRIM) 300 MG tablet TAKE 1 TABLET DAILY 02/20/16   Biagio Borg, MD  amitriptyline (ELAVIL) 100 MG tablet take 1 tablet by mouth at bedtime if needed for sleep 02/19/16   Biagio Borg, MD  Cholecalciferol (VITAMIN D) 2000 UNITS tablet Take 2,000 Units by mouth at bedtime.    Historical Provider, MD  clonazePAM (KLONOPIN) 0.5 MG tablet Take 1 tablet (0.5 mg total) by mouth 2 (two) times daily as needed for anxiety. 04/27/16   Biagio Borg, MD  diclofenac sodium (VOLTAREN) 1 % GEL Apply 4 g topically 4 (four) times daily as needed. 07/16/15   Biagio Borg, MD  doxycycline (VIBRAMYCIN) 100 MG capsule Take 1 capsule (100 mg total) by mouth 2 (two) times daily. 07/06/16   Deno Etienne, DO  DULoxetine (CYMBALTA) 30 MG capsule TAKE 1 CAPSULE TWICE A DAY 06/29/16   Biagio Borg, MD  ELIQUIS 5 MG TABS tablet TAKE 1 TABLET TWICE A DAY 08/25/15   Biagio Borg,  MD  ferrous sulfate 325 (65 FE) MG tablet Take 325 mg by mouth daily with breakfast.    Historical Provider, MD  furosemide (LASIX) 40 MG tablet TAKE 1 TABLET EVERY MORNING AND 1 TABLET EVERY OTHER NIGHT 06/01/16   Biagio Borg, MD  levothyroxine (SYNTHROID, LEVOTHROID) 200 MCG tablet TAKE 1 TABLET DAILY 08/25/15   Biagio Borg, MD  metoprolol tartrate (LOPRESSOR) 25 MG tablet Take 1 tablet (25 mg total) by mouth 2 (two) times daily. Reported on 02/12/2016 03/10/16   Biagio Borg, MD  Omega-3 Fatty Acids (FISH OIL PO) Take 1 capsule by mouth daily.     Historical Provider, MD  omeprazole (PRILOSEC) 40 MG capsule Take 1 capsule (40 mg total) by mouth daily. 07/16/15   Jerene Bears, MD  potassium chloride SA (K-DUR,KLOR-CON) 20 MEQ tablet Take 1 tablet (20 mEq total) by mouth 2 (two) times daily. Please schedule appointment for refills. 05/07/16   Lelon Perla, MD  pravastatin (PRAVACHOL) 40 MG tablet TAKE 1 TABLET DAILY 07/05/16   Lelon Perla, MD  traMADol (ULTRAM) 50 MG tablet Take 1 tablet (50 mg total) by mouth every 8 (eight) hours as needed. 03/10/16   Biagio Borg, MD  vitamin C (ASCORBIC ACID) 500 MG tablet Take 500 mg by mouth daily.     Historical Provider, MD    Family History Family History  Problem Relation Age of Onset  . Uterine cancer Mother   . Heart disease Mother   . Breast cancer Sister 4  . Ovarian cancer Sister   . Breast cancer Cousin 64  . Diabetes Brother   . Breast cancer Maternal Aunt   . Ovarian cancer Sister     Social History Social History  Substance Use Topics  . Smoking status: Former Smoker    Packs/day: 1.00    Years: 20.00    Types: Cigarettes    Quit date: 09/20/1998  . Smokeless tobacco: Never Used     Comment: 1ppd x 20 years  . Alcohol use 0.6 oz/week    1 Glasses of wine per week     Comment: rare     Allergies   Cephalexin; Codeine; Dilaudid [hydromorphone]; Lisinopril; Penicillins; and Cleocin [clindamycin hcl]   Review of  Systems Review of Systems  Constitutional: Negative for chills and  fever.  HENT: Negative for congestion and rhinorrhea.   Eyes: Negative for redness and visual disturbance.  Respiratory: Negative for shortness of breath and wheezing.   Cardiovascular: Positive for leg swelling. Negative for chest pain and palpitations.  Gastrointestinal: Negative for nausea and vomiting.  Genitourinary: Negative for dysuria and urgency.  Musculoskeletal: Negative for arthralgias and myalgias.  Skin: Positive for color change. Negative for pallor and wound.  Neurological: Negative for dizziness and headaches.     Physical Exam Updated Vital Signs BP 104/61   Pulse 77   Temp 98.1 F (36.7 C) (Oral)   Ht 5' 5"  (1.651 m)   Wt (!) 319 lb (144.7 kg)   SpO2 96%   BMI 53.08 kg/m   Physical Exam  Constitutional: She is oriented to person, place, and time. She appears well-developed and well-nourished. No distress.  HENT:  Head: Normocephalic and atraumatic.  Eyes: EOM are normal. Pupils are equal, round, and reactive to light.  Neck: Normal range of motion. Neck supple.  Cardiovascular: Normal rate and regular rhythm.  Exam reveals no gallop and no friction rub.   No murmur heard. Pulmonary/Chest: Effort normal. She has no wheezes. She has no rales.  Abdominal: Soft. She exhibits no distension. There is no tenderness.  Musculoskeletal: She exhibits edema (significant swelling to the right lower extremity below the knee. There is some associated erythema and warmth. Pulse motor and sensation is intact distally.). She exhibits no tenderness.  Neurological: She is alert and oriented to person, place, and time.  Skin: Skin is warm and dry. She is not diaphoretic.  Psychiatric: She has a normal mood and affect. Her behavior is normal.  Nursing note and vitals reviewed.    ED Treatments / Results  Labs (all labs ordered are listed, but only abnormal results are displayed) Labs Reviewed - No data to  display  EKG  EKG Interpretation None       Radiology No results found.  Procedures Procedures (including critical care time)  Medications Ordered in ED Medications - No data to display   Initial Impression / Assessment and Plan / ED Course  I have reviewed the triage vital signs and the nursing notes.  Pertinent labs & imaging results that were available during my care of the patient were reviewed by me and considered in my medical decision making (see chart for details).  Clinical Course    70 yo F With a chief complaint of right lower extremity swelling and pain. Patient has some warmth associated with the area. I suspect there may be an element of cellulitis. I also think DVT is a possibility, though patient is on eliquis. Will obtain a vascular study. Patient has hx of multiple cellulitis to RLE, has multiple allergies, will try doxy.   6:51 AM:  I have discussed the diagnosis/risks/treatment options with the patient and family and believe the pt to be eligible for discharge home to follow-up with PCP. We also discussed returning to the ED immediately if new or worsening sx occur. We discussed the sx which are most concerning (e.g., sudden worsening pain, fever, inability to tolerate by mouth) that necessitate immediate return. Medications administered to the patient during their visit and any new prescriptions provided to the patient are listed below.  Medications given during this visit Medications - No data to display   The patient appears reasonably screen and/or stabilized for discharge and I doubt any other medical condition or other [facility] requiring further screening, evaluation, or treatment in  the ED at this time prior to discharge.    Final Clinical Impressions(s) / ED Diagnoses   Final diagnoses:  Leg swelling    New Prescriptions New Prescriptions   DOXYCYCLINE (VIBRAMYCIN) 100 MG CAPSULE    Take 1 capsule (100 mg total) by mouth 2 (two) times daily.       Deno Etienne, DO 07/06/16 [phone]

## 2016-07-06 NOTE — ED Triage Notes (Signed)
Patient stated she fell a couple of weeks ago hurting her right leg and just thought it may have been a sprain.  Her right leg is swollen and now it is hurting all the way up to her thigh

## 2016-07-06 NOTE — ED Notes (Signed)
Patient transported to X-ray 

## 2016-07-06 NOTE — Progress Notes (Signed)
**  Preliminary report by tech**  Right lower extremity venous duplex completed. Technically difficult study due to patient body habitus, edema, depth of vessels, and acoustic shadowing. There is no evidence of deep or superficial vein thrombosis involving the right lower extremity. All visualized vessels appear patent and compressible. There is no evidence of a Baker's cyst on the right.  07/06/16 8:40 AM [name] [name] RVT

## 2016-07-21 ENCOUNTER — Ambulatory Visit: Payer: Medicare Other | Admitting: Internal Medicine

## 2016-07-25 ENCOUNTER — Encounter: Payer: Self-pay | Admitting: Internal Medicine

## 2016-07-28 ENCOUNTER — Encounter: Payer: Self-pay | Admitting: Internal Medicine

## 2016-07-28 ENCOUNTER — Ambulatory Visit (INDEPENDENT_AMBULATORY_CARE_PROVIDER_SITE_OTHER): Payer: Medicare Other | Admitting: Internal Medicine

## 2016-07-28 VITALS — BP 130/70 | HR 97 | Temp 97.8°F | Resp 20 | Wt 331.0 lb

## 2016-07-28 DIAGNOSIS — J449 Chronic obstructive pulmonary disease, unspecified: Secondary | ICD-10-CM | POA: Diagnosis not present

## 2016-07-28 DIAGNOSIS — I10 Essential (primary) hypertension: Secondary | ICD-10-CM | POA: Diagnosis not present

## 2016-07-28 DIAGNOSIS — R7302 Impaired glucose tolerance (oral): Secondary | ICD-10-CM

## 2016-07-28 DIAGNOSIS — L03115 Cellulitis of right lower limb: Secondary | ICD-10-CM

## 2016-07-28 MED ORDER — CLONAZEPAM 0.5 MG PO TABS: 0.5000 mg | ORAL_TABLET | Freq: Two times a day (BID) | ORAL | 2 refills | Status: DC | PRN

## 2016-07-28 MED ORDER — DOXYCYCLINE HYCLATE 100 MG PO CAPS: 100.0000 mg | ORAL_CAPSULE | Freq: Two times a day (BID) | ORAL | 0 refills | Status: DC

## 2016-07-28 MED ORDER — DOXYCYCLINE HYCLATE 100 MG PO CAPS
100.0000 mg | ORAL_CAPSULE | Freq: Two times a day (BID) | ORAL | 0 refills | Status: DC
Start: 2016-07-28 — End: 2016-09-02

## 2016-07-28 NOTE — Progress Notes (Signed)
Pre visit review using our clinic review tool, if applicable. No additional management support is needed unless otherwise documented below in the visit note. 

## 2016-07-28 NOTE — Patient Instructions (Addendum)
Please take all new medication as prescribed - the antibiotic  Please continue all other medications as before, and refills have been done if requested - the clonazepam  Please have the pharmacy call with any other refills you may need.  Please continue your efforts at being more active, low cholesterol diet, and weight control.  Please keep your appointments with your specialists as you may have planned  Please return in 3 months, or sooner if needed

## 2016-07-28 NOTE — Progress Notes (Signed)
Subjective:    Patient ID: [name] [name], female    DOB: [birth year], 70 y.o.   MRN: [id number]  HPI  Here to f/u, had a fall sept 22 but no injury except for abrasion to rigtht leg.  Then started to swell and become red, tender, seen at ED, completed antibx, but still had some swelling when dependent, and now starting up again red/tender/swelling without ulcer, drainage, red streaks or drainage. Pt denies chest pain, increased sob or doe, wheezing, orthopnea, PND, increased LE swelling, palpitations, dizziness or syncope.  Pt denies new neurological symptoms such as new headache, or facial or extremity weakness or numbness   Pt denies polydipsia, polyuria,  Denies worsening depressive symptoms, suicidal ideation, or panic; has ongoing anxiety, not increased recently when she takes the cymbalta and the klonopin regularly.    Past Medical History:  Diagnosis Date  . Anemia   . Asthma 06/13/2011   hx of  . Atrial fibrillation ([place])   . Atrial flutter ([place])   . Breast cancer ([place]) dx'd 2005   No BP check or stick in Left arm  . CAD (coronary artery disease)    a. Nonobstructive CAD 2007 (EF 75%.  RCA  proximal 75% tubular, 25% prox LAD, 25% d1, 50% D2) at [facility]. b. NSTEMI 01/2010 in setting of respiratory failure, medical approach (not a good candidate for noninvasive eval)  . Cellulitis    hx of cellulitis in left leg  . Cervical radiculopathy 06/13/2011  . COPD (chronic obstructive pulmonary disease) ([place])    on O2  . Depression 06/13/2011  . Diverticulosis   . Esophageal dysmotility   . Gout 06/13/2011  . History of Clostridium difficile early dec 2015   no current diarrhea  . History of home oxygen therapy    2 liters per nasal cannula all the time  . Hx of dysfunctional uterine bleeding    last bleeding jan 2016, worked up for with d and c x 2 no cause found  . Hyperlipidemia   . Hypertension   . Hypothyroidism   . Impaired glucose tolerance 11/01/2012  . Neuropathy ([place]) 06/13/2011    . Obesity hypoventilation syndrome ([place]) 06/13/2011  . OSA on CPAP   . PFO (patent foramen ovale)   . Risk for falls   . Syncope and collapse    Bradycardia with pauses. S/P pacemaker  . Tubular adenoma of colon    Past Surgical History:  Procedure Laterality Date  . BREAST LUMPECTOMY Left   . CARDIOVERSION N/A 03/15/2013   Procedure: CARDIOVERSION;  Surgeon: Thayer Headings, MD;  Location: [facility] ENDOSCOPY;  Service: Cardiovascular;  Laterality: N/A;  . CERVICAL BIOPSY  June 2013   at [facility] for Heavy  Bleeding  . [place] SURGERY  2004 or 2005   have cadaver bones,, screws, and plates c 5 to c 6  . COLONOSCOPY    . COLONOSCOPY WITH PROPOFOL N/A 10/22/2014   Procedure: COLONOSCOPY WITH PROPOFOL;  Surgeon: Jerene Bears, MD;  Location: [facility] ENDOSCOPY;  Service: Gastroenterology;  Laterality: N/A;  . DILATION AND CURETTAGE OF UTERUS    . LAPAROSCOPIC APPENDECTOMY  03/29/2012   Procedure: APPENDECTOMY LAPAROSCOPIC;  Surgeon: Adin Hector, MD;  Location: [facility] ORS;  Service: General;  Laterality: N/A;  . LOOP RECORDER EXPLANT  08/31/2013   Procedure: LOOP RECORDER EXPLANT;  Surgeon: Coralyn Mark, MD;  Location: [facility] CATH LAB;  Service: Cardiovascular;;  . LOOP RECORDER IMPLANT  08-28-2013; 08-31-2013   MDT Cruz Condon  implanted by Dr Rayann Heman for syncope; explanted 08-31-2013 after sinus pauses identified  . LOOP RECORDER IMPLANT N/A 08/28/2013   Procedure: LOOP RECORDER IMPLANT;  Surgeon: Coralyn Mark, MD;  Location: [place] CATH LAB;  Service: Cardiovascular;  Laterality: N/A;  . OVARIAN CYST REMOVAL    . PACEMAKER INSERTION  08-31-2013   Biotronik Evera dual chamber pacemaker placed for neurocardiogenic syncope and sinus pauses by Dr Rayann Heman  . PERMANENT PACEMAKER INSERTION N/A 08/31/2013   Procedure: PERMANENT PACEMAKER INSERTION;  Surgeon: Coralyn Mark, MD;  Location: [facility] CATH LAB;  Service: Cardiovascular;  Laterality: N/A;  . TEE WITHOUT CARDIOVERSION N/A 03/15/2013   Procedure: TRANSESOPHAGEAL  ECHOCARDIOGRAM (TEE);  Surgeon: Thayer Headings, MD;  Location: [facility] ENDOSCOPY;  Service: Cardiovascular;  Laterality: N/A;    reports that she quit smoking about 17 years ago. Her smoking use included Cigarettes. She has a 20.00 pack-year smoking history. She has never used smokeless tobacco. She reports that she drinks about 0.6 oz of alcohol per week . She reports that she does not use drugs. family history includes Breast cancer in her maternal aunt; Breast cancer (age of onset: 39) in her sister; Breast cancer (age of onset: 71) in her cousin; Diabetes in her brother; Heart disease in her mother; Ovarian cancer in her sister and sister; Uterine cancer in her mother. Allergies  Allergen Reactions  . Cephalexin Other (See Comments)    Exfoliative dermatitis reaction  . Codeine     REACTION: unknown - pt. states unaware of having reaction to codeine  . Dilaudid [Hydromorphone] Other (See Comments)    Other Reaction: Oversedation  . Lisinopril     REACTION: cough  . Penicillins     rash  . Cleocin [Clindamycin Hcl] Rash    rash   Current Outpatient Prescriptions on File Prior to Visit  Medication Sig Dispense Refill  . albuterol (PROVENTIL HFA;VENTOLIN HFA) 108 (90 Base) MCG/ACT inhaler Inhale 2 puffs into the lungs every 6 (six) hours as needed for wheezing or shortness of breath. 1 Inhaler 3  . allopurinol (ZYLOPRIM) 300 MG tablet TAKE 1 TABLET DAILY 90 tablet 1  . amitriptyline (ELAVIL) 100 MG tablet take 1 tablet by mouth at bedtime if needed for sleep 90 tablet 1  . Cholecalciferol (VITAMIN D) 2000 UNITS tablet Take 2,000 Units by mouth at bedtime.    . clonazePAM (KLONOPIN) 0.5 MG tablet Take 1 tablet (0.5 mg total) by mouth 2 (two) times daily as needed for anxiety. 60 tablet 1  . diclofenac sodium (VOLTAREN) 1 % GEL Apply 4 g topically 4 (four) times daily as needed. 400 g 5  . doxycycline (VIBRAMYCIN) 100 MG capsule Take 1 capsule (100 mg total) by mouth 2 (two) times daily. 20  capsule 0  . DULoxetine (CYMBALTA) 30 MG capsule TAKE 1 CAPSULE TWICE A DAY 180 capsule 3  . ELIQUIS 5 MG TABS tablet TAKE 1 TABLET TWICE A DAY 180 tablet 3  . ferrous sulfate 325 (65 FE) MG tablet Take 325 mg by mouth daily with breakfast.    . furosemide (LASIX) 40 MG tablet TAKE 1 TABLET EVERY MORNING AND 1 TABLET EVERY OTHER NIGHT 180 tablet 0  . levothyroxine (SYNTHROID, LEVOTHROID) 200 MCG tablet TAKE 1 TABLET DAILY 90 tablet 3  . metoprolol tartrate (LOPRESSOR) 25 MG tablet Take 1 tablet (25 mg total) by mouth 2 (two) times daily. Reported on 02/12/2016 180 tablet 1  . Omega-3 Fatty Acids (FISH OIL PO) Take 1 capsule by mouth  daily.     . omeprazole (PRILOSEC) 40 MG capsule Take 1 capsule (40 mg total) by mouth daily. 90 capsule 3  . potassium chloride SA (K-DUR,KLOR-CON) 20 MEQ tablet Take 1 tablet (20 mEq total) by mouth 2 (two) times daily. Please schedule appointment for refills. 60 tablet 0  . pravastatin (PRAVACHOL) 40 MG tablet TAKE 1 TABLET DAILY 90 tablet 0  . traMADol (ULTRAM) 50 MG tablet Take 1 tablet (50 mg total) by mouth every 8 (eight) hours as needed. 90 tablet 2  . vitamin C (ASCORBIC ACID) 500 MG tablet Take 500 mg by mouth daily.      No current facility-administered medications on file prior to visit.    Review of Systems  Constitutional: Negative for unusual diaphoresis or night sweats HENT: Negative for ear swelling or discharge Eyes: Negative for worsening visual haziness  Respiratory: Negative for choking and stridor.   Gastrointestinal: Negative for distension or worsening eructation Genitourinary: Negative for retention or change in urine volume.  Musculoskeletal: Negative for other MSK pain or swelling Skin: Negative for color change and worsening wound Neurological: Negative for tremors and numbness other than noted  Psychiatric/Behavioral: Negative for decreased concentration or agitation other than above       Objective:   Physical Exam BP 130/70    Pulse 97   Temp 97.8 F (36.6 C) (Oral)   Resp 20   Wt (!) 331 lb (150.1 kg)   SpO2 93%   BMI 55.08 kg/m  VS noted, morbid obese Constitutional: Pt appears in no apparent distress HENT: Head: NCAT.  Right Ear: External ear normal.  Left Ear: External ear normal.  Eyes: . Pupils are equal, round, and reactive to light. Conjunctivae and EOM are normal Neck: Normal range of motion. Neck supple.  Cardiovascular: Normal rate and regular rhythm.   Pulmonary/Chest: Effort normal and breath sounds decreased without rales or wheezing.  Neurological: Pt is alert. Not confused , motor grossly intact Skin: Skin is warm. No rash, 1-2+ right > left LE edema with 4 x 5 cm area red/tender/swelling without ulcer or red streaks Psychiatric: Pt behavior is normal. No agitation.     Assessment & Plan:

## 2016-07-30 ENCOUNTER — Telehealth: Payer: Self-pay

## 2016-07-30 NOTE — Telephone Encounter (Signed)
PA initiated and APPROVED 06/30/2016;Coverage End Date:07/30/2017 via CoverMyMeds key [id number]

## 2016-08-01 NOTE — Assessment & Plan Note (Signed)
Recurrent, Mild to mod, for antibx course,  to f/u any worsening symptoms or concerns

## 2016-08-01 NOTE — Assessment & Plan Note (Signed)
stable overall by history and exam, recent data reviewed with pt, and pt to continue medical treatment as before,  to f/u any worsening symptoms or concerns BP Readings from Last 3 Encounters:  07/28/16 130/70  07/06/16 104/61  06/11/16 128/66

## 2016-08-01 NOTE — Assessment & Plan Note (Signed)
stable overall by history and exam, recent data reviewed with pt, and pt to continue medical treatment as before,  to f/u any worsening symptoms or concerns Lab Results  Component Value Date   HGBA1C 5.5 05/06/2015

## 2016-08-01 NOTE — Assessment & Plan Note (Signed)
stable overall by history and exam, recent data reviewed with pt, and pt to continue medical treatment as before,  to f/u any worsening symptoms or concerns @LASTSAO2(3)@  

## 2016-08-03 ENCOUNTER — Other Ambulatory Visit: Payer: Self-pay | Admitting: Cardiology

## 2016-08-04 MED ORDER — POTASSIUM CHLORIDE CRYS ER 20 MEQ PO TBCR: 20.0000 meq | EXTENDED_RELEASE_TABLET | Freq: Two times a day (BID) | ORAL | 4 refills | Status: DC

## 2016-08-20 ENCOUNTER — Other Ambulatory Visit: Payer: Self-pay | Admitting: Internal Medicine

## 2016-08-27 ENCOUNTER — Other Ambulatory Visit: Payer: Self-pay

## 2016-08-30 ENCOUNTER — Encounter: Payer: Self-pay | Admitting: Internal Medicine

## 2016-08-30 ENCOUNTER — Encounter: Payer: Self-pay | Admitting: Adult Health

## 2016-08-30 ENCOUNTER — Other Ambulatory Visit: Payer: Self-pay | Admitting: Internal Medicine

## 2016-08-31 ENCOUNTER — Other Ambulatory Visit: Payer: Self-pay

## 2016-08-31 NOTE — Telephone Encounter (Signed)
Pt c/o increase sob, prod cough with clear mucus & wheezing X 2w. Pt request refill on  ventolin as she has had to use this more then usual (3-4x daily) and has run out.  Pt wanted to know if there were any bronco dilators that would help. Pt just finished two rounds of antibiotics for cellulitis in her leg. Last dose of abx taking last week.  TP please advise. Thanks.

## 2016-09-02 ENCOUNTER — Emergency Department (HOSPITAL_COMMUNITY)
Admission: EM | Admit: 2016-09-02 | Discharge: 2016-09-03 | Disposition: A | Discharge status: Home / Self Care | Payer: Medicare Other | Attending: Emergency Medicine | Admitting: Emergency Medicine

## 2016-09-02 ENCOUNTER — Encounter (HOSPITAL_COMMUNITY): Payer: Self-pay

## 2016-09-02 ENCOUNTER — Emergency Department (HOSPITAL_COMMUNITY): Payer: Medicare Other

## 2016-09-02 DIAGNOSIS — I110 Hypertensive heart disease with heart failure: Secondary | ICD-10-CM | POA: Diagnosis not present

## 2016-09-02 DIAGNOSIS — L03818 Cellulitis of other sites: Secondary | ICD-10-CM

## 2016-09-02 DIAGNOSIS — Z87891 Personal history of nicotine dependence: Secondary | ICD-10-CM | POA: Insufficient documentation

## 2016-09-02 DIAGNOSIS — Z85038 Personal history of other malignant neoplasm of large intestine: Secondary | ICD-10-CM | POA: Insufficient documentation

## 2016-09-02 DIAGNOSIS — L089 Local infection of the skin and subcutaneous tissue, unspecified: Secondary | ICD-10-CM | POA: Diagnosis present

## 2016-09-02 DIAGNOSIS — Z950 Presence of cardiac pacemaker: Secondary | ICD-10-CM | POA: Insufficient documentation

## 2016-09-02 DIAGNOSIS — Z853 Personal history of malignant neoplasm of breast: Secondary | ICD-10-CM | POA: Diagnosis not present

## 2016-09-02 DIAGNOSIS — Z7901 Long term (current) use of anticoagulants: Secondary | ICD-10-CM | POA: Insufficient documentation

## 2016-09-02 DIAGNOSIS — L304 Erythema intertrigo: Secondary | ICD-10-CM | POA: Insufficient documentation

## 2016-09-02 DIAGNOSIS — I5031 Acute diastolic (congestive) heart failure: Secondary | ICD-10-CM | POA: Diagnosis not present

## 2016-09-02 DIAGNOSIS — I2510 Atherosclerotic heart disease of native coronary artery without angina pectoris: Secondary | ICD-10-CM | POA: Insufficient documentation

## 2016-09-02 DIAGNOSIS — L03111 Cellulitis of right axilla: Secondary | ICD-10-CM | POA: Insufficient documentation

## 2016-09-02 DIAGNOSIS — J449 Chronic obstructive pulmonary disease, unspecified: Secondary | ICD-10-CM | POA: Diagnosis not present

## 2016-09-02 DIAGNOSIS — E039 Hypothyroidism, unspecified: Secondary | ICD-10-CM | POA: Diagnosis not present

## 2016-09-02 DIAGNOSIS — I517 Cardiomegaly: Secondary | ICD-10-CM | POA: Diagnosis not present

## 2016-09-02 DIAGNOSIS — A419 Sepsis, unspecified organism: Secondary | ICD-10-CM | POA: Diagnosis not present

## 2016-09-02 LAB — COMPREHENSIVE METABOLIC PANEL
ALBUMIN: 3.6 g/dL (ref 3.5–5.0)
ALT: 18 U/L (ref 14–54)
ALT: 16 U/L (ref 14–54)
AST: 28 U/L (ref 15–41)
AST: 26 U/L (ref 15–41)
Albumin: 3.4 g/dL — ABNORMAL LOW (ref 3.5–5.0)
Alkaline Phosphatase: 87 U/L (ref 38–126)
Alkaline Phosphatase: 84 U/L (ref 38–126)
Anion gap: 9 (ref 5–15)
Anion gap: 8 (ref 5–15)
BILIRUBIN TOTAL: 0.7 mg/dL (ref 0.3–1.2)
BILIRUBIN TOTAL: 0.9 mg/dL (ref 0.3–1.2)
BUN: 8 mg/dL (ref 6–20)
BUN: 8 mg/dL (ref 6–20)
CHLORIDE: 100 mmol/L — AB (ref 101–111)
CHLORIDE: 100 mmol/L — AB (ref 101–111)
CO2: 34 mmol/L — AB (ref 22–32)
CO2: 31 mmol/L (ref 22–32)
CREATININE: 0.95 mg/dL (ref 0.44–1.00)
Calcium: 9.5 mg/dL (ref 8.9–10.3)
Calcium: 9.2 mg/dL (ref 8.9–10.3)
Creatinine, Ser: 1.01 mg/dL — ABNORMAL HIGH (ref 0.44–1.00)
GFR calc Af Amer: >60 mL/min (ref >60)
GFR calc non Af Amer: 55 mL/min — ABNORMAL LOW (ref >60)
GFR, EST NON AFRICAN AMERICAN: 59 mL/min — AB (ref >60)
Glucose, Bld: 93 mg/dL (ref 65–99)
Glucose, Bld: 89 mg/dL (ref 65–99)
POTASSIUM: 4.2 mmol/L (ref 3.5–5.1)
POTASSIUM: 3.6 mmol/L (ref 3.5–5.1)
Sodium: 143 mmol/L (ref 135–145)
Sodium: 139 mmol/L (ref 135–145)
TOTAL PROTEIN: 6.6 g/dL (ref 6.5–8.1)
Total Protein: 7 g/dL (ref 6.5–8.1)

## 2016-09-02 LAB — CBC WITH DIFFERENTIAL/PLATELET
BASOS PCT: 0 %
Basophils Absolute: 0 10*3/uL (ref 0.0–0.1)
Basophils Absolute: 0 10*3/uL (ref 0.0–0.1)
Basophils Relative: 0 %
EOS ABS: 0.3 10*3/uL (ref 0.0–0.7)
EOS PCT: 4 %
EOS PCT: 5 %
Eosinophils Absolute: 0.3 10*3/uL (ref 0.0–0.7)
HCT: 42.6 % (ref 36.0–46.0)
HCT: 40.7 % (ref 36.0–46.0)
HEMOGLOBIN: 13 g/dL (ref 12.0–15.0)
Hemoglobin: 13.5 g/dL (ref 12.0–15.0)
LYMPHS ABS: 1.5 10*3/uL (ref 0.7–4.0)
LYMPHS PCT: 24 %
Lymphocytes Relative: 23 %
Lymphs Abs: 1.5 10*3/uL (ref 0.7–4.0)
MCH: 29.5 pg (ref 26.0–34.0)
MCH: 29.3 pg (ref 26.0–34.0)
MCHC: 31.7 g/dL (ref 30.0–36.0)
MCHC: 31.9 g/dL (ref 30.0–36.0)
MCV: 93 fL (ref 78.0–100.0)
MCV: 91.9 fL (ref 78.0–100.0)
MONO ABS: 0.5 10*3/uL (ref 0.1–1.0)
MONOS PCT: 8 %
MONOS PCT: 9 %
Monocytes Absolute: 0.6 10*3/uL (ref 0.1–1.0)
NEUTROS PCT: 63 %
Neutro Abs: 3.9 10*3/uL (ref 1.7–7.7)
Neutro Abs: 4.2 10*3/uL (ref 1.7–7.7)
Neutrophils Relative %: 64 %
PLATELETS: 177 10*3/uL (ref 150–400)
PLATELETS: 171 10*3/uL (ref 150–400)
RBC: 4.58 MIL/uL (ref 3.87–5.11)
RBC: 4.43 MIL/uL (ref 3.87–5.11)
RDW: 13.8 % (ref 11.5–15.5)
RDW: 13.7 % (ref 11.5–15.5)
WBC: 6.1 10*3/uL (ref 4.0–10.5)
WBC: 6.7 10*3/uL (ref 4.0–10.5)

## 2016-09-02 LAB — I-STAT BETA HCG BLOOD, ED (MC, WL, AP ONLY)

## 2016-09-02 LAB — I-STAT CG4 LACTIC ACID, ED
LACTIC ACID, VENOUS: 2.71 mmol/L — AB (ref 0.5–1.9)
Lactic Acid, Venous: 1.63 mmol/L (ref 0.5–1.9)

## 2016-09-02 LAB — PROTIME-INR
INR: 1.08
PROTHROMBIN TIME: 14.1 s (ref 11.4–15.2)

## 2016-09-02 MED ORDER — FLUCONAZOLE 100 MG PO TABS
150.0000 mg | ORAL_TABLET | Freq: Once | ORAL | Status: AC
  Administered 2016-09-02: 150 mg via ORAL
  Filled 2016-09-02: qty 2

## 2016-09-02 MED ORDER — SODIUM CHLORIDE 0.9 % IV BOLUS (SEPSIS)
500.0000 mL | Freq: Once | INTRAVENOUS | Status: AC
  Administered 2016-09-02: 500 mL via INTRAVENOUS

## 2016-09-02 MED ORDER — KETOCONAZOLE 2 % EX CREA: 1.0000 "application " | TOPICAL_CREAM | Freq: Every day | CUTANEOUS | 0 refills | Status: DC

## 2016-09-02 MED ORDER — FLUCONAZOLE 150 MG PO TABS: 150.0000 mg | ORAL_TABLET | Freq: Once | ORAL | 0 refills | Status: AC

## 2016-09-02 MED ORDER — ALBUTEROL SULFATE HFA 108 (90 BASE) MCG/ACT IN AERS: 2.0000 | INHALATION_SPRAY | Freq: Four times a day (QID) | RESPIRATORY_TRACT | 3 refills | Status: DC | PRN

## 2016-09-02 MED ORDER — HYDROXYZINE HCL 25 MG PO TABS: 25.0000 mg | ORAL_TABLET | Freq: Four times a day (QID) | ORAL | 0 refills | Status: DC | PRN

## 2016-09-02 MED ORDER — HYDROXYZINE HCL 25 MG PO TABS
25.0000 mg | ORAL_TABLET | Freq: Once | ORAL | Status: AC
  Administered 2016-09-02: 25 mg via ORAL
  Filled 2016-09-02: qty 1

## 2016-09-02 MED ORDER — DOXYCYCLINE HYCLATE 100 MG PO CAPS: 100.0000 mg | ORAL_CAPSULE | Freq: Two times a day (BID) | ORAL | 0 refills | Status: DC

## 2016-09-02 MED ORDER — VANCOMYCIN HCL IN DEXTROSE 1-5 GM/200ML-% IV SOLN
1000.0000 mg | Freq: Once | INTRAVENOUS | Status: AC
  Administered 2016-09-02: 1000 mg via INTRAVENOUS
  Filled 2016-09-02: qty 200

## 2016-09-02 MED ORDER — FENTANYL CITRATE (PF) 100 MCG/2ML IJ SOLN
50.0000 ug | Freq: Once | INTRAMUSCULAR | Status: AC
  Administered 2016-09-02: 50 ug via INTRAVENOUS
  Filled 2016-09-02: qty 2

## 2016-09-02 NOTE — ED Provider Notes (Signed)
[facility] DEPT Provider Note   CSN: [id number] Arrival date & time: 09/02/16  1737     History   Chief Complaint Chief Complaint  Patient presents with  . Recurrent Skin Infections    HPI [name] [name] is a 70 y.o. female presents emergency Department with chief complaint of skin infection under the right axilla. She has a past medical history ofobesity and recurrent skin infections. Patient was recently treated for cellulitis of the lower extremity with doxycycline. She states that she began having itching rash develop under the right axilla, which she felt was probably a fungal infection. He is now tender and is oozing and she has erythematous spread from this central area of weeping rash. She states that it continues to feel very itchy. She's had some chills without fever. She's been eating well and has no other complaints at this time.  HPI  Past Medical History:  Diagnosis Date  . Anemia   . Asthma 06/13/2011   hx of  . Atrial fibrillation ([place])   . Atrial flutter ([place])   . Breast cancer ([place]) dx'd 2005   No BP check or stick in Left arm  . CAD (coronary artery disease)    a. Nonobstructive CAD 2007 (EF 75%.  RCA  proximal 75% tubular, 25% prox LAD, 25% d1, 50% D2) at [facility] b. NSTEMI 01/2010 in setting of respiratory failure, medical approach (not a good candidate for noninvasive eval)  . Cellulitis    hx of cellulitis in left leg  . Cervical radiculopathy 06/13/2011  . COPD (chronic obstructive pulmonary disease) ([place])    on O2  . Depression 06/13/2011  . Diverticulosis   . Esophageal dysmotility   . Gout 06/13/2011  . History of Clostridium difficile early dec 2015   no current diarrhea  . History of home oxygen therapy    2 liters per nasal cannula all the time  . Hx of dysfunctional uterine bleeding    last bleeding jan 2016, worked up for with d and c x 2 no cause found  . Hyperlipidemia   . Hypertension   . Hypothyroidism   . Impaired glucose tolerance  11/01/2012  . Neuropathy ([place]) 06/13/2011  . Obesity hypoventilation syndrome ([place]) 06/13/2011  . OSA on CPAP   . PFO (patent foramen ovale)   . Risk for falls   . Syncope and collapse    Bradycardia with pauses. S/P pacemaker  . Tubular adenoma of colon     Patient Active Problem List   Diagnosis Date Noted  . History of left breast cancer 06/08/2016  . Panic attacks 04/27/2016  . Chest pain 04/11/2016  . OSA (obstructive sleep apnea) 02/12/2016  . Adhesive capsulitis of right shoulder 08/21/2015  . Right shoulder pain 07/16/2015  . Osteoarthritis, hand 07/16/2015  . Hair loss 07/16/2015  . Blood in mouth of unknown source 05/06/2015  . Dysphagia, pharyngoesophageal phase 05/06/2015  . Cellulitis of female breast 10/25/2014  . Rash and nonspecific skin eruption 10/25/2014  . Hx of colonic polyps   . Benign neoplasm of cecum   . Benign neoplasm of transverse colon   . CHF (congestive heart failure) ([place])   . History of Clostridium difficile 06/27/2014  . RUQ pain 05/31/2014  . Lump in the abdomen 05/31/2014  . Diarrhea 05/14/2014  . Abdominal pain, other specified site 05/14/2014  . Pacemaker 12/03/2013  . Exfoliative dermatitis 11/27/2013  . Urinary urgency 11/13/2013  . Post-menopausal bleeding 11/13/2013  . Syncope and collapse 08/30/2013  .  Atrial fibrillation ([place])   . Syncope 08/26/2013  . Postmenopausal vaginal bleeding 05/02/2013  . Tachy-brady syndrome ([place]) 03/15/2013  . Acute diastolic CHF (congestive heart failure) ([place]) 03/15/2013  . PFO (patent foramen ovale) 03/15/2013  . Hypokalemia 03/15/2013  . Atrial flutter ([place]) 03/01/2013  . Tachycardia 03/01/2013  . Anticoagulant long-term use 03/01/2013  . Impaired glucose tolerance 11/01/2012  . Abnormal liver function tests 11/01/2012  . Right lumbar radiculopathy 04/28/2012  . Cough 04/15/2012  . Abnormal TSH 04/15/2012  . Acute appendicitis 03/29/2012  . Sick sinus syndrome ([place]) 03/08/2012  . Morbid  obesity with BMI of 50.0-59.9, adult ([place]) 03/08/2012  . Breast cancer ([place]) 06/13/2011  . Asthma 06/13/2011  . Depression 06/13/2011  . Colon polyps 06/13/2011  . Neuropathy ([place]) 06/13/2011  . Cervical radiculopathy 06/13/2011  . Gout 06/13/2011  . Obesity hypoventilation syndrome ([place]) 06/13/2011  . Cellulitis of leg, right 06/13/2011  . Preventative health care 06/04/2011  . OXYGEN-USE OF SUPPLEMENTAL 04/07/2010  . ACUT MI SUBENDOCARDIAL INFARCT SUBSQT EPIS CARE 03/13/2010  . COPD (chronic obstructive pulmonary disease) ([place]) 03/10/2010  . Sleep apnea 03/10/2010  . HYPOTHYROIDISM 03/17/2009  . Hyperlipidemia 03/17/2009  . Essential hypertension 03/17/2009  . Coronary atherosclerosis 03/17/2009    Past Surgical History:  Procedure Laterality Date  . BREAST LUMPECTOMY Left   . CARDIOVERSION N/A 03/15/2013   Procedure: CARDIOVERSION;  Surgeon: Thayer Headings, MD;  Location: [facility] ENDOSCOPY;  Service: Cardiovascular;  Laterality: N/A;  . CERVICAL BIOPSY  June 2013   at [facility] for Heavy  Bleeding  . [place] SURGERY  2004 or 2005   have cadaver bones,, screws, and plates c 5 to c 6  . COLONOSCOPY    . COLONOSCOPY WITH PROPOFOL N/A 10/22/2014   Procedure: COLONOSCOPY WITH PROPOFOL;  Surgeon: Jerene Bears, MD;  Location: [facility] ENDOSCOPY;  Service: Gastroenterology;  Laterality: N/A;  . DILATION AND CURETTAGE OF UTERUS    . LAPAROSCOPIC APPENDECTOMY  03/29/2012   Procedure: APPENDECTOMY LAPAROSCOPIC;  Surgeon: Adin Hector, MD;  Location: [facility] ORS;  Service: General;  Laterality: N/A;  . LOOP RECORDER EXPLANT  08/31/2013   Procedure: LOOP RECORDER EXPLANT;  Surgeon: Coralyn Mark, MD;  Location: [facility] C[facility] LAB;  Service: Cardiovascular;;  . LOOP RECORDER IMPLANT  08-28-2013; 08-31-2013   MDT LinQ implanted by Dr Rayann Heman for syncope; explanted 08-31-2013 after sinus pauses identified  . LOOP RECORDER IMPLANT N/A 08/28/2013   Procedure: LOOP RECORDER IMPLANT;  Surgeon: Coralyn Mark, MD;   Location: [facility] CATH LAB;  Service: Cardiovascular;  Laterality: N/A;  . OVARIAN CYST REMOVAL    . PACEMAKER INSERTION  08-31-2013   Biotronik Evera dual chamber pacemaker placed for neurocardiogenic syncope and sinus pauses by Dr Rayann Heman  . PERMANENT PACEMAKER INSERTION N/A 08/31/2013   Procedure: PERMANENT PACEMAKER INSERTION;  Surgeon: Coralyn Mark, MD;  Location: [facility] CATH LAB;  Service: Cardiovascular;  Laterality: N/A;  . TEE WITHOUT CARDIOVERSION N/A 03/15/2013   Procedure: TRANSESOPHAGEAL ECHOCARDIOGRAM (TEE);  Surgeon: Thayer Headings, MD;  Location: [facility];  Service: Cardiovascular;  Laterality: N/A;    OB History    No data available       Home Medications    Prior to Admission medications   Medication Sig Start Date End Date Taking? Authorizing Provider  albuterol (PROVENTIL HFA;VENTOLIN HFA) 108 (90 Base) MCG/ACT inhaler Inhale 2 puffs into the lungs every 6 (six) hours as needed for wheezing or shortness of breath. 09/02/16   Tammy S Parrett,  NP  allopurinol (ZYLOPRIM) 300 MG tablet TAKE 1 TABLET DAILY 08/20/16   Biagio Borg, MD  amitriptyline (ELAVIL) 100 MG tablet take 1 tablet by mouth at bedtime if needed for sleep 02/19/16   Biagio Borg, MD  Cholecalciferol (VITAMIN D) 2000 UNITS tablet Take 2,000 Units by mouth at bedtime.    Historical Provider, MD  clonazePAM (KLONOPIN) 0.5 MG tablet Take 1 tablet (0.5 mg total) by mouth 2 (two) times daily as needed for anxiety. 07/28/16   Biagio Borg, MD  diclofenac sodium (VOLTAREN) 1 % GEL Apply 4 g topically 4 (four) times daily as needed. 07/16/15   Biagio Borg, MD  doxycycline (VIBRAMYCIN) 100 MG capsule Take 1 capsule (100 mg total) by mouth 2 (two) times daily. 07/28/16   Biagio Borg, MD  DULoxetine (CYMBALTA) 30 MG capsule TAKE 1 CAPSULE TWICE A DAY 06/29/16   Biagio Borg, MD  ELIQUIS 5 MG TABS tablet TAKE 1 TABLET TWICE A DAY 08/20/16   Biagio Borg, MD  ferrous sulfate 325 (65 FE) MG tablet Take 325 mg by mouth daily  with breakfast.    Historical Provider, MD  furosemide (LASIX) 40 MG tablet TAKE 1 TABLET EVERY MORNING AND 1 TABLET EVERY OTHER NIGHT 08/31/16   Biagio Borg, MD  levothyroxine (SYNTHROID, LEVOTHROID) 200 MCG tablet TAKE 1 TABLET DAILY 08/20/16   Biagio Borg, MD  metoprolol tartrate (LOPRESSOR) 25 MG tablet Take 1 tablet (25 mg total) by mouth 2 (two) times daily. Reported on 02/12/2016 03/10/16   Biagio Borg, MD  Omega-3 Fatty Acids (FISH OIL PO) Take 1 capsule by mouth daily.     Historical Provider, MD  omeprazole (PRILOSEC) 40 MG capsule Take 1 capsule (40 mg total) by mouth daily. 07/16/15   Jerene Bears, MD  potassium chloride SA (K-DUR,KLOR-CON) 20 MEQ tablet Take 1 tablet (20 mEq total) by mouth 2 (two) times daily. Please schedule appointment for refills. 08/04/16   Lelon Perla, MD  pravastatin (PRAVACHOL) 40 MG tablet TAKE 1 TABLET DAILY 07/05/16   Lelon Perla, MD  traMADol (ULTRAM) 50 MG tablet Take 1 tablet (50 mg total) by mouth every 8 (eight) hours as needed. 03/10/16   Biagio Borg, MD  vitamin C (ASCORBIC ACID) 500 MG tablet Take 500 mg by mouth daily.     Historical Provider, MD    Family History Family History  Problem Relation Age of Onset  . Uterine cancer Mother   . Heart disease Mother   . Breast cancer Sister 32  . Ovarian cancer Sister   . Breast cancer Cousin 65  . Diabetes Brother   . Breast cancer Maternal Aunt   . Ovarian cancer Sister     Social History Social History  Substance Use Topics  . Smoking status: Former Smoker    Packs/day: 1.00    Years: 20.00    Types: Cigarettes    Quit date: 09/20/1998  . Smokeless tobacco: Never Used     Comment: 1ppd x 20 years  . Alcohol use 0.6 oz/week    1 Glasses of wine per week     Comment: rare     Allergies   Cephalexin; Codeine; Dilaudid [hydromorphone]; Lisinopril; Penicillins; and Cleocin [clindamycin hcl]   Review of Systems Review of Systems  Ten systems reviewed and are negative for  acute change, except as noted in the HPI.   Physical Exam Updated Vital Signs BP 141/95   Pulse  103   Temp 97.9 F (36.6 C) (Oral)   Resp 17   SpO2 95%   Physical Exam  Constitutional: She is oriented to person, place, and time. She appears well-developed and well-nourished. No distress.  HENT:  Head: Normocephalic and atraumatic.  Eyes: Conjunctivae are normal. No scleral icterus.  Neck: Normal range of motion.  Cardiovascular: Normal rate, regular rhythm and normal heart sounds.  Exam reveals no gallop and no friction rub.   No murmur heard. Pulmonary/Chest: Effort normal and breath sounds normal. No respiratory distress.  Abdominal: Soft. Bowel sounds are normal. She exhibits no distension and no mass. There is no tenderness. There is no guarding.  Neurological: She is alert and oriented to person, place, and time.  Skin: Skin is warm and dry. She is not diaphoretic.  Under the right axilla there is an area of hyperpigmented, hypertrophic skin with macerated tissue. There is some surrounding erythema extending up the right arm without lymphangitis. Tender to palpation in the areas of erythema.     ED Treatments / Results  Labs (all labs ordered are listed, but only abnormal results are displayed) Labs Reviewed  COMPREHENSIVE METABOLIC PANEL - Abnormal; Notable for the following:       Result Value   Chloride 100 (*)    CO2 34 (*)    Creatinine, Ser 1.01 (*)    GFR calc non Af Amer 55 (*)    All other components within normal limits  I-STAT CG4 LACTIC ACID, ED - Abnormal; Notable for the following:    Lactic Acid, Venous 2.71 (*)    All other components within normal limits  CULTURE, BLOOD (ROUTINE X 2)  CULTURE, BLOOD (ROUTINE X 2)  URINE CULTURE  CBC WITH DIFFERENTIAL/PLATELET  COMPREHENSIVE METABOLIC PANEL  CBC WITH DIFFERENTIAL/PLATELET  PROTIME-INR  URINALYSIS, ROUTINE W REFLEX MICROSCOPIC  I-STAT BETA HCG BLOOD, ED ([facility], [facility], [facility] ONLY)  I-STAT CG4 LACTIC ACID,  ED    EKG  EKG Interpretation None       Radiology No results found.  Procedures Procedures (including critical care time)  Medications Ordered in ED Medications  sodium chloride 0.9 % bolus 500 mL (not administered)     Initial Impression / Assessment and Plan / ED Course  I have reviewed the triage vital signs and the nursing notes.  Pertinent labs & imaging results that were available during my care of the patient were reviewed by me and considered in my medical decision making (see chart for details).  Clinical Course as of Sep 04 34  Thu Sep 02, 2016  1928 Lactic Acid, Venous: (!!) 2.71 [AH]  1949 Patient Not pregnant. I-stat hCG, quantitative: <5.0 [AH]  1949 No leukocytosis WBC: 6.1 [AH]  2335 Lactate improved after fluids.  Lactic Acid, Venous: 1.63 [AH]    Clinical Course User Index [AH] Margarita Mail, PA-C   Protocol orders placed in triage by nursing staff. Because of patient's elevated lactic acid. I do not feel that her lactic acid at all reflects sepsis. The patient appears to have a macerated fungal infection under the right axilla with secondary bacterial infection of the skin. Patient given antibiotic treatment here in the ED. She will be given one dose of Diflucan as the patient states she frequently gets yeast vaginitis as well. She may repeat this dose in 1 week after completing course of antibiotics for cellulitis. She is afebrile, hemodynamically stable. Patient has been congenial talkative, mentating well throughout her visit. She has close outpatient follow-up  and appears safe for discharge at this time.  Medications  sodium chloride 0.9 % bolus 500 mL (0 mLs Intravenous Stopped 09/03/16 0047)  sodium chloride 0.9 % bolus 500 mL (0 mLs Intravenous Stopped 09/03/16 0047)  hydrOXYzine (ATARAX/VISTARIL) tablet 25 mg (25 mg Oral Given 09/02/16 2004)  fluconazole (DIFLUCAN) tablet 150 mg (150 mg Oral Given 09/02/16 2004)  vancomycin (VANCOCIN) IVPB  1000 mg/200 mL premix (0 mg Intravenous Stopped 09/03/16 0047)  fentaNYL (SUBLIMAZE) injection 50 mcg (50 mcg Intravenous Given 09/02/16 2200)  ' Final Clinical Impressions(s) / ED Diagnoses   Final diagnoses:  Intertrigo  Cellulitis of other specified site    New Prescriptions New Prescriptions   No medications on file     Margarita Mail, PA-C 09/04/16 0043    Milton Ferguson, MD 09/06/16 1303

## 2016-09-02 NOTE — ED Notes (Signed)
CRITICAL VALUE ALERT  Critical value received:  Lactic acid 2.71  Date of notification:  09/02/2016  Time of notification:  3419  Critical value read back:Yes.    Nurse who received alert:  Leodis Rains  MD notified (1st page):  Dr. Leonette Monarch  Time of first page:  1836  MD notified and advised to get pt room as soon as possible. Will change pt's acuity to level 2 and find room for pt. Will add sepsis orders per MD

## 2016-09-02 NOTE — Discharge Instructions (Signed)
You have a fungal infection of the axilla (armpit) with a secondary bacterial infection. You will need to take oral antibiotics and apply a topical antifungal cream. You will also need to follow up with your primary care doctor in 2 days to look at the wound. I am giving you a repeat dose of diflucan which you may take in 7-10 days if you develop yeast vaginitis. You have been diagnosed with cellulitis.    HOME CARE INSTRUCTIONS  Take your antibiotics as directed. Finish them even if you start to feel better. Keep the infected arm or leg elevated to reduce swelling. Apply a warm cloth to the affected area up to 4 times per day to relieve pain. Only take over-the-counter or prescription medicines for pain, discomfort, or fever as directed by your caregiver. Keep all follow-up appointments as directed by your caregiver. SEEK MEDICAL CARE IF:  You notice red streaks coming from the infected area. Your red area gets larger or turns dark in color. Your bone or joint underneath the infected area becomes painful after the skin has healed. Your infection returns in the same area or another area. You notice a swollen bump in the infected area. You develop new symptoms. SEEK IMMEDIATE MEDICAL CARE IF:  You have a fever. You feel very sleepy. You develop vomiting or diarrhea. You have a general ill feeling (malaise) with muscle aches and pains.

## 2016-09-02 NOTE — ED Triage Notes (Signed)
Pt was recently treated for cellulitis which resolved. She now has new cellulitis under her right axilla. Redness and swelling with weeping noted.

## 2016-09-02 NOTE — ED Notes (Signed)
Elevated CG-4 reported to Hope-RN at [facility].  She stated she would notify EDP

## 2016-09-03 DIAGNOSIS — L304 Erythema intertrigo: Secondary | ICD-10-CM | POA: Diagnosis not present

## 2016-09-07 LAB — CULTURE, BLOOD (ROUTINE X 2): Culture: NO GROWTH

## 2016-09-09 ENCOUNTER — Telehealth: Payer: Self-pay | Admitting: *Deleted

## 2016-09-09 MED ORDER — TRAMADOL HCL 50 MG PO TABS: 50.0000 mg | ORAL_TABLET | Freq: Three times a day (TID) | ORAL | 2 refills | Status: DC | PRN

## 2016-09-09 NOTE — Telephone Encounter (Signed)
Left msg on triage stating was seen in the ER last week for cellulitis. Was given antibiotic, but not anything for pain. She was taking Tramadol, but do not have bottle. Pt is requesting refill to be call into her pharmacy...Johny Chess

## 2016-09-09 NOTE — Telephone Encounter (Signed)
Done hardcopy to Corinne  

## 2016-09-10 NOTE — Telephone Encounter (Signed)
faxed

## 2016-09-13 ENCOUNTER — Other Ambulatory Visit: Payer: Self-pay | Admitting: Internal Medicine

## 2016-09-13 DIAGNOSIS — Z950 Presence of cardiac pacemaker: Secondary | ICD-10-CM

## 2016-09-13 DIAGNOSIS — I10 Essential (primary) hypertension: Secondary | ICD-10-CM

## 2016-09-16 ENCOUNTER — Telehealth: Payer: Self-pay | Admitting: Internal Medicine

## 2016-09-16 NOTE — Telephone Encounter (Signed)
Patient has scheduled ER follow [facility]) up for cellulites.  Patient is requesting something for itch.  She is also requesting refill on ketoconazole 2% for fungus under arm.  Patient uses [facility] on [address].  I have scheduled patient to be seen on 09/21/16.

## 2016-09-16 NOTE — Telephone Encounter (Signed)
Hinds for topical benadryl cream for itching. The other cream can be addressed at her OV, thanks

## 2016-09-17 NOTE — Telephone Encounter (Signed)
Left message for patient

## 2016-09-21 ENCOUNTER — Ambulatory Visit (INDEPENDENT_AMBULATORY_CARE_PROVIDER_SITE_OTHER): Payer: Medicare Other | Admitting: Internal Medicine

## 2016-09-21 ENCOUNTER — Encounter: Payer: Self-pay | Admitting: Internal Medicine

## 2016-09-21 ENCOUNTER — Other Ambulatory Visit (INDEPENDENT_AMBULATORY_CARE_PROVIDER_SITE_OTHER): Payer: Medicare Other

## 2016-09-21 VITALS — BP 124/80 | HR 84 | Resp 20 | Wt 327.0 lb

## 2016-09-21 VITALS — BP 124/80 | HR 84 | Ht 65.5 in | Wt 327.0 lb

## 2016-09-21 DIAGNOSIS — E039 Hypothyroidism, unspecified: Secondary | ICD-10-CM

## 2016-09-21 DIAGNOSIS — B359 Dermatophytosis, unspecified: Secondary | ICD-10-CM

## 2016-09-21 DIAGNOSIS — R0609 Other forms of dyspnea: Secondary | ICD-10-CM | POA: Diagnosis not present

## 2016-09-21 DIAGNOSIS — J449 Chronic obstructive pulmonary disease, unspecified: Secondary | ICD-10-CM

## 2016-09-21 DIAGNOSIS — R21 Rash and other nonspecific skin eruption: Secondary | ICD-10-CM | POA: Diagnosis not present

## 2016-09-21 DIAGNOSIS — I10 Essential (primary) hypertension: Secondary | ICD-10-CM | POA: Diagnosis not present

## 2016-09-21 DIAGNOSIS — R0600 Dyspnea, unspecified: Secondary | ICD-10-CM | POA: Insufficient documentation

## 2016-09-21 LAB — CBC WITH DIFFERENTIAL/PLATELET
BASOS PCT: 0.5 % (ref 0.0–3.0)
Basophils Absolute: 0 10*3/uL (ref 0.0–0.1)
EOS ABS: 0.2 10*3/uL (ref 0.0–0.7)
EOS PCT: 3.6 % (ref 0.0–5.0)
HCT: 40.7 % (ref 36.0–46.0)
Hemoglobin: 13.2 g/dL (ref 12.0–15.0)
LYMPHS ABS: 1.3 10*3/uL (ref 0.7–4.0)
Lymphocytes Relative: 29.4 % (ref 12.0–46.0)
MCHC: 32.3 g/dL (ref 30.0–36.0)
MCV: 90.6 fl (ref 78.0–100.0)
MONO ABS: 0.2 10*3/uL (ref 0.1–1.0)
Monocytes Relative: 5.8 % (ref 3.0–12.0)
NEUTROS ABS: 2.6 10*3/uL (ref 1.4–7.7)
Neutrophils Relative %: 60.7 % (ref 43.0–77.0)
PLATELETS: 172 10*3/uL (ref 150.0–400.0)
RBC: 4.49 Mil/uL (ref 3.87–5.11)
RDW: 15.1 % (ref 11.5–15.5)
WBC: 4.3 10*3/uL (ref 4.0–10.5)

## 2016-09-21 LAB — BRAIN NATRIURETIC PEPTIDE: Pro B Natriuretic peptide (BNP): 206 pg/mL — ABNORMAL HIGH (ref 0.0–100.0)

## 2016-09-21 LAB — TSH: TSH: 21.35 u[IU]/mL — AB (ref 0.35–4.50)

## 2016-09-21 MED ORDER — BUDESONIDE-FORMOTEROL FUMARATE 160-4.5 MCG/ACT IN AERO: 2.0000 | INHALATION_SPRAY | Freq: Two times a day (BID) | RESPIRATORY_TRACT | 0 refills | Status: DC

## 2016-09-21 MED ORDER — KETOCONAZOLE 2 % EX CREA: 1.0000 "application " | TOPICAL_CREAM | Freq: Every day | CUTANEOUS | 0 refills | Status: DC

## 2016-09-21 MED ORDER — HYDROXYZINE HCL 25 MG PO TABS: 25.0000 mg | ORAL_TABLET | Freq: Four times a day (QID) | ORAL | 1 refills | Status: DC | PRN

## 2016-09-21 MED ORDER — KETOCONAZOLE 200 MG PO TABS: 200.0000 mg | ORAL_TABLET | Freq: Every day | ORAL | 0 refills | Status: DC

## 2016-09-21 MED ORDER — POTASSIUM CHLORIDE CRYS ER 20 MEQ PO TBCR: 20.0000 meq | EXTENDED_RELEASE_TABLET | Freq: Two times a day (BID) | ORAL | 1 refills | Status: DC

## 2016-09-21 NOTE — Assessment & Plan Note (Addendum)
Body mass index is 53.59 - no real change Lab Results  Component Value Date   TSH 21.35 (H) 09/21/2016     Contributing to gerd tendency/ doe/reviewed the need and the process to achieve and maintain neg calorie balance > defer f/u primary care including intermittently monitoring thyroid status

## 2016-09-21 NOTE — Patient Instructions (Addendum)
.  Please take all new medication as prescribed - the ketoconzole  OK to take with the Eliqjuis  Please continue all other medications as before, and refills have been done if requested - the cream  Please have the pharmacy call with any other refills you may need.  Please keep your appointments with your specialists as you may have planned  Please return in 3 months, or sooner if needed

## 2016-09-21 NOTE — Progress Notes (Signed)
Subjective:    Patient ID: Rich Number, female    DOB: [birth year], 71 y.o.   MRN: [id number]  HPI PCP - Jenny Reichmann   71 yo  morbidly obese female quit smoking in 2000 for FU of copd & OSA on 10 cm.  PMH -sleep apnea, hypertension,breast cancer with left lumpectomy and hypothyroidism , dCHF , PFO    TEST :   Duplex neg DVT, CT angio neg PE, echo >> nml LV fn, mod LVH.   PFTs showed moderate airway obstruction FEV1 48%, no BD response, mild restriction, DLCO corrects for alveolar volume c/w obesity  PSG 03/30/10 surprisingly showed AHI 0.7/h & RDI 9/h, 36 RERAs over 249 mins TST, lowest desatn 92% on 2 L [place]  July 2011 -ONO showed significant desaturation for > 4h  ECHO TEE >EF ok, PFO +bubble   06/11/2016 NP  Follow up : OSA /OHS  Pt returns for 4 month follow up .  She says she  Is doing well on CPAP .  Wearing CPAP At bedtime  , wears for 8hr each night.  Feels rested. Says she is doing very well, feels best in long time.  Download shows excellent compliance with average usage at 8.5 hours. She is on a set pressure at 10 cm of H2O. AHI 0.3. Leaks. Minimum. She is now with [employer] . Getting better customer service.  She denies any chest pain, orthopnea, PND, or increased leg swelling Has some moderate Moderate COPD , previously on Symbicort and spriiva did not seem to make any difference. Does not like to take a lot of meds esp if they are not helping.  Uses ventolin on occasion . No flare of cough, wheeizng or dyspnea.  rec Continue on CPAP At bedtime  -keep up good work  Work on weight loss.  Do not drive if sleepy.  Flu shot today  Activity as tolerated.  Ventolin As needed  For wheezing/shortness of breathing .  Follow up Dr. Elsworth Soho  In  6 months  and As needed     09/21/2016 acute extended ov/Keandra Medero re: COPD GOLD III/ no maint / cpap no 02  Chief Complaint  Patient presents with  . Acute Visit    Pt c/o increased SOB and congestion- this has been going on off and on for the past 3  months. She states she can not perform daily activities due to SOB. She has cough with clear sputum.  She states she has had swelling in her right leg- recently txed for cellulitis.   since flu shot more noted more doe assoc with  dry cough > no better with ventolin but hfa poor  Breathing overall  worse x 2 weeks (but not worse when seen in ER [id number] for itchy rash)   Voice is very hoarse  Ok at hs cpap and no noct or early am resp symptoms/ excess/ purulent sputum or mucus plugs     No obvious day to day or daytime variability or assoc cp or chest tightness, subjective wheeze or overt sinus or hb symptoms. No unusual exp hx or h/o childhood pna/ asthma or knowledge of premature birth.  Sleeping ok without nocturnal  or early am exacerbation  of respiratory  c/o's or need for noct saba. Also denies any obvious fluctuation of symptoms with weather or environmental changes or other aggravating or alleviating factors except as outlined above   Current Medications, Allergies, Complete Past Medical History, Past Surgical History, Family History, and Social History were reviewed  in [employer] record.  ROS  The following are not active complaints unless bolded sore throat, dysphagia, dental problems, itching, sneezing,  nasal congestion or excess/ purulent secretions, ear ache,   fever, chills, sweats, unintended wt loss, classically pleuritic or exertional cp, hemoptysis,  orthopnea pnd or leg swelling R> L , presyncope, palpitations, abdominal pain, anorexia, nausea, vomiting, diarrhea  or change in bowel or bladder habits, change in stools or urine, dysuria,hematuria,  rash, arthralgias, visual complaints, headache, numbness, weakness or ataxia or problems with walking or coordination,  change in mood/affect or memory.                 Objective:   Physical Exam    Wt Readings from Last 3 Encounters:  09/21/16 (!) 327 lb (148.3 kg)  07/28/16 (!) 331 lb (150.1 kg)   07/06/16 (!) 319 lb (144.7 kg)    Vital signs reviewed - Note on arrival 02 sats  95% on RA       Gen. Pleasant, morbidly obese, in no distress, walks with a pink  Walker/ harsh barking upper airway quality cough  ENT - no lesions, no post nasal drip, class 3 MP airway  Neck: No JVD, no thyromegaly, no carotid bruits Lungs: no use of accessory muscles, no dullness to percussion, decreased without rales or rhonchi  Cardiovascular: Rhythm regular, heart sounds  normal, no murmurs or gallops, trace peripheral edema Musculoskeletal: No deformities, no cyanosis or clubbing , no tremors      I personally reviewed images and agree with radiology impression as follows:  CXR:   09/02/16 Cardiomegaly without acute disease.   Labs ordered 09/21/2016  Allergy profile  Lab Results  Component Value Date   TSH 21.35 (H) 09/21/2016

## 2016-09-21 NOTE — Patient Instructions (Addendum)
Plan A = Automatic = Symbicort 160 Take 2 puffs first thing in am and then another 2 puffs about 12 hours later.   Work on inhaler technique:  relax and gently blow all the way out then take a nice smooth deep breath back in, triggering the inhaler at same time you start breathing in.  Hold for up to 5 seconds if you can. Blow out thru nose. Rinse and gargle with water when done    Plan B = Backup Only use your albuterol as a rescue medication to be used if you can't catch your breath by resting or doing a relaxed purse lip breathing pattern.  - The less you use it, the better it will work when you need it. - Ok to use the inhaler up to 2 puffs  every 4 hours if you must but call for appointment if use goes up over your usual need - Don't leave home without it !!  (think of it like the spare tire for your car)   Please remember to go to the lab  department downstairs for your tests - we will call you with the results when they are available.  Try [id number]  Take 30-60 min before first meal of the day and add  Pepcid ac (famotidine) 20 mg one @  bedtime until cough is completely gone for at least a week without the need for cough suppression  GERD (REFLUX)  is an extremely common cause of respiratory symptoms just like yours , many times with no obvious heartburn at all.    It can be treated with medication, but also with lifestyle changes including elevation of the head of your bed (ideally with 6 inch  bed blocks),  Smoking cessation, avoidance of late meals, excessive alcohol, and avoid fatty foods, chocolate, peppermint, colas, red wine, and acidic juices such as orange juice.  NO MINT OR MENTHOL PRODUCTS SO NO COUGH DROPS   USE SUGARLESS CANDY INSTEAD (Jolley ranchers or Stover's or Life Savers) or even ice chips will also do - the key is to swallow to prevent all throat clearing. NO OIL BASED VITAMINS - use powdered substitutes.    See Tammy NP w/in 2 weeks with all your medications,  even over the counter meds, separated in two separate bags, the ones you take no matter what vs the ones you stop once you feel better and take only as needed when you feel you need them.   Tammy  will generate for you a new user friendly medication calendar that will put [place] on the same page re: your medication use.     Without this process, it simply isn't possible to assure that we are providing  your outpatient care  with  the attention to detail we feel you deserve.   If we cannot assure that you're getting that kind of care,  then we cannot manage your problem effectively from this clinic.  Once you have seen Tammy and we are sure that we're all on the same page with your medication use she will arrange follow up with me.

## 2016-09-21 NOTE — Progress Notes (Signed)
Pre visit review using our clinic review tool, if applicable. No additional management support is needed unless otherwise documented below in the visit note. 

## 2016-09-21 NOTE — Progress Notes (Signed)
Subjective:    Patient ID: [name] [name], female    DOB: 1946-04-30, 71 y.o.   MRN: [id number]  HPI  Here to f/u, is s/p recent tx with doxy and ketocon cream with improved right axilla, but with more diffuse itchy rash to back, numerous light areas without erythema, swelling.  Pt denies chest pain, increased sob or doe, wheezing, orthopnea, PND, increased LE swelling, palpitations, dizziness or syncope.  Pt denies new neurological symptoms such as new headache, or facial or extremity weakness or numbness   Pt denies polydipsia, polyuria Past Medical History:  Diagnosis Date  . Anemia   . Asthma 06/13/2011   hx of  . Atrial fibrillation ([place])   . Atrial flutter ([place])   . Breast cancer ([place]) dx'd 2005   No BP check or stick in Left arm  . CAD (coronary artery disease)    a. Nonobstructive CAD 2007 (EF 75%.  RCA  proximal 75% tubular, 25% prox LAD, 25% d1, 50% D2) at [facility]. b. NSTEMI 01/2010 in setting of respiratory failure, medical approach (not a good candidate for noninvasive eval)  . Cellulitis    hx of cellulitis in left leg  . Cervical radiculopathy 06/13/2011  . COPD (chronic obstructive pulmonary disease) ([place])    on O2  . Depression 06/13/2011  . Diverticulosis   . Esophageal dysmotility   . Gout 06/13/2011  . History of Clostridium difficile early dec 2015   no current diarrhea  . History of home oxygen therapy    2 liters per nasal cannula all the time  . Hx of dysfunctional uterine bleeding    last bleeding jan 2016, worked up for with d and c x 2 no cause found  . Hyperlipidemia   . Hypertension   . Hypothyroidism   . Impaired glucose tolerance 11/01/2012  . Neuropathy ([place]) 06/13/2011  . Obesity hypoventilation syndrome ([place]) 06/13/2011  . OSA on CPAP   . PFO (patent foramen ovale)   . Risk for falls   . Syncope and collapse    Bradycardia with pauses. S/P pacemaker  . Tubular adenoma of colon    Past Surgical History:  Procedure Laterality Date  . BREAST  LUMPECTOMY Left   . CARDIOVERSION N/A 03/15/2013   Procedure: CARDIOVERSION;  Surgeon: Thayer Headings, MD;  Location: [facility] ENDOSCOPY;  Service: Cardiovascular;  Laterality: N/A;  . CERVICAL BIOPSY  June 2013   at [facility] for Heavy  Bleeding  . [place] SURGERY  2004 or 2005   have cadaver bones,, screws, and plates c 5 to c 6  . COLONOSCOPY    . COLONOSCOPY WITH PROPOFOL N/A 10/22/2014   Procedure: COLONOSCOPY WITH PROPOFOL;  Surgeon: Jerene Bears, MD;  Location: [facility] ENDOSCOPY;  Service: Gastroenterology;  Laterality: N/A;  . DILATION AND CURETTAGE OF UTERUS    . LAPAROSCOPIC APPENDECTOMY  03/29/2012   Procedure: APPENDECTOMY LAPAROSCOPIC;  Surgeon: Adin Hector, MD;  Location: [facility] ORS;  Service: General;  Laterality: N/A;  . LOOP RECORDER EXPLANT  08/31/2013   Procedure: LOOP RECORDER EXPLANT;  Surgeon: Coralyn Mark, MD;  Location: [facility] CATH LAB;  Service: Cardiovascular;;  . LOOP RECORDER IMPLANT  08-28-2013; 08-31-2013   MDT LinQ implanted by Dr Rayann Heman for syncope; explanted 08-31-2013 after sinus pauses identified  . LOOP RECORDER IMPLANT N/A 08/28/2013   Procedure: LOOP RECORDER IMPLANT;  Surgeon: Coralyn Mark, MD;  Location: [place] CATH LAB;  Service: Cardiovascular;  Laterality: N/A;  . OVARIAN CYST REMOVAL    .  PACEMAKER INSERTION  08-31-2013   Biotronik Evera dual chamber pacemaker placed for neurocardiogenic syncope and sinus pauses by Dr Rayann Heman  . PERMANENT PACEMAKER INSERTION N/A 08/31/2013   Procedure: PERMANENT PACEMAKER INSERTION;  Surgeon: Coralyn Mark, MD;  Location: [place] CATH LAB;  Service: Cardiovascular;  Laterality: N/A;  . TEE WITHOUT CARDIOVERSION N/A 03/15/2013   Procedure: TRANSESOPHAGEAL ECHOCARDIOGRAM (TEE);  Surgeon: Thayer Headings, MD;  Location[facility] ENDOSCOPY;  Service: Cardiovascular;  Laterality: N/A;    reports that she quit smoking about 18 years ago. Her smoking use included Cigarettes. She has a 20.00 pack-year smoking history. She has never used smokeless  tobacco. She reports that she drinks about 0.6 oz of alcohol per week . She reports that she does not use drugs. family history includes Breast cancer in her maternal aunt; Breast cancer (age of onset: 79) in her sister; Breast cancer (age of onset: 58) in her cousin; Diabetes in her brother; Heart disease in her mother; Ovarian cancer in her sister and sister; Uterine cancer in her mother. Allergies  Allergen Reactions  . Ciprofloxacin Other (See Comments)    foliculitis  . Cephalexin Other (See Comments)    Exfoliative dermatitis reaction  . Codeine     REACTION: unknown - pt. states unaware of having reaction to codeine  . Dilaudid [Hydromorphone] Other (See Comments)    Other Reaction: Oversedation  . Lisinopril     REACTION: cough  . Penicillins     rash  . Cleocin [Clindamycin Hcl] Rash    rash  . Doxycycline Rash   Current Outpatient Prescriptions on File Prior to Visit  Medication Sig Dispense Refill  . allopurinol (ZYLOPRIM) 300 MG tablet TAKE 1 TABLET DAILY 90 tablet 1  . budesonide-formoterol (SYMBICORT) 160-4.5 MCG/ACT inhaler Inhale 2 puffs into the lungs 2 (two) times daily. 1 Inhaler 0  . Cholecalciferol (VITAMIN D) 2000 UNITS tablet Take 2,000 Units by mouth at bedtime.    . clonazePAM (KLONOPIN) 0.5 MG tablet Take 1 tablet (0.5 mg total) by mouth 2 (two) times daily as needed for anxiety. 60 tablet 2  . diclofenac sodium (VOLTAREN) 1 % GEL Apply 4 g topically 4 (four) times daily as needed. 400 g 5  . DULoxetine (CYMBALTA) 30 MG capsule Take 30 mg by mouth daily.    [name] ELIQUIS 5 MG TABS tablet TAKE 1 TABLET TWICE A DAY 180 tablet 3  . ferrous sulfate 325 (65 FE) MG tablet Take 325 mg by mouth daily with breakfast.    . furosemide (LASIX) 40 MG tablet TAKE 1 TABLET EVERY MORNING AND 1 TABLET EVERY OTHER NIGHT 180 tablet 2  . levothyroxine (SYNTHROID, LEVOTHROID) 200 MCG tablet TAKE 1 TABLET DAILY 90 tablet 3  . metoprolol tartrate (LOPRESSOR) 25 MG tablet Take 25 mg by  mouth 2 (two) times daily. 1 every am and 1/2 at night    . Omega-3 Fatty Acids (FISH OIL PO) Take 1 capsule by mouth daily.     [name] omeprazole (PRILOSEC) 40 MG capsule Take 1 capsule (40 mg total) by mouth daily. 90 capsule 3  . pravastatin (PRAVACHOL) 40 MG tablet TAKE 1 TABLET DAILY 90 tablet 0  . PROAIR HFA 108 (90 Base) MCG/ACT inhaler Inhale 2 puffs into the lungs every 4 (four) hours as needed.    . traMADol (ULTRAM) 50 MG tablet Take 1 tablet (50 mg total) by mouth every 8 (eight) hours as needed. 90 tablet 2  . vitamin C (ASCORBIC ACID) 500 MG tablet  Take 500 mg by mouth daily.      No current facility-administered medications on file prior to visit.    Review of Systems  Constitutional: Negative for unusual diaphoresis or night sweats HENT: Negative for ear swelling or discharge Eyes: Negative for worsening visual haziness  Respiratory: Negative for choking and stridor.   Gastrointestinal: Negative for distension or worsening eructation Genitourinary: Negative for retention or change in urine volume.  Musculoskeletal: Negative for other MSK pain or swelling Skin: Negative for color change and worsening wound Neurological: Negative for tremors and numbness other than noted  Psychiatric/Behavioral: Negative for decreased concentration or agitation other than above   All other system neg per pt    Objective:   Physical Exam BP 124/80   Pulse 84   Resp 20   Wt (!) 327 lb (148.3 kg)   SpO2 95%   BMI 53.59 kg/m  VS noted,  Constitutional: Pt appears in no apparent distress HENT: Head: NCAT.  Right Ear: External ear normal.  Left Ear: External ear normal.  Eyes: . Pupils are equal, round, and reactive to light. Conjunctivae and EOM are normal Neck: Normal range of motion. Neck supple.  Cardiovascular: Normal rate and regular rhythm.   Pulmonary/Chest: Effort normal and breath sounds decreased without rales or wheezing.  Neurological: Pt is alert. Not confused , motor grossly  intact Skin: Skin is warm. With diffuse tinea like rahs to back, right axilla is clear Psychiatric: Pt behavior is normal. No agitation.  No other new exam findings    Assessment & Plan:

## 2016-09-22 ENCOUNTER — Telehealth: Payer: Self-pay | Admitting: Pulmonary Disease

## 2016-09-22 LAB — RESPIRATORY ALLERGY PROFILE REGION II ~~LOC~~
Allergen, A. alternata, m6: <0.1 kU/L
Allergen, Cedar tree, t12: <0.1 kU/L
Allergen, Comm Silver Birch, t9: <0.1 kU/L
Allergen, Mouse Urine Protein, e78: <0.1 kU/L
Allergen, Oak,t7: <0.1 kU/L
Aspergillus fumigatus, m3: <0.1 kU/L
Bermuda Grass: <0.1 kU/L
Box Elder IgE: <0.1 kU/L
Cat Dander: <0.1 kU/L
Cockroach: <0.1 kU/L
Common Ragweed: <0.1 kU/L
IGE (IMMUNOGLOBULIN E), SERUM: 61 kU/L (ref <115)
Johnson Grass: <0.1 kU/L
Rough Pigweed  IgE: <0.1 kU/L
Sheep Sorrel IgE: <0.1 kU/L
Timothy Grass: <0.1 kU/L

## 2016-09-22 NOTE — Assessment & Plan Note (Signed)
Supposed to be on 200 mcg daily but tsh quite high > will need to verify she's taking it before further recs

## 2016-09-22 NOTE — Assessment & Plan Note (Signed)
-   quit smoking 2000 PFT's  04/06/10  FEV1 1.10 (48 % ) ratio 55  p 0 % improvement from saba p ?  prior to study with DLCO  55 % corrects to 133  % for alv volume    - 09/21/2016  After extensive coaching HFA effectiveness =    75% so restart symbicort 160 2bid   Worse off maint rx on just prn saba so needs trial back to laba/ics and then maybe add lama in respimat form if not better noting she did not previously tolerate the dpi  I had an extended discussion with the patient reviewing all relevant studies completed to date and  lasting 15 to 20 minutes of a 25 minute visit for pt not previously known to me   Each maintenance medication was reviewed in detail including most importantly the difference between maintenance and prns and under what circumstances the prns are to be triggered using an action plan format that is not reflected in the computer generated alphabetically organized AVS.    Please see AVS for specific instructions unique to this visit that I personally wrote and verbalized to the the pt in detail and then reviewed with pt  by my nurse highlighting any  changes in therapy recommended at today's visit to their plan of care.    [name]

## 2016-09-22 NOTE — Assessment & Plan Note (Signed)
Labs reviewed/ thyroid is the main concern here though bnp is intermediate   First thing we need to do is have her return with all meds in hand using a trust but verify approach to confirm accurate Medication  Reconciliation The principal here is that until we are certain that the  patients are doing what we've asked, it makes no sense to ask them to do more.

## 2016-09-22 NOTE — Progress Notes (Signed)
LMTCB

## 2016-09-22 NOTE — Assessment & Plan Note (Signed)
Body mass index is 53.59 kg/m.  Lab Results  Component Value Date   TSH 21.35 (H) 09/21/2016     Contributing to gerd tendency/ doe/reviewed the need and the process to achieve and maintain neg calorie balance > defer f/u primary care including intermittently monitoring thyroid status

## 2016-09-22 NOTE — Telephone Encounter (Signed)
Notes Recorded by Tanda Rockers, MD on 09/22/2016 at 1:07 PM EST Call patient : Studies are unremarkable exept tsh too high so be sure taking the synthroid 200 ac daily and needs to be rechecked at next visit before adjusting further  ----------------------------------------------------------------- Spoke with pt. She is aware of results. Nothing further was needed.

## 2016-09-24 ENCOUNTER — Other Ambulatory Visit: Payer: Self-pay | Admitting: Internal Medicine

## 2016-09-24 NOTE — Progress Notes (Signed)
Spoke with pt and notified of results per Dr. Wert. Pt verbalized understanding and denied any questions. 

## 2016-09-26 DIAGNOSIS — B359 Dermatophytosis, unspecified: Secondary | ICD-10-CM | POA: Insufficient documentation

## 2016-09-26 NOTE — Assessment & Plan Note (Signed)
Mild to mod, for antifungal course,  to f/u any worsening symptoms or concerns

## 2016-09-26 NOTE — Assessment & Plan Note (Signed)
stable overall by history and exam, recent data reviewed with pt, and pt to continue medical treatment as before,  to f/u any worsening symptoms or concerns BP Readings from Last 3 Encounters:  09/21/16 124/80  09/21/16 124/80  09/03/16 117/69

## 2016-09-26 NOTE — Assessment & Plan Note (Signed)
stable overall by history and exam, recent data reviewed with pt, and pt to continue medical treatment as before,  to f/u any worsening symptoms or concerns @LASTSAO2(3)@  

## 2016-09-26 NOTE — Assessment & Plan Note (Signed)
Right axilla cleared, d/w pt, reassured, no further tx needed

## 2016-10-03 ENCOUNTER — Other Ambulatory Visit: Payer: Self-pay | Admitting: Cardiology

## 2016-10-04 NOTE — Telephone Encounter (Signed)
Rx(s) sent to pharmacy electronically.  

## 2016-10-05 ENCOUNTER — Encounter: Payer: Self-pay | Admitting: Adult Health

## 2016-10-05 ENCOUNTER — Ambulatory Visit (INDEPENDENT_AMBULATORY_CARE_PROVIDER_SITE_OTHER): Payer: Medicare Other | Admitting: Adult Health

## 2016-10-05 VITALS — BP 118/70 | HR 93 | Ht 65.5 in | Wt 328.0 lb

## 2016-10-05 DIAGNOSIS — J449 Chronic obstructive pulmonary disease, unspecified: Secondary | ICD-10-CM | POA: Diagnosis not present

## 2016-10-05 DIAGNOSIS — G4733 Obstructive sleep apnea (adult) (pediatric): Secondary | ICD-10-CM | POA: Diagnosis not present

## 2016-10-05 DIAGNOSIS — J9611 Chronic respiratory failure with hypoxia: Secondary | ICD-10-CM

## 2016-10-05 MED ORDER — BUDESONIDE-FORMOTEROL FUMARATE 160-4.5 MCG/ACT IN AERO: 2.0000 | INHALATION_SPRAY | Freq: Two times a day (BID) | RESPIRATORY_TRACT | 0 refills | Status: DC

## 2016-10-05 MED ORDER — BUDESONIDE-FORMOTEROL FUMARATE 160-4.5 MCG/ACT IN AERO: 2.0000 | INHALATION_SPRAY | Freq: Two times a day (BID) | RESPIRATORY_TRACT | 3 refills | Status: DC

## 2016-10-05 NOTE — Patient Instructions (Addendum)
Follow med calendar closely and bring to next office visit.  Follow with Primary Doctor in 1 month for thyroid level .  Continue on Symbicort 2 puffs Twice daily  . Rinse after use.  Continue on CPAP At bedtime   Would use extra lasix 69m daily for leg swelling As needed   Begin Oxygen 2l/m with activity .  Evaluate for POC .  Follow up in 4 weeks with Dr. WMelvyn Novas And As needed   Please contact office for sooner follow up if symptoms do not improve or worsen or seek emergency care

## 2016-10-05 NOTE — Assessment & Plan Note (Signed)
Doing better on Symbicort .   Plan  Patient Instructions  Follow med calendar closely and bring to next office visit.  Follow with Primary Doctor in 1 month for thyroid level .  Continue on Symbicort 2 puffs Twice daily  . Rinse after use.  Continue on CPAP At bedtime   Would use extra lasix 34m daily for leg swelling As needed   Begin Oxygen 2l/m with activity .  Evaluate for POC .  Follow up in 4 weeks with [name] [name] And As needed   Please contact office for sooner follow up if symptoms do not improve or worsen or seek emergency care

## 2016-10-05 NOTE — Assessment & Plan Note (Signed)
Cont on CPAP At bedtime  

## 2016-10-05 NOTE — Assessment & Plan Note (Signed)
Restart O2 2lm /wiht act  eval for POC

## 2016-10-05 NOTE — Progress Notes (Signed)
@Patient  ID: Rich Number, female    DOB: 03/19/1946, 71 y.o.   MRN: [id number]  Chief Complaint  Patient presents with  . Follow-up    COPD     Referring provider: Biagio Borg, MD  HPI: 71 yo female  morbidly obese female quit smoking in 2000 for FU of copd & OSA on 10 cm.  PMH -sleep apnea, hypertension,breast cancer with left lumpectomy and hypothyroidism , dCHF , PFO    TEST :   Duplex neg DVT, CT angio neg PE, echo >> nml LV fn, mod LVH.   PFTs showed moderate airway obstruction FEV1 48%, no BD response, mild restriction, DLCO corrects for alveolar volume c/w obesity  PSG 03/30/10 surprisingly showed AHI 0.7/h & RDI 9/h, 36 RERAs over 249 mins TST, lowest desatn 92% on 2 L [place]  July 2011 -ONO showed significant desaturation for > 4h  ECHO TEE >EF ok, PFO +bubble   10/05/2016 Follow up ; COPD GOLD III/ OSA CPAP At bedtime   Pt returns for 2 week follow up and med review  Last visit was started on Symbicort and Feels Symbicort really helped .  Decreased cough and wheezing .   Labs showed thyroid was hypoactive with TSH ~21. She believes she had missed some doses and advised on compliance. We discussed follow up with PCP .   We reviewed all his meds and organized them into a med calendar with pt education , Overall appears to be taking them correctly. She is taking aleve and diclofenac which she should avoidif possible with Elqiuis.   Remains on CPAP At bedtime  . Doing well.   She has been on O2 in past , not used for a while  .Walking O2 sats 87% . We discussed adding O2 with activity . She would like a POC     Allergies  Allergen Reactions  . Ciprofloxacin Other (See Comments)    foliculitis  . Cephalexin Other (See Comments)    Exfoliative dermatitis reaction  . Codeine     REACTION: unknown - pt. states unaware of having reaction to codeine  . Dilaudid [Hydromorphone] Other (See Comments)    Other Reaction: Oversedation  . Lisinopril     REACTION: cough    . Penicillins     rash  . Cleocin [Clindamycin Hcl] Rash    rash  . Doxycycline Rash    Immunization History  Administered Date(s) Administered  . Influenza Split 06/08/2011, 09/21/2011, 10/04/2012  . Influenza Whole 07/28/2009  . Influenza,inj,Quad PF,36+ Mos 05/14/2014, 06/11/2015, 06/11/2016  . Pneumococcal Conjugate-13 07/25/2013  . Pneumococcal Polysaccharide-23 07/28/2009, 06/18/2011    Past Medical History:  Diagnosis Date  . Anemia   . Asthma 06/13/2011   hx of  . Atrial fibrillation ([place])   . Atrial flutter ([place])   . Breast cancer ([place]) dx'd 2005   No BP check or stick in Left arm  . CAD (coronary artery disease)    a. Nonobstructive CAD 2007 (EF 75%.  RCA  proximal 75% tubular, 25% prox LAD, 25% d1, 50% D2) at [facility]. b. NSTEMI 01/2010 in setting of respiratory failure, medical approach (not a good candidate for noninvasive eval)  . Cellulitis    hx of cellulitis in left leg  . Cervical radiculopathy 06/13/2011  . COPD (chronic obstructive pulmonary disease) ([place])    on O2  . Depression 06/13/2011  . Diverticulosis   . Esophageal dysmotility   . Gout 06/13/2011  . History of Clostridium difficile early  dec 2015   no current diarrhea  . History of home oxygen therapy    2 liters per nasal cannula all the time  . Hx of dysfunctional uterine bleeding    last bleeding jan 2016, worked up for with d and c x 2 no cause found  . Hyperlipidemia   . Hypertension   . Hypothyroidism   . Impaired glucose tolerance 11/01/2012  . Neuropathy ([place]) 06/13/2011  . Obesity hypoventilation syndrome ([place]) 06/13/2011  . OSA on CPAP   . PFO (patent foramen ovale)   . Risk for falls   . Syncope and collapse    Bradycardia with pauses. S/P pacemaker  . Tubular adenoma of colon     Tobacco History: History  Smoking Status  . Former Smoker  . Packs/day: 1.00  . Years: 20.00  . Types: Cigarettes  . Quit date: 09/20/1998  Smokeless Tobacco  . Never Used    Comment: 1ppd x 20  years   Counseling given: Not Answered   Outpatient Encounter Prescriptions as of 10/05/2016  Medication Sig  . allopurinol (ZYLOPRIM) 300 MG tablet TAKE 1 TABLET DAILY  . amitriptyline (ELAVIL) 100 MG tablet take 1 tablet by mouth at bedtime if needed for sleep  . budesonide-formoterol (SYMBICORT) 160-4.5 MCG/ACT inhaler Inhale 2 puffs into the lungs 2 (two) times daily.  . Cholecalciferol (VITAMIN D) 2000 UNITS tablet Take 2,000 Units by mouth at bedtime.  . clonazePAM (KLONOPIN) 0.5 MG tablet Take 1 tablet (0.5 mg total) by mouth 2 (two) times daily as needed for anxiety.  . diclofenac sodium (VOLTAREN) 1 % GEL Apply 4 g topically 4 (four) times daily as needed.  . DULoxetine (CYMBALTA) 30 MG capsule Take 30 mg by mouth daily.  [name] ELIQUIS 5 MG TABS tablet TAKE 1 TABLET TWICE A DAY  . ferrous sulfate 325 (65 FE) MG tablet Take 325 mg by mouth daily with breakfast.  . furosemide (LASIX) 40 MG tablet TAKE 1 TABLET EVERY MORNING AND 1 TABLET EVERY OTHER NIGHT  . hydrOXYzine (ATARAX/VISTARIL) 25 MG tablet Take 1 tablet (25 mg total) by mouth every 6 (six) hours as needed for itching.  [name] ketoconazole (NIZORAL) 2 % cream Apply 1 application topically daily.  [name] ketoconazole (NIZORAL) 200 MG tablet Take 1 tablet (200 mg total) by mouth daily.  [name] levothyroxine (SYNTHROID, LEVOTHROID) 200 MCG tablet TAKE 1 TABLET DAILY  . metoprolol tartrate (LOPRESSOR) 25 MG tablet Take 25 mg by mouth 2 (two) times daily. 1 every am and 1/2 at night  . Omega-3 Fatty Acids (FISH OIL PO) Take 1 capsule by mouth daily.   [name] omeprazole (PRILOSEC) 40 MG capsule Take 1 capsule (40 mg total) by mouth daily.  . potassium chloride SA (K-DUR,KLOR-CON) 20 MEQ tablet Take 1 tablet (20 mEq total) by mouth 2 (two) times daily. Please schedule appointment for refills.  . pravastatin (PRAVACHOL) 40 MG tablet Take 1 tablet (40 mg total) by mouth daily. <PLEASE MAKE APPOINTMENT FOR REFILLS>  . PROAIR HFA 108 (90 Base) MCG/ACT inhaler  Inhale 2 puffs into the lungs every 4 (four) hours as needed.  . traMADol (ULTRAM) 50 MG tablet Take 1 tablet (50 mg total) by mouth every 8 (eight) hours as needed.  . vitamin C (ASCORBIC ACID) 500 MG tablet Take 500 mg by mouth daily.   . [DISCONTINUED] budesonide-formoterol (SYMBICORT) 160-4.5 MCG/ACT inhaler Inhale 2 puffs into the lungs 2 (two) times daily.  . [DISCONTINUED] budesonide-formoterol (SYMBICORT) 160-4.5 MCG/ACT inhaler Inhale 2 puffs into  the lungs 2 (two) times daily.   No facility-administered encounter medications on file as of 10/05/2016.      Review of Systems  Constitutional:   No  weight loss, night sweats,  Fevers, chills, fatigue, or  lassitude.  HEENT:   No headaches,  Difficulty swallowing,  Tooth/dental problems, or  Sore throat,                No sneezing, itching, ear ache, nasal congestion, post nasal drip,   CV:  No chest pain,  Orthopnea, PND, swelling in lower extremities, anasarca, dizziness, palpitations, syncope.   GI  No heartburn, indigestion, abdominal pain, nausea, vomiting, diarrhea, change in bowel habits, loss of appetite, bloody stools.   Resp: No shortness of breath with exertion or at rest.  No excess mucus, no productive cough,  No non-productive cough,  No coughing up of blood.  No change in color of mucus.  No wheezing.  No chest wall deformity  Skin: no rash or lesions.  GU: no dysuria, change in color of urine, no urgency or frequency.  No flank pain, no hematuria   MS:  No joint pain or swelling.  No decreased range of motion.  No back pain.    Physical Exam  BP 118/70   Pulse 93   Ht 5' 5.5" (1.664 m)   Wt (!) 328 lb (148.8 kg)   SpO2 95%   BMI 53.75 kg/m   GEN: A/Ox3; pleasant , NAD, well nourished    HEENT:  [place]/AT,  EACs-clear, TMs-wnl, NOSE-clear, THROAT-clear, no lesions, no postnasal drip or exudate noted.   NECK:  Supple w/ fair ROM; no JVD; normal carotid impulses w/o bruits; no thyromegaly or nodules palpated;  no lymphadenopathy.    RESP  Clear  P & A; w/o, wheezes/ rales/ or rhonchi. no accessory muscle use, no dullness to percussion  CARD:  RRR, no m/r/g, no peripheral edema, pulses intact, no cyanosis or clubbing.  GI:   Soft & nt; nml bowel sounds; no organomegaly or masses detected.   Musco: Warm bil, no deformities or joint swelling noted.   Neuro: alert, no focal deficits noted.    Skin: Warm, no lesions or rashes  Psych:  No change in mood or affect. No depression or anxiety.  No memory loss.  Lab Results:  CBC    Component Value Date/Time   WBC 4.3 09/21/2016 1425   RBC 4.49 09/21/2016 1425   HGB 13.2 09/21/2016 1425   HGB 13.3 06/08/2016 0823   HCT 40.7 09/21/2016 1425   HCT 42.4 06/08/2016 0823   PLT 172.0 09/21/2016 1425   PLT 154 06/08/2016 0823   MCV 90.6 09/21/2016 1425   MCV 91.8 06/08/2016 0823   MCH 29.3 09/02/2016 1926   MCHC 32.3 09/21/2016 1425   RDW 15.1 09/21/2016 1425   RDW 15.4 (H) 06/08/2016 0823   LYMPHSABS 1.3 09/21/2016 1425   LYMPHSABS 1.7 06/08/2016 0823   MONOABS 0.2 09/21/2016 1425   MONOABS 0.4 06/08/2016 0823   EOSABS 0.2 09/21/2016 1425   EOSABS 0.2 06/08/2016 0823   BASOSABS 0.0 09/21/2016 1425   BASOSABS 0.1 06/08/2016 0823    BMET    Component Value Date/Time   NA 139 09/02/2016 1926   NA 146 (H) 06/08/2016 0824   K 3.6 09/02/2016 1926   K 3.7 06/08/2016 0824   CL 100 (L) 09/02/2016 1926   CL 97 (L) 12/28/2012 0930   CO2 31 09/02/2016 1926   CO2 30 (H) 06/08/2016  0824   GLUCOSE 89 09/02/2016 1926   GLUCOSE 88 06/08/2016 0824   GLUCOSE 111 (H) 12/28/2012 0930   BUN 8 09/02/2016 1926   BUN 8.1 06/08/2016 0824   CREATININE 0.95 09/02/2016 1926   CREATININE 1.0 06/08/2016 0824   CALCIUM 9.2 09/02/2016 1926   CALCIUM 9.0 06/08/2016 0824   GFRNONAA 59 (L) 09/02/2016 1926   GFRAA >60 09/02/2016 1926    BNP    Component Value Date/Time   BNP 91.7 04/10/2016 1750    ProBNP    Component Value Date/Time   PROBNP 206.0  (H) 09/21/2016 1425    Imaging: No results found.   Assessment & Plan:   COPD GOLD III Doing better on Symbicort .   Plan  Patient Instructions  Follow med calendar closely and bring to next office visit.  Follow with Primary Doctor in 1 month for thyroid level .  Continue on Symbicort 2 puffs Twice daily  . Rinse after use.  Continue on CPAP At bedtime   Would use extra lasix 34m daily for leg swelling As needed   Begin Oxygen 2l/m with activity .  Evaluate for POC .  Follow up in 4 weeks with Dr. WMelvyn Novas And As needed   Please contact office for sooner follow up if symptoms do not improve or worsen or seek emergency care      OSA (obstructive sleep apnea) Cont on CPAP At bedtime     Chronic respiratory failure (HCC) Restart O2 2lm /Bridgette Habermannact  eval for POC      TParker Hannifin NP 10/05/2016

## 2016-10-06 NOTE — Progress Notes (Signed)
Chart and office note reviewed in detail  > agree with a/p as outlined    

## 2016-10-08 ENCOUNTER — Telehealth: Payer: Self-pay | Admitting: Internal Medicine

## 2016-10-08 NOTE — Telephone Encounter (Signed)
Spoke with [name] [name]  She is calling b/c they need qualifying o2 sats from ov with TP on 09/21/16  The sat on RA while walking was documented, but they need the RA at rest sat and also sat while walking on o2  Denise, do you recall what the numbers were?  If not pt will need to come back in  Please advise thanks!

## 2016-10-12 NOTE — Addendum Note (Signed)
Addended by: Doroteo Glassman D on: 10/12/2016 01:32 PM   Modules accepted: Orders

## 2016-10-12 NOTE — Telephone Encounter (Signed)
Denise please advise if you can remember these numbers on the other part of the walk.  thanks

## 2016-10-12 NOTE — Telephone Encounter (Signed)
Spoke with [name] [name] at [facility] and notified that the qualifying sats are now documented  She is able to see these in Epic and so nothing further needed

## 2016-10-12 NOTE — Telephone Encounter (Signed)
I re loaded all the sats under the care coordination note now.

## 2016-10-14 NOTE — Progress Notes (Signed)
HPI: fu atrial flutter. Also with hx of CAD, HTN, HL, OSA, hypothyroidism, COPD on O2, L breast CA. LHC at [facility] 10/2005: pRCA 75%, LM < 25%, pLAD 25%, D1 25%, D2 50%. Carotid Dopplers June 2013 showed no significant stenosis. She suffered a NSTEMI in 01/2010 in the setting of SIRS from leg cellulitis. This was felt to be demand ischemia. Med Rx was pursued (felt to be a poor candidate for noninvasive imaging). Patient had atrial flutter with RVR 6/14. She was felt to be a poor candidate for atrial flutter ablation. TEE guided cardioversion was performed. She had restoration of NSR. TEE 03/15/13: EF 55-60%, no LA clot, positive PFO, mild to moderate TR. Patient admitted in December of 2014 following a syncopal episode. She had an implantable loop placed which demonstrated symptomatic bradycardia with pauses and ultimately had a pacemaker placed. Echocardiogram July 2017 showed normal LV systolic function, mild to moderate aortic insufficiency, moderate mitral regurgitation, moderate left atrial enlargement, moderate tricuspid regurgitation and severely elevated pulmonary pressures. Since last seen, patient notes increased dyspnea on exertion. No orthopnea or PND. Increased pedal edema. No chest pain or syncope. She was seen by pulmonary and her Lasix was recently increased to 40 mg twice a day. She was also placed on Symbicort and her thyroid and Prilosec medications were adjusted.  Current Outpatient Prescriptions  Medication Sig Dispense Refill  . acetaminophen (TYLENOL) 325 MG tablet Take as needed per bottle    . allopurinol (ZYLOPRIM) 300 MG tablet TAKE 1 TABLET DAILY 90 tablet 1  . amitriptyline (ELAVIL) 100 MG tablet Take 1 tablet (100 mg total) by mouth at bedtime. 90 tablet 1  . budesonide-formoterol (SYMBICORT) 160-4.5 MCG/ACT inhaler Inhale 2 puffs into the lungs 2 (two) times daily. 3 Inhaler 3  . Cholecalciferol (VITAMIN D) 2000 UNITS tablet Take 2,000 Units by mouth at bedtime.    .  clonazePAM (KLONOPIN) 0.5 MG tablet Take 1 tablet (0.5 mg total) by mouth 2 (two) times daily as needed for anxiety. 60 tablet 2  . DULoxetine (CYMBALTA) 30 MG capsule Take 30 mg by mouth daily.    [name] ELIQUIS 5 MG TABS tablet TAKE 1 TABLET TWICE A DAY 180 tablet 3  . famotidine (PEPCID) 20 MG tablet Take 20 mg by mouth at bedtime.    . furosemide (LASIX) 40 MG tablet TAKE 1 TABLET EVERY MORNING AND 1 TABLET EVERY OTHER NIGHT 180 tablet 2  . levothyroxine (SYNTHROID, LEVOTHROID) 200 MCG tablet TAKE 1 TABLET DAILY 90 tablet 3  . metoprolol tartrate (LOPRESSOR) 25 MG tablet Take 25 mg by mouth 2 (two) times daily. 1 every am and 1/2 at night    . omeprazole (PRILOSEC) 40 MG capsule Take 1 capsule (40 mg total) by mouth daily. 90 capsule 3  . potassium chloride SA (K-DUR,KLOR-CON) 20 MEQ tablet Take 1 tablet (20 mEq total) by mouth 2 (two) times daily. Please schedule appointment for refills. 60 tablet 1  . pravastatin (PRAVACHOL) 40 MG tablet Take 1 tablet (40 mg total) by mouth daily. <PLEASE MAKE APPOINTMENT FOR REFILLS> 30 tablet 0  . PROAIR HFA 108 (90 Base) MCG/ACT inhaler Inhale 2 puffs into the lungs every 4 (four) hours as needed.    . traMADol (ULTRAM) 50 MG tablet Take 1 tablet (50 mg total) by mouth every 8 (eight) hours as needed. 90 tablet 2  . UNABLE TO FIND Med Name:Cpap at night    . vitamin C (ASCORBIC ACID) 500 MG tablet Take  500 mg by mouth daily.      No current facility-administered medications for this visit.      Past Medical History:  Diagnosis Date  . Anemia   . Asthma 06/13/2011   hx of  . Atrial fibrillation ([place])   . Atrial flutter ([place])   . Breast cancer ([place]) dx'd 2005   No BP check or stick in Left arm  . CAD (coronary artery disease)    a. Nonobstructive CAD 2007 (EF 75%.  RCA  proximal 75% tubular, 25% prox LAD, 25% d1, 50% D2) at [facility]. b. NSTEMI 01/2010 in setting of respiratory failure, medical approach (not a good candidate for noninvasive eval)  .  Cellulitis    hx of cellulitis in left leg  . Cervical radiculopathy 06/13/2011  . COPD (chronic obstructive pulmonary disease) ([place])    on O2  . Depression 06/13/2011  . Diverticulosis   . Esophageal dysmotility   . Gout 06/13/2011  . History of Clostridium difficile early dec 2015   no current diarrhea  . History of home oxygen therapy    2 liters per nasal cannula all the time  . Hx of dysfunctional uterine bleeding    last bleeding jan 2016, worked up for with d and c x 2 no cause found  . Hyperlipidemia   . Hypertension   . Hypothyroidism   . Impaired glucose tolerance 11/01/2012  . Neuropathy ([place]) 06/13/2011  . Obesity hypoventilation syndrome ([place]) 06/13/2011  . OSA on CPAP   . PFO (patent foramen ovale)   . Risk for falls   . Syncope and collapse    Bradycardia with pauses. S/P pacemaker  . Tubular adenoma of colon     Past Surgical History:  Procedure Laterality Date  . BREAST LUMPECTOMY Left   . CARDIOVERSION N/A 03/15/2013   Procedure: CARDIOVERSION;  Surgeon: Thayer Headings, MD;  Location: [facility] ENDOSCOPY;  Service: Cardiovascular;  Laterality: N/A;  . CERVICAL BIOPSY  June 2013   at [facility] for Heavy  Bleeding  . [place] SURGERY  2004 or 2005   have cadaver bones,, screws, and plates c 5 to c 6  . COLONOSCOPY    . COLONOSCOPY WITH PROPOFOL N/A 10/22/2014   Procedure: COLONOSCOPY WITH PROPOFOL;  Surgeon: Jerene Bears, MD;  Location: [facility] ENDOSCOPY;  Service: Gastroenterology;  Laterality: N/A;  . DILATION AND CURETTAGE OF UTERUS    . LAPAROSCOPIC APPENDECTOMY  03/29/2012   Procedure: APPENDECTOMY LAPAROSCOPIC;  Surgeon: Adin Hector, MD;  Location: [facility] ORS;  Service: General;  Laterality: N/A;  . LOOP RECORDER EXPLANT  08/31/2013   Procedure: LOOP RECORDER EXPLANT;  Surgeon: Coralyn Mark, MD;  Location: [facility] CATH LAB;  Service: Cardiovascular;;  . LOOP RECORDER IMPLANT  08-28-2013; 08-31-2013   MDT LinQ implanted by Dr Rayann Heman for syncope; explanted 08-31-2013 after  sinus pauses identified  . LOOP RECORDER IMPLANT N/A 08/28/2013   Procedure: LOOP RECORDER IMPLANT;  Surgeon: Coralyn Mark, MD;  Location: [place] CATH LAB;  Service: Cardiovascular;  Laterality: N/A;  . OVARIAN CYST REMOVAL    . PACEMAKER INSERTION  08-31-2013   Biotronik Evera dual chamber pacemaker placed for neurocardiogenic syncope and sinus pauses by Dr Rayann Heman  . PERMANENT PACEMAKER INSERTION N/A 08/31/2013   Procedure: PERMANENT PACEMAKER INSERTION;  Surgeon: Coralyn Mark, MD;  Location: [place] CATH LAB;  Service: Cardiovascular;  Laterality: N/A;  . TEE WITHOUT CARDIOVERSION N/A 03/15/2013   Procedure: TRANSESOPHAGEAL ECHOCARDIOGRAM (TEE);  Surgeon: Thayer Headings, MD;  Location: [facility] ENDOSCOPY;  Service: Cardiovascular;  Laterality: N/A;    Social History   Social History  . Marital status: Divorced    Spouse name: N/A  . Number of children: 2  . Years of education: N/A   Occupational History  . Retired    Social History Main Topics  . Smoking status: Former Smoker    Packs/day: 1.00    Years: 20.00    Types: Cigarettes    Quit date: 09/20/1998  . Smokeless tobacco: Never Used     Comment: 1ppd x 20 years  . Alcohol use 0.6 oz/week    1 Glasses of wine per week     Comment: rare  . Drug use: No  . Sexual activity: Not Currently    Birth control/ protection: Post-menopausal   Other Topics Concern  . Not on file   Social History Narrative   Lives alone in an apartment.    Family History  Problem Relation Age of Onset  . Uterine cancer Mother   . Heart disease Mother   . Breast cancer Sister 22  . Ovarian cancer Sister   . Breast cancer Cousin 94  . Diabetes Brother   . Breast cancer Maternal Aunt   . Ovarian cancer Sister     ROS: no fevers or chills, productive cough, hemoptysis, dysphasia, odynophagia, melena, hematochezia, dysuria, hematuria, rash, seizure activity, orthopnea, PND, claudication. Remaining systems are negative.  Physical Exam: Well-developed  morbidly obese in no acute distress.  Skin is warm and dry.  HEENT is normal.  Neck is supple.  Chest is clear to auscultation with normal expansion.  Cardiovascular exam is regular rate and rhythm.  Abdominal exam nontender or distended. No masses palpated. Extremities show 1+ ankle edema. neuro grossly intact  ECG-Sinus tachycardia at a rate of 102. Normal axis. No ST changes.  A/P  1 Coronary artery disease-continue statin. No aspirin given need for anticoagulation.   2 hyperlipidemia-continue statin.  3 hypertension-blood pressure controlled. Continue present medications.   4 history of atrial flutter-patient remains in sinus rhythm. Continue beta blocker and apixaban.  5 pacemaker-followed by electrophysiology.  6 congestive heart failure-patient describes increasing dyspnea on exertion. Her Lasix was recently increased and her pulmonary medications adjusted. I will plan to repeat her echocardiogram to reassess LV function, aortic insufficiency and mitral regurgitation. Check potassium, renal function and BNP. I will see her back in 4 weeks to see if the increased dose of Lasix has helped further.  7 morbid obesity-patient counseled on weight loss and she continues to pursue this.  8 history of aortic insufficiency and mitral regurgitation-repeat echocardiogram given worsening symptoms.    Kirk Ruths, MD

## 2016-10-19 ENCOUNTER — Other Ambulatory Visit: Payer: Self-pay | Admitting: *Deleted

## 2016-10-19 MED ORDER — AMITRIPTYLINE HCL 100 MG PO TABS: 100.0000 mg | ORAL_TABLET | Freq: Every day | ORAL | 0 refills | Status: DC

## 2016-10-19 MED ORDER — AMITRIPTYLINE HCL 100 MG PO TABS: 100.0000 mg | ORAL_TABLET | Freq: Every day | ORAL | 1 refills | Status: DC

## 2016-10-19 NOTE — Telephone Encounter (Signed)
Rec'd call pt requesting refill on her amitriptyline. Needing a 30 day sent to local, then 90 day to [facility] script. Sent to both pharmacy's...Johny Chess

## 2016-10-22 ENCOUNTER — Ambulatory Visit (INDEPENDENT_AMBULATORY_CARE_PROVIDER_SITE_OTHER): Payer: Medicare Other | Admitting: Cardiology

## 2016-10-22 ENCOUNTER — Encounter: Payer: Self-pay | Admitting: Cardiology

## 2016-10-22 VITALS — BP 132/64 | HR 102 | Ht 65.0 in | Wt 323.0 lb

## 2016-10-22 DIAGNOSIS — I2510 Atherosclerotic heart disease of native coronary artery without angina pectoris: Secondary | ICD-10-CM

## 2016-10-22 DIAGNOSIS — I4892 Unspecified atrial flutter: Secondary | ICD-10-CM | POA: Diagnosis not present

## 2016-10-22 DIAGNOSIS — I509 Heart failure, unspecified: Secondary | ICD-10-CM

## 2016-10-22 LAB — BASIC METABOLIC PANEL
BUN: 10 mg/dL (ref 7–25)
CHLORIDE: 101 mmol/L (ref 98–110)
CO2: 33 mmol/L — ABNORMAL HIGH (ref 20–31)
Calcium: 9.3 mg/dL (ref 8.6–10.4)
Creat: 0.89 mg/dL (ref 0.60–0.93)
Glucose, Bld: 92 mg/dL (ref 65–99)
POTASSIUM: 4.2 mmol/L (ref 3.5–5.3)
SODIUM: 143 mmol/L (ref 135–146)

## 2016-10-22 NOTE — Patient Instructions (Signed)
Medication Instructions:   NO CHANGE  Labwork:  Your physician recommends that you HAVE LAB WORK TODAY  Testing/Procedures:  Your physician has requested that you have an echocardiogram. Echocardiography is a painless test that uses sound waves to create images of your heart. It provides your doctor with information about the size and shape of your heart and how well your heart's chambers and valves are working. This procedure takes approximately one hour. There are no restrictions for this procedure.    Follow-Up:  Your physician recommends that you schedule a follow-up appointment in: [place]

## 2016-10-23 LAB — BRAIN NATRIURETIC PEPTIDE: BRAIN NATRIURETIC PEPTIDE: 245.2 pg/mL — AB (ref <100)

## 2016-10-25 ENCOUNTER — Telehealth: Payer: Self-pay | Admitting: *Deleted

## 2016-10-25 DIAGNOSIS — R7989 Other specified abnormal findings of blood chemistry: Secondary | ICD-10-CM

## 2016-10-25 MED ORDER — FUROSEMIDE 40 MG PO TABS: 80.0000 mg | ORAL_TABLET | Freq: Two times a day (BID) | ORAL | 3 refills | Status: DC

## 2016-10-25 NOTE — Telephone Encounter (Signed)
-----   Message from Lelon Perla, MD sent at 10/25/2016  7:24 AM EST ----- Change lasix to 80 mg BID; bmet one week Kirk Ruths

## 2016-10-25 NOTE — Telephone Encounter (Signed)
Spoke with pt, she voiced understanding and repeated medication change. New script sent to the pharmacy and Lab orders mailed to the pt

## 2016-11-02 ENCOUNTER — Ambulatory Visit: Payer: Medicare Other | Admitting: Internal Medicine

## 2016-11-03 ENCOUNTER — Ambulatory Visit (INDEPENDENT_AMBULATORY_CARE_PROVIDER_SITE_OTHER): Payer: Medicare Other | Admitting: Internal Medicine

## 2016-11-03 ENCOUNTER — Encounter: Payer: Self-pay | Admitting: Internal Medicine

## 2016-11-03 ENCOUNTER — Other Ambulatory Visit (INDEPENDENT_AMBULATORY_CARE_PROVIDER_SITE_OTHER): Payer: Medicare Other

## 2016-11-03 VITALS — BP 130/84 | HR 88 | Ht 65.5 in | Wt 309.0 lb

## 2016-11-03 DIAGNOSIS — I2510 Atherosclerotic heart disease of native coronary artery without angina pectoris: Secondary | ICD-10-CM | POA: Diagnosis not present

## 2016-11-03 DIAGNOSIS — J9611 Chronic respiratory failure with hypoxia: Secondary | ICD-10-CM

## 2016-11-03 DIAGNOSIS — R0609 Other forms of dyspnea: Secondary | ICD-10-CM

## 2016-11-03 DIAGNOSIS — J9612 Chronic respiratory failure with hypercapnia: Secondary | ICD-10-CM | POA: Diagnosis not present

## 2016-11-03 DIAGNOSIS — J449 Chronic obstructive pulmonary disease, unspecified: Secondary | ICD-10-CM | POA: Diagnosis not present

## 2016-11-03 LAB — BASIC METABOLIC PANEL
BUN: 14 mg/dL (ref 6–23)
CALCIUM: 9.5 mg/dL (ref 8.4–10.5)
CO2: 35 mEq/L — ABNORMAL HIGH (ref 19–32)
Chloride: 98 mEq/L (ref 96–112)
Creatinine, Ser: 0.92 mg/dL (ref 0.40–1.20)
GFR: 77.54 mL/min (ref >60.00)
Glucose, Bld: 95 mg/dL (ref 70–99)
POTASSIUM: 3.9 meq/L (ref 3.5–5.1)
SODIUM: 142 meq/L (ref 135–145)

## 2016-11-03 LAB — TSH: TSH: 0.84 u[IU]/mL (ref 0.35–4.50)

## 2016-11-03 NOTE — Assessment & Plan Note (Signed)
Multifactorial copd/chf/obesity but much improved on present rx > no changes needed

## 2016-11-03 NOTE — Assessment & Plan Note (Addendum)
Body mass index is 50.64   trending down p diuresis  Lab Results  Component Value Date   TSH 0.84 11/03/2016     Contributing to gerd risk/ doe/reviewed the need and the process to achieve and maintain neg calorie balance > defer f/u primary care including intermittently monitoring thyroid status

## 2016-11-03 NOTE — Assessment & Plan Note (Signed)
-   quit smoking 2000 PFT's  04/06/10  FEV1 1.10 (48 % ) ratio 55  p 0 % improvement from saba p ?  prior to study with DLCO  55 % corrects to 133  % for alv volume   - 09/21/2016  After extensive coaching HFA effectiveness =    75% so restart symbicort - Allergy profile   09/21/16 >  Eos 0. 2/  IgE  62 neg RAST  - 11/03/2016  After extensive coaching HFA effectiveness =    90% from baseline 75%   Much better on symb 160 2bid and rx for GERD so no need to change rx for now   I had an extended discussion with the patient reviewing all relevant studies completed to date and  lasting 15 to 20 minutes of a 25 minute visit    Each maintenance medication was reviewed in detail including most importantly the difference between maintenance and prns and under what circumstances the prns are to be triggered using an action plan format that is not reflected in the computer generated alphabetically organized AVS but trather by a customized med calendar that reflects the AVS meds with confirmed 100% correlation.   In addition, Please see AVS for unique instructions that I personally wrote and verbalized to the the pt in detail and then reviewed with pt  by my nurse highlighting any  changes in therapy recommended at today's visit to their plan of care.

## 2016-11-03 NOTE — Progress Notes (Signed)
Subjective:    Patient ID: [name] [name], female    DOB: 15-Mar-1946, .   MRN: [id number]  HPI PCP - [name] [name]   71 yo  morbidly obese female quit smoking in 2000 for FU of copd & OSA on 10 cm.  PMH -sleep apnea, hypertension,breast cancer with left lumpectomy and hypothyroidism , dCHF , PFO    TEST :   Duplex neg DVT, CT angio neg PE, echo >> nml LV fn, mod LVH.   PFTs showed moderate airway obstruction FEV1 48%, no BD response, mild restriction, DLCO corrects for alveolar volume c/w obesity  PSG 03/30/10 surprisingly showed AHI 0.7/h & RDI 9/h, 36 RERAs over 249 mins TST, lowest desatn 92% on 2 L [place]  July 2011 -ONO showed significant desaturation for > 4h  ECHO TEE 03/15/13>EF ok, PFO +bubble  Echo 04/12/16 - Left ventricle: The cavity size was normal. There was mild   concentric hypertrophy. Systolic function was normal. The   estimated ejection fraction was in the range of 55% to 60%.   Doppler parameters are consistent with both elevated ventricular   end-diastolic filling pressure and elevated left atrial filling   pressure. - Aortic valve: There was mild to moderate regurgitation. - Mitral valve: Eccentric posteriorly directed MR. Severely   calcified annulus. There was moderate regurgitation. Valve area   by pressure half-time: 2.27 cm^2. - Left atrium: The atrium was moderately dilated. - Atrial septum: No defect or patent foramen ovale was identified. - Tricuspid valve: There was moderate regurgitation. - Pulmonary arteries: PA peak pressure: 67 mm Hg (S).    06/11/2016 NP  Follow up : OSA /OHS  Pt returns for 4 month follow up .  She says she  Is doing well on CPAP .  Wearing CPAP At bedtime  , wears for 8hr each night.  Feels rested. Says she is doing very well, feels best in long time.  Download shows excellent compliance with average usage at 8.5 hours. She is on a set pressure at 10 cm of H2O. AHI 0.3. Leaks. Minimum. She is now with [employer] . Getting better customer  service.  She denies any chest pain, orthopnea, PND, or increased leg swelling Has some moderate Moderate COPD , previously on Symbicort and spriiva did not seem to make any difference. Does not like to take a lot of meds esp if they are not helping.  Uses ventolin on occasion . No flare of cough, wheeizng or dyspnea.  rec Continue on CPAP At bedtime  -keep up good work  Work on weight loss.  Do not drive if sleepy.  Flu shot today  Activity as tolerated.  Ventolin As needed  For wheezing/shortness of breathing .  Follow up Dr. Elsworth [name]  In  6 months  and As needed     09/21/2016 acute extended ov/[name] [name] re: COPD GOLD III/ no maint / cpap no 02  Chief Complaint  Patient presents with  . Acute Visit    Pt c/o increased SOB and congestion- this has been going on off and on for the past 3 months. She states she can not perform daily activities due to SOB. She has cough with clear sputum.  She states she has had swelling in her right leg- recently txed for cellulitis.   since flu shot more noted more doe assoc with  dry cough > no better with ventolin but hfa poor  Breathing overall  worse x 2 weeks (but not worse when seen in ER [id number]  for itchy rash)   Voice is very hoarse  Ok at hs cpap and no noct or early am resp symptoms/ excess/ purulent sputum or mucus plugs rec Plan A = Automatic = Symbicort 160 Take 2 puffs first thing in am and then another 2 puffs about 12 hours later.  Work on inhaler technique:   Plan B = Backup Only use your albuterol as a rescue medication Please remember to go to the lab  department downstairs for your tests - we will call you with the results when they are available. Try [id number]  Take 30-60 min before first meal of the day and add  Pepcid ac (famotidine) 20 mg one @  bedtime until cough is completely gone for at least a week without the need for cough suppression GERD diet    11/03/2016  f/u ov/[name] [name] re: GOLD II copd / symb 160 2bid / proair rarely needed   2lpm 24/7  Chief Complaint  Patient presents with  . Follow-up    Breathing has improved some. She has been using o2 24/7.     Can now do housework on 02 2lpm / 4 wheel rolling walker with brakes  Even on 02 MMRC3 = can't walk 100 yards even at a slow pace at a flat grade s stopping due to sob  Even on 02    No obvious day to day or daytime variability or assoc cp or chest tightness, subjective wheeze or overt sinus or hb symptoms. No unusual exp hx or h/o childhood pna/ asthma or knowledge of premature birth.  Sleeping ok without nocturnal  or early am exacerbation  of respiratory  c/o's or need for noct saba. Also denies any obvious fluctuation of symptoms with weather or environmental changes or other aggravating or alleviating factors except as outlined above   Current Medications, Allergies, Complete Past Medical History, Past Surgical History, Family History, and Social History were reviewed in [employer].  ROS  The following are not active complaints unless bolded sore throat, dysphagia, dental problems, itching, sneezing,  nasal congestion or excess/ purulent secretions, ear ache,   fever, chills, sweats, unintended wt loss, classically pleuritic or exertional cp, hemoptysis,  orthopnea pnd or leg swelling R> L resolved , presyncope, palpitations, abdominal pain, anorexia, nausea, vomiting, diarrhea  or change in bowel or bladder habits, change in stools or urine, dysuria,hematuria,  rash, arthralgias, visual complaints, headache, numbness, weakness or ataxia or problems with walking or coordination,  change in mood/affect or memory.                 Objective:   Physical Exam    11/03/2016         310   09/21/16 (!) 327 lb (148.3 kg)  07/28/16 (!) 331 lb (150.1 kg)  07/06/16 (!) 319 lb (144.7 kg)    Vital signs reviewed - Note on arrival 02 sats  97% on 2lpm        HEENT: nl dentition, turbinates bilaterally, and oropharynx. Nl external ear  canals without cough reflex   NECK :  without JVD/Nodes/TM/ nl carotid upstrokes bilaterally   LUNGS: no acc muscle use,  Nl contour chest which is clear to A and P bilaterally without cough on insp or exp maneuvers   CV:  RRR  no s3 or murmur or increase in P2, and only trace bilateral lower ext pitting edema   ABD:  soft and nontender with nl inspiratory excursion in the supine position. No  bruits or organomegaly appreciated, bowel sounds nl  MS:  Nl gait/ ext warm without deformities, calf tenderness, cyanosis or clubbing No obvious joint restrictions   SKIN: warm and dry without lesions    NEURO:  alert, approp, nl sensorium with  no motor or cerebellar deficits apparent.       I personally reviewed images and agree with radiology impression as follows:  CXR:   09/02/16 Cardiomegaly without acute disease     Labs ordered/ reviewed:      Chemistry      Component Value Date/Time   NA 142 11/03/2016 0923   NA 146 (H) 06/08/2016 0824   K 3.9 11/03/2016 0923   K 3.7 06/08/2016 0824   CL 98 11/03/2016 0923   CL 97 (L) 12/28/2012 0930   CO2 35 (H) 11/03/2016 0923   CO2 30 (H) 06/08/2016 0824   BUN 14 11/03/2016 0923   BUN 8.1 06/08/2016 0824   CREATININE 0.92 11/03/2016 0923   CREATININE 0.89 10/22/2016 1044   CREATININE 1.0 06/08/2016 0824      Component Value Date/Time   CALCIUM 9.5 11/03/2016 0923   CALCIUM 9.0 06/08/2016 0824   ALKPHOS 84 09/02/2016 1926   ALKPHOS 106 06/08/2016 0824   AST 26 09/02/2016 1926   AST 31 06/08/2016 0824   ALT 16 09/02/2016 1926   ALT 26 06/08/2016 0824   BILITOT 0.9 09/02/2016 1926   BILITOT 0.39 06/08/2016 0824               Lab Results  Component Value Date   TSH 0.84 11/03/2016

## 2016-11-03 NOTE — Patient Instructions (Addendum)
See calendar for specific medication instructions and bring it back for each and every office visit for every healthcare provider you see.  Without it,  you may not receive the best quality medical care that we feel you deserve.  You will note that the calendar groups together  your maintenance  medications that are timed at particular times of the day.  Think of this as your checklist for what your doctor has instructed you to do until your next evaluation to see what benefit  there is  to staying on a consistent group of medications intended to keep you well.  The other group at the bottom is entirely up to you to use as you see fit  for specific symptoms that may arise between visits that require you to treat them on an as needed basis.  Think of this as your action plan or "what if" list.   Separating the top medications from the bottom group is fundamental to providing you adequate care going forward.     Please remember to go to the lab department downstairs in the basement  for your tests - we will call you with the results when they are available.      Please schedule a follow up visit in 3 months but call sooner if needed

## 2016-11-03 NOTE — Progress Notes (Signed)
Spoke with pt and notified of results per Dr. Wert. Pt verbalized understanding and denied any questions. 

## 2016-11-04 ENCOUNTER — Ambulatory Visit (HOSPITAL_COMMUNITY): Payer: Medicare Other | Attending: Cardiovascular Disease

## 2016-11-04 ENCOUNTER — Other Ambulatory Visit: Payer: Self-pay

## 2016-11-04 DIAGNOSIS — I351 Nonrheumatic aortic (valve) insufficiency: Secondary | ICD-10-CM | POA: Insufficient documentation

## 2016-11-04 DIAGNOSIS — J9612 Chronic respiratory failure with hypercapnia: Secondary | ICD-10-CM

## 2016-11-04 DIAGNOSIS — J9611 Chronic respiratory failure with hypoxia: Secondary | ICD-10-CM | POA: Insufficient documentation

## 2016-11-04 DIAGNOSIS — I509 Heart failure, unspecified: Secondary | ICD-10-CM | POA: Diagnosis not present

## 2016-11-04 DIAGNOSIS — J9621 Acute and chronic respiratory failure with hypoxia: Secondary | ICD-10-CM | POA: Insufficient documentation

## 2016-11-04 NOTE — Assessment & Plan Note (Addendum)
10/05/16 Pt oxygen 87% with exertion on room air PT RA Sat at rest 95% Pt Sat on exertion with 2L Oxygen 93% - HCO3  11/03/2016  = 34   rx as of 11/03/2016 = 2 lpm 24/7

## 2016-11-05 ENCOUNTER — Ambulatory Visit (INDEPENDENT_AMBULATORY_CARE_PROVIDER_SITE_OTHER): Payer: Medicare Other | Admitting: Internal Medicine

## 2016-11-05 ENCOUNTER — Encounter: Payer: Self-pay | Admitting: Internal Medicine

## 2016-11-05 VITALS — BP 114/64 | HR 92 | Ht 65.5 in | Wt 311.4 lb

## 2016-11-05 DIAGNOSIS — I2510 Atherosclerotic heart disease of native coronary artery without angina pectoris: Secondary | ICD-10-CM | POA: Diagnosis not present

## 2016-11-05 DIAGNOSIS — Z950 Presence of cardiac pacemaker: Secondary | ICD-10-CM | POA: Diagnosis not present

## 2016-11-05 DIAGNOSIS — I480 Paroxysmal atrial fibrillation: Secondary | ICD-10-CM

## 2016-11-05 DIAGNOSIS — I10 Essential (primary) hypertension: Secondary | ICD-10-CM

## 2016-11-05 DIAGNOSIS — I495 Sick sinus syndrome: Secondary | ICD-10-CM | POA: Diagnosis not present

## 2016-11-05 LAB — CUP PACEART INCLINIC DEVICE CHECK
Brady Statistic RV Percent Paced: 0 %
Date Time Interrogation Session: 20180216140128
Implantable Lead Implant Date: 20141212
Implantable Lead Location: 753859
Implantable Lead Model: 346
Implantable Lead Model: 350
Implantable Lead Serial Number: 29535412
Lead Channel Impedance Value: 526 Ohm
Lead Channel Impedance Value: 780 Ohm
Lead Channel Pacing Threshold Amplitude: 1 V
Lead Channel Pacing Threshold Pulse Width: 0.75 ms
Lead Channel Sensing Intrinsic Amplitude: 4.1 mV
Lead Channel Setting Pacing Amplitude: 2 V
MDC IDC LEAD IMPLANT DT: 20141212
MDC IDC LEAD LOCATION: 753860
MDC IDC LEAD SERIAL: 29409274
MDC IDC MSMT LEADCHNL RA PACING THRESHOLD PULSEWIDTH: 0.4 ms
MDC IDC MSMT LEADCHNL RV PACING THRESHOLD AMPLITUDE: 1 V
MDC IDC MSMT LEADCHNL RV SENSING INTR AMPL: 10.7 mV
MDC IDC PG IMPLANT DT: 20141212
MDC IDC PG SERIAL: 68181424
MDC IDC SET LEADCHNL RV PACING AMPLITUDE: 2.6 V
MDC IDC SET LEADCHNL RV PACING PULSEWIDTH: 0.75 ms
MDC IDC STAT BRADY RA PERCENT PACED: 16 %

## 2016-11-05 NOTE — Patient Instructions (Addendum)
Medication Instructions:  Your physician recommends that you continue on your current medications as directed. Please refer to the Current Medication list given to you today.   Labwork: None Ordered   Testing/Procedures: None Ordered   Follow-Up: Your physician wants you to follow-up in: 1 year with Dr. Lovena Le.  You will receive a reminder letter in the mail two months in advance. If you don't receive a letter, please call our office to schedule the follow-up appointment.  Remote monitoring is used to monitor your Pacemaker from home. This monitoring reduces the number of office visits required to check your device to one time per year. It allows [place] to keep an eye on the functioning of your device to ensure it is working properly. You are scheduled for a device check from home on 02/07/17. You may send your transmission at any time that day. If you have a wireless device, the transmission will be sent automatically. After your physician reviews your transmission, you will receive a postcard with your next transmission date. ,   Any Other Special Instructions Will Be Listed Below (If Applicable).     If you need a refill on your cardiac medications before your next appointment, please call your pharmacy.

## 2016-11-05 NOTE — Progress Notes (Signed)
HPI [name] [name] returns today for followup. She is a pleasant obese 71 yo woman with a h/o syncope, subsequently found to be due to sinus node dysfunction, s/p PPM insertion.  She has recovered. She has increased her physical activity and has lost weight. No syncope. She has peripheral edema and is having her diuretic dose adjusted by Dr. London Sheer. Her recent echo shows moderate AI. She has diastolic dysfunction but preserved systolic dysfunction. She is on home oxygen due to moderate to severe COPD. Allergies  Allergen Reactions  . Ciprofloxacin Other (See Comments)    foliculitis  . Cephalexin Other (See Comments)    Exfoliative dermatitis reaction  . Codeine     REACTION: unknown - pt. states unaware of having reaction to codeine  . Dilaudid [Hydromorphone] Other (See Comments)    Other Reaction: Oversedation  . Lisinopril     REACTION: cough  . Penicillins     rash  . Cleocin [Clindamycin Hcl] Rash    rash  . Doxycycline Rash     Current Outpatient Prescriptions  Medication Sig Dispense Refill  . acetaminophen (TYLENOL) 325 MG tablet Take as needed per bottle    . allopurinol (ZYLOPRIM) 300 MG tablet Take 300 mg by mouth daily.    [name] amitriptyline (ELAVIL) 100 MG tablet Take 1 tablet (100 mg total) by mouth at bedtime. 90 tablet 1  . budesonide-formoterol (SYMBICORT) 160-4.5 MCG/ACT inhaler Inhale 2 puffs into the lungs 2 (two) times daily. 3 Inhaler 3  . Cholecalciferol (VITAMIN D) 2000 UNITS tablet Take 2,000 Units by mouth at bedtime.    . clonazePAM (KLONOPIN) 0.5 MG tablet Take 1 tablet (0.5 mg total) by mouth 2 (two) times daily as needed for anxiety. 60 tablet 2  . DULoxetine (CYMBALTA) 30 MG capsule Take 30 mg by mouth daily.    [name] ELIQUIS 5 MG TABS tablet TAKE 1 TABLET TWICE A DAY 180 tablet 3  . famotidine (PEPCID) 20 MG tablet Take 20 mg by mouth at bedtime.    . furosemide (LASIX) 40 MG tablet Take 2 tablets (80 mg total) by mouth 2 (two) times daily. 360 tablet 3    . levothyroxine (SYNTHROID, LEVOTHROID) 200 MCG tablet Take 200 mcg by mouth daily.    . metoprolol tartrate (LOPRESSOR) 25 MG tablet Take 25 mg by mouth 2 (two) times daily. 1 every am and 1/2 at night    . omeprazole (PRILOSEC) 40 MG capsule Take 1 capsule (40 mg total) by mouth daily. 90 capsule 3  . OXYGEN 2lpm 24/7    . potassium chloride SA (K-DUR,KLOR-CON) 20 MEQ tablet Take 1 tablet (20 mEq total) by mouth 2 (two) times daily. Please schedule appointment for refills. 60 tablet 1  . pravastatin (PRAVACHOL) 40 MG tablet Take 1 tablet (40 mg total) by mouth daily. <PLEASE MAKE APPOINTMENT FOR REFILLS> 30 tablet 0  . PROAIR HFA 108 (90 Base) MCG/ACT inhaler Inhale 2 puffs into the lungs every 4 (four) hours as needed for wheezing or shortness of breath.     . traMADol (ULTRAM) 50 MG tablet Take 50 mg by mouth every 8 (eight) hours as needed (pain).    [name] UNABLE TO FIND Med Name:Cpap at night    . vitamin C (ASCORBIC ACID) 500 MG tablet Take 500 mg by mouth daily.      No current facility-administered medications for this visit.      Past Medical History:  Diagnosis Date  . Anemia   .  Asthma 06/13/2011   hx of  . Atrial fibrillation ([place])   . Atrial flutter ([place])   . Breast cancer ([place]) dx'd 2005   No BP check or stick in Left arm  . CAD (coronary artery disease)    a. Nonobstructive CAD 2007 (EF 75%.  RCA  proximal 75% tubular, 25% prox LAD, 25% d1, 50% D2) at [facility]. b. NSTEMI 01/2010 in setting of respiratory failure, medical approach (not a good candidate for noninvasive eval)  . Cellulitis    hx of cellulitis in left leg  . Cervical radiculopathy 06/13/2011  . COPD (chronic obstructive pulmonary disease) ([place])    on O2  . Depression 06/13/2011  . Diverticulosis   . Esophageal dysmotility   . Gout 06/13/2011  . History of Clostridium difficile early dec 2015   no current diarrhea  . History of home oxygen therapy    2 liters per nasal cannula all the time  . Hx of  dysfunctional uterine bleeding    last bleeding jan 2016, worked up for with d and c x 2 no cause found  . Hyperlipidemia   . Hypertension   . Hypothyroidism   . Impaired glucose tolerance 11/01/2012  . Neuropathy ([place]) 06/13/2011  . Obesity hypoventilation syndrome ([place]) 06/13/2011  . OSA on CPAP   . PFO (patent foramen ovale)   . Risk for falls   . Syncope and collapse    Bradycardia with pauses. S/P pacemaker  . Tubular adenoma of colon     ROS:   All systems reviewed and negative except as noted in the HPI.   Past Surgical History:  Procedure Laterality Date  . BREAST LUMPECTOMY Left   . CARDIOVERSION N/A 03/15/2013   Procedure: CARDIOVERSION;  Surgeon: Thayer Headings, MD;  Location: [facility] ENDOSCOPY;  Service: Cardiovascular;  Laterality: N/A;  . CERVICAL BIOPSY  June 2013   at [facility] for Heavy  Bleeding  . [place] SURGERY  2004 or 2005   have cadaver bones,, screws, and plates c 5 to c 6  . COLONOSCOPY    . COLONOSCOPY WITH PROPOFOL N/A 10/22/2014   Procedure: COLONOSCOPY WITH PROPOFOL;  Surgeon: Jerene Bears, MD;  Location: [facility] ENDOSCOPY;  Service: Gastroenterology;  Laterality: N/A;  . DILATION AND CURETTAGE OF UTERUS    . LAPAROSCOPIC APPENDECTOMY  03/29/2012   Procedure: APPENDECTOMY LAPAROSCOPIC;  Surgeon: Adin Hector, MD;  Location: [facility] ORS;  Service: General;  Laterality: N/A;  . LOOP RECORDER EXPLANT  08/31/2013   Procedure: LOOP RECORDER EXPLANT;  Surgeon: Coralyn Mark, MD;  Location: [facility] CATH LAB;  Service: Cardiovascular;;  . LOOP RECORDER IMPLANT  08-28-2013; 08-31-2013   MDT LinQ implanted by Dr Rayann Heman for syncope; explanted 08-31-2013 after sinus pauses identified  . LOOP RECORDER IMPLANT N/A 08/28/2013   Procedure: LOOP RECORDER IMPLANT;  Surgeon: Coralyn Mark, MD;  Location: [place] CATH LAB;  Service: Cardiovascular;  Laterality: N/A;  . OVARIAN CYST REMOVAL    . PACEMAKER INSERTION  08-31-2013   Biotronik Evera dual chamber pacemaker placed for  neurocardiogenic syncope and sinus pauses by Dr Rayann Heman  . PERMANENT PACEMAKER INSERTION N/A 08/31/2013   Procedure: PERMANENT PACEMAKER INSERTION;  Surgeon: Coralyn Mark, MD;  Location: [place] CATH LAB;  Service: Cardiovascular;  Laterality: N/A;  . TEE WITHOUT CARDIOVERSION N/A 03/15/2013   Procedure: TRANSESOPHAGEAL ECHOCARDIOGRAM (TEE);  Surgeon: Thayer Headings, MD;  Location: [facility] ENDOSCOPY;  Service: Cardiovascular;  Laterality: N/A;     Family History  Problem  Relation Age of Onset  . Uterine cancer Mother   . Heart disease Mother   . Breast cancer Sister 95  . Ovarian cancer Sister   . Breast cancer Cousin 27  . Diabetes Brother   . Breast cancer Maternal Aunt   . Ovarian cancer Sister      Social History   Social History  . Marital status: Divorced    Spouse name: N/A  . Number of children: 2  . Years of education: N/A   Occupational History  . Retired    Social History Main Topics  . Smoking status: Former Smoker    Packs/day: 1.00    Years: 20.00    Types: Cigarettes    Quit date: 09/20/1998  . Smokeless tobacco: Never Used     Comment: 1ppd x 20 years  . Alcohol use 0.6 oz/week    1 Glasses of wine per week     Comment: rare  . Drug use: No  . Sexual activity: Not Currently    Birth control/ protection: Post-menopausal   Other Topics Concern  . Not on file   Social History Narrative   Lives alone in an apartment.     BP 114/64   Pulse 92   Ht 5' 5.5" (1.664 m)   Wt (!) 311 lb 6.4 oz (141.3 kg)   SpO2 95%   BMI 51.03 kg/m   Physical Exam:  Obese appearing 71 yo woman wearing home oxygen, and in NAD HEENT: Unremarkable Neck:  7 cm JVD, no thyromegally Back:  No CVA tenderness Lungs:  Clear with no wheezes; decreased breath sounds throughout. HEART:  Regular rate rhythm, soft AI murmur, no rubs, no clicks Abd:  soft, positive bowel sounds, no organomegally, no rebound, no guarding Ext:  2 plus pulses, no edema, no cyanosis, no clubbing Skin:   No rashes no nodules Neuro:  CN II through XII intact, motor grossly intact  DEVICE  Normal device function.  See PaceArt for details.   Assess/Plan: 1. Chronic diastolic heart failure - she is encourated to take her meds, lose weight, and maintain a low sodium diet. 2. COPD - she will continue her bronchodilators and oxygen. 3. Obesity -we discussed the importance of weight loss. 4. PPM - her biotronik DDD PM is working normally. Will follow.  Mikle Bosworth.D.

## 2016-11-08 IMAGING — MG MM DIAG BREAST TOMO UNI LEFT
3 series · 3 of 7 positions shown · non-contrast
Comparison: Previous exams including recent screening mammogram
dated 05/26/2014.

CLINICAL DATA: Patient returns today to evaluate left breast
calcifications and a possible left breast asymmetry.

Patient also with a history of previous left breast cancer in 7445
status post lumpectomy and radiation therapy.
EXAM:
DIGITAL DIAGNOSTIC LEFT MAMMOGRAM WITH 3D TOMOSYNTHESIS

[L ML]
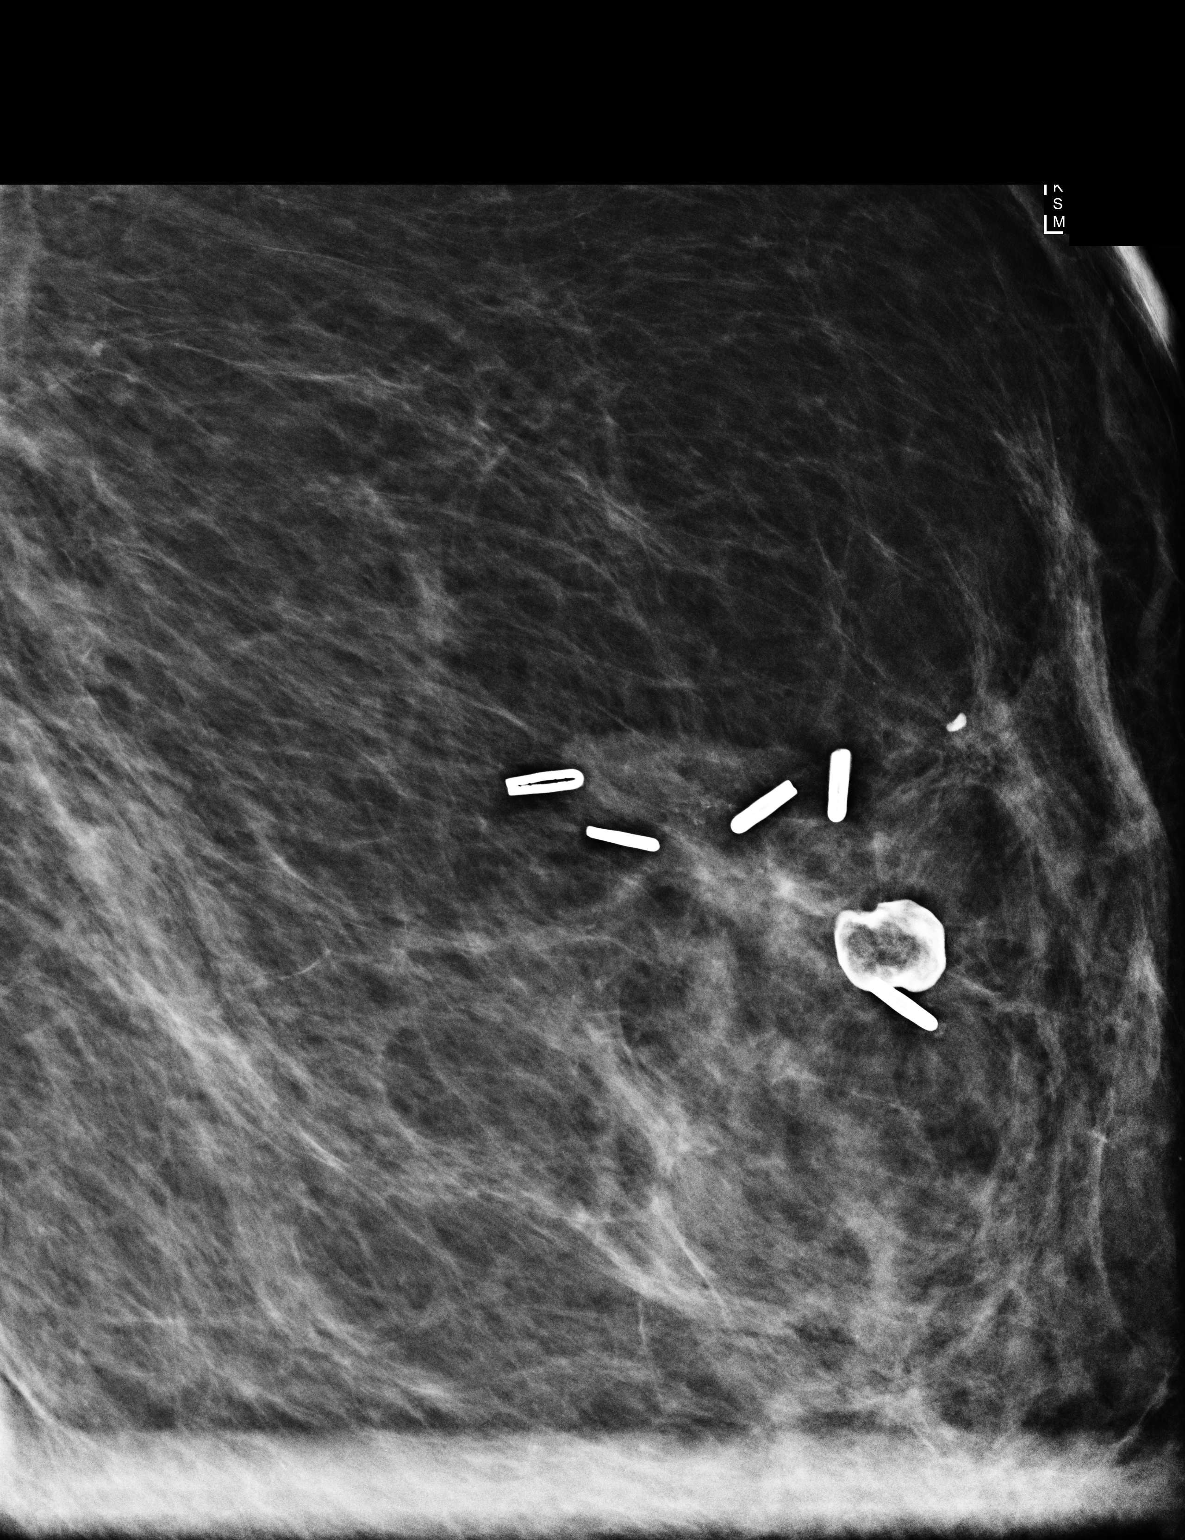

[L CC]
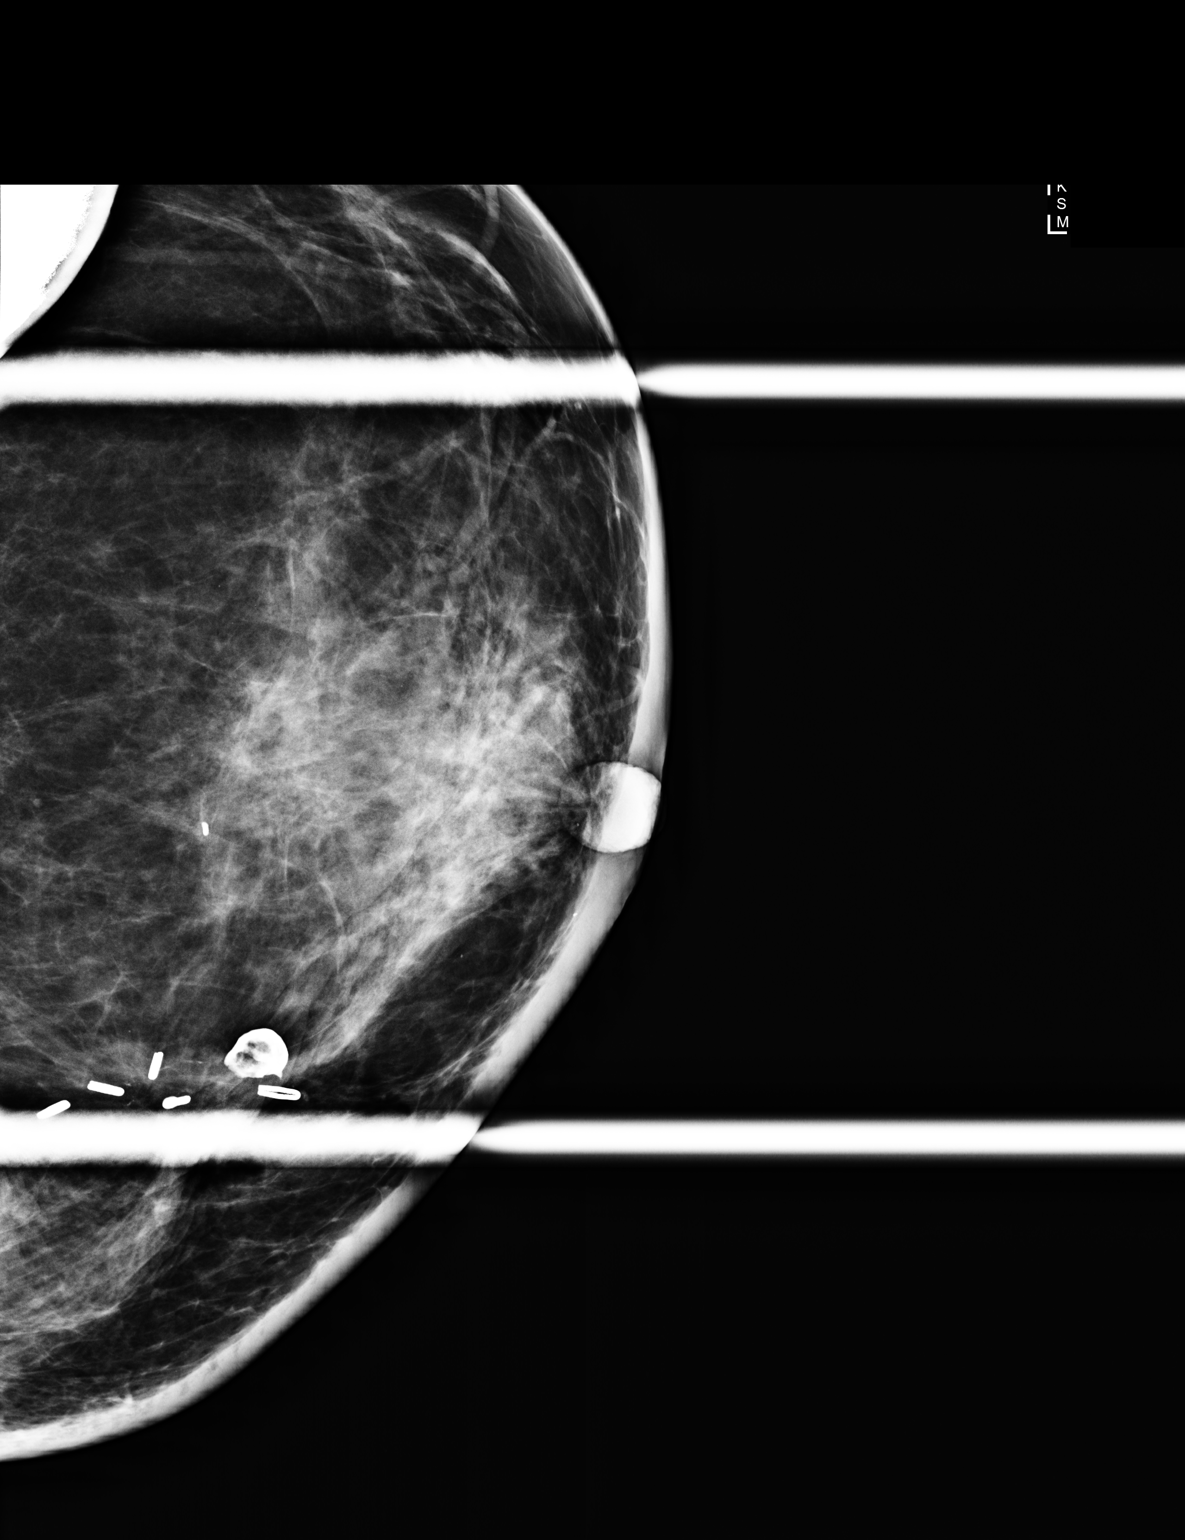

[L CC tomo · tomo slice 37/72.0]
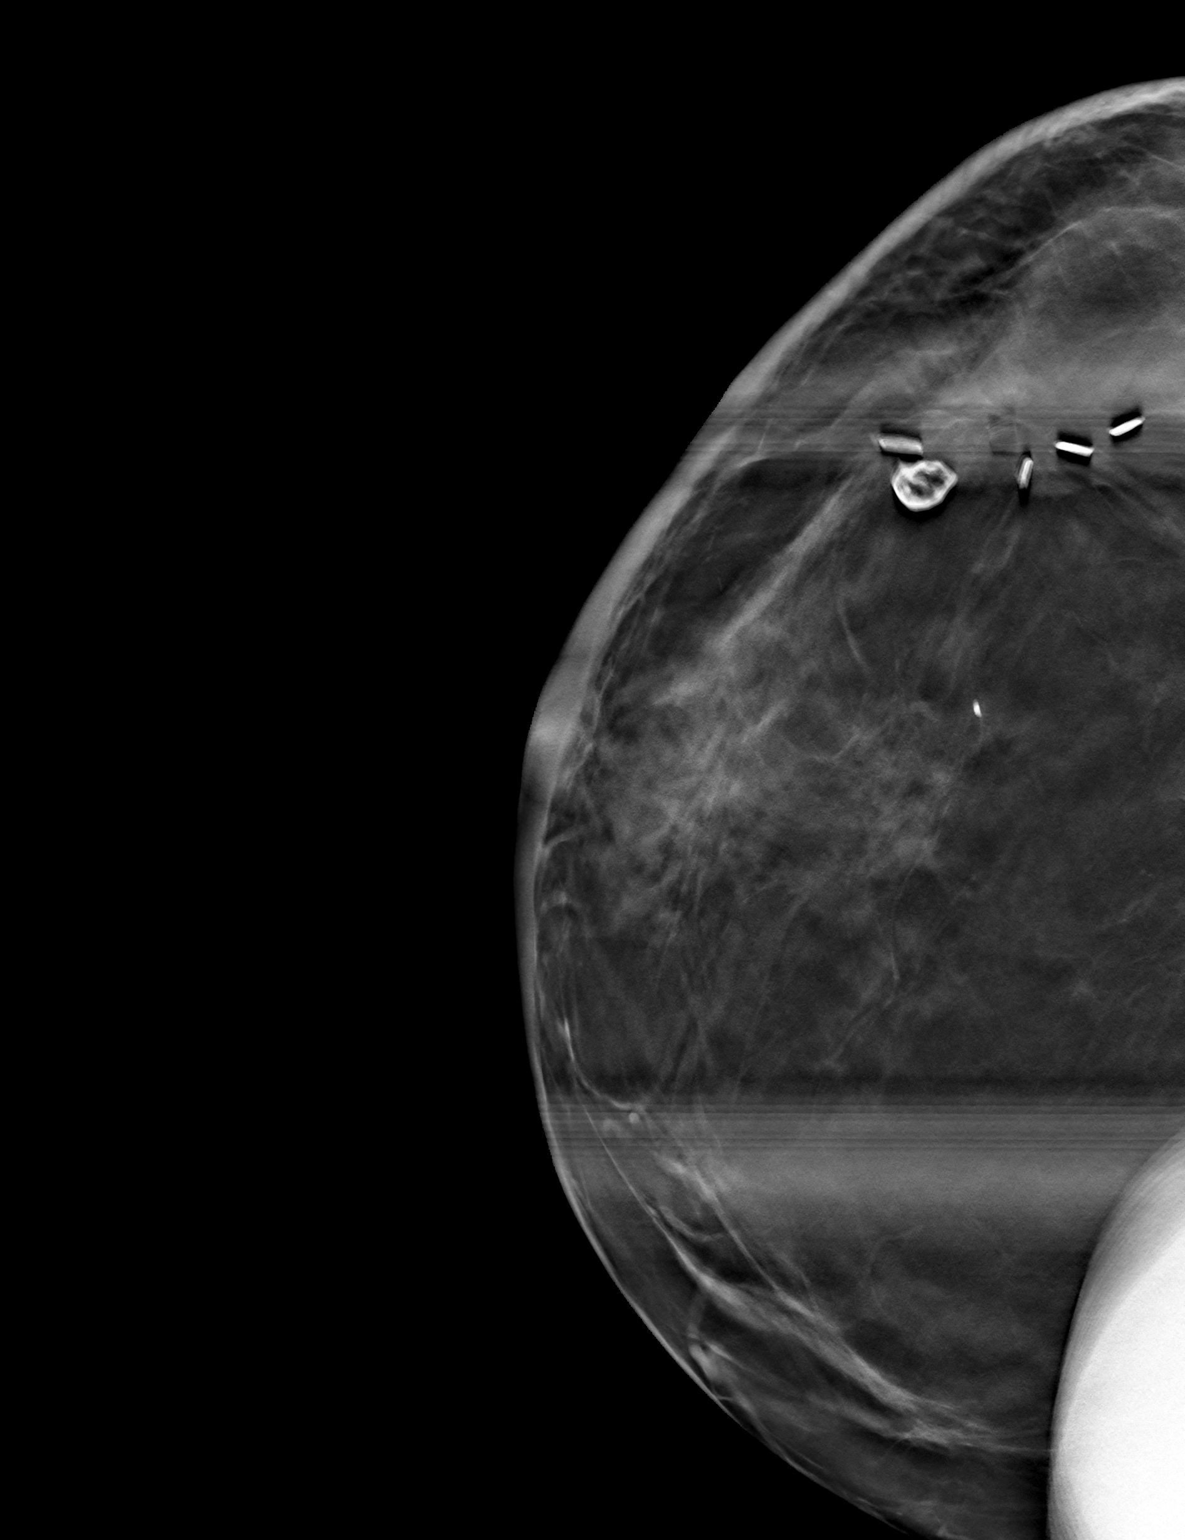

[3 of 7 positions shown; findings below may reference images not displayed]

ACR Breast Density Category b: There are scattered areas of
fibroglandular density.
FINDINGS: On today's additional magnification views, there are several loosely
grouped punctate and coarse calcifications confirmed within the
inner left breast at the surgical site, the morphology of the
calcifications better seen on the CC projection.

On today's additional views with spot compression and tomosynthesis,
the retroareolar asymmetry has the appearance of normal dense
superimposed fibroglandular tissues. No dominant masses or
architectural distortion.
IMPRESSION: 1. Several loosely grouped punctate and coarse calcifications within
the inner left breast at the surgical site, morphology better
appreciated on the CC projection. These are probably benign
calcifications for which a six-month follow-up left breast
diagnostic mammogram, with magnification views, is recommended.
2. Left breast retroareolar asymmetry compatible with
superimposition of normal dense fibroglandular tissues on today's
additional views with tomosynthesis. Attention to this area also
recommended on the six-month follow-up exam to ensure stability.

RECOMMENDATION:
Follow-up left breast diagnostic mammogram, with possible
ultrasound, in 6 months.

I have discussed the findings and recommendations with the patient.
Results were also provided in writing at the conclusion of the
visit. If applicable, a reminder letter will be sent to the patient
regarding the next appointment.

BI-RADS CATEGORY  3: Probably benign.

## 2016-11-12 ENCOUNTER — Ambulatory Visit (INDEPENDENT_AMBULATORY_CARE_PROVIDER_SITE_OTHER): Payer: Medicare Other | Admitting: Internal Medicine

## 2016-11-12 ENCOUNTER — Encounter: Payer: Self-pay | Admitting: Internal Medicine

## 2016-11-12 ENCOUNTER — Telehealth: Payer: Self-pay | Admitting: Emergency Medicine

## 2016-11-12 ENCOUNTER — Ambulatory Visit
Admission: RE | Admit: 2016-11-12 | Discharge: 2016-11-12 | Disposition: A | Discharge status: Home / Self Care | Payer: Medicare Other | Source: Ambulatory Visit | Attending: Internal Medicine | Admitting: Internal Medicine

## 2016-11-12 VITALS — BP 116/82 | HR 99 | Temp 98.3°F | Ht 65.5 in | Wt 320.0 lb

## 2016-11-12 DIAGNOSIS — I2510 Atherosclerotic heart disease of native coronary artery without angina pectoris: Secondary | ICD-10-CM | POA: Diagnosis not present

## 2016-11-12 DIAGNOSIS — E039 Hypothyroidism, unspecified: Secondary | ICD-10-CM | POA: Diagnosis not present

## 2016-11-12 DIAGNOSIS — E2839 Other primary ovarian failure: Secondary | ICD-10-CM

## 2016-11-12 DIAGNOSIS — R7302 Impaired glucose tolerance (oral): Secondary | ICD-10-CM | POA: Diagnosis not present

## 2016-11-12 DIAGNOSIS — E785 Hyperlipidemia, unspecified: Secondary | ICD-10-CM | POA: Diagnosis not present

## 2016-11-12 DIAGNOSIS — I10 Essential (primary) hypertension: Secondary | ICD-10-CM | POA: Diagnosis not present

## 2016-11-12 NOTE — Progress Notes (Signed)
Subjective:    Patient ID: Rich Number, female    DOB: 05-Dec-1945, 71 y.o.   MRN: [id number]  HPI  Here to f/u; overall doing ok,  Pt denies chest pain, increasing sob or doe, wheezing, orthopnea, PND, worsening LE edema over chronic, palpitations, dizziness or syncope.  Pt denies new neurological symptoms such as new headache, or facial or extremity weakness or numbness.  Pt denies polydipsia, polyuria, or low sugar episode.   Pt denies new neurological symptoms such as new headache, or facial or extremity weakness or numbness.   Pt states overall good compliance with meds, mostly trying to follow appropriate diet, with wt overall stable,  but little exercise however.  Now back to Home o2 2L continuous after last pulm visit about 2 wks ago, after off for several years. Now on diuretic increased as well per cardiology with improved pulm edema, but still requires the home o2.  All this has led to increased stress and depression, but no SI or HI.  Denies hyper or hypo thyroid symptoms such as voice, skin or hair change. Past Medical History:  Diagnosis Date  . Anemia   . Asthma 06/13/2011   hx of  . Atrial fibrillation ([place])   . Atrial flutter ([place])   . Breast cancer ([place]) dx'd 2005   No BP check or stick in Left arm  . CAD (coronary artery disease)    a. Nonobstructive CAD 2007 (EF 75%.  RCA  proximal 75% tubular, 25% prox LAD, 25% d1, 50% D2) at [facility]. b. NSTEMI 01/2010 in setting of respiratory failure, medical approach (not a good candidate for noninvasive eval)  . Cellulitis    hx of cellulitis in left leg  . Cervical radiculopathy 06/13/2011  . COPD (chronic obstructive pulmonary disease) ([place])    on O2  . Depression 06/13/2011  . Diverticulosis   . Esophageal dysmotility   . Gout 06/13/2011  . History of Clostridium difficile early dec 2015   no current diarrhea  . History of home oxygen therapy    2 liters per nasal cannula all the time  . Hx of dysfunctional uterine bleeding    last bleeding jan 2016, worked up for with d and c x 2 no cause found  . Hyperlipidemia   . Hypertension   . Hypothyroidism   . Impaired glucose tolerance 11/01/2012  . Neuropathy ([place]) 06/13/2011  . Obesity hypoventilation syndrome ([place]) 06/13/2011  . OSA on CPAP   . PFO (patent foramen ovale)   . Risk for falls   . Syncope and collapse    Bradycardia with pauses. S/P pacemaker  . Tubular adenoma of colon    Past Surgical History:  Procedure Laterality Date  . BREAST LUMPECTOMY Left   . CARDIOVERSION N/A 03/15/2013   Procedure: CARDIOVERSION;  Surgeon: Thayer Headings, MD;  Location: [facility] ENDOSCOPY;  Service: Cardiovascular;  Laterality: N/A;  . CERVICAL BIOPSY  June 2013   at [facility] for Heavy  Bleeding  . [place] SURGERY  2004 or 2005   have cadaver bones,, screws, and plates c 5 to c 6  . COLONOSCOPY    . COLONOSCOPY WITH PROPOFOL N/A 10/22/2014   Procedure: COLONOSCOPY WITH PROPOFOL;  Surgeon: Jerene Bears, MD;  Location: [facility] ENDOSCOPY;  Service: Gastroenterology;  Laterality: N/A;  . DILATION AND CURETTAGE OF UTERUS    . LAPAROSCOPIC APPENDECTOMY  03/29/2012   Procedure: APPENDECTOMY LAPAROSCOPIC;  Surgeon: Adin Hector, MD;  Location: [facility] ORS;  Service: General;  Laterality: N/A;  . LOOP RECORDER EXPLANT  08/31/2013   Procedure: LOOP RECORDER EXPLANT;  Surgeon: Coralyn Mark, MD;  Location: [place] CATH LAB;  Service: Cardiovascular;;  . LOOP RECORDER IMPLANT  08-28-2013; 08-31-2013   MDT LinQ implanted by Dr Rayann Heman for syncope; explanted 08-31-2013 after sinus pauses identified  . LOOP RECORDER IMPLANT N/A 08/28/2013   Procedure: LOOP RECORDER IMPLANT;  Surgeon: Coralyn Mark, MD;  Location: [place] CATH LAB;  Service: Cardiovascular;  Laterality: N/A;  . OVARIAN CYST REMOVAL    . PACEMAKER INSERTION  08-31-2013   Biotronik Evera dual chamber pacemaker placed for neurocardiogenic syncope and sinus pauses by Dr Rayann Heman  . PERMANENT PACEMAKER INSERTION N/A 08/31/2013   Procedure:  PERMANENT PACEMAKER INSERTION;  Surgeon: Coralyn Mark, MD;  Location: [place] CATH LAB;  Service: Cardiovascular;  Laterality: N/A;  . TEE WITHOUT CARDIOVERSION N/A 03/15/2013   Procedure: TRANSESOPHAGEAL ECHOCARDIOGRAM (TEE);  Surgeon: Thayer Headings, MD;  Location: [facility] ENDOSCOPY;  Service: Cardiovascular;  Laterality: N/A;    reports that she quit smoking about 18 years ago. Her smoking use included Cigarettes. She has a 20.00 pack-year smoking history. She has never used smokeless tobacco. She reports that she drinks about 0.6 oz of alcohol per week . She reports that she does not use drugs. family history includes Breast cancer in her maternal aunt; Breast cancer (age of onset: 30) in her sister; Breast cancer (age of onset: 16) in her cousin; Diabetes in her brother; Heart disease in her mother; Ovarian cancer in her sister and sister; Uterine cancer in her mother. Allergies  Allergen Reactions  . Ciprofloxacin Other (See Comments)    foliculitis  . Cephalexin Other (See Comments)    Exfoliative dermatitis reaction  . Codeine     REACTION: unknown - pt. states unaware of having reaction to codeine  . Dilaudid [Hydromorphone] Other (See Comments)    Other Reaction: Oversedation  . Lisinopril     REACTION: cough  . Penicillins     rash  . Cleocin [Clindamycin Hcl] Rash    rash  . Doxycycline Rash   Current Outpatient Prescriptions on File Prior to Visit  Medication Sig Dispense Refill  . acetaminophen (TYLENOL) 325 MG tablet Take as needed per bottle    . allopurinol (ZYLOPRIM) 300 MG tablet Take 300 mg by mouth daily.    [name] amitriptyline (ELAVIL) 100 MG tablet Take 1 tablet (100 mg total) by mouth at bedtime. 90 tablet 1  . budesonide-formoterol (SYMBICORT) 160-4.5 MCG/ACT inhaler Inhale 2 puffs into the lungs 2 (two) times daily. 3 Inhaler 3  . Cholecalciferol (VITAMIN D) 2000 UNITS tablet Take 2,000 Units by mouth at bedtime.    . clonazePAM (KLONOPIN) 0.5 MG tablet Take 1 tablet (0.5  mg total) by mouth 2 (two) times daily as needed for anxiety. 60 tablet 2  . DULoxetine (CYMBALTA) 30 MG capsule Take 30 mg by mouth daily.    [name] ELIQUIS 5 MG TABS tablet TAKE 1 TABLET TWICE A DAY 180 tablet 3  . famotidine (PEPCID) 20 MG tablet Take 20 mg by mouth at bedtime.    . furosemide (LASIX) 40 MG tablet Take 2 tablets (80 mg total) by mouth 2 (two) times daily. 360 tablet 3  . levothyroxine (SYNTHROID, LEVOTHROID) 200 MCG tablet Take 200 mcg by mouth daily.    . metoprolol tartrate (LOPRESSOR) 25 MG tablet Take 25 mg by mouth 2 (two) times daily. 1 every am and 1/2 at night    .  omeprazole (PRILOSEC) 40 MG capsule Take 1 capsule (40 mg total) by mouth daily. 90 capsule 3  . OXYGEN 2lpm 24/7    . potassium chloride SA (K-DUR,KLOR-CON) 20 MEQ tablet Take 1 tablet (20 mEq total) by mouth 2 (two) times daily. Please schedule appointment for refills. 60 tablet 1  . pravastatin (PRAVACHOL) 40 MG tablet Take 1 tablet (40 mg total) by mouth daily. <PLEASE MAKE APPOINTMENT FOR REFILLS> 30 tablet 0  . PROAIR HFA 108 (90 Base) MCG/ACT inhaler Inhale 2 puffs into the lungs every 4 (four) hours as needed for wheezing or shortness of breath.     . traMADol (ULTRAM) 50 MG tablet Take 50 mg by mouth every 8 (eight) hours as needed (pain).    [name] UNABLE TO FIND Med Name:Cpap at night    . vitamin C (ASCORBIC ACID) 500 MG tablet Take 500 mg by mouth daily.      No current facility-administered medications on file prior to visit.     Review of Systems  Constitutional: Negative for unusual diaphoresis or night sweats HENT: Negative for ear swelling or discharge Eyes: Negative for worsening visual haziness  Respiratory: Negative for choking and stridor.   Gastrointestinal: Negative for distension or worsening eructation Genitourinary: Negative for retention or change in urine volume.  Musculoskeletal: Negative for other MSK pain or swelling Skin: Negative for color change and worsening  wound Neurological: Negative for tremors and numbness other than noted  Psychiatric/Behavioral: Negative for decreased concentration or agitation other than above   ALl other system neg per pt    Objective:   Physical Exam BP 116/82   Pulse 99   Temp 98.3 F (36.8 C)   Ht 5' 5.5" (1.664 m)   Wt (!) 320 lb (145.2 kg)   SpO2 94%   BMI 52.44 kg/m  VS noted, on Home o2 Constitutional: Pt appears in no apparent distress HENT: Head: NCAT.  Right Ear: External ear normal.  Left Ear: External ear normal.  Eyes: . Pupils are equal, round, and reactive to light. Conjunctivae and EOM are normal Neck: Normal range of motion. Neck supple.  Cardiovascular: Normal rate and regular rhythm.   Pulmonary/Chest: Effort normal and breath sounds without rales or wheezing.  Neurological: Pt is alert. Not confused , motor grossly intact Skin: Skin is warm. No rash, chronic 1+ bilat LE edema Psychiatric: Pt behavior is normal. No agitation.  No other new exam findings    Assessment & Plan:

## 2016-11-12 NOTE — Telephone Encounter (Signed)
Ok, this is done 

## 2016-11-12 NOTE — Telephone Encounter (Signed)
Patient needs a bone density scheduled. She is over the weight limit for the table downstairs. Can we see if we can get her scheduled over at the hospital? Thanks.

## 2016-11-12 NOTE — Telephone Encounter (Signed)
Can you please put another order in for her [facility] D[facility] thanks

## 2016-11-12 NOTE — Patient Instructions (Addendum)
Please schedule the bone density test before leaving today at the scheduling desk (where you check out)  Please continue all other medications as before, and refills have been done if requested.  Please have the pharmacy call with any other refills you may need.  Please continue your efforts at being more active, low cholesterol diet, and weight control.  You are otherwise up to date with prevention measures today.  Please keep your appointments with your specialists as you may have planned  If you have Medicare related insurance (such as traditoinal Medicare, [employer] or [employer], or similar), Please make an appointment at the Scheduling desk with Sharee Pimple, the [employer], for your Wellness Visit in this office, which is a benefit with your insurance.  Please return in 6 months, or sooner if needed

## 2016-11-13 NOTE — Assessment & Plan Note (Signed)
stable overall by history and exam, recent data reviewed with pt, and pt to continue medical treatment as before,  to f/u any worsening symptoms or concerns BP Readings from Last 3 Encounters:  11/12/16 116/82  11/05/16 114/64  11/03/16 130/84

## 2016-11-13 NOTE — Assessment & Plan Note (Signed)
Lab Results  Component Value Date   TSH 0.84 11/03/2016   stable overall by history and exam, recent data reviewed with pt, and pt to continue medical treatment as before,  to f/u any worsening symptoms or concerns

## 2016-11-13 NOTE — Assessment & Plan Note (Signed)
stable overall by history and exam, recent data reviewed with pt, and pt to continue medical treatment as before,  to f/u any worsening symptoms or concerns Lab Results  Component Value Date   HGBA1C 5.5 05/06/2015

## 2016-11-13 NOTE — Assessment & Plan Note (Signed)
stable overall by history and exam, recent data reviewed with pt, and pt to continue medical treatment as before,  to f/u any worsening symptoms or concerns Lab Results  Component Value Date   LDLCALC 97 05/06/2015

## 2016-11-15 DIAGNOSIS — R7989 Other specified abnormal findings of blood chemistry: Secondary | ICD-10-CM | POA: Diagnosis not present

## 2016-11-16 LAB — BASIC METABOLIC PANEL
BUN: 7 mg/dL (ref 7–25)
CO2: 33 mmol/L — ABNORMAL HIGH (ref 20–31)
Calcium: 9 mg/dL (ref 8.6–10.4)
Chloride: 99 mmol/L (ref 98–110)
Creat: 0.82 mg/dL (ref 0.60–0.93)
Glucose, Bld: 91 mg/dL (ref 65–99)
POTASSIUM: 3.7 mmol/L (ref 3.5–5.3)
Sodium: 143 mmol/L (ref 135–146)

## 2016-11-17 NOTE — Progress Notes (Signed)
HPI: fu atrial flutter. Also with hx of CAD, HTN, HL, OSA, hypothyroidism, COPD on O2, L breast CA. LHC at [facility] 10/2005: pRCA 75%, LM < 25%, pLAD 25%, D1 25%, D2 50%. Carotid Dopplers June 2013 showed no significant stenosis. She suffered a NSTEMI in 01/2010 in the setting of SIRS from leg cellulitis. This was felt to be demand ischemia. Med Rx was pursued (felt to be a poor candidate for noninvasive imaging). Patient had atrial flutter with RVR 6/14. She was felt to be a poor candidate for atrial flutter ablation. TEE guided cardioversion was performed. She had restoration of NSR. TEE 03/15/13: EF 55-60%, no LA clot, positive PFO, mild to moderate TR. Patient admitted in December of 2014 following a syncopal episode. She had an implantable loop placed which demonstrated symptomatic bradycardia with pauses and ultimately had a pacemaker placed. At last office visit patient was complaining of increased dyspnea and pedal edema. Her Lasix had recently been increased and her thyroid medications adjusted. Echocardiogram repeated February 2018 showed normal LV function, mild left ventricular hypertrophy, grade 2 diastolic dysfunction and mild to moderate aortic insufficiency.  Since last seen, her dyspnea has improved. There is no orthopnea or PND. Her pedal edema has resolved. No chest pain.  Current Outpatient Prescriptions  Medication Sig Dispense Refill  . acetaminophen (TYLENOL) 325 MG tablet Take as needed per bottle    . allopurinol (ZYLOPRIM) 300 MG tablet Take 300 mg by mouth daily.    [name] amitriptyline (ELAVIL) 100 MG tablet Take 1 tablet (100 mg total) by mouth at bedtime. 90 tablet 1  . budesonide-formoterol (SYMBICORT) 160-4.5 MCG/ACT inhaler Inhale 2 puffs into the lungs 2 (two) times daily. 3 Inhaler 3  . Cholecalciferol (VITAMIN D) 2000 UNITS tablet Take 2,000 Units by mouth at bedtime.    . clonazePAM (KLONOPIN) 0.5 MG tablet Take 1 tablet (0.5 mg total) by mouth 2 (two) times daily as  needed for anxiety. 60 tablet 2  . DULoxetine (CYMBALTA) 30 MG capsule Take 30 mg by mouth daily.    [name] ELIQUIS 5 MG TABS tablet TAKE 1 TABLET TWICE A DAY 180 tablet 3  . famotidine (PEPCID) 20 MG tablet Take 20 mg by mouth at bedtime.    . furosemide (LASIX) 40 MG tablet Take 2 tablets (80 mg total) by mouth 2 (two) times daily. 360 tablet 3  . levothyroxine (SYNTHROID, LEVOTHROID) 200 MCG tablet Take 200 mcg by mouth daily.    . metoprolol tartrate (LOPRESSOR) 25 MG tablet Take 25 mg by mouth 2 (two) times daily. 1 every am and 1/2 at night    . omeprazole (PRILOSEC) 40 MG capsule Take 1 capsule (40 mg total) by mouth daily. 90 capsule 3  . OXYGEN 2lpm 24/7    . potassium chloride SA (K-DUR,KLOR-CON) 20 MEQ tablet Take 1 tablet (20 mEq total) by mouth 2 (two) times daily. Please schedule appointment for refills. 60 tablet 1  . pravastatin (PRAVACHOL) 40 MG tablet Take 1 tablet (40 mg total) by mouth daily. <PLEASE MAKE APPOINTMENT FOR REFILLS> 30 tablet 0  . PROAIR HFA 108 (90 Base) MCG/ACT inhaler Inhale 2 puffs into the lungs every 4 (four) hours as needed for wheezing or shortness of breath.     . traMADol (ULTRAM) 50 MG tablet Take 50 mg by mouth every 8 (eight) hours as needed (pain).    [name] UNABLE TO FIND Med Name:Cpap at night    . vitamin C (ASCORBIC ACID) 500 MG  tablet Take 500 mg by mouth daily.      No current facility-administered medications for this visit.      Past Medical History:  Diagnosis Date  . Anemia   . Asthma 06/13/2011   hx of  . Atrial fibrillation ([place])   . Atrial flutter ([place])   . Breast cancer ([place]) dx'd 2005   No BP check or stick in Left arm  . CAD (coronary artery disease)    a. Nonobstructive CAD 2007 (EF 75%.  RCA  proximal 75% tubular, 25% prox LAD, 25% d1, 50% D2) at [facility]. b. NSTEMI 01/2010 in setting of respiratory failure, medical approach (not a good candidate for noninvasive eval)  . Cellulitis    hx of cellulitis in left leg  . Cervical  radiculopathy 06/13/2011  . COPD (chronic obstructive pulmonary disease) ([place])    on O2  . Depression 06/13/2011  . Diverticulosis   . Esophageal dysmotility   . Gout 06/13/2011  . History of Clostridium difficile early dec 2015   no current diarrhea  . History of home oxygen therapy    2 liters per nasal cannula all the time  . Hx of dysfunctional uterine bleeding    last bleeding jan 2016, worked up for with d and c x 2 no cause found  . Hyperlipidemia   . Hypertension   . Hypothyroidism   . Impaired glucose tolerance 11/01/2012  . Neuropathy ([place]) 06/13/2011  . Obesity hypoventilation syndrome ([place]) 06/13/2011  . OSA on CPAP   . PFO (patent foramen ovale)   . Risk for falls   . Syncope and collapse    Bradycardia with pauses. S/P pacemaker  . Tubular adenoma of colon     Past Surgical History:  Procedure Laterality Date  . BREAST LUMPECTOMY Left   . CARDIOVERSION N/A 03/15/2013   Procedure: CARDIOVERSION;  Surgeon: Thayer Headings, MD;  Location: [facility] ENDOSCOPY;  Service: Cardiovascular;  Laterality: N/A;  . CERVICAL BIOPSY  June 2013   at [facility] for Heavy  Bleeding  . [place] SURGERY  2004 or 2005   have cadaver bones,, screws, and plates c 5 to c 6  . COLONOSCOPY    . COLONOSCOPY WITH PROPOFOL N/A 10/22/2014   Procedure: COLONOSCOPY WITH PROPOFOL;  Surgeon: Jerene Bears, MD;  Location[facility] ENDOSCOPY;  Service: Gastroenterology;  Laterality: N/A;  . DILATION AND CURETTAGE OF UTERUS    . LAPAROSCOPIC APPENDECTOMY  03/29/2012   Procedure: APPENDECTOMY LAPAROSCOPIC;  Surgeon: Adin Hector, MD;  Location: [facility] ORS;  Service: General;  Laterality: N/A;  . LOOP RECORDER EXPLANT  08/31/2013   Procedure: LOOP RECORDER EXPLANT;  Surgeon: Coralyn Mark, MD;  Location: [facility] CATH LAB;  Service: Cardiovascular;;  . LOOP RECORDER IMPLANT  08-28-2013; 08-31-2013   MDT LinQ implanted by Dr Rayann Heman for syncope; explanted 08-31-2013 after sinus pauses identified  . LOOP RECORDER IMPLANT N/A  08/28/2013   Procedure: LOOP RECORDER IMPLANT;  Surgeon: Coralyn Mark, MD;  Location: [place] CATH LAB;  Service: Cardiovascular;  Laterality: N/A;  . OVARIAN CYST REMOVAL    . PACEMAKER INSERTION  08-31-2013   Biotronik Evera dual chamber pacemaker placed for neurocardiogenic syncope and sinus pauses by Dr Rayann Heman  . PERMANENT PACEMAKER INSERTION N/A 08/31/2013   Procedure: PERMANENT PACEMAKER INSERTION;  Surgeon: Coralyn Mark, MD;  Location: [place] CATH LAB;  Service: Cardiovascular;  Laterality: N/A;  . TEE WITHOUT CARDIOVERSION N/A 03/15/2013   Procedure: TRANSESOPHAGEAL ECHOCARDIOGRAM (TEE);  Surgeon: Wonda Cheng  Nahser, MD;  Location: [place] ENDOSCOPY;  Service: Cardiovascular;  Laterality: N/A;    Social History   Social History  . Marital status: Divorced    Spouse name: N/A  . Number of children: 2  . Years of education: N/A   Occupational History  . Retired    Social History Main Topics  . Smoking status: Former Smoker    Packs/day: 1.00    Years: 20.00    Types: Cigarettes    Quit date: 09/20/1998  . Smokeless tobacco: Never Used     Comment: 1ppd x 20 years  . Alcohol use 0.6 oz/week    1 Glasses of wine per week     Comment: rare  . Drug use: No  . Sexual activity: Not Currently    Birth control/ protection: Post-menopausal   Other Topics Concern  . Not on file   Social History Narrative   Lives alone in an apartment.    Family History  Problem Relation Age of Onset  . Uterine cancer Mother   . Heart disease Mother   . Breast cancer Sister 84  . Ovarian cancer Sister   . Breast cancer Cousin 31  . Diabetes Brother   . Breast cancer Maternal Aunt   . Ovarian cancer Sister     ROS: no fevers or chills, productive cough, hemoptysis, dysphasia, odynophagia, melena, hematochezia, dysuria, hematuria, rash, seizure activity, orthopnea, PND, pedal edema, claudication. Remaining systems are negative.  Physical Exam: Well-developed obese in no acute distress.  Skin is  warm and dry.  HEENT is normal.  Neck is supple.  Chest is clear to auscultation with normal expansion.  Cardiovascular exam is regular rate and rhythm.  Abdominal exam nontender or distended. No masses palpated. Extremities show no edema. neuro grossly intact  A/P  1 Coronary artery disease-continue statin. No aspirin given need for anticoagulation.  2 hyperlipidemia-continue statin.   3 hypertension-blood pressure controlled. Continue present medications.  4 history of atrial flutter-continue beta blocker and apixaban.  5 prior pacemaker -per EP  6 congestive heart failure-her symptoms are much better. Continue present dose of Lasix. We discussed fluid restriction and low sodium diet. She will weigh daily and take an additional 40 mg of Lasix for weight gain of 2-3 pounds.  7 morbid obesity-We discussed importance of weight loss.  8 history of aortic insufficiency-she will need follow-up echoes in the future.     Kirk Ruths, MD

## 2016-11-22 ENCOUNTER — Encounter: Payer: Self-pay | Admitting: Cardiology

## 2016-11-22 ENCOUNTER — Ambulatory Visit (INDEPENDENT_AMBULATORY_CARE_PROVIDER_SITE_OTHER): Payer: Medicare Other | Admitting: Cardiology

## 2016-11-22 VITALS — BP 118/61 | HR 96 | Ht 65.5 in | Wt 317.0 lb

## 2016-11-22 DIAGNOSIS — Z950 Presence of cardiac pacemaker: Secondary | ICD-10-CM | POA: Diagnosis not present

## 2016-11-22 DIAGNOSIS — I509 Heart failure, unspecified: Secondary | ICD-10-CM | POA: Diagnosis not present

## 2016-11-22 DIAGNOSIS — I2510 Atherosclerotic heart disease of native coronary artery without angina pectoris: Secondary | ICD-10-CM | POA: Diagnosis not present

## 2016-11-22 DIAGNOSIS — I4892 Unspecified atrial flutter: Secondary | ICD-10-CM

## 2016-11-22 DIAGNOSIS — I10 Essential (primary) hypertension: Secondary | ICD-10-CM | POA: Diagnosis not present

## 2016-11-22 DIAGNOSIS — I110 Hypertensive heart disease with heart failure: Secondary | ICD-10-CM

## 2016-11-22 NOTE — Patient Instructions (Signed)
Your physician recommends that you schedule a follow-up appointment in: 8 WEEKS WITH DR CRENSHAW  

## 2016-11-23 ENCOUNTER — Ambulatory Visit
Admission: RE | Admit: 2016-11-23 | Discharge: 2016-11-23 | Disposition: A | Discharge status: Home / Self Care | Payer: Medicare Other | Source: Ambulatory Visit | Attending: Hematology | Admitting: Hematology

## 2016-11-23 ENCOUNTER — Ambulatory Visit
Admission: RE | Admit: 2016-11-23 | Discharge: 2016-11-23 | Disposition: A | Discharge status: Home / Self Care | Payer: Medicare Other | Source: Ambulatory Visit | Attending: Internal Medicine | Admitting: Internal Medicine

## 2016-11-23 DIAGNOSIS — E2839 Other primary ovarian failure: Secondary | ICD-10-CM

## 2016-11-23 DIAGNOSIS — Z853 Personal history of malignant neoplasm of breast: Secondary | ICD-10-CM

## 2016-11-23 DIAGNOSIS — Z780 Asymptomatic menopausal state: Secondary | ICD-10-CM | POA: Diagnosis not present

## 2016-11-23 DIAGNOSIS — M85851 Other specified disorders of bone density and structure, right thigh: Secondary | ICD-10-CM | POA: Diagnosis not present

## 2016-11-23 DIAGNOSIS — R921 Mammographic calcification found on diagnostic imaging of breast: Secondary | ICD-10-CM | POA: Diagnosis not present

## 2016-12-08 ENCOUNTER — Other Ambulatory Visit: Payer: Self-pay | Admitting: Cardiology

## 2016-12-09 ENCOUNTER — Telehealth: Payer: Self-pay | Admitting: Internal Medicine

## 2016-12-09 NOTE — Telephone Encounter (Signed)
Called patient to schedule awv. Lvm for patient to call office to schedule appt.  °

## 2017-01-18 NOTE — Progress Notes (Signed)
HPI: fu atrial flutter. Also with hx of CAD, HTN, HL, OSA, hypothyroidism, COPD on O2, L breast CA. LHC at [facility] 10/2005: pRCA 75%, LM <25%, pLAD 25%, D1 25%, D2 50%. Carotid Dopplers June 2013 showed no significant stenosis. She suffered a NSTEMI in 01/2010 in the setting of SIRS from leg cellulitis. This was felt to be demand ischemia. Med Rx was pursued (felt to be a poor candidate for noninvasive imaging). Patient had atrial flutter with RVR 6/14. She was felt to be a poor candidate for atrial flutter ablation. TEE guided cardioversion was performed. She had restoration of NSR. TEE 03/15/13: EF 55-60%, no LA clot, positive PFO, mild to moderate TR. Patient admitted in December of 2014 following a syncopal episode. She had an implantable loop placed which demonstrated symptomatic bradycardia with pauses and ultimately had a pacemaker placed. Also with diastolic CHF. Echocardiogram repeated February 2018 showed normal LV function, mild left ventricular hypertrophy, grade 2 diastolic dysfunction and mild to moderate aortic insufficiency.  Since last seen, she has some dyspnea on exertion but baseline per her report. No chest pain, palpitations, syncope or bleeding.  Current Outpatient Prescriptions  Medication Sig Dispense Refill  . acetaminophen (TYLENOL) 325 MG tablet Take as needed per bottle    . allopurinol (ZYLOPRIM) 300 MG tablet Take 300 mg by mouth daily.    [name] amitriptyline (ELAVIL) 100 MG tablet Take 1 tablet (100 mg total) by mouth at bedtime. 90 tablet 1  . budesonide-formoterol (SYMBICORT) 160-4.5 MCG/ACT inhaler Inhale 2 puffs into the lungs 2 (two) times daily. 3 Inhaler 3  . Cholecalciferol (VITAMIN D) 2000 UNITS tablet Take 2,000 Units by mouth at bedtime.    . clonazePAM (KLONOPIN) 0.5 MG tablet Take 1 tablet (0.5 mg total) by mouth 2 (two) times daily as needed for anxiety. 60 tablet 2  . DULoxetine (CYMBALTA) 30 MG capsule Take 30 mg by mouth daily.    [name] ELIQUIS 5 MG TABS  tablet TAKE 1 TABLET TWICE A DAY 180 tablet 3  . famotidine (PEPCID) 20 MG tablet Take 20 mg by mouth at bedtime.    . furosemide (LASIX) 40 MG tablet Take 2 tablets (80 mg total) by mouth 2 (two) times daily. 360 tablet 3  . levothyroxine (SYNTHROID, LEVOTHROID) 200 MCG tablet Take 200 mcg by mouth daily.    . metoprolol tartrate (LOPRESSOR) 25 MG tablet Take 25 mg by mouth 2 (two) times daily. 1 every am and 1/2 at night    . omeprazole (PRILOSEC) 40 MG capsule Take 1 capsule (40 mg total) by mouth daily. 90 capsule 3  . OXYGEN 2lpm 24/7    . potassium chloride SA (K-DUR,KLOR-CON) 20 MEQ tablet Take 1 tablet (20 mEq total) by mouth 2 (two) times daily. Please schedule appointment for refills. 60 tablet 1  . potassium chloride SA (K-DUR,KLOR-CON) 20 MEQ tablet TAKE 1 TABLET TWICE A DAY (SCHEDULE APPOINTMENT FOR REFILLS, CANNOT FILL 90 DAY BECAUSE APPOINTMENT IS OVERDUE) 60 tablet 11  . pravastatin (PRAVACHOL) 40 MG tablet Take 1 tablet (40 mg total) by mouth daily. <PLEASE MAKE APPOINTMENT FOR REFILLS> 30 tablet 0  . PROAIR HFA 108 (90 Base) MCG/ACT inhaler Inhale 2 puffs into the lungs every 4 (four) hours as needed for wheezing or shortness of breath.     . traMADol (ULTRAM) 50 MG tablet Take 50 mg by mouth every 8 (eight) hours as needed (pain).    [name] UNABLE TO FIND Med Name:Cpap at night    .  vitamin C (ASCORBIC ACID) 500 MG tablet Take 500 mg by mouth daily.      No current facility-administered medications for this visit.      Past Medical History:  Diagnosis Date  . Anemia   . Asthma 06/13/2011   hx of  . Atrial fibrillation ([place])   . Atrial flutter ([place])   . Breast cancer ([place]) dx'd 2005   No BP check or stick in Left arm  . CAD (coronary artery disease)    a. Nonobstructive CAD 2007 (EF 75%.  RCA  proximal 75% tubular, 25% prox LAD, 25% d1, 50% D2) at [facility]. b. NSTEMI 01/2010 in setting of respiratory failure, medical approach (not a good candidate for noninvasive eval)  .  Cellulitis    hx of cellulitis in left leg  . Cervical radiculopathy 06/13/2011  . COPD (chronic obstructive pulmonary disease) ([place])    on O2  . Depression 06/13/2011  . Diverticulosis   . Esophageal dysmotility   . Gout 06/13/2011  . History of Clostridium difficile early dec 2015   no current diarrhea  . History of home oxygen therapy    2 liters per nasal cannula all the time  . Hx of dysfunctional uterine bleeding    last bleeding jan 2016, worked up for with d and c x 2 no cause found  . Hyperlipidemia   . Hypertension   . Hypothyroidism   . Impaired glucose tolerance 11/01/2012  . Neuropathy 06/13/2011  . Obesity hypoventilation syndrome ([place]) 06/13/2011  . OSA on CPAP   . PFO (patent foramen ovale)   . Risk for falls   . Syncope and collapse    Bradycardia with pauses. S/P pacemaker  . Tubular adenoma of colon     Past Surgical History:  Procedure Laterality Date  . BREAST LUMPECTOMY Left 05/2004  . CARDIOVERSION N/A 03/15/2013   Procedure: CARDIOVERSION;  Surgeon: Thayer Headings, MD;  Location: [facility] ENDOSCOPY;  Service: Cardiovascular;  Laterality: N/A;  . CERVICAL BIOPSY  June 2013   at [facility] for Heavy  Bleeding  . [place] SURGERY  2004 or 2005   have cadaver bones,, screws, and plates c 5 to c 6  . COLONOSCOPY    . COLONOSCOPY WITH PROPOFOL N/A 10/22/2014   Procedure: COLONOSCOPY WITH PROPOFOL;  Surgeon: Jerene Bears, MD;  Location: [facility] ENDOSCOPY;  Service: Gastroenterology;  Laterality: N/A;  . DILATION AND CURETTAGE OF UTERUS    . LAPAROSCOPIC APPENDECTOMY  03/29/2012   Procedure: APPENDECTOMY LAPAROSCOPIC;  Surgeon: Adin Hector, MD;  Location: [facility] ORS;  Service: General;  Laterality: N/A;  . LOOP RECORDER EXPLANT  08/31/2013   Procedure: LOOP RECORDER EXPLANT;  Surgeon: Coralyn Mark, MD;  Location: [facility] CATH LAB;  Service: Cardiovascular;;  . LOOP RECORDER IMPLANT  08-28-2013; 08-31-2013   MDT LinQ implanted by Dr Rayann Heman for syncope; explanted 08-31-2013 after  sinus pauses identified  . LOOP RECORDER IMPLANT N/A 08/28/2013   Procedure: LOOP RECORDER IMPLANT;  Surgeon: Coralyn Mark, MD;  Location: [place] CATH LAB;  Service: Cardiovascular;  Laterality: N/A;  . OVARIAN CYST REMOVAL    . PACEMAKER INSERTION  08-31-2013   Biotronik Evera dual chamber pacemaker placed for neurocardiogenic syncope and sinus pauses by Dr Rayann Heman  . PERMANENT PACEMAKER INSERTION N/A 08/31/2013   Procedure: PERMANENT PACEMAKER INSERTION;  Surgeon: Coralyn Mark, MD;  Location: [place] CATH LAB;  Service: Cardiovascular;  Laterality: N/A;  . TEE WITHOUT CARDIOVERSION N/A 03/15/2013   Procedure: TRANSESOPHAGEAL ECHOCARDIOGRAM (  TEE);  Surgeon: Thayer Headings, MD;  Location: [place];  Service: Cardiovascular;  Laterality: N/A;    Social History   Social History  . Marital status: Divorced    Spouse name: N/A  . Number of children: 2  . Years of education: N/A   Occupational History  . Retired    Social History Main Topics  . Smoking status: Former Smoker    Packs/day: 1.00    Years: 20.00    Types: Cigarettes    Quit date: 09/20/1998  . Smokeless tobacco: Never Used     Comment: 1ppd x 20 years  . Alcohol use 0.6 oz/week    1 Glasses of wine per week     Comment: rare  . Drug use: No  . Sexual activity: Not Currently    Birth control/ protection: Post-menopausal   Other Topics Concern  . Not on file   Social History Narrative   Lives alone in an apartment.    Family History  Problem Relation Age of Onset  . Uterine cancer Mother   . Heart disease Mother   . Breast cancer Sister 64  . Ovarian cancer Sister   . Breast cancer Cousin 43  . Diabetes Brother   . Breast cancer Maternal Aunt   . Ovarian cancer Sister     ROS: no fevers or chills, productive cough, hemoptysis, dysphasia, odynophagia, melena, hematochezia, dysuria, hematuria, rash, seizure activity, orthopnea, PND, pedal edema, claudication. Remaining systems are negative.  Physical  Exam: Well-developed obese in no acute distress.  Skin is warm and dry.  HEENT is normal.  Neck is supple.  Chest is clear to auscultation with normal expansion. No wheeze Cardiovascular exam is regular rate and rhythm.  Abdominal exam nontender or distended. No masses palpated. Extremities show no edema. neuro grossly intact   A/P  1 coronary artery disease-continue statin. No aspirin given need for anticoagulation.  2 chronic diastolic congestive heart failure-symptoms have improved with the addition of Lasix. Continue present medications.  3 hypertension-blood pressure controlled. Continue present medications.  4 hyperlipidemia-continue statin.  5 morbid obesity-we discussed the importance of weight loss.  6 aortic insufficiency-she will need follow-up echocardiograms in the future.  7 history of atrial flutter-patient remains in sinus rhythm on examination. Continue beta blocker and apixaban.  8 prior pacemaker-management per electrophysiology.  Kirk Ruths, MD

## 2017-01-25 ENCOUNTER — Encounter: Payer: Self-pay | Admitting: Cardiology

## 2017-01-25 ENCOUNTER — Ambulatory Visit (INDEPENDENT_AMBULATORY_CARE_PROVIDER_SITE_OTHER): Payer: Medicare Other | Admitting: Cardiology

## 2017-01-25 VITALS — BP 122/70 | HR 96 | Ht 65.5 in | Wt 306.0 lb

## 2017-01-25 DIAGNOSIS — E7800 Pure hypercholesterolemia, unspecified: Secondary | ICD-10-CM

## 2017-01-25 DIAGNOSIS — I10 Essential (primary) hypertension: Secondary | ICD-10-CM | POA: Diagnosis not present

## 2017-01-25 DIAGNOSIS — I2510 Atherosclerotic heart disease of native coronary artery without angina pectoris: Secondary | ICD-10-CM

## 2017-01-25 DIAGNOSIS — Z6841 Body Mass Index (BMI) 40.0 and over, adult: Secondary | ICD-10-CM | POA: Diagnosis not present

## 2017-01-25 NOTE — Patient Instructions (Signed)
Your physician wants you to follow-up in: 6 MONTHS WITH DR CRENSHAW You will receive a reminder letter in the mail two months in advance. If you don't receive a letter, please call our office to schedule the follow-up appointment.   If you need a refill on your cardiac medications before your next appointment, please call your pharmacy.  

## 2017-01-31 ENCOUNTER — Ambulatory Visit: Payer: Medicare Other | Admitting: Internal Medicine

## 2017-02-01 ENCOUNTER — Encounter: Payer: Self-pay | Admitting: Cardiology

## 2017-02-01 ENCOUNTER — Encounter: Payer: Self-pay | Admitting: Internal Medicine

## 2017-02-02 ENCOUNTER — Telehealth: Payer: Self-pay

## 2017-02-02 MED ORDER — TRAMADOL HCL 50 MG PO TABS: 50.0000 mg | ORAL_TABLET | Freq: Three times a day (TID) | ORAL | 2 refills | Status: DC | PRN

## 2017-02-02 NOTE — Telephone Encounter (Signed)
Left message for patient to call back to schedule an appointment with Dr. Tamala Julian. First available appointment per Dr. Tamala Julian.

## 2017-02-02 NOTE — Telephone Encounter (Signed)
Done hardcopy to Shirron  

## 2017-02-02 NOTE — Telephone Encounter (Signed)
Zack please above

## 2017-02-03 MED ORDER — OMEPRAZOLE 40 MG PO CPDR: 40.0000 mg | DELAYED_RELEASE_CAPSULE | Freq: Every day | ORAL | 3 refills | Status: DC

## 2017-02-03 MED ORDER — CLONAZEPAM 0.5 MG PO TABS: 0.5000 mg | ORAL_TABLET | Freq: Two times a day (BID) | ORAL | 2 refills | Status: DC | PRN

## 2017-02-03 NOTE — Telephone Encounter (Signed)
Done hardcopy to Shirron - the klonopin, and PPI done erx

## 2017-02-07 ENCOUNTER — Ambulatory Visit (INDEPENDENT_AMBULATORY_CARE_PROVIDER_SITE_OTHER): Payer: Medicare Other | Admitting: *Deleted

## 2017-02-07 DIAGNOSIS — I495 Sick sinus syndrome: Secondary | ICD-10-CM | POA: Diagnosis not present

## 2017-02-07 NOTE — Progress Notes (Signed)
Remote pacemaker transmission.   

## 2017-02-09 ENCOUNTER — Encounter: Payer: Self-pay | Admitting: Cardiology

## 2017-02-10 LAB — CUP PACEART REMOTE DEVICE CHECK
Brady Statistic RA Percent Paced: 15 %
Brady Statistic RV Percent Paced: 0 %
Implantable Lead Implant Date: 20141212
Implantable Lead Implant Date: 20141212
Implantable Lead Location: 753859
Implantable Lead Model: 350
Implantable Lead Serial Number: 29409274
Lead Channel Impedance Value: 566 Ohm
Lead Channel Sensing Intrinsic Amplitude: 4.1 mV
MDC IDC LEAD LOCATION: 753860
MDC IDC LEAD SERIAL: 29535412
MDC IDC MSMT LEADCHNL RV IMPEDANCE VALUE: 819 Ohm
MDC IDC MSMT LEADCHNL RV SENSING INTR AMPL: 10.9 mV
MDC IDC PG IMPLANT DT: 20141212
MDC IDC SESS DTM: 20180524100022
Pulse Gen Serial Number: 68181424

## 2017-02-16 ENCOUNTER — Other Ambulatory Visit: Payer: Self-pay | Admitting: Internal Medicine

## 2017-03-12 ENCOUNTER — Other Ambulatory Visit: Payer: Self-pay | Admitting: Internal Medicine

## 2017-03-14 ENCOUNTER — Other Ambulatory Visit: Payer: Self-pay | Admitting: *Deleted

## 2017-03-14 MED ORDER — POTASSIUM CHLORIDE CRYS ER 20 MEQ PO TBCR: EXTENDED_RELEASE_TABLET | ORAL | 3 refills | Status: DC

## 2017-03-15 ENCOUNTER — Other Ambulatory Visit: Payer: Self-pay | Admitting: *Deleted

## 2017-03-15 MED ORDER — POTASSIUM CHLORIDE CRYS ER 20 MEQ PO TBCR: EXTENDED_RELEASE_TABLET | ORAL | 3 refills | Status: DC

## 2017-03-29 ENCOUNTER — Encounter: Payer: Self-pay | Admitting: Internal Medicine

## 2017-03-29 DIAGNOSIS — R200 Anesthesia of skin: Secondary | ICD-10-CM

## 2017-04-14 ENCOUNTER — Encounter: Payer: Self-pay | Admitting: Neurology

## 2017-04-29 ENCOUNTER — Encounter: Payer: Self-pay | Admitting: Neurology

## 2017-04-29 ENCOUNTER — Ambulatory Visit: Payer: Medicare Other | Admitting: Neurology

## 2017-05-09 ENCOUNTER — Ambulatory Visit (INDEPENDENT_AMBULATORY_CARE_PROVIDER_SITE_OTHER): Payer: Medicare Other | Admitting: *Deleted

## 2017-05-09 DIAGNOSIS — I495 Sick sinus syndrome: Secondary | ICD-10-CM

## 2017-05-10 NOTE — Progress Notes (Signed)
Remote pacemaker transmission.   

## 2017-05-12 ENCOUNTER — Ambulatory Visit (INDEPENDENT_AMBULATORY_CARE_PROVIDER_SITE_OTHER): Payer: Medicare Other | Admitting: Internal Medicine

## 2017-05-12 ENCOUNTER — Encounter: Payer: Self-pay | Admitting: Internal Medicine

## 2017-05-12 ENCOUNTER — Telehealth: Payer: Self-pay

## 2017-05-12 ENCOUNTER — Other Ambulatory Visit: Payer: Self-pay | Admitting: Internal Medicine

## 2017-05-12 ENCOUNTER — Other Ambulatory Visit (INDEPENDENT_AMBULATORY_CARE_PROVIDER_SITE_OTHER): Payer: Medicare Other

## 2017-05-12 VITALS — BP 104/68 | HR 84 | Temp 98.3°F | Ht 65.5 in | Wt 304.0 lb

## 2017-05-12 DIAGNOSIS — E7800 Pure hypercholesterolemia, unspecified: Secondary | ICD-10-CM | POA: Diagnosis not present

## 2017-05-12 DIAGNOSIS — Z23 Encounter for immunization: Secondary | ICD-10-CM | POA: Diagnosis not present

## 2017-05-12 DIAGNOSIS — E039 Hypothyroidism, unspecified: Secondary | ICD-10-CM | POA: Diagnosis not present

## 2017-05-12 DIAGNOSIS — I10 Essential (primary) hypertension: Secondary | ICD-10-CM | POA: Diagnosis not present

## 2017-05-12 DIAGNOSIS — R7302 Impaired glucose tolerance (oral): Secondary | ICD-10-CM | POA: Diagnosis not present

## 2017-05-12 DIAGNOSIS — I2510 Atherosclerotic heart disease of native coronary artery without angina pectoris: Secondary | ICD-10-CM | POA: Diagnosis not present

## 2017-05-12 LAB — CBC WITH DIFFERENTIAL/PLATELET
BASOS PCT: 0.9 % (ref 0.0–3.0)
Basophils Absolute: 0 10*3/uL (ref 0.0–0.1)
EOS ABS: 0.2 10*3/uL (ref 0.0–0.7)
EOS PCT: 3.2 % (ref 0.0–5.0)
HCT: 41.5 % (ref 36.0–46.0)
Hemoglobin: 13.4 g/dL (ref 12.0–15.0)
LYMPHS ABS: 1.7 10*3/uL (ref 0.7–4.0)
LYMPHS PCT: 30.7 % (ref 12.0–46.0)
MCHC: 32.3 g/dL (ref 30.0–36.0)
MCV: 92.1 fl (ref 78.0–100.0)
MONO ABS: 0.4 10*3/uL (ref 0.1–1.0)
MONOS PCT: 6.9 % (ref 3.0–12.0)
NEUTROS PCT: 58.3 % (ref 43.0–77.0)
Neutro Abs: 3.3 10*3/uL (ref 1.4–7.7)
Platelets: 171 10*3/uL (ref 150.0–400.0)
RBC: 4.5 Mil/uL (ref 3.87–5.11)
RDW: 15.2 % (ref 11.5–15.5)
WBC: 5.7 10*3/uL (ref 4.0–10.5)

## 2017-05-12 LAB — LIPID PANEL
CHOL/HDL RATIO: 4
Cholesterol: 181 mg/dL (ref 0–200)
HDL: 40.4 mg/dL (ref >39.00)
LDL Cholesterol: 101 mg/dL — ABNORMAL HIGH (ref 0–99)
NONHDL: 140.71
TRIGLYCERIDES: 198 mg/dL — AB (ref 0.0–149.0)
VLDL: 39.6 mg/dL (ref 0.0–40.0)

## 2017-05-12 LAB — URINALYSIS, ROUTINE W REFLEX MICROSCOPIC
Bilirubin Urine: NEGATIVE
Hgb urine dipstick: NEGATIVE
Ketones, ur: NEGATIVE
Nitrite: POSITIVE — AB
PH: 5.5 (ref 5.0–8.0)
SPECIFIC GRAVITY, URINE: 1.02 (ref 1.000–1.030)
Total Protein, Urine: NEGATIVE
Urine Glucose: NEGATIVE
Urobilinogen, UA: 0.2 (ref 0.0–1.0)

## 2017-05-12 LAB — BASIC METABOLIC PANEL
BUN: 10 mg/dL (ref 6–23)
CALCIUM: 10 mg/dL (ref 8.4–10.5)
CO2: 35 mEq/L — ABNORMAL HIGH (ref 19–32)
CREATININE: 0.96 mg/dL (ref 0.40–1.20)
Chloride: 100 mEq/L (ref 96–112)
GFR: 73.71 mL/min (ref >60.00)
Glucose, Bld: 94 mg/dL (ref 70–99)
Potassium: 4.1 mEq/L (ref 3.5–5.1)
Sodium: 141 mEq/L (ref 135–145)

## 2017-05-12 LAB — TSH: TSH: 0.76 u[IU]/mL (ref 0.35–4.50)

## 2017-05-12 LAB — HEPATIC FUNCTION PANEL
ALT: 18 U/L (ref 0–35)
AST: 19 U/L (ref 0–37)
Albumin: 4.1 g/dL (ref 3.5–5.2)
Alkaline Phosphatase: 96 U/L (ref 39–117)
BILIRUBIN DIRECT: 0.2 mg/dL (ref 0.0–0.3)
BILIRUBIN TOTAL: 0.6 mg/dL (ref 0.2–1.2)
TOTAL PROTEIN: 8 g/dL (ref 6.0–8.3)

## 2017-05-12 LAB — HEMOGLOBIN A1C: Hgb A1c MFr Bld: 5.6 % (ref 4.6–6.5)

## 2017-05-12 MED ORDER — NITROFURANTOIN MONOHYD MACRO 100 MG PO CAPS: 100.0000 mg | ORAL_CAPSULE | Freq: Two times a day (BID) | ORAL | 0 refills | Status: DC

## 2017-05-12 MED ORDER — TRAMADOL HCL 50 MG PO TABS: 50.0000 mg | ORAL_TABLET | Freq: Three times a day (TID) | ORAL | 5 refills | Status: DC | PRN

## 2017-05-12 MED ORDER — PRAVASTATIN SODIUM 40 MG PO TABS: 40.0000 mg | ORAL_TABLET | Freq: Every day | ORAL | 3 refills | Status: DC

## 2017-05-12 NOTE — Telephone Encounter (Signed)
rx for macrobid done hardcopy to [facility] as there is no local pharmacy of record

## 2017-05-12 NOTE — Assessment & Plan Note (Signed)
stable overall by history and exam, recent data reviewed with pt, and pt to continue medical treatment as before,  to f/u any worsening symptoms or concerns BP Readings from Last 3 Encounters:  05/12/17 104/68  01/25/17 122/70  11/22/16 118/61

## 2017-05-12 NOTE — Assessment & Plan Note (Signed)
stable overall by history and exam, recent data reviewed with pt, and pt to continue medical treatment as before,  to f/u any worsening symptoms or concerns Lab Results  Component Value Date   TSH 0.84 11/03/2016  for f/u lab

## 2017-05-12 NOTE — Progress Notes (Signed)
Subjective:    Patient ID: [name] [name], female    DOB: 07-01-1946, 71 y.o.   MRN: [id number]  HPI  Here for yearly f/u;  Overall doing ok;  Pt denies Chest pain, worsening SOB, DOE, wheezing, orthopnea, PND, worsening LE edema, palpitations, dizziness or syncope.  Pt denies neurological change such as new headache, facial or extremity weakness.  Pt denies polydipsia, polyuria, or low sugar symptoms. Pt states overall good compliance with treatment and medications, good tolerability, and has been trying to follow appropriate diet.  Pt denies worsening depressive symptoms, suicidal ideation or panic. No fever, night sweats, wt loss, loss of appetite, or other constitutional symptoms.  Pt states good ability with ADL's, has low fall risk, home safety reviewed and adequate, no other significant changes in hearing or vision, and not active with exercise.  Has pending neurology referral, likely to be able to be seen by late Nov, has ongoing balance and gait disorder, walks with rollater, still driving herself.  Has seen Dr Smith/sport med for right shoulder pain, tx with cortisone and excercises.  Still has some occasional pain now fro right shoulder to distal arm, s/p c-spine surgury c5-6.  Has known at least mild neuropathy to the hands and feet possibly releated to spine dz but also s/p chemotherapy.  Does have soreness over the right greater trochanter and is considering f/u with Dr Tamala Julian.  Is right handed, but has been using the left more due to shoulder pain, and now has more pain to the diffuse LUE, achy type.  Lives by herself.  Denies hyper or hypo thyroid symptoms such as voice, skin or hair change.   Due for pneumovax, flu shot. Past Medical History:  Diagnosis Date  . Anemia   . Asthma 06/13/2011   hx of  . Atrial fibrillation ([place])   . Atrial flutter ([place])   . Breast cancer ([place]) dx'd 2005   No BP check or stick in Left arm  . CAD (coronary artery disease)    a. Nonobstructive CAD 2007 (EF  75%.  RCA  proximal 75% tubular, 25% prox LAD, 25% d1, 50% D2) at [facility]. b. NSTEMI 01/2010 in setting of respiratory failure, medical approach (not a good candidate for noninvasive eval)  . Cellulitis    hx of cellulitis in left leg  . Cervical radiculopathy 06/13/2011  . COPD (chronic obstructive pulmonary disease) ([place])    on O2  . Depression 06/13/2011  . Diverticulosis   . Esophageal dysmotility   . Gout 06/13/2011  . History of Clostridium difficile early dec 2015   no current diarrhea  . History of home oxygen therapy    2 liters per nasal cannula all the time  . Hx of dysfunctional uterine bleeding    last bleeding jan 2016, worked up for with d and c x 2 no cause found  . Hyperlipidemia   . Hypertension   . Hypothyroidism   . Impaired glucose tolerance 11/01/2012  . Neuropathy 06/13/2011  . Obesity hypoventilation syndrome ([place]) 06/13/2011  . OSA on CPAP   . PFO (patent foramen ovale)   . Risk for falls   . Syncope and collapse    Bradycardia with pauses. S/P pacemaker  . Tubular adenoma of colon    Past Surgical History:  Procedure Laterality Date  . BREAST LUMPECTOMY Left 05/2004  . CARDIOVERSION N/A 03/15/2013   Procedure: CARDIOVERSION;  Surgeon: Thayer Headings, MD;  Location: [place];  Service: Cardiovascular;  Laterality: N/A;  .  CERVICAL BIOPSY  June 2013   at [facility] for Heavy  Bleeding  . [place] SURGERY  2004 or 2005   have cadaver bones,, screws, and plates c 5 to c 6  . COLONOSCOPY    . COLONOSCOPY WITH PROPOFOL N/A 10/22/2014   Procedure: COLONOSCOPY WITH PROPOFOL;  Surgeon: Jerene Bears, MD;  Location[facility] ENDOSCOPY;  Service: Gastroenterology;  Laterality: N/A;  . DILATION AND CURETTAGE OF UTERUS    . LAPAROSCOPIC APPENDECTOMY  03/29/2012   Procedure: APPENDECTOMY LAPAROSCOPIC;  Surgeon: Adin Hector, MD;  Location: [facility] ORS;  Service: General;  Laterality: N/A;  . LOOP RECORDER EXPLANT  08/31/2013   Procedure: LOOP RECORDER EXPLANT;  Surgeon: Coralyn Mark, MD;  Location: [facility] CATH LAB;  Service: Cardiovascular;;  . LOOP RECORDER IMPLANT  08-28-2013; 08-31-2013   MDT LinQ implanted by Dr Rayann Heman for syncope; explanted 08-31-2013 after sinus pauses identified  . LOOP RECORDER IMPLANT N/A 08/28/2013   Procedure: LOOP RECORDER IMPLANT;  Surgeon: Coralyn Mark, MD;  Location: [place] CATH LAB;  Service: Cardiovascular;  Laterality: N/A;  . OVARIAN CYST REMOVAL    . PACEMAKER INSERTION  08-31-2013   Biotronik Evera dual chamber pacemaker placed for neurocardiogenic syncope and sinus pauses by Dr Rayann Heman  . PERMANENT PACEMAKER INSERTION N/A 08/31/2013   Procedure: PERMANENT PACEMAKER INSERTION;  Surgeon: Coralyn Mark, MD;  Location: [place] CATH LAB;  Service: Cardiovascular;  Laterality: N/A;  . TEE WITHOUT CARDIOVERSION N/A 03/15/2013   Procedure: TRANSESOPHAGEAL ECHOCARDIOGRAM (TEE);  Surgeon: Thayer Headings, MD;  Location: [facility] ENDOSCOPY;  Service: Cardiovascular;  Laterality: N/A;    reports that she quit smoking about 18 years ago. Her smoking use included Cigarettes. She has a 20.00 pack-year smoking history. She has never used smokeless tobacco. She reports that she drinks about 0.6 oz of alcohol per week . She reports that she does not use drugs. family history includes Breast cancer in her maternal aunt; Breast cancer (age of onset: 26) in her sister; Breast cancer (age of onset: 32) in her cousin; Diabetes in her brother; Heart disease in her mother; Ovarian cancer in her sister and sister; Uterine cancer in her mother. Allergies  Allergen Reactions  . Ciprofloxacin Other (See Comments)    foliculitis  . Cephalexin Other (See Comments)    Exfoliative dermatitis reaction  . Codeine     REACTION: unknown - pt. states unaware of having reaction to codeine  . Dilaudid [Hydromorphone] Other (See Comments)    Other Reaction: Oversedation  . Lisinopril     REACTION: cough  . Penicillins     rash  . Cleocin [Clindamycin Hcl] Rash    rash  .  Doxycycline Rash   Current Outpatient Prescriptions on File Prior to Visit  Medication Sig Dispense Refill  . acetaminophen (TYLENOL) 325 MG tablet Take as needed per bottle    . allopurinol (ZYLOPRIM) 300 MG tablet TAKE 1 TABLET DAILY 90 tablet 0  . amitriptyline (ELAVIL) 100 MG tablet Take 1 tablet (100 mg total) by mouth at bedtime. 90 tablet 1  . budesonide-formoterol (SYMBICORT) 160-4.5 MCG/ACT inhaler Inhale 2 puffs into the lungs 2 (two) times daily. 3 Inhaler 3  . Cholecalciferol (VITAMIN D) 2000 UNITS tablet Take 2,000 Units by mouth at bedtime.    . clonazePAM (KLONOPIN) 0.5 MG tablet Take 1 tablet (0.5 mg total) by mouth 2 (two) times daily as needed for anxiety. 60 tablet 2  . DULoxetine (CYMBALTA) 30 MG capsule Take 30 mg  by mouth daily.    [name] ELIQUIS 5 MG TABS tablet TAKE 1 TABLET TWICE A DAY 180 tablet 3  . famotidine (PEPCID) 20 MG tablet Take 20 mg by mouth at bedtime.    . furosemide (LASIX) 40 MG tablet Take 2 tablets (80 mg total) by mouth 2 (two) times daily. 360 tablet 3  . levothyroxine (SYNTHROID, LEVOTHROID) 200 MCG tablet Take 200 mcg by mouth daily.    . metoprolol tartrate (LOPRESSOR) 25 MG tablet Take 1 tablet (25 mg total) by mouth 2 (two) times daily. F/u appt due in August must see MD for future reflls 180 tablet 0  . omeprazole (PRILOSEC) 40 MG capsule Take 1 capsule (40 mg total) by mouth daily. 90 capsule 3  . OXYGEN 2lpm 24/7    . potassium chloride SA (K-DUR,KLOR-CON) 20 MEQ tablet TAKE 1 TABLET TWICE A DAY 180 tablet 3  . PROAIR HFA 108 (90 Base) MCG/ACT inhaler Inhale 2 puffs into the lungs every 4 (four) hours as needed for wheezing or shortness of breath.     [name] UNABLE TO FIND Med Name:Cpap at night    . vitamin C (ASCORBIC ACID) 500 MG tablet Take 500 mg by mouth daily.      No current facility-administered medications on file prior to visit.    Review of Systems  Constitutional: Negative for other unusual diaphoresis or sweats HENT: Negative for ear  discharge or swelling Eyes: Negative for other worsening visual disturbances Respiratory: Negative for stridor or other swelling  Gastrointestinal: Negative for worsening distension or other blood Genitourinary: Negative for retention or other urinary change Musculoskeletal: Negative for other MSK pain or swelling Skin: Negative for color change or other new lesions Neurological: Negative for worsening tremors and other numbness  Psychiatric/Behavioral: Negative for worsening agitation or other fatigue All other system neg per pt    Objective:   Physical Exam BP 104/68   Pulse 84   Temp 98.3 F (36.8 C) (Oral)   Ht 5' 5.5" (1.664 m)   Wt (!) 304 lb 0.4 oz (137.9 kg)   SpO2 99% Comment: [place] O2 @ 2L  BMI 49.82 kg/m  VS noted, obese, in colorful outfit today and hat, positive attitude Constitutional: Pt is oriented to person, place, and time. Appears well-developed and well-nourished, in no significant distress and comfortable Head: Normocephalic and atraumatic  Eyes: Conjunctivae and EOM are normal. Pupils are equal, round, and reactive to light Right Ear: External ear normal without discharge Left Ear: External ear normal without discharge Nose: Nose without discharge or deformity Mouth/Throat: Oropharynx is without other ulcerations and moist  Neck: Normal range of motion. Neck supple. No JVD present. No tracheal deviation present or significant neck LA or mass Cardiovascular: Normal rate, regular rhythm, normal heart sounds and intact distal pulses.   Pulmonary/Chest: WOB normal and breath sounds decreased without rales or wheezing  Abdominal: Soft. Bowel sounds are normal. NT. No HSM  Musculoskeletal: Normal range of motion. Exhibits minimal trace bilat pedal edema Lymphadenopathy: Has no other cervical adenopathy.  Neurological: Pt is alert and oriented to person, place, and time. Pt has normal reflexes. No cranial nerve deficit. Motor grossly intact, o/w not done in  detail Skin: Skin is warm and dry. No rash noted or new ulcerations Psychiatric:  Has mild nervous mood and affect. Behavior is normal without agitation No other exam findings Lab Results  Component Value Date   WBC 4.3 09/21/2016   HGB 13.2 09/21/2016   HCT 40.7 09/21/2016  PLT 172.0 09/21/2016   GLUCOSE 91 11/15/2016   CHOL 172 05/06/2015   TRIG 153.0 (H) 05/06/2015   HDL 44.30 05/06/2015   LDLCALC 97 05/06/2015   ALT 16 09/02/2016   AST 26 09/02/2016   NA 143 11/15/2016   K 3.7 11/15/2016   CL 99 11/15/2016   CREATININE 0.82 11/15/2016   BUN 7 11/15/2016   CO2 33 (H) 11/15/2016   TSH 0.84 11/03/2016   INR 1.08 09/02/2016   HGBA1C 5.5 05/06/2015   Transthoracic Echocardiography - summary Study Date: 11/04/2016 Study Conclusions  - Left ventricle: The cavity size was normal. Wall thickness was   increased in a pattern of mild LVH. Systolic function was normal.   The estimated ejection fraction was in the range of 55% to 60%.   Wall motion was normal; there were no regional wall motion   abnormalities. Features are consistent with a pseudonormal left   ventricular filling pattern, with concomitant abnormal relaxation   and increased filling pressure (grade 2 diastolic dysfunction). - Aortic valve: There was mild to moderate regurgitation. - Mitral valve: Valve area by continuity equation (using LVOT   flow): 1.88 cm^2.    Assessment & Plan:

## 2017-05-12 NOTE — Telephone Encounter (Signed)
Please resend script to [facility] [address]

## 2017-05-12 NOTE — Telephone Encounter (Signed)
Patient's prescription for macrobid was left at our office today---seen today by dr john---I called and left message advising patient that I have called macrobid (antibiotic) into her local pharm, [facility]---patient needs to call us back if she wants this rx sent somewhere else---original printed rx is on tamara's desk if she wants this sent somewhere else

## 2017-05-12 NOTE — Patient Instructions (Addendum)
You had the flu shot today, and the Pneumovax pneumonia shot today  Please continue all other medications as before, and refills have been done if requested - the pravastatin and pain medication  Please have the pharmacy call with any other refills you may need.  Please continue your efforts at being more active, low cholesterol diet, and weight control.  You are otherwise up to date with prevention measures today.  Please keep your appointments with your specialists as you may have planned  Please go to the LAB in the Basement (turn left off the elevator) for the tests to be done today  You will be contacted by phone if any changes need to be made immediately.  Otherwise, you will receive a letter about your results with an explanation, but please check with MyChart first.  Please remember to sign up for MyChart if you have not done so, as this will be important to you in the future with finding out test results, communicating by private email, and scheduling acute appointments online when needed.  Please return in 6 months, or sooner if needed

## 2017-05-12 NOTE — Assessment & Plan Note (Signed)
stable overall by history and exam, recent data reviewed with pt, and pt to continue medical treatment as before,  to f/u any worsening symptoms or concerns Lab Results  Component Value Date   LDLCALC 97 05/06/2015

## 2017-05-12 NOTE — Assessment & Plan Note (Signed)
stable overall by history and exam, recent data reviewed with pt, and pt to continue medical treatment as before,  to f/u any worsening symptoms or concerns Lab Results  Component Value Date   HGBA1C 5.5 05/06/2015

## 2017-05-12 NOTE — Telephone Encounter (Signed)
rx faxed to [facility]

## 2017-05-13 NOTE — Telephone Encounter (Signed)
Pt was notified yesterday see lab encounter...[name] [name]

## 2017-05-17 ENCOUNTER — Telehealth: Payer: Self-pay | Admitting: Internal Medicine

## 2017-05-17 ENCOUNTER — Other Ambulatory Visit: Payer: Self-pay | Admitting: Internal Medicine

## 2017-05-17 NOTE — Telephone Encounter (Signed)
Pt called in and needs a refill on her clonazePAM (KLONOPIN) 0.5 MG tablet [[id number]]   [facility] on [address]

## 2017-05-18 MED ORDER — CLONAZEPAM 0.5 MG PO TABS: 0.5000 mg | ORAL_TABLET | Freq: Two times a day (BID) | ORAL | 2 refills | Status: DC | PRN

## 2017-05-18 NOTE — Telephone Encounter (Signed)
Faxed

## 2017-05-18 NOTE — Telephone Encounter (Signed)
Done hardcopy to Shirron  

## 2017-05-18 NOTE — Telephone Encounter (Signed)
Pt notified of fax script.

## 2017-05-20 ENCOUNTER — Encounter: Payer: Self-pay | Admitting: Cardiology

## 2017-06-03 ENCOUNTER — Encounter: Payer: Self-pay | Admitting: Cardiology

## 2017-06-07 ENCOUNTER — Ambulatory Visit: Payer: Medicare Other | Admitting: Hematology

## 2017-06-08 ENCOUNTER — Encounter: Payer: Self-pay | Admitting: Hematology

## 2017-06-08 ENCOUNTER — Ambulatory Visit (HOSPITAL_BASED_OUTPATIENT_CLINIC_OR_DEPARTMENT_OTHER): Payer: Medicare Other | Admitting: Hematology

## 2017-06-08 ENCOUNTER — Telehealth: Payer: Self-pay | Admitting: Hematology

## 2017-06-08 VITALS — BP 128/59 | HR 103 | Temp 98.1°F | Resp 18 | Ht 65.5 in | Wt 305.1 lb

## 2017-06-08 DIAGNOSIS — I2510 Atherosclerotic heart disease of native coronary artery without angina pectoris: Secondary | ICD-10-CM | POA: Diagnosis not present

## 2017-06-08 DIAGNOSIS — R921 Mammographic calcification found on diagnostic imaging of breast: Secondary | ICD-10-CM | POA: Diagnosis not present

## 2017-06-08 DIAGNOSIS — J449 Chronic obstructive pulmonary disease, unspecified: Secondary | ICD-10-CM

## 2017-06-08 DIAGNOSIS — I4891 Unspecified atrial fibrillation: Secondary | ICD-10-CM | POA: Diagnosis not present

## 2017-06-08 DIAGNOSIS — M545 Low back pain: Secondary | ICD-10-CM | POA: Diagnosis not present

## 2017-06-08 DIAGNOSIS — Z853 Personal history of malignant neoplasm of breast: Secondary | ICD-10-CM | POA: Diagnosis not present

## 2017-06-08 NOTE — Telephone Encounter (Signed)
Scheduled appt per 9/19 los - f/u in one year - reminder letter sent in the mail.

## 2017-06-08 NOTE — Progress Notes (Signed)
[place] OFFICE PROGRESS NOTE   Biagio Borg, MD [place] [address]  DIAGNOSIS: History of left breast cancer - Plan: CBC with Differential, MM DIAG BREAST TOMO BILATERAL  Chief Complaint  Patient presents with  . Follow-up  . Breast Cancer       CURRENT THERAPY: Surveillance  INTERVAL HISTORY:  [name] [name] 71 y.o. female with history of infiltrating ductal carcinoma of the left breast (August 2006) who received all of her care for her breast cancer at [facility] is here for followup.  She was last seen by me one year ago. Mammogram on 11/23/2016 showed probably benign left breast calcifications and left breast asymmetry with recommended f/u in 1 year. DEXA on 11/23/2016 revealed T-score of -1.2 at right femur neck which indicates osteopenia.   She has been feeling well, no changes or problems. No hospitalizations. She had recent UTI that resolved. She was able to come off oxygen therapy for a year or two but has resumed, now with nasal cannula. She could not maintian >90% O2 sat. She noticed "getting winded" with activity. She returned to her pulmonologist and oxygen was resumed. She has COPD, on symbicort and inhaler. She remains active, doing her own housework. She also f/u with cardiologist Dr. Stanford Breed, has a pacemaker and CHF; diuretic dose has increased to 80 mg BID in May 2018.    MEDICAL HISTORY: Past Medical History:  Diagnosis Date  . Anemia   . Asthma 06/13/2011   hx of  . Atrial fibrillation ([place])   . Atrial flutter ([place])   . Breast cancer ([place]) dx'd 2005   No BP check or stick in Left arm  . CAD (coronary artery disease)    a. Nonobstructive CAD 2007 (EF 75%.  RCA  proximal 75% tubular, 25% prox LAD, 25% d1, 50% D2) at [facility]. b. NSTEMI 01/2010 in setting of respiratory failure, medical approach (not a good candidate for noninvasive eval)  . Cellulitis    hx of cellulitis in left leg  . Cervical radiculopathy 06/13/2011  . COPD  (chronic obstructive pulmonary disease) ([place])    on O2  . Depression 06/13/2011  . Diverticulosis   . Esophageal dysmotility   . Gout 06/13/2011  . History of Clostridium difficile early dec 2015   no current diarrhea  . History of home oxygen therapy    2 liters per nasal cannula all the time  . Hx of dysfunctional uterine bleeding    last bleeding jan 2016, worked up for with d and c x 2 no cause found  . Hyperlipidemia   . Hypertension   . Hypothyroidism   . Impaired glucose tolerance 11/01/2012  . Neuropathy 06/13/2011  . Obesity hypoventilation syndrome ([place]) 06/13/2011  . OSA on CPAP   . PFO (patent foramen ovale)   . Risk for falls   . Syncope and collapse    Bradycardia with pauses. S/P pacemaker  . Tubular adenoma of colon     ALLERGIES:  is allergic to ciprofloxacin; cephalexin; codeine; dilaudid [hydromorphone]; lisinopril; penicillins; cleocin [clindamycin hcl]; and doxycycline.  MEDICATIONS: Current Outpatient Prescriptions on File Prior to Visit  Medication Sig Dispense Refill  . acetaminophen (TYLENOL) 325 MG tablet Take as needed per bottle    . allopurinol (ZYLOPRIM) 300 MG tablet TAKE 1 TABLET DAILY 90 tablet 1  . amitriptyline (ELAVIL) 100 MG tablet Take 1 tablet (100 mg total) by mouth at bedtime. 90 tablet 1  .  budesonide-formoterol (SYMBICORT) 160-4.5 MCG/ACT inhaler Inhale 2 puffs into the lungs 2 (two) times daily. 3 Inhaler 3  . Cholecalciferol (VITAMIN D) 2000 UNITS tablet Take 2,000 Units by mouth at bedtime.    . clonazePAM (KLONOPIN) 0.5 MG tablet Take 1 tablet (0.5 mg total) by mouth 2 (two) times daily as needed for anxiety. 60 tablet 2  . DULoxetine (CYMBALTA) 30 MG capsule Take 30 mg by mouth daily.    [name] ELIQUIS 5 MG TABS tablet TAKE 1 TABLET TWICE A DAY 180 tablet 3  . famotidine (PEPCID) 20 MG tablet Take 20 mg by mouth at bedtime.    . furosemide (LASIX) 40 MG tablet Take 2 tablets (80 mg total) by mouth 2 (two) times daily. 360 tablet 3  .  levothyroxine (SYNTHROID, LEVOTHROID) 200 MCG tablet Take 200 mcg by mouth daily.    . metoprolol tartrate (LOPRESSOR) 25 MG tablet Take 1 tablet (25 mg total) by mouth 2 (two) times daily. F/u appt due in August must see MD for future reflls 180 tablet 0  . omeprazole (PRILOSEC) 40 MG capsule Take 1 capsule (40 mg total) by mouth daily. 90 capsule 3  . OXYGEN 2lpm 24/7    . potassium chloride SA (K-DUR,KLOR-CON) 20 MEQ tablet TAKE 1 TABLET TWICE A DAY 180 tablet 3  . pravastatin (PRAVACHOL) 40 MG tablet Take 1 tablet (40 mg total) by mouth daily. <PLEASE MAKE APPOINTMENT FOR REFILLS> 90 tablet 3  . PROAIR HFA 108 (90 Base) MCG/ACT inhaler Inhale 2 puffs into the lungs every 4 (four) hours as needed for wheezing or shortness of breath.     . traMADol (ULTRAM) 50 MG tablet Take 1 tablet (50 mg total) by mouth every 8 (eight) hours as needed (pain). 90 tablet 5  . UNABLE TO FIND Med Name:Cpap at night    . vitamin C (ASCORBIC ACID) 500 MG tablet Take 500 mg by mouth daily.      No current facility-administered medications on file prior to visit.     SURGICAL HISTORY:  Past Surgical History:  Procedure Laterality Date  . BREAST LUMPECTOMY Left 05/2004  . CARDIOVERSION N/A 03/15/2013   Procedure: CARDIOVERSION;  Surgeon: Thayer Headings, MD;  Location: [facility] ENDOSCOPY;  Service: Cardiovascular;  Laterality: N/A;  . CERVICAL BIOPSY  June 2013   at [facility] for Heavy  Bleeding  . [place] SURGERY  2004 or 2005   have cadaver bones,, screws, and plates c 5 to c 6  . COLONOSCOPY    . COLONOSCOPY WITH PROPOFOL N/A 10/22/2014   Procedure: COLONOSCOPY WITH PROPOFOL;  Surgeon: Jerene Bears, MD;  Location: [facility] ENDOSCOPY;  Service: Gastroenterology;  Laterality: N/A;  . DILATION AND CURETTAGE OF UTERUS    . LAPAROSCOPIC APPENDECTOMY  03/29/2012   Procedure: APPENDECTOMY LAPAROSCOPIC;  Surgeon: Adin Hector, MD;  Location: [facility] ORS;  Service: General;  Laterality: N/A;  . LOOP RECORDER EXPLANT  08/31/2013    Procedure: LOOP RECORDER EXPLANT;  Surgeon: Coralyn Mark, MD;  Location: [facility] CATH LAB;  Service: Cardiovascular;;  . LOOP RECORDER IMPLANT  08-28-2013; 08-31-2013   MDT LinQ implanted by Dr Rayann Heman for syncope; explanted 08-31-2013 after sinus pauses identified  . LOOP RECORDER IMPLANT N/A 08/28/2013   Procedure: LOOP RECORDER IMPLANT;  Surgeon: Coralyn Mark, MD;  Location: [facility] CATH LAB;  Service: Cardiovascular;  Laterality: N/A;  . OVARIAN CYST REMOVAL    . PACEMAKER INSERTION  08-31-2013   Biotronik Evera dual chamber pacemaker placed for  neurocardiogenic syncope and sinus pauses by Dr Rayann Heman  . PERMANENT PACEMAKER INSERTION N/A 08/31/2013   Procedure: PERMANENT PACEMAKER INSERTION;  Surgeon: Coralyn Mark, MD;  Location: [place] CATH LAB;  Service: Cardiovascular;  Laterality: N/A;  . TEE WITHOUT CARDIOVERSION N/A 03/15/2013   Procedure: TRANSESOPHAGEAL ECHOCARDIOGRAM (TEE);  Surgeon: Thayer Headings, MD;  Location: [place];  Service: Cardiovascular;  Laterality: N/A;    REVIEW OF SYSTEMS:   Constitutional: Denies fevers, chills or abnormal weight loss Eyes: Denies blurriness of vision Ears, nose, mouth, throat, and face: Denies mucositis or sore throat Respiratory: Denies cough or wheezes (+) dyspnea on exertion, on O2 via [place] Cardiovascular: Denies palpitation, chest discomfort (+)  lower extremity swelling, lasix 80 mg BID per cardiologist  Gastrointestinal:  Denies nausea, heartburn or change in bowel habits Skin: Denies abnormal skin rashes Lymphatics: Denies new lymphadenopathy or easy bruising Neurological:She reports numbness, tingling of her hands and feets but denies new weaknesses Behavioral/Psych: Mood is stable, no new changes  All other systems were reviewed with the patient and are negative.  PHYSICAL EXAMINATION: ECOG PERFORMANCE STATUS: 3  BP (!) 128/59 (BP Location: Right Arm, Patient Position: Sitting) Comment: nurse aware  Pulse (!) 103   Temp 98.1 F (36.7 C)  (Oral)   Resp 18   Ht 5' 5.5" (1.664 m)   Wt (!) 305 lb 1.6 oz (138.4 kg)   SpO2 100% Comment: patient on 2 liters  BMI 50.00 kg/m  GENERAL:alert, no distress and comfortable; Morbidly obese, on [place] oxygen  SKIN: skin color, texture, turgor are normal, no rashes or significant lesions EYES: normal, Conjunctiva are pink and non-injected, sclera clear OROPHARYNX:no exudate, no erythema and lips, buccal mucosa, and tongue normal  NECK: supple, thyroid normal size, non-tender, without nodularity LYMPH:  no palpable lymphadenopathy in the cervical, axillary or supraclavicular LUNGS: clear to auscultation and percussion with normal breathing effort HEART: regular rate & rhythm and no murmurs and chronic pitting extremity edema BREASTS: Large, right breast is benign, no palpable mass.  Left breast shows the effects of radiation with skin changes and palpable scare tissue to left upper inner quadrant at surgical incision but no suspicious finding suggestive of recurrence.  No axillary adenopathy.  ABDOMEN:abdomen soft, non-tender and normal bowel sounds Musculoskeletal:no cyanosis of digits and no clubbing  NEURO: alert & oriented x 3 with fluent speech, no focal motor/sensory deficits  Labs:  CBC Latest Ref Rng & Units 05/12/2017 09/21/2016 09/02/2016  WBC 4.0 - 10.5 K/uL 5.7 4.3 6.7  Hemoglobin 12.0 - 15.0 g/dL 13.4 13.2 13.0  Hematocrit 36.0 - 46.0 % 41.5 40.7 40.7  Platelets 150.0 - 400.0 K/uL 171.0 172.0 171    CMP Latest Ref Rng & Units 05/12/2017 11/15/2016 11/03/2016  Glucose 70 - 99 mg/dL 94 91 95  BUN 6 - 23 mg/dL 10 7 14   Creatinine 0.40 - 1.20 mg/dL 0.96 0.82 0.92  Sodium 135 - 145 mEq/L 141 143 142  Potassium 3.5 - 5.1 mEq/L 4.1 3.7 3.9  Chloride 96 - 112 mEq/L 100 99 98  CO2 19 - 32 mEq/L 35(H) 33(H) 35(H)  Calcium 8.4 - 10.5 mg/dL 10.0 9.0 9.5  Total Protein 6.0 - 8.3 g/dL 8.0 - -  Total Bilirubin 0.2 - 1.2 mg/dL 0.6 - -  Alkaline Phos 39 - 117 U/L 96 - -  AST 0 - 37 U/L 19 -  -  ALT 0 - 35 U/L 18 - -     RADIOGRAPHIC STUDIES: Diagnostic left breast  mammogram IMPRESSION: 1. Several loosely grouped punctate and coarse calcifications within the inner left breast at the surgical site, morphology better appreciated on the CC projection. These are probably benign calcifications for which a six-month follow-up left breast diagnostic mammogram, with magnification views, is recommended. 2. Left breast retroareolar asymmetry compatible with superimposition of normal dense fibroglandular tissues on today's additional views with tomosynthesis. Attention to this area also recommended on the six-month follow-up exam to ensure stability.  RECOMMENDATION: Follow-up left breast diagnostic mammogram, with possible ultrasound, in 6 months.  PATHOLOGY: Diagnosis Endometrium, biopsy - DEGENERATING SECRETORY-TYPE ENDOMETRIUM. - THERE IS NO EVIDENCE OF HYPERPLASIA OR MALIGNANCY. - SEE COMMENT. Microscopic Comment This pattern can be associated with menses, irregular shedding or breakthrough bleeding associated with hormonal therapy. Clinical correlation is suggested. (JBK:caf 05/14/13) Enid Cutter MD Pathologist, Electronic Signature (Case signed 05/14/2013)   RADIOLOGY REPORTS: MM DIAG BREAST TOMO UNI LEFT 12/02/2015 IMPRESSION: Unchanged probably benign left breast asymmetry and left breast calcifications. There are no mammographic findings of malignancy in the left breast.  RECOMMENDATION: Bilateral diagnostic mammography September 2017 to demonstrate 1 year stability of the probably benign left breast calcifications in left breast asymmetry  ASSESSMENT: Rich Number 71 y.o. female with a history of History of left breast cancer - Plan: CBC with Differential, MM DIAG BREAST TOMO BILATERAL  PLAN:  1. T2N0, stage IIA triple negative infiltrating ductal carcinoma of the left breast (2006).  --She is now over 10 years from the time of diagnosis without  evidence of disease.  -We discussed that the risk of cancer recurrence was triple-negative breast cancer is minimal now, but she is at risk for a second breast cancer and we will continue surveillance. -Her last mammogram in March 2018 showed probably benign left breast asymmetry and calcification.  -DEXA 11/2016 revealed T-score -1.2 at right femur neck, indicating osteopenia -Lab results discussed with her, unremarkable CBC -I encouraged her to continue healthy diet and a regular exercise. I also encouraged her to try to lose some weight. -I encouraged annual mammogram which will be due in 11/2017 and self breast exam or breast exam with her PCP every 6 months -She was given the option to be released from my practice but she prefers to f/u here annually, I will see her back in 1 year.    2. Left breast calcification -This was found on the screening mammogram in September 2016, and diagnostic mammogram, stable in 11/2015 and 11/2016 -She will have annual mammogram due 11/2017, ordered today  3. Chronic low back pain, atrial fibrillation, CAD, COPD on home oxygen, morbid obesity,  -She will continue follow-up with her primary care physician, cardiologist and pulmonologist   Plan  -f/u in 1 year -annual mammogram 11/2017, ordered today  All questions were answered. The patient knows to call the clinic with any problems, questions or concerns. We can certainly see the patient much sooner if necessary.  The patient was provided an after visit summary.   I spent 15 minutes counseling the patient face to face. The total time spent in the appointment was 25 minutes.  Truitt Merle  06/08/2017

## 2017-06-13 ENCOUNTER — Other Ambulatory Visit: Payer: Self-pay | Admitting: Internal Medicine

## 2017-06-17 ENCOUNTER — Other Ambulatory Visit: Payer: Self-pay | Admitting: Internal Medicine

## 2017-08-01 ENCOUNTER — Ambulatory Visit (INDEPENDENT_AMBULATORY_CARE_PROVIDER_SITE_OTHER): Payer: Medicare Other | Admitting: Neurology

## 2017-08-01 ENCOUNTER — Encounter: Payer: Self-pay | Admitting: Neurology

## 2017-08-01 VITALS — BP 120/78 | HR 93 | Ht 65.5 in | Wt 312.4 lb

## 2017-08-01 DIAGNOSIS — I2510 Atherosclerotic heart disease of native coronary artery without angina pectoris: Secondary | ICD-10-CM

## 2017-08-01 DIAGNOSIS — G620 Drug-induced polyneuropathy: Secondary | ICD-10-CM

## 2017-08-01 DIAGNOSIS — G5711 Meralgia paresthetica, right lower limb: Secondary | ICD-10-CM | POA: Diagnosis not present

## 2017-08-01 DIAGNOSIS — T451X5A Adverse effect of antineoplastic and immunosuppressive drugs, initial encounter: Secondary | ICD-10-CM | POA: Diagnosis not present

## 2017-08-01 NOTE — Progress Notes (Signed)
[name] Clinic Note - Initial Visit   Date: 08/01/17  [name] [name] MRN: [id number] DOB: 1945-12-23   Dear Dr. Jenny Reichmann:  Thank you for your kind referral of [name] [name] for consultation of right lateral thigh pain. Although her history is well known to you, please allow [place] to reiterate it for the purpose of our medical record. The patient was accompanied to the clinic by self.   History of Present Illness: [name] [name] is a 71 y.o. right-handed [place] American female with hyperlipidemia, hypertension, atrial fibrillation on Eliquis, GERD, hypothyroidism, left breast cancer s/p chemotherapy and radiation, s/p cervical ACDF C5-6, gout and OSA presenting for evaluation of left lateral thigh pain.    She complains of right lateral thigh numbness since 2017, which is constant.  She denies any associate weakness of the legs or similar symptoms on the right.  Her numbness and tingling does not extend past the knee or medially into the thigh.  She denies any associated low back pain.  She used to weight 365lb and has lost about 60lb over the past year.  She denies wearing tight constrictive clothing.  She has right frozen shoulder and complaints of achy pain over the shoulder and over the biceps muscle.  Pain is alleviated with cortisone steroid injections, which she sees Dr. Tamala Julian for.  She does not like getting frequent injections and is asking whether nerve impingement could be causing her discomfort.    In 2006, she was diagnosed with breast cancer and underwent chemotherapy and started having tingling and numbness of the feet and diagnosed with neuropathy.  One year prior to this, she had ACDF at C5-6 and has residual paresthesias of the hands.  She denies any weakness of the hands and feet.  She has been using a walker for support, but more to hold her oxygen tank when she is ambulatory. She takes amitriptyline 136m at bedtime and Cymbalta 354mdaily for  generalized pain.   She does not have history of diabetes or alcoholism.  Out-side paper records, electronic medical record, and images have been reviewed where available and summarized as:  Lab Results  Component Value Date   TSH 0.76 05/12/2017   Lab Results  Component Value Date   HGBA1C 5.6 05/12/2017     Past Medical History:  Diagnosis Date  . Anemia   . Asthma 06/13/2011   hx of  . Atrial fibrillation ([facility]  . Atrial flutter ([facility] [place]  . Breast cancer ([facility] 2005   No BP check or stick in Left arm  . CAD (coronary artery disease)    a. Nonobstructive CAD 2007 (EF 75%.  RCA  proximal 75% tubular, 25% prox LAD, 25% d1, 50% D2) [facility] NSTEMI 01/2010 in setting of respiratory failure, medical approach (not a good candidate for noninvasive eval)  . Cellulitis    hx of cellulitis in left leg  . Cervical radiculopathy 06/13/2011  . COPD (chronic obstructive pulmonary disease) ([facility]   on O2  . Depression 06/13/2011  . Diverticulosis   . Esophageal dysmotility   . Gout 06/13/2011  . History of Clostridium difficile early dec 2015   no current diarrhea  . History of home oxygen therapy    2 liters per nasal cannula all the time  . Hx of dysfunctional uterine bleeding    last bleeding jan 2016, worked up for with d and c x 2 no cause found  . Hyperlipidemia   .  Hypertension   . Hypothyroidism   . Impaired glucose tolerance 11/01/2012  . Neuropathy 06/13/2011  . Obesity hypoventilation syndrome ([place]) 06/13/2011  . OSA on CPAP   . PFO (patent foramen ovale)   . Risk for falls   . Syncope and collapse    Bradycardia with pauses. S/P pacemaker  . Tubular adenoma of colon     Past Surgical History:  Procedure Laterality Date  . BREAST LUMPECTOMY Left 05/2004  . CERVICAL BIOPSY  June 2013   at [facility] for Heavy  Bleeding  . [place] SURGERY  2004 or 2005   have cadaver bones,, screws, and plates c 5 to c 6  . COLONOSCOPY    . DILATION AND CURETTAGE OF UTERUS      . LOOP RECORDER IMPLANT  08-28-2013; 08-31-2013   MDT LinQ implanted by Dr Rayann Heman for syncope; explanted 08-31-2013 after sinus pauses identified  . OVARIAN CYST REMOVAL    . PACEMAKER INSERTION  08-31-2013   Biotronik Evera dual chamber pacemaker placed for neurocardiogenic syncope and sinus pauses by Dr Rayann Heman     Medications:  Outpatient Encounter Medications as of 08/01/2017  Medication Sig Note  . acetaminophen (TYLENOL) 325 MG tablet Take as needed per bottle   . allopurinol (ZYLOPRIM) 300 MG tablet TAKE 1 TABLET DAILY   . amitriptyline (ELAVIL) 100 MG tablet TAKE 1 TABLET AT BEDTIME   . budesonide-formoterol (SYMBICORT) 160-4.5 MCG/ACT inhaler Inhale 2 puffs into the lungs 2 (two) times daily.   . Cholecalciferol (VITAMIN D) 2000 UNITS tablet Take 2,000 Units by mouth at bedtime.   . clonazePAM (KLONOPIN) 0.5 MG tablet Take 1 tablet (0.5 mg total) by mouth 2 (two) times daily as needed for anxiety.   . DULoxetine (CYMBALTA) 30 MG capsule Take 30 mg by mouth daily.   [name] Kitchen ELIQUIS 5 MG TABS tablet TAKE 1 TABLET TWICE A DAY   . famotidine (PEPCID) 20 MG tablet Take 20 mg by mouth at bedtime.   . furosemide (LASIX) 40 MG tablet Take 2 tablets (80 mg total) by mouth 2 (two) times daily.   [name] Kitchen levothyroxine (SYNTHROID, LEVOTHROID) 200 MCG tablet Take 200 mcg by mouth daily.   . metoprolol tartrate (LOPRESSOR) 25 MG tablet TAKE 1 TABLET TWICE A DAY (FOLLOW UP APPOINTMENT DUE IN AUGUST MUST SEE MD FOR FUTURE REFILLS)   . omeprazole (PRILOSEC) 40 MG capsule Take 1 capsule (40 mg total) by mouth daily.   . OXYGEN 2lpm 24/7   . potassium chloride SA (K-DUR,KLOR-CON) 20 MEQ tablet TAKE 1 TABLET TWICE A DAY   . pravastatin (PRAVACHOL) 40 MG tablet Take 1 tablet (40 mg total) by mouth daily. <PLEASE MAKE APPOINTMENT FOR REFILLS>   . PROAIR HFA 108 (90 Base) MCG/ACT inhaler Inhale 2 puffs into the lungs every 4 (four) hours as needed for wheezing or shortness of breath.  09/21/2016: Received from:  External Pharmacy  . traMADol (ULTRAM) 50 MG tablet Take 1 tablet (50 mg total) by mouth every 8 (eight) hours as needed (pain).   [name] Kitchen UNABLE TO FIND Med Name:Cpap at night   . vitamin C (ASCORBIC ACID) 500 MG tablet Take 500 mg by mouth daily.     No facility-administered encounter medications on file as of 08/01/2017.      Allergies:  Allergies  Allergen Reactions  . Ciprofloxacin Other (See Comments)    foliculitis  . Cephalexin Other (See Comments)    Exfoliative dermatitis reaction  . Codeine     REACTION: unknown -  pt. states unaware of having reaction to codeine  . Dilaudid [Hydromorphone] Other (See Comments)    Other Reaction: Oversedation  . Lisinopril     REACTION: cough  . Penicillins     rash  . Cleocin [Clindamycin Hcl] Rash    rash  . Doxycycline Rash    Family History: Family History  Problem Relation Age of Onset  . Uterine cancer Mother   . Heart disease Mother   . Breast cancer Sister 80  . Ovarian cancer Sister   . Breast cancer Cousin 22  . Diabetes Brother   . Breast cancer Maternal Aunt   . Ovarian cancer Sister     Social History: Social History   Tobacco Use  . Smoking status: Former Smoker    Packs/day: 1.00    Years: 20.00    Pack years: 20.00    Types: Cigarettes    Last attempt to quit: 09/20/1998    Years since quitting: 18.8  . Smokeless tobacco: Never Used  . Tobacco comment: 1ppd x 20 years  Substance Use Topics  . Alcohol use: Yes    Alcohol/week: 0.6 oz    Types: 1 Glasses of wine per week    Comment: rare  . Drug use: No   Social History   Social History Narrative   Lives alone in an apartment on the first floor.   Has 2 children.  Retired Advertising copywriter.  Education: some college.     Review of Systems:  CONSTITUTIONAL: No fevers, chills, night sweats, +weight loss.   EYES: No visual changes or eye pain ENT: No hearing changes.  No history of nose bleeds.   RESPIRATORY: No cough, wheezing +shortness of  breath.   CARDIOVASCULAR: Negative for chest pain, and palpitations.   GI: Negative for abdominal discomfort, blood in stools or black stools.  No recent change in bowel habits.   GU:  No history of incontinence.   MUSCLOSKELETAL: +history of joint pain or swelling.  +myalgias.   SKIN: Negative for lesions, rash, and itching.   HEMATOLOGY/ONCOLOGY: Negative for prolonged bleeding, bruising easily, and swollen nodes.  No history of cancer.   ENDOCRINE: Negative for cold or heat intolerance, polydipsia or goiter.   PSYCH:  No depression or anxiety symptoms.   NEURO: As Above.   Vital Signs:  BP 120/78   Pulse 93   Ht 5' 5.5" (1.664 m)   Wt (!) 312 lb 6 oz (141.7 kg)   SpO2 93%   BMI 51.19 kg/m    General Medical Exam:   General:  Well appearing, comfortable on 2L [place], obese.   Eyes/ENT: see cranial nerve examination.   Neck: No masses appreciated.  Full range of motion without tenderness.  No carotid bruits. Respiratory:  Clear to auscultation, good air entry bilaterally.   Cardiac:  Regular rate and rhythm, no murmur.   Extremities:  No deformities, edema, or skin discoloration.  Skin:  No rashes or lesions.  Neurological Exam: MENTAL STATUS including orientation to time, place, person, recent and remote memory, attention span and concentration, language, and fund of knowledge is normal.  Speech is not dysarthric.  CRANIAL NERVES: II:  No visual field defects.    III-IV-VI: Pupils equal round and reactive to light.  Normal conjugate, extra-ocular eye movements in all directions of gaze.  No nystagmus.  No ptosis.   V:  Normal facial sensation.   VII:  Normal facial symmetry and movements.    VIII:  Normal hearing and vestibular  function.   IX-X:  Normal palatal movement.   XI:  Normal shoulder shrug and head rotation.   XII:  Normal tongue strength and range of motion, no deviation or fasciculation.  MOTOR:  Motor strength is 5/5 throughout, including distally in the hands  and feet. Right shoulder ROM is restricted due to frozen shoulder/pain.   No pronator drift.  Tone is normal.    MSRs:  Right                                                                 Left brachioradialis 2+  brachioradialis 2+  biceps 2+  biceps 2+  triceps 2+  triceps 2+  patellar 2+  patellar 2+  ankle jerk 0  ankle jerk 0  Hoffman no  Hoffman no  plantar response down  plantar response down   SENSORY:  Vibration is mildly reduced at the great toe and pin prick over the distal feet.  Temperature is reduced over the right anterolateral thigh; pin prick causes hyperesthesias over the LCFN distribution.  COORDINATION/GAIT: Normal finger-to- nose-finger.  Intact rapid alternating movements bilaterally.  Gait is wide-based, assisted with walker.   IMPRESSION: 1.  Meralgia paresthetica. Based on history of exam, she has meralgia paresthetica an entrapment of the lateral femoral cutaneous nerve as it exits below the inguinal canal. Management is conservative with avoidance of repetitive trauma to this region and weight loss.  She was offered to use OTC lidocaine ointments for painful paresthesias, as needed.  2.  Right arm pain due to adhesive capsulitis.  Educated patient that her pain is not stemming from cervical nerve impingement and due to pathology intrinsic to the shoulder.  Continue follow-up with Dr. Tamala Julian for management.  3.  Chemotherapy induced neuropathy, stable on amitriptyline 164m qhs and Cymbalta 324mdaily.  She does not want to make any changes to her medication and is happy with current regimen.   All questions were answered.  Return to clinic as needed.   The duration of this appointment visit was 35 minutes of face-to-face time with the patient.  Greater than 50% of this time was spent in counseling, explanation of diagnosis, planning of further management, and coordination of care.   Thank you for allowing me to participate in patient's care.  If I can answer  any additional questions, I would be pleased to do so.    Sincerely,    Donika K. PaPosey ProntoDO

## 2017-08-01 NOTE — Patient Instructions (Signed)
It was great to see you today!  If you have any new neurological concerns, please come back and see me

## 2017-08-08 ENCOUNTER — Ambulatory Visit (INDEPENDENT_AMBULATORY_CARE_PROVIDER_SITE_OTHER): Payer: Medicare Other | Admitting: *Deleted

## 2017-08-08 DIAGNOSIS — I495 Sick sinus syndrome: Secondary | ICD-10-CM

## 2017-08-08 NOTE — Progress Notes (Signed)
Remote pacemaker transmission.   

## 2017-08-09 LAB — CUP PACEART REMOTE DEVICE CHECK
Date Time Interrogation Session: 20181120133307
Implantable Lead Implant Date: 20141212
Implantable Lead Location: 753859
Implantable Lead Location: 753860
Implantable Lead Model: 346
Implantable Lead Serial Number: 29409274
Implantable Lead Serial Number: 29535412
Implantable Pulse Generator Implant Date: 20141212
MDC IDC LEAD IMPLANT DT: 20141212
MDC IDC PG SERIAL: 68181424

## 2017-08-15 ENCOUNTER — Other Ambulatory Visit: Payer: Self-pay | Admitting: Internal Medicine

## 2017-08-18 ENCOUNTER — Encounter: Payer: Self-pay | Admitting: Cardiology

## 2017-08-26 ENCOUNTER — Telehealth: Payer: Self-pay | Admitting: Internal Medicine

## 2017-08-26 NOTE — Telephone Encounter (Signed)
Received CMN from [place] on patient's O2 2lpm w/ activity that was ordered by TP on 1.16.18 Pt was last seen 2.14.18 by MW and was recommended to follow up in 3 months Pt noshowed for her 5.18.18 appt with MW  Left detailed message on named VM informing pt of the above and asking pt to please call the office to schedule an appt with MW so that we (patient and office) will be in accordance with insurance guidelines.  Will sign off but route to myself to follow

## 2017-08-31 ENCOUNTER — Other Ambulatory Visit: Payer: Self-pay | Admitting: Internal Medicine

## 2017-08-31 ENCOUNTER — Telehealth: Payer: Self-pay | Admitting: Pulmonary Disease

## 2017-08-31 DIAGNOSIS — R7989 Other specified abnormal findings of blood chemistry: Secondary | ICD-10-CM

## 2017-08-31 NOTE — Telephone Encounter (Signed)
lmtcb for Amanda at [facility].  

## 2017-08-31 NOTE — Telephone Encounter (Signed)
Called spoke with patient (see 12.7.18 phone note) and scheduled her to see MW on 12.18.18 @ 0945 (pt sees RA for OSA and MW for her COPD)

## 2017-08-31 NOTE — Telephone Encounter (Signed)
Called spoke with patient Appt scheduled to see MW on 12.18.18 @ 0945 Nothing further needed; will sign  off

## 2017-09-06 ENCOUNTER — Ambulatory Visit (INDEPENDENT_AMBULATORY_CARE_PROVIDER_SITE_OTHER): Payer: Medicare Other | Admitting: Internal Medicine

## 2017-09-06 ENCOUNTER — Encounter: Payer: Self-pay | Admitting: Internal Medicine

## 2017-09-06 VITALS — BP 120/70 | HR 91 | Ht 65.5 in | Wt 295.2 lb

## 2017-09-06 DIAGNOSIS — J9611 Chronic respiratory failure with hypoxia: Secondary | ICD-10-CM

## 2017-09-06 DIAGNOSIS — I2510 Atherosclerotic heart disease of native coronary artery without angina pectoris: Secondary | ICD-10-CM | POA: Diagnosis not present

## 2017-09-06 DIAGNOSIS — J449 Chronic obstructive pulmonary disease, unspecified: Secondary | ICD-10-CM

## 2017-09-06 DIAGNOSIS — J9612 Chronic respiratory failure with hypercapnia: Secondary | ICD-10-CM | POA: Diagnosis not present

## 2017-09-06 MED ORDER — BUDESONIDE-FORMOTEROL FUMARATE 160-4.5 MCG/ACT IN AERO: 2.0000 | INHALATION_SPRAY | Freq: Two times a day (BID) | RESPIRATORY_TRACT | 0 refills | Status: DC

## 2017-09-06 MED ORDER — PROAIR HFA 108 (90 BASE) MCG/ACT IN AERS: 2.0000 | INHALATION_SPRAY | RESPIRATORY_TRACT | 4 refills | Status: DC | PRN

## 2017-09-06 NOTE — Patient Instructions (Addendum)
No need for 02 at rest - use 2lpm at bedtime and walkng   Plan A = Automatic = Symbicort 160 Take 2 puffs first thing in am and then another 2 puffs about 12 hours later.   Work on inhaler technique:  relax and gently blow all the way out then take a nice smooth deep breath back in, triggering the inhaler at same time you start breathing in.  Hold for up to 5 seconds if you can. Blow out thru nose. Rinse and gargle with water when done      Plan B = Backup Only use your albuterol as a rescue medication to be used if you can't catch your breath by resting or doing a relaxed purse lip breathing pattern.  - The less you use it, the better it will work when you need it. - Ok to use the inhaler up to 2 puffs  every 4 hours if you must but call for appointment if use goes up over your usual need - Don't leave home without it !!  (think of it like the spare tire for your car)     See calendar for specific medication instructions and bring it back for each and every office visit for every healthcare provider you see.  Without it,  you may not receive the best quality medical care that we feel you deserve.  You will note that the calendar groups together  your maintenance  medications that are timed at particular times of the day.  Think of this as your checklist for what your doctor has instructed you to do until your next evaluation to see what benefit  there is  to staying on a consistent group of medications intended to keep you well.  The other group at the bottom is entirely up to you to use as you see fit  for specific symptoms that may arise between visits that require you to treat them on an as needed basis.  Think of this as your action plan or "what if" list.   Separating the top medications from the bottom group is fundamental to providing you adequate care going forward.       See Tammy NP w/in 2 weeks or first available  with all your medications, even over the counter meds, separated in  two separate bags as above:   Tammy  will generate for you a new user friendly medication calendar that will put [place] all on the same page re: your medication use.

## 2017-09-06 NOTE — Progress Notes (Signed)
Subjective:    Patient ID: [name] [name], female    DOB: [birth year], .   MRN: [id number]  HPI PCP - Jenny Reichmann   71 yo  morbidly obese female quit smoking in 2000 for FU of copd & OSA on 10 cm.  PMH -sleep apnea, hypertension,breast cancer with left lumpectomy and hypothyroidism , dCHF , PFO    TEST :   Duplex neg DVT, CT angio neg PE, echo >> nml LV fn, mod LVH.   PFTs showed moderate airway obstruction FEV1 48%, no BD response, mild restriction, DLCO corrects for alveolar volume c/w obesity  PSG 03/30/10 surprisingly showed AHI 0.7/h & RDI 9/h, 36 RERAs over 249 mins TST, lowest desatn 92% on 2 L [place]  July 2011 -ONO showed significant desaturation for > 4h  ECHO TEE 03/15/13>EF ok, PFO +bubble  Echo 04/12/16 - Left ventricle: The cavity size was normal. There was mild   concentric hypertrophy. Systolic function was normal. The   estimated ejection fraction was in the range of 55% to 60%.   Doppler parameters are consistent with both elevated ventricular   end-diastolic filling pressure and elevated left atrial filling   pressure. - Aortic valve: There was mild to moderate regurgitation. - Mitral valve: Eccentric posteriorly directed MR. Severely   calcified annulus. There was moderate regurgitation. Valve area   by pressure half-time: 2.27 cm^2. - Left atrium: The atrium was moderately dilated. - Atrial septum: No defect or patent foramen ovale was identified. - Tricuspid valve: There was moderate regurgitation. - Pulmonary arteries: PA peak pressure: 67 mm Hg (S).    06/11/2016 NP  Follow up : OSA /OHS  Pt returns for 4 month follow up .  She says she  Is doing well on CPAP .  Wearing CPAP At bedtime  , wears for 8hr each night.  Feels rested. Says she is doing very well, feels best in long time.  Download shows excellent compliance with average usage at 8.5 hours. She is on a set pressure at 10 cm of H2O. AHI 0.3. Leaks. Minimum. She is now with [facility] . Getting better customer  service.  She denies any chest pain, orthopnea, PND, or increased leg swelling Has some moderate Moderate COPD , previously on Symbicort and spriiva did not seem to make any difference. Does not like to take a lot of meds esp if they are not helping.  Uses ventolin on occasion . No flare of cough, wheeizng or dyspnea.  rec Continue on CPAP At bedtime  -keep up good work  Work on weight loss.  Do not drive if sleepy.  Flu shot today  Activity as tolerated.  Ventolin As needed  For wheezing/shortness of breathing .  Follow up Dr. Elsworth Soho  In  6 months  and As needed     09/21/2016 acute extended ov/Wert re: COPD GOLD III/ no maint / cpap no 02  Chief Complaint  Patient presents with  . Acute Visit    Pt c/o increased SOB and congestion- this has been going on off and on for the past 3 months. She states she can not perform daily activities due to SOB. She has cough with clear sputum.  She states she has had swelling in her right leg- recently txed for cellulitis.   since flu shot more noted more doe assoc with  dry cough > no better with ventolin but hfa poor  Breathing overall  worse x 2 weeks (but not worse when seen in ER 270350  for itchy rash)   Voice is very hoarse  Ok at hs cpap and no noct or early am resp symptoms/ excess/ purulent sputum or mucus plugs rec Plan A = Automatic = Symbicort 160 Take 2 puffs first thing in am and then another 2 puffs about 12 hours later.  Work on inhaler technique:   Plan B = Backup Only use your albuterol as a rescue medication Please remember to go to the lab  department downstairs for your tests - we will call you with the results when they are available. Try [id number]  Take 30-60 min before first meal of the day and add  Pepcid ac (famotidine) 20 mg one @  bedtime until cough is completely gone for at least a week without the need for cough suppression GERD diet    11/03/2016  f/u ov/Wert re: GOLD II copd / symb 160 2bid / proair rarely needed   2lpm 24/7  Chief Complaint  Patient presents with  . Follow-up    Breathing has improved some. She has been using o2 24/7.    Can now do housework on 02 2lpm / 4 wheel rolling walker with brakes  Even on 02 MMRC3 = can't walk 100 yards even at a slow pace at a flat grade s stopping due to sob  Even on 02  REC See calendar for specific medication instructions  > not using as of 09/06/2017    09/06/2017  f/u ov/Wert re:  GOLD II copd / new start on 02 24/7 since last ov  Chief Complaint  Patient presents with  . Follow-up    Needing to be recertified for o2. She is currently using 2lpm 24/7.  She states her breathing has been worse since she ran out of her symbicort approx 1 wk ago. She states she also ran out of her rescue inhaler but feels she has not needed it.   def better on symb than off / confused with meds again  Doe still = MMRC3 = can't walk 100 yards even at a slow pace at a flat grade s stopping due to sob    No obvious day to day or daytime variability or assoc excess/ purulent sputum or mucus plugs or hemoptysis or cp or chest tightness, subjective wheeze or overt sinus or hb symptoms. No unusual exposure hx or h/o childhood pna/ asthma or knowledge of premature birth.  Sleeping ok flat without nocturnal  or early am exacerbation  of respiratory  c/o's or need for noct saba. Also denies any obvious fluctuation of symptoms with weather or environmental changes or other aggravating or alleviating factors except as outlined above   Current Allergies, Complete Past Medical History, Past Surgical History, Family History, and Social History were reviewed in [employer]eliant Energyrecord.  ROS  The following are not active complaints unless bolded Hoarseness, sore throat, dysphagia, dental problems, itching, sneezing,  nasal congestion or discharge of excess mucus or purulent secretions, ear ache,   fever, chills, sweats, unintended wt loss or wt gain, classically pleuritic  or exertional cp,  orthopnea pnd or leg swelling, presyncope, palpitations, abdominal pain, anorexia, nausea, vomiting, diarrhea  or change in bowel habits or change in bladder habits, change in stools or change in urine, dysuria, hematuria,  rash, arthralgias, visual complaints, headache, numbness, weakness or ataxia or problems with walking or coordination,  change in mood/affect or memory.        Current Meds  Medication Sig  . acetaminophen (TYLENOL) 325  MG tablet Take as needed per bottle  . allopurinol (ZYLOPRIM) 300 MG tablet TAKE 1 TABLET DAILY  . amitriptyline (ELAVIL) 100 MG tablet TAKE 1 TABLET AT BEDTIME  . Cholecalciferol (VITAMIN D) 2000 UNITS tablet Take 2,000 Units by mouth at bedtime.  . clonazePAM (KLONOPIN) 0.5 MG tablet Take 1 tablet (0.5 mg total) by mouth 2 (two) times daily as needed for anxiety.  . DULoxetine (CYMBALTA) 30 MG capsule Take 30 mg by mouth daily.  [name] ELIQUIS 5 MG TABS tablet TAKE 1 TABLET TWICE A DAY  . famotidine (PEPCID) 20 MG tablet Take 20 mg by mouth at bedtime.  . furosemide (LASIX) 40 MG tablet TAKE 1 TABLET EVERY MORNING AND 1 TABLET EVERY OTHER NIGHT  . levothyroxine (SYNTHROID, LEVOTHROID) 200 MCG tablet TAKE 1 TABLET DAILY  . metoprolol tartrate (LOPRESSOR) 25 MG tablet TAKE 1 TABLET TWICE A DAY (FOLLOW UP APPOINTMENT DUE IN AUGUST MUST SEE MD FOR FUTURE REFILLS)  . omeprazole (PRILOSEC) 40 MG capsule Take 1 capsule (40 mg total) by mouth daily.  . OXYGEN 2lpm 24/7  . potassium chloride SA (K-DUR,KLOR-CON) 20 MEQ tablet TAKE 1 TABLET TWICE A DAY  . pravastatin (PRAVACHOL) 40 MG tablet Take 1 tablet (40 mg total) by mouth daily. <PLEASE MAKE APPOINTMENT FOR REFILLS>  . traMADol (ULTRAM) 50 MG tablet Take 1 tablet (50 mg total) by mouth every 8 (eight) hours as needed (pain).  [name] UNABLE TO FIND Med Name:Cpap at night with 2lpm o2  . vitamin C (ASCORBIC ACID) 500 MG tablet Take 500 mg by mouth daily.                            Objective:   Physical Exam    11/03/2016         310   09/21/16 (!) 327 lb (148.3 kg)  07/28/16 (!) 331 lb (150.1 kg)  07/06/16 (!) 319 lb (144.7 kg)    Vital signs reviewed - Note on arrival 02 sats  97% on 2lpm  But also 97% off 02 (see resp failure a/p)     HEENT: nl dentition, turbinates bilaterally, and oropharynx. Nl external ear canals without cough reflex   NECK :  without JVD/Nodes/TM/ nl carotid upstrokes bilaterally   LUNGS: no acc muscle use,  Nl contour chest which is clear to A and P bilaterally without cough on insp or exp maneuvers   CV:  RRR  no s3 or murmur or increase in P2, and no edema   ABD:  Obese/ soft and nontender with limited inspiratory excursion in the supine position. No bruits or organomegaly appreciated, bowel sounds nl  MS:  Nl gait/ ext warm without deformities, calf tenderness, cyanosis or clubbing No obvious joint restrictions   SKIN: warm and dry without lesions    NEURO:  alert, approp, nl sensorium with  no motor or cerebellar deficits apparent.

## 2017-09-07 ENCOUNTER — Encounter: Payer: Self-pay | Admitting: Internal Medicine

## 2017-09-07 NOTE — Assessment & Plan Note (Signed)
Body mass index is 48.38 kg/m.  -  trending down / encouraged Lab Results  Component Value Date   TSH 0.76 05/12/2017     Contributing to gerd risk/ doe/reviewed the need and the process to achieve and maintain neg calorie balance > defer f/u primary care including intermittently monitoring thyroid status

## 2017-09-07 NOTE — Assessment & Plan Note (Signed)
-   quit smoking 2000 PFT's  04/06/10  FEV1 1.10 (48 % ) ratio 55  p 0 % improvement from saba p ?  prior to study with DLCO  55 % corrects to 133  % for alv volume   - 09/21/2016    restart symbicort - Allergy profile   09/21/16 >  Eos 0. 2/  IgE  62 neg RAST - 09/06/2017  After extensive coaching HFA effectiveness =    90% > resume symb 160 2bid      Seems better on symb than off and more limited by obesity than copd at this point though could consider a trial of lama ie spiriva smi on return p first do med reconciliation and sort thru formulary issues   Discussed  Formulary restrictions will be an ongoing challenge for the forseable future and I would be happy to pick an alternative if the pt will first  provide me a list of them but pt  will need to return here for training for any new device that is required eg dpi vs hfa vs respimat.    In meantime we can always provide samples so the patient never runs out of any needed respiratory medications.   Rec return with all meds in hand using a trust but verify approach to confirm accurate Medication  Reconciliation The principal here is that until we are certain that the  patients are doing what we've asked, it makes no sense to ask them to do more.    I had an extended discussion with the patient reviewing all relevant studies completed to date and  lasting 15 to 20 minutes of a 25 minute visit    Each maintenance medication was reviewed in detail including most importantly the difference between maintenance and prns and under what circumstances the prns are to be triggered using an action plan format that is not reflected in the computer generated alphabetically organized AVS.    Please see AVS for specific instructions unique to this visit that I personally wrote and verbalized to the the pt in detail and then reviewed with pt  by my nurse highlighting any  changes in therapy recommended at today's visit to their plan of care.

## 2017-09-07 NOTE — Assessment & Plan Note (Signed)
10/05/16 Pt oxygen 87% with exertion on room air PT RA Sat at rest 95% Pt Sat on exertion with 2L Oxygen 93% - HCO3  05/12/2017  = 35 - 09/06/2017   Walked RA x one lap @ 185 stopped due to  Sob with sats still 97%    As of 09/06/2017 rec 02 2lpm sleeping/ prn daytime with ex only

## 2017-09-28 ENCOUNTER — Ambulatory Visit (INDEPENDENT_AMBULATORY_CARE_PROVIDER_SITE_OTHER): Payer: Medicare Other | Admitting: Adult Health

## 2017-09-28 ENCOUNTER — Encounter: Payer: Self-pay | Admitting: Adult Health

## 2017-09-28 DIAGNOSIS — J449 Chronic obstructive pulmonary disease, unspecified: Secondary | ICD-10-CM | POA: Diagnosis not present

## 2017-09-28 DIAGNOSIS — G4733 Obstructive sleep apnea (adult) (pediatric): Secondary | ICD-10-CM

## 2017-09-28 DIAGNOSIS — J9611 Chronic respiratory failure with hypoxia: Secondary | ICD-10-CM

## 2017-09-28 NOTE — Addendum Note (Signed)
Addended by: Parke Poisson E on: 09/28/2017 01:13 PM   Modules accepted: Orders

## 2017-09-28 NOTE — Assessment & Plan Note (Signed)
Cont on O2 with act and At bedtime  With CPAP

## 2017-09-28 NOTE — Assessment & Plan Note (Signed)
Compensated without flare  Patient's medications were reviewed today and patient education was given. Computerized medication calendar was adjusted/completed  Plan  Patient Instructions  Follow med calendar closely and bring to next office visit.  Continue on Symbicort 2 puffs Twice daily  . Rinse after use.  Continue on CPAP At bedtime   Work on healthy weight .  Continue on Oxygen with activity and At bedtime  With CPAP .  Follow up with Dr. Melvyn Novas  In 3 months and As needed    Please contact office for sooner follow up if symptoms do not improve or worsen or seek emergency care

## 2017-09-28 NOTE — Progress Notes (Signed)
@Patient  ID: Rich Number, female    DOB: Oct 24, 1945, 72 y.o.   MRN: [id number]  Chief Complaint  Patient presents with  . Follow-up    COPD /OSA     Referring provider: Biagio Borg, MD  HPI: 72 year old female former smoker, quit 2000, followed for COPD and obstructive sleep apnea She has hypertension, breast cancer with previous left lumpectomy and diastolic congestive heart failure, PFO, Pulmonary HTN   TEST :   Duplex neg DVT, CT angio neg PE, echo >> nml LV fn, mod LVH.   PFTs showed moderate airway obstruction FEV1 48%, no BD response, mild restriction, DLCO corrects for alveolar volume c/w obesity  PSG 03/30/10 surprisingly showed AHI 0.7/h & RDI 9/h, 36 RERAs over 249 mins TST, lowest desatn 92% on 2 L [place]  July 2011 -ONO showed significant desaturation for > 4h  ECHO TEE 03/15/13>EF ok, PFO +bubble  Echo 04/12/16 - Left ventricle: The cavity size was normal. There was mild concentric hypertrophy. Systolic function was normal. The estimated ejection fraction was in the range of 55% to 60%. Doppler parameters are consistent with both elevated ventricular end-diastolic filling pressure and elevated left atrial filling pressure. - Aortic valve: There was mild to moderate regurgitation. - Mitral valve: Eccentric posteriorly directed MR. Severely calcified annulus. There was moderate regurgitation. Valve area by pressure half-time: 2.27 cm^2. - Left atrium: The atrium was moderately dilated. - Atrial septum: No defect or patent foramen ovale was identified. - Tricuspid valve: There was moderate regurgitation. - Pulmonary arteries: PA peak pressure: 67 mm Hg (S).   09/28/2017 Follow up : COPD , OSA , Med Review  Patient presents for one-month follow-up.  Patient has known COPD.  Says that her breathing is doing okay overall.  She denies a flare of cough or wheezing.  She remains on Symbicort twice daily.  We reviewed all her medications.  Organize them into a  medication encounter.  Patient education was given.  She appears to be taking her medications correctly.  Patient remains on CPAP at bedtime.  Says she is doing well.  She feels rested. Download shows excellent compliance with average usage at 8.5 hours.  AHI 0.3.  Minimum leaks.  Uses Oxygen with heavy activity . Wear at bedtime with CPAP .    Allergies  Allergen Reactions  . Ciprofloxacin Other (See Comments)    foliculitis  . Cephalexin Other (See Comments)    Exfoliative dermatitis reaction  . Codeine     REACTION: unknown - pt. states unaware of having reaction to codeine  . Dilaudid [Hydromorphone] Other (See Comments)    Other Reaction: Oversedation  . Lisinopril     REACTION: cough  . Penicillins     rash  . Cleocin [Clindamycin Hcl] Rash    rash  . Doxycycline Rash    Immunization History  Administered Date(s) Administered  . Influenza Split 06/08/2011, 09/21/2011, 10/04/2012  . Influenza Whole 07/28/2009  . Influenza, High Dose Seasonal PF 05/12/2017  . Influenza,inj,Quad PF,6+ Mos 05/14/2014, 06/11/2015, 06/11/2016  . Pneumococcal Conjugate-13 07/25/2013  . Pneumococcal Polysaccharide-23 07/28/2009, 06/18/2011, 05/12/2017    Past Medical History:  Diagnosis Date  . Anemia   . Asthma 06/13/2011   hx of  . Atrial fibrillation ([place])   . Atrial flutter ([place])   . Breast cancer ([place]) dx'd 2005   No BP check or stick in Left arm  . CAD (coronary artery disease)    a. Nonobstructive CAD 2007 (EF 75%.  RCA  proximal 75% tubular, 25% prox LAD, 25% d1, 50% D2) at [facility] b. NSTEMI 01/2010 in setting of respiratory failure, medical approach (not a good candidate for noninvasive eval)  . Cellulitis    hx of cellulitis in left leg  . Cervical radiculopathy 06/13/2011  . COPD (chronic obstructive pulmonary disease) ([place])    on O2  . Depression 06/13/2011  . Diverticulosis   . Esophageal dysmotility   . Gout 06/13/2011  . History of Clostridium difficile early dec 2015    no current diarrhea  . History of home oxygen therapy    2 liters per nasal cannula all the time  . Hx of dysfunctional uterine bleeding    last bleeding jan 2016, worked up for with d and c x 2 no cause found  . Hyperlipidemia   . Hypertension   . Hypothyroidism   . Impaired glucose tolerance 11/01/2012  . Neuropathy 06/13/2011  . Obesity hypoventilation syndrome ([place]) 06/13/2011  . OSA on CPAP   . PFO (patent foramen ovale)   . Risk for falls   . Syncope and collapse    Bradycardia with pauses. S/P pacemaker  . Tubular adenoma of colon     Tobacco History: Social History   Tobacco Use  Smoking Status Former Smoker  . Packs/day: 1.00  . Years: 20.00  . Pack years: 20.00  . Types: Cigarettes  . Last attempt to quit: 09/20/1998  . Years since quitting: 19.0  Smokeless Tobacco Never Used  Tobacco Comment   1ppd x 20 years   Counseling given: Not Answered Comment: 1ppd x 20 years   Outpatient Encounter Medications as of 09/28/2017  Medication Sig  . acetaminophen (TYLENOL) 325 MG tablet Take as needed per bottle  . allopurinol (ZYLOPRIM) 300 MG tablet TAKE 1 TABLET DAILY  . amitriptyline (ELAVIL) 100 MG tablet TAKE 1 TABLET AT BEDTIME  . budesonide-formoterol (SYMBICORT) 160-4.5 MCG/ACT inhaler Inhale 2 puffs into the lungs 2 (two) times daily.  . Cholecalciferol (VITAMIN D) 2000 UNITS tablet Take 2,000 Units by mouth at bedtime.  . clonazePAM (KLONOPIN) 0.5 MG tablet Take 1 tablet (0.5 mg total) by mouth 2 (two) times daily as needed for anxiety.  . DULoxetine (CYMBALTA) 30 MG capsule Take 30 mg by mouth daily.  [name] ELIQUIS 5 MG TABS tablet TAKE 1 TABLET TWICE A DAY  . famotidine (PEPCID) 20 MG tablet Take 20 mg by mouth at bedtime.  . furosemide (LASIX) 40 MG tablet TAKE 1 TABLET EVERY MORNING AND 1 TABLET EVERY OTHER NIGHT  . levothyroxine (SYNTHROID, LEVOTHROID) 200 MCG tablet TAKE 1 TABLET DAILY  . metoprolol tartrate (LOPRESSOR) 25 MG tablet TAKE 1 TABLET TWICE A DAY  (FOLLOW UP APPOINTMENT DUE IN AUGUST MUST SEE MD FOR FUTURE REFILLS)  . omeprazole (PRILOSEC) 40 MG capsule Take 1 capsule (40 mg total) by mouth daily.  . OXYGEN 2lpm 24/7  . potassium chloride SA (K-DUR,KLOR-CON) 20 MEQ tablet TAKE 1 TABLET TWICE A DAY  . pravastatin (PRAVACHOL) 40 MG tablet Take 1 tablet (40 mg total) by mouth daily. <PLEASE MAKE APPOINTMENT FOR REFILLS>  . PROAIR HFA 108 (90 Base) MCG/ACT inhaler Inhale 2 puffs into the lungs every 4 (four) hours as needed for wheezing or shortness of breath.  . traMADol (ULTRAM) 50 MG tablet Take 1 tablet (50 mg total) by mouth every 8 (eight) hours as needed (pain).  [name] UNABLE TO FIND Med Name:Cpap at night with 2lpm o2  . vitamin C (ASCORBIC ACID) 500 MG tablet Take  500 mg by mouth daily.    No facility-administered encounter medications on file as of 09/28/2017.      Review of Systems  Constitutional:   No  weight loss, night sweats,  Fevers, chills, + fatigue, or  lassitude.  HEENT:   No headaches,  Difficulty swallowing,  Tooth/dental problems, or  Sore throat,                No sneezing, itching, ear ache, nasal congestion, post nasal drip,   CV:  No chest pain,  Orthopnea, PND,  , anasarca, dizziness, palpitations, syncope.   GI  No heartburn, indigestion, abdominal pain, nausea, vomiting, diarrhea, change in bowel habits, loss of appetite, bloody stools.   Resp:    No excess mucus, no productive cough,  No non-productive cough,  No coughing up of blood.  No change in color of mucus.  No wheezing.  No chest wall deformity  Skin: no rash or lesions.  GU: no dysuria, change in color of urine, no urgency or frequency.  No flank pain, no hematuria   MS:  No joint pain or swelling.  No decreased range of motion.  No back pain.    Physical Exam  BP 114/66 (BP Location: Right Arm, Cuff Size: Large)   Pulse 85   Ht 5' 5.5" (1.664 m)   Wt 296 lb 9.6 oz (134.5 kg)   SpO2 91%   BMI 48.61 kg/m   GEN: A/Ox3; pleasant , NAD,  obese , walker    HEENT:  [place]/AT,  EACs-clear, TMs-wnl, NOSE-clear, THROAT-clear, no lesions, no postnasal drip or exudate noted.   NECK:  Supple w/ fair ROM; no JVD; normal carotid impulses w/o bruits; no thyromegaly or nodules palpated; no lymphadenopathy.    RESP  Clear  P & A; w/o, wheezes/ rales/ or rhonchi. no accessory muscle use, no dullness to percussion  CARD:  RRR, no m/r/g, 1+ peripheral edema, pulses intact, no cyanosis or clubbing.  GI:   Soft & nt; nml bowel sounds; no organomegaly or masses detected.   Musco: Warm bil, no deformities or joint swelling noted.   Neuro: alert, no focal deficits noted.    Skin: Warm, no lesions or rashes    Lab Results:  CBC  BMET  Imaging: No results found.   Assessment & Plan:   COPD GOLD III Compensated without flare  Patient's medications were reviewed today and patient education was given. Computerized medication calendar was adjusted/completed  Plan  Patient Instructions  Follow med calendar closely and bring to next office visit.  Continue on Symbicort 2 puffs Twice daily  . Rinse after use.  Continue on CPAP At bedtime   Work on healthy weight .  Continue on Oxygen with activity and At bedtime  With CPAP .  Follow up with Dr. Melvyn Novas  In 3 months and As needed    Please contact office for sooner follow up if symptoms do not improve or worsen or seek emergency care       OSA (obstructive sleep apnea) Controlled on CPAP .  Wt loss .   Chronic respiratory failure (HCC) Cont on O2 with act and At bedtime  With CPAP      Rexene Edison, NP 09/28/2017

## 2017-09-28 NOTE — Assessment & Plan Note (Signed)
Controlled on CPAP .  Wt loss .

## 2017-09-28 NOTE — Progress Notes (Signed)
Chart and office note reviewed in detail  > agree with a/p as outlined    

## 2017-09-28 NOTE — Patient Instructions (Addendum)
Follow med calendar closely and bring to next office visit.  Continue on Symbicort 2 puffs Twice daily  . Rinse after use.  Continue on CPAP At bedtime   Work on healthy weight .  Continue on Oxygen with activity and At bedtime  With CPAP .  Follow up with Dr. Melvyn Novas  In 3 months and As needed    Please contact office for sooner follow up if symptoms do not improve or worsen or seek emergency care

## 2017-10-04 ENCOUNTER — Telehealth: Payer: Self-pay | Admitting: Internal Medicine

## 2017-10-04 NOTE — Telephone Encounter (Signed)
Copied from [place]. Topic: Quick Communication - See Telephone Encounter >> Oct 04, 2017  4:41 PM Conception Chancy, NT wrote: CRM for notification. See Telephone encounter for:  10/04/17.  patient is requesting a refill sent into [facility] for  Tramadol, clonazepam, and symbicort. Please advise

## 2017-10-05 ENCOUNTER — Encounter: Payer: Self-pay | Admitting: Internal Medicine

## 2017-10-05 MED ORDER — BUDESONIDE-FORMOTEROL FUMARATE 160-4.5 MCG/ACT IN AERO: 2.0000 | INHALATION_SPRAY | Freq: Two times a day (BID) | RESPIRATORY_TRACT | 5 refills | Status: DC

## 2017-10-05 MED ORDER — TRAMADOL HCL 50 MG PO TABS: 50.0000 mg | ORAL_TABLET | Freq: Three times a day (TID) | ORAL | 5 refills | Status: DC | PRN

## 2017-10-05 MED ORDER — CLONAZEPAM 0.5 MG PO TABS: 0.5000 mg | ORAL_TABLET | Freq: Two times a day (BID) | ORAL | 2 refills | Status: DC | PRN

## 2017-10-05 NOTE — Telephone Encounter (Signed)
Done erx 

## 2017-10-06 NOTE — Telephone Encounter (Signed)
dulera 200 2 bid  Air duo 113 2bid option  If neither feasible/affordable  then ov with formulary options asap to see NP or me as we have no samples of symbicort 160

## 2017-10-06 NOTE — Progress Notes (Signed)
HPI: fu atrial flutter. Also with hx of CAD, HTN, HL, OSA, hypothyroidism, COPD on O2, L breast CA. LHC at [facility] 10/2005: pRCA 75%, LM <25%, pLAD 25%, D1 25%, D2 50%. Carotid Dopplers June 2013 showed no significant stenosis. She suffered a NSTEMI in 01/2010 in the setting of SIRS from leg cellulitis. This was felt to be demand ischemia. Med Rx was pursued (felt to be a poor candidate for noninvasive imaging). Patient had atrial flutter with RVR 6/14. She was felt to be a poor candidate for atrial flutter ablation. TEE guided cardioversion was performed. She had restoration of NSR. TEE 03/15/13: EF 55-60%, no LA clot, positive PFO, mild to moderate TR. Patient admitted in December of 2014 following a syncopal episode. She had an implantable loop placed which demonstrated symptomatic bradycardia with pauses and ultimately had a pacemaker placed. Also with diastolic CHF. Echocardiogram repeated February 2018 showed normal LV function, mild left ventricular hypertrophy, grade 2 diastolic dysfunction and mild to moderate aortic insufficiency. Since last seen, the patient has dyspnea with more extreme activities but not with routine activities. It is relieved with rest. It is not associated with chest pain. There is no orthopnea, PND or pedal edema. There is no syncope or palpitations. There is no exertional chest pain.  Current Outpatient Medications  Medication Sig Dispense Refill  . acetaminophen (TYLENOL) 325 MG tablet Take as needed per bottle    . allopurinol (ZYLOPRIM) 300 MG tablet TAKE 1 TABLET DAILY 90 tablet 1  . amitriptyline (ELAVIL) 100 MG tablet TAKE 1 TABLET AT BEDTIME 90 tablet 1  . budesonide-formoterol (SYMBICORT) 160-4.5 MCG/ACT inhaler Inhale 2 puffs into the lungs 2 (two) times daily. 1 Inhaler 5  . Cholecalciferol (VITAMIN D) 2000 UNITS tablet Take 2,000 Units by mouth at bedtime.    . clonazePAM (KLONOPIN) 0.5 MG tablet Take 1 tablet (0.5 mg total) by mouth 2 (two) times daily as  needed for anxiety. 60 tablet 2  . dextromethorphan-guaiFENesin (MUCINEX DM) 30-600 MG 12hr tablet Take 1 tablet by mouth 2 (two) times daily as needed for cough.    . DULoxetine (CYMBALTA) 30 MG capsule 2 at bedtime    . ELIQUIS 5 MG TABS tablet TAKE 1 TABLET TWICE A DAY 180 tablet 0  . famotidine (PEPCID) 20 MG tablet Take 20 mg by mouth at bedtime.    . furosemide (LASIX) 40 MG tablet Take 40 mg by mouth daily. May add 1 extra daily as needed for leg swelling    . levothyroxine (SYNTHROID, LEVOTHROID) 200 MCG tablet TAKE 1 TABLET DAILY 90 tablet 0  . metoprolol tartrate (LOPRESSOR) 25 MG tablet 1 tablet in the morning and 1/2 tab at bedtime    . omeprazole (PRILOSEC) 40 MG capsule Take 1 capsule (40 mg total) by mouth daily. 90 capsule 3  . OXYGEN 2lpm 24/7    . potassium chloride SA (K-DUR,KLOR-CON) 20 MEQ tablet TAKE 1 TABLET TWICE A DAY 180 tablet 3  . pravastatin (PRAVACHOL) 40 MG tablet Take 1 tablet (40 mg total) by mouth daily. <PLEASE MAKE APPOINTMENT FOR REFILLS> 90 tablet 3  . PROAIR HFA 108 (90 Base) MCG/ACT inhaler Inhale 2 puffs into the lungs every 4 (four) hours as needed for wheezing or shortness of breath. 1 Inhaler 4  . traMADol (ULTRAM) 50 MG tablet Take 1 tablet (50 mg total) by mouth every 8 (eight) hours as needed (pain). 90 tablet 5  . UNABLE TO FIND Med Name:Cpap at night with  2lpm o2    . vitamin C (ASCORBIC ACID) 500 MG tablet Take 500 mg by mouth daily.      No current facility-administered medications for this visit.      Past Medical History:  Diagnosis Date  . Anemia   . Asthma 06/13/2011   hx of  . Atrial fibrillation ([place])   . Atrial flutter ([place])   . Breast cancer ([place]) dx'd 2005   No BP check or stick in Left arm  . CAD (coronary artery disease)    a. Nonobstructive CAD 2007 (EF 75%.  RCA  proximal 75% tubular, 25% prox LAD, 25% d1, 50% D2) at [facility]. b. NSTEMI 01/2010 in setting of respiratory failure, medical approach (not a good candidate for  noninvasive eval)  . Cellulitis    hx of cellulitis in left leg  . Cervical radiculopathy 06/13/2011  . COPD (chronic obstructive pulmonary disease) ([place])    on O2  . Depression 06/13/2011  . Diverticulosis   . Esophageal dysmotility   . Gout 06/13/2011  . History of Clostridium difficile early dec 2015   no current diarrhea  . History of home oxygen therapy    2 liters per nasal cannula all the time  . Hx of dysfunctional uterine bleeding    last bleeding jan 2016, worked up for with d and c x 2 no cause found  . Hyperlipidemia   . Hypertension   . Hypothyroidism   . Impaired glucose tolerance 11/01/2012  . Neuropathy 06/13/2011  . Obesity hypoventilation syndrome ([place]) 06/13/2011  . OSA on CPAP   . PFO (patent foramen ovale)   . Risk for falls   . Syncope and collapse    Bradycardia with pauses. S/P pacemaker  . Tubular adenoma of colon     Past Surgical History:  Procedure Laterality Date  . BREAST LUMPECTOMY Left 05/2004  . CARDIOVERSION N/A 03/15/2013   Procedure: CARDIOVERSION;  Surgeon: Thayer Headings, MD;  Location[facility] ENDOSCOPY;  Service: Cardiovascular;  Laterality: N/A;  . CERVICAL BIOPSY  June 2013   at [facility] for Heavy  Bleeding  . [place] SURGERY  2004 or 2005   have cadaver bones,, screws, and plates c 5 to c 6  . COLONOSCOPY    . COLONOSCOPY WITH PROPOFOL N/A 10/22/2014   Procedure: COLONOSCOPY WITH PROPOFOL;  Surgeon: Jerene Bears, MD;  Location: [facility] ENDOSCOPY;  Service: Gastroenterology;  Laterality: N/A;  . DILATION AND CURETTAGE OF UTERUS    . LAPAROSCOPIC APPENDECTOMY  03/29/2012   Procedure: APPENDECTOMY LAPAROSCOPIC;  Surgeon: Adin Hector, MD;  Location: [facility] ORS;  Service: General;  Laterality: N/A;  . LOOP RECORDER EXPLANT  08/31/2013   Procedure: LOOP RECORDER EXPLANT;  Surgeon: Coralyn Mark, MD;  Location: [facility] CATH LAB;  Service: Cardiovascular;;  . LOOP RECORDER IMPLANT  08-28-2013; 08-31-2013   MDT LinQ implanted by Dr Rayann Heman for syncope;  explanted 08-31-2013 after sinus pauses identified  . LOOP RECORDER IMPLANT N/A 08/28/2013   Procedure: LOOP RECORDER IMPLANT;  Surgeon: Coralyn Mark, MD;  Location: [place] CATH LAB;  Service: Cardiovascular;  Laterality: N/A;  . OVARIAN CYST REMOVAL    . PACEMAKER INSERTION  08-31-2013   Biotronik Evera dual chamber pacemaker placed for neurocardiogenic syncope and sinus pauses by Dr Rayann Heman  . PERMANENT PACEMAKER INSERTION N/A 08/31/2013   Procedure: PERMANENT PACEMAKER INSERTION;  Surgeon: Coralyn Mark, MD;  Location: [place] CATH LAB;  Service: Cardiovascular;  Laterality: N/A;  . TEE WITHOUT CARDIOVERSION N/A  03/15/2013   Procedure: TRANSESOPHAGEAL ECHOCARDIOGRAM (TEE);  Surgeon: Thayer Headings, MD;  Location: [facility] ENDOSCOPY;  Service: Cardiovascular;  Laterality: N/A;    Social History   Socioeconomic History  . Marital status: Divorced    Spouse name: Not on file  . Number of children: 2  . Years of education: Not on file  . Highest education level: Not on file  Social Needs  . Financial resource strain: Not on file  . Food insecurity - worry: Not on file  . Food insecurity - inability: Not on file  . Transportation needs - medical: Not on file  . Transportation needs - non-medical: Not on file  Occupational History  . Occupation: Retired  Tobacco Use  . Smoking status: Former Smoker    Packs/day: 1.00    Years: 20.00    Pack years: 20.00    Types: Cigarettes    Last attempt to quit: 09/20/1998    Years since quitting: 19.0  . Smokeless tobacco: Never Used  . Tobacco comment: 1ppd x 20 years  Substance and Sexual Activity  . Alcohol use: Yes    Alcohol/week: 0.6 oz    Types: 1 Glasses of wine per week    Comment: rare  . Drug use: No  . Sexual activity: Not Currently    Birth control/protection: Post-menopausal  Other Topics Concern  . Not on file  Social History Narrative   Lives alone in an apartment on the first floor.   Has 2 children.  Retired Advertising copywriter.   Education: some college.     Family History  Problem Relation Age of Onset  . Uterine cancer Mother   . Heart disease Mother   . Breast cancer Sister 52  . Ovarian cancer Sister   . Breast cancer Cousin 75  . Diabetes Brother   . Breast cancer Maternal Aunt   . Ovarian cancer Sister     ROS: no fevers or chills, productive cough, hemoptysis, dysphasia, odynophagia, melena, hematochezia, dysuria, hematuria, rash, seizure activity, orthopnea, PND, pedal edema, claudication. Remaining systems are negative.  Physical Exam: Well-developed obese in no acute distress.  Skin is warm and dry.  HEENT is normal.  Neck is supple.  Chest is clear to auscultation with normal expansion.  Cardiovascular exam is regular rate and rhythm.  Abdominal exam nontender or distended. No masses palpated. Extremities show no edema. neuro grossly intact  ECG- NSR, no ST changes; personally reviewed  A/P  1 coronary artery disease-patient denies chest pain.  Continue medical therapy including statin.  No aspirin given need for anticoagulation.  2 chronic diastolic congestive heart failure-patient appears to be euvolemic.  Continue present dose of Lasix.  Check potassium and renal function.  Patient instructed on low-sodium diet and fluid restriction.  3 history of atrial flutter-patient remains in sinus rhythm.  We will continue with beta blockade for rate control if atrial flutter recurs.  Continue apixaban.  Check hemoglobin and renal function.  4 hypertension-blood pressure is controlled.  Continue present medications.  5 hyperlipidemia- Last LDL 8/18 101.  Discontinue pravastatin.  Begin Lipitor 40 mg daily.  Check lipids and liver in 4 weeks.  6 morbid obesity-patient instructed on need for weight loss.  She has lost 63 pounds to date.  7 aortic insufficiency-I will plan to repeat echocardiogram when she returns in 1 year.  8 pacemaker-followed by electrophysiology.  Kirk Ruths,  MD

## 2017-10-06 NOTE — Telephone Encounter (Signed)
MW, please advise on pt's email

## 2017-10-13 ENCOUNTER — Ambulatory Visit (INDEPENDENT_AMBULATORY_CARE_PROVIDER_SITE_OTHER): Payer: Medicare Other | Admitting: Cardiology

## 2017-10-13 ENCOUNTER — Encounter: Payer: Self-pay | Admitting: Cardiology

## 2017-10-13 VITALS — BP 124/71 | HR 85 | Ht 65.5 in | Wt 297.4 lb

## 2017-10-13 DIAGNOSIS — I2510 Atherosclerotic heart disease of native coronary artery without angina pectoris: Secondary | ICD-10-CM | POA: Diagnosis not present

## 2017-10-13 DIAGNOSIS — I5032 Chronic diastolic (congestive) heart failure: Secondary | ICD-10-CM | POA: Diagnosis not present

## 2017-10-13 DIAGNOSIS — I4892 Unspecified atrial flutter: Secondary | ICD-10-CM

## 2017-10-13 MED ORDER — ATORVASTATIN CALCIUM 40 MG PO TABS: 40.0000 mg | ORAL_TABLET | Freq: Every day | ORAL | 3 refills | Status: DC

## 2017-10-13 NOTE — Patient Instructions (Signed)
Medication Instructions:   STOP PRAVASTATIN WHEN CURRENT SUPPLY COMPLETE  START ATORVASTATIN 40 MG ONCE DAILY WHEN PRAVASTATIN COMPLETE  Labwork:  Your physician recommends that you return for lab work AFTER TAKING ATORVASTATIN FOR 4 WEEKS=PRIOR TO EATING  Follow-Up:  Your physician wants you to follow-up in: [place] will receive a reminder letter in the mail two months in advance. If you don't receive a letter, please call our office to schedule the follow-up appointment.   If you need a refill on your cardiac medications before your next appointment, please call your pharmacy.

## 2017-11-07 ENCOUNTER — Ambulatory Visit (INDEPENDENT_AMBULATORY_CARE_PROVIDER_SITE_OTHER): Payer: Medicare Other | Admitting: *Deleted

## 2017-11-07 DIAGNOSIS — I495 Sick sinus syndrome: Secondary | ICD-10-CM

## 2017-11-07 NOTE — Progress Notes (Signed)
Remote pacemaker transmission.   

## 2017-11-08 LAB — CUP PACEART REMOTE DEVICE CHECK
Battery Remaining Percentage: 75 %
Brady Statistic RA Percent Paced: 15 %
Brady Statistic RV Percent Paced: 0 %
Implantable Lead Implant Date: 20141212
Implantable Lead Location: 753859
Implantable Lead Location: 753860
Implantable Lead Model: 346
Implantable Lead Serial Number: 29409274
Implantable Pulse Generator Implant Date: 20141212
Lead Channel Setting Pacing Amplitude: 2 V
Lead Channel Setting Pacing Pulse Width: 0.75 ms
MDC IDC LEAD IMPLANT DT: 20141212
MDC IDC LEAD SERIAL: 29535412
MDC IDC MSMT LEADCHNL RA IMPEDANCE VALUE: 543 Ohm
MDC IDC MSMT LEADCHNL RV IMPEDANCE VALUE: 800 Ohm
MDC IDC PG SERIAL: 68181424
MDC IDC SESS DTM: 20190219091535
MDC IDC SET LEADCHNL RV PACING AMPLITUDE: 2.6 V
MDC IDC STAT BRADY AP VP PERCENT: 0 %
MDC IDC STAT BRADY AP VS PERCENT: 15 %
MDC IDC STAT BRADY AS VP PERCENT: 0 %
MDC IDC STAT BRADY AS VS PERCENT: 85 %

## 2017-11-09 ENCOUNTER — Encounter: Payer: Self-pay | Admitting: Cardiology

## 2017-11-11 ENCOUNTER — Ambulatory Visit: Payer: Medicare Other | Admitting: Internal Medicine

## 2017-11-13 ENCOUNTER — Other Ambulatory Visit: Payer: Self-pay | Admitting: Internal Medicine

## 2017-11-15 ENCOUNTER — Encounter: Payer: Self-pay | Admitting: Cardiology

## 2017-11-16 ENCOUNTER — Other Ambulatory Visit: Payer: Self-pay | Admitting: Cardiology

## 2017-11-16 ENCOUNTER — Other Ambulatory Visit: Payer: Self-pay | Admitting: *Deleted

## 2017-11-16 ENCOUNTER — Encounter: Payer: Self-pay | Admitting: Internal Medicine

## 2017-11-16 DIAGNOSIS — E7800 Pure hypercholesterolemia, unspecified: Secondary | ICD-10-CM

## 2017-11-16 DIAGNOSIS — I509 Heart failure, unspecified: Secondary | ICD-10-CM

## 2017-11-16 DIAGNOSIS — R7989 Other specified abnormal findings of blood chemistry: Secondary | ICD-10-CM

## 2017-11-17 ENCOUNTER — Encounter: Payer: Self-pay | Admitting: Cardiology

## 2017-11-23 ENCOUNTER — Ambulatory Visit (INDEPENDENT_AMBULATORY_CARE_PROVIDER_SITE_OTHER): Payer: Medicare Other | Admitting: Internal Medicine

## 2017-11-23 ENCOUNTER — Encounter: Payer: Self-pay | Admitting: Internal Medicine

## 2017-11-23 VITALS — BP 132/86 | HR 87 | Temp 98.2°F | Ht 65.5 in | Wt 286.0 lb

## 2017-11-23 DIAGNOSIS — I10 Essential (primary) hypertension: Secondary | ICD-10-CM | POA: Diagnosis not present

## 2017-11-23 DIAGNOSIS — I2510 Atherosclerotic heart disease of native coronary artery without angina pectoris: Secondary | ICD-10-CM

## 2017-11-23 DIAGNOSIS — E7800 Pure hypercholesterolemia, unspecified: Secondary | ICD-10-CM | POA: Diagnosis not present

## 2017-11-23 DIAGNOSIS — R7302 Impaired glucose tolerance (oral): Secondary | ICD-10-CM

## 2017-11-23 MED ORDER — DULOXETINE HCL 30 MG PO CPEP: 60.0000 mg | ORAL_CAPSULE | Freq: Every day | ORAL | 3 refills | Status: DC

## 2017-11-23 NOTE — Patient Instructions (Addendum)
Please continue all other medications as before, and refills have been done if requested 0 the cymbalta  Please have the pharmacy call with any other refills you may need.  Please continue your efforts at being more active, low cholesterol diet, and weight control.  Please keep your appointments with your specialists as you may have planned  Please return in 6 months, or sooner if needed

## 2017-11-23 NOTE — Progress Notes (Signed)
Subjective:    Patient ID: [name] [name], female    DOB: 03-28-1946, 72 y.o.   MRN: [id number]  HPI    Here to f/u; overall doing ok,  Pt denies chest pain, increasing sob or doe, wheezing, orthopnea, PND, increased LE swelling, palpitations, dizziness or syncope.  Pt denies new neurological symptoms such as new headache, or facial or extremity weakness or numbness.  Pt denies polydipsia, polyuria, or low sugar episode.  Pt states overall good compliance with meds, mostly trying to follow appropriate diet  Lost overall 10 lbs with better diet.  Peak wt has been 365, steadily losing for over a yr. Wt Readings from Last 3 Encounters:  11/23/17 286 lb (129.7 kg)  10/13/17 297 lb 6.4 oz (134.9 kg)  09/28/17 296 lb 9.6 oz (134.5 kg)  Has frozen right shoulder makes ambulation with wlaler difficult, throws balance off, but no falls  Declines shingles shot.  C/o chronic "all over" pain, tramadol makes her sleepy but does not want change to ER tramadol, conts to use  Cont's to see cardiology with CHF, plan for further testing with next visit per pt.   Has had some financial difficulty, cutting back on expenses and hesitates to say she she takes all meds consistently.  No other interval hx or new complaints Past Medical History:  Diagnosis Date  . Anemia   . Asthma 06/13/2011   hx of  . Atrial fibrillation ([place])   . Atrial flutter ([place])   . Breast cancer ([place]) dx'd 2005   No BP check or stick in Left arm  . CAD (coronary artery disease)    a. Nonobstructive CAD 2007 (EF 75%.  RCA  proximal 75% tubular, 25% prox LAD, 25% d1, 50% D2) at [facility] b. NSTEMI 01/2010 in setting of respiratory failure, medical approach (not a good candidate for noninvasive eval)  . Cellulitis    hx of cellulitis in left leg  . Cervical radiculopathy 06/13/2011  . COPD (chronic obstructive pulmonary disease) ([place])    on O2  . Depression 06/13/2011  . Diverticulosis   . Esophageal dysmotility   . Gout 06/13/2011  . History of  Clostridium difficile early dec 2015   no current diarrhea  . History of home oxygen therapy    2 liters per nasal cannula all the time  . Hx of dysfunctional uterine bleeding    last bleeding jan 2016, worked up for with d and c x 2 no cause found  . Hyperlipidemia   . Hypertension   . Hypothyroidism   . Impaired glucose tolerance 11/01/2012  . Neuropathy 06/13/2011  . Obesity hypoventilation syndrome ([place]) 06/13/2011  . OSA on CPAP   . PFO (patent foramen ovale)   . Risk for falls   . Syncope and collapse    Bradycardia with pauses. S/P pacemaker  . Tubular adenoma of colon    Past Surgical History:  Procedure Laterality Date  . BREAST LUMPECTOMY Left 05/2004  . CARDIOVERSION N/A 03/15/2013   Procedure: CARDIOVERSION;  Surgeon: Thayer Headings, MD;  Location: [facility] ENDOSCOPY;  Service: Cardiovascular;  Laterality: N/A;  . CERVICAL BIOPSY  June 2013   at [facility] for Heavy  Bleeding  . [place] SURGERY  2004 or 2005   have cadaver bones,, screws, and plates c 5 to c 6  . COLONOSCOPY    . COLONOSCOPY WITH PROPOFOL N/A 10/22/2014   Procedure: COLONOSCOPY WITH PROPOFOL;  Surgeon: Jerene Bears, MD;  Location: [facility] ENDOSCOPY;  Service: Gastroenterology;  Laterality: N/A;  . DILATION AND CURETTAGE OF UTERUS    . LAPAROSCOPIC APPENDECTOMY  03/29/2012   Procedure: APPENDECTOMY LAPAROSCOPIC;  Surgeon: Adin Hector, MD;  Location: [facility] ORS;  Service: General;  Laterality: N/A;  . LOOP RECORDER EXPLANT  08/31/2013   Procedure: LOOP RECORDER EXPLANT;  Surgeon: Coralyn Mark, MD;  Location: [facility] CATH LAB;  Service: Cardiovascular;;  . LOOP RECORDER IMPLANT  08-28-2013; 08-31-2013   MDT LinQ implanted by Dr Rayann Heman for syncope; explanted 08-31-2013 after sinus pauses identified  . LOOP RECORDER IMPLANT N/A 08/28/2013   Procedure: LOOP RECORDER IMPLANT;  Surgeon: Coralyn Mark, MD;  Location: [place] CATH LAB;  Service: Cardiovascular;  Laterality: N/A;  . OVARIAN CYST REMOVAL    . PACEMAKER INSERTION   08-31-2013   Biotronik Evera dual chamber pacemaker placed for neurocardiogenic syncope and sinus pauses by Dr Rayann Heman  . PERMANENT PACEMAKER INSERTION N/A 08/31/2013   Procedure: PERMANENT PACEMAKER INSERTION;  Surgeon: Coralyn Mark, MD;  Location: [place] CATH LAB;  Service: Cardiovascular;  Laterality: N/A;  . TEE WITHOUT CARDIOVERSION N/A 03/15/2013   Procedure: TRANSESOPHAGEAL ECHOCARDIOGRAM (TEE);  Surgeon: Thayer Headings, MD;  Location: [facility] ENDOSCOPY;  Service: Cardiovascular;  Laterality: N/A;    reports that she quit smoking about 19 years ago. Her smoking use included cigarettes. She has a 20.00 pack-year smoking history. she has never used smokeless tobacco. She reports that she drinks about 0.6 oz of alcohol per week. She reports that she does not use drugs. family history includes Breast cancer in her maternal aunt; Breast cancer (age of onset: 51) in her sister; Breast cancer (age of onset: 73) in her cousin; Diabetes in her brother; Heart disease in her mother; Ovarian cancer in her sister and sister; Uterine cancer in her mother. Allergies  Allergen Reactions  . Ciprofloxacin Other (See Comments)    foliculitis  . Cephalexin Other (See Comments)    Exfoliative dermatitis reaction  . Codeine     REACTION: unknown - pt. states unaware of having reaction to codeine  . Dilaudid [Hydromorphone] Other (See Comments)    Other Reaction: Oversedation  . Lisinopril     REACTION: cough  . Penicillins     rash  . Cleocin [Clindamycin Hcl] Rash    rash  . Doxycycline Rash   Current Outpatient Medications on File Prior to Visit  Medication Sig Dispense Refill  . acetaminophen (TYLENOL) 325 MG tablet Take as needed per bottle    . allopurinol (ZYLOPRIM) 300 MG tablet TAKE 1 TABLET DAILY 90 tablet 1  . amitriptyline (ELAVIL) 100 MG tablet TAKE 1 TABLET AT BEDTIME 90 tablet 1  . atorvastatin (LIPITOR) 40 MG tablet Take 1 tablet (40 mg total) by mouth daily. 90 tablet 3  .  budesonide-formoterol (SYMBICORT) 160-4.5 MCG/ACT inhaler Inhale 2 puffs into the lungs 2 (two) times daily. 1 Inhaler 5  . Cholecalciferol (VITAMIN D) 2000 UNITS tablet Take 2,000 Units by mouth at bedtime.    . clonazePAM (KLONOPIN) 0.5 MG tablet Take 1 tablet (0.5 mg total) by mouth 2 (two) times daily as needed for anxiety. 60 tablet 2  . dextromethorphan-guaiFENesin (MUCINEX DM) 30-600 MG 12hr tablet Take 1 tablet by mouth 2 (two) times daily as needed for cough.    [name] ELIQUIS 5 MG TABS tablet TAKE 1 TABLET TWICE A DAY 180 tablet 0  . famotidine (PEPCID) 20 MG tablet Take 20 mg by mouth at bedtime.    . furosemide (LASIX) 40  MG tablet take 2 tablets by mouth twice a day 360 tablet 3  . levothyroxine (SYNTHROID, LEVOTHROID) 200 MCG tablet TAKE 1 TABLET DAILY 90 tablet 0  . metoprolol tartrate (LOPRESSOR) 25 MG tablet 1 tablet in the morning and 1/2 tab at bedtime    . omeprazole (PRILOSEC) 40 MG capsule Take 1 capsule (40 mg total) by mouth daily. 90 capsule 3  . OXYGEN 2lpm 24/7    . potassium chloride SA (K-DUR,KLOR-CON) 20 MEQ tablet TAKE 1 TABLET TWICE A DAY 180 tablet 3  . PROAIR HFA 108 (90 Base) MCG/ACT inhaler Inhale 2 puffs into the lungs every 4 (four) hours as needed for wheezing or shortness of breath. 1 Inhaler 4  . traMADol (ULTRAM) 50 MG tablet Take 1 tablet (50 mg total) by mouth every 8 (eight) hours as needed (pain). 90 tablet 5  . UNABLE TO FIND Med Name:Cpap at night with 2lpm o2    . vitamin C (ASCORBIC ACID) 500 MG tablet Take 500 mg by mouth daily.      No current facility-administered medications on file prior to visit.    Review of Systems  Constitutional: Negative for other unusual diaphoresis or sweats HENT: Negative for ear discharge or swelling Eyes: Negative for other worsening visual disturbances Respiratory: Negative for stridor or other swelling  Gastrointestinal: Negative for worsening distension or other blood Genitourinary: Negative for retention or  other urinary change Musculoskeletal: Negative for other MSK pain or swelling Skin: Negative for color change or other new lesions Neurological: Negative for worsening tremors and other numbness  Psychiatric/Behavioral: Negative for worsening agitation or other fatigue All other system neg per pt    Objective:   Physical Exam BP 132/86   Pulse 87   Temp 98.2 F (36.8 C) (Oral)   Ht 5' 5.5" (1.664 m)   Wt 286 lb (129.7 kg)   SpO2 96%   BMI 46.87 kg/m  VS noted,  Constitutional: Pt appears in NAD HENT: Head: NCAT.  Right Ear: External ear normal.  Left Ear: External ear normal.  Eyes: . Pupils are equal, round, and reactive to light. Conjunctivae and EOM are normal Nose: without d/c or deformity Neck: Neck supple. Gross normal ROM Cardiovascular: Normal rate and regular rhythm.   Pulmonary/Chest: Effort normal and breath sounds without rales or wheezing.  Neurological: Pt is alert. At baseline orientation, motor grossly intact Skin: Skin is warm. No rashes, other new lesions, no LE edema Psychiatric: Pt behavior is normal without agitation  No other exam findings    Assessment & Plan:

## 2017-11-24 DIAGNOSIS — I2510 Atherosclerotic heart disease of native coronary artery without angina pectoris: Secondary | ICD-10-CM | POA: Diagnosis not present

## 2017-11-25 LAB — LIPID PANEL
CHOL/HDL RATIO: 2.9 ratio (ref 0.0–4.4)
Cholesterol, Total: 139 mg/dL (ref 100–199)
HDL: 48 mg/dL (ref >39)
LDL CALC: 64 mg/dL (ref 0–99)
TRIGLYCERIDES: 137 mg/dL (ref 0–149)
VLDL CHOLESTEROL CAL: 27 mg/dL (ref 5–40)

## 2017-11-25 LAB — COMPREHENSIVE METABOLIC PANEL
A/G RATIO: 1.2 (ref 1.2–2.2)
ALK PHOS: 104 IU/L (ref 39–117)
ALT: 14 IU/L (ref 0–32)
AST: 20 IU/L (ref 0–40)
Albumin: 4 g/dL (ref 3.5–4.8)
BILIRUBIN TOTAL: 0.7 mg/dL (ref 0.0–1.2)
BUN/Creatinine Ratio: 9 — ABNORMAL LOW (ref 12–28)
BUN: 9 mg/dL (ref 8–27)
CHLORIDE: 95 mmol/L — AB (ref 96–106)
CO2: 30 mmol/L — ABNORMAL HIGH (ref 20–29)
Calcium: 9.7 mg/dL (ref 8.7–10.3)
Creatinine, Ser: 0.96 mg/dL (ref 0.57–1.00)
GFR calc Af Amer: 69 mL/min/{1.73_m2} (ref >59)
GFR calc non Af Amer: 60 mL/min/{1.73_m2} (ref >59)
Globulin, Total: 3.3 g/dL (ref 1.5–4.5)
Glucose: 83 mg/dL (ref 65–99)
POTASSIUM: 3.7 mmol/L (ref 3.5–5.2)
Sodium: 142 mmol/L (ref 134–144)
Total Protein: 7.3 g/dL (ref 6.0–8.5)

## 2017-11-25 LAB — CBC
HEMOGLOBIN: 12.4 g/dL (ref 11.1–15.9)
Hematocrit: 38.2 % (ref 34.0–46.6)
MCH: 27.8 pg (ref 26.6–33.0)
MCHC: 32.5 g/dL (ref 31.5–35.7)
MCV: 86 fL (ref 79–97)
PLATELETS: 217 10*3/uL (ref 150–379)
RBC: 4.46 x10E6/uL (ref 3.77–5.28)
RDW: 14.7 % (ref 12.3–15.4)
WBC: 4.8 10*3/uL (ref 3.4–10.8)

## 2017-11-26 ENCOUNTER — Encounter: Payer: Self-pay | Admitting: Cardiology

## 2017-11-26 NOTE — Assessment & Plan Note (Signed)
stable overall by history and exam, recent data reviewed with pt, and pt to continue medical treatment as before,  to f/u any worsening symptoms or concerns, goal < 70

## 2017-11-26 NOTE — Assessment & Plan Note (Signed)
stable overall by history and exam, recent data reviewed with pt, and pt to continue medical treatment as before,  to f/u any worsening symptoms or concerns BP Readings from Last 3 Encounters:  11/23/17 132/86  10/13/17 124/71  09/28/17 114/66

## 2017-11-26 NOTE — Assessment & Plan Note (Signed)
Lab Results  Component Value Date   HGBA1C 5.6 05/12/2017  stable overall by history and exam, recent data reviewed with pt, and pt to continue medical treatment as before,  to f/u any worsening symptoms or concerns

## 2017-11-28 ENCOUNTER — Other Ambulatory Visit: Payer: Self-pay | Admitting: Nurse Practitioner

## 2017-11-28 ENCOUNTER — Ambulatory Visit
Admission: RE | Admit: 2017-11-28 | Discharge: 2017-11-28 | Disposition: A | Discharge status: Home / Self Care | Payer: Medicare Other | Source: Ambulatory Visit | Attending: Nurse Practitioner | Admitting: Nurse Practitioner

## 2017-11-28 DIAGNOSIS — Z853 Personal history of malignant neoplasm of breast: Secondary | ICD-10-CM

## 2017-11-28 DIAGNOSIS — N6310 Unspecified lump in the right breast, unspecified quadrant: Secondary | ICD-10-CM

## 2017-11-28 DIAGNOSIS — N6489 Other specified disorders of breast: Secondary | ICD-10-CM | POA: Diagnosis not present

## 2017-11-28 DIAGNOSIS — R921 Mammographic calcification found on diagnostic imaging of breast: Secondary | ICD-10-CM | POA: Diagnosis not present

## 2017-11-28 HISTORY — DX: Personal history of irradiation: Z92.3

## 2017-11-28 HISTORY — DX: Personal history of antineoplastic chemotherapy: Z92.21

## 2017-12-08 ENCOUNTER — Encounter: Payer: Self-pay | Admitting: Internal Medicine

## 2017-12-09 ENCOUNTER — Encounter: Payer: Self-pay | Admitting: Internal Medicine

## 2017-12-09 MED ORDER — HYDROXYZINE HCL 25 MG PO TABS: 25.0000 mg | ORAL_TABLET | Freq: Three times a day (TID) | ORAL | 1 refills | Status: DC | PRN

## 2017-12-10 ENCOUNTER — Other Ambulatory Visit: Payer: Self-pay | Admitting: Internal Medicine

## 2017-12-12 ENCOUNTER — Telehealth: Payer: Self-pay

## 2017-12-12 NOTE — Telephone Encounter (Signed)
Approved 11/12/17-12/12/18  Determination and paper PA sent to scan

## 2017-12-27 ENCOUNTER — Encounter: Payer: Self-pay | Admitting: Internal Medicine

## 2017-12-27 ENCOUNTER — Ambulatory Visit (INDEPENDENT_AMBULATORY_CARE_PROVIDER_SITE_OTHER): Payer: Medicare Other | Admitting: Internal Medicine

## 2017-12-27 VITALS — BP 118/66 | HR 80 | Ht 65.5 in | Wt 281.0 lb

## 2017-12-27 DIAGNOSIS — J9612 Chronic respiratory failure with hypercapnia: Secondary | ICD-10-CM

## 2017-12-27 DIAGNOSIS — J449 Chronic obstructive pulmonary disease, unspecified: Secondary | ICD-10-CM

## 2017-12-27 DIAGNOSIS — I2510 Atherosclerotic heart disease of native coronary artery without angina pectoris: Secondary | ICD-10-CM

## 2017-12-27 DIAGNOSIS — J441 Chronic obstructive pulmonary disease with (acute) exacerbation: Secondary | ICD-10-CM

## 2017-12-27 DIAGNOSIS — J9611 Chronic respiratory failure with hypoxia: Secondary | ICD-10-CM

## 2017-12-27 MED ORDER — BUDESONIDE-FORMOTEROL FUMARATE 160-4.5 MCG/ACT IN AERO: 2.0000 | INHALATION_SPRAY | Freq: Two times a day (BID) | RESPIRATORY_TRACT | 0 refills | Status: DC

## 2017-12-27 MED ORDER — PREDNISONE 10 MG PO TABS
ORAL_TABLET | ORAL | 0 refills | Status: DC
Start: 2017-12-27 — End: 2018-04-28

## 2017-12-27 NOTE — Progress Notes (Signed)
Subjective:    Patient ID: [name] [name], female    DOB: 11-09-1945, .   MRN: [id number]  HPI PCP - John   71 yobf MO quit smoking in 2000 for FU of copd & OSA on 10 cm.  PMH -sleep apnea, hypertension,breast cancer with left lumpectomy and hypothyroidism , dCHF , PFO    TEST :   Duplex neg DVT, CT angio neg PE, echo >> nml LV fn, mod LVH.   PFTs showed moderate airway obstruction FEV1 48%, no BD response, mild restriction, DLCO corrects for alveolar volume c/w obesity  PSG 03/30/10 surprisingly showed AHI 0.7/h & RDI 9/h, 36 RERAs over 249 mins TST, lowest desatn 92% on 2 L [place]  July 2011 -ONO showed significant desaturation for > 4h  ECHO TEE 03/15/13>EF ok, PFO +bubble  Echo 04/12/16 - Left ventricle: The cavity size was normal. There was mild   concentric hypertrophy. Systolic function was normal. The   estimated ejection fraction was in the range of 55% to 60%.   Doppler parameters are consistent with both elevated ventricular   end-diastolic filling pressure and elevated left atrial filling   pressure. - Aortic valve: There was mild to moderate regurgitation. - Mitral valve: Eccentric posteriorly directed MR. Severely   calcified annulus. There was moderate regurgitation. Valve area   by pressure half-time: 2.27 cm^2. - Left atrium: The atrium was moderately dilated. - Atrial septum: No defect or patent foramen ovale was identified. - Tricuspid valve: There was moderate regurgitation. - Pulmonary arteries: PA peak pressure: 67 mm Hg (S).    06/11/2016 NP  Follow up : OSA /OHS  Pt returns for 4 month follow up .  She says she  Is doing well on CPAP .  Wearing CPAP At bedtime  , wears for 8hr each night.  Feels rested. Says she is doing very well, feels best in long time.  Download shows excellent compliance with average usage at 8.5 hours. She is on a set pressure at 10 cm of H2O. AHI 0.3. Leaks. Minimum. She is now with [employer] . Getting better customer service.  She denies  any chest pain, orthopnea, PND, or increased leg swelling Has some moderate Moderate COPD , previously on Symbicort and spriiva did not seem to make any difference. Does not like to take a lot of meds esp if they are not helping.  Uses ventolin on occasion . No flare of cough, wheeizng or dyspnea.  rec Continue on CPAP At bedtime  -keep up good work  Work on weight loss.  Do not drive if sleepy.  Flu shot today  Activity as tolerated.  Ventolin As needed  For wheezing/shortness of breathing .  Follow up Dr. Elsworth Soho  In  6 months  and As needed     09/21/2016 acute extended ov/Lennie Dunnigan re: COPD GOLD III/ no maint / cpap no 02  Chief Complaint  Patient presents with  . Acute Visit    Pt c/o increased SOB and congestion- this has been going on off and on for the past 3 months. She states she can not perform daily activities due to SOB. She has cough with clear sputum.  She states she has had swelling in her right leg- recently txed for cellulitis.   since flu shot more noted more doe assoc with  dry cough > no better with ventolin but hfa poor  Breathing overall  worse x 2 weeks (but not worse when seen in ER [id number] for itchy rash)  Voice is very hoarse  Ok at hs cpap and no noct or early am resp symptoms/ excess/ purulent sputum or mucus plugs rec Plan A = Automatic = Symbicort 160 Take 2 puffs first thing in am and then another 2 puffs about 12 hours later.  Work on inhaler technique:   Plan B = Backup Only use your albuterol as a rescue medication Please remember to go to the lab  department downstairs for your tests - we will call you with the results when they are available. Try [id number]  Take 30-60 min before first meal of the day and add  Pepcid ac (famotidine) 20 mg one @  bedtime until cough is completely gone for at least a week without the need for cough suppression GERD diet    11/03/2016  f/u ov/Sarahy Creedon re: GOLD II copd / symb 160 2bid / proair rarely needed  2lpm 24/7  Chief  Complaint  Patient presents with  . Follow-up    Breathing has improved some. She has been using o2 24/7.    Can now do housework on 02 2lpm / 4 wheel rolling walker with brakes  Even on 02 MMRC3 = can't walk 100 yards even at a slow pace at a flat grade s stopping due to sob  Even on 02  rec No need for 02 at rest - use 2lpm at bedtime and walkng  Plan A = Automatic = Symbicort 160 Take 2 puffs first thing in am and then another 2 puffs about 12 hours later.  Work on inhaler technique: Plan B = Backup Only use your albuterol as a rescue medication   12/27/2017  Extended f/u ov/Rakwon Letourneau re:  GOLD III/ 02 hs only / worse x 3 weeks p ran out of symb/ brought med calendar/ not updating or using as intended "it's for you, isn't it?" Chief Complaint  Patient presents with  . Follow-up    Breathing has been worse "for a while"- she can't afford the symbicort and ran out 3 wks ago. She has been using her rescue inhaler 1-2 x per day on average.    Dyspnea:  Can  barely do [employer] pushing basket s 02 doesn't check sats = MMRC3 = can't walk 100 yards even at a slow pace at a flat grade s stopping due to sob   Cough: worse since ran out of symbicort/ congested with sense of throat mucus  Sleep: ok cpap / 02  SABA use:  Bid since stopped symb which did seem to help sob and cough  No obvious day to day or daytime variability or assoc excess/ purulent sputum or mucus plugs or hemoptysis or cp or chest tightness, subjective wheeze or overt sinus or hb symptoms. No unusual exposure hx or h/o childhood pna/ asthma or knowledge of premature birth.  Sleeping ok flat without nocturnal  or early am exacerbation  of respiratory  c/o's or need for noct saba. Also denies any obvious fluctuation of symptoms with weather or environmental changes or other aggravating or alleviating factors except as outlined above   Current Allergies, Complete Past Medical History, Past Surgical History, Family History, and Social  History were reviewed in [employer].  ROS  The following are not active complaints unless bolded Hoarseness, sore throat, dysphagia, dental problems, itching, sneezing,  nasal congestion or discharge of excess mucus or purulent secretions, ear ache,   fever, chills, sweats, unintended wt loss or wt gain, classically pleuritic or exertional cp,  orthopnea  pnd or leg swelling, presyncope, palpitations, abdominal pain, anorexia, nausea, vomiting, diarrhea  or change in bowel habits or change in bladder habits, change in stools or change in urine, dysuria, hematuria,  rash, arthralgias, visual complaints, headache, numbness, weakness or ataxia or problems with walking or coordination,  change in mood = anxious  or memory.        Current Meds - not able to verify   Medication Sig  . acetaminophen (TYLENOL) 325 MG tablet Take as needed per bottle  . allopurinol (ZYLOPRIM) 300 MG tablet TAKE 1 TABLET DAILY  . amitriptyline (ELAVIL) 100 MG tablet TAKE 1 TABLET AT BEDTIME  . atorvastatin (LIPITOR) 40 MG tablet Take 1 tablet (40 mg total) by mouth daily.  . budesonide-formoterol (SYMBICORT) 160-4.5 MCG/ACT inhaler Inhale 2 puffs into the lungs 2 (two) times daily.  . Cholecalciferol (VITAMIN D) 2000 UNITS tablet Take 2,000 Units by mouth at bedtime.  . clonazePAM (KLONOPIN) 0.5 MG tablet Take 1 tablet (0.5 mg total) by mouth 2 (two) times daily as needed for anxiety.  [name] dextromethorphan-guaiFENesin (MUCINEX DM) 30-600 MG 12hr tablet Take 1 tablet by mouth 2 (two) times daily as needed for cough.  . DULoxetine (CYMBALTA) 30 MG capsule Take 2 capsules (60 mg total) by mouth at bedtime. 2 at bedtime  . ELIQUIS 5 MG TABS tablet TAKE 1 TABLET TWICE A DAY  . famotidine (PEPCID) 20 MG tablet Take 20 mg by mouth at bedtime.  . furosemide (LASIX) 40 MG tablet take 2 tablets by mouth twice a day  . hydrOXYzine (ATARAX/VISTARIL) 25 MG tablet Take 1 tablet (25 mg total) by mouth 3  (three) times daily as needed.  [name] levothyroxine (SYNTHROID, LEVOTHROID) 200 MCG tablet TAKE 1 TABLET DAILY  . metoprolol tartrate (LOPRESSOR) 25 MG tablet Take 1 tablet (25 mg total) by mouth 2 (two) times daily.  [name] omeprazole (PRILOSEC) 40 MG capsule Take 1 capsule (40 mg total) by mouth daily.  . OXYGEN 2lpm with CPAP at night and with exertion  . potassium chloride SA (K-DUR,KLOR-CON) 20 MEQ tablet TAKE 1 TABLET TWICE A DAY  . PROAIR HFA 108 (90 Base) MCG/ACT inhaler Inhale 2 puffs into the lungs every 4 (four) hours as needed for wheezing or shortness of breath.  . traMADol (ULTRAM) 50 MG tablet Take 1 tablet (50 mg total) by mouth every 8 (eight) hours as needed (pain).  [name] UNABLE TO FIND Med Name:Cpap at night with 2lpm o2  . vitamin C (ASCORBIC ACID) 500 MG tablet Take 500 mg by mouth daily.   .                                 Objective:   Physical Exam    amb bf with amb with rollator/ congested cough   12/27/2017           281  11/03/2016         310   09/21/16 (!) 327 lb (148.3 kg)  07/28/16 (!) 331 lb (150.1 kg)  07/06/16 (!) 319 lb (144.7 kg)      Vital signs reviewed - Note on arrival 02 sats  94% on RA      HEENT: nl dentition, turbinates bilaterally, and oropharynx. Nl external ear canals without cough reflex   NECK :  without JVD/Nodes/TM/ nl carotid upstrokes bilaterally   LUNGS: no acc muscle use,  Nl contour chest with pan exp rhonchi with some  upper airway features, better with plm    CV:  RRR  no s3 or murmur or increase in P2, and trace bilateral sym lower ext edema  ABD:  Obese soft and nontender with limited  inspiratory excursion in the supine position. No bruits or organomegaly appreciated, bowel sounds nl  MS:  Walking with rollator/ ext warm without deformities, calf tenderness, cyanosis or clubbing No obvious joint restrictions   SKIN: warm and dry without lesions    NEURO:  alert, approp, nl sensorium with  no motor or cerebellar  deficits apparent.

## 2017-12-27 NOTE — Patient Instructions (Addendum)
Prednisone 10 mg take  4 each am x 2 days,   2 each am x 2 days,  1 each am x 2 days and stop   GERD (REFLUX)  is an extremely common cause of respiratory symptoms just like yours , many times with no obvious heartburn at all.    It can be treated with medication, but also with lifestyle changes including elevation of the head of your bed (ideally with 6 inch  bed blocks),  Smoking cessation, avoidance of late meals, excessive alcohol, and avoid fatty foods, chocolate, peppermint, colas, red wine, and acidic juices such as orange juice.  NO MINT OR MENTHOL PRODUCTS SO NO COUGH DROPS  USE SUGARLESS CANDY INSTEAD (Jolley ranchers or Stover's or Life Savers) or even ice chips will also do - the key is to swallow to prevent all throat clearing. NO OIL BASED VITAMINS - use powdered substitutes.    See Tammy NP in  2 weeks with all your medications, even over the counter meds, separated in two separate bags, the ones you take no matter what vs the ones you stop once you feel better and take only as needed when you feel you need them.   Tammy  will generate for you a new user friendly medication calendar that will put [place] all on the same page re: your medication use.

## 2017-12-28 ENCOUNTER — Encounter: Payer: Self-pay | Admitting: Internal Medicine

## 2017-12-28 DIAGNOSIS — J441 Chronic obstructive pulmonary disease with (acute) exacerbation: Secondary | ICD-10-CM | POA: Insufficient documentation

## 2017-12-28 NOTE — Assessment & Plan Note (Addendum)
-   quit smoking 2000 PFT's  04/06/10  FEV1 1.10 (48 % ) ratio 55  p 0 % improvement from saba p ?  prior to study with DLCO  55 % corrects to 133  % for alv volume   - 09/21/2016    restart symbicort - Allergy profile   09/21/16 >  Eos 0. 2/  IgE  62 neg RAST - 09/06/2017   resume symb 160 2bid  - 12/27/2017  After extensive coaching inhaler device  effectiveness =    90% > resume symb 160 2bid   I had an extended discussion with the patient reviewing all relevant studies completed to date and  lasting 15 to 20 minutes of a 25 minute visit on the following ongoing concerns:   1) clearly better on symb than off so resume for now  2) Formulary restrictions will be an ongoing challenge for the forseable future and I would be happy to pick an alternative if the pt will first  provide me a list of them but pt  will need to return here for training for any new device that is required eg dpi vs hfa vs respimat.    In meantime we can always provide samples so the patient never runs out of any needed respiratory medications.   3)  Each maintenance medication was reviewed in detail including most importantly the difference between maintenance and as needed and under what circumstances the prns are to be used.  Please see AVS for specific  Instructions which are unique to this visit and I personally typed out  which were reviewed in detail in writing with the patient and a copy provided.

## 2017-12-28 NOTE — Assessment & Plan Note (Signed)
Body mass index is 46.05 kg/m.  -  trending down/ encouraged Lab Results  Component Value Date   TSH 0.76 05/12/2017     Contributing to gerd risk/ doe/reviewed the need and the process to achieve and maintain neg calorie balance > defer f/u primary care including intermittently monitoring thyroid status

## 2017-12-28 NOTE — Assessment & Plan Note (Signed)
12/27/2017 probably attributable to compliance > rx Prednisone 10 mg take  4 each am x 2 days,   2 each am x 2 days,  1 each am x 2 days and stop and resume symb 160 2bid  Then return with all meds in hand using a trust but verify approach to confirm accurate Medication  Reconciliation The principal here is that until we are certain that the  patients are doing what we've asked, it makes no sense to ask them to do more.

## 2017-12-28 NOTE — Assessment & Plan Note (Signed)
10/05/16 Pt oxygen 87% with exertion on room air PT RA Sat at rest 95% Pt Sat on exertion with 2L Oxygen 93% - HCO3  05/12/2017  = 35 - 09/06/2017   Walked RA x one lap @ 185 stopped due to  Sob with sats still 97%    As of 12/27/2017 rec 02 2lpm sleeping/ prn daytime with ex only  - she is neither using the oxygen with activity as I recommended nor monitor your saturations to be sure they are adequate when she does walk if she chooses not to walk with the oxygen. I've advised her on both of these issues today.

## 2018-01-05 NOTE — Progress Notes (Deleted)
Subjective:   [name] [name] is a 72 y.o. female who presents for an Initial Medicare Annual Wellness Visit.  Review of Systems    No ROS.  Medicare Wellness Visit. Additional risk factors are reflected in the social history.     Sleep patterns: {SX; SLEEP PATTERNS:18802::"feels rested on waking","does not get up to void","gets up *** times nightly to void","sleeps *** hours nightly"}.    Home Safety/Smoke Alarms: Feels safe in home. Smoke alarms in place.  Living environment; residence and Firearm Safety: {Rehab home environment / accessibility:30080::"no firearms","firearms stored safely"}. Seat Belt Safety/Bike Helmet: Wears seat belt.     Objective:    There were no vitals filed for this visit. There is no height or weight on file to calculate BMI.  Advanced Directives 09/02/2016 07/06/2016 06/08/2016 04/10/2016 02/12/2016 06/09/2015 04/29/2015  Does Patient Have a Medical Advance Directive? Yes Yes No No No No Yes  Type of Advance Directive Healthcare Power of [place]  Does patient want to make changes to medical advance directive? No - Patient declined - - - - - -  Would patient like information on creating a medical advance directive? No - Patient declined - - No - patient declined information - - -  Pre-existing out of facility DNR order (yellow form or pink MOST form) - - - - - - -    Current Medications (verified) Outpatient Encounter Medications as of 01/09/2018  Medication Sig  . acetaminophen (TYLENOL) 325 MG tablet Take as needed per bottle  . allopurinol (ZYLOPRIM) 300 MG tablet TAKE 1 TABLET DAILY  . amitriptyline (ELAVIL) 100 MG tablet TAKE 1 TABLET AT BEDTIME  . atorvastatin (LIPITOR) 40 MG tablet Take 1 tablet (40 mg total) by mouth daily.  . budesonide-formoterol (SYMBICORT) 160-4.5 MCG/ACT inhaler Inhale 2 puffs into the lungs 2 (two) times daily.  . Cholecalciferol (VITAMIN D) 2000 UNITS tablet  Take 2,000 Units by mouth at bedtime.  . clonazePAM (KLONOPIN) 0.5 MG tablet Take 1 tablet (0.5 mg total) by mouth 2 (two) times daily as needed for anxiety.  [name] dextromethorphan-guaiFENesin (MUCINEX DM) 30-600 MG 12hr tablet Take 1 tablet by mouth 2 (two) times daily as needed for cough.  . DULoxetine (CYMBALTA) 30 MG capsule Take 2 capsules (60 mg total) by mouth at bedtime. 2 at bedtime  . ELIQUIS 5 MG TABS tablet TAKE 1 TABLET TWICE A DAY  . famotidine (PEPCID) 20 MG tablet Take 20 mg by mouth at bedtime.  . furosemide (LASIX) 40 MG tablet take 2 tablets by mouth twice a day  . hydrOXYzine (ATARAX/VISTARIL) 25 MG tablet Take 1 tablet (25 mg total) by mouth 3 (three) times daily as needed.  [name] levothyroxine (SYNTHROID, LEVOTHROID) 200 MCG tablet TAKE 1 TABLET DAILY  . metoprolol tartrate (LOPRESSOR) 25 MG tablet Take 1 tablet (25 mg total) by mouth 2 (two) times daily.  [name] omeprazole (PRILOSEC) 40 MG capsule Take 1 capsule (40 mg total) by mouth daily.  . OXYGEN 2lpm with CPAP at night and with exertion  . potassium chloride SA (K-DUR,KLOR-CON) 20 MEQ tablet TAKE 1 TABLET TWICE A DAY  . predniSONE (DELTASONE) 10 MG tablet Take  4 each am x 2 days,   2 each am x 2 days,  1 each am x 2 days and stop  . PROAIR HFA 108 (90 Base) MCG/ACT inhaler Inhale 2 puffs into the lungs every 4 (four) hours as needed for wheezing  or shortness of breath.  . traMADol (ULTRAM) 50 MG tablet Take 1 tablet (50 mg total) by mouth every 8 (eight) hours as needed (pain).  [name] UNABLE TO FIND Med Name:Cpap at night with 2lpm o2  . vitamin C (ASCORBIC ACID) 500 MG tablet Take 500 mg by mouth daily.    No facility-administered encounter medications on file as of 01/09/2018.     Allergies (verified) Ciprofloxacin; Cephalexin; Codeine; Dilaudid [hydromorphone]; Lisinopril; Penicillins; Cleocin [clindamycin hcl]; and Doxycycline   History: Past Medical History:  Diagnosis Date  . Anemia   . Asthma 06/13/2011   hx of  .  Atrial fibrillation ([place])   . Atrial flutter ([place])   . Breast cancer ([place]) dx'd 2005   No BP check or stick in Left arm  . CAD (coronary artery disease)    a. Nonobstructive CAD 2007 (EF 75%.  RCA  proximal 75% tubular, 25% prox LAD, 25% d1, 50% D2) at [facility]. b. NSTEMI 01/2010 in setting of respiratory failure, medical approach (not a good candidate for noninvasive eval)  . Cellulitis    hx of cellulitis in left leg  . Cervical radiculopathy 06/13/2011  . COPD (chronic obstructive pulmonary disease) ([place])    on O2  . Depression 06/13/2011  . Diverticulosis   . Esophageal dysmotility   . Gout 06/13/2011  . History of Clostridium difficile early dec 2015   no current diarrhea  . History of home oxygen therapy    2 liters per nasal cannula all the time  . Hx of dysfunctional uterine bleeding    last bleeding jan 2016, worked up for with d and c x 2 no cause found  . Hyperlipidemia   . Hypertension   . Hypothyroidism   . Impaired glucose tolerance 11/01/2012  . Neuropathy 06/13/2011  . Obesity hypoventilation syndrome ([place]) 06/13/2011  . OSA on CPAP   . Personal history of chemotherapy   . Personal history of radiation therapy   . PFO (patent foramen ovale)   . Risk for falls   . Syncope and collapse    Bradycardia with pauses. S/P pacemaker  . Tubular adenoma of colon    Past Surgical History:  Procedure Laterality Date  . BREAST BIOPSY    . BREAST LUMPECTOMY Left 05/2004  . CARDIOVERSION N/A 03/15/2013   Procedure: CARDIOVERSION;  Surgeon: Thayer Headings, MD;  Location: [facility] ENDOSCOPY;  Service: Cardiovascular;  Laterality: N/A;  . CERVICAL BIOPSY  June 2013   at [facility] for Heavy  Bleeding  . [place] SURGERY  2004 or 2005   have cadaver bones,, screws, and plates c 5 to c 6  . COLONOSCOPY    . COLONOSCOPY WITH PROPOFOL N/A 10/22/2014   Procedure: COLONOSCOPY WITH PROPOFOL;  Surgeon: Jerene Bears, MD;  Location: [facility] ENDOSCOPY;  Service: Gastroenterology;  Laterality: N/A;  .  DILATION AND CURETTAGE OF UTERUS    . LAPAROSCOPIC APPENDECTOMY  03/29/2012   Procedure: APPENDECTOMY LAPAROSCOPIC;  Surgeon: Adin Hector, MD;  Location: [facility] ORS;  Service: General;  Laterality: N/A;  . LOOP RECORDER EXPLANT  08/31/2013   Procedure: LOOP RECORDER EXPLANT;  Surgeon: Coralyn Mark, MD;  Location[facility] CATH LAB;  Service: Cardiovascular;;  . LOOP RECORDER IMPLANT  08-28-2013; 08-31-2013   MDT LinQ implanted by Dr Rayann Heman for syncope; explanted 08-31-2013 after sinus pauses identified  . LOOP RECORDER IMPLANT N/A 08/28/2013   Procedure: LOOP RECORDER IMPLANT;  Surgeon: Coralyn Mark, MD;  Location: [place] CATH LAB;  Service:  Cardiovascular;  Laterality: N/A;  . OVARIAN CYST REMOVAL    . PACEMAKER INSERTION  08-31-2013   Biotronik Evera dual chamber pacemaker placed for neurocardiogenic syncope and sinus pauses by Dr Rayann Heman  . PERMANENT PACEMAKER INSERTION N/A 08/31/2013   Procedure: PERMANENT PACEMAKER INSERTION;  Surgeon: Coralyn Mark, MD;  Location: [place] CATH LAB;  Service: Cardiovascular;  Laterality: N/A;  . TEE WITHOUT CARDIOVERSION N/A 03/15/2013   Procedure: TRANSESOPHAGEAL ECHOCARDIOGRAM (TEE);  Surgeon: Thayer Headings, MD;  Location: [facility] ENDOSCOPY;  Service: Cardiovascular;  Laterality: N/A;   Family History  Problem Relation Age of Onset  . Uterine cancer Mother   . Heart disease Mother   . Breast cancer Sister 76  . Ovarian cancer Sister   . Breast cancer Cousin 65  . Diabetes Brother   . Breast cancer Maternal Aunt   . Ovarian cancer Sister    Social History   Socioeconomic History  . Marital status: Divorced    Spouse name: Not on file  . Number of children: 2  . Years of education: Not on file  . Highest education level: Not on file  Occupational History  . Occupation: Retired  Scientific laboratory technician  . Financial resource strain: Not on file  . Food insecurity:    Worry: Not on file    Inability: Not on file  . Transportation needs:    Medical: Not on file     Non-medical: Not on file  Tobacco Use  . Smoking status: Former Smoker    Packs/day: 1.00    Years: 20.00    Pack years: 20.00    Types: Cigarettes    Last attempt to quit: 09/20/1998    Years since quitting: 19.3  . Smokeless tobacco: Never Used  . Tobacco comment: 1ppd x 20 years  Substance and Sexual Activity  . Alcohol use: Yes    Alcohol/week: 0.6 oz    Types: 1 Glasses of Jeslin Bazinet per week    Comment: rare  . Drug use: No  . Sexual activity: Not Currently    Birth control/protection: Post-menopausal  Lifestyle  . Physical activity:    Days per week: Not on file    Minutes per session: Not on file  . Stress: Not on file  Relationships  . Social connections:    Talks on phone: Not on file    Gets together: Not on file    Attends religious service: Not on file    Active member of club or organization: Not on file    Attends meetings of clubs or organizations: Not on file    Relationship status: Not on file  Other Topics Concern  . Not on file  Social History Narrative   Lives alone in an apartment on the first floor.   Has 2 children.  Retired Advertising copywriter.  Education: some college.     Tobacco Counseling Counseling given: Not Answered Comment: 1ppd x 20 years  Activities of Daily Living No flowsheet data found.   Immunizations and Health Maintenance Immunization History  Administered Date(s) Administered  . Influenza Split 06/08/2011, 09/21/2011, 10/04/2012  . Influenza Whole 07/28/2009  . Influenza, High Dose Seasonal PF 05/12/2017  . Influenza,inj,Quad PF,6+ Mos 05/14/2014, 06/11/2015, 06/11/2016  . Pneumococcal Conjugate-13 07/25/2013  . Pneumococcal Polysaccharide-23 07/28/2009, 06/18/2011, 05/12/2017   There are no preventive care reminders to display for this patient.  Patient Care Team: Biagio Borg, MD as PCP - General (Internal Medicine)  Indicate any recent Medical Services you may have received  from other than [facility] providers in the past  year (date may be approximate).     Assessment:   This is a routine wellness examination for [name] [name]. Physical assessment deferred to PCP.   Hearing/Vision screen No exam data present  Dietary issues and exercise activities discussed:   Diet (meal preparation, eat out, water intake, caffeinated beverages, dairy products, fruits and vegetables): {Desc; diets:16563}   Goals    None     Depression Screen PHQ 2/9 Scores 11/12/2016 05/06/2015 07/25/2013  PHQ - 2 Score 1 0 1    Fall Risk Fall Risk  11/23/2017 08/01/2017 05/12/2017 11/12/2016 08/27/2016  Falls in the past year? No No No Yes Yes  Comment - - - - Emmi Telephone Survey: data to providers prior to load  Number falls in past yr: - - - 1 1  Comment - - - - Emmi Telephone Survey Actual Response = 1  Injury with Fall? - - - No Yes   Cognitive Function:        Screening Tests Health Maintenance  Topic Date Due  . INFLUENZA VACCINE  04/20/2018  . MAMMOGRAM  11/29/2019  . TETANUS/TDAP  07/02/2023  . COLONOSCOPY  10/22/2024  . DEXA SCAN  Completed  . Hepatitis C Screening  Completed  . PNA vac Low Risk Adult  Completed     Plan:     I have personally reviewed and noted the following in the patient's chart:   . Medical and social history . Use of alcohol, tobacco or illicit drugs  . Current medications and supplements . Functional ability and status . Nutritional status . Physical activity . Advanced directives . List of other physicians . Vitals . Screenings to include cognitive, depression, and falls . Referrals and appointments  In addition, I have reviewed and discussed with patient certain preventive protocols, quality metrics, and best practice recommendations. A written personalized care plan for preventive services as well as general preventive health recommendations were provided to patient.     Michiel Cowboy, RN   01/05/2018

## 2018-01-09 ENCOUNTER — Ambulatory Visit: Payer: Medicare Other

## 2018-01-22 ENCOUNTER — Other Ambulatory Visit: Payer: Self-pay | Admitting: Internal Medicine

## 2018-01-22 DIAGNOSIS — R7989 Other specified abnormal findings of blood chemistry: Secondary | ICD-10-CM

## 2018-01-30 ENCOUNTER — Other Ambulatory Visit: Payer: Self-pay | Admitting: Internal Medicine

## 2018-02-06 ENCOUNTER — Ambulatory Visit (INDEPENDENT_AMBULATORY_CARE_PROVIDER_SITE_OTHER): Payer: Medicare Other | Admitting: *Deleted

## 2018-02-06 DIAGNOSIS — I495 Sick sinus syndrome: Secondary | ICD-10-CM

## 2018-02-07 ENCOUNTER — Encounter: Payer: Self-pay | Admitting: Cardiology

## 2018-02-07 NOTE — Progress Notes (Signed)
Remote pacemaker transmission.   

## 2018-02-09 LAB — CUP PACEART REMOTE DEVICE CHECK
Date Time Interrogation Session: 20190523091604
Implantable Lead Implant Date: 20141212
Implantable Lead Location: 753859
Implantable Lead Model: 346
Implantable Lead Serial Number: 29409274
Implantable Lead Serial Number: 29535412
MDC IDC LEAD IMPLANT DT: 20141212
MDC IDC LEAD LOCATION: 753860
MDC IDC PG IMPLANT DT: 20141212
MDC IDC PG SERIAL: 68181424

## 2018-02-11 ENCOUNTER — Other Ambulatory Visit: Payer: Self-pay | Admitting: Internal Medicine

## 2018-03-12 ENCOUNTER — Other Ambulatory Visit: Payer: Self-pay | Admitting: Cardiology

## 2018-03-13 ENCOUNTER — Other Ambulatory Visit: Payer: Self-pay | Admitting: Internal Medicine

## 2018-03-13 NOTE — Telephone Encounter (Signed)
Done erx 

## 2018-03-13 NOTE — Telephone Encounter (Signed)
Rx request sent to pharmacy.  

## 2018-03-22 ENCOUNTER — Encounter: Payer: Self-pay | Admitting: Internal Medicine

## 2018-03-22 ENCOUNTER — Ambulatory Visit (INDEPENDENT_AMBULATORY_CARE_PROVIDER_SITE_OTHER): Payer: Medicare Other | Admitting: Internal Medicine

## 2018-03-22 VITALS — BP 126/82 | HR 89 | Temp 98.2°F | Ht 65.5 in | Wt 269.0 lb

## 2018-03-22 DIAGNOSIS — E7800 Pure hypercholesterolemia, unspecified: Secondary | ICD-10-CM

## 2018-03-22 DIAGNOSIS — R7302 Impaired glucose tolerance (oral): Secondary | ICD-10-CM | POA: Diagnosis not present

## 2018-03-22 DIAGNOSIS — I10 Essential (primary) hypertension: Secondary | ICD-10-CM | POA: Diagnosis not present

## 2018-03-22 DIAGNOSIS — F419 Anxiety disorder, unspecified: Secondary | ICD-10-CM | POA: Insufficient documentation

## 2018-03-22 DIAGNOSIS — I2510 Atherosclerotic heart disease of native coronary artery without angina pectoris: Secondary | ICD-10-CM | POA: Diagnosis not present

## 2018-03-22 MED ORDER — AMITRIPTYLINE HCL 100 MG PO TABS: 100.0000 mg | ORAL_TABLET | Freq: Every day | ORAL | 0 refills | Status: DC

## 2018-03-22 MED ORDER — AMITRIPTYLINE HCL 100 MG PO TABS: 100.0000 mg | ORAL_TABLET | Freq: Every day | ORAL | 1 refills | Status: DC

## 2018-03-22 NOTE — Assessment & Plan Note (Signed)
stable overall by history and exam, recent data reviewed with pt, and pt to continue medical treatment as before,  to f/u any worsening symptoms or concerns Lab Results  Component Value Date   HGBA1C 5.6 05/12/2017

## 2018-03-22 NOTE — Assessment & Plan Note (Addendum)
Long discussion with pt, declines need for counseling, cont current med tx  Note:  Total time for pt hx, exam, review of record with pt in the room, determination of diagnoses and plan for further eval and tx is > 40 min, with over 50% spent in coordination and counseling of patient including the differential dx, tx, further evaluation and other management of anxiety, hyperglycemia, HLD, HTN

## 2018-03-22 NOTE — Progress Notes (Signed)
Subjective:    Patient ID: [name] [name], female    DOB: [birth year], 72 y.o.   MRN: [id number]  HPI here to f/u;  I dont know but Donnald Garre got something going on, more family stress and she thinks family thinks she is "extreme" maybe incompetant and mentally unstable"; she thinks they expect this as other family members developed this as they got older; feels like daughter may have financial motiviation as well as daughter just wants to live off her, a chronic problem (she states daughter is narcissistic psychopath)    Did have an episode of confusion and lost in a relatively familiar area, only lasted a few hours.  Daughter called soc services for home eval but son got it cancelled.  She is sure daughter is envy and resentful that she seems to be positive in her life despite her physical issues. Cont to lose wt intentionally. Denies worsening depressive symptoms, suicidal ideation, or panic; declines need for counseling, states her family needs it more than herself.   Wt Readings from Last 3 Encounters:  03/22/18 269 lb (122 kg)  12/27/17 281 lb (127.5 kg)  11/23/17 286 lb (129.7 kg)  Pt denies chest pain, increased sob or doe, wheezing, orthopnea, PND, increased LE swelling, palpitations, dizziness or syncope. Pt denies new neurological symptoms such as new headache, or facial or extremity weakness or numbness.   Pt denies polydipsia, polyuria   Past Medical History:  Diagnosis Date  . Anemia   . Asthma 06/13/2011   hx of  . Atrial fibrillation ([place])   . Atrial flutter ([place])   . Breast cancer ([place]) dx'd 2005   No BP check or stick in Left arm  . CAD (coronary artery disease)    a. Nonobstructive CAD 2007 (EF 75%.  RCA  proximal 75% tubular, 25% prox LAD, 25% d1, 50% D2) at [facility]. b. NSTEMI 01/2010 in setting of respiratory failure, medical approach (not a good candidate for noninvasive eval)  . Cellulitis    hx of cellulitis in left leg  . Cervical radiculopathy 06/13/2011  . COPD (chronic  obstructive pulmonary disease) ([place])    on O2  . Depression 06/13/2011  . Diverticulosis   . Esophageal dysmotility   . Gout 06/13/2011  . History of Clostridium difficile early dec 2015   no current diarrhea  . History of home oxygen therapy    2 liters per nasal cannula all the time  . Hx of dysfunctional uterine bleeding    last bleeding jan 2016, worked up for with d and c x 2 no cause found  . Hyperlipidemia   . Hypertension   . Hypothyroidism   . Impaired glucose tolerance 11/01/2012  . Neuropathy 06/13/2011  . Obesity hypoventilation syndrome ([place]) 06/13/2011  . OSA on CPAP   . Personal history of chemotherapy   . Personal history of radiation therapy   . PFO (patent foramen ovale)   . Risk for falls   . Syncope and collapse    Bradycardia with pauses. S/P pacemaker  . Tubular adenoma of colon    Past Surgical History:  Procedure Laterality Date  . BREAST BIOPSY    . BREAST LUMPECTOMY Left 05/2004  . CARDIOVERSION N/A 03/15/2013   Procedure: CARDIOVERSION;  Surgeon: Thayer Headings, MD;  Location: [facility] ENDOSCOPY;  Service: Cardiovascular;  Laterality: N/A;  . CERVICAL BIOPSY  June 2013   at [facility] for Heavy  Bleeding  . [place] SURGERY  2004 or 2005   have  cadaver bones,, screws, and plates c 5 to c 6  . COLONOSCOPY    . COLONOSCOPY WITH PROPOFOL N/A 10/22/2014   Procedure: COLONOSCOPY WITH PROPOFOL;  Surgeon: Jerene Bears, MD;  Location: [facility] ENDOSCOPY;  Service: Gastroenterology;  Laterality: N/A;  . DILATION AND CURETTAGE OF UTERUS    . LAPAROSCOPIC APPENDECTOMY  03/29/2012   Procedure: APPENDECTOMY LAPAROSCOPIC;  Surgeon: Adin Hector, MD;  Location: [facility] ORS;  Service: General;  Laterality: N/A;  . LOOP RECORDER EXPLANT  08/31/2013   Procedure: LOOP RECORDER EXPLANT;  Surgeon: Coralyn Mark, MD;  Location: [facility] CATH LAB;  Service: Cardiovascular;;  . LOOP RECORDER IMPLANT  08-28-2013; 08-31-2013   MDT LinQ implanted by Dr Rayann Heman for syncope; explanted 08-31-2013 after  sinus pauses identified  . LOOP RECORDER IMPLANT N/A 08/28/2013   Procedure: LOOP RECORDER IMPLANT;  Surgeon: Coralyn Mark, MD;  Location: [place] CATH LAB;  Service: Cardiovascular;  Laterality: N/A;  . OVARIAN CYST REMOVAL    . PACEMAKER INSERTION  08-31-2013   Biotronik Evera dual chamber pacemaker placed for neurocardiogenic syncope and sinus pauses by Dr Rayann Heman  . PERMANENT PACEMAKER INSERTION N/A 08/31/2013   Procedure: PERMANENT PACEMAKER INSERTION;  Surgeon: Coralyn Mark, MD;  Location: [place] CATH LAB;  Service: Cardiovascular;  Laterality: N/A;  . TEE WITHOUT CARDIOVERSION N/A 03/15/2013   Procedure: TRANSESOPHAGEAL ECHOCARDIOGRAM (TEE);  Surgeon: Thayer Headings, MD;  Location: [facility] ENDOSCOPY;  Service: Cardiovascular;  Laterality: N/A;    reports that she quit smoking about 19 years ago. Her smoking use included cigarettes. She has a 20.00 pack-year smoking history. She has never used smokeless tobacco. She reports that she drinks about 0.6 oz of alcohol per week. She reports that she does not use drugs. family history includes Breast cancer in her maternal aunt; Breast cancer (age of onset: 1) in her sister; Breast cancer (age of onset: 22) in her cousin; Diabetes in her brother; Heart disease in her mother; Ovarian cancer in her sister and sister; Uterine cancer in her mother. Allergies  Allergen Reactions  . Ciprofloxacin Other (See Comments)    foliculitis  . Cephalexin Other (See Comments)    Exfoliative dermatitis reaction  . Codeine     REACTION: unknown - pt. states unaware of having reaction to codeine  . Dilaudid [Hydromorphone] Other (See Comments)    Other Reaction: Oversedation  . Lisinopril     REACTION: cough  . Penicillins     rash  . Cleocin [Clindamycin Hcl] Rash    rash  . Doxycycline Rash   Current Outpatient Medications on File Prior to Visit  Medication Sig Dispense Refill  . acetaminophen (TYLENOL) 325 MG tablet Take as needed per bottle    . allopurinol  (ZYLOPRIM) 300 MG tablet TAKE 1 TABLET DAILY 90 tablet 1  . amitriptyline (ELAVIL) 100 MG tablet TAKE 1 TABLET AT BEDTIME 90 tablet 1  . atorvastatin (LIPITOR) 40 MG tablet Take 1 tablet (40 mg total) by mouth daily. 90 tablet 3  . budesonide-formoterol (SYMBICORT) 160-4.5 MCG/ACT inhaler Inhale 2 puffs into the lungs 2 (two) times daily. 1 Inhaler 0  . Cholecalciferol (VITAMIN D) 2000 UNITS tablet Take 2,000 Units by mouth at bedtime.    . clonazePAM (KLONOPIN) 0.5 MG tablet TAKE 1 TABLET BY MOUTH TWICE A DAY IF NEEDED FOR ANXIETY 60 tablet 2  . dextromethorphan-guaiFENesin (MUCINEX DM) 30-600 MG 12hr tablet Take 1 tablet by mouth 2 (two) times daily as needed for cough.    [name]  DULoxetine (CYMBALTA) 30 MG capsule Take 2 capsules (60 mg total) by mouth at bedtime. 2 at bedtime 180 capsule 3  . ELIQUIS 5 MG TABS tablet TAKE 1 TABLET TWICE A DAY 180 tablet 0  . famotidine (PEPCID) 20 MG tablet Take 20 mg by mouth at bedtime.    . furosemide (LASIX) 40 MG tablet TAKE 1 TABLET EVERY MORNING AND 1 TABLET EVERY OTHER NIGHT 180 tablet 1  . hydrOXYzine (ATARAX/VISTARIL) 25 MG tablet Take 1 tablet (25 mg total) by mouth 3 (three) times daily as needed. 60 tablet 1  . levothyroxine (SYNTHROID, LEVOTHROID) 200 MCG tablet TAKE 1 TABLET DAILY 90 tablet 0  . metoprolol tartrate (LOPRESSOR) 25 MG tablet Take 1 tablet (25 mg total) by mouth 2 (two) times daily. 180 tablet 2  . omeprazole (PRILOSEC) 40 MG capsule TAKE 1 CAPSULE DAILY 90 capsule 0  . OXYGEN 2lpm with CPAP at night and with exertion    . potassium chloride SA (K-DUR,KLOR-CON) 20 MEQ tablet TAKE 1 TABLET TWICE A DAY 180 tablet 3  . potassium chloride SA (K-DUR,KLOR-CON) 20 MEQ tablet TAKE 1 TABLET TWICE A DAY 180 tablet 3  . predniSONE (DELTASONE) 10 MG tablet Take  4 each am x 2 days,   2 each am x 2 days,  1 each am x 2 days and stop 14 tablet 0  . PROAIR HFA 108 (90 Base) MCG/ACT inhaler Inhale 2 puffs into the lungs every 4 (four) hours as needed  for wheezing or shortness of breath. 1 Inhaler 4  . traMADol (ULTRAM) 50 MG tablet Take 1 tablet (50 mg total) by mouth every 8 (eight) hours as needed (pain). 90 tablet 5  . UNABLE TO FIND Med Name:Cpap at night with 2lpm o2    . vitamin C (ASCORBIC ACID) 500 MG tablet Take 500 mg by mouth daily.      No current facility-administered medications on file prior to visit.    Review of Systems  Constitutional: Negative for other unusual diaphoresis or sweats HENT: Negative for ear discharge or swelling Eyes: Negative for other worsening visual disturbances Respiratory: Negative for stridor or other swelling  Gastrointestinal: Negative for worsening distension or other blood Genitourinary: Negative for retention or other urinary change Musculoskeletal: Negative for other MSK pain or swelling Skin: Negative for color change or other new lesions Neurological: Negative for worsening tremors and other numbness  Psychiatric/Behavioral: Negative for worsening agitation or other fatigue' All other system neg per pt    Objective:   Physical Exam BP 126/82   Pulse 89   Temp 98.2 F (36.8 C) (Oral)   Ht 5' 5.5" (1.664 m)   Wt 269 lb (122 kg)   SpO2 95%   BMI 44.08 kg/m  VS noted,  Constitutional: Pt appears in NAD HENT: Head: NCAT.  Right Ear: External ear normal.  Left Ear: External ear normal.  Eyes: . Pupils are equal, round, and reactive to light. Conjunctivae and EOM are normal Nose: without d/c or deformity Neck: Neck supple. Gross normal ROM Cardiovascular: Normal rate and regular rhythm.   Pulmonary/Chest: Effort normal and breath sounds decreased without rales or wheezing.  Abd:  Soft, NT, ND, + BS, no organomegaly Neurological: Pt is alert. At baseline orientation, motor grossly intact Skin: Skin is warm. No rashes, other new lesions, no LE edema Psychiatric: Pt behavior is normal without agitation  No other exam findings  Lab Results  Component Value Date   WBC 4.8  11/24/2017  HGB 12.4 11/24/2017   HCT 38.2 11/24/2017   PLT 217 11/24/2017   GLUCOSE 83 11/24/2017   CHOL 139 11/24/2017   TRIG 137 11/24/2017   HDL 48 11/24/2017   LDLCALC 64 11/24/2017   ALT 14 11/24/2017   AST 20 11/24/2017   NA 142 11/24/2017   K 3.7 11/24/2017   CL 95 (L) 11/24/2017   CREATININE 0.96 11/24/2017   BUN 9 11/24/2017   CO2 30 (H) 11/24/2017   TSH 0.76 05/12/2017   INR 1.08 09/02/2016   HGBA1C 5.6 05/12/2017          Assessment & Plan:

## 2018-03-22 NOTE — Assessment & Plan Note (Signed)
stable overall by history and exam, recent data reviewed with pt, and pt to continue medical treatment as before,  to f/u any worsening symptoms or concerns BP Readings from Last 3 Encounters:  03/22/18 126/82  12/27/17 118/66  11/23/17 132/86

## 2018-03-22 NOTE — Assessment & Plan Note (Signed)
stable overall by history and exam, recent data reviewed with pt, and pt to continue medical treatment as before,  to f/u any worsening symptoms or concerns Lab Results  Component Value Date   LDLCALC 64 11/24/2017

## 2018-03-22 NOTE — Patient Instructions (Signed)
Please continue all other medications as before, and refills have been done if requested - amytriptilene one month to local pharmacy, and [facility]  Your clonazepam should be at the local pharmacy as of June 24  Please have the pharmacy call with any other refills you may need.  Please continue your efforts at being more active, low cholesterol diet, and weight control.  Please keep your appointments with your specialists as you may have planned  Please return in 3 months, or sooner if needed

## 2018-04-18 ENCOUNTER — Other Ambulatory Visit: Payer: Self-pay | Admitting: Internal Medicine

## 2018-04-25 NOTE — Progress Notes (Signed)
HPI: fu atrial flutter. Also with hx of CAD, HTN, HL, OSA, hypothyroidism, COPD on O2, L breast CA. LHC at [facility] 10/2005: pRCA 75%, LM <25%, pLAD 25%, D1 25%, D2 50%. Carotid Dopplers June 2013 showed no significant stenosis. She suffered a NSTEMI in 01/2010 in the setting of SIRS from leg cellulitis. This was felt to be demand ischemia. Med Rx was pursued (felt to be a poor candidate for noninvasive imaging). Patient had atrial flutter with RVR 6/14. She was felt to be a poor candidate for atrial flutter ablation. TEE guided cardioversion was performed. She had restoration of NSR. TEE 03/15/13: EF 55-60%, no LA clot, positive PFO, mild to moderate TR. Patient admitted in December of 2014 following a syncopal episode. She had an implantable loop placed which demonstrated symptomatic bradycardia with pauses and ultimately had a pacemaker placed.Also with diastolic CHF.Echocardiogram repeated February 2018 showed normal LV function, mild left ventricular hypertrophy, grade 2 diastolic dysfunction and mild to moderate aortic insufficiency. Since last seen,she denies significant dyspnea on exertion, orthopnea, PND, pedal edema, chest pain or syncope.  Current Outpatient Medications  Medication Sig Dispense Refill  . acetaminophen (TYLENOL) 325 MG tablet Take as needed per bottle    . allopurinol (ZYLOPRIM) 300 MG tablet TAKE 1 TABLET DAILY 90 tablet 1  . amitriptyline (ELAVIL) 100 MG tablet TAKE 1 TABLET BY MOUTH AT BEDTIME 30 tablet 2  . atorvastatin (LIPITOR) 40 MG tablet Take 1 tablet (40 mg total) by mouth daily. 90 tablet 3  . budesonide-formoterol (SYMBICORT) 160-4.5 MCG/ACT inhaler Inhale 2 puffs into the lungs 2 (two) times daily. 1 Inhaler 0  . Cholecalciferol (VITAMIN D) 2000 UNITS tablet Take 2,000 Units by mouth at bedtime.    . clonazePAM (KLONOPIN) 0.5 MG tablet TAKE 1 TABLET BY MOUTH TWICE A DAY IF NEEDED FOR ANXIETY 60 tablet 2  . dextromethorphan-guaiFENesin (MUCINEX DM) 30-600 MG  12hr tablet Take 1 tablet by mouth 2 (two) times daily as needed for cough.    . DULoxetine (CYMBALTA) 30 MG capsule Take 2 capsules (60 mg total) by mouth at bedtime. 2 at bedtime 180 capsule 3  . ELIQUIS 5 MG TABS tablet TAKE 1 TABLET TWICE A DAY 180 tablet 0  . famotidine (PEPCID) 20 MG tablet Take 20 mg by mouth at bedtime.    . furosemide (LASIX) 40 MG tablet TAKE 1 TABLET EVERY MORNING AND 1 TABLET EVERY OTHER NIGHT 180 tablet 1  . hydrOXYzine (ATARAX/VISTARIL) 25 MG tablet Take 1 tablet (25 mg total) by mouth 3 (three) times daily as needed. 60 tablet 1  . levothyroxine (SYNTHROID, LEVOTHROID) 200 MCG tablet TAKE 1 TABLET DAILY 90 tablet 0  . metoprolol tartrate (LOPRESSOR) 25 MG tablet Take 1 tablet (25 mg total) by mouth 2 (two) times daily. 180 tablet 2  . omeprazole (PRILOSEC) 40 MG capsule TAKE 1 CAPSULE DAILY 90 capsule 0  . OXYGEN 2lpm with CPAP at night and with exertion    . potassium chloride SA (K-DUR,KLOR-CON) 20 MEQ tablet TAKE 1 TABLET TWICE A DAY 180 tablet 3  . PROAIR HFA 108 (90 Base) MCG/ACT inhaler Inhale 2 puffs into the lungs every 4 (four) hours as needed for wheezing or shortness of breath. 1 Inhaler 4  . traMADol (ULTRAM) 50 MG tablet Take 1 tablet (50 mg total) by mouth every 8 (eight) hours as needed (pain). 90 tablet 5  . UNABLE TO FIND Med Name:Cpap at night with 2lpm o2    .  vitamin C (ASCORBIC ACID) 500 MG tablet Take 500 mg by mouth daily.      No current facility-administered medications for this visit.      Past Medical History:  Diagnosis Date  . Anemia   . Asthma 06/13/2011   hx of  . Atrial fibrillation ([place])   . Atrial flutter ([place])   . Breast cancer ([place]) dx'd 2005   No BP check or stick in Left arm  . CAD (coronary artery disease)    a. Nonobstructive CAD 2007 (EF 75%.  RCA  proximal 75% tubular, 25% prox LAD, 25% d1, 50% D2) at [facility]. b. NSTEMI 01/2010 in setting of respiratory failure, medical approach (not a good candidate for noninvasive  eval)  . Cellulitis    hx of cellulitis in left leg  . Cervical radiculopathy 06/13/2011  . COPD (chronic obstructive pulmonary disease) ([place])    on O2  . Depression 06/13/2011  . Diverticulosis   . Esophageal dysmotility   . Gout 06/13/2011  . History of Clostridium difficile early dec 2015   no current diarrhea  . History of home oxygen therapy    2 liters per nasal cannula all the time  . Hx of dysfunctional uterine bleeding    last bleeding jan 2016, worked up for with d and c x 2 no cause found  . Hyperlipidemia   . Hypertension   . Hypothyroidism   . Impaired glucose tolerance 11/01/2012  . Neuropathy 06/13/2011  . Obesity hypoventilation syndrome ([place]) 06/13/2011  . OSA on CPAP   . Personal history of chemotherapy   . Personal history of radiation therapy   . PFO (patent foramen ovale)   . Risk for falls   . Syncope and collapse    Bradycardia with pauses. S/P pacemaker  . Tubular adenoma of colon     Past Surgical History:  Procedure Laterality Date  . BREAST BIOPSY    . BREAST LUMPECTOMY Left 05/2004  . CARDIOVERSION N/A 03/15/2013   Procedure: CARDIOVERSION;  Surgeon: Thayer Headings, MD;  Location: [facility] ENDOSCOPY;  Service: Cardiovascular;  Laterality: N/A;  . CERVICAL BIOPSY  June 2013   at [facility] for Heavy  Bleeding  . [place] SURGERY  2004 or 2005   have cadaver bones,, screws, and plates c 5 to c 6  . COLONOSCOPY    . COLONOSCOPY WITH PROPOFOL N/A 10/22/2014   Procedure: COLONOSCOPY WITH PROPOFOL;  Surgeon: Jerene Bears, MD;  Location: [facility] ENDOSCOPY;  Service: Gastroenterology;  Laterality: N/A;  . DILATION AND CURETTAGE OF UTERUS    . LAPAROSCOPIC APPENDECTOMY  03/29/2012   Procedure: APPENDECTOMY LAPAROSCOPIC;  Surgeon: Adin Hector, MD;  Location: [facility] ORS;  Service: General;  Laterality: N/A;  . LOOP RECORDER EXPLANT  08/31/2013   Procedure: LOOP RECORDER EXPLANT;  Surgeon: Coralyn Mark, MD;  Location: [facility] CATH LAB;  Service: Cardiovascular;;  . LOOP  RECORDER IMPLANT  08-28-2013; 08-31-2013   MDT LinQ implanted by Dr Rayann Heman for syncope; explanted 08-31-2013 after sinus pauses identified  . LOOP RECORDER IMPLANT N/A 08/28/2013   Procedure: LOOP RECORDER IMPLANT;  Surgeon: Coralyn Mark, MD;  Location: [place] CATH LAB;  Service: Cardiovascular;  Laterality: N/A;  . OVARIAN CYST REMOVAL    . PACEMAKER INSERTION  08-31-2013   Biotronik Evera dual chamber pacemaker placed for neurocardiogenic syncope and sinus pauses by Dr Rayann Heman  . PERMANENT PACEMAKER INSERTION N/A 08/31/2013   Procedure: PERMANENT PACEMAKER INSERTION;  Surgeon: Coralyn Mark, MD;  Location:  [place] CATH LAB;  Service: Cardiovascular;  Laterality: N/A;  . TEE WITHOUT CARDIOVERSION N/A 03/15/2013   Procedure: TRANSESOPHAGEAL ECHOCARDIOGRAM (TEE);  Surgeon: Thayer Headings, MD;  Location: [facility] ENDOSCOPY;  Service: Cardiovascular;  Laterality: N/A;    Social History   Socioeconomic History  . Marital status: Divorced    Spouse name: Not on file  . Number of children: 2  . Years of education: Not on file  . Highest education level: Not on file  Occupational History  . Occupation: Retired  Scientific laboratory technician  . Financial resource strain: Not on file  . Food insecurity:    Worry: Not on file    Inability: Not on file  . Transportation needs:    Medical: Not on file    Non-medical: Not on file  Tobacco Use  . Smoking status: Former Smoker    Packs/day: 1.00    Years: 20.00    Pack years: 20.00    Types: Cigarettes    Last attempt to quit: 09/20/1998    Years since quitting: 19.6  . Smokeless tobacco: Never Used  . Tobacco comment: 1ppd x 20 years  Substance and Sexual Activity  . Alcohol use: Yes    Alcohol/week: 1.0 standard drinks    Types: 1 Glasses of wine per week    Comment: rare  . Drug use: No  . Sexual activity: Not Currently    Birth control/protection: Post-menopausal  Lifestyle  . Physical activity:    Days per week: Not on file    Minutes per session: Not on  file  . Stress: Not on file  Relationships  . Social connections:    Talks on phone: Not on file    Gets together: Not on file    Attends religious service: Not on file    Active member of club or organization: Not on file    Attends meetings of clubs or organizations: Not on file    Relationship status: Not on file  . Intimate partner violence:    Fear of current or ex partner: Not on file    Emotionally abused: Not on file    Physically abused: Not on file    Forced sexual activity: Not on file  Other Topics Concern  . Not on file  Social History Narrative   Lives alone in an apartment on the first floor.   Has 2 children.  Retired Advertising copywriter.  Education: some college.     Family History  Problem Relation Age of Onset  . Uterine cancer Mother   . Heart disease Mother   . Breast cancer Sister 39  . Ovarian cancer Sister   . Breast cancer Cousin 69  . Diabetes Brother   . Breast cancer Maternal Aunt   . Ovarian cancer Sister     ROS: no fevers or chills, productive cough, hemoptysis, dysphasia, odynophagia, melena, hematochezia, dysuria, hematuria, rash, seizure activity, orthopnea, PND, pedal edema, claudication. Remaining systems are negative.  Physical Exam: Well-developed well-nourished in no acute distress.  Skin is warm and dry.  HEENT is normal.  Neck is supple.  Chest is clear to auscultation with normal expansion.  Cardiovascular exam is regular rate and rhythm.  2/6 systolic murmur left sternal border. Abdominal exam nontender or distended. No masses palpated. Extremities show no edema. neuro grossly intact  A/P  1 coronary artery disease-patient without chest pain.  Plan to continue with statin.  She is not on aspirin given need for anticoagulation.  2 chronic diastolic congestive  heart failure-patient does not appear to be volume overloaded on examination.  Continue present dose of Lasix.  Check potassium and renal function.  We discussed  low-sodium diet and fluid restriction.  3 history of atrial flutter-patient in sinus rhythm on examination today.  Continue apixaban and check hemoglobin and renal function.  Continue beta-blocker.  4 hypertension-blood pressure is controlled.  Continue present medications.  5 hyperlipidemia-continue statin.  6 aortic insufficiency-plan repeat echocardiogram.  7 morbid obesity-patient continues to try weight loss strategies.  8 pacemaker-followed by electrophysiology.  Kirk Ruths, MD

## 2018-04-28 ENCOUNTER — Ambulatory Visit (INDEPENDENT_AMBULATORY_CARE_PROVIDER_SITE_OTHER): Payer: Medicare Other | Admitting: Cardiology

## 2018-04-28 ENCOUNTER — Encounter: Payer: Self-pay | Admitting: Cardiology

## 2018-04-28 VITALS — BP 110/57 | HR 83 | Ht 65.5 in | Wt 271.0 lb

## 2018-04-28 DIAGNOSIS — I359 Nonrheumatic aortic valve disorder, unspecified: Secondary | ICD-10-CM | POA: Diagnosis not present

## 2018-04-28 DIAGNOSIS — I480 Paroxysmal atrial fibrillation: Secondary | ICD-10-CM

## 2018-04-28 DIAGNOSIS — I2510 Atherosclerotic heart disease of native coronary artery without angina pectoris: Secondary | ICD-10-CM | POA: Diagnosis not present

## 2018-04-28 DIAGNOSIS — I4892 Unspecified atrial flutter: Secondary | ICD-10-CM

## 2018-04-28 DIAGNOSIS — I5032 Chronic diastolic (congestive) heart failure: Secondary | ICD-10-CM

## 2018-04-28 LAB — BASIC METABOLIC PANEL
BUN/Creatinine Ratio: 16 (ref 12–28)
BUN: 14 mg/dL (ref 8–27)
CALCIUM: 9.6 mg/dL (ref 8.7–10.3)
CO2: 27 mmol/L (ref 20–29)
CREATININE: 0.87 mg/dL (ref 0.57–1.00)
Chloride: 106 mmol/L (ref 96–106)
GFR calc Af Amer: 78 mL/min/{1.73_m2} (ref >59)
GFR, EST NON AFRICAN AMERICAN: 67 mL/min/{1.73_m2} (ref >59)
GLUCOSE: 82 mg/dL (ref 65–99)
Potassium: 4 mmol/L (ref 3.5–5.2)
SODIUM: 146 mmol/L — AB (ref 134–144)

## 2018-04-28 LAB — CBC
HEMATOCRIT: 34.6 % (ref 34.0–46.6)
Hemoglobin: 11.3 g/dL (ref 11.1–15.9)
MCH: 28.4 pg (ref 26.6–33.0)
MCHC: 32.7 g/dL (ref 31.5–35.7)
MCV: 87 fL (ref 79–97)
Platelets: 188 10*3/uL (ref 150–450)
RBC: 3.98 x10E6/uL (ref 3.77–5.28)
RDW: 14.7 % (ref 12.3–15.4)
WBC: 4.7 10*3/uL (ref 3.4–10.8)

## 2018-04-28 NOTE — Patient Instructions (Signed)
Medication Instructions:   NO CHANGE  Labwork:  Your physician recommends that you HAVE LAB WORK TODAY  Testing/Procedures:  Your physician has requested that you have an echocardiogram. Echocardiography is a painless test that uses sound waves to create images of your heart. It provides your doctor with information about the size and shape of your heart and how well your heart's chambers and valves are working. This procedure takes approximately one hour. There are no restrictions for this procedure.    Follow-Up:  Your physician wants you to follow-up in: [place] will receive a reminder letter in the mail two months in advance. If you don't receive a letter, please call our office to schedule the follow-up appointment.   If you need a refill on your cardiac medications before your next appointment, please call your pharmacy.

## 2018-05-08 ENCOUNTER — Ambulatory Visit (INDEPENDENT_AMBULATORY_CARE_PROVIDER_SITE_OTHER): Payer: Medicare Other | Admitting: *Deleted

## 2018-05-08 DIAGNOSIS — I495 Sick sinus syndrome: Secondary | ICD-10-CM

## 2018-05-09 ENCOUNTER — Ambulatory Visit (HOSPITAL_COMMUNITY): Payer: Medicare Other | Attending: Cardiovascular Disease

## 2018-05-09 ENCOUNTER — Other Ambulatory Visit: Payer: Self-pay

## 2018-05-09 DIAGNOSIS — I083 Combined rheumatic disorders of mitral, aortic and tricuspid valves: Secondary | ICD-10-CM | POA: Insufficient documentation

## 2018-05-09 DIAGNOSIS — I119 Hypertensive heart disease without heart failure: Secondary | ICD-10-CM | POA: Diagnosis not present

## 2018-05-09 DIAGNOSIS — I4891 Unspecified atrial fibrillation: Secondary | ICD-10-CM | POA: Diagnosis not present

## 2018-05-09 DIAGNOSIS — I359 Nonrheumatic aortic valve disorder, unspecified: Secondary | ICD-10-CM | POA: Diagnosis not present

## 2018-05-09 DIAGNOSIS — I2510 Atherosclerotic heart disease of native coronary artery without angina pectoris: Secondary | ICD-10-CM | POA: Diagnosis not present

## 2018-05-09 DIAGNOSIS — J449 Chronic obstructive pulmonary disease, unspecified: Secondary | ICD-10-CM | POA: Insufficient documentation

## 2018-05-09 DIAGNOSIS — E785 Hyperlipidemia, unspecified: Secondary | ICD-10-CM | POA: Diagnosis not present

## 2018-05-09 NOTE — Progress Notes (Signed)
Remote pacemaker transmission.   

## 2018-05-13 ENCOUNTER — Other Ambulatory Visit: Payer: Self-pay | Admitting: Internal Medicine

## 2018-05-26 ENCOUNTER — Ambulatory Visit: Payer: Medicare Other | Admitting: Internal Medicine

## 2018-06-01 ENCOUNTER — Telehealth: Payer: Self-pay | Admitting: Hematology

## 2018-06-01 NOTE — Telephone Encounter (Signed)
YF PAL 9/19. Moved appointments to 10/17. Left message for patient. Schedule mailed.

## 2018-06-08 ENCOUNTER — Ambulatory Visit: Payer: Medicare Other | Admitting: Hematology

## 2018-06-16 LAB — CUP PACEART REMOTE DEVICE CHECK
Brady Statistic AP VS Percent: 5 %
Brady Statistic AS VP Percent: 0 %
Brady Statistic AS VS Percent: 95 %
Date Time Interrogation Session: 20190927081206
Implantable Lead Implant Date: 20141212
Implantable Lead Implant Date: 20141212
Implantable Lead Location: 753859
Implantable Lead Serial Number: 29409274
Implantable Pulse Generator Implant Date: 20141212
Lead Channel Impedance Value: 799 Ohm
MDC IDC LEAD LOCATION: 753860
MDC IDC LEAD SERIAL: 29535412
MDC IDC MSMT BATTERY REMAINING PERCENTAGE: 70 %
MDC IDC MSMT LEADCHNL RA IMPEDANCE VALUE: 540 Ohm
MDC IDC SET LEADCHNL RA PACING AMPLITUDE: 2 V
MDC IDC SET LEADCHNL RV PACING AMPLITUDE: 2.6 V
MDC IDC SET LEADCHNL RV PACING PULSEWIDTH: 0.75 ms
MDC IDC STAT BRADY AP VP PERCENT: 0 %
MDC IDC STAT BRADY RA PERCENT PACED: 5 %
MDC IDC STAT BRADY RV PERCENT PACED: 0 %
Pulse Gen Serial Number: 68181424

## 2018-06-19 ENCOUNTER — Encounter: Payer: Self-pay | Admitting: *Deleted

## 2018-06-19 ENCOUNTER — Telehealth: Payer: Self-pay | Admitting: Cardiology

## 2018-06-19 NOTE — Telephone Encounter (Signed)
Spoke with pt, letter generated and sent to the patient via my chart.

## 2018-06-19 NOTE — Telephone Encounter (Signed)
° °  Please call patient regarding MyChart message; letter requested

## 2018-06-19 NOTE — Telephone Encounter (Signed)
Spoke who states she is requesting to speak to Hilda Blades in regards to a letter she is needing. Informed that a message would be sent to have her call pt back.

## 2018-06-28 ENCOUNTER — Ambulatory Visit (INDEPENDENT_AMBULATORY_CARE_PROVIDER_SITE_OTHER): Payer: Medicare Other | Admitting: Internal Medicine

## 2018-06-28 ENCOUNTER — Encounter: Payer: Self-pay | Admitting: Internal Medicine

## 2018-06-28 VITALS — BP 126/78 | HR 88 | Temp 97.8°F | Ht 65.5 in | Wt 266.0 lb

## 2018-06-28 DIAGNOSIS — J4520 Mild intermittent asthma, uncomplicated: Secondary | ICD-10-CM

## 2018-06-28 DIAGNOSIS — I10 Essential (primary) hypertension: Secondary | ICD-10-CM

## 2018-06-28 DIAGNOSIS — R7302 Impaired glucose tolerance (oral): Secondary | ICD-10-CM | POA: Diagnosis not present

## 2018-06-28 DIAGNOSIS — I2510 Atherosclerotic heart disease of native coronary artery without angina pectoris: Secondary | ICD-10-CM

## 2018-06-28 DIAGNOSIS — Z23 Encounter for immunization: Secondary | ICD-10-CM | POA: Diagnosis not present

## 2018-06-28 DIAGNOSIS — E039 Hypothyroidism, unspecified: Secondary | ICD-10-CM | POA: Diagnosis not present

## 2018-06-28 NOTE — Patient Instructions (Addendum)
You had the flu shot today  Please continue all other medications as before, and refills have been done if requested.  Please have the pharmacy call with any other refills you may need.  Please continue your efforts at being more active, low cholesterol diet, and weight control.  Please keep your appointments with your specialists as you may have planned  Please return in 6 months, or sooner if needed

## 2018-06-28 NOTE — Assessment & Plan Note (Signed)
stable overall by history and exam, recent data reviewed with pt, and pt to continue medical treatment as before,  to f/u any worsening symptoms or concerns  

## 2018-06-28 NOTE — Progress Notes (Signed)
Subjective:    Patient ID: [name] [name], female    DOB: [birth year], 72 y.o.   MRN: [id number]  HPI  Here to f/u; overall doing ok,  Pt denies chest pain, increasing sob or doe, wheezing, orthopnea, PND, increased LE swelling, palpitations, dizziness or syncope.  Pt denies new neurological symptoms such as new headache, or facial or extremity weakness or numbness.  Pt denies polydipsia, polyuria, or low sugar episode.  Pt states overall good compliance with meds, mostly trying to follow appropriate diet, with wt overall stable,  but little exercise however.  Plans to try to do more.  Denies hyper or hypo thyroid symptoms such as voice, skin or hair change. Wt Readings from Last 3 Encounters:  06/28/18 266 lb (120.7 kg)  04/28/18 271 lb (122.9 kg)  03/22/18 269 lb (122 kg)   Past Medical History:  Diagnosis Date  . Anemia   . Asthma 06/13/2011   hx of  . Atrial fibrillation ([place])   . Atrial flutter ([place])   . Breast cancer ([place]) dx'd 2005   No BP check or stick in Left arm  . CAD (coronary artery disease)    a. Nonobstructive CAD 2007 (EF 75%.  RCA  proximal 75% tubular, 25% prox LAD, 25% d1, 50% D2) at [facility]. b. NSTEMI 01/2010 in setting of respiratory failure, medical approach (not a good candidate for noninvasive eval)  . Cellulitis    hx of cellulitis in left leg  . Cervical radiculopathy 06/13/2011  . COPD (chronic obstructive pulmonary disease) ([place])    on O2  . Depression 06/13/2011  . Diverticulosis   . Esophageal dysmotility   . Gout 06/13/2011  . History of Clostridium difficile early dec 2015   no current diarrhea  . History of home oxygen therapy    2 liters per nasal cannula all the time  . Hx of dysfunctional uterine bleeding    last bleeding jan 2016, worked up for with d and c x 2 no cause found  . Hyperlipidemia   . Hypertension   . Hypothyroidism   . Impaired glucose tolerance 11/01/2012  . Neuropathy 06/13/2011  . Obesity hypoventilation syndrome ([place]) 06/13/2011    . OSA on CPAP   . Personal history of chemotherapy   . Personal history of radiation therapy   . PFO (patent foramen ovale)   . Risk for falls   . Syncope and collapse    Bradycardia with pauses. S/P pacemaker  . Tubular adenoma of colon    Past Surgical History:  Procedure Laterality Date  . BREAST BIOPSY    . BREAST LUMPECTOMY Left 05/2004  . CARDIOVERSION N/A 03/15/2013   Procedure: CARDIOVERSION;  Surgeon: Thayer Headings, MD;  Location: [facility] ENDOSCOPY;  Service: Cardiovascular;  Laterality: N/A;  . CERVICAL BIOPSY  June 2013   at [facility] for Heavy  Bleeding  . [place] SURGERY  2004 or 2005   have cadaver bones,, screws, and plates c 5 to c 6  . COLONOSCOPY    . COLONOSCOPY WITH PROPOFOL N/A 10/22/2014   Procedure: COLONOSCOPY WITH PROPOFOL;  Surgeon: Jerene Bears, MD;  Location: [facility] ENDOSCOPY;  Service: Gastroenterology;  Laterality: N/A;  . DILATION AND CURETTAGE OF UTERUS    . LAPAROSCOPIC APPENDECTOMY  03/29/2012   Procedure: APPENDECTOMY LAPAROSCOPIC;  Surgeon: Adin Hector, MD;  Location: [facility] ORS;  Service: General;  Laterality: N/A;  . LOOP RECORDER EXPLANT  08/31/2013   Procedure: LOOP RECORDER EXPLANT;  Surgeon: Nelda Severe  Allred, MD;  Location: [place] CATH LAB;  Service: Cardiovascular;;  . LOOP RECORDER IMPLANT  08-28-2013; 08-31-2013   MDT LinQ implanted by Dr Rayann Heman for syncope; explanted 08-31-2013 after sinus pauses identified  . LOOP RECORDER IMPLANT N/A 08/28/2013   Procedure: LOOP RECORDER IMPLANT;  Surgeon: Coralyn Mark, MD;  Location: [place] CATH LAB;  Service: Cardiovascular;  Laterality: N/A;  . OVARIAN CYST REMOVAL    . PACEMAKER INSERTION  08-31-2013   Biotronik Evera dual chamber pacemaker placed for neurocardiogenic syncope and sinus pauses by Dr Rayann Heman  . PERMANENT PACEMAKER INSERTION N/A 08/31/2013   Procedure: PERMANENT PACEMAKER INSERTION;  Surgeon: Coralyn Mark, MD;  Location: [place] CATH LAB;  Service: Cardiovascular;  Laterality: N/A;  . TEE WITHOUT  CARDIOVERSION N/A 03/15/2013   Procedure: TRANSESOPHAGEAL ECHOCARDIOGRAM (TEE);  Surgeon: Thayer Headings, MD;  Location: [facility] ENDOSCOPY;  Service: Cardiovascular;  Laterality: N/A;    reports that she quit smoking about 19 years ago. Her smoking use included cigarettes. She has a 20.00 pack-year smoking history. She has never used smokeless tobacco. She reports that she drinks about 1.0 standard drinks of alcohol per week. She reports that she does not use drugs. family history includes Breast cancer in her maternal aunt; Breast cancer (age of onset: 4) in her sister; Breast cancer (age of onset: 27) in her cousin; Diabetes in her brother; Heart disease in her mother; Ovarian cancer in her sister and sister; Uterine cancer in her mother. Allergies  Allergen Reactions  . Ciprofloxacin Other (See Comments)    foliculitis  . Cephalexin Other (See Comments)    Exfoliative dermatitis reaction  . Codeine     REACTION: unknown - pt. states unaware of having reaction to codeine  . Dilaudid [Hydromorphone] Other (See Comments)    Other Reaction: Oversedation  . Lisinopril     REACTION: cough  . Penicillins     rash  . Cleocin [Clindamycin Hcl] Rash    rash  . Doxycycline Rash   Current Outpatient Medications on File Prior to Visit  Medication Sig Dispense Refill  . acetaminophen (TYLENOL) 325 MG tablet Take as needed per bottle    . allopurinol (ZYLOPRIM) 300 MG tablet TAKE 1 TABLET DAILY 90 tablet 1  . amitriptyline (ELAVIL) 100 MG tablet TAKE 1 TABLET BY MOUTH AT BEDTIME 30 tablet 2  . atorvastatin (LIPITOR) 40 MG tablet Take 1 tablet (40 mg total) by mouth daily. 90 tablet 3  . budesonide-formoterol (SYMBICORT) 160-4.5 MCG/ACT inhaler Inhale 2 puffs into the lungs 2 (two) times daily. 1 Inhaler 0  . Cholecalciferol (VITAMIN D) 2000 UNITS tablet Take 2,000 Units by mouth at bedtime.    . clonazePAM (KLONOPIN) 0.5 MG tablet TAKE 1 TABLET BY MOUTH TWICE A DAY IF NEEDED FOR ANXIETY 60 tablet 2    . dextromethorphan-guaiFENesin (MUCINEX DM) 30-600 MG 12hr tablet Take 1 tablet by mouth 2 (two) times daily as needed for cough.    . DULoxetine (CYMBALTA) 30 MG capsule Take 2 capsules (60 mg total) by mouth at bedtime. 2 at bedtime 180 capsule 3  . ELIQUIS 5 MG TABS tablet TAKE 1 TABLET TWICE A DAY 180 tablet 0  . famotidine (PEPCID) 20 MG tablet Take 20 mg by mouth at bedtime.    . furosemide (LASIX) 40 MG tablet TAKE 1 TABLET EVERY MORNING AND 1 TABLET EVERY OTHER NIGHT 180 tablet 1  . hydrOXYzine (ATARAX/VISTARIL) 25 MG tablet Take 1 tablet (25 mg total) by mouth 3 (three) times daily  as needed. 60 tablet 1  . levothyroxine (SYNTHROID, LEVOTHROID) 200 MCG tablet TAKE 1 TABLET DAILY 90 tablet 3  . metoprolol tartrate (LOPRESSOR) 25 MG tablet Take 1 tablet (25 mg total) by mouth 2 (two) times daily. 180 tablet 2  . omeprazole (PRILOSEC) 40 MG capsule TAKE 1 CAPSULE DAILY 90 capsule 0  . OXYGEN 2lpm with CPAP at night and with exertion    . potassium chloride SA (K-DUR,KLOR-CON) 20 MEQ tablet TAKE 1 TABLET TWICE A DAY 180 tablet 3  . PROAIR HFA 108 (90 Base) MCG/ACT inhaler Inhale 2 puffs into the lungs every 4 (four) hours as needed for wheezing or shortness of breath. 1 Inhaler 4  . traMADol (ULTRAM) 50 MG tablet Take 1 tablet (50 mg total) by mouth every 8 (eight) hours as needed (pain). 90 tablet 5  . UNABLE TO FIND Med Name:Cpap at night with 2lpm o2    . vitamin C (ASCORBIC ACID) 500 MG tablet Take 500 mg by mouth daily.      No current facility-administered medications on file prior to visit.    Review of Systems  Constitutional: Negative for other unusual diaphoresis or sweats HENT: Negative for ear discharge or swelling Eyes: Negative for other worsening visual disturbances Respiratory: Negative for stridor or other swelling  Gastrointestinal: Negative for worsening distension or other blood Genitourinary: Negative for retention or other urinary change Musculoskeletal: Negative  for other MSK pain or swelling Skin: Negative for color change or other new lesions Neurological: Negative for worsening tremors and other numbness  Psychiatric/Behavioral: Negative for worsening agitation or other fatigue All other system neg per pt    Objective:   Physical Exam BP 126/78   Pulse 88   Temp 97.8 F (36.6 C) (Oral)   Ht 5' 5.5" (1.664 m)   Wt 266 lb (120.7 kg)   SpO2 93%   BMI 43.59 kg/m  VS noted, obese Constitutional: Pt appears in NAD HENT: Head: NCAT.  Right Ear: External ear normal.  Left Ear: External ear normal.  Eyes: . Pupils are equal, round, and reactive to light. Conjunctivae and EOM are normal Nose: without d/c or deformity Neck: Neck supple. Gross normal ROM Cardiovascular: Normal rate and regular rhythm.   Pulmonary/Chest: Effort normal and breath sounds without rales or wheezing.  Abd:  Soft, NT, ND, + BS, no organomegaly Neurological: Pt is alert. At baseline orientation, motor grossly intact Skin: Skin is warm. No rashes, other new lesions, no LE edema Psychiatric: Pt behavior is normal without agitation  No other exam findings  Lab Results  Component Value Date   WBC 4.7 04/28/2018   HGB 11.3 04/28/2018   HCT 34.6 04/28/2018   PLT 188 04/28/2018   GLUCOSE 82 04/28/2018   CHOL 139 11/24/2017   TRIG 137 11/24/2017   HDL 48 11/24/2017   LDLCALC 64 11/24/2017   ALT 14 11/24/2017   AST 20 11/24/2017   NA 146 (H) 04/28/2018   K 4.0 04/28/2018   CL 106 04/28/2018   CREATININE 0.87 04/28/2018   BUN 14 04/28/2018   CO2 27 04/28/2018   TSH 0.76 05/12/2017   INR 1.08 09/02/2016   HGBA1C 5.6 05/12/2017        Assessment & Plan:

## 2018-07-05 ENCOUNTER — Other Ambulatory Visit: Payer: Self-pay

## 2018-07-05 ENCOUNTER — Telehealth: Payer: Self-pay | Admitting: Hematology

## 2018-07-05 DIAGNOSIS — Z853 Personal history of malignant neoplasm of breast: Secondary | ICD-10-CM

## 2018-07-05 NOTE — Progress Notes (Signed)
[name] [name] OFFICE PROGRESS NOTE   Biagio Borg, MD [place] [address]  DIAGNOSIS: History of left breast cancer  Estrogen deficiency - Plan: DG Bone Density  Encounter for screening mammogram for breast cancer - Plan: MM Digital Screening  No chief complaint on file.      CURRENT THERAPY: Surveillance  INTERVAL HISTORY:  [name] [name] 72 y.o. female with history of infiltrating ductal carcinoma of the left breast (August 2006) who received all of her care for her breast cancer at [facility] is here for followup. She was last seen by me 1 year ago. Her 2019 mammogram and breast US were unremarkable. She has been following up with internist Dr. Jenny Reichmann in the interim.  Today she presents to the clinic by herself. She notes she feels better overall. She is no longer on needed oxygen and uses C Pap machine at night. She notes she lost over 100 pounds over the past year due to change in diet and exercise. She notes she no longer has low back pain.  She notes she felt a lump in her right breast from a sebaceous cyst.  She notes her BP was lower than usual today and she feels off, but denies dizziness and notes to eating today. She does monitor her BP at home.  She notes she received her flu shot last week.       MEDICAL HISTORY: Past Medical History:  Diagnosis Date  . Anemia   . Asthma 06/13/2011   hx of  . Atrial fibrillation ([place])   . Atrial flutter ([place])   . Breast cancer ([place]) dx'd 2005   No BP check or stick in Left arm  . CAD (coronary artery disease)    a. Nonobstructive CAD 2007 (EF 75%.  RCA  proximal 75% tubular, 25% prox LAD, 25% d1, 50% D2) at [facility]. b. NSTEMI 01/2010 in setting of respiratory failure, medical approach (not a good candidate for noninvasive eval)  . Cellulitis    hx of cellulitis in left leg  . Cervical radiculopathy 06/13/2011  . COPD (chronic obstructive pulmonary disease) ([place])    on O2  . Depression 06/13/2011   . Diverticulosis   . Esophageal dysmotility   . Gout 06/13/2011  . History of Clostridium difficile early dec 2015   no current diarrhea  . History of home oxygen therapy    2 liters per nasal cannula all the time  . Hx of dysfunctional uterine bleeding    last bleeding jan 2016, worked up for with d and c x 2 no cause found  . Hyperlipidemia   . Hypertension   . Hypothyroidism   . Impaired glucose tolerance 11/01/2012  . Neuropathy 06/13/2011  . Obesity hypoventilation syndrome ([place]) 06/13/2011  . OSA on CPAP   . Personal history of chemotherapy   . Personal history of radiation therapy   . PFO (patent foramen ovale)   . Risk for falls   . Syncope and collapse    Bradycardia with pauses. S/P pacemaker  . Tubular adenoma of colon     ALLERGIES:  is allergic to ciprofloxacin; cephalexin; codeine; dilaudid [hydromorphone]; lisinopril; penicillins; cleocin [clindamycin hcl]; and doxycycline.  MEDICATIONS: Current Outpatient Medications on File Prior to Visit  Medication Sig Dispense Refill  . acetaminophen (TYLENOL) 325 MG tablet Take as needed per bottle    . allopurinol (ZYLOPRIM) 300 MG tablet TAKE 1 TABLET DAILY 90 tablet 1  . amitriptyline (ELAVIL)  100 MG tablet TAKE 1 TABLET BY MOUTH AT BEDTIME 30 tablet 2  . atorvastatin (LIPITOR) 40 MG tablet Take 1 tablet (40 mg total) by mouth daily. 90 tablet 3  . budesonide-formoterol (SYMBICORT) 160-4.5 MCG/ACT inhaler Inhale 2 puffs into the lungs 2 (two) times daily. 1 Inhaler 0  . Cholecalciferol (VITAMIN D) 2000 UNITS tablet Take 2,000 Units by mouth at bedtime.    . clonazePAM (KLONOPIN) 0.5 MG tablet TAKE 1 TABLET BY MOUTH TWICE A DAY IF NEEDED FOR ANXIETY 60 tablet 2  . dextromethorphan-guaiFENesin (MUCINEX DM) 30-600 MG 12hr tablet Take 1 tablet by mouth 2 (two) times daily as needed for cough.    . DULoxetine (CYMBALTA) 30 MG capsule Take 2 capsules (60 mg total) by mouth at bedtime. 2 at bedtime 180 capsule 3  . ELIQUIS 5 MG  TABS tablet TAKE 1 TABLET TWICE A DAY 180 tablet 0  . famotidine (PEPCID) 20 MG tablet Take 20 mg by mouth at bedtime.    . furosemide (LASIX) 40 MG tablet TAKE 1 TABLET EVERY MORNING AND 1 TABLET EVERY OTHER NIGHT 180 tablet 1  . hydrOXYzine (ATARAX/VISTARIL) 25 MG tablet Take 1 tablet (25 mg total) by mouth 3 (three) times daily as needed. 60 tablet 1  . levothyroxine (SYNTHROID, LEVOTHROID) 200 MCG tablet TAKE 1 TABLET DAILY 90 tablet 3  . metoprolol tartrate (LOPRESSOR) 25 MG tablet Take 1 tablet (25 mg total) by mouth 2 (two) times daily. 180 tablet 2  . omeprazole (PRILOSEC) 40 MG capsule TAKE 1 CAPSULE DAILY 90 capsule 0  . OXYGEN 2lpm with CPAP at night and with exertion    . potassium chloride SA (K-DUR,KLOR-CON) 20 MEQ tablet TAKE 1 TABLET TWICE A DAY 180 tablet 3  . PROAIR HFA 108 (90 Base) MCG/ACT inhaler Inhale 2 puffs into the lungs every 4 (four) hours as needed for wheezing or shortness of breath. 1 Inhaler 4  . traMADol (ULTRAM) 50 MG tablet Take 1 tablet (50 mg total) by mouth every 8 (eight) hours as needed (pain). 90 tablet 5  . UNABLE TO FIND Med Name:Cpap at night with 2lpm o2    . vitamin C (ASCORBIC ACID) 500 MG tablet Take 500 mg by mouth daily.      No current facility-administered medications on file prior to visit.     SURGICAL HISTORY:  Past Surgical History:  Procedure Laterality Date  . BREAST BIOPSY    . BREAST LUMPECTOMY Left 05/2004  . CARDIOVERSION N/A 03/15/2013   Procedure: CARDIOVERSION;  Surgeon: Thayer Headings, MD;  Location: [facility] ENDOSCOPY;  Service: Cardiovascular;  Laterality: N/A;  . CERVICAL BIOPSY  June 2013   at [facility] for Heavy  Bleeding  . [place] SURGERY  2004 or 2005   have cadaver bones,, screws, and plates c 5 to c 6  . COLONOSCOPY    . COLONOSCOPY WITH PROPOFOL N/A 10/22/2014   Procedure: COLONOSCOPY WITH PROPOFOL;  Surgeon: Jerene Bears, MD;  Location: [facility] ENDOSCOPY;  Service: Gastroenterology;  Laterality: N/A;  . DILATION AND  CURETTAGE OF UTERUS    . LAPAROSCOPIC APPENDECTOMY  03/29/2012   Procedure: APPENDECTOMY LAPAROSCOPIC;  Surgeon: Adin Hector, MD;  Location: [facility] ORS;  Service: General;  Laterality: N/A;  . LOOP RECORDER EXPLANT  08/31/2013   Procedure: LOOP RECORDER EXPLANT;  Surgeon: Coralyn Mark, MD;  Location: [facility] CATH LAB;  Service: Cardiovascular;;  . LOOP RECORDER IMPLANT  08-28-2013; 08-31-2013   MDT LinQ implanted by Dr Rayann Heman for  syncope; explanted 08-31-2013 after sinus pauses identified  . LOOP RECORDER IMPLANT N/A 08/28/2013   Procedure: LOOP RECORDER IMPLANT;  Surgeon: Coralyn Mark, MD;  Location: [place] CATH LAB;  Service: Cardiovascular;  Laterality: N/A;  . OVARIAN CYST REMOVAL    . PACEMAKER INSERTION  08-31-2013   Biotronik Evera dual chamber pacemaker placed for neurocardiogenic syncope and sinus pauses by Dr Rayann Heman  . PERMANENT PACEMAKER INSERTION N/A 08/31/2013   Procedure: PERMANENT PACEMAKER INSERTION;  Surgeon: Coralyn Mark, MD;  Location: [place] CATH LAB;  Service: Cardiovascular;  Laterality: N/A;  . TEE WITHOUT CARDIOVERSION N/A 03/15/2013   Procedure: TRANSESOPHAGEAL ECHOCARDIOGRAM (TEE);  Surgeon: Thayer Headings, MD;  Location: [place];  Service: Cardiovascular;  Laterality: N/A;    REVIEW OF SYSTEMS:   Constitutional: Denies fevers, chills or abnormal weight loss (+) Purposeful weight loss Eyes: Denies blurriness of vision Ears, nose, mouth, throat, and face: Denies mucositis or sore throat Respiratory: Denies cough or wheezes  Cardiovascular: Denies palpitation, chest discomfort Gastrointestinal:  Denies nausea, heartburn or change in bowel habits Skin: Denies abnormal skin rashes Lymphatics: Denies new lymphadenopathy or easy bruising Neurological: Negative Behavioral/Psych: Mood is stable, no new changes  All other systems were reviewed with the patient and are negative.  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  Pulse 96   Temp 98.1 F (36.7 C) (Oral)   Resp (!) 21   Ht 5' 5.5" (1.664 m)   Wt 261 lb 3.2 oz (118.5 kg)   SpO2 97%   BMI 42.80 kg/m    GENERAL:alert, no distress and comfortable; Morbidly obese, on [place] oxygen  SKIN: skin color, texture, turgor are normal, no rashes or significant lesions EYES: normal, Conjunctiva are pink and non-injected, sclera clear OROPHARYNX:no exudate, no erythema and lips, buccal mucosa, and tongue normal  NECK: supple, thyroid normal size, non-tender, without nodularity LYMPH:  no palpable lymphadenopathy in the cervical, axillary or supraclavicular LUNGS: clear to auscultation and percussion with normal breathing effort HEART: regular rate & rhythm and no murmurs and chronic pitting extremity edema  ABDOMEN:abdomen soft, non-tender and normal bowel sounds Musculoskeletal:no cyanosis of digits and no clubbing  NEURO: alert & oriented x 3 with fluent speech, no focal motor/sensory deficits BREASTS: Right breast is benign, no palpable mass. No axillary adenopathy. Left breast significant scar tissue at 10:00-11:00 position. Diffuse skin hyperpigmentation post radiation.   Labs:  CBC Latest Ref Rng & Units 07/06/2018 04/28/2018 11/24/2017  WBC 4.0 - 10.5 K/uL 6.0 4.7 4.8  Hemoglobin 12.0 - 15.0 g/dL 12.8 11.3 12.4  Hematocrit 36.0 - 46.0 % 40.3 34.6 38.2  Platelets 150 - 400 K/uL 188 188 217    CMP Latest Ref Rng & Units 07/06/2018 04/28/2018 11/24/2017  Glucose 70 - 99 mg/dL 92 82 83  BUN 8 - 23 mg/dL 9 14 9   Creatinine 0.44 - 1.00 mg/dL 0.99 0.87 0.96  Sodium 135 - 145 mmol/L 143 146(H) 142  Potassium 3.5 - 5.1 mmol/L 3.5 4.0 3.7  Chloride 98 - 111 mmol/L 105 106 95(L)  CO2 22 - 32 mmol/L 26 27 30(H)  Calcium 8.9 - 10.3 mg/dL 9.3 9.6 9.7  Total Protein 6.5 - 8.1 g/dL 7.6 - 7.3  Total Bilirubin 0.3 - 1.2 mg/dL 0.8 - 0.7  Alkaline Phos 38 - 126 U/L 126 - 104  AST 15 - 41 U/L 16 - 20  ALT 0 - 44  U/L 17  - 14     RADIOGRAPHIC STUDIES:  11/28/2017 Breast US IMPRESSION: No mammographic evidence for malignancy.  Palpable abnormality within the medial right breast favored to represent a sebaceous cyst.  11/28/2017 Mammogram IMPRESSION: No mammographic evidence for malignancy.  Palpable abnormality within the medial right breast favored to represent a sebaceous cyst.  Diagnostic left breast mammogram IMPRESSION: 1. Several loosely grouped punctate and coarse calcifications within the inner left breast at the surgical site, morphology better appreciated on the CC projection. These are probably benign calcifications for which a six-month follow-up left breast diagnostic mammogram, with magnification views, is recommended. 2. Left breast retroareolar asymmetry compatible with superimposition of normal dense fibroglandular tissues on today's additional views with tomosynthesis. Attention to this area also recommended on the six-month follow-up exam to ensure stability.  RECOMMENDATION: Follow-up left breast diagnostic mammogram, with possible ultrasound, in 6 months.  PATHOLOGY: Diagnosis Endometrium, biopsy - DEGENERATING SECRETORY-TYPE ENDOMETRIUM. - THERE IS NO EVIDENCE OF HYPERPLASIA OR MALIGNANCY. - SEE COMMENT. Microscopic Comment This pattern can be associated with menses, irregular shedding or breakthrough bleeding associated with hormonal therapy. Clinical correlation is suggested. (JBK:caf 05/14/13) RADIOLOGY REPORTS: MM DIAG BREAST TOMO UNI LEFT 12/02/2015 IMPRESSION: Unchanged probably benign left breast asymmetry and left breast calcifications. There are no mammographic findings of malignancy in the left breast.  RECOMMENDATION: Bilateral diagnostic mammography September 2017 to demonstrate 1 year stability of the probably benign left breast calcifications in left breast asymmetry  ASSESSMENT:  [name] [name] 72 y.o. female with a history of History of  left breast cancer  Estrogen deficiency - Plan: DG Bone Density  Encounter for screening mammogram for breast cancer - Plan: MM Digital Screening   1. T2N0, stage IIA triple negative infiltrating ductal carcinoma of the left breast (2006).  --She is now over 10 years from the time of diagnosis without evidence of disease.  -We discussed that the risk of cancer recurrence was triple-negative breast cancer is minimal now, but she is at risk for a second breast cancer and we will continue surveillance. -Her previous mammogram in March 2018 showed probably benign left breast asymmetry and calcification.  -She is clinically doing well. Lab reviewed, her CBC is WNL and CMP is still pending. Her physical exam and her 11/2017 mammogram were unremarkable and stable. There is no clinical concern for recurrence. -I encouraged annual mammogram which will be due in 11/2018 and self breast exam or breast exam with her PCP every 6 months.  -I encouraged her to continue healthy diet and a regular exercise.  -I will follow up with her in 1 year.  -She received her flu shot last week.  -We discussed her that it has been 13 years, her risk of recurrence is very low given the triple negative disease.  She prefers to continue follow-up with [place] annually.   2. Left breast calcification -This was found on the screening mammogram in September 2016, and diagnostic mammogram, stable in subsequent mammograms. -She will have annual mammogram due 11/2018, ordered today  3. Chronic low back pain, atrial fibrillation, CAD, COPD on home oxygen, morbid obesity,  -She will continue follow-up with her primary care physician, cardiologist and pulmonologist  -She has lost over 100 pounds in the last year and her back pain has resolved and is not on home oxygen as before. She uses C Pap machine at home.  -She notes to having lower BP today, at 119/52. I discussed with weight loss her BP can improve and  may not need current dose of  anti-hypertensive's. I encouraged her to monitor her BP at home and contact her PCP if her BP remains lower.  -I also recommend she hold Lasix for today.   4. Osteopenia  -DEXA 11/2016 revealed T-score -1.2 at right femur neck, indicating osteopenia -She is due for bone density scan in 11/2018.   Plan  -Lab and f/u in one year  -Mammogram and DEXA in 11/2018   All questions were answered. The patient knows to call the clinic with any problems, questions or concerns. We can certainly see the patient much sooner if necessary.  The patient was provided an after visit summary.   I spent 15 minutes counseling the patient face to face. The total time spent in the appointment was 20 minutes.  Oneal Deputy, am acting as scribe for Truitt Merle, MD.   I have reviewed the above documentation for accuracy and completeness, and I agree with the above.    Truitt Merle  07/06/2018

## 2018-07-05 NOTE — Telephone Encounter (Signed)
Appt scheduled patient notified per 10/16 sch msg

## 2018-07-06 ENCOUNTER — Inpatient Hospital Stay: Payer: Medicare Other

## 2018-07-06 ENCOUNTER — Encounter: Payer: Self-pay | Admitting: Hematology

## 2018-07-06 ENCOUNTER — Inpatient Hospital Stay: Payer: Medicare Other | Attending: Hematology | Admitting: Hematology

## 2018-07-06 VITALS — BP 119/52 | HR 96 | Temp 98.1°F | Resp 21 | Ht 65.5 in | Wt 261.2 lb

## 2018-07-06 DIAGNOSIS — I2510 Atherosclerotic heart disease of native coronary artery without angina pectoris: Secondary | ICD-10-CM | POA: Insufficient documentation

## 2018-07-06 DIAGNOSIS — Z1231 Encounter for screening mammogram for malignant neoplasm of breast: Secondary | ICD-10-CM

## 2018-07-06 DIAGNOSIS — Z853 Personal history of malignant neoplasm of breast: Secondary | ICD-10-CM

## 2018-07-06 DIAGNOSIS — I10 Essential (primary) hypertension: Secondary | ICD-10-CM | POA: Insufficient documentation

## 2018-07-06 DIAGNOSIS — Z9981 Dependence on supplemental oxygen: Secondary | ICD-10-CM | POA: Insufficient documentation

## 2018-07-06 DIAGNOSIS — Z7901 Long term (current) use of anticoagulants: Secondary | ICD-10-CM | POA: Insufficient documentation

## 2018-07-06 DIAGNOSIS — I4891 Unspecified atrial fibrillation: Secondary | ICD-10-CM

## 2018-07-06 DIAGNOSIS — E2839 Other primary ovarian failure: Secondary | ICD-10-CM

## 2018-07-06 DIAGNOSIS — J449 Chronic obstructive pulmonary disease, unspecified: Secondary | ICD-10-CM | POA: Diagnosis not present

## 2018-07-06 DIAGNOSIS — M8580 Other specified disorders of bone density and structure, unspecified site: Secondary | ICD-10-CM | POA: Diagnosis not present

## 2018-07-06 DIAGNOSIS — R921 Mammographic calcification found on diagnostic imaging of breast: Secondary | ICD-10-CM | POA: Diagnosis not present

## 2018-07-06 LAB — CBC WITH DIFFERENTIAL (CANCER CENTER ONLY)
Abs Immature Granulocytes: 0.01 10*3/uL (ref 0.00–0.07)
BASOS ABS: 0 10*3/uL (ref 0.0–0.1)
Basophils Relative: 1 %
Eosinophils Absolute: 0.4 10*3/uL (ref 0.0–0.5)
Eosinophils Relative: 7 %
HEMATOCRIT: 40.3 % (ref 36.0–46.0)
HEMOGLOBIN: 12.8 g/dL (ref 12.0–15.0)
Immature Granulocytes: 0 %
LYMPHS ABS: 2 10*3/uL (ref 0.7–4.0)
Lymphocytes Relative: 33 %
MCH: 29.2 pg (ref 26.0–34.0)
MCHC: 31.8 g/dL (ref 30.0–36.0)
MCV: 91.8 fL (ref 80.0–100.0)
MONO ABS: 0.4 10*3/uL (ref 0.1–1.0)
Monocytes Relative: 7 %
NEUTROS ABS: 3.2 10*3/uL (ref 1.7–7.7)
NRBC: 0 % (ref 0.0–0.2)
Neutrophils Relative %: 52 %
Platelet Count: 188 10*3/uL (ref 150–400)
RBC: 4.39 MIL/uL (ref 3.87–5.11)
RDW: 15.7 % — ABNORMAL HIGH (ref 11.5–15.5)
WBC: 6 10*3/uL (ref 4.0–10.5)

## 2018-07-06 LAB — CMP (CANCER CENTER ONLY)
ALT: 17 U/L (ref 0–44)
ANION GAP: 12 (ref 5–15)
AST: 16 U/L (ref 15–41)
Albumin: 3.7 g/dL (ref 3.5–5.0)
Alkaline Phosphatase: 126 U/L (ref 38–126)
BILIRUBIN TOTAL: 0.8 mg/dL (ref 0.3–1.2)
BUN: 9 mg/dL (ref 8–23)
CO2: 26 mmol/L (ref 22–32)
Calcium: 9.3 mg/dL (ref 8.9–10.3)
Chloride: 105 mmol/L (ref 98–111)
Creatinine: 0.99 mg/dL (ref 0.44–1.00)
GFR, EST NON AFRICAN AMERICAN: 56 mL/min — AB (ref >60)
GLUCOSE: 92 mg/dL (ref 70–99)
Potassium: 3.5 mmol/L (ref 3.5–5.1)
Sodium: 143 mmol/L (ref 135–145)
TOTAL PROTEIN: 7.6 g/dL (ref 6.5–8.1)

## 2018-07-22 ENCOUNTER — Other Ambulatory Visit: Payer: Self-pay | Admitting: Internal Medicine

## 2018-07-22 DIAGNOSIS — R7989 Other specified abnormal findings of blood chemistry: Secondary | ICD-10-CM

## 2018-08-07 ENCOUNTER — Ambulatory Visit (INDEPENDENT_AMBULATORY_CARE_PROVIDER_SITE_OTHER): Payer: Medicare Other

## 2018-08-07 DIAGNOSIS — I495 Sick sinus syndrome: Secondary | ICD-10-CM | POA: Diagnosis not present

## 2018-08-07 NOTE — Progress Notes (Signed)
Remote pacemaker transmission.   

## 2018-08-10 ENCOUNTER — Encounter: Payer: Self-pay | Admitting: Cardiology

## 2018-09-09 ENCOUNTER — Other Ambulatory Visit: Payer: Self-pay | Admitting: Internal Medicine

## 2018-09-10 ENCOUNTER — Emergency Department (HOSPITAL_COMMUNITY)
Admission: EM | Admit: 2018-09-10 | Discharge: 2018-09-10 | Disposition: A | Discharge status: Home / Self Care | Payer: Medicare Other | Attending: Emergency Medicine | Admitting: Emergency Medicine

## 2018-09-10 ENCOUNTER — Other Ambulatory Visit: Payer: Self-pay

## 2018-09-10 ENCOUNTER — Encounter (HOSPITAL_COMMUNITY): Payer: Self-pay

## 2018-09-10 DIAGNOSIS — I2510 Atherosclerotic heart disease of native coronary artery without angina pectoris: Secondary | ICD-10-CM | POA: Diagnosis not present

## 2018-09-10 DIAGNOSIS — Z7901 Long term (current) use of anticoagulants: Secondary | ICD-10-CM | POA: Diagnosis not present

## 2018-09-10 DIAGNOSIS — E039 Hypothyroidism, unspecified: Secondary | ICD-10-CM | POA: Insufficient documentation

## 2018-09-10 DIAGNOSIS — J449 Chronic obstructive pulmonary disease, unspecified: Secondary | ICD-10-CM | POA: Diagnosis not present

## 2018-09-10 DIAGNOSIS — J45909 Unspecified asthma, uncomplicated: Secondary | ICD-10-CM | POA: Diagnosis not present

## 2018-09-10 DIAGNOSIS — Z79899 Other long term (current) drug therapy: Secondary | ICD-10-CM | POA: Diagnosis not present

## 2018-09-10 DIAGNOSIS — I110 Hypertensive heart disease with heart failure: Secondary | ICD-10-CM | POA: Diagnosis not present

## 2018-09-10 DIAGNOSIS — I509 Heart failure, unspecified: Secondary | ICD-10-CM | POA: Insufficient documentation

## 2018-09-10 DIAGNOSIS — N390 Urinary tract infection, site not specified: Secondary | ICD-10-CM | POA: Diagnosis not present

## 2018-09-10 DIAGNOSIS — K439 Ventral hernia without obstruction or gangrene: Secondary | ICD-10-CM

## 2018-09-10 DIAGNOSIS — Z87891 Personal history of nicotine dependence: Secondary | ICD-10-CM | POA: Diagnosis not present

## 2018-09-10 DIAGNOSIS — R1013 Epigastric pain: Secondary | ICD-10-CM | POA: Diagnosis present

## 2018-09-10 LAB — URINALYSIS, ROUTINE W REFLEX MICROSCOPIC
BILIRUBIN URINE: NEGATIVE
GLUCOSE, UA: NEGATIVE mg/dL
Nitrite: POSITIVE — AB
PH: 5.5 (ref 5.0–8.0)
Specific Gravity, Urine: 1.02 (ref 1.005–1.030)

## 2018-09-10 LAB — URINALYSIS, MICROSCOPIC (REFLEX): Squamous Epithelial / LPF: >50 (ref 0–5)

## 2018-09-10 MED ORDER — NITROFURANTOIN MONOHYD MACRO 100 MG PO CAPS: 100.0000 mg | ORAL_CAPSULE | Freq: Two times a day (BID) | ORAL | 0 refills | Status: AC

## 2018-09-10 NOTE — Discharge Instructions (Addendum)
Take Macrobid as prescribed and complete the full course. You have a hernia and should follow up with general surgery, call the number provided.  IF you develop worsening pain, vomiting, your hernia will not go back in or is more painful- return to the ER for further evaluation.

## 2018-09-10 NOTE — ED Notes (Signed)
Pt stable, ambulatory, and verbalizes understanding of d/c instructions.   Pt unable to sign due to lack of signature pad.

## 2018-09-10 NOTE — ED Notes (Signed)
PA at bedsdie. Abdominal palpation,leads to reduction of hernia and pt staters pain reduced

## 2018-09-10 NOTE — ED Triage Notes (Signed)
Pt C/O abdominal bulge x several weeks and abdominal pain with movement, cough or eating.

## 2018-09-10 NOTE — ED Notes (Signed)
Pt moved to hall after discussed progression bed.

## 2018-09-10 NOTE — ED Notes (Signed)
Pt states pain at hernia site is better but abdominm sides still hurt

## 2018-09-10 NOTE — ED Provider Notes (Signed)
[facility] EMERGENCY DEPARTMENT Provider Note   CSN: [id number] Arrival date & time: 09/10/18  1212     History   Chief Complaint Chief Complaint  Patient presents with  . Abdominal Pain    HPI [name] [name] is a 72 y.o. female.  72 year old female presents with complaint of epigastric pain with a bulge in the epigastric area.  Patient states that she has noticed this bulge for the past few weeks, comes and goes, worse with coughing and sneezing, states that she has to hold the area to try to help improve her pain.  States the pain makes her feel short of breath at times.  Also reports her urine looks dark with a foul odor.  Denies chest pain or difficulty eating, nausea, vomiting, changes in bowel habits, fevers, chills.  No other complaints or concerns.     Past Medical History:  Diagnosis Date  . Anemia   . Asthma 06/13/2011   hx of  . Atrial fibrillation ([place])   . Atrial flutter ([place])   . Breast cancer ([place]) dx'd 2005   No BP check or stick in Left arm  . CAD (coronary artery disease)    a. Nonobstructive CAD 2007 (EF 75%.  RCA  proximal 75% tubular, 25% prox LAD, 25% d1, 50% D2) at [facility]. b. NSTEMI 01/2010 in setting of respiratory failure, medical approach (not a good candidate for noninvasive eval)  . Cellulitis    hx of cellulitis in left leg  . Cervical radiculopathy 06/13/2011  . COPD (chronic obstructive pulmonary disease) ([place])    on O2  . Depression 06/13/2011  . Diverticulosis   . Esophageal dysmotility   . Gout 06/13/2011  . History of Clostridium difficile early dec 2015   no current diarrhea  . History of home oxygen therapy    2 liters per nasal cannula all the time  . Hx of dysfunctional uterine bleeding    last bleeding jan 2016, worked up for with d and c x 2 no cause found  . Hyperlipidemia   . Hypertension   . Hypothyroidism   . Impaired glucose tolerance 11/01/2012  . Neuropathy 06/13/2011  . Obesity hypoventilation syndrome  ([place]) 06/13/2011  . OSA on CPAP   . Personal history of chemotherapy   . Personal history of radiation therapy   . PFO (patent foramen ovale)   . Risk for falls   . Syncope and collapse    Bradycardia with pauses. S/P pacemaker  . Tubular adenoma of colon     Patient Active Problem List   Diagnosis Date Noted  . Anxiety 03/22/2018  . COPD with acute exacerbation ([place]) 12/28/2017  . Chronic respiratory failure with hypoxia and hypercapnia ([place]) 11/04/2016  . Tinea 09/26/2016  . Dyspnea on exertion 09/21/2016  . Morbid (severe) obesity due to excess calories ([place]) 09/21/2016  . History of left breast cancer 06/08/2016  . Panic attacks 04/27/2016  . Chest pain 04/11/2016  . OSA (obstructive sleep apnea) 02/12/2016  . Adhesive capsulitis of right shoulder 08/21/2015  . Right shoulder pain 07/16/2015  . Osteoarthritis, hand 07/16/2015  . Hair loss 07/16/2015  . Blood in mouth of unknown source 05/06/2015  . Dysphagia, pharyngoesophageal phase 05/06/2015  . Chronic respiratory failure ([place]) 10/28/2014  . Cellulitis of female breast 10/25/2014  . Rash and nonspecific skin eruption 10/25/2014  . Hx of colonic polyps   . Benign neoplasm of cecum   . Benign neoplasm of transverse colon   .  CHF (congestive heart failure) ([place])   . History of Clostridium difficile 06/27/2014  . RUQ pain 05/31/2014  . Lump in the abdomen 05/31/2014  . Diarrhea 05/14/2014  . Abdominal pain, other specified site 05/14/2014  . Pacemaker 12/03/2013  . Exfoliative dermatitis 11/27/2013  . Urinary urgency 11/13/2013  . Post-menopausal bleeding 11/13/2013  . Syncope and collapse 08/30/2013  . Atrial fibrillation ([place])   . Syncope 08/26/2013  . Postmenopausal vaginal bleeding 05/02/2013  . Tachy-brady syndrome ([place]) 03/15/2013  . Acute diastolic CHF (congestive heart failure) ([place]) 03/15/2013  . PFO (patent foramen ovale) 03/15/2013  . Hypokalemia 03/15/2013  . Atrial flutter ([place]) 03/01/2013  .  Tachycardia 03/01/2013  . Anticoagulant long-term use 03/01/2013  . Impaired glucose tolerance 11/01/2012  . Abnormal liver function tests 11/01/2012  . Right lumbar radiculopathy 04/28/2012  . Cough 04/15/2012  . Abnormal TSH 04/15/2012  . Acute appendicitis 03/29/2012  . Sick sinus syndrome ([place]) 03/08/2012  . Morbid obesity with BMI of 50.0-59.9, adult ([place]) 03/08/2012  . Breast cancer ([place]) 06/13/2011  . Asthma 06/13/2011  . Depression 06/13/2011  . Colon polyps 06/13/2011  . Neuropathy 06/13/2011  . Cervical radiculopathy 06/13/2011  . Gout 06/13/2011  . Obesity hypoventilation syndrome ([place]) 06/13/2011  . Cellulitis of leg, right 06/13/2011  . Preventative health care 06/04/2011  . OXYGEN-USE OF SUPPLEMENTAL 04/07/2010  . ACUT MI SUBENDOCARDIAL INFARCT SUBSQT EPIS CARE 03/13/2010  . COPD GOLD III 03/10/2010  . Sleep apnea 03/10/2010  . Hypothyroidism, acquired 03/17/2009  . Hyperlipidemia 03/17/2009  . Essential hypertension 03/17/2009  . Coronary atherosclerosis 03/17/2009    Past Surgical History:  Procedure Laterality Date  . BREAST BIOPSY    . BREAST LUMPECTOMY Left 05/2004  . CARDIOVERSION N/A 03/15/2013   Procedure: CARDIOVERSION;  Surgeon: Thayer Headings, MD;  Location: [facility] ENDOSCOPY;  Service: Cardiovascular;  Laterality: N/A;  . CERVICAL BIOPSY  June 2013   at [facility] for Heavy  Bleeding  . [place] SURGERY  2004 or 2005   have cadaver bones,, screws, and plates c 5 to c 6  . COLONOSCOPY    . COLONOSCOPY WITH PROPOFOL N/A 10/22/2014   Procedure: COLONOSCOPY WITH PROPOFOL;  Surgeon: Jerene Bears, MD;  Location: [facility] ENDOSCOPY;  Service: Gastroenterology;  Laterality: N/A;  . DILATION AND CURETTAGE OF UTERUS    . LAPAROSCOPIC APPENDECTOMY  03/29/2012   Procedure: APPENDECTOMY LAPAROSCOPIC;  Surgeon: Adin Hector, MD;  Location: [facility] ORS;  Service: General;  Laterality: N/A;  . LOOP RECORDER EXPLANT  08/31/2013   Procedure: LOOP RECORDER EXPLANT;  Surgeon:  Coralyn Mark, MD;  Location: [facility] CATH LAB;  Service: Cardiovascular;;  . LOOP RECORDER IMPLANT  08-28-2013; 08-31-2013   MDT LinQ implanted by Dr Rayann Heman for syncope; explanted 08-31-2013 after sinus pauses identified  . LOOP RECORDER IMPLANT N/A 08/28/2013   Procedure: LOOP RECORDER IMPLANT;  Surgeon: Coralyn Mark, MD;  Location: [facility] CATH LAB;  Service: Cardiovascular;  Laterality: N/A;  . OVARIAN CYST REMOVAL    . PACEMAKER INSERTION  08-31-2013   Biotronik Evera dual chamber pacemaker placed for neurocardiogenic syncope and sinus pauses by Dr Rayann Heman  . PERMANENT PACEMAKER INSERTION N/A 08/31/2013   Procedure: PERMANENT PACEMAKER INSERTION;  Surgeon: Coralyn Mark, MD;  Location: [facility] CATH LAB;  Service: Cardiovascular;  Laterality: N/A;  . TEE WITHOUT CARDIOVERSION N/A 03/15/2013   Procedure: TRANSESOPHAGEAL ECHOCARDIOGRAM (TEE);  Surgeon: Thayer Headings, MD;  Location: [facility];  Service: Cardiovascular;  Laterality: N/A;  OB History   No obstetric history on file.      Home Medications    Prior to Admission medications   Medication Sig Start Date End Date Taking? Authorizing Provider  acetaminophen (TYLENOL) 325 MG tablet Take as needed per bottle    [provider]  allopurinol (ZYLOPRIM) 300 MG tablet TAKE 1 TABLET DAILY 05/17/17   Biagio Borg, MD  amitriptyline (ELAVIL) 100 MG tablet TAKE 1 TABLET BY MOUTH AT BEDTIME 04/18/18   Biagio Borg, MD  atorvastatin (LIPITOR) 40 MG tablet Take 1 tablet (40 mg total) by mouth daily. 10/13/17 10/13/18  Lelon Perla, MD  budesonide-formoterol (SYMBICORT) 160-4.5 MCG/ACT inhaler Inhale 2 puffs into the lungs 2 (two) times daily. 12/27/17   Tanda Rockers, MD  Cholecalciferol (VITAMIN D) 2000 UNITS tablet Take 2,000 Units by mouth at bedtime.    [provider]  clonazePAM (KLONOPIN) 0.5 MG tablet TAKE 1 TABLET BY MOUTH TWICE A DAY IF NEEDED FOR ANXIETY 03/13/18   Biagio Borg, MD  dextromethorphan-guaiFENesin  [facility] DM) 30-600 MG 12hr tablet Take 1 tablet by mouth 2 (two) times daily as needed for cough.    [provider]  DULoxetine (CYMBALTA) 30 MG capsule Take 2 capsules (60 mg total) by mouth at bedtime. 2 at bedtime 11/23/17   Biagio Borg, MD  ELIQUIS 5 MG TABS tablet TAKE 1 TABLET TWICE A DAY 08/15/17   Biagio Borg, MD  famotidine (PEPCID) 20 MG tablet Take 20 mg by mouth at bedtime.    [provider]  furosemide (LASIX) 40 MG tablet TAKE 1 TABLET EVERY MORNING AND 1 TABLET EVERY OTHER NIGHT 07/24/18   Biagio Borg, MD  hydrOXYzine (ATARAX/VISTARIL) 25 MG tablet Take 1 tablet (25 mg total) by mouth 3 (three) times daily as needed. 12/09/17   Biagio Borg, MD  levothyroxine (SYNTHROID, LEVOTHROID) 200 MCG tablet TAKE 1 TABLET DAILY 05/15/18   Biagio Borg, MD  metoprolol tartrate (LOPRESSOR) 25 MG tablet Take 1 tablet (25 mg total) by mouth 2 (two) times daily. 12/12/17   Biagio Borg, MD  nitrofurantoin, macrocrystal-monohydrate, (MACROBID) 100 MG capsule Take 1 capsule (100 mg total) by mouth 2 (two) times daily for 5 days. 09/10/18 09/15/18  Tacy Learn, PA-C  omeprazole (PRILOSEC) 40 MG capsule TAKE 1 CAPSULE DAILY 01/30/18   Biagio Borg, MD  OXYGEN 2lpm with CPAP at night and with exertion    [provider]  potassium chloride SA (K-DUR,KLOR-CON) 20 MEQ tablet TAKE 1 TABLET TWICE A DAY 03/15/17   Lelon Perla, MD  PROAIR HFA 108 [id number] Base) MCG/ACT inhaler Inhale 2 puffs into the lungs every 4 (four) hours as needed for wheezing or shortness of breath. 09/06/17   Tanda Rockers, MD  traMADol (ULTRAM) 50 MG tablet Take 1 tablet (50 mg total) by mouth every 8 (eight) hours as needed (pain). 10/05/17   Biagio Borg, MD  UNABLE TO FIND Med Name:Cpap at night with 2lpm o2    [provider]  vitamin C (ASCORBIC ACID) 500 MG tablet Take 500 mg by mouth daily.     [provider]    Family History Family History  Problem Relation Age of  Onset  . Uterine cancer Mother   . Heart disease Mother   . Breast cancer Sister 76  . Ovarian cancer Sister   . Breast cancer Cousin 44  . Diabetes Brother   . Breast cancer  Maternal Aunt   . Ovarian cancer Sister     Social History Social History   Tobacco Use  . Smoking status: Former Smoker    Packs/day: 1.00    Years: 20.00    Pack years: 20.00    Types: Cigarettes    Last attempt to quit: 09/20/1998    Years since quitting: 19.9  . Smokeless tobacco: Never Used  . Tobacco comment: 1ppd x 20 years  Substance Use Topics  . Alcohol use: Yes    Alcohol/week: 1.0 standard drinks    Types: 1 Glasses of wine per week    Comment: rare  . Drug use: No     Allergies   Ciprofloxacin; Cephalexin; Codeine; Dilaudid [hydromorphone]; Lisinopril; Penicillins; Cleocin [clindamycin hcl]; and Doxycycline   Review of Systems Review of Systems  Constitutional: Negative for chills and fever.  Respiratory: Positive for shortness of breath. Negative for chest tightness.   Cardiovascular: Negative for chest pain.  Gastrointestinal: Positive for abdominal pain. Negative for constipation, diarrhea, nausea and vomiting.  Genitourinary: Positive for frequency and urgency. Negative for difficulty urinating and dysuria.  Skin: Negative for color change and wound.  Allergic/Immunologic: Positive for immunocompromised state.  Neurological: Negative for dizziness and weakness.  Hematological: Does not bruise/bleed easily.  Psychiatric/Behavioral: Negative for confusion.  All other systems reviewed and are negative.    Physical Exam Updated Vital Signs BP 118/80 (BP Location: Right Arm)   Pulse 91   Temp 98.1 F (36.7 C) (Oral)   Resp 16   Ht 5' 6"  (1.676 m)   Wt 118.8 kg   SpO2 99%   BMI 42.29 kg/m   Physical Exam Vitals signs and nursing note reviewed.  Constitutional:      General: She is not in acute distress.    Appearance: She is well-developed. She is obese. She is not  diaphoretic.  HENT:     Head: Normocephalic and atraumatic.  Cardiovascular:     Rate and Rhythm: Normal rate and regular rhythm.  Pulmonary:     Effort: Pulmonary effort is normal. No respiratory distress.     Breath sounds: Normal breath sounds.  Chest:     Chest wall: No tenderness.  Abdominal:     General: Bowel sounds are normal.     Palpations: Abdomen is soft.     Tenderness: There is abdominal tenderness in the epigastric area.     Hernia: A hernia is present. Hernia is present in the ventral area.    Skin:    General: Skin is warm and dry.     Findings: No rash.  Neurological:     Mental Status: She is alert and oriented to person, place, and time.  Psychiatric:        Behavior: Behavior normal.      ED Treatments / Results  Labs (all labs ordered are listed, but only abnormal results are displayed) Labs Reviewed  URINALYSIS, ROUTINE W REFLEX MICROSCOPIC - Abnormal; Notable for the following components:      Result Value   Color, Urine YELLOW (*)    APPearance CLEAR (*)    Hgb urine dipstick LARGE (*)    Ketones, ur TRACE (*)    Protein, ur TRACE (*)    Nitrite POSITIVE (*)    Leukocytes, UA SMALL (*)    All other components within normal limits  URINALYSIS, MICROSCOPIC (REFLEX) - Abnormal; Notable for the following components:   Bacteria, UA MANY (*)    All other components within normal  limits    EKG None  Radiology No results found.  Procedures Procedures (including critical care time)  Medications Ordered in ED Medications - No data to display   Initial Impression / Assessment and Plan / ED Course  I have reviewed the triage vital signs and the nursing notes.  Pertinent labs & imaging results that were available during my care of the patient were reviewed by me and considered in my medical decision making (see chart for details).  Clinical Course as of Sep 11 1547  Sun Sep 10, 5133  2355 72 year old female presents with complaint of  epigastric pain, on exam found to have a hernia which was easily reduced with gentle pressure and completely relieved patient's pain.  Also reported fell odor and dark color to her urine, urinalysis appears contaminated with questionable urinary tract infection, patient will be treated with antibiotics, given Macrobid after checking renal function prior records.  Patient to follow-up with general surgery for evaluation of her hernia, given return to ER precautions.  Patient verbalizes understanding of her discharge instructions and plan.   [LM]    Clinical Course User Index [LM] Tacy Learn, PA-C   Final Clinical Impressions(s) / ED Diagnoses   Final diagnoses:  Ventral hernia without obstruction or gangrene  Urinary tract infection in female    ED Discharge Orders         Ordered    nitrofurantoin, macrocrystal-monohydrate, (MACROBID) 100 MG capsule  2 times daily     09/10/18 1521           Tacy Learn, PA-C 09/10/18 1548    Gareth Morgan, MD 09/12/18 1006

## 2018-09-25 ENCOUNTER — Other Ambulatory Visit: Payer: Self-pay | Admitting: Cardiology

## 2018-09-25 DIAGNOSIS — I2510 Atherosclerotic heart disease of native coronary artery without angina pectoris: Secondary | ICD-10-CM

## 2018-10-03 LAB — CUP PACEART REMOTE DEVICE CHECK
Date Time Interrogation Session: 20180830153151
Implantable Lead Implant Date: 20141212
Implantable Lead Model: 346
Implantable Lead Serial Number: 29409274
Implantable Lead Serial Number: 29535412
MDC IDC LEAD IMPLANT DT: 20141212
MDC IDC LEAD LOCATION: 753859
MDC IDC LEAD LOCATION: 753860
MDC IDC PG IMPLANT DT: 20141212
MDC IDC PG SERIAL: 68181424

## 2018-10-04 LAB — CUP PACEART REMOTE DEVICE CHECK
Date Time Interrogation Session: 20200115084305
Implantable Lead Implant Date: 20141212
Implantable Lead Implant Date: 20141212
Implantable Lead Location: 753859
Implantable Lead Location: 753860
Implantable Lead Model: 346
Implantable Lead Model: 350
Implantable Lead Serial Number: 29535412
Implantable Pulse Generator Implant Date: 20141212
MDC IDC LEAD SERIAL: 29409274
Pulse Gen Serial Number: 68181424

## 2018-10-09 DIAGNOSIS — J449 Chronic obstructive pulmonary disease, unspecified: Secondary | ICD-10-CM | POA: Diagnosis not present

## 2018-10-19 ENCOUNTER — Encounter: Payer: Medicare Other | Admitting: Internal Medicine

## 2018-10-19 ENCOUNTER — Encounter: Payer: Self-pay | Admitting: Internal Medicine

## 2018-10-19 ENCOUNTER — Ambulatory Visit: Payer: Medicare HMO | Admitting: Internal Medicine

## 2018-10-19 VITALS — BP 122/68 | HR 102 | Ht 66.0 in | Wt 282.0 lb

## 2018-10-19 DIAGNOSIS — Z950 Presence of cardiac pacemaker: Secondary | ICD-10-CM | POA: Diagnosis not present

## 2018-10-19 DIAGNOSIS — I5032 Chronic diastolic (congestive) heart failure: Secondary | ICD-10-CM

## 2018-10-19 DIAGNOSIS — I495 Sick sinus syndrome: Secondary | ICD-10-CM

## 2018-10-19 MED ORDER — FUROSEMIDE 40 MG PO TABS: 40.0000 mg | ORAL_TABLET | Freq: Two times a day (BID) | ORAL | 3 refills | Status: DC

## 2018-10-19 NOTE — Patient Instructions (Addendum)
Medication Instructions:  Your physician has recommended you make the following change in your medication:   1.  Lasix 40 mg---Take one tablet by mouth in the morning and one tablet after lunch. 2.  Take a potassium 20 meq tablet with each dose of lasix.  Labwork: None ordered.  Testing/Procedures: None ordered.  Follow-Up: Your physician wants you to follow-up in: one year with Dr. Lovena Le.  You will receive a reminder letter in the mail two months in advance. If you don't receive a letter, please call our office to schedule the follow-up appointment.  Remote monitoring is used to monitor your Pacemaker from home. This monitoring reduces the number of office visits required to check your device to one time per year. It allows [place] to keep an eye on the functioning of your device to ensure it is working properly. You are scheduled for a device check from home on 11/06/2018. You may send your transmission at any time that day. If you have a wireless device, the transmission will be sent automatically. After your physician reviews your transmission, you will receive a postcard with your next transmission date.  Any Other Special Instructions Will Be Listed Below (If Applicable).  If you need a refill on your cardiac medications before your next appointment, please call your pharmacy.

## 2018-10-19 NOTE — Progress Notes (Signed)
HPI [name] returns today for followup of CAD, HTN, remote breast CA, diastolic heart failure and sinus node dysfunction, s/p PPM insertion. She has class 2-3 diastolic heart failure symptoms and she notes that she has been under increased stress as her son tried to committ suicide. She has not had anginal symptoms. She admits to some sodium indiscretion. Her weight is back up and she is not sleeping well. Allergies  Allergen Reactions  . Ciprofloxacin Other (See Comments)    foliculitis  . Cephalexin Other (See Comments)    Exfoliative dermatitis reaction  . Codeine     REACTION: unknown - pt. states unaware of having reaction to codeine  . Dilaudid [Hydromorphone] Other (See Comments)    Other Reaction: Oversedation  . Lisinopril     REACTION: cough  . Penicillins     rash  . Cleocin [Clindamycin Hcl] Rash    rash  . Doxycycline Rash     Current Outpatient Medications  Medication Sig Dispense Refill  . acetaminophen (TYLENOL) 325 MG tablet Take as needed per bottle    . allopurinol (ZYLOPRIM) 300 MG tablet TAKE 1 TABLET DAILY 90 tablet 1  . amitriptyline (ELAVIL) 100 MG tablet TAKE 1 TABLET BY MOUTH AT BEDTIME 30 tablet 2  . atorvastatin (LIPITOR) 40 MG tablet TAKE 1 TABLET DAILY 90 tablet 4  . budesonide-formoterol (SYMBICORT) 160-4.5 MCG/ACT inhaler Inhale 2 puffs into the lungs 2 (two) times daily. 1 Inhaler 0  . Cholecalciferol (VITAMIN D) 2000 UNITS tablet Take 2,000 Units by mouth at bedtime.    . clonazePAM (KLONOPIN) 0.5 MG tablet TAKE 1 TABLET BY MOUTH TWICE A DAY IF NEEDED FOR ANXIETY 60 tablet 2  . dextromethorphan-guaiFENesin (MUCINEX DM) 30-600 MG 12hr tablet Take 1 tablet by mouth 2 (two) times daily as needed for cough.    . DULoxetine (CYMBALTA) 30 MG capsule Take 2 capsules (60 mg total) by mouth at bedtime. 2 at bedtime 180 capsule 3  . ELIQUIS 5 MG TABS tablet TAKE 1 TABLET TWICE A DAY 180 tablet 0  . famotidine (PEPCID) 20 MG tablet Take 20 mg by  mouth at bedtime.    . hydrOXYzine (ATARAX/VISTARIL) 25 MG tablet Take 1 tablet (25 mg total) by mouth 3 (three) times daily as needed. 60 tablet 1  . levothyroxine (SYNTHROID, LEVOTHROID) 200 MCG tablet TAKE 1 TABLET DAILY 90 tablet 3  . metoprolol tartrate (LOPRESSOR) 25 MG tablet TAKE 1 TABLET TWICE A DAY 180 tablet 1  . omeprazole (PRILOSEC) 40 MG capsule TAKE 1 CAPSULE DAILY 90 capsule 0  . OXYGEN 2lpm with CPAP at night and with exertion    . potassium chloride SA (K-DUR,KLOR-CON) 20 MEQ tablet TAKE 1 TABLET TWICE A DAY 180 tablet 3  . PROAIR HFA 108 (90 Base) MCG/ACT inhaler Inhale 2 puffs into the lungs every 4 (four) hours as needed for wheezing or shortness of breath. 1 Inhaler 4  . traMADol (ULTRAM) 50 MG tablet Take 1 tablet (50 mg total) by mouth every 8 (eight) hours as needed (pain). 90 tablet 5  . UNABLE TO FIND Med Name:Cpap at night with 2lpm o2    . vitamin C (ASCORBIC ACID) 500 MG tablet Take 500 mg by mouth daily.     . furosemide (LASIX) 40 MG tablet Take 1 tablet (40 mg total) by mouth 2 (two) times daily. Take one tablet in the morning and one tablet after lunch 180 tablet 3   No current facility-administered medications  for this visit.      Past Medical History:  Diagnosis Date  . Anemia   . Asthma 06/13/2011   hx of  . Atrial fibrillation ([place])   . Atrial flutter ([place])   . Breast cancer ([place]) dx'd 2005   No BP check or stick in Left arm  . CAD (coronary artery disease)    a. Nonobstructive CAD 2007 (EF 75%.  RCA  proximal 75% tubular, 25% prox LAD, 25% d1, 50% D2) [facility]. b. NSTEMI 01/2010 in setting of respiratory failure, medical approach (not a good candidate for noninvasive eval)  . Cellulitis    hx of cellulitis in left leg  . Cervical radiculopathy 06/13/2011  . COPD (chronic obstructive pulmonary disease) ([place])    on O2  . Depression 06/13/2011  . Diverticulosis   . Esophageal dysmotility   . Gout 06/13/2011  . History of Clostridium difficile early  dec 2015   no current diarrhea  . History of home oxygen therapy    2 liters per nasal cannula all the time  . Hx of dysfunctional uterine bleeding    last bleeding jan 2016, worked up for with d and c x 2 no cause found  . Hyperlipidemia   . Hypertension   . Hypothyroidism   . Impaired glucose tolerance 11/01/2012  . Neuropathy 06/13/2011  . Obesity hypoventilation syndrome ([place]) 06/13/2011  . OSA on CPAP   . Personal history of chemotherapy   . Personal history of radiation therapy   . PFO (patent foramen ovale)   . Risk for falls   . Syncope and collapse    Bradycardia with pauses. S/P pacemaker  . Tubular adenoma of colon     ROS:   All systems reviewed and negative except as noted in the HPI.   Past Surgical History:  Procedure Laterality Date  . BREAST BIOPSY    . BREAST LUMPECTOMY Left 05/2004  . CARDIOVERSION N/A 03/15/2013   Procedure: CARDIOVERSION;  Surgeon: Thayer Headings, MD;  Location: [facility] ENDOSCOPY;  Service: Cardiovascular;  Laterality: N/A;  . CERVICAL BIOPSY  June 2013   at [facility] for Heavy  Bleeding  . [place] SURGERY  2004 or 2005   have cadaver bones,, screws, and plates c 5 to c 6  . COLONOSCOPY    . COLONOSCOPY WITH PROPOFOL N/A 10/22/2014   Procedure: COLONOSCOPY WITH PROPOFOL;  Surgeon: Jerene Bears, MD;  Location: [facility] ENDOSCOPY;  Service: Gastroenterology;  Laterality: N/A;  . DILATION AND CURETTAGE OF UTERUS    . LAPAROSCOPIC APPENDECTOMY  03/29/2012   Procedure: APPENDECTOMY LAPAROSCOPIC;  Surgeon: Adin Hector, MD;  Location: [facility] ORS;  Service: General;  Laterality: N/A;  . LOOP RECORDER EXPLANT  08/31/2013   Procedure: LOOP RECORDER EXPLANT;  Surgeon: Coralyn Mark, MD;  Location[facility] CATH LAB;  Service: Cardiovascular;;  . LOOP RECORDER IMPLANT  08-28-2013; 08-31-2013   MDT LinQ implanted by Dr Rayann Heman for syncope; explanted 08-31-2013 after sinus pauses identified  . LOOP RECORDER IMPLANT N/A 08/28/2013   Procedure: LOOP RECORDER IMPLANT;   Surgeon: Coralyn Mark, MD;  Location: [place] CATH LAB;  Service: Cardiovascular;  Laterality: N/A;  . OVARIAN CYST REMOVAL    . PACEMAKER INSERTION  08-31-2013   Biotronik Evera dual chamber pacemaker placed for neurocardiogenic syncope and sinus pauses by Dr Rayann Heman  . PERMANENT PACEMAKER INSERTION N/A 08/31/2013   Procedure: PERMANENT PACEMAKER INSERTION;  Surgeon: Coralyn Mark, MD;  Location: [place] CATH LAB;  Service: Cardiovascular;  Laterality: N/A;  . TEE WITHOUT CARDIOVERSION N/A 03/15/2013   Procedure: TRANSESOPHAGEAL ECHOCARDIOGRAM (TEE);  Surgeon: Thayer Headings, MD;  Location: [facility] ENDOSCOPY;  Service: Cardiovascular;  Laterality: N/A;     Family History  Problem Relation Age of Onset  . Uterine cancer Mother   . Heart disease Mother   . Breast cancer Sister 22  . Ovarian cancer Sister   . Breast cancer Cousin 8  . Diabetes Brother   . Breast cancer Maternal Aunt   . Ovarian cancer Sister      Social History   Socioeconomic History  . Marital status: Divorced    Spouse name: Not on file  . Number of children: 2  . Years of education: Not on file  . Highest education level: Not on file  Occupational History  . Occupation: Retired  Scientific laboratory technician  . Financial resource strain: Not on file  . Food insecurity:    Worry: Not on file    Inability: Not on file  . Transportation needs:    Medical: Not on file    Non-medical: Not on file  Tobacco Use  . Smoking status: Former Smoker    Packs/day: 1.00    Years: 20.00    Pack years: 20.00    Types: Cigarettes    Last attempt to quit: 09/20/1998    Years since quitting: 20.0  . Smokeless tobacco: Never Used  . Tobacco comment: 1ppd x 20 years  Substance and Sexual Activity  . Alcohol use: Yes    Alcohol/week: 1.0 standard drinks    Types: 1 Glasses of wine per week    Comment: rare  . Drug use: No  . Sexual activity: Not Currently    Birth control/protection: Post-menopausal  Lifestyle  . Physical activity:    Days  per week: Not on file    Minutes per session: Not on file  . Stress: Not on file  Relationships  . Social connections:    Talks on phone: Not on file    Gets together: Not on file    Attends religious service: Not on file    Active member of club or organization: Not on file    Attends meetings of clubs or organizations: Not on file    Relationship status: Not on file  . Intimate partner violence:    Fear of current or ex partner: Not on file    Emotionally abused: Not on file    Physically abused: Not on file    Forced sexual activity: Not on file  Other Topics Concern  . Not on file  Social History Narrative   Lives alone in an apartment on the first floor.   Has 2 children.  Retired Advertising copywriter.  Education: some college.      BP 122/68   Pulse (!) 102   Ht 5' 6"  (1.676 m)   Wt 282 lb (127.9 kg)   SpO2 98%   BMI 45.52 kg/m   Physical Exam:  obese appearing 73 yo woman, NAD HEENT: Unremarkable Neck:  6 cm JVD, no thyromegally Lymphatics:  No adenopathy Back:  No CVA tenderness Lungs:  Clear with no wheezes HEART:  Regular rate rhythm, no murmurs, no rubs, no clicks Abd:  soft, positive bowel sounds, no organomegally, no rebound, no guarding Ext:  2 plus pulses, no edema, no cyanosis, no clubbing Skin:  No rashes no nodules Neuro:  CN II through XII intact, motor grossly intact  EKG - sinus tachy  DEVICE  Normal device function.  See PaceArt for details.   Assess/Plan: 1. Acute on chronic diastolic CHF  - I have encouraged the patient to take her lasix twice daily. She had been taking the second dose at night and then was up all night going to the bathroom. I instructed her to take her lasix in the morning and the early afternoon. She is encouraged to reduce her salt intake. 2. HTN - her blood pressure is well controlled today. She is encouraged to lose weight. She will continue her beta blocker. 3. PPM - her Biotronik DDD PM is working normally. We will  recheck in several months. 4. Obesity - she has lost weight but also gained it back. I encouraged her to continue to try and lose weight.   Mikle Bosworth.D.

## 2018-11-01 ENCOUNTER — Other Ambulatory Visit: Payer: Self-pay | Admitting: Internal Medicine

## 2018-11-03 DIAGNOSIS — I10 Essential (primary) hypertension: Secondary | ICD-10-CM | POA: Diagnosis not present

## 2018-11-03 DIAGNOSIS — G4733 Obstructive sleep apnea (adult) (pediatric): Secondary | ICD-10-CM | POA: Diagnosis not present

## 2018-11-06 ENCOUNTER — Ambulatory Visit (INDEPENDENT_AMBULATORY_CARE_PROVIDER_SITE_OTHER): Payer: Medicare HMO

## 2018-11-06 DIAGNOSIS — I495 Sick sinus syndrome: Secondary | ICD-10-CM

## 2018-11-06 DIAGNOSIS — R55 Syncope and collapse: Secondary | ICD-10-CM

## 2018-11-06 LAB — CUP PACEART REMOTE DEVICE CHECK
Implantable Lead Implant Date: 20141212
Implantable Lead Location: 753859
Implantable Lead Location: 753860
Implantable Lead Model: 350
Implantable Lead Serial Number: 29409274
Implantable Pulse Generator Implant Date: 20141212
MDC IDC LEAD IMPLANT DT: 20141212
MDC IDC LEAD SERIAL: 29535412
MDC IDC SESS DTM: 20200217143612
Pulse Gen Serial Number: 68181424

## 2018-11-09 ENCOUNTER — Other Ambulatory Visit: Payer: Self-pay | Admitting: Internal Medicine

## 2018-11-09 DIAGNOSIS — J449 Chronic obstructive pulmonary disease, unspecified: Secondary | ICD-10-CM | POA: Diagnosis not present

## 2018-11-10 NOTE — Telephone Encounter (Signed)
Done erx 

## 2018-11-15 NOTE — Progress Notes (Signed)
Remote pacemaker transmission.   

## 2018-11-16 NOTE — Progress Notes (Signed)
HPI: FU atrial flutter. Also with hx of CAD, HTN, HL, OSA, hypothyroidism, COPD on O2, L breast CA. LHC at [facility] 10/2005: pRCA 75%, LM <25%, pLAD 25%, D1 25%, D2 50%. Carotid Dopplers June 2013 showed no significant stenosis. She suffered a NSTEMI in 01/2010 in the setting of SIRS from leg cellulitis. This was felt to be demand ischemia. Med Rx was pursued (felt to be a poor candidate for noninvasive imaging). Patient had atrial flutter with RVR 6/14. She was felt to be a poor candidate for atrial flutter ablation. TEE guided cardioversion was performed. She had restoration of NSR. TEE 03/15/13: EF 55-60%, no LA clot, positive PFO, mild to moderate TR. Patient admitted in December of 2014 following a syncopal episode. She had an implantable loop placed which demonstrated symptomatic bradycardia with pauses and ultimately had a pacemaker placed.Also with diastolic CHF.Echocardiogram August 2019 showed ejection fraction 55 to 29%, mild diastolic dysfunction, mild to moderate aortic insufficiency, mild to moderate mitral regurgitation, mild to moderate tricuspid regurgitation and mild left atrial enlargement.  Since last seen,she has lost a total of 100 pounds over time.  Her blood pressure is now decreasing and she has held her Lopressor.  She feels her heart rate elevated at times.  She notes worsening bilateral lower extremity edema.  Current Outpatient Medications  Medication Sig Dispense Refill  . acetaminophen (TYLENOL) 325 MG tablet Take as needed per bottle    . allopurinol (ZYLOPRIM) 300 MG tablet TAKE 1 TABLET DAILY 90 tablet 1  . amitriptyline (ELAVIL) 100 MG tablet TAKE 1 TABLET BY MOUTH AT BEDTIME 30 tablet 2  . atorvastatin (LIPITOR) 40 MG tablet TAKE 1 TABLET DAILY 90 tablet 4  . Cholecalciferol (VITAMIN D) 2000 UNITS tablet Take 2,000 Units by mouth at bedtime.    . clonazePAM (KLONOPIN) 0.5 MG tablet TAKE 1 TABLET BY MOUTH TWICE DAILY AS NEEDED FOR ANXIETY 60 tablet 2  .  dextromethorphan-guaiFENesin (MUCINEX DM) 30-600 MG 12hr tablet Take 1 tablet by mouth 2 (two) times daily as needed for cough.    . DULoxetine (CYMBALTA) 30 MG capsule TAKE 2 CAPSULES AT BEDTIME 180 capsule 1  . ELIQUIS 5 MG TABS tablet TAKE 1 TABLET TWICE A DAY 180 tablet 0  . famotidine (PEPCID) 20 MG tablet Take 20 mg by mouth every morning.     . furosemide (LASIX) 40 MG tablet Take 1 tablet (40 mg total) by mouth 2 (two) times daily. Take one tablet in the morning and one tablet after lunch 180 tablet 3  . levothyroxine (SYNTHROID, LEVOTHROID) 200 MCG tablet TAKE 1 TABLET DAILY 90 tablet 3  . omeprazole (PRILOSEC) 40 MG capsule TAKE 1 CAPSULE DAILY 90 capsule 0  . OXYGEN 2lpm with CPAP at night and with exertion    . potassium chloride SA (K-DUR,KLOR-CON) 20 MEQ tablet TAKE 1 TABLET TWICE A DAY 180 tablet 3  . PROAIR HFA 108 (90 Base) MCG/ACT inhaler Inhale 2 puffs into the lungs every 4 (four) hours as needed for wheezing or shortness of breath. 1 Inhaler 4  . SYMBICORT 160-4.5 MCG/ACT inhaler INHALE 2 PUFFS BY MOUTH TWICE A DAY 10.2 g 1  . traMADol (ULTRAM) 50 MG tablet TAKE 1 TABLET BY MOUTH EVERY 8 HOURS IF NEEDED FOR PAIN 90 tablet 5  . UNABLE TO FIND Med Name:Cpap at night with 2lpm o2    . vitamin C (ASCORBIC ACID) 500 MG tablet Take 500 mg by mouth daily.     [name]  metoprolol tartrate (LOPRESSOR) 25 MG tablet TAKE 1 TABLET TWICE A DAY (Patient not taking: Reported on 11/21/2018) 180 tablet 1   No current facility-administered medications for this visit.      Past Medical History:  Diagnosis Date  . Anemia   . Asthma 06/13/2011   hx of  . Atrial fibrillation ([place])   . Atrial flutter ([place])   . Breast cancer ([place]) dx'd 2005   No BP check or stick in Left arm  . CAD (coronary artery disease)    a. Nonobstructive CAD 2007 (EF 75%.  RCA  proximal 75% tubular, 25% prox LAD, 25% d1, 50% D2) at [facility]. b. NSTEMI 01/2010 in setting of respiratory failure, medical approach (not a good  candidate for noninvasive eval)  . Cellulitis    hx of cellulitis in left leg  . Cervical radiculopathy 06/13/2011  . COPD (chronic obstructive pulmonary disease) ([place])    on O2  . Depression 06/13/2011  . Diverticulosis   . Esophageal dysmotility   . Gout 06/13/2011  . History of Clostridium difficile early dec 2015   no current diarrhea  . History of home oxygen therapy    2 liters per nasal cannula all the time  . Hx of dysfunctional uterine bleeding    last bleeding jan 2016, worked up for with d and c x 2 no cause found  . Hyperlipidemia   . Hypertension   . Hypothyroidism   . Impaired glucose tolerance 11/01/2012  . Neuropathy 06/13/2011  . Obesity hypoventilation syndrome ([place]) 06/13/2011  . OSA on CPAP   . Personal history of chemotherapy   . Personal history of radiation therapy   . PFO (patent foramen ovale)   . Risk for falls   . Syncope and collapse    Bradycardia with pauses. S/P pacemaker  . Tubular adenoma of colon     Past Surgical History:  Procedure Laterality Date  . BREAST BIOPSY    . BREAST LUMPECTOMY Left 05/2004  . CARDIOVERSION N/A 03/15/2013   Procedure: CARDIOVERSION;  Surgeon: Thayer Headings, MD;  Location: [facility] ENDOSCOPY;  Service: Cardiovascular;  Laterality: N/A;  . CERVICAL BIOPSY  June 2013   at [facility] for Heavy  Bleeding  . [place] SURGERY  2004 or 2005   have cadaver bones,, screws, and plates c 5 to c 6  . COLONOSCOPY    . COLONOSCOPY WITH PROPOFOL N/A 10/22/2014   Procedure: COLONOSCOPY WITH PROPOFOL;  Surgeon: Jerene Bears, MD;  Location[facility] ENDOSCOPY;  Service: Gastroenterology;  Laterality: N/A;  . DILATION AND CURETTAGE OF UTERUS    . LAPAROSCOPIC APPENDECTOMY  03/29/2012   Procedure: APPENDECTOMY LAPAROSCOPIC;  Surgeon: Adin Hector, MD;  Location: [facility] ORS;  Service: General;  Laterality: N/A;  . LOOP RECORDER EXPLANT  08/31/2013   Procedure: LOOP RECORDER EXPLANT;  Surgeon: Coralyn Mark, MD;  Location: [facility] CATH LAB;  Service:  Cardiovascular;;  . LOOP RECORDER IMPLANT  08-28-2013; 08-31-2013   MDT LinQ implanted by Dr Rayann Heman for syncope; explanted 08-31-2013 after sinus pauses identified  . LOOP RECORDER IMPLANT N/A 08/28/2013   Procedure: LOOP RECORDER IMPLANT;  Surgeon: Coralyn Mark, MD;  Location: [place] CATH LAB;  Service: Cardiovascular;  Laterality: N/A;  . OVARIAN CYST REMOVAL    . PACEMAKER INSERTION  08-31-2013   Biotronik Evera dual chamber pacemaker placed for neurocardiogenic syncope and sinus pauses by Dr Rayann Heman  . PERMANENT PACEMAKER INSERTION N/A 08/31/2013   Procedure: PERMANENT PACEMAKER INSERTION;  Surgeon: Jeneen Rinks  D Allred, MD;  Location: [place] CATH LAB;  Service: Cardiovascular;  Laterality: N/A;  . TEE WITHOUT CARDIOVERSION N/A 03/15/2013   Procedure: TRANSESOPHAGEAL ECHOCARDIOGRAM (TEE);  Surgeon: Thayer Headings, MD;  Location: [facility] ENDOSCOPY;  Service: Cardiovascular;  Laterality: N/A;    Social History   Socioeconomic History  . Marital status: Divorced    Spouse name: Not on file  . Number of children: 2  . Years of education: Not on file  . Highest education level: Not on file  Occupational History  . Occupation: Retired  Scientific laboratory technician  . Financial resource strain: Not on file  . Food insecurity:    Worry: Not on file    Inability: Not on file  . Transportation needs:    Medical: Not on file    Non-medical: Not on file  Tobacco Use  . Smoking status: Former Smoker    Packs/day: 1.00    Years: 20.00    Pack years: 20.00    Types: Cigarettes    Last attempt to quit: 09/20/1998    Years since quitting: 20.1  . Smokeless tobacco: Never Used  . Tobacco comment: 1ppd x 20 years  Substance and Sexual Activity  . Alcohol use: Yes    Alcohol/week: 1.0 standard drinks    Types: 1 Glasses of wine per week    Comment: rare  . Drug use: No  . Sexual activity: Not Currently    Birth control/protection: Post-menopausal  Lifestyle  . Physical activity:    Days per week: Not on file     Minutes per session: Not on file  . Stress: Not on file  Relationships  . Social connections:    Talks on phone: Not on file    Gets together: Not on file    Attends religious service: Not on file    Active member of club or organization: Not on file    Attends meetings of clubs or organizations: Not on file    Relationship status: Not on file  . Intimate partner violence:    Fear of current or ex partner: Not on file    Emotionally abused: Not on file    Physically abused: Not on file    Forced sexual activity: Not on file  Other Topics Concern  . Not on file  Social History Narrative   Lives alone in an apartment on the first floor.   Has 2 children.  Retired Advertising copywriter.  Education: some college.     Family History  Problem Relation Age of Onset  . Uterine cancer Mother   . Heart disease Mother   . Breast cancer Sister 27  . Ovarian cancer Sister   . Breast cancer Cousin 67  . Diabetes Brother   . Breast cancer Maternal Aunt   . Ovarian cancer Sister     ROS: no fevers or chills, productive cough, hemoptysis, dysphasia, odynophagia, melena, hematochezia, dysuria, hematuria, rash, seizure activity, orthopnea, PND, pedal edema, claudication. Remaining systems are negative.  Physical Exam: Well-developed obese in no acute distress.  Skin is warm and dry.  HEENT is normal.  Neck is supple.  Chest is clear to auscultation with normal expansion.  Cardiovascular exam is regular rate and rhythm.  2/6 systolic murmur left sternal border. Abdominal exam nontender or distended. No masses palpated. Extremities show 1+ edema. neuro grossly intact  ECG-normal sinus rhythm at a rate of 99, RV conduction delay.  Personally reviewed  A/P  1 coronary artery disease-patient denies chest pain.  Continue medical therapy with statin.  She is not on aspirin given need for apixaban.  2 history of atrial flutter-patient remains in sinus rhythm today.  Continue apixaban.  Since  losing 100 pounds her blood pressure is running lower.  Her systolic is in the 37O and low 100s and she has some weakness with this.  We have discontinued metoprolol for now and will follow blood pressure.  3 chronic diastolic congestive heart failure-she is volume overloaded today.  Increase Lasix to 60 mg twice daily.  Check potassium and renal function in 1 week.  Continue fluid restriction and low-sodium diet.  4 hypertension-patient's blood pressure is running lower since weight loss.  She is having symptomatic hypotension.  Discontinue metoprolol and follow.  5 hyperlipidemia-continue statin.  6 aortic insufficiency-mild to moderate on most recent echocardiogram.  Also with mild to moderate mitral and tricuspid regurgitation.  She will need repeat studies in the future.  7 prior pacemaker-followed by electrophysiology.  8 morbid obesity-needs weight loss.  Kirk Ruths, MD

## 2018-11-18 ENCOUNTER — Other Ambulatory Visit: Payer: Self-pay | Admitting: Internal Medicine

## 2018-11-21 ENCOUNTER — Other Ambulatory Visit: Payer: Self-pay | Admitting: *Deleted

## 2018-11-21 ENCOUNTER — Ambulatory Visit: Payer: Medicare HMO | Admitting: Cardiology

## 2018-11-21 ENCOUNTER — Encounter: Payer: Self-pay | Admitting: Cardiology

## 2018-11-21 VITALS — BP 104/74 | HR 100 | Ht 65.5 in | Wt 268.0 lb

## 2018-11-21 DIAGNOSIS — I10 Essential (primary) hypertension: Secondary | ICD-10-CM | POA: Diagnosis not present

## 2018-11-21 DIAGNOSIS — I5032 Chronic diastolic (congestive) heart failure: Secondary | ICD-10-CM

## 2018-11-21 DIAGNOSIS — I480 Paroxysmal atrial fibrillation: Secondary | ICD-10-CM | POA: Diagnosis not present

## 2018-11-21 DIAGNOSIS — I2510 Atherosclerotic heart disease of native coronary artery without angina pectoris: Secondary | ICD-10-CM | POA: Diagnosis not present

## 2018-11-21 MED ORDER — FUROSEMIDE 40 MG PO TABS: 60.0000 mg | ORAL_TABLET | Freq: Two times a day (BID) | ORAL | 3 refills | Status: DC

## 2018-11-21 MED ORDER — APIXABAN 5 MG PO TABS: 5.0000 mg | ORAL_TABLET | Freq: Two times a day (BID) | ORAL | 0 refills | Status: DC

## 2018-11-21 NOTE — Patient Instructions (Signed)
Medication Instructions:  INCREASE FUROSEMIDE TO 60 MG TWICE DAILY= 1 AND 1/2 TABLETS TWICE DAILY  STOP METOPROLOL If you need a refill on your cardiac medications before your next appointment, please call your pharmacy.   Lab work: Your physician recommends that you return for lab work in: [place] If you have labs (blood work) drawn today and your tests are completely normal, you will receive your results only by: [name] MyChart Message (if you have MyChart) OR . A paper copy in the mail If you have any lab test that is abnormal or we need to change your treatment, we will call you to review the results.  Follow-Up: At [facility], you and your health needs are our priority.  As part of our continuing mission to provide you with exceptional heart care, we have created designated Provider Care Teams.  These Care Teams include your primary Cardiologist (physician) and Advanced Practice Providers (APPs -  Physician Assistants and Nurse Practitioners) who all work together to provide you with the care you need, when you need it. Your physician recommends that you schedule a follow-up appointment in: [place] physician recommends that you schedule a follow-up appointment in: [place]

## 2018-11-29 ENCOUNTER — Telehealth: Payer: Self-pay | Admitting: Cardiology

## 2018-11-29 MED ORDER — FUROSEMIDE 40 MG PO TABS: 60.0000 mg | ORAL_TABLET | Freq: Two times a day (BID) | ORAL | 0 refills | Status: DC

## 2018-11-29 NOTE — Telephone Encounter (Signed)
Returned call to patient left message on personal voice mail Furosemide refill sent to pharmacy.

## 2018-11-29 NOTE — Telephone Encounter (Signed)
°*  STAT* If patient is at the pharmacy, call can be transferred to refill team.   1. Which medications need to be refilled? (please list name of each medication and dose if known) new prescription for Furosemide, need this until her mail order comes in  2. Which pharmacy/location (including street and city if local pharmacy) is medication to be sent to? [facility] [phone]  3. Do they need a 30 day or 90 day supply?30 until mail order  Furosemide- need this until her mail order gets here

## 2018-12-07 ENCOUNTER — Encounter: Payer: Self-pay | Admitting: Internal Medicine

## 2018-12-08 DIAGNOSIS — J449 Chronic obstructive pulmonary disease, unspecified: Secondary | ICD-10-CM | POA: Diagnosis not present

## 2018-12-13 ENCOUNTER — Ambulatory Visit: Payer: Self-pay | Admitting: *Deleted

## 2018-12-13 NOTE — Telephone Encounter (Signed)
Fine to write note stating what she want [place] to say re her medical conditions plus severe copd complicated by chronic respiratory failure.

## 2018-12-13 NOTE — Telephone Encounter (Signed)
MW please advise.  Thanks.  

## 2018-12-13 NOTE — Telephone Encounter (Signed)
Patient's daughter called asking how she can obtain a competency evaluation for the patient. Lynett Fish, is not on her DPR. Informed her that an admission to the hospital via ED would be one way competency could be evaluated.

## 2018-12-14 ENCOUNTER — Encounter: Payer: Self-pay | Admitting: General Surgery

## 2018-12-26 ENCOUNTER — Other Ambulatory Visit: Payer: Self-pay | Admitting: Cardiology

## 2018-12-26 NOTE — Telephone Encounter (Signed)
Furosemide refilled. 

## 2019-01-01 ENCOUNTER — Telehealth: Payer: Self-pay

## 2019-01-01 ENCOUNTER — Other Ambulatory Visit: Payer: Self-pay | Admitting: *Deleted

## 2019-01-01 DIAGNOSIS — I5032 Chronic diastolic (congestive) heart failure: Secondary | ICD-10-CM

## 2019-01-01 NOTE — Telephone Encounter (Signed)
Left message for patient to contact office to discuss appointment options. As well as sent a mychart message. Will wait to hear back from patient.

## 2019-01-01 NOTE — Telephone Encounter (Signed)
Virtual Visit Pre-Appointment Phone Call  Steps For Call:  1. Confirm consent - "In the setting of the current Covid19 crisis, you are scheduled for a PHONE visit with your provider on 01/03/2019 at 10:00AM.  Just as we do with many in-office visits, in order for you to participate in this visit, we must obtain consent.  If you'd like, I can send this to your mychart (if signed up) or email for you to review.  Otherwise, I can obtain your verbal consent now.  All virtual visits are billed to your insurance company just like a normal visit would be.  By agreeing to a virtual visit, we'd like you to understand that the technology does not allow for your provider to perform an examination, and thus may limit your provider's ability to fully assess your condition.  Finally, though the technology is pretty good, we cannot assure that it will always work on either your or our end, and in the setting of a video visit, we may have to convert it to a phone-only visit.  In either situation, we cannot ensure that we have a secure connection.  Are you willing to proceed?"  2. Give patient instructions for WebEx download to smartphone as below if video visit  3. Advise patient to be prepared with any vital sign or heart rhythm information, their current medicines, and a piece of paper and pen handy for any instructions they may receive the day of their visit  4. Inform patient they will receive a phone call 15 minutes prior to their appointment time (may be from unknown caller ID) so they should be prepared to answer  5. Confirm that appointment type is correct in Epic appointment notes (video vs telephone)    TELEPHONE CALL NOTE  [name] [name] has been deemed a candidate for a follow-up tele-health visit to limit community exposure during the Covid-19 pandemic. I spoke with the patient via phone to ensure availability of phone/video source, confirm preferred email & phone [name], and discuss instructions  and expectations.  I reminded [name] [name] to be prepared with any vital sign and/or heart rhythm information that could potentially be obtained via home monitoring, at the time of her visit. I reminded [name] [name] to expect a phone call at the time of her visit if her visit.  Did the patient verbally acknowledge consent to treatment? [name] [name], CMA 01/01/2019 4:47 PM   DOWNLOADING THE [place], go to [employer] and type in WebEx in the search bar. [place] [employer], the blue/green circle. The app is free but as with any other app downloads, their phone may require them to verify saved payment information or Apple password. The patient does NOT have to create an account.  - If Android, ask patient to go to [employer] and type in WebEx in the search bar. [place] [employer], the blue/green circle. The app is free but as with any other app downloads, their phone may require them to verify saved payment information or Android password. The patient does NOT have to create an account.   CONSENT FOR TELE-HEALTH VISIT - PLEASE REVIEW  I hereby voluntarily request, consent and authorize [facility] and its employed or contracted physicians, physician assistants, nurse practitioners or other licensed health care professionals (the Practitioner), to provide me with telemedicine health care services (the "Services") as deemed necessary by the treating Practitioner. I acknowledge  and consent to receive the Services by the Practitioner via telemedicine. I understand that the telemedicine visit will involve communicating with the Practitioner through live audiovisual communication technology and the disclosure of certain medical information by electronic transmission. I acknowledge that I have been given the opportunity to request an in-person assessment or other available alternative prior to the telemedicine visit and am voluntarily  participating in the telemedicine visit.  I understand that I have the right to withhold or withdraw my consent to the use of telemedicine in the course of my care at any time, without affecting my right to future care or treatment, and that the Practitioner or I may terminate the telemedicine visit at any time. I understand that I have the right to inspect all information obtained and/or recorded in the course of the telemedicine visit and may receive copies of available information for a reasonable fee.  I understand that some of the potential risks of receiving the Services via telemedicine include:  [name] Delay or interruption in medical evaluation due to technological equipment failure or disruption; . Information transmitted may not be sufficient (e.g. poor resolution of images) to allow for appropriate medical decision making by the Practitioner; and/or  . In rare instances, security protocols could fail, causing a breach of personal health information.  Furthermore, I acknowledge that it is my responsibility to provide information about my medical history, conditions and care that is complete and accurate to the best of my ability. I acknowledge that Practitioner's advice, recommendations, and/or decision may be based on factors not within their control, such as incomplete or inaccurate data provided by me or distortions of diagnostic images or specimens that may result from electronic transmissions. I understand that the practice of medicine is not an exact science and that Practitioner makes no warranties or guarantees regarding treatment outcomes. I acknowledge that I will receive a copy of this consent concurrently upon execution via email to the email address I last provided but may also request a printed copy by calling the office of [place].    I understand that my insurance will be billed for this visit.   I have read or had this consent read to me. . I understand the contents of this  consent, which adequately explains the benefits and risks of the Services being provided via telemedicine.  . I have been provided ample opportunity to ask questions regarding this consent and the Services and have had my questions answered to my satisfaction. . I give my informed consent for the services to be provided through the use of telemedicine in my medical care  By participating in this telemedicine visit I agree to the above.

## 2019-01-01 NOTE — Telephone Encounter (Signed)
   Cardiac Questionnaire:    Since your last visit or hospitalization:    1. Have you been having new or worsening chest pain? NO   2. Have you been having new or worsening shortness of breath? NO 3. Have you been having new or worsening leg swelling, wt gain, or increase in abdominal girth (pants fitting more tightly)? HAS SOME SWELLING   4. Have you had any passing out spells? NO    *A YES to any of these questions would result in the appointment being kept. *If all the answers to these questions are NO, we should indicate that given the current situation regarding the worldwide coronarvirus pandemic, at the recommendation of the CDC, we are looking to limit gatherings in our waiting area, and thus will reschedule their appointment beyond four weeks from today.   _____________   HQPRF-16 Pre-Screening Questions:  . Do you currently have a fever? NO (yes = cancel and refer to pcp for e-visit) . Have you recently travelled on a cruise, internationally, or to [place], [place], [place], [place], [place], or [place], [place] [employer]) ? NO (yes = cancel, stay home, monitor symptoms, and contact pcp or initiate e-visit if symptoms develop) . Have you been in contact with someone that is currently pending confirmation of Covid19 testing or has been confirmed to have the [place] virus?  NO (yes = cancel, stay home, away from tested individual, monitor symptoms, and contact pcp or initiate e-visit if symptoms develop) . Are you currently experiencing fatigue or cough? NO (yes = pt should be prepared to have a mask placed at the time of their visit).       PATIENT STATES SHE HAS A IPHONE 11 BUT DOESN'T WANT TO DO A VIDEO VISIT IF HER HAIR ISNT COMBED.

## 2019-01-02 ENCOUNTER — Telehealth: Payer: Self-pay | Admitting: Cardiology

## 2019-01-02 ENCOUNTER — Ambulatory Visit: Payer: Medicare Other | Admitting: Internal Medicine

## 2019-01-02 NOTE — Telephone Encounter (Signed)
Mychart, smartphone, pre reg complete 01/02/19 AF

## 2019-01-03 ENCOUNTER — Telehealth (INDEPENDENT_AMBULATORY_CARE_PROVIDER_SITE_OTHER): Payer: Medicare HMO | Admitting: Cardiology

## 2019-01-03 ENCOUNTER — Telehealth: Payer: Self-pay

## 2019-01-03 ENCOUNTER — Ambulatory Visit: Payer: Medicare HMO | Admitting: Cardiology

## 2019-01-03 ENCOUNTER — Encounter: Payer: Self-pay | Admitting: Cardiology

## 2019-01-03 VITALS — BP 120/61 | HR 82 | Temp 94.6°F | Ht 65.5 in | Wt 260.0 lb

## 2019-01-03 DIAGNOSIS — J449 Chronic obstructive pulmonary disease, unspecified: Secondary | ICD-10-CM | POA: Diagnosis not present

## 2019-01-03 DIAGNOSIS — I5033 Acute on chronic diastolic (congestive) heart failure: Secondary | ICD-10-CM

## 2019-01-03 DIAGNOSIS — I110 Hypertensive heart disease with heart failure: Secondary | ICD-10-CM

## 2019-01-03 DIAGNOSIS — I495 Sick sinus syndrome: Secondary | ICD-10-CM

## 2019-01-03 DIAGNOSIS — Z950 Presence of cardiac pacemaker: Secondary | ICD-10-CM

## 2019-01-03 DIAGNOSIS — J9611 Chronic respiratory failure with hypoxia: Secondary | ICD-10-CM | POA: Diagnosis not present

## 2019-01-03 DIAGNOSIS — I10 Essential (primary) hypertension: Secondary | ICD-10-CM

## 2019-01-03 NOTE — Patient Instructions (Signed)
Medication Instructions:  Complete 3 days of 69m twice a day of Lasix then go back to 410monce tablet twice a day If you need a refill on your cardiac medications before your next appointment, please call your pharmacy.   Lab work: COMPLETE YOU LAB WORK ON Friday  If you have labs (blood work) drawn today and your tests are completely normal, you will receive your results only by: . Marland KitchenyChart Message (if you have MyChart) OR . A paper copy in the mail If you have any lab test that is abnormal or we need to change your treatment, we will call you to review the results.  Testing/Procedures: NONE   Follow-Up: [facility]  As part of our continuing mission to provide you with exceptional heart care, we have created designated Provider Care Teams.  These Care Teams include your primary Cardiologist (physician) and Advanced Practice Providers (APPs -  Physician Assistants and Nurse Practitioners) who all work together to provide you with the care you need, when you need it.  . Your physician recommends that you schedule a follow-up appointment in: [facility] IN OFFICE  Any Other Special Instructions Will Be Listed Below (If Applicable).

## 2019-01-03 NOTE — Telephone Encounter (Signed)
Contacted patient to give AVS instructions. Patient voiced understanding.

## 2019-01-03 NOTE — Progress Notes (Signed)
Virtual Visit via Telephone Note   This visit type was conducted due to national recommendations for restrictions regarding the COVID-19 Pandemic (e.g. social distancing) in an effort to limit this patient's exposure and mitigate transmission in our community.  Due to her co-morbid illnesses, this patient is at least at moderate risk for complications without adequate follow up.  This format is felt to be most appropriate for this patient at this time.  The patient did not have access to video technology/had technical difficulties with video requiring transitioning to audio format only (telephone).  All issues noted in this document were discussed and addressed.  No physical exam could be performed with this format.  Please refer to the patient's chart for her  consent to telehealth for [facility].  Evaluation Performed:  Follow-up visit  This visit type was conducted due to national recommendations for restrictions regarding the COVID-19 Pandemic (e.g. social distancing).  This format is felt to be most appropriate for this patient at this time.  All issues noted in this document were discussed and addressed.  No physical exam was performed (except for noted visual exam findings with Video Visits).  Please refer to the patient's chart (MyChart message for video visits and phone note for telephone visits) for the patient's consent to telehealth for [facility].  Date:  01/03/2019   ID:  [name], [name] [birth year], MRN [id number]  Patient Location: Home [place] [address]   Provider location:   Home- [place]  PCP:  Biagio Borg, MD  Cardiologist:  Dr Stanford Breed Electrophysiologist:  None   Chief Complaint:  Follow up office visit from medication adjustment on 11/21/2018  History of Present Illness:    [name] [name] is a 73 y.o. female who presents via audio/video conferencing for a telehealth visit today.  Patient has multiple medical problems.  She had  nonobstructive coronary disease by catheterization in 2007.  She has COPD and is on chronic 2 L of oxygen.  She has had PAF and sick sinus syndrome and had a Biotronik pacemaker implanted in December 2014.  She has had remote breast cancer in 2005.  She has sleep apnea and obesity.  Echocardiogram done in August 2019 showed an ejection fraction of 55 to 60% with mild to moderate aortic insufficiency.  Dr. Stanford Breed saw her in the office on November 21, 2018.  She had lower extremity edema and her weight was up, he felt she was volume overloaded.  It was suggested she increase her Lasix to 80 mg twice daily for a few doses.  She tells me this did help.  She has lost 8 pounds since he saw her in March.  She denies any unusual shortness of breath.  She still has some lower extremity edema but it is improved.  The patient does not symptoms concerning for COVID-19 infection (fever, chills, cough, or new SHORTNESS OF BREATH).    Prior CV studies:   The following studies were reviewed today:   Past Medical History:  Diagnosis Date  . Anemia   . Asthma 06/13/2011   hx of  . Atrial fibrillation ([place])   . Atrial flutter ([place])   . Breast cancer ([place]) dx'd 2005   No BP check or stick in Left arm  . CAD (coronary artery disease)    a. Nonobstructive CAD 2007 (EF 75%.  RCA  proximal 75% tubular, 25% prox LAD, 25% d1, 50% D2) at [facility]. b. NSTEMI 01/2010 in setting  of respiratory failure, medical approach (not a good candidate for noninvasive eval)  . Cellulitis    hx of cellulitis in left leg  . Cervical radiculopathy 06/13/2011  . COPD (chronic obstructive pulmonary disease) ([place])    on O2  . Depression 06/13/2011  . Diverticulosis   . Esophageal dysmotility   . Gout 06/13/2011  . History of Clostridium difficile early dec 2015   no current diarrhea  . History of home oxygen therapy    2 liters per nasal cannula all the time  . Hx of dysfunctional uterine bleeding    last bleeding jan 2016, worked up for  with d and c x 2 no cause found  . Hyperlipidemia   . Hypertension   . Hypothyroidism   . Impaired glucose tolerance 11/01/2012  . Neuropathy 06/13/2011  . Obesity hypoventilation syndrome ([place]) 06/13/2011  . OSA on CPAP   . Personal history of chemotherapy   . Personal history of radiation therapy   . PFO (patent foramen ovale)   . Risk for falls   . Syncope and collapse    Bradycardia with pauses. S/P pacemaker  . Tubular adenoma of colon    Past Surgical History:  Procedure Laterality Date  . BREAST BIOPSY    . BREAST LUMPECTOMY Left 05/2004  . CARDIOVERSION N/A 03/15/2013   Procedure: CARDIOVERSION;  Surgeon: Thayer Headings, MD;  Location: [facility] ENDOSCOPY;  Service: Cardiovascular;  Laterality: N/A;  . CERVICAL BIOPSY  June 2013   at [facility] for Heavy  Bleeding  . [place] SURGERY  2004 or 2005   have cadaver bones,, screws, and plates c 5 to c 6  . COLONOSCOPY    . COLONOSCOPY WITH PROPOFOL N/A 10/22/2014   Procedure: COLONOSCOPY WITH PROPOFOL;  Surgeon: Jerene Bears, MD;  Location: [facility] ENDOSCOPY;  Service: Gastroenterology;  Laterality: N/A;  . DILATION AND CURETTAGE OF UTERUS    . LAPAROSCOPIC APPENDECTOMY  03/29/2012   Procedure: APPENDECTOMY LAPAROSCOPIC;  Surgeon: Adin Hector, MD;  Location: [facility] ORS;  Service: General;  Laterality: N/A;  . LOOP RECORDER EXPLANT  08/31/2013   Procedure: LOOP RECORDER EXPLANT;  Surgeon: Coralyn Mark, MD;  Location: [facility] CATH LAB;  Service: Cardiovascular;;  . LOOP RECORDER IMPLANT  08-28-2013; 08-31-2013   MDT LinQ implanted by Dr Rayann Heman for syncope; explanted 08-31-2013 after sinus pauses identified  . LOOP RECORDER IMPLANT N/A 08/28/2013   Procedure: LOOP RECORDER IMPLANT;  Surgeon: Coralyn Mark, MD;  Location: [facility] CATH LAB;  Service: Cardiovascular;  Laterality: N/A;  . OVARIAN CYST REMOVAL    . PACEMAKER INSERTION  08-31-2013   Biotronik Evera dual chamber pacemaker placed for neurocardiogenic syncope and sinus pauses by Dr Rayann Heman  .  PERMANENT PACEMAKER INSERTION N/A 08/31/2013   Procedure: PERMANENT PACEMAKER INSERTION;  Surgeon: Coralyn Mark, MD;  Location: [facility] CATH LAB;  Service: Cardiovascular;  Laterality: N/A;  . TEE WITHOUT CARDIOVERSION N/A 03/15/2013   Procedure: TRANSESOPHAGEAL ECHOCARDIOGRAM (TEE);  Surgeon: Thayer Headings, MD;  Location: [facility] ENDOSCOPY;  Service: Cardiovascular;  Laterality: N/A;     Current Meds  Medication Sig  . acetaminophen (TYLENOL) 325 MG tablet Take as needed per bottle  . allopurinol (ZYLOPRIM) 300 MG tablet TAKE 1 TABLET DAILY  . amitriptyline (ELAVIL) 100 MG tablet TAKE 1 TABLET BY MOUTH AT BEDTIME  . apixaban (ELIQUIS) 5 MG TABS tablet Take 1 tablet (5 mg total) by mouth 2 (two) times daily.  [name] atorvastatin (LIPITOR) 40 MG tablet TAKE 1  TABLET DAILY  . Cholecalciferol (VITAMIN D) 2000 UNITS tablet Take 2,000 Units by mouth at bedtime.  . clonazePAM (KLONOPIN) 0.5 MG tablet TAKE 1 TABLET BY MOUTH TWICE DAILY AS NEEDED FOR ANXIETY  . dextromethorphan-guaiFENesin (MUCINEX DM) 30-600 MG 12hr tablet Take 1 tablet by mouth 2 (two) times daily as needed for cough.  . DULoxetine (CYMBALTA) 30 MG capsule TAKE 2 CAPSULES AT BEDTIME  . famotidine (PEPCID) 20 MG tablet Take 20 mg by mouth every morning.   . furosemide (LASIX) 40 MG tablet TAKE 1 AND 1/2 TABLETS BY MOUTH 2 TIMES DAILY (Patient taking differently: 40 mg. )  . levothyroxine (SYNTHROID, LEVOTHROID) 200 MCG tablet TAKE 1 TABLET DAILY  . omeprazole (PRILOSEC) 40 MG capsule TAKE 1 CAPSULE DAILY  . OXYGEN 2lpm with CPAP at night and with exertion  . potassium chloride SA (K-DUR,KLOR-CON) 20 MEQ tablet TAKE 1 TABLET TWICE A DAY  . PROAIR HFA 108 (90 Base) MCG/ACT inhaler Inhale 2 puffs into the lungs every 4 (four) hours as needed for wheezing or shortness of breath.  . SYMBICORT 160-4.5 MCG/ACT inhaler INHALE 2 PUFFS BY MOUTH TWICE A DAY  . traMADol (ULTRAM) 50 MG tablet TAKE 1 TABLET BY MOUTH EVERY 8 HOURS IF NEEDED FOR PAIN  .  vitamin C (ASCORBIC ACID) 500 MG tablet Take 500 mg by mouth daily.      Allergies:   Ciprofloxacin; Cephalexin; Codeine; Dilaudid [hydromorphone]; Lisinopril; Penicillins; Cleocin [clindamycin hcl]; and Doxycycline   Social History   Tobacco Use  . Smoking status: Former Smoker    Packs/day: 1.00    Years: 20.00    Pack years: 20.00    Types: Cigarettes    Last attempt to quit: 09/20/1998    Years since quitting: 20.3  . Smokeless tobacco: Never Used  . Tobacco comment: 1ppd x 20 years  Substance Use Topics  . Alcohol use: Yes    Alcohol/week: 1.0 standard drinks    Types: 1 Glasses of wine per week    Comment: rare  . Drug use: No     Family Hx: The patient's family history includes Breast cancer in her maternal aunt; Breast cancer (age of onset: 78) in her sister; Breast cancer (age of onset: 80) in her cousin; Diabetes in her brother; Heart disease in her mother; Ovarian cancer in her sister and sister; Uterine cancer in her mother.  ROS:   Please see the history of present illness.    All other systems reviewed and are negative.   Labs/Other Tests and Data Reviewed:    Recent Labs: 07/06/2018: ALT 17; BUN 9; Creatinine 0.99; Hemoglobin 12.8; Platelet Count 188; Potassium 3.5; Sodium 143   Recent Lipid Panel Lab Results  Component Value Date/Time   CHOL 139 11/24/2017 02:55 PM   TRIG 137 11/24/2017 02:55 PM   HDL 48 11/24/2017 02:55 PM   CHOLHDL 2.9 11/24/2017 02:55 PM   CHOLHDL 4 05/12/2017 09:19 AM   LDLCALC 64 11/24/2017 02:55 PM    Wt Readings from Last 3 Encounters:  01/03/19 260 lb (117.9 kg)  11/21/18 268 lb (121.6 kg)  10/19/18 282 lb (127.9 kg)     Exam:    Vital Signs:  BP 120/61   Pulse 82   Temp (!) 94.6 F (34.8 C)   Ht 5' 5.5" (1.664 m)   Wt 260 lb (117.9 kg)   BMI 42.61 kg/m     ASSESSMENT & PLAN:    1. Acute on chronic diastolic CHF Pt reports improvement  on increased Lasix dose which will end tomorrow.   2 history of atrial  flutter-  sinus rhythm at her LOV.  Continue apixaban.  Since losing 100 pounds her blood pressure is running lower.  Dr Stanford Breed discontinued metoprolol at [place]  3 hypertension-patient's blood pressure is stable by her home reading  4  COPD- pt tells me she is on chronic O2 2L and she thinks has helped.   5 hyperlipidemia-continue statin.  6 aortic insufficiency-mild to moderate on most recent echocardiogram.  Also with mild to moderate mitral and tricuspid regurgitation.  She will need repeat studies in the future.  7 prior pacemaker-followed by electrophysiology.    COVID-19 Education: The signs and symptoms of COVID-19 were discussed with the patient and how to seek care for testing (follow up with PCP or arrange E-visit).  The importance of social distancing was discussed today.  Patient Risk:   After full review of this patients clinical status, I feel that they are at least moderate risk at this time.  Time:   Today, I have spent 24 minutes with the patient with telehealth technology discussing CHF and edema.     Medication Adjustments/Labs and Tests Ordered: Current medicines are reviewed at length with the patient today.  Concerns regarding medicines are outlined above.  Tests Ordered: No orders of the defined types were placed in this encounter.  Medication Changes: No orders of the defined types were placed in this encounter.   Disposition:  BMP Friday. OV 4-6 weeks.  Go back to usual Lasix dose after she finishes her 80 mg BID dosing.   Signed, Kerin Ransom, PA-C  01/03/2019 10:12 AM    [facility]

## 2019-01-05 DIAGNOSIS — I5032 Chronic diastolic (congestive) heart failure: Secondary | ICD-10-CM | POA: Diagnosis not present

## 2019-01-05 LAB — BASIC METABOLIC PANEL
BUN/Creatinine Ratio: 19 (ref 12–28)
BUN: 17 mg/dL (ref 8–27)
CO2: 27 mmol/L (ref 20–29)
Calcium: 10.1 mg/dL (ref 8.7–10.3)
Chloride: 96 mmol/L (ref 96–106)
Creatinine, Ser: 0.88 mg/dL (ref 0.57–1.00)
GFR calc Af Amer: 76 mL/min/{1.73_m2} (ref >59)
GFR calc non Af Amer: 66 mL/min/{1.73_m2} (ref >59)
Glucose: 85 mg/dL (ref 65–99)
Potassium: 3.8 mmol/L (ref 3.5–5.2)
Sodium: 143 mmol/L (ref 134–144)

## 2019-01-08 DIAGNOSIS — J449 Chronic obstructive pulmonary disease, unspecified: Secondary | ICD-10-CM | POA: Diagnosis not present

## 2019-02-02 DIAGNOSIS — G4733 Obstructive sleep apnea (adult) (pediatric): Secondary | ICD-10-CM | POA: Diagnosis not present

## 2019-02-02 DIAGNOSIS — I10 Essential (primary) hypertension: Secondary | ICD-10-CM | POA: Diagnosis not present

## 2019-02-05 ENCOUNTER — Other Ambulatory Visit: Payer: Self-pay

## 2019-02-05 ENCOUNTER — Ambulatory Visit (INDEPENDENT_AMBULATORY_CARE_PROVIDER_SITE_OTHER): Payer: Medicare HMO | Admitting: *Deleted

## 2019-02-05 DIAGNOSIS — I495 Sick sinus syndrome: Secondary | ICD-10-CM

## 2019-02-06 LAB — CUP PACEART REMOTE DEVICE CHECK
Date Time Interrogation Session: 20200519073342
Implantable Lead Implant Date: 20141212
Implantable Lead Implant Date: 20141212
Implantable Lead Location: 753859
Implantable Lead Location: 753860
Implantable Lead Model: 346
Implantable Lead Model: 350
Implantable Lead Serial Number: 29409274
Implantable Lead Serial Number: 29535412
Implantable Pulse Generator Implant Date: 20141212
Pulse Gen Serial Number: 68181424

## 2019-02-07 DIAGNOSIS — J449 Chronic obstructive pulmonary disease, unspecified: Secondary | ICD-10-CM | POA: Diagnosis not present

## 2019-02-14 ENCOUNTER — Encounter: Payer: Self-pay | Admitting: Cardiology

## 2019-02-14 NOTE — Progress Notes (Signed)
Remote pacemaker transmission.   

## 2019-02-21 ENCOUNTER — Telehealth: Payer: Self-pay | Admitting: Internal Medicine

## 2019-02-21 MED ORDER — BUDESONIDE-FORMOTEROL FUMARATE 160-4.5 MCG/ACT IN AERO: 2.0000 | INHALATION_SPRAY | Freq: Two times a day (BID) | RESPIRATORY_TRACT | 1 refills | Status: DC

## 2019-02-21 NOTE — Telephone Encounter (Signed)
Copied from [place] [id number]. Topic: Quick Communication - Rx Refill/Question >> Feb 21, 2019  3:31 PM Mathis Bud wrote: Medication: SYMBICORT 160-4.5 MCG/ACT inhaler   Has the patient contacted their pharmacy? No more refills   Preferred Pharmacy (with phone number or street name): [facility] [phone] - [place], [address] AT [address] [phone] (Phone) [phone] (Fax)    Agent: Please be advised that RX refills may take up to 3 business days. We ask that you follow-up with your pharmacy.

## 2019-03-08 ENCOUNTER — Encounter: Payer: Self-pay | Admitting: Internal Medicine

## 2019-03-08 MED ORDER — LEVOTHYROXINE SODIUM 200 MCG PO TABS: 200.0000 ug | ORAL_TABLET | Freq: Every day | ORAL | 1 refills | Status: DC

## 2019-03-10 DIAGNOSIS — J449 Chronic obstructive pulmonary disease, unspecified: Secondary | ICD-10-CM | POA: Diagnosis not present

## 2019-04-03 ENCOUNTER — Telehealth: Payer: Self-pay | Admitting: Student

## 2019-04-03 NOTE — Telephone Encounter (Signed)
I let the pt know that [employer] sent [place] an Alert that her monitor has not communicated in 21 days. Pt states her cpap machine is between her and the monitor. I told her nothing should be in between her and the monitor. She states she had it set up like this for years. I gave her the number to biotronik tech support to get additional help.

## 2019-04-03 NOTE — Telephone Encounter (Signed)
[employer] alert that her device is not communicating. LMOM on machine for her to call device clinic to troubleshoot.    Legrand Como [address] [place], PA-C 04/03/2019 1:23 PM

## 2019-04-04 NOTE — Telephone Encounter (Signed)
I let the pt know that her monitor did update in [employer] and she states that [employer] is sending her a new monitor.

## 2019-04-09 DIAGNOSIS — J449 Chronic obstructive pulmonary disease, unspecified: Secondary | ICD-10-CM | POA: Diagnosis not present

## 2019-04-30 ENCOUNTER — Telehealth: Payer: Self-pay | Admitting: Emergency Medicine

## 2019-04-30 NOTE — Telephone Encounter (Signed)
LM with [place]( per DPR) for patient to contact DC about home monitor. Need to confirm if she received new monitor from [place].

## 2019-05-01 NOTE — Telephone Encounter (Signed)
Spoke w/ pt and she stated that she received the monitor. She stated that when she plugs it in nothing happens. I had the pt try several different outlets and the monitor would not turn on. Instructed pt to call [employer] for further help trouble shooting the monitor. Pt verbalized understanding.

## 2019-05-02 NOTE — Telephone Encounter (Signed)
LMOM for pt to clbk to sched virtual visit with Malka So, PA

## 2019-05-02 NOTE — Telephone Encounter (Signed)
Missed call back from patient due to being on another line, called back and LMOVM.

## 2019-05-02 NOTE — Telephone Encounter (Addendum)
Spoke with patient to advise that monitor updated overnight. AF noted on periodic IEGM from 05/02/19 at 02:30, V rate 125bpm. 34% burden in past 24 hours. 1 HVR noted, no EGM available. Pt reports compliance with Eliquis. Pt reports yesterday she felt very shaky yesterday, ShOB intermittently worse throughout day. Still doesn't feel at baseline this morning, is audibly ShOB with exertion but not at rest. Pt denies any other fluid retention symptoms, palpitations, or chest discomfort. Stayed on the phone with patient while she checked her BP/HR. BP 123/80, HR 120bpm. Wears 2L O2 at baseline, isn't sure where her pulse ox is. Pt was previously on metoprolol tartrate 56m BID, but medication was d/c due to hypotension/weakness per note from Dr. CStanford Breedon 11/21/18. Advised pt I will discuss with Dr. ARayann Hemanand call her back with recommendations. Pt is agreeable to this plan.

## 2019-05-02 NOTE — Telephone Encounter (Signed)
Spoke with patient. Advised of Dr. Jackalyn Lombard recommendations to f/u with AF Clinic. Pt requests a virtual visit as she is very uncomfortable coming into the office right now due to [place]. Advised I will send this message to request the next available appointment. AF Clinic phone number given to pt. ED precautions given for any new or worsening symptoms. Pt is agreeable to this plan and thanked me for my call.  Presenting rhythm as of 05/02/19 at 02:30:

## 2019-05-02 NOTE — Telephone Encounter (Signed)
Per Dr. Rayann Heman, patient should f/u in AF Clinic.  LMOVM requesting call back to DC. Gave direct number for return call.

## 2019-05-03 ENCOUNTER — Ambulatory Visit (HOSPITAL_BASED_OUTPATIENT_CLINIC_OR_DEPARTMENT_OTHER)
Admission: RE | Admit: 2019-05-03 | Discharge: 2019-05-03 | Disposition: A | Discharge status: Home / Self Care | Payer: Medicare HMO | Source: Ambulatory Visit | Attending: Physician Assistant | Admitting: Physician Assistant

## 2019-05-03 ENCOUNTER — Other Ambulatory Visit: Payer: Self-pay

## 2019-05-03 ENCOUNTER — Encounter (HOSPITAL_COMMUNITY): Payer: Self-pay | Admitting: *Deleted

## 2019-05-03 ENCOUNTER — Emergency Department (HOSPITAL_COMMUNITY): Payer: Medicare HMO

## 2019-05-03 ENCOUNTER — Inpatient Hospital Stay (HOSPITAL_COMMUNITY)
Admission: EM | Admit: 2019-05-03 | Discharge: 2019-05-05 | DRG: 683 | Disposition: A | Discharge status: Home / Self Care | Payer: Medicare HMO | Attending: Internal Medicine | Admitting: Internal Medicine

## 2019-05-03 DIAGNOSIS — N179 Acute kidney failure, unspecified: Secondary | ICD-10-CM | POA: Diagnosis present

## 2019-05-03 DIAGNOSIS — I10 Essential (primary) hypertension: Secondary | ICD-10-CM | POA: Diagnosis not present

## 2019-05-03 DIAGNOSIS — Z8249 Family history of ischemic heart disease and other diseases of the circulatory system: Secondary | ICD-10-CM

## 2019-05-03 DIAGNOSIS — Z6841 Body Mass Index (BMI) 40.0 and over, adult: Secondary | ICD-10-CM

## 2019-05-03 DIAGNOSIS — Z7901 Long term (current) use of anticoagulants: Secondary | ICD-10-CM

## 2019-05-03 DIAGNOSIS — R0602 Shortness of breath: Secondary | ICD-10-CM

## 2019-05-03 DIAGNOSIS — I480 Paroxysmal atrial fibrillation: Secondary | ICD-10-CM | POA: Diagnosis not present

## 2019-05-03 DIAGNOSIS — E785 Hyperlipidemia, unspecified: Secondary | ICD-10-CM | POA: Diagnosis present

## 2019-05-03 DIAGNOSIS — I110 Hypertensive heart disease with heart failure: Secondary | ICD-10-CM | POA: Diagnosis present

## 2019-05-03 DIAGNOSIS — Z20828 Contact with and (suspected) exposure to other viral communicable diseases: Secondary | ICD-10-CM | POA: Diagnosis not present

## 2019-05-03 DIAGNOSIS — I4892 Unspecified atrial flutter: Secondary | ICD-10-CM | POA: Diagnosis present

## 2019-05-03 DIAGNOSIS — Z9181 History of falling: Secondary | ICD-10-CM

## 2019-05-03 DIAGNOSIS — Z803 Family history of malignant neoplasm of breast: Secondary | ICD-10-CM

## 2019-05-03 DIAGNOSIS — Z8049 Family history of malignant neoplasm of other genital organs: Secondary | ICD-10-CM

## 2019-05-03 DIAGNOSIS — R0902 Hypoxemia: Secondary | ICD-10-CM | POA: Diagnosis not present

## 2019-05-03 DIAGNOSIS — I959 Hypotension, unspecified: Secondary | ICD-10-CM | POA: Diagnosis present

## 2019-05-03 DIAGNOSIS — Z9981 Dependence on supplemental oxygen: Secondary | ICD-10-CM | POA: Diagnosis not present

## 2019-05-03 DIAGNOSIS — Z880 Allergy status to penicillin: Secondary | ICD-10-CM

## 2019-05-03 DIAGNOSIS — E875 Hyperkalemia: Secondary | ICD-10-CM | POA: Diagnosis present

## 2019-05-03 DIAGNOSIS — Z79891 Long term (current) use of opiate analgesic: Secondary | ICD-10-CM

## 2019-05-03 DIAGNOSIS — I4891 Unspecified atrial fibrillation: Secondary | ICD-10-CM | POA: Diagnosis not present

## 2019-05-03 DIAGNOSIS — E039 Hypothyroidism, unspecified: Secondary | ICD-10-CM | POA: Diagnosis present

## 2019-05-03 DIAGNOSIS — E662 Morbid (severe) obesity with alveolar hypoventilation: Secondary | ICD-10-CM | POA: Diagnosis present

## 2019-05-03 DIAGNOSIS — Z923 Personal history of irradiation: Secondary | ICD-10-CM

## 2019-05-03 DIAGNOSIS — Z9221 Personal history of antineoplastic chemotherapy: Secondary | ICD-10-CM

## 2019-05-03 DIAGNOSIS — Z950 Presence of cardiac pacemaker: Secondary | ICD-10-CM

## 2019-05-03 DIAGNOSIS — M109 Gout, unspecified: Secondary | ICD-10-CM | POA: Diagnosis present

## 2019-05-03 DIAGNOSIS — I2510 Atherosclerotic heart disease of native coronary artery without angina pectoris: Secondary | ICD-10-CM | POA: Diagnosis not present

## 2019-05-03 DIAGNOSIS — I499 Cardiac arrhythmia, unspecified: Secondary | ICD-10-CM | POA: Diagnosis not present

## 2019-05-03 DIAGNOSIS — M5412 Radiculopathy, cervical region: Secondary | ICD-10-CM | POA: Diagnosis present

## 2019-05-03 DIAGNOSIS — Z853 Personal history of malignant neoplasm of breast: Secondary | ICD-10-CM

## 2019-05-03 DIAGNOSIS — Z79899 Other long term (current) drug therapy: Secondary | ICD-10-CM

## 2019-05-03 DIAGNOSIS — E860 Dehydration: Secondary | ICD-10-CM | POA: Diagnosis present

## 2019-05-03 DIAGNOSIS — Z87891 Personal history of nicotine dependence: Secondary | ICD-10-CM | POA: Diagnosis not present

## 2019-05-03 DIAGNOSIS — G4733 Obstructive sleep apnea (adult) (pediatric): Secondary | ICD-10-CM | POA: Diagnosis not present

## 2019-05-03 DIAGNOSIS — Z79890 Hormone replacement therapy (postmenopausal): Secondary | ICD-10-CM | POA: Diagnosis not present

## 2019-05-03 DIAGNOSIS — J449 Chronic obstructive pulmonary disease, unspecified: Secondary | ICD-10-CM | POA: Diagnosis present

## 2019-05-03 DIAGNOSIS — R0689 Other abnormalities of breathing: Secondary | ICD-10-CM | POA: Diagnosis not present

## 2019-05-03 LAB — BASIC METABOLIC PANEL
Anion gap: 9 (ref 5–15)
BUN: 36 mg/dL — ABNORMAL HIGH (ref 8–23)
CO2: 28 mmol/L (ref 22–32)
Calcium: 9.5 mg/dL (ref 8.9–10.3)
Chloride: 106 mmol/L (ref 98–111)
Creatinine, Ser: 1.96 mg/dL — ABNORMAL HIGH (ref 0.44–1.00)
GFR calc Af Amer: 29 mL/min — ABNORMAL LOW (ref >60)
GFR calc non Af Amer: 25 mL/min — ABNORMAL LOW (ref >60)
Glucose, Bld: 96 mg/dL (ref 70–99)
Potassium: 5.7 mmol/L — ABNORMAL HIGH (ref 3.5–5.1)
Sodium: 143 mmol/L (ref 135–145)

## 2019-05-03 LAB — CBC
HCT: 40.6 % (ref 36.0–46.0)
Hemoglobin: 11.8 g/dL — ABNORMAL LOW (ref 12.0–15.0)
MCH: 28.1 pg (ref 26.0–34.0)
MCHC: 29.1 g/dL — ABNORMAL LOW (ref 30.0–36.0)
MCV: 96.7 fL (ref 80.0–100.0)
Platelets: 178 10*3/uL (ref 150–400)
RBC: 4.2 MIL/uL (ref 3.87–5.11)
RDW: 17.4 % — ABNORMAL HIGH (ref 11.5–15.5)
WBC: 6.7 10*3/uL (ref 4.0–10.5)
nRBC: 0.5 % — ABNORMAL HIGH (ref 0.0–0.2)

## 2019-05-03 LAB — MAGNESIUM: Magnesium: 2.4 mg/dL (ref 1.7–2.4)

## 2019-05-03 LAB — SARS CORONAVIRUS 2 BY RT PCR (HOSPITAL ORDER, PERFORMED IN ~~LOC~~ HOSPITAL LAB): SARS Coronavirus 2: NEGATIVE

## 2019-05-03 LAB — GLUCOSE, CAPILLARY: Glucose-Capillary: 82 mg/dL (ref 70–99)

## 2019-05-03 LAB — PHOSPHORUS: Phosphorus: 6.5 mg/dL — ABNORMAL HIGH (ref 2.5–4.6)

## 2019-05-03 LAB — HEMOGLOBIN A1C
Hgb A1c MFr Bld: 5.2 % (ref 4.8–5.6)
Mean Plasma Glucose: 102.54 mg/dL

## 2019-05-03 LAB — TSH: TSH: 3.536 u[IU]/mL (ref 0.350–4.500)

## 2019-05-03 MED ORDER — APIXABAN 5 MG PO TABS
5.0000 mg | ORAL_TABLET | Freq: Two times a day (BID) | ORAL | Status: DC
  Administered 2019-05-03 – 2019-05-05 (×4): 5 mg via ORAL
  Filled 2019-05-03 (×4): qty 1

## 2019-05-03 MED ORDER — INSULIN ASPART 100 UNIT/ML ~~LOC~~ SOLN: 0.0000 [IU] | Freq: Three times a day (TID) | SUBCUTANEOUS | Status: DC

## 2019-05-03 MED ORDER — CLONAZEPAM 0.5 MG PO TABS: 0.5000 mg | ORAL_TABLET | Freq: Two times a day (BID) | ORAL | Status: DC | PRN

## 2019-05-03 MED ORDER — INSULIN ASPART 100 UNIT/ML ~~LOC~~ SOLN: 0.0000 [IU] | Freq: Every day | SUBCUTANEOUS | Status: DC

## 2019-05-03 MED ORDER — DEXTROSE 50 % IV SOLN: 1.0000 | Freq: Once | INTRAVENOUS | Status: DC

## 2019-05-03 MED ORDER — LEVOTHYROXINE SODIUM 100 MCG PO TABS
200.0000 ug | ORAL_TABLET | Freq: Every day | ORAL | Status: DC
  Administered 2019-05-04 – 2019-05-05 (×2): 200 ug via ORAL
  Filled 2019-05-03 (×2): qty 2

## 2019-05-03 MED ORDER — METOPROLOL TARTRATE 5 MG/5ML IV SOLN: 5.0000 mg | Freq: Once | INTRAVENOUS | Status: DC

## 2019-05-03 MED ORDER — SODIUM CHLORIDE 0.9 % IV SOLN
INTRAVENOUS | Status: AC
  Administered 2019-05-03: 22:00:00 via INTRAVENOUS

## 2019-05-03 MED ORDER — SODIUM CHLORIDE 0.9 % IV BOLUS: 1000.0000 mL | Freq: Once | INTRAVENOUS | Status: DC

## 2019-05-03 MED ORDER — SODIUM ZIRCONIUM CYCLOSILICATE 10 G PO PACK
10.0000 g | PACK | Freq: Once | ORAL | Status: AC
  Administered 2019-05-03: 20:00:00 10 g via ORAL
  Filled 2019-05-03: qty 1

## 2019-05-03 MED ORDER — INSULIN ASPART 100 UNIT/ML IV SOLN: 5.0000 [IU] | Freq: Once | INTRAVENOUS | Status: DC

## 2019-05-03 MED ORDER — ETOMIDATE 2 MG/ML IV SOLN
10.0000 mg | Freq: Once | INTRAVENOUS | Status: DC
  Filled 2019-05-03: qty 10

## 2019-05-03 NOTE — ED Triage Notes (Signed)
PT 's IV infiltrated  Unable to do Conversion . DR Sedonia Small to to start  Second  IV site.

## 2019-05-03 NOTE — Progress Notes (Signed)
Electrophysiology TeleHealth Note   Due to national recommendations of social distancing due to COVID 19, patient refused in person visit at this time.  See consent below from today for patient consent regarding telehealth for the Atrial Fibrillation Clinic. Consent obtained verbally.    Date:  05/03/2019   ID:  Rich Number, DOB May 28, 1946, MRN [id number]  Location: home  Provider location: [address] [place], [place] [address] Evaluation Performed: Follow up  PCP:  Biagio Borg, MD  Primary Cardiologist:  Dr Stanford Breed  Primary Electrophysiologist: Dr Lovena Le   CC: Follow up for atrial fibrillation.    History of Present Illness: [name] [name] is a 73 y.o. female who presents via audio conferencing for a telehealth visit today. Patient reports that for the last three days she has "not felt like herself." She has had diarrhea and a rapid heart rate. Her remote device interrogation showed afib with RVR yesterday and she was referred to the Afib clinic. Today, patient reports her BP is 88/67 with HR of 124. She states that she is unable to get out of her chair without becoming presyncopal. Her chronic SOB has also worsened over the last couple of days.  Today, she denies symptoms of chest pain, orthopnea, PND, lower extremity edema, claudication, dizziness, presyncope, syncope, bleeding, or neurologic sequela. The patient is tolerating medications without difficulties and is otherwise without complaint today.   she denies symptoms of cough, fevers, chills, or new SOB worrisome for COVID 19.    Atrial Fibrillation Risk Factors:  she does have symptoms or diagnosis of sleep apnea. she is compliant with CPAP therapy.  she has a BMI of There is no height or weight on file to calculate BMI..  BP 88/67 Pulse 124 Reported by patient with home BP machine.  Past Medical History:  Diagnosis Date  . Anemia   . Asthma 06/13/2011   hx of  . Atrial fibrillation ([place])   . Atrial  flutter ([place])   . Breast cancer ([place]) dx'd 2005   No BP check or stick in Left arm  . CAD (coronary artery disease)    a. Nonobstructive CAD 2007 (EF 75%.  RCA  proximal 75% tubular, 25% prox LAD, 25% d1, 50% D2) at [facility]. b. NSTEMI 01/2010 in setting of respiratory failure, medical approach (not a good candidate for noninvasive eval)  . Cellulitis    hx of cellulitis in left leg  . Cervical radiculopathy 06/13/2011  . COPD (chronic obstructive pulmonary disease) ([place])    on O2  . Depression 06/13/2011  . Diverticulosis   . Esophageal dysmotility   . Gout 06/13/2011  . History of Clostridium difficile early dec 2015   no current diarrhea  . History of home oxygen therapy    2 liters per nasal cannula all the time  . Hx of dysfunctional uterine bleeding    last bleeding jan 2016, worked up for with d and c x 2 no cause found  . Hyperlipidemia   . Hypertension   . Hypothyroidism   . Impaired glucose tolerance 11/01/2012  . Neuropathy 06/13/2011  . Obesity hypoventilation syndrome ([place]) 06/13/2011  . OSA on CPAP   . Personal history of chemotherapy   . Personal history of radiation therapy   . PFO (patent foramen ovale)   . Risk for falls   . Syncope and collapse    Bradycardia with pauses. S/P pacemaker  . Tubular adenoma of colon    Past Surgical History:  Procedure Laterality Date  . BREAST BIOPSY    . BREAST LUMPECTOMY Left 05/2004  . CARDIOVERSION N/A 03/15/2013   Procedure: CARDIOVERSION;  Surgeon: Thayer Headings, MD;  Location: [facility] ENDOSCOPY;  Service: Cardiovascular;  Laterality: N/A;  . CERVICAL BIOPSY  June 2013   at [facility] for Heavy  Bleeding  . [place] SURGERY  2004 or 2005   have cadaver bones,, screws, and plates c 5 to c 6  . COLONOSCOPY    . COLONOSCOPY WITH PROPOFOL N/A 10/22/2014   Procedure: COLONOSCOPY WITH PROPOFOL;  Surgeon: Jerene Bears, MD;  Location: [facility] ENDOSCOPY;  Service: Gastroenterology;  Laterality: N/A;  . DILATION AND CURETTAGE OF UTERUS    .  LAPAROSCOPIC APPENDECTOMY  03/29/2012   Procedure: APPENDECTOMY LAPAROSCOPIC;  Surgeon: Adin Hector, MD;  Location: [facility] ORS;  Service: General;  Laterality: N/A;  . LOOP RECORDER EXPLANT  08/31/2013   Procedure: LOOP RECORDER EXPLANT;  Surgeon: Coralyn Mark, MD;  Location: [facility] CATH LAB;  Service: Cardiovascular;;  . LOOP RECORDER IMPLANT  08-28-2013; 08-31-2013   MDT LinQ implanted by Dr Rayann Heman for syncope; explanted 08-31-2013 after sinus pauses identified  . LOOP RECORDER IMPLANT N/A 08/28/2013   Procedure: LOOP RECORDER IMPLANT;  Surgeon: Coralyn Mark, MD;  Location: [facility] CATH LAB;  Service: Cardiovascular;  Laterality: N/A;  . OVARIAN CYST REMOVAL    . PACEMAKER INSERTION  08-31-2013   Biotronik [place] dual chamber pacemaker placed for neurocardiogenic syncope and sinus pauses by Dr Rayann Heman  . PERMANENT PACEMAKER INSERTION N/A 08/31/2013   Procedure: PERMANENT PACEMAKER INSERTION;  Surgeon: Coralyn Mark, MD;  Location: [facility] CATH LAB;  Service: Cardiovascular;  Laterality: N/A;  . TEE WITHOUT CARDIOVERSION N/A 03/15/2013   Procedure: TRANSESOPHAGEAL ECHOCARDIOGRAM (TEE);  Surgeon: Thayer Headings, MD;  Location: [facility] ENDOSCOPY;  Service: Cardiovascular;  Laterality: N/A;     Current Outpatient Medications  Medication Sig Dispense Refill  . acetaminophen (TYLENOL) 325 MG tablet Take as needed per bottle    . allopurinol (ZYLOPRIM) 300 MG tablet TAKE 1 TABLET DAILY 90 tablet 1  . amitriptyline (ELAVIL) 100 MG tablet TAKE 1 TABLET BY MOUTH AT BEDTIME 30 tablet 2  . apixaban (ELIQUIS) 5 MG TABS tablet Take 1 tablet (5 mg total) by mouth 2 (two) times daily. 42 tablet 0  . atorvastatin (LIPITOR) 40 MG tablet TAKE 1 TABLET DAILY 90 tablet 4  . budesonide-formoterol (SYMBICORT) 160-4.5 MCG/ACT inhaler Inhale 2 puffs into the lungs 2 (two) times daily. 10.2 g 1  . Cholecalciferol (VITAMIN D) 2000 UNITS tablet Take 2,000 Units by mouth at bedtime.    . clonazePAM (KLONOPIN) 0.5 MG tablet TAKE 1  TABLET BY MOUTH TWICE DAILY AS NEEDED FOR ANXIETY 60 tablet 2  . dextromethorphan-guaiFENesin (MUCINEX DM) 30-600 MG 12hr tablet Take 1 tablet by mouth 2 (two) times daily as needed for cough.    . DULoxetine (CYMBALTA) 30 MG capsule TAKE 2 CAPSULES AT BEDTIME 180 capsule 1  . famotidine (PEPCID) 20 MG tablet Take 20 mg by mouth every morning.     . furosemide (LASIX) 40 MG tablet TAKE 1 AND 1/2 TABLETS BY MOUTH 2 TIMES DAILY (Patient taking differently: 40 mg. ) 90 tablet 2  . levothyroxine (SYNTHROID) 200 MCG tablet Take 1 tablet (200 mcg total) by mouth daily. 90 tablet 1  . omeprazole (PRILOSEC) 40 MG capsule TAKE 1 CAPSULE DAILY 90 capsule 0  . OXYGEN 2lpm with CPAP at night and with exertion    .  potassium chloride SA (K-DUR,KLOR-CON) 20 MEQ tablet TAKE 1 TABLET TWICE A DAY 180 tablet 3  . PROAIR HFA 108 (90 Base) MCG/ACT inhaler Inhale 2 puffs into the lungs every 4 (four) hours as needed for wheezing or shortness of breath. 1 Inhaler 4  . traMADol (ULTRAM) 50 MG tablet TAKE 1 TABLET BY MOUTH EVERY 8 HOURS IF NEEDED FOR PAIN 90 tablet 5  . vitamin C (ASCORBIC ACID) 500 MG tablet Take 500 mg by mouth daily.      No current facility-administered medications for this visit.     Allergies:   Ciprofloxacin, Cephalexin, Codeine, Dilaudid [hydromorphone], Lisinopril, Penicillins, Cleocin [clindamycin hcl], and Doxycycline   Social History:  The patient  reports that she quit smoking about 20 years ago. Her smoking use included cigarettes. She has a 20.00 pack-year smoking history. She has never used smokeless tobacco. She reports current alcohol use of about 1.0 standard drinks of alcohol per week. She reports that she does not use drugs.   Family History:  The patient's  family history includes Breast cancer in her maternal aunt; Breast cancer (age of onset: 40) in her sister; Breast cancer (age of onset: 31) in her cousin; Diabetes in her brother; Heart disease in her mother; Ovarian cancer in  her sister and sister; Uterine cancer in her mother.    ROS:  Please see the history of present illness.   All other systems are personally reviewed and negative.    Recent Labs: 07/06/2018: ALT 17; Hemoglobin 12.8; Platelet Count 188 01/05/2019: BUN 17; Creatinine, Ser 0.88; Potassium 3.8; Sodium 143  personally reviewed    Other studies personally reviewed: Additional studies/ records that were reviewed today include: Epic notes    ASSESSMENT AND PLAN:  1.  Paroxysmal atrial fibrillation/atrial flutter Patient has a history of atrial flutter with new onset afib with RVR. ? Electrolyte imbalance with persistent diarrhea.  Given patient's severe symptoms and low BP, I have recommended that she go to the ER for evaluation. Patient hesitant at first 2/2 fear of COVID but agreeable to the plan. She reports that she has not missed any doses of her Eliquis in the last 3 weeks.  This patients CHA2DS2-VASc Score and unadjusted Ischemic Stroke Rate (% per year) is equal to 4.8 % stroke rate/year from a score of 4  Above score calculated as 1 point each if present [CHF, HTN, DM, Vascular=MI/PAD/Aortic Plaque, Age if 65-74, or Female] Above score calculated as 2 points each if present [Age > 75, or Stroke/TIA/TE]   Follow-up in the ER for evaluation.   Patient Risk:  after full review of this patients clinical status, I feel that they are at high risk at this time.   Today, I have spent 25 minutes with the patient with telehealth technology discussing the above.    Gwenlyn Perking PA-C 05/03/2019 2:30 PM  Af[facility] [address] [place], [place] [address] [phone]   I hereby voluntarily request, consent and authorize the [facility] and its employed or contracted physicians, physician assistants, nurse practitioners or other licensed health care professionals (the Practitioner), to provide me with telemedicine health care  services (the "Services") as deemed necessary by the treating Practitioner. I acknowledge and consent to receive the Services by the Practitioner via telemedicine. I understand that the telemedicine visit will involve communicating with the Practitioner through live audiovisual communication technology and the disclosure of certain medical information by electronic transmission. I acknowledge that I have  been given the opportunity to request an in-person assessment or other available alternative prior to the telemedicine visit and am voluntarily participating in the telemedicine visit.   I understand that I have the right to withhold or withdraw my consent to the use of telemedicine in the course of my care at any time, without affecting my right to future care or treatment, and that the Practitioner or I may terminate the telemedicine visit at any time. I understand that I have the right to inspect all information obtained and/or recorded in the course of the telemedicine visit and may receive copies of available information for a reasonable fee.  I understand that some of the potential risks of receiving the Services via telemedicine include:   Delay or interruption in medical evaluation due to technological equipment failure or disruption;  Information transmitted may not be sufficient (e.g. poor resolution of images) to allow for appropriate medical decision making by the Practitioner; and/or  In rare instances, security protocols could fail, causing a breach of personal health information.   Furthermore, I acknowledge that it is my responsibility to provide information about my medical history, conditions and care that is complete and accurate to the best of my ability. I acknowledge that Practitioner's advice, recommendations, and/or decision may be based on factors not within their control, such as incomplete or inaccurate data provided by me or distortions of diagnostic images or specimens that may  result from electronic transmissions. I understand that the practice of medicine is not an exact science and that Practitioner makes no warranties or guarantees regarding treatment outcomes. I acknowledge that I will receive a copy of this consent concurrently upon execution via email to the email address I last provided but may also request a printed copy by calling the office of the [facility].  I understand that my insurance will be billed for this visit.   I have read or had this consent read to me.  I understand the contents of this consent, which adequately explains the benefits and risks of the Services being provided via telemedicine.  I have been provided ample opportunity to ask questions regarding this consent and the Services and have had my questions answered to my satisfaction.  I give my informed consent for the services to be provided through the use of telemedicine in my medical care  By participating in this telemedicine visit I agree to the above.

## 2019-05-03 NOTE — ED Triage Notes (Signed)
PT A/O and speaking in full sentences. Pt received  A total of 20 mg Etomidate with no sedation that occurred.

## 2019-05-03 NOTE — H&P (Signed)
History and Physical  FARA WORTHY HAF:[id number] DOB: [birth year] DOA: 05/03/2019  Referring physician: ER provider PCP: Biagio Borg, MD  Outpatient Specialists: Cardiologist/A. fib clinic Patient coming from: Home  Chief Complaint: Worsening chronic shortness of breath, A. fib with RVR, diarrhea and hypotension.  HPI: Patient is a 73 year old female with multiple cardiac and medical problems.  Patient carries diagnosis of chronic atrial fibrillation/flutter, coronary artery disease, breast cancer, COPD on home oxygen 2 L/min via nasal cannula, hypertension, hyperlipidemia, hypothyroidism, obesity hypoventilation syndrome, OSA on CPAP, PFO, syncope and collapse amongst other medical and cardiac history.  Apparently, patient contacted the atrial fibrillation clinic because of 3-day history of diarrhea, rapid heart rate (124 bpm), presyncope and hypotension (blood pressure of 88/67 mmHg with worsening chronic shortness of breath.  Patient was on diuretics prior to presentation.  Patient was advised to come to the ER for further assessment and management.  In the ER, patient was cardioverted, but was noted to be more hypotensive, with blood pressure of 74/64 mmHg.  Elevated serum creatinine of 1.96 was also noted, up from 0.8.  Potassium of 5.7 was noted on presentation, however, patient was on potassium supplements prior to presentation.  Patient was seen alongside patient's daughter.  No headache, no neck pain, no chest pain, no shortness of breath, no fever or chills, no URI symptoms, no urinary symptoms.  Last diarrhea stool was earlier this morning.  Hospitalist team has been asked to admit patient for further assessment and management.  ED Course: On presentation to the ER, patient was afebrile, heart rate ranged from 40 to 126 bpm, respiratory rate of 18, blood pressure of 74-138/64-96 mmHg and O2 sat of 99%.  Pertinent lab reveals hemoglobin of 11.8, WBC of 6.7, platelet of 178, potassium of  5.7, BUN of 36, creatinine of 1.90 with CO2 of 28.  Pertinent labs: Above.  EKG: Independently reviewed.  Patient has remained in normal sinus rhythm after cardioversion.  Imaging: independently reviewed.   Review of Systems:  Negative for fever, visual changes, sore throat, rash, new muscle aches, chest pain, dysuria, bleeding.  Past Medical History:  Diagnosis Date  . Anemia   . Asthma 06/13/2011   hx of  . Atrial fibrillation ([place])   . Atrial flutter ([place])   . Breast cancer ([place]) dx'd 2005   No BP check or stick in Left arm  . CAD (coronary artery disease)    a. Nonobstructive CAD 2007 (EF 75%.  RCA  proximal 75% tubular, 25% prox LAD, 25% d1, 50% D2) at [facility] b. NSTEMI 01/2010 in setting of respiratory failure, medical approach (not a good candidate for noninvasive eval)  . Cellulitis    hx of cellulitis in left leg  . Cervical radiculopathy 06/13/2011  . COPD (chronic obstructive pulmonary disease) ([place])    on O2  . Depression 06/13/2011  . Diverticulosis   . Esophageal dysmotility   . Gout 06/13/2011  . History of Clostridium difficile early dec 2015   no current diarrhea  . History of home oxygen therapy    2 liters per nasal cannula all the time  . Hx of dysfunctional uterine bleeding    last bleeding jan 2016, worked up for with d and c x 2 no cause found  . Hyperlipidemia   . Hypertension   . Hypothyroidism   . Impaired glucose tolerance 11/01/2012  . Neuropathy 06/13/2011  . Obesity hypoventilation syndrome ([place]) 06/13/2011  . OSA on CPAP   . Personal history of  chemotherapy   . Personal history of radiation therapy   . PFO (patent foramen ovale)   . Risk for falls   . Syncope and collapse    Bradycardia with pauses. S/P pacemaker  . Tubular adenoma of colon     Past Surgical History:  Procedure Laterality Date  . BREAST BIOPSY    . BREAST LUMPECTOMY Left 05/2004  . CARDIOVERSION N/A 03/15/2013   Procedure: CARDIOVERSION;  Surgeon: Thayer Headings, MD;   Location: [facility] ENDOSCOPY;  Service: Cardiovascular;  Laterality: N/A;  . CERVICAL BIOPSY  June 2013   at [facility] for Heavy  Bleeding  . [place] SURGERY  2004 or 2005   have cadaver bones,, screws, and plates c 5 to c 6  . COLONOSCOPY    . COLONOSCOPY WITH PROPOFOL N/A 10/22/2014   Procedure: COLONOSCOPY WITH PROPOFOL;  Surgeon: Jerene Bears, MD;  Location: [facility] ENDOSCOPY;  Service: Gastroenterology;  Laterality: N/A;  . DILATION AND CURETTAGE OF UTERUS    . LAPAROSCOPIC APPENDECTOMY  03/29/2012   Procedure: APPENDECTOMY LAPAROSCOPIC;  Surgeon: Adin Hector, MD;  Location: [facility] ORS;  Service: General;  Laterality: N/A;  . LOOP RECORDER EXPLANT  08/31/2013   Procedure: LOOP RECORDER EXPLANT;  Surgeon: Coralyn Mark, MD;  Location: [facility] CATH LAB;  Service: Cardiovascular;;  . LOOP RECORDER IMPLANT  08-28-2013; 08-31-2013   MDT LinQ implanted by Dr Rayann Heman for syncope; explanted 08-31-2013 after sinus pauses identified  . LOOP RECORDER IMPLANT N/A 08/28/2013   Procedure: LOOP RECORDER IMPLANT;  Surgeon: Coralyn Mark, MD;  Location: [place] CATH LAB;  Service: Cardiovascular;  Laterality: N/A;  . OVARIAN CYST REMOVAL    . PACEMAKER INSERTION  08-31-2013   Biotronik Evera dual chamber pacemaker placed for neurocardiogenic syncope and sinus pauses by Dr Rayann Heman  . PERMANENT PACEMAKER INSERTION N/A 08/31/2013   Procedure: PERMANENT PACEMAKER INSERTION;  Surgeon: Coralyn Mark, MD;  Location: [place] CATH LAB;  Service: Cardiovascular;  Laterality: N/A;  . TEE WITHOUT CARDIOVERSION N/A 03/15/2013   Procedure: TRANSESOPHAGEAL ECHOCARDIOGRAM (TEE);  Surgeon: Thayer Headings, MD;  Location: [facility] ENDOSCOPY;  Service: Cardiovascular;  Laterality: N/A;     reports that she quit smoking about 20 years ago. Her smoking use included cigarettes. She has a 20.00 pack-year smoking history. She has never used smokeless tobacco. She reports current alcohol use of about 1.0 standard drinks of alcohol per week. She reports that she  does not use drugs.  Allergies  Allergen Reactions  . Ciprofloxacin Other (See Comments)    foliculitis  . Cephalexin Other (See Comments)    Exfoliative dermatitis reaction  . Codeine     REACTION: unknown - pt. states unaware of having reaction to codeine  . Dilaudid [Hydromorphone] Other (See Comments)    Other Reaction: Oversedation  . Lisinopril     REACTION: cough  . Penicillins     rash  . Cleocin [Clindamycin Hcl] Rash    rash  . Doxycycline Rash    Family History  Problem Relation Age of Onset  . Uterine cancer Mother   . Heart disease Mother   . Breast cancer Sister 10  . Ovarian cancer Sister   . Breast cancer Cousin 1  . Diabetes Brother   . Breast cancer Maternal Aunt   . Ovarian cancer Sister      Prior to Admission medications   Medication Sig Start Date End Date Taking? Authorizing Provider  acetaminophen (TYLENOL) 325 MG tablet Take as needed per bottle  [provider]  allopurinol (ZYLOPRIM) 300 MG tablet TAKE 1 TABLET DAILY 05/17/17   Biagio Borg, MD  amitriptyline (ELAVIL) 100 MG tablet TAKE 1 TABLET BY MOUTH AT BEDTIME 04/18/18   Biagio Borg, MD  apixaban (ELIQUIS) 5 MG TABS tablet Take 1 tablet (5 mg total) by mouth 2 (two) times daily. 11/21/18   Lelon Perla, MD  atorvastatin (LIPITOR) 40 MG tablet TAKE 1 TABLET DAILY 09/26/18   Lelon Perla, MD  budesonide-formoterol [facility]) 160-4.5 MCG/ACT inhaler Inhale 2 puffs into the lungs 2 (two) times daily. 02/21/19   Biagio Borg, MD  Cholecalciferol (VITAMIN D) 2000 UNITS tablet Take 2,000 Units by mouth at bedtime.    [provider]  clonazePAM (KLONOPIN) 0.5 MG tablet TAKE 1 TABLET BY MOUTH TWICE DAILY AS NEEDED FOR ANXIETY 11/10/18   Biagio Borg, MD  dextromethorphan-guaiFENesin [facility] DM) 30-600 MG 12hr tablet Take 1 tablet by mouth 2 (two) times daily as needed for cough.    [provider]  DULoxetine (CYMBALTA) 30 MG capsule TAKE 2 CAPSULES AT BEDTIME  11/20/18   Biagio Borg, MD  famotidine (PEPCID) 20 MG tablet Take 20 mg by mouth every morning.     [provider]  furosemide (LASIX) 40 MG tablet TAKE 1 AND 1/2 TABLETS BY MOUTH 2 TIMES DAILY Patient taking differently: 40 mg.  12/26/18   Lelon Perla, MD  levothyroxine (SYNTHROID) 200 MCG tablet Take 1 tablet (200 mcg total) by mouth daily. 03/08/19   Biagio Borg, MD  omeprazole (PRILOSEC) 40 MG capsule TAKE 1 CAPSULE DAILY 01/30/18   Biagio Borg, MD  OXYGEN 2lpm with CPAP at night and with exertion    [provider]  potassium chloride SA (K-DUR,KLOR-CON) 20 MEQ tablet TAKE 1 TABLET TWICE A DAY 03/15/17   Lelon Perla, MD  PROAIR HFA 108 [phone] Base) MCG/ACT inhaler Inhale 2 puffs into the lungs every 4 (four) hours as needed for wheezing or shortness of breath. 09/06/17   Tanda Rockers, MD  traMADol (ULTRAM) 50 MG tablet TAKE 1 TABLET BY MOUTH EVERY 8 HOURS IF NEEDED FOR PAIN 11/10/18   Biagio Borg, MD  vitamin C (ASCORBIC ACID) 500 MG tablet Take 500 mg by mouth daily.     [provider]    Physical Exam: Vitals:   05/03/19 1752 05/03/19 1803 05/03/19 1815 05/03/19 1845  BP: (!) 121/100 99/62 123/80 96/69  Pulse: 82 81    Resp: (!) 31 20 20 16   Temp:      TempSrc:      SpO2: 100% 100%      Constitutional:  . Appears calm and comfortable.  Obese. Eyes:  . Pallor. No jaundice.  ENMT:  . external ears, nose appear normal Neck:  . Neck is supple. No JVD Respiratory:  . CTA bilaterally, no w/r/r.  . Respiratory effort normal. No retractions or accessory muscle use Cardiovascular:  . S1S2 . Bilateral lower extremity edema 2+    Abdomen:  . Abdomen is obese, soft and non tender. Organs are difficult to assess. Neurologic:  . Awake and alert. . Moves all limbs.  Wt Readings from Last 3 Encounters:  01/03/19 117.9 kg  11/21/18 121.6 kg  10/19/18 127.9 kg    I have personally reviewed following labs and imaging studies  Labs on  Admission:  CBC: Recent Labs  Lab 05/03/19 1630  WBC 6.7  HGB 11.8*  HCT 40.6  MCV  96.7  PLT 607   Basic Metabolic Panel: Recent Labs  Lab 05/03/19 1630  NA 143  K 5.7*  CL 106  CO2 28  GLUCOSE 96  BUN 36*  CREATININE 1.96*  CALCIUM 9.5   Liver Function Tests: No results for input(s): AST, ALT, ALKPHOS, BILITOT, PROT, ALBUMIN in the last 168 hours. No results for input(s): LIPASE, AMYLASE in the last 168 hours. No results for input(s): AMMONIA in the last 168 hours. Coagulation Profile: No results for input(s): INR, PROTIME in the last 168 hours. Cardiac Enzymes: No results for input(s): CKTOTAL, CKMB, CKMBINDEX, TROPONINI in the last 168 hours. BNP (last 3 results) No results for input(s): PROBNP in the last 8760 hours. HbA1C: No results for input(s): HGBA1C in the last 72 hours. CBG: No results for input(s): GLUCAP in the last 168 hours. Lipid Profile: No results for input(s): CHOL, HDL, LDLCALC, TRIG, CHOLHDL, LDLDIRECT in the last 72 hours. Thyroid Function Tests: No results for input(s): TSH, T4TOTAL, FREET4, T3FREE, THYROIDAB in the last 72 hours. Anemia Panel: No results for input(s): VITAMINB12, FOLATE, FERRITIN, TIBC, IRON, RETICCTPCT in the last 72 hours. Urine analysis:    Component Value Date/Time   COLORURINE YELLOW (A) 09/10/2018 1410   APPEARANCEUR CLEAR (A) 09/10/2018 1410   LABSPEC 1.020 09/10/2018 1410   PHURINE 5.5 09/10/2018 1410   GLUCOSEU NEGATIVE 09/10/2018 1410   GLUCOSEU NEGATIVE 05/12/2017 0919   HGBUR LARGE (A) 09/10/2018 1410   BILIRUBINUR NEGATIVE 09/10/2018 1410   KETONESUR TRACE (A) 09/10/2018 1410   PROTEINUR TRACE (A) 09/10/2018 1410   UROBILINOGEN 0.2 05/12/2017 0919   NITRITE POSITIVE (A) 09/10/2018 1410   LEUKOCYTESUR SMALL (A) 09/10/2018 1410   Sepsis Labs: @LABRCNTIP (procalcitonin:4,lacticidven:4) )No results found for this or any previous visit (from the past 240 hour(s)).    Radiological Exams on Admission: Dg  Chest Port 1 View  Result Date: 05/03/2019 CLINICAL DATA:  AFib and shortness of breath for 3 days EXAM: PORTABLE CHEST 1 VIEW COMPARISON:  Radiograph 09/02/2016 FINDINGS: Dual lead pacer pack overlies the left chest wall with leads in the right atrium and cardiac apex in similar orientation of prior radiographs. The heart remains enlarged. Accounting for body habitus, the lungs are clear. No consolidation, convincing features of edema, pneumothorax, or effusion. Pulmonary vascularity is normally distributed. The cardiomediastinal contours are unremarkable. No acute osseous or soft tissue abnormality. Degenerative changes are present in the and imaged spine and shoulders. Cervical fusion hardware appears grossly intact. IMPRESSION: Stable cardiomegaly No acute cardiopulmonary abnormality. Electronically Signed   By: Lovena Le M.D.   On: 05/03/2019 16:08    EKG: Independently reviewed.   Active Problems:   * No active hospital problems. *   Assessment/Plan Hypotension: This is likely multifactorial. -Patient has had diarrhea, poor p.o. intake and was on diuretics. -I suspect patient has right heart failure impairment that would lead to worsening blood pressure in setting of ineffective circulatory arterial volume. -Patient was already hypotensive prior to cardioversion. We will hydrate patient cautiously. We will monitor blood pressure closely. We will hold diuretics We will hold antihypertensives I doubt infective etiology for the hypotension. Further management will depend on hospital course.  Suspect ineffective circulatory arterial volume: Check albumin Cautious hydration Optimization of volume can be challenging in the setting of right heart impairment.  Acute kidney injury: Suspect prerenal Check urine sodium Cautiously hydrate patient. Follow above work-up. May be challenging to optimize. Threshold to repeat echocardiogram  Diarrhea: Stool analysis Supportive care for  now.  A. fib with RVR: Possibly adrenergic driven (secondary to above) Patient is status post cardioversion. Patient has remained in normal sinus rhythm (hopefully, this will improve circulatory volume). Will defer to cardiology team.  Coronary artery disease: Stable. Continue to monitor closely.  COPD on home oxygen: No exacerbation. Manage expectantly.  DVT prophylaxis: Continue apixaban Code Status: Full code Family Communication: Daughter Disposition Plan: Home eventually Consults called: Cardiology team is already seen patient Admission status: Observation  Time spent: 65 minutes  Dana Allan, MD  [facility] Pager #: [phone] 7PM-7AM contact night coverage as above  05/03/2019, 7:08 PM

## 2019-05-03 NOTE — ED Triage Notes (Signed)
PT became [facility]  While on telephone with Heart monitor equipment . Pt was asked to ambulated while having heart monitor eval. Pt reports she was feeling more and more anxious during monitor eval. Pt HR 127 and Pt SHOB.

## 2019-05-03 NOTE — ED Provider Notes (Addendum)
[facility] Emergency Department Provider Note MRN:  [id number]  Arrival date & time: 05/03/19     Chief Complaint   Shortness of Breath   History of Present Illness   [name] [name] is a 73 y.o. year-old female with a history of atrial flutter presenting to the ED with chief complaint of shortness of breath.  Patient endorsing sudden onset of mild shortness of breath and palpitations yesterday afternoon.  It occurred while she was trying to figure out her cardiac monitoring system at home.  Symptoms have been constant mild since that time.  She denies headache or vision change, no chest pain, no abdominal pain, no numbness or weakness to the arms or legs.  Review of Systems  A complete 10 system review of systems was obtained and all systems are negative except as noted in the HPI and PMH.   Patient's Health History    Past Medical History:  Diagnosis Date  . Anemia   . Asthma 06/13/2011   hx of  . Atrial fibrillation ([place])   . Atrial flutter ([place])   . Breast cancer ([place]) dx'd 2005   No BP check or stick in Left arm  . CAD (coronary artery disease)    a. Nonobstructive CAD 2007 (EF 75%.  RCA  proximal 75% tubular, 25% prox LAD, 25% d1, 50% D2) at [facility]. b. NSTEMI 01/2010 in setting of respiratory failure, medical approach (not a good candidate for noninvasive eval)  . Cellulitis    hx of cellulitis in left leg  . Cervical radiculopathy 06/13/2011  . COPD (chronic obstructive pulmonary disease) ([place])    on O2  . Depression 06/13/2011  . Diverticulosis   . Esophageal dysmotility   . Gout 06/13/2011  . History of Clostridium difficile early dec 2015   no current diarrhea  . History of home oxygen therapy    2 liters per nasal cannula all the time  . Hx of dysfunctional uterine bleeding    last bleeding jan 2016, worked up for with d and c x 2 no cause found  . Hyperlipidemia   . Hypertension   . Hypothyroidism   . Impaired glucose tolerance 11/01/2012  .  Neuropathy 06/13/2011  . Obesity hypoventilation syndrome ([place]) 06/13/2011  . OSA on CPAP   . Personal history of chemotherapy   . Personal history of radiation therapy   . PFO (patent foramen ovale)   . Risk for falls   . Syncope and collapse    Bradycardia with pauses. S/P pacemaker  . Tubular adenoma of colon     Past Surgical History:  Procedure Laterality Date  . BREAST BIOPSY    . BREAST LUMPECTOMY Left 05/2004  . CARDIOVERSION N/A 03/15/2013   Procedure: CARDIOVERSION;  Surgeon: Thayer Headings, MD;  Location: [facility] ENDOSCOPY;  Service: Cardiovascular;  Laterality: N/A;  . CERVICAL BIOPSY  June 2013   at [facility] for Heavy  Bleeding  . [place] SURGERY  2004 or 2005   have cadaver bones,, screws, and plates c 5 to c 6  . COLONOSCOPY    . COLONOSCOPY WITH PROPOFOL N/A 10/22/2014   Procedure: COLONOSCOPY WITH PROPOFOL;  Surgeon: Jerene Bears, MD;  Location: [facility] ENDOSCOPY;  Service: Gastroenterology;  Laterality: N/A;  . DILATION AND CURETTAGE OF UTERUS    . LAPAROSCOPIC APPENDECTOMY  03/29/2012   Procedure: APPENDECTOMY LAPAROSCOPIC;  Surgeon: Adin Hector, MD;  Location: [facility] ORS;  Service: General;  Laterality: N/A;  . LOOP RECORDER EXPLANT  08/31/2013   Procedure: LOOP RECORDER EXPLANT;  Surgeon: Coralyn Mark, MD;  Location: [facility] CATH LAB;  Service: Cardiovascular;;  . LOOP RECORDER IMPLANT  08-28-2013; 08-31-2013   MDT LinQ implanted by Dr Rayann Heman for syncope; explanted 08-31-2013 after sinus pauses identified  . LOOP RECORDER IMPLANT N/A 08/28/2013   Procedure: LOOP RECORDER IMPLANT;  Surgeon: Coralyn Mark, MD;  Location: [place] CATH LAB;  Service: Cardiovascular;  Laterality: N/A;  . OVARIAN CYST REMOVAL    . PACEMAKER INSERTION  08-31-2013   Biotronik Evera dual chamber pacemaker placed for neurocardiogenic syncope and sinus pauses by Dr Rayann Heman  . PERMANENT PACEMAKER INSERTION N/A 08/31/2013   Procedure: PERMANENT PACEMAKER INSERTION;  Surgeon: Coralyn Mark, MD;  Location: [place]  CATH LAB;  Service: Cardiovascular;  Laterality: N/A;  . TEE WITHOUT CARDIOVERSION N/A 03/15/2013   Procedure: TRANSESOPHAGEAL ECHOCARDIOGRAM (TEE);  Surgeon: Thayer Headings, MD;  Location: [facility] ENDOSCOPY;  Service: Cardiovascular;  Laterality: N/A;    Family History  Problem Relation Age of Onset  . Uterine cancer Mother   . Heart disease Mother   . Breast cancer Sister 72  . Ovarian cancer Sister   . Breast cancer Cousin 35  . Diabetes Brother   . Breast cancer Maternal Aunt   . Ovarian cancer Sister     Social History   Socioeconomic History  . Marital status: Divorced    Spouse name: Not on file  . Number of children: 2  . Years of education: Not on file  . Highest education level: Not on file  Occupational History  . Occupation: Retired  Scientific laboratory technician  . Financial resource strain: Not on file  . Food insecurity    Worry: Not on file    Inability: Not on file  . Transportation needs    Medical: Not on file    Non-medical: Not on file  Tobacco Use  . Smoking status: Former Smoker    Packs/day: 1.00    Years: 20.00    Pack years: 20.00    Types: Cigarettes    Quit date: 09/20/1998    Years since quitting: 20.6  . Smokeless tobacco: Never Used  . Tobacco comment: 1ppd x 20 years  Substance and Sexual Activity  . Alcohol use: Yes    Alcohol/week: 1.0 standard drinks    Types: 1 Glasses of wine per week    Comment: rare  . Drug use: No  . Sexual activity: Not Currently    Birth control/protection: Post-menopausal  Lifestyle  . Physical activity    Days per week: Not on file    Minutes per session: Not on file  . Stress: Not on file  Relationships  . Social Herbalist on phone: Not on file    Gets together: Not on file    Attends religious service: Not on file    Active member of club or organization: Not on file    Attends meetings of clubs or organizations: Not on file    Relationship status: Not on file  . Intimate partner violence    Fear of  current or ex partner: Not on file    Emotionally abused: Not on file    Physically abused: Not on file    Forced sexual activity: Not on file  Other Topics Concern  . Not on file  Social History Narrative   Lives alone in an apartment on the first floor.   Has 2 children.  Retired Advertising copywriter.  Education:  some college.      Physical Exam  Vital Signs and Nursing Notes reviewed Vitals:   05/03/19 1752 05/03/19 1803  BP: (!) 121/100 99/62  Pulse: 82 81  Resp: (!) 31 20  Temp:    SpO2: 100% 100%    CONSTITUTIONAL: Well-appearing, NAD NEURO:  Alert and oriented x 3, no focal deficits EYES:  eyes equal and reactive ENT/NECK:  no LAD, no JVD CARDIO: Tachycardic rate, well-perfused, normal S1 and S2 PULM:  CTAB no wheezing or rhonchi GI/GU:  normal bowel sounds, non-distended, non-tender MSK/SPINE:  No gross deformities, no edema SKIN:  no rash, atraumatic PSYCH:  Appropriate speech and behavior  Diagnostic and Interventional Summary    EKG Interpretation  Date/Time:  Thursday May 03 2019 15:19:44 EDT Ventricular Rate:  126 PR Interval:    QRS Duration: 82 QT Interval:  332 QTC Calculation: 481 R Axis:   41 Text Interpretation:  Sinus or ectopic atrial tachycardia Prolonged PR interval Consider left atrial enlargement Low voltage, precordial leads RSR' in V1 or V2, right VCD or RVH ST depr, consider ischemia, inferior leads Confirmed by Gerlene Fee [phone]) on 05/03/2019 3:52:32 PM      Labs Reviewed  CBC - Abnormal; Notable for the following components:      Result Value   Hemoglobin 11.8 (*)    MCHC 29.1 (*)    RDW 17.4 (*)    nRBC 0.5 (*)    All other components within normal limits  BASIC METABOLIC PANEL - Abnormal; Notable for the following components:   Potassium 5.7 (*)    BUN 36 (*)    Creatinine, Ser 1.96 (*)    GFR calc non Af Amer 25 (*)    GFR calc Af Amer 29 (*)    All other components within normal limits  SARS CORONAVIRUS 2   URINALYSIS, ROUTINE W REFLEX MICROSCOPIC    DG Chest Port 1 View  Final Result      Medications  etomidate (AMIDATE) injection 10 mg (has no administration in time range)  sodium chloride 0.9 % bolus 1,000 mL (has no administration in time range)  insulin aspart (novoLOG) injection 5 Units (has no administration in time range)  dextrose 50 % solution 50 mL (has no administration in time range)     Ultrasound ED Peripheral IV (Provider)  Date/Time: 05/03/2019 4:52 PM Performed by: Maudie Flakes, MD Authorized by: Maudie Flakes, MD   Procedure details:    Indications: poor IV access     Skin Prep: chlorhexidine gluconate     Location:  Right AC   Angiocath:  20 G   Bedside Ultrasound Guided: Yes     Images: not archived     Patient tolerated procedure without complications: Yes     Dressing applied: Yes    .Sedation  Date/Time: 05/03/2019 6:09 PM Performed by: Maudie Flakes, MD Authorized by: Maudie Flakes, MD   Consent:    Consent obtained:  Verbal and written   Consent given by:  Patient   Risks discussed:  Allergic reaction, inadequate sedation, nausea, prolonged hypoxia resulting in organ damage and respiratory compromise necessitating ventilatory assistance and intubation Universal protocol:    Immediately prior to procedure a time out was called: yes     Patient identity confirmation method:  Arm band, hospital-assigned identification number, provided demographic data and verbally with patient Indications:    Procedure performed:  Cardioversion   Procedure necessitating sedation performed by:  Physician performing sedation Pre-sedation  assessment:    Time since last food or drink:  4 hours   ASA classification: class 2 - patient with mild systemic disease     Neck mobility: reduced     Mouth opening:  3 or more finger widths   Mallampati score:  I - soft palate, uvula, fauces, pillars visible   Pre-sedation assessments completed and reviewed: airway  patency, cardiovascular function, mental status and respiratory function   Immediate pre-procedure details:    Reassessment: Patient reassessed immediately prior to procedure     Reviewed: vital signs and relevant labs/tests     Verified: bag valve mask available, emergency equipment available, intubation equipment available, IV patency confirmed, oxygen available and suction available   Procedure details (see MAR for exact dosages):    Preoxygenation:  Nasal cannula   Sedation:  Etomidate   Intra-procedure monitoring:  Blood pressure monitoring, cardiac monitor, continuous capnometry, continuous pulse oximetry, frequent LOC assessments and frequent vital sign checks   Intra-procedure events comment:  Inadequate sedation due to IV infiltration   Intra-procedure management: Termination of cardioversion procedure.   Total Provider sedation time (minutes):  19 Comments:     During procedural timeout a procedural checklist was performed.  Patient's IV appeared to be functioning well with a bolus of IV fluids running and without issue.  After administration of etomidate however, evidence of infiltration was noted.  Patient did not sedate despite 20 mg etomidate, likely placed unintentionally in the soft tissues. .Cardioversion  Date/Time: 05/03/2019 6:14 PM Performed by: Maudie Flakes, MD Authorized by: Maudie Flakes, MD   Consent:    Consent obtained:  Verbal, written and emergent situation   Consent given by:  Patient   Risks discussed:  Pain, induced arrhythmia and death   Alternatives discussed:  Rate-control medication and observation Universal protocol:    Immediately prior to procedure a time out was called: yes     Patient identity confirmed:  Arm band, provided demographic data, hospital-assigned identification number and verbally with patient Pre-procedure details:    Cardioversion basis:  Emergent   Rhythm:  Atrial flutter   Electrode placement:  Anterior-posterior Patient  sedated: Yes. Refer to sedation procedure documentation for details of sedation.  Attempt one:    Cardioversion mode:  Synchronous   Waveform:  Biphasic   Shock (Joules):  120   Shock outcome:  Conversion to normal sinus rhythm Post-procedure details:    Patient status:  Awake   Patient tolerance of procedure:  Tolerated well, no immediate complications Comments:     Patient's sedation attempt was complicated by IV infiltration and unintentional subcutaneous administration of etomidate.  The cardioversion procedure, initially planned as elective, was terminated due to this.  About 20 minutes after administration of the subcutaneous etomidate, patient became acutely delirious, attempting to get out of bed, clearly altered.  She was exhibiting hypotension with systolics in the 56L and 87F.  This elective cardioversion became emergent, patient deemed unstable.  Her delirium likely multifactorial related to hypotension as well as drug effect of the etomidate.  She was cardioverted with successful conversion to sinus rhythm.     Critical Care Critical Care Documentation Critical care time provided by me (excluding procedures): 33 minutes  Condition necessitating critical care: Atrial flutter with rapid ventricular response  Components of critical care management: reviewing of prior records, laboratory and imaging interpretation, frequent re-examination and reassessment of vital signs, discussion with consulting services.    ED Course and Medical Decision Making  I have  reviewed the triage vital signs and the nursing notes.  Pertinent labs & imaging results that were available during my care of the patient were reviewed by me and considered in my medical decision making (see below for details).  Sudden onset mild shortness of breath and palpitations in this 73 year old female presenting with a flutter with a rapid ventricular response on her EKG.  Has required 1 cardioversion in the past  according the patient.  Patient is adamant that she has been taking her anticoagulation medication as directed without missing any doses.  She explains that she stopped her metoprolol a few weeks ago, likely contributing to this elevated rate.  Will discuss with cardiology, likely a good candidate for cardioversion.  Spoke with Dr. Marlou Porch of cardiology, who agrees with plan for cardioversion.  6 PM update: Cardioversion performed as described above with noted hardships but in general no significant complications.  Patient will be observed here in the emergency department for 2 hours to ensure no further drug effects of the infiltrated dose of etomidate.  Awaiting laboratory evaluation.  Labs reveal significant AKI with hyperkalemia.  Will provide insulin, D50, IV fluids.  Patient's EKG after cardioversion reveals sinus rhythm without T wave abnormalities.  To be admitted to hospital service.  Barth Kirks. Sedonia Small, [place] [email or web address]  Final Clinical Impressions(s) / ED Diagnoses     ICD-10-CM   1. Atrial flutter with rapid ventricular response (HCC)  I48.92   2. SOB (shortness of breath)  R06.02 DG Chest [facility] 1 View    DG Chest [place] 1 View  3. AKI (acute kidney injury) ([place])  N17.9   4. Hyperkalemia  E87.5   5. Hypotension, unspecified hypotension type  I95.9     ED Discharge Orders    None         Maudie Flakes, MD 05/03/19 1821    Maudie Flakes, MD 05/03/19 1840

## 2019-05-03 NOTE — Progress Notes (Signed)
Patient no longer needs PIV start MD placed IV via ultrasound

## 2019-05-03 NOTE — ED Notes (Signed)
ED TO INPATIENT HANDOFF REPORT  ED Nurse Name and Phone #:  Chester Holstein [phone]  S Name/Age/Gender [name] [name] 73 y.o. female Room/Bed: 041C/041C  Code Status   Code Status: Prior  Home/SNF/Other Home Patient oriented to: self, place, time and situation Is this baseline? Yes   Triage Complete: Triage complete  Chief Complaint sob  Triage Note PT became [facility]  While on telephone with Heart monitor equipment . Pt was asked to ambulated while having heart monitor eval. Pt reports she was feeling more and more anxious during monitor eval. Pt HR 127 and Pt SHOB.  PT 's IV infiltrated  Unable to do Conversion . DR Sedonia Small to to start  Second  IV site.  PT A/O and speaking in full sentences. Pt received  A total of 20 mg Etomidate with no sedation that occurred.  Sedation stopped . Plan to restart  When IV is available . EDP Bero at bed side  2nd IV started by Lennette Bihari ,RN  BP higher 116/80 , HR 124. Pt A/O x 4 . Family in room.   Allergies Allergies  Allergen Reactions  . Ciprofloxacin Other (See Comments)    foliculitis  . Cephalexin Other (See Comments)    Exfoliative dermatitis reaction  . Codeine     REACTION: unknown - pt. states unaware of having reaction to codeine  . Dilaudid [Hydromorphone] Other (See Comments)    Other Reaction: Oversedation  . Lisinopril     REACTION: cough  . Penicillins     rash  . Cleocin [Clindamycin Hcl] Rash    rash  . Doxycycline Rash    Level of Care/Admitting Diagnosis ED Disposition    ED Disposition Condition [facility] Area: [place] [[id number]]  Level of Care: Telemetry Cardiac [103]  I expect the patient will be discharged within 24 hours: No (not a candidate for 5C-Observation unit)  Covid Evaluation: Asymptomatic Screening Protocol (No Symptoms)  Diagnosis: Hypotension [710626]  Admitting Physician: Bonnell Public [[id number]]  Attending Physician: Dana Allan I [[id number]]  PT Class (Do Not  Modify): Observation [104]  PT Acc Code (Do Not Modify): Observation [10022]       B Medical/Surgery History Past Medical History:  Diagnosis Date  . Anemia   . Asthma 06/13/2011   hx of  . Atrial fibrillation ([place])   . Atrial flutter ([place])   . Breast cancer ([place]) dx'd 2005   No BP check or stick in Left arm  . CAD (coronary artery disease)    a. Nonobstructive CAD 2007 (EF 75%.  RCA  proximal 75% tubular, 25% prox LAD, 25% d1, 50% D2) at [facility]. b. NSTEMI 01/2010 in setting of respiratory failure, medical approach (not a good candidate for noninvasive eval)  . Cellulitis    hx of cellulitis in left leg  . Cervical radiculopathy 06/13/2011  . COPD (chronic obstructive pulmonary disease) ([place])    on O2  . Depression 06/13/2011  . Diverticulosis   . Esophageal dysmotility   . Gout 06/13/2011  . History of Clostridium difficile early dec 2015   no current diarrhea  . History of home oxygen therapy    2 liters per nasal cannula all the time  . Hx of dysfunctional uterine bleeding    last bleeding jan 2016, worked up for with d and c x 2 no cause found  . Hyperlipidemia   . Hypertension   . Hypothyroidism   . Impaired glucose tolerance 11/01/2012  .  Neuropathy 06/13/2011  . Obesity hypoventilation syndrome ([place]) 06/13/2011  . OSA on CPAP   . Personal history of chemotherapy   . Personal history of radiation therapy   . PFO (patent foramen ovale)   . Risk for falls   . Syncope and collapse    Bradycardia with pauses. S/P pacemaker  . Tubular adenoma of colon    Past Surgical History:  Procedure Laterality Date  . BREAST BIOPSY    . BREAST LUMPECTOMY Left 05/2004  . CARDIOVERSION N/A 03/15/2013   Procedure: CARDIOVERSION;  Surgeon: Thayer Headings, MD;  Location: [facility] ENDOSCOPY;  Service: Cardiovascular;  Laterality: N/A;  . CERVICAL BIOPSY  June 2013   at [facility] for Heavy  Bleeding  . [place] SURGERY  2004 or 2005   have cadaver bones,, screws, and plates c 5 to c 6  .  COLONOSCOPY    . COLONOSCOPY WITH PROPOFOL N/A 10/22/2014   Procedure: COLONOSCOPY WITH PROPOFOL;  Surgeon: Jerene Bears, MD;  Location: [facility] ENDOSCOPY;  Service: Gastroenterology;  Laterality: N/A;  . DILATION AND CURETTAGE OF UTERUS    . LAPAROSCOPIC APPENDECTOMY  03/29/2012   Procedure: APPENDECTOMY LAPAROSCOPIC;  Surgeon: Adin Hector, MD;  Location: [facility] ORS;  Service: General;  Laterality: N/A;  . LOOP RECORDER EXPLANT  08/31/2013   Procedure: LOOP RECORDER EXPLANT;  Surgeon: Coralyn Mark, MD;  Location: [facility] CATH LAB;  Service: Cardiovascular;;  . LOOP RECORDER IMPLANT  08-28-2013; 08-31-2013   MDT LinQ implanted by Dr Rayann Heman for syncope; explanted 08-31-2013 after sinus pauses identified  . LOOP RECORDER IMPLANT N/A 08/28/2013   Procedure: LOOP RECORDER IMPLANT;  Surgeon: Coralyn Mark, MD;  Location: [facility] CATH LAB;  Service: Cardiovascular;  Laterality: N/A;  . OVARIAN CYST REMOVAL    . PACEMAKER INSERTION  08-31-2013   Biotronik Evera dual chamber pacemaker placed for neurocardiogenic syncope and sinus pauses by Dr Rayann Heman  . PERMANENT PACEMAKER INSERTION N/A 08/31/2013   Procedure: PERMANENT PACEMAKER INSERTION;  Surgeon: Coralyn Mark, MD;  Location: [facility] CATH LAB;  Service: Cardiovascular;  Laterality: N/A;  . TEE WITHOUT CARDIOVERSION N/A 03/15/2013   Procedure: TRANSESOPHAGEAL ECHOCARDIOGRAM (TEE);  Surgeon: Thayer Headings, MD;  Location: [facility] ENDOSCOPY;  Service: Cardiovascular;  Laterality: N/A;     A IV Location/Drains/Wounds Patient Lines/Drains/Airways Status   Active Line/Drains/Airways    Name:   Placement date:   Placement time:   Site:   Days:   Peripheral IV 05/03/19 Right;Upper Arm   05/03/19    1659    Arm   less than 1   Peripheral IV 05/03/19 Right Forearm   05/03/19    1729    Forearm   less than 1          Intake/Output Last 24 hours No intake or output data in the 24 hours ending 05/03/19 2034  Labs/Imaging Results for orders placed or performed during the  hospital encounter of 05/03/19 (from the past 48 hour(s))  CBC     Status: Abnormal   Collection Time: 05/03/19  4:30 PM  Result Value Ref Range   WBC 6.7 4.0 - 10.5 K/uL   RBC 4.20 3.87 - 5.11 MIL/uL   Hemoglobin 11.8 (L) 12.0 - 15.0 g/dL   HCT 40.6 36.0 - 46.0 %   MCV 96.7 80.0 - 100.0 fL   MCH 28.1 26.0 - 34.0 pg   MCHC 29.1 (L) 30.0 - 36.0 g/dL   RDW 17.4 (H) 11.5 - 15.5 %  Platelets 178 150 - 400 K/uL   nRBC 0.5 (H) 0.0 - 0.2 %    Comment: Performed at [facility], [place] [address], [place], [place] [address]  Basic metabolic panel     Status: Abnormal   Collection Time: 05/03/19  4:30 PM  Result Value Ref Range   Sodium 143 135 - 145 mmol/L   Potassium 5.7 (H) 3.5 - 5.1 mmol/L   Chloride 106 98 - 111 mmol/L   CO2 28 22 - 32 mmol/L   Glucose, Bld 96 70 - 99 mg/dL   BUN 36 (H) 8 - 23 mg/dL   Creatinine, Ser 1.96 (H) 0.44 - 1.00 mg/dL   Calcium 9.5 8.9 - 10.3 mg/dL   GFR calc non Af Amer 25 (L) >60 mL/min   GFR calc Af Amer 29 (L) >60 mL/min   Anion gap 9 5 - 15    Comment: Performed at [facility] [address] [place], [place] [address]   Dg Chest Port 1 View  Result Date: 05/03/2019 CLINICAL DATA:  AFib and shortness of breath for 3 days EXAM: PORTABLE CHEST 1 VIEW COMPARISON:  Radiograph 09/02/2016 FINDINGS: Dual lead pacer pack overlies the left chest wall with leads in the right atrium and cardiac apex in similar orientation of prior radiographs. The heart remains enlarged. Accounting for body habitus, the lungs are clear. No consolidation, convincing features of edema, pneumothorax, or effusion. Pulmonary vascularity is normally distributed. The cardiomediastinal contours are unremarkable. No acute osseous or soft tissue abnormality. Degenerative changes are present in the and imaged spine and shoulders. Cervical fusion hardware appears grossly intact. IMPRESSION: Stable cardiomegaly No acute cardiopulmonary abnormality. Electronically Signed   By: Lovena Le M.D.   On: 05/03/2019 16:08    Pending Labs Unresulted Labs (From admission, onward)    Start     Ordered   05/03/19 1929  SARS Coronavirus 2 [facility] order, Performed in [facility] hospital lab)  Once,   R     05/03/19 1929   05/03/19 1831  Urinalysis, Routine w reflex microscopic  ONCE - STAT,   STAT     05/03/19 1831   Signed and Held  Hemoglobin A1c  Once,   R    Comments: To assess prior glycemic control    Signed and Held   Signed and Held  Magnesium  Once,   R     Signed and Held   Signed and Held  Phosphorus  Once,   R     Signed and Held   Signed and Held  TSH  Once,   R     Signed and Occupational psychologist and Held  Urinalysis, Complete w Microscopic  Once,   R     Signed and Held   Visual merchandiser and Occupational hygienist morning,   R     Signed and Held   Visual merchandiser and Held  CBC  Tomorrow morning,   R     Signed and Held   Visual merchandiser and Held  Lactoferrin, Fecal,Qualitative  Once,   R     Signed and Held   Signed and Held  OVA + PARASITE EXAM  Once,   R     Signed and Held   Signed and Held  Sodium, urine, random  Once,   R     Signed and Held          Vitals/Pain Today's Vitals   05/03/19 1752  05/03/19 1803 05/03/19 1815 05/03/19 1845  BP: (!) 121/100 99/62 123/80 96/69  Pulse: 82 81    Resp: (!) 31 20 20 16   Temp:      TempSrc:      SpO2: 100% 100%    PainSc: 0-No pain       Isolation Precautions No active isolations  Medications Medications  etomidate (AMIDATE) injection 10 mg (has no administration in time range)  sodium chloride 0.9 % bolus 1,000 mL (has no administration in time range)  insulin aspart (novoLOG) injection 5 Units (has no administration in time range)  dextrose 50 % solution 50 mL (has no administration in time range)  sodium zirconium cyclosilicate (LOKELMA) packet 10 g (10 g Oral Given 05/03/19 2006)    Mobility walks with device High fall risk   Focused Assessments Pulmonary Assessment Handoff:  Lung sounds: L  Breath Sounds: Diminished R Breath Sounds: Diminished O2 Device: Nasal Cannula O2 Flow Rate (L/min): 2 L/min      R Recommendations: See Admitting Provider Note  Report given to:   Additional Notes:  Awaiting COVID Results.

## 2019-05-03 NOTE — ED Triage Notes (Signed)
Sedation stopped . Plan to restart  When IV is available . EDP Bero at bed side

## 2019-05-03 NOTE — ED Provider Notes (Signed)
Received sign out from Dr. Sedonia Small.  Pt with long hx of aflutter/afib who developed aflutter with HR 130 since yesterday, she came in with sob.  Pt was cardioverted in the ER.  She was found to be hyperkalemic with K+ 5.7, was given treatment.  She also has evidence of AKI with Cr. 1.96, likely 2/2 recurrent diarrhea.  She is receiving IVF and will benefit from hospital admission for obs.  7:03 PM Appreciate consultation from [facility] who agrees to admit pt for obs.  He also recommend giving pt Lokelma for hyperkalemia.  Will order.    BP 96/69   Pulse 81   Temp 98.8 F (37.1 C) (Oral)   Resp 16   SpO2 100%   Results for orders placed or performed during the hospital encounter of 05/03/19  CBC  Result Value Ref Range   WBC 6.7 4.0 - 10.5 K/uL   RBC 4.20 3.87 - 5.11 MIL/uL   Hemoglobin 11.8 (L) 12.0 - 15.0 g/dL   HCT 40.6 36.0 - 46.0 %   MCV 96.7 80.0 - 100.0 fL   MCH 28.1 26.0 - 34.0 pg   MCHC 29.1 (L) 30.0 - 36.0 g/dL   RDW 17.4 (H) 11.5 - 15.5 %   Platelets 178 150 - 400 K/uL   nRBC 0.5 (H) 0.0 - 0.2 %  Basic metabolic panel  Result Value Ref Range   Sodium 143 135 - 145 mmol/L   Potassium 5.7 (H) 3.5 - 5.1 mmol/L   Chloride 106 98 - 111 mmol/L   CO2 28 22 - 32 mmol/L   Glucose, Bld 96 70 - 99 mg/dL   BUN 36 (H) 8 - 23 mg/dL   Creatinine, Ser 1.96 (H) 0.44 - 1.00 mg/dL   Calcium 9.5 8.9 - 10.3 mg/dL   GFR calc non Af Amer 25 (L) >60 mL/min   GFR calc Af Amer 29 (L) >60 mL/min   Anion gap 9 5 - 15   Dg Chest Port 1 View  Result Date: 05/03/2019 CLINICAL DATA:  AFib and shortness of breath for 3 days EXAM: PORTABLE CHEST 1 VIEW COMPARISON:  Radiograph 09/02/2016 FINDINGS: Dual lead pacer pack overlies the left chest wall with leads in the right atrium and cardiac apex in similar orientation of prior radiographs. The heart remains enlarged. Accounting for body habitus, the lungs are clear. No consolidation, convincing features of edema, pneumothorax, or effusion.  Pulmonary vascularity is normally distributed. The cardiomediastinal contours are unremarkable. No acute osseous or soft tissue abnormality. Degenerative changes are present in the and imaged spine and shoulders. Cervical fusion hardware appears grossly intact. IMPRESSION: Stable cardiomegaly No acute cardiopulmonary abnormality. Electronically Signed   By: Lovena Le M.D.   On: 05/03/2019 16:08      Domenic Moras, PA-C 05/03/19 1904    Maudie Flakes, MD 05/05/19 [id number]

## 2019-05-03 NOTE — ED Triage Notes (Signed)
2nd IV started by Lennette Bihari ,RN  BP higher 116/80 , HR 124. Pt A/O x 4 . Family in room.

## 2019-05-04 DIAGNOSIS — I480 Paroxysmal atrial fibrillation: Secondary | ICD-10-CM | POA: Diagnosis present

## 2019-05-04 DIAGNOSIS — Z8249 Family history of ischemic heart disease and other diseases of the circulatory system: Secondary | ICD-10-CM | POA: Diagnosis not present

## 2019-05-04 DIAGNOSIS — I110 Hypertensive heart disease with heart failure: Secondary | ICD-10-CM | POA: Diagnosis present

## 2019-05-04 DIAGNOSIS — Z8049 Family history of malignant neoplasm of other genital organs: Secondary | ICD-10-CM | POA: Diagnosis not present

## 2019-05-04 DIAGNOSIS — I2510 Atherosclerotic heart disease of native coronary artery without angina pectoris: Secondary | ICD-10-CM | POA: Diagnosis present

## 2019-05-04 DIAGNOSIS — E875 Hyperkalemia: Secondary | ICD-10-CM

## 2019-05-04 DIAGNOSIS — Z803 Family history of malignant neoplasm of breast: Secondary | ICD-10-CM | POA: Diagnosis not present

## 2019-05-04 DIAGNOSIS — J449 Chronic obstructive pulmonary disease, unspecified: Secondary | ICD-10-CM | POA: Diagnosis present

## 2019-05-04 DIAGNOSIS — I959 Hypotension, unspecified: Secondary | ICD-10-CM | POA: Diagnosis present

## 2019-05-04 DIAGNOSIS — I4892 Unspecified atrial flutter: Secondary | ICD-10-CM | POA: Diagnosis present

## 2019-05-04 DIAGNOSIS — Z87891 Personal history of nicotine dependence: Secondary | ICD-10-CM | POA: Diagnosis not present

## 2019-05-04 DIAGNOSIS — M109 Gout, unspecified: Secondary | ICD-10-CM | POA: Diagnosis present

## 2019-05-04 DIAGNOSIS — E039 Hypothyroidism, unspecified: Secondary | ICD-10-CM | POA: Diagnosis present

## 2019-05-04 DIAGNOSIS — N179 Acute kidney failure, unspecified: Secondary | ICD-10-CM | POA: Diagnosis present

## 2019-05-04 DIAGNOSIS — Z20828 Contact with and (suspected) exposure to other viral communicable diseases: Secondary | ICD-10-CM | POA: Diagnosis present

## 2019-05-04 DIAGNOSIS — Z79891 Long term (current) use of opiate analgesic: Secondary | ICD-10-CM | POA: Diagnosis not present

## 2019-05-04 DIAGNOSIS — E662 Morbid (severe) obesity with alveolar hypoventilation: Secondary | ICD-10-CM | POA: Diagnosis present

## 2019-05-04 DIAGNOSIS — Z79890 Hormone replacement therapy (postmenopausal): Secondary | ICD-10-CM | POA: Diagnosis not present

## 2019-05-04 DIAGNOSIS — M5412 Radiculopathy, cervical region: Secondary | ICD-10-CM | POA: Diagnosis present

## 2019-05-04 DIAGNOSIS — Z7901 Long term (current) use of anticoagulants: Secondary | ICD-10-CM | POA: Diagnosis not present

## 2019-05-04 DIAGNOSIS — Z6841 Body Mass Index (BMI) 40.0 and over, adult: Secondary | ICD-10-CM | POA: Diagnosis not present

## 2019-05-04 DIAGNOSIS — Z9981 Dependence on supplemental oxygen: Secondary | ICD-10-CM | POA: Diagnosis not present

## 2019-05-04 DIAGNOSIS — E860 Dehydration: Secondary | ICD-10-CM | POA: Diagnosis present

## 2019-05-04 DIAGNOSIS — E785 Hyperlipidemia, unspecified: Secondary | ICD-10-CM | POA: Diagnosis present

## 2019-05-04 LAB — URINALYSIS, COMPLETE (UACMP) WITH MICROSCOPIC
Bilirubin Urine: NEGATIVE
Glucose, UA: NEGATIVE mg/dL
Hgb urine dipstick: NEGATIVE
Ketones, ur: NEGATIVE mg/dL
Leukocytes,Ua: NEGATIVE
Nitrite: NEGATIVE
Protein, ur: 100 mg/dL — AB
Specific Gravity, Urine: 1.015 (ref 1.005–1.030)
pH: 5 (ref 5.0–8.0)

## 2019-05-04 LAB — CBC
HCT: 35.6 % — ABNORMAL LOW (ref 36.0–46.0)
Hemoglobin: 10.9 g/dL — ABNORMAL LOW (ref 12.0–15.0)
MCH: 28.6 pg (ref 26.0–34.0)
MCHC: 30.6 g/dL (ref 30.0–36.0)
MCV: 93.4 fL (ref 80.0–100.0)
Platelets: 151 10*3/uL (ref 150–400)
RBC: 3.81 MIL/uL — ABNORMAL LOW (ref 3.87–5.11)
RDW: 17.3 % — ABNORMAL HIGH (ref 11.5–15.5)
WBC: 8.4 10*3/uL (ref 4.0–10.5)
nRBC: 0.2 % (ref 0.0–0.2)

## 2019-05-04 LAB — ALBUMIN: Albumin: 3.7 g/dL (ref 3.5–5.0)

## 2019-05-04 LAB — BASIC METABOLIC PANEL
Anion gap: 13 (ref 5–15)
BUN: 43 mg/dL — ABNORMAL HIGH (ref 8–23)
CO2: 22 mmol/L (ref 22–32)
Calcium: 9 mg/dL (ref 8.9–10.3)
Chloride: 106 mmol/L (ref 98–111)
Creatinine, Ser: 2.22 mg/dL — ABNORMAL HIGH (ref 0.44–1.00)
GFR calc Af Amer: 25 mL/min — ABNORMAL LOW (ref >60)
GFR calc non Af Amer: 21 mL/min — ABNORMAL LOW (ref >60)
Glucose, Bld: 91 mg/dL (ref 70–99)
Potassium: 5.9 mmol/L — ABNORMAL HIGH (ref 3.5–5.1)
Sodium: 141 mmol/L (ref 135–145)

## 2019-05-04 LAB — SODIUM, URINE, RANDOM: Sodium, Ur: 11 mmol/L

## 2019-05-04 LAB — GLUCOSE, CAPILLARY
Glucose-Capillary: 83 mg/dL (ref 70–99)
Glucose-Capillary: 91 mg/dL (ref 70–99)
Glucose-Capillary: 100 mg/dL — ABNORMAL HIGH (ref 70–99)
Glucose-Capillary: 108 mg/dL — ABNORMAL HIGH (ref 70–99)

## 2019-05-04 MED ORDER — SODIUM ZIRCONIUM CYCLOSILICATE 10 G PO PACK
10.0000 g | PACK | Freq: Once | ORAL | Status: AC
  Administered 2019-05-04: 10 g via ORAL
  Filled 2019-05-04: qty 1

## 2019-05-04 MED ORDER — SODIUM CHLORIDE 0.9 % IV SOLN
INTRAVENOUS | Status: AC
  Administered 2019-05-04 – 2019-05-05 (×2): via INTRAVENOUS

## 2019-05-04 MED ORDER — ALBUTEROL SULFATE (2.5 MG/3ML) 0.083% IN NEBU: 3.0000 mL | INHALATION_SOLUTION | RESPIRATORY_TRACT | Status: DC | PRN

## 2019-05-04 MED ORDER — ATORVASTATIN CALCIUM 40 MG PO TABS
40.0000 mg | ORAL_TABLET | Freq: Every day | ORAL | Status: DC
  Administered 2019-05-04 – 2019-05-05 (×2): 40 mg via ORAL
  Filled 2019-05-04 (×2): qty 1

## 2019-05-04 MED ORDER — DULOXETINE HCL 60 MG PO CPEP
60.0000 mg | ORAL_CAPSULE | Freq: Every day | ORAL | Status: DC
  Administered 2019-05-04: 60 mg via ORAL
  Filled 2019-05-04: qty 1

## 2019-05-04 MED ORDER — ALLOPURINOL 300 MG PO TABS
300.0000 mg | ORAL_TABLET | Freq: Every day | ORAL | Status: DC
  Administered 2019-05-04 – 2019-05-05 (×2): 300 mg via ORAL
  Filled 2019-05-04 (×2): qty 1

## 2019-05-04 MED ORDER — MOMETASONE FURO-FORMOTEROL FUM 200-5 MCG/ACT IN AERO
2.0000 | INHALATION_SPRAY | Freq: Two times a day (BID) | RESPIRATORY_TRACT | Status: DC
  Administered 2019-05-04 – 2019-05-05 (×3): 2 via RESPIRATORY_TRACT
  Filled 2019-05-04: qty 8.8

## 2019-05-04 MED ORDER — AMITRIPTYLINE HCL 50 MG PO TABS
100.0000 mg | ORAL_TABLET | Freq: Every day | ORAL | Status: DC
  Administered 2019-05-04: 100 mg via ORAL
  Filled 2019-05-04: qty 2
  Filled 2019-05-04: qty 1
  Filled 2019-05-04: qty 2

## 2019-05-04 NOTE — Progress Notes (Signed)
Pt's daughter at bedside. Plan of care reviewed with daughter per pr request.

## 2019-05-04 NOTE — Progress Notes (Signed)
Patient ambulated in the room with nurse tech.  Oxygen saturation at room air while ambulating at 90%. Pt stated that she has oxygen at home.

## 2019-05-04 NOTE — Progress Notes (Signed)
Patient refused to walk to check her oxygen saturation with ambulation at this moment. Pt verbalize understanding of importance.Will try again.

## 2019-05-04 NOTE — Progress Notes (Signed)
Patient self manages machine.  I did check her water level in the machine, placed water in machine for patient and made sure it was hooked up to O2 at 2L.  Patient uses O2 at home as well.  Patient stated that machine seemed a little more noisy than hers at home.  Noticed that she was loosing air through the tubing.  Will take patient new tubing.  Will continue to monitor patient.

## 2019-05-04 NOTE — Progress Notes (Signed)
   05/04/19 1700  Orthostatic Lying   BP- Lying 104/81  Pulse- Lying 91  Orthostatic Sitting  BP- Sitting 106/67  Pulse- Sitting 94  Orthostatic Standing at 0 minutes  BP- Standing at 0 minutes 102/69  Pulse- Standing at 0 minutes 82  Orthostatic Standing at 3 minutes  BP- Standing at 3 minutes  (couldn't stand for 3 mins)

## 2019-05-04 NOTE — Progress Notes (Signed)
Progress Note    [name] [name]  JAS:[id number] DOB: Jan 07, 1946  DOA: 05/03/2019 PCP: Biagio Borg, MD    Brief Narrative:     Medical records reviewed and are as summarized below:  Pt with long hx of aflutter/afib who developed aflutter with HR 130 since yesterday, she came in with sob.  Pt was cardioverted in the ER.  She was found to be hyperkalemic with K+ 5.7, was given treatment.  She also has evidence of AKI with Cr. 1.96, likely 2/2 recurrent diarrhea.  Assessment/Plan:   Active Problems:   Hypotension  Hypotension: This is likely multifactorial. -Patient has had diarrhea, poor p.o. intake and was on diuretics with a fib with RVR. We will monitor blood pressure closely.  Acute kidney injury: Suspect prerenal IVF -strict I/Os -U/A unremarkable -Urine Na: 11-- most likely dehydration  Hyperkalemia -d/c'd PO replacement -IVF -BMP in AM  Diarrhea: - appears resolved-- patient stated none further since hospitalization  A. fib with RVR: -status post cardioversion in ER  Coronary artery disease: Stable. Continue to monitor closely.  COPD on home oxygen: -wears 2L at home  obesity Body mass index is 43.55 kg/m.  Hypothyroid -TSH normal -continue home med  OSA -CPAP QHS  Family Communication/Anticipated D/C date and plan/Code Status   DVT prophylaxis: eliquis Code Status: Full Code.  Family Communication:  Disposition Plan: home in AM     Subjective:   Traumatized about cardioversion as she says she did not get any sedation  Objective:    Vitals:   05/04/19 0201 05/04/19 0536 05/04/19 0828 05/04/19 1153  BP:  (!) 106/57  116/74  Pulse:  77  91  Resp:  20  20  Temp:  (!) 97.3 F (36.3 C)  (!) 97.3 F (36.3 C)  TempSrc:  Oral  Oral  SpO2:  100% 99% 99%  Weight: 122.4 kg     Height:        Intake/Output Summary (Last 24 hours) at 05/04/2019 1335 Last data filed at 05/04/2019 1240 Gross per 24 hour  Intake 1091.33 ml    Output 450 ml  Net 641.33 ml   Filed Weights   05/03/19 2130 05/04/19 0201  Weight: 121.7 kg 122.4 kg    Exam: In chair, on O2 No increased work of breathing rrr A+Ox3   Data Reviewed:   I have personally reviewed following labs and imaging studies:  Labs: Labs show the following:   Basic Metabolic Panel: Recent Labs  Lab 05/03/19 1630 05/03/19 2217 05/04/19 0147  NA 143  --  141  K 5.7*  --  5.9*  CL 106  --  106  CO2 28  --  22  GLUCOSE 96  --  91  BUN 36*  --  43*  CREATININE 1.96*  --  2.22*  CALCIUM 9.5  --  9.0  MG  --  2.4  --   PHOS  --  6.5*  --    GFR Estimated Creatinine Clearance: 30.6 mL/min (A) (by C-G formula based on SCr of 2.22 mg/dL (H)). Liver Function Tests: Recent Labs  Lab 05/04/19 0147  ALBUMIN 3.7   No results for input(s): LIPASE, AMYLASE in the last 168 hours. No results for input(s): AMMONIA in the last 168 hours. Coagulation profile No results for input(s): INR, PROTIME in the last 168 hours.  CBC: Recent Labs  Lab 05/03/19 1630 05/04/19 0147  WBC 6.7 8.4  HGB 11.8* 10.9*  HCT 40.6 35.6*  MCV  96.7 93.4  PLT 178 151   Cardiac Enzymes: No results for input(s): CKTOTAL, CKMB, CKMBINDEX, TROPONINI in the last 168 hours. BNP (last 3 results) No results for input(s): PROBNP in the last 8760 hours. CBG: Recent Labs  Lab 05/03/19 11-16-47 05/04/19 0623 05/04/19 1149  GLUCAP 82 83 91   D-Dimer: No results for input(s): DDIMER in the last 72 hours. Hgb A1c: Recent Labs    05/03/19 2217  HGBA1C 5.2   Lipid Profile: No results for input(s): CHOL, HDL, LDLCALC, TRIG, CHOLHDL, LDLDIRECT in the last 72 hours. Thyroid function studies: Recent Labs    05/03/19 November 16, 2215  TSH 3.536   Anemia work up: No results for input(s): VITAMINB12, FOLATE, FERRITIN, TIBC, IRON, RETICCTPCT in the last 72 hours. Sepsis Labs: Recent Labs  Lab 05/03/19 1630 05/04/19 0147  WBC 6.7 8.4    Microbiology Recent Results (from the past  240 hour(s))  SARS Coronavirus 2 [facility] order, Performed in [place] hospital lab)     Status: None   Collection Time: 05/03/19  7:29 PM  Result Value Ref Range Status   SARS Coronavirus 2 NEGATIVE NEGATIVE Final    Comment: (NOTE) If result is NEGATIVE SARS-CoV-2 target nucleic acids are NOT DETECTED. The SARS-CoV-2 RNA is generally detectable in upper and lower  respiratory specimens during the acute phase of infection. The lowest  concentration of SARS-CoV-2 viral copies this assay can detect is 250  copies / mL. A negative result does not preclude SARS-CoV-2 infection  and should not be used as the sole basis for treatment or other  patient management decisions.  A negative result may occur with  improper specimen collection / handling, submission of specimen other  than nasopharyngeal swab, presence of viral mutation(s) within the  areas targeted by this assay, and inadequate number of viral copies  (<250 copies / mL). A negative result must be combined with clinical  observations, patient history, and epidemiological information. If result is POSITIVE SARS-CoV-2 target nucleic acids are DETECTED. The SARS-CoV-2 RNA is generally detectable in upper and lower  respiratory specimens dur ing the acute phase of infection.  Positive  results are indicative of active infection with SARS-CoV-2.  Clinical  correlation with patient history and other diagnostic information is  necessary to determine patient infection status.  Positive results do  not rule out bacterial infection or co-infection with other viruses. If result is PRESUMPTIVE POSTIVE SARS-CoV-2 nucleic acids MAY BE PRESENT.   A presumptive positive result was obtained on the submitted specimen  and confirmed on repeat testing.  While 11-15-17 novel coronavirus  (SARS-CoV-2) nucleic acids may be present in the submitted sample  additional confirmatory testing may be necessary for epidemiological  and / or clinical  management purposes  to differentiate between  SARS-CoV-2 and other Sarbecovirus currently known to infect humans.  If clinically indicated additional testing with an alternate test  methodology [phone]) is advised. The SARS-CoV-2 RNA is generally  detectable in upper and lower respiratory sp ecimens during the acute  phase of infection. The expected result is Negative. Fact Sheet for Patients:  StrictlyIdeas.no Fact Sheet for Healthcare Providers: BankingDealers.co.za This test is not yet approved or cleared by the [place] FDA and has been authorized for detection and/or diagnosis of SARS-CoV-2 by FDA under an Emergency Use Authorization (EUA).  This EUA will remain in effect (meaning this test can be used) for the duration of the COVID-19 declaration under Section 564(b)(1) of the Act, 21 U.S.C. section 360bbb-3(b)(1), unless  the authorization is terminated or revoked sooner. Performed at [facility], [place] [address] [place], [place] [address]     Procedures and diagnostic studies:  Dg Chest Port 1 View  Result Date: 05/03/2019 CLINICAL DATA:  AFib and shortness of breath for 3 days EXAM: PORTABLE CHEST 1 VIEW COMPARISON:  Radiograph 09/02/2016 FINDINGS: Dual lead pacer pack overlies the left chest wall with leads in the right atrium and cardiac apex in similar orientation of prior radiographs. The heart remains enlarged. Accounting for body habitus, the lungs are clear. No consolidation, convincing features of edema, pneumothorax, or effusion. Pulmonary vascularity is normally distributed. The cardiomediastinal contours are unremarkable. No acute osseous or soft tissue abnormality. Degenerative changes are present in the and imaged spine and shoulders. Cervical fusion hardware appears grossly intact. IMPRESSION: Stable cardiomegaly No acute cardiopulmonary abnormality. Electronically Signed   By: Lovena Le M.D.   On:  05/03/2019 16:08    Medications:    allopurinol  300 mg Oral Daily   amitriptyline  100 mg Oral QHS   apixaban  5 mg Oral BID   atorvastatin  40 mg Oral Daily   dextrose  1 ampule Intravenous Once   DULoxetine  60 mg Oral QHS   insulin aspart  0-5 Units Subcutaneous QHS   insulin aspart  0-9 Units Subcutaneous TID WC   levothyroxine  200 mcg Oral Q0600   mometasone-formoterol  2 puff Inhalation BID   Continuous Infusions:  sodium chloride 75 mL/hr at 05/03/19 2200     LOS: 0 days   Geradine Girt  [facility]   How to contact the [facility] Attending or Consulting provider [place] or covering provider during after hours [place], for this patient?  1. Check the care team in [facility] and look for a) attending/consulting [facility] provider listed and b) the [facility] team listed 2. Log into [email or web address] and use 's universal password to access. If you do not have the password, please contact the hospital operator. 3. Locate the [facility] provider you are looking for under [facility] and page to a number that you can be directly reached. 4. If you still have difficulty reaching the provider, please page the [facility] (Director on Call) for the Hospitalists listed on amion for assistance.  05/04/2019, 1:35 PM

## 2019-05-04 NOTE — Progress Notes (Signed)
Patient had 13 beats of vtach. Patient is asymptomatic and is resting. MD has been paged. Will continue to monitor.

## 2019-05-04 NOTE — Progress Notes (Signed)
Set patient upon CPAP full face mask patient states this is her home reg 7.5 CMH20 with 3 LPM bled in.

## 2019-05-05 LAB — LACTOFERRIN, FECAL, QUALITATIVE: Lactoferrin, Fecal, Qual: POSITIVE — AB

## 2019-05-05 LAB — BASIC METABOLIC PANEL
Anion gap: 11 (ref 5–15)
BUN: 47 mg/dL — ABNORMAL HIGH (ref 8–23)
CO2: 22 mmol/L (ref 22–32)
Calcium: 8.9 mg/dL (ref 8.9–10.3)
Chloride: 107 mmol/L (ref 98–111)
Creatinine, Ser: 1.6 mg/dL — ABNORMAL HIGH (ref 0.44–1.00)
GFR calc Af Amer: 37 mL/min — ABNORMAL LOW (ref >60)
GFR calc non Af Amer: 32 mL/min — ABNORMAL LOW (ref >60)
Glucose, Bld: 96 mg/dL (ref 70–99)
Potassium: 4.5 mmol/L (ref 3.5–5.1)
Sodium: 140 mmol/L (ref 135–145)

## 2019-05-05 LAB — GLUCOSE, CAPILLARY
Glucose-Capillary: 78 mg/dL (ref 70–99)
Glucose-Capillary: 107 mg/dL — ABNORMAL HIGH (ref 70–99)

## 2019-05-05 MED ORDER — CLONAZEPAM 0.5 MG PO TABS: 0.5000 mg | ORAL_TABLET | Freq: Every day | ORAL | Status: DC | PRN

## 2019-05-05 MED ORDER — FUROSEMIDE 40 MG PO TABS: 40.0000 mg | ORAL_TABLET | Freq: Every day | ORAL | Status: DC

## 2019-05-05 MED ORDER — POTASSIUM CHLORIDE CRYS ER 20 MEQ PO TBCR: EXTENDED_RELEASE_TABLET | ORAL | 3 refills | Status: DC

## 2019-05-05 NOTE — Discharge Summary (Signed)
Physician Discharge Summary  [name] [name] ZOX:[id number] DOB: 07-Oct-1945 DOA: 05/03/2019  PCP: Biagio Borg, MD  Admit date: 05/03/2019 Discharge date: 05/05/2019  Admitted From: home Discharge disposition: home   Recommendations for Outpatient Follow-Up:   1. BMP 1 week   Discharge Diagnosis:   Active Problems:   Hypotension   AKI (acute kidney injury) ([place])    Discharge Condition: Improved.  Diet recommendation: Low sodium, heart healthy.  Carbohydrate-modified.    Wound care: None.  Code status: Full.   History of Present Illness:   Patient is a 73 year old female with multiple cardiac and medical problems.  Patient carries diagnosis of chronic atrial fibrillation/flutter, coronary artery disease, breast cancer, COPD on home oxygen 2 L/min via nasal cannula, hypertension, hyperlipidemia, hypothyroidism, obesity hypoventilation syndrome, OSA on CPAP, PFO, syncope and collapse amongst other medical and cardiac history.  Apparently, patient contacted the atrial fibrillation clinic because of 3-day history of diarrhea, rapid heart rate (124 bpm), presyncope and hypotension (blood pressure of 88/67 mmHg with worsening chronic shortness of breath.  Patient was on diuretics prior to presentation.  Patient was advised to come to the ER for further assessment and management.  In the ER, patient was cardioverted, but was noted to be more hypotensive, with blood pressure of 74/64 mmHg.  Elevated serum creatinine of 1.96 was also noted, up from 0.8.  Potassium of 5.7 was noted on presentation, however, patient was on potassium supplements prior to presentation.  Patient was seen alongside patient's daughter.  No headache, no neck pain, no chest pain, no shortness of breath, no fever or chills, no URI symptoms, no urinary symptoms.  Last diarrhea stool was earlier this morning.  Hospitalist team has been asked to admit patient for further assessment and management.   Hospital  Course by Problem:   Hypotension: This is likely multifactorial. -Patient has had diarrhea, poor p.o. intake and was on diuretics with a fib with RVR. -resolved  Acute kidney injury: Suspect prerenal IVF -strict I/Os -U/A unremarkable -Urine Na: 11-- most likely dehydration  Hyperkalemia -d/c'd PO replacement for now -resolved with IVF  Diarrhea: - appears resolved-- patient stated none further since hospitalization  A. fib with RVR: -status post cardioversion in ER  Coronary artery disease: Stable. Continue to monitor closely.  COPD on home oxygen: -wears 2L at home  obesity Body mass index is 43.55 kg/m.  Hypothyroid -TSH normal -continue home med  OSA -CPAP QHS    Medical Consultants:      Discharge Exam:   Vitals:   05/05/19 0807 05/05/19 1341  BP:  98/64  Pulse:  94  Resp:  18  Temp:  97.7 F (36.5 C)  SpO2: 97% 100%   Vitals:   05/05/19 0515 05/05/19 0536 05/05/19 0807 05/05/19 1341  BP:  111/77  98/64  Pulse:  83  94  Resp:  18  18  Temp:  97.6 F (36.4 C)  97.7 F (36.5 C)  TempSrc:  Oral  Oral  SpO2:  100% 97% 100%  Weight: 124.8 kg     Height:        General exam: Appears calm and comfortable.    The results of significant diagnostics from this hospitalization (including imaging, microbiology, ancillary and laboratory) are listed below for reference.     Procedures and Diagnostic Studies:   Dg Chest Port 1 View  Result Date: 05/03/2019 CLINICAL DATA:  AFib and shortness of breath for 3 days EXAM: PORTABLE CHEST 1  VIEW COMPARISON:  Radiograph 09/02/2016 FINDINGS: Dual lead pacer pack overlies the left chest wall with leads in the right atrium and cardiac apex in similar orientation of prior radiographs. The heart remains enlarged. Accounting for body habitus, the lungs are clear. No consolidation, convincing features of edema, pneumothorax, or effusion. Pulmonary vascularity is normally distributed. The  cardiomediastinal contours are unremarkable. No acute osseous or soft tissue abnormality. Degenerative changes are present in the and imaged spine and shoulders. Cervical fusion hardware appears grossly intact. IMPRESSION: Stable cardiomegaly No acute cardiopulmonary abnormality. Electronically Signed   By: Lovena Le M.D.   On: 05/03/2019 16:08     Labs:   Basic Metabolic Panel: Recent Labs  Lab 05/03/19 1630 05/03/19 2217 05/04/19 0147 05/05/19 0739  NA 143  --  141 140  K 5.7*  --  5.9* 4.5  CL 106  --  106 107  CO2 28  --  22 22  GLUCOSE 96  --  91 96  BUN 36*  --  43* 47*  CREATININE 1.96*  --  2.22* 1.60*  CALCIUM 9.5  --  9.0 8.9  MG  --  2.4  --   --   PHOS  --  6.5*  --   --    GFR Estimated Creatinine Clearance: 42.9 mL/min (A) (by C-G formula based on SCr of 1.6 mg/dL (H)). Liver Function Tests: Recent Labs  Lab 05/04/19 0147  ALBUMIN 3.7   No results for input(s): LIPASE, AMYLASE in the last 168 hours. No results for input(s): AMMONIA in the last 168 hours. Coagulation profile No results for input(s): INR, PROTIME in the last 168 hours.  CBC: Recent Labs  Lab 05/03/19 1630 05/04/19 0147  WBC 6.7 8.4  HGB 11.8* 10.9*  HCT 40.6 35.6*  MCV 96.7 93.4  PLT 178 151   Cardiac Enzymes: No results for input(s): CKTOTAL, CKMB, CKMBINDEX, TROPONINI in the last 168 hours. BNP: Invalid input(s): POCBNP CBG: Recent Labs  Lab 05/04/19 1149 05/04/19 1633 05/04/19 2103 05/05/19 0621 05/05/19 1154  GLUCAP 91 100* 108* 78 107*   D-Dimer No results for input(s): DDIMER in the last 72 hours. Hgb A1c Recent Labs    05/03/19 2217  HGBA1C 5.2   Lipid Profile No results for input(s): CHOL, HDL, LDLCALC, TRIG, CHOLHDL, LDLDIRECT in the last 72 hours. Thyroid function studies Recent Labs    05/03/19 2217  TSH 3.536   Anemia work up No results for input(s): VITAMINB12, FOLATE, FERRITIN, TIBC, IRON, RETICCTPCT in the last 72 hours. Microbiology Recent  Results (from the past 240 hour(s))  SARS Coronavirus 2 [facility] order, Performed in [facility] hospital lab)     Status: None   Collection Time: 05/03/19  7:29 PM  Result Value Ref Range Status   SARS Coronavirus 2 NEGATIVE NEGATIVE Final    Comment: (NOTE) If result is NEGATIVE SARS-CoV-2 target nucleic acids are NOT DETECTED. The SARS-CoV-2 RNA is generally detectable in upper and lower  respiratory specimens during the acute phase of infection. The lowest  concentration of SARS-CoV-2 viral copies this assay can detect is 250  copies / mL. A negative result does not preclude SARS-CoV-2 infection  and should not be used as the sole basis for treatment or other  patient management decisions.  A negative result may occur with  improper specimen collection / handling, submission of specimen other  than nasopharyngeal swab, presence of viral mutation(s) within the  areas targeted by this assay, and inadequate number of viral  copies  (<250 copies / mL). A negative result must be combined with clinical  observations, patient history, and epidemiological information. If result is POSITIVE SARS-CoV-2 target nucleic acids are DETECTED. The SARS-CoV-2 RNA is generally detectable in upper and lower  respiratory specimens dur ing the acute phase of infection.  Positive  results are indicative of active infection with SARS-CoV-2.  Clinical  correlation with patient history and other diagnostic information is  necessary to determine patient infection status.  Positive results do  not rule out bacterial infection or co-infection with other viruses. If result is PRESUMPTIVE POSTIVE SARS-CoV-2 nucleic acids MAY BE PRESENT.   A presumptive positive result was obtained on the submitted specimen  and confirmed on repeat testing.  While 2019 novel coronavirus  (SARS-CoV-2) nucleic acids may be present in the submitted sample  additional confirmatory testing may be necessary for epidemiological  and  / or clinical management purposes  to differentiate between  SARS-CoV-2 and other Sarbecovirus currently known to infect humans.  If clinically indicated additional testing with an alternate test  methodology [phone]) is advised. The SARS-CoV-2 RNA is generally  detectable in upper and lower respiratory sp ecimens during the acute  phase of infection. The expected result is Negative. Fact Sheet for Patients:  StrictlyIdeas.no Fact Sheet for Healthcare Providers: BankingDealers.co.za This test is not yet approved or cleared by the [place] FDA and has been authorized for detection and/or diagnosis of SARS-CoV-2 by FDA under an Emergency Use Authorization (EUA).  This EUA will remain in effect (meaning this test can be used) for the duration of the COVID-19 declaration under Section 564(b)(1) of the Act, 21 U.S.C. section 360bbb-3(b)(1), unless the authorization is terminated or revoked sooner. Performed at [facility], [place] [address], [place], [place] [address]      Discharge Instructions:   Discharge Instructions    Diet - low sodium heart healthy   Complete by: As directed    Discharge instructions   Complete by: As directed    BMP check 1 week re: Cr and K If further episode of prolonged diarrhea, would speak with either PCP or cardiology as you may need to hold lasix dose in order to avoid dehydration   Increase activity slowly   Complete by: As directed      Allergies as of 05/05/2019      Reactions   Ciprofloxacin Other (See Comments)   foliculitis   Cephalexin Other (See Comments)   Exfoliative dermatitis reaction   Codeine    REACTION: unknown - pt. states unaware of having reaction to codeine   Dilaudid [hydromorphone] Other (See Comments)   Other Reaction: Oversedation   Lisinopril    REACTION: cough   Cleocin [clindamycin Hcl] Rash   rash   Doxycycline Rash   Penicillins Rash   Did it involve  swelling of the face/tongue/throat, SOB, or low BP? No Did it involve sudden or severe rash/hives, skin peeling, or any reaction on the inside of your mouth or nose? Yes Did you need to seek medical attention at a hospital or doctor's office?Yes When did it last happen?several years ago If all above answers are "NO", may proceed with cephalosporin use.      Medication List    STOP taking these medications   famotidine 20 MG tablet Commonly known as: PEPCID     TAKE these medications   allopurinol 300 MG tablet Commonly known as: ZYLOPRIM TAKE 1 TABLET DAILY   amitriptyline 100 MG tablet Commonly  known as: ELAVIL TAKE 1 TABLET BY MOUTH AT BEDTIME   apixaban 5 MG Tabs tablet Commonly known as: Eliquis Take 1 tablet (5 mg total) by mouth 2 (two) times daily.   atorvastatin 40 MG tablet Commonly known as: LIPITOR TAKE 1 TABLET DAILY   AZO-CRANBERRY PO Take 1 tablet by mouth daily as needed.   budesonide-formoterol 160-4.5 MCG/ACT inhaler Commonly known as: Symbicort Inhale 2 puffs into the lungs 2 (two) times daily.   clonazePAM 0.5 MG tablet Commonly known as: KLONOPIN Take 1 tablet (0.5 mg total) by mouth daily as needed for anxiety.   dextromethorphan-guaiFENesin 30-600 MG 12hr tablet Commonly known as: MUCINEX DM Take 1 tablet by mouth 2 (two) times daily as needed for cough.   DULoxetine 30 MG capsule Commonly known as: CYMBALTA TAKE 2 CAPSULES AT BEDTIME What changed:   how much to take  when to take this  reasons to take this  additional instructions   furosemide 40 MG tablet Commonly known as: LASIX Take 1 tablet (40 mg total) by mouth daily. Start taking on: May 06, 2019 What changed: See the new instructions.   IRON PO Take 1 tablet by mouth every evening.   levothyroxine 200 MCG tablet Commonly known as: SYNTHROID Take 1 tablet (200 mcg total) by mouth daily.   omeprazole 40 MG capsule Commonly known as: PRILOSEC TAKE 1 CAPSULE  DAILY   OXYGEN 2lpm with CPAP at night and with exertion   potassium chloride SA 20 MEQ tablet Commonly known as: K-DUR 1 daily What changed: additional instructions   ProAir HFA 108 (90 Base) MCG/ACT inhaler Generic drug: albuterol Inhale 2 puffs into the lungs every 4 (four) hours as needed for wheezing or shortness of breath.   traMADol 50 MG tablet Commonly known as: ULTRAM TAKE 1 TABLET BY MOUTH EVERY 8 HOURS IF NEEDED FOR PAIN What changed: See the new instructions.   VITAMIN B COMPLEX PO Take 1 tablet by mouth daily.   vitamin C 500 MG tablet Commonly known as: ASCORBIC ACID Take 500 mg by mouth daily.   Vitamin D 50 MCG (2000 UT) tablet Take 2,000 Units by mouth at bedtime.   Vitamin D2 50 MCG (2000 UT) Tabs Take 1 tablet by mouth daily.   vitamin E 1000 UNIT capsule Take 1,000 Units by mouth daily.      Follow-up Information    Biagio Borg, MD Follow up in 1 week(s).   Specialties: Internal Medicine, Radiology Why: BMP Contact information: [place] [address] [phone]        Lelon Perla, MD .   Specialty: Cardiology Contact information: [address] [place] [address] [phone]            Time coordinating discharge: 25 min  Signed:  Geradine Girt DO  [facility] 05/05/2019, 3:53 PM

## 2019-05-05 NOTE — Discharge Instructions (Signed)

## 2019-05-05 NOTE — TOC Transition Note (Signed)
Transition of Care [facility]) - CM/SW Discharge Note   Patient Details  Name: [name] [name] MRN: [id number] Date of Birth: [birth year]  Transition of Care [facility]) CM/SW Contact:  Carles Collet, RN Phone Number: 05/05/2019, 12:34 PM   Clinical Narrative:   SPoke to patient. She has home oxygen through [place], She states that her friend who will be picking her up can bring her portable tank. She states that does not need to use it continuously, and records show that she has been 94RA during stay. She denies need for additional help at home. No other CM needs identified.     Final next level of care: Home/Self Care Barriers to Discharge: No Barriers Identified   Patient Goals and CMS Choice        Discharge Placement                       Discharge Plan and Services                                     Social Determinants of Health (SDOH) Interventions     Readmission Risk Interventions Readmission Risk Prevention Plan 05/05/2019  Transportation Screening Complete  PCP or Specialist Appt within 3-5 Days Complete  HRI or [place] Complete  Social Work Consult for [place] Planning/Counseling Complete  Palliative Care Screening Not Applicable  Medication Review [employer]) Complete  Some recent data might be hidden

## 2019-05-05 NOTE — Progress Notes (Signed)
Discharge instructions and medication education given with teach back. Peripheral iv removed, clean dry and intact, pressure and dressing applied. Pt's belongings in bag: cell phone, ipad, charger and bag.  Questions and concerns answered. Pt states that daughter will be coming to bring her clothes.  Waiting for daughter to arrive.

## 2019-05-07 ENCOUNTER — Ambulatory Visit (INDEPENDENT_AMBULATORY_CARE_PROVIDER_SITE_OTHER): Payer: Medicare HMO | Admitting: *Deleted

## 2019-05-07 ENCOUNTER — Telehealth: Payer: Self-pay

## 2019-05-07 DIAGNOSIS — I495 Sick sinus syndrome: Secondary | ICD-10-CM | POA: Diagnosis not present

## 2019-05-07 NOTE — Telephone Encounter (Signed)
Pt is on TCM list. Admitted on 05/03/2019 and dc'ed on 05/05/2019. Hyperkalemia, SOB, Afib with rapid ventricular response, AKI, Hypotension.   Pt to follow up with PCP. No appt made. LVM for pt to call back.

## 2019-05-08 LAB — O&P RESULT

## 2019-05-08 LAB — OVA + PARASITE EXAM

## 2019-05-08 NOTE — Telephone Encounter (Signed)
Called pt to follow=up on msg below. Pt was D/C on 05/05/19. Completed TCM call below.Johny Chess  Transition Care Management Follow-up Telephone Call   Date discharged? 05/05/19   How have you been since you were released from the hospital? Pt states she is doing alright   Do you understand why you were in the hospital? YES   Do you understand the discharge instructions? YES   Where were you discharged to? Home   Items Reviewed:  Medications reviewed: YES, pt states she is no longer taking famotidine  Allergies reviewed: YES  Dietary changes reviewed: YES, low sodium, heart healthy and carb modified. Pt states she is able to eat without going to the bathroom  Referrals reviewed: No referral recommended   Functional Questionnaire:   Activities of Daily Living (ADLs):   She states she are independent in the following: ambulation, bathing and hygiene, feeding, continence, grooming, toileting and dressing States she require assistance with the following: ambulation sometimes, have a cane she uses. Also on oxygen   Any transportation issues/concerns?: NO   Any patient concerns? NO   Confirmed importance and date/time of follow-up visits scheduled YES, pt states she need an early morning appt so her daughter will  Be able to bring her. MD 1st morning appt 05/22/19  Provider Appointment booked with Dr. Jenny Reichmann   Confirmed with patient if condition begins to worsen call PCP or go to the ER.  Patient was given the office number and encouraged to call back with question or concerns.  : YES

## 2019-05-09 LAB — CUP PACEART REMOTE DEVICE CHECK
Date Time Interrogation Session: 20200819093512
Implantable Lead Implant Date: 20141212
Implantable Lead Implant Date: 20141212
Implantable Lead Location: 753859
Implantable Lead Location: 753860
Implantable Lead Model: 346
Implantable Lead Model: 350
Implantable Lead Serial Number: 29409274
Implantable Lead Serial Number: 29535412
Implantable Pulse Generator Implant Date: 20141212
Pulse Gen Serial Number: 68181424

## 2019-05-10 ENCOUNTER — Other Ambulatory Visit: Payer: Self-pay

## 2019-05-10 ENCOUNTER — Encounter (HOSPITAL_COMMUNITY): Payer: Self-pay | Admitting: Emergency Medicine

## 2019-05-10 ENCOUNTER — Emergency Department (HOSPITAL_COMMUNITY): Payer: Medicare HMO

## 2019-05-10 ENCOUNTER — Emergency Department (HOSPITAL_COMMUNITY)
Admission: EM | Admit: 2019-05-10 | Discharge: 2019-05-10 | Disposition: A | Discharge status: Home / Self Care | Payer: Medicare HMO | Attending: Emergency Medicine | Admitting: Emergency Medicine

## 2019-05-10 DIAGNOSIS — J449 Chronic obstructive pulmonary disease, unspecified: Secondary | ICD-10-CM | POA: Diagnosis not present

## 2019-05-10 DIAGNOSIS — I959 Hypotension, unspecified: Secondary | ICD-10-CM | POA: Diagnosis not present

## 2019-05-10 DIAGNOSIS — I2510 Atherosclerotic heart disease of native coronary artery without angina pectoris: Secondary | ICD-10-CM | POA: Insufficient documentation

## 2019-05-10 DIAGNOSIS — R0789 Other chest pain: Secondary | ICD-10-CM | POA: Insufficient documentation

## 2019-05-10 DIAGNOSIS — R079 Chest pain, unspecified: Secondary | ICD-10-CM | POA: Diagnosis not present

## 2019-05-10 DIAGNOSIS — Z950 Presence of cardiac pacemaker: Secondary | ICD-10-CM | POA: Diagnosis not present

## 2019-05-10 DIAGNOSIS — I4891 Unspecified atrial fibrillation: Secondary | ICD-10-CM | POA: Diagnosis not present

## 2019-05-10 DIAGNOSIS — E039 Hypothyroidism, unspecified: Secondary | ICD-10-CM | POA: Insufficient documentation

## 2019-05-10 DIAGNOSIS — I10 Essential (primary) hypertension: Secondary | ICD-10-CM | POA: Insufficient documentation

## 2019-05-10 DIAGNOSIS — Z87891 Personal history of nicotine dependence: Secondary | ICD-10-CM | POA: Diagnosis not present

## 2019-05-10 DIAGNOSIS — Z79899 Other long term (current) drug therapy: Secondary | ICD-10-CM | POA: Insufficient documentation

## 2019-05-10 DIAGNOSIS — R1013 Epigastric pain: Secondary | ICD-10-CM | POA: Diagnosis not present

## 2019-05-10 DIAGNOSIS — R000 Tachycardia, unspecified: Secondary | ICD-10-CM | POA: Diagnosis not present

## 2019-05-10 LAB — CBC
HCT: 34.9 % — ABNORMAL LOW (ref 36.0–46.0)
Hemoglobin: 10.3 g/dL — ABNORMAL LOW (ref 12.0–15.0)
MCH: 29 pg (ref 26.0–34.0)
MCHC: 29.5 g/dL — ABNORMAL LOW (ref 30.0–36.0)
MCV: 98.3 fL (ref 80.0–100.0)
Platelets: 150 10*3/uL (ref 150–400)
RBC: 3.55 MIL/uL — ABNORMAL LOW (ref 3.87–5.11)
RDW: 18.5 % — ABNORMAL HIGH (ref 11.5–15.5)
WBC: 6.7 10*3/uL (ref 4.0–10.5)
nRBC: 0 % (ref 0.0–0.2)

## 2019-05-10 LAB — BASIC METABOLIC PANEL
Anion gap: 8 (ref 5–15)
BUN: 27 mg/dL — ABNORMAL HIGH (ref 8–23)
CO2: 32 mmol/L (ref 22–32)
Calcium: 9.4 mg/dL (ref 8.9–10.3)
Chloride: 104 mmol/L (ref 98–111)
Creatinine, Ser: 1.15 mg/dL — ABNORMAL HIGH (ref 0.44–1.00)
GFR calc Af Amer: 55 mL/min — ABNORMAL LOW (ref >60)
GFR calc non Af Amer: 47 mL/min — ABNORMAL LOW (ref >60)
Glucose, Bld: 92 mg/dL (ref 70–99)
Potassium: 4.3 mmol/L (ref 3.5–5.1)
Sodium: 144 mmol/L (ref 135–145)

## 2019-05-10 LAB — TROPONIN I (HIGH SENSITIVITY)
Troponin I (High Sensitivity): 15 ng/L (ref <18)
Troponin I (High Sensitivity): 14 ng/L (ref <18)

## 2019-05-10 MED ORDER — ALUM & MAG HYDROXIDE-SIMETH 200-200-20 MG/5ML PO SUSP
30.0000 mL | Freq: Once | ORAL | Status: AC
  Administered 2019-05-10: 08:00:00 30 mL via ORAL
  Filled 2019-05-10: qty 30

## 2019-05-10 MED ORDER — SODIUM CHLORIDE 0.9% FLUSH: 3.0000 mL | Freq: Once | INTRAVENOUS | Status: DC

## 2019-05-10 MED ORDER — LIDOCAINE VISCOUS HCL 2 % MT SOLN
15.0000 mL | Freq: Once | OROMUCOSAL | Status: AC
  Administered 2019-05-10: 15 mL via ORAL
  Filled 2019-05-10: qty 15

## 2019-05-10 MED ORDER — OMEPRAZOLE 40 MG PO CPDR: 40.0000 mg | DELAYED_RELEASE_CAPSULE | Freq: Every day | ORAL | 0 refills | Status: AC

## 2019-05-10 NOTE — Discharge Instructions (Signed)
Take prilosec as prescribed.  Call and follow up with your doctor for further care.  Return if you have any concerns.

## 2019-05-10 NOTE — ED Triage Notes (Signed)
Pt BIB [facility] from home, c/o intermittent chest pain since noon today. Denies other associated symptoms. Pt took pepcid tonight at 2200 with no change. Given 373m asa by EMS. [facility] VS: BP 119/74, HR 93, RR 24, SpO2 100% 2L [place]

## 2019-05-10 NOTE — ED Provider Notes (Signed)
[facility] EMERGENCY DEPARTMENT Provider Note   CSN: [id number] Arrival date & time: 05/10/19  0026     History   Chief Complaint Chief Complaint  Patient presents with  . Chest Pain    HPI [name] [name] is a 73 y.o. female.     The history is provided by the patient, a relative and medical records. No language interpreter was used.  Chest Pain    73 year old female with history of atrial fibrillation currently on Eliquis, CAD, COPD currently on home O2 at 2 L brought here via EMS from home for evaluation of chest pain.  Patient report during lunch yesterday she was trying to eat a yogurt but then she developed pain which starts from the epigastric region and radiates towards her throat.  She described pain as a "indigestion x1000" that was waxing and waning but has been ongoing since.  Pain seems to be increasing while trying to eat or drink.  She has never had this kind of pain before.  She tries taking Pepcid, as well as tramadol with some improvement.  She denies any associated fever chills no lightheadedness or dizziness no shortness of breath, productive cough, diaphoresis, or radiating pain to her arms.  Patient was admitted to the hospital a week ago due to having high palpitation secondary to atrial fibrillation/flutter.  She was cardioverted in the ED however sedative medication infiltrates during the procedure thus causing her to have chest discomfort from the shock.  She mentioned that particular pain has since resolved and this pain felt different.  At the current moment she denies any significant pain.  No report of nausea vomiting or diarrhea.  Past Medical History:  Diagnosis Date  . Anemia   . Asthma 06/13/2011   hx of  . Atrial fibrillation ([place])   . Atrial flutter ([place])   . Breast cancer ([place]) dx'd 2005   No BP check or stick in Left arm  . CAD (coronary artery disease)    a. Nonobstructive CAD 2007 (EF 75%.  RCA  proximal 75% tubular, 25% prox  LAD, 25% d1, 50% D2) at [facility]. b. NSTEMI 01/2010 in setting of respiratory failure, medical approach (not a good candidate for noninvasive eval)  . Cellulitis    hx of cellulitis in left leg  . Cervical radiculopathy 06/13/2011  . COPD (chronic obstructive pulmonary disease) ([place])    on O2  . Depression 06/13/2011  . Diverticulosis   . Esophageal dysmotility   . Gout 06/13/2011  . History of Clostridium difficile early dec 2015   no current diarrhea  . History of home oxygen therapy    2 liters per nasal cannula all the time  . Hx of dysfunctional uterine bleeding    last bleeding jan 2016, worked up for with d and c x 2 no cause found  . Hyperlipidemia   . Hypertension   . Hypothyroidism   . Impaired glucose tolerance 11/01/2012  . Neuropathy 06/13/2011  . Obesity hypoventilation syndrome ([place]) 06/13/2011  . OSA on CPAP   . Personal history of chemotherapy   . Personal history of radiation therapy   . PFO (patent foramen ovale)   . Risk for falls   . Syncope and collapse    Bradycardia with pauses. S/P pacemaker  . Tubular adenoma of colon     Patient Active Problem List   Diagnosis Date Noted  . AKI (acute kidney injury) ([place]) 05/04/2019  . Hypotension 05/03/2019  . Anxiety 03/22/2018  .  COPD with acute exacerbation ([place]) 12/28/2017  . Chronic respiratory failure with hypoxia and hypercapnia ([place]) 11/04/2016  . Tinea 09/26/2016  . Dyspnea on exertion 09/21/2016  . Morbid (severe) obesity due to excess calories ([place]) 09/21/2016  . History of left breast cancer 06/08/2016  . Panic attacks 04/27/2016  . Chest pain 04/11/2016  . OSA (obstructive sleep apnea) 02/12/2016  . Adhesive capsulitis of right shoulder 08/21/2015  . Right shoulder pain 07/16/2015  . Osteoarthritis, hand 07/16/2015  . Hair loss 07/16/2015  . Blood in mouth of unknown source 05/06/2015  . Dysphagia, pharyngoesophageal phase 05/06/2015  . Chronic respiratory failure ([place]) 10/28/2014  . Cellulitis of  female breast 10/25/2014  . Rash and nonspecific skin eruption 10/25/2014  . Hx of colonic polyps   . Benign neoplasm of cecum   . Benign neoplasm of transverse colon   . CHF (congestive heart failure) ([place])   . History of Clostridium difficile 06/27/2014  . RUQ pain 05/31/2014  . Lump in the abdomen 05/31/2014  . Diarrhea 05/14/2014  . Abdominal pain, other specified site 05/14/2014  . Pacemaker 12/03/2013  . Exfoliative dermatitis 11/27/2013  . Urinary urgency 11/13/2013  . Post-menopausal bleeding 11/13/2013  . Syncope and collapse 08/30/2013  . Atrial fibrillation ([place])   . Syncope 08/26/2013  . Postmenopausal vaginal bleeding 05/02/2013  . Tachy-brady syndrome ([place]) 03/15/2013  . Acute on chronic diastolic CHF (congestive heart failure) ([place]) 03/15/2013  . PFO (patent foramen ovale) 03/15/2013  . Hypokalemia 03/15/2013  . Atrial flutter ([place]) 03/01/2013  . Tachycardia 03/01/2013  . Anticoagulant long-term use 03/01/2013  . Impaired glucose tolerance 11/01/2012  . Abnormal liver function tests 11/01/2012  . Right lumbar radiculopathy 04/28/2012  . Cough 04/15/2012  . Abnormal TSH 04/15/2012  . Acute appendicitis 03/29/2012  . Sick sinus syndrome ([place]) 03/08/2012  . Morbid obesity with BMI of 50.0-59.9, adult ([place]) 03/08/2012  . Breast cancer ([place]) 06/13/2011  . Asthma 06/13/2011  . Depression 06/13/2011  . Colon polyps 06/13/2011  . Neuropathy 06/13/2011  . Cervical radiculopathy 06/13/2011  . Gout 06/13/2011  . Obesity hypoventilation syndrome ([place]) 06/13/2011  . Cellulitis of leg, right 06/13/2011  . Preventative health care 06/04/2011  . OXYGEN-USE OF SUPPLEMENTAL 04/07/2010  . ACUT MI SUBENDOCARDIAL INFARCT SUBSQT EPIS CARE 03/13/2010  . COPD GOLD III 03/10/2010  . Sleep apnea 03/10/2010  . Hypothyroidism, acquired 03/17/2009  . Hyperlipidemia 03/17/2009  . Essential hypertension 03/17/2009  . Coronary atherosclerosis 03/17/2009    Past Surgical History:   Procedure Laterality Date  . BREAST BIOPSY    . BREAST LUMPECTOMY Left 05/2004  . CARDIOVERSION N/A 03/15/2013   Procedure: CARDIOVERSION;  Surgeon: Thayer Headings, MD;  Location: [facility] ENDOSCOPY;  Service: Cardiovascular;  Laterality: N/A;  . CERVICAL BIOPSY  June 2013   at [facility] for Heavy  Bleeding  . [place] SURGERY  2004 or 2005   have cadaver bones,, screws, and plates c 5 to c 6  . COLONOSCOPY    . COLONOSCOPY WITH PROPOFOL N/A 10/22/2014   Procedure: COLONOSCOPY WITH PROPOFOL;  Surgeon: Jerene Bears, MD;  Location: [facility] ENDOSCOPY;  Service: Gastroenterology;  Laterality: N/A;  . DILATION AND CURETTAGE OF UTERUS    . LAPAROSCOPIC APPENDECTOMY  03/29/2012   Procedure: APPENDECTOMY LAPAROSCOPIC;  Surgeon: Adin Hector, MD;  Location: [facility] ORS;  Service: General;  Laterality: N/A;  . LOOP RECORDER EXPLANT  08/31/2013   Procedure: LOOP RECORDER EXPLANT;  Surgeon: Coralyn Mark, MD;  Location: [facility] CATH LAB;  Service: Cardiovascular;;  . LOOP RECORDER IMPLANT  08-28-2013; 08-31-2013   MDT LinQ implanted by Dr Rayann Heman for syncope; explanted 08-31-2013 after sinus pauses identified  . LOOP RECORDER IMPLANT N/A 08/28/2013   Procedure: LOOP RECORDER IMPLANT;  Surgeon: Coralyn Mark, MD;  Location: [facility] CATH LAB;  Service: Cardiovascular;  Laterality: N/A;  . OVARIAN CYST REMOVAL    . PACEMAKER INSERTION  08-31-2013   Biotronik Evera dual chamber pacemaker placed for neurocardiogenic syncope and sinus pauses by Dr Rayann Heman  . PERMANENT PACEMAKER INSERTION N/A 08/31/2013   Procedure: PERMANENT PACEMAKER INSERTION;  Surgeon: Coralyn Mark, MD;  Location: [facility] CATH LAB;  Service: Cardiovascular;  Laterality: N/A;  . TEE WITHOUT CARDIOVERSION N/A 03/15/2013   Procedure: TRANSESOPHAGEAL ECHOCARDIOGRAM (TEE);  Surgeon: Thayer Headings, MD;  Location: [facility] ENDOSCOPY;  Service: Cardiovascular;  Laterality: N/A;     OB History   No obstetric history on file.      Home Medications    Prior to Admission  medications   Medication Sig Start Date End Date Taking? Authorizing Provider  allopurinol (ZYLOPRIM) 300 MG tablet TAKE 1 TABLET DAILY 05/17/17   Biagio Borg, MD  amitriptyline (ELAVIL) 100 MG tablet TAKE 1 TABLET BY MOUTH AT BEDTIME 04/18/18   Biagio Borg, MD  apixaban (ELIQUIS) 5 MG TABS tablet Take 1 tablet (5 mg total) by mouth 2 (two) times daily. 11/21/18   Lelon Perla, MD  atorvastatin (LIPITOR) 40 MG tablet TAKE 1 TABLET DAILY 09/26/18   Lelon Perla, MD  AZO-CRANBERRY PO Take 1 tablet by mouth daily as needed.    [provider]  B Complex Vitamins (VITAMIN B COMPLEX PO) Take 1 tablet by mouth daily.    [provider]  budesonide-formoterol (SYMBICORT) 160-4.5 MCG/ACT inhaler Inhale 2 puffs into the lungs 2 (two) times daily. 02/21/19   Biagio Borg, MD  Cholecalciferol (VITAMIN D) 2000 UNITS tablet Take 2,000 Units by mouth at bedtime.    [provider]  clonazePAM (KLONOPIN) 0.5 MG tablet Take 1 tablet (0.5 mg total) by mouth daily as needed for anxiety. 05/05/19   Geradine Girt, DO  DULoxetine (CYMBALTA) 30 MG capsule TAKE 2 CAPSULES AT BEDTIME Patient taking differently: Take 30 mg by mouth daily as needed. For nerve pain 11/20/18   Biagio Borg, MD  Ergocalciferol (VITAMIN D2) 50 MCG (2000 UT) TABS Take 1 tablet by mouth daily.    [provider]  Ferrous Sulfate (IRON PO) Take 1 tablet by mouth every evening.    [provider]  furosemide (LASIX) 40 MG tablet Take 1 tablet (40 mg total) by mouth daily. 05/06/19   Geradine Girt, DO  levothyroxine (SYNTHROID) 200 MCG tablet Take 1 tablet (200 mcg total) by mouth daily. 03/08/19   Biagio Borg, MD  omeprazole (PRILOSEC) 40 MG capsule TAKE 1 CAPSULE DAILY Patient taking differently: Take 40 mg by mouth daily.  01/30/18   Biagio Borg, MD  OXYGEN 2lpm with CPAP at night and with exertion    [provider]  potassium chloride SA (K-DUR) 20 MEQ tablet 1 daily 05/05/19    Geradine Girt, DO  PROAIR HFA 108 (90 Base) MCG/ACT inhaler Inhale 2 puffs into the lungs every 4 (four) hours as needed for wheezing or shortness of breath. 09/06/17   Tanda Rockers, MD  traMADol (ULTRAM) 50 MG tablet TAKE 1 TABLET BY MOUTH EVERY 8 HOURS IF NEEDED FOR PAIN Patient taking differently:  Take 50 mg by mouth daily as needed for moderate pain.  11/10/18   Biagio Borg, MD  vitamin C (ASCORBIC ACID) 500 MG tablet Take 500 mg by mouth daily.     [provider]  vitamin E 1000 UNIT capsule Take 1,000 Units by mouth daily.    [provider]    Family History Family History  Problem Relation Age of Onset  . Uterine cancer Mother   . Heart disease Mother   . Breast cancer Sister 51  . Ovarian cancer Sister   . Breast cancer Cousin 19  . Diabetes Brother   . Breast cancer Maternal Aunt   . Ovarian cancer Sister     Social History Social History   Tobacco Use  . Smoking status: Former Smoker    Packs/day: 1.00    Years: 20.00    Pack years: 20.00    Types: Cigarettes    Quit date: 09/20/1998    Years since quitting: 20.6  . Smokeless tobacco: Never Used  . Tobacco comment: 1ppd x 20 years  Substance Use Topics  . Alcohol use: Yes    Alcohol/week: 1.0 standard drinks    Types: 1 Glasses of wine per week    Comment: rare  . Drug use: No     Allergies   Ciprofloxacin, Cephalexin, Codeine, Dilaudid [hydromorphone], Lisinopril, Cleocin [clindamycin hcl], Doxycycline, and Penicillins   Review of Systems Review of Systems  Cardiovascular: Positive for chest pain.  All other systems reviewed and are negative.    Physical Exam Updated Vital Signs BP (!) 119/92 (BP Location: Right Arm)   Pulse 95   Temp 98.4 F (36.9 C) (Oral)   Resp 18   Ht 5' 6"  (1.676 m)   Wt 124.8 kg   SpO2 100%   BMI 44.40 kg/m   Physical Exam Vitals signs and nursing note reviewed.  Constitutional:      General: She is not in acute distress.    Appearance:  She is well-developed. She is obese.  HENT:     Head: Atraumatic.  Eyes:     Conjunctiva/sclera: Conjunctivae normal.  Neck:     Musculoskeletal: Neck supple.  Cardiovascular:     Rate and Rhythm: Rhythm irregular.     Heart sounds: Murmur present.  Pulmonary:     Effort: Pulmonary effort is normal.     Breath sounds: No decreased breath sounds, wheezing, rhonchi or rales.  Chest:     Chest wall: No tenderness.  Abdominal:     Palpations: Abdomen is soft.     Tenderness: There is abdominal tenderness (Very mild epigastric tenderness without guarding or rebound tenderness).  Musculoskeletal:     Right lower leg: No edema.     Left lower leg: No edema.  Skin:    Findings: No rash.  Neurological:     Mental Status: She is alert and oriented to person, place, and time.      ED Treatments / Results  Labs (all labs ordered are listed, but only abnormal results are displayed) Labs Reviewed  BASIC METABOLIC PANEL - Abnormal; Notable for the following components:      Result Value   BUN 27 (*)    Creatinine, Ser 1.15 (*)    GFR calc non Af Amer 47 (*)    GFR calc Af Amer 55 (*)    All other components within normal limits  CBC - Abnormal; Notable for the following components:   RBC 3.55 (*)  Hemoglobin 10.3 (*)    HCT 34.9 (*)    MCHC 29.5 (*)    RDW 18.5 (*)    All other components within normal limits  TROPONIN I (HIGH SENSITIVITY)  TROPONIN I (HIGH SENSITIVITY)    EKG None  ED ECG REPORT   Date: 05/10/2019  Rate: 98  Rhythm: normal sinus rhythm and premature ventricular contractions (PVC)  QRS Axis: normal  Intervals: normal  ST/T Wave abnormalities: normal  Conduction Disutrbances:none  Narrative Interpretation:   Old EKG Reviewed: unchanged  I have personally reviewed the EKG tracing and agree with the computerized printout as noted.   Radiology No results found.  Although chest x-ray result is not crossing over, chest x-ray reviewed by me  demonstrates CHF pattern with trace pleural effusion and vascular congestion edema.  Stable cardiomegaly   Procedures Procedures (including critical care time)  Medications Ordered in ED Medications  sodium chloride flush (NS) 0.9 % injection 3 mL (3 mLs Intravenous Not Given 05/10/19 0651)  alum & mag hydroxide-simeth (MAALOX/MYLANTA) 200-200-20 MG/5ML suspension 30 mL (30 mLs Oral Given 05/10/19 0806)    And  lidocaine (XYLOCAINE) 2 % viscous mouth solution 15 mL (15 mLs Oral Given 05/10/19 0806)     Initial Impression / Assessment and Plan / ED Course  I have reviewed the triage vital signs and the nursing notes.  Pertinent labs & imaging results that were available during my care of the patient were reviewed by me and considered in my medical decision making (see chart for details).        BP (!) 119/92 (BP Location: Right Arm)   Pulse 95   Temp 98.4 F (36.9 C) (Oral)   Resp 18   Ht 5' 6"  (1.676 m)   Wt 124.8 kg   SpO2 100%   BMI 44.40 kg/m    Final Clinical Impressions(s) / ED Diagnoses   Final diagnoses:  Atypical chest pain    ED Discharge Orders         Ordered    omeprazole (PRILOSEC) 40 MG capsule  Daily     05/10/19 0931         7:30 AM Patient here with midsternal chest pain that has been waxing waning through yesterday worsening with eating or drinking.  Pain is atypical of ACS.  No complaints of shortness of breath or productive cough or flulike symptoms or concern for COVID-19 or pneumonia.  Initial chest x-ray shows a CHF pattern with trace pleural effusion and vascular congestion without evidence of pneumonia.  EKG shows sinus rhythm with occasional PVC but no ischemic changes.  Initial troponin is normal.  Mild AKI, improved from prior.   8:59 AM Patient report improvement of symptoms with GI cocktail.  She is resting comfortably.  Negative delta troponin.  Care discussed with Dr. Billy Fischer, anticipate discharging home for close follow-up with  PCP.  Patient does have pedal edema, however she denies any increase shortness of breath.  O2 sats at 100% on 2 L which is her baseline home oxygen level.  Encourage patient to follow-up with her primary care doctor for recheck.  Will discharge home with Prilosec.  Return precaution discussed.   Domenic Moras, PA-C 05/10/19 [id number]    Gareth Morgan, MD 05/12/19 [phone]

## 2019-05-15 ENCOUNTER — Encounter: Payer: Self-pay | Admitting: Cardiology

## 2019-05-15 NOTE — Progress Notes (Signed)
Remote pacemaker transmission.   

## 2019-05-18 ENCOUNTER — Telehealth: Payer: Self-pay | Admitting: Cardiology

## 2019-05-18 NOTE — Telephone Encounter (Signed)
LVM to let patient know about appt on 05-25-19 with [facility].

## 2019-05-22 ENCOUNTER — Ambulatory Visit: Payer: Medicare HMO | Admitting: Cardiology

## 2019-05-22 ENCOUNTER — Inpatient Hospital Stay: Payer: Medicare HMO | Admitting: Internal Medicine

## 2019-05-25 ENCOUNTER — Encounter: Payer: Self-pay | Admitting: Physician Assistant

## 2019-05-25 ENCOUNTER — Ambulatory Visit (INDEPENDENT_AMBULATORY_CARE_PROVIDER_SITE_OTHER): Payer: Medicare HMO | Admitting: Physician Assistant

## 2019-05-25 ENCOUNTER — Other Ambulatory Visit: Payer: Self-pay

## 2019-05-25 VITALS — BP 128/71 | HR 112 | Ht 66.0 in | Wt 263.0 lb

## 2019-05-25 DIAGNOSIS — I480 Paroxysmal atrial fibrillation: Secondary | ICD-10-CM | POA: Diagnosis not present

## 2019-05-25 DIAGNOSIS — N179 Acute kidney failure, unspecified: Secondary | ICD-10-CM | POA: Diagnosis not present

## 2019-05-25 DIAGNOSIS — I5032 Chronic diastolic (congestive) heart failure: Secondary | ICD-10-CM | POA: Diagnosis not present

## 2019-05-25 DIAGNOSIS — J449 Chronic obstructive pulmonary disease, unspecified: Secondary | ICD-10-CM | POA: Diagnosis not present

## 2019-05-25 DIAGNOSIS — I10 Essential (primary) hypertension: Secondary | ICD-10-CM | POA: Diagnosis not present

## 2019-05-25 DIAGNOSIS — Z950 Presence of cardiac pacemaker: Secondary | ICD-10-CM

## 2019-05-25 DIAGNOSIS — R0789 Other chest pain: Secondary | ICD-10-CM | POA: Diagnosis not present

## 2019-05-25 LAB — BASIC METABOLIC PANEL
BUN/Creatinine Ratio: 18 (ref 12–28)
BUN: 19 mg/dL (ref 8–27)
CO2: 25 mmol/L (ref 20–29)
Calcium: 9.4 mg/dL (ref 8.7–10.3)
Chloride: 105 mmol/L (ref 96–106)
Creatinine, Ser: 1.05 mg/dL — ABNORMAL HIGH (ref 0.57–1.00)
GFR calc Af Amer: 61 mL/min/{1.73_m2} (ref >59)
GFR calc non Af Amer: 53 mL/min/{1.73_m2} — ABNORMAL LOW (ref >59)
Glucose: 77 mg/dL (ref 65–99)
Potassium: 4.6 mmol/L (ref 3.5–5.2)
Sodium: 148 mmol/L — ABNORMAL HIGH (ref 134–144)

## 2019-05-25 MED ORDER — POTASSIUM CHLORIDE CRYS ER 20 MEQ PO TBCR: 20.0000 meq | EXTENDED_RELEASE_TABLET | Freq: Two times a day (BID) | ORAL | 3 refills | Status: DC

## 2019-05-25 MED ORDER — FUROSEMIDE 40 MG PO TABS: ORAL_TABLET | ORAL | 3 refills | Status: DC

## 2019-05-25 MED ORDER — METOPROLOL TARTRATE 25 MG PO TABS: 12.5000 mg | ORAL_TABLET | ORAL | 3 refills | Status: DC | PRN

## 2019-05-25 NOTE — Progress Notes (Signed)
Cardiology Office Note    Date:  05/25/2019   ID:  [name] [name] Jul 18, 1946, MRN [id number]  PCP:  Biagio Borg, MD  Cardiologist:  Dr. Stanford Breed  Electrophysiology: Dr. Lovena Le  Chief Complaint  Patient presents with   Follow-up    seen for Dr. Stanford Breed. Atypical chest pain, recent admission for afib.    History of Present Illness:  [name] [name] is a 73 y.o. female with PMH of nonobstructive CAD by cath in 2007, COPD on chronic O2, PAF, sick sinus syndrome s/p Biotronik pacemaker implanted 08/2013, remote breast cancer in 2005, obstructive sleep apnea and obesity.  Previous cardiac catheterization in February 2007 showed 75% proximal RCA, less than 25% left main, 25% proximal LAD, 25% D1, and 50% D2.  Carotid Doppler in June 2013 showed no significant stenosis.  She had a NSTEMI in May 2011 in the setting of SIRS from leg cellulitis.  This was felt to be demand ischemia.  She also had atrial flutter with RVR in June 2014 however felt to be a poor candidate for atrial flutter ablation.  TEE DCCV was performed.  He obtained on 03/15/2013 showed EF 55 to 60%, no LA clot, positive PFO, mild to moderate TR.  She had a syncope episode in December 2014.  Implantable loop recorder demonstrated symptomatic bradycardia with pauses and she ultimately had a pacemaker placed.  Echocardiogram in August 2019 showed EF of 55 to 60% with mild to moderate AI.  Dr. Stanford Breed in the office in March 2020, she was felt to be volume overloaded.  Lasix was increased to 80 mg twice daily.  Metoprolol was discontinued as she managed to lose over 100 pound and blood pressure was soft.  By the time she was seen via virtual visit on 01/03/2019, she has managed to lose about 8 pounds.  Her diuretic was reduced back down to 40 mg twice daily.  She was seen by the A. fib clinic on 05/03/2019.  Remote device interrogation showed atrial flutter with RVR and she was subsequently referred to the A. fib clinic.  Blood pressure  was 88/67.  Heart rate of 124.  She was unable to get out of her chair without becoming presyncopal.  Patient was subsequently sent to the ED.  On arrival, she was noted to have AKI with creatinine 1.96.  Potassium 5.7.  She also claimed to have recurrent diarrhea as well.  Patient was cardioverted in the emergency room at the recommendation of cardiology DOD.  Post cardioversion, she remained hypotensive and was admitted for IV hydration.  Her hypotension was felt to be multifactorial related to diarrhea, poor oral intake and diuretic while in A. fib with RVR.  Renal function improved with hydration.  After discharge, she returned to the ED on 05/10/2019 with chest pain.  Chest x-ray showed mild CHF pattern without any pneumonia.  Symptom improved with GI cocktail.  Patient presents today for cardiology office visit.  Instead of 40 mg daily of Lasix and 20 mEq daily of potassium, she has been taking 40 mg a.m./20 mg p.m. of Lasix and 20 meq BID twice daily of potassium.  She appears to be euvolemic on physical exam.  I will obtain a basic metabolic panel to check her renal function and electrolyte today.  If her renal function and electrolyte is stable, she can continue on the current therapy.  Otherwise she has been compliant with Eliquis.  She has been having constant chest pain that is not going  away for the past several weeks.  This is the same chest pain she went to the ED for on 8/20.  High-sensitivity troponin was negative x2 despite the long duration of the chest pain.  The constant nature of the chest pain the last several weeks at the time make it unlikely to be cardiac in origin.  It does not worsen with deep inspiration, body rotation or palpation.  The chest pain does worsen with swallowing and with solid food.  She described as a constant burning sensation in the chest and also up the throat as well.  I suspect this is GI origin.  I recommend she follow-up with Dr. Hilarie Fredrickson her GI physician.  Otherwise  she does not have any lower extremity edema, orthopnea or PND on physical exam.  She previously had a soft blood pressure on 25 mg twice daily of metoprolol tartrate, although ideally it is recommended she has some degree of rate control to prevent recurrent A. fib, however I do not think her blood pressure can tolerate this.  I asked her to take metoprolol 12.5 mg on as-needed basis for heart rate greater than 120 at rest.   Past Medical History:  Diagnosis Date   Anemia    Asthma 06/13/2011   hx of   Atrial fibrillation (HCC)    Atrial flutter ([place])    Breast cancer ([place]) dx'd 2005   No BP check or stick in Left arm   CAD (coronary artery disease)    a. Nonobstructive CAD 2007 (EF 75%.  RCA  proximal 75% tubular, 25% prox LAD, 25% d1, 50% D2) at [facility]. b. NSTEMI 01/2010 in setting of respiratory failure, medical approach (not a good candidate for noninvasive eval)   Cellulitis    hx of cellulitis in left leg   Cervical radiculopathy 06/13/2011   COPD (chronic obstructive pulmonary disease) ([place])    on O2   Depression 06/13/2011   Diverticulosis    Esophageal dysmotility    Gout 06/13/2011   History of Clostridium difficile early dec 2015   no current diarrhea   History of home oxygen therapy    2 liters per nasal cannula all the time   Hx of dysfunctional uterine bleeding    last bleeding jan 2016, worked up for with d and c x 2 no cause found   Hyperlipidemia    Hypertension    Hypothyroidism    Impaired glucose tolerance 11/01/2012   Neuropathy 06/13/2011   Obesity hypoventilation syndrome ([place]) 06/13/2011   OSA on CPAP    Personal history of chemotherapy    Personal history of radiation therapy    PFO (patent foramen ovale)    Risk for falls    Syncope and collapse    Bradycardia with pauses. S/P pacemaker   Tubular adenoma of colon     Past Surgical History:  Procedure Laterality Date   BREAST BIOPSY     BREAST LUMPECTOMY Left 05/2004    CARDIOVERSION N/A 03/15/2013   Procedure: CARDIOVERSION;  Surgeon: Thayer Headings, MD;  Location: [facility] ENDOSCOPY;  Service: Cardiovascular;  Laterality: N/A;   CERVICAL BIOPSY  June 2013   at [facility] for Heavy  Bleeding   CERVICAL [place] SURGERY  2004 or 2005   have cadaver bones,, screws, and plates c 5 to c 6   COLONOSCOPY     COLONOSCOPY WITH PROPOFOL N/A 10/22/2014   Procedure: COLONOSCOPY WITH PROPOFOL;  Surgeon: Jerene Bears, MD;  Location: [facility] ENDOSCOPY;  Service: Gastroenterology;  Laterality: N/A;   DILATION AND CURETTAGE OF UTERUS     LAPAROSCOPIC APPENDECTOMY  03/29/2012   Procedure: APPENDECTOMY LAPAROSCOPIC;  Surgeon: Adin Hector, MD;  Location: [facility] ORS;  Service: General;  Laterality: N/A;   LOOP RECORDER EXPLANT  08/31/2013   Procedure: LOOP RECORDER EXPLANT;  Surgeon: Coralyn Mark, MD;  Location: [facility] CATH LAB;  Service: Cardiovascular;;   LOOP RECORDER IMPLANT  08-28-2013; 08-31-2013   MDT LinQ implanted by Dr Rayann Heman for syncope; explanted 08-31-2013 after sinus pauses identified   LOOP RECORDER IMPLANT N/A 08/28/2013   Procedure: LOOP RECORDER IMPLANT;  Surgeon: Coralyn Mark, MD;  Location: [facility] CATH LAB;  Service: Cardiovascular;  Laterality: N/A;   OVARIAN CYST REMOVAL     PACEMAKER INSERTION  08-31-2013   Biotronik Evera dual chamber pacemaker placed for neurocardiogenic syncope and sinus pauses by Dr Rayann Heman   PERMANENT PACEMAKER INSERTION N/A 08/31/2013   Procedure: PERMANENT PACEMAKER INSERTION;  Surgeon: Coralyn Mark, MD;  Location: [place] CATH LAB;  Service: Cardiovascular;  Laterality: N/A;   TEE WITHOUT CARDIOVERSION N/A 03/15/2013   Procedure: TRANSESOPHAGEAL ECHOCARDIOGRAM (TEE);  Surgeon: Thayer Headings, MD;  Location: [facility];  Service: Cardiovascular;  Laterality: N/A;    Current Medications: Outpatient Medications Prior to Visit  Medication Sig Dispense Refill   allopurinol (ZYLOPRIM) 300 MG tablet TAKE 1 TABLET DAILY (Patient taking differently:  Take 300 mg by mouth daily. ) 90 tablet 1   amitriptyline (ELAVIL) 100 MG tablet TAKE 1 TABLET BY MOUTH AT BEDTIME (Patient taking differently: Take 100 mg by mouth at bedtime. ) 30 tablet 2   apixaban (ELIQUIS) 5 MG TABS tablet Take 1 tablet (5 mg total) by mouth 2 (two) times daily. 42 tablet 0   atorvastatin (LIPITOR) 40 MG tablet TAKE 1 TABLET DAILY (Patient taking differently: Take 40 mg by mouth daily. ) 90 tablet 4   AZO-CRANBERRY PO Take 1 tablet by mouth daily as needed (urinary symptoms).      B Complex Vitamins (VITAMIN B COMPLEX PO) Take 1 tablet by mouth daily.     budesonide-formoterol (SYMBICORT) 160-4.5 MCG/ACT inhaler Inhale 2 puffs into the lungs 2 (two) times daily. 10.2 g 1   Cholecalciferol (VITAMIN D) 2000 UNITS tablet Take 2,000 Units by mouth at bedtime.     clonazePAM (KLONOPIN) 0.5 MG tablet Take 1 tablet (0.5 mg total) by mouth daily as needed for anxiety.     DULoxetine (CYMBALTA) 30 MG capsule TAKE 2 CAPSULES AT BEDTIME (Patient taking differently: Take 30 mg by mouth daily as needed. For nerve pain) 180 capsule 1   Ergocalciferol (VITAMIN D2) 50 MCG (2000 UT) TABS Take 1 tablet by mouth daily.     famotidine-calcium carbonate-magnesium hydroxide (PEPCID COMPLETE) 10-800-165 MG chewable tablet Chew 1 tablet by mouth daily as needed (reflux).     Ferrous Sulfate (IRON PO) Take 1 tablet by mouth every evening.     guaiFENesin (MUCINEX) 600 MG 12 hr tablet Take 600 mg by mouth 2 (two) times daily.     levothyroxine (SYNTHROID) 200 MCG tablet Take 1 tablet (200 mcg total) by mouth daily. 90 tablet 1   omeprazole (PRILOSEC) 40 MG capsule Take 1 capsule (40 mg total) by mouth daily. 30 capsule 0   OXYGEN 2lpm with CPAP at night and with exertion     PROAIR HFA 108 (90 Base) MCG/ACT inhaler Inhale 2 puffs into the lungs every 4 (four) hours as needed for wheezing or shortness of breath. 1 Inhaler  4   traMADol (ULTRAM) 50 MG tablet TAKE 1 TABLET BY MOUTH EVERY  8 HOURS IF NEEDED FOR PAIN (Patient taking differently: Take 50 mg by mouth daily as needed for moderate pain. ) 90 tablet 5   vitamin C (ASCORBIC ACID) 500 MG tablet Take 500 mg by mouth daily.      vitamin E 1000 UNIT capsule Take 1,000 Units by mouth daily.     furosemide (LASIX) 40 MG tablet Take 1 tablet (40 mg total) by mouth daily.     potassium chloride SA (K-DUR) 20 MEQ tablet 1 daily (Patient taking differently: Take 20 mEq by mouth daily. ) 180 tablet 3   No facility-administered medications prior to visit.      Allergies:   Ciprofloxacin, Cephalexin, Codeine, Dilaudid [hydromorphone], Lisinopril, Cleocin [clindamycin hcl], Doxycycline, and Penicillins   Social History   Socioeconomic History   Marital status: Divorced    Spouse name: Not on file   Number of children: 2   Years of education: Not on file   Highest education level: Not on file  Occupational History   Occupation: Retired  Scientist, product/process development strain: Not on file   Food insecurity    Worry: Not on file    Inability: Not on Lexicographer needs    Medical: Not on file    Non-medical: Not on file  Tobacco Use   Smoking status: Former Smoker    Packs/day: 1.00    Years: 20.00    Pack years: 20.00    Types: Cigarettes    Quit date: 09/20/1998    Years since quitting: 20.6   Smokeless tobacco: Never Used   Tobacco comment: 1ppd x 20 years  Substance and Sexual Activity   Alcohol use: Yes    Alcohol/week: 1.0 standard drinks    Types: 1 Glasses of wine per week    Comment: rare   Drug use: No   Sexual activity: Not Currently    Birth control/protection: Post-menopausal  Lifestyle   Physical activity    Days per week: Not on file    Minutes per session: Not on file   Stress: Not on file  Relationships   Social connections    Talks on phone: Not on file    Gets together: Not on file    Attends religious service: Not on file    Active member of club or  organization: Not on file    Attends meetings of clubs or organizations: Not on file    Relationship status: Not on file  Other Topics Concern   Not on file  Social History Narrative   Lives alone in an apartment on the first floor.   Has 2 children.  Retired Advertising copywriter.  Education: some college.      Family History:  The patient's family history includes Breast cancer in her maternal aunt; Breast cancer (age of onset: 89) in her sister; Breast cancer (age of onset: 44) in her cousin; Diabetes in her brother; Heart disease in her mother; Ovarian cancer in her sister and sister; Uterine cancer in her mother.   ROS:   Please see the history of present illness.    ROS All other systems reviewed and are negative.   PHYSICAL EXAM:   VS:  BP 128/71    Pulse (!) 112    Ht 5' 6"  (1.676 m)    Wt 263 lb (119.3 kg)    BMI 42.45 kg/m    GEN:  Well nourished, well developed, in no acute distress  HEENT: normal  Neck: no JVD, carotid bruits, or masses Cardiac: RRR; no murmurs, rubs, or gallops,no edema  Respiratory:  clear to auscultation bilaterally, normal work of breathing GI: soft, nontender, nondistended, + BS MS: no deformity or atrophy  Skin: warm and dry, no rash Neuro:  Alert and Oriented x 3, Strength and sensation are intact Psych: euthymic mood, full affect  Wt Readings from Last 3 Encounters:  05/25/19 263 lb (119.3 kg)  05/10/19 275 lb 1.6 oz (124.8 kg)  05/05/19 275 lb 1.6 oz (124.8 kg)      Studies/Labs Reviewed:   EKG:  EKG is ordered today.  The ekg ordered today demonstrates normal sinus rhythm with PVCs, no significant ST-T wave changes.  Recent Labs: 07/06/2018: ALT 17 05/03/2019: Magnesium 2.4; TSH 3.536 05/10/2019: Hemoglobin 10.3; Platelets 150 05/25/2019: BUN 19; Creatinine, Ser 1.05; Potassium 4.6; Sodium 148   Lipid Panel    Component Value Date/Time   CHOL 139 11/24/2017 1455   TRIG 137 11/24/2017 1455   HDL 48 11/24/2017 1455   CHOLHDL 2.9  11/24/2017 1455   CHOLHDL 4 05/12/2017 0919   VLDL 39.6 05/12/2017 0919   LDLCALC 64 11/24/2017 1455    Additional studies/ records that were reviewed today include:   Echo 05/09/2018 LV EF: 55% -   60% Study Conclusions  - Left ventricle: The cavity size was normal. Wall thickness was   normal. Systolic function was normal. The estimated ejection   fraction was in the range of 55% to 60%. Wall motion was normal;   there were no regional wall motion abnormalities. Doppler   parameters are consistent with abnormal left ventricular   relaxation (grade 1 diastolic dysfunction). Doppler parameters   are consistent with elevated mean left atrial filling pressure. - Aortic valve: There was mild to moderate regurgitation directed   centrally in the LVOT. - Mitral valve: Moderately calcified annulus. Moderately thickened   leaflets . There was mild to moderate regurgitation directed   centrally. - Left atrium: The atrium was mildly dilated. - Tricuspid valve: There was mild-moderate regurgitation directed   centrally. - Pulmonary arteries: Systolic pressure was mildly increased. PA   peak pressure: 44 mm Hg (S).    ASSESSMENT:    1. Chronic diastolic congestive heart failure ([place])   2. Essential hypertension   3. Paroxysmal atrial fibrillation (HCC)   4. AKI (acute kidney injury) ([place])   5. Atypical chest pain   6. Chronic obstructive pulmonary disease, unspecified COPD type ([place])   7. Pacemaker      PLAN:  In order of problems listed above:  1. Chronic diastolic heart failure: Euvolemic on physical exam.  Instead of taking 40 mg daily of Lasix and 20 mEq daily of potassium.  She has been taking 40 mg a.m. and 20 mg p.m. of Lasix and 20 meq twice daily of potassium.  She appears to be euvolemic on physical exam.  For the time being I continued her on this dose and will obtain a basic metabolic panel to make sure her renal function is stable.  2. Atypical chest pain: I suspect  this is GI in nature.  She described constant pain for weeks at the time.  It does not occur with exertion and worsen with swallowing or solid food.  Differential diagnoses include esophageal candidiasis and esophageal ulcer.  3. PAF: Continue Eliquis.  Recent cardioversion in the ED.  Her beta-blocker was discontinued earlier this year  due to soft blood pressure.  I recommended she take 12.63m metoprolol on a as needed basis for resting heart rate greater than 120.  She is maintaining sinus rhythm  4. History of symptomatic bradycardia s/p pacemaker: Followed by Dr. TLovena Le  5. AKI: Occurred in the setting of hypotension, diarrhea and A. fib with RVR.  This has improved.   6. Hypertension: Blood pressure stable  7. COPD: No acute exacerbation.    Medication Adjustments/Labs and Tests Ordered: Current medicines are reviewed at length with the patient today.  Concerns regarding medicines are outlined above.  Medication changes, Labs and Tests ordered today are listed in the Patient Instructions below. Patient Instructions  Medication Instructions:  Take Furosemide 40 mg--1 tablet (40 mg) in the morning, and 1/2 tab (20 mg) in the evening.  Take Potassium 20 mg twice daily.  Take Metoprolol 25 mg--take 1/2 tab (12.5 mg) as needed for heart rate above 120 bpm at rest.  If you need a refill on your cardiac medications before your next appointment, please call your pharmacy.   Lab work: Your physician recommends that you return for lab work today: BMET  If you have labs (blood work) drawn today and your tests are completely normal, you will receive your results only by:  MB[place](if you have MyChart) OR  A paper copy in the mail If you have any lab test that is abnormal or we need to change your treatment, we will call you to review the results.   Follow-Up: [facility] you and your health needs are our priority.  As part of our continuing mission to provide you with  exceptional heart care, we have created designated Provider Care Teams.  These Care Teams include your primary Cardiologist (physician) and Advanced Practice Providers (APPs -  Physician Assistants and Nurse Practitioners) who all work together to provide you with the care you need, when you need it. You will need a follow up appointment in 2-3 months.  Please call our office 2 months in advance to schedule this appointment.  You may see BKirk Ruths MD or one of the following Advanced Practice Providers on your designated Care Team:   LKerin Ransom PVermontKRoby Lofts PA-C  CGrant PA-C       Signed, [place] 05/25/2019 11:50 PM    [place]  [id number]: [phone] Fax: [phone]

## 2019-05-25 NOTE — Patient Instructions (Signed)
Medication Instructions:  Take Furosemide 40 mg--1 tablet (40 mg) in the morning, and 1/2 tab (20 mg) in the evening.  Take Potassium 20 mg twice daily.  Take Metoprolol 25 mg--take 1/2 tab (12.5 mg) as needed for heart rate above 120 bpm at rest.  If you need a refill on your cardiac medications before your next appointment, please call your pharmacy.   Lab work: Your physician recommends that you return for lab work today: BMET  If you have labs (blood work) drawn today and your tests are completely normal, you will receive your results only by: [name] MyChart Message (if you have MyChart) OR . A paper copy in the mail If you have any lab test that is abnormal or we need to change your treatment, we will call you to review the results.   Follow-Up: At [facility], you and your health needs are our priority.  As part of our continuing mission to provide you with exceptional heart care, we have created designated Provider Care Teams.  These Care Teams include your primary Cardiologist (physician) and Advanced Practice Providers (APPs -  Physician Assistants and Nurse Practitioners) who all work together to provide you with the care you need, when you need it. You will need a follow up appointment in 2-3 months.  Please call our office 2 months in advance to schedule this appointment.  You may see Kirk Ruths, MD or one of the following Advanced Practice Providers on your designated Care Team:   Kerin Ransom, PA-C Roby Lofts, [place] . Sande Rives, PA-C

## 2019-05-29 ENCOUNTER — Ambulatory Visit (INDEPENDENT_AMBULATORY_CARE_PROVIDER_SITE_OTHER): Payer: Medicare HMO | Admitting: Internal Medicine

## 2019-05-29 ENCOUNTER — Encounter: Payer: Self-pay | Admitting: Internal Medicine

## 2019-05-29 ENCOUNTER — Other Ambulatory Visit: Payer: Self-pay

## 2019-05-29 VITALS — BP 126/82 | HR 107 | Temp 98.6°F | Ht 66.0 in | Wt 261.0 lb

## 2019-05-29 DIAGNOSIS — I10 Essential (primary) hypertension: Secondary | ICD-10-CM

## 2019-05-29 DIAGNOSIS — R7302 Impaired glucose tolerance (oral): Secondary | ICD-10-CM

## 2019-05-29 DIAGNOSIS — F32A Depression, unspecified: Secondary | ICD-10-CM

## 2019-05-29 DIAGNOSIS — Z23 Encounter for immunization: Secondary | ICD-10-CM | POA: Diagnosis not present

## 2019-05-29 DIAGNOSIS — F329 Major depressive disorder, single episode, unspecified: Secondary | ICD-10-CM | POA: Diagnosis not present

## 2019-05-29 DIAGNOSIS — Z0001 Encounter for general adult medical examination with abnormal findings: Secondary | ICD-10-CM | POA: Diagnosis not present

## 2019-05-29 DIAGNOSIS — R0789 Other chest pain: Secondary | ICD-10-CM | POA: Diagnosis not present

## 2019-05-29 NOTE — Progress Notes (Signed)
Subjective:    Patient ID: [name] [name], female    DOB: 1946-05-16, 73 y.o.   MRN: [id number]  HPI  Here for wellness and f/u;  Overall doing ok;  Pt denies, worsening SOB, DOE, wheezing, orthopnea, PND, worsening LE edema, palpitations, dizziness or syncope, though has had significant issues recently now s/p cardioversion as documented for PAF.  Pt denies neurological change such as new headache, facial or extremity weakness.  Pt denies polydipsia, polyuria, or low sugar symptoms. Pt states overall good compliance with treatment and medications, good tolerability, and has been trying to follow appropriate diet.  Pt denies worsening depressive symptoms, suicidal ideation or panic. No fever, night sweats, wt loss, loss of appetite, or other constitutional symptoms.  Pt states good ability with ADL's, has low fall risk, home safety reviewed and adequate, no other significant changes in hearing or vision.  Did have an extreme bout of diarrhea recently and not sleeping due to this, and also then with mild AKI requiring IVFs. Wt Readings from Last 3 Encounters:  05/29/19 261 lb (118.4 kg)  05/25/19 263 lb (119.3 kg)  05/10/19 275 lb 1.6 oz (124.8 kg)   BP Readings from Last 3 Encounters:  05/29/19 126/82  05/25/19 128/71  05/10/19 113/78  Tries not to take controlled substances, but has taken pain med and anxiety med today as has been feeling overwhelmed, has some family stress as well.  Also with recent recurrent dull upper CP atypical scine the cardioversion, and pt has d/w cardiology felt to be non cardiac, is on Prilosec 40 but she is wondering about something GI vs other.  Nothing seems to make better or wrose, has been trying to lie more on the left side at night, and no pain as long as she sleeps that way.  Has tried pepcid per pt but seemed to make it worse.   Past Medical History:  Diagnosis Date   Anemia    Asthma 06/13/2011   hx of   Atrial fibrillation (HCC)    Atrial flutter ([place])     Breast cancer ([place]) dx'd 2005   No BP check or stick in Left arm   CAD (coronary artery disease)    a. Nonobstructive CAD 2007 (EF 75%.  RCA  proximal 75% tubular, 25% prox LAD, 25% d1, 50% D2) at [facility]. b. NSTEMI 01/2010 in setting of respiratory failure, medical approach (not a good candidate for noninvasive eval)   Cellulitis    hx of cellulitis in left leg   Cervical radiculopathy 06/13/2011   COPD (chronic obstructive pulmonary disease) ([place])    on O2   Depression 06/13/2011   Diverticulosis    Esophageal dysmotility    Gout 06/13/2011   History of Clostridium difficile early dec 2015   no current diarrhea   History of home oxygen therapy    2 liters per nasal cannula all the time   Hx of dysfunctional uterine bleeding    last bleeding jan 2016, worked up for with d and c x 2 no cause found   Hyperlipidemia    Hypertension    Hypothyroidism    Impaired glucose tolerance 11/01/2012   Neuropathy 06/13/2011   Obesity hypoventilation syndrome ([place]) 06/13/2011   OSA on CPAP    Personal history of chemotherapy    Personal history of radiation therapy    PFO (patent foramen ovale)    Risk for falls    Syncope and collapse    Bradycardia with pauses. S/P pacemaker  Tubular adenoma of colon    Past Surgical History:  Procedure Laterality Date   BREAST BIOPSY     BREAST LUMPECTOMY Left 05/2004   CARDIOVERSION N/A 03/15/2013   Procedure: CARDIOVERSION;  Surgeon: Thayer Headings, MD;  Location: [facility] ENDOSCOPY;  Service: Cardiovascular;  Laterality: N/A;   CERVICAL BIOPSY  June 2013   at [facility] for Heavy  Bleeding   CERVICAL [place] SURGERY  2004 or 2005   have cadaver bones,, screws, and plates c 5 to c 6   COLONOSCOPY     COLONOSCOPY WITH PROPOFOL N/A 10/22/2014   Procedure: COLONOSCOPY WITH PROPOFOL;  Surgeon: Jerene Bears, MD;  Location: [facility] ENDOSCOPY;  Service: Gastroenterology;  Laterality: N/A;   DILATION AND CURETTAGE OF UTERUS     LAPAROSCOPIC  APPENDECTOMY  03/29/2012   Procedure: APPENDECTOMY LAPAROSCOPIC;  Surgeon: Adin Hector, MD;  Location: [facility] ORS;  Service: General;  Laterality: N/A;   LOOP RECORDER EXPLANT  08/31/2013   Procedure: LOOP RECORDER EXPLANT;  Surgeon: Coralyn Mark, MD;  Location: [facility] CATH LAB;  Service: Cardiovascular;;   LOOP RECORDER IMPLANT  08-28-2013; 08-31-2013   MDT LinQ implanted by Dr Rayann Heman for syncope; explanted 08-31-2013 after sinus pauses identified   LOOP RECORDER IMPLANT N/A 08/28/2013   Procedure: LOOP RECORDER IMPLANT;  Surgeon: Coralyn Mark, MD;  Location: [facility] CATH LAB;  Service: Cardiovascular;  Laterality: N/A;   OVARIAN CYST REMOVAL     PACEMAKER INSERTION  08-31-2013   Biotronik Evera dual chamber pacemaker placed for neurocardiogenic syncope and sinus pauses by Dr Rayann Heman   PERMANENT PACEMAKER INSERTION N/A 08/31/2013   Procedure: PERMANENT PACEMAKER INSERTION;  Surgeon: Coralyn Mark, MD;  Location: [facility] CATH LAB;  Service: Cardiovascular;  Laterality: N/A;   TEE WITHOUT CARDIOVERSION N/A 03/15/2013   Procedure: TRANSESOPHAGEAL ECHOCARDIOGRAM (TEE);  Surgeon: Thayer Headings, MD;  Location: [facility] ENDOSCOPY;  Service: Cardiovascular;  Laterality: N/A;    reports that she quit smoking about 20 years ago. Her smoking use included cigarettes. She has a 20.00 pack-year smoking history. She has never used smokeless tobacco. She reports current alcohol use of about 1.0 standard drinks of alcohol per week. She reports that she does not use drugs. family history includes Breast cancer in her maternal aunt; Breast cancer (age of onset: 63) in her sister; Breast cancer (age of onset: 106) in her cousin; Diabetes in her brother; Heart disease in her mother; Ovarian cancer in her sister and sister; Uterine cancer in her mother. Allergies  Allergen Reactions   Ciprofloxacin Other (See Comments)    foliculitis   Cephalexin Other (See Comments)    Exfoliative dermatitis reaction   Codeine      REACTION: unknown - pt. states unaware of having reaction to codeine   Dilaudid [Hydromorphone] Other (See Comments)    Other Reaction: Oversedation   Lisinopril     REACTION: cough   Cleocin [Clindamycin Hcl] Rash    rash   Doxycycline Rash   Penicillins Rash    Did it involve swelling of the face/tongue/throat, SOB, or low BP? No Did it involve sudden or severe rash/hives, skin peeling, or any reaction on the inside of your mouth or nose? Yes Did you need to seek medical attention at a hospital or doctor's office?Yes When did it last happen?several years ago If all above answers are NO, may proceed with cephalosporin use.   Current Outpatient Medications on File Prior to Visit  Medication Sig Dispense Refill   allopurinol (ZYLOPRIM)  300 MG tablet TAKE 1 TABLET DAILY (Patient taking differently: Take 300 mg by mouth daily. ) 90 tablet 1   amitriptyline (ELAVIL) 100 MG tablet TAKE 1 TABLET BY MOUTH AT BEDTIME (Patient taking differently: Take 100 mg by mouth at bedtime. ) 30 tablet 2   apixaban (ELIQUIS) 5 MG TABS tablet Take 1 tablet (5 mg total) by mouth 2 (two) times daily. 42 tablet 0   atorvastatin (LIPITOR) 40 MG tablet TAKE 1 TABLET DAILY (Patient taking differently: Take 40 mg by mouth daily. ) 90 tablet 4   AZO-CRANBERRY PO Take 1 tablet by mouth daily as needed (urinary symptoms).      B Complex Vitamins (VITAMIN B COMPLEX PO) Take 1 tablet by mouth daily.     budesonide-formoterol (SYMBICORT) 160-4.5 MCG/ACT inhaler Inhale 2 puffs into the lungs 2 (two) times daily. 10.2 g 1   Cholecalciferol (VITAMIN D) 2000 UNITS tablet Take 2,000 Units by mouth at bedtime.     clonazePAM (KLONOPIN) 0.5 MG tablet Take 1 tablet (0.5 mg total) by mouth daily as needed for anxiety.     DULoxetine (CYMBALTA) 30 MG capsule TAKE 2 CAPSULES AT BEDTIME (Patient taking differently: Take 30 mg by mouth daily as needed. For nerve pain) 180 capsule 1   Ergocalciferol (VITAMIN D2)  50 MCG (2000 UT) TABS Take 1 tablet by mouth daily.     famotidine-calcium carbonate-magnesium hydroxide (PEPCID COMPLETE) 10-800-165 MG chewable tablet Chew 1 tablet by mouth daily as needed (reflux).     Ferrous Sulfate (IRON PO) Take 1 tablet by mouth every evening.     furosemide (LASIX) 40 MG tablet Take 40 mg (1 tab) by mouth in the morning. Take 20 mg (1/2 tab) in the evening. 45 tablet 3   guaiFENesin (MUCINEX) 600 MG 12 hr tablet Take 600 mg by mouth 2 (two) times daily.     levothyroxine (SYNTHROID) 200 MCG tablet Take 1 tablet (200 mcg total) by mouth daily. 90 tablet 1   metoprolol tartrate (LOPRESSOR) 25 MG tablet Take 0.5 tablets (12.5 mg total) by mouth as needed (for heart rate above 120 at rest). 30 tablet 3   omeprazole (PRILOSEC) 40 MG capsule Take 1 capsule (40 mg total) by mouth daily. 30 capsule 0   OXYGEN 2lpm with CPAP at night and with exertion     potassium chloride SA (K-DUR) 20 MEQ tablet Take 1 tablet (20 mEq total) by mouth 2 (two) times daily. 60 tablet 3   PROAIR HFA 108 (90 Base) MCG/ACT inhaler Inhale 2 puffs into the lungs every 4 (four) hours as needed for wheezing or shortness of breath. 1 Inhaler 4   traMADol (ULTRAM) 50 MG tablet TAKE 1 TABLET BY MOUTH EVERY 8 HOURS IF NEEDED FOR PAIN (Patient taking differently: Take 50 mg by mouth daily as needed for moderate pain. ) 90 tablet 5   vitamin C (ASCORBIC ACID) 500 MG tablet Take 500 mg by mouth daily.      vitamin E 1000 UNIT capsule Take 1,000 Units by mouth daily.     No current facility-administered medications on file prior to visit.    Review of Systems  Constitutional: Negative for other unusual diaphoresis or sweats HENT: Negative for ear discharge or swelling Eyes: Negative for other worsening visual disturbances Respiratory: Negative for stridor or other swelling  Gastrointestinal: Negative for worsening distension or other blood Genitourinary: Negative for retention or other urinary  change Musculoskeletal: Negative for other MSK pain or swelling Skin: Negative for  color change or other new lesions Neurological: Negative for worsening tremors and other numbness  Psychiatric/Behavioral: Negative for worsening agitation or other fatigue All other system neg per pt    Objective:   Physical Exam BP 126/82    Pulse (!) 107    Temp 98.6 F (37 C) (Oral)    Ht 5' 6"  (1.676 m)    Wt 261 lb (118.4 kg)    SpO2 92%    BMI 42.13 kg/m  VS noted,  Constitutional: Pt appears in NAD HENT: Head: NCAT.  Right Ear: External ear normal.  Left Ear: External ear normal.  Eyes: . Pupils are equal, round, and reactive to light. Conjunctivae and EOM are normal Nose: without d/c or deformity Neck: Neck supple. Gross normal ROM Cardiovascular: Normal rate and regular rhythm.   Pulmonary/Chest: Effort normal and breath sounds without rales or wheezing.  Abd:  Soft, NT, ND, + BS, no organomegaly Neurological: Pt is alert. At baseline orientation, motor grossly intact Skin: Skin is warm. No rashes, other new lesions, no LE edema Psychiatric: Pt behavior is normal without agitation , mild nervous No other exam findings  Lab Results  Component Value Date   WBC 6.7 05/10/2019   HGB 10.3 (L) 05/10/2019   HCT 34.9 (L) 05/10/2019   PLT 150 05/10/2019   GLUCOSE 77 05/25/2019   CHOL 139 11/24/2017   TRIG 137 11/24/2017   HDL 48 11/24/2017   LDLCALC 64 11/24/2017   ALT 17 07/06/2018   AST 16 07/06/2018   NA 148 (H) 05/25/2019   K 4.6 05/25/2019   CL 105 05/25/2019   CREATININE 1.05 (H) 05/25/2019   BUN 19 05/25/2019   CO2 25 05/25/2019   TSH 3.536 05/03/2019   INR 1.08 09/02/2016   HGBA1C 5.2 05/03/2019   decliens further labs today     Assessment & Plan:

## 2019-05-29 NOTE — Assessment & Plan Note (Signed)

## 2019-05-29 NOTE — Patient Instructions (Addendum)
You had the flu shot today  Please continue all other medications as before, and refills have been done if requested.  Please have the pharmacy call with any other refills you may need.  Please continue your efforts at being more active, low cholesterol diet, and weight control.  You are otherwise up to date with prevention measures today.  Please keep your appointments with your specialists as you may have planned  Please return in 6 months, or sooner if needed

## 2019-05-29 NOTE — Progress Notes (Signed)
Renal function has improved to baseline. Electrolyte is within normal limit with exception of mildly elevated sodium, recommend continue observation for now. Since renal function is stable, would continue current medication

## 2019-05-29 NOTE — Assessment & Plan Note (Signed)
stable overall by history and exam, recent data reviewed with pt, and pt to continue medical treatment as before,  to f/u any worsening symptoms or concerns  

## 2019-05-29 NOTE — Assessment & Plan Note (Addendum)
Atypical, etiology unclear, exam benign, declines further evaluation or tx  In addition to the time spent performing CPE, I spent an additional 25 minutes face to face,in which greater than 50% of this time was spent in counseling and coordination of care for patient's acute illness as documented, including the differential dx, treatment, further evaluation and other management of chest pain, depression, HTn, hyperglycemia

## 2019-05-29 NOTE — Assessment & Plan Note (Signed)
Improved,  BP Readings from Last 3 Encounters:  05/29/19 126/82  05/25/19 128/71  05/10/19 113/78  stable overall by history and exam, recent data reviewed with pt, and pt to continue medical treatment as before,  to f/u any worsening symptoms or concerns

## 2019-06-05 ENCOUNTER — Telehealth: Payer: Self-pay | Admitting: Internal Medicine

## 2019-06-05 MED ORDER — CLONAZEPAM 0.5 MG PO TABS: 0.5000 mg | ORAL_TABLET | Freq: Every day | ORAL | 1 refills | Status: DC | PRN

## 2019-06-05 NOTE — Telephone Encounter (Signed)
Done erx 

## 2019-06-05 NOTE — Telephone Encounter (Signed)
Pt called in to request to have Rx for   traMADol (ULTRAM) 50 MG tablet  clonazePAM (KLONOPIN) 0.5 MG tablet  Pharmacy:  [facility] [place], [place] [phone] (Phone) [phone] (Fax)   [phone]- [facility]

## 2019-06-05 NOTE — Addendum Note (Signed)
Addended by: Biagio Borg on: 06/05/2019 01:08 PM   Modules accepted: Orders

## 2019-06-07 ENCOUNTER — Telehealth: Payer: Self-pay

## 2019-06-07 MED ORDER — TRAMADOL HCL 50 MG PO TABS: 50.0000 mg | ORAL_TABLET | Freq: Every day | ORAL | 1 refills | Status: DC | PRN

## 2019-06-07 MED ORDER — CLONAZEPAM 0.5 MG PO TABS: 0.5000 mg | ORAL_TABLET | Freq: Every day | ORAL | 1 refills | Status: DC | PRN

## 2019-06-07 NOTE — Addendum Note (Signed)
Addended by: Biagio Borg on: 06/07/2019 12:55 PM   Modules accepted: Orders

## 2019-06-07 NOTE — Telephone Encounter (Signed)
Copied from [place] [phone]. Topic: General - Other >> Jun 07, 2019 11:06 AM Carolyn Stare wrote: Pt medication was sent to  [facility] instead of [facility] mail order  PLEASE RESEND    TRAMADOL and CLONAZEPAM

## 2019-06-07 NOTE — Telephone Encounter (Signed)
Done erx 

## 2019-06-10 DIAGNOSIS — J449 Chronic obstructive pulmonary disease, unspecified: Secondary | ICD-10-CM | POA: Diagnosis not present

## 2019-06-12 ENCOUNTER — Encounter: Payer: Self-pay | Admitting: Cardiology

## 2019-06-12 NOTE — Telephone Encounter (Signed)
New Message     *STAT* If patient is at the pharmacy, call can be transferred to refill team.   1. Which medications need to be refilled? (please list name of each medication and dose if known) Eliquis 12m  2. Which pharmacy/location (including street and city if local pharmacy) is medication to be sent to? [facility] mail order pharmacy   3. Do they need a 30 day or 90 day supply? 90 day supply

## 2019-06-13 ENCOUNTER — Other Ambulatory Visit: Payer: Self-pay

## 2019-06-13 ENCOUNTER — Other Ambulatory Visit: Payer: Self-pay | Admitting: Emergency Medicine

## 2019-06-13 MED ORDER — APIXABAN 5 MG PO TABS: 5.0000 mg | ORAL_TABLET | Freq: Two times a day (BID) | ORAL | 4 refills | Status: DC

## 2019-06-13 MED ORDER — AMITRIPTYLINE HCL 100 MG PO TABS: 100.0000 mg | ORAL_TABLET | Freq: Every day | ORAL | 0 refills | Status: DC

## 2019-06-13 MED ORDER — AMITRIPTYLINE HCL 100 MG PO TABS: 100.0000 mg | ORAL_TABLET | Freq: Every day | ORAL | 1 refills | Status: DC

## 2019-06-13 MED ORDER — AMITRIPTYLINE HCL 100 MG PO TABS: 100.0000 mg | ORAL_TABLET | Freq: Every day | ORAL | 1 refills | Status: AC

## 2019-06-13 MED ORDER — CLONAZEPAM 0.5 MG PO TABS: 0.5000 mg | ORAL_TABLET | Freq: Every day | ORAL | 1 refills | Status: DC | PRN

## 2019-06-13 MED ORDER — TRAMADOL HCL 50 MG PO TABS: 50.0000 mg | ORAL_TABLET | Freq: Every day | ORAL | 1 refills | Status: DC | PRN

## 2019-06-13 MED ORDER — APIXABAN 5 MG PO TABS: 5.0000 mg | ORAL_TABLET | Freq: Two times a day (BID) | ORAL | 1 refills | Status: DC

## 2019-06-13 NOTE — Telephone Encounter (Signed)
45f118.4kg Scr 1.05 05/25/19 Lovw/meng 05/25/19 This encounter was created in error - please disregard.

## 2019-06-13 NOTE — Telephone Encounter (Signed)
71f118.4kg Scr 1.05 05/25/19 Lovw/meng 05/25/19

## 2019-06-13 NOTE — Progress Notes (Signed)
Pt needs RXs sent to [facility] not local pharmacy. Please resend refills. I have Called [facility] to cancel RXs sent to them on 06/07/19  Pt needs a 10 day supply of amitriptyline sent to [facility].

## 2019-06-13 NOTE — Progress Notes (Signed)
Ok this is done 

## 2019-06-13 NOTE — Progress Notes (Signed)
Sorry no, these meds are not to be sent to mail in pharmacy as they are controlled substances and I do not send large quantities to mail in pharmacies

## 2019-06-13 NOTE — Addendum Note (Signed)
Addended by: Juliet Rude on: 06/13/2019 03:59 PM   Modules accepted: Orders

## 2019-06-13 NOTE — Addendum Note (Signed)
Addended by: Terence Lux B on: 06/13/2019 03:54 PM   Modules accepted: Orders

## 2019-06-13 NOTE — Progress Notes (Signed)
Please send the pended meds to the mail order. Thanks!

## 2019-06-18 NOTE — Progress Notes (Signed)
Pt has been informed of PCP response below. She expressed understanding.

## 2019-06-25 ENCOUNTER — Other Ambulatory Visit: Payer: Self-pay | Admitting: Internal Medicine

## 2019-06-25 ENCOUNTER — Telehealth: Payer: Self-pay | Admitting: Internal Medicine

## 2019-06-25 NOTE — Telephone Encounter (Signed)
traMADol (ULTRAM) 50 MG tablet  clonazePAM (KLONOPIN) 0.5 MG tablet  amitriptyline (ELAVIL) 100 MG tablet   Send to [facility]  These was sent to [facility] which is wrong

## 2019-06-25 NOTE — Telephone Encounter (Signed)
Amitriptyline previously sent to [facility] order pharmacy on 06/13/19#90 with 1 refill. See request for Tramadol and Clonazepam to be sent to [facility] instead of [facility].

## 2019-06-26 ENCOUNTER — Other Ambulatory Visit: Payer: Self-pay | Admitting: Cardiology

## 2019-06-26 MED ORDER — TRAMADOL HCL 50 MG PO TABS: 50.0000 mg | ORAL_TABLET | Freq: Every day | ORAL | 1 refills | Status: DC | PRN

## 2019-06-26 MED ORDER — APIXABAN 5 MG PO TABS: 5.0000 mg | ORAL_TABLET | Freq: Two times a day (BID) | ORAL | 1 refills | Status: DC

## 2019-06-26 MED ORDER — CLONAZEPAM 0.5 MG PO TABS: 0.5000 mg | ORAL_TABLET | Freq: Every day | ORAL | 1 refills | Status: DC | PRN

## 2019-06-26 NOTE — Telephone Encounter (Signed)
Scr 1.05 05/25/19  Lovw/meng 05/25/19

## 2019-06-26 NOTE — Telephone Encounter (Signed)
Pt calling requesting a 10 day supply of Eliquis 5 mg tablet be sent to local pharmacy [facility] on [address] and a 90 day supply with refills sent to [facility] mail order pharmacy. Please address

## 2019-06-26 NOTE — Telephone Encounter (Signed)
Done erx 

## 2019-06-26 NOTE — Telephone Encounter (Signed)
88f118.4kg Scr

## 2019-06-27 ENCOUNTER — Telehealth: Payer: Self-pay | Admitting: Cardiology

## 2019-06-27 ENCOUNTER — Other Ambulatory Visit: Payer: Self-pay

## 2019-06-27 MED ORDER — APIXABAN 5 MG PO TABS: 5.0000 mg | ORAL_TABLET | Freq: Two times a day (BID) | ORAL | 1 refills | Status: DC

## 2019-06-27 NOTE — Telephone Encounter (Signed)
Patient called stating that [facility] never received her prescription request.

## 2019-06-27 NOTE — Telephone Encounter (Signed)
Resubmitted Eliquis to [facility], patient aware

## 2019-07-04 NOTE — Progress Notes (Signed)
[name] [name]   [phone] [phone]   Clinic Follow up Note   Patient Care Team: Biagio Borg, MD as PCP - General (Internal Medicine) Stanford Breed Denice Bors, MD as PCP - Cardiology (Cardiology)  Date of Service:  07/09/2019  CHIEF COMPLAINT: F/u of H/o left breast cancer  CURRENT THERAPY:  Surveillance  INTERVAL HISTORY:  Rich Number is here for a follow up of left breast cancer. She was last seen by me 1 year ago. She presents to the clinic alone. She notes she is doing fair. She notes left lateral breast swelling which she noticed 6-8 weeks ago. She notes this can be painful when wearing a bra. She denies any injury to this area. She notes she has CHF and has been retaining fluid in her body. She has gained weight since last visit. She notes she had a abdominal hernia. She notes she had it protruding and went to ED to be put back. She feels her memory is not as sharp as it was before, she attributes this to her aging.   REVIEW OF SYSTEMS:   Constitutional: Denies fevers, chills or abnormal weight loss Eyes: Denies blurriness of vision Ears, nose, mouth, throat, and face: Denies mucositis or sore throat Respiratory: Denies cough, dyspnea or wheezes Cardiovascular: Denies palpitation, chest discomfort or lower extremity swelling Gastrointestinal:  Denies nausea, heartburn or change in bowel habits (+) Abdominal hernia  Skin: Denies abnormal skin rashes Lymphatics: Denies new lymphadenopathy or easy bruising Neurological:Denies numbness, tingling or new weaknesses Behavioral/Psych: Mood is stable, no new changes  Breast: (+) Left lateral breast swelling  All other systems were reviewed with the patient and are negative.  MEDICAL HISTORY:  Past Medical History:  Diagnosis Date   Anemia    Asthma 06/13/2011   hx of   Atrial fibrillation (HCC)    Atrial flutter ([place])    Breast cancer ([place]) dx'd 2005   No BP check or stick in Left arm    CAD (coronary artery disease)    a. Nonobstructive CAD 2007 (EF 75%.  RCA  proximal 75% tubular, 25% prox LAD, 25% d1, 50% D2) at [facility]. b. NSTEMI 01/2010 in setting of respiratory failure, medical approach (not a good candidate for noninvasive eval)   Cellulitis    hx of cellulitis in left leg   Cervical radiculopathy 06/13/2011   COPD (chronic obstructive pulmonary disease) ([place])    on O2   Depression 06/13/2011   Diverticulosis    Esophageal dysmotility    Gout 06/13/2011   History of Clostridium difficile early dec 2015   no current diarrhea   History of home oxygen therapy    2 liters per nasal cannula all the time   Hx of dysfunctional uterine bleeding    last bleeding jan 2016, worked up for with d and c x 2 no cause found   Hyperlipidemia    Hypertension    Hypothyroidism    Impaired glucose tolerance 11/01/2012   Neuropathy 06/13/2011   Obesity hypoventilation syndrome ([place]) 06/13/2011   OSA on CPAP    Personal history of chemotherapy    Personal history of radiation therapy    PFO (patent foramen ovale)    Risk for falls    Syncope and collapse    Bradycardia with pauses. S/P pacemaker   Tubular adenoma of colon     SURGICAL HISTORY: Past Surgical History:  Procedure Laterality Date   BREAST BIOPSY     BREAST LUMPECTOMY Left  05/2004   CARDIOVERSION N/A 03/15/2013   Procedure: CARDIOVERSION;  Surgeon: Thayer Headings, MD;  Location: [facility] ENDOSCOPY;  Service: Cardiovascular;  Laterality: N/A;   CERVICAL BIOPSY  June 2013   at [facility] for Heavy  Bleeding   CERVICAL [place] SURGERY  2004 or 2005   have cadaver bones,, screws, and plates c 5 to c 6   COLONOSCOPY     COLONOSCOPY WITH PROPOFOL N/A 10/22/2014   Procedure: COLONOSCOPY WITH PROPOFOL;  Surgeon: Jerene Bears, MD;  Location: [facility] ENDOSCOPY;  Service: Gastroenterology;  Laterality: N/A;   DILATION AND CURETTAGE OF UTERUS     LAPAROSCOPIC APPENDECTOMY  03/29/2012   Procedure: APPENDECTOMY  LAPAROSCOPIC;  Surgeon: Adin Hector, MD;  Location: [facility] ORS;  Service: General;  Laterality: N/A;   LOOP RECORDER EXPLANT  08/31/2013   Procedure: LOOP RECORDER EXPLANT;  Surgeon: Coralyn Mark, MD;  Location: [facility] CATH LAB;  Service: Cardiovascular;;   LOOP RECORDER IMPLANT  08-28-2013; 08-31-2013   MDT LinQ implanted by Dr Rayann Heman for syncope; explanted 08-31-2013 after sinus pauses identified   LOOP RECORDER IMPLANT N/A 08/28/2013   Procedure: LOOP RECORDER IMPLANT;  Surgeon: Coralyn Mark, MD;  Location: [facility] CATH LAB;  Service: Cardiovascular;  Laterality: N/A;   OVARIAN CYST REMOVAL     PACEMAKER INSERTION  08-31-2013   Biotronik Evera dual chamber pacemaker placed for neurocardiogenic syncope and sinus pauses by Dr Rayann Heman   PERMANENT PACEMAKER INSERTION N/A 08/31/2013   Procedure: PERMANENT PACEMAKER INSERTION;  Surgeon: Coralyn Mark, MD;  Location: [facility] CATH LAB;  Service: Cardiovascular;  Laterality: N/A;   TEE WITHOUT CARDIOVERSION N/A 03/15/2013   Procedure: TRANSESOPHAGEAL ECHOCARDIOGRAM (TEE);  Surgeon: Thayer Headings, MD;  Location[facility] ENDOSCOPY;  Service: Cardiovascular;  Laterality: N/A;    I have reviewed the social history and family history with the patient and they are unchanged from previous note.  ALLERGIES:  is allergic to ciprofloxacin; cephalexin; codeine; dilaudid [hydromorphone]; lisinopril; cleocin [clindamycin hcl]; doxycycline; and penicillins.  MEDICATIONS:  Current Outpatient Medications  Medication Sig Dispense Refill   allopurinol (ZYLOPRIM) 300 MG tablet TAKE 1 TABLET DAILY (Patient taking differently: Take 300 mg by mouth daily. ) 90 tablet 1   amitriptyline (ELAVIL) 100 MG tablet Take 1 tablet (100 mg total) by mouth at bedtime. 90 tablet 1   apixaban (ELIQUIS) 5 MG TABS tablet Take 1 tablet (5 mg total) by mouth 2 (two) times daily. 180 tablet 1   atorvastatin (LIPITOR) 40 MG tablet TAKE 1 TABLET DAILY (Patient taking differently: Take 40 mg by  mouth daily. ) 90 tablet 4   AZO-CRANBERRY PO Take 1 tablet by mouth daily as needed (urinary symptoms).      B Complex Vitamins (VITAMIN B COMPLEX PO) Take 1 tablet by mouth daily.     budesonide-formoterol (SYMBICORT) 160-4.5 MCG/ACT inhaler Inhale 2 puffs into the lungs 2 (two) times daily. 10.2 g 1   Cholecalciferol (VITAMIN D) 2000 UNITS tablet Take 2,000 Units by mouth at bedtime.     clonazePAM (KLONOPIN) 0.5 MG tablet Take 1 tablet (0.5 mg total) by mouth daily as needed for anxiety. 90 tablet 1   DULoxetine (CYMBALTA) 30 MG capsule TAKE 2 CAPSULES AT BEDTIME (Patient taking differently: Take 30 mg by mouth daily as needed. For nerve pain) 180 capsule 1   Ergocalciferol (VITAMIN D2) 50 MCG (2000 UT) TABS Take 1 tablet by mouth daily.     famotidine-calcium carbonate-magnesium hydroxide (PEPCID COMPLETE) 10-800-165 MG chewable tablet Chew  1 tablet by mouth daily as needed (reflux).     Ferrous Sulfate (IRON PO) Take 1 tablet by mouth every evening.     furosemide (LASIX) 40 MG tablet Take 40 mg (1 tab) by mouth in the morning. Take 20 mg (1/2 tab) in the evening. 45 tablet 3   guaiFENesin (MUCINEX) 600 MG 12 hr tablet Take 600 mg by mouth 2 (two) times daily.     levothyroxine (SYNTHROID) 200 MCG tablet Take 1 tablet (200 mcg total) by mouth daily. 90 tablet 1   metoprolol tartrate (LOPRESSOR) 25 MG tablet Take 0.5 tablets (12.5 mg total) by mouth as needed (for heart rate above 120 at rest). 30 tablet 3   omeprazole (PRILOSEC) 40 MG capsule Take 1 capsule (40 mg total) by mouth daily. 30 capsule 0   OXYGEN 2lpm with CPAP at night and with exertion     potassium chloride SA (K-DUR) 20 MEQ tablet Take 1 tablet (20 mEq total) by mouth 2 (two) times daily. 60 tablet 3   PROAIR HFA 108 (90 Base) MCG/ACT inhaler Inhale 2 puffs into the lungs every 4 (four) hours as needed for wheezing or shortness of breath. 1 Inhaler 4   traMADol (ULTRAM) 50 MG tablet Take 1 tablet (50 mg  total) by mouth daily as needed for moderate pain. 90 tablet 1   vitamin C (ASCORBIC ACID) 500 MG tablet Take 500 mg by mouth daily.      vitamin E 1000 UNIT capsule Take 1,000 Units by mouth daily.     No current facility-administered medications for this visit.     PHYSICAL EXAMINATION: ECOG PERFORMANCE STATUS: 3 - Symptomatic, >50% confined to bed  Vitals:   07/09/19 0901  BP: 130/60  Pulse: 98  Resp: 18  Temp: 98.9 F (37.2 C)  SpO2: 94%   Filed Weights   07/09/19 0901  Weight: 293 lb 4.8 oz (133 kg)    GENERAL:alert, no distress and comfortable SKIN: skin color, texture, turgor are normal, no rashes or significant lesions EYES: normal, Conjunctiva are pink and non-injected, sclera clear  NECK: supple, thyroid normal size, non-tender, without nodularity LYMPH:  no palpable lymphadenopathy in the cervical, axillary  LUNGS: clear to auscultation and percussion with normal breathing effort HEART: regular rate & rhythm and no murmurs (+) B/l lower extremity edema ABDOMEN:abdomen soft, non-tender and normal bowel sounds Musculoskeletal:no cyanosis of digits and no clubbing  NEURO: alert & oriented x 3 with fluent speech, no focal motor/sensory deficits BREAST: S/p left lumpectomy: Surgical incision healed well (+) Left breast lymphedema (+) Soft tissue swelling of left lateral breast. No palpable mass, nodules or adenopathy bilaterally. Breast exam benign.   LABORATORY DATA:  I have reviewed the data as listed CBC Latest Ref Rng & Units 07/09/2019 05/10/2019 05/04/2019  WBC 4.0 - 10.5 K/uL 4.4 6.7 8.4  Hemoglobin 12.0 - 15.0 g/dL 10.4(L) 10.3(L) 10.9(L)  Hematocrit 36.0 - 46.0 % 34.7(L) 34.9(L) 35.6(L)  Platelets 150 - 400 K/uL 125(L) 150 151     CMP Latest Ref Rng & Units 07/09/2019 05/25/2019 05/10/2019  Glucose 70 - 99 mg/dL 84 77 92  BUN 8 - 23 mg/dL 15 19 27(H)  Creatinine 0.44 - 1.00 mg/dL 0.98 1.05(H) 1.15(H)  Sodium 135 - 145 mmol/L 144 148(H) 144  Potassium 3.5  - 5.1 mmol/L 4.4 4.6 4.3  Chloride 98 - 111 mmol/L 107 105 104  CO2 22 - 32 mmol/L 29 25 32  Calcium 8.9 - 10.3 mg/dL 9.1 9.4  9.4  Total Protein 6.5 - 8.1 g/dL 6.5 - -  Total Bilirubin 0.3 - 1.2 mg/dL 0.7 - -  Alkaline Phos 38 - 126 U/L 133(H) - -  AST 15 - 41 U/L 27 - -  ALT 0 - 44 U/L 36 - -      RADIOGRAPHIC STUDIES: I have personally reviewed the radiological images as listed and agreed with the findings in the report. No results found.   ASSESSMENT & PLAN:  BURGUNDY MATUSZAK is a 73 y.o. female with   1. T2N0, stage IIA triple negative infiltrating ductal carcinoma of the left breast (2006).  -She is now over 14 years from the time of diagnosis without evidence of disease.  -We discussed that the risk of cancer recurrence was triple-negative breast cancer is minimal now.  -She is clinically doing well. Lab reviewed, her CBC and CMP are within normal limits except Hg 10.4, Plt 125K, albumin 3.2, alk phos 133. Her physical exam shows left breast lymphedema and soft tissue swelling on left lateral chest wall next to breast, likely soft tissue edema, no palpable mass. Her 09/2017 mammogram shows benign calcification of left breast and sebaceous cyst of right breast. There is no clinical concern for recurrence. -I have low suspicion her left breast/lateral chest wall edema are related to cancer, but will add Left breast US to mammogram to better evaluate. She is agreeable. She will also continue to f/u with her Cardiologist and medications.  -I encouraged her to pillow on her left back side to help her swelling.  -Continue Surveillance, she is overdue for mammogram, will order and left breast US ASAP.  -F/u in 6 months     2. Left breast calcification -This was found on the screening mammogram in September 2016, and diagnostic mammogram, stable in subsequent mammograms. -Not palpable on exam today (07/09/19)  3. Chronic low back pain, atrial fibrillation, CAD, COPD on home oxygen,  morbid obesity,  -She will continue follow-up with her primary care physician, cardiologist and pulmonologist -She has lost over 100 pounds in 2019 and her back pain has resolved.  -She has had more fluid retention with noticeable increased weight lately due to her CHF. She has been using Lasix which has reduced this some.   -She is currently back on supplemental oxygen.   4. Osteopenia  -DEXA 11/2016 revealed T-score -1.2 at right femur neck, indicating osteopenia -will repeat DEXA scan next year   5. Anemia and thrombocytopenia  -Her anemia has slightly worsened lately and today's CBC showed mid thrombocytopenia. These are likely related to her comorbidities.  -I encouraged her to watch for skin bruising and bleeding. She can contact me if this occurs.  -She will continue oral iron once a day. Will check her iron level, B12 and folate at next visit.  -repeat labs in 6 months    Plan  -Lab and f/u in 6 months, including anemia workup  -Mammogram and Left breast US ASAP    No problem-specific Assessment & Plan notes found for this encounter.   Orders Placed This Encounter  Procedures   Ferritin    Standing Status:   Future    Standing Expiration Date:   07/08/2020   Iron and TIBC    Standing Status:   Future    Standing Expiration Date:   07/08/2020   Folate RBC    Standing Status:   Future    Standing Expiration Date:   07/08/2020   Vitamin B12  Standing Status:   Future    Standing Expiration Date:   07/08/2020   All questions were answered. The patient knows to call the clinic with any problems, questions or concerns. No barriers to learning was detected. I spent 15 minutes counseling the patient face to face. The total time spent in the appointment was 20 minutes and more than 50% was on counseling and review of test results     Truitt Merle, MD 07/09/2019   I, Joslyn Devon, am acting as scribe for Truitt Merle, MD.   I have reviewed the above documentation for  accuracy and completeness, and I agree with the above.

## 2019-07-09 ENCOUNTER — Encounter: Payer: Self-pay | Admitting: Hematology

## 2019-07-09 ENCOUNTER — Inpatient Hospital Stay (HOSPITAL_BASED_OUTPATIENT_CLINIC_OR_DEPARTMENT_OTHER): Payer: Medicare HMO | Admitting: Hematology

## 2019-07-09 ENCOUNTER — Other Ambulatory Visit: Payer: Self-pay

## 2019-07-09 ENCOUNTER — Inpatient Hospital Stay: Payer: Medicare HMO | Attending: Hematology

## 2019-07-09 VITALS — BP 130/60 | HR 98 | Temp 98.9°F | Resp 18 | Ht 62.0 in | Wt 293.3 lb

## 2019-07-09 DIAGNOSIS — D649 Anemia, unspecified: Secondary | ICD-10-CM

## 2019-07-09 DIAGNOSIS — Z853 Personal history of malignant neoplasm of breast: Secondary | ICD-10-CM

## 2019-07-09 DIAGNOSIS — R600 Localized edema: Secondary | ICD-10-CM | POA: Insufficient documentation

## 2019-07-09 LAB — CBC WITH DIFFERENTIAL/PLATELET
Abs Immature Granulocytes: 0.01 10*3/uL (ref 0.00–0.07)
Basophils Absolute: 0 10*3/uL (ref 0.0–0.1)
Basophils Relative: 1 %
Eosinophils Absolute: 0.1 10*3/uL (ref 0.0–0.5)
Eosinophils Relative: 3 %
HCT: 34.7 % — ABNORMAL LOW (ref 36.0–46.0)
Hemoglobin: 10.4 g/dL — ABNORMAL LOW (ref 12.0–15.0)
Immature Granulocytes: 0 %
Lymphocytes Relative: 15 %
Lymphs Abs: 0.7 10*3/uL (ref 0.7–4.0)
MCH: 28 pg (ref 26.0–34.0)
MCHC: 30 g/dL (ref 30.0–36.0)
MCV: 93.5 fL (ref 80.0–100.0)
Monocytes Absolute: 0.3 10*3/uL (ref 0.1–1.0)
Monocytes Relative: 7 %
Neutro Abs: 3.3 10*3/uL (ref 1.7–7.7)
Neutrophils Relative %: 74 %
Platelets: 125 10*3/uL — ABNORMAL LOW (ref 150–400)
RBC: 3.71 MIL/uL — ABNORMAL LOW (ref 3.87–5.11)
RDW: 16.1 % — ABNORMAL HIGH (ref 11.5–15.5)
WBC: 4.4 10*3/uL (ref 4.0–10.5)
nRBC: 0 % (ref 0.0–0.2)

## 2019-07-09 LAB — COMPREHENSIVE METABOLIC PANEL
ALT: 36 U/L (ref 0–44)
AST: 27 U/L (ref 15–41)
Albumin: 3.2 g/dL — ABNORMAL LOW (ref 3.5–5.0)
Alkaline Phosphatase: 133 U/L — ABNORMAL HIGH (ref 38–126)
Anion gap: 8 (ref 5–15)
BUN: 15 mg/dL (ref 8–23)
CO2: 29 mmol/L (ref 22–32)
Calcium: 9.1 mg/dL (ref 8.9–10.3)
Chloride: 107 mmol/L (ref 98–111)
Creatinine, Ser: 0.98 mg/dL (ref 0.44–1.00)
GFR calc Af Amer: >60 mL/min (ref >60)
GFR calc non Af Amer: 58 mL/min — ABNORMAL LOW (ref >60)
Glucose, Bld: 84 mg/dL (ref 70–99)
Potassium: 4.4 mmol/L (ref 3.5–5.1)
Sodium: 144 mmol/L (ref 135–145)
Total Bilirubin: 0.7 mg/dL (ref 0.3–1.2)
Total Protein: 6.5 g/dL (ref 6.5–8.1)

## 2019-07-10 ENCOUNTER — Telehealth: Payer: Self-pay | Admitting: Hematology

## 2019-07-10 DIAGNOSIS — J449 Chronic obstructive pulmonary disease, unspecified: Secondary | ICD-10-CM | POA: Diagnosis not present

## 2019-07-10 NOTE — Telephone Encounter (Signed)
Scheduled appt per 10/19 los.  Left a VM of the appt date and time.

## 2019-07-11 ENCOUNTER — Telehealth: Payer: Self-pay | Admitting: *Deleted

## 2019-07-11 NOTE — Telephone Encounter (Signed)
LM on identified VM advising that home monitor has been disconnected for 21 days. Requested that patient plug monitor back in. Gave direct DC phone number and office hours if she has any questions or concerns.

## 2019-08-01 DIAGNOSIS — I10 Essential (primary) hypertension: Secondary | ICD-10-CM | POA: Diagnosis not present

## 2019-08-01 DIAGNOSIS — G4733 Obstructive sleep apnea (adult) (pediatric): Secondary | ICD-10-CM | POA: Diagnosis not present

## 2019-08-06 ENCOUNTER — Ambulatory Visit (INDEPENDENT_AMBULATORY_CARE_PROVIDER_SITE_OTHER): Payer: Medicare HMO | Admitting: *Deleted

## 2019-08-06 DIAGNOSIS — I495 Sick sinus syndrome: Secondary | ICD-10-CM

## 2019-08-06 DIAGNOSIS — I480 Paroxysmal atrial fibrillation: Secondary | ICD-10-CM | POA: Diagnosis not present

## 2019-08-07 LAB — CUP PACEART REMOTE DEVICE CHECK
Date Time Interrogation Session: 20201117083744
Implantable Lead Implant Date: 20141212
Implantable Lead Implant Date: 20141212
Implantable Lead Location: 753859
Implantable Lead Location: 753860
Implantable Lead Model: 346
Implantable Lead Model: 350
Implantable Lead Serial Number: 29409274
Implantable Lead Serial Number: 29535412
Implantable Pulse Generator Implant Date: 20141212
Pulse Gen Serial Number: 68181424

## 2019-08-09 ENCOUNTER — Encounter: Payer: Self-pay | Admitting: *Deleted

## 2019-08-10 DIAGNOSIS — J449 Chronic obstructive pulmonary disease, unspecified: Secondary | ICD-10-CM | POA: Diagnosis not present

## 2019-08-12 ENCOUNTER — Encounter: Payer: Self-pay | Admitting: Internal Medicine

## 2019-08-13 MED ORDER — DULOXETINE HCL 30 MG PO CPEP: 60.0000 mg | ORAL_CAPSULE | Freq: Every day | ORAL | 1 refills | Status: DC

## 2019-08-13 MED ORDER — POTASSIUM CHLORIDE CRYS ER 20 MEQ PO TBCR: 20.0000 meq | EXTENDED_RELEASE_TABLET | Freq: Two times a day (BID) | ORAL | 3 refills | Status: DC

## 2019-08-13 MED ORDER — FUROSEMIDE 40 MG PO TABS: ORAL_TABLET | ORAL | 3 refills | Status: DC

## 2019-08-28 ENCOUNTER — Ambulatory Visit: Payer: Medicare HMO | Admitting: Cardiology

## 2019-08-31 ENCOUNTER — Telehealth: Payer: Self-pay | Admitting: Internal Medicine

## 2019-08-31 NOTE — Progress Notes (Signed)
Remote pacemaker transmission.   

## 2019-08-31 NOTE — Telephone Encounter (Signed)
Left message for pt to call back  °

## 2019-09-04 NOTE — Telephone Encounter (Signed)
Left message for pt that she should contact [facility] at [phone] for one of the surgeons to evaluate her hernia.

## 2019-09-07 ENCOUNTER — Telehealth: Payer: Self-pay | Admitting: Internal Medicine

## 2019-09-07 NOTE — Telephone Encounter (Signed)
Copied from [place] [phone] [phone]. Topic: Quick Communication - Rx Refill/Question >> Sep 07, 2019  3:25 PM Leward Quan A wrote: Medication: apixaban (ELIQUIS) 5 MG TABS tablet, clonazePAM (KLONOPIN) 0.5 MG tablet, traMADol (ULTRAM) 50 MG tablet   Need 90 day supply  Patient would like all her maintenance medications going to [place] the patient contacted their pharmacy? Yes.   (Agent: If no, request that the patient contact the pharmacy for the refill.) (Agent: If yes, when and what did the pharmacy advise?)  Preferred Pharmacy (with phone number or street name): [place], [place]  Phone: [phone] Fax:  [phone]     Agent: Please be advised that RX refills may take up to 3 business days. We ask that you follow-up with your pharmacy.

## 2019-09-09 DIAGNOSIS — J449 Chronic obstructive pulmonary disease, unspecified: Secondary | ICD-10-CM | POA: Diagnosis not present

## 2019-09-12 ENCOUNTER — Encounter: Payer: Self-pay | Admitting: Internal Medicine

## 2019-09-12 DIAGNOSIS — K429 Umbilical hernia without obstruction or gangrene: Secondary | ICD-10-CM

## 2019-09-28 ENCOUNTER — Other Ambulatory Visit: Payer: Self-pay

## 2019-09-28 MED ORDER — METOPROLOL TARTRATE 25 MG PO TABS: 12.5000 mg | ORAL_TABLET | ORAL | 3 refills | Status: AC | PRN

## 2019-10-01 NOTE — Telephone Encounter (Signed)
Pt states that [employer] has shipped this, but pt hasn't gotten it.  Pt wants to know what to do.  Pt states that [facility] states it has been shipped.  States that [facility] can't give her tracking information or tell her when it shipped.  Pt wants to know if this can now be called into a local pharmacy because she has been without for a week now and is experiencing lots of pain.

## 2019-10-02 ENCOUNTER — Other Ambulatory Visit: Payer: Self-pay | Admitting: Internal Medicine

## 2019-10-02 MED ORDER — APIXABAN 5 MG PO TABS: 5.0000 mg | ORAL_TABLET | Freq: Two times a day (BID) | ORAL | 1 refills | Status: DC

## 2019-10-02 MED ORDER — APIXABAN 5 MG PO TABS: 5.0000 mg | ORAL_TABLET | Freq: Two times a day (BID) | ORAL | 1 refills | Status: AC

## 2019-10-02 MED ORDER — TRAMADOL HCL 50 MG PO TABS: 50.0000 mg | ORAL_TABLET | Freq: Every day | ORAL | 1 refills | Status: AC | PRN

## 2019-10-02 MED ORDER — CLONAZEPAM 0.5 MG PO TABS: 0.5000 mg | ORAL_TABLET | Freq: Every day | ORAL | 1 refills | Status: DC | PRN

## 2019-10-02 NOTE — Telephone Encounter (Signed)
Patient wanted medication sent to [facility] instead of [facility]

## 2019-10-02 NOTE — Telephone Encounter (Signed)
Pt likely did not get rx from [place] since there are no further refills  I refilled all 3 to [facility]

## 2019-10-02 NOTE — Telephone Encounter (Signed)
Notified pt w/MD response. Pt sates she just [facility], and they told her the medication will be $100 something dollars and she does not have any money. Pt is not understanding why she has not received order from [facility]. Pt is wanting rx's to be cancel at [facility], and sent to [facility]. Called [facility] canceled scripts that was sent.../lmb./lmb

## 2019-10-02 NOTE — Telephone Encounter (Signed)
Copied from [place] [phone]. Topic: Quick Communication - Rx Refill/Question >> Oct 02, 2019  3:03 PM [name] [name] A wrote: Medication: apixaban (ELIQUIS) 5 MG TABS tablet  Has the patient contacted their pharmacy? Yes (Agent: If no, request that the patient contact the pharmacy for the refill.) (Agent: If yes, when and what did the pharmacy advise?)Contact PCP  Preferred Pharmacy (with phone number or street name): [place], [place]  Phone:  [phone] Fax:  [phone]     Agent: Please be advised that RX refills may take up to 3 business days. We ask that you follow-up with your pharmacy.

## 2019-10-02 NOTE — Telephone Encounter (Signed)
Ok I sent the eliquis to [facility] as well  I already answered the question of why no further med came from [place]

## 2019-10-03 ENCOUNTER — Other Ambulatory Visit: Payer: Self-pay

## 2019-10-05 ENCOUNTER — Other Ambulatory Visit: Payer: Self-pay

## 2019-10-08 ENCOUNTER — Other Ambulatory Visit: Payer: Self-pay | Admitting: Internal Medicine

## 2019-10-08 NOTE — Telephone Encounter (Signed)
Please refill as per office routine med refill policy (all routine meds refilled for 3 mo or monthly per pt preference up to one year from last visit, then month to month grace period for 3 mo, then further med refills will have to be denied)  

## 2019-10-10 DIAGNOSIS — J449 Chronic obstructive pulmonary disease, unspecified: Secondary | ICD-10-CM | POA: Diagnosis not present

## 2019-10-12 ENCOUNTER — Other Ambulatory Visit: Payer: Self-pay | Admitting: Surgery

## 2019-10-12 ENCOUNTER — Telehealth: Payer: Self-pay | Admitting: *Deleted

## 2019-10-12 DIAGNOSIS — K439 Ventral hernia without obstruction or gangrene: Secondary | ICD-10-CM | POA: Diagnosis not present

## 2019-10-12 NOTE — Telephone Encounter (Signed)
   [facility] Pre-operative Risk Assessment    Request for surgical clearance:  1. What type of surgery is being performed? Epigastric hernia repair   2. When is this surgery scheduled? TBD   3. What type of clearance is required (medical clearance vs. Pharmacy clearance to hold med vs. Both)? both  4. Are there any medications that need to be held prior to surgery and how long? Eliquis    5. Practice name and name of physician performing surgery? [facility] Dr. Ninfa Linden   6. What is your office phone number [phone]    7.   What is your office fax number            [phone] attn: Malachi Bonds CMA  8.   Anesthesia type (None, local, MAC,  general) ? general   [name] [name] A Tanna Loeffler 10/12/2019, 4:05 PM  _________________________________________________________________

## 2019-10-15 NOTE — Telephone Encounter (Signed)
I called pt to update her status in regards to clearing for surgery. No answer. I left VM to call back to preop clinic.

## 2019-10-15 NOTE — Telephone Encounter (Signed)
   Primary Cardiologist: Kirk Ruths, MD  Chart reviewed as part of pre-operative protocol coverage. Patient was contacted 10/15/2019 in reference to pre-operative risk assessment for pending surgery as outlined below.  [name] [name] was last seen on 05/25/2019 by Almyra Deforest, PA.  Since that day, [name] has done well. She is treated for PAF and diastolic heart failure. She also has a pacemaker. She reports that she has been very stable with no chest pain or shortness of breath. She is compensated on daily lasix. She also denies any palpitations or perceived afib episodes. She says that her heart rates have been mostly in the 80's at home. She uses around the clock oxygen for COPD and OHS and this likely increases her surgical risk slightly.   Therefore, based on ACC/AHA guidelines, the patient would be at acceptable risk for the planned procedure without further cardiovascular testing.   Per office protocol, patient can hold Eliquis for 3 days prior to procedure.    Patient will not need bridging with Lovenox (enoxaparin) around procedure.  I will route this recommendation to the requesting party via Epic fax function and remove from pre-op pool.  Please call with questions.  Daune Perch, NP 10/15/2019, 4:02 PM

## 2019-10-15 NOTE — Telephone Encounter (Signed)
Patient with diagnosis of atrial fibrillation on Eliquis for anticoagulation.    Procedure: epigastric hernia repair Date of procedure: TBD  CHADS2-VASc score of  5 (CHF, HTN, AGE, CAD, female)  CrCl 107.4 Platelet count 125  Per office protocol, patient can hold Eliquis for 3 days prior to procedure.    Patient will not need bridging with Lovenox (enoxaparin) around procedure.

## 2019-10-17 ENCOUNTER — Encounter: Payer: Self-pay | Admitting: Cardiology

## 2019-10-17 NOTE — Telephone Encounter (Signed)
Patient is returning phone call.  °

## 2019-10-17 NOTE — Telephone Encounter (Signed)
Does not need stress test prior to surgery. Kirk Ruths

## 2019-10-17 NOTE — Telephone Encounter (Signed)
Called patient, and advised of message from MD.  Patient verbalized understanding.

## 2019-10-17 NOTE — Telephone Encounter (Signed)
Error

## 2019-10-17 NOTE — Telephone Encounter (Signed)
Patient is calling to follow up in regards to whether she needs to have a stress test prior to Hernia Repair or not. Please call.

## 2019-10-17 NOTE — Telephone Encounter (Signed)
Left message for patient with Dr Youlanda Roys recommendations.

## 2019-10-17 NOTE — Telephone Encounter (Signed)
Back in December a patient message was sent to tell her to have a Stress test completed before clearance- she is calling because she just seen this message and wants to know if she still needs it completed? I believe they gave her clearance.

## 2019-10-22 ENCOUNTER — Other Ambulatory Visit: Payer: Self-pay | Admitting: Internal Medicine

## 2019-10-22 MED ORDER — BUDESONIDE-FORMOTEROL FUMARATE 160-4.5 MCG/ACT IN AERO: 2.0000 | INHALATION_SPRAY | Freq: Two times a day (BID) | RESPIRATORY_TRACT | 2 refills | Status: AC

## 2019-10-22 NOTE — Telephone Encounter (Signed)
Reviewed chart pt is up-to-date sent refills to [facility].Marland KitchenJohny [name]

## 2019-10-22 NOTE — Telephone Encounter (Signed)
        1. Which medications need to be refilled? (please list name of each medication and dose if known) budesonide-formoterol (SYMBICORT) 160-4.5 MCG/ACT inhaler   2. Which pharmacy/location (including street and city if local pharmacy) is medication to be sent to? [facility] mail order  3. Do they need a 30 day or 90 day supply? [place]

## 2019-10-24 NOTE — Progress Notes (Addendum)
[facility] [address] [place], [place] [address] Phone: [phone] Fax: [phone]  [facility] [phone] - [place], [place] - [phone] AT [place] [address] Phone: [phone] Fax: [phone]  [facility] - [place], [place] [address] Phone: [phone] Fax: [phone]      Your procedure is scheduled on Monday, October 29, 2019.  Report to [facility] Main Entrance "A" at 10:30 A.M., and check in at the Admitting office.  Call this number if you have problems the morning of surgery:  [phone]  Call [phone] if you have any questions prior to your surgery date Monday-Friday 8am-4pm    Remember:  Do not eat after midnight the night before your surgery  You may drink clear liquids until 9:30 AM the morning of your surgery.   Clear liquids allowed are: Water, Non-Citrus Juices (without pulp), Carbonated Beverages, Clear Tea, Black Coffee Only, and Gatorade    Take these medicines the morning of surgery with A SIP OF WATER: allopurinol (ZYLOPRIM) atorvastatin (LIPITOR) budesonide-formoterol inhaler (SYMBICORT) clonazePAM (KLONOPIN) - if needed Guaifenesin (MUCINEX MAXIMUM STRENGTH)  levothyroxine (SYNTHROID)  metoprolol tartrate (LOPRESSOR) omeprazole (PRILOSEC)  - if needed traMADol (ULTRAM) - if needed  7 days prior to surgery STOP taking any Aspirin (unless otherwise instructed by your surgeon), Aleve, Naproxen, Ibuprofen, Motrin, Advil, Goody's, BC's, all herbal medications, fish oil, and all vitamins.    The Morning of Surgery  Do not wear jewelry, make-up or nail polish.  Do not wear lotions, powders, or perfumes, or deodorant  Do not shave 48 hours prior to surgery.  Do not bring valuables to the hospital.  [facility] is not responsible for  any belongings or valuables.  If you are a smoker, DO NOT Smoke 24 hours prior to surgery  If you wear a CPAP at night please bring your mask the morning of surgery   Remember that you must have someone to transport you home after your surgery, and remain with you for 24 hours if you are discharged the same day.   Please bring cases for contacts, glasses, hearing aids, dentures or bridgework because it cannot be worn into surgery.    Leave your suitcase in the car.  After surgery it may be brought to your room.  For patients admitted to the hospital, discharge time will be determined by your treatment team.  Patients discharged the day of surgery will not be allowed to drive home.    Special instructions:   [place]- Preparing For Surgery  Before surgery, you can play an important role. Because skin is not sterile, your skin needs to be as free of germs as possible. You can reduce the number of germs on your skin by washing with CHG (chlorahexidine gluconate) Soap before surgery.  CHG is an antiseptic cleaner which kills germs and bonds with the skin to continue killing germs even after washing.    Oral Hygiene is also important to reduce your risk of infection.  Remember - BRUSH YOUR TEETH THE MORNING OF SURGERY WITH YOUR REGULAR TOOTHPASTE  Please do not use if you have an allergy to CHG or antibacterial soaps. If your skin becomes reddened/irritated stop using the CHG.  Do not shave (including legs and underarms) for at least 48 hours prior to first CHG shower. It is OK to shave your  face.  Please follow these instructions carefully.   1. Shower the NIGHT BEFORE SURGERY and the MORNING OF SURGERY with CHG Soap.   2. If you chose to wash your hair, wash your hair first as usual with your normal shampoo.  3. After you shampoo, rinse your hair and body thoroughly to remove the shampoo.  4. Use CHG as you would any other liquid soap. You can apply CHG directly to the skin and  wash gently with a scrungie or a clean washcloth.   5. Apply the CHG Soap to your body ONLY FROM THE NECK DOWN.  Do not use on open wounds or open sores. Avoid contact with your eyes, ears, mouth and genitals (private parts). Wash Face and genitals (private parts)  with your normal soap.   6. Wash thoroughly, paying special attention to the area where your surgery will be performed.  7. Thoroughly rinse your body with warm water from the neck down.  8. DO NOT shower/wash with your normal soap after using and rinsing off the CHG Soap.  9. Pat yourself dry with a CLEAN TOWEL.  10. Wear CLEAN PAJAMAS to bed the night before surgery, wear comfortable clothes the morning of surgery  11. Place CLEAN SHEETS on your bed the night of your first shower and DO NOT SLEEP WITH PETS.    Day of Surgery:  Please shower the morning of surgery with the CHG soap Do not apply any deodorants/lotions. Please wear clean clothes to the hospital/surgery center.   Remember to brush your teeth WITH YOUR REGULAR TOOTHPASTE.   Please read over the following fact sheets that you were given.

## 2019-10-25 ENCOUNTER — Other Ambulatory Visit (HOSPITAL_COMMUNITY)
Admission: RE | Admit: 2019-10-25 | Discharge: 2019-10-25 | Disposition: A | Discharge status: Home / Self Care | Payer: Medicare HMO | Source: Ambulatory Visit | Attending: Surgery | Admitting: Surgery

## 2019-10-25 ENCOUNTER — Other Ambulatory Visit: Payer: Self-pay

## 2019-10-25 ENCOUNTER — Encounter (HOSPITAL_COMMUNITY): Payer: Self-pay

## 2019-10-25 ENCOUNTER — Encounter (HOSPITAL_COMMUNITY)
Admission: RE | Admit: 2019-10-25 | Discharge: 2019-10-25 | Disposition: A | Discharge status: Home / Self Care | Payer: Medicare HMO | Source: Ambulatory Visit | Attending: Surgery | Admitting: Surgery

## 2019-10-25 DIAGNOSIS — Z20822 Contact with and (suspected) exposure to covid-19: Secondary | ICD-10-CM | POA: Insufficient documentation

## 2019-10-25 DIAGNOSIS — Z01812 Encounter for preprocedural laboratory examination: Secondary | ICD-10-CM | POA: Diagnosis not present

## 2019-10-25 HISTORY — DX: Presence of cardiac pacemaker: Z95.0

## 2019-10-25 HISTORY — DX: Heart failure, unspecified: I50.9

## 2019-10-25 HISTORY — DX: Fatty (change of) liver, not elsewhere classified: K76.0

## 2019-10-25 HISTORY — DX: Acute myocardial infarction, unspecified: I21.9

## 2019-10-25 HISTORY — DX: Other complications of anesthesia, initial encounter: T88.59XA

## 2019-10-25 LAB — CBC
HCT: 36.5 % (ref 36.0–46.0)
Hemoglobin: 10.7 g/dL — ABNORMAL LOW (ref 12.0–15.0)
MCH: 29 pg (ref 26.0–34.0)
MCHC: 29.3 g/dL — ABNORMAL LOW (ref 30.0–36.0)
MCV: 98.9 fL (ref 80.0–100.0)
Platelets: 149 10*3/uL — ABNORMAL LOW (ref 150–400)
RBC: 3.69 MIL/uL — ABNORMAL LOW (ref 3.87–5.11)
RDW: 16.8 % — ABNORMAL HIGH (ref 11.5–15.5)
WBC: 5.1 10*3/uL (ref 4.0–10.5)
nRBC: 0 % (ref 0.0–0.2)

## 2019-10-25 LAB — COMPREHENSIVE METABOLIC PANEL
ALT: 27 U/L (ref 0–44)
AST: 38 U/L (ref 15–41)
Albumin: 3.4 g/dL — ABNORMAL LOW (ref 3.5–5.0)
Alkaline Phosphatase: 158 U/L — ABNORMAL HIGH (ref 38–126)
Anion gap: 16 — ABNORMAL HIGH (ref 5–15)
BUN: 23 mg/dL (ref 8–23)
CO2: 25 mmol/L (ref 22–32)
Calcium: 9.2 mg/dL (ref 8.9–10.3)
Chloride: 103 mmol/L (ref 98–111)
Creatinine, Ser: 1.33 mg/dL — ABNORMAL HIGH (ref 0.44–1.00)
GFR calc Af Amer: 46 mL/min — ABNORMAL LOW (ref >60)
GFR calc non Af Amer: 40 mL/min — ABNORMAL LOW (ref >60)
Glucose, Bld: 77 mg/dL (ref 70–99)
Potassium: 5.3 mmol/L — ABNORMAL HIGH (ref 3.5–5.1)
Sodium: 144 mmol/L (ref 135–145)
Total Bilirubin: 1.6 mg/dL — ABNORMAL HIGH (ref 0.3–1.2)
Total Protein: 7.3 g/dL (ref 6.5–8.1)

## 2019-10-25 LAB — SARS CORONAVIRUS 2 (TAT 6-24 HRS): SARS Coronavirus 2: NEGATIVE

## 2019-10-25 NOTE — Progress Notes (Signed)
PCP - Dr. Biagio Borg Cardiologist - Dr. Hal Morales  PPM/ICD - Pacemaker  Device Orders - faxed to [facility] on 10/25/2019 Rep Notified - awaiting peri-op device orders  Chest x-ray - N/A EKG - 9 Stress Test - 2007 (C.E.) ECHO - 05/09/18 Cardiac Cath - 2007  Sleep Study - YES CPAP - Yes, per patient wears it nightly.  O2 - continuous @ 2L  Blood Thinner Instructions: Eliquis - per patient last dose will be 10/25/2019 Aspirin Instructions: N/A  ERAS Protcol - No  COVID TEST- 10/25/2019 Test pending. Patient verbalized understanding of self quarantine instructions.  Anesthesia review: YES, cardiac hx, cardiac clearance, continuous O2 @ 2L  Patient denies shortness of breath, fever, cough and chest pain at PAT appointment  All instructions explained to the patient, with a verbal understanding of the material. Patient agrees to go over the instructions while at home for a better understanding. Patient also instructed to self quarantine after being tested for COVID-19. The opportunity to ask questions was provided.

## 2019-10-25 NOTE — Progress Notes (Signed)
[id number], [place] [address] Phone: [phone] Fax: [phone]  [facility] [phone] - [place], [place] - [phone] AT [place] [address] Phone: [phone] Fax: [phone]  [facility] - [place], [place] [address] Phone: [phone] Fax: [phone]    Your procedure is scheduled on Monday, February 8th.  Report to [facility] Main Entrance "A" at 10:30 A.M., and check in at the Admitting office.  Call this number if you have problems the morning of surgery:  [phone]  Call [phone] if you have any questions prior to your surgery date Monday-Friday 8am-4pm   Remember:  Do not eat after midnight the night before your surgery (Sunday, 10/28/2019)  You may drink clear liquids until 9:30 AM the morning of your surgery.   Clear liquids allowed are: Water, Non-Citrus Juices (without pulp), Carbonated Beverages, Clear Tea, Black Coffee Only, and Gatorade    Take these medicines the morning of surgery with A SIP OF WATER: allopurinol (ZYLOPRIM) atorvastatin (LIPITOR) budesonide-formoterol inhaler (SYMBICORT)  Guaifenesin (MUCINEX MAXIMUM STRENGTH)  levothyroxine (SYNTHROID)  metoprolol tartrate (LOPRESSOR)  If needed - clonazePAM (KLONOPIN), omeprazole (PRILOSEC), traMADol (ULTRAM) - if needed  Follow your surgeon's instructions on when to stop apixaban (ELIQUIS).  If no instructions were given by your surgeon then you will need to call the office to get those instructions.    As of today, STOP taking any Aspirin (unless otherwise instructed by your surgeon), Aleve, Naproxen, Ibuprofen, Motrin, Advil, Goody's, BC's, all herbal medications, fish oil, and all vitamins.   The Morning of Surgery  Do not wear jewelry, make-up or nail polish.  Do  not wear lotions, powders, or perfumes, or deodorant  Do not shave 48 hours prior to surgery.  Do not bring valuables to the hospital.  [facility] is not responsible for any belongings or valuables.  If you are a smoker, DO NOT Smoke 24 hours prior to surgery  If you wear a CPAP at night please bring your mask the morning of surgery   Remember that you must have someone to transport you home after your surgery, and remain with you for 24 hours if you are discharged the same day.  Please bring cases for contacts, glasses, hearing aids, dentures or bridgework because it cannot be worn into surgery.   Leave your suitcase in the car.  After surgery it may be brought to your room.  For patients admitted to the hospital, discharge time will be determined by your treatment team.  Patients discharged the day of surgery will not be allowed to drive home.   Special instructions:   [place]- Preparing For Surgery  Before surgery, you can play an important role. Because skin is not sterile, your skin needs to be as free of germs as possible. You can reduce the number of germs on your skin by washing with CHG (chlorahexidine gluconate) Soap before surgery.  CHG is an antiseptic cleaner which kills germs and bonds with the skin to continue killing germs even after washing.    Oral Hygiene is also important to reduce your risk of infection.  Remember - BRUSH YOUR TEETH THE MORNING OF SURGERY WITH YOUR REGULAR TOOTHPASTE  Please do not use if you have an allergy to CHG or antibacterial soaps. If your skin becomes reddened/irritated stop using the CHG.  Do not shave (including legs and underarms) for at least 48 hours prior to first CHG shower. It is OK to shave your face.  Please follow these instructions carefully.   1. Shower the NIGHT BEFORE SURGERY and the MORNING OF SURGERY with CHG Soap.   2. If you chose to wash your hair, wash your hair first as usual with your normal  shampoo.  3. After you shampoo, rinse your hair and body thoroughly to remove the shampoo.  4. Use CHG as you would any other liquid soap. You can apply CHG directly to the skin and wash gently with a scrungie or a clean washcloth.   5. Apply the CHG Soap to your body ONLY FROM THE NECK DOWN.  Do not use on open wounds or open sores. Avoid contact with your eyes, ears, mouth and genitals (private parts). Wash Face and genitals (private parts)  with your normal soap.   6. Wash thoroughly, paying special attention to the area where your surgery will be performed.  7. Thoroughly rinse your body with warm water from the neck down.  8. DO NOT shower/wash with your normal soap after using and rinsing off the CHG Soap.  9. Pat yourself dry with a CLEAN TOWEL.  10. Wear CLEAN PAJAMAS to bed the night before surgery, wear comfortable clothes the morning of surgery  11. Place CLEAN SHEETS on your bed the night of your first shower and DO NOT SLEEP WITH PETS.  Day of Surgery: Please shower the morning of surgery with the CHG soap Do not apply any deodorants/lotions. Please wear clean clothes to the hospital/surgery center.   Remember to brush your teeth WITH YOUR REGULAR TOOTHPASTE.  Please read over the following fact sheets that you were given.

## 2019-10-26 MED ORDER — VANCOMYCIN HCL 1500 MG/300ML IV SOLN
1500.0000 mg | INTRAVENOUS | Status: AC
  Administered 2019-10-29: 1500 mg via INTRAVENOUS
  Filled 2019-10-26: qty 300

## 2019-10-26 NOTE — Progress Notes (Signed)
Anesthesia Chart Review:  Follows with cardiology for hx of nonobstructive CAD by cath in 2007, PAF, sick sinus syndrome s/p Biotronik pacemaker implanted 08/2013. She had a NSTEMI in May 2011 in the setting of SIRS from leg cellulitis.  This was felt to be demand ischemia.  She also had atrial flutter with RVR in June 2014 however felt to be a poor candidate for atrial flutter ablation.  TEE DCCV was performed.  Echo 03/15/2013 showed EF 55 to 60%, no LA clot, positive PFO, mild to moderate TR.  She had a syncope episode in December 2014.  Implantable loop recorder demonstrated symptomatic bradycardia with pauses and she ultimately had a pacemaker placed.  Echocardiogram in August 2019 showed EF of 55 to 60% with mild to moderate AI.    Cardiac clearance per telephone encounter 10/15/19, "[name] [name] was last seen on 05/25/2019 by [name] Deforest, PA.  Since that day, [name] [name] has done well. She is treated for PAF and diastolic heart failure. She also has a pacemaker. She reports that she has been very stable with no chest pain or shortness of breath. She is compensated on daily lasix. She also denies any palpitations or perceived afib episodes. She says that her heart rates have been mostly in the 80's at home. She uses around the clock oxygen for COPD and OHS and this likely increases her surgical risk slightly. Therefore, based on ACC/AHA guidelines, the patient would be at acceptable risk for the planned procedure without further cardiovascular testing. Per office protocol, patient can holdEliquisfor 3days prior to procedure. Patient will not need bridging with Lovenox (enoxaparin) around procedure."  COPD and OHS on chronic O2, 2L via [name].  Periop device orders have been faxed to EP clinic.   Preop labs reviewed, potassium mildly elevated 5.3, creatinine mildly elevated 1.33, chronic anemia with Hgb 10.7 (appears to be baseline for her), alk phos mildly elevated 158 but stable compared to labs  07/09/19, t bili mildly elevated 1.6.  EKG 05/25/19: Sinus rhythm with frequent PVCs. Rate 98. Possible LAE  TTE 05/09/18: - Left ventricle: The cavity size was normal. Wall thickness was  normal. Systolic function was normal. The estimated ejection  fraction was in the range of 55% to 60%. Wall motion was normal;  there were no regional wall motion abnormalities. Doppler  parameters are consistent with abnormal left ventricular  relaxation (grade 1 diastolic dysfunction). Doppler parameters  are consistent with elevated mean left atrial filling pressure.  - Aortic valve: There was mild to moderate regurgitation directed  centrally in the LVOT.  - Mitral valve: Moderately calcified annulus. Moderately thickened  leaflets . There was mild to moderate regurgitation directed  centrally.  - Left atrium: The atrium was mildly dilated.  - Tricuspid valve: There was mild-moderate regurgitation directed  centrally.  - Pulmonary arteries: Systolic pressure was mildly increased. PA  peak pressure: 44 mm Hg (S).    [name] Phone [phone] 10/26/2019 9:45 AM

## 2019-10-26 NOTE — Anesthesia Preprocedure Evaluation (Addendum)
Anesthesia Evaluation  Patient identified by MRN, date of birth, ID band Patient awake    Reviewed: Allergy & Precautions, Patient's Chart, lab work & pertinent test results  Airway Mallampati: III       Dental  (+) Teeth Intact   Pulmonary asthma , sleep apnea and Continuous Positive Airway Pressure Ventilation , COPD,  COPD inhaler and oxygen dependent, former smoker,     + decreased breath sounds      Cardiovascular hypertension, Pt. on home beta blockers + CAD and +CHF  + dysrhythmias Atrial Fibrillation + pacemaker  Rhythm:Regular Rate:Normal     Neuro/Psych PSYCHIATRIC DISORDERS Anxiety Depression  Neuromuscular disease    GI/Hepatic negative GI ROS, Neg liver ROS,   Endo/Other  Hypothyroidism   Renal/GU Renal InsufficiencyRenal disease     Musculoskeletal  (+) Arthritis ,   Abdominal (+) + obese,   Peds  Hematology   Anesthesia Other Findings   Reproductive/Obstetrics                            Anesthesia Physical Anesthesia Plan  ASA: III  Anesthesia Plan: General   Post-op Pain Management:    Induction: Intravenous  PONV Risk Score and Plan: 4 or greater and Ondansetron and Treatment may vary due to age or medical condition  Airway Management Planned: Oral ETT and Video Laryngoscope Planned  Additional Equipment: None  Intra-op Plan:   Post-operative Plan: Possible Post-op intubation/ventilation  Informed Consent: I have reviewed the patients History and Physical, chart, labs and discussed the procedure including the risks, benefits and alternatives for the proposed anesthesia with the patient or authorized representative who has indicated his/her understanding and acceptance.     Dental advisory given  Plan Discussed with: CRNA  Anesthesia Plan Comments: (Follows with cardiology for hx of nonobstructive CAD by cath in 2007, PAF, sick sinus syndrome s/p Biotronik  pacemaker implanted 08/2013. She had a NSTEMI in May 2011 in the setting of SIRS from leg cellulitis.  This was felt to be demand ischemia.  She also had atrial flutter with RVR in June 2014 however felt to be a poor candidate for atrial flutter ablation.  TEE DCCV was performed.  Echo 03/15/2013 showed EF 55 to 60%, no LA clot, positive PFO, mild to moderate TR.  She had a syncope episode in December 2014.  Implantable loop recorder demonstrated symptomatic bradycardia with pauses and she ultimately had a pacemaker placed.  Echocardiogram in August 2019 showed EF of 55 to 60% with mild to moderate AI.    Cardiac clearance per telephone encounter 10/15/19, "Peyten SHELENE KRAGE was last seen on 05/25/2019 by Almyra Deforest, PA.  Since that day, Avanti P Fehrenbach has done well. She is treated for PAF and diastolic heart failure. She also has a pacemaker. She reports that she has been very stable with no chest pain or shortness of breath. She is compensated on daily lasix. She also denies any palpitations or perceived afib episodes. She says that her heart rates have been mostly in the 80's at home. She uses around the clock oxygen for COPD and OHS and this likely increases her surgical risk slightly. Therefore, based on ACC/AHA guidelines, the patient would be at acceptable risk for the planned procedure without further cardiovascular testing. Per office protocol, patient can holdEliquisfor 3days prior to procedure. Patient will not need bridging with Lovenox (enoxaparin) around procedure."  COPD and OHS on chronic O2, 2L via [place].  Periop device orders have been faxed to EP clinic.   Preop labs reviewed, potassium mildly elevated 5.3, creatinine mildly elevated 1.33, chronic anemia with Hgb 10.7 (appears to be baseline for her), alk phos mildly elevated 158 but stable compared to labs 07/09/19, t bili mildly elevated 1.6.  EKG 05/25/19: Sinus rhythm with frequent PVCs. Rate 98. Possible LAE  TTE 05/09/18: - Left  ventricle: The cavity size was normal. Wall thickness was  normal. Systolic function was normal. The estimated ejection  fraction was in the range of 55% to 60%. Wall motion was normal;  there were no regional wall motion abnormalities. Doppler  parameters are consistent with abnormal left ventricular  relaxation (grade 1 diastolic dysfunction). Doppler parameters  are consistent with elevated mean left atrial filling pressure.  - Aortic valve: There was mild to moderate regurgitation directed  centrally in the LVOT.  - Mitral valve: Moderately calcified annulus. Moderately thickened  leaflets . There was mild to moderate regurgitation directed  centrally.  - Left atrium: The atrium was mildly dilated.  - Tricuspid valve: There was mild-moderate regurgitation directed  centrally.  - Pulmonary arteries: Systolic pressure was mildly increased. PA  peak pressure: 44 mm Hg (S). )       Anesthesia Quick Evaluation

## 2019-10-28 NOTE — H&P (Signed)
[name] [name] Documented: 10/12/2019 11:18 AM Location: [facility] Patient #: [id number] DOB: 08-16-1946 Divorced / Language: English / Race: Black or African American Female   History of Present Illness (Brendaly Townsel A. Ninfa Linden MD; 10/12/2019 11:33 AM) The patient is a 74 year old female who presents with an umbilical hernia. This is a patient with multiple comorbidities including morbid obesity, COPD with home oxygen, on anticoagulation with a pacemaker insertion who presents with a increasingly symptomatic epigastric hernia. She reports she's had a hernia for sometime and is now getting larger and causing pain. She reports only difficult to reduce and she will have nausea. She has had no emesis. The pain is described as sharp and moderate in intensity   Past Surgical History Andreas Blower, CMA; 10/12/2019 11:19 AM) Appendectomy  Breast Biopsy  Left. Breast Mass; Local Excision  Left.  Diagnostic Studies History Andreas Blower, CMA; 10/12/2019 11:19 AM) Colonoscopy  5-10 years ago Mammogram  1-3 years ago Pap Smear  1-5 years ago  Allergies (Armen Ferguson, CMA; 10/12/2019 11:22 AM) Ciprofloxacin HCl *FLUOROQUINOLONES*  Cephalexin *CEPHALOSPORINS*  CODEINE  Dilaudid *ANALGESICS - OPIOID*  Lisinopril *ANTIHYPERTENSIVES*  Cleocin *ANTI-INFECTIVE AGENTS - MISC.*  Doxycycline Hyclate *TETRACYCLINES*  Penicillins   Medication History (Armen Ferguson, CMA; 10/12/2019 11:25 AM) clonazePAM (0.5MG Tablet, Oral) Active. Amitriptyline HCl (100MG Tablet, Oral) Active. traMADol HCl (50MG Tablet, Oral) Active. Eliquis (5MG Tablet, Oral) Active. Atorvastatin Calcium (40MG Tablet, Oral) Active. Allopurinol (300MG Tablet, Oral) Active. Vitamin D (1000UNIT Tablet, Oral) Active. Symbicort (160-4.5MCG/ACT Aerosol, Inhalation) Active. DULoxetine HCl (30MG Capsule DR Part, Oral) Active. Pepcid Complete (10-800-165MG Tablet Chewable, Oral) Active. Mucinex (600MG  Tablet ER 12HR, Oral) Active. Levothyroxine Sodium (200MCG Tablet, Oral) Active. Metoprolol Tartrate (25MG Tablet, Oral) Active. Omeprazole (40MG Capsule DR, Oral) Active. Potassium Chloride Crys ER (20MEQ Tablet ER, Oral) Active. Vitamin E (400UNIT Tablet, Oral) Active. Medications Reconciled  Social History Andreas Blower, CMA; 10/12/2019 11:19 AM) Alcohol use  Occasional alcohol use. Caffeine use  Coffee. No drug use  Tobacco use  Former smoker.  Family History Andreas Blower, [place]; 10/12/2019 11:19 AM) Breast Cancer  Sister. Cervical Cancer  Mother. Heart Disease  Father. Hypertension  Brother, Father, Sister. Respiratory Condition  Brother.  Pregnancy / Birth History Andreas Blower, [place]; 10/12/2019 11:19 AM) Age at menarche  82 years. Gravida  2 Maternal age  34-25 Para  2  Other Problems Andreas Blower, [place]; 10/12/2019 11:19 AM) Anxiety Disorder  Arthritis  Atrial Fibrillation  Back Pain  Breast Cancer  Chronic Obstructive Lung Disease  Home Oxygen Use  Inguinal Hernia  Sleep Apnea  Thyroid Disease  Ventral Hernia Repair     Review of Systems (Armen Ferguson CMA; 10/12/2019 11:19 AM) General Not Present- Appetite Loss, Chills, Fatigue, Fever, Night Sweats, Weight Gain and Weight Loss. Skin Not Present- Change in Wart/Mole, Dryness, Hives, Jaundice, New Lesions, Non-Healing Wounds, Rash and Ulcer. HEENT Not Present- Earache, Hearing Loss, Hoarseness, Nose Bleed, Oral Ulcers, Ringing in the Ears, Seasonal Allergies, Sinus Pain, Sore Throat, Visual Disturbances, Wears glasses/contact lenses and Yellow Eyes. Respiratory Present- Chronic Cough. Not Present- Bloody sputum, Difficulty Breathing, Snoring and Wheezing. Cardiovascular Present- Shortness of Breath and Swelling of Extremities. Not Present- Chest Pain, Difficulty Breathing Lying Down, Leg Cramps, Palpitations and Rapid Heart Rate. Gastrointestinal Present- Chronic diarrhea and  Indigestion. Not Present- Abdominal Pain, Bloating, Bloody Stool, Change in Bowel Habits, Constipation, Difficulty Swallowing, Excessive gas, Gets full quickly at meals, Hemorrhoids, Nausea, Rectal Pain and Vomiting. Musculoskeletal Present- Back Pain, Joint Pain, Joint  Stiffness and Muscle Weakness. Not Present- Muscle Pain and Swelling of Extremities. Neurological Present- Decreased Memory, Numbness and Tingling. Not Present- Fainting, Headaches, Seizures, Tremor, Trouble walking and Weakness. Psychiatric Present- Anxiety. Not Present- Bipolar, Change in Sleep Pattern, Depression, Fearful and Frequent crying. Endocrine Not Present- Cold Intolerance, Excessive Hunger, Hair Changes, Heat Intolerance, Hot flashes and New Diabetes. Hematology Present- Blood Thinners. Not Present- Easy Bruising, Excessive bleeding, Gland problems, HIV and Persistent Infections.  Vitals (Armen Ferguson CMA; 10/12/2019 11:20 AM) 10/12/2019 11:19 AM Weight: 290 lb Height: 63in Body Surface Area: 2.26 m Body Mass Index: 51.37 kg/m  Temp.: 64F  Pulse: 105 (Regular)  P.OX: 85% (Room air) BP: 142/86 (Sitting, Left Arm, Standard)       Physical Exam (Kerria Sapien A. Ninfa Linden MD; 10/12/2019 11:34 AM) The physical exam findings are as follows: Note:On exam, she is morbidly obese. There is a tender, but reducible moderate sized epigastric hernia. She is walking with a walker and on oxygen    Assessment & Plan (Gertie Broerman A. Ninfa Linden MD; 10/12/2019 11:35 AM) EPIGASTRIC HERNIA (K43.9) Impression: I diagnoses the patient and her daughter. Because she is becoming increasingly symptomatic and is on anticoagulation, I believe repair of the hernia is necessary to prevent strangulate the intestines. I believe she would do poorly if the hernia had to be repaired emergently giving her need for chronic anticoagulation and a pacemaker in place. I discussed this with her and her daughter. I discussed the surgical procedure in  detail including the risks of bleeding, infection, recurrence, use of mesh, cardiopulmonary issues. We will get clearance from her cardiologist preoperatively. Surgery will be scheduled as soon as possible  This patient encounter took 30 minutes today to perform the following: take history, perform exam, review outside records, interpret imaging, counsel the patient on their diagnosis and document encounter, findings & plan in the EHR

## 2019-10-29 ENCOUNTER — Observation Stay (HOSPITAL_COMMUNITY)
Admission: RE | Admit: 2019-10-29 | Discharge: 2019-10-30 | Disposition: A | Discharge status: Home / Self Care | Payer: Medicare HMO | Source: Home / Self Care | Attending: Surgery | Admitting: Surgery

## 2019-10-29 ENCOUNTER — Encounter (HOSPITAL_COMMUNITY): Payer: Self-pay | Admitting: Surgery

## 2019-10-29 ENCOUNTER — Ambulatory Visit (HOSPITAL_COMMUNITY): Payer: Medicare HMO | Admitting: Anesthesiology

## 2019-10-29 ENCOUNTER — Encounter (HOSPITAL_COMMUNITY)
Admission: RE | Disposition: A | Discharge status: Home / Self Care | Payer: Self-pay | Source: Home / Self Care | Attending: Surgery

## 2019-10-29 ENCOUNTER — Other Ambulatory Visit: Payer: Self-pay

## 2019-10-29 DIAGNOSIS — I4891 Unspecified atrial fibrillation: Secondary | ICD-10-CM | POA: Insufficient documentation

## 2019-10-29 DIAGNOSIS — I130 Hypertensive heart and chronic kidney disease with heart failure and stage 1 through stage 4 chronic kidney disease, or unspecified chronic kidney disease: Secondary | ICD-10-CM | POA: Diagnosis not present

## 2019-10-29 DIAGNOSIS — F329 Major depressive disorder, single episode, unspecified: Secondary | ICD-10-CM | POA: Insufficient documentation

## 2019-10-29 DIAGNOSIS — I509 Heart failure, unspecified: Secondary | ICD-10-CM | POA: Insufficient documentation

## 2019-10-29 DIAGNOSIS — Z20822 Contact with and (suspected) exposure to covid-19: Secondary | ICD-10-CM | POA: Diagnosis not present

## 2019-10-29 DIAGNOSIS — Z9981 Dependence on supplemental oxygen: Secondary | ICD-10-CM | POA: Insufficient documentation

## 2019-10-29 DIAGNOSIS — I110 Hypertensive heart disease with heart failure: Secondary | ICD-10-CM | POA: Insufficient documentation

## 2019-10-29 DIAGNOSIS — I5033 Acute on chronic diastolic (congestive) heart failure: Secondary | ICD-10-CM | POA: Diagnosis not present

## 2019-10-29 DIAGNOSIS — Z853 Personal history of malignant neoplasm of breast: Secondary | ICD-10-CM | POA: Insufficient documentation

## 2019-10-29 DIAGNOSIS — Z87891 Personal history of nicotine dependence: Secondary | ICD-10-CM | POA: Insufficient documentation

## 2019-10-29 DIAGNOSIS — I2510 Atherosclerotic heart disease of native coronary artery without angina pectoris: Secondary | ICD-10-CM | POA: Insufficient documentation

## 2019-10-29 DIAGNOSIS — J9621 Acute and chronic respiratory failure with hypoxia: Secondary | ICD-10-CM | POA: Diagnosis not present

## 2019-10-29 DIAGNOSIS — J9622 Acute and chronic respiratory failure with hypercapnia: Secondary | ICD-10-CM | POA: Diagnosis not present

## 2019-10-29 DIAGNOSIS — E860 Dehydration: Secondary | ICD-10-CM | POA: Diagnosis not present

## 2019-10-29 DIAGNOSIS — I495 Sick sinus syndrome: Secondary | ICD-10-CM | POA: Diagnosis not present

## 2019-10-29 DIAGNOSIS — G4730 Sleep apnea, unspecified: Secondary | ICD-10-CM | POA: Insufficient documentation

## 2019-10-29 DIAGNOSIS — I959 Hypotension, unspecified: Secondary | ICD-10-CM | POA: Diagnosis not present

## 2019-10-29 DIAGNOSIS — N179 Acute kidney failure, unspecified: Secondary | ICD-10-CM | POA: Diagnosis not present

## 2019-10-29 DIAGNOSIS — K439 Ventral hernia without obstruction or gangrene: Secondary | ICD-10-CM | POA: Insufficient documentation

## 2019-10-29 DIAGNOSIS — Z79890 Hormone replacement therapy (postmenopausal): Secondary | ICD-10-CM | POA: Insufficient documentation

## 2019-10-29 DIAGNOSIS — Z950 Presence of cardiac pacemaker: Secondary | ICD-10-CM | POA: Insufficient documentation

## 2019-10-29 DIAGNOSIS — M109 Gout, unspecified: Secondary | ICD-10-CM | POA: Diagnosis not present

## 2019-10-29 DIAGNOSIS — K567 Ileus, unspecified: Secondary | ICD-10-CM | POA: Diagnosis not present

## 2019-10-29 DIAGNOSIS — Z7901 Long term (current) use of anticoagulants: Secondary | ICD-10-CM | POA: Insufficient documentation

## 2019-10-29 DIAGNOSIS — J449 Chronic obstructive pulmonary disease, unspecified: Secondary | ICD-10-CM | POA: Insufficient documentation

## 2019-10-29 DIAGNOSIS — Z6841 Body Mass Index (BMI) 40.0 and over, adult: Secondary | ICD-10-CM | POA: Insufficient documentation

## 2019-10-29 DIAGNOSIS — Z79899 Other long term (current) drug therapy: Secondary | ICD-10-CM | POA: Insufficient documentation

## 2019-10-29 DIAGNOSIS — E039 Hypothyroidism, unspecified: Secondary | ICD-10-CM | POA: Insufficient documentation

## 2019-10-29 DIAGNOSIS — F419 Anxiety disorder, unspecified: Secondary | ICD-10-CM | POA: Insufficient documentation

## 2019-10-29 DIAGNOSIS — G9341 Metabolic encephalopathy: Secondary | ICD-10-CM | POA: Diagnosis not present

## 2019-10-29 HISTORY — PX: EPIGASTRIC HERNIA REPAIR: SHX404

## 2019-10-29 SURGERY — REPAIR, HERNIA, EPIGASTRIC, ADULT
Anesthesia: General | Site: Abdomen

## 2019-10-29 MED ORDER — PROPOFOL 10 MG/ML IV BOLUS
INTRAVENOUS | Status: DC | PRN
  Administered 2019-10-29: 30 mg via INTRAVENOUS
  Administered 2019-10-29: 130 mg via INTRAVENOUS

## 2019-10-29 MED ORDER — ONDANSETRON HCL 4 MG/2ML IJ SOLN: 4.0000 mg | Freq: Four times a day (QID) | INTRAMUSCULAR | Status: DC | PRN

## 2019-10-29 MED ORDER — METOPROLOL TARTRATE 25 MG PO TABS: 25.0000 mg | ORAL_TABLET | Freq: Two times a day (BID) | ORAL | Status: DC | PRN

## 2019-10-29 MED ORDER — 0.9 % SODIUM CHLORIDE (POUR BTL) OPTIME
TOPICAL | Status: DC | PRN
Start: 2019-10-29 — End: 2019-10-29
  Administered 2019-10-29: 1000 mL

## 2019-10-29 MED ORDER — MORPHINE SULFATE (PF) 2 MG/ML IV SOLN: 2.0000 mg | INTRAVENOUS | Status: DC | PRN

## 2019-10-29 MED ORDER — ALBUTEROL SULFATE HFA 108 (90 BASE) MCG/ACT IN AERS
INHALATION_SPRAY | RESPIRATORY_TRACT | Status: DC | PRN
  Administered 2019-10-29: 6 via RESPIRATORY_TRACT

## 2019-10-29 MED ORDER — POTASSIUM CHLORIDE IN NACL 20-0.9 MEQ/L-% IV SOLN: INTRAVENOUS | Status: DC

## 2019-10-29 MED ORDER — FUROSEMIDE 40 MG PO TABS
60.0000 mg | ORAL_TABLET | Freq: Two times a day (BID) | ORAL | Status: DC
  Administered 2019-10-29 – 2019-10-30 (×2): 60 mg via ORAL
  Filled 2019-10-29 (×2): qty 1

## 2019-10-29 MED ORDER — CHLORHEXIDINE GLUCONATE CLOTH 2 % EX PADS: 6.0000 | MEDICATED_PAD | Freq: Once | CUTANEOUS | Status: DC

## 2019-10-29 MED ORDER — MEPERIDINE HCL 25 MG/ML IJ SOLN: 6.2500 mg | INTRAMUSCULAR | Status: DC | PRN

## 2019-10-29 MED ORDER — NALOXONE HCL 0.4 MG/ML IJ SOLN
INTRAMUSCULAR | Status: DC | PRN
  Administered 2019-10-29 (×2): 40 ug via INTRAVENOUS

## 2019-10-29 MED ORDER — EPHEDRINE 5 MG/ML INJ
INTRAVENOUS | Status: AC
  Filled 2019-10-29: qty 10

## 2019-10-29 MED ORDER — PHENYLEPHRINE 40 MCG/ML (10ML) SYRINGE FOR IV PUSH (FOR BLOOD PRESSURE SUPPORT)
PREFILLED_SYRINGE | INTRAVENOUS | Status: AC
  Filled 2019-10-29: qty 10

## 2019-10-29 MED ORDER — MOMETASONE FURO-FORMOTEROL FUM 200-5 MCG/ACT IN AERO
2.0000 | INHALATION_SPRAY | Freq: Two times a day (BID) | RESPIRATORY_TRACT | Status: DC
  Administered 2019-10-29 – 2019-10-30 (×2): 2 via RESPIRATORY_TRACT
  Filled 2019-10-29: qty 8.8

## 2019-10-29 MED ORDER — PHENYLEPHRINE 40 MCG/ML (10ML) SYRINGE FOR IV PUSH (FOR BLOOD PRESSURE SUPPORT)
PREFILLED_SYRINGE | INTRAVENOUS | Status: DC | PRN
  Administered 2019-10-29: 200 ug via INTRAVENOUS
  Administered 2019-10-29: 80 ug via INTRAVENOUS
  Administered 2019-10-29: 160 ug via INTRAVENOUS
  Administered 2019-10-29: 120 ug via INTRAVENOUS

## 2019-10-29 MED ORDER — NALOXONE HCL 0.4 MG/ML IJ SOLN
INTRAMUSCULAR | Status: AC
  Filled 2019-10-29: qty 1

## 2019-10-29 MED ORDER — PROMETHAZINE HCL 25 MG/ML IJ SOLN: 6.2500 mg | INTRAMUSCULAR | Status: DC | PRN

## 2019-10-29 MED ORDER — ACETAMINOPHEN 325 MG PO TABS: 325.0000 mg | ORAL_TABLET | Freq: Once | ORAL | Status: DC | PRN

## 2019-10-29 MED ORDER — ONDANSETRON HCL 4 MG/2ML IJ SOLN
INTRAMUSCULAR | Status: AC
  Filled 2019-10-29: qty 2

## 2019-10-29 MED ORDER — DULOXETINE HCL 60 MG PO CPEP
60.0000 mg | ORAL_CAPSULE | Freq: Every day | ORAL | Status: DC
  Administered 2019-10-29: 60 mg via ORAL
  Filled 2019-10-29: qty 1
  Filled 2019-10-29: qty 2

## 2019-10-29 MED ORDER — ACETAMINOPHEN 10 MG/ML IV SOLN: 1000.0000 mg | Freq: Once | INTRAVENOUS | Status: DC | PRN

## 2019-10-29 MED ORDER — ALBUTEROL SULFATE HFA 108 (90 BASE) MCG/ACT IN AERS
INHALATION_SPRAY | RESPIRATORY_TRACT | Status: AC
  Filled 2019-10-29: qty 6.7

## 2019-10-29 MED ORDER — GUAIFENESIN ER 600 MG PO TB12
1200.0000 mg | ORAL_TABLET | Freq: Two times a day (BID) | ORAL | Status: DC
  Administered 2019-10-29 – 2019-10-30 (×3): 1200 mg via ORAL
  Filled 2019-10-29 (×3): qty 2

## 2019-10-29 MED ORDER — LEVOTHYROXINE SODIUM 200 MCG PO TABS
200.0000 ug | ORAL_TABLET | Freq: Every day | ORAL | Status: DC
  Administered 2019-10-30: 06:00:00 200 ug via ORAL
  Filled 2019-10-29: qty 1
  Filled 2019-10-29: qty 2

## 2019-10-29 MED ORDER — TRAMADOL HCL 50 MG PO TABS: 50.0000 mg | ORAL_TABLET | Freq: Four times a day (QID) | ORAL | 0 refills | Status: DC | PRN

## 2019-10-29 MED ORDER — DIPHENHYDRAMINE HCL 12.5 MG/5ML PO ELIX
12.5000 mg | ORAL_SOLUTION | Freq: Four times a day (QID) | ORAL | Status: DC | PRN
  Filled 2019-10-29: qty 5

## 2019-10-29 MED ORDER — ONDANSETRON HCL 4 MG/2ML IJ SOLN
INTRAMUSCULAR | Status: DC | PRN
  Administered 2019-10-29: 4 mg via INTRAVENOUS

## 2019-10-29 MED ORDER — GABAPENTIN 300 MG PO CAPS
ORAL_CAPSULE | ORAL | Status: AC
  Administered 2019-10-29: 300 mg
  Filled 2019-10-29: qty 1

## 2019-10-29 MED ORDER — SUCCINYLCHOLINE CHLORIDE 200 MG/10ML IV SOSY
PREFILLED_SYRINGE | INTRAVENOUS | Status: AC
  Filled 2019-10-29: qty 10

## 2019-10-29 MED ORDER — LACTATED RINGERS IV SOLN: INTRAVENOUS | Status: DC | PRN

## 2019-10-29 MED ORDER — AMITRIPTYLINE HCL 100 MG PO TABS
100.0000 mg | ORAL_TABLET | Freq: Every day | ORAL | Status: DC
  Administered 2019-10-29: 100 mg via ORAL
  Filled 2019-10-29 (×2): qty 1

## 2019-10-29 MED ORDER — ACETAMINOPHEN 160 MG/5ML PO SOLN: 325.0000 mg | Freq: Once | ORAL | Status: DC | PRN

## 2019-10-29 MED ORDER — FENTANYL CITRATE (PF) 250 MCG/5ML IJ SOLN
INTRAMUSCULAR | Status: DC | PRN
  Administered 2019-10-29: 50 ug via INTRAVENOUS
  Administered 2019-10-29: 100 ug via INTRAVENOUS

## 2019-10-29 MED ORDER — LIDOCAINE 2% (20 MG/ML) 5 ML SYRINGE
INTRAMUSCULAR | Status: DC | PRN
  Administered 2019-10-29: 40 mg via INTRAVENOUS

## 2019-10-29 MED ORDER — LIDOCAINE 2% (20 MG/ML) 5 ML SYRINGE
INTRAMUSCULAR | Status: AC
  Filled 2019-10-29: qty 5

## 2019-10-29 MED ORDER — FENTANYL CITRATE (PF) 100 MCG/2ML IJ SOLN: 25.0000 ug | INTRAMUSCULAR | Status: DC | PRN

## 2019-10-29 MED ORDER — CLONAZEPAM 0.5 MG PO TABS: 0.5000 mg | ORAL_TABLET | Freq: Two times a day (BID) | ORAL | Status: DC | PRN

## 2019-10-29 MED ORDER — DIPHENHYDRAMINE HCL 50 MG/ML IJ SOLN: 12.5000 mg | Freq: Four times a day (QID) | INTRAMUSCULAR | Status: DC | PRN

## 2019-10-29 MED ORDER — TRAMADOL HCL 50 MG PO TABS: 50.0000 mg | ORAL_TABLET | Freq: Four times a day (QID) | ORAL | Status: DC | PRN

## 2019-10-29 MED ORDER — EPHEDRINE SULFATE-NACL 50-0.9 MG/10ML-% IV SOSY
PREFILLED_SYRINGE | INTRAVENOUS | Status: DC | PRN
  Administered 2019-10-29: 10 mg via INTRAVENOUS
  Administered 2019-10-29: 15 mg via INTRAVENOUS

## 2019-10-29 MED ORDER — PHENYLEPHRINE HCL-NACL 10-0.9 MG/250ML-% IV SOLN
INTRAVENOUS | Status: DC | PRN
  Administered 2019-10-29: 50 ug/min via INTRAVENOUS

## 2019-10-29 MED ORDER — BUPIVACAINE HCL (PF) 0.25 % IJ SOLN
INTRAMUSCULAR | Status: AC
  Filled 2019-10-29: qty 30

## 2019-10-29 MED ORDER — LACTATED RINGERS IV SOLN: INTRAVENOUS | Status: DC

## 2019-10-29 MED ORDER — GABAPENTIN 300 MG PO CAPS: 300.0000 mg | ORAL_CAPSULE | ORAL | Status: DC

## 2019-10-29 MED ORDER — ENOXAPARIN SODIUM 30 MG/0.3ML ~~LOC~~ SOLN
30.0000 mg | SUBCUTANEOUS | Status: DC
  Administered 2019-10-30: 30 mg via SUBCUTANEOUS
  Filled 2019-10-29: qty 0.3

## 2019-10-29 MED ORDER — BUPIVACAINE HCL (PF) 0.25 % IJ SOLN
INTRAMUSCULAR | Status: DC | PRN
  Administered 2019-10-29: 20 mL

## 2019-10-29 MED ORDER — SUCCINYLCHOLINE CHLORIDE 200 MG/10ML IV SOSY
PREFILLED_SYRINGE | INTRAVENOUS | Status: DC | PRN
  Administered 2019-10-29: 140 mg via INTRAVENOUS

## 2019-10-29 MED ORDER — ALLOPURINOL 300 MG PO TABS
300.0000 mg | ORAL_TABLET | Freq: Every day | ORAL | Status: DC
  Administered 2019-10-29 – 2019-10-30 (×2): 300 mg via ORAL
  Filled 2019-10-29 (×2): qty 1

## 2019-10-29 MED ORDER — ACETAMINOPHEN 500 MG PO TABS
ORAL_TABLET | ORAL | Status: AC
  Administered 2019-10-29: 1000 mg
  Filled 2019-10-29: qty 2

## 2019-10-29 MED ORDER — FENTANYL CITRATE (PF) 250 MCG/5ML IJ SOLN
INTRAMUSCULAR | Status: AC
  Filled 2019-10-29: qty 5

## 2019-10-29 MED ORDER — ACETAMINOPHEN 500 MG PO TABS: 1000.0000 mg | ORAL_TABLET | ORAL | Status: DC

## 2019-10-29 MED ORDER — ATORVASTATIN CALCIUM 40 MG PO TABS
40.0000 mg | ORAL_TABLET | Freq: Every day | ORAL | Status: DC
  Administered 2019-10-29: 40 mg via ORAL
  Filled 2019-10-29: qty 1

## 2019-10-29 MED ORDER — ONDANSETRON 4 MG PO TBDP
4.0000 mg | ORAL_TABLET | Freq: Four times a day (QID) | ORAL | Status: DC | PRN
  Filled 2019-10-29: qty 1

## 2019-10-29 MED ORDER — STERILE WATER FOR IRRIGATION IR SOLN
Status: DC | PRN
Start: 2019-10-29 — End: 2019-10-29
  Administered 2019-10-29: 1000 mL

## 2019-10-29 SURGICAL SUPPLY — 36 items
BLADE CLIPPER SURG (BLADE) IMPLANT
CANISTER SUCT 3000ML PPV (MISCELLANEOUS) ×3 IMPLANT
CHLORAPREP W/TINT 26 (MISCELLANEOUS) ×3 IMPLANT
COVER SURGICAL LIGHT HANDLE (MISCELLANEOUS) ×3 IMPLANT
COVER WAND RF STERILE (DRAPES) IMPLANT
DERMABOND ADVANCED (GAUZE/BANDAGES/DRESSINGS) ×2
DERMABOND ADVANCED .7 DNX12 (GAUZE/BANDAGES/DRESSINGS) ×1 IMPLANT
DRAPE LAPAROSCOPIC ABDOMINAL (DRAPES) ×3 IMPLANT
DRSG TEGADERM 4X4.75 (GAUZE/BANDAGES/DRESSINGS) ×3 IMPLANT
DRSG TELFA 3X8 NADH (GAUZE/BANDAGES/DRESSINGS) IMPLANT
ELECT REM PT RETURN 9FT ADLT (ELECTROSURGICAL) ×3
ELECTRODE REM PT RTRN 9FT ADLT (ELECTROSURGICAL) ×1 IMPLANT
GAUZE SPONGE 4X4 12PLY STRL (GAUZE/BANDAGES/DRESSINGS) IMPLANT
GLOVE SURG SIGNA 7.5 PF LTX (GLOVE) ×3 IMPLANT
GOWN STRL REUS W/ TWL LRG LVL3 (GOWN DISPOSABLE) ×2 IMPLANT
GOWN STRL REUS W/ TWL XL LVL3 (GOWN DISPOSABLE) ×1 IMPLANT
GOWN STRL REUS W/TWL LRG LVL3 (GOWN DISPOSABLE) ×4
GOWN STRL REUS W/TWL XL LVL3 (GOWN DISPOSABLE) ×2
KIT BASIN OR (CUSTOM PROCEDURE TRAY) ×3 IMPLANT
KIT TURNOVER KIT B (KITS) ×3 IMPLANT
MESH VENTRALEX ST 2.5 CRC MED (Mesh General) ×3 IMPLANT
NEEDLE HYPO 25GX1X1/2 BEV (NEEDLE) ×3 IMPLANT
NS IRRIG 1000ML POUR BTL (IV SOLUTION) ×3 IMPLANT
PACK GENERAL/GYN (CUSTOM PROCEDURE TRAY) ×3 IMPLANT
PAD ARMBOARD 7.5X6 YLW CONV (MISCELLANEOUS) ×3 IMPLANT
PENCIL SMOKE EVACUATOR (MISCELLANEOUS) ×3 IMPLANT
STAPLER VISISTAT 35W (STAPLE) IMPLANT
SUT MNCRL AB 4-0 PS2 18 (SUTURE) ×3 IMPLANT
SUT NOVA NAB DX-16 0-1 5-0 T12 (SUTURE) ×3 IMPLANT
SUT PROLENE 1 CT (SUTURE) IMPLANT
SUT VIC AB 3-0 SH 27 (SUTURE) ×2
SUT VIC AB 3-0 SH 27XBRD (SUTURE) ×1 IMPLANT
SYR CONTROL 10ML LL (SYRINGE) ×3 IMPLANT
TOWEL GREEN STERILE (TOWEL DISPOSABLE) ×3 IMPLANT
TOWEL GREEN STERILE FF (TOWEL DISPOSABLE) ×3 IMPLANT
TRAY FOLEY MTR SLVR 14FR STAT (SET/KITS/TRAYS/PACK) IMPLANT

## 2019-10-29 NOTE — Discharge Instructions (Signed)
[facility] _______[facility]  UMBILICAL OR INGUINAL HERNIA REPAIR: POST OP INSTRUCTIONS  Always review your discharge instruction sheet given to you by the facility where your surgery was performed. IF YOU HAVE DISABILITY OR FAMILY LEAVE FORMS, YOU MUST BRING THEM TO THE OFFICE FOR PROCESSING.   DO NOT GIVE THEM TO YOUR DOCTOR.  1. A  prescription for pain medication may be given to you upon discharge.  Take your pain medication as prescribed, if needed.  If narcotic pain medicine is not needed, then you may take acetaminophen (Tylenol) or ibuprofen (Advil) as needed. 2. Take your usually prescribed medications unless otherwise directed. If you need a refill on your pain medication, please contact your pharmacy.  They will contact our office to request authorization. Prescriptions will not be filled after 5 pm or on week-ends. 3. You should follow a light diet the first 24 hours after arrival home, such as soup and crackers, etc.  Be sure to include lots of fluids daily.  Resume your normal diet the day after surgery. 4.Most patients will experience some swelling and bruising around the umbilicus or in the groin and scrotum.  Ice packs and reclining will help.  Swelling and bruising can take several days to resolve.  6. It is common to experience some constipation if taking pain medication after surgery.  Increasing fluid intake and taking a stool softener (such as Colace) will usually help or prevent this problem from occurring.  A mild laxative (Milk of Magnesia or Miralax) should be taken according to package directions if there are no bowel movements after 48 hours. 7. Unless discharge instructions indicate otherwise, you may remove your bandages 24-48 hours after surgery, and you may shower at that time.  You may have steri-strips (small skin tapes) in place directly over the incision.  These strips should be left on the skin for 7-10 days.  If your surgeon used skin glue on the  incision, you may shower in 24 hours.  The glue will flake off over the next 2-3 weeks.  Any sutures or staples will be removed at the office during your follow-up visit. 8. ACTIVITIES:  You may resume regular (light) daily activities beginning the next day--such as daily self-care, walking, climbing stairs--gradually increasing activities as tolerated.  You may have sexual intercourse when it is comfortable.  Refrain from any heavy lifting or straining until approved by your doctor.  a.You may drive when you are no longer taking prescription pain medication, you can comfortably wear a seatbelt, and you can safely maneuver your car and apply brakes. b.RETURN TO WORK:   _____________________________________________  9.You should see your doctor in the office for a follow-up appointment approximately 2-3 weeks after your surgery.  Make sure that you call for this appointment within a day or two after you arrive home to insure a convenient appointment time. 10.OTHER INSTRUCTIONS: ____OK TO SHOWER STARTING TOMORROW ICE PACK, TYLENOL ALSO FOR PAIN NO LIFTING MORE THAN 15 POUNDS FOR 4 WEEKS_____________________    _____________________________________  WHEN TO CALL YOUR DOCTOR: 1. Fever over 101.0 2. Inability to urinate 3. Nausea and/or vomiting 4. Extreme swelling or bruising 5. Continued bleeding from incision. 6. Increased pain, redness, or drainage from the incision  The clinic staff is available to answer your questions during regular business hours.  Please don't hesitate to call and ask to speak to one of the nurses for clinical concerns.  If you have a medical emergency, go to the nearest emergency room  or call 911.  A surgeon from [facility] is always on call at the hospital   [address] [place], [place], [place]  [address] ?  [address], [place], [place]   [address] [phone] ? [phone] ? FAX [phone] Web site: [email or web address]

## 2019-10-29 NOTE — Transfer of Care (Signed)
Immediate Anesthesia Transfer of Care Note  Patient: [name] [name]  Procedure(s) Performed: EPIGASTRIC HERNIA REPAIR WITH MESH (N/A Abdomen)  Patient Location: PACU  Anesthesia Type:General  Level of Consciousness: awake, alert  and oriented  Airway & Oxygen Therapy: Patient Spontanous Breathing and Patient connected to face mask oxygen  Post-op Assessment: Report given to RN, Post -op Vital signs reviewed and stable and Patient moving all extremities X 4  Post vital signs: Reviewed and stable  Last Vitals:  Vitals Value Taken Time  BP 99/55 10/29/19 1313  Temp    Pulse 81 10/29/19 1317  Resp 17 10/29/19 1317  SpO2 93 % 10/29/19 1317  Vitals shown include unvalidated device data.  Last Pain:  Vitals:   10/29/19 0945  TempSrc: Oral  PainSc: 0-No pain         Complications: No apparent anesthesia complications

## 2019-10-29 NOTE — Interval H&P Note (Signed)
History and Physical Interval Note:no change in H and P  10/29/2019 11:09 AM  [name] [name]  has presented today for surgery, with the diagnosis of EPIGASTRIC HERNIA.  The various methods of treatment have been discussed with the patient and family. After consideration of risks, benefits and other options for treatment, the patient has consented to  Procedure(s) with comments: [place] (N/A) - LMA ANESTHESIA as a surgical intervention.  The patient's history has been reviewed, patient examined, no change in status, stable for surgery.  I have reviewed the patient's chart and labs.  Questions were answered to the patient's satisfaction.     Coralie Keens

## 2019-10-29 NOTE — Op Note (Signed)
EPIGASTRIC HERNIA REPAIR WITH MESH  Procedure Note  KHAMRYN CALDERONE 10/29/2019   Pre-op Diagnosis: EPIGASTRIC HERNIA     Post-op Diagnosis: same  Procedure(s): EPIGASTRIC HERNIA REPAIR WITH MESH  (6.4 cm round prolene patch from [facility]) Surgeon(s): Coralie Keens, MD  Anesthesia: General  Staff:  Circulator: Rozell Searing, RN Relief Circulator: Small, Benjaman Lobe, RN Relief Scrub: Jeanie Cooks, RN Scrub Person: Virgel Gess, Mosetta Anis Circulator Assistant: Cyd Silence, RN  Estimated Blood Loss: Minimal               Procedure: The patient was brought to the operating room and identifies correct patient.  She is placed upon the operating table and anesthesia was induced.  Her abdomen was prepped and draped in the usual sterile fashion.  I anesthetized the skin in the epigastric area with Marcaine.  I then made a longitudinal incision with a scalpel over the top of the palpable hernia.  I dissected down to the hernia sac.  There was a moderate hernia sac with edema.  It contained only preperitoneal fat.  The actual fascial defect was less than 3 cm in size.  I reduce the sac completely.  I then brought a 6.4 cm round ventral patch onto the field.  This was a Prolene patch from [facility].  I placed it through the fascia opening and then pulled up against peritoneum with stay ties.  I sutured the mesh in place with #1 Novafil suture circumferentially.  I then cut the stay ties and closed the fascia over the top of the mesh with a figure-of-eight #1 Novafil suture.  Good closure of the fascial defect and wide fascial coverage with the mesh appeared to be achieved.  I anesthetized the fascia and skin further with Marcaine.  Hemostasis appeared to be achieved.  I then closed the subcutaneous tissue with interrupted 3-0 Vicryl sutures and closed skin with a running 4-0 Monocryl.  Dermabond was then applied.  Patient tolerated the procedure well.  All the counts were  correct at the end of the procedure.  The patient was then extubated in the operating room and taken in a stable condition to the recovery room.          Coralie Keens   Date: 10/29/2019  Time: 12:24 PM

## 2019-10-29 NOTE — Anesthesia Procedure Notes (Signed)
Procedure Name: Intubation Date/Time: 10/29/2019 11:57 AM Performed by: Harden Mo, CRNA Pre-anesthesia Checklist: Patient identified, Emergency Drugs available, Suction available and Patient being monitored Patient Re-evaluated:Patient Re-evaluated prior to induction Oxygen Delivery Method: Circle System Utilized Preoxygenation: Pre-oxygenation with 100% oxygen Induction Type: IV induction and Rapid sequence Laryngoscope Size: Glidescope and 4 Grade View: Grade I Tube type: Oral Tube size: 7.5 mm Number of attempts: 1 Airway Equipment and Method: Stylet and Oral airway Placement Confirmation: ETT inserted through vocal cords under direct vision,  positive ETCO2 and breath sounds checked- equal and bilateral Secured at: 22 cm Tube secured with: Tape Dental Injury: Teeth and Oropharynx as per pre-operative assessment

## 2019-10-29 NOTE — Anesthesia Postprocedure Evaluation (Signed)
Anesthesia Post Note  Patient: [name] [name]  Procedure(s) Performed: EPIGASTRIC HERNIA REPAIR WITH MESH (N/A Abdomen)     Patient location during evaluation: PACU Anesthesia Type: General Level of consciousness: awake and alert Pain management: pain level controlled Vital Signs Assessment: post-procedure vital signs reviewed and stable Respiratory status: spontaneous breathing, nonlabored ventilation, respiratory function stable and patient connected to nasal cannula oxygen Cardiovascular status: blood pressure returned to baseline and stable Postop Assessment: no apparent nausea or vomiting Anesthetic complications: no    Last Vitals:  Vitals:   10/29/19 1444 10/29/19 1512  BP: 107/60 96/81  Pulse:  89  Resp:  18  Temp:  36.6 C  SpO2:  96%    Last Pain:  Vitals:   10/29/19 1442  TempSrc:   PainSc: 0-No pain                 Effie Berkshire

## 2019-10-30 LAB — CBC
HCT: 37.4 % (ref 36.0–46.0)
Hemoglobin: 10.5 g/dL — ABNORMAL LOW (ref 12.0–15.0)
MCH: 29.3 pg (ref 26.0–34.0)
MCHC: 28.1 g/dL — ABNORMAL LOW (ref 30.0–36.0)
MCV: 104.5 fL — ABNORMAL HIGH (ref 80.0–100.0)
Platelets: 136 10*3/uL — ABNORMAL LOW (ref 150–400)
RBC: 3.58 MIL/uL — ABNORMAL LOW (ref 3.87–5.11)
RDW: 18.1 % — ABNORMAL HIGH (ref 11.5–15.5)
WBC: 9.3 10*3/uL (ref 4.0–10.5)
nRBC: 0 % (ref 0.0–0.2)

## 2019-10-30 NOTE — Progress Notes (Signed)
Patient ID: [name] [name], female   DOB: [birth year], 74 y.o.   MRN: [id number]   Doing well Pain controlled Abdomen soft, incision clean  Plan: Discharged home

## 2019-10-30 NOTE — Plan of Care (Signed)
Patient alert and oriented, mae's well, voiding adequate amount of urine, swallowing without difficulty, no c/o pain at time of discharge. Patient discharged home with family. Script and discharged instructions given to patient. Patient and family stated understanding of instructions given. Patient has an appointment with Dr. Ninfa Linden

## 2019-10-30 NOTE — Discharge Summary (Signed)
Physician Discharge Summary  Patient ID: [name] [name] MRN: [id number] DOB/AGE: June 24, 1946 74 y.o.  Admit date: 10/29/2019 Discharge date: 10/30/2019  Admission Diagnoses:  Discharge Diagnoses:  Active Problems:   Epigastric hernia   Discharged Condition: good  Hospital Course: uneventful post op recovery.  Discharged home POD#1  Consults: None  Significant Diagnostic Studies:   Treatments: surgery: epigastric hernia repair with mesh  Discharge Exam: Blood pressure 99/64, pulse 85, temperature 97.8 F (36.6 C), temperature source Oral, resp. rate 18, height 5' 2"  (1.575 m), weight 132.9 kg, SpO2 94 %. General appearance: alert, cooperative and no distress Resp: clear to auscultation bilaterally Cardio: regular rate and rhythm, S1, S2 normal, no murmur, click, rub or gallop Incision/Wound:abdomen soft, incision clean  Disposition: Discharge disposition: 01-Home or Self Care       Discharge Instructions    Diet - low sodium heart healthy   Complete by: As directed    Increase activity slowly   Complete by: As directed      Allergies as of 10/30/2019      Reactions   Cephalexin Other (See Comments)   Exfoliative dermatitis reaction   Ciprofloxacin Other (See Comments)   foliculitis   Lisinopril Cough   Codeine    REACTION: unknown - pt. states unaware of having reaction to codeine   Cleocin [clindamycin Hcl] Rash   Dilaudid [hydromorphone] Other (See Comments)   Oversedation   Doxycycline Rash   Penicillins Rash   Did it involve swelling of the face/tongue/throat, SOB, or low BP? No Did it involve sudden or severe rash/hives, skin peeling, or any reaction on the inside of your mouth or nose? Yes Did you need to seek medical attention at a hospital or doctor's office?Yes When did it last happen?several years ago If all above answers are "NO", may proceed with cephalosporin use.      Medication List    STOP taking these medications   ProAir HFA 108  (90 Base) MCG/ACT inhaler Generic drug: albuterol     TAKE these medications   allopurinol 300 MG tablet Commonly known as: ZYLOPRIM TAKE 1 TABLET DAILY   amitriptyline 100 MG tablet Commonly known as: ELAVIL Take 1 tablet (100 mg total) by mouth at bedtime.   apixaban 5 MG Tabs tablet Commonly known as: Eliquis Take 1 tablet (5 mg total) by mouth 2 (two) times daily.   atorvastatin 40 MG tablet Commonly known as: LIPITOR TAKE 1 TABLET DAILY What changed: when to take this   AZO-CRANBERRY PO Take 1 tablet by mouth daily as needed (urinary symptoms).   budesonide-formoterol 160-4.5 MCG/ACT inhaler Commonly known as: Symbicort Inhale 2 puffs into the lungs 2 (two) times daily.   cholecalciferol 25 MCG (1000 UNIT) tablet Commonly known as: VITAMIN D3 Take 2,000 Units by mouth daily.   clonazePAM 0.5 MG tablet Commonly known as: KLONOPIN Take 1 tablet (0.5 mg total) by mouth daily as needed for anxiety.   DULoxetine 30 MG capsule Commonly known as: CYMBALTA Take 2 capsules (60 mg total) by mouth at bedtime.   ferrous sulfate 325 (65 FE) MG tablet Take 325 mg by mouth at bedtime.   furosemide 40 MG tablet Commonly known as: LASIX Take 40 mg (1 tab) by mouth in the morning. Take 20 mg (1/2 tab) in the evening. What changed:   how much to take  how to take this  when to take this  additional instructions   levothyroxine 200 MCG tablet Commonly known as: SYNTHROID Take 1  tablet (200 mcg total) by mouth daily.   metoprolol tartrate 25 MG tablet Commonly known as: LOPRESSOR Take 0.5 tablets (12.5 mg total) by mouth as needed (for heart rate above 120 at rest). What changed:   how much to take  when to take this   Mucinex Maximum Strength 1200 MG Tb12 Generic drug: Guaifenesin Take 1,200 mg by mouth 2 (two) times daily.   omeprazole 40 MG capsule Commonly known as: PRILOSEC Take 1 capsule (40 mg total) by mouth daily. What changed:   when to take  this  reasons to take this   OXYGEN 2lpm with CPAP at night and with exertion   potassium chloride SA 20 MEQ tablet Commonly known as: KLOR-CON TAKE 1 TABLET TWICE DAILY   traMADol 50 MG tablet Commonly known as: ULTRAM Take 1 tablet (50 mg total) by mouth daily as needed for moderate pain. What changed: Another medication with the same name was added. Make sure you understand how and when to take each.   traMADol 50 MG tablet Commonly known as: ULTRAM Take 1 tablet (50 mg total) by mouth every 6 (six) hours as needed for moderate pain. What changed: You were already taking a medication with the same name, and this prescription was added. Make sure you understand how and when to take each.   VITAMIN B COMPLEX PO Take 1 tablet by mouth daily.   vitamin C 500 MG tablet Commonly known as: ASCORBIC ACID Take 500 mg by mouth daily.   VITAMIN E PO Take 450 Units by mouth daily.      Follow-up Information    Coralie Keens, MD. Schedule an appointment as soon as possible for a visit in 3 weeks.   Specialty: General Surgery Contact information: [place] [address] [phone]           Signed: Coralie Keens 10/30/2019, 9:22 AM

## 2019-10-31 ENCOUNTER — Telehealth: Payer: Self-pay | Admitting: *Deleted

## 2019-10-31 NOTE — Telephone Encounter (Signed)
Pt was on TCM report admitted 10/29/19 for Epigastric hernia. Pt underwent an epigastric hernia repair with mesh. Tolerated procedure well, and D/C 10/30/19. Pt will follow=up w/Dr. Ninfa Linden in 1-2 weeks.Marland KitchenJohny Chess

## 2019-11-01 ENCOUNTER — Emergency Department (HOSPITAL_COMMUNITY): Payer: Medicare HMO

## 2019-11-01 ENCOUNTER — Other Ambulatory Visit: Payer: Self-pay

## 2019-11-01 ENCOUNTER — Inpatient Hospital Stay (HOSPITAL_COMMUNITY): Payer: Medicare HMO

## 2019-11-01 ENCOUNTER — Encounter (HOSPITAL_COMMUNITY): Payer: Self-pay | Admitting: Emergency Medicine

## 2019-11-01 ENCOUNTER — Inpatient Hospital Stay (HOSPITAL_COMMUNITY)
Admission: EM | Admit: 2019-11-01 | Discharge: 2019-11-06 | DRG: 987 | Disposition: A | Discharge status: Home Health | Payer: Medicare HMO | Attending: Internal Medicine | Admitting: Internal Medicine

## 2019-11-01 DIAGNOSIS — R4182 Altered mental status, unspecified: Secondary | ICD-10-CM

## 2019-11-01 DIAGNOSIS — Z6841 Body Mass Index (BMI) 40.0 and over, adult: Secondary | ICD-10-CM | POA: Diagnosis not present

## 2019-11-01 DIAGNOSIS — E875 Hyperkalemia: Secondary | ICD-10-CM | POA: Diagnosis present

## 2019-11-01 DIAGNOSIS — Z9981 Dependence on supplemental oxygen: Secondary | ICD-10-CM

## 2019-11-01 DIAGNOSIS — Z803 Family history of malignant neoplasm of breast: Secondary | ICD-10-CM

## 2019-11-01 DIAGNOSIS — Z880 Allergy status to penicillin: Secondary | ICD-10-CM

## 2019-11-01 DIAGNOSIS — Z8041 Family history of malignant neoplasm of ovary: Secondary | ICD-10-CM

## 2019-11-01 DIAGNOSIS — E860 Dehydration: Secondary | ICD-10-CM | POA: Diagnosis not present

## 2019-11-01 DIAGNOSIS — Z87891 Personal history of nicotine dependence: Secondary | ICD-10-CM | POA: Diagnosis not present

## 2019-11-01 DIAGNOSIS — R0689 Other abnormalities of breathing: Secondary | ICD-10-CM | POA: Diagnosis not present

## 2019-11-01 DIAGNOSIS — I2510 Atherosclerotic heart disease of native coronary artery without angina pectoris: Secondary | ICD-10-CM | POA: Diagnosis present

## 2019-11-01 DIAGNOSIS — Z7901 Long term (current) use of anticoagulants: Secondary | ICD-10-CM

## 2019-11-01 DIAGNOSIS — K439 Ventral hernia without obstruction or gangrene: Secondary | ICD-10-CM | POA: Diagnosis present

## 2019-11-01 DIAGNOSIS — Z881 Allergy status to other antibiotic agents status: Secondary | ICD-10-CM

## 2019-11-01 DIAGNOSIS — Z923 Personal history of irradiation: Secondary | ICD-10-CM

## 2019-11-01 DIAGNOSIS — I959 Hypotension, unspecified: Secondary | ICD-10-CM | POA: Diagnosis not present

## 2019-11-01 DIAGNOSIS — Z8249 Family history of ischemic heart disease and other diseases of the circulatory system: Secondary | ICD-10-CM | POA: Diagnosis not present

## 2019-11-01 DIAGNOSIS — J9610 Chronic respiratory failure, unspecified whether with hypoxia or hypercapnia: Secondary | ICD-10-CM | POA: Diagnosis present

## 2019-11-01 DIAGNOSIS — J9621 Acute and chronic respiratory failure with hypoxia: Secondary | ICD-10-CM | POA: Diagnosis not present

## 2019-11-01 DIAGNOSIS — I130 Hypertensive heart and chronic kidney disease with heart failure and stage 1 through stage 4 chronic kidney disease, or unspecified chronic kidney disease: Secondary | ICD-10-CM | POA: Diagnosis not present

## 2019-11-01 DIAGNOSIS — I480 Paroxysmal atrial fibrillation: Secondary | ICD-10-CM | POA: Diagnosis not present

## 2019-11-01 DIAGNOSIS — E785 Hyperlipidemia, unspecified: Secondary | ICD-10-CM | POA: Diagnosis present

## 2019-11-01 DIAGNOSIS — I495 Sick sinus syndrome: Secondary | ICD-10-CM | POA: Diagnosis present

## 2019-11-01 DIAGNOSIS — G4733 Obstructive sleep apnea (adult) (pediatric): Secondary | ICD-10-CM | POA: Diagnosis present

## 2019-11-01 DIAGNOSIS — G9340 Encephalopathy, unspecified: Secondary | ICD-10-CM | POA: Diagnosis not present

## 2019-11-01 DIAGNOSIS — I5033 Acute on chronic diastolic (congestive) heart failure: Secondary | ICD-10-CM | POA: Diagnosis not present

## 2019-11-01 DIAGNOSIS — Z79899 Other long term (current) drug therapy: Secondary | ICD-10-CM

## 2019-11-01 DIAGNOSIS — G9341 Metabolic encephalopathy: Secondary | ICD-10-CM | POA: Diagnosis not present

## 2019-11-01 DIAGNOSIS — R609 Edema, unspecified: Secondary | ICD-10-CM | POA: Diagnosis not present

## 2019-11-01 DIAGNOSIS — K567 Ileus, unspecified: Secondary | ICD-10-CM | POA: Diagnosis not present

## 2019-11-01 DIAGNOSIS — Z950 Presence of cardiac pacemaker: Secondary | ICD-10-CM

## 2019-11-01 DIAGNOSIS — Z20822 Contact with and (suspected) exposure to covid-19: Secondary | ICD-10-CM | POA: Diagnosis present

## 2019-11-01 DIAGNOSIS — R4020 Unspecified coma: Secondary | ICD-10-CM | POA: Diagnosis not present

## 2019-11-01 DIAGNOSIS — J449 Chronic obstructive pulmonary disease, unspecified: Secondary | ICD-10-CM | POA: Diagnosis present

## 2019-11-01 DIAGNOSIS — R404 Transient alteration of awareness: Secondary | ICD-10-CM | POA: Diagnosis not present

## 2019-11-01 DIAGNOSIS — F329 Major depressive disorder, single episode, unspecified: Secondary | ICD-10-CM | POA: Diagnosis present

## 2019-11-01 DIAGNOSIS — Z9221 Personal history of antineoplastic chemotherapy: Secondary | ICD-10-CM | POA: Diagnosis not present

## 2019-11-01 DIAGNOSIS — R0602 Shortness of breath: Secondary | ICD-10-CM | POA: Diagnosis not present

## 2019-11-01 DIAGNOSIS — N1832 Chronic kidney disease, stage 3b: Secondary | ICD-10-CM | POA: Diagnosis present

## 2019-11-01 DIAGNOSIS — I4891 Unspecified atrial fibrillation: Secondary | ICD-10-CM | POA: Diagnosis present

## 2019-11-01 DIAGNOSIS — J9622 Acute and chronic respiratory failure with hypercapnia: Secondary | ICD-10-CM | POA: Diagnosis not present

## 2019-11-01 DIAGNOSIS — N179 Acute kidney failure, unspecified: Secondary | ICD-10-CM | POA: Diagnosis not present

## 2019-11-01 DIAGNOSIS — R188 Other ascites: Secondary | ICD-10-CM | POA: Diagnosis not present

## 2019-11-01 DIAGNOSIS — Z833 Family history of diabetes mellitus: Secondary | ICD-10-CM

## 2019-11-01 DIAGNOSIS — D649 Anemia, unspecified: Secondary | ICD-10-CM | POA: Diagnosis present

## 2019-11-01 DIAGNOSIS — Z8049 Family history of malignant neoplasm of other genital organs: Secondary | ICD-10-CM

## 2019-11-01 DIAGNOSIS — M109 Gout, unspecified: Secondary | ICD-10-CM | POA: Diagnosis present

## 2019-11-01 DIAGNOSIS — E039 Hypothyroidism, unspecified: Secondary | ICD-10-CM | POA: Diagnosis present

## 2019-11-01 DIAGNOSIS — M2550 Pain in unspecified joint: Secondary | ICD-10-CM | POA: Diagnosis not present

## 2019-11-01 DIAGNOSIS — R0902 Hypoxemia: Secondary | ICD-10-CM | POA: Diagnosis not present

## 2019-11-01 DIAGNOSIS — Z7401 Bed confinement status: Secondary | ICD-10-CM | POA: Diagnosis not present

## 2019-11-01 LAB — COMPREHENSIVE METABOLIC PANEL
ALT: 20 U/L (ref 0–44)
AST: 31 U/L (ref 15–41)
Albumin: 3.3 g/dL — ABNORMAL LOW (ref 3.5–5.0)
Alkaline Phosphatase: 144 U/L — ABNORMAL HIGH (ref 38–126)
Anion gap: 10 (ref 5–15)
BUN: 26 mg/dL — ABNORMAL HIGH (ref 8–23)
CO2: 32 mmol/L (ref 22–32)
Calcium: 9.1 mg/dL (ref 8.9–10.3)
Chloride: 99 mmol/L (ref 98–111)
Creatinine, Ser: 1.89 mg/dL — ABNORMAL HIGH (ref 0.44–1.00)
GFR calc Af Amer: 30 mL/min — ABNORMAL LOW (ref >60)
GFR calc non Af Amer: 26 mL/min — ABNORMAL LOW (ref >60)
Glucose, Bld: 89 mg/dL (ref 70–99)
Potassium: 5.6 mmol/L — ABNORMAL HIGH (ref 3.5–5.1)
Sodium: 141 mmol/L (ref 135–145)
Total Bilirubin: 1.5 mg/dL — ABNORMAL HIGH (ref 0.3–1.2)
Total Protein: 6.6 g/dL (ref 6.5–8.1)

## 2019-11-01 LAB — CBG MONITORING, ED: Glucose-Capillary: 79 mg/dL (ref 70–99)

## 2019-11-01 LAB — CBC WITH DIFFERENTIAL/PLATELET
Abs Immature Granulocytes: 0.03 10*3/uL (ref 0.00–0.07)
Basophils Absolute: 0 10*3/uL (ref 0.0–0.1)
Basophils Relative: 0 %
Eosinophils Absolute: 0.2 10*3/uL (ref 0.0–0.5)
Eosinophils Relative: 3 %
HCT: 38.1 % (ref 36.0–46.0)
Hemoglobin: 10.8 g/dL — ABNORMAL LOW (ref 12.0–15.0)
Immature Granulocytes: 0 %
Lymphocytes Relative: 9 %
Lymphs Abs: 0.6 10*3/uL — ABNORMAL LOW (ref 0.7–4.0)
MCH: 29.6 pg (ref 26.0–34.0)
MCHC: 28.3 g/dL — ABNORMAL LOW (ref 30.0–36.0)
MCV: 104.4 fL — ABNORMAL HIGH (ref 80.0–100.0)
Monocytes Absolute: 0.6 10*3/uL (ref 0.1–1.0)
Monocytes Relative: 8 %
Neutro Abs: 5.9 10*3/uL (ref 1.7–7.7)
Neutrophils Relative %: 80 %
Platelets: 188 10*3/uL (ref 150–400)
RBC: 3.65 MIL/uL — ABNORMAL LOW (ref 3.87–5.11)
RDW: 17.8 % — ABNORMAL HIGH (ref 11.5–15.5)
WBC: 7.4 10*3/uL (ref 4.0–10.5)
nRBC: 0.4 % — ABNORMAL HIGH (ref 0.0–0.2)

## 2019-11-01 LAB — TROPONIN I (HIGH SENSITIVITY)
Troponin I (High Sensitivity): 221 ng/L (ref <18)
Troponin I (High Sensitivity): 192 ng/L (ref <18)

## 2019-11-01 LAB — PROTIME-INR
INR: 1.7 — ABNORMAL HIGH (ref 0.8–1.2)
Prothrombin Time: 19.7 seconds — ABNORMAL HIGH (ref 11.4–15.2)

## 2019-11-01 LAB — RESPIRATORY PANEL BY RT PCR (FLU A&B, COVID)
Influenza A by PCR: NEGATIVE
Influenza B by PCR: NEGATIVE
SARS Coronavirus 2 by RT PCR: NEGATIVE

## 2019-11-01 LAB — BRAIN NATRIURETIC PEPTIDE: B Natriuretic Peptide: 1065.5 pg/mL — ABNORMAL HIGH (ref 0.0–100.0)

## 2019-11-01 LAB — TYPE AND SCREEN
ABO/RH(D): O POS
Antibody Screen: NEGATIVE

## 2019-11-01 LAB — LACTIC ACID, PLASMA
Lactic Acid, Venous: 1.5 mmol/L (ref 0.5–1.9)
Lactic Acid, Venous: 1.6 mmol/L (ref 0.5–1.9)

## 2019-11-01 LAB — ABO/RH: ABO/RH(D): O POS

## 2019-11-01 MED ORDER — FUROSEMIDE 20 MG PO TABS: 60.0000 mg | ORAL_TABLET | Freq: Two times a day (BID) | ORAL | Status: DC

## 2019-11-01 MED ORDER — SODIUM CHLORIDE 0.9 % IV SOLN: Freq: Once | INTRAVENOUS | Status: DC

## 2019-11-01 MED ORDER — FERROUS SULFATE 325 (65 FE) MG PO TABS
325.0000 mg | ORAL_TABLET | Freq: Every day | ORAL | Status: DC
  Administered 2019-11-02 – 2019-11-05 (×5): 325 mg via ORAL
  Filled 2019-11-01 (×5): qty 1

## 2019-11-01 MED ORDER — FUROSEMIDE 10 MG/ML IJ SOLN
60.0000 mg | Freq: Two times a day (BID) | INTRAMUSCULAR | Status: DC
  Administered 2019-11-02: 60 mg via INTRAVENOUS
  Filled 2019-11-01: qty 6

## 2019-11-01 MED ORDER — SODIUM CHLORIDE 0.9 % IV SOLN: INTRAVENOUS | Status: DC

## 2019-11-01 MED ORDER — DULOXETINE HCL 60 MG PO CPEP
60.0000 mg | ORAL_CAPSULE | Freq: Every day | ORAL | Status: DC
  Administered 2019-11-02 – 2019-11-05 (×4): 60 mg via ORAL
  Filled 2019-11-01 (×5): qty 1

## 2019-11-01 MED ORDER — SODIUM CHLORIDE 0.9 % IV BOLUS
500.0000 mL | Freq: Once | INTRAVENOUS | Status: AC
  Administered 2019-11-01: 17:00:00 500 mL via INTRAVENOUS

## 2019-11-01 MED ORDER — GUAIFENESIN ER 600 MG PO TB12
1200.0000 mg | ORAL_TABLET | Freq: Two times a day (BID) | ORAL | Status: DC
  Administered 2019-11-02 – 2019-11-06 (×10): 1200 mg via ORAL
  Filled 2019-11-01 (×10): qty 2

## 2019-11-01 MED ORDER — CLONAZEPAM 0.5 MG PO TABS: 0.5000 mg | ORAL_TABLET | Freq: Every day | ORAL | Status: DC | PRN

## 2019-11-01 MED ORDER — FENTANYL CITRATE (PF) 100 MCG/2ML IJ SOLN: 50.0000 ug | INTRAMUSCULAR | Status: DC | PRN

## 2019-11-01 MED ORDER — AMITRIPTYLINE HCL 25 MG PO TABS
100.0000 mg | ORAL_TABLET | Freq: Every day | ORAL | Status: DC
  Administered 2019-11-02 – 2019-11-05 (×4): 100 mg via ORAL
  Filled 2019-11-01 (×2): qty 4
  Filled 2019-11-01: qty 2
  Filled 2019-11-01 (×2): qty 4

## 2019-11-01 MED ORDER — ONDANSETRON 4 MG PO TBDP
4.0000 mg | ORAL_TABLET | Freq: Four times a day (QID) | ORAL | Status: DC | PRN
  Filled 2019-11-01: qty 1

## 2019-11-01 MED ORDER — TRAMADOL HCL 50 MG PO TABS
50.0000 mg | ORAL_TABLET | Freq: Every day | ORAL | Status: DC | PRN
  Administered 2019-11-04: 50 mg via ORAL
  Filled 2019-11-01 (×2): qty 1

## 2019-11-01 MED ORDER — DIPHENHYDRAMINE HCL 12.5 MG/5ML PO ELIX: 12.5000 mg | ORAL_SOLUTION | Freq: Four times a day (QID) | ORAL | Status: DC | PRN

## 2019-11-01 MED ORDER — FENTANYL CITRATE (PF) 100 MCG/2ML IJ SOLN: 25.0000 ug | Freq: Four times a day (QID) | INTRAMUSCULAR | Status: DC | PRN

## 2019-11-01 MED ORDER — METOPROLOL TARTRATE 25 MG PO TABS: 25.0000 mg | ORAL_TABLET | Freq: Two times a day (BID) | ORAL | Status: DC | PRN

## 2019-11-01 MED ORDER — ONDANSETRON HCL 4 MG/2ML IJ SOLN: 4.0000 mg | Freq: Four times a day (QID) | INTRAMUSCULAR | Status: DC | PRN

## 2019-11-01 MED ORDER — APIXABAN 5 MG PO TABS: 5.0000 mg | ORAL_TABLET | Freq: Two times a day (BID) | ORAL | Status: DC

## 2019-11-01 MED ORDER — LEVOTHYROXINE SODIUM 100 MCG PO TABS
200.0000 ug | ORAL_TABLET | Freq: Every day | ORAL | Status: DC
  Administered 2019-11-02 – 2019-11-06 (×5): 200 ug via ORAL
  Filled 2019-11-01 (×5): qty 2

## 2019-11-01 MED ORDER — ALLOPURINOL 300 MG PO TABS
300.0000 mg | ORAL_TABLET | Freq: Every day | ORAL | Status: DC
  Administered 2019-11-02 – 2019-11-06 (×5): 300 mg via ORAL
  Filled 2019-11-01 (×5): qty 1

## 2019-11-01 MED ORDER — DIPHENHYDRAMINE HCL 50 MG/ML IJ SOLN: 12.5000 mg | Freq: Four times a day (QID) | INTRAMUSCULAR | Status: DC | PRN

## 2019-11-01 NOTE — ED Notes (Signed)
Difficulty starting IV/drawing blood (pitting edema all over) Can only use one extremity (see PMHx) IV team consulted STAT

## 2019-11-01 NOTE — H&P (Signed)
[name] [name] is an 74 y.o. female.   Chief Complaint: lethargy and poor PO intake HPI: Patient is status post epigastric hernia repair with mesh by Dr. Nedra Hai on 10/29/2019.  She was discharged on 10/30/2019.  Since being at home, her daughter reports that she has not been taking much by mouth.  She has not been moving around very well.  This persisted and she was a little bit confused today so her daughter brought her to the emergency department.  Evaluation here demonstrates dehydration and likely moderate postoperative ileus.  I was asked to see her for admission.  I called her daughter on the phone to get some of the history.  She has not had a bowel movement for a couple days either.  Subjective fever at home.  Past Medical History:  Diagnosis Date  . Anemia   . Asthma 06/13/2011   hx of  . Atrial fibrillation ([place])   . Atrial flutter ([place])   . Breast cancer ([place]) dx'd 2005   No BP check or stick in Left arm  . CAD (coronary artery disease)    a. Nonobstructive CAD 2007 (EF 75%.  RCA  proximal 75% tubular, 25% prox LAD, 25% d1, 50% D2) at [facility]. b. NSTEMI 01/2010 in setting of respiratory failure, medical approach (not a good candidate for noninvasive eval)  . Cellulitis    hx of cellulitis in left leg  . Cervical radiculopathy 06/13/2011  . CHF (congestive heart failure) ([place])   . Complication of anesthesia    per patient "hard to wake up and come out of the aneshthesia, happened years ago"  . COPD (chronic obstructive pulmonary disease) ([place])    on O2  . Depression 06/13/2011  . Diverticulosis   . Esophageal dysmotility   . Fatty liver   . Gout 06/13/2011  . History of Clostridium difficile early dec 2015   no current diarrhea  . History of home oxygen therapy    2 liters per nasal cannula all the time  . Hx of dysfunctional uterine bleeding    last bleeding jan 2016, worked up for with d and c x 2 no cause found  . Hyperlipidemia   . Hypertension   . Hypothyroidism   .  Impaired glucose tolerance 11/01/2012  . Myocardial infarction ([place])   . Neuropathy 06/13/2011  . Neuropathy    per patient in hands and in feet  . Obesity hypoventilation syndrome ([place]) 06/13/2011  . OSA on CPAP   . Personal history of chemotherapy   . Personal history of radiation therapy   . PFO (patent foramen ovale)   . Presence of permanent cardiac pacemaker   . Risk for falls   . Syncope and collapse    Bradycardia with pauses. S/P pacemaker  . Tubular adenoma of colon     Past Surgical History:  Procedure Laterality Date  . APPENDECTOMY    . BREAST BIOPSY    . BREAST LUMPECTOMY Left 05/2004  . CARDIOVERSION N/A 03/15/2013   Procedure: CARDIOVERSION;  Surgeon: Thayer Headings, MD;  Location: [facility] ENDOSCOPY;  Service: Cardiovascular;  Laterality: N/A;  . CERVICAL BIOPSY  June 2013   at [facility] for Heavy  Bleeding  . [place] SURGERY  2004 or 2005   have cadaver bones,, screws, and plates c 5 to c 6  . COLONOSCOPY    . COLONOSCOPY WITH PROPOFOL N/A 10/22/2014   Procedure: COLONOSCOPY WITH PROPOFOL;  Surgeon: Jerene Bears, MD;  Location: [facility] ENDOSCOPY;  Service: Gastroenterology;  Laterality: N/A;  . DILATION AND CURETTAGE OF UTERUS    . EPIGASTRIC HERNIA REPAIR    . EPIGASTRIC HERNIA REPAIR N/A 10/29/2019   Procedure: EPIGASTRIC HERNIA REPAIR WITH MESH;  Surgeon: Coralie Keens, MD;  Location: [facility];  Service: General;  Laterality: N/A;  LMA ANESTHESIA  . EYE SURGERY    . LAPAROSCOPIC APPENDECTOMY  03/29/2012   Procedure: APPENDECTOMY LAPAROSCOPIC;  Surgeon: Adin Hector, MD;  Location: [facility] ORS;  Service: General;  Laterality: N/A;  . LOOP RECORDER EXPLANT  08/31/2013   Procedure: LOOP RECORDER EXPLANT;  Surgeon: Coralyn Mark, MD;  Location: [facility] CATH LAB;  Service: Cardiovascular;;  . LOOP RECORDER IMPLANT  08-28-2013; 08-31-2013   MDT LinQ implanted by Dr Rayann Heman for syncope; explanted 08-31-2013 after sinus pauses identified  . LOOP RECORDER IMPLANT N/A 08/28/2013    Procedure: LOOP RECORDER IMPLANT;  Surgeon: Coralyn Mark, MD;  Location: [facility] CATH LAB;  Service: Cardiovascular;  Laterality: N/A;  . OVARIAN CYST REMOVAL    . PACEMAKER INSERTION  08-31-2013   Biotronik Evera dual chamber pacemaker placed for neurocardiogenic syncope and sinus pauses by Dr Rayann Heman  . PERMANENT PACEMAKER INSERTION N/A 08/31/2013   Procedure: PERMANENT PACEMAKER INSERTION;  Surgeon: Coralyn Mark, MD;  Location: [facility] CATH LAB;  Service: Cardiovascular;  Laterality: N/A;  . TEE WITHOUT CARDIOVERSION N/A 03/15/2013   Procedure: TRANSESOPHAGEAL ECHOCARDIOGRAM (TEE);  Surgeon: Thayer Headings, MD;  Location: [facility] ENDOSCOPY;  Service: Cardiovascular;  Laterality: N/A;    Family History  Problem Relation Age of Onset  . Uterine cancer Mother   . Heart disease Mother   . Breast cancer Sister 4  . Ovarian cancer Sister   . Breast cancer Cousin 40  . Diabetes Brother   . Breast cancer Maternal Aunt   . Ovarian cancer Sister    Social History:  reports that she quit smoking about 21 years ago. Her smoking use included cigarettes. She has a 20.00 pack-year smoking history. She has never used smokeless tobacco. She reports current alcohol use. She reports that she does not use drugs.  Allergies:  Allergies  Allergen Reactions  . Cephalexin Other (See Comments)    Exfoliative dermatitis reaction  . Ciprofloxacin Other (See Comments)    foliculitis  . Lisinopril Cough  . Codeine     REACTION: unknown - pt. states unaware of having reaction to codeine  . Cleocin [Clindamycin Hcl] Rash  . Dilaudid [Hydromorphone] Other (See Comments)    Oversedation  . Doxycycline Rash  . Penicillins Rash    Did it involve swelling of the face/tongue/throat, SOB, or low BP? No Did it involve sudden or severe rash/hives, skin peeling, or any reaction on the inside of your mouth or nose? Yes Did you need to seek medical attention at a hospital or doctor's office?Yes When did it last  happen?several years ago If all above answers are "NO", may proceed with cephalosporin use.    (Not in a hospital admission)   Results for orders placed or performed during the hospital encounter of 11/01/19 (from the past 48 hour(s))  CBG monitoring, ED     Status: None   Collection Time: 11/01/19  3:53 PM  Result Value Ref Range   Glucose-Capillary 79 70 - 99 mg/dL   Comment 1 Notify RN    Comment 2 Document in Chart   Comprehensive metabolic panel     Status: Abnormal   Collection Time: 11/01/19  4:22 PM  Result Value  Ref Range   Sodium 141 135 - 145 mmol/L   Potassium 5.6 (H) 3.5 - 5.1 mmol/L    Comment: HEMOLYSIS AT THIS LEVEL MAY AFFECT RESULT   Chloride 99 98 - 111 mmol/L   CO2 32 22 - 32 mmol/L   Glucose, Bld 89 70 - 99 mg/dL   BUN 26 (H) 8 - 23 mg/dL   Creatinine, Ser 1.89 (H) 0.44 - 1.00 mg/dL   Calcium 9.1 8.9 - 10.3 mg/dL   Total Protein 6.6 6.5 - 8.1 g/dL   Albumin 3.3 (L) 3.5 - 5.0 g/dL   AST 31 15 - 41 U/L   ALT 20 0 - 44 U/L   Alkaline Phosphatase 144 (H) 38 - 126 U/L   Total Bilirubin 1.5 (H) 0.3 - 1.2 mg/dL   GFR calc non Af Amer 26 (L) >60 mL/min   GFR calc Af Amer 30 (L) >60 mL/min   Anion gap 10 5 - 15    Comment: Performed at [facility] [address] [place], [place] [address]  CBC with Differential     Status: Abnormal   Collection Time: 11/01/19  4:22 PM  Result Value Ref Range   WBC 7.4 4.0 - 10.5 K/uL   RBC 3.65 (L) 3.87 - 5.11 MIL/uL   Hemoglobin 10.8 (L) 12.0 - 15.0 g/dL   HCT 38.1 36.0 - 46.0 %   MCV 104.4 (H) 80.0 - 100.0 fL   MCH 29.6 26.0 - 34.0 pg   MCHC 28.3 (L) 30.0 - 36.0 g/dL   RDW 17.8 (H) 11.5 - 15.5 %   Platelets 188 150 - 400 K/uL   nRBC 0.4 (H) 0.0 - 0.2 %   Neutrophils Relative % 80 %   Neutro Abs 5.9 1.7 - 7.7 K/uL   Lymphocytes Relative 9 %   Lymphs Abs 0.6 (L) 0.7 - 4.0 K/uL   Monocytes Relative 8 %   Monocytes Absolute 0.6 0.1 - 1.0 K/uL   Eosinophils Relative 3 %   Eosinophils Absolute 0.2 0.0 -  0.5 K/uL   Basophils Relative 0 %   Basophils Absolute 0.0 0.0 - 0.1 K/uL   Immature Granulocytes 0 %   Abs Immature Granulocytes 0.03 0.00 - 0.07 K/uL    Comment: Performed at [facility], [place] [address], [place], [place] [address]  Protime-INR     Status: Abnormal   Collection Time: 11/01/19  4:22 PM  Result Value Ref Range   Prothrombin Time 19.7 (H) 11.4 - 15.2 seconds   INR 1.7 (H) 0.8 - 1.2    Comment: (NOTE) INR goal varies based on device and disease states. Performed at [facility], [place] [address] [place], [place] [address]   Lactic acid, plasma     Status: None   Collection Time: 11/01/19  4:22 PM  Result Value Ref Range   Lactic Acid, Venous 1.5 0.5 - 1.9 mmol/L    Comment: Performed at [facility] [address], [place], [place] [address]  Troponin I (High Sensitivity)     Status: Abnormal   Collection Time: 11/01/19  4:22 PM  Result Value Ref Range   Troponin I (High Sensitivity) 221 (HH) <18 ng/L    Comment: CRITICAL RESULT CALLED TO, READ BACK BY AND VERIFIED WITH: Jonelle Sidle 1751 11/01/2019 WBOND (NOTE) Elevated high sensitivity troponin I (hsTnI) values and significant  changes across serial measurements may suggest ACS but many other  chronic and acute conditions are known  to elevate hsTnI results.  Refer to the Links section for chest pain algorithms and additional  guidance. Performed at [facility], [place] [address] [place], [place] [address]   Brain natriuretic peptide     Status: Abnormal   Collection Time: 11/01/19  4:23 PM  Result Value Ref Range   B Natriuretic Peptide 1,065.5 (H) 0.0 - 100.0 pg/mL    Comment: Performed at [facility] [address] [place], [place] [address]  Respiratory Panel by RT PCR (Flu A&B, Covid) - Nasopharyngeal Swab     Status: None   Collection Time: 11/01/19  4:23 PM   Specimen: Nasopharyngeal Swab  Result Value Ref Range   SARS Coronavirus 2 by RT PCR NEGATIVE NEGATIVE     Comment: (NOTE) SARS-CoV-2 target nucleic acids are NOT DETECTED. The SARS-CoV-2 RNA is generally detectable in upper respiratoy specimens during the acute phase of infection. The lowest concentration of SARS-CoV-2 viral copies this assay can detect is 131 copies/mL. A negative result does not preclude SARS-Cov-2 infection and should not be used as the sole basis for treatment or other patient management decisions. A negative result may occur with  improper specimen collection/handling, submission of specimen other than nasopharyngeal swab, presence of viral mutation(s) within the areas targeted by this assay, and inadequate number of viral copies (<131 copies/mL). A negative result must be combined with clinical observations, patient history, and epidemiological information. The expected result is Negative. Fact Sheet for Patients:  PinkCheek.be Fact Sheet for Healthcare Providers:  GravelBags.it This test is not yet ap proved or cleared by the [place] FDA and  has been authorized for detection and/or diagnosis of SARS-CoV-2 by FDA under an Emergency Use Authorization (EUA). This EUA will remain  in effect (meaning this test can be used) for the duration of the COVID-19 declaration under Section 564(b)(1) of the Act, 21 U.S.C. section 360bbb-3(b)(1), unless the authorization is terminated or revoked sooner.    Influenza A by PCR NEGATIVE NEGATIVE   Influenza B by PCR NEGATIVE NEGATIVE    Comment: (NOTE) The Xpert Xpress SARS-CoV-2/FLU/RSV assay is intended as an aid in  the diagnosis of influenza from Nasopharyngeal swab specimens and  should not be used as a sole basis for treatment. Nasal washings and  aspirates are unacceptable for Xpert Xpress SARS-CoV-2/FLU/RSV  testing. Fact Sheet for Patients: PinkCheek.be Fact Sheet for Healthcare  Providers: GravelBags.it This test is not yet approved or cleared by the [place] FDA and  has been authorized for detection and/or diagnosis of SARS-CoV-2 by  FDA under an Emergency Use Authorization (EUA). This EUA will remain  in effect (meaning this test can be used) for the duration of the  Covid-19 declaration under Section 564(b)(1) of the Act, 21  U.S.C. section 360bbb-3(b)(1), unless the authorization is  terminated or revoked. Performed at [facility], [place] [address], [place], [place] [address]   Type and screen [place]     Status: None   Collection Time: 11/01/19  4:25 PM  Result Value Ref Range   ABO/RH(D) O POS    Antibody Screen NEG    Sample Expiration      11/04/2019,2359 Performed at [facility], [place] [address], [place], [place] [address]   ABO/Rh     Status: None   Collection Time: 11/01/19  4:25 PM  Result Value Ref Range   ABO/RH(D)      O POS Performed at [facility], [address], [place], [place] [address]   Lactic acid, plasma     Status: None   Collection Time: 11/01/19  5:00 PM  Result Value Ref Range   Lactic Acid, Venous 1.6 0.5 - 1.9 mmol/L    Comment: Performed at [facility] [address], [place], [place] [address]   CT ABDOMEN PELVIS WO CONTRAST  Result Date: 11/01/2019 CLINICAL DATA:  Post ventral hernia repair 3 days ago. Abdominal distention. EXAM: CT ABDOMEN AND PELVIS WITHOUT CONTRAST TECHNIQUE: Multidetector CT imaging of the abdomen and pelvis was performed following the standard protocol without IV contrast. COMPARISON:  03/28/2012 FINDINGS: Lower chest: Small bilateral pleural effusions. Bibasilar atelectasis. Cardiomegaly. Pacer wires noted in the right heart. Hepatobiliary: High-density material seen within the gallbladder may reflect layering stones. No focal hepatic abnormality or biliary ductal dilatation. Pancreas: No focal abnormality or ductal  dilatation. Spleen: No focal abnormality.  Normal size. Adrenals/Urinary Tract: Low-density lesions in the mid and lower pole of the left kidney, likely cysts. These cannot be characterized on this noncontrast study. No hydronephrosis, renal or ureteral stone. Adrenal glands and urinary bladder unremarkable. Stomach/Bowel: Gaseous distention of portions of the colon, predominantly sigmoid colon and transverse colon, likely ileus. There is left colonic diverticulosis. No active diverticulitis. Stomach and small bowel decompressed. Vascular/Lymphatic: Heavily calcified aorta and iliac vessels. No aneurysm or adenopathy. Reproductive: Uterus and adnexa unremarkable.  No mass. Other: Moderate free fluid in the abdomen and pelvis. Extensive subcutaneous edema. Soft tissue noted in the anterior upper abdomen in the region of recent hernia repair, likely postoperative changes. No focal fluid collection to suggest abscess. Musculoskeletal: No acute bony abnormality. IMPRESSION: Gas arc a appearance with small bilateral effusions, moderate ascites, and diffuse subcutaneous edema. Gaseous distention of the colon postoperatively, likely ileus. Aortic atherosclerosis.  Cardiomegaly. Electronically Signed   By: Rolm Baptise M.D.   On: 11/01/2019 18:52   CT Head Wo Contrast  Result Date: 11/01/2019 CLINICAL DATA:  Encephalopathy EXAM: CT HEAD WITHOUT CONTRAST TECHNIQUE: Contiguous axial images were obtained from the base of the skull through the vertex without intravenous contrast. COMPARISON:  08/30/2013 FINDINGS: Brain: Image quality degraded by motion. No acute intracranial abnormality. Specifically, no hemorrhage, hydrocephalus, mass lesion, acute infarction, or significant intracranial injury. Vascular: No hyperdense vessel or unexpected calcification. Skull: No acute calvarial abnormality. Sinuses/Orbits: Visualized paranasal sinuses and mastoids clear. Orbital soft tissues unremarkable. Other: None IMPRESSION: No acute  intracranial abnormality. Electronically Signed   By: Rolm Baptise M.D.   On: 11/01/2019 18:53   DG Chest Port 1 View  Result Date: 11/01/2019 CLINICAL DATA:  Short of breath and chills and confusion. Recent hernia surgery. History of colon carcinoma treated with radiation and chemotherapy. EXAM: PORTABLE CHEST 1 VIEW COMPARISON:  05/10/2019 FINDINGS: Moderate enlargement of the cardiopericardial silhouette. No mediastinal or hilar masses. There is vascular congestion. No pulmonary edema. Lungs are otherwise clear. No convincing pleural effusion and no pneumothorax. Left anterior chest wall sequential pacemaker. Ventricular lead not included on the field of view, otherwise stable from the prior study. Skeletal structures are grossly intact. IMPRESSION: 1. Moderate cardiomegaly with vascular congestion, but no convincing pulmonary edema. No evidence of pneumonia. No acute findings. Electronically Signed   By: Lajean Manes M.D.   On: 11/01/2019 16:33    Review of Systems  Constitutional: Positive for fever. Negative for chills.  HENT: Negative.   Eyes: Negative.   Respiratory: Negative for chest tightness and shortness of breath.  Cardiovascular: Positive for leg swelling. Negative for chest pain.  Gastrointestinal: Positive for abdominal distention and abdominal pain.  Endocrine: Negative.   Genitourinary: Negative.   Musculoskeletal: Negative.   Allergic/Immunologic: Negative.   Neurological: Negative.   Hematological: Negative.   Psychiatric/Behavioral: Negative.     Blood pressure 130/77, pulse 88, temperature 98.2 F (36.8 C), temperature source Oral, resp. rate 17, SpO2 98 %. Physical Exam  Constitutional: She appears well-developed and well-nourished.  HENT:  Head: Normocephalic.  Eyes: Pupils are equal, round, and reactive to light.  Neck: No thyromegaly present.  Cardiovascular: Normal rate and regular rhythm.  Respiratory: Effort normal and breath sounds normal. No  respiratory distress. She has no wheezes.  GI: Soft. She exhibits distension. There is no abdominal tenderness. There is no rebound and no guarding.  Mild distention, incision is clean dry and intact, no appreciable tenderness  Musculoskeletal:        General: Edema present.     Cervical back: Neck supple.  Neurological:  Arouses and answers questions, somewhat sleepy  Skin: Skin is warm.  Psychiatric: She has a normal mood and affect.     Assessment/Plan Postoperative ileus with dehydration -ileus seems to be moderate, will admit for IV fluids.  CT head negative.  Has mild hyperkalemia so we will avoid potassium in fluids and continue home Lasix.  Recheck labs in a.m.  I spoke with her daughter and discussed the plan.  Zenovia Jarred, MD 11/01/2019, 7:12 PM

## 2019-11-01 NOTE — Consult Note (Signed)
[facility] Medical Consultation  ADALAY AZUCENA JHE:[id number] DOB: 1946/02/07 DOA: 11/01/2019 PCP: Biagio Borg, MD   Requesting physician: Dr. Elwin Mocha. Date of consultation: November 01, 2019. Reason for consultation: Lethargy.  Fluid overload.  Impression/Recommendations Active Problems:   Ileus ([place])    1. Acute on chronic diastolic CHF last EF measured was 55 to 60% with grade 1 diastolic dysfunction in 1856 -on exam patient has significant edema with features of anasarca.  At this time I have placed patient on Lasix 60 mg IV every 12 may need further increase in dose but will closely follow intake output and metabolic panel.  Will place patient on CPAP which patient usually does at bedtime. 2. Acute encephalopathy could be related to patient's worsening renal function with pain medication which patient states has been taking more than usual after the surgery and also has history of sleep apnea for which I am going to check an ABG.  Check ammonia levels.  CT head was unremarkable.  Patient at the time of my exam is answering questions appropriately.  I have held her clonus the pain and also decrease the dose of pain medications and decrease the frequency. 3. Acute on chronic kidney disease probably stage III could be from dehydration but at this time clinically looks fluid overloaded.  Closely follow intake output metabolic panel and UA. 4. COPD not actively wheezing continue nebulizer.  Follow ABG. 5. Proximal atrial fibrillation presently in sinus rhythm on metoprolol and apixaban. 6. History of sick sinus syndrome status post pacemaker placement. 7. Chronic anemia could be from possible chronic renal disease. 8. History of gout on allopurinol. 9. Hyperlipidemia on statins. 10. Recent hernia surgery with possible ileus has not had any vomiting so far.  Will closely monitor and per surgery further recommendations. 11. History of depression on Cymbalta. 12. Hypothyroidism  on Synthroid we will check TSH.   At this time I have ordered repeat labs placed patient on Lasix 60 mg IV every 12 chest x-ray was ordered along with ABG and placed patient on CPAP.  Other medications were reviewed and mainly and have decreased her pain medication dose and increase the intervals and held clonazepam for now.  I will followup again tomorrow. Please contact me if I can be of assistance in the meanwhile. Thank you for this consultation.  Chief Complaint: Lethargy.  HPI:  74 year old female with history of COPD, chronic diastolic CHF, sleep apnea, paroxysmal atrial fibrillation, chronic anemia, likely chronic kidney disease stage III baseline creatinine around 1.3, sick sinus syndrome status post pacemaker, hypothyroidism had ventral hernia surgery about 5 days ago was discharged the following day and became more lethargic and decreased p.o. intake and was brought to the ER today.  In the ER patient's labs show creatinine is worsened from 1.3 previously has increased to 1.8 in the span of 1 week.  Hemoglobin is around 10.6 appears to be at baseline.  CT head was unremarkable CT abdomen pelvis shows fluid overload and features for ileus.  Given the patient's fluid overload status worsening renal function and lethargy we were consulted.  On my exam patient is answering questions appropriately but does look mildly lethargic.  Moves all extremities.  She is requiring 2 L oxygen at this time.  Denies any chest pain nausea vomiting or abdominal pain or diarrhea.  Review of Systems:  As in the history of present illness rest of them negative.  Past Medical History:  Diagnosis Date  . Anemia   .  Asthma 06/13/2011   hx of  . Atrial fibrillation ([place])   . Atrial flutter ([place])   . Breast cancer ([place]) dx'd 2005   No BP check or stick in Left arm  . CAD (coronary artery disease)    a. Nonobstructive CAD 2007 (EF 75%.  RCA  proximal 75% tubular, 25% prox LAD, 25% d1, 50% D2) at [facility]. b. NSTEMI  01/2010 in setting of respiratory failure, medical approach (not a good candidate for noninvasive eval)  . Cellulitis    hx of cellulitis in left leg  . Cervical radiculopathy 06/13/2011  . CHF (congestive heart failure) ([place])   . Complication of anesthesia    per patient "hard to wake up and come out of the aneshthesia, happened years ago"  . COPD (chronic obstructive pulmonary disease) ([place])    on O2  . Depression 06/13/2011  . Diverticulosis   . Esophageal dysmotility   . Fatty liver   . Gout 06/13/2011  . History of Clostridium difficile early dec 2015   no current diarrhea  . History of home oxygen therapy    2 liters per nasal cannula all the time  . Hx of dysfunctional uterine bleeding    last bleeding jan 2016, worked up for with d and c x 2 no cause found  . Hyperlipidemia   . Hypertension   . Hypothyroidism   . Impaired glucose tolerance 11/01/2012  . Myocardial infarction ([place])   . Neuropathy 06/13/2011  . Neuropathy    per patient in hands and in feet  . Obesity hypoventilation syndrome ([place]) 06/13/2011  . OSA on CPAP   . Personal history of chemotherapy   . Personal history of radiation therapy   . PFO (patent foramen ovale)   . Presence of permanent cardiac pacemaker   . Risk for falls   . Syncope and collapse    Bradycardia with pauses. S/P pacemaker  . Tubular adenoma of colon    Past Surgical History:  Procedure Laterality Date  . APPENDECTOMY    . BREAST BIOPSY    . BREAST LUMPECTOMY Left 05/2004  . CARDIOVERSION N/A 03/15/2013   Procedure: CARDIOVERSION;  Surgeon: Thayer Headings, MD;  Location: [facility] ENDOSCOPY;  Service: Cardiovascular;  Laterality: N/A;  . CERVICAL BIOPSY  June 2013   at [facility] for Heavy  Bleeding  . [place] SURGERY  2004 or 2005   have cadaver bones,, screws, and plates c 5 to c 6  . COLONOSCOPY    . COLONOSCOPY WITH PROPOFOL N/A 10/22/2014   Procedure: COLONOSCOPY WITH PROPOFOL;  Surgeon: Jerene Bears, MD;  Location: [facility] ENDOSCOPY;   Service: Gastroenterology;  Laterality: N/A;  . DILATION AND CURETTAGE OF UTERUS    . EPIGASTRIC HERNIA REPAIR    . EPIGASTRIC HERNIA REPAIR N/A 10/29/2019   Procedure: EPIGASTRIC HERNIA REPAIR WITH MESH;  Surgeon: Coralie Keens, MD;  Location: [place];  Service: General;  Laterality: N/A;  LMA ANESTHESIA  . EYE SURGERY    . LAPAROSCOPIC APPENDECTOMY  03/29/2012   Procedure: APPENDECTOMY LAPAROSCOPIC;  Surgeon: Adin Hector, MD;  Location: [facility] ORS;  Service: General;  Laterality: N/A;  . LOOP RECORDER EXPLANT  08/31/2013   Procedure: LOOP RECORDER EXPLANT;  Surgeon: Coralyn Mark, MD;  Location[facility] CATH LAB;  Service: Cardiovascular;;  . LOOP RECORDER IMPLANT  08-28-2013; 08-31-2013   MDT LinQ implanted by Dr Rayann Heman for syncope; explanted 08-31-2013 after sinus pauses identified  . LOOP RECORDER IMPLANT N/A 08/28/2013   Procedure:  LOOP RECORDER IMPLANT;  Surgeon: Coralyn Mark, MD;  Location: [place] CATH LAB;  Service: Cardiovascular;  Laterality: N/A;  . OVARIAN CYST REMOVAL    . PACEMAKER INSERTION  08-31-2013   Biotronik Evera dual chamber pacemaker placed for neurocardiogenic syncope and sinus pauses by Dr Rayann Heman  . PERMANENT PACEMAKER INSERTION N/A 08/31/2013   Procedure: PERMANENT PACEMAKER INSERTION;  Surgeon: Coralyn Mark, MD;  Location: [place] CATH LAB;  Service: Cardiovascular;  Laterality: N/A;  . TEE WITHOUT CARDIOVERSION N/A 03/15/2013   Procedure: TRANSESOPHAGEAL ECHOCARDIOGRAM (TEE);  Surgeon: Thayer Headings, MD;  Location: [place];  Service: Cardiovascular;  Laterality: N/A;   Social History:  reports that she quit smoking about 21 years ago. Her smoking use included cigarettes. She has a 20.00 pack-year smoking history. She has never used smokeless tobacco. She reports current alcohol use. She reports that she does not use drugs.  Allergies  Allergen Reactions  . Cephalexin Other (See Comments)    Exfoliative dermatitis reaction  . Ciprofloxacin Other (See Comments)     Folliculitis   . Lisinopril Cough  . Codeine     REACTION: unknown - pt. states unaware of having reaction to codeine  . Latex Dermatitis  . Tape Dermatitis  . Cleocin [Clindamycin Hcl] Rash  . Dilaudid [Hydromorphone] Other (See Comments)    Oversedation  . Doxycycline Rash  . Penicillins Rash    Did it involve swelling of the face/tongue/throat, SOB, or low BP? No Did it involve sudden or severe rash/hives, skin peeling, or any reaction on the inside of your mouth or nose? Yes Did you need to seek medical attention at a hospital or doctor's office?Yes When did it last happen?several years ago If all above answers are "NO", may proceed with cephalosporin use.   Family History  Problem Relation Age of Onset  . Uterine cancer Mother   . Heart disease Mother   . Breast cancer Sister 77  . Ovarian cancer Sister   . Breast cancer Cousin 36  . Diabetes Brother   . Breast cancer Maternal Aunt   . Ovarian cancer Sister     Prior to Admission medications   Medication Sig Start Date End Date Taking? Authorizing Provider  albuterol (PROAIR HFA) 108 (90 Base) MCG/ACT inhaler Inhale 2 puffs into the lungs See admin instructions. Inhale 2 puffs into the lungs in the morning, 2 puffs at bedtime, and one to two times daily as needed for shortness of breath or wheezing   Yes [provider]  allopurinol (ZYLOPRIM) 300 MG tablet TAKE 1 TABLET DAILY Patient taking differently: Take 300 mg by mouth daily.  05/17/17  Yes Biagio Borg, MD  amitriptyline (ELAVIL) 100 MG tablet Take 1 tablet (100 mg total) by mouth at bedtime. 06/13/19  Yes Biagio Borg, MD  apixaban (ELIQUIS) 5 MG TABS tablet Take 1 tablet (5 mg total) by mouth 2 (two) times daily. 10/02/19  Yes Biagio Borg, MD  atorvastatin (LIPITOR) 40 MG tablet TAKE 1 TABLET DAILY Patient taking differently: Take 40 mg by mouth at bedtime.  09/26/18  Yes Lelon Perla, MD  B Complex Vitamins (VITAMIN B COMPLEX PO) Take 1 tablet  by mouth daily.   Yes [provider]  budesonide-formoterol (SYMBICORT) 160-4.5 MCG/ACT inhaler Inhale 2 puffs into the lungs 2 (two) times daily. 10/22/19  Yes Biagio Borg, MD  cholecalciferol (VITAMIN D3) 25 MCG (1000 UNIT) tablet Take 2,000 Units by mouth daily.   Yes [provider]  clonazePAM (KLONOPIN) 0.5 MG tablet Take 1 tablet (0.5 mg total) by mouth daily as needed for anxiety. 10/02/19  Yes Biagio Borg, MD  DULoxetine (CYMBALTA) 30 MG capsule Take 2 capsules (60 mg total) by mouth at bedtime. 08/13/19  Yes Biagio Borg, MD  Guaifenesin [facility] MAXIMUM STRENGTH) 1200 MG TB12 Take 1,200 mg by mouth 2 (two) times daily.   Yes [provider]  levothyroxine (SYNTHROID) 200 MCG tablet Take 1 tablet (200 mcg total) by mouth daily. 03/08/19  Yes Biagio Borg, MD  omeprazole (PRILOSEC) 40 MG capsule Take 1 capsule (40 mg total) by mouth daily. Patient taking differently: Take 40 mg by mouth daily as needed (acid reflux).  05/10/19  Yes Domenic Moras, PA-C  OXYGEN Inhale 2-3 L/min into the lungs continuous.    Yes [provider]  potassium chloride SA (KLOR-CON) 20 MEQ tablet TAKE 1 TABLET TWICE DAILY Patient taking differently: Take 20 mEq by mouth 2 (two) times daily.  10/09/19  Yes Biagio Borg, MD  traMADol (ULTRAM) 50 MG tablet Take 1 tablet (50 mg total) by mouth daily as needed for moderate pain. Patient taking differently: Take 50 mg by mouth every 6 (six) hours as needed for moderate pain.  10/02/19  Yes Biagio Borg, MD  vitamin C (ASCORBIC ACID) 500 MG tablet Take 500 mg by mouth daily.    Yes [provider]  VITAMIN E PO Take 1,000 Units by mouth daily.    Yes [provider]  AZO-CRANBERRY PO Take 1 tablet by mouth daily as needed (urinary symptoms).     [provider]  ferrous sulfate 325 (65 FE) MG tablet Take 325 mg by mouth at bedtime.    [provider]  furosemide (LASIX) 40 MG tablet Take 40 mg (1  tab) by mouth in the morning. Take 20 mg (1/2 tab) in the evening. Patient taking differently: Take 60 mg by mouth 2 (two) times daily.  08/13/19   Biagio Borg, MD  metoprolol tartrate (LOPRESSOR) 25 MG tablet Take 0.5 tablets (12.5 mg total) by mouth as needed (for heart rate above 120 at rest). Patient taking differently: Take 25 mg by mouth 2 (two) times daily as needed (for heart rate above 120 at rest).  09/28/19   Lelon Perla, MD   Physical Exam: Blood pressure (!) 104/56, pulse 86, temperature 97.8 F (36.6 C), temperature source Oral, resp. rate 18, SpO2 96 %. Vitals:   11/01/19 1917 11/01/19 2049  BP: 109/61 (!) 104/56  Pulse: 87 86  Resp: 19 18  Temp:  97.8 F (36.6 C)  SpO2: 98% 96%     General: Moderately built and nourished.   Eyes: Anicteric no pallor.  ENT: No discharge from the ears eyes nose or mouth.  Neck: No mass felt.  No JVD appreciated.  Cardiovascular: S1-S2 heard.  Respiratory: No rhonchi or crepitations.  Abdomen: Soft nontender bowel sound present.  Surgical sutures site seen.  Skin: Surgical sutures seen in the abdomen epigastric area.  Musculoskeletal: 3+ edema bilateral lower extremity.  Psychiatric: Patient is well oriented to her name and place.  Neurologic: Patient is very lethargic but answers questions appropriately oriented to name and place moves all extremities.  Labs on Admission:  Basic Metabolic Panel: Recent Labs  Lab 11/01/19 1622  NA 141  K 5.6*  CL 99  CO2 32  GLUCOSE 89  BUN 26*  CREATININE 1.89*  CALCIUM 9.1   Liver  Function Tests: Recent Labs  Lab 11/01/19 1622  AST 31  ALT 20  ALKPHOS 144*  BILITOT 1.5*  PROT 6.6  ALBUMIN 3.3*   No results for input(s): LIPASE, AMYLASE in the last 168 hours. No results for input(s): AMMONIA in the last 168 hours. CBC: Recent Labs  Lab 10/30/19 0750 11/01/19 1622  WBC 9.3 7.4  NEUTROABS  --  5.9  HGB 10.5* 10.8*  HCT 37.4 38.1  MCV 104.5* 104.4*  PLT  136* 188   Cardiac Enzymes: No results for input(s): CKTOTAL, CKMB, CKMBINDEX, TROPONINI in the last 168 hours. BNP: Invalid input(s): POCBNP CBG: Recent Labs  Lab 11/01/19 1553  GLUCAP 79    Radiological Exams on Admission: CT ABDOMEN PELVIS WO CONTRAST  Result Date: 11/01/2019 CLINICAL DATA:  Post ventral hernia repair 3 days ago. Abdominal distention. EXAM: CT ABDOMEN AND PELVIS WITHOUT CONTRAST TECHNIQUE: Multidetector CT imaging of the abdomen and pelvis was performed following the standard protocol without IV contrast. COMPARISON:  03/28/2012 FINDINGS: Lower chest: Small bilateral pleural effusions. Bibasilar atelectasis. Cardiomegaly. Pacer wires noted in the right heart. Hepatobiliary: High-density material seen within the gallbladder may reflect layering stones. No focal hepatic abnormality or biliary ductal dilatation. Pancreas: No focal abnormality or ductal dilatation. Spleen: No focal abnormality.  Normal size. Adrenals/Urinary Tract: Low-density lesions in the mid and lower pole of the left kidney, likely cysts. These cannot be characterized on this noncontrast study. No hydronephrosis, renal or ureteral stone. Adrenal glands and urinary bladder unremarkable. Stomach/Bowel: Gaseous distention of portions of the colon, predominantly sigmoid colon and transverse colon, likely ileus. There is left colonic diverticulosis. No active diverticulitis. Stomach and small bowel decompressed. Vascular/Lymphatic: Heavily calcified aorta and iliac vessels. No aneurysm or adenopathy. Reproductive: Uterus and adnexa unremarkable.  No mass. Other: Moderate free fluid in the abdomen and pelvis. Extensive subcutaneous edema. Soft tissue noted in the anterior upper abdomen in the region of recent hernia repair, likely postoperative changes. No focal fluid collection to suggest abscess. Musculoskeletal: No acute bony abnormality. IMPRESSION: Gas arc a appearance with small bilateral effusions, moderate  ascites, and diffuse subcutaneous edema. Gaseous distention of the colon postoperatively, likely ileus. Aortic atherosclerosis.  Cardiomegaly. Electronically Signed   By: Rolm Baptise M.D.   On: 11/01/2019 18:52   CT Head Wo Contrast  Result Date: 11/01/2019 CLINICAL DATA:  Encephalopathy EXAM: CT HEAD WITHOUT CONTRAST TECHNIQUE: Contiguous axial images were obtained from the base of the skull through the vertex without intravenous contrast. COMPARISON:  08/30/2013 FINDINGS: Brain: Image quality degraded by motion. No acute intracranial abnormality. Specifically, no hemorrhage, hydrocephalus, mass lesion, acute infarction, or significant intracranial injury. Vascular: No hyperdense vessel or unexpected calcification. Skull: No acute calvarial abnormality. Sinuses/Orbits: Visualized paranasal sinuses and mastoids clear. Orbital soft tissues unremarkable. Other: None IMPRESSION: No acute intracranial abnormality. Electronically Signed   By: Rolm Baptise M.D.   On: 11/01/2019 18:53   DG Chest Port 1 View  Result Date: 11/01/2019 CLINICAL DATA:  Short of breath and chills and confusion. Recent hernia surgery. History of colon carcinoma treated with radiation and chemotherapy. EXAM: PORTABLE CHEST 1 VIEW COMPARISON:  05/10/2019 FINDINGS: Moderate enlargement of the cardiopericardial silhouette. No mediastinal or hilar masses. There is vascular congestion. No pulmonary edema. Lungs are otherwise clear. No convincing pleural effusion and no pneumothorax. Left anterior chest wall sequential pacemaker. Ventricular lead not included on the field of view, otherwise stable from the prior study. Skeletal structures are grossly intact. IMPRESSION: 1. Moderate cardiomegaly with vascular  congestion, but no convincing pulmonary edema. No evidence of pneumonia. No acute findings. Electronically Signed   By: Lajean Manes M.D.   On: 11/01/2019 16:33    EKG: Independently reviewed.  Normal sinus rhythm.  Time spent: 1  hour.  Rise Patience [facility]  If 7PM-7AM, please contact night-coverage [email or web address] Password [facility] 11/01/2019, 10:55 PM

## 2019-11-01 NOTE — ED Provider Notes (Signed)
[facility] EMERGENCY DEPARTMENT Provider Note   CSN: [id number] Arrival date & time: 11/01/19  1531     History No chief complaint on file.   Tabathia JENNFER GASSEN is a 74 y.o. female.  74 year old female with prior medical history as detailed below presents for evaluation of decreased mental status and apparent worsening hypoxia.  Patient is status post recent epigastric hernia repair with mesh.  Surgery occurred on February 8.  Patient was discharged on the ninth.  Patient surgeon was Dr. Ninfa Linden.  Patient with decreased p.o. intake and increased confusion over the last 24 hours per EMS.  Patient also with worsening hypoxia.  Patient is on baseline 2 L nasal cannula at home.  EMS required 5 L nasal cannula O2 for transport.  On my initial eval the patient is without significant complaint.  Additional history is difficult to obtain from the patient.  Level 5 caveat secondary to same.  Family contact attempted for additional information.  Additional history from patient's daughter.  Patient reports that since discharge the patient has not yet had a BM.  She also reports fever up to 101 overnight.  Patient with significantly less p.o. intake over the last 12 hours.  Patient is also confused from her baseline.  The history is provided by the patient, medical records and the EMS personnel.  Illness Location:  AMS, hypoxia, decreased p.o. intake Severity:  Mild Onset quality:  Gradual Duration:  1 day Timing:  Constant Progression:  Worsening Chronicity:  New Associated symptoms: no fever and no shortness of breath        Past Medical History:  Diagnosis Date  . Anemia   . Asthma 06/13/2011   hx of  . Atrial fibrillation ([place])   . Atrial flutter ([place])   . Breast cancer ([place]) dx'd 2005   No BP check or stick in Left arm  . CAD (coronary artery disease)    a. Nonobstructive CAD 2007 (EF 75%.  RCA  proximal 75% tubular, 25% prox LAD, 25% d1, 50% D2) at [facility]. b.  NSTEMI 01/2010 in setting of respiratory failure, medical approach (not a good candidate for noninvasive eval)  . Cellulitis    hx of cellulitis in left leg  . Cervical radiculopathy 06/13/2011  . CHF (congestive heart failure) ([place])   . Complication of anesthesia    per patient "hard to wake up and come out of the aneshthesia, happened years ago"  . COPD (chronic obstructive pulmonary disease) ([place])    on O2  . Depression 06/13/2011  . Diverticulosis   . Esophageal dysmotility   . Fatty liver   . Gout 06/13/2011  . History of Clostridium difficile early dec 2015   no current diarrhea  . History of home oxygen therapy    2 liters per nasal cannula all the time  . Hx of dysfunctional uterine bleeding    last bleeding jan 2016, worked up for with d and c x 2 no cause found  . Hyperlipidemia   . Hypertension   . Hypothyroidism   . Impaired glucose tolerance 11/01/2012  . Myocardial infarction ([place])   . Neuropathy 06/13/2011  . Neuropathy    per patient in hands and in feet  . Obesity hypoventilation syndrome ([place]) 06/13/2011  . OSA on CPAP   . Personal history of chemotherapy   . Personal history of radiation therapy   . PFO (patent foramen ovale)   . Presence of permanent cardiac pacemaker   . Risk for  falls   . Syncope and collapse    Bradycardia with pauses. S/P pacemaker  . Tubular adenoma of colon     Patient Active Problem List   Diagnosis Date Noted  . Epigastric hernia 10/29/2019  . AKI (acute kidney injury) ([place]) 05/04/2019  . Hypotension 05/03/2019  . Anxiety 03/22/2018  . COPD with acute exacerbation ([place]) 12/28/2017  . Chronic respiratory failure with hypoxia and hypercapnia ([place]) 11/04/2016  . Tinea 09/26/2016  . Dyspnea on exertion 09/21/2016  . Morbid (severe) obesity due to excess calories ([place]) 09/21/2016  . History of left breast cancer 06/08/2016  . Panic attacks 04/27/2016  . Chest pain 04/11/2016  . OSA (obstructive sleep apnea) 02/12/2016  . Adhesive  capsulitis of right shoulder 08/21/2015  . Right shoulder pain 07/16/2015  . Osteoarthritis, hand 07/16/2015  . Hair loss 07/16/2015  . Blood in mouth of unknown source 05/06/2015  . Dysphagia, pharyngoesophageal phase 05/06/2015  . Chronic respiratory failure ([place]) 10/28/2014  . Cellulitis of female breast 10/25/2014  . Rash and nonspecific skin eruption 10/25/2014  . Hx of colonic polyps   . Benign neoplasm of cecum   . Benign neoplasm of transverse colon   . CHF (congestive heart failure) ([place])   . History of Clostridium difficile 06/27/2014  . RUQ pain 05/31/2014  . Lump in the abdomen 05/31/2014  . Diarrhea 05/14/2014  . Abdominal pain, other specified site 05/14/2014  . Pacemaker 12/03/2013  . Exfoliative dermatitis 11/27/2013  . Urinary urgency 11/13/2013  . Post-menopausal bleeding 11/13/2013  . Syncope and collapse 08/30/2013  . Atrial fibrillation ([place])   . Syncope 08/26/2013  . Postmenopausal vaginal bleeding 05/02/2013  . Tachy-brady syndrome ([place]) 03/15/2013  . Acute on chronic diastolic CHF (congestive heart failure) ([place]) 03/15/2013  . PFO (patent foramen ovale) 03/15/2013  . Hypokalemia 03/15/2013  . Atrial flutter ([place]) 03/01/2013  . Tachycardia 03/01/2013  . Anticoagulant long-term use 03/01/2013  . Impaired glucose tolerance 11/01/2012  . Abnormal liver function tests 11/01/2012  . Right lumbar radiculopathy 04/28/2012  . Cough 04/15/2012  . Abnormal TSH 04/15/2012  . Acute appendicitis 03/29/2012  . Sick sinus syndrome ([place]) 03/08/2012  . Morbid obesity with BMI of 50.0-59.9, adult ([place]) 03/08/2012  . Breast cancer ([place]) 06/13/2011  . Asthma 06/13/2011  . Depression 06/13/2011  . Colon polyps 06/13/2011  . Neuropathy 06/13/2011  . Cervical radiculopathy 06/13/2011  . Gout 06/13/2011  . Obesity hypoventilation syndrome ([place]) 06/13/2011  . Cellulitis of leg, right 06/13/2011  . Encounter for well adult exam with abnormal findings 06/04/2011  .  OXYGEN-USE OF SUPPLEMENTAL 04/07/2010  . ACUT MI SUBENDOCARDIAL INFARCT SUBSQT EPIS CARE 03/13/2010  . COPD GOLD III 03/10/2010  . Sleep apnea 03/10/2010  . Hypothyroidism, acquired 03/17/2009  . Hyperlipidemia 03/17/2009  . Essential hypertension 03/17/2009  . Coronary atherosclerosis 03/17/2009    Past Surgical History:  Procedure Laterality Date  . APPENDECTOMY    . BREAST BIOPSY    . BREAST LUMPECTOMY Left 05/2004  . CARDIOVERSION N/A 03/15/2013   Procedure: CARDIOVERSION;  Surgeon: Thayer Headings, MD;  Location[facility] ENDOSCOPY;  Service: Cardiovascular;  Laterality: N/A;  . CERVICAL BIOPSY  June 2013   at [facility] for Heavy  Bleeding  . [place] SURGERY  2004 or 2005   have cadaver bones,, screws, and plates c 5 to c 6  . COLONOSCOPY    . COLONOSCOPY WITH PROPOFOL N/A 10/22/2014   Procedure: COLONOSCOPY WITH PROPOFOL;  Surgeon: Jerene Bears, MD;  Location: [facility]  ENDOSCOPY;  Service: Gastroenterology;  Laterality: N/A;  . DILATION AND CURETTAGE OF UTERUS    . EPIGASTRIC HERNIA REPAIR    . EPIGASTRIC HERNIA REPAIR N/A 10/29/2019   Procedure: EPIGASTRIC HERNIA REPAIR WITH MESH;  Surgeon: Coralie Keens, MD;  Location: [facility];  Service: General;  Laterality: N/A;  LMA ANESTHESIA  . EYE SURGERY    . LAPAROSCOPIC APPENDECTOMY  03/29/2012   Procedure: APPENDECTOMY LAPAROSCOPIC;  Surgeon: Adin Hector, MD;  Location: [facility] ORS;  Service: General;  Laterality: N/A;  . LOOP RECORDER EXPLANT  08/31/2013   Procedure: LOOP RECORDER EXPLANT;  Surgeon: Coralyn Mark, MD;  Location: [facility] CATH LAB;  Service: Cardiovascular;;  . LOOP RECORDER IMPLANT  08-28-2013; 08-31-2013   MDT LinQ implanted by Dr Rayann Heman for syncope; explanted 08-31-2013 after sinus pauses identified  . LOOP RECORDER IMPLANT N/A 08/28/2013   Procedure: LOOP RECORDER IMPLANT;  Surgeon: Coralyn Mark, MD;  Location: [facility] CATH LAB;  Service: Cardiovascular;  Laterality: N/A;  . OVARIAN CYST REMOVAL    . PACEMAKER INSERTION  08-31-2013     Biotronik Evera dual chamber pacemaker placed for neurocardiogenic syncope and sinus pauses by Dr Rayann Heman  . PERMANENT PACEMAKER INSERTION N/A 08/31/2013   Procedure: PERMANENT PACEMAKER INSERTION;  Surgeon: Coralyn Mark, MD;  Location: [facility] CATH LAB;  Service: Cardiovascular;  Laterality: N/A;  . TEE WITHOUT CARDIOVERSION N/A 03/15/2013   Procedure: TRANSESOPHAGEAL ECHOCARDIOGRAM (TEE);  Surgeon: Thayer Headings, MD;  Location: [facility] ENDOSCOPY;  Service: Cardiovascular;  Laterality: N/A;     OB History   No obstetric history on file.     Family History  Problem Relation Age of Onset  . Uterine cancer Mother   . Heart disease Mother   . Breast cancer Sister 9  . Ovarian cancer Sister   . Breast cancer Cousin 38  . Diabetes Brother   . Breast cancer Maternal Aunt   . Ovarian cancer Sister     Social History   Tobacco Use  . Smoking status: Former Smoker    Packs/day: 1.00    Years: 20.00    Pack years: 20.00    Types: Cigarettes    Quit date: 09/20/1998    Years since quitting: 21.1  . Smokeless tobacco: Never Used  . Tobacco comment: 1ppd x 20 years  Substance Use Topics  . Alcohol use: Yes    Comment: rare, on holidays, and on special occasions per patient on 10/25/2019  . Drug use: No    Home Medications Prior to Admission medications   Medication Sig Start Date End Date Taking? Authorizing Provider  allopurinol (ZYLOPRIM) 300 MG tablet TAKE 1 TABLET DAILY Patient taking differently: Take 300 mg by mouth daily.  05/17/17   Biagio Borg, MD  amitriptyline (ELAVIL) 100 MG tablet Take 1 tablet (100 mg total) by mouth at bedtime. 06/13/19   Biagio Borg, MD  apixaban (ELIQUIS) 5 MG TABS tablet Take 1 tablet (5 mg total) by mouth 2 (two) times daily. 10/02/19   Biagio Borg, MD  atorvastatin (LIPITOR) 40 MG tablet TAKE 1 TABLET DAILY Patient taking differently: Take 40 mg by mouth at bedtime.  09/26/18   Lelon Perla, MD  AZO-CRANBERRY PO Take 1 tablet by mouth daily as  needed (urinary symptoms).     [provider]  B Complex Vitamins (VITAMIN B COMPLEX PO) Take 1 tablet by mouth daily.    [provider]  budesonide-formoterol (SYMBICORT) 160-4.5 MCG/ACT inhaler Inhale 2 puffs  into the lungs 2 (two) times daily. 10/22/19   Biagio Borg, MD  cholecalciferol (VITAMIN D3) 25 MCG (1000 UNIT) tablet Take 2,000 Units by mouth daily.    [provider]  clonazePAM (KLONOPIN) 0.5 MG tablet Take 1 tablet (0.5 mg total) by mouth daily as needed for anxiety. 10/02/19   Biagio Borg, MD  DULoxetine (CYMBALTA) 30 MG capsule Take 2 capsules (60 mg total) by mouth at bedtime. 08/13/19   Biagio Borg, MD  ferrous sulfate 325 (65 FE) MG tablet Take 325 mg by mouth at bedtime.    [provider]  furosemide (LASIX) 40 MG tablet Take 40 mg (1 tab) by mouth in the morning. Take 20 mg (1/2 tab) in the evening. Patient taking differently: Take 60 mg by mouth 2 (two) times daily.  08/13/19   Biagio Borg, MD  Guaifenesin [facility] MAXIMUM STRENGTH) 1200 MG TB12 Take 1,200 mg by mouth 2 (two) times daily.    [provider]  levothyroxine (SYNTHROID) 200 MCG tablet Take 1 tablet (200 mcg total) by mouth daily. 03/08/19   Biagio Borg, MD  metoprolol tartrate (LOPRESSOR) 25 MG tablet Take 0.5 tablets (12.5 mg total) by mouth as needed (for heart rate above 120 at rest). Patient taking differently: Take 25 mg by mouth 2 (two) times daily as needed (for heart rate above 120 at rest).  09/28/19   Lelon Perla, MD  omeprazole (PRILOSEC) 40 MG capsule Take 1 capsule (40 mg total) by mouth daily. Patient taking differently: Take 40 mg by mouth daily as needed (acid reflux).  05/10/19   Domenic Moras, PA-C  OXYGEN 2lpm with CPAP at night and with exertion    [provider]  potassium chloride SA (KLOR-CON) 20 MEQ tablet TAKE 1 TABLET TWICE DAILY Patient taking differently: Take 20 mEq by mouth 2 (two) times daily.  10/09/19   Biagio Borg,  MD  traMADol (ULTRAM) 50 MG tablet Take 1 tablet (50 mg total) by mouth daily as needed for moderate pain. 10/02/19   Biagio Borg, MD  traMADol (ULTRAM) 50 MG tablet Take 1 tablet (50 mg total) by mouth every 6 (six) hours as needed for moderate pain. 10/29/19   Coralie Keens, MD  vitamin C (ASCORBIC ACID) 500 MG tablet Take 500 mg by mouth daily.     [provider]  VITAMIN E PO Take 450 Units by mouth daily.    [provider]    Allergies    Cephalexin, Ciprofloxacin, Lisinopril, Codeine, Cleocin [clindamycin hcl], Dilaudid [hydromorphone], Doxycycline, and Penicillins  Review of Systems   Review of Systems  Constitutional: Negative for fever.  Respiratory: Negative for shortness of breath.   All other systems reviewed and are negative.   Physical Exam Updated Vital Signs BP 129/77 (BP Location: Right Arm)   Pulse 89   Temp 98.2 F (36.8 C) (Oral)   Resp 17   SpO2 98%   Physical Exam Vitals and nursing note reviewed.  Constitutional:      General: She is not in acute distress.    Appearance: She is well-developed.  HENT:     Head: Normocephalic and atraumatic.     Mouth/Throat:     Mouth: Mucous membranes are dry.  Eyes:     Conjunctiva/sclera: Conjunctivae normal.     Pupils: Pupils are equal, round, and reactive to light.  Cardiovascular:     Rate and Rhythm: Normal rate and regular rhythm.  Heart sounds: Normal heart sounds.  Pulmonary:     Effort: Pulmonary effort is normal. No respiratory distress.     Breath sounds: Normal breath sounds.  Abdominal:     General: There is no distension.     Palpations: Abdomen is soft.     Tenderness: There is no abdominal tenderness.  Musculoskeletal:        General: No deformity. Normal range of motion.     Cervical back: Normal range of motion and neck supple.     Right lower leg: Edema present.     Left lower leg: Edema present.     Comments: 2-3 plus BLE edema   Skin:    General: Skin is  warm and dry.  Neurological:     General: No focal deficit present.     Mental Status: She is alert.     ED Results / Procedures / Treatments   Labs (all labs ordered are listed, but only abnormal results are displayed) Labs Reviewed  COMPREHENSIVE METABOLIC PANEL - Abnormal; Notable for the following components:      Result Value   Potassium 5.6 (*)    BUN 26 (*)    Creatinine, Ser 1.89 (*)    Albumin 3.3 (*)    Alkaline Phosphatase 144 (*)    Total Bilirubin 1.5 (*)    GFR calc non Af Amer 26 (*)    GFR calc Af Amer 30 (*)    All other components within normal limits  CBC WITH DIFFERENTIAL/PLATELET - Abnormal; Notable for the following components:   RBC 3.65 (*)    Hemoglobin 10.8 (*)    MCV 104.4 (*)    MCHC 28.3 (*)    RDW 17.8 (*)    nRBC 0.4 (*)    Lymphs Abs 0.6 (*)    All other components within normal limits  PROTIME-INR - Abnormal; Notable for the following components:   Prothrombin Time 19.7 (*)    INR 1.7 (*)    All other components within normal limits  BRAIN NATRIURETIC PEPTIDE - Abnormal; Notable for the following components:   B Natriuretic Peptide 1,065.5 (*)    All other components within normal limits  TROPONIN I (HIGH SENSITIVITY) - Abnormal; Notable for the following components:   Troponin I (High Sensitivity) 221 (*)    All other components within normal limits  RESPIRATORY PANEL BY RT PCR (FLU A&B, COVID)  CULTURE, BLOOD (ROUTINE X 2)  CULTURE, BLOOD (ROUTINE X 2)  LACTIC ACID, PLASMA  LACTIC ACID, PLASMA  URINALYSIS, ROUTINE W REFLEX MICROSCOPIC  CBG MONITORING, ED  TYPE AND SCREEN  ABO/RH  TROPONIN I (HIGH SENSITIVITY)    EKG EKG Interpretation  Date/Time:  Thursday November 01 2019 15:44:53 EST Ventricular Rate:  90 PR Interval:    QRS Duration: 100 QT Interval:  363 QTC Calculation: 445 R Axis:   68 Text Interpretation: Sinus rhythm Low voltage, precordial leads Borderline T wave abnormalities Confirmed by Dene Gentry  [phone]) on 11/01/2019 3:47:49 PM   Radiology CT ABDOMEN PELVIS WO CONTRAST  Result Date: 11/01/2019 CLINICAL DATA:  Post ventral hernia repair 3 days ago. Abdominal distention. EXAM: CT ABDOMEN AND PELVIS WITHOUT CONTRAST TECHNIQUE: Multidetector CT imaging of the abdomen and pelvis was performed following the standard protocol without IV contrast. COMPARISON:  03/28/2012 FINDINGS: Lower chest: Small bilateral pleural effusions. Bibasilar atelectasis. Cardiomegaly. Pacer wires noted in the right heart. Hepatobiliary: High-density material seen within the gallbladder may reflect layering stones. No focal hepatic abnormality or  biliary ductal dilatation. Pancreas: No focal abnormality or ductal dilatation. Spleen: No focal abnormality.  Normal size. Adrenals/Urinary Tract: Low-density lesions in the mid and lower pole of the left kidney, likely cysts. These cannot be characterized on this noncontrast study. No hydronephrosis, renal or ureteral stone. Adrenal glands and urinary bladder unremarkable. Stomach/Bowel: Gaseous distention of portions of the colon, predominantly sigmoid colon and transverse colon, likely ileus. There is left colonic diverticulosis. No active diverticulitis. Stomach and small bowel decompressed. Vascular/Lymphatic: Heavily calcified aorta and iliac vessels. No aneurysm or adenopathy. Reproductive: Uterus and adnexa unremarkable.  No mass. Other: Moderate free fluid in the abdomen and pelvis. Extensive subcutaneous edema. Soft tissue noted in the anterior upper abdomen in the region of recent hernia repair, likely postoperative changes. No focal fluid collection to suggest abscess. Musculoskeletal: No acute bony abnormality. IMPRESSION: Gas arc a appearance with small bilateral effusions, moderate ascites, and diffuse subcutaneous edema. Gaseous distention of the colon postoperatively, likely ileus. Aortic atherosclerosis.  Cardiomegaly. Electronically Signed   By: Rolm Baptise M.D.   On:  11/01/2019 18:52   CT Head Wo Contrast  Result Date: 11/01/2019 CLINICAL DATA:  Encephalopathy EXAM: CT HEAD WITHOUT CONTRAST TECHNIQUE: Contiguous axial images were obtained from the base of the skull through the vertex without intravenous contrast. COMPARISON:  08/30/2013 FINDINGS: Brain: Image quality degraded by motion. No acute intracranial abnormality. Specifically, no hemorrhage, hydrocephalus, mass lesion, acute infarction, or significant intracranial injury. Vascular: No hyperdense vessel or unexpected calcification. Skull: No acute calvarial abnormality. Sinuses/Orbits: Visualized paranasal sinuses and mastoids clear. Orbital soft tissues unremarkable. Other: None IMPRESSION: No acute intracranial abnormality. Electronically Signed   By: Rolm Baptise M.D.   On: 11/01/2019 18:53   DG Chest Port 1 View  Result Date: 11/01/2019 CLINICAL DATA:  Short of breath and chills and confusion. Recent hernia surgery. History of colon carcinoma treated with radiation and chemotherapy. EXAM: PORTABLE CHEST 1 VIEW COMPARISON:  05/10/2019 FINDINGS: Moderate enlargement of the cardiopericardial silhouette. No mediastinal or hilar masses. There is vascular congestion. No pulmonary edema. Lungs are otherwise clear. No convincing pleural effusion and no pneumothorax. Left anterior chest wall sequential pacemaker. Ventricular lead not included on the field of view, otherwise stable from the prior study. Skeletal structures are grossly intact. IMPRESSION: 1. Moderate cardiomegaly with vascular congestion, but no convincing pulmonary edema. No evidence of pneumonia. No acute findings. Electronically Signed   By: Lajean Manes M.D.   On: 11/01/2019 16:33    Procedures Procedures (including critical care time)  Medications Ordered in ED Medications - No data to display  ED Course  I have reviewed the triage vital signs and the nursing notes.  Pertinent labs & imaging results that were available during my care of  the patient were reviewed by me and considered in my medical decision making (see chart for details).    MDM Rules/Calculators/A&P                      MDM  Screen complete  Chelse P Loving was evaluated in Emergency Department on 11/01/2019 for the symptoms described in the history of present illness. She was evaluated in the context of the global COVID-19 pandemic, which necessitated consideration that the patient might be at risk for infection with the SARS-CoV-2 virus that causes COVID-19. Institutional protocols and algorithms that pertain to the evaluation of patients at risk for COVID-19 are in a state of rapid change based on information released by regulatory bodies including the  CDC and federal and [employer]. These policies and algorithms were followed during the patient's care in the ED.  Patient is presenting for evaluation of decreased mental status, decreased p.o. intake, and suspected dehydration in the setting of recent abdominal hernia repair.  Eval and labs suggest dehydration.  Discussed with surgical service [facility]) who will admit the patient for further work-up and treatment.   Final Clinical Impression(s) / ED Diagnoses Final diagnoses:  Altered mental status, unspecified altered mental status type  Dehydration    Rx / DC Orders ED Discharge Orders    None       Valarie Merino, MD 11/01/19 1943

## 2019-11-01 NOTE — ED Triage Notes (Signed)
PT is here by [facility] reporting AMS Recent d/c from [facility] 2lit Hand at baseline EMS reports patient required 5lit  to transport

## 2019-11-02 ENCOUNTER — Encounter (HOSPITAL_COMMUNITY): Payer: Self-pay | Admitting: General Surgery

## 2019-11-02 ENCOUNTER — Telehealth: Payer: Self-pay | Admitting: Cardiology

## 2019-11-02 ENCOUNTER — Other Ambulatory Visit: Payer: Self-pay

## 2019-11-02 DIAGNOSIS — I5033 Acute on chronic diastolic (congestive) heart failure: Secondary | ICD-10-CM

## 2019-11-02 DIAGNOSIS — G9340 Encephalopathy, unspecified: Secondary | ICD-10-CM | POA: Diagnosis present

## 2019-11-02 LAB — BLOOD GAS, ARTERIAL
Acid-Base Excess: 6.8 mmol/L — ABNORMAL HIGH (ref 0.0–2.0)
Acid-Base Excess: 7.4 mmol/L — ABNORMAL HIGH (ref 0.0–2.0)
Acid-Base Excess: 8.9 mmol/L — ABNORMAL HIGH (ref 0.0–2.0)
Bicarbonate: 33.3 mmol/L — ABNORMAL HIGH (ref 20.0–28.0)
Bicarbonate: 34 mmol/L — ABNORMAL HIGH (ref 20.0–28.0)
Bicarbonate: 34.3 mmol/L — ABNORMAL HIGH (ref 20.0–28.0)
FIO2: 30
FIO2: 30
FIO2: 32
O2 Saturation: 92.2 %
O2 Saturation: 94.1 %
O2 Saturation: 97.4 %
Patient temperature: 36.5
Patient temperature: 36.5
Patient temperature: 37
pCO2 arterial: 60.4 mmHg — ABNORMAL HIGH (ref 32.0–48.0)
pCO2 arterial: 71 mmHg (ref 32.0–48.0)
pCO2 arterial: 73.2 mmHg (ref 32.0–48.0)
pH, Arterial: 7.286 — ABNORMAL LOW (ref 7.350–7.450)
pH, Arterial: 7.289 — ABNORMAL LOW (ref 7.350–7.450)
pH, Arterial: 7.372 (ref 7.350–7.450)
pO2, Arterial: 103 mmHg (ref 83.0–108.0)
pO2, Arterial: 70.1 mmHg — ABNORMAL LOW (ref 83.0–108.0)
pO2, Arterial: 75 mmHg — ABNORMAL LOW (ref 83.0–108.0)

## 2019-11-02 LAB — BASIC METABOLIC PANEL
Anion gap: 10 (ref 5–15)
Anion gap: 12 (ref 5–15)
BUN: 24 mg/dL — ABNORMAL HIGH (ref 8–23)
BUN: 26 mg/dL — ABNORMAL HIGH (ref 8–23)
CO2: 29 mmol/L (ref 22–32)
CO2: 30 mmol/L (ref 22–32)
Calcium: 9 mg/dL (ref 8.9–10.3)
Calcium: 9.2 mg/dL (ref 8.9–10.3)
Chloride: 103 mmol/L (ref 98–111)
Chloride: 102 mmol/L (ref 98–111)
Creatinine, Ser: 1.74 mg/dL — ABNORMAL HIGH (ref 0.44–1.00)
Creatinine, Ser: 1.7 mg/dL — ABNORMAL HIGH (ref 0.44–1.00)
GFR calc Af Amer: 33 mL/min — ABNORMAL LOW (ref >60)
GFR calc Af Amer: 34 mL/min — ABNORMAL LOW (ref >60)
GFR calc non Af Amer: 29 mL/min — ABNORMAL LOW (ref >60)
GFR calc non Af Amer: 29 mL/min — ABNORMAL LOW (ref >60)
Glucose, Bld: 82 mg/dL (ref 70–99)
Glucose, Bld: 78 mg/dL (ref 70–99)
Potassium: 4.8 mmol/L (ref 3.5–5.1)
Potassium: 4.3 mmol/L (ref 3.5–5.1)
Sodium: 142 mmol/L (ref 135–145)
Sodium: 144 mmol/L (ref 135–145)

## 2019-11-02 LAB — AMMONIA: Ammonia: 38 umol/L — ABNORMAL HIGH (ref 9–35)

## 2019-11-02 LAB — TROPONIN I (HIGH SENSITIVITY): Troponin I (High Sensitivity): 170 ng/L (ref <18)

## 2019-11-02 LAB — GLUCOSE, CAPILLARY: Glucose-Capillary: 76 mg/dL (ref 70–99)

## 2019-11-02 MED ORDER — LORAZEPAM 0.5 MG PO TABS
0.5000 mg | ORAL_TABLET | Freq: Four times a day (QID) | ORAL | Status: DC | PRN
  Administered 2019-11-04: 21:00:00 0.5 mg via ORAL
  Filled 2019-11-02: qty 1

## 2019-11-02 MED ORDER — HALOPERIDOL LACTATE 5 MG/ML IJ SOLN: 1.0000 mg | Freq: Four times a day (QID) | INTRAMUSCULAR | Status: DC | PRN

## 2019-11-02 MED ORDER — APIXABAN 5 MG PO TABS
5.0000 mg | ORAL_TABLET | Freq: Two times a day (BID) | ORAL | Status: DC
  Administered 2019-11-02 – 2019-11-06 (×9): 5 mg via ORAL
  Filled 2019-11-02 (×9): qty 1

## 2019-11-02 MED ORDER — FUROSEMIDE 10 MG/ML IJ SOLN
80.0000 mg | Freq: Two times a day (BID) | INTRAMUSCULAR | Status: DC
  Administered 2019-11-02 – 2019-11-03 (×3): 80 mg via INTRAVENOUS
  Filled 2019-11-02 (×3): qty 8

## 2019-11-02 NOTE — Progress Notes (Signed)
Report given to nurse Yoko on 6E. Pt being transferred to from 5N07 to 6E02. Respiratory therapist, Theadora Rama, put pt on BIPAP before transfer. Pt transferred to [place], charge nurse and respiratory therapist also accompanied. Pt not in distress and tolerated well.

## 2019-11-02 NOTE — Progress Notes (Signed)
Subjective/Chief Complaint: Currently awake now and following commands   Objective: Vital signs in last 24 hours: Temp:  [97.8 F (36.6 C)-98.2 F (36.8 C)] 98.1 F (36.7 C) (02/12 0324) Pulse Rate:  [86-91] 90 (02/12 0324) Resp:  [14-24] 20 (02/12 0324) BP: (104-130)/(56-77) 120/75 (02/12 0324) SpO2:  [92 %-100 %] 97 % (02/12 0324) Last BM Date: 10/30/19(per dtr, its been 2 days)  Intake/Output from previous day: 02/11 0701 - 02/12 0700 In: 60 [P.O.:60] Out: 300 [Urine:300] Intake/Output this shift: No intake/output data recorded.  Exam: On Bipap Abdomen obese, soft, non-tender, incision clean  Lab Results:  Recent Labs    11/01/19 1622  WBC 7.4  HGB 10.8*  HCT 38.1  PLT 188   BMET Recent Labs    11/01/19 2318 11/02/19 0514  NA 142 144  K 4.8 4.3  CL 103 102  CO2 29 30  GLUCOSE 82 78  BUN 24* 26*  CREATININE 1.74* 1.70*  CALCIUM 9.0 9.2   PT/INR Recent Labs    11/01/19 1622  LABPROT 19.7*  INR 1.7*   ABG Recent Labs    11/02/19 0015 11/02/19 0313  PHART 7.289* 7.286*  HCO3 33.3* 34.0*    Studies/Results: CT ABDOMEN PELVIS WO CONTRAST  Result Date: 11/01/2019 CLINICAL DATA:  Post ventral hernia repair 3 days ago. Abdominal distention. EXAM: CT ABDOMEN AND PELVIS WITHOUT CONTRAST TECHNIQUE: Multidetector CT imaging of the abdomen and pelvis was performed following the standard protocol without IV contrast. COMPARISON:  03/28/2012 FINDINGS: Lower chest: Small bilateral pleural effusions. Bibasilar atelectasis. Cardiomegaly. Pacer wires noted in the right heart. Hepatobiliary: High-density material seen within the gallbladder may reflect layering stones. No focal hepatic abnormality or biliary ductal dilatation. Pancreas: No focal abnormality or ductal dilatation. Spleen: No focal abnormality.  Normal size. Adrenals/Urinary Tract: Low-density lesions in the mid and lower pole of the left kidney, likely cysts. These cannot be characterized on  this noncontrast study. No hydronephrosis, renal or ureteral stone. Adrenal glands and urinary bladder unremarkable. Stomach/Bowel: Gaseous distention of portions of the colon, predominantly sigmoid colon and transverse colon, likely ileus. There is left colonic diverticulosis. No active diverticulitis. Stomach and small bowel decompressed. Vascular/Lymphatic: Heavily calcified aorta and iliac vessels. No aneurysm or adenopathy. Reproductive: Uterus and adnexa unremarkable.  No mass. Other: Moderate free fluid in the abdomen and pelvis. Extensive subcutaneous edema. Soft tissue noted in the anterior upper abdomen in the region of recent hernia repair, likely postoperative changes. No focal fluid collection to suggest abscess. Musculoskeletal: No acute bony abnormality. IMPRESSION: Gas arc a appearance with small bilateral effusions, moderate ascites, and diffuse subcutaneous edema. Gaseous distention of the colon postoperatively, likely ileus. Aortic atherosclerosis.  Cardiomegaly. Electronically Signed   By: Rolm Baptise M.D.   On: 11/01/2019 18:52   CT Head Wo Contrast  Result Date: 11/01/2019 CLINICAL DATA:  Encephalopathy EXAM: CT HEAD WITHOUT CONTRAST TECHNIQUE: Contiguous axial images were obtained from the base of the skull through the vertex without intravenous contrast. COMPARISON:  08/30/2013 FINDINGS: Brain: Image quality degraded by motion. No acute intracranial abnormality. Specifically, no hemorrhage, hydrocephalus, mass lesion, acute infarction, or significant intracranial injury. Vascular: No hyperdense vessel or unexpected calcification. Skull: No acute calvarial abnormality. Sinuses/Orbits: Visualized paranasal sinuses and mastoids clear. Orbital soft tissues unremarkable. Other: None IMPRESSION: No acute intracranial abnormality. Electronically Signed   By: Rolm Baptise M.D.   On: 11/01/2019 18:53   DG Chest Port 1 View  Result Date: 11/01/2019 CLINICAL DATA:  Short of breath  EXAM:  PORTABLE CHEST 1 VIEW COMPARISON:  11/01/2019 at 4 3 p.m. FINDINGS: Single frontal view of the chest demonstrates stable pacemaker. Cardiac silhouette remains enlarged. Slight improvement in central vascular congestion seen previously. No airspace disease, effusion, or pneumothorax. IMPRESSION: 1. Persistent but improved central vascular congestion. 2. Stable enlargement of the cardiac silhouette. Electronically Signed   By: Randa Ngo M.D.   On: 11/01/2019 23:29   DG Chest Port 1 View  Result Date: 11/01/2019 CLINICAL DATA:  Short of breath and chills and confusion. Recent hernia surgery. History of colon carcinoma treated with radiation and chemotherapy. EXAM: PORTABLE CHEST 1 VIEW COMPARISON:  05/10/2019 FINDINGS: Moderate enlargement of the cardiopericardial silhouette. No mediastinal or hilar masses. There is vascular congestion. No pulmonary edema. Lungs are otherwise clear. No convincing pleural effusion and no pneumothorax. Left anterior chest wall sequential pacemaker. Ventricular lead not included on the field of view, otherwise stable from the prior study. Skeletal structures are grossly intact. IMPRESSION: 1. Moderate cardiomegaly with vascular congestion, but no convincing pulmonary edema. No evidence of pneumonia. No acute findings. Electronically Signed   By: Lajean Manes M.D.   On: 11/01/2019 16:33    Anti-infectives: Anti-infectives (From admission, onward)   None      Assessment/Plan:  Pt with multiple significant medical problems s/p repair of small, symptomatic epigastric hernia with mesh on 2/8  Abdomen is benign on exam and CT.  Suspect her condition is cardiopulmonary in nature.  From a surgical standpoint, she can have a diet and may have physical therapy.   LOS: 1 day    Coralie Keens 11/02/2019

## 2019-11-02 NOTE — Progress Notes (Signed)
RT called to patient room due to patient needing to go from CPAP to Bipap and get transferred to [place].  Patient taken off of CPAP and placed on bipap and is currently tolerating well at this time.  Patient transferred from 5N07 to room 7N37 without complications, on bipap.  Will continue to monitor.

## 2019-11-02 NOTE — Progress Notes (Addendum)
Pt arrived from ED with lethargy. Spoke with dtr [name], and she stated this is not her Baseline. Dr. Lara Mulch at the bedside to evaluate pt. New orders placed(see Mar and orders). Pt placed on Cpap , ABG lab drawn and CXR at the bedside completed. Tele Monitor placed. Pt incont. Of urine, large amount. Bath given. Total linen change completed. Pure wick placed.  Daughter [name] called and updated concerning above at 2350.

## 2019-11-02 NOTE — Progress Notes (Signed)
PROGRESS NOTE    [name] [name]  SWF:[id number] DOB: 11/19/1945 DOA: 11/01/2019 PCP: Biagio Borg, MD   Brief Narrative: Patient is a 74 year old female with history of COPD, chronic diastolic CHF, sleep apnea, paroxysmal A. fib, chronic anemia, CKD stage III with baseline creatinine of 1.3, sick sinus syndrome status post sick sinus syndrome, hypothyroidism, status post ventral hernia surgery about 5 days ago and was discharged the following day became lethargic, had decreased oral intake and was brought to the emergency department.  On presentation, she was found to have AKI.  CT imaging of abdomen and pelvis showed fluid overload, features of IBS.  She was lethargic.  Requiring 2 L of oxygen per minute.  General surgery requested for our consultation and we are assuming care.  She was admitted for the management of acute on chronic respiratory failure with hypoxia and hypercarbia due to congestive heart failure.  She had to be put on BiPAP.  Assessment & Plan:   Principal Problem:   Acute on chronic diastolic CHF (congestive heart failure) (HCC) Active Problems:   Hypothyroidism, acquired   COPD GOLD III   Atrial fibrillation (HCC)   Chronic respiratory failure (HCC)   Ileus (HCC)   Acute encephalopathy    Acute on chronic respiratory failure with hypoxia/hypercarbia: Presented with shortness of breath, altered mental status from hypercarbia.  ABG showed CO2 in the range of 70s.  Put on BiPAP. She is on oxygen at 3 L/min at home   Acute on chronic diastolic CHF: EF as per last echo was 55 to 60% with grade 1 diastolic dysfunction.  Patient had significant edema with features of anasarca.  Started on Lasix 60 mg IV every 12.  BNP elevated. Monitor daily weight, input/output.She takes Lasix 60 mg twice a day at home.  She follows with cardiology.  Acute metabolic encephalopathy: Most likely secondary to hypercarbia.  ABG showed hypercarbia.  Started on BiPAP.  Continue to monitor  ABG. CT head was unremarkable.  Ammonia  level normal. Currently she is alert and oriented.  AKI on CKD stage IIIb: Presented with volume overload.  Likley cardiorenal syndrome.Baseline creatinine around 1.6.  Kidney function at baseline now.  Continue Lasix.  Monitor BMP  COPD: Not wheezing at this time.  Has hypercarbia.  Continue BiPAP.  She follows with pulmonology.  She is on 3 L of oxygen per minute at home.  Paroxysmal A. fib: Currently normal sinus rhythm.  On metoprolol and Eliquis  History of sick sinus syndrome: Status post pacemaker  Chronic normocytic anemia: Could be associated  with chronic renal disease.  Continue to monitor.  H&H stable  Sleep apnea: On cpap at night at home  History of gout: On allopurinol  Hyperlipidemia: On statin  Recent ventral hernia surgery: general surgery following.  Denies any abdominal pain.  History of depression: On Cymbalta  Hypothyroidism: Continue Synthyroid         DVT prophylaxis:Eliquis Code Status: Full Family Communication: Daughter on phone on 11/02/2019 Disposition Plan: Patient is from home.  She is clinically stable for discharge because of ongoing respiratory failure.  She is still on BiPAP.  Expect discharge to home when she is stable.  Needs physical therapy evaluation   Consultants: General surgery  Procedures: None  Antimicrobials:  Anti-infectives (From admission, onward)   None      Subjective: Patient seen and examined the bedside this morning.  She was hemodynamically stable during my evaluation.  She was on BiPAP.  She  was alert and oriented.  She said she feels better.  Denies any abdomen pain or not in respiratory distress  Objective: Vitals:   11/01/19 1917 11/01/19 2049 11/02/19 0028 11/02/19 0324  BP: 109/61 (!) 104/56  120/75  Pulse: 87 86 86 90  Resp: 19 18 (!) 22 20  Temp:  97.8 F (36.6 C)  98.1 F (36.7 C)  TempSrc:  Oral  Oral  SpO2: 98% 96% 92% 97%    Intake/Output Summary  (Last 24 hours) at 11/02/2019 0749 Last data filed at 11/02/2019 0100 Gross per 24 hour  Intake 60 ml  Output 300 ml  Net -240 ml   There were no vitals filed for this visit.  Examination:  General exam: On bipap Respiratory system: Bilateral decreased air entry on the bases, few crackles Cardiovascular system: S1 & S2 heard, RRR. No JVD, murmurs, rubs, gallops or clicks. 2-3 + pedal edema. Gastrointestinal system: Abdomen is obese. Mildly distended , soft and nontender. No organomegaly or masses felt. Normal bowel sounds heard.Clean surgical scar Central nervous system: Alert and oriented. No focal neurological deficits. Extremities: 2-3 + bilateral lower extremity edema, no clubbing ,no cyanosis Skin: No rashes, lesions or ulcers,no icterus ,no pallor    Data Reviewed: I have personally reviewed following labs and imaging studies  CBC: Recent Labs  Lab 10/30/19 0750 11/01/19 1622  WBC 9.3 7.4  NEUTROABS  --  5.9  HGB 10.5* 10.8*  HCT 37.4 38.1  MCV 104.5* 104.4*  PLT 136* 559   Basic Metabolic Panel: Recent Labs  Lab 11/01/19 1622 11/01/19 2318 11/02/19 0514  NA 141 142 144  K 5.6* 4.8 4.3  CL 99 103 102  CO2 32 29 30  GLUCOSE 89 82 78  BUN 26* 24* 26*  CREATININE 1.89* 1.74* 1.70*  CALCIUM 9.1 9.0 9.2   GFR: Estimated Creatinine Clearance: 38.7 mL/min (A) (by C-G formula based on SCr of 1.7 mg/dL (H)). Liver Function Tests: Recent Labs  Lab 11/01/19 1622  AST 31  ALT 20  ALKPHOS 144*  BILITOT 1.5*  PROT 6.6  ALBUMIN 3.3*   No results for input(s): LIPASE, AMYLASE in the last 168 hours. Recent Labs  Lab 11/01/19 2318  AMMONIA 38*   Coagulation Profile: Recent Labs  Lab 11/01/19 1622  INR 1.7*   Cardiac Enzymes: No results for input(s): CKTOTAL, CKMB, CKMBINDEX, TROPONINI in the last 168 hours. BNP (last 3 results) No results for input(s): PROBNP in the last 8760 hours. HbA1C: No results for input(s): HGBA1C in the last 72  hours. CBG: Recent Labs  Lab 11/01/19 1553 11/02/19 0150  GLUCAP 79 76   Lipid Profile: No results for input(s): CHOL, HDL, LDLCALC, TRIG, CHOLHDL, LDLDIRECT in the last 72 hours. Thyroid Function Tests: No results for input(s): TSH, T4TOTAL, FREET4, T3FREE, THYROIDAB in the last 72 hours. Anemia Panel: No results for input(s): VITAMINB12, FOLATE, FERRITIN, TIBC, IRON, RETICCTPCT in the last 72 hours. Sepsis Labs: Recent Labs  Lab 11/01/19 1622 11/01/19 1700  LATICACIDVEN 1.5 1.6    Recent Results (from the past 240 hour(s))  SARS CORONAVIRUS 2 (TAT 6-24 HRS) Nasopharyngeal Nasopharyngeal Swab     Status: None   Collection Time: 10/25/19 12:42 PM   Specimen: Nasopharyngeal Swab  Result Value Ref Range Status   SARS Coronavirus 2 NEGATIVE NEGATIVE Final    Comment: (NOTE) SARS-CoV-2 target nucleic acids are NOT DETECTED. The SARS-CoV-2 RNA is generally detectable in upper and lower respiratory specimens during the acute phase of infection.  Negative results do not preclude SARS-CoV-2 infection, do not rule out co-infections with other pathogens, and should not be used as the sole basis for treatment or other patient management decisions. Negative results must be combined with clinical observations, patient history, and epidemiological information. The expected result is Negative. Fact Sheet for Patients: SugarRoll.be Fact Sheet for Healthcare Providers: https://www.woods-mathews.com/ This test is not yet approved or cleared by the [place] FDA and  has been authorized for detection and/or diagnosis of SARS-CoV-2 by FDA under an Emergency Use Authorization (EUA). This EUA will remain  in effect (meaning this test can be used) for the duration of the COVID-19 declaration under Section 56 4(b)(1) of the Act, 21 U.S.C. section 360bbb-3(b)(1), unless the authorization is terminated or revoked sooner. Performed at [facility], [place] [address] [place], [place] [address]   Respiratory Panel by RT PCR (Flu A&B, Covid) - Nasopharyngeal Swab     Status: None   Collection Time: 11/01/19  4:23 PM   Specimen: Nasopharyngeal Swab  Result Value Ref Range Status   SARS Coronavirus 2 by RT PCR NEGATIVE NEGATIVE Final    Comment: (NOTE) SARS-CoV-2 target nucleic acids are NOT DETECTED. The SARS-CoV-2 RNA is generally detectable in upper respiratoy specimens during the acute phase of infection. The lowest concentration of SARS-CoV-2 viral copies this assay can detect is 131 copies/mL. A negative result does not preclude SARS-Cov-2 infection and should not be used as the sole basis for treatment or other patient management decisions. A negative result may occur with  improper specimen collection/handling, submission of specimen other than nasopharyngeal swab, presence of viral mutation(s) within the areas targeted by this assay, and inadequate number of viral copies (<131 copies/mL). A negative result must be combined with clinical observations, patient history, and epidemiological information. The expected result is Negative. Fact Sheet for Patients:  PinkCheek.be Fact Sheet for Healthcare Providers:  GravelBags.it This test is not yet ap proved or cleared by the [place] FDA and  has been authorized for detection and/or diagnosis of SARS-CoV-2 by FDA under an Emergency Use Authorization (EUA). This EUA will remain  in effect (meaning this test can be used) for the duration of the COVID-19 declaration under Section 564(b)(1) of the Act, 21 U.S.C. section 360bbb-3(b)(1), unless the authorization is terminated or revoked sooner.    Influenza A by PCR NEGATIVE NEGATIVE Final   Influenza B by PCR NEGATIVE NEGATIVE Final    Comment: (NOTE) The Xpert Xpress SARS-CoV-2/FLU/RSV assay is intended as an aid in  the diagnosis of influenza from  Nasopharyngeal swab specimens and  should not be used as a sole basis for treatment. Nasal washings and  aspirates are unacceptable for Xpert Xpress SARS-CoV-2/FLU/RSV  testing. Fact Sheet for Patients: PinkCheek.be Fact Sheet for Healthcare Providers: GravelBags.it This test is not yet approved or cleared by the [place] FDA and  has been authorized for detection and/or diagnosis of SARS-CoV-2 by  FDA under an Emergency Use Authorization (EUA). This EUA will remain  in effect (meaning this test can be used) for the duration of the  Covid-19 declaration under Section 564(b)(1) of the Act, 21  U.S.C. section 360bbb-3(b)(1), unless the authorization is  terminated or revoked. Performed at [facility], [place] [address], [place], [place] [address]          Radiology Studies: CT ABDOMEN PELVIS WO CONTRAST  Result Date: 11/01/2019 CLINICAL DATA:  Post ventral hernia repair 3 days ago. Abdominal distention. EXAM: CT  ABDOMEN AND PELVIS WITHOUT CONTRAST TECHNIQUE: Multidetector CT imaging of the abdomen and pelvis was performed following the standard protocol without IV contrast. COMPARISON:  03/28/2012 FINDINGS: Lower chest: Small bilateral pleural effusions. Bibasilar atelectasis. Cardiomegaly. Pacer wires noted in the right heart. Hepatobiliary: High-density material seen within the gallbladder may reflect layering stones. No focal hepatic abnormality or biliary ductal dilatation. Pancreas: No focal abnormality or ductal dilatation. Spleen: No focal abnormality.  Normal size. Adrenals/Urinary Tract: Low-density lesions in the mid and lower pole of the left kidney, likely cysts. These cannot be characterized on this noncontrast study. No hydronephrosis, renal or ureteral stone. Adrenal glands and urinary bladder unremarkable. Stomach/Bowel: Gaseous distention of portions of the colon, predominantly sigmoid colon and transverse  colon, likely ileus. There is left colonic diverticulosis. No active diverticulitis. Stomach and small bowel decompressed. Vascular/Lymphatic: Heavily calcified aorta and iliac vessels. No aneurysm or adenopathy. Reproductive: Uterus and adnexa unremarkable.  No mass. Other: Moderate free fluid in the abdomen and pelvis. Extensive subcutaneous edema. Soft tissue noted in the anterior upper abdomen in the region of recent hernia repair, likely postoperative changes. No focal fluid collection to suggest abscess. Musculoskeletal: No acute bony abnormality. IMPRESSION: Gas arc a appearance with small bilateral effusions, moderate ascites, and diffuse subcutaneous edema. Gaseous distention of the colon postoperatively, likely ileus. Aortic atherosclerosis.  Cardiomegaly. Electronically Signed   By: Rolm Baptise M.D.   On: 11/01/2019 18:52   CT Head Wo Contrast  Result Date: 11/01/2019 CLINICAL DATA:  Encephalopathy EXAM: CT HEAD WITHOUT CONTRAST TECHNIQUE: Contiguous axial images were obtained from the base of the skull through the vertex without intravenous contrast. COMPARISON:  08/30/2013 FINDINGS: Brain: Image quality degraded by motion. No acute intracranial abnormality. Specifically, no hemorrhage, hydrocephalus, mass lesion, acute infarction, or significant intracranial injury. Vascular: No hyperdense vessel or unexpected calcification. Skull: No acute calvarial abnormality. Sinuses/Orbits: Visualized paranasal sinuses and mastoids clear. Orbital soft tissues unremarkable. Other: None IMPRESSION: No acute intracranial abnormality. Electronically Signed   By: Rolm Baptise M.D.   On: 11/01/2019 18:53   DG Chest Port 1 View  Result Date: 11/01/2019 CLINICAL DATA:  Short of breath EXAM: PORTABLE CHEST 1 VIEW COMPARISON:  11/01/2019 at 4 3 p.m. FINDINGS: Single frontal view of the chest demonstrates stable pacemaker. Cardiac silhouette remains enlarged. Slight improvement in central vascular congestion seen  previously. No airspace disease, effusion, or pneumothorax. IMPRESSION: 1. Persistent but improved central vascular congestion. 2. Stable enlargement of the cardiac silhouette. Electronically Signed   By: Randa Ngo M.D.   On: 11/01/2019 23:29   DG Chest Port 1 View  Result Date: 11/01/2019 CLINICAL DATA:  Short of breath and chills and confusion. Recent hernia surgery. History of colon carcinoma treated with radiation and chemotherapy. EXAM: PORTABLE CHEST 1 VIEW COMPARISON:  05/10/2019 FINDINGS: Moderate enlargement of the cardiopericardial silhouette. No mediastinal or hilar masses. There is vascular congestion. No pulmonary edema. Lungs are otherwise clear. No convincing pleural effusion and no pneumothorax. Left anterior chest wall sequential pacemaker. Ventricular lead not included on the field of view, otherwise stable from the prior study. Skeletal structures are grossly intact. IMPRESSION: 1. Moderate cardiomegaly with vascular congestion, but no convincing pulmonary edema. No evidence of pneumonia. No acute findings. Electronically Signed   By: Lajean Manes M.D.   On: 11/01/2019 16:33        Scheduled Meds: . allopurinol  300 mg Oral Daily  . amitriptyline  100 mg Oral QHS  . apixaban  5 mg Oral BID  .  DULoxetine  60 mg Oral QHS  . ferrous sulfate  325 mg Oral QHS  . furosemide  60 mg Intravenous BID  . guaiFENesin  1,200 mg Oral BID  . levothyroxine  200 mcg Oral Daily   Continuous Infusions:   LOS: 1 day    Time spent: 35 mins.More than 50% of that time was spent in counseling and/or coordination of care.      Shelly Coss, MD [facility] P2/08/2020, 7:49 AM

## 2019-11-02 NOTE — Plan of Care (Signed)
  Problem: Skin Integrity: Goal: Risk for impaired skin integrity will decrease Outcome: Progressing   Problem: Safety: Goal: Ability to remain free from injury will improve Outcome: Progressing   Problem: Elimination: Goal: Will not experience complications related to bowel motility Outcome: Progressing   Problem: Clinical Measurements: Goal: Cardiovascular complication will be avoided Outcome: Progressing   Problem: Clinical Measurements: Goal: Respiratory complications will improve Outcome: Progressing   Problem: Clinical Measurements: Goal: Diagnostic test results will improve Outcome: Progressing

## 2019-11-02 NOTE — Telephone Encounter (Signed)
Spoke with Maudie Mercury at [facility] regarding pt Rx for metoprolol tartrate. Pt is currently admitted. Kim requests clarification on pt sig. Pt med was refilled on 09/28/2019 with the following sig:   Sig: Take 0.5 tablets (12.5 mg total) by mouth as needed (for heart rate above 120 at rest).  Patient taking differently: Take 25 mg by mouth 2 (two) times daily as needed (for heart rate above 120 at rest).     She does not know which instructions should be followed. Triage nurse unable to find info in chart on why pt may be taking med differently. Per last OV note on 05/25/2019 by Almyra Deforest, PA-C, pt Plan of Care includes the following:   "PAF: Continue Eliquis.  Recent cardioversion in the ED.  Her beta-blocker was discontinued earlier this year due to soft blood pressure.  I recommended she take 12.58m metoprolol on a as needed basis for resting heart rate greater than 120.  She is maintaining sinus rhythm"  Also reviewed the following pharmacy note  For metoprolol tartrate on pt current medlist: Medication Notes:   FCorky Mull CPhT -- 11/02/2019 10:00  >> FCorky Mull Fri Nov 02, 2019 10:00 AM    DDessie Coma CPhT -- 11/01/2019 22:47  >> DMaryjo RochesterFeb 11, 2021 10:47 PM  Calling MD Friday morning- patient unable to provide specifics   Pt was unable to provided specifics on med instructions. Dr. CStanford Breedand MEulas Post PA-C not in office to advise. Consulted Dr. CHarrell Gavewho advised to use instructions from [facility] OV.  Contacted Kim to update. She verbalized understanding

## 2019-11-02 NOTE — Evaluation (Signed)
Physical Therapy Evaluation Patient Details Name: [name] [name] MRN: [id number] DOB: [birth year] Today's Date: 11/02/2019   History of Present Illness  The pt is a 74 yo female presenting with AMS and hypoxia, found to have acute CHF exacerbation. Pt PMH includes: CKD III, COPD, a-fib, anemia, gout, and recent hernia surgery with d/c 2/9.  Clinical Impression  Pt in bed on BiPAP upon PT arrival, RT arrived to remove, and then pt agreeable to work with PT. The pt presents with limitations in functional mobility, activity tolerance, and endurance compared to her prior level of function and independence due to above dx. The pt was able to demonstrate good initiation for bed mobility, but requires min.modA to complete transfer to EOB. The pt was then able to stand and take short lateral steps to the recliner with min guard and RW for safety. The pt will continue to benefit from skilled PT to progress ambulation and activity tolerance prior to d/c.      Follow Up Recommendations Home health PT;Supervision/Assistance - 24 hour    Equipment Recommendations  (pt has needed equipment)    Recommendations for Other Services       Precautions / Restrictions Precautions Precautions: Fall Restrictions Weight Bearing Restrictions: No      Mobility  Bed Mobility Overal bed mobility: Needs Assistance Bed Mobility: Sidelying to Sit   Sidelying to sit: Min assist;Mod assist;HOB elevated       General bed mobility comments: pt initiates BLE, needs minA to complete motion to EOB, modA to assist trunk elevation  Transfers Overall transfer level: Needs assistance Equipment used: Rolling walker (2 wheeled) Transfers: Sit to/from Stand Sit to Stand: Min guard         General transfer comment: ming for safety, no assist needed  Ambulation/Gait Ambulation/Gait assistance: Min guard Gait Distance (Feet): 5 Feet Assistive device: Rolling walker (2 wheeled) Gait Pattern/deviations: Step-to  pattern;Shuffle   Gait velocity interpretation: <1.31 ft/sec, indicative of household ambulator General Gait Details: minimal clearance bilaterally, but able to side step to chair with min guard for safety and line management  Stairs            Wheelchair Mobility    Modified Rankin (Stroke Patients Only)       Balance Overall balance assessment: Needs assistance Sitting-balance support: Single extremity supported;Feet supported Sitting balance-Leahy Scale: Fair     Standing balance support: Bilateral upper extremity supported;During functional activity Standing balance-Leahy Scale: Poor Standing balance comment: reliant on BUE support                             Pertinent Vitals/Pain Pain Assessment: No/denies pain    Home Living Family/patient expects to be discharged to:: Private residence Living Arrangements: Alone Available Help at Discharge: Family(daughter lives nearby, neighbors can check on her) Type of Home: House Home Access: Stairs to enter Entrance Stairs-Rails: Right Entrance Stairs-Number of Steps: 2 Home Layout: One level Home Equipment: Environmental consultant - 2 wheels;Walker - 4 wheels;Cane - quad;Cane - single point;Bedside commode;Shower seat;Grab bars - tub/shower;Hand held shower head Additional Comments: 2L O2 at home    Prior Function Level of Independence: Independent with assistive device(s)         Comments: pt says she is mostly independent at baseline (but that most things take extra time) with use of rollator. Daughter drives and assists with errands     Hand Dominance   Dominant Hand: Right    Extremity/Trunk  Assessment   Upper Extremity Assessment Upper Extremity Assessment: Overall WFL for tasks assessed    Lower Extremity Assessment Lower Extremity Assessment: Overall WFL for tasks assessed;Generalized weakness(limited endurance)    Cervical / Trunk Assessment Cervical / Trunk Assessment: Normal  Communication    Communication: No difficulties  Cognition Arousal/Alertness: Awake/alert Behavior During Therapy: WFL for tasks assessed/performed Overall Cognitive Status: Within Functional Limits for tasks assessed                                        General Comments      Exercises     Assessment/Plan    PT Assessment Patient needs continued PT services  PT Problem List Decreased strength;Decreased mobility;Decreased range of motion;Obesity;Decreased activity tolerance;Cardiopulmonary status limiting activity;Decreased balance       PT Treatment Interventions DME instruction;Therapeutic exercise;Gait training;Balance training;Stair training;Functional mobility training;Therapeutic activities;Patient/family education    PT Goals (Current goals can be found in the Care Plan section)  Acute Rehab PT Goals Patient Stated Goal: return home PT Goal Formulation: With patient Time For Goal Achievement: 11/16/19    Frequency Min 3X/week   Barriers to discharge        Co-evaluation               AM-PAC PT "6 Clicks" Mobility  Outcome Measure Help needed turning from your back to your side while in a flat bed without using bedrails?: A Little Help needed moving from lying on your back to sitting on the side of a flat bed without using bedrails?: A Little Help needed moving to and from a bed to a chair (including a wheelchair)?: A Little Help needed standing up from a chair using your arms (e.g., wheelchair or bedside chair)?: A Little Help needed to walk in hospital room?: A Little Help needed climbing 3-5 steps with a railing? : A Lot 6 Click Score: 17    End of Session Equipment Utilized During Treatment: Gait belt;Oxygen Activity Tolerance: Patient tolerated treatment well Patient left: in chair;with call bell/phone within reach Nurse Communication: Mobility status PT Visit Diagnosis: Difficulty in walking, not elsewhere classified (R26.2)    Time:  0981-1914 PT Time Calculation (min) (ACUTE ONLY): 32 min   Charges:   PT Evaluation $PT Eval Moderate Complexity: 1 Mod PT Treatments $Gait Training: 8-22 mins        Karma Ganja, PT, DPT   Acute Rehabilitation Department Pager #: [phone]  Otho Bellows 11/02/2019, 3:31 PM

## 2019-11-02 NOTE — Telephone Encounter (Signed)
Maudie Mercury, with [facility] is calling to request the patient's medication information. Maudie Mercury states that she has concerns in regards to. Please call to  metoprolol tartrate (LOPRESSOR)    medication discuss.

## 2019-11-02 NOTE — Progress Notes (Signed)
RT changed CPAP on dream station to BiPAP on DS because of repeat gas.

## 2019-11-02 NOTE — Progress Notes (Signed)
Spoke with Dr. Lara Mulch in reference to Critical PCO2 Lab result. Orders to transfer placed  pt needs Bipap. RT [facility] aware, called and followed up. Noted change from dream station to [place] at bedside.   Daughter [name] called and updated. Please call when room is assigned. [phone].

## 2019-11-03 LAB — BASIC METABOLIC PANEL
Anion gap: 13 (ref 5–15)
BUN: 23 mg/dL (ref 8–23)
CO2: 34 mmol/L — ABNORMAL HIGH (ref 22–32)
Calcium: 9.1 mg/dL (ref 8.9–10.3)
Chloride: 99 mmol/L (ref 98–111)
Creatinine, Ser: 1.48 mg/dL — ABNORMAL HIGH (ref 0.44–1.00)
GFR calc Af Amer: 40 mL/min — ABNORMAL LOW (ref >60)
GFR calc non Af Amer: 35 mL/min — ABNORMAL LOW (ref >60)
Glucose, Bld: 76 mg/dL (ref 70–99)
Potassium: 3.8 mmol/L (ref 3.5–5.1)
Sodium: 146 mmol/L — ABNORMAL HIGH (ref 135–145)

## 2019-11-03 LAB — CBC WITH DIFFERENTIAL/PLATELET
Abs Immature Granulocytes: 0.04 10*3/uL (ref 0.00–0.07)
Basophils Absolute: 0 10*3/uL (ref 0.0–0.1)
Basophils Relative: 0 %
Eosinophils Absolute: 0.4 10*3/uL (ref 0.0–0.5)
Eosinophils Relative: 5 %
HCT: 40.7 % (ref 36.0–46.0)
Hemoglobin: 12 g/dL (ref 12.0–15.0)
Immature Granulocytes: 1 %
Lymphocytes Relative: 9 %
Lymphs Abs: 0.7 10*3/uL (ref 0.7–4.0)
MCH: 29.2 pg (ref 26.0–34.0)
MCHC: 29.5 g/dL — ABNORMAL LOW (ref 30.0–36.0)
MCV: 99 fL (ref 80.0–100.0)
Monocytes Absolute: 0.8 10*3/uL (ref 0.1–1.0)
Monocytes Relative: 10 %
Neutro Abs: 5.7 10*3/uL (ref 1.7–7.7)
Neutrophils Relative %: 75 %
Platelets: 179 10*3/uL (ref 150–400)
RBC: 4.11 MIL/uL (ref 3.87–5.11)
RDW: 17.6 % — ABNORMAL HIGH (ref 11.5–15.5)
WBC: 7.7 10*3/uL (ref 4.0–10.5)
nRBC: 0.3 % — ABNORMAL HIGH (ref 0.0–0.2)

## 2019-11-03 MED ORDER — SENNOSIDES-DOCUSATE SODIUM 8.6-50 MG PO TABS
1.0000 | ORAL_TABLET | Freq: Two times a day (BID) | ORAL | Status: DC
  Administered 2019-11-03 – 2019-11-06 (×7): 1 via ORAL
  Filled 2019-11-03 (×7): qty 1

## 2019-11-03 MED ORDER — POLYETHYLENE GLYCOL 3350 17 G PO PACK
17.0000 g | PACK | Freq: Every day | ORAL | Status: DC
  Administered 2019-11-03 – 2019-11-06 (×4): 17 g via ORAL
  Filled 2019-11-03 (×4): qty 1

## 2019-11-03 NOTE — Progress Notes (Signed)
Pt had an uneventful day. Will continue to monitor.

## 2019-11-03 NOTE — Progress Notes (Signed)
[name] [name] [id number] [birth year]  CARE TEAM:  PCP: Biagio Borg, MD  Outpatient Care Team: Patient Care Team: Biagio Borg, MD as PCP - General (Internal Medicine) Stanford Breed Denice Bors, MD as PCP - Cardiology (Cardiology)  Inpatient Treatment Team: Treatment Team: Attending Provider: Shelly Coss, MD; Consulting Physician: Marcheta Grammes, MD; Rounding Team: Coralie Keens, MD; Registered Nurse: Idolina Primer, RN; Technician: Max Sane, NT; Registered Nurse: Sherron Flemings, RN; Social Worker: Latanya Maudlin; Case Manager: Claudie Leach, RN; Student Nurse: Marcelline Mates, [place], Student-RN   Problem List:   Principal Problem:   Acute on chronic diastolic CHF (congestive heart failure) ([place]) Active Problems:   Hypothyroidism, acquired   COPD GOLD III   Atrial fibrillation ([place])   Chronic respiratory failure ([place])   Ileus ([place])   Acute encephalopathy    SURGERY 10/29/2019   Pre-op Diagnosis: EPIGASTRIC HERNIA     Post-op Diagnosis: same  Procedure(s): EPIGASTRIC HERNIA REPAIR WITH MESH  (6.4 cm round prolene patch from [place])  Surgeon(s): Coralie Keens, MD   Assessment  Stabilizing.  Acute on chronic respiratory failure with hypoxia/hypercarbia  [facility] Stay = 2 days)  Plan:  -Solid diet as tolerated -Pain control.  Try to minimize narcotics. -Acute on chronic renal failure resolving. -Very anxious to go home.  Will defer to medicine when they feel it is safe to transition back.  Challenge in this patient with baseline oxygen requirements. -VTE prophylaxis- SCDs, etc -mobilize as tolerated to help recovery  20 minutes spent in review, evaluation, examination, counseling, and coordination of care.  More than 50% of that time was spent in counseling.  11/03/2019    Subjective: (Chief complaint)  Patient denies abdominal pain  Tolerating solid diet.    Frustrated and wants to go home.  Objective:  Vital signs:  Vitals:   11/03/19 0440 11/03/19 0442 11/03/19 0840 11/03/19 0925  BP: 106/84  127/60   Pulse: 97  86   Resp: 20  (!) 23 19  Temp:  97.7 F (36.5 C)    TempSrc:  Oral    SpO2: 99%  92% 95%  Weight:  134.3 kg      Last BM Date: 10/31/19  Intake/Output   Yesterday:  02/12 0701 - 02/13 0700 In: 800 [P.O.:800] Out: 4950 [Urine:4950] This shift:  Total I/O In: -  Out: 900 [Urine:900]  Bowel function:  Flatus: YES  BM:  No  Drain: (No drain)   Physical Exam:  General: Pt awake/alert in no acute distress Eyes: PERRL, normal EOM.  Sclera clear.  No icterus Neuro: CN II-XII intact w/o focal sensory/motor deficits. Lymph: No head/neck/groin lymphadenopathy Psych:  No delerium/psychosis/paranoia.  Oriented x 4 HENT: Normocephalic, Mucus membranes moist.  No thrush Neck: Supple, No tracheal deviation.  No obvious thyromegaly Chest: Decreased breath sounds with some conversational dyspnea.  No pain to chest wall compression.  No audible wheezing CV:  Pulses intact.  Regular rhythm.  No major extremity edema MS: Normal AROM mjr joints.  No obvious deformity  Abdomen: Soft.  Mildy distended.  Supraumbilical incision with normal healing ridge.  No cellulitis or abscess.  Nontender.  No evidence of peritonitis.  No incarcerated hernias.  Ext:  No deformity.  No mjr edema.  No cyanosis Skin: No petechiae / purpurea.  No major sores.  Warm and dry    Results:   Cultures: Recent Results (from the past 720 hour(s))  SARS CORONAVIRUS 2 (TAT 6-24 HRS) Nasopharyngeal Nasopharyngeal Swab  Status: None   Collection Time: 10/25/19 12:42 PM   Specimen: Nasopharyngeal Swab  Result Value Ref Range Status   SARS Coronavirus 2 NEGATIVE NEGATIVE Final    Comment: (NOTE) SARS-CoV-2 target nucleic acids are NOT DETECTED. The SARS-CoV-2 RNA is generally detectable in upper and lower respiratory specimens during the acute phase of infection. Negative results do not preclude SARS-CoV-2 infection,  do not rule out co-infections with other pathogens, and should not be used as the sole basis for treatment or other patient management decisions. Negative results must be combined with clinical observations, patient history, and epidemiological information. The expected result is Negative. Fact Sheet for Patients: SugarRoll.be Fact Sheet for Healthcare Providers: https://www.woods-mathews.com/ This test is not yet approved or cleared by the [place] FDA and  has been authorized for detection and/or diagnosis of SARS-CoV-2 by FDA under an Emergency Use Authorization (EUA). This EUA will remain  in effect (meaning this test can be used) for the duration of the COVID-19 declaration under Section 56 4(b)(1) of the Act, 21 U.S.C. section 360bbb-3(b)(1), unless the authorization is terminated or revoked sooner. Performed at [facility], [place] [address], [place], [place] [address]   Respiratory Panel by RT PCR (Flu A&B, Covid) - Nasopharyngeal Swab     Status: None   Collection Time: 11/01/19  4:23 PM   Specimen: Nasopharyngeal Swab  Result Value Ref Range Status   SARS Coronavirus 2 by RT PCR NEGATIVE NEGATIVE Final    Comment: (NOTE) SARS-CoV-2 target nucleic acids are NOT DETECTED. The SARS-CoV-2 RNA is generally detectable in upper respiratoy specimens during the acute phase of infection. The lowest concentration of SARS-CoV-2 viral copies this assay can detect is 131 copies/mL. A negative result does not preclude SARS-Cov-2 infection and should not be used as the sole basis for treatment or other patient management decisions. A negative result may occur with  improper specimen collection/handling, submission of specimen other than nasopharyngeal swab, presence of viral mutation(s) within the areas targeted by this assay, and inadequate number of viral copies (<131 copies/mL). A negative result must be combined with  clinical observations, patient history, and epidemiological information. The expected result is Negative. Fact Sheet for Patients:  PinkCheek.be Fact Sheet for Healthcare Providers:  GravelBags.it This test is not yet ap proved or cleared by the [place] FDA and  has been authorized for detection and/or diagnosis of SARS-CoV-2 by FDA under an Emergency Use Authorization (EUA). This EUA will remain  in effect (meaning this test can be used) for the duration of the COVID-19 declaration under Section 564(b)(1) of the Act, 21 U.S.C. section 360bbb-3(b)(1), unless the authorization is terminated or revoked sooner.    Influenza A by PCR NEGATIVE NEGATIVE Final   Influenza B by PCR NEGATIVE NEGATIVE Final    Comment: (NOTE) The Xpert Xpress SARS-CoV-2/FLU/RSV assay is intended as an aid in  the diagnosis of influenza from Nasopharyngeal swab specimens and  should not be used as a sole basis for treatment. Nasal washings and  aspirates are unacceptable for Xpert Xpress SARS-CoV-2/FLU/RSV  testing. Fact Sheet for Patients: PinkCheek.be Fact Sheet for Healthcare Providers: GravelBags.it This test is not yet approved or cleared by the [place] FDA and  has been authorized for detection and/or diagnosis of SARS-CoV-2 by  FDA under an Emergency Use Authorization (EUA). This EUA will remain  in effect (meaning this test can be used) for the duration of the  Covid-19 declaration under Section 564(b)(1) of the Act, 21  U.S.C. section 360bbb-3(b)(1), unless the authorization is  terminated or revoked. Performed at [facility], [place] [address] [place], [place] [address]   Culture, blood (routine x 2)     Status: None (Preliminary result)   Collection Time: 11/01/19  4:25 PM   Specimen: BLOOD RIGHT FOREARM  Result Value Ref Range Status   Specimen Description BLOOD  RIGHT FOREARM  Final   Special Requests   Final    BOTTLES DRAWN AEROBIC AND ANAEROBIC Blood Culture adequate volume   Culture   Final    NO GROWTH < 24 HOURS Performed at [facility], [place] [address], [place], [place] [address]    Report Status PENDING  Incomplete  Culture, blood (routine x 2)     Status: None (Preliminary result)   Collection Time: 11/01/19  5:00 PM   Specimen: BLOOD RIGHT HAND  Result Value Ref Range Status   Specimen Description BLOOD RIGHT HAND  Final   Special Requests AEROBIC BOTTLE ONLY Blood Culture adequate volume  Final   Culture   Final    NO GROWTH < 24 HOURS Performed at [facility], [place] [address] [place], [place] [address]    Report Status PENDING  Incomplete    Labs: Results for orders placed or performed during the hospital encounter of 11/01/19 (from the past 48 hour(s))  CBG monitoring, ED     Status: None   Collection Time: 11/01/19  3:53 PM  Result Value Ref Range   Glucose-Capillary 79 70 - 99 mg/dL   Comment 1 Notify RN    Comment 2 Document in Chart   Comprehensive metabolic panel     Status: Abnormal   Collection Time: 11/01/19  4:22 PM  Result Value Ref Range   Sodium 141 135 - 145 mmol/L   Potassium 5.6 (H) 3.5 - 5.1 mmol/L    Comment: HEMOLYSIS AT THIS LEVEL MAY AFFECT RESULT   Chloride 99 98 - 111 mmol/L   CO2 32 22 - 32 mmol/L   Glucose, Bld 89 70 - 99 mg/dL   BUN 26 (H) 8 - 23 mg/dL   Creatinine, Ser 1.89 (H) 0.44 - 1.00 mg/dL   Calcium 9.1 8.9 - 10.3 mg/dL   Total Protein 6.6 6.5 - 8.1 g/dL   Albumin 3.3 (L) 3.5 - 5.0 g/dL   AST 31 15 - 41 U/L   ALT 20 0 - 44 U/L   Alkaline Phosphatase 144 (H) 38 - 126 U/L   Total Bilirubin 1.5 (H) 0.3 - 1.2 mg/dL   GFR calc non Af Amer 26 (L) >60 mL/min   GFR calc Af Amer 30 (L) >60 mL/min   Anion gap 10 5 - 15    Comment: Performed at [facility], [address] [place], [place] [address]  CBC with Differential     Status: Abnormal   Collection Time: 11/01/19   4:22 PM  Result Value Ref Range   WBC 7.4 4.0 - 10.5 K/uL   RBC 3.65 (L) 3.87 - 5.11 MIL/uL   Hemoglobin 10.8 (L) 12.0 - 15.0 g/dL   HCT 38.1 36.0 - 46.0 %   MCV 104.4 (H) 80.0 - 100.0 fL   MCH 29.6 26.0 - 34.0 pg   MCHC 28.3 (L) 30.0 - 36.0 g/dL   RDW 17.8 (H) 11.5 - 15.5 %   Platelets 188 150 - 400 K/uL   nRBC 0.4 (H) 0.0 - 0.2 %   Neutrophils Relative % 80 %  Neutro Abs 5.9 1.7 - 7.7 K/uL   Lymphocytes Relative 9 %   Lymphs Abs 0.6 (L) 0.7 - 4.0 K/uL   Monocytes Relative 8 %   Monocytes Absolute 0.6 0.1 - 1.0 K/uL   Eosinophils Relative 3 %   Eosinophils Absolute 0.2 0.0 - 0.5 K/uL   Basophils Relative 0 %   Basophils Absolute 0.0 0.0 - 0.1 K/uL   Immature Granulocytes 0 %   Abs Immature Granulocytes 0.03 0.00 - 0.07 K/uL    Comment: Performed at [facility] [address] [place], [place] [address]  Protime-INR     Status: Abnormal   Collection Time: 11/01/19  4:22 PM  Result Value Ref Range   Prothrombin Time 19.7 (H) 11.4 - 15.2 seconds   INR 1.7 (H) 0.8 - 1.2    Comment: (NOTE) INR goal varies based on device and disease states. Performed at [facility], [place] [address], [place], [place] [address]   Lactic acid, plasma     Status: None   Collection Time: 11/01/19  4:22 PM  Result Value Ref Range   Lactic Acid, Venous 1.5 0.5 - 1.9 mmol/L    Comment: Performed at [facility] [address], [place], [place] [address]  Troponin I (High Sensitivity)     Status: Abnormal   Collection Time: 11/01/19  4:22 PM  Result Value Ref Range   Troponin I (High Sensitivity) 221 (HH) <18 ng/L    Comment: CRITICAL RESULT CALLED TO, READ BACK BY AND VERIFIED WITH: Jonelle Sidle 1751 11/01/2019 WBOND (NOTE) Elevated high sensitivity troponin I (hsTnI) values and significant  changes across serial measurements may suggest ACS but many other  chronic and acute conditions are known to elevate hsTnI results.  Refer to the Links section for chest pain algorithms  and additional  guidance. Performed at [facility], [place] [address] [place], [place] [address]   Brain natriuretic peptide     Status: Abnormal   Collection Time: 11/01/19  4:23 PM  Result Value Ref Range   B Natriuretic Peptide 1,065.5 (H) 0.0 - 100.0 pg/mL    Comment: Performed at [facility] [address], [place], [place] [address]  Respiratory Panel by RT PCR (Flu A&B, Covid) - Nasopharyngeal Swab     Status: None   Collection Time: 11/01/19  4:23 PM   Specimen: Nasopharyngeal Swab  Result Value Ref Range   SARS Coronavirus 2 by RT PCR NEGATIVE NEGATIVE    Comment: (NOTE) SARS-CoV-2 target nucleic acids are NOT DETECTED. The SARS-CoV-2 RNA is generally detectable in upper respiratoy specimens during the acute phase of infection. The lowest concentration of SARS-CoV-2 viral copies this assay can detect is 131 copies/mL. A negative result does not preclude SARS-Cov-2 infection and should not be used as the sole basis for treatment or other patient management decisions. A negative result may occur with  improper specimen collection/handling, submission of specimen other than nasopharyngeal swab, presence of viral mutation(s) within the areas targeted by this assay, and inadequate number of viral copies (<131 copies/mL). A negative result must be combined with clinical observations, patient history, and epidemiological information. The expected result is Negative. Fact Sheet for Patients:  PinkCheek.be Fact Sheet for Healthcare Providers:  GravelBags.it This test is not yet ap proved or cleared by the [place] FDA and  has been authorized for detection and/or diagnosis of SARS-CoV-2 by FDA under an Emergency Use Authorization (EUA). This EUA will remain  in effect (meaning this test can be used) for the duration of the COVID-19 declaration under Section 564(b)(1) of the Act, 21 U.S.C. section  360bbb-3(b)(1), unless the authorization is terminated or revoked sooner.    Influenza A by PCR NEGATIVE NEGATIVE   Influenza B by PCR NEGATIVE NEGATIVE    Comment: (NOTE) The Xpert Xpress SARS-CoV-2/FLU/RSV assay is intended as an aid in  the diagnosis of influenza from Nasopharyngeal swab specimens and  should not be used as a sole basis for treatment. Nasal washings and  aspirates are unacceptable for Xpert Xpress SARS-CoV-2/FLU/RSV  testing. Fact Sheet for Patients: PinkCheek.be Fact Sheet for Healthcare Providers: GravelBags.it This test is not yet approved or cleared by the [place] FDA and  has been authorized for detection and/or diagnosis of SARS-CoV-2 by  FDA under an Emergency Use Authorization (EUA). This EUA will remain  in effect (meaning this test can be used) for the duration of the  Covid-19 declaration under Section 564(b)(1) of the Act, 21  U.S.C. section 360bbb-3(b)(1), unless the authorization is  terminated or revoked. Performed at [facility], [place] [address], [place], [place] [address]   Type and screen [place]     Status: None   Collection Time: 11/01/19  4:25 PM  Result Value Ref Range   ABO/RH(D) O POS    Antibody Screen NEG    Sample Expiration      11/04/2019,2359 Performed at [facility], [place] [address], [place], [place] [address]   Culture, blood (routine x 2)     Status: None (Preliminary result)   Collection Time: 11/01/19  4:25 PM   Specimen: BLOOD RIGHT FOREARM  Result Value Ref Range   Specimen Description BLOOD RIGHT FOREARM    Special Requests      BOTTLES DRAWN AEROBIC AND ANAEROBIC Blood Culture adequate volume   Culture      NO GROWTH < 24 HOURS Performed at [facility], [place] [address] [place], [place] [address]    Report Status PENDING   ABO/Rh     Status: None   Collection Time: 11/01/19  4:25 PM  Result Value Ref Range    ABO/RH(D)      O POS Performed at [facility] [address] [place], [place] [address]   Lactic acid, plasma     Status: None   Collection Time: 11/01/19  5:00 PM  Result Value Ref Range   Lactic Acid, Venous 1.6 0.5 - 1.9 mmol/L    Comment: Performed at [facility] [address], [place], [place] [address]  Culture, blood (routine x 2)     Status: None (Preliminary result)   Collection Time: 11/01/19  5:00 PM   Specimen: BLOOD RIGHT HAND  Result Value Ref Range   Specimen Description BLOOD RIGHT HAND    Special Requests AEROBIC BOTTLE ONLY Blood Culture adequate volume    Culture      NO GROWTH < 24 HOURS Performed at [facility], [place] [address], [place], [place] [address]    Report Status PENDING   Troponin I (High Sensitivity)     Status: Abnormal   Collection Time: 11/01/19  7:29 PM  Result Value Ref Range   Troponin I (High Sensitivity) 192 (HH) <18 ng/L    Comment: CRITICAL VALUE NOTED.  VALUE IS CONSISTENT WITH PREVIOUSLY REPORTED AND CALLED VALUE. (NOTE) Elevated high sensitivity troponin I (hsTnI) values and significant  changes across  serial measurements may suggest ACS but many other  chronic and acute conditions are known to elevate hsTnI results.  Refer to the Links section for chest pain algorithms and additional  guidance. Performed at [facility], [place] [address] [place], [place] [address]   Basic metabolic panel     Status: Abnormal   Collection Time: 11/01/19 11:18 PM  Result Value Ref Range   Sodium 142 135 - 145 mmol/L   Potassium 4.8 3.5 - 5.1 mmol/L   Chloride 103 98 - 111 mmol/L   CO2 29 22 - 32 mmol/L   Glucose, Bld 82 70 - 99 mg/dL   BUN 24 (H) 8 - 23 mg/dL   Creatinine, Ser 1.74 (H) 0.44 - 1.00 mg/dL   Calcium 9.0 8.9 - 10.3 mg/dL   GFR calc non Af Amer 29 (L) >60 mL/min   GFR calc Af Amer 33 (L) >60 mL/min   Anion gap 10 5 - 15    Comment: Performed at [facility], [place] [address], [place], [place]  [address]  Troponin I (High Sensitivity)     Status: Abnormal   Collection Time: 11/01/19 11:18 PM  Result Value Ref Range   Troponin I (High Sensitivity) 170 (HH) <18 ng/L    Comment: CRITICAL VALUE NOTED.  VALUE IS CONSISTENT WITH PREVIOUSLY REPORTED AND CALLED VALUE. Performed at [facility], [place] [address], [place], [place] [address]   Ammonia     Status: Abnormal   Collection Time: 11/01/19 11:18 PM  Result Value Ref Range   Ammonia 38 (H) 9 - 35 umol/L    Comment: Performed at [facility], [place] [address] [place], [place] [address]  Blood gas, arterial     Status: Abnormal   Collection Time: 11/02/19 12:15 AM  Result Value Ref Range   FIO2 30.00    pH, Arterial 7.289 (L) 7.350 - 7.450   pCO2 arterial 71.0 (HH) 32.0 - 48.0 mmHg    Comment: CRITICAL RESULT CALLED TO, READ BACK BY AND VERIFIED WITH: VERNICIA G, RN AT 0036 ON 11/02/2019 BY SAINVILUS S    pO2, Arterial 70.1 (L) 83.0 - 108.0 mmHg   Bicarbonate 33.3 (H) 20.0 - 28.0 mmol/L   Acid-Base Excess 6.8 (H) 0.0 - 2.0 mmol/L   O2 Saturation 92.2 %   Patient temperature 36.5    Collection site RIGHT RADIAL    Drawn by COLLECTED BY RT     Comment: [id number]   Sample type ARTERIAL    Allens test (pass/fail) PASS PASS    Comment: Performed at [facility], [place] [address] [place], [place] [address]  Glucose, capillary     Status: None   Collection Time: 11/02/19  1:50 AM  Result Value Ref Range   Glucose-Capillary 76 70 - 99 mg/dL  Blood gas, arterial     Status: Abnormal   Collection Time: 11/02/19  3:13 AM  Result Value Ref Range   FIO2 32.00    pH, Arterial 7.286 (L) 7.350 - 7.450   pCO2 arterial 73.2 (HH) 32.0 - 48.0 mmHg    Comment: CRITICAL RESULT CALLED TO, READ BACK BY AND VERIFIED WITH: GRAVES V, RN AT [phone] ON 11/02/2019 BY SAINVILUS S    pO2, Arterial 103 83.0 - 108.0 mmHg   Bicarbonate 34.0 (H) 20.0 - 28.0 mmol/L   Acid-Base Excess 7.4 (H) 0.0 - 2.0 mmol/L   O2 Saturation 97.4 %   Patient  temperature 36.5  Collection site RIGHT RADIAL    Drawn by COLLECTED BY RT     Comment: [id number]   Sample type ARTERIAL    Allens test (pass/fail) PASS PASS    Comment: Performed at [facility], [place] [address] [place], [place] [address]  Basic metabolic panel     Status: Abnormal   Collection Time: 11/02/19  5:14 AM  Result Value Ref Range   Sodium 144 135 - 145 mmol/L   Potassium 4.3 3.5 - 5.1 mmol/L   Chloride 102 98 - 111 mmol/L   CO2 30 22 - 32 mmol/L   Glucose, Bld 78 70 - 99 mg/dL   BUN 26 (H) 8 - 23 mg/dL   Creatinine, Ser 1.70 (H) 0.44 - 1.00 mg/dL   Calcium 9.2 8.9 - 10.3 mg/dL   GFR calc non Af Amer 29 (L) >60 mL/min   GFR calc Af Amer 34 (L) >60 mL/min   Anion gap 12 5 - 15    Comment: Performed at [facility], [place] [address], [place], [place] [address]  Blood gas, arterial     Status: Abnormal   Collection Time: 11/02/19 12:48 PM  Result Value Ref Range   FIO2 30.00    pH, Arterial 7.372 7.350 - 7.450   pCO2 arterial 60.4 (H) 32.0 - 48.0 mmHg   pO2, Arterial 75.0 (L) 83.0 - 108.0 mmHg   Bicarbonate 34.3 (H) 20.0 - 28.0 mmol/L   Acid-Base Excess 8.9 (H) 0.0 - 2.0 mmol/L   O2 Saturation 94.1 %   Patient temperature 37.0    Collection site RIGHT RADIAL    Drawn by COLLECTED BY RT     Comment: MENDIE CAMPBELL RRT   Sample type ARTERIAL    Allens test (pass/fail) PASS PASS    Comment: Performed at [facility], [place] [address] [place], [place] [address]  CBC with Differential/Platelet     Status: Abnormal   Collection Time: 11/03/19  4:41 AM  Result Value Ref Range   WBC 7.7 4.0 - 10.5 K/uL   RBC 4.11 3.87 - 5.11 MIL/uL   Hemoglobin 12.0 12.0 - 15.0 g/dL   HCT 40.7 36.0 - 46.0 %   MCV 99.0 80.0 - 100.0 fL   MCH 29.2 26.0 - 34.0 pg   MCHC 29.5 (L) 30.0 - 36.0 g/dL   RDW 17.6 (H) 11.5 - 15.5 %   Platelets 179 150 - 400 K/uL   nRBC 0.3 (H) 0.0 - 0.2 %   Neutrophils Relative % 75 %   Neutro Abs 5.7 1.7 - 7.7 K/uL   Lymphocytes Relative 9 %    Lymphs Abs 0.7 0.7 - 4.0 K/uL   Monocytes Relative 10 %   Monocytes Absolute 0.8 0.1 - 1.0 K/uL   Eosinophils Relative 5 %   Eosinophils Absolute 0.4 0.0 - 0.5 K/uL   Basophils Relative 0 %   Basophils Absolute 0.0 0.0 - 0.1 K/uL   Immature Granulocytes 1 %   Abs Immature Granulocytes 0.04 0.00 - 0.07 K/uL    Comment: Performed at [facility] [address], [place],  [address]  Basic metabolic panel     Status: Abnormal   Collection Time: 11/03/19  4:41 AM  Result Value Ref Range   Sodium 146 (H) 135 - 145 mmol/L   Potassium 3.8 3.5 - 5.1 mmol/L   Chloride 99 98 - 111 mmol/L   CO2 34 (H) 22 - 32 mmol/L   Glucose, Bld  76 70 - 99 mg/dL   BUN 23 8 - 23 mg/dL   Creatinine, Ser 1.48 (H) 0.44 - 1.00 mg/dL   Calcium 9.1 8.9 - 10.3 mg/dL   GFR calc non Af Amer 35 (L) >60 mL/min   GFR calc Af Amer 40 (L) >60 mL/min   Anion gap 13 5 - 15    Comment: Performed at [facility] [address], [place], [place] [address]    Imaging / Studies: CT ABDOMEN PELVIS WO CONTRAST  Result Date: 11/01/2019 CLINICAL DATA:  Post ventral hernia repair 3 days ago. Abdominal distention. EXAM: CT ABDOMEN AND PELVIS WITHOUT CONTRAST TECHNIQUE: Multidetector CT imaging of the abdomen and pelvis was performed following the standard protocol without IV contrast. COMPARISON:  03/28/2012 FINDINGS: Lower chest: Small bilateral pleural effusions. Bibasilar atelectasis. Cardiomegaly. Pacer wires noted in the right heart. Hepatobiliary: High-density material seen within the gallbladder may reflect layering stones. No focal hepatic abnormality or biliary ductal dilatation. Pancreas: No focal abnormality or ductal dilatation. Spleen: No focal abnormality.  Normal size. Adrenals/Urinary Tract: Low-density lesions in the mid and lower pole of the left kidney, likely cysts. These cannot be characterized on this noncontrast study. No hydronephrosis, renal or ureteral stone. Adrenal glands and urinary  bladder unremarkable. Stomach/Bowel: Gaseous distention of portions of the colon, predominantly sigmoid colon and transverse colon, likely ileus. There is left colonic diverticulosis. No active diverticulitis. Stomach and small bowel decompressed. Vascular/Lymphatic: Heavily calcified aorta and iliac vessels. No aneurysm or adenopathy. Reproductive: Uterus and adnexa unremarkable.  No mass. Other: Moderate free fluid in the abdomen and pelvis. Extensive subcutaneous edema. Soft tissue noted in the anterior upper abdomen in the region of recent hernia repair, likely postoperative changes. No focal fluid collection to suggest abscess. Musculoskeletal: No acute bony abnormality. IMPRESSION: Gas arc a appearance with small bilateral effusions, moderate ascites, and diffuse subcutaneous edema. Gaseous distention of the colon postoperatively, likely ileus. Aortic atherosclerosis.  Cardiomegaly. Electronically Signed   By: Rolm Baptise M.D.   On: 11/01/2019 18:52   CT Head Wo Contrast  Result Date: 11/01/2019 CLINICAL DATA:  Encephalopathy EXAM: CT HEAD WITHOUT CONTRAST TECHNIQUE: Contiguous axial images were obtained from the base of the skull through the vertex without intravenous contrast. COMPARISON:  08/30/2013 FINDINGS: Brain: Image quality degraded by motion. No acute intracranial abnormality. Specifically, no hemorrhage, hydrocephalus, mass lesion, acute infarction, or significant intracranial injury. Vascular: No hyperdense vessel or unexpected calcification. Skull: No acute calvarial abnormality. Sinuses/Orbits: Visualized paranasal sinuses and mastoids clear. Orbital soft tissues unremarkable. Other: None IMPRESSION: No acute intracranial abnormality. Electronically Signed   By: Rolm Baptise M.D.   On: 11/01/2019 18:53   DG Chest Port 1 View  Result Date: 11/01/2019 CLINICAL DATA:  Short of breath EXAM: PORTABLE CHEST 1 VIEW COMPARISON:  11/01/2019 at 4 3 p.m. FINDINGS: Single frontal view of the chest  demonstrates stable pacemaker. Cardiac silhouette remains enlarged. Slight improvement in central vascular congestion seen previously. No airspace disease, effusion, or pneumothorax. IMPRESSION: 1. Persistent but improved central vascular congestion. 2. Stable enlargement of the cardiac silhouette. Electronically Signed   By: Randa Ngo M.D.   On: 11/01/2019 23:29   DG Chest Port 1 View  Result Date: 11/01/2019 CLINICAL DATA:  Short of breath and chills and confusion. Recent hernia surgery. History of colon carcinoma treated with radiation and chemotherapy. EXAM: PORTABLE CHEST 1 VIEW COMPARISON:  05/10/2019 FINDINGS: Moderate enlargement of the cardiopericardial silhouette. No mediastinal or hilar masses. There is vascular  congestion. No pulmonary edema. Lungs are otherwise clear. No convincing pleural effusion and no pneumothorax. Left anterior chest wall sequential pacemaker. Ventricular lead not included on the field of view, otherwise stable from the prior study. Skeletal structures are grossly intact. IMPRESSION: 1. Moderate cardiomegaly with vascular congestion, but no convincing pulmonary edema. No evidence of pneumonia. No acute findings. Electronically Signed   By: Lajean Manes M.D.   On: 11/01/2019 16:33    Medications / Allergies: per chart  Antibiotics: Anti-infectives (From admission, onward)   None        Note: Portions of this report may have been transcribed using voice recognition software. Every effort was made to ensure accuracy; however, inadvertent computerized transcription errors may be present.   Any transcriptional errors that result from this process are unintentional.     Adin Hector, MD, FACS, MASCRS [facility]astrointestinal and [facility]ally Invasive Surgery    [address] [place], [place] [address] [phone] Main / Paging [phone] Fax Please see Amion for pager number, especial 5pm - 7am.

## 2019-11-03 NOTE — Progress Notes (Signed)
Per RT, pt was expressing need to go home. Pt oriented to self and situation. Disoriented to date. Reorientation offered. Verbalized understanding. Pt is still very drowsy this morning. VSS. Generalized weakness.

## 2019-11-03 NOTE — Progress Notes (Signed)
PROGRESS NOTE    [name] [name]  JSE:[id number] DOB: 1946-05-30 DOA: 11/01/2019 PCP: Biagio Borg, MD   Brief Narrative: Patient is a 74 year old female with history of COPD, chronic diastolic CHF, sleep apnea, paroxysmal A. fib, chronic anemia, CKD stage III with baseline creatinine of 1.3, sick sinus syndrome status post sick sinus syndrome, hypothyroidism, status post ventral hernia surgery about 5 days ago before admission date and was discharged the following day became lethargic, had decreased oral intake and was brought to the emergency department.  On presentation, she was found to have AKI.  CT imaging of abdomen and pelvis showed fluid overload, features of IBS.  She was found to be  Lethargic and confused .  General surgery requested for our consultation and we are assuming care.  She was admitted for the management of acute on chronic respiratory failure with hypoxia and hypercarbia due to congestive heart failure.  She had to be put on BiPAP.  Respiratory status improving and she is on nasal cannula.  Assessment & Plan:   Principal Problem:   Acute on chronic diastolic CHF (congestive heart failure) (HCC) Active Problems:   Hypothyroidism, acquired   COPD GOLD III   Atrial fibrillation (HCC)   Chronic respiratory failure (HCC)   Ileus (HCC)   Acute encephalopathy    Acute on chronic respiratory failure with hypoxia/hypercarbia: Presented with shortness of breath, altered mental status from hypercarbia.  ABG showed CO2 in the range of 70s.  Put on BiPAP. She is on oxygen at 3 L/min at home. Respiratory status is improved.  Currently she is on 3 L of oxygen per minute maintaining saturation.  Acute on chronic diastolic CHF: EF as per last echo was 55 to 60% with grade 1 diastolic dysfunction.  Patient had significant edema with features of anasarca.  Started on Lasix 80 mg IV every 12.  BNP elevated. Monitor daily weight, input/output.She takes Lasix 60 mg twice a day at home.   She follows with cardiology. She had net output of 4 L yesterday.  Acute metabolic encephalopathy:Most likely secondary to hypercarbia.  ABG had shown hypercarbia.CT head was unremarkable.  Ammonia  level normal. Currently she is alert and oriented.  AKI on CKD stage IIIb: Presented with volume overload.  Likley cardiorenal syndrome.Baseline creatinine around 1.6.  Kidney function at baseline now.  Continue Lasix.  Monitor BMP  COPD: Not wheezing at this time.  Has hypercarbia.  Continue BiPAP.  She follows with pulmonology.  She is on 3 L of oxygen per minute at home.  Paroxysmal A. fib: Currently normal sinus rhythm.  On metoprolol and Eliquis  History of sick sinus syndrome: Status post pacemaker  Chronic normocytic anemia: Could be associated  with chronic renal disease.  Continue to monitor.  H&H stable  Sleep apnea: On cpap at night at home  History of gout: On allopurinol  Hyperlipidemia: On statin  Recent ventral hernia surgery: general surgery following.  Denies any abdominal pain.  History of depression: On Cymbalta  Hypothyroidism: Continue Synthyroid         DVT prophylaxis:Eliquis Code Status: Full Family Communication: Daughter on phone on 11/02/2019 Disposition Plan: Patient is from home.  She is not clinically stable for discharge because she is still volume overloaded and needs IV Lasix.  Expect discharge to home when she is stable.PT recommended home health PT.  Consultants: General surgery  Procedures: None  Antimicrobials:  Anti-infectives (From admission, onward)   None  Subjective: Patient seen and examined the bedside this morning.  Hemodynamically stable.  Looked comfortable today.  On nasal cannula maintaining her saturation.  Denies any chest pain or shortness of breath.  She was very eager to go home.  Objective: Vitals:   11/03/19 0440 11/03/19 0442 11/03/19 0840 11/03/19 0925  BP: 106/84  127/60   Pulse: 97  86   Resp: 20  (!) 23  19  Temp:  97.7 F (36.5 C)    TempSrc:  Oral    SpO2: 99%  92% 95%  Weight:  134.3 kg      Intake/Output Summary (Last 24 hours) at 11/03/2019 1026 Last data filed at 11/03/2019 0900 Gross per 24 hour  Intake 800 ml  Output 4950 ml  Net -4150 ml   Filed Weights   11/03/19 0442  Weight: 134.3 kg    Examination:   General exam: Morbidly obese, generalized weakness Resp: No wheezes or crackles, decreased bilateral air entry in the bases  Cardiovascular system: S1 & S2 heard, RRR. No JVD, murmurs, rubs, gallops or clicks. Gastrointestinal system: Abdomen is obese, soft and nontender. No organomegaly or masses felt. Normal bowel sounds heard. Central nervous system: Alert and oriented. No focal neurological deficits. Extremities: 2-3 + bilateral lower extremity edema, no clubbing ,no cyanosis. Skin: No rashes, lesions or ulcers,no icterus ,no pallor    Data Reviewed: I have personally reviewed following labs and imaging studies  CBC: Recent Labs  Lab 10/30/19 0750 11/01/19 1622 11/03/19 0441  WBC 9.3 7.4 7.7  NEUTROABS  --  5.9 5.7  HGB 10.5* 10.8* 12.0  HCT 37.4 38.1 40.7  MCV 104.5* 104.4* 99.0  PLT 136* 188 169   Basic Metabolic Panel: Recent Labs  Lab 11/01/19 1622 11/01/19 2318 11/02/19 0514 11/03/19 0441  NA 141 142 144 146*  K 5.6* 4.8 4.3 3.8  CL 99 103 102 99  CO2 32 29 30 34*  GLUCOSE 89 82 78 76  BUN 26* 24* 26* 23  CREATININE 1.89* 1.74* 1.70* 1.48*  CALCIUM 9.1 9.0 9.2 9.1   GFR: Estimated Creatinine Clearance: 44.8 mL/min (A) (by C-G formula based on SCr of 1.48 mg/dL (H)). Liver Function Tests: Recent Labs  Lab 11/01/19 1622  AST 31  ALT 20  ALKPHOS 144*  BILITOT 1.5*  PROT 6.6  ALBUMIN 3.3*   No results for input(s): LIPASE, AMYLASE in the last 168 hours. Recent Labs  Lab 11/01/19 2318  AMMONIA 38*   Coagulation Profile: Recent Labs  Lab 11/01/19 1622  INR 1.7*   Cardiac Enzymes: No results for input(s): CKTOTAL,  CKMB, CKMBINDEX, TROPONINI in the last 168 hours. BNP (last 3 results) No results for input(s): PROBNP in the last 8760 hours. HbA1C: No results for input(s): HGBA1C in the last 72 hours. CBG: Recent Labs  Lab 11/01/19 1553 11/02/19 0150  GLUCAP 79 76   Lipid Profile: No results for input(s): CHOL, HDL, LDLCALC, TRIG, CHOLHDL, LDLDIRECT in the last 72 hours. Thyroid Function Tests: No results for input(s): TSH, T4TOTAL, FREET4, T3FREE, THYROIDAB in the last 72 hours. Anemia Panel: No results for input(s): VITAMINB12, FOLATE, FERRITIN, TIBC, IRON, RETICCTPCT in the last 72 hours. Sepsis Labs: Recent Labs  Lab 11/01/19 1622 11/01/19 1700  LATICACIDVEN 1.5 1.6    Recent Results (from the past 240 hour(s))  SARS CORONAVIRUS 2 (TAT 6-24 HRS) Nasopharyngeal Nasopharyngeal Swab     Status: None   Collection Time: 10/25/19 12:42 PM   Specimen: Nasopharyngeal Swab  Result Value  Ref Range Status   SARS Coronavirus 2 NEGATIVE NEGATIVE Final    Comment: (NOTE) SARS-CoV-2 target nucleic acids are NOT DETECTED. The SARS-CoV-2 RNA is generally detectable in upper and lower respiratory specimens during the acute phase of infection. Negative results do not preclude SARS-CoV-2 infection, do not rule out co-infections with other pathogens, and should not be used as the sole basis for treatment or other patient management decisions. Negative results must be combined with clinical observations, patient history, and epidemiological information. The expected result is Negative. Fact Sheet for Patients: SugarRoll.be Fact Sheet for Healthcare Providers: https://www.woods-mathews.com/ This test is not yet approved or cleared by the [place] FDA and  has been authorized for detection and/or diagnosis of SARS-CoV-2 by FDA under an Emergency Use Authorization (EUA). This EUA will remain  in effect (meaning this test can be used) for the duration of  the COVID-19 declaration under Section 56 4(b)(1) of the Act, 21 U.S.C. section 360bbb-3(b)(1), unless the authorization is terminated or revoked sooner. Performed at [facility], [place] [address] [place], [place] [address]   Respiratory Panel by RT PCR (Flu A&B, Covid) - Nasopharyngeal Swab     Status: None   Collection Time: 11/01/19  4:23 PM   Specimen: Nasopharyngeal Swab  Result Value Ref Range Status   SARS Coronavirus 2 by RT PCR NEGATIVE NEGATIVE Final    Comment: (NOTE) SARS-CoV-2 target nucleic acids are NOT DETECTED. The SARS-CoV-2 RNA is generally detectable in upper respiratoy specimens during the acute phase of infection. The lowest concentration of SARS-CoV-2 viral copies this assay can detect is 131 copies/mL. A negative result does not preclude SARS-Cov-2 infection and should not be used as the sole basis for treatment or other patient management decisions. A negative result may occur with  improper specimen collection/handling, submission of specimen other than nasopharyngeal swab, presence of viral mutation(s) within the areas targeted by this assay, and inadequate number of viral copies (<131 copies/mL). A negative result must be combined with clinical observations, patient history, and epidemiological information. The expected result is Negative. Fact Sheet for Patients:  PinkCheek.be Fact Sheet for Healthcare Providers:  GravelBags.it This test is not yet ap proved or cleared by the [place] FDA and  has been authorized for detection and/or diagnosis of SARS-CoV-2 by FDA under an Emergency Use Authorization (EUA). This EUA will remain  in effect (meaning this test can be used) for the duration of the COVID-19 declaration under Section 564(b)(1) of the Act, 21 U.S.C. section 360bbb-3(b)(1), unless the authorization is terminated or revoked sooner.    Influenza A by PCR NEGATIVE NEGATIVE  Final   Influenza B by PCR NEGATIVE NEGATIVE Final    Comment: (NOTE) The Xpert Xpress SARS-CoV-2/FLU/RSV assay is intended as an aid in  the diagnosis of influenza from Nasopharyngeal swab specimens and  should not be used as a sole basis for treatment. Nasal washings and  aspirates are unacceptable for Xpert Xpress SARS-CoV-2/FLU/RSV  testing. Fact Sheet for Patients: PinkCheek.be Fact Sheet for Healthcare Providers: GravelBags.it This test is not yet approved or cleared by the [place] FDA and  has been authorized for detection and/or diagnosis of SARS-CoV-2 by  FDA under an Emergency Use Authorization (EUA). This EUA will remain  in effect (meaning this test can be used) for the duration of the  Covid-19 declaration under Section 564(b)(1) of the Act, 21  U.S.C. section 360bbb-3(b)(1), unless the authorization is  terminated or revoked. Performed at [facility],  [address], [place], [place] [address]   Culture, blood (routine x 2)     Status: None (Preliminary result)   Collection Time: 11/01/19  4:25 PM   Specimen: BLOOD RIGHT FOREARM  Result Value Ref Range Status   Specimen Description BLOOD RIGHT FOREARM  Final   Special Requests   Final    BOTTLES DRAWN AEROBIC AND ANAEROBIC Blood Culture adequate volume   Culture   Final    NO GROWTH < 24 HOURS Performed at [facility], [place] [address], [place], [place] [address]    Report Status PENDING  Incomplete  Culture, blood (routine x 2)     Status: None (Preliminary result)   Collection Time: 11/01/19  5:00 PM   Specimen: BLOOD RIGHT HAND  Result Value Ref Range Status   Specimen Description BLOOD RIGHT HAND  Final   Special Requests AEROBIC BOTTLE ONLY Blood Culture adequate volume  Final   Culture   Final    NO GROWTH < 24 HOURS Performed at [facility], [place] [address], [place], [place] [address]    Report Status PENDING  Incomplete          Radiology Studies: CT ABDOMEN PELVIS WO CONTRAST  Result Date: 11/01/2019 CLINICAL DATA:  Post ventral hernia repair 3 days ago. Abdominal distention. EXAM: CT ABDOMEN AND PELVIS WITHOUT CONTRAST TECHNIQUE: Multidetector CT imaging of the abdomen and pelvis was performed following the standard protocol without IV contrast. COMPARISON:  03/28/2012 FINDINGS: Lower chest: Small bilateral pleural effusions. Bibasilar atelectasis. Cardiomegaly. Pacer wires noted in the right heart. Hepatobiliary: High-density material seen within the gallbladder may reflect layering stones. No focal hepatic abnormality or biliary ductal dilatation. Pancreas: No focal abnormality or ductal dilatation. Spleen: No focal abnormality.  Normal size. Adrenals/Urinary Tract: Low-density lesions in the mid and lower pole of the left kidney, likely cysts. These cannot be characterized on this noncontrast study. No hydronephrosis, renal or ureteral stone. Adrenal glands and urinary bladder unremarkable. Stomach/Bowel: Gaseous distention of portions of the colon, predominantly sigmoid colon and transverse colon, likely ileus. There is left colonic diverticulosis. No active diverticulitis. Stomach and small bowel decompressed. Vascular/Lymphatic: Heavily calcified aorta and iliac vessels. No aneurysm or adenopathy. Reproductive: Uterus and adnexa unremarkable.  No mass. Other: Moderate free fluid in the abdomen and pelvis. Extensive subcutaneous edema. Soft tissue noted in the anterior upper abdomen in the region of recent hernia repair, likely postoperative changes. No focal fluid collection to suggest abscess. Musculoskeletal: No acute bony abnormality. IMPRESSION: Gas arc a appearance with small bilateral effusions, moderate ascites, and diffuse subcutaneous edema. Gaseous distention of the colon postoperatively, likely ileus. Aortic atherosclerosis.  Cardiomegaly. Electronically Signed   By: Rolm Baptise M.D.   On: 11/01/2019  18:52   CT Head Wo Contrast  Result Date: 11/01/2019 CLINICAL DATA:  Encephalopathy EXAM: CT HEAD WITHOUT CONTRAST TECHNIQUE: Contiguous axial images were obtained from the base of the skull through the vertex without intravenous contrast. COMPARISON:  08/30/2013 FINDINGS: Brain: Image quality degraded by motion. No acute intracranial abnormality. Specifically, no hemorrhage, hydrocephalus, mass lesion, acute infarction, or significant intracranial injury. Vascular: No hyperdense vessel or unexpected calcification. Skull: No acute calvarial abnormality. Sinuses/Orbits: Visualized paranasal sinuses and mastoids clear. Orbital soft tissues unremarkable. Other: None IMPRESSION: No acute intracranial abnormality. Electronically Signed   By: Rolm Baptise M.D.   On: 11/01/2019 18:53   DG Chest Port 1 View  Result Date: 11/01/2019 CLINICAL DATA:  Short of breath EXAM: PORTABLE  CHEST 1 VIEW COMPARISON:  11/01/2019 at 4 3 p.m. FINDINGS: Single frontal view of the chest demonstrates stable pacemaker. Cardiac silhouette remains enlarged. Slight improvement in central vascular congestion seen previously. No airspace disease, effusion, or pneumothorax. IMPRESSION: 1. Persistent but improved central vascular congestion. 2. Stable enlargement of the cardiac silhouette. Electronically Signed   By: Randa Ngo M.D.   On: 11/01/2019 23:29   DG Chest Port 1 View  Result Date: 11/01/2019 CLINICAL DATA:  Short of breath and chills and confusion. Recent hernia surgery. History of colon carcinoma treated with radiation and chemotherapy. EXAM: PORTABLE CHEST 1 VIEW COMPARISON:  05/10/2019 FINDINGS: Moderate enlargement of the cardiopericardial silhouette. No mediastinal or hilar masses. There is vascular congestion. No pulmonary edema. Lungs are otherwise clear. No convincing pleural effusion and no pneumothorax. Left anterior chest wall sequential pacemaker. Ventricular lead not included on the field of view, otherwise  stable from the prior study. Skeletal structures are grossly intact. IMPRESSION: 1. Moderate cardiomegaly with vascular congestion, but no convincing pulmonary edema. No evidence of pneumonia. No acute findings. Electronically Signed   By: Lajean Manes M.D.   On: 11/01/2019 16:33        Scheduled Meds: . allopurinol  300 mg Oral Daily  . amitriptyline  100 mg Oral QHS  . apixaban  5 mg Oral BID  . DULoxetine  60 mg Oral QHS  . ferrous sulfate  325 mg Oral QHS  . furosemide  80 mg Intravenous BID  . guaiFENesin  1,200 mg Oral BID  . levothyroxine  200 mcg Oral Daily   Continuous Infusions:   LOS: 2 days    Time spent: 35 mins.More than 50% of that time was spent in counseling and/or coordination of care.      Shelly Coss, MD [facility] P2/13/2021, 10:26 AM

## 2019-11-03 NOTE — Plan of Care (Signed)
°  Problem: Clinical Measurements: °Goal: Respiratory complications will improve °Outcome: Progressing °Goal: Cardiovascular complication will be avoided °Outcome: Progressing °  °Problem: Activity: °Goal: Risk for activity intolerance will decrease °Outcome: Progressing °  °

## 2019-11-03 NOTE — Discharge Instructions (Signed)
COPD and Physical Activity Chronic obstructive pulmonary disease (COPD) is a long-term (chronic) condition that affects the lungs. COPD is a general term that can be used to describe many different lung problems that cause lung swelling (inflammation) and limit airflow, including chronic bronchitis and emphysema. The main symptom of COPD is shortness of breath, which makes it harder to do even simple tasks. This can also make it harder to exercise and be active. Talk with your health care provider about treatments to help you breathe better and actions you can take to prevent breathing problems during physical activity. What are the benefits of exercising with COPD? Exercising regularly is an important part of a healthy lifestyle. You can still exercise and do physical activities even though you have COPD. Exercise and physical activity improve your shortness of breath by increasing blood flow (circulation). This causes your heart to pump more oxygen through your body. Moderate exercise can improve your:  Oxygen use.  Energy level.  Shortness of breath.  Strength in your breathing muscles.  Heart health.  Sleep.  Self-esteem and feelings of self-worth.  Depression, stress, and anxiety levels. Exercise can benefit everyone with COPD. The severity of your disease may affect how hard you can exercise, especially at first, but everyone can benefit. Talk with your health care provider about how much exercise is safe for you, and which activities and exercises are safe for you. What actions can I take to prevent breathing problems during physical activity?  Sign up for a pulmonary rehabilitation program. This type of program may include: ? Education about lung diseases. ? Exercise classes that teach you how to exercise and be more active while improving your breathing. This usually involves:  Exercise using your lower extremities, such as a stationary bicycle.  About 30 minutes of  exercise, 2 to 5 times per week, for 6 to 12 weeks  Strength training, such as push ups or leg lifts. ? Nutrition education. ? Group classes in which you can talk with others who also have COPD and learn ways to manage stress.  If you use an oxygen tank, you should use it while you exercise. Work with your health care provider to adjust your oxygen for your physical activity. Your resting flow rate is different from your flow rate during physical activity.  While you are exercising: ? Take slow breaths. ? Pace yourself and do not try to go too fast. ? Purse your lips while breathing out. Pursing your lips is similar to a kissing or whistling position. ? If doing exercise that uses a quick burst of effort, such as weight lifting:  Breathe in before starting the exercise.  Breathe out during the hardest part of the exercise (such as raising the weights). Where to find support You can find support for exercising with COPD from:  Your health care provider.  A pulmonary rehabilitation program.  Your local health department or community health programs.  Support groups, online or in-person. Your health care provider may be able to recommend support groups. Where to find more information You can find more information about exercising with COPD from:  American Lung Association: ClassInsider.se.  COPD Foundation: https://www.rivera.net/. Contact a health care provider if:  Your symptoms get worse.  You have chest pain.  You have nausea.  You have a fever.  You have trouble talking or catching your breath.  You want to start a new exercise program or a new activity. Summary  COPD is a general term that  can be used to describe many different lung problems that cause lung swelling (inflammation) and limit airflow. This includes chronic bronchitis and emphysema.  Exercise and physical activity improve your shortness of breath by increasing blood flow (circulation). This causes your heart to  provide more oxygen to your body.  Contact your health care provider before starting any exercise program or new activity. Ask your health care provider what exercises and activities are safe for you. This information is not intended to replace advice given to you by your health care provider. Make sure you discuss any questions you have with your health care provider. Document Revised: 12/27/2018 Document Reviewed: 09/29/2017 Elsevier Patient Education  2020 [place]: POST OP INSTRUCTIONS  ######################################################################  EAT Gradually transition to a high fiber diet with a fiber supplement over the next few weeks after discharge.  Start with a pureed / full liquid diet (see below)  WALK Walk an hour a day.  Control your pain to do that.    CONTROL PAIN Control pain so that you can walk, sleep, tolerate sneezing/coughing, and go up/down stairs.  HAVE A BOWEL MOVEMENT DAILY Keep your bowels regular to avoid problems.  OK to try a laxative to override constipation.  OK to use an antidairrheal to slow down diarrhea.  Call if not better after 2 tries  CALL IF YOU HAVE PROBLEMS/CONCERNS Call if you are still struggling despite following these instructions. Call if you have concerns not answered by these instructions  ######################################################################    1. DIET: Follow a light bland diet & liquids the first 24 hours after arrival home, such as soup, liquids, starches, etc.  Be sure to drink plenty of fluids.  Quickly advance to a usual solid diet within a few days.  Avoid fast food or heavy meals as your are more likely to get nauseated or have irregular bowels.  A low-fat, high-fiber diet for the rest of your life is ideal.   2. Take your usually prescribed home medications unless otherwise directed.  3. PAIN CONTROL: a. Pain is best controlled by a usual combination of three  different methods TOGETHER: i. Ice/Heat ii. Over the counter pain medication iii. Prescription pain medication b. Most patients will experience some swelling and bruising around the hernia(s) such as the bellybutton, groins, or old incisions.  Ice packs or heating pads (30-60 minutes up to 6 times a day) will help. Use ice for the first few days to help decrease swelling and bruising, then switch to heat to help relax tight/sore spots and speed recovery.  Some people prefer to use ice alone, heat alone, alternating between ice & heat.  Experiment to what works for you.  Swelling and bruising can take several weeks to resolve.   c. It is helpful to take an over-the-counter pain medication regularly for the first few weeks.  Choose one of the following that works best for you: i. Naproxen (Aleve, etc)  Two 218m tabs twice a day ii. Ibuprofen (Advil, etc) Three 2066mtabs four times a day (every meal & bedtime) iii. Acetaminophen (Tylenol, etc) 325-65068mour times a day (every meal & bedtime) d. A  prescription for pain medication should be given to you upon discharge.  Take your pain medication as prescribed.  i. If you are having problems/concerns with the prescription medicine (does not control pain, nausea, vomiting, rash, itching, etc), please call us [phone] see if we need to switch you to a different pain  medicine that will work better for you and/or control your side effect better. ii. If you need a refill on your pain medication, please contact your pharmacy.  They will contact our office to request authorization. Prescriptions will not be filled after 5 pm or on week-ends.  4. Avoid getting constipated.  Between the surgery and the pain medications, it is common to experience some constipation.  Increasing fluid intake and taking a fiber supplement (such as Metamucil, Citrucel, FiberCon, MiraLax, etc) 1-2 times a day regularly will usually help prevent this problem from occurring.  A  mild laxative (prune juice, Milk of Magnesia, MiraLax, etc) should be taken according to package directions if there are no bowel movements after 48 hours.    5. Wash / shower every day.  You may shower over the dressings as they are waterproof.    6. Remove your waterproof bandages, skin tapes, and other bandages 5 days after surgery. You may replace a dressing/Band-Aid to cover the incision for comfort if you wish. You may leave the incisions open to air.  You may replace a dressing/Band-Aid to cover an incision for comfort if you wish.  Continue to shower over incision(s) after the dressing is off.  7. ACTIVITIES as tolerated:   a. You may resume regular (light) daily activities beginning the next day--such as daily self-care, walking, climbing stairs--gradually increasing activities as tolerated.  Control your pain so that you can walk an hour a day.  If you can walk 30 minutes without difficulty, it is safe to try more intense activity such as jogging, treadmill, bicycling, low-impact aerobics, swimming, etc. b. Save the most intensive and strenuous activity for last such as sit-ups, heavy lifting, contact sports, etc  Refrain from any heavy lifting or straining until you are off narcotics for pain control.   c. DO NOT PUSH THROUGH PAIN.  Let pain be your guide: If it hurts to do something, don't do it.  Pain is your body warning you to avoid that activity for another week until the pain goes down. d. You may drive when you are no longer taking prescription pain medication, you can comfortably wear a seatbelt, and you can safely maneuver your car and apply brakes. e. Dennis Bast may have sexual intercourse when it is comfortable.   8. FOLLOW UP in our office a. Please call [facility] at [phone] to set up an appointment to see your surgeon in the office for a follow-up appointment approximately 2-3 weeks after your surgery. b. Make sure that you call for this appointment the day you arrive home to insure  a convenient appointment time.  9.  If you have disability of FMLA / Family leave forms, please bring the forms to the office for processing.  (do not give to your surgeon).  WHEN TO CALL us [phone]: 1. Poor pain control 2. Reactions / problems with new medications (rash/itching, nausea, etc)  3. Fever over 101.5 F (38.5 C) 4. Inability to urinate 5. Nausea and/or vomiting 6. Worsening swelling or bruising 7. Continued bleeding from incision. 8. Increased pain, redness, or drainage from the incision   The clinic staff is available to answer your questions during regular business hours (8:30am-5pm).  Please don't hesitate to call and ask to speak to one of our nurses for clinical concerns.   If you have a medical emergency, go to the nearest emergency room or call 911.  A surgeon from [facility] is always on call at the hospitals  in [facility], [place], [place], [place], [place]  [address] ?  [address] [address], [place], [place]   [address] MAIN: [phone] ? TOLL FREE: [phone] ? FAX: [phone] [email or web address]

## 2019-11-04 ENCOUNTER — Inpatient Hospital Stay (HOSPITAL_COMMUNITY): Payer: Medicare HMO

## 2019-11-04 DIAGNOSIS — R609 Edema, unspecified: Secondary | ICD-10-CM

## 2019-11-04 LAB — BASIC METABOLIC PANEL
Anion gap: 9 (ref 5–15)
BUN: 17 mg/dL (ref 8–23)
CO2: 41 mmol/L — ABNORMAL HIGH (ref 22–32)
Calcium: 8.9 mg/dL (ref 8.9–10.3)
Chloride: 94 mmol/L — ABNORMAL LOW (ref 98–111)
Creatinine, Ser: 1.19 mg/dL — ABNORMAL HIGH (ref 0.44–1.00)
GFR calc Af Amer: 52 mL/min — ABNORMAL LOW (ref >60)
GFR calc non Af Amer: 45 mL/min — ABNORMAL LOW (ref >60)
Glucose, Bld: 91 mg/dL (ref 70–99)
Potassium: 3.2 mmol/L — ABNORMAL LOW (ref 3.5–5.1)
Sodium: 144 mmol/L (ref 135–145)

## 2019-11-04 MED ORDER — FUROSEMIDE 40 MG PO TABS
60.0000 mg | ORAL_TABLET | Freq: Two times a day (BID) | ORAL | Status: DC
  Administered 2019-11-04: 09:00:00 60 mg via ORAL
  Filled 2019-11-04: qty 1

## 2019-11-04 MED ORDER — POTASSIUM CHLORIDE CRYS ER 20 MEQ PO TBCR
40.0000 meq | EXTENDED_RELEASE_TABLET | Freq: Once | ORAL | Status: AC
  Administered 2019-11-04: 40 meq via ORAL
  Filled 2019-11-04 (×2): qty 2

## 2019-11-04 MED ORDER — POTASSIUM CHLORIDE CRYS ER 20 MEQ PO TBCR
40.0000 meq | EXTENDED_RELEASE_TABLET | Freq: Once | ORAL | Status: AC
  Administered 2019-11-04: 09:00:00 40 meq via ORAL
  Filled 2019-11-04: qty 2

## 2019-11-04 MED ORDER — FUROSEMIDE 10 MG/ML IJ SOLN
80.0000 mg | Freq: Two times a day (BID) | INTRAMUSCULAR | Status: DC
  Administered 2019-11-04 – 2019-11-05 (×2): 80 mg via INTRAVENOUS
  Filled 2019-11-04 (×2): qty 8

## 2019-11-04 NOTE — Progress Notes (Addendum)
PROGRESS NOTE    [name] [name]  HLK:[id number] DOB: 07-07-1946 DOA: 11/01/2019 PCP: Biagio Borg, MD   Brief Narrative: Patient is a 74 year old female with history of COPD, chronic diastolic CHF, sleep apnea, paroxysmal A. fib, chronic anemia, CKD stage III with baseline creatinine of 1.3, sick sinus syndrome status post sick sinus syndrome, hypothyroidism, status post ventral hernia surgery about 5 days ago before admission date and was discharged the following day became lethargic, had decreased oral intake and was brought to the emergency department.  On presentation, she was found to have AKI.  CT imaging of abdomen and pelvis showed fluid overload, features of IBS.  She was found to be  Lethargic and confused .  General surgery requested for our consultation and we are assuming care.  She was admitted for the management of acute on chronic respiratory failure with hypoxia and hypercarbia due to congestive heart failure.  She had to be put on BiPAP.  Respiratory status improving and she is on nasal cannula.  She needs to be continued on IV Lasix because she still looks volume overloaded.  Assessment & Plan:   Principal Problem:   Acute on chronic diastolic CHF (congestive heart failure) (HCC) Active Problems:   Hypothyroidism, acquired   COPD GOLD III   Atrial fibrillation (HCC)   Chronic respiratory failure (HCC)   Ileus (HCC)   Acute encephalopathy    Acute on chronic respiratory failure with hypoxia/hypercarbia: Presented with shortness of breath, altered mental status from hypercarbia.  ABG showed CO2 in the range of 70s.  Put on BiPAP. She is on oxygen at 3 L/min at home. Respiratory status is improved.  Currently she is on 3 L of oxygen per minute maintaining saturation.  Acute on chronic diastolic CHF: EF as per last echo was 55 to 60% with grade 1 diastolic dysfunction.  Patient had significant edema with features of anasarca.  Started on Lasix 80 mg IV every 12.  BNP  elevated. Monitor daily weight, input/output.She takes Lasix 60 mg twice a day at home.  She follows with cardiology. She had net output of more than 8L since admission.  Right upper extremity edema: Most likely associated  with IV infiltration.  Will check venous Doppler to rule out DVT  Acute metabolic encephalopathy:Most likely secondary to hypercarbia.  ABG had shown hypercarbia.CT head was unremarkable.  Ammonia  level normal. Currently she is alert and oriented.  AKI on CKD stage IIIb: Presented with volume overload.  Likley cardiorenal syndrome.Baseline creatinine around 1.6.  Kidney function at baseline now.  Continue Lasix.  Monitor BMP  COPD: Not wheezing at this time.  Has hypercarbia.  Continue BiPAP.  She follows with pulmonology.  She is on 3 L of oxygen per minute at home.  Paroxysmal A. fib: Currently normal sinus rhythm.  On metoprolol and Eliquis  History of sick sinus syndrome: Status post pacemaker  Chronic normocytic anemia: Could be associated  with chronic renal disease.  Continue to monitor.  H&H stable  Sleep apnea: On cpap at night at home  History of gout: On allopurinol  Hyperlipidemia: On statin  Recent ventral hernia surgery: general surgery following.  Denies any abdominal pain.  History of depression: On Cymbalta  Hypothyroidism: Continue Synthyroid         DVT prophylaxis:Eliquis Code Status: Full Family Communication: Daughter on phone on 11/03/2019 Disposition Plan: Patient is from home.  She is not clinically stable for discharge because she is still volume overloaded and  needs IV Lasix.  Expect discharge to home when she is stable.PT recommended home health PT.  Consultants: General surgery  Procedures: None  Antimicrobials:  Anti-infectives (From admission, onward)   None      Subjective: Patient seen and examined at the bedside this morning.  She still looks very volume overloaded.  She is having good diuresis.  She continues  to require Lasix IV.  She is very eager to home go home and is unhappy  about not being discharged.  I discussed with the daughter yesterday on phone and she was very concerned about her mother being alone at home if she is discharged early.  Objective: Vitals:   11/03/19 1639 11/03/19 1946 11/03/19 2000 11/04/19 0541  BP: 112/72 112/65  109/62  Pulse: 99 98 91 85  Resp: 17 16 16 18   Temp:  97.7 F (36.5 C)  98 F (36.7 C)  TempSrc:  Oral    SpO2: 97% 100% 99% 100%  Weight:    128.5 kg    Intake/Output Summary (Last 24 hours) at 11/04/2019 1135 Last data filed at 11/04/2019 0546 Gross per 24 hour  Intake 320 ml  Output 4300 ml  Net -3980 ml   Filed Weights   11/03/19 0442 11/04/19 0541  Weight: 134.3 kg 128.5 kg    Examination:   General exam: Morbidly obese, not in distress HEENT:PERRL,Oral mucosa moist, Ear/Nose normal on gross exam Respiratory system: No wheezes or crackles Cardiovascular system: S1 & S2 heard, RRR. No JVD, murmurs, rubs, gallops or clicks. Gastrointestinal system: Abdomen is distended, soft and nontender. No organomegaly or masses felt. Normal bowel sounds heard.  Clean surgical wound on the abdomen. Central nervous system: Alert and oriented. No focal neurological deficits. Extremities: 2-3+ bilateral lower extremity edema, no clubbing ,no cyanosis, edema of the left forearm  skin: No rashes, lesions or ulcers,no icterus ,no pallor   Data Reviewed: I have personally reviewed following labs and imaging studies  CBC: Recent Labs  Lab 10/30/19 0750 11/01/19 1622 11/03/19 0441  WBC 9.3 7.4 7.7  NEUTROABS  --  5.9 5.7  HGB 10.5* 10.8* 12.0  HCT 37.4 38.1 40.7  MCV 104.5* 104.4* 99.0  PLT 136* 188 193   Basic Metabolic Panel: Recent Labs  Lab 11/01/19 1622 11/01/19 2318 11/02/19 0514 11/03/19 0441 11/04/19 0423  NA 141 142 144 146* 144  K 5.6* 4.8 4.3 3.8 3.2*  CL 99 103 102 99 94*  CO2 32 29 30 34* 41*  GLUCOSE 89 82 78 76 91  BUN  26* 24* 26* 23 17  CREATININE 1.89* 1.74* 1.70* 1.48* 1.19*  CALCIUM 9.1 9.0 9.2 9.1 8.9   GFR: Estimated Creatinine Clearance: 54.2 mL/min (A) (by C-G formula based on SCr of 1.19 mg/dL (H)). Liver Function Tests: Recent Labs  Lab 11/01/19 1622  AST 31  ALT 20  ALKPHOS 144*  BILITOT 1.5*  PROT 6.6  ALBUMIN 3.3*   No results for input(s): LIPASE, AMYLASE in the last 168 hours. Recent Labs  Lab 11/01/19 2318  AMMONIA 38*   Coagulation Profile: Recent Labs  Lab 11/01/19 1622  INR 1.7*   Cardiac Enzymes: No results for input(s): CKTOTAL, CKMB, CKMBINDEX, TROPONINI in the last 168 hours. BNP (last 3 results) No results for input(s): PROBNP in the last 8760 hours. HbA1C: No results for input(s): HGBA1C in the last 72 hours. CBG: Recent Labs  Lab 11/01/19 1553 11/02/19 0150  GLUCAP 79 76   Lipid Profile: No results for input(s):  CHOL, HDL, LDLCALC, TRIG, CHOLHDL, LDLDIRECT in the last 72 hours. Thyroid Function Tests: No results for input(s): TSH, T4TOTAL, FREET4, T3FREE, THYROIDAB in the last 72 hours. Anemia Panel: No results for input(s): VITAMINB12, FOLATE, FERRITIN, TIBC, IRON, RETICCTPCT in the last 72 hours. Sepsis Labs: Recent Labs  Lab 11/01/19 1622 11/01/19 1700  LATICACIDVEN 1.5 1.6    Recent Results (from the past 240 hour(s))  SARS CORONAVIRUS 2 (TAT 6-24 HRS) Nasopharyngeal Nasopharyngeal Swab     Status: None   Collection Time: 10/25/19 12:42 PM   Specimen: Nasopharyngeal Swab  Result Value Ref Range Status   SARS Coronavirus 2 NEGATIVE NEGATIVE Final    Comment: (NOTE) SARS-CoV-2 target nucleic acids are NOT DETECTED. The SARS-CoV-2 RNA is generally detectable in upper and lower respiratory specimens during the acute phase of infection. Negative results do not preclude SARS-CoV-2 infection, do not rule out co-infections with other pathogens, and should not be used as the sole basis for treatment or other patient management  decisions. Negative results must be combined with clinical observations, patient history, and epidemiological information. The expected result is Negative. Fact Sheet for Patients: SugarRoll.be Fact Sheet for Healthcare Providers: https://www.woods-mathews.com/ This test is not yet approved or cleared by the [place] FDA and  has been authorized for detection and/or diagnosis of SARS-CoV-2 by FDA under an Emergency Use Authorization (EUA). This EUA will remain  in effect (meaning this test can be used) for the duration of the COVID-19 declaration under Section 56 4(b)(1) of the Act, 21 U.S.C. section 360bbb-3(b)(1), unless the authorization is terminated or revoked sooner. Performed at [facility], [place] [address], [place], [place] [address]   Respiratory Panel by RT PCR (Flu A&B, Covid) - Nasopharyngeal Swab     Status: None   Collection Time: 11/01/19  4:23 PM   Specimen: Nasopharyngeal Swab  Result Value Ref Range Status   SARS Coronavirus 2 by RT PCR NEGATIVE NEGATIVE Final    Comment: (NOTE) SARS-CoV-2 target nucleic acids are NOT DETECTED. The SARS-CoV-2 RNA is generally detectable in upper respiratoy specimens during the acute phase of infection. The lowest concentration of SARS-CoV-2 viral copies this assay can detect is 131 copies/mL. A negative result does not preclude SARS-Cov-2 infection and should not be used as the sole basis for treatment or other patient management decisions. A negative result may occur with  improper specimen collection/handling, submission of specimen other than nasopharyngeal swab, presence of viral mutation(s) within the areas targeted by this assay, and inadequate number of viral copies (<131 copies/mL). A negative result must be combined with clinical observations, patient history, and epidemiological information. The expected result is Negative. Fact Sheet for Patients:   PinkCheek.be Fact Sheet for Healthcare Providers:  GravelBags.it This test is not yet ap proved or cleared by the [place] FDA and  has been authorized for detection and/or diagnosis of SARS-CoV-2 by FDA under an Emergency Use Authorization (EUA). This EUA will remain  in effect (meaning this test can be used) for the duration of the COVID-19 declaration under Section 564(b)(1) of the Act, 21 U.S.C. section 360bbb-3(b)(1), unless the authorization is terminated or revoked sooner.    Influenza A by PCR NEGATIVE NEGATIVE Final   Influenza B by PCR NEGATIVE NEGATIVE Final    Comment: (NOTE) The Xpert Xpress SARS-CoV-2/FLU/RSV assay is intended as an aid in  the diagnosis of influenza from Nasopharyngeal swab specimens and  should not be used as a sole basis for treatment. Nasal washings and  aspirates are unacceptable for Xpert Xpress SARS-CoV-2/FLU/RSV  testing. Fact Sheet for Patients: PinkCheek.be Fact Sheet for Healthcare Providers: GravelBags.it This test is not yet approved or cleared by the [place] FDA and  has been authorized for detection and/or diagnosis of SARS-CoV-2 by  FDA under an Emergency Use Authorization (EUA). This EUA will remain  in effect (meaning this test can be used) for the duration of the  Covid-19 declaration under Section 564(b)(1) of the Act, 21  U.S.C. section 360bbb-3(b)(1), unless the authorization is  terminated or revoked. Performed at [facility], [place] [address] [place], [place] [address]   Culture, blood (routine x 2)     Status: None (Preliminary result)   Collection Time: 11/01/19  4:25 PM   Specimen: BLOOD RIGHT FOREARM  Result Value Ref Range Status   Specimen Description BLOOD RIGHT FOREARM  Final   Special Requests   Final    BOTTLES DRAWN AEROBIC AND ANAEROBIC Blood Culture adequate volume   Culture    Final    NO GROWTH 2 DAYS Performed at [facility], [place] [address], [place], [place] [address]    Report Status PENDING  Incomplete  Culture, blood (routine x 2)     Status: None (Preliminary result)   Collection Time: 11/01/19  5:00 PM   Specimen: BLOOD RIGHT HAND  Result Value Ref Range Status   Specimen Description BLOOD RIGHT HAND  Final   Special Requests AEROBIC BOTTLE ONLY Blood Culture adequate volume  Final   Culture   Final    NO GROWTH 2 DAYS Performed at [facility], [place] [address] [place], [place] [address]    Report Status PENDING  Incomplete         Radiology Studies: No results found.      Scheduled Meds: . allopurinol  300 mg Oral Daily  . amitriptyline  100 mg Oral QHS  . apixaban  5 mg Oral BID  . DULoxetine  60 mg Oral QHS  . ferrous sulfate  325 mg Oral QHS  . furosemide  80 mg Intravenous BID  . guaiFENesin  1,200 mg Oral BID  . levothyroxine  200 mcg Oral Daily  . polyethylene glycol  17 g Oral Daily  . senna-docusate  1 tablet Oral BID   Continuous Infusions:   LOS: 3 days    Time spent: 35 mins.More than 50% of that time was spent in counseling and/or coordination of care.      Shelly Coss, MD [facility] P2/14/2021, 11:35 AM

## 2019-11-04 NOTE — Progress Notes (Signed)
Physical Therapy Treatment Patient Details Name: [name] [name] MRN: [id number] DOB: 09/17/1946 Today's Date: 11/04/2019    History of Present Illness The pt is a 74 yo female presenting with AMS and hypoxia, found to have acute CHF exacerbation. Pt PMH includes: CKD III, COPD, a-fib, anemia, gout, and recent hernia surgery with d/c 2/9.    PT Comments    Pt making slow and steady progress towards physical therapy goals. Pt eager to go home and stating she is close to functional baseline; no family present to confirm. Pt requiring grossly min guard assist for all mobility. Ambulating 30 feet with a walker at a min guard assist level. SpO2 90% on 3L O2, HR 95-117 bpm. Presents with generalized weakness and decreased cardiovascular endurance. D/c plan remains appropriate if pt daughter able to provide increased level of assist.     Follow Up Recommendations  Home health PT;Supervision/Assistance - 24 hour     Equipment Recommendations  None recommended by PT    Recommendations for Other Services       Precautions / Restrictions Precautions Precautions: Fall Restrictions Weight Bearing Restrictions: No    Mobility  Bed Mobility Overal bed mobility: Needs Assistance Bed Mobility: Supine to Sit   Sidelying to sit: Min guard       General bed mobility comments: Cues for reaching with RUE for bed rail, min guard for safety  Transfers Overall transfer level: Needs assistance Equipment used: Rolling walker (2 wheeled) Transfers: Sit to/from [facility] Sit to Stand: Min guard Stand pivot transfers: Min guard          Ambulation/Gait Ambulation/Gait assistance: Min guard Gait Distance (Feet): 30 Feet Assistive device: Rolling walker (2 wheeled) Gait Pattern/deviations: Shuffle;Step-through pattern;Decreased stride length;Trunk flexed Gait velocity: decreased Gait velocity interpretation: <1.8 ft/sec, indicate of risk for recurrent falls General Gait  Details: Slow pace, increased trunk flexion, min guard for stability but no overt LOB   Stairs             Wheelchair Mobility    Modified Rankin (Stroke Patients Only)       Balance Overall balance assessment: Needs assistance Sitting-balance support: Feet supported Sitting balance-Leahy Scale: Fair     Standing balance support: Bilateral upper extremity supported;During functional activity Standing balance-Leahy Scale: Poor Standing balance comment: reliant on BUE support                            Cognition Arousal/Alertness: Awake/alert Behavior During Therapy: WFL for tasks assessed/performed Overall Cognitive Status: Within Functional Limits for tasks assessed                                 General Comments: A&Ox4, no formal assessment      Exercises      General Comments        Pertinent Vitals/Pain Pain Assessment: No/denies pain    Home Living                      Prior Function            PT Goals (current goals can now be found in the care plan section) Acute Rehab PT Goals Patient Stated Goal: return home PT Goal Formulation: With patient Time For Goal Achievement: 11/16/19 Progress towards PT goals: Progressing toward goals    Frequency    Min 3X/week  PT Plan Current plan remains appropriate    Co-evaluation              AM-PAC PT "6 Clicks" Mobility   Outcome Measure  Help needed turning from your back to your side while in a flat bed without using bedrails?: None Help needed moving from lying on your back to sitting on the side of a flat bed without using bedrails?: A Little Help needed moving to and from a bed to a chair (including a wheelchair)?: A Little Help needed standing up from a chair using your arms (e.g., wheelchair or bedside chair)?: A Little Help needed to walk in hospital room?: A Little Help needed climbing 3-5 steps with a railing? : A Lot 6 Click Score:  18    End of Session Equipment Utilized During Treatment: Gait belt;Oxygen Activity Tolerance: Patient tolerated treatment well Patient left: in chair;with call bell/phone within reach Nurse Communication: Mobility status PT Visit Diagnosis: Difficulty in walking, not elsewhere classified (R26.2)     Time: 9292-4462 PT Time Calculation (min) (ACUTE ONLY): 25 min  Charges:  $Therapeutic Activity: 23-37 mins                       Wyona Almas, PT, DPT Acute Rehabilitation Services Pager [phone] Office [phone]    Deno Etienne 11/04/2019, 12:58 PM

## 2019-11-04 NOTE — Progress Notes (Signed)
Patient ID: [name] [name], female   DOB: 11-09-1945, 74 y.o.   MRN: [id number]     Subjective: Ate breakfast Sore at hernia repair when she coughs Less SOB  ROS negative except as listed above. Objective: Vital signs in last 24 hours: Temp:  [97.7 F (36.5 C)-98 F (36.7 C)] 98 F (36.7 C) (02/14 0541) Pulse Rate:  [85-108] 85 (02/14 0541) Resp:  [16-21] 18 (02/14 0541) BP: (109-112)/(62-72) 109/62 (02/14 0541) SpO2:  [95 %-100 %] 100 % (02/14 0541) Weight:  [128.5 kg] 128.5 kg (02/14 0541) Last BM Date: 10/31/19  Intake/Output from previous day: 02/13 0701 - 02/14 0700 In: 320 [P.O.:320] Out: 5200 [Urine:5200] Intake/Output this shift: No intake/output data recorded.  General appearance: alert and cooperative Resp: few rales GI: soft, incision CDI  Lab Results: CBC  Recent Labs    11/01/19 1622 11/03/19 0441  WBC 7.4 7.7  HGB 10.8* 12.0  HCT 38.1 40.7  PLT 188 179   BMET Recent Labs    11/03/19 0441 11/04/19 0423  NA 146* 144  K 3.8 3.2*  CL 99 94*  CO2 34* 41*  GLUCOSE 76 91  BUN 23 17  CREATININE 1.48* 1.19*  CALCIUM 9.1 8.9   PT/INR Recent Labs    11/01/19 1622  LABPROT 19.7*  INR 1.7*   ABG Recent Labs    11/02/19 0313 11/02/19 1248  PHART 7.286* 7.372  HCO3 34.0* 34.3*    Studies/Results: No results found.  Anti-infectives: Anti-infectives (From admission, onward)   None      Assessment/Plan: SURGERY 10/29/2019   Pre-op Diagnosis: EPIGASTRIC HERNIA     Post-op Diagnosis: same  Procedure(s): EPIGASTRIC HERNIA REPAIR WITH MESH  (6.4 cm round prolene patch from [facility])  Surgeon(s): Coralie Keens, MD  Plan: appreciate medial management. Tolerating diet. OK to go home from surgery standpoint once medically ready.   LOS: 3 days    Georganna Skeans, MD, MPH, FACS Trauma & General Surgery Use [email or web address] to contact on call provider  11/04/2019

## 2019-11-04 NOTE — Progress Notes (Signed)
VASCULAR LAB PRELIMINARY  PRELIMINARY  PRELIMINARY  PRELIMINARY  Left upper extremity venous completed.    Preliminary report:  See CV proc for preliminary results.   Sarea Fyfe, RVT 11/04/2019, 4:28 PM

## 2019-11-04 NOTE — Progress Notes (Signed)
Occupational Therapy Evaluation Patient Details Name: [name] [name] MRN: [id number] DOB: 09-01-1946 Today's Date: 11/04/2019    History of Present Illness The pt is a 74 yo female presenting with AMS and hypoxia, found to have acute CHF exacerbation. Pt PMH includes: CKD III, COPD, a-fib, anemia, gout, and recent hernia surgery with d/c 2/9.   Clinical Impression   PTA, pt lived alone and was modified independent with ADL and mobility. Pt's daughter assists every evening with ADL and IADL as needed. Pt requires min A with LB ADL and min guard A with mobility @ RW level on 2L with SpO2 remianing above 90. 3/4 DOE. Pt states she is feeling better but that her "breathing is not back to normal yet". Recommend HHOT and [place] at DC. Will follow acutely to facilitate safe DC home.     Follow Up Recommendations  Home health OT;Supervision - Intermittent    Equipment Recommendations  Other (comment)(AE)    Recommendations for Other Services       Precautions / Restrictions Precautions Precautions: Fall Precaution Comments: 2L O2 at baseline      Mobility Bed Mobility               General bed mobility comments: OOB in chair  Transfers Overall transfer level: Needs assistance Equipment used: Rolling walker (2 wheeled) Transfers: Sit to/from [facility] Sit to Stand: Supervision Stand pivot transfers: Min guard            Balance     Sitting balance-Leahy Scale: Good       Standing balance-Leahy Scale: Fair Standing balance comment: able to stand at sink and complete pericare wihtout external support                           ADL either performed or assessed with clinical judgement   ADL Overall ADL's : Needs assistance/impaired     Grooming: Set up;Sitting   Upper Body Bathing: Set up;Sitting   Lower Body Bathing: Minimal assistance;Sit to/from stand   Upper Body Dressing : Set up;Sitting   Lower Body Dressing: Minimal  assistance;Sit to/from stand Lower Body Dressing Details (indicate cue type and reason): daughter usually helps with socks Toilet Transfer: Min guard;Stand-pivot;BSC;RW   Toileting- Clothing Manipulation and Hygiene: Minimal assistance Toileting - Clothing Manipulation Details (indicate cue type and reason): Pt ablet o clean but has difficulty reaching and cleaning thoroughly due to body habitus; would benefit from toilet tong     Functional mobility during ADLs: Min guard;Rolling walker General ADL Comments: Educated pt on availability of AE for ADL - written information provided to order on [place] pt verbalized understanding.      Vision         Perception     Praxis      Pertinent Vitals/Pain Pain Assessment: No/denies pain     Hand Dominance Right   Extremity/Trunk Assessment Upper Extremity Assessment Upper Extremity Assessment: Generalized weakness   Lower Extremity Assessment Lower Extremity Assessment: Defer to PT evaluation   Cervical / Trunk Assessment Cervical / Trunk Assessment: Normal   Communication Communication Communication: No difficulties   Cognition Arousal/Alertness: Awake/alert Behavior During Therapy: WFL for tasks assessed/performed Overall Cognitive Status: Within Functional Limits for tasks assessed                                 General Comments: Most likely close  to baseline. Pt states "I'm just slower than normal"   General Comments       Exercises     Shoulder Instructions      Home Living Family/patient expects to be discharged to:: Private residence Living Arrangements: Alone Available Help at Discharge: Family;Available PRN/intermittently Type of Home: House Home Access: Stairs to enter [employer] of Steps: 2 Entrance Stairs-Rails: Right Home Layout: One level Alternate Level Stairs-Number of Steps: 1 more step in laundry room, then all 1 level   Bathroom Shower/Tub: Animal nutritionist: Standard Bathroom Accessibility: Yes How Accessible: Accessible via walker Home Equipment: [place] - 2 wheels;Walker - 4 wheels;Cane - quad;Cane - single point;Bedside commode;Shower seat;Grab bars - tub/shower;Hand held shower head   Additional Comments: 2L O2 at home      Prior Functioning/Environment Level of Independence: Independent with assistive device(s)        Comments: pt says she is mostly independent at baseline (but that most things take extra time) with use of rollator. Daughter drives and assists with errands        OT Problem List: Decreased strength;Decreased activity tolerance;Decreased safety awareness;Decreased knowledge of use of DME or AE;Cardiopulmonary status limiting activity;Obesity      OT Treatment/Interventions: Self-care/ADL training;Therapeutic exercise;DME and/or AE instruction;Therapeutic activities;Patient/family education;Balance training    OT Goals(Current goals can be found in the care plan section) Acute Rehab OT Goals Patient Stated Goal: return home OT Goal Formulation: With patient Time For Goal Achievement: 11/18/19 Potential to Achieve Goals: Good  OT Frequency: Min 3X/week   Barriers to D/C:            Co-evaluation              AM-PAC OT "6 Clicks" Daily Activity     Outcome Measure Help from another person eating meals?: None Help from another person taking care of personal grooming?: A Little Help from another person toileting, which includes using toliet, bedpan, or urinal?: A Little Help from another person bathing (including washing, rinsing, drying)?: A Little Help from another person to put on and taking off regular upper body clothing?: A Little Help from another person to put on and taking off regular lower body clothing?: A Little 6 Click Score: 19   End of Session Equipment Utilized During Treatment: Gait belt;Rolling walker;Oxygen(2L) Nurse Communication: Mobility status  Activity  Tolerance: Patient tolerated treatment well Patient left: in chair;with call bell/phone within reach  OT Visit Diagnosis: Unsteadiness on feet (R26.81);Muscle weakness (generalized) (M62.81)                Time: 1550-1620 OT Time Calculation (min): 30 min Charges:  OT General Charges $OT Visit: 1 Visit OT Evaluation $OT Eval Moderate Complexity: 1 Mod OT Treatments $Self Care/Home Management : 8-22 mins  Maurie Boettcher, OT/L   Acute OT Clinical Specialist [place] Pager [phone] Office [phone]   [facility] 11/04/2019, 5:17 PM

## 2019-11-05 ENCOUNTER — Ambulatory Visit (INDEPENDENT_AMBULATORY_CARE_PROVIDER_SITE_OTHER): Payer: Medicare HMO | Admitting: *Deleted

## 2019-11-05 DIAGNOSIS — I480 Paroxysmal atrial fibrillation: Secondary | ICD-10-CM

## 2019-11-05 LAB — BASIC METABOLIC PANEL
Anion gap: 12 (ref 5–15)
BUN: 13 mg/dL (ref 8–23)
CO2: 45 mmol/L — ABNORMAL HIGH (ref 22–32)
Calcium: 8.9 mg/dL (ref 8.9–10.3)
Chloride: 90 mmol/L — ABNORMAL LOW (ref 98–111)
Creatinine, Ser: 1.08 mg/dL — ABNORMAL HIGH (ref 0.44–1.00)
GFR calc Af Amer: 59 mL/min — ABNORMAL LOW (ref >60)
GFR calc non Af Amer: 51 mL/min — ABNORMAL LOW (ref >60)
Glucose, Bld: 79 mg/dL (ref 70–99)
Potassium: 3.7 mmol/L (ref 3.5–5.1)
Sodium: 147 mmol/L — ABNORMAL HIGH (ref 135–145)

## 2019-11-05 MED ORDER — FUROSEMIDE 80 MG PO TABS
80.0000 mg | ORAL_TABLET | Freq: Two times a day (BID) | ORAL | Status: DC
  Administered 2019-11-06: 80 mg via ORAL
  Filled 2019-11-05: qty 1

## 2019-11-05 MED ORDER — FUROSEMIDE 10 MG/ML IJ SOLN
80.0000 mg | Freq: Two times a day (BID) | INTRAMUSCULAR | Status: AC
  Administered 2019-11-05: 18:00:00 80 mg via INTRAVENOUS
  Filled 2019-11-05: qty 8

## 2019-11-05 NOTE — Progress Notes (Signed)
Physical Therapy Treatment Patient Details Name: [name] [name] MRN: [id number] DOB: Jan 22, 1946 Today's Date: 11/05/2019    History of Present Illness The pt is a 74 yo female presenting with AMS and hypoxia, found to have acute CHF exacerbation. Pt PMH includes: CKD III, COPD, a-fib, anemia, gout, and recent hernia surgery with d/c 2/9.    PT Comments    Re-assessment performed per MD request today. Patient up in chair, reluctant to participate in PT today due to general malaise but able to be convinced to participate in bathroom activities. Able to walk to/from bathroom with RW and S-min guard, seems steady enough when using device at her own pace. Did not really need physical assist during session, more assist in line management. Able to toilet and perform pericare with distant S. Gait trained perhaps 36f total in her room, declined out to hall today due to fatigue. She reports she is at her functional baseline, family still not here to confirm. Continue to feel that she should be able to return home as long as family can provide increased levels of assist.    Follow Up Recommendations  Home health PT;Supervision/Assistance - 24 hour     Equipment Recommendations  None recommended by PT    Recommendations for Other Services       Precautions / Restrictions Precautions Precautions: Fall Precaution Comments: 2L O2 at baseline Restrictions Weight Bearing Restrictions: No    Mobility  Bed Mobility               General bed mobility comments: OOB in chair  Transfers Overall transfer level: Needs assistance Equipment used: Rolling walker (2 wheeled) Transfers: Sit to/from Stand Sit to Stand: Supervision         General transfer comment: S for safety, no physical assist given  Ambulation/Gait Ambulation/Gait assistance: Min guard Gait Distance (Feet): 30 Feet(in room, declined out to hall due to fatigue) Assistive device: Rolling walker (2 wheeled) Gait  Pattern/deviations: Step-through pattern;Decreased stride length;Trunk flexed;Decreased step length - right;Decreased step length - left;Decreased dorsiflexion - right;Decreased dorsiflexion - left Gait velocity: decreased   General Gait Details: Slow pace, increased trunk flexion, min guard for stability but no overt LOB. Seems mostly steady withRW, did give closer min guard during tight turns in the bathroom   Stairs             Wheelchair Mobility    Modified Rankin (Stroke Patients Only)       Balance Overall balance assessment: Needs assistance Sitting-balance support: Feet supported Sitting balance-Leahy Scale: Good     Standing balance support: Bilateral upper extremity supported;During functional activity Standing balance-Leahy Scale: Fair Standing balance comment: able to stand at sink and complete pericare wihtout external support                            Cognition Arousal/Alertness: Awake/alert(Simultaneous filing. User may not have seen previous data.) Behavior During Therapy: [facility] tasks assessed/performed(Simultaneous filing. User may not have seen previous data.) Overall Cognitive Status: Within Functional Limits for tasks assessed(Simultaneous filing. User may not have seen previous data.)                                 General Comments: Most likely close to baseline, some STM deficits- asked if the cups on her table were hers      Exercises  General Comments        Pertinent Vitals/Pain Pain Assessment: Faces(Simultaneous filing. User may not have seen previous data.) Faces Pain Scale: Hurts a little bit Pain Descriptors / Indicators: Aching;Grimacing;Guarding Pain Intervention(s): Limited activity within patient's tolerance;Monitored during session;Repositioned;Patient requesting pain meds-RN notified    Home Living                      Prior Function            PT Goals (current goals can now be  found in the care plan section) Acute Rehab PT Goals Patient Stated Goal: return home PT Goal Formulation: With patient Time For Goal Achievement: 11/16/19 Progress towards PT goals: Progressing toward goals    Frequency    Min 3X/week      PT Plan Current plan remains appropriate    Co-evaluation              AM-PAC PT "6 Clicks" Mobility   Outcome Measure  Help needed turning from your back to your side while in a flat bed without using bedrails?: None Help needed moving from lying on your back to sitting on the side of a flat bed without using bedrails?: A Little Help needed moving to and from a bed to a chair (including a wheelchair)?: A Little Help needed standing up from a chair using your arms (e.g., wheelchair or bedside chair)?: A Little Help needed to walk in hospital room?: A Little Help needed climbing 3-5 steps with a railing? : A Lot 6 Click Score: 18    End of Session Equipment Utilized During Treatment: Oxygen Activity Tolerance: Patient tolerated treatment well Patient left: in chair;with call bell/phone within reach   PT Visit Diagnosis: Difficulty in walking, not elsewhere classified (R26.2)     Time: 7517-0017 PT Time Calculation (min) (ACUTE ONLY): 25 min  Charges:  $Gait Training: 8-22 mins $Therapeutic Activity: 8-22 mins                     Windell Norfolk, DPT, PN1   Supplemental Physical Therapist [place]    Pager [phone] Acute Rehab Office [phone]

## 2019-11-05 NOTE — Progress Notes (Signed)
RT set up Dreamstation CPAP and placed on patient with 3L O2 bled into circuit. Patient tolerating well at this time. RT will monitor as needed.

## 2019-11-05 NOTE — Plan of Care (Signed)

## 2019-11-05 NOTE — Progress Notes (Signed)
Occupational Therapy Treatment Patient Details Name: [name] [name] MRN: [id number] DOB: 1946-08-03 Today's Date: 11/05/2019    History of present illness The pt is a 74 yo female presenting with AMS and hypoxia, found to have acute CHF exacerbation. Pt PMH includes: CKD III, COPD, a-fib, anemia, gout, and recent hernia surgery with d/c 2/9.   OT comments  Patient continues to make steady progress towards goals in skilled OT session. Patient's session encompassed education on adaptive equipment, and transfers to the chair to eat OOB. Pt willing to learn AE, and required min verbal cues for sequencing to appropriately demonstrate once educated. Pt would benefit from continued practice while in acute hospital stay. Pt able to transfer with min A to chair to eat, however demonstrated one LOB while standing, however was able to correct with use of gait belt and rolling walker. Pt would continue to benefit from skilled services in regards to AE, energy conservation, and functional mobility; will continue to follow acutely.    Follow Up Recommendations  Home health OT;Supervision - Intermittent    Equipment Recommendations  Other (comment)(Adaptive equipment)    Recommendations for Other Services      Precautions / Restrictions Precautions Precautions: Fall Precaution Comments: 2L O2 at baseline Restrictions Weight Bearing Restrictions: No       Mobility Bed Mobility Overal bed mobility: Needs Assistance Bed Mobility: Supine to Sit   Sidelying to sit: Min assist       General bed mobility comments: Required min A in order to complete sidelying to sitting, increased respirations, however O2 remained steady  Transfers Overall transfer level: Needs assistance Equipment used: Rolling walker (2 wheeled) Transfers: Sit to/from Stand Sit to Stand: Min assist         General transfer comment: Required min A to power up from bed, minor LOB when ambulating to chair, however was able  to correct    Balance Overall balance assessment: Needs assistance Sitting-balance support: Feet supported Sitting balance-Leahy Scale: Good     Standing balance support: Bilateral upper extremity supported;During functional activity Standing balance-Leahy Scale: Poor Standing balance comment: minor LOB when ambulating few steps to chair, however corrected quickly                           ADL either performed or assessed with clinical judgement   ADL Overall ADL's : Needs assistance/impaired Eating/Feeding: Set up             Lower Body Bathing Details (indicate cue type and reason): Education on AE     Lower Body Dressing: Minimal assistance;Cueing for sequencing;With adaptive equipment Lower Body Dressing Details (indicate cue type and reason): Completed in sitting, use of reacher and sock aid Toilet Transfer: Minimal assistance;Stand-pivot;RW;Cueing for sequencing Toilet Transfer Details (indicate cue type and reason): Simulated via transfer to the chair   Toileting - Clothing Manipulation Details (indicate cue type and reason): Educated on toilet tongs     Functional mobility during ADLs: Minimal assistance;Rolling walker General ADL Comments: Educated and demonstrated AE for ADLs     Vision       Perception     Praxis      Cognition Arousal/Alertness: Awake/alert(Simultaneous filing. User may not have seen previous data.) Behavior During Therapy: [facility] for tasks assessed/performed(Simultaneous filing. User may not have seen previous data.) Overall Cognitive Status: Within Functional Limits for tasks assessed(Simultaneous filing. User may not have seen previous data.)  General Comments: Most likely close to baseline, some STM deficits- asked if the cups on her table were hers        Exercises     Shoulder Instructions       General Comments      Pertinent Vitals/ Pain       Pain Assessment:  Faces(Simultaneous filing. User may not have seen previous data.) Faces Pain Scale: Hurts a little bit Pain Descriptors / Indicators: Aching;Grimacing;Guarding Pain Intervention(s): Limited activity within patient's tolerance;Monitored during session;Repositioned;Patient requesting pain meds-RN notified  Home Living                                          Prior Functioning/Environment              Frequency  Min 3X/week        Progress Toward Goals  OT Goals(current goals can now be found in the care plan section)  Progress towards OT goals: Progressing toward goals  Acute Rehab OT Goals Patient Stated Goal: return home OT Goal Formulation: With patient Time For Goal Achievement: 11/18/19 Potential to Achieve Goals: Good  Plan Discharge plan remains appropriate    Co-evaluation                 AM-PAC OT "6 Clicks" Daily Activity     Outcome Measure   Help from another person eating meals?: None Help from another person taking care of personal grooming?: A Little Help from another person toileting, which includes using toliet, bedpan, or urinal?: A Little Help from another person bathing (including washing, rinsing, drying)?: A Little Help from another person to put on and taking off regular upper body clothing?: A Little Help from another person to put on and taking off regular lower body clothing?: A Little 6 Click Score: 19    End of Session Equipment Utilized During Treatment: Gait belt;Rolling walker;Oxygen  OT Visit Diagnosis: Unsteadiness on feet (R26.81);Muscle weakness (generalized) (M62.81)   Activity Tolerance Patient tolerated treatment well   Patient Left in chair;with call bell/phone within reach   Nurse Communication Mobility status;Patient requests pain meds        Time: 1204-1226 OT Time Calculation (min): 22 min  Charges: OT General Charges $OT Visit: 1 Visit OT Treatments $Self Care/Home Management : 8-22  mins  Corinne Ports E. Tereso Unangst, COTA/L Acute Rehabilitation Services [phone] [place] 11/05/2019, 2:05 PM

## 2019-11-05 NOTE — Progress Notes (Signed)
PROGRESS NOTE    [name] [name]  LKT:[id number] DOB: 08/01/1946 DOA: 11/01/2019 PCP: Biagio Borg, MD   Brief Narrative: Patient is a 74 year old female with history of COPD, chronic diastolic CHF, sleep apnea, paroxysmal A. fib, chronic anemia, CKD stage III with baseline creatinine of 1.3, sick sinus syndrome status post sick sinus syndrome, hypothyroidism, status post ventral hernia surgery about 5 days ago before admission date and was discharged the following day became lethargic, had decreased oral intake and was brought to the emergency department.  On presentation, she was found to have AKI.  CT imaging of abdomen and pelvis showed fluid overload, features of IBS.  She was found to be  Lethargic and confused .  General surgery requested for our consultation and we are assuming care.  She was admitted for the management of acute on chronic respiratory failure with hypoxia and hypercarbia due to congestive heart failure.  She had to be put on BiPAP.  Respiratory status improving and she is on nasal cannula.  She needs to be continued on IV Lasix because she still looks volume overloaded. Plan for discharge tomorrow to home with home health.  Assessment & Plan:   Principal Problem:   Acute on chronic diastolic CHF (congestive heart failure) (HCC) Active Problems:   Hypothyroidism, acquired   COPD GOLD III   Atrial fibrillation (HCC)   Chronic respiratory failure (HCC)   Ileus (HCC)   Acute encephalopathy    Acute on chronic respiratory failure with hypoxia/hypercarbia: Presented with shortness of breath, altered mental status from hypercarbia.  ABG showed CO2 in the range of 70s.  Put on BiPAP. She is on oxygen at 3 L/min at home. Respiratory status has improved.  Currently she is on 3 L of oxygen per minute maintaining saturation.  Acute on chronic diastolic CHF: EF as per last echo was 55 to 60% with grade 1 diastolic dysfunction.  Patient had significant edema with features of  anasarca.  Started on Lasix 80 mg IV every 12.  BNP elevated. Monitor daily weight, input/output.She takes Lasix 60 mg twice a day at home.  She follows with cardiology. She had net output of more than 10L since admission.  Right upper extremity edema: Most likely associated  with IV infiltration.  Venous Doppler negative for  DVT  Acute metabolic encephalopathy:Most likely secondary to hypercarbia.  ABG had shown hypercarbia.CT head was unremarkable.  Ammonia  level normal. Currently she is alert and oriented.  AKI on CKD stage IIIb: Presented with volume overload.  Likley cardiorenal syndrome.Baseline creatinine around 1.6.  Kidney function at baseline now.  Continue Lasix.  Monitor BMP  COPD: Not wheezing at this time.  Has hypercarbia.  Continue BiPAP.  She follows with pulmonology.  She is on 3 L of oxygen per minute at home.  Paroxysmal A. fib: Currently normal sinus rhythm.  On metoprolol and Eliquis  History of sick sinus syndrome: Status post pacemaker  Chronic normocytic anemia: Could be associated  with chronic renal disease.  Continue to monitor.  H&H stable  Sleep apnea: On cpap at night at home  History of gout: On allopurinol  Hyperlipidemia: On statin  Recent ventral hernia surgery: general surgery was following.  Denies any abdominal pain.  History of depression: On Cymbalta  Hypothyroidism: Continue Synthyroid         DVT prophylaxis:Eliquis Code Status: Full Family Communication: Daughter on phone on 11/05/2019 Disposition Plan: Patient is from home.  She is not clinically stable for  discharge today because she is still volume overloaded and needs IV Lasix.  Expect discharge to home tomorow .PT recommended home health PT.  Consultants: General surgery  Procedures: None  Antimicrobials:  Anti-infectives (From admission, onward)   None      Subjective:  Patient seen and examined at the bedside this morning.  Hemodynamically stable.  Has generalized  weakness.  Edema is overall improving but she still has significant bilateral lower extremity edema so we will continue Lasix for today and start on oral Lasix from tomorrow and discharge home tomorrow.  She is very eager to go home.  PT/OT recommend home health.  I discussed with daughter on phone today.  Objective: Vitals:   11/04/19 2046 11/05/19 0551 11/05/19 0800 11/05/19 1022  BP: 105/71  99/62 102/69  Pulse: 94 93 95 93  Resp: (!) 21  (!) 21 18  Temp: 98.2 F (36.8 C) 97.7 F (36.5 C)    TempSrc: Oral Oral    SpO2: 100% 99% 94% 100%  Weight:  126.2 kg      Intake/Output Summary (Last 24 hours) at 11/05/2019 1259 Last data filed at 11/05/2019 1025 Gross per 24 hour  Intake 717 ml  Output 3300 ml  Net -2583 ml   Filed Weights   11/03/19 0442 11/04/19 0541 11/05/19 0551  Weight: 134.3 kg 128.5 kg 126.2 kg    Examination:   General exam: Morbidly obese, not in distress HEENT:PERRL,Oral mucosa moist, Ear/Nose normal on gross exam Respiratory system: No crackles or wheezes Cardiovascular system: S1 & S2 heard, RRR. No JVD, murmurs, rubs, gallops or clicks. Gastrointestinal system: Abdomen is nondistended, soft and nontender. No organomegaly or masses felt. Normal bowel sounds heard.  Clean surgical scar on the abdomen Central nervous system: Alert and oriented. No focal neurological deficits. Extremities: 1-2+ bilateral lower extremity edema, upper extremity edema, no clubbing ,no cyanosis Skin: No rashes, lesions or ulcers,no icterus ,no pallor     Data Reviewed: I have personally reviewed following labs and imaging studies  CBC: Recent Labs  Lab 10/30/19 0750 11/01/19 1622 11/03/19 0441  WBC 9.3 7.4 7.7  NEUTROABS  --  5.9 5.7  HGB 10.5* 10.8* 12.0  HCT 37.4 38.1 40.7  MCV 104.5* 104.4* 99.0  PLT 136* 188 300   Basic Metabolic Panel: Recent Labs  Lab 11/01/19 2318 11/02/19 0514 11/03/19 0441 11/04/19 0423 11/05/19 0326  NA 142 144 146* 144 147*  K  4.8 4.3 3.8 3.2* 3.7  CL 103 102 99 94* 90*  CO2 29 30 34* 41* 45*  GLUCOSE 82 78 76 91 79  BUN 24* 26* 23 17 13   CREATININE 1.74* 1.70* 1.48* 1.19* 1.08*  CALCIUM 9.0 9.2 9.1 8.9 8.9   GFR: Estimated Creatinine Clearance: 59 mL/min (A) (by C-G formula based on SCr of 1.08 mg/dL (H)). Liver Function Tests: Recent Labs  Lab 11/01/19 1622  AST 31  ALT 20  ALKPHOS 144*  BILITOT 1.5*  PROT 6.6  ALBUMIN 3.3*   No results for input(s): LIPASE, AMYLASE in the last 168 hours. Recent Labs  Lab 11/01/19 2318  AMMONIA 38*   Coagulation Profile: Recent Labs  Lab 11/01/19 1622  INR 1.7*   Cardiac Enzymes: No results for input(s): CKTOTAL, CKMB, CKMBINDEX, TROPONINI in the last 168 hours. BNP (last 3 results) No results for input(s): PROBNP in the last 8760 hours. HbA1C: No results for input(s): HGBA1C in the last 72 hours. CBG: Recent Labs  Lab 11/01/19 1553 11/02/19 0150  GLUCAP  79 76   Lipid Profile: No results for input(s): CHOL, HDL, LDLCALC, TRIG, CHOLHDL, LDLDIRECT in the last 72 hours. Thyroid Function Tests: No results for input(s): TSH, T4TOTAL, FREET4, T3FREE, THYROIDAB in the last 72 hours. Anemia Panel: No results for input(s): VITAMINB12, FOLATE, FERRITIN, TIBC, IRON, RETICCTPCT in the last 72 hours. Sepsis Labs: Recent Labs  Lab 11/01/19 1622 11/01/19 1700  LATICACIDVEN 1.5 1.6    Recent Results (from the past 240 hour(s))  Respiratory Panel by RT PCR (Flu A&B, Covid) - Nasopharyngeal Swab     Status: None   Collection Time: 11/01/19  4:23 PM   Specimen: Nasopharyngeal Swab  Result Value Ref Range Status   SARS Coronavirus 2 by RT PCR NEGATIVE NEGATIVE Final    Comment: (NOTE) SARS-CoV-2 target nucleic acids are NOT DETECTED. The SARS-CoV-2 RNA is generally detectable in upper respiratoy specimens during the acute phase of infection. The lowest concentration of SARS-CoV-2 viral copies this assay can detect is 131 copies/mL. A negative result  does not preclude SARS-Cov-2 infection and should not be used as the sole basis for treatment or other patient management decisions. A negative result may occur with  improper specimen collection/handling, submission of specimen other than nasopharyngeal swab, presence of viral mutation(s) within the areas targeted by this assay, and inadequate number of viral copies (<131 copies/mL). A negative result must be combined with clinical observations, patient history, and epidemiological information. The expected result is Negative. Fact Sheet for Patients:  PinkCheek.be Fact Sheet for Healthcare Providers:  GravelBags.it This test is not yet ap proved or cleared by the [place] FDA and  has been authorized for detection and/or diagnosis of SARS-CoV-2 by FDA under an Emergency Use Authorization (EUA). This EUA will remain  in effect (meaning this test can be used) for the duration of the COVID-19 declaration under Section 564(b)(1) of the Act, 21 U.S.C. section 360bbb-3(b)(1), unless the authorization is terminated or revoked sooner.    Influenza A by PCR NEGATIVE NEGATIVE Final   Influenza B by PCR NEGATIVE NEGATIVE Final    Comment: (NOTE) The Xpert Xpress SARS-CoV-2/FLU/RSV assay is intended as an aid in  the diagnosis of influenza from Nasopharyngeal swab specimens and  should not be used as a sole basis for treatment. Nasal washings and  aspirates are unacceptable for Xpert Xpress SARS-CoV-2/FLU/RSV  testing. Fact Sheet for Patients: PinkCheek.be Fact Sheet for Healthcare Providers: GravelBags.it This test is not yet approved or cleared by the [place] FDA and  has been authorized for detection and/or diagnosis of SARS-CoV-2 by  FDA under an Emergency Use Authorization (EUA). This EUA will remain  in effect (meaning this test can be used) for the duration of  the  Covid-19 declaration under Section 564(b)(1) of the Act, 21  U.S.C. section 360bbb-3(b)(1), unless the authorization is  terminated or revoked. Performed at [facility], [place] [address], [place], [place] [address]   Culture, blood (routine x 2)     Status: None (Preliminary result)   Collection Time: 11/01/19  4:25 PM   Specimen: BLOOD RIGHT FOREARM  Result Value Ref Range Status   Specimen Description BLOOD RIGHT FOREARM  Final   Special Requests   Final    BOTTLES DRAWN AEROBIC AND ANAEROBIC Blood Culture adequate volume   Culture   Final    NO GROWTH 4 DAYS Performed at [facility], [place] [address] [place], [place] [address]    Report Status PENDING  Incomplete  Culture, blood (  routine x 2)     Status: None (Preliminary result)   Collection Time: 11/01/19  5:00 PM   Specimen: BLOOD RIGHT HAND  Result Value Ref Range Status   Specimen Description BLOOD RIGHT HAND  Final   Special Requests AEROBIC BOTTLE ONLY Blood Culture adequate volume  Final   Culture   Final    NO GROWTH 4 DAYS Performed at [facility], [address] [place], [place] [address]    Report Status PENDING  Incomplete         Radiology Studies: VAS [place] UPPER EXTREMITY VENOUS DUPLEX  Result Date: 11/04/2019 UPPER VENOUS STUDY  Indications: Edema, and IV infiltration Limitations: Edema. Comparison Study: No prior study on file for comparison Performing Technologist: Sharion Dove RVS  Examination Guidelines: A complete evaluation includes B-mode imaging, spectral Doppler, color Doppler, and power Doppler as needed of all accessible portions of each vessel. Bilateral testing is considered an integral part of a complete examination. Limited examinations for reoccurring indications may be performed as noted.  Left Findings: +----------+------------+---------+-----------+----------+---------------+ LEFT      CompressiblePhasicitySpontaneousProperties    Summary      +----------+------------+---------+-----------+----------+---------------+ IJV                      Yes       Yes                              +----------+------------+---------+-----------+----------+---------------+ Subclavian               Yes       Yes                              +----------+------------+---------+-----------+----------+---------------+ Axillary      Full       Yes       Yes                              +----------+------------+---------+-----------+----------+---------------+ Brachial      Full       Yes       Yes                              +----------+------------+---------+-----------+----------+---------------+ Radial        Full                                                  +----------+------------+---------+-----------+----------+---------------+ Ulnar                                               patent by color +----------+------------+---------+-----------+----------+---------------+ Basilic       Full                                                  +----------+------------+---------+-----------+----------+---------------+  Summary:  Left: No evidence of deep vein thrombosis in the upper extremity. No evidence of superficial  vein thrombosis in the upper extremity.  *See table(s) above for measurements and observations.    Preliminary         Scheduled Meds: . allopurinol  300 mg Oral Daily  . amitriptyline  100 mg Oral QHS  . apixaban  5 mg Oral BID  . DULoxetine  60 mg Oral QHS  . ferrous sulfate  325 mg Oral QHS  . furosemide  80 mg Intravenous BID  . guaiFENesin  1,200 mg Oral BID  . levothyroxine  200 mcg Oral Daily  . polyethylene glycol  17 g Oral Daily  . senna-docusate  1 tablet Oral BID   Continuous Infusions:   LOS: 4 days    Time spent: 35 mins.More than 50% of that time was spent in counseling and/or coordination of care.      Shelly Coss, MD [facility] P2/15/2021, 12:59 PM

## 2019-11-05 NOTE — Progress Notes (Signed)
Patient resting comfortably on 2L [place] with no respiratory distress noted. BIPAP not needed at this time. RT will monitor as needed.

## 2019-11-06 LAB — CUP PACEART REMOTE DEVICE CHECK
Date Time Interrogation Session: 20210216061525
Implantable Lead Implant Date: 20141212
Implantable Lead Implant Date: 20141212
Implantable Lead Location: 753859
Implantable Lead Location: 753860
Implantable Lead Model: 346
Implantable Lead Model: 350
Implantable Lead Serial Number: 29409274
Implantable Lead Serial Number: 29535412
Implantable Pulse Generator Implant Date: 20141212
Pulse Gen Serial Number: 68181424

## 2019-11-06 LAB — CULTURE, BLOOD (ROUTINE X 2)
Culture: NO GROWTH
Culture: NO GROWTH
Special Requests: ADEQUATE
Special Requests: ADEQUATE

## 2019-11-06 LAB — BASIC METABOLIC PANEL
Anion gap: 12 (ref 5–15)
BUN: 12 mg/dL (ref 8–23)
CO2: 44 mmol/L — ABNORMAL HIGH (ref 22–32)
Calcium: 8.9 mg/dL (ref 8.9–10.3)
Chloride: 86 mmol/L — ABNORMAL LOW (ref 98–111)
Creatinine, Ser: 1 mg/dL (ref 0.44–1.00)
GFR calc Af Amer: >60 mL/min (ref >60)
GFR calc non Af Amer: 56 mL/min — ABNORMAL LOW (ref >60)
Glucose, Bld: 79 mg/dL (ref 70–99)
Potassium: 3.4 mmol/L — ABNORMAL LOW (ref 3.5–5.1)
Sodium: 142 mmol/L (ref 135–145)

## 2019-11-06 MED ORDER — FUROSEMIDE 40 MG PO TABS: 60.0000 mg | ORAL_TABLET | Freq: Two times a day (BID) | ORAL | Status: DC

## 2019-11-06 MED ORDER — POTASSIUM CHLORIDE CRYS ER 20 MEQ PO TBCR
40.0000 meq | EXTENDED_RELEASE_TABLET | Freq: Once | ORAL | Status: AC
  Administered 2019-11-06: 09:00:00 40 meq via ORAL
  Filled 2019-11-06: qty 2

## 2019-11-06 MED ORDER — POTASSIUM CHLORIDE CRYS ER 20 MEQ PO TBCR: 20.0000 meq | EXTENDED_RELEASE_TABLET | Freq: Two times a day (BID) | ORAL | 1 refills | Status: AC

## 2019-11-06 MED ORDER — FUROSEMIDE 40 MG PO TABS: 40.0000 mg | ORAL_TABLET | Freq: Two times a day (BID) | ORAL | 3 refills | Status: AC

## 2019-11-06 NOTE — TOC Initial Note (Signed)
Transition of Care [facility]) - Initial/Assessment Note    Patient Details  Name: [name] [name] MRN: [id number] Date of Birth: 20-Jun-1946  Transition of [facility]) CM/SW Contact:    Curlene Labrum, RN Phone Number: 11/06/2019, 3:50 PM  Clinical Narrative:     Patient presented to [facility] with Acute on chronic diastolic CHF congestive heart failure.  Nursing Case Manager presented to the patient's room to offer her Medicare Choice for [place] preference for physician orders for PT, OT, CSW, and RN.  Patient appears frustrated and asked that we speak to her daughter, [name] [name], requesting her ability to go home.  Called the daughter, [name] [name], on the phone, and [name] [name] expressed that she was unable to provide 24 supervision to care for the patient outside of home health care visits.  [name] [name], daughter, works in [place] and is unable to take FMLA from work since she not the patient's health care power of attorney.  Patient states that her son, living in [place], is the patient's financial and Leonard and is unreachable by phone due to lack of contact from both the patient and the daughter, [name] [name].  Patient refuses to grant daughter, [name] [name], health care power of attorney rights at this time.  [name] [name], daughter, requesting that patient stay longer in the hospital, but patient refusing to stay in the hospital or go to nursing home rehab, where she will have 24 hour care and supervision.  Patient states she will have a neighbor pick her up and transport her home once the daughter arrives with her house key, portable oxygen, CPAP and cell phone.  Dr. Tawanna Solo notified of this information and patient's refusal of SNF placement and patient's unsupervised living situation.  Patient is alert and oriented times four and able to make clear decisions for herself regarding her discharge plan home today.  In the meantime, call made to Comprehensive inpatient Rehab and Heart Failure Team for  paramedicine care assistance and patient will not quality for either of these services.  Patient receives pharmacy medications from [facility] on [address]. And O2 services from [employer].  Patient states she has all needed DME needs at home.  Family is unable and unwilling to care for the patient at home.  Patient verbalizes that she does not have church members nor neighbors that can assist with her care.  Patient resistant and addiment that she return home to her own home and refuses SNF placement.  Called Dr. Leo Rod and notified of the patient's living and social situation and refusal of SNF placement. No new orders by physician noted and patient to be discharged home. Spoke with the primary care nurse on the floor and clarified the above patient care issues.  Patient presently waiting ride home with her neighbor.  The daughter arrived earlier and delivered the patient's O2 and house key. [facility] contacted and they are to provide PT, OT, RN and CSW. [facility] CSW to contact the son and explain the patient's current living and social situation and need for 24 hour supervision.   Expected Discharge Plan: [facility](patient refused SNF placement) Barriers to Discharge: Other (comment)(Patient requested neighbor to pick her up and tranport home with home O2.  Patient refused SNF placment and daughter is bringing keys to the patient's home, patient's cell phone, and O2 portable tank from home.  Patient refusing PTAR-)   Patient Goals and CMS Choice Patient states their goals for this  hospitalization and ongoing recovery are:: "I've been taking care of myself since I was 74 years old and I am perfectly capable of caring for all of my needs" CMS Medicare.gov Compare Post Acute Care list provided to:: Patient(Daughter, Anits, given Medicare [facility] Choice list as well.) Choice offered to / list presented to : Patient, Adult Children(Daughter refused to provide 24 hour  supervision because she is not healthcare POA)  Expected Discharge Plan and Services Expected Discharge Plan: [facility](patient refused SNF placement)   Discharge Planning Services: CM Consult Post Acute Care Choice: [place] arrangements for the past 2 months: Single Family Home Expected Discharge Date: 11/06/19               DME Arranged: N/A(Daughter, [name] [name], is bringing patient's portable O2 tank/[place] and CPAP to the hospital before discharge) DME Agency: NA       HH Arranged: PT, OT, RN, Social Work [employer] Agency: [facility] [facility] [employer] Date [place]: 11/06/19 Time [place]: 73 Representative spoke with at [place]: Jonelle Sidle, [facility]  Prior Living Arrangements/Services Living arrangements for the past 2 months: [place] with:: Self(Adult daughter, [name] [name], works in [place].  Patient's son, who is health and financial power of attorney - lives in [place], is unavailable by phone and patient and daughter unable to give son's contact information.  Son's contact info. not on chart.) Patient language and need for interpreter reviewed:: Yes Do you feel safe going back to the place where you live?: Yes(Patient alert and oriented times 4. [name] [name], daughter, states she is unable to provide 24 supervision for the patient, patient aware.  Daughter requests SNF and patient refuses.)      Need for Family Participation in Patient Care: Yes (Comment)(Daughter, [name] [name], verbalized inability to provide 24 hour supervision due to work duties and patient refuses SNF) Care giver support system in place?: No (comment)(Patient refuses SNF.  Neighbor to pick patient up for discharge home once daughter brings O2, cell phone and keys to home.  Son unavailable to contact.  Dr. Tawanna Solo made aware.) Current home services: DME Criminal Activity/Legal Involvement Pertinent to Current Situation/Hospitalization: No - Comment  as needed  Activities of Daily Living Home Assistive Devices/Equipment: CPAP, Walker (specify type) ADL Screening (condition at time of admission) Patient's cognitive ability adequate to safely complete daily activities?: Yes Is the patient deaf or have difficulty hearing?: No Does the patient have difficulty seeing, even when wearing glasses/contacts?: No Does the patient have difficulty concentrating, remembering, or making decisions?: Yes Patient able to express need for assistance with ADLs?: Yes Does the patient have difficulty dressing or bathing?: Yes Independently performs ADLs?: No Communication: Independent Does the patient have difficulty walking or climbing stairs?: Yes Weakness of Legs: Both Weakness of Arms/Hands: None  Permission Sought/Granted Permission sought to share information with : Case Manager Permission granted to share information with : Yes, Verbal Permission Granted     Permission granted to share info w AGENCY: [facility]  Permission granted to share info w Relationship: daughter, [name] [name].  Patient's son, healthcare and financial POA, unavailable by phone and patient and daughter, [name] [name], are unable to provide number to contact.     Emotional Assessment Appearance:: Appears stated age Attitude/Demeanor/Rapport: Complaining, Suspicious, Reactive, Engaged Affect (typically observed): Frustrated, Defensive Orientation: : Oriented to Self, Oriented to Place, Oriented to  Time, Oriented to Situation Alcohol / Substance Use: Not Applicable Psych Involvement:  No (comment)  Admission diagnosis:  Dehydration [E86.0] Ileus ([place]) [K56.7] Altered mental status, unspecified altered mental status type [R41.82] Patient Active Problem List   Diagnosis Date Noted  . Acute encephalopathy 11/02/2019  . Ileus ([place]) 11/01/2019  . Epigastric hernia 10/29/2019  . AKI (acute kidney injury) ([place]) 05/04/2019  . Hypotension 05/03/2019  . Anxiety 03/22/2018  . COPD with  acute exacerbation ([place]) 12/28/2017  . Chronic respiratory failure with hypoxia and hypercapnia ([place]) 11/04/2016  . Tinea 09/26/2016  . Dyspnea on exertion 09/21/2016  . Morbid (severe) obesity due to excess calories ([place]) 09/21/2016  . History of left breast cancer 06/08/2016  . Panic attacks 04/27/2016  . Chest pain 04/11/2016  . OSA (obstructive sleep apnea) 02/12/2016  . Adhesive capsulitis of right shoulder 08/21/2015  . Right shoulder pain 07/16/2015  . Osteoarthritis, hand 07/16/2015  . Hair loss 07/16/2015  . Blood in mouth of unknown source 05/06/2015  . Dysphagia, pharyngoesophageal phase 05/06/2015  . Chronic respiratory failure ([place]) 10/28/2014  . Cellulitis of female breast 10/25/2014  . Rash and nonspecific skin eruption 10/25/2014  . Hx of colonic polyps   . Benign neoplasm of cecum   . Benign neoplasm of transverse colon   . CHF (congestive heart failure) ([place])   . History of Clostridium difficile 06/27/2014  . RUQ pain 05/31/2014  . Lump in the abdomen 05/31/2014  . Diarrhea 05/14/2014  . Abdominal pain, other specified site 05/14/2014  . Pacemaker 12/03/2013  . Exfoliative dermatitis 11/27/2013  . Urinary urgency 11/13/2013  . Post-menopausal bleeding 11/13/2013  . Syncope and collapse 08/30/2013  . Atrial fibrillation ([place])   . Syncope 08/26/2013  . Postmenopausal vaginal bleeding 05/02/2013  . Tachy-brady syndrome ([place]) 03/15/2013  . Acute on chronic diastolic CHF (congestive heart failure) ([place]) 03/15/2013  . PFO (patent foramen ovale) 03/15/2013  . Hypokalemia 03/15/2013  . Atrial flutter ([place]) 03/01/2013  . Tachycardia 03/01/2013  . Anticoagulant long-term use 03/01/2013  . Impaired glucose tolerance 11/01/2012  . Abnormal liver function tests 11/01/2012  . Right lumbar radiculopathy 04/28/2012  . Cough 04/15/2012  . Abnormal TSH 04/15/2012  . Acute appendicitis 03/29/2012  . Sick sinus syndrome ([place]) 03/08/2012  . Morbid obesity with BMI of  50.0-59.9, adult ([place]) 03/08/2012  . Breast cancer ([place]) 06/13/2011  . Asthma 06/13/2011  . Depression 06/13/2011  . Colon polyps 06/13/2011  . Neuropathy 06/13/2011  . Cervical radiculopathy 06/13/2011  . Gout 06/13/2011  . Obesity hypoventilation syndrome ([place]) 06/13/2011  . Cellulitis of leg, right 06/13/2011  . Encounter for well adult exam with abnormal findings 06/04/2011  . OXYGEN-USE OF SUPPLEMENTAL 04/07/2010  . ACUT MI SUBENDOCARDIAL INFARCT SUBSQT EPIS CARE 03/13/2010  . COPD GOLD III 03/10/2010  . Sleep apnea 03/10/2010  . Hypothyroidism, acquired 03/17/2009  . Hyperlipidemia 03/17/2009  . Essential hypertension 03/17/2009  . Coronary atherosclerosis 03/17/2009   PCP:  Biagio Borg, MD Pharmacy:   [facility] [address] [facility] [place], [place] [address] [place] [address] Phone: [phone] Fax: [phone]  [facility] [phone] - [place], [place] - [address] [place] [address] Phone: [phone] Fax: [facility] [place], [address] [place] [address] Phone: [phone] Fax: [phone]     Social Determinants of Health (SDOH) Interventions    Readmission Risk Interventions Readmission Risk Prevention Plan 05/05/2019  Transportation Screening Complete  PCP  or Specialist Appt within 3-5 Days Complete  HRI or [place] Complete  Social Work Consult for [place] Planning/Counseling Complete  Palliative Care Screening Not Applicable  Medication Review Press photographer) Complete  Some recent data might be hidden

## 2019-11-06 NOTE — Progress Notes (Deleted)
I was notified by the case manager that patient is unsafe at home by herself.  She does not have 24-hour caregiver because her daughter works out of the city.  We are canceling disorder and continue to enforce mobility, physical therapy involvement until she is strong enough to go home by herself. Continue current management for now.

## 2019-11-06 NOTE — Consult Note (Signed)
   [facility] CM Inpatient Consult   11/06/2019  [name] [name] [birth year] [id number]    Patient screened for less than 7 days and 30 day readmissions in the past 6 months with 23% high risk score for unplanned readmission/ hospitalizations; as well as to check for possible needs of [facility]) Care Management services.    Chart reviewed and MD brief narrative shows as: 74 year old female with history of COPD, chronic diastolic CHF, sleep apnea, paroxysmal A. fib, chronic anemia, CKD stage III with baseline creatinine of 1.3, sick sinus syndrome status post sick sinus syndrome, hypothyroidism, status post ventral hernia surgery (10/29/19) about 5 days ago before this admission date.    She was readmitted for the management of acute on chronic respiratory failure with hypoxia and hypercarbia due to congestive heart failure.  Primary Care Provider is Dr. [name] with [facility], this office is listed to provide transition of care follow-up.  Review of PT/ OT notes with recommendation for home health services, as long as family can provide increased levels of assist. Was able to speak with Inpatient transition of care CM regarding needs and disposition plan, still in progress.  Plan: Continue to follow progress and disposition to assess for post hospital care management needs with [facility].  Please place a [facility] Care Management referral for community follow-up as appropriate.       ADDENDUM:  Spoke with Inpatient transition of care CM and was informed of being able to speak to patient's daughter on disposition (drop off patient's key and oxygen tank as she is unable to provide assistance to patient). Patient lives alone prior to admission and is adamant of going home; was arranged [facility] home [facility] with [facility].     Was able to speak with patient over the room phone with RN's assistance. HIPAA verified. Patient reports that she is going home and would not like going  to a rehab facility. Patient states would not like son (who is her POA) to be contacted since she is still able to make decisions for herself.   [facility]) care management services discussed with patient and she was agreeable to services at this time. She agreed to phone call follow-up from [facility] care management coordinator as a layer of support for community needs and complex disease management of HF that had brought about this readmission.  RN aware and assisting patient to get her cell phone and house key from daughter.   Patient denies need for medication/ pharmacy as she manages her own medications using "pill box" and uses [facility] Order delivery service and [place], [place].  She is aware of her [employer] transport benefits. Patient also verbalized that her daughter used to provide food and do groceries for her, however, she is also able to order food and have her groceries delivered if her daughter can't.  She also reports having walker (rolling and plain) to use at home; has grab bars in the bathroom and a safety alarm.      Of note, [facility] Care Management services does not replace or interfere with any services that are arranged by transition of care case management or social work.   Referral made to [place] and [facility] social worker for community resources and complex disease management of HF.    For questions and additional information, please contact:   Maysa Lynn A. Braelyn Jenson, BSN, RN-BC [facility] Liaison Cell: [phone]

## 2019-11-06 NOTE — Progress Notes (Signed)
Attempt to review discharge paperwork with patient.  She stated since the only med change was to the fluid medication(LASIX) she didn't need to review the rest of the medications.  Appointments reviewed with patient.   Spoke with patient's daughter [name].  She states that she is unable to pick the patient up but has dropped of the patient's house key and portable O2 tank.  Patient requested I call her neighbor Estill Bamberg to come and pick her up.  The provided home number rang once then went to busy.  The provided cell number rang and went to voicemail.  Spoke with patient about her options for transport home today.  She states that she agrees to ambulance transport.  During this conversation portable O2 tank noted to be beeping.  Offered to hook O2 tubing to hospital supply but patient adamant about just changing her battery.   Jacqlyn Krauss Case manager notified of patient decision to go home via ambulance transport.

## 2019-11-06 NOTE — Discharge Summary (Signed)
Physician Discharge Summary  CELESTIAL BARNFIELD WPY:[id number] DOB: 23-Jun-1946 DOA: 11/01/2019  PCP: Biagio Borg, MD  Admit date: 11/01/2019 Discharge date: 11/06/2019  Admitted From: Home Disposition:  Home  Discharge Condition:Stable CODE STATUS:FULL Diet recommendation: Heart Healthy  Brief/Interim Summary:  Patient is a 74 year old female with history of COPD, chronic diastolic CHF, sleep apnea, paroxysmal A. fib, chronic anemia, CKD stage III with baseline creatinine of 1.3, sick sinus syndrome status post sick sinus syndrome, hypothyroidism, status post ventral hernia surgery about 5 days ago before admission date and was discharged the following day became lethargic, had decreased oral intake and was brought to the emergency department.  On presentation, she was found to have AKI.  CT imaging of abdomen and pelvis showed fluid overload, features of IBS.  She was found to be  Lethargic and confused .  General surgery requested for our consultation and we are assuming care.  She was admitted for the management of acute on chronic respiratory failure with hypoxia and hypercarbia due to congestive heart failure.  She had to be put on BiPAP and started on IV lasix. Respiratory status improved and she is on nasal cannula back to her baseline oxygen requirement.  She had almost 12 L net output during this hospitalization. She is hemodynamically stable for discharge to home today. Plan for discharge today to home with home health.  Following problems were addressed during her hospitalization:  Acute on chronic respiratory failure with hypoxia/hypercarbia: Presented with shortness of breath, altered mental status from hypercarbia.  ABG showed CO2 in the range of 70s.  Put on BiPAP. She is on oxygen at 3 L/min at home. Respiratory status has improved.  Currently she is on 3 L of oxygen per minute maintaining saturation.  Acute on chronic diastolic CHF: EF as per last echo was 55 to 60% with grade 1  diastolic dysfunction.  Patient had significant edema with features of anasarca.  Started on Lasix 80 mg IV every 12.  BNP was elevated.  She follows with cardiology. She had net output of more than 12L since admission.  Lasix increased to 60 mg twice a day at home.  Right upper extremity edema: Most likely associated  with IV infiltration.  Venous Doppler negative for  DVT  Acute metabolic encephalopathy:Most likely secondary to hypercarbia.  ABG had shown hypercarbia.CT head was unremarkable.  Ammonia  level normal. Currently she is alert and oriented.  AKI on CKD stage IIIb: Presented with volume overload.  Likley cardiorenal syndrome.Baseline creatinine around 1.6.  Kidney function at baseline now.  Continue Lasix.  Check BMP in  A week.  COPD: Not wheezing at this time.  Has hypercarbia.  Continue BiPAP.  She follows with pulmonology.  She is on 3 L of oxygen per minute at home.  Paroxysmal A. fib: Currently normal sinus rhythm.  On metoprolol and Eliquis  History of sick sinus syndrome: Status post pacemaker  Chronic normocytic anemia: Could be associated  with chronic renal disease.  Continue to monitor.  H&H stable  Sleep apnea: On cpap at night at home  History of gout: On allopurinol  Hyperlipidemia: On statin  Recent ventral hernia surgery: general surgery was following.  Denies any abdominal pain.  She has appointment with  general surgery as an outpatient.  History of depression: On Cymbalta  Hypothyroidism: Continue Synthyroid    Discharge Diagnoses:  Principal Problem:   Acute on chronic diastolic CHF (congestive heart failure) (HCC) Active Problems:   Hypothyroidism, acquired  COPD GOLD III   Atrial fibrillation (HCC)   Chronic respiratory failure (HCC)   Ileus (HCC)   Acute encephalopathy    Discharge Instructions  Discharge Instructions    Diet - low sodium heart healthy   Complete by: As directed    Discharge instructions    Complete by: As directed    1)Please follow up with your PCP in a week.  Do a BMP test during the follow-up. 2)Take prescribed medications as instructed.  Monitor your weight at home.  Restrict fluid intake to less than 1.2 L a day. 3)Follow up with your cardiologist in 2 weeks.  Follow-up with general surgery. 4)Follow up with home health.   Increase activity slowly   Complete by: As directed      Allergies as of 11/06/2019      Reactions   Cephalexin Other (See Comments)   Exfoliative dermatitis reaction   Ciprofloxacin Other (See Comments)   Folliculitis   Lisinopril Cough   Codeine    REACTION: unknown - pt. states unaware of having reaction to codeine   Latex Dermatitis   Tape Dermatitis   Cleocin [clindamycin Hcl] Rash   Dilaudid [hydromorphone] Other (See Comments)   Oversedation   Doxycycline Rash   Penicillins Rash   Did it involve swelling of the face/tongue/throat, SOB, or low BP? No Did it involve sudden or severe rash/hives, skin peeling, or any reaction on the inside of your mouth or nose? Yes Did you need to seek medical attention at a hospital or doctor's office?Yes When did it last happen?several years ago If all above answers are "NO", may proceed with cephalosporin use.      Medication List    TAKE these medications   allopurinol 300 MG tablet Commonly known as: ZYLOPRIM TAKE 1 TABLET DAILY   amitriptyline 100 MG tablet Commonly known as: ELAVIL Take 1 tablet (100 mg total) by mouth at bedtime.   apixaban 5 MG Tabs tablet Commonly known as: Eliquis Take 1 tablet (5 mg total) by mouth 2 (two) times daily.   atorvastatin 40 MG tablet Commonly known as: LIPITOR TAKE 1 TABLET DAILY What changed: when to take this   AZO-CRANBERRY PO Take 1 tablet by mouth daily as needed (urinary symptoms).   budesonide-formoterol 160-4.5 MCG/ACT inhaler Commonly known as: Symbicort Inhale 2 puffs into the lungs 2 (two) times daily.   cholecalciferol 25  MCG (1000 UNIT) tablet Commonly known as: VITAMIN D3 Take 2,000 Units by mouth daily.   clonazePAM 0.5 MG tablet Commonly known as: KLONOPIN Take 1 tablet (0.5 mg total) by mouth daily as needed for anxiety.   DULoxetine 30 MG capsule Commonly known as: CYMBALTA Take 2 capsules (60 mg total) by mouth at bedtime.   ferrous sulfate 325 (65 FE) MG tablet Take 325 mg by mouth at bedtime.   furosemide 40 MG tablet Commonly known as: LASIX Take 1 tablet (40 mg total) by mouth 2 (two) times daily. What changed:   how much to take  how to take this  when to take this  additional instructions   levothyroxine 200 MCG tablet Commonly known as: SYNTHROID Take 1 tablet (200 mcg total) by mouth daily.   metoprolol tartrate 25 MG tablet Commonly known as: LOPRESSOR Take 0.5 tablets (12.5 mg total) by mouth as needed (for heart rate above 120 at rest).   Mucinex Maximum Strength 1200 MG Tb12 Generic drug: Guaifenesin Take 1,200 mg by mouth 2 (two) times daily.   omeprazole 40 MG  capsule Commonly known as: PRILOSEC Take 1 capsule (40 mg total) by mouth daily. What changed:   when to take this  reasons to take this   OXYGEN Inhale 2-3 L/min into the lungs continuous.   potassium chloride SA 20 MEQ tablet Commonly known as: KLOR-CON Take 1 tablet (20 mEq total) by mouth 2 (two) times daily.   ProAir HFA 108 (90 Base) MCG/ACT inhaler Generic drug: albuterol Inhale 2 puffs into the lungs See admin instructions. Inhale 2 puffs into the lungs in the morning, 2 puffs at bedtime, and one to two times daily as needed for shortness of breath or wheezing   traMADol 50 MG tablet Commonly known as: ULTRAM Take 1 tablet (50 mg total) by mouth daily as needed for moderate pain. What changed: when to take this   VITAMIN B COMPLEX PO Take 1 tablet by mouth daily.   vitamin C 500 MG tablet Commonly known as: ASCORBIC ACID Take 500 mg by mouth daily.   VITAMIN E PO Take 1,000  Units by mouth daily.      Follow-up Information    Coralie Keens, MD. Schedule an appointment as soon as possible for a visit in 3 week(s).   Specialty: General Surgery Why: Follow-up with your surgeon after repair of your hernia Contact information: [address] [place] [address] [phone]        Biagio Borg, MD. Schedule an appointment as soon as possible for a visit in 1 week(s).   Specialties: Internal Medicine, Radiology Contact information: [place] [address] [phone]        Lelon Perla, MD. Schedule an appointment as soon as possible for a visit in 2 week(s).   Specialty: Cardiology Contact information: [address] [place] [address] [phone]          Allergies  Allergen Reactions  . Cephalexin Other (See Comments)    Exfoliative dermatitis reaction  . Ciprofloxacin Other (See Comments)    Folliculitis   . Lisinopril Cough  . Codeine     REACTION: unknown - pt. states unaware of having reaction to codeine  . Latex Dermatitis  . Tape Dermatitis  . Cleocin [Clindamycin Hcl] Rash  . Dilaudid [Hydromorphone] Other (See Comments)    Oversedation  . Doxycycline Rash  . Penicillins Rash    Did it involve swelling of the face/tongue/throat, SOB, or low BP? No Did it involve sudden or severe rash/hives, skin peeling, or any reaction on the inside of your mouth or nose? Yes Did you need to seek medical attention at a hospital or doctor's office?Yes When did it last happen?several years ago If all above answers are "NO", may proceed with cephalosporin use.    Consultations:  General surgery   Procedures/Studies: CT ABDOMEN PELVIS WO CONTRAST  Result Date: 11/01/2019 CLINICAL DATA:  Post ventral hernia repair 3 days ago. Abdominal distention. EXAM: CT ABDOMEN AND PELVIS WITHOUT CONTRAST TECHNIQUE: Multidetector CT imaging of the abdomen and pelvis was performed following  the standard protocol without IV contrast. COMPARISON:  03/28/2012 FINDINGS: Lower chest: Small bilateral pleural effusions. Bibasilar atelectasis. Cardiomegaly. Pacer wires noted in the right heart. Hepatobiliary: High-density material seen within the gallbladder may reflect layering stones. No focal hepatic abnormality or biliary ductal dilatation. Pancreas: No focal abnormality or ductal dilatation. Spleen: No focal abnormality.  Normal size. Adrenals/Urinary Tract: Low-density lesions in the mid and lower pole of the left kidney, likely cysts. These cannot be  characterized on this noncontrast study. No hydronephrosis, renal or ureteral stone. Adrenal glands and urinary bladder unremarkable. Stomach/Bowel: Gaseous distention of portions of the colon, predominantly sigmoid colon and transverse colon, likely ileus. There is left colonic diverticulosis. No active diverticulitis. Stomach and small bowel decompressed. Vascular/Lymphatic: Heavily calcified aorta and iliac vessels. No aneurysm or adenopathy. Reproductive: Uterus and adnexa unremarkable.  No mass. Other: Moderate free fluid in the abdomen and pelvis. Extensive subcutaneous edema. Soft tissue noted in the anterior upper abdomen in the region of recent hernia repair, likely postoperative changes. No focal fluid collection to suggest abscess. Musculoskeletal: No acute bony abnormality. IMPRESSION: Gas arc a appearance with small bilateral effusions, moderate ascites, and diffuse subcutaneous edema. Gaseous distention of the colon postoperatively, likely ileus. Aortic atherosclerosis.  Cardiomegaly. Electronically Signed   By: Rolm Baptise M.D.   On: 11/01/2019 18:52   CT Head Wo Contrast  Result Date: 11/01/2019 CLINICAL DATA:  Encephalopathy EXAM: CT HEAD WITHOUT CONTRAST TECHNIQUE: Contiguous axial images were obtained from the base of the skull through the vertex without intravenous contrast. COMPARISON:  08/30/2013 FINDINGS: Brain: Image quality  degraded by motion. No acute intracranial abnormality. Specifically, no hemorrhage, hydrocephalus, mass lesion, acute infarction, or significant intracranial injury. Vascular: No hyperdense vessel or unexpected calcification. Skull: No acute calvarial abnormality. Sinuses/Orbits: Visualized paranasal sinuses and mastoids clear. Orbital soft tissues unremarkable. Other: None IMPRESSION: No acute intracranial abnormality. Electronically Signed   By: Rolm Baptise M.D.   On: 11/01/2019 18:53   DG Chest Port 1 View  Result Date: 11/01/2019 CLINICAL DATA:  Short of breath EXAM: PORTABLE CHEST 1 VIEW COMPARISON:  11/01/2019 at 4 3 p.m. FINDINGS: Single frontal view of the chest demonstrates stable pacemaker. Cardiac silhouette remains enlarged. Slight improvement in central vascular congestion seen previously. No airspace disease, effusion, or pneumothorax. IMPRESSION: 1. Persistent but improved central vascular congestion. 2. Stable enlargement of the cardiac silhouette. Electronically Signed   By: Randa Ngo M.D.   On: 11/01/2019 23:29   DG Chest Port 1 View  Result Date: 11/01/2019 CLINICAL DATA:  Short of breath and chills and confusion. Recent hernia surgery. History of colon carcinoma treated with radiation and chemotherapy. EXAM: PORTABLE CHEST 1 VIEW COMPARISON:  05/10/2019 FINDINGS: Moderate enlargement of the cardiopericardial silhouette. No mediastinal or hilar masses. There is vascular congestion. No pulmonary edema. Lungs are otherwise clear. No convincing pleural effusion and no pneumothorax. Left anterior chest wall sequential pacemaker. Ventricular lead not included on the field of view, otherwise stable from the prior study. Skeletal structures are grossly intact. IMPRESSION: 1. Moderate cardiomegaly with vascular congestion, but no convincing pulmonary edema. No evidence of pneumonia. No acute findings. Electronically Signed   By: Lajean Manes M.D.   On: 11/01/2019 16:33   CUP PACEART  REMOTE DEVICE CHECK  Result Date: 11/06/2019 Scheduled remote reviewed.  Normal device function.  Next remote 91 days.  VAS [place] UPPER EXTREMITY VENOUS DUPLEX  Result Date: 11/05/2019 UPPER VENOUS STUDY  Indications: Edema, and IV infiltration Limitations: Edema. Comparison Study: No prior study on file for comparison Performing Technologist: Sharion Dove RVS  Examination Guidelines: A complete evaluation includes B-mode imaging, spectral Doppler, color Doppler, and power Doppler as needed of all accessible portions of each vessel. Bilateral testing is considered an integral part of a complete examination. Limited examinations for reoccurring indications may be performed as noted.  Left Findings: +----------+------------+---------+-----------+----------+---------------+ LEFT      CompressiblePhasicitySpontaneousProperties    Summary     +----------+------------+---------+-----------+----------+---------------+ IJV  Yes       Yes                              +----------+------------+---------+-----------+----------+---------------+ Subclavian               Yes       Yes                              +----------+------------+---------+-----------+----------+---------------+ Axillary      Full       Yes       Yes                              +----------+------------+---------+-----------+----------+---------------+ Brachial      Full       Yes       Yes                              +----------+------------+---------+-----------+----------+---------------+ Radial        Full                                                  +----------+------------+---------+-----------+----------+---------------+ Ulnar                                               patent by color +----------+------------+---------+-----------+----------+---------------+ Basilic       Full                                                   +----------+------------+---------+-----------+----------+---------------+  Summary:  Left: No evidence of deep vein thrombosis in the upper extremity. No evidence of superficial vein thrombosis in the upper extremity.  *See table(s) above for measurements and observations.  Diagnosing physician: Ruta Hinds MD Electronically signed by Ruta Hinds MD on 11/05/2019 at 5:20:41 PM.    Final        Subjective:  Patient seen and examined the bedside this morning.  Hemodynamically stable for discharge today.  Discharge Exam: Vitals:   11/06/19 0616 11/06/19 0747  BP: 99/83   Pulse: 91 90  Resp:  17  Temp: 98.1 F (36.7 C)   SpO2: 100% 100%   Vitals:   11/05/19 2000 11/05/19 2310 11/06/19 0616 11/06/19 0747  BP:   99/83   Pulse: 83 93 91 90  Resp: 19 18  17   Temp:   98.1 F (36.7 C)   TempSrc:   Oral   SpO2: 98% 94% 100% 100%  Weight:   126.6 kg   Height:        General: Pt is alert, awake, not in acute distress Cardiovascular: RRR, S1/S2 +, no rubs, no gallops Respiratory: CTA bilaterally, no wheezing, no rhonchi Abdominal: Soft, NT, ND, bowel sounds + Extremities: bilateral lower extremity edema, no cyanosis    The results of significant diagnostics from this hospitalization (including imaging, microbiology, ancillary and laboratory) are listed below for reference.  Microbiology: Recent Results (from the past 240 hour(s))  Respiratory Panel by RT PCR (Flu A&B, Covid) - Nasopharyngeal Swab     Status: None   Collection Time: 11/01/19  4:23 PM   Specimen: Nasopharyngeal Swab  Result Value Ref Range Status   SARS Coronavirus 2 by RT PCR NEGATIVE NEGATIVE Final    Comment: (NOTE) SARS-CoV-2 target nucleic acids are NOT DETECTED. The SARS-CoV-2 RNA is generally detectable in upper respiratoy specimens during the acute phase of infection. The lowest concentration of SARS-CoV-2 viral copies this assay can detect is 131 copies/mL. A negative result does not preclude  SARS-Cov-2 infection and should not be used as the sole basis for treatment or other patient management decisions. A negative result may occur with  improper specimen collection/handling, submission of specimen other than nasopharyngeal swab, presence of viral mutation(s) within the areas targeted by this assay, and inadequate number of viral copies (<131 copies/mL). A negative result must be combined with clinical observations, patient history, and epidemiological information. The expected result is Negative. Fact Sheet for Patients:  PinkCheek.be Fact Sheet for Healthcare Providers:  GravelBags.it This test is not yet ap proved or cleared by the [place] FDA and  has been authorized for detection and/or diagnosis of SARS-CoV-2 by FDA under an Emergency Use Authorization (EUA). This EUA will remain  in effect (meaning this test can be used) for the duration of the COVID-19 declaration under Section 564(b)(1) of the Act, 21 U.S.C. section 360bbb-3(b)(1), unless the authorization is terminated or revoked sooner.    Influenza A by PCR NEGATIVE NEGATIVE Final   Influenza B by PCR NEGATIVE NEGATIVE Final    Comment: (NOTE) The Xpert Xpress SARS-CoV-2/FLU/RSV assay is intended as an aid in  the diagnosis of influenza from Nasopharyngeal swab specimens and  should not be used as a sole basis for treatment. Nasal washings and  aspirates are unacceptable for Xpert Xpress SARS-CoV-2/FLU/RSV  testing. Fact Sheet for Patients: PinkCheek.be Fact Sheet for Healthcare Providers: GravelBags.it This test is not yet approved or cleared by the [place] FDA and  has been authorized for detection and/or diagnosis of SARS-CoV-2 by  FDA under an Emergency Use Authorization (EUA). This EUA will remain  in effect (meaning this test can be used) for the duration of the  Covid-19  declaration under Section 564(b)(1) of the Act, 21  U.S.C. section 360bbb-3(b)(1), unless the authorization is  terminated or revoked. Performed at [facility], [place] [address] [place], [place] [address]   Culture, blood (routine x 2)     Status: None   Collection Time: 11/01/19  4:25 PM   Specimen: BLOOD RIGHT FOREARM  Result Value Ref Range Status   Specimen Description BLOOD RIGHT FOREARM  Final   Special Requests   Final    BOTTLES DRAWN AEROBIC AND ANAEROBIC Blood Culture adequate volume   Culture   Final    NO GROWTH 5 DAYS Performed at [facility], [place] [address], [place], [place] [address]    Report Status 11/06/2019 FINAL  Final  Culture, blood (routine x 2)     Status: None   Collection Time: 11/01/19  5:00 PM   Specimen: BLOOD RIGHT HAND  Result Value Ref Range Status   Specimen Description BLOOD RIGHT HAND  Final   Special Requests AEROBIC BOTTLE ONLY Blood Culture adequate volume  Final   Culture   Final    NO GROWTH 5 DAYS Performed at [facility], [place]  [address], [place], [place] [address]    Report Status 11/06/2019 FINAL  Final     Labs: BNP (last 3 results) Recent Labs    11/01/19 1623  BNP 2,947.6*   Basic Metabolic Panel: Recent Labs  Lab 11/02/19 0514 11/03/19 0441 11/04/19 0423 11/05/19 0326 11/06/19 0411  NA 144 146* 144 147* 142  K 4.3 3.8 3.2* 3.7 3.4*  CL 102 99 94* 90* 86*  CO2 30 34* 41* 45* 44*  GLUCOSE 78 76 91 79 79  BUN 26* 23 17 13 12   CREATININE 1.70* 1.48* 1.19* 1.08* 1.00  CALCIUM 9.2 9.1 8.9 8.9 8.9   Liver Function Tests: Recent Labs  Lab 11/01/19 1622  AST 31  ALT 20  ALKPHOS 144*  BILITOT 1.5*  PROT 6.6  ALBUMIN 3.3*   No results for input(s): LIPASE, AMYLASE in the last 168 hours. Recent Labs  Lab 11/01/19 2318  AMMONIA 38*   CBC: Recent Labs  Lab 11/01/19 1622 11/03/19 0441  WBC 7.4 7.7  NEUTROABS 5.9 5.7  HGB 10.8* 12.0  HCT 38.1 40.7  MCV 104.4* 99.0  PLT 188 179    Cardiac Enzymes: No results for input(s): CKTOTAL, CKMB, CKMBINDEX, TROPONINI in the last 168 hours. BNP: Invalid input(s): POCBNP CBG: Recent Labs  Lab 11/01/19 1553 11/02/19 0150  GLUCAP 79 76   D-Dimer No results for input(s): DDIMER in the last 72 hours. Hgb A1c No results for input(s): HGBA1C in the last 72 hours. Lipid Profile No results for input(s): CHOL, HDL, LDLCALC, TRIG, CHOLHDL, LDLDIRECT in the last 72 hours. Thyroid function studies No results for input(s): TSH, T4TOTAL, T3FREE, THYROIDAB in the last 72 hours.  Invalid input(s): FREET3 Anemia work up No results for input(s): VITAMINB12, FOLATE, FERRITIN, TIBC, IRON, RETICCTPCT in the last 72 hours. Urinalysis    Component Value Date/Time   COLORURINE AMBER (A) 05/04/2019 0921   APPEARANCEUR CLOUDY (A) 05/04/2019 0921   LABSPEC 1.015 05/04/2019 0921   PHURINE 5.0 05/04/2019 0921   GLUCOSEU NEGATIVE 05/04/2019 0921   GLUCOSEU NEGATIVE 05/12/2017 0919   HGBUR NEGATIVE 05/04/2019 0921   BILIRUBINUR NEGATIVE 05/04/2019 0921   KETONESUR NEGATIVE 05/04/2019 0921   PROTEINUR 100 (A) 05/04/2019 0921   UROBILINOGEN 0.2 05/12/2017 0919   NITRITE NEGATIVE 05/04/2019 0921   LEUKOCYTESUR NEGATIVE 05/04/2019 0921   Sepsis Labs Invalid input(s): PROCALCITONIN,  WBC,  LACTICIDVEN Microbiology Recent Results (from the past 240 hour(s))  Respiratory Panel by RT PCR (Flu A&B, Covid) - Nasopharyngeal Swab     Status: None   Collection Time: 11/01/19  4:23 PM   Specimen: Nasopharyngeal Swab  Result Value Ref Range Status   SARS Coronavirus 2 by RT PCR NEGATIVE NEGATIVE Final    Comment: (NOTE) SARS-CoV-2 target nucleic acids are NOT DETECTED. The SARS-CoV-2 RNA is generally detectable in upper respiratoy specimens during the acute phase of infection. The lowest concentration of SARS-CoV-2 viral copies this assay can detect is 131 copies/mL. A negative result does not preclude SARS-Cov-2 infection and should not be  used as the sole basis for treatment or other patient management decisions. A negative result may occur with  improper specimen collection/handling, submission of specimen other than nasopharyngeal swab, presence of viral mutation(s) within the areas targeted by this assay, and inadequate number of viral copies (<131 copies/mL). A negative result must be combined with clinical observations, patient history, and epidemiological information. The expected result is Negative. Fact Sheet for Patients:  PinkCheek.be Fact Sheet for Healthcare Providers:  GravelBags.it This  test is not yet ap proved or cleared by the [place] and  has been authorized for detection and/or diagnosis of SARS-CoV-2 by FDA under an Emergency Use Authorization (EUA). This EUA will remain  in effect (meaning this test can be used) for the duration of the COVID-19 declaration under Section 564(b)(1) of the Act, 21 U.S.C. section 360bbb-3(b)(1), unless the authorization is terminated or revoked sooner.    Influenza A by PCR NEGATIVE NEGATIVE Final   Influenza B by PCR NEGATIVE NEGATIVE Final    Comment: (NOTE) The Xpert Xpress SARS-CoV-2/FLU/RSV assay is intended as an aid in  the diagnosis of influenza from Nasopharyngeal swab specimens and  should not be used as a sole basis for treatment. Nasal washings and  aspirates are unacceptable for Xpert Xpress SARS-CoV-2/FLU/RSV  testing. Fact Sheet for Patients: PinkCheek.be Fact Sheet for Healthcare Providers: GravelBags.it This test is not yet approved or cleared by the [place] FDA and  has been authorized for detection and/or diagnosis of SARS-CoV-2 by  FDA under an Emergency Use Authorization (EUA). This EUA will remain  in effect (meaning this test can be used) for the duration of the  Covid-19 declaration under Section 564(b)(1) of the  Act, 21  U.S.C. section 360bbb-3(b)(1), unless the authorization is  terminated or revoked. Performed at [facility], [place] [address], [place], [place] [address]   Culture, blood (routine x 2)     Status: None   Collection Time: 11/01/19  4:25 PM   Specimen: BLOOD RIGHT FOREARM  Result Value Ref Range Status   Specimen Description BLOOD RIGHT FOREARM  Final   Special Requests   Final    BOTTLES DRAWN AEROBIC AND ANAEROBIC Blood Culture adequate volume   Culture   Final    NO GROWTH 5 DAYS Performed at [facility], [place] [address] [place], [place] [address]    Report Status 11/06/2019 FINAL  Final  Culture, blood (routine x 2)     Status: None   Collection Time: 11/01/19  5:00 PM   Specimen: BLOOD RIGHT HAND  Result Value Ref Range Status   Specimen Description BLOOD RIGHT HAND  Final   Special Requests AEROBIC BOTTLE ONLY Blood Culture adequate volume  Final   Culture   Final    NO GROWTH 5 DAYS Performed at [facility], [place] [address] [place], [place] [address]    Report Status 11/06/2019 FINAL  Final    Please note: You were cared for by a hospitalist during your hospital stay. Once you are discharged, your primary care physician will handle any further medical issues. Please note that NO REFILLS for any discharge medications will be authorized once you are discharged, as it is imperative that you return to your primary care physician (or establish a relationship with a primary care physician if you do not have one) for your post hospital discharge needs so that they can reassess your need for medications and monitor your lab values.    Time coordinating discharge: 40 minutes  SIGNED:   Shelly Coss, MD  [facility] 11/06/2019, 11:11 AM Pager [phone]  If 7PM-7AM, please contact night-coverage [email or web address] Password [email or web address]

## 2019-11-06 NOTE — Progress Notes (Signed)
PPM Remote  

## 2019-11-07 ENCOUNTER — Telehealth: Payer: Self-pay | Admitting: *Deleted

## 2019-11-07 ENCOUNTER — Other Ambulatory Visit: Payer: Self-pay

## 2019-11-07 NOTE — Patient Outreach (Signed)
[facility]) Care Management  11/07/2019  [name] [name] 1945-11-11 [id number]   Transition of Care Referral  Referral Date: 11/07/2019 Referral Source: [facility] Liaison Date of Admission: 11/01/2019 Diagnosis: "AMS" Date of Discharge: 11/06/2019 Facility: [place]: [facility] Medicare *PCP Office Does [facility]  Outreach attempt # 1 to patient. No answer. RN CM left HIPAA compliant voicemail message along with contact info.    Plan: RN CM will make outreach attempt to patient within 3-4 business days. RN CM will send unsuccessful outreach letter to patient.   Enzo Montgomery, RN,BSN,CCM [place] Management Telephonic Care Management Coordinator Direct Phone: [phone] Toll Free: [phone] Fax: [phone]

## 2019-11-07 NOTE — Telephone Encounter (Signed)
Pt was on TCM report tried calling pt to make hosp follow-up there was no answer LMOM RTC....[name] [name]

## 2019-11-08 ENCOUNTER — Other Ambulatory Visit: Payer: Self-pay

## 2019-11-08 NOTE — Patient Outreach (Signed)
  [facility]) Care Management  11/08/2019  [name] [name] 19-May-1946 [id number]   Transition of Care Referral  Referral Date: 11/07/2019 Referral Source: [facility] Liaison Date of Admission: 11/01/2019 Diagnosis: "AMS" Date of Discharge: 11/06/2019 [place] [facility]   Outreach attempt #2 to patient. No answer at present-call went straight to voicemail.     Plan: RN CM will make outreach attempt to patient within 3-4 business days if no return call.   Enzo Montgomery, RN,BSN,CCM [place] Management Telephonic Care Management Coordinator Direct Phone: [phone] Toll Free: [phone] Fax: [phone]

## 2019-11-10 DIAGNOSIS — J449 Chronic obstructive pulmonary disease, unspecified: Secondary | ICD-10-CM | POA: Diagnosis not present

## 2019-11-12 ENCOUNTER — Encounter: Payer: Self-pay | Admitting: *Deleted

## 2019-11-12 ENCOUNTER — Other Ambulatory Visit: Payer: Self-pay

## 2019-11-12 ENCOUNTER — Other Ambulatory Visit: Payer: Self-pay | Admitting: *Deleted

## 2019-11-12 NOTE — Telephone Encounter (Signed)
Was not able to follow=-up w/pt on 2/18 due to office being close due to weather

## 2019-11-12 NOTE — Patient Outreach (Signed)
[facility]) Care Management  11/12/2019  [name] [name] 12-Dec-1945 [id number]   Transition of Care Referral  Referral Date:11/07/2019 Referral [facility] Liaison Date of Admission:11/01/2019 Diagnosis:"AMS" Date of Discharge:11/06/2019 [place] Medicare *PCP Office Does [facility]    Outreach attempt #3 to patient. No answer at present and voicemail message left. RN CM attempted to reach patient's daughter and no answer as well.      Plan: RN CM will close case if no return call or response from letter mailed to patient.    Enzo Montgomery, RN,BSN,CCM [place] Management Telephonic Care Management Coordinator Direct Phone: [phone] Toll Free: [phone] Fax: [phone]

## 2019-11-12 NOTE — Telephone Encounter (Signed)
Per chart pt called back today 11/12/19 Hosp f/u appt was made for 11/14/19.Marland KitchenJohny [name]

## 2019-11-12 NOTE — Patient Outreach (Signed)
[facility]) Care Management  11/12/2019  [name] [name] 23-Jun-1946 [id number]   CSW made an initial attempt to try and contact patient today to perform the initial phone assessment, as well as assess and assist with social work needs and services, without success.  A HIPAA compliant message was left for patient on voicemail.  CSW is currently awaiting a return call.  CSW will make a second outreach attempt within the next 3-4 business days, if a return call is not received from patient in the meantime.  CSW will also mail an Outreach Letter to patient's home requesting that patient contact CSW if patient is interested in receiving social work services through [place] with Scientist, clinical (histocompatibility and immunogenetics).  Nat Christen, BSW, MSW, LCSW  Licensed Education officer, [facility]  [place] [place] [address], [place], [place] [address] Physical Address-[address], [place], [place] [address] Toll Free Main # [phone] Fax # [phone] Cell # [phone]  Office # [phone] [email or web address]

## 2019-11-13 ENCOUNTER — Ambulatory Visit: Payer: Self-pay

## 2019-11-14 ENCOUNTER — Other Ambulatory Visit: Payer: Self-pay

## 2019-11-14 ENCOUNTER — Encounter: Payer: Self-pay | Admitting: Internal Medicine

## 2019-11-14 ENCOUNTER — Ambulatory Visit (INDEPENDENT_AMBULATORY_CARE_PROVIDER_SITE_OTHER): Payer: Medicare HMO | Admitting: Internal Medicine

## 2019-11-14 VITALS — BP 126/72 | HR 98 | Temp 99.0°F | Ht 62.0 in

## 2019-11-14 DIAGNOSIS — I5032 Chronic diastolic (congestive) heart failure: Secondary | ICD-10-CM

## 2019-11-14 NOTE — Assessment & Plan Note (Signed)
Pt with unrealstic expectations regarding her tx and daughter ability to care for her, Soc Services to see, and I will ask [facility]N as well.  I encouraged accepting the [facility] with PT as ordered, lab draws, daily home weights, good med compliance but she is upset at the end visit, throws up her hands and states she is not doing any of it.  I spent 40 minutes in addition to time for wellness examination in preparing to see the patient by review of recent labs, imaging and procedures, obtaining and reviewing separately obtained history, communicating with the patient and family or caregiver, ordering medications, tests or procedures, and documenting clinical information in the EHR including the differential Dx, treatment, and any further evaluation and other management of CHF

## 2019-11-14 NOTE — Progress Notes (Signed)
Subjective:    Patient ID: [name] [name], female    DOB: [birth year], 74 y.o.   MRN: [id number]  HPI  Here with daughter in f/u recent hospn for AMS and CHF now improved; has not been checking AM wts, but states good complince with meds.  Not sure about wt gain or leg swelling getting worse.[name]  Pt is quite irritable and appears very unhappy with her overall condition - "why do I do anything when I cant enjoy my life?"  Quite belligerent and argumentative with daughter who appears to have pt best interest in mind and supportive.  Pt denies recent AMS and CHF - "they wouldn't tell me whats going on and talked behind my back" in the hospital.  Pt requires 24/7 care but refused.  HH with PT was ordered and came to house but pt would not let them in - "I didn't know who they are."  Daughter works and cannot provide 24/7 care, and she suggests if pt cannot do more for herself she will end up 'ward of the state" to which the pt states she agrees, but also seems upset daughter cant do more.  Lots of arguing and discussion with daughter (not willing for me to really ask questions or suggest course of action) and pt clearly has unrealistic expectations going forward. Pt does have social services consult in place and rolls her eyes as if this is going to accomplish anything. Wt Readings from Last 3 Encounters:  11/06/19 279 lb 1.6 oz (126.6 kg)  10/29/19 293 lb (132.9 kg)  10/25/19 293 lb (132.9 kg)  Pt appears in denial about all her care hx, and plans for going forward, and still refuses HH, PT and even lab draw today.   Past Medical History:  Diagnosis Date  . Anemia   . Asthma 06/13/2011   hx of  . Atrial fibrillation ([place])   . Atrial flutter ([place])   . Breast cancer ([place]) dx'd 2005   No BP check or stick in Left arm  . CAD (coronary artery disease)    a. Nonobstructive CAD 2007 (EF 75%.  RCA  proximal 75% tubular, 25% prox LAD, 25% d1, 50% D2) at [facility]. b. NSTEMI 01/2010 in setting of respiratory failure,  medical approach (not a good candidate for noninvasive eval)  . Cellulitis    hx of cellulitis in left leg  . Cervical radiculopathy 06/13/2011  . CHF (congestive heart failure) ([place])   . Complication of anesthesia    per patient "hard to wake up and come out of the aneshthesia, happened years ago"  . COPD (chronic obstructive pulmonary disease) ([place])    on O2  . Depression 06/13/2011  . Diverticulosis   . Esophageal dysmotility   . Fatty liver   . Gout 06/13/2011  . History of Clostridium difficile early dec 2015   no current diarrhea  . History of home oxygen therapy    2 liters per nasal cannula all the time  . Hx of dysfunctional uterine bleeding    last bleeding jan 2016, worked up for with d and c x 2 no cause found  . Hyperlipidemia   . Hypertension   . Hypothyroidism   . Impaired glucose tolerance 11/01/2012  . Myocardial infarction ([place])   . Neuropathy 06/13/2011  . Neuropathy    per patient in hands and in feet  . Obesity hypoventilation syndrome ([place]) 06/13/2011  . OSA on CPAP   . Personal history of chemotherapy   .  Personal history of radiation therapy   . PFO (patent foramen ovale)   . Presence of permanent cardiac pacemaker   . Risk for falls   . Syncope and collapse    Bradycardia with pauses. S/P pacemaker  . Tubular adenoma of colon    Past Surgical History:  Procedure Laterality Date  . APPENDECTOMY    . BREAST BIOPSY    . BREAST LUMPECTOMY Left 05/2004  . CARDIOVERSION N/A 03/15/2013   Procedure: CARDIOVERSION;  Surgeon: Thayer Headings, MD;  Location: [facility] ENDOSCOPY;  Service: Cardiovascular;  Laterality: N/A;  . CERVICAL BIOPSY  June 2013   at [facility] for Heavy  Bleeding  . [place] SURGERY  2004 or 2005   have cadaver bones,, screws, and plates c 5 to c 6  . COLONOSCOPY    . COLONOSCOPY WITH PROPOFOL N/A 10/22/2014   Procedure: COLONOSCOPY WITH PROPOFOL;  Surgeon: Jerene Bears, MD;  Location: [facility] ENDOSCOPY;  Service: Gastroenterology;  Laterality: N/A;   . DILATION AND CURETTAGE OF UTERUS    . EPIGASTRIC HERNIA REPAIR    . EPIGASTRIC HERNIA REPAIR N/A 10/29/2019   Procedure: EPIGASTRIC HERNIA REPAIR WITH MESH;  Surgeon: Coralie Keens, MD;  Location: [place];  Service: General;  Laterality: N/A;  LMA ANESTHESIA  . EYE SURGERY    . LAPAROSCOPIC APPENDECTOMY  03/29/2012   Procedure: APPENDECTOMY LAPAROSCOPIC;  Surgeon: Adin Hector, MD;  Location: [facility] ORS;  Service: General;  Laterality: N/A;  . LOOP RECORDER EXPLANT  08/31/2013   Procedure: LOOP RECORDER EXPLANT;  Surgeon: Coralyn Mark, MD;  Location: [facility] CATH LAB;  Service: Cardiovascular;;  . LOOP RECORDER IMPLANT  08-28-2013; 08-31-2013   MDT LinQ implanted by Dr Rayann Heman for syncope; explanted 08-31-2013 after sinus pauses identified  . LOOP RECORDER IMPLANT N/A 08/28/2013   Procedure: LOOP RECORDER IMPLANT;  Surgeon: Coralyn Mark, MD;  Location: [place] CATH LAB;  Service: Cardiovascular;  Laterality: N/A;  . OVARIAN CYST REMOVAL    . PACEMAKER INSERTION  08-31-2013   Biotronik Evera dual chamber pacemaker placed for neurocardiogenic syncope and sinus pauses by Dr Rayann Heman  . PERMANENT PACEMAKER INSERTION N/A 08/31/2013   Procedure: PERMANENT PACEMAKER INSERTION;  Surgeon: Coralyn Mark, MD;  Location: [facility] CATH LAB;  Service: Cardiovascular;  Laterality: N/A;  . TEE WITHOUT CARDIOVERSION N/A 03/15/2013   Procedure: TRANSESOPHAGEAL ECHOCARDIOGRAM (TEE);  Surgeon: Thayer Headings, MD;  Location: [facility] ENDOSCOPY;  Service: Cardiovascular;  Laterality: N/A;    reports that she quit smoking about 21 years ago. Her smoking use included cigarettes. She has a 20.00 pack-year smoking history. She has never used smokeless tobacco. She reports current alcohol use. She reports that she does not use drugs. family history includes Breast cancer in her maternal aunt; Breast cancer (age of onset: 67) in her sister; Breast cancer (age of onset: 62) in her cousin; Diabetes in her brother; Heart disease in her mother;  Ovarian cancer in her sister and sister; Uterine cancer in her mother. Allergies  Allergen Reactions  . Cephalexin Other (See Comments)    Exfoliative dermatitis reaction  . Ciprofloxacin Other (See Comments)    Folliculitis   . Lisinopril Cough  . Codeine     REACTION: unknown - pt. states unaware of having reaction to codeine  . Latex Dermatitis  . Tape Dermatitis  . Cleocin [Clindamycin Hcl] Rash  . Dilaudid [Hydromorphone] Other (See Comments)    Oversedation  . Doxycycline Rash  . Penicillins Rash    Did  it involve swelling of the face/tongue/throat, SOB, or low BP? No Did it involve sudden or severe rash/hives, skin peeling, or any reaction on the inside of your mouth or nose? Yes Did you need to seek medical attention at a hospital or doctor's office?Yes When did it last happen?several years ago If all above answers are "NO", may proceed with cephalosporin use.   Current Outpatient Medications on File Prior to Visit  Medication Sig Dispense Refill  . albuterol (PROAIR HFA) 108 (90 Base) MCG/ACT inhaler Inhale 2 puffs into the lungs See admin instructions. Inhale 2 puffs into the lungs in the morning, 2 puffs at bedtime, and one to two times daily as needed for shortness of breath or wheezing    . allopurinol (ZYLOPRIM) 300 MG tablet TAKE 1 TABLET DAILY (Patient taking differently: Take 300 mg by mouth daily. ) 90 tablet 1  . amitriptyline (ELAVIL) 100 MG tablet Take 1 tablet (100 mg total) by mouth at bedtime. 90 tablet 1  . apixaban (ELIQUIS) 5 MG TABS tablet Take 1 tablet (5 mg total) by mouth 2 (two) times daily. 180 tablet 1  . atorvastatin (LIPITOR) 40 MG tablet TAKE 1 TABLET DAILY (Patient taking differently: Take 40 mg by mouth at bedtime. ) 90 tablet 4  . AZO-CRANBERRY PO Take 1 tablet by mouth daily as needed (urinary symptoms).     . B Complex Vitamins (VITAMIN B COMPLEX PO) Take 1 tablet by mouth daily.    . budesonide-formoterol (SYMBICORT) 160-4.5 MCG/ACT  inhaler Inhale 2 puffs into the lungs 2 (two) times daily. 30.6 g 2  . cholecalciferol (VITAMIN D3) 25 MCG (1000 UNIT) tablet Take 2,000 Units by mouth daily.    . clonazePAM (KLONOPIN) 0.5 MG tablet Take 1 tablet (0.5 mg total) by mouth daily as needed for anxiety. 90 tablet 1  . DULoxetine (CYMBALTA) 30 MG capsule Take 2 capsules (60 mg total) by mouth at bedtime. 180 capsule 1  . ferrous sulfate 325 (65 FE) MG tablet Take 325 mg by mouth at bedtime.    . furosemide (LASIX) 40 MG tablet Take 1 tablet (40 mg total) by mouth 2 (two) times daily. 45 tablet 3  . Guaifenesin (MUCINEX MAXIMUM STRENGTH) 1200 MG TB12 Take 1,200 mg by mouth 2 (two) times daily.    [name] levothyroxine (SYNTHROID) 200 MCG tablet Take 1 tablet (200 mcg total) by mouth daily. 90 tablet 1  . metoprolol tartrate (LOPRESSOR) 25 MG tablet Take 0.5 tablets (12.5 mg total) by mouth as needed (for heart rate above 120 at rest). 30 tablet 3  . omeprazole (PRILOSEC) 40 MG capsule Take 1 capsule (40 mg total) by mouth daily. (Patient taking differently: Take 40 mg by mouth daily as needed (acid reflux). ) 30 capsule 0  . OXYGEN Inhale 2-3 L/min into the lungs continuous.     . potassium chloride SA (KLOR-CON) 20 MEQ tablet Take 1 tablet (20 mEq total) by mouth 2 (two) times daily. 60 tablet 1  . traMADol (ULTRAM) 50 MG tablet Take 1 tablet (50 mg total) by mouth daily as needed for moderate pain. (Patient taking differently: Take 50 mg by mouth every 6 (six) hours as needed for moderate pain. ) 90 tablet 1  . vitamin C (ASCORBIC ACID) 500 MG tablet Take 500 mg by mouth daily.     [name] VITAMIN E PO Take 1,000 Units by mouth daily.      No current facility-administered medications on file prior to visit.   Review of  Systems All otherwise neg per pt     Objective:   Physical Exam BP 126/72   Pulse 98   Temp 99 F (37.2 C)   Ht 5' 2"  (1.575 m)   SpO2 99%   BMI 51.05 kg/m  VS noted,  Constitutional: Pt appears in NAD HENT: Head:  NCAT.  Right Ear: External ear normal.  Left Ear: External ear normal.  Eyes: . Pupils are equal, round, and reactive to light. Conjunctivae and EOM are normal Nose: without d/c or deformity Neck: Neck supple. Gross normal ROM Cardiovascular: Normal rate and regular rhythm.   Pulmonary/Chest: Effort normal and breath sounds without rales or wheezing.  Abd:  Soft, NT, ND, + BS, no organomegaly Neurological: Pt is alert. At baseline orientation, motor grossly intact Skin: Skin is warm. No rashes, other new lesions, trace to 1+ bilat LE edema Psychiatric: Pt behavior is normal without agitation  All otherwise neg per pt  Lab Results  Component Value Date   WBC 7.7 11/03/2019   HGB 12.0 11/03/2019   HCT 40.7 11/03/2019   PLT 179 11/03/2019   GLUCOSE 79 11/06/2019   CHOL 139 11/24/2017   TRIG 137 11/24/2017   HDL 48 11/24/2017   LDLCALC 64 11/24/2017   ALT 20 11/01/2019   AST 31 11/01/2019   NA 142 11/06/2019   K 3.4 (L) 11/06/2019   CL 86 (L) 11/06/2019   CREATININE 1.00 11/06/2019   BUN 12 11/06/2019   CO2 44 (H) 11/06/2019   TSH 3.536 05/03/2019   INR 1.7 (H) 11/01/2019   HGBA1C 5.2 05/03/2019  '     Assessment & Plan:

## 2019-11-14 NOTE — Patient Instructions (Signed)
Please continue all other medications as before, and refills have been done if requested.  Please have the pharmacy call with any other refills you may need.  Please keep your appointments with your specialists as you may have planned  Please check your weight at home every AM and write them down  You will be contacted regarding the referral for: [facility] to work on trying to avoid going back in the hospital  Please make an Appointment to return in 9 days  Please go to the LAB at the blood drawing area for the tests to be done  You will be contacted by phone if any changes need to be made immediately.  Otherwise, you will receive a letter about your results with an explanation, but please check with MyChart first

## 2019-11-15 ENCOUNTER — Other Ambulatory Visit: Payer: Self-pay | Admitting: *Deleted

## 2019-11-15 ENCOUNTER — Other Ambulatory Visit: Payer: Self-pay

## 2019-11-15 NOTE — Telephone Encounter (Signed)
Pt has requested a follow up with you for 3.14, but this is a Sunday. I will secure a better date.   Labs were not done yesterday as ordered.  Requested via mychart for pt to do ASAP.   Do you want any additional labs done prior to follow up?

## 2019-11-15 NOTE — Telephone Encounter (Signed)
Ok to let pt know -   I was not bothered at all by her behavior, so no need to feel bad about that.  OK for labs as ordered, and f/u as pt mentioned (or close to it)

## 2019-11-15 NOTE — Patient Outreach (Addendum)
[facility]) Care Management  11/15/2019  [name] [name] 06-Jul-1946 [id number]     Initial Assessment  Transition of Care Referral  Referral Date:11/07/2019 Referral [facility] Liaison Date of Admission:11/01/2019 Diagnosis:"AMS" Date of Discharge:11/06/2019 [place] [employer] *PCP Office Does [facility]  Telephone Screen  Referral Date: 11/14/2019 Referral Source: MD Office Referral Reason: "high risk for readmission" Insurance: [facility]   Multiple attempts to reach patient. RN CM able to reach patient on fourth attempt. Patient immediately begins to discuss the strain of her relationship with her daughter. She shares that she had PCP follow up appt and did not go so well. She states that she got upset with both her doctor and daughter. She voices that she has since apologized to MD. She reports that she is upset because she feels like she is "being left out of the loop about own care." She voices that her daughter "is not a caretaker at all" and she does not feel like she wants her involved and helping out her much.    Social: Patient reports that she resides in her home alone. Daughter lives nearby and assists as much as she will allow. Patient reports last fall was about 6 months or so ago. She relies on her daughter to take her to appts but states she is unsure if daughter will be able to continue to transport her and she will possibly need transportation assistance.[facility] SW referral already pending). She reports that she has a estranged relationship with her son as well and he is not very supportive and able to help care for her. She voices that she is independent with ADLs although it takes her some time to get things done. She confirms she has adequate DME in the home. Patient reports that she is not opposed to going to ALF/SNF if she becomes unable to safely live at home alone. She vaguely discuss that she had a "SW  come do a welfare check" last week but unable to recall any further details regarding the matter.    Conditions: Per chart review, patient has PMH that includes but not limited to hypothyroidism,COPD, A-fib, ventral hernia repair, CHF, sleep apnea, Anemia, CKD, HLD, Pacer and Breast CA(16 yrs ago s/sp chemo txs). Patient was recently hospitalized 11/01/2019-11/06/2019 for AMS. RN CM educated patient on disease process and mgmt of chronic conditions. Patient has scale in the home but not consistently weighing and recording wgt. Patient has been set up with [facility] services and voices HHPT coming to see her in a few days.    Advance Directives: Patient confirms that she has living [place]. Her son is her designated person. However, she states that she is in the process of getting that changed as she no longer wants him to be it. She is unsure if she wants her daughter to be it as she has some reservations about her making decisions for her. She declined assistance with this matter.   Appointments: Patient completed PCP appt on yesterday.   Medications: Med review completed with patient.She reports that she uses [employer] order for most her meds.  Current Medications:  Current Outpatient Medications  Medication Sig Dispense Refill  . albuterol (PROAIR HFA) 108 (90 Base) MCG/ACT inhaler Inhale 2 puffs into the lungs See admin instructions. Inhale 2 puffs into the lungs in the morning, 2 puffs at bedtime, and one to two times daily as needed for shortness of breath or wheezing    . allopurinol (ZYLOPRIM) 300 MG tablet  TAKE 1 TABLET DAILY (Patient taking differently: Take 300 mg by mouth daily. ) 90 tablet 1  . amitriptyline (ELAVIL) 100 MG tablet Take 1 tablet (100 mg total) by mouth at bedtime. 90 tablet 1  . apixaban (ELIQUIS) 5 MG TABS tablet Take 1 tablet (5 mg total) by mouth 2 (two) times daily. 180 tablet 1  . atorvastatin (LIPITOR) 40 MG tablet TAKE 1 TABLET DAILY (Patient taking differently:  Take 40 mg by mouth at bedtime. ) 90 tablet 4  . AZO-CRANBERRY PO Take 1 tablet by mouth daily as needed (urinary symptoms).     . B Complex Vitamins (VITAMIN B COMPLEX PO) Take 1 tablet by mouth daily.    . budesonide-formoterol (SYMBICORT) 160-4.5 MCG/ACT inhaler Inhale 2 puffs into the lungs 2 (two) times daily. 30.6 g 2  . cholecalciferol (VITAMIN D3) 25 MCG (1000 UNIT) tablet Take 2,000 Units by mouth daily.    . clonazePAM (KLONOPIN) 0.5 MG tablet Take 1 tablet (0.5 mg total) by mouth daily as needed for anxiety. 90 tablet 1  . DULoxetine (CYMBALTA) 30 MG capsule Take 2 capsules (60 mg total) by mouth at bedtime. 180 capsule 1  . ferrous sulfate 325 (65 FE) MG tablet Take 325 mg by mouth at bedtime.    . furosemide (LASIX) 40 MG tablet Take 1 tablet (40 mg total) by mouth 2 (two) times daily. 45 tablet 3  . Guaifenesin (MUCINEX MAXIMUM STRENGTH) 1200 MG TB12 Take 1,200 mg by mouth 2 (two) times daily.    [name] levothyroxine (SYNTHROID) 200 MCG tablet Take 1 tablet (200 mcg total) by mouth daily. 90 tablet 1  . metoprolol tartrate (LOPRESSOR) 25 MG tablet Take 0.5 tablets (12.5 mg total) by mouth as needed (for heart rate above 120 at rest). 30 tablet 3  . OXYGEN Inhale 2-3 L/min into the lungs continuous.     . potassium chloride SA (KLOR-CON) 20 MEQ tablet Take 1 tablet (20 mEq total) by mouth 2 (two) times daily. 60 tablet 1  . traMADol (ULTRAM) 50 MG tablet Take 1 tablet (50 mg total) by mouth daily as needed for moderate pain. (Patient taking differently: Take 50 mg by mouth every 6 (six) hours as needed for moderate pain. ) 90 tablet 1  . vitamin C (ASCORBIC ACID) 500 MG tablet Take 500 mg by mouth daily.     [name] VITAMIN E PO Take 1,000 Units by mouth daily.     [name] omeprazole (PRILOSEC) 40 MG capsule Take 1 capsule (40 mg total) by mouth daily. (Patient not taking: Reported on 11/15/2019) 30 capsule 0   No current facility-administered medications for this visit.    Functional Status:  In  your present state of health, do you have any difficulty performing the following activities: 11/15/2019 11/01/2019  Hearing? N N  Vision? N N  Difficulty concentrating or making decisions? Y Y  Comment pt states she processes things slow -  Walking or climbing stairs? Y Y  Comment uses walker -  Dressing or bathing? N Y  Comment - -  Doing errands, shopping? N Y  Conservation officer, nature and eating ? Y -  Using the Toilet? Y -  In the past six months, have you accidently leaked urine? Y -  Comment pt reports occasional urinary incontinence -  Do you have problems with loss of bowel control? N -  Managing your Medications? N -  Managing your Finances? N -  Housekeeping or managing your Housekeeping? N -  Some recent  data might be hidden    Fall/Depression Screening: Fall Risk  11/15/2019 05/29/2019 06/28/2018  Falls in the past year? 1 0 No  Comment - - -  Number falls in past yr: 0 - -  Comment - - -  Injury with Fall? 0 - -  Risk for fall due to : History of fall(s);Impaired mobility;Impaired vision;Medication side effect - -  Follow up Education provided;Falls evaluation completed - -   PHQ 2/9 Scores 11/15/2019 05/29/2019 06/28/2018 03/22/2018 11/12/2016 05/06/2015 07/25/2013  PHQ - 2 Score 1 1 0 2 1 0 1    Assessment:  THN CM Care Plan Problem One     Most Recent Value  Care Plan Problem One  Patient at risk for hospital readmission  Role Documenting the Problem One  Care Management [place] for Problem One  Active  [facility] Long Term Goal   Patient will have no readmissions within the next 31 days.   THN Long Term Goal Start Date  11/15/19  Interventions for Problem One Long Term Goal  RN CM assessed for any acute issues or concerns. RN CM reviewed action plan with pt. RN CM confirmed pt knows when,how and why to contact MD.    [facility] CM Care Plan Problem Two     Most Recent Value  Care Plan Problem Two  Knowledege deficit related to disease process and mgmt of  HF  Role  Documenting the Problem Two  Care Management Telephonic Coordinator  Care Plan for Problem Two  Active  Interventions for Problem Two Long Term Goal   RNCM educated pt. on HF mgmt, meds, s/s of worsening condition and when to seek medical attention.  THN Long Term Goal  Patient will be able to verbalize at least 3-4 ways to manage HF over the next 31 days.  THN Long Term Goal Start Date  11/15/19  [facility] CM Short Term Goal #1   Patient will report weighing,monitoring and recording  weight daily over the next 30 days.  THN CM Short Term Goal #1 Start Date  11/15/19  Interventions for Short Term Goal #2   RN CM educated pt on proper weighing techniques. s/s of abnormal wgt gain and when to alert MD.       Consent: [facility] services reviewed and discussed with patient. Patient gave verbal consent for services.    Plan: RN CM discussed with patient next outreach within a month. Patient gave verbal consent and in agreement with RN CM follow up and timeframe. Patient aware that they may contact RN CM sooner for any issues or concerns. RN CM will send welcome letter to patient. RN CM will send barriers letter and route encounter to PCP.   Enzo Montgomery, RN,BSN,CCM [facility] [facility] Telephonic Care Management Coordinator Direct Phone: [phone] Toll Free: [phone] Fax: [phone]

## 2019-11-15 NOTE — Patient Outreach (Signed)
[facility]) Care Management  11/15/2019  [name] [name] 1945/10/19 [id number]   CSW made a second attempt to try and contact patient today to perform phone assessment, as well as assess and assist with social work needs and services, without success.  A HIPAA compliant message was left for patient on voicemail.  CSW continues to await a return call.  CSW will make a third and final outreach attempt within the next 3-4 business days, if a return call is not received from patient in the meantime.  CSW will then proceed with case closure if a return call is not received from patient with a total of 10 business days, as required number of phone attempts will have been made and outreach letter mailed.   [name] [name], BSW, MSW, LCSW  Licensed Education officer, environmental Health System  Mailing [place] [address], [place], [place] [address] Physical Address-[address], [place], [place] [address] Toll Free Main # [phone] Fax # [phone] Cell # [phone]  Office # [phone] [email or web address]

## 2019-11-19 ENCOUNTER — Other Ambulatory Visit (INDEPENDENT_AMBULATORY_CARE_PROVIDER_SITE_OTHER): Payer: Medicare HMO

## 2019-11-19 ENCOUNTER — Ambulatory Visit: Payer: Medicare HMO | Attending: Internal Medicine

## 2019-11-19 DIAGNOSIS — I5032 Chronic diastolic (congestive) heart failure: Secondary | ICD-10-CM

## 2019-11-19 DIAGNOSIS — Z23 Encounter for immunization: Secondary | ICD-10-CM

## 2019-11-19 LAB — BASIC METABOLIC PANEL
BUN: 24 mg/dL — ABNORMAL HIGH (ref 6–23)
CO2: 41 mEq/L — ABNORMAL HIGH (ref 19–32)
Calcium: 9.9 mg/dL (ref 8.4–10.5)
Chloride: 95 mEq/L — ABNORMAL LOW (ref 96–112)
Creatinine, Ser: 1.46 mg/dL — ABNORMAL HIGH (ref 0.40–1.20)
GFR: 42.45 mL/min — ABNORMAL LOW (ref >60.00)
Glucose, Bld: 85 mg/dL (ref 70–99)
Potassium: 4.2 mEq/L (ref 3.5–5.1)
Sodium: 142 mEq/L (ref 135–145)

## 2019-11-19 NOTE — Progress Notes (Signed)
   Covid-19 Vaccination Clinic  Name:  [name] [name]    MRN: [id number] DOB: [birth year]  11/19/2019  [name] was observed post Covid-19 immunization for 15 minutes without incidence. She was provided with Vaccine Information Sheet and instruction to access the V-Safe system.   [name] was instructed to call 911 with any severe reactions post vaccine: [name] Difficulty breathing  . Swelling of your face and throat  . A fast heartbeat  . A bad rash all over your body  . Dizziness and weakness    Immunizations Administered    Name Date Dose VIS Date Route   Pfizer COVID-19 Vaccine 11/19/2019 12:09 PM 0.3 mL 08/31/2019 Intramuscular   Manufacturer: [place]   Lot: [id number]   [place]: [address]-5

## 2019-11-21 ENCOUNTER — Encounter: Payer: Self-pay | Admitting: *Deleted

## 2019-11-21 ENCOUNTER — Other Ambulatory Visit: Payer: Self-pay | Admitting: *Deleted

## 2019-11-21 NOTE — Patient Outreach (Addendum)
[place] [facility]) Care Management  11/21/2019  [name] [name] 1945/10/02 [id number]  CSW received an incoming call from patient today, in response to the third HIPAA compliant message left for patient on voicemail by CSW earlier in the day.  CSW was able to perform the initial phone assessment with patient, as well as assess and assist with social work needs and services.  CSW introduced self, explained role and types of services provided through [place] [facility] ([place] [facility]).  CSW further explained to patient that CSW works with patient's Transition of Care Coordinator, [name] [name], also with [place] [facility].  CSW then explained the reason for the call, indicating that [name] [name] thought that patient would benefit from social work services and resources to assist with referrals to various community agencies.  CSW obtained two HIPAA compliant identifiers from patient, which included patient's name and date of birth.  Patient admitted that she is "doing very well, not experiencing any pain from her most recent surgery, and is able to breath a lot better", despite diagnoses of Chronic Obstructive Pulmonary Disease, Chronic Diastolic Congestive Heart Failure and Acute on Chronic Respiratory Failure with Hypoxia and Hypercarbia.  Patient reported that she recently declined home health services through [facility], indicating that she does not need assistance or rehabilitative services, at home or in a skilled nursing facility.  Patient went on to say that she lives at home alone and is able to perform all activities of daily living independently.  Patient stated, "I cook for myself, take a shower daily, sometimes twice a day, depending on the time I get out of bed, clean my own house, wash my own clothes, pay my own bills, and order my own groceries, having them delivered to my home".       Patient verbalized that she is a lot slower at getting things  done, than she once was, but that she definitely takes pride in being able to perform all activities of daily living independently, not having to rely on others to offer assistance.  Patient stated, "I make myself a 'to do list' every morning, because I don't want to sit around here and develop blood clots and die".  Patient admitted to having all of the following durable medical equipment in the home, denying the need for additional items:  CPAP Machine, Hand Held [employer], [place] in shower and around toilet seat, Raised Programme researcher, broadcasting/film/video, Standard Walker, Surveyor, mining, Wheelchair, and three different types of Canes.  Patient also reported having a [place], in the event of a medical emergency.  Patient also denies the need for in-home aide services, nor was patient interested in discussing placement into an assisted living facility.   Patient indicated that her daughter, [name] [name] always makes arrangements to transport her to and from her physician appointments, agreeing to continue to do so, as [name] [name] likes to be patient's "second set of ears".  Patient admitted that she is able to afford to pay for her prescription medications and is capable of setting up her own pill box each month.  Patient stated, "I believe I still have a pretty good social life, to be 75 years of age, I still get together with my girlfriends from the neighborhood and we drink a little wine and play a few games of scrabble, at least twice a month".  CSW commended patient for still being so active and in charge  of her own health and well-being.  Patient reported that she is very blessed and that she has a wonderful support system, not only through family members, friends and neighbors, but also from her congregation members and church community.  Patient admitted to already having her Advanced Directives ([name]) in place, having made her  son, [name] [name] her Pe[place].  CSW will perform a case closure on patient, as all goals of treatment have been met from social work standpoint and no additional social work needs have been identified at this time.  CSW will notify patient's Telephonic RNCM with [place] [facility], [name] [name] of CSW's plans to close patient's case.  CSW will fax an update to patient's Primary Care Physician, [name] [name] to ensure that he is aware of CSW's involvement with patient's plan of care.  CSW was able to confirm that patient has the correct contact information for CSW, encouraging patient to contact CSW directly if additional social work needs arise in the near future.  Patient voiced understanding and was agreeable to this plan.    [name] [name], BSW, MSW, LCSW  Licensed Education officer, environmental Health System  Mailing [place] [address] [place], [place] [address] Physical Address-[address], [place], [place] [address] Toll Free Main # [phone] Fax # [phone] Cell # [phone]  Office # [phone] [name]

## 2019-11-21 NOTE — Patient Outreach (Signed)
[place] [facility]) Care Management  11/21/2019  [name] [name] May 30, 1946 [id number]  CSW made a third and final attempt to try and contact patient today to perform the initial phone assessment, as well as assess and assist with social work needs and services; however, patient was unavailable at the time of CSW's call.  CSW left a HIPAA compliant message on voicemail for patient and continues to await a return call.  CSW will proceed with case closure in the next three business days, on Monday, November 26, 2019, if a return call is not received from patient in the meantime, as required number of phone attempts have been made and outreach letter mailed to patient's home, allowing 10 business days for a response if patient was interested in receiving social work services through [place] with Scientist, clinical (histocompatibility and immunogenetics).  Nat Christen, BSW, MSW, LCSW  Licensed Education officer, environmental Health System  Mailing [place] [address] [place], [place] [address] Physical Address-[address], [place], [place] [address] Toll Free Main # [phone] Fax # [phone] Cell # [phone]  Office # [phone] [email or web address]

## 2019-11-23 ENCOUNTER — Ambulatory Visit (INDEPENDENT_AMBULATORY_CARE_PROVIDER_SITE_OTHER): Payer: Medicare HMO | Admitting: Internal Medicine

## 2019-11-23 ENCOUNTER — Other Ambulatory Visit: Payer: Self-pay | Admitting: Internal Medicine

## 2019-11-23 ENCOUNTER — Other Ambulatory Visit: Payer: Self-pay

## 2019-11-23 ENCOUNTER — Encounter: Payer: Self-pay | Admitting: Internal Medicine

## 2019-11-23 ENCOUNTER — Ambulatory Visit (INDEPENDENT_AMBULATORY_CARE_PROVIDER_SITE_OTHER): Payer: Medicare HMO

## 2019-11-23 VITALS — BP 140/80 | HR 94 | Temp 99.3°F | Wt 277.4 lb

## 2019-11-23 DIAGNOSIS — R509 Fever, unspecified: Secondary | ICD-10-CM | POA: Diagnosis not present

## 2019-11-23 DIAGNOSIS — I5032 Chronic diastolic (congestive) heart failure: Secondary | ICD-10-CM

## 2019-11-23 DIAGNOSIS — I10 Essential (primary) hypertension: Secondary | ICD-10-CM

## 2019-11-23 DIAGNOSIS — N179 Acute kidney failure, unspecified: Secondary | ICD-10-CM

## 2019-11-23 DIAGNOSIS — R7302 Impaired glucose tolerance (oral): Secondary | ICD-10-CM

## 2019-11-23 NOTE — Patient Instructions (Signed)
Ok to have your weight done today, and check your weight every AM at home, write them down and bring to your next appt in 1 week  Ok to decrease the lasix to 1m in the AM only for now, due to the kidneys slowed down too much  Please continue all other medications as before, and refills have been done if requested.  Please have the pharmacy call with any other refills you may need.  Please continue your efforts at being more active, low cholesterol diet, and weight control.  Please keep your appointments with your specialists as you may have planned  Because of the small fever - Please go to the XRAY Department in the first floor for the x-ray testing, and Please go to the LAB at the blood drawing area for the tests to be done (just the urine test today)  You will be contacted by phone if any changes need to be made immediately.  Otherwise, you will receive a letter about your results with an explanation, but please check with MyChart first.  Please remember to sign up for MyChart if you have not done so, as this will be important to you in the future with finding out test results, communicating by private email, and scheduling acute appointments online when needed.  Please make an Appointment to return in 1 week

## 2019-11-23 NOTE — Progress Notes (Addendum)
Subjective:    Patient ID: [name] [name] Number, female    DOB: September 27, 1945, 74 y.o.   MRN: [id number]  HPI  Here for f/u after recent BMP done after she changed her mind about compliance; renal fxn worsened, but states she was not aware she was supposed to reduce her lasix use, c/o generalized weakness, wt is down several lbs overall since last checked; Pt denies chest pain, increased sob or doe, wheezing, orthopnea, PND, increased LE swelling, palpitations, dizziness or syncope, but does have persistent mild LE swelling.   Pt denies fever, wt loss, night sweats, loss of appetite, or other constitutional symptoms  Pt denies new neurological symptoms such as new headache, or facial or extremity weakness or numbness   Pt denies polydipsia, polyuria Wt Readings from Last 3 Encounters:  11/23/19 277 lb 6.4 oz (125.8 kg)  11/06/19 279 lb 1.6 oz (126.6 kg)  10/29/19 293 lb (132.9 kg)   Past Medical History:  Diagnosis Date  . Anemia   . Asthma 06/13/2011   hx of  . Atrial fibrillation ([place])   . Atrial flutter ([place])   . Breast cancer ([place]) dx'd 2005   No BP check or stick in Left arm  . CAD (coronary artery disease)    a. Nonobstructive CAD 2007 (EF 75%.  RCA  proximal 75% tubular, 25% prox LAD, 25% d1, 50% D2) at [facility]. b. NSTEMI 01/2010 in setting of respiratory failure, medical approach (not a good candidate for noninvasive eval)  . Cellulitis    hx of cellulitis in left leg  . Cervical radiculopathy 06/13/2011  . CHF (congestive heart failure) ([place])   . Complication of anesthesia    per patient "hard to wake up and come out of the aneshthesia, happened years ago"  . COPD (chronic obstructive pulmonary disease) ([place])    on O2  . Depression 06/13/2011  . Diverticulosis   . Esophageal dysmotility   . Fatty liver   . Gout 06/13/2011  . History of Clostridium difficile early dec 2015   no current diarrhea  . History of home oxygen therapy    2 liters per nasal cannula all the time  . Hx of  dysfunctional uterine bleeding    last bleeding jan 2016, worked up for with d and c x 2 no cause found  . Hyperlipidemia   . Hypertension   . Hypothyroidism   . Impaired glucose tolerance 11/01/2012  . Myocardial infarction ([place])   . Neuropathy 06/13/2011  . Neuropathy    per patient in hands and in feet  . Obesity hypoventilation syndrome ([place]) 06/13/2011  . OSA on CPAP   . Personal history of chemotherapy   . Personal history of radiation therapy   . PFO (patent foramen ovale)   . Presence of permanent cardiac pacemaker   . Risk for falls   . Syncope and collapse    Bradycardia with pauses. S/P pacemaker  . Tubular adenoma of colon    Past Surgical History:  Procedure Laterality Date  . APPENDECTOMY    . BREAST BIOPSY    . BREAST LUMPECTOMY Left 05/2004  . CARDIOVERSION N/A 03/15/2013   Procedure: CARDIOVERSION;  Surgeon: Thayer Headings, MD;  Location: [facility] ENDOSCOPY;  Service: Cardiovascular;  Laterality: N/A;  . CERVICAL BIOPSY  June 2013   at [facility] for Heavy  Bleeding  . [place] SURGERY  2004 or 2005   have cadaver bones,, screws, and plates c 5 to c 6  . COLONOSCOPY    .  COLONOSCOPY WITH PROPOFOL N/A 10/22/2014   Procedure: COLONOSCOPY WITH PROPOFOL;  Surgeon: Jerene Bears, MD;  Location: [facility] ENDOSCOPY;  Service: Gastroenterology;  Laterality: N/A;  . DILATION AND CURETTAGE OF UTERUS    . EPIGASTRIC HERNIA REPAIR    . EPIGASTRIC HERNIA REPAIR N/A 10/29/2019   Procedure: EPIGASTRIC HERNIA REPAIR WITH MESH;  Surgeon: Coralie Keens, MD;  Location: [facility];  Service: General;  Laterality: N/A;  LMA ANESTHESIA  . EYE SURGERY    . LAPAROSCOPIC APPENDECTOMY  03/29/2012   Procedure: APPENDECTOMY LAPAROSCOPIC;  Surgeon: Adin Hector, MD;  Location: [facility] ORS;  Service: General;  Laterality: N/A;  . LOOP RECORDER EXPLANT  08/31/2013   Procedure: LOOP RECORDER EXPLANT;  Surgeon: Coralyn Mark, MD;  Location: [facility] CATH LAB;  Service: Cardiovascular;;  . LOOP RECORDER IMPLANT   08-28-2013; 08-31-2013   MDT LinQ implanted by Dr Rayann Heman for syncope; explanted 08-31-2013 after sinus pauses identified  . LOOP RECORDER IMPLANT N/A 08/28/2013   Procedure: LOOP RECORDER IMPLANT;  Surgeon: Coralyn Mark, MD;  Location: [place] CATH LAB;  Service: Cardiovascular;  Laterality: N/A;  . OVARIAN CYST REMOVAL    . PACEMAKER INSERTION  08-31-2013   Biotronik Evera dual chamber pacemaker placed for neurocardiogenic syncope and sinus pauses by Dr Rayann Heman  . PERMANENT PACEMAKER INSERTION N/A 08/31/2013   Procedure: PERMANENT PACEMAKER INSERTION;  Surgeon: Coralyn Mark, MD;  Location: [facility] CATH LAB;  Service: Cardiovascular;  Laterality: N/A;  . TEE WITHOUT CARDIOVERSION N/A 03/15/2013   Procedure: TRANSESOPHAGEAL ECHOCARDIOGRAM (TEE);  Surgeon: Thayer Headings, MD;  Location: [facility] ENDOSCOPY;  Service: Cardiovascular;  Laterality: N/A;    reports that she quit smoking about 21 years ago. Her smoking use included cigarettes. She has a 20.00 pack-year smoking history. She has never used smokeless tobacco. She reports current alcohol use. She reports that she does not use drugs. family history includes Breast cancer in her maternal aunt; Breast cancer (age of onset: 69) in her sister; Breast cancer (age of onset: 12) in her cousin; Diabetes in her brother; Heart disease in her mother; Ovarian cancer in her sister and sister; Uterine cancer in her mother. Allergies  Allergen Reactions  . Cephalexin Other (See Comments)    Exfoliative dermatitis reaction  . Ciprofloxacin Other (See Comments)    Folliculitis   . Lisinopril Cough  . Codeine     REACTION: unknown - pt. states unaware of having reaction to codeine  . Latex Dermatitis  . Tape Dermatitis  . Cleocin [Clindamycin Hcl] Rash  . Dilaudid [Hydromorphone] Other (See Comments)    Oversedation  . Doxycycline Rash  . Penicillins Rash    Did it involve swelling of the face/tongue/throat, SOB, or low BP? No Did it involve sudden or severe  rash/hives, skin peeling, or any reaction on the inside of your mouth or nose? Yes Did you need to seek medical attention at a hospital or doctor's office?Yes When did it last happen?several years ago If all above answers are "NO", may proceed with cephalosporin use.   Current Outpatient Medications on File Prior to Visit  Medication Sig Dispense Refill  . albuterol (PROAIR HFA) 108 (90 Base) MCG/ACT inhaler Inhale 2 puffs into the lungs See admin instructions. Inhale 2 puffs into the lungs in the morning, 2 puffs at bedtime, and one to two times daily as needed for shortness of breath or wheezing    . allopurinol (ZYLOPRIM) 300 MG tablet TAKE 1 TABLET DAILY (Patient taking differently: Take 300  mg by mouth daily. ) 90 tablet 1  . amitriptyline (ELAVIL) 100 MG tablet Take 1 tablet (100 mg total) by mouth at bedtime. 90 tablet 1  . apixaban (ELIQUIS) 5 MG TABS tablet Take 1 tablet (5 mg total) by mouth 2 (two) times daily. 180 tablet 1  . atorvastatin (LIPITOR) 40 MG tablet TAKE 1 TABLET DAILY (Patient taking differently: Take 40 mg by mouth at bedtime. ) 90 tablet 4  . B Complex Vitamins (VITAMIN B COMPLEX PO) Take 1 tablet by mouth daily.    . budesonide-formoterol (SYMBICORT) 160-4.5 MCG/ACT inhaler Inhale 2 puffs into the lungs 2 (two) times daily. 30.6 g 2  . cholecalciferol (VITAMIN D3) 25 MCG (1000 UNIT) tablet Take 2,000 Units by mouth daily.    . clonazePAM (KLONOPIN) 0.5 MG tablet Take 1 tablet (0.5 mg total) by mouth daily as needed for anxiety. 90 tablet 1  . DULoxetine (CYMBALTA) 30 MG capsule Take 2 capsules (60 mg total) by mouth at bedtime. 180 capsule 1  . ferrous sulfate 325 (65 FE) MG tablet Take 325 mg by mouth at bedtime.    . furosemide (LASIX) 40 MG tablet Take 1 tablet (40 mg total) by mouth 2 (two) times daily. 45 tablet 3  . Guaifenesin (MUCINEX MAXIMUM STRENGTH) 1200 MG TB12 Take 1,200 mg by mouth 2 (two) times daily.    [name] levothyroxine (SYNTHROID) 200 MCG tablet  Take 1 tablet (200 mcg total) by mouth daily. 90 tablet 1  . metoprolol tartrate (LOPRESSOR) 25 MG tablet Take 0.5 tablets (12.5 mg total) by mouth as needed (for heart rate above 120 at rest). 30 tablet 3  . omeprazole (PRILOSEC) 40 MG capsule Take 1 capsule (40 mg total) by mouth daily. 30 capsule 0  . OXYGEN Inhale 2-3 L/min into the lungs continuous.     . potassium chloride SA (KLOR-CON) 20 MEQ tablet Take 1 tablet (20 mEq total) by mouth 2 (two) times daily. 60 tablet 1  . traMADol (ULTRAM) 50 MG tablet Take 1 tablet (50 mg total) by mouth daily as needed for moderate pain. (Patient taking differently: Take 50 mg by mouth every 6 (six) hours as needed for moderate pain. ) 90 tablet 1  . vitamin C (ASCORBIC ACID) 500 MG tablet Take 500 mg by mouth daily.     [name] VITAMIN E PO Take 1,000 Units by mouth daily.     . AZO-CRANBERRY PO Take 1 tablet by mouth daily as needed (urinary symptoms).      No current facility-administered medications on file prior to visit.   Review of Systems All otherwise neg per pt     Objective:   Physical Exam BP 140/80   Pulse 94   Temp 99.3 F (37.4 C)   Wt 277 lb 6.4 oz (125.8 kg)   SpO2 98%   BMI 50.74 kg/m  VS noted,  Constitutional: Pt appears in NAD HENT: Head: NCAT.  Right Ear: External ear normal.  Left Ear: External ear normal.  Eyes: . Pupils are equal, round, and reactive to light. Conjunctivae and EOM are normal Nose: without d/c or deformity Neck: Neck supple. Gross normal ROM Cardiovascular: Normal rate and regular rhythm.   Pulmonary/Chest: Effort normal and breath sounds without rales or wheezing.  Abd:  Soft, NT, ND, + BS, no organomegaly Neurological: Pt is alert. At baseline orientation, motor grossly intact Skin: Skin is warm. No rashes, other new lesions, trace bilat LE edema Psychiatric: Pt behavior is normal without agitation  All otherwise neg per pt Lab Results  Component Value Date   WBC 7.7 11/03/2019   HGB 12.0  11/03/2019   HCT 40.7 11/03/2019   PLT 179 11/03/2019   GLUCOSE 85 11/19/2019   CHOL 139 11/24/2017   TRIG 137 11/24/2017   HDL 48 11/24/2017   LDLCALC 64 11/24/2017   ALT 20 11/01/2019   AST 31 11/01/2019   NA 142 11/19/2019   K 4.2 11/19/2019   CL 95 (L) 11/19/2019   CREATININE 1.46 (H) 11/19/2019   BUN 24 (H) 11/19/2019   CO2 41 (H) 11/19/2019   TSH 3.536 05/03/2019   INR 1.7 (H) 11/01/2019   HGBA1C 5.2 05/03/2019      Assessment & Plan:

## 2019-11-24 ENCOUNTER — Encounter: Payer: Self-pay | Admitting: Internal Medicine

## 2019-11-24 DIAGNOSIS — R509 Fever, unspecified: Secondary | ICD-10-CM | POA: Insufficient documentation

## 2019-11-24 NOTE — Assessment & Plan Note (Signed)
stable overall by history and exam, recent data reviewed with pt, and pt to continue medical treatment as before,  to f/u any worsening symptoms or concerns  

## 2019-11-24 NOTE — Assessment & Plan Note (Signed)
Asympt, no overt source,  to f/u any worsening symptoms or concerns, for cxr

## 2019-11-24 NOTE — Assessment & Plan Note (Signed)
As above, likely needs PM lasix dose prn in addition to am dosing will asses next visti

## 2019-11-24 NOTE — Assessment & Plan Note (Addendum)
Most likely related to overdiuresis, to reduce lasix 40 qam, check daily wts, declines lab work today, for lab testing next visit 1 wk  I spent 40 minutes in preparing to see the patient by review of recent labs, imaging and procedures, obtaining and reviewing separately obtained history, communicating with the patient and family or caregiver, ordering medications, tests or procedures, and documenting clinical information in the EHR including the differential Dx, treatment, and any further evaluation and other management of AKI, low grade temp, CHF, hyperglycemia, HTN

## 2019-11-26 ENCOUNTER — Telehealth: Payer: Self-pay | Admitting: Internal Medicine

## 2019-11-26 DIAGNOSIS — R509 Fever, unspecified: Secondary | ICD-10-CM | POA: Diagnosis not present

## 2019-11-26 LAB — URINALYSIS, ROUTINE W REFLEX MICROSCOPIC
Bilirubin Urine: NEGATIVE
Hgb urine dipstick: NEGATIVE
Ketones, ur: NEGATIVE
Leukocytes,Ua: NEGATIVE
Nitrite: NEGATIVE
Specific Gravity, Urine: 1.025 (ref 1.000–1.030)
Total Protein, Urine: >=300 — AB
Urine Glucose: NEGATIVE
Urobilinogen, UA: 0.2 (ref 0.0–1.0)
pH: 6 (ref 5.0–8.0)

## 2019-11-26 NOTE — Telephone Encounter (Signed)
I called number listed for pt dtr and vm box was full. Not able to leave a message.   Called number for pt and spoke to pt. Pt stated that she is urinating but it is not quite as much and there is not complete emptying.

## 2019-11-26 NOTE — Addendum Note (Signed)
Addended by: Trenda Moots on: 7/0/1100 08:25 AM   Modules accepted: Orders

## 2019-11-26 NOTE — Telephone Encounter (Signed)
New message:   Pt's daughter is calling and stated she submitted a urine sample this morning for the patient. She states the patient has been unable to urinate since then and has had 3 bottles of water and states her weight is going up. Please advise.

## 2019-11-27 ENCOUNTER — Other Ambulatory Visit: Payer: Self-pay

## 2019-11-27 DIAGNOSIS — I10 Essential (primary) hypertension: Secondary | ICD-10-CM | POA: Diagnosis not present

## 2019-11-27 DIAGNOSIS — G4733 Obstructive sleep apnea (adult) (pediatric): Secondary | ICD-10-CM | POA: Diagnosis not present

## 2019-11-27 LAB — URINE CULTURE

## 2019-11-27 NOTE — Telephone Encounter (Signed)
UA neg, no sign of infection  I can refer to urology if the pt wants

## 2019-11-27 NOTE — Patient Outreach (Signed)
[facility]) Care Management  11/27/2019  [name] [name] [birth year] [id number]   Telephone Assessment    Outreach attempt to patient. No answer. RN CM left HIPAA compliant voicemail message along with contact info.    Plan: RN CM will make outreach attempt to patient within 3-4 business days if no return call.   Enzo Montgomery, RN,BSN,CCM [place] Management Telephonic Care Management Coordinator Direct Phone: [phone] Toll Free: [phone] Fax: [phone]

## 2019-11-29 ENCOUNTER — Other Ambulatory Visit: Payer: Self-pay

## 2019-11-29 NOTE — Patient Outreach (Signed)
[facility]) Care Management  11/29/2019  KIRSTAN FENTRESS [birth year] [id number]   Telephone Assessment    Outreach attempt #2 to patient. No answer. RN CM left HIPAA compliant voicemail message along with contact info.      Plan: RN CM will make outreach attempt to patient within 3-4 business days. RN CM will send unsuccessful outreach letter to patient.   Enzo Montgomery, RN,BSN,CCM [place] Management Telephonic Care Management Coordinator Direct Phone: [phone] Toll Free: [phone] Fax: [phone]

## 2019-11-30 ENCOUNTER — Ambulatory Visit: Payer: Self-pay

## 2019-12-02 ENCOUNTER — Encounter: Payer: Self-pay | Admitting: Internal Medicine

## 2019-12-03 ENCOUNTER — Ambulatory Visit: Payer: Medicare HMO

## 2019-12-04 ENCOUNTER — Other Ambulatory Visit: Payer: Self-pay

## 2019-12-04 NOTE — Progress Notes (Deleted)
HPI: FU atrial flutter. Also with hx of CAD, HTN, HL, OSA, hypothyroidism, COPD on O2, L breast CA. LHC at [facility] 10/2005: pRCA 75%, LM <25%, pLAD 25%, D1 25%, D2 50%. Carotid Dopplers June 2013 showed no significant stenosis. She suffered a NSTEMI in 01/2010 in the setting of SIRS from leg cellulitis. This was felt to be demand ischemia. Med Rx was pursued (felt to be a poor candidate for noninvasive imaging). Patient had atrial flutter with RVR 6/14. She was felt to be a poor candidate for atrial flutter ablation. TEE guided cardioversion was performed. She had restoration of NSR. TEE 03/15/13: EF 55-60%, no LA clot, positive PFO, mild to moderate TR. Patient admitted in December of 2014 following a syncopal episode. She had an implantable loop placed which demonstrated symptomatic bradycardia with pauses and ultimately had a pacemaker placed.Also with diastolic CHF.Echocardiogram August 2019 showed ejection fraction 55 to 71%, mild diastolic dysfunction, mild to moderate aortic insufficiency, mild to moderate mitral regurgitation, mild to moderate tricuspid regurgitation and mild left atrial enlargement. Had DCCV of atrial flutter in the emergency room August 2020.  Patient admitted 5 days following hernia surgery with altered mental status February 2021.  She was found to have acute kidney injury.  She was treated with IV diuretics with improvement.  Since last seen,  Current Outpatient Medications  Medication Sig Dispense Refill  . albuterol (PROAIR HFA) 108 (90 Base) MCG/ACT inhaler Inhale 2 puffs into the lungs See admin instructions. Inhale 2 puffs into the lungs in the morning, 2 puffs at bedtime, and one to two times daily as needed for shortness of breath or wheezing    . allopurinol (ZYLOPRIM) 300 MG tablet TAKE 1 TABLET DAILY (Patient taking differently: Take 300 mg by mouth daily. ) 90 tablet 1  . amitriptyline (ELAVIL) 100 MG tablet Take 1 tablet (100 mg total) by mouth at bedtime. 90  tablet 1  . apixaban (ELIQUIS) 5 MG TABS tablet Take 1 tablet (5 mg total) by mouth 2 (two) times daily. 180 tablet 1  . atorvastatin (LIPITOR) 40 MG tablet TAKE 1 TABLET DAILY (Patient taking differently: Take 40 mg by mouth at bedtime. ) 90 tablet 4  . AZO-CRANBERRY PO Take 1 tablet by mouth daily as needed (urinary symptoms).     . B Complex Vitamins (VITAMIN B COMPLEX PO) Take 1 tablet by mouth daily.    . budesonide-formoterol (SYMBICORT) 160-4.5 MCG/ACT inhaler Inhale 2 puffs into the lungs 2 (two) times daily. 30.6 g 2  . cholecalciferol (VITAMIN D3) 25 MCG (1000 UNIT) tablet Take 2,000 Units by mouth daily.    . clonazePAM (KLONOPIN) 0.5 MG tablet Take 1 tablet (0.5 mg total) by mouth daily as needed for anxiety. 90 tablet 1  . DULoxetine (CYMBALTA) 30 MG capsule Take 2 capsules (60 mg total) by mouth at bedtime. 180 capsule 1  . ferrous sulfate 325 (65 FE) MG tablet Take 325 mg by mouth at bedtime.    . furosemide (LASIX) 40 MG tablet Take 1 tablet (40 mg total) by mouth 2 (two) times daily. 45 tablet 3  . Guaifenesin (MUCINEX MAXIMUM STRENGTH) 1200 MG TB12 Take 1,200 mg by mouth 2 (two) times daily.    [name] levothyroxine (SYNTHROID) 200 MCG tablet Take 1 tablet (200 mcg total) by mouth daily. 90 tablet 1  . metoprolol tartrate (LOPRESSOR) 25 MG tablet Take 0.5 tablets (12.5 mg total) by mouth as needed (for heart rate above 120 at rest). [place]  tablet 3  . omeprazole (PRILOSEC) 40 MG capsule Take 1 capsule (40 mg total) by mouth daily. 30 capsule 0  . OXYGEN Inhale 2-3 L/min into the lungs continuous.     . potassium chloride SA (KLOR-CON) 20 MEQ tablet Take 1 tablet (20 mEq total) by mouth 2 (two) times daily. 60 tablet 1  . traMADol (ULTRAM) 50 MG tablet Take 1 tablet (50 mg total) by mouth daily as needed for moderate pain. (Patient taking differently: Take 50 mg by mouth every 6 (six) hours as needed for moderate pain. ) 90 tablet 1  . vitamin C (ASCORBIC ACID) 500 MG tablet Take 500 mg by  mouth daily.     [name] VITAMIN E PO Take 1,000 Units by mouth daily.      No current facility-administered medications for this visit.     Past Medical History:  Diagnosis Date  . Anemia   . Asthma 06/13/2011   hx of  . Atrial fibrillation ([place])   . Atrial flutter ([place])   . Breast cancer ([place]) dx'd 2005   No BP check or stick in Left arm  . CAD (coronary artery disease)    a. Nonobstructive CAD 2007 (EF 75%.  RCA  proximal 75% tubular, 25% prox LAD, 25% d1, 50% D2) at [facility] b. NSTEMI 01/2010 in setting of respiratory failure, medical approach (not a good candidate for noninvasive eval)  . Cellulitis    hx of cellulitis in left leg  . Cervical radiculopathy 06/13/2011  . CHF (congestive heart failure) ([place])   . Complication of anesthesia    per patient "hard to wake up and come out of the aneshthesia, happened years ago"  . COPD (chronic obstructive pulmonary disease) ([place])    on O2  . Depression 06/13/2011  . Diverticulosis   . Esophageal dysmotility   . Fatty liver   . Gout 06/13/2011  . History of Clostridium difficile early dec 2015   no current diarrhea  . History of home oxygen therapy    2 liters per nasal cannula all the time  . Hx of dysfunctional uterine bleeding    last bleeding jan 2016, worked up for with d and c x 2 no cause found  . Hyperlipidemia   . Hypertension   . Hypothyroidism   . Impaired glucose tolerance 11/01/2012  . Myocardial infarction ([place])   . Neuropathy 06/13/2011  . Neuropathy    per patient in hands and in feet  . Obesity hypoventilation syndrome ([place]) 06/13/2011  . OSA on CPAP   . Personal history of chemotherapy   . Personal history of radiation therapy   . PFO (patent foramen ovale)   . Presence of permanent cardiac pacemaker   . Risk for falls   . Syncope and collapse    Bradycardia with pauses. S/P pacemaker  . Tubular adenoma of colon     Past Surgical History:  Procedure Laterality Date  . APPENDECTOMY    . BREAST BIOPSY    .  BREAST LUMPECTOMY Left 05/2004  . CARDIOVERSION N/A 03/15/2013   Procedure: CARDIOVERSION;  Surgeon: Thayer Headings, MD;  Location[facility] ENDOSCOPY;  Service: Cardiovascular;  Laterality: N/A;  . CERVICAL BIOPSY  June 2013   at [facility] for Heavy  Bleeding  . Daisy SURGERY  2004 or 2005   have cadaver bones,, screws, and plates c 5 to c 6  . COLONOSCOPY    . COLONOSCOPY WITH PROPOFOL N/A 10/22/2014   Procedure: COLONOSCOPY WITH PROPOFOL;  Surgeon: Jerene Bears, MD;  Location: [facility] ENDOSCOPY;  Service: Gastroenterology;  Laterality: N/A;  . DILATION AND CURETTAGE OF UTERUS    . EPIGASTRIC HERNIA REPAIR    . EPIGASTRIC HERNIA REPAIR N/A 10/29/2019   Procedure: EPIGASTRIC HERNIA REPAIR WITH MESH;  Surgeon: Coralie Keens, MD;  Location: [facility];  Service: General;  Laterality: N/A;  LMA ANESTHESIA  . EYE SURGERY    . LAPAROSCOPIC APPENDECTOMY  03/29/2012   Procedure: APPENDECTOMY LAPAROSCOPIC;  Surgeon: Adin Hector, MD;  Location: [facility] ORS;  Service: General;  Laterality: N/A;  . LOOP RECORDER EXPLANT  08/31/2013   Procedure: LOOP RECORDER EXPLANT;  Surgeon: Coralyn Mark, MD;  Location: [facility] CATH LAB;  Service: Cardiovascular;;  . LOOP RECORDER IMPLANT  08-28-2013; 08-31-2013   MDT LinQ implanted by Dr Rayann Heman for syncope; explanted 08-31-2013 after sinus pauses identified  . LOOP RECORDER IMPLANT N/A 08/28/2013   Procedure: LOOP RECORDER IMPLANT;  Surgeon: Coralyn Mark, MD;  Location: [facility] CATH LAB;  Service: Cardiovascular;  Laterality: N/A;  . OVARIAN CYST REMOVAL    . PACEMAKER INSERTION  08-31-2013   Biotronik Evera dual chamber pacemaker placed for neurocardiogenic syncope and sinus pauses by Dr Rayann Heman  . PERMANENT PACEMAKER INSERTION N/A 08/31/2013   Procedure: PERMANENT PACEMAKER INSERTION;  Surgeon: Coralyn Mark, MD;  Location: [facility] CATH LAB;  Service: Cardiovascular;  Laterality: N/A;  . TEE WITHOUT CARDIOVERSION N/A 03/15/2013   Procedure: TRANSESOPHAGEAL ECHOCARDIOGRAM (TEE);  Surgeon:  Thayer Headings, MD;  Location: [facility] ENDOSCOPY;  Service: Cardiovascular;  Laterality: N/A;    Social History   Socioeconomic History  . Marital status: Divorced    Spouse name: Not on file  . Number of children: 2  . Years of education: Not on file  . Highest education level: Not on file  Occupational History  . Occupation: Retired  Tobacco Use  . Smoking status: Former Smoker    Packs/day: 1.00    Years: 20.00    Pack years: 20.00    Types: Cigarettes    Quit date: 09/20/1998    Years since quitting: 21.2  . Smokeless tobacco: Never Used  . Tobacco comment: 1ppd x 20 years  Substance and Sexual Activity  . Alcohol use: Yes    Comment: rare, on holidays, and on special occasions per patient on 10/25/2019  . Drug use: No  . Sexual activity: Not Currently    Birth control/protection: Post-menopausal  Other Topics Concern  . Not on file  Social History Narrative   Lives alone in an apartment on the first floor.   Has 2 children.  Retired Advertising copywriter.  Education: some college.    Social Determinants of Health   Financial Resource Strain: Low Risk   . Difficulty of Paying Living Expenses: Not very hard  Food Insecurity: No Food Insecurity  . Worried About Charity fundraiser in the Last Year: Never true  . Ran Out of Food in the Last Year: Never true  Transportation Needs: No Transportation Needs  . Lack of Transportation (Medical): No  . Lack of Transportation (Non-Medical): No  Physical Activity: Inactive  . Days of Exercise per Week: 0 days  . Minutes of Exercise per Session: 0 min  Stress: No Stress Concern Present  . Feeling of Stress : Only a little  Social Connections: Slightly Isolated  . Frequency of Communication with Friends and Family: More than three times a week  . Frequency of Social Gatherings with Friends and Family: More  than three times a week  . Attends Religious Services: More than 4 times per year  . Active Member of Clubs or Organizations:  Yes  . Attends Archivist Meetings: More than 4 times per year  . Marital Status: Divorced  Human resources officer Violence: Not At Risk  . Fear of Current or Ex-Partner: No  . Emotionally Abused: No  . Physically Abused: No  . Sexually Abused: No    Family History  Problem Relation Age of Onset  . Uterine cancer Mother   . Heart disease Mother   . Breast cancer Sister 79  . Ovarian cancer Sister   . Breast cancer Cousin 21  . Diabetes Brother   . Breast cancer Maternal Aunt   . Ovarian cancer Sister     ROS: no fevers or chills, productive cough, hemoptysis, dysphasia, odynophagia, melena, hematochezia, dysuria, hematuria, rash, seizure activity, orthopnea, PND, pedal edema, claudication. Remaining systems are negative.  Physical Exam: Well-developed well-nourished in no acute distress.  Skin is warm and dry.  HEENT is normal.  Neck is supple.  Chest is clear to auscultation with normal expansion.  Cardiovascular exam is regular rate and rhythm.  Abdominal exam nontender or distended. No masses palpated. Extremities show no edema. neuro grossly intact  ECG- personally reviewed  A/P  1 chronic diastolic congestive heart failure-her volume status is stable today.  Continue Lasix at present dose.  Check potassium and renal function.  2 coronary artery disease-she has not had chest pain.  Continue medical therapy with Lipitor.  She is not on aspirin given need for anticoagulation.  3 history of atrial flutter-patient remains in sinus rhythm today.  Her beta-blocker was discontinued previously because of hypotension.  Continue apixaban.  4 hypertension-following loss of 100 pounds her blood pressure decreased and her metoprolol was discontinued.  Blood pressure now controlled.  5 hyperlipidemia-continue statin.  6 aortic insufficiency-we will plan follow-up echocardiogram.  7 prior pacemaker-Per electrophysiology.  Kirk Ruths, MD

## 2019-12-04 NOTE — Patient Outreach (Signed)
[facility] [address]) Care Management  12/04/2019  [name] [name] [birth year] [id number]   Telephone Assessment  Voicemail message received from patient. Return call placed to patient. She shares that she continues to have some "issues with her daughter." She reports that she has decided to no longer allow her daughter to be involved in her affairs. She reports that she contacted MD office a few days ago to advise them of this. She reports that her and her daughter had a 'big argument over the weekend" which led her to this decision. Patient adamant that she does not want daughter in her medical or financial affairs. She reports that she wants to get all her legal documents changed. RN CM instructed patient on how to go about doing this. Also, encouraged her to consider talking to elder law specialist to provide some insight and assistance. She will follow up. She states that her main goal is get this taken care of as the situation has bene affecting her health. She denies any other RN CM needs or concerns at this time.     Plan: RN CM discussed with patient next outreach within the month of April. Patient gave verbal consent and in agreement with RN CM follow up and timeframe. Patient aware that they may contact RN CM sooner for any issues or concerns.    Enzo Montgomery, RN,BSN,CCM [facility] Telephonic Care Management Coordinator Direct Phone: [phone] Toll Free: [phone] Fax: [phone]

## 2019-12-05 ENCOUNTER — Ambulatory Visit: Payer: Self-pay

## 2019-12-08 DIAGNOSIS — J449 Chronic obstructive pulmonary disease, unspecified: Secondary | ICD-10-CM | POA: Diagnosis not present

## 2019-12-10 NOTE — Telephone Encounter (Signed)
The daughter Latoia Eyster) is now requesting FMLA to care for her patient when she needs and appointment.   Dr.John please see message below patient sent. I am unsure how much the daughter is helping with the patients needs. The daughter also lives out of town. And The patient wants the daughter removed from her contact, etc. Please advise.

## 2019-12-12 ENCOUNTER — Ambulatory Visit: Payer: Medicare HMO | Admitting: Cardiology

## 2019-12-13 ENCOUNTER — Other Ambulatory Visit: Payer: Self-pay | Admitting: Internal Medicine

## 2019-12-14 ENCOUNTER — Encounter: Payer: Self-pay | Admitting: Internal Medicine

## 2019-12-14 MED ORDER — CLONAZEPAM 0.5 MG PO TABS: 0.5000 mg | ORAL_TABLET | Freq: Two times a day (BID) | ORAL | 2 refills | Status: DC | PRN

## 2019-12-17 ENCOUNTER — Other Ambulatory Visit: Payer: Self-pay | Admitting: Internal Medicine

## 2019-12-17 ENCOUNTER — Encounter: Payer: Self-pay | Admitting: Internal Medicine

## 2019-12-18 ENCOUNTER — Ambulatory Visit: Payer: Medicare HMO | Attending: Internal Medicine

## 2019-12-18 DIAGNOSIS — Z23 Encounter for immunization: Secondary | ICD-10-CM

## 2019-12-18 DIAGNOSIS — R6889 Other general symptoms and signs: Secondary | ICD-10-CM | POA: Diagnosis not present

## 2019-12-18 NOTE — Progress Notes (Signed)
   Covid-19 Vaccination Clinic  Name:  [name] [name]    MRN: [id number] DOB: 1946/09/15  12/18/2019  [name] was observed post Covid-19 immunization for 15 minutes without incident. She was provided with Vaccine Information Sheet and instruction to access the V-Safe system.   [name] was instructed to call 911 with any severe reactions post vaccine: [name] Difficulty breathing  . Swelling of face and throat  . A fast heartbeat  . A bad rash all over body  . Dizziness and weakness   Immunizations Administered    Name Date Dose VIS Date Route   Pfizer COVID-19 Vaccine 12/18/2019 11:41 AM 0.3 mL 08/31/2019 Intramuscular   Manufacturer: [place]   Lot: [id number]   [place]: [address]-9

## 2019-12-19 MED ORDER — CLONAZEPAM 0.5 MG PO TABS: 0.5000 mg | ORAL_TABLET | Freq: Two times a day (BID) | ORAL | 1 refills | Status: AC | PRN

## 2019-12-21 ENCOUNTER — Telehealth: Payer: Self-pay

## 2019-12-21 NOTE — Telephone Encounter (Signed)
The pt states she been sleeping near the monitor. She is going to check to make sure it is plugged all the way in. She will try to figure it out.

## 2019-12-21 NOTE — Telephone Encounter (Signed)
LMOVM for the pt to call my direct office number.

## 2019-12-22 ENCOUNTER — Encounter: Payer: Self-pay | Admitting: Internal Medicine

## 2019-12-25 ENCOUNTER — Encounter: Payer: Self-pay | Admitting: Internal Medicine

## 2019-12-25 NOTE — Telephone Encounter (Signed)
LMOVM that the pt monitor still not connecting to [employer]. I left the number to [employer] and my direct office number in case she has questions.

## 2019-12-26 ENCOUNTER — Ambulatory Visit: Payer: Medicare HMO | Admitting: Internal Medicine

## 2019-12-26 DIAGNOSIS — Z0289 Encounter for other administrative examinations: Secondary | ICD-10-CM

## 2019-12-26 NOTE — Progress Notes (Deleted)
Subjective:    Patient ID: [name] [name], female    DOB: 1945/11/03, 74 y.o.   MRN: [id number]  HPI The patient is here for an acute visit.   Leg cellulitis -   Medications and allergies reviewed with patient and updated if appropriate.  Patient Active Problem List   Diagnosis Date Noted  . Low grade fever 11/24/2019  . Acute encephalopathy 11/02/2019  . Ileus ([place]) 11/01/2019  . Epigastric hernia 10/29/2019  . AKI (acute kidney injury) ([place]) 05/04/2019  . Hypotension 05/03/2019  . Anxiety 03/22/2018  . COPD with acute exacerbation ([place]) 12/28/2017  . Chronic respiratory failure with hypoxia and hypercapnia ([place]) 11/04/2016  . Tinea 09/26/2016  . Dyspnea on exertion 09/21/2016  . Morbid (severe) obesity due to excess calories ([place]) 09/21/2016  . History of left breast cancer 06/08/2016  . Panic attacks 04/27/2016  . Chest pain 04/11/2016  . OSA (obstructive sleep apnea) 02/12/2016  . Adhesive capsulitis of right shoulder 08/21/2015  . Right shoulder pain 07/16/2015  . Osteoarthritis, hand 07/16/2015  . Hair loss 07/16/2015  . Blood in mouth of unknown source 05/06/2015  . Dysphagia, pharyngoesophageal phase 05/06/2015  . Chronic respiratory failure ([place]) 10/28/2014  . Cellulitis of female breast 10/25/2014  . Rash and nonspecific skin eruption 10/25/2014  . Hx of colonic polyps   . Benign neoplasm of cecum   . Benign neoplasm of transverse colon   . CHF (congestive heart failure) ([place])   . History of Clostridium difficile 06/27/2014  . RUQ pain 05/31/2014  . Lump in the abdomen 05/31/2014  . Diarrhea 05/14/2014  . Abdominal pain, other specified site 05/14/2014  . Pacemaker 12/03/2013  . Exfoliative dermatitis 11/27/2013  . Urinary urgency 11/13/2013  . Post-menopausal bleeding 11/13/2013  . Syncope and collapse 08/30/2013  . Atrial fibrillation ([place])   . Syncope 08/26/2013  . Postmenopausal vaginal bleeding 05/02/2013  . Tachy-brady syndrome ([place])  03/15/2013  . Acute on chronic diastolic CHF (congestive heart failure) ([place]) 03/15/2013  . PFO (patent foramen ovale) 03/15/2013  . Hypokalemia 03/15/2013  . Atrial flutter ([place]) 03/01/2013  . Tachycardia 03/01/2013  . Anticoagulant long-term use 03/01/2013  . Impaired glucose tolerance 11/01/2012  . Abnormal liver function tests 11/01/2012  . Right lumbar radiculopathy 04/28/2012  . Cough 04/15/2012  . Abnormal TSH 04/15/2012  . Acute appendicitis 03/29/2012  . Sick sinus syndrome ([place]) 03/08/2012  . Morbid obesity with BMI of 50.0-59.9, adult ([place]) 03/08/2012  . Breast cancer ([place]) 06/13/2011  . Asthma 06/13/2011  . Depression 06/13/2011  . Colon polyps 06/13/2011  . Neuropathy 06/13/2011  . Cervical radiculopathy 06/13/2011  . Gout 06/13/2011  . Obesity hypoventilation syndrome ([place]) 06/13/2011  . Cellulitis of leg, right 06/13/2011  . Encounter for well adult exam with abnormal findings 06/04/2011  . OXYGEN-USE OF SUPPLEMENTAL 04/07/2010  . ACUT MI SUBENDOCARDIAL INFARCT SUBSQT EPIS CARE 03/13/2010  . COPD GOLD III 03/10/2010  . Sleep apnea 03/10/2010  . Hypothyroidism, acquired 03/17/2009  . Hyperlipidemia 03/17/2009  . Essential hypertension 03/17/2009  . Coronary atherosclerosis 03/17/2009    Current Outpatient Medications on File Prior to Visit  Medication Sig Dispense Refill  . albuterol (PROAIR HFA) 108 (90 Base) MCG/ACT inhaler Inhale 2 puffs into the lungs See admin instructions. Inhale 2 puffs into the lungs in the morning, 2 puffs at bedtime, and one to two times daily as needed for shortness of breath or wheezing    . allopurinol (ZYLOPRIM) 300 MG tablet TAKE 1 TABLET DAILY (  Patient taking differently: Take 300 mg by mouth daily. ) 90 tablet 1  . amitriptyline (ELAVIL) 100 MG tablet Take 1 tablet (100 mg total) by mouth at bedtime. 90 tablet 1  . apixaban (ELIQUIS) 5 MG TABS tablet Take 1 tablet (5 mg total) by mouth 2 (two) times daily. 180 tablet 1  .  atorvastatin (LIPITOR) 40 MG tablet TAKE 1 TABLET DAILY (Patient taking differently: Take 40 mg by mouth at bedtime. ) 90 tablet 4  . AZO-CRANBERRY PO Take 1 tablet by mouth daily as needed (urinary symptoms).     . B Complex Vitamins (VITAMIN B COMPLEX PO) Take 1 tablet by mouth daily.    . budesonide-formoterol (SYMBICORT) 160-4.5 MCG/ACT inhaler Inhale 2 puffs into the lungs 2 (two) times daily. 30.6 g 2  . cholecalciferol (VITAMIN D3) 25 MCG (1000 UNIT) tablet Take 2,000 Units by mouth daily.    . clonazePAM (KLONOPIN) 0.5 MG tablet Take 1 tablet (0.5 mg total) by mouth 2 (two) times daily as needed for anxiety. 180 tablet 1  . DULoxetine (CYMBALTA) 30 MG capsule TAKE 2 CAPSULES (60 MG TOTAL) BY MOUTH AT BEDTIME. 180 capsule 3  . ferrous sulfate 325 (65 FE) MG tablet Take 325 mg by mouth at bedtime.    . furosemide (LASIX) 40 MG tablet Take 1 tablet (40 mg total) by mouth 2 (two) times daily. 45 tablet 3  . Guaifenesin (MUCINEX MAXIMUM STRENGTH) 1200 MG TB12 Take 1,200 mg by mouth 2 (two) times daily.    [name] levothyroxine (SYNTHROID) 200 MCG tablet Take 1 tablet (200 mcg total) by mouth daily. Annual appt due in Sept must see provider for future refills 90 tablet 1  . metoprolol tartrate (LOPRESSOR) 25 MG tablet Take 0.5 tablets (12.5 mg total) by mouth as needed (for heart rate above 120 at rest). 30 tablet 3  . omeprazole (PRILOSEC) 40 MG capsule Take 1 capsule (40 mg total) by mouth daily. 30 capsule 0  . OXYGEN Inhale 2-3 L/min into the lungs continuous.     . potassium chloride SA (KLOR-CON) 20 MEQ tablet Take 1 tablet (20 mEq total) by mouth 2 (two) times daily. 60 tablet 1  . traMADol (ULTRAM) 50 MG tablet Take 1 tablet (50 mg total) by mouth daily as needed for moderate pain. (Patient taking differently: Take 50 mg by mouth every 6 (six) hours as needed for moderate pain. ) 90 tablet 1  . vitamin C (ASCORBIC ACID) 500 MG tablet Take 500 mg by mouth daily.     [name] VITAMIN E PO Take 1,000  Units by mouth daily.      No current facility-administered medications on file prior to visit.    Past Medical History:  Diagnosis Date  . Anemia   . Asthma 06/13/2011   hx of  . Atrial fibrillation ([place])   . Atrial flutter ([place])   . Breast cancer ([place]) dx'd 2005   No BP check or stick in Left arm  . CAD (coronary artery disease)    a. Nonobstructive CAD 2007 (EF 75%.  RCA  proximal 75% tubular, 25% prox LAD, 25% d1, 50% D2) at [facility]. b. NSTEMI 01/2010 in setting of respiratory failure, medical approach (not a good candidate for noninvasive eval)  . Cellulitis    hx of cellulitis in left leg  . Cervical radiculopathy 06/13/2011  . CHF (congestive heart failure) ([place])   . Complication of anesthesia    per patient "hard to wake up and come out of  the aneshthesia, happened years ago"  . COPD (chronic obstructive pulmonary disease) ([place])    on O2  . Depression 06/13/2011  . Diverticulosis   . Esophageal dysmotility   . Fatty liver   . Gout 06/13/2011  . History of Clostridium difficile early dec 2015   no current diarrhea  . History of home oxygen therapy    2 liters per nasal cannula all the time  . Hx of dysfunctional uterine bleeding    last bleeding jan 2016, worked up for with d and c x 2 no cause found  . Hyperlipidemia   . Hypertension   . Hypothyroidism   . Impaired glucose tolerance 11/01/2012  . Myocardial infarction ([place])   . Neuropathy 06/13/2011  . Neuropathy    per patient in hands and in feet  . Obesity hypoventilation syndrome ([place]) 06/13/2011  . OSA on CPAP   . Personal history of chemotherapy   . Personal history of radiation therapy   . PFO (patent foramen ovale)   . Presence of permanent cardiac pacemaker   . Risk for falls   . Syncope and collapse    Bradycardia with pauses. S/P pacemaker  . Tubular adenoma of colon     Past Surgical History:  Procedure Laterality Date  . APPENDECTOMY    . BREAST BIOPSY    . BREAST LUMPECTOMY Left 05/2004  .  CARDIOVERSION N/A 03/15/2013   Procedure: CARDIOVERSION;  Surgeon: Thayer Headings, MD;  Location: [facility] ENDOSCOPY;  Service: Cardiovascular;  Laterality: N/A;  . CERVICAL BIOPSY  June 2013   at [facility] for Heavy  Bleeding  . [place] SURGERY  2004 or 2005   have cadaver bones,, screws, and plates c 5 to c 6  . COLONOSCOPY    . COLONOSCOPY WITH PROPOFOL N/A 10/22/2014   Procedure: COLONOSCOPY WITH PROPOFOL;  Surgeon: Jerene Bears, MD;  Location: [facility] ENDOSCOPY;  Service: Gastroenterology;  Laterality: N/A;  . DILATION AND CURETTAGE OF UTERUS    . EPIGASTRIC HERNIA REPAIR    . EPIGASTRIC HERNIA REPAIR N/A 10/29/2019   Procedure: EPIGASTRIC HERNIA REPAIR WITH MESH;  Surgeon: Coralie Keens, MD;  Location: [facility];  Service: General;  Laterality: N/A;  LMA ANESTHESIA  . EYE SURGERY    . LAPAROSCOPIC APPENDECTOMY  03/29/2012   Procedure: APPENDECTOMY LAPAROSCOPIC;  Surgeon: Adin Hector, MD;  Location: [facility] ORS;  Service: General;  Laterality: N/A;  . LOOP RECORDER EXPLANT  08/31/2013   Procedure: LOOP RECORDER EXPLANT;  Surgeon: Coralyn Mark, MD;  Location: [facility] CATH LAB;  Service: Cardiovascular;;  . LOOP RECORDER IMPLANT  08-28-2013; 08-31-2013   MDT LinQ implanted by Dr Rayann Heman for syncope; explanted 08-31-2013 after sinus pauses identified  . LOOP RECORDER IMPLANT N/A 08/28/2013   Procedure: LOOP RECORDER IMPLANT;  Surgeon: Coralyn Mark, MD;  Location: [facility] CATH LAB;  Service: Cardiovascular;  Laterality: N/A;  . OVARIAN CYST REMOVAL    . PACEMAKER INSERTION  08-31-2013   Biotronik Evera dual chamber pacemaker placed for neurocardiogenic syncope and sinus pauses by Dr Rayann Heman  . PERMANENT PACEMAKER INSERTION N/A 08/31/2013   Procedure: PERMANENT PACEMAKER INSERTION;  Surgeon: Coralyn Mark, MD;  Location: [facility] CATH LAB;  Service: Cardiovascular;  Laterality: N/A;  . TEE WITHOUT CARDIOVERSION N/A 03/15/2013   Procedure: TRANSESOPHAGEAL ECHOCARDIOGRAM (TEE);  Surgeon: Thayer Headings, MD;  Location: [facility]  ENDOSCOPY;  Service: Cardiovascular;  Laterality: N/A;    Social History   Socioeconomic History  . Marital status: Divorced  Spouse name: Not on file  . [name] of children: 2  . Years of education: Not on file  . Highest education level: Not on file  Occupational History  . Occupation: Retired  Tobacco Use  . Smoking status: Former Smoker    Packs/day: 1.00    Years: 20.00    Pack years: 20.00    Types: Cigarettes    Quit date: 09/20/1998    Years since quitting: 21.2  . Smokeless tobacco: Never Used  . Tobacco comment: 1ppd x 20 years  Substance and Sexual Activity  . Alcohol use: Yes    Comment: rare, on holidays, and on special occasions per patient on 10/25/2019  . Drug use: No  . Sexual activity: Not Currently    Birth control/protection: Post-menopausal  Other Topics Concern  . Not on file  Social History Narrative   Lives alone in an apartment on the first floor.   Has 2 children.  Retired Advertising copywriter.  Education: some college.    Social Determinants of Health   Financial Resource Strain: Low Risk   . Difficulty of Paying Living Expenses: Not very hard  Food Insecurity: No Food Insecurity  . Worried About Charity fundraiser in the Last Year: Never true  . Ran Out of Food in the Last Year: Never true  Transportation Needs: No Transportation Needs  . Lack of Transportation (Medical): No  . Lack of Transportation (Non-Medical): No  Physical Activity: Inactive  . Days of Exercise per Week: 0 days  . Minutes of Exercise per Session: 0 min  Stress: No Stress Concern Present  . Feeling of Stress : Only a little  Social Connections: Slightly Isolated  . Frequency of Communication with Friends and Family: More than three times a week  . Frequency of Social Gatherings with Friends and Family: More than three times a week  . Attends Religious Services: More than 4 times per year  . Active Member of Clubs or Organizations: Yes  . Attends Archivist  Meetings: More than 4 times per year  . Marital Status: Divorced    Family History  Problem Relation Age of Onset  . Uterine cancer Mother   . Heart disease Mother   . Breast cancer Sister 59  . Ovarian cancer Sister   . Breast cancer Cousin 60  . Diabetes Brother   . Breast cancer Maternal Aunt   . Ovarian cancer Sister     Review of Systems     Objective:  There were no vitals filed for this visit. BP Readings from Last 3 Encounters:  11/23/19 140/80  11/14/19 126/72  11/06/19 99/83   Wt Readings from Last 3 Encounters:  11/23/19 277 lb 6.4 oz (125.8 kg)  11/06/19 279 lb 1.6 oz (126.6 kg)  10/29/19 293 lb (132.9 kg)   There is no height or weight on file to calculate BMI.   Physical Exam         Assessment & Plan:    See Problem List for Assessment and Plan of chronic medical problems.    This visit occurred during the SARS-CoV-2 public health emergency.  Safety protocols were in place, including screening questions prior to the visit, additional usage of staff PPE, and extensive cleaning of exam room while observing appropriate contact time as indicated for disinfecting solutions.

## 2019-12-27 ENCOUNTER — Ambulatory Visit: Payer: Medicare HMO | Admitting: Internal Medicine

## 2019-12-27 NOTE — Telephone Encounter (Signed)
Letter sent 12/27/2019

## 2019-12-29 ENCOUNTER — Emergency Department (HOSPITAL_COMMUNITY): Payer: Medicare HMO

## 2019-12-29 ENCOUNTER — Other Ambulatory Visit: Payer: Self-pay

## 2019-12-29 ENCOUNTER — Encounter (HOSPITAL_COMMUNITY): Payer: Self-pay | Admitting: Emergency Medicine

## 2019-12-29 ENCOUNTER — Inpatient Hospital Stay (HOSPITAL_COMMUNITY): Payer: Medicare HMO

## 2019-12-29 ENCOUNTER — Inpatient Hospital Stay (HOSPITAL_COMMUNITY)
Admission: EM | Admit: 2019-12-29 | Discharge: 2020-01-19 | DRG: 682 | Disposition: E | Payer: Medicare HMO | Attending: Internal Medicine | Admitting: Internal Medicine

## 2019-12-29 DIAGNOSIS — R5383 Other fatigue: Secondary | ICD-10-CM | POA: Diagnosis not present

## 2019-12-29 DIAGNOSIS — E662 Morbid (severe) obesity with alveolar hypoventilation: Secondary | ICD-10-CM | POA: Diagnosis present

## 2019-12-29 DIAGNOSIS — L97519 Non-pressure chronic ulcer of other part of right foot with unspecified severity: Secondary | ICD-10-CM | POA: Diagnosis present

## 2019-12-29 DIAGNOSIS — Z8249 Family history of ischemic heart disease and other diseases of the circulatory system: Secondary | ICD-10-CM

## 2019-12-29 DIAGNOSIS — K7200 Acute and subacute hepatic failure without coma: Secondary | ICD-10-CM | POA: Diagnosis not present

## 2019-12-29 DIAGNOSIS — E878 Other disorders of electrolyte and fluid balance, not elsewhere classified: Secondary | ICD-10-CM | POA: Diagnosis present

## 2019-12-29 DIAGNOSIS — Z789 Other specified health status: Secondary | ICD-10-CM

## 2019-12-29 DIAGNOSIS — Z452 Encounter for adjustment and management of vascular access device: Secondary | ICD-10-CM | POA: Diagnosis not present

## 2019-12-29 DIAGNOSIS — S3993XA Unspecified injury of pelvis, initial encounter: Secondary | ICD-10-CM | POA: Diagnosis not present

## 2019-12-29 DIAGNOSIS — M6282 Rhabdomyolysis: Secondary | ICD-10-CM

## 2019-12-29 DIAGNOSIS — K766 Portal hypertension: Secondary | ICD-10-CM | POA: Diagnosis present

## 2019-12-29 DIAGNOSIS — E162 Hypoglycemia, unspecified: Secondary | ICD-10-CM | POA: Diagnosis not present

## 2019-12-29 DIAGNOSIS — I252 Old myocardial infarction: Secondary | ICD-10-CM

## 2019-12-29 DIAGNOSIS — R4182 Altered mental status, unspecified: Secondary | ICD-10-CM | POA: Diagnosis present

## 2019-12-29 DIAGNOSIS — I5033 Acute on chronic diastolic (congestive) heart failure: Secondary | ICD-10-CM | POA: Diagnosis not present

## 2019-12-29 DIAGNOSIS — K7460 Unspecified cirrhosis of liver: Secondary | ICD-10-CM | POA: Diagnosis not present

## 2019-12-29 DIAGNOSIS — N179 Acute kidney failure, unspecified: Secondary | ICD-10-CM | POA: Diagnosis not present

## 2019-12-29 DIAGNOSIS — E785 Hyperlipidemia, unspecified: Secondary | ICD-10-CM | POA: Diagnosis present

## 2019-12-29 DIAGNOSIS — S0990XA Unspecified injury of head, initial encounter: Secondary | ICD-10-CM | POA: Diagnosis not present

## 2019-12-29 DIAGNOSIS — R5381 Other malaise: Secondary | ICD-10-CM | POA: Diagnosis present

## 2019-12-29 DIAGNOSIS — Z881 Allergy status to other antibiotic agents status: Secondary | ICD-10-CM

## 2019-12-29 DIAGNOSIS — N183 Chronic kidney disease, stage 3 unspecified: Secondary | ICD-10-CM | POA: Diagnosis not present

## 2019-12-29 DIAGNOSIS — S0003XA Contusion of scalp, initial encounter: Secondary | ICD-10-CM | POA: Diagnosis not present

## 2019-12-29 DIAGNOSIS — I480 Paroxysmal atrial fibrillation: Secondary | ICD-10-CM | POA: Diagnosis present

## 2019-12-29 DIAGNOSIS — R0603 Acute respiratory distress: Secondary | ICD-10-CM

## 2019-12-29 DIAGNOSIS — E440 Moderate protein-calorie malnutrition: Secondary | ICD-10-CM | POA: Diagnosis not present

## 2019-12-29 DIAGNOSIS — F329 Major depressive disorder, single episode, unspecified: Secondary | ICD-10-CM | POA: Diagnosis present

## 2019-12-29 DIAGNOSIS — Z9049 Acquired absence of other specified parts of digestive tract: Secondary | ICD-10-CM

## 2019-12-29 DIAGNOSIS — R0602 Shortness of breath: Secondary | ICD-10-CM

## 2019-12-29 DIAGNOSIS — Z923 Personal history of irradiation: Secondary | ICD-10-CM

## 2019-12-29 DIAGNOSIS — Z79899 Other long term (current) drug therapy: Secondary | ICD-10-CM

## 2019-12-29 DIAGNOSIS — R531 Weakness: Secondary | ICD-10-CM | POA: Diagnosis not present

## 2019-12-29 DIAGNOSIS — Z515 Encounter for palliative care: Secondary | ICD-10-CM

## 2019-12-29 DIAGNOSIS — R609 Edema, unspecified: Secondary | ICD-10-CM | POA: Diagnosis not present

## 2019-12-29 DIAGNOSIS — F0390 Unspecified dementia without behavioral disturbance: Secondary | ICD-10-CM | POA: Diagnosis present

## 2019-12-29 DIAGNOSIS — T8089XA Other complications following infusion, transfusion and therapeutic injection, initial encounter: Secondary | ICD-10-CM | POA: Diagnosis not present

## 2019-12-29 DIAGNOSIS — D631 Anemia in chronic kidney disease: Secondary | ICD-10-CM | POA: Diagnosis present

## 2019-12-29 DIAGNOSIS — Z853 Personal history of malignant neoplasm of breast: Secondary | ICD-10-CM

## 2019-12-29 DIAGNOSIS — R400 Somnolence: Secondary | ICD-10-CM | POA: Diagnosis not present

## 2019-12-29 DIAGNOSIS — E860 Dehydration: Secondary | ICD-10-CM | POA: Diagnosis present

## 2019-12-29 DIAGNOSIS — J9621 Acute and chronic respiratory failure with hypoxia: Secondary | ICD-10-CM | POA: Diagnosis not present

## 2019-12-29 DIAGNOSIS — Z885 Allergy status to narcotic agent status: Secondary | ICD-10-CM

## 2019-12-29 DIAGNOSIS — W19XXXA Unspecified fall, initial encounter: Secondary | ICD-10-CM

## 2019-12-29 DIAGNOSIS — I350 Nonrheumatic aortic (valve) stenosis: Secondary | ICD-10-CM | POA: Diagnosis not present

## 2019-12-29 DIAGNOSIS — R188 Other ascites: Secondary | ICD-10-CM | POA: Diagnosis not present

## 2019-12-29 DIAGNOSIS — S50811A Abrasion of right forearm, initial encounter: Secondary | ICD-10-CM | POA: Diagnosis present

## 2019-12-29 DIAGNOSIS — Z950 Presence of cardiac pacemaker: Secondary | ICD-10-CM

## 2019-12-29 DIAGNOSIS — D539 Nutritional anemia, unspecified: Secondary | ICD-10-CM | POA: Diagnosis present

## 2019-12-29 DIAGNOSIS — Z20822 Contact with and (suspected) exposure to covid-19: Secondary | ICD-10-CM | POA: Diagnosis present

## 2019-12-29 DIAGNOSIS — G9341 Metabolic encephalopathy: Secondary | ICD-10-CM | POA: Diagnosis not present

## 2019-12-29 DIAGNOSIS — E039 Hypothyroidism, unspecified: Secondary | ICD-10-CM | POA: Diagnosis present

## 2019-12-29 DIAGNOSIS — R600 Localized edema: Secondary | ICD-10-CM | POA: Diagnosis not present

## 2019-12-29 DIAGNOSIS — L89316 Pressure-induced deep tissue damage of right buttock: Secondary | ICD-10-CM | POA: Diagnosis present

## 2019-12-29 DIAGNOSIS — J9622 Acute and chronic respiratory failure with hypercapnia: Secondary | ICD-10-CM | POA: Diagnosis present

## 2019-12-29 DIAGNOSIS — R238 Other skin changes: Secondary | ICD-10-CM | POA: Diagnosis present

## 2019-12-29 DIAGNOSIS — L89619 Pressure ulcer of right heel, unspecified stage: Secondary | ICD-10-CM | POA: Diagnosis present

## 2019-12-29 DIAGNOSIS — G92 Toxic encephalopathy: Secondary | ICD-10-CM | POA: Diagnosis present

## 2019-12-29 DIAGNOSIS — S199XXA Unspecified injury of neck, initial encounter: Secondary | ICD-10-CM | POA: Diagnosis not present

## 2019-12-29 DIAGNOSIS — M1A00X0 Idiopathic chronic gout, unspecified site, without tophus (tophi): Secondary | ICD-10-CM | POA: Diagnosis not present

## 2019-12-29 DIAGNOSIS — Z6841 Body Mass Index (BMI) 40.0 and over, adult: Secondary | ICD-10-CM | POA: Diagnosis not present

## 2019-12-29 DIAGNOSIS — S80822A Blister (nonthermal), left lower leg, initial encounter: Secondary | ICD-10-CM | POA: Diagnosis present

## 2019-12-29 DIAGNOSIS — I878 Other specified disorders of veins: Secondary | ICD-10-CM | POA: Diagnosis present

## 2019-12-29 DIAGNOSIS — J449 Chronic obstructive pulmonary disease, unspecified: Secondary | ICD-10-CM | POA: Diagnosis present

## 2019-12-29 DIAGNOSIS — E876 Hypokalemia: Secondary | ICD-10-CM | POA: Diagnosis not present

## 2019-12-29 DIAGNOSIS — R946 Abnormal results of thyroid function studies: Secondary | ICD-10-CM | POA: Diagnosis present

## 2019-12-29 DIAGNOSIS — Q211 Atrial septal defect: Secondary | ICD-10-CM

## 2019-12-29 DIAGNOSIS — E870 Hyperosmolality and hypernatremia: Secondary | ICD-10-CM | POA: Diagnosis not present

## 2019-12-29 DIAGNOSIS — Z880 Allergy status to penicillin: Secondary | ICD-10-CM

## 2019-12-29 DIAGNOSIS — R0902 Hypoxemia: Secondary | ICD-10-CM | POA: Diagnosis not present

## 2019-12-29 DIAGNOSIS — S51011A Laceration without foreign body of right elbow, initial encounter: Secondary | ICD-10-CM | POA: Diagnosis present

## 2019-12-29 DIAGNOSIS — K8020 Calculus of gallbladder without cholecystitis without obstruction: Secondary | ICD-10-CM | POA: Diagnosis not present

## 2019-12-29 DIAGNOSIS — J9601 Acute respiratory failure with hypoxia: Secondary | ICD-10-CM

## 2019-12-29 DIAGNOSIS — Z833 Family history of diabetes mellitus: Secondary | ICD-10-CM

## 2019-12-29 DIAGNOSIS — E871 Hypo-osmolality and hyponatremia: Secondary | ICD-10-CM | POA: Diagnosis present

## 2019-12-29 DIAGNOSIS — Z66 Do not resuscitate: Secondary | ICD-10-CM

## 2019-12-29 DIAGNOSIS — R1310 Dysphagia, unspecified: Secondary | ICD-10-CM | POA: Diagnosis present

## 2019-12-29 DIAGNOSIS — Y92009 Unspecified place in unspecified non-institutional (private) residence as the place of occurrence of the external cause: Secondary | ICD-10-CM

## 2019-12-29 DIAGNOSIS — Z91040 Latex allergy status: Secondary | ICD-10-CM

## 2019-12-29 DIAGNOSIS — Z7901 Long term (current) use of anticoagulants: Secondary | ICD-10-CM

## 2019-12-29 DIAGNOSIS — J9811 Atelectasis: Secondary | ICD-10-CM | POA: Diagnosis not present

## 2019-12-29 DIAGNOSIS — S80821A Blister (nonthermal), right lower leg, initial encounter: Secondary | ICD-10-CM | POA: Diagnosis present

## 2019-12-29 DIAGNOSIS — E875 Hyperkalemia: Secondary | ICD-10-CM | POA: Diagnosis present

## 2019-12-29 DIAGNOSIS — I2721 Secondary pulmonary arterial hypertension: Secondary | ICD-10-CM | POA: Diagnosis present

## 2019-12-29 DIAGNOSIS — Z87891 Personal history of nicotine dependence: Secondary | ICD-10-CM

## 2019-12-29 DIAGNOSIS — K7469 Other cirrhosis of liver: Secondary | ICD-10-CM

## 2019-12-29 DIAGNOSIS — D696 Thrombocytopenia, unspecified: Secondary | ICD-10-CM | POA: Diagnosis not present

## 2019-12-29 DIAGNOSIS — K7689 Other specified diseases of liver: Secondary | ICD-10-CM | POA: Diagnosis not present

## 2019-12-29 DIAGNOSIS — Z888 Allergy status to other drugs, medicaments and biological substances status: Secondary | ICD-10-CM

## 2019-12-29 DIAGNOSIS — K7290 Hepatic failure, unspecified without coma: Secondary | ICD-10-CM | POA: Diagnosis not present

## 2019-12-29 DIAGNOSIS — I495 Sick sinus syndrome: Secondary | ICD-10-CM | POA: Diagnosis not present

## 2019-12-29 DIAGNOSIS — S299XXA Unspecified injury of thorax, initial encounter: Secondary | ICD-10-CM | POA: Diagnosis not present

## 2019-12-29 DIAGNOSIS — E872 Acidosis: Secondary | ICD-10-CM | POA: Diagnosis present

## 2019-12-29 DIAGNOSIS — Z79890 Hormone replacement therapy (postmenopausal): Secondary | ICD-10-CM

## 2019-12-29 DIAGNOSIS — T796XXA Traumatic ischemia of muscle, initial encounter: Secondary | ICD-10-CM | POA: Diagnosis not present

## 2019-12-29 DIAGNOSIS — I5030 Unspecified diastolic (congestive) heart failure: Secondary | ICD-10-CM | POA: Diagnosis not present

## 2019-12-29 DIAGNOSIS — S0081XA Abrasion of other part of head, initial encounter: Secondary | ICD-10-CM | POA: Diagnosis present

## 2019-12-29 DIAGNOSIS — R404 Transient alteration of awareness: Secondary | ICD-10-CM | POA: Diagnosis not present

## 2019-12-29 DIAGNOSIS — Z803 Family history of malignant neoplasm of breast: Secondary | ICD-10-CM

## 2019-12-29 DIAGNOSIS — R7989 Other specified abnormal findings of blood chemistry: Secondary | ICD-10-CM

## 2019-12-29 DIAGNOSIS — N1832 Chronic kidney disease, stage 3b: Secondary | ICD-10-CM | POA: Diagnosis present

## 2019-12-29 DIAGNOSIS — Z9221 Personal history of antineoplastic chemotherapy: Secondary | ICD-10-CM

## 2019-12-29 DIAGNOSIS — J9610 Chronic respiratory failure, unspecified whether with hypoxia or hypercapnia: Secondary | ICD-10-CM | POA: Diagnosis not present

## 2019-12-29 DIAGNOSIS — R0609 Other forms of dyspnea: Secondary | ICD-10-CM | POA: Diagnosis not present

## 2019-12-29 DIAGNOSIS — L97511 Non-pressure chronic ulcer of other part of right foot limited to breakdown of skin: Secondary | ICD-10-CM | POA: Diagnosis not present

## 2019-12-29 DIAGNOSIS — K828 Other specified diseases of gallbladder: Secondary | ICD-10-CM | POA: Diagnosis present

## 2019-12-29 DIAGNOSIS — Z9181 History of falling: Secondary | ICD-10-CM

## 2019-12-29 DIAGNOSIS — I2510 Atherosclerotic heart disease of native coronary artery without angina pectoris: Secondary | ICD-10-CM | POA: Diagnosis present

## 2019-12-29 DIAGNOSIS — G629 Polyneuropathy, unspecified: Secondary | ICD-10-CM | POA: Diagnosis present

## 2019-12-29 DIAGNOSIS — K7581 Nonalcoholic steatohepatitis (NASH): Secondary | ICD-10-CM | POA: Diagnosis not present

## 2019-12-29 DIAGNOSIS — Z8041 Family history of malignant neoplasm of ovary: Secondary | ICD-10-CM

## 2019-12-29 DIAGNOSIS — Z8049 Family history of malignant neoplasm of other genital organs: Secondary | ICD-10-CM

## 2019-12-29 DIAGNOSIS — I130 Hypertensive heart and chronic kidney disease with heart failure and stage 1 through stage 4 chronic kidney disease, or unspecified chronic kidney disease: Secondary | ICD-10-CM | POA: Diagnosis present

## 2019-12-29 DIAGNOSIS — I083 Combined rheumatic disorders of mitral, aortic and tricuspid valves: Secondary | ICD-10-CM | POA: Diagnosis present

## 2019-12-29 DIAGNOSIS — Z9981 Dependence on supplemental oxygen: Secondary | ICD-10-CM

## 2019-12-29 DIAGNOSIS — Y828 Other medical devices associated with adverse incidents: Secondary | ICD-10-CM | POA: Diagnosis not present

## 2019-12-29 DIAGNOSIS — R0600 Dyspnea, unspecified: Secondary | ICD-10-CM

## 2019-12-29 DIAGNOSIS — L89026 Pressure-induced deep tissue damage of left elbow: Secondary | ICD-10-CM | POA: Diagnosis present

## 2019-12-29 DIAGNOSIS — K7682 Hepatic encephalopathy: Secondary | ICD-10-CM

## 2019-12-29 DIAGNOSIS — S81831A Puncture wound without foreign body, right lower leg, initial encounter: Secondary | ICD-10-CM | POA: Diagnosis present

## 2019-12-29 DIAGNOSIS — I517 Cardiomegaly: Secondary | ICD-10-CM | POA: Diagnosis not present

## 2019-12-29 DIAGNOSIS — Z79891 Long term (current) use of opiate analgesic: Secondary | ICD-10-CM

## 2019-12-29 DIAGNOSIS — I351 Nonrheumatic aortic (valve) insufficiency: Secondary | ICD-10-CM | POA: Diagnosis not present

## 2019-12-29 DIAGNOSIS — I4891 Unspecified atrial fibrillation: Secondary | ICD-10-CM | POA: Diagnosis not present

## 2019-12-29 LAB — POCT I-STAT EG7
Acid-base deficit: 3 mmol/L — ABNORMAL HIGH (ref 0.0–2.0)
Bicarbonate: 22.9 mmol/L (ref 20.0–28.0)
Calcium, Ion: 1.01 mmol/L — ABNORMAL LOW (ref 1.15–1.40)
HCT: 43 % (ref 36.0–46.0)
Hemoglobin: 14.6 g/dL (ref 12.0–15.0)
O2 Saturation: 92 %
Potassium: 5.6 mmol/L — ABNORMAL HIGH (ref 3.5–5.1)
Sodium: 147 mmol/L — ABNORMAL HIGH (ref 135–145)
TCO2: 24 mmol/L (ref 22–32)
pCO2, Ven: 42.3 mmHg — ABNORMAL LOW (ref 44.0–60.0)
pH, Ven: 7.342 (ref 7.250–7.430)
pO2, Ven: 66 mmHg — ABNORMAL HIGH (ref 32.0–45.0)

## 2019-12-29 LAB — CBC WITH DIFFERENTIAL/PLATELET
Abs Immature Granulocytes: 0.03 10*3/uL (ref 0.00–0.07)
Basophils Absolute: 0 10*3/uL (ref 0.0–0.1)
Basophils Relative: 0 %
Eosinophils Absolute: 0 10*3/uL (ref 0.0–0.5)
Eosinophils Relative: 0 %
HCT: 44.9 % (ref 36.0–46.0)
Hemoglobin: 12.9 g/dL (ref 12.0–15.0)
Immature Granulocytes: 0 %
Lymphocytes Relative: 7 %
Lymphs Abs: 0.5 10*3/uL — ABNORMAL LOW (ref 0.7–4.0)
MCH: 30.6 pg (ref 26.0–34.0)
MCHC: 28.7 g/dL — ABNORMAL LOW (ref 30.0–36.0)
MCV: 106.4 fL — ABNORMAL HIGH (ref 80.0–100.0)
Monocytes Absolute: 0.8 10*3/uL (ref 0.1–1.0)
Monocytes Relative: 12 %
Neutro Abs: 5.5 10*3/uL (ref 1.7–7.7)
Neutrophils Relative %: 81 %
Platelets: 171 10*3/uL (ref 150–400)
RBC: 4.22 MIL/uL (ref 3.87–5.11)
RDW: 19.6 % — ABNORMAL HIGH (ref 11.5–15.5)
WBC: 6.9 10*3/uL (ref 4.0–10.5)
nRBC: 1.7 % — ABNORMAL HIGH (ref 0.0–0.2)

## 2019-12-29 LAB — TSH: TSH: 6.633 u[IU]/mL — ABNORMAL HIGH (ref 0.350–4.500)

## 2019-12-29 LAB — HEPATIC FUNCTION PANEL
ALT: 55 U/L — ABNORMAL HIGH (ref 0–44)
AST: 115 U/L — ABNORMAL HIGH (ref 15–41)
Albumin: 3.3 g/dL — ABNORMAL LOW (ref 3.5–5.0)
Alkaline Phosphatase: 155 U/L — ABNORMAL HIGH (ref 38–126)
Bilirubin, Direct: 1.4 mg/dL — ABNORMAL HIGH (ref 0.0–0.2)
Indirect Bilirubin: 1.5 mg/dL — ABNORMAL HIGH (ref 0.3–0.9)
Total Bilirubin: 2.9 mg/dL — ABNORMAL HIGH (ref 0.3–1.2)
Total Protein: 6.3 g/dL — ABNORMAL LOW (ref 6.5–8.1)

## 2019-12-29 LAB — APTT: aPTT: 35 seconds (ref 24–36)

## 2019-12-29 LAB — BASIC METABOLIC PANEL
Anion gap: 13 (ref 5–15)
BUN: 67 mg/dL — ABNORMAL HIGH (ref 8–23)
CO2: 21 mmol/L — ABNORMAL LOW (ref 22–32)
Calcium: 8.9 mg/dL (ref 8.9–10.3)
Chloride: 113 mmol/L — ABNORMAL HIGH (ref 98–111)
Creatinine, Ser: 2.48 mg/dL — ABNORMAL HIGH (ref 0.44–1.00)
GFR calc Af Amer: 22 mL/min — ABNORMAL LOW (ref >60)
GFR calc non Af Amer: 19 mL/min — ABNORMAL LOW (ref >60)
Glucose, Bld: 99 mg/dL (ref 70–99)
Potassium: 5.7 mmol/L — ABNORMAL HIGH (ref 3.5–5.1)
Sodium: 147 mmol/L — ABNORMAL HIGH (ref 135–145)

## 2019-12-29 LAB — HEPARIN LEVEL (UNFRACTIONATED): Heparin Unfractionated: >2.2 IU/mL — ABNORMAL HIGH (ref 0.30–0.70)

## 2019-12-29 LAB — URINALYSIS, ROUTINE W REFLEX MICROSCOPIC
Bacteria, UA: NONE SEEN
Bilirubin Urine: NEGATIVE
Glucose, UA: NEGATIVE mg/dL
Ketones, ur: NEGATIVE mg/dL
Leukocytes,Ua: NEGATIVE
Nitrite: NEGATIVE
Protein, ur: 100 mg/dL — AB
Specific Gravity, Urine: 1.012 (ref 1.005–1.030)
pH: 5 (ref 5.0–8.0)

## 2019-12-29 LAB — URIC ACID: Uric Acid, Serum: 15.3 mg/dL — ABNORMAL HIGH (ref 2.5–7.1)

## 2019-12-29 LAB — SARS CORONAVIRUS 2 (TAT 6-24 HRS): SARS Coronavirus 2: NEGATIVE

## 2019-12-29 LAB — CK: Total CK: 2047 U/L — ABNORMAL HIGH (ref 38–234)

## 2019-12-29 LAB — LIPASE, BLOOD: Lipase: 19 U/L (ref 11–51)

## 2019-12-29 LAB — TROPONIN I (HIGH SENSITIVITY): Troponin I (High Sensitivity): 65 ng/L — ABNORMAL HIGH (ref <18)

## 2019-12-29 MED ORDER — HEPARIN (PORCINE) 25000 UT/250ML-% IV SOLN
1500.0000 [IU]/h | INTRAVENOUS | Status: DC
  Administered 2019-12-29: 18:00:00 1350 [IU]/h via INTRAVENOUS
  Administered 2019-12-30: 14:00:00 1500 [IU]/h via INTRAVENOUS
  Filled 2019-12-29 (×3): qty 250

## 2019-12-29 MED ORDER — GERHARDT'S BUTT CREAM
TOPICAL_CREAM | Freq: Two times a day (BID) | CUTANEOUS | Status: DC | PRN
  Filled 2019-12-29: qty 1

## 2019-12-29 MED ORDER — APIXABAN 5 MG PO TABS: 5.0000 mg | ORAL_TABLET | Freq: Two times a day (BID) | ORAL | Status: DC

## 2019-12-29 MED ORDER — B COMPLEX-C PO TABS: 1.0000 | ORAL_TABLET | Freq: Every day | ORAL | Status: DC

## 2019-12-29 MED ORDER — ACETAMINOPHEN 325 MG PO TABS
650.0000 mg | ORAL_TABLET | Freq: Four times a day (QID) | ORAL | Status: DC | PRN
  Administered 2019-12-30 – 2020-01-16 (×5): 650 mg via ORAL
  Filled 2019-12-29 (×6): qty 2

## 2019-12-29 MED ORDER — ONDANSETRON HCL 4 MG/2ML IJ SOLN: 4.0000 mg | Freq: Four times a day (QID) | INTRAMUSCULAR | Status: DC | PRN

## 2019-12-29 MED ORDER — SODIUM ZIRCONIUM CYCLOSILICATE 10 G PO PACK: 10.0000 g | PACK | Freq: Once | ORAL | Status: DC

## 2019-12-29 MED ORDER — ASCORBIC ACID 500 MG PO TABS: 500.0000 mg | ORAL_TABLET | Freq: Every day | ORAL | Status: DC

## 2019-12-29 MED ORDER — PANTOPRAZOLE SODIUM 40 MG PO TBEC: 40.0000 mg | DELAYED_RELEASE_TABLET | Freq: Every day | ORAL | Status: DC

## 2019-12-29 MED ORDER — ACETAMINOPHEN 650 MG RE SUPP: 650.0000 mg | Freq: Four times a day (QID) | RECTAL | Status: DC | PRN

## 2019-12-29 MED ORDER — ALLOPURINOL 300 MG PO TABS: 300.0000 mg | ORAL_TABLET | Freq: Every day | ORAL | Status: DC

## 2019-12-29 MED ORDER — FLUTICASONE FUROATE-VILANTEROL 200-25 MCG/INH IN AEPB
1.0000 | INHALATION_SPRAY | Freq: Every day | RESPIRATORY_TRACT | Status: DC
  Administered 2019-12-30: 1 via RESPIRATORY_TRACT
  Filled 2019-12-29: qty 28

## 2019-12-29 MED ORDER — DULOXETINE HCL 60 MG PO CPEP: 60.0000 mg | ORAL_CAPSULE | Freq: Every day | ORAL | Status: DC

## 2019-12-29 MED ORDER — ALBUTEROL SULFATE (2.5 MG/3ML) 0.083% IN NEBU: 2.5000 mg | INHALATION_SOLUTION | Freq: Four times a day (QID) | RESPIRATORY_TRACT | Status: DC | PRN

## 2019-12-29 MED ORDER — AMITRIPTYLINE HCL 25 MG PO TABS: 100.0000 mg | ORAL_TABLET | Freq: Every day | ORAL | Status: DC

## 2019-12-29 MED ORDER — HEPARIN (PORCINE) 25000 UT/250ML-% IV SOLN: 12.0000 [IU]/kg/h | INTRAVENOUS | Status: DC

## 2019-12-29 MED ORDER — SODIUM CHLORIDE 0.9 % IV SOLN: INTRAVENOUS | Status: DC

## 2019-12-29 MED ORDER — SODIUM CHLORIDE 0.9 % IV BOLUS
1000.0000 mL | Freq: Once | INTRAVENOUS | Status: AC
  Administered 2019-12-29: 11:00:00 1000 mL via INTRAVENOUS

## 2019-12-29 MED ORDER — LEVOTHYROXINE SODIUM 100 MCG PO TABS: 200.0000 ug | ORAL_TABLET | Freq: Every day | ORAL | Status: DC

## 2019-12-29 MED ORDER — FERROUS SULFATE 325 (65 FE) MG PO TABS: 325.0000 mg | ORAL_TABLET | Freq: Every day | ORAL | Status: DC

## 2019-12-29 MED ORDER — LEVOTHYROXINE SODIUM 100 MCG/5ML IV SOLN
100.0000 ug | Freq: Every day | INTRAVENOUS | Status: DC
  Administered 2019-12-31 – 2020-01-03 (×4): 100 ug via INTRAVENOUS
  Filled 2019-12-29 (×6): qty 5

## 2019-12-29 MED ORDER — VITAMIN D 25 MCG (1000 UNIT) PO TABS: 2000.0000 [IU] | ORAL_TABLET | Freq: Every day | ORAL | Status: DC

## 2019-12-29 MED ORDER — GUAIFENESIN ER 600 MG PO TB12: 1200.0000 mg | ORAL_TABLET | Freq: Two times a day (BID) | ORAL | Status: DC

## 2019-12-29 NOTE — ED Provider Notes (Signed)
[facility] EMERGENCY DEPARTMENT Provider Note   CSN: [id number] Arrival date & time: 12/21/2019  1023     History Chief Complaint  Patient presents with   [name] [name] is a 74 y.o. female.  Patient states that she fell on Easter which was about 6 days ago.  Patient was unable to get up off the floor and spent the week on the floor.  Lives by herself.  Wellness check was done today after neighbors found that patient was not picking up her mail from the mailbox and bringing in packages that were left on her doorstep.  EMS found patient on the floor with pressure ulcers on the right side of her body.  Patient normally wears 2 L of oxygen.  EMS found her 80% on room air.  Patient states pain all over.  Denies any chest pain, shortness of breath.  The history is provided by the patient.  Fall This is a new problem. The current episode started more than 2 days ago. The problem occurs daily. The problem has not changed since onset.Pertinent negatives include no chest pain, no abdominal pain, no headaches and no shortness of breath. Nothing aggravates the symptoms. Nothing relieves the symptoms. She has tried nothing for the symptoms. The treatment provided no relief.       Past Medical History:  Diagnosis Date   Anemia    Asthma 06/13/2011   hx of   Atrial fibrillation (HCC)    Atrial flutter ([place])    Breast cancer ([place]) dx'd 2005   No BP check or stick in Left arm   CAD (coronary artery disease)    a. Nonobstructive CAD 2007 (EF 75%.  RCA  proximal 75% tubular, 25% prox LAD, 25% d1, 50% D2) at [facility]. b. NSTEMI 01/2010 in setting of respiratory failure, medical approach (not a good candidate for noninvasive eval)   Cellulitis    hx of cellulitis in left leg   Cervical radiculopathy 06/13/2011   CHF (congestive heart failure) ([place])    Complication of anesthesia    per patient "hard to wake up and come out of the aneshthesia, happened years ago"    COPD (chronic obstructive pulmonary disease) ([place])    on O2   Depression 06/13/2011   Diverticulosis    Esophageal dysmotility    Fatty liver    Gout 06/13/2011   History of Clostridium difficile early dec 2015   no current diarrhea   History of home oxygen therapy    2 liters per nasal cannula all the time   Hx of dysfunctional uterine bleeding    last bleeding jan 2016, worked up for with d and c x 2 no cause found   Hyperlipidemia    Hypertension    Hypothyroidism    Impaired glucose tolerance 11/01/2012   Myocardial infarction [facility])    Neuropathy 06/13/2011   Neuropathy    per patient in hands and in feet   Obesity hypoventilation syndrome ([place]) 06/13/2011   OSA on CPAP    Personal history of chemotherapy    Personal history of radiation therapy    PFO (patent foramen ovale)    Presence of permanent cardiac pacemaker    Risk for falls    Syncope and collapse    Bradycardia with pauses. S/P pacemaker   Tubular adenoma of colon     Patient Active Problem List   Diagnosis Date Noted   Low grade fever 11/24/2019   Acute encephalopathy  11/02/2019   Ileus ([place]) 11/01/2019   Epigastric hernia 10/29/2019   AKI (acute kidney injury) ([place]) 05/04/2019   Hypotension 05/03/2019   Anxiety 03/22/2018   COPD with acute exacerbation ([place]) 12/28/2017   Chronic respiratory failure with hypoxia and hypercapnia ([place]) 11/04/2016   Tinea 09/26/2016   Dyspnea on exertion 09/21/2016   Morbid (severe) obesity due to excess calories ([place]) 09/21/2016   History of left breast cancer 06/08/2016   Panic attacks 04/27/2016   Chest pain 04/11/2016   OSA (obstructive sleep apnea) 02/12/2016   Adhesive capsulitis of right shoulder 08/21/2015   Right shoulder pain 07/16/2015   Osteoarthritis, hand 07/16/2015   Hair loss 07/16/2015   Blood in mouth of unknown source 05/06/2015   Dysphagia, pharyngoesophageal phase 05/06/2015   Chronic respiratory  failure ([place]) 10/28/2014   Cellulitis of female breast 10/25/2014   Rash and nonspecific skin eruption 10/25/2014   Hx of colonic polyps    Benign neoplasm of cecum    Benign neoplasm of transverse colon    CHF (congestive heart failure) ([place])    History of Clostridium difficile 06/27/2014   RUQ pain 05/31/2014   Lump in the abdomen 05/31/2014   Diarrhea 05/14/2014   Abdominal pain, other specified site 05/14/2014   Pacemaker 12/03/2013   Exfoliative dermatitis 11/27/2013   Urinary urgency 11/13/2013   Post-menopausal bleeding 11/13/2013   Syncope and collapse 08/30/2013   Atrial fibrillation ([place])    Syncope 08/26/2013   Postmenopausal vaginal bleeding 05/02/2013   Tachy-brady syndrome ([place]) 03/15/2013   Acute on chronic diastolic CHF (congestive heart failure) ([place]) 03/15/2013   PFO (patent foramen ovale) 03/15/2013   Hypokalemia 03/15/2013   Atrial flutter ([place]) 03/01/2013   Tachycardia 03/01/2013   Anticoagulant long-term use 03/01/2013   Impaired glucose tolerance 11/01/2012   Abnormal liver function tests 11/01/2012   Right lumbar radiculopathy 04/28/2012   Cough 04/15/2012   Abnormal TSH 04/15/2012   Acute appendicitis 03/29/2012   Sick sinus syndrome ([name]) 03/08/2012   Morbid obesity with BMI of 50.0-59.9, adult ([place]) 03/08/2012   Breast cancer ([place]) 06/13/2011   Asthma 06/13/2011   Depression 06/13/2011   Colon polyps 06/13/2011   Neuropathy 06/13/2011   Cervical radiculopathy 06/13/2011   Gout 06/13/2011   Obesity hypoventilation syndrome ([place]) 06/13/2011   Cellulitis of leg, right 06/13/2011   Encounter for well adult exam with abnormal findings 06/04/2011   OXYGEN-USE OF SUPPLEMENTAL 04/07/2010   ACUT MI SUBENDOCARDIAL INFARCT SUBSQT EPIS CARE 03/13/2010   COPD GOLD III 03/10/2010   Sleep apnea 03/10/2010   Hypothyroidism, acquired 03/17/2009   Hyperlipidemia 03/17/2009   Essential hypertension  03/17/2009   Coronary atherosclerosis 03/17/2009    Past Surgical History:  Procedure Laterality Date   APPENDECTOMY     BREAST BIOPSY     BREAST LUMPECTOMY Left 05/2004   CARDIOVERSION N/A 03/15/2013   Procedure: CARDIOVERSION;  Surgeon: Thayer Headings, MD;  Location: [facility] ENDOSCOPY;  Service: Cardiovascular;  Laterality: N/A;   CERVICAL BIOPSY  June 2013   at [facility] for Heavy  Bleeding   CERVICAL [place] SURGERY  2004 or 2005   have cadaver bones,, screws, and plates c 5 to c 6   COLONOSCOPY     COLONOSCOPY WITH PROPOFOL N/A 10/22/2014   Procedure: COLONOSCOPY WITH PROPOFOL;  Surgeon: Jerene Bears, MD;  Location: [facility] ENDOSCOPY;  Service: Gastroenterology;  Laterality: N/A;   DILATION AND CURETTAGE OF UTERUS     EPIGASTRIC HERNIA REPAIR     EPIGASTRIC HERNIA REPAIR  N/A 10/29/2019   Procedure: EPIGASTRIC HERNIA REPAIR WITH MESH;  Surgeon: Coralie Keens, MD;  Location: [facility];  Service: General;  Laterality: N/A;  LMA ANESTHESIA   EYE SURGERY     LAPAROSCOPIC APPENDECTOMY  03/29/2012   Procedure: APPENDECTOMY LAPAROSCOPIC;  Surgeon: Adin Hector, MD;  Location: [facility] ORS;  Service: General;  Laterality: N/A;   LOOP RECORDER EXPLANT  08/31/2013   Procedure: LOOP RECORDER EXPLANT;  Surgeon: Coralyn Mark, MD;  Location: [facility] CATH LAB;  Service: Cardiovascular;;   LOOP RECORDER IMPLANT  08-28-2013; 08-31-2013   MDT LinQ implanted by Dr Rayann Heman for syncope; explanted 08-31-2013 after sinus pauses identified   LOOP RECORDER IMPLANT N/A 08/28/2013   Procedure: LOOP RECORDER IMPLANT;  Surgeon: Coralyn Mark, MD;  Location: [facility] CATH LAB;  Service: Cardiovascular;  Laterality: N/A;   OVARIAN CYST REMOVAL     PACEMAKER INSERTION  08-31-2013   Biotronik Evera dual chamber pacemaker placed for neurocardiogenic syncope and sinus pauses by Dr Rayann Heman   PERMANENT PACEMAKER INSERTION N/A 08/31/2013   Procedure: PERMANENT PACEMAKER INSERTION;  Surgeon: Coralyn Mark, MD;  Location: [facility] CATH  LAB;  Service: Cardiovascular;  Laterality: N/A;   TEE WITHOUT CARDIOVERSION N/A 03/15/2013   Procedure: TRANSESOPHAGEAL ECHOCARDIOGRAM (TEE);  Surgeon: Thayer Headings, MD;  Location[facility] ENDOSCOPY;  Service: Cardiovascular;  Laterality: N/A;     OB History   No obstetric history on file.     Family History  Problem Relation Age of Onset   Uterine cancer Mother    Heart disease Mother    Breast cancer Sister 40   Ovarian cancer Sister    Breast cancer Cousin 72   Diabetes Brother    Breast cancer Maternal Aunt    Ovarian cancer Sister     Social History   Tobacco Use   Smoking status: Former Smoker    Packs/day: 1.00    Years: 20.00    Pack years: 20.00    Types: Cigarettes    Quit date: 09/20/1998    Years since quitting: 21.2   Smokeless tobacco: Never Used   Tobacco comment: 1ppd x 20 years  Substance Use Topics   Alcohol use: Yes    Comment: rare, on holidays, and on special occasions per patient on 10/25/2019   Drug use: No    Home Medications Prior to Admission medications   Medication Sig Start Date End Date Taking? Authorizing Provider  albuterol (PROAIR HFA) 108 (90 Base) MCG/ACT inhaler Inhale 2 puffs into the lungs See admin instructions. Inhale 2 puffs into the lungs in the morning, 2 puffs at bedtime, and one to two times daily as needed for shortness of breath or wheezing    [provider]  allopurinol (ZYLOPRIM) 300 MG tablet TAKE 1 TABLET DAILY Patient taking differently: Take 300 mg by mouth daily.  05/17/17   Biagio Borg, MD  amitriptyline (ELAVIL) 100 MG tablet Take 1 tablet (100 mg total) by mouth at bedtime. 06/13/19   Biagio Borg, MD  apixaban (ELIQUIS) 5 MG TABS tablet Take 1 tablet (5 mg total) by mouth 2 (two) times daily. 10/02/19   Biagio Borg, MD  atorvastatin (LIPITOR) 40 MG tablet TAKE 1 TABLET DAILY Patient taking differently: Take 40 mg by mouth at bedtime.  09/26/18   Lelon Perla, MD  AZO-CRANBERRY PO Take 1  tablet by mouth daily as needed (urinary symptoms).     [provider]  B Complex Vitamins (VITAMIN B COMPLEX  PO) Take 1 tablet by mouth daily.    [provider]  budesonide-formoterol (SYMBICORT) 160-4.5 MCG/ACT inhaler Inhale 2 puffs into the lungs 2 (two) times daily. 10/22/19   Biagio Borg, MD  cholecalciferol (VITAMIN D3) 25 MCG (1000 UNIT) tablet Take 2,000 Units by mouth daily.    [provider]  clonazePAM (KLONOPIN) 0.5 MG tablet Take 1 tablet (0.5 mg total) by mouth 2 (two) times daily as needed for anxiety. 12/19/19   Biagio Borg, MD  DULoxetine (CYMBALTA) 30 MG capsule TAKE 2 CAPSULES (60 MG TOTAL) BY MOUTH AT BEDTIME. 12/17/19   Biagio Borg, MD  ferrous sulfate 325 (65 FE) MG tablet Take 325 mg by mouth at bedtime.    [provider]  furosemide (LASIX) 40 MG tablet Take 1 tablet (40 mg total) by mouth 2 (two) times daily. 11/06/19   Shelly Coss, MD  Guaifenesin (MUCINEX MAXIMUM STRENGTH) 1200 MG TB12 Take 1,200 mg by mouth 2 (two) times daily.    [provider]  levothyroxine (SYNTHROID) 200 MCG tablet Take 1 tablet (200 mcg total) by mouth daily. Annual appt due in Sept must see provider for future refills 12/13/19   Biagio Borg, MD  metoprolol tartrate (LOPRESSOR) 25 MG tablet Take 0.5 tablets (12.5 mg total) by mouth as needed (for heart rate above 120 at rest). 09/28/19   Lelon Perla, MD  omeprazole (PRILOSEC) 40 MG capsule Take 1 capsule (40 mg total) by mouth daily. 05/10/19   Domenic Moras, PA-C  OXYGEN Inhale 2-3 L/min into the lungs continuous.     [provider]  potassium chloride SA (KLOR-CON) 20 MEQ tablet Take 1 tablet (20 mEq total) by mouth 2 (two) times daily. 11/06/19   Shelly Coss, MD  traMADol (ULTRAM) 50 MG tablet Take 1 tablet (50 mg total) by mouth daily as needed for moderate pain. Patient taking differently: Take 50 mg by mouth every 6 (six) hours as needed for moderate pain.  10/02/19   Biagio Borg, MD  vitamin C (ASCORBIC ACID) 500 MG tablet Take 500 mg by mouth daily.     [provider]  VITAMIN E PO Take 1,000 Units by mouth daily.     [provider]    Allergies    Cephalexin, Ciprofloxacin, Lisinopril, Codeine, Latex, Tape, Cleocin [clindamycin hcl], Dilaudid [hydromorphone], Doxycycline, and Penicillins  Review of Systems   Review of Systems  Constitutional: Positive for fatigue. Negative for chills and fever.  HENT: Negative for ear pain and sore throat.   Eyes: Positive for pain. Negative for visual disturbance.  Respiratory: Negative for cough and shortness of breath.   Cardiovascular: Negative for chest pain and palpitations.  Gastrointestinal: Negative for abdominal pain and vomiting.  Genitourinary: Negative for dysuria and hematuria.  Musculoskeletal: Negative for arthralgias and back pain.  Skin: Positive for wound. Negative for color change and rash.  Neurological: Positive for weakness. Negative for dizziness, tremors, seizures, syncope, facial asymmetry, speech difficulty, light-headedness, numbness and headaches.  All other systems reviewed and are negative.   Physical Exam Updated Vital Signs BP 123/67 (BP Location: Right Arm)    Pulse 90    Temp (!) 97.1 F (36.2 C) (Rectal)    Resp 11    Ht 5' 7"  (1.702 m)    Wt 136.1 kg    SpO2 100%    BMI 46.99 kg/m   Physical Exam Vitals and nursing note reviewed.  Constitutional:  General: She is in acute distress.     Appearance: She is well-developed. She is obese. She is ill-appearing.  HENT:     Head: Normocephalic and atraumatic.     Nose: Nose normal.     Mouth/Throat:     Mouth: Mucous membranes are dry.  Eyes:     Conjunctiva/sclera: Conjunctivae normal.     Pupils: Pupils are equal, round, and reactive to light.  Cardiovascular:     Rate and Rhythm: Normal rate and regular rhythm.     Pulses: Normal pulses.     Heart sounds: Normal heart sounds. No murmur.   Pulmonary:     Effort: Pulmonary effort is normal. No respiratory distress.     Breath sounds: Rhonchi present.  Abdominal:     Palpations: Abdomen is soft.     Tenderness: There is no abdominal tenderness.  Musculoskeletal:     Cervical back: Neck supple.  Skin:    General: Skin is warm and dry.     Comments: Multiple pressure ulcers throughout that are superficial, worst one is on her right foot that is overall well-appearing, skin is slightly peeled off on the right foot, has abrasions to the right forehead, abrasion to the right forearm, redness throughout various other parts of her body but no severe skin breakdown overall superficial skin ulcers  Neurological:     General: No focal deficit present.     Mental Status: She is alert.     Comments: Patient appears to be moving all extremities, pupils are equal and reactive, follows commands, overall difficult to do full neuro exam due to habitus and discomfort     ED Results / Procedures / Treatments   Labs (all labs ordered are listed, but only abnormal results are displayed) Labs Reviewed  CBC WITH DIFFERENTIAL/PLATELET - Abnormal; Notable for the following components:      Result Value   MCV 106.4 (*)    MCHC 28.7 (*)    RDW 19.6 (*)    nRBC 1.7 (*)    Lymphs Abs 0.5 (*)    All other components within normal limits  BASIC METABOLIC PANEL - Abnormal; Notable for the following components:   Sodium 147 (*)    Potassium 5.7 (*)    Chloride 113 (*)    CO2 21 (*)    BUN 67 (*)    Creatinine, Ser 2.48 (*)    GFR calc non Af Amer 19 (*)    GFR calc Af Amer 22 (*)    All other components within normal limits  HEPATIC FUNCTION PANEL - Abnormal; Notable for the following components:   Total Protein 6.3 (*)    Albumin 3.3 (*)    AST 115 (*)    ALT 55 (*)    Alkaline Phosphatase 155 (*)    Total Bilirubin 2.9 (*)    Bilirubin, Direct 1.4 (*)    Indirect Bilirubin 1.5 (*)    All other components within normal limits  CK -  Abnormal; Notable for the following components:   Total CK 2,047 (*)    All other components within normal limits  TROPONIN I (HIGH SENSITIVITY) - Abnormal; Notable for the following components:   Troponin I (High Sensitivity) 65 (*)    All other components within normal limits  URINE CULTURE  SARS CORONAVIRUS 2 (TAT 6-24 HRS)  LIPASE, BLOOD  URINALYSIS, ROUTINE W REFLEX MICROSCOPIC    EKG EKG Interpretation  Date/Time:  Saturday December 29 2019 10:45:46 EDT Ventricular Rate:  93 PR Interval:    QRS Duration: 108 QT Interval:  369 QTC Calculation: 467 R Axis:   79 Text Interpretation: A-V dual-paced complexes w/ some inhibition No further analysis attempted due to paced rhythm Confirmed by Lennice Sites [phone]) on 01/11/2020 11:14:10 AM   Radiology CT Head Wo Contrast  Result Date: 12/23/2019 CLINICAL DATA:  Fall. EXAM: CT HEAD WITHOUT CONTRAST CT CERVICAL SPINE WITHOUT CONTRAST TECHNIQUE: Multidetector CT imaging of the head and cervical spine was performed following the standard protocol without intravenous contrast. Multiplanar CT image reconstructions of the cervical spine were also generated. COMPARISON:  None. FINDINGS: CT HEAD FINDINGS Brain: No acute intracranial hemorrhage. No focal mass lesion. No CT evidence of acute infarction. No midline shift or mass effect. No hydrocephalus. Basilar cisterns are patent. There are periventricular and subcortical white matter hypodensities. Generalized cortical atrophy. Vascular: No hyperdense vessel or unexpected calcification. Skull: Normal. Negative for fracture or focal lesion. Sinuses/Orbits: Paranasal sinuses and mastoid air cells are clear. Orbits are clear. Other: None. CT CERVICAL SPINE FINDINGS Alignment: Normal alignment of the cervical vertebral bodies. Skull base and vertebrae: Normal craniocervical junction. No loss of vertebral body height or disc height. Normal facet articulation. No evidence of fracture. Soft tissues and spinal  canal: No prevertebral soft tissue swelling. No perispinal or epidural hematoma. Disc levels: Anterior cervical fusion C5-C6. Bulky osteophytes at C3-C4. No subluxation. Additional osteophytosis from C5-T1. Upper chest: Clear Other: None IMPRESSION: 1. No acute intracranial findings. 2. Mild atrophy and white matter microvascular disease. 3. No cervical spine fracture. 4. Stable anterior cervical fusion. Electronically Signed   By: Suzy Bouchard M.D.   On: 12/30/2019 12:09   CT Cervical Spine Wo Contrast  Result Date: 01/13/2020 CLINICAL DATA:  Fall. EXAM: CT HEAD WITHOUT CONTRAST CT CERVICAL SPINE WITHOUT CONTRAST TECHNIQUE: Multidetector CT imaging of the head and cervical spine was performed following the standard protocol without intravenous contrast. Multiplanar CT image reconstructions of the cervical spine were also generated. COMPARISON:  None. FINDINGS: CT HEAD FINDINGS Brain: No acute intracranial hemorrhage. No focal mass lesion. No CT evidence of acute infarction. No midline shift or mass effect. No hydrocephalus. Basilar cisterns are patent. There are periventricular and subcortical white matter hypodensities. Generalized cortical atrophy. Vascular: No hyperdense vessel or unexpected calcification. Skull: Normal. Negative for fracture or focal lesion. Sinuses/Orbits: Paranasal sinuses and mastoid air cells are clear. Orbits are clear. Other: None. CT CERVICAL SPINE FINDINGS Alignment: Normal alignment of the cervical vertebral bodies. Skull base and vertebrae: Normal craniocervical junction. No loss of vertebral body height or disc height. Normal facet articulation. No evidence of fracture. Soft tissues and spinal canal: No prevertebral soft tissue swelling. No perispinal or epidural hematoma. Disc levels: Anterior cervical fusion C5-C6. Bulky osteophytes at C3-C4. No subluxation. Additional osteophytosis from C5-T1. Upper chest: Clear Other: None IMPRESSION: 1. No acute intracranial findings. 2.  Mild atrophy and white matter microvascular disease. 3. No cervical spine fracture. 4. Stable anterior cervical fusion. Electronically Signed   By: Suzy Bouchard M.D.   On: 01/03/2020 12:09   DG Pelvis Portable  Result Date: 12/28/2019 CLINICAL DATA:  Fall. Unsure how long patient was on the floor unattended. pain EXAM: PORTABLE PELVIS 1-2 VIEWS COMPARISON:  None. FINDINGS: Hip hips located. No pelvic fracture. No hip fracture. Degenerative changes at the hip joints IMPRESSION: No acute pelvic findings.  Degenerative change of the hips. Electronically Signed   By: Suzy Bouchard M.D.   On: 01/07/2020 11:35   DG Chest  Portable 1 View  Result Date: 01/11/2020 CLINICAL DATA:  Fall. Unsure how long patient was on the floor unattended. pain EXAM: PORTABLE CHEST 1 VIEW COMPARISON:  11/23/2019 FINDINGS: Enlarged cardiac silhouette. No effusion, infiltrate pneumothorax. Prominent hilar vasculature. Anterior cervical fusion IMPRESSION: Cardiomegaly without acute findings.  No change from Electronically Signed   By: Suzy Bouchard M.D.   On: 12/31/2019 11:34    Procedures .Critical Care Performed by: Lennice Sites, DO Authorized by: Lennice Sites, DO   Critical care provider statement:    Critical care time (minutes):  35   Critical care was necessary to treat or prevent imminent or life-threatening deterioration of the following conditions:  Dehydration and renal failure   Critical care was time spent personally by me on the following activities:  Blood draw for specimens, development of treatment plan with patient or surrogate, discussions with primary provider, evaluation of patient's response to treatment, examination of patient, obtaining history from patient or surrogate, ordering and performing treatments and interventions, ordering and review of laboratory studies, ordering and review of radiographic studies, pulse oximetry, re-evaluation of patient's condition and review of old charts   I  assumed direction of critical care for this patient from another provider in my specialty: no     (including critical care time)  Medications Ordered in ED Medications  sodium zirconium cyclosilicate (LOKELMA) packet 10 g (0 g Oral Hold 12/21/2019 1239)  sodium chloride 0.9 % bolus 1,000 mL (1,000 mLs Intravenous New Bag/Given 12/28/2019 1102)    ED Course  I have reviewed the triage vital signs and the nursing notes.  Pertinent labs & imaging results that were available during my care of the patient were reviewed by me and considered in my medical decision making (see chart for details).    MDM Rules/Calculators/A&P                      [name] [name] is a 74 year old female with history of A. fib on Eliquis, hypertension, CAD, high cholesterol who presents to the ED after being found down on the floor at her apartment.  Patient states that she last remembers it being [place] which was 6 days ago.  She has been on the floor ever since.  Patient with overall normal vitals.  Clinically she appears dehydrated.  She has multiple areas of skin breakdown that appear to be consistent with pressure ulcers from being on the floor.  Patient was incontinence of stool and urine at the scene for EMS.  Patient is covered in feces upon arrival.  Surprisingly her mental status is fairly stable.  She is on 5 L of oxygen.  She normally wears 2.  Patient clinically looks dehydrated and likely has rhabdo.  Will give fluid bolus, initiate infectious work-up.  Lab work shows AKI with a creatinine of 2.48.  Potassium 5.7.  Sodium 147.  CKs 2000, troponin 65.  EKG reassuring.  No ischemic changes.  CT scan of her head and neck unremarkable.  Chest x-ray pelvic x-ray showed no signs of fracture.  No infectious findings.  Overall patient likely with rhabdomyolysis from dehydration and immobilization.  Patient to be admitted for further hydration, physical therapy.  This chart was dictated using voice recognition software.   Despite best efforts to proofread,  errors can occur which can change the documentation meaning.    Final Clinical Impression(s) / ED Diagnoses Final diagnoses:  AKI (acute kidney injury) ([place])  Non-traumatic rhabdomyolysis    Rx /  DC Orders ED Discharge Orders    None       Lennice Sites, DO 12/21/2019 1358

## 2019-12-29 NOTE — Progress Notes (Signed)
Pt arrived to 3w26. Not following commands, not verbally responsive. Pupils sluggish. Vitals stable, pt on 10L nonrebreather mask. Telemetry verified. MD Zhang notified pt not safe for PO intake at this time, asked about PO medications via page at 1626.  Large abrasion on R side of face, as well as blisters and skin tears on upper and lower extremities weeping serosanguinous fluid. MASD on groin and buttocks weeping serosanguinous fluid. Wounds cleansed and dressed with petroleum gauze and foam, prevalon boots on feet bilaterally.  Pt due to void, will bladder scan at 1845 (6hr post last in and out cath) to assess for retention.

## 2019-12-29 NOTE — ED Triage Notes (Signed)
Per ems, pt from home. Pt lives independently had a fall, unknown downtime on floor. Pt's door was open for a few days, neighbors did not see her for days, mailbox full. Pt knows she was up on Easter. Pt found face down on floor in feces. Pt states her name and birthday, otherwise not oriented. Swelling noted to face with red, swollen, busted eye. Pt has skin tear noted to right face, right elbow, and right foot. Pt right foot skin tear from metal hinge on table. Blister on right ankle the size of an tangerine popped in triage, another blister noted to left ankle. Skin breakdown on pt's stomach and groin. Pt wears oxygen at baseline, unknown amount. Pt was 80% on room air with ems arrival. C-collar towel roll placed on pt by ems. 22 R hand by ems.   EMS VS CBG 123, BP 140/70, HR 80s, 12 lead unremarkable.

## 2019-12-29 NOTE — ED Notes (Signed)
Skin tear to right elbow irrigated and dressed with Xeroform and gauze.

## 2019-12-29 NOTE — Progress Notes (Signed)
ANTICOAGULATION CONSULT NOTE - Initial Consult  Pharmacy Consult for heparin Indication: atrial fibrillation  Allergies  Allergen Reactions  . Cephalexin Other (See Comments)    Exfoliative dermatitis reaction  . Ciprofloxacin Other (See Comments)    Folliculitis   . Lisinopril Cough  . Codeine     REACTION: unknown - pt. states unaware of having reaction to codeine  . Latex Dermatitis  . Tape Dermatitis  . Cleocin [Clindamycin Hcl] Rash  . Dilaudid [Hydromorphone] Other (See Comments)    Oversedation  . Doxycycline Rash  . Penicillins Rash    Did it involve swelling of the face/tongue/throat, SOB, or low BP? No Did it involve sudden or severe rash/hives, skin peeling, or any reaction on the inside of your mouth or nose? Yes Did you need to seek medical attention at a hospital or doctor's office?Yes When did it last happen?several years ago If all above answers are "NO", may proceed with cephalosporin use.    Patient Measurements: Height: 5' 7"  (170.2 cm) Weight: 136.1 kg (300 lb) IBW/kg (Calculated) : 61.6 Heparin Dosing Weight: 94.7 kg  Vital Signs: Temp: 97.5 F (36.4 C) (04/10 1540) Temp Source: Axillary (04/10 1540) BP: 129/64 (04/10 1540) Pulse Rate: 92 (04/10 1540)  Labs: Recent Labs    12/26/2019 1007 01/15/2020 1423  HGB 12.9 14.6  HCT 44.9 43.0  PLT 171  --   CREATININE 2.48*  --   CKTOTAL 2,047*  --   TROPONINIHS 65*  --     Estimated Creatinine Clearance: 29.2 mL/min (A) (by C-G formula based on SCr of 2.48 mg/dL (H)).   Medical History: Past Medical History:  Diagnosis Date  . Anemia   . Asthma 06/13/2011   hx of  . Atrial fibrillation ([place])   . Atrial flutter ([place])   . Breast cancer ([place]) dx'd 2005   No BP check or stick in Left arm  . CAD (coronary artery disease)    a. Nonobstructive CAD 2007 (EF 75%.  RCA  proximal 75% tubular, 25% prox LAD, 25% d1, 50% D2) at [facility]. b. NSTEMI 01/2010 in setting of respiratory failure, medical  approach (not a good candidate for noninvasive eval)  . Cellulitis    hx of cellulitis in left leg  . Cervical radiculopathy 06/13/2011  . CHF (congestive heart failure) ([place])   . Complication of anesthesia    per patient "hard to wake up and come out of the aneshthesia, happened years ago"  . COPD (chronic obstructive pulmonary disease) ([place])    on O2  . Depression 06/13/2011  . Diverticulosis   . Esophageal dysmotility   . Fatty liver   . Gout 06/13/2011  . History of Clostridium difficile early dec 2015   no current diarrhea  . History of home oxygen therapy    2 liters per nasal cannula all the time  . Hx of dysfunctional uterine bleeding    last bleeding jan 2016, worked up for with d and c x 2 no cause found  . Hyperlipidemia   . Hypertension   . Hypothyroidism   . Impaired glucose tolerance 11/01/2012  . Myocardial infarction ([place])   . Neuropathy 06/13/2011  . Neuropathy    per patient in hands and in feet  . Obesity hypoventilation syndrome ([place]) 06/13/2011  . OSA on CPAP   . Personal history of chemotherapy   . Personal history of radiation therapy   . PFO (patent foramen ovale)   . Presence of permanent cardiac pacemaker   .  Risk for falls   . Syncope and collapse    Bradycardia with pauses. S/P pacemaker  . Tubular adenoma of colon     Medications:  Scheduled:  . albuterol  2 puff Inhalation See admin instructions  . allopurinol  300 mg Oral Daily  . DULoxetine  60 mg Oral QHS  . fluticasone furoate-vilanterol  1 puff Inhalation Daily  . Guaifenesin  1,200 mg Oral BID  . levothyroxine  100 mcg Intravenous Daily  . pantoprazole  40 mg Oral Daily  . sodium zirconium cyclosilicate  10 g Oral Once  . Vitamin B Complex   Oral Daily    Assessment: 32 yof found on the floor with pressure ulcers on R side of body. Negative findings on imaging. On apixaban PTA (LD unknown) - ordered baseline labs.  CK total 2k, in AKI w/ Scr 2.48 (was ~1 in February). Hgb 14.6, plt  171. No s/sx of bleeding documented.   Goal of Therapy:  Heparin level 0.3-0.7 units/ml Monitor platelets by anticoagulation protocol: Yes   Plan:  Start heparin infusion at 1350 units/hr Check anti-Xa level in 8 hours and daily while on heparin Continue to monitor H&H and platelets  Antonietta Jewel, PharmD, [place] Pharmacist  Phone: [phone] 12/25/2019 5:29 PM  Please check AMION for all [place] phone numbers After 10:00 PM, call [place] [phone]

## 2019-12-29 NOTE — ED Notes (Signed)
Bair hugger applied to pt.

## 2019-12-29 NOTE — H&P (Signed)
History and Physical    [name] [name] HTD:[id number] DOB: [birth year] DOA: 01/13/2020  PCP: Biagio Borg, MD (Confirm with patient/family/NH records and if not entered, this has to be entered at [facility] point of entry) Patient coming from: Home  I have personally briefly reviewed patient's old medical records in [place]  Chief Complaint: [place] down  HPI: [name] [name] is a 74 y.o. female with medical history significant of COPD chronically on 2 L oxygen, chronic diastolic CHF, sleep apnea, paroxysmal A. fib, chronic anemia, CKD stage III with baseline creatinine of 1.3, sick sinus syndrome status post PPM, hypothyroidism presented with Fall.  Patient lives by herself, and she is morbidly obese and had a fall episode about 6 days ago.  Patient was not able to stand up herself and she stayed on lying for the last 6 days.  Neighbor found her mailbox were full of packages called EMS.  EMS found patient on the floor with pressure ulcers on the right side of her body.  EMS found her 80% on room air.  Patient states whole body aching.  Denies any chest pain, shortness of breath.  ED Course: She was found to hypoxia, CK in 2000, AKI 2.48, VBG showed no significant CO2 retention.  Review of Systems: Unable to perform patient confused Past Medical History:  Diagnosis Date  . Anemia   . Asthma 06/13/2011   hx of  . Atrial fibrillation ([place])   . Atrial flutter ([place])   . Breast cancer ([place]) dx'd 2005   No BP check or stick in Left arm  . CAD (coronary artery disease)    a. Nonobstructive CAD 2007 (EF 75%.  RCA  proximal 75% tubular, 25% prox LAD, 25% d1, 50% D2) at [facility]. b. NSTEMI 01/2010 in setting of respiratory failure, medical approach (not a good candidate for noninvasive eval)  . Cellulitis    hx of cellulitis in left leg  . Cervical radiculopathy 06/13/2011  . CHF (congestive heart failure) ([place])   . Complication of anesthesia    per patient "hard to wake up and come out of the  aneshthesia, happened years ago"  . COPD (chronic obstructive pulmonary disease) ([place])    on O2  . Depression 06/13/2011  . Diverticulosis   . Esophageal dysmotility   . Fatty liver   . Gout 06/13/2011  . History of Clostridium difficile early dec 2015   no current diarrhea  . History of home oxygen therapy    2 liters per nasal cannula all the time  . Hx of dysfunctional uterine bleeding    last bleeding jan 2016, worked up for with d and c x 2 no cause found  . Hyperlipidemia   . Hypertension   . Hypothyroidism   . Impaired glucose tolerance 11/01/2012  . Myocardial infarction ([place])   . Neuropathy 06/13/2011  . Neuropathy    per patient in hands and in feet  . Obesity hypoventilation syndrome ([place]) 06/13/2011  . OSA on CPAP   . Personal history of chemotherapy   . Personal history of radiation therapy   . PFO (patent foramen ovale)   . Presence of permanent cardiac pacemaker   . Risk for falls   . Syncope and collapse    Bradycardia with pauses. S/P pacemaker  . Tubular adenoma of colon     Past Surgical History:  Procedure Laterality Date  . APPENDECTOMY    . BREAST BIOPSY    . BREAST LUMPECTOMY Left 05/2004  .  CARDIOVERSION N/A 03/15/2013   Procedure: CARDIOVERSION;  Surgeon: Thayer Headings, MD;  Location: [facility] ENDOSCOPY;  Service: Cardiovascular;  Laterality: N/A;  . CERVICAL BIOPSY  June 2013   at [facility] for Heavy  Bleeding  . [place] SURGERY  2004 or 2005   have cadaver bones,, screws, and plates c 5 to c 6  . COLONOSCOPY    . COLONOSCOPY WITH PROPOFOL N/A 10/22/2014   Procedure: COLONOSCOPY WITH PROPOFOL;  Surgeon: Jerene Bears, MD;  Location: [facility] ENDOSCOPY;  Service: Gastroenterology;  Laterality: N/A;  . DILATION AND CURETTAGE OF UTERUS    . EPIGASTRIC HERNIA REPAIR    . EPIGASTRIC HERNIA REPAIR N/A 10/29/2019   Procedure: EPIGASTRIC HERNIA REPAIR WITH MESH;  Surgeon: Coralie Keens, MD;  Location: [facility];  Service: General;  Laterality: N/A;  LMA ANESTHESIA  .  EYE SURGERY    . LAPAROSCOPIC APPENDECTOMY  03/29/2012   Procedure: APPENDECTOMY LAPAROSCOPIC;  Surgeon: Adin Hector, MD;  Location: [facility] ORS;  Service: General;  Laterality: N/A;  . LOOP RECORDER EXPLANT  08/31/2013   Procedure: LOOP RECORDER EXPLANT;  Surgeon: Coralyn Mark, MD;  Location: [facility] CATH LAB;  Service: Cardiovascular;;  . LOOP RECORDER IMPLANT  08-28-2013; 08-31-2013   MDT LinQ implanted by Dr Rayann Heman for syncope; explanted 08-31-2013 after sinus pauses identified  . LOOP RECORDER IMPLANT N/A 08/28/2013   Procedure: LOOP RECORDER IMPLANT;  Surgeon: Coralyn Mark, MD;  Location: [facility] CATH LAB;  Service: Cardiovascular;  Laterality: N/A;  . OVARIAN CYST REMOVAL    . PACEMAKER INSERTION  08-31-2013   Biotronik Evera dual chamber pacemaker placed for neurocardiogenic syncope and sinus pauses by Dr Rayann Heman  . PERMANENT PACEMAKER INSERTION N/A 08/31/2013   Procedure: PERMANENT PACEMAKER INSERTION;  Surgeon: Coralyn Mark, MD;  Location: [facility] CATH LAB;  Service: Cardiovascular;  Laterality: N/A;  . TEE WITHOUT CARDIOVERSION N/A 03/15/2013   Procedure: TRANSESOPHAGEAL ECHOCARDIOGRAM (TEE);  Surgeon: Thayer Headings, MD;  Location: [facility] ENDOSCOPY;  Service: Cardiovascular;  Laterality: N/A;     reports that she quit smoking about 21 years ago. Her smoking use included cigarettes. She has a 20.00 pack-year smoking history. She has never used smokeless tobacco. She reports current alcohol use. She reports that she does not use drugs.  Allergies  Allergen Reactions  . Cephalexin Other (See Comments)    Exfoliative dermatitis reaction  . Ciprofloxacin Other (See Comments)    Folliculitis   . Lisinopril Cough  . Codeine     REACTION: unknown - pt. states unaware of having reaction to codeine  . Latex Dermatitis  . Tape Dermatitis  . Cleocin [Clindamycin Hcl] Rash  . Dilaudid [Hydromorphone] Other (See Comments)    Oversedation  . Doxycycline Rash  . Penicillins Rash    Did it involve  swelling of the face/tongue/throat, SOB, or low BP? No Did it involve sudden or severe rash/hives, skin peeling, or any reaction on the inside of your mouth or nose? Yes Did you need to seek medical attention at a hospital or doctor's office?Yes When did it last happen?several years ago If all above answers are "NO", may proceed with cephalosporin use.    Family History  Problem Relation Age of Onset  . Uterine cancer Mother   . Heart disease Mother   . Breast cancer Sister 59  . Ovarian cancer Sister   . Breast cancer Cousin 59  . Diabetes Brother   . Breast cancer Maternal Aunt   . Ovarian cancer Sister  Prior to Admission medications   Medication Sig Start Date End Date Taking? Authorizing Provider  apixaban (ELIQUIS) 5 MG TABS tablet Take 1 tablet (5 mg total) by mouth 2 (two) times daily. 10/02/19  Yes Biagio Borg, MD  AZO-CRANBERRY PO Take 1 tablet by mouth daily as needed (urinary symptoms).    Yes [provider]  B Complex Vitamins (VITAMIN B COMPLEX PO) Take 1 tablet by mouth daily.   Yes [provider]  budesonide-formoterol (SYMBICORT) 160-4.5 MCG/ACT inhaler Inhale 2 puffs into the lungs 2 (two) times daily. 10/22/19  Yes Biagio Borg, MD  cholecalciferol (VITAMIN D3) 25 MCG (1000 UNIT) tablet Take 2,000 Units by mouth daily.   Yes [provider]  diphenhydrAMINE (BENADRYL) 25 MG tablet Take 25 mg by mouth every 6 (six) hours as needed for allergies.   Yes [provider]  hydrOXYzine (ATARAX/VISTARIL) 25 MG tablet Take 25 mg by mouth 3 (three) times daily as needed for itching.   Yes [provider]  OXYGEN Inhale 2-3 L/min into the lungs continuous.    Yes [provider]  potassium chloride SA (KLOR-CON) 20 MEQ tablet Take 1 tablet (20 mEq total) by mouth 2 (two) times daily. 11/06/19  Yes Shelly Coss, MD  traMADol (ULTRAM) 50 MG tablet Take 1 tablet (50 mg total) by mouth daily as needed for moderate  pain. Patient taking differently: Take 50 mg by mouth every 6 (six) hours as needed for moderate pain.  10/02/19  Yes Biagio Borg, MD  vitamin C (ASCORBIC ACID) 500 MG tablet Take 500 mg by mouth daily.    Yes [provider]  VITAMIN E PO Take 1,000 Units by mouth daily.    Yes [provider]  albuterol (PROAIR HFA) 108 (90 Base) MCG/ACT inhaler Inhale 2 puffs into the lungs See admin instructions. Inhale 2 puffs into the lungs in the morning, 2 puffs at bedtime, and one to two times daily as needed for shortness of breath or wheezing    [provider]  allopurinol (ZYLOPRIM) 300 MG tablet TAKE 1 TABLET DAILY Patient taking differently: Take 300 mg by mouth daily.  05/17/17   Biagio Borg, MD  amitriptyline (ELAVIL) 100 MG tablet Take 1 tablet (100 mg total) by mouth at bedtime. 06/13/19   Biagio Borg, MD  atorvastatin (LIPITOR) 40 MG tablet TAKE 1 TABLET DAILY Patient taking differently: Take 40 mg by mouth at bedtime.  09/26/18   Lelon Perla, MD  clonazePAM (KLONOPIN) 0.5 MG tablet Take 1 tablet (0.5 mg total) by mouth 2 (two) times daily as needed for anxiety. 12/19/19   Biagio Borg, MD  DULoxetine (CYMBALTA) 30 MG capsule TAKE 2 CAPSULES (60 MG TOTAL) BY MOUTH AT BEDTIME. 12/17/19   Biagio Borg, MD  furosemide (LASIX) 40 MG tablet Take 1 tablet (40 mg total) by mouth 2 (two) times daily. Patient taking differently: Take 20-40 mg by mouth See admin instructions. Take 1 tablet by mouth every morning and then take 1/2 tablet by mouth every evening 11/06/19   Shelly Coss, MD  levothyroxine (SYNTHROID) 200 MCG tablet Take 1 tablet (200 mcg total) by mouth daily. Annual appt due in Sept must see provider for future refills 12/13/19   Biagio Borg, MD  metoprolol tartrate (LOPRESSOR) 25 MG tablet Take 0.5 tablets (12.5 mg total) by mouth as needed (for heart rate above 120 at rest). Patient not taking: Reported on 12/25/2019 09/28/19   Stanford Breed,  Denice Bors, MD   omeprazole (PRILOSEC) 40 MG capsule Take 1 capsule (40 mg total) by mouth daily. Patient not taking: Reported on 12/24/2019 05/10/19   Domenic Moras, PA-C    Physical Exam: Vitals:   12/30/2019 1315 12/26/2019 1345 01/01/2020 1415 01/12/2020 1445  BP: 121/66 124/69 (!) 128/104 (!) 108/55  Pulse: 90 90  89  Resp: 11 14 19 16   Temp:      TempSrc:      SpO2: 98% 97%  100%  Weight:      Height:        Constitutional: NAD, calm, comfortable Vitals:   01/15/2020 1315 01/08/2020 1345 01/01/2020 1415 01/02/2020 1445  BP: 121/66 124/69 (!) 128/104 (!) 108/55  Pulse: 90 90  89  Resp: 11 14 19 16   Temp:      TempSrc:      SpO2: 98% 97%  100%  Weight:      Height:       Eyes: PERRL, lids and conjunctivae normal ENMT: Mucous membranes are dry. Posterior pharynx clear of any exudate or lesions.Normal dentition.  Neck: normal, supple, no masses, no thyromegaly Respiratory: clear to auscultation bilaterally, no wheezing, no crackles. Normal respiratory effort. No accessory muscle use.  Cardiovascular: Regular rate and rhythm, no murmurs / rubs / gallops. 2+ extremity edema. 2+ pedal pulses. No carotid bruits.  Abdomen: no tenderness, no masses palpated. No hepatosplenomegaly. Bowel sounds positive.  Musculoskeletal: no clubbing / cyanosis. No joint deformity upper and lower extremities. Good ROM, no contractures. Normal muscle tone.  Skin: no rashes, lesions, ulcers. No induration Neurologic: Moving all limbs, following simple commands.  Psychiatric: Responds to voice, sleepy.     Labs on Admission: I have personally reviewed following labs and imaging studies  CBC: Recent Labs  Lab 12/24/2019 1007 01/14/2020 1423  WBC 6.9  --   NEUTROABS 5.5  --   HGB 12.9 14.6  HCT 44.9 43.0  MCV 106.4*  --   PLT 171  --    Basic Metabolic Panel: Recent Labs  Lab 01/12/2020 1007 01/05/2020 1423  NA 147* 147*  K 5.7* 5.6*  CL 113*  --   CO2 21*  --   GLUCOSE 99  --   BUN 67*  --   CREATININE 2.48*  --    CALCIUM 8.9  --    GFR: Estimated Creatinine Clearance: 29.2 mL/min (A) (by C-G formula based on SCr of 2.48 mg/dL (H)). Liver Function Tests: Recent Labs  Lab 12/20/2019 1007  AST 115*  ALT 55*  ALKPHOS 155*  BILITOT 2.9*  PROT 6.3*  ALBUMIN 3.3*   Recent Labs  Lab 01/03/2020 1007  LIPASE 19   No results for input(s): AMMONIA in the last 168 hours. Coagulation Profile: No results for input(s): INR, PROTIME in the last 168 hours. Cardiac Enzymes: Recent Labs  Lab 12/27/2019 1007  CKTOTAL 2,047*   BNP (last 3 results) No results for input(s): PROBNP in the last 8760 hours. HbA1C: No results for input(s): HGBA1C in the last 72 hours. CBG: No results for input(s): GLUCAP in the last 168 hours. Lipid Profile: No results for input(s): CHOL, HDL, LDLCALC, TRIG, CHOLHDL, LDLDIRECT in the last 72 hours. Thyroid Function Tests: Recent Labs    01/04/2020 1418  TSH 6.633*   Anemia Panel: No results for input(s): VITAMINB12, FOLATE, FERRITIN, TIBC, IRON, RETICCTPCT in the last 72 hours. Urine analysis:    Component Value Date/Time   COLORURINE AMBER (A) 12/21/2019 1309   APPEARANCEUR CLOUDY (  A) 01/14/2020 1309   LABSPEC 1.012 01/13/2020 1309   PHURINE 5.0 12/25/2019 1309   GLUCOSEU NEGATIVE 12/28/2019 1309   GLUCOSEU NEGATIVE 11/26/2019 0825   HGBUR LARGE (A) 01/02/2020 1309   BILIRUBINUR NEGATIVE 01/05/2020 1309   KETONESUR NEGATIVE 01/03/2020 1309   PROTEINUR 100 (A) 12/27/2019 1309   UROBILINOGEN 0.2 11/26/2019 0825   NITRITE NEGATIVE 01/15/2020 1309   LEUKOCYTESUR NEGATIVE 12/24/2019 1309    Radiological Exams on Admission: CT Head Wo Contrast  Result Date: 01/03/2020 CLINICAL DATA:  Fall. EXAM: CT HEAD WITHOUT CONTRAST CT CERVICAL SPINE WITHOUT CONTRAST TECHNIQUE: Multidetector CT imaging of the head and cervical spine was performed following the standard protocol without intravenous contrast. Multiplanar CT image reconstructions of the cervical spine were also  generated. COMPARISON:  None. FINDINGS: CT HEAD FINDINGS Brain: No acute intracranial hemorrhage. No focal mass lesion. No CT evidence of acute infarction. No midline shift or mass effect. No hydrocephalus. Basilar cisterns are patent. There are periventricular and subcortical white matter hypodensities. Generalized cortical atrophy. Vascular: No hyperdense vessel or unexpected calcification. Skull: Normal. Negative for fracture or focal lesion. Sinuses/Orbits: Paranasal sinuses and mastoid air cells are clear. Orbits are clear. Other: None. CT CERVICAL SPINE FINDINGS Alignment: Normal alignment of the cervical vertebral bodies. Skull base and vertebrae: Normal craniocervical junction. No loss of vertebral body height or disc height. Normal facet articulation. No evidence of fracture. Soft tissues and spinal canal: No prevertebral soft tissue swelling. No perispinal or epidural hematoma. Disc levels: Anterior cervical fusion C5-C6. Bulky osteophytes at C3-C4. No subluxation. Additional osteophytosis from C5-T1. Upper chest: Clear Other: None IMPRESSION: 1. No acute intracranial findings. 2. Mild atrophy and white matter microvascular disease. 3. No cervical spine fracture. 4. Stable anterior cervical fusion. Electronically Signed   By: Suzy Bouchard M.D.   On: 01/05/2020 12:09   CT Cervical Spine Wo Contrast  Result Date: 01/06/2020 CLINICAL DATA:  Fall. EXAM: CT HEAD WITHOUT CONTRAST CT CERVICAL SPINE WITHOUT CONTRAST TECHNIQUE: Multidetector CT imaging of the head and cervical spine was performed following the standard protocol without intravenous contrast. Multiplanar CT image reconstructions of the cervical spine were also generated. COMPARISON:  None. FINDINGS: CT HEAD FINDINGS Brain: No acute intracranial hemorrhage. No focal mass lesion. No CT evidence of acute infarction. No midline shift or mass effect. No hydrocephalus. Basilar cisterns are patent. There are periventricular and subcortical white  matter hypodensities. Generalized cortical atrophy. Vascular: No hyperdense vessel or unexpected calcification. Skull: Normal. Negative for fracture or focal lesion. Sinuses/Orbits: Paranasal sinuses and mastoid air cells are clear. Orbits are clear. Other: None. CT CERVICAL SPINE FINDINGS Alignment: Normal alignment of the cervical vertebral bodies. Skull base and vertebrae: Normal craniocervical junction. No loss of vertebral body height or disc height. Normal facet articulation. No evidence of fracture. Soft tissues and spinal canal: No prevertebral soft tissue swelling. No perispinal or epidural hematoma. Disc levels: Anterior cervical fusion C5-C6. Bulky osteophytes at C3-C4. No subluxation. Additional osteophytosis from C5-T1. Upper chest: Clear Other: None IMPRESSION: 1. No acute intracranial findings. 2. Mild atrophy and white matter microvascular disease. 3. No cervical spine fracture. 4. Stable anterior cervical fusion. Electronically Signed   By: Suzy Bouchard M.D.   On: 12/26/2019 12:09   DG Pelvis Portable  Result Date: 12/23/2019 CLINICAL DATA:  Fall. Unsure how long patient was on the floor unattended. pain EXAM: PORTABLE PELVIS 1-2 VIEWS COMPARISON:  None. FINDINGS: Hip hips located. No pelvic fracture. No hip fracture. Degenerative changes at the hip joints  IMPRESSION: No acute pelvic findings.  Degenerative change of the hips. Electronically Signed   By: Suzy Bouchard M.D.   On: 12/28/2019 11:35   DG Chest Portable 1 View  Result Date: 12/20/2019 CLINICAL DATA:  Fall. Unsure how long patient was on the floor unattended. pain EXAM: PORTABLE CHEST 1 VIEW COMPARISON:  11/23/2019 FINDINGS: Enlarged cardiac silhouette. No effusion, infiltrate pneumothorax. Prominent hilar vasculature. Anterior cervical fusion IMPRESSION: Cardiomegaly without acute findings.  No change from Electronically Signed   By: Suzy Bouchard M.D.   On: 12/28/2019 11:34    EKG: Independently reviewed.    Assessment/Plan Active Problems:   AMS (altered mental status)   Metabolic encephalopathy -VBG showed normal pH and normal level of PCO2 (compared to her past, patient has chronic CO2 retention) -TSH mildly dilated, but overall low suspicion for myxedema -Check pro calcitonin  Acute on chronic hypoxic respiratory failure She was found face down, no cough, no fever, no significant new infiltrates on x-ray, will monitor off antibiotics Check procalcitonin Patient has a history of A. fib on Eliquis, will switch to heparin drip for now given her mentation status.  AKI From rhabdomyolysis and dehydration Check renal ultrasound Maintenance IV fluid, recheck BMP in the morning  Rhabdomyolysis Start IV fluid recheck CK morning,  Hypernatremia From dehydration, start maintenance IV fluids  Peripheral edema Check DVT studies, not sure how much long she was not able to take her Eliquis. Also send an echocardiogram  Transaminitis Noted related to rhabdomyolysis, with increase of bilirubin level, will also check a RUQ ultrasound  A. Fib Nurse report patient not awake enough to swallow pills, will switch to heparin drip for now, until DVT study done.    DVT prophylaxis: Heparin drip for now Code Status: Full code Family Communication: Left daughter message Disposition Plan: PT evaluation once more awake, overall may need placement/supervised living Consults called: None Admission status: PCU   Lequita Halt MD [facility] Pager [phone]   12/31/2019, 3:16 PM

## 2019-12-29 NOTE — ED Notes (Signed)
X-ray at bedside

## 2019-12-30 ENCOUNTER — Inpatient Hospital Stay (HOSPITAL_COMMUNITY): Payer: Medicare HMO

## 2019-12-30 ENCOUNTER — Encounter (HOSPITAL_COMMUNITY): Payer: Self-pay | Admitting: Internal Medicine

## 2019-12-30 DIAGNOSIS — I351 Nonrheumatic aortic (valve) insufficiency: Secondary | ICD-10-CM | POA: Diagnosis not present

## 2019-12-30 DIAGNOSIS — R0602 Shortness of breath: Secondary | ICD-10-CM | POA: Diagnosis not present

## 2019-12-30 DIAGNOSIS — N179 Acute kidney failure, unspecified: Secondary | ICD-10-CM | POA: Diagnosis not present

## 2019-12-30 DIAGNOSIS — I350 Nonrheumatic aortic (valve) stenosis: Secondary | ICD-10-CM | POA: Diagnosis not present

## 2019-12-30 LAB — BASIC METABOLIC PANEL
Anion gap: 14 (ref 5–15)
Anion gap: 15 (ref 5–15)
Anion gap: 15 (ref 5–15)
Anion gap: 10 (ref 5–15)
Anion gap: 11 (ref 5–15)
BUN: 70 mg/dL — ABNORMAL HIGH (ref 8–23)
BUN: 70 mg/dL — ABNORMAL HIGH (ref 8–23)
BUN: 74 mg/dL — ABNORMAL HIGH (ref 8–23)
BUN: 73 mg/dL — ABNORMAL HIGH (ref 8–23)
BUN: 72 mg/dL — ABNORMAL HIGH (ref 8–23)
CO2: 23 mmol/L (ref 22–32)
CO2: 22 mmol/L (ref 22–32)
CO2: 21 mmol/L — ABNORMAL LOW (ref 22–32)
CO2: 23 mmol/L (ref 22–32)
CO2: 25 mmol/L (ref 22–32)
Calcium: 9.2 mg/dL (ref 8.9–10.3)
Calcium: 9.1 mg/dL (ref 8.9–10.3)
Calcium: 9 mg/dL (ref 8.9–10.3)
Calcium: 8.6 mg/dL — ABNORMAL LOW (ref 8.9–10.3)
Calcium: 8.5 mg/dL — ABNORMAL LOW (ref 8.9–10.3)
Chloride: 115 mmol/L — ABNORMAL HIGH (ref 98–111)
Chloride: 115 mmol/L — ABNORMAL HIGH (ref 98–111)
Chloride: 115 mmol/L — ABNORMAL HIGH (ref 98–111)
Chloride: 113 mmol/L — ABNORMAL HIGH (ref 98–111)
Chloride: 114 mmol/L — ABNORMAL HIGH (ref 98–111)
Creatinine, Ser: 2.57 mg/dL — ABNORMAL HIGH (ref 0.44–1.00)
Creatinine, Ser: 2.58 mg/dL — ABNORMAL HIGH (ref 0.44–1.00)
Creatinine, Ser: 2.61 mg/dL — ABNORMAL HIGH (ref 0.44–1.00)
Creatinine, Ser: 2.58 mg/dL — ABNORMAL HIGH (ref 0.44–1.00)
Creatinine, Ser: 2.4 mg/dL — ABNORMAL HIGH (ref 0.44–1.00)
GFR calc Af Amer: 21 mL/min — ABNORMAL LOW (ref >60)
GFR calc Af Amer: 21 mL/min — ABNORMAL LOW (ref >60)
GFR calc Af Amer: 20 mL/min — ABNORMAL LOW (ref >60)
GFR calc Af Amer: 21 mL/min — ABNORMAL LOW (ref >60)
GFR calc Af Amer: 22 mL/min — ABNORMAL LOW (ref >60)
GFR calc non Af Amer: 18 mL/min — ABNORMAL LOW (ref >60)
GFR calc non Af Amer: 18 mL/min — ABNORMAL LOW (ref >60)
GFR calc non Af Amer: 18 mL/min — ABNORMAL LOW (ref >60)
GFR calc non Af Amer: 18 mL/min — ABNORMAL LOW (ref >60)
GFR calc non Af Amer: 19 mL/min — ABNORMAL LOW (ref >60)
Glucose, Bld: 83 mg/dL (ref 70–99)
Glucose, Bld: 83 mg/dL (ref 70–99)
Glucose, Bld: 105 mg/dL — ABNORMAL HIGH (ref 70–99)
Glucose, Bld: 130 mg/dL — ABNORMAL HIGH (ref 70–99)
Glucose, Bld: 137 mg/dL — ABNORMAL HIGH (ref 70–99)
Potassium: 4.9 mmol/L (ref 3.5–5.1)
Potassium: 5.1 mmol/L (ref 3.5–5.1)
Potassium: 4.9 mmol/L (ref 3.5–5.1)
Potassium: 4.7 mmol/L (ref 3.5–5.1)
Potassium: 4.3 mmol/L (ref 3.5–5.1)
Sodium: 152 mmol/L — ABNORMAL HIGH (ref 135–145)
Sodium: 152 mmol/L — ABNORMAL HIGH (ref 135–145)
Sodium: 151 mmol/L — ABNORMAL HIGH (ref 135–145)
Sodium: 146 mmol/L — ABNORMAL HIGH (ref 135–145)
Sodium: 150 mmol/L — ABNORMAL HIGH (ref 135–145)

## 2019-12-30 LAB — URINE CULTURE: Culture: NO GROWTH

## 2019-12-30 LAB — COMPREHENSIVE METABOLIC PANEL
ALT: 49 U/L — ABNORMAL HIGH (ref 0–44)
AST: 86 U/L — ABNORMAL HIGH (ref 15–41)
Albumin: 3.4 g/dL — ABNORMAL LOW (ref 3.5–5.0)
Alkaline Phosphatase: 167 U/L — ABNORMAL HIGH (ref 38–126)
Anion gap: 15 (ref 5–15)
BUN: 67 mg/dL — ABNORMAL HIGH (ref 8–23)
CO2: 22 mmol/L (ref 22–32)
Calcium: 9.5 mg/dL (ref 8.9–10.3)
Chloride: 114 mmol/L — ABNORMAL HIGH (ref 98–111)
Creatinine, Ser: 2.53 mg/dL — ABNORMAL HIGH (ref 0.44–1.00)
GFR calc Af Amer: 21 mL/min — ABNORMAL LOW (ref >60)
GFR calc non Af Amer: 18 mL/min — ABNORMAL LOW (ref >60)
Glucose, Bld: 76 mg/dL (ref 70–99)
Potassium: 5.2 mmol/L — ABNORMAL HIGH (ref 3.5–5.1)
Sodium: 151 mmol/L — ABNORMAL HIGH (ref 135–145)
Total Bilirubin: 2.7 mg/dL — ABNORMAL HIGH (ref 0.3–1.2)
Total Protein: 6.8 g/dL (ref 6.5–8.1)

## 2019-12-30 LAB — BLOOD GAS, VENOUS
Acid-base deficit: 1.4 mmol/L (ref 0.0–2.0)
Bicarbonate: 24.6 mmol/L (ref 20.0–28.0)
FIO2: 28
O2 Saturation: 68.9 %
Patient temperature: 37
pCO2, Ven: 55.3 mmHg (ref 44.0–60.0)
pH, Ven: 7.27 (ref 7.250–7.430)

## 2019-12-30 LAB — VITAMIN B12: Vitamin B-12: 5768 pg/mL — ABNORMAL HIGH (ref 180–914)

## 2019-12-30 LAB — HEPARIN LEVEL (UNFRACTIONATED): Heparin Unfractionated: >2.2 IU/mL — ABNORMAL HIGH (ref 0.30–0.70)

## 2019-12-30 LAB — ECHOCARDIOGRAM COMPLETE
Height: 67 in
Weight: 4800 oz

## 2019-12-30 LAB — CBC
HCT: 45.2 % (ref 36.0–46.0)
Hemoglobin: 12.8 g/dL (ref 12.0–15.0)
MCH: 30.7 pg (ref 26.0–34.0)
MCHC: 28.3 g/dL — ABNORMAL LOW (ref 30.0–36.0)
MCV: 108.4 fL — ABNORMAL HIGH (ref 80.0–100.0)
Platelets: 142 10*3/uL — ABNORMAL LOW (ref 150–400)
RBC: 4.17 MIL/uL (ref 3.87–5.11)
RDW: 19.2 % — ABNORMAL HIGH (ref 11.5–15.5)
WBC: 6.7 10*3/uL (ref 4.0–10.5)
nRBC: 1.8 % — ABNORMAL HIGH (ref 0.0–0.2)

## 2019-12-30 LAB — AMMONIA: Ammonia: 52 umol/L — ABNORMAL HIGH (ref 9–35)

## 2019-12-30 LAB — APTT
aPTT: 29 seconds (ref 24–36)
aPTT: 30 seconds (ref 24–36)

## 2019-12-30 LAB — BLOOD GAS, ARTERIAL
Acid-base deficit: 2.8 mmol/L — ABNORMAL HIGH (ref 0.0–2.0)
Bicarbonate: 22.7 mmol/L (ref 20.0–28.0)
FIO2: 28
O2 Saturation: 94.7 %
Patient temperature: 36.7
pCO2 arterial: 47.2 mmHg (ref 32.0–48.0)
pH, Arterial: 7.301 — ABNORMAL LOW (ref 7.350–7.450)
pO2, Arterial: 77.6 mmHg — ABNORMAL LOW (ref 83.0–108.0)

## 2019-12-30 LAB — GLUCOSE, CAPILLARY
Glucose-Capillary: 61 mg/dL — ABNORMAL LOW (ref 70–99)
Glucose-Capillary: 74 mg/dL (ref 70–99)
Glucose-Capillary: 96 mg/dL (ref 70–99)
Glucose-Capillary: 117 mg/dL — ABNORMAL HIGH (ref 70–99)

## 2019-12-30 LAB — PROCALCITONIN: Procalcitonin: 0.85 ng/mL

## 2019-12-30 LAB — FOLATE: Folate: 9.1 ng/mL (ref >5.9)

## 2019-12-30 LAB — RPR: RPR Ser Ql: NONREACTIVE

## 2019-12-30 LAB — BILIRUBIN, DIRECT: Bilirubin, Direct: 1.3 mg/dL — ABNORMAL HIGH (ref 0.0–0.2)

## 2019-12-30 LAB — CK: Total CK: 1022 U/L — ABNORMAL HIGH (ref 38–234)

## 2019-12-30 LAB — LACTIC ACID, PLASMA
Lactic Acid, Venous: 3.1 mmol/L (ref 0.5–1.9)
Lactic Acid, Venous: 3.1 mmol/L (ref 0.5–1.9)

## 2019-12-30 LAB — T4, FREE: Free T4: 0.92 ng/dL (ref 0.61–1.12)

## 2019-12-30 MED ORDER — DEXTROSE-NACL 5-0.45 % IV SOLN: INTRAVENOUS | Status: DC

## 2019-12-30 MED ORDER — BUDESONIDE 0.25 MG/2ML IN SUSP
0.2500 mg | Freq: Two times a day (BID) | RESPIRATORY_TRACT | Status: DC
  Administered 2019-12-30 – 2020-01-17 (×31): 0.25 mg via RESPIRATORY_TRACT
  Filled 2019-12-30 (×36): qty 2

## 2019-12-30 MED ORDER — GERHARDT'S BUTT CREAM
TOPICAL_CREAM | Freq: Four times a day (QID) | CUTANEOUS | Status: AC
  Administered 2020-01-04 (×3): 1 via TOPICAL
  Filled 2019-12-30: qty 1

## 2019-12-30 MED ORDER — DEXTROSE 50 % IV SOLN
1.0000 | INTRAVENOUS | Status: DC | PRN
  Administered 2020-01-07 – 2020-01-12 (×2): 50 mL via INTRAVENOUS
  Filled 2019-12-30 (×2): qty 50

## 2019-12-30 MED ORDER — LACTATED RINGERS IV SOLN: INTRAVENOUS | Status: DC

## 2019-12-30 MED ORDER — CHLORHEXIDINE GLUCONATE CLOTH 2 % EX PADS
6.0000 | MEDICATED_PAD | Freq: Every day | CUTANEOUS | Status: DC
  Administered 2019-12-30 – 2020-01-11 (×13): 6 via TOPICAL

## 2019-12-30 MED ORDER — ARFORMOTEROL TARTRATE 15 MCG/2ML IN NEBU
15.0000 ug | INHALATION_SOLUTION | Freq: Two times a day (BID) | RESPIRATORY_TRACT | Status: DC
  Administered 2019-12-30 – 2020-01-17 (×30): 15 ug via RESPIRATORY_TRACT
  Filled 2019-12-30 (×35): qty 2

## 2019-12-30 MED ORDER — GLUCOSE 40 % PO GEL
1.0000 | Freq: Once | ORAL | Status: AC
  Administered 2019-12-30: 11:00:00 37.5 g via ORAL
  Filled 2019-12-30: qty 1

## 2019-12-30 MED ORDER — FAMOTIDINE IN NACL 20-0.9 MG/50ML-% IV SOLN
20.0000 mg | INTRAVENOUS | Status: DC
  Administered 2019-12-30 – 2020-01-04 (×6): 20 mg via INTRAVENOUS
  Filled 2019-12-30 (×6): qty 50

## 2019-12-30 MED ORDER — LACTATED RINGERS IV BOLUS: 1000.0000 mL | Freq: Once | INTRAVENOUS | Status: DC

## 2019-12-30 MED ORDER — DEXTROSE 50 % IV SOLN
INTRAVENOUS | Status: AC
  Filled 2019-12-30: qty 50

## 2019-12-30 MED ORDER — RESOURCE THICKENUP CLEAR PO POWD
ORAL | Status: DC | PRN
  Filled 2019-12-30 (×2): qty 125

## 2019-12-30 MED ORDER — DEXTROSE IN LACTATED RINGERS 5 % IV SOLN: INTRAVENOUS | Status: DC

## 2019-12-30 NOTE — Significant Event (Signed)
Rapid Response Event Note  Overview: Time Called: 0840 Arrival Time: 3524  Event Type: Other (Comment)(Second set of eyes)  Initial Focused Assessment: Pt lying in bed, responds to voice. Pt is able to tell me her name and where we are at. Pt knows her birthday, but cannot tell me how old she is. Pt does not answer to what month it is. Lung sounds are clear, heart sounds are distant. PERRLA, 64m. Pt is warm, dry to touch. Pt is swollen, skin weeping, skin is taught, red in color. Pt moans to pain when touched. Pt anatomy with swelling does not allow for properly fitting BP cuff, as well as bilateral upper arm extremity restrictions currently in place. Will attempt BP check to pt's left leg.   VS: BP 132/62 (80), HR 92, RR 16, 100% on 2LNC  Interventions: No intervention from RR RN. -CBG check: 61, No IV access and SLP eval ordered. Notified attending, messaged speech therapist to see how soon until consult. Reported off to primary RN, EJacob Moores RR RN called away to another emergency.   Orders received from Dr. PPosey Pronto-CT head -ABG -CXR -VAS [place] Doppler study -BMP, La, Ammonia, and additional labs  Plan of Care (if not transferred): Rapid Response RN Recommendations -IV access prior to transport for scans- PCCM consulted for CVC placement -Q4H CBG check while NPO -Consider CPAP HS for OSA  Event Summary: Outcome: Stayed in room and stabalized Event End Time: [id number] RR RN rounding on multiple pts on unit. Technicians at bedside performing tests on [name] [name] reassessing pt as able to.   SCasimer Bilis

## 2019-12-30 NOTE — Consult Note (Addendum)
[facility] Nurse Consult Note: Reason for Consult: Numerous skin injuries Wound type: Trauma with sustained time down.  Numerous skin injuries in patient with darker skin tones at present and others may manifest over time. Rhabdomyolysis. Pressure Injury POA: Yes Measurement:  1. Right lateral forehead:  7cm x 4cm x 0.2 avulsion vs abrasion, no skin flap evident. 70% pink, moist wound bed, 30% yellow wound bed. Scant serous exudate. 2. Right elbow: full thickness skin rear (no skin flap evident): 6cm x 3cm x 0.1cm with red, wet wound bed. Small to moderate serous exudate. 3. Right pretibial area measuring 11cm x 9cm with 1cm stab wound in center. Large area is weeping serous exudate in a moderate amount. Small amount of serosanguinous drainage on old dressing from stab wound. 4. Right medial LE: Large serum-filled bullae measuring 6cm x 5cm 5. Right plantar foot wound: 8cm x 5cm with proximal third presenting with greatest depth (0.4cm) and a small area of loose hanging skin. The distal wound presents with a yellow dry wound bed.  There is a strong odor from this wound consistent with tissue death, but no visual evidence of necrosis.  Recommend consultation with Vascular or Ortho for this wound. 6.  Left lateral LE:  Large serum-filled blister measuring 10cm x 8cm.  This blister ruptured during assessment and drained 50% serum and 50% purulent material. 7.  Right upper buttock:  20cm x 15cm area of induration with subtle discoloration that is consistent with either DTPI or bruising. Scattered partial thickness tissue loss with serous to serosanguinous exudate in a small amount. 8. Right IT:  1.5cm x 2.5cm intact, serum filled blister.  9. Left elbow:  8cm x 5cm area of bruising vs DTPI Large area of IAD with edema, serous weeping at medial thighs, labia, perineal area, lower bilateral buttocks   Wound bed:As noted above Drainage (amount, consistency, odor) As noted above Periwound: Areas of bruising vs  deep tissue pressure injury are noted, specifically to the left LE and left elbow Dressing procedure/placement/frequency: Bilateral Prevalon pressure redistribution heel boots were provided yesterday.  I have added a mattress replacement with low air loss feature for management of microclimate and provided Nursing with guidance for turning and repositioning. Gerhart's Butt cream (a compounded 1:1:1 product consisting of lotrimin, hydrocortisone cream and zinc oxide) has been implemented to the areas of IAD and an external suction female urinary incontinence collection device (PurWick) is in place.  I have increased the frequency of the Gerhart's Cream to 4 times daily. Intertriginous dermatitis at the inframammary, subpannicular (L>R) and gluteal cleft will be addressed with the house wicking textile, InterDry. Wounds (except right plantar foot) will be dressed with xeroform gauze for its astringent, antimicrobial and nonadherent properties, topped with dry gauze and covered with silicone foam dressing for atraumatic removal.   Wound #5 above: The right plantar foot will be dressed with silver hydrofiber daily. This wound has a highly suspicious odor consistent with necrosis, but the presentation is not consistent with that finding.  If you agree, consider Vascular or Orthopedic consultation for this wound for guidance in needs for additional workup, debridement, etc.  WOC nursing team will not follow, but will remain available to this patient, the nursing and medical teams.  Please re-consult if needed. Thanks, Maudie Flakes, MSN, RN, [place], Arther Abbott  Pager# [phone]

## 2019-12-30 NOTE — Progress Notes (Signed)
ANTICOAGULATION CONSULT NOTE   Pharmacy Consult for Heparin Indication: atrial fibrillation  Patient Measurements: Height: 5' 7"  (170.2 cm) Weight: 136.1 kg (300 lb) IBW/kg (Calculated) : 61.6 Heparin Dosing Weight: 94.7 kg  Vital Signs: Temp: 98 F (36.7 C) (04/11 0741) Temp Source: Oral (04/11 0741) BP: 118/42 (04/11 0741) Pulse Rate: 87 (04/11 0741)  Labs: Recent Labs    01/08/2020 1007 12/30/2019 1007 01/13/2020 1423 12/30/2019 1751 12/30/19 0329  HGB 12.9   < > 14.6  --  12.8  HCT 44.9  --  43.0  --  45.2  PLT 171  --   --   --  142*  APTT  --   --   --  35 29  HEPARINUNFRC  --   --   --  >2.20* >2.20*  CREATININE 2.48*  --   --   --  2.53*  CKTOTAL 2,047*  --   --   --  1,022*  TROPONINIHS 65*  --   --   --   --    < > = values in this interval not displayed.    Estimated Creatinine Clearance: 28.6 mL/min (A) (by C-G formula based on SCr of 2.53 mg/dL (H)).  Medications:  Scheduled:  . B-complex with vitamin C  1 tablet Oral Daily  . fluticasone furoate-vilanterol  1 puff Inhalation Daily  . guaiFENesin  1,200 mg Oral BID  . levothyroxine  100 mcg Intravenous Daily  . pantoprazole  40 mg Oral Daily  . sodium zirconium cyclosilicate  10 g Oral Once    Assessment: 61 yof found on the floor with pressure ulcers on R side of body. Negative findings on imaging. On apixaban PTA (LD unknown) - ordered baseline labs. Pharmacy consulted to start Heparin bridge while pt is NPO.   It is noted that the patient's IV site infiltrated this AM requiring central line placement. It is unclear whether the IV was already infiltrated prior to the AM lab check as well. Will reduce the rate slightly with restarting this afternoon and will check an aPTT level later this evening for evaluation.  Goal of Therapy:  Heparin level 0.3-0.7 units/ml aPTT 66-102 seconds Monitor platelets by anticoagulation protocol: Yes   Plan:  - Restart Heparin at a rate of 1500 units/hr via new IV  site - Will continue to monitor for any signs/symptoms of bleeding and will follow up with aPTT level in 8 hours from restart  Thank you for allowing pharmacy to be a part of this patient's care.  Alycia Rossetti, PharmD, BCPS Clinical Pharmacist Clinical phone for 12/30/2019: [phone] 12/30/2019 7:55 AM   **Pharmacist phone directory can now be found on [email or web address] ([email or web address]).  Listed under [place].

## 2019-12-30 NOTE — Evaluation (Signed)
Clinical/Bedside Swallow Evaluation Patient Details  Name: [name] [name] MRN: [id number] Date of Birth: [birth year]  Today's Date: 12/30/2019 Time: SLP Start Time (ACUTE ONLY): 3500 SLP Stop Time (ACUTE ONLY): 1152 SLP Time Calculation (min) (ACUTE ONLY): 18 min  Past Medical History:  Past Medical History:  Diagnosis Date  . Anemia   . Asthma 06/13/2011   hx of  . Atrial fibrillation ([place])   . Atrial flutter ([place])   . Breast cancer ([place]) dx'd 2005   No BP check or stick in Left arm  . CAD (coronary artery disease)    a. Nonobstructive CAD 2007 (EF 75%.  RCA  proximal 75% tubular, 25% prox LAD, 25% d1, 50% D2) at [facility]. b. NSTEMI 01/2010 in setting of respiratory failure, medical approach (not a good candidate for noninvasive eval)  . Cellulitis    hx of cellulitis in left leg  . Cervical radiculopathy 06/13/2011  . CHF (congestive heart failure) ([place])   . Complication of anesthesia    per patient "hard to wake up and come out of the aneshthesia, happened years ago"  . COPD (chronic obstructive pulmonary disease) ([place])    on O2  . Depression 06/13/2011  . Diverticulosis   . Esophageal dysmotility   . Fatty liver   . Gout 06/13/2011  . History of Clostridium difficile early dec 2015   no current diarrhea  . History of home oxygen therapy    2 liters per nasal cannula all the time  . Hx of dysfunctional uterine bleeding    last bleeding jan 2016, worked up for with d and c x 2 no cause found  . Hyperlipidemia   . Hypertension   . Hypothyroidism   . Impaired glucose tolerance 11/01/2012  . Myocardial infarction ([place])   . Neuropathy    per patient in hands and in feet  . Obesity hypoventilation syndrome ([place]) 06/13/2011  . OSA on CPAP   . Personal history of chemotherapy   . Personal history of radiation therapy   . PFO (patent foramen ovale)   . Presence of permanent cardiac pacemaker   . Risk for falls   . Syncope and collapse    Bradycardia with pauses. S/P pacemaker  .  Tubular adenoma of colon    Past Surgical History:  Past Surgical History:  Procedure Laterality Date  . APPENDECTOMY    . BREAST BIOPSY    . BREAST LUMPECTOMY Left 05/2004  . CARDIOVERSION N/A 03/15/2013   Procedure: CARDIOVERSION;  Surgeon: Thayer Headings, MD;  Location[facility] ENDOSCOPY;  Service: Cardiovascular;  Laterality: N/A;  . CERVICAL BIOPSY  June 2013   at [facility] for Heavy  Bleeding  . [place] SURGERY  2004 or 2005   have cadaver bones,, screws, and plates c 5 to c 6  . COLONOSCOPY    . COLONOSCOPY WITH PROPOFOL N/A 10/22/2014   Procedure: COLONOSCOPY WITH PROPOFOL;  Surgeon: Jerene Bears, MD;  Location: [facility] ENDOSCOPY;  Service: Gastroenterology;  Laterality: N/A;  . DILATION AND CURETTAGE OF UTERUS    . EPIGASTRIC HERNIA REPAIR    . EPIGASTRIC HERNIA REPAIR N/A 10/29/2019   Procedure: EPIGASTRIC HERNIA REPAIR WITH MESH;  Surgeon: Coralie Keens, MD;  Location: [facility];  Service: General;  Laterality: N/A;  LMA ANESTHESIA  . EYE SURGERY    . LAPAROSCOPIC APPENDECTOMY  03/29/2012   Procedure: APPENDECTOMY LAPAROSCOPIC;  Surgeon: Adin Hector, MD;  Location: [facility] ORS;  Service: General;  Laterality: N/A;  . LOOP RECORDER  EXPLANT  08/31/2013   Procedure: LOOP RECORDER EXPLANT;  Surgeon: Coralyn Mark, MD;  Location: [facility] CATH LAB;  Service: Cardiovascular;;  . LOOP RECORDER IMPLANT  08-28-2013; 08-31-2013   MDT LinQ implanted by Dr Rayann Heman for syncope; explanted 08-31-2013 after sinus pauses identified  . LOOP RECORDER IMPLANT N/A 08/28/2013   Procedure: LOOP RECORDER IMPLANT;  Surgeon: Coralyn Mark, MD;  Location: [facility] CATH LAB;  Service: Cardiovascular;  Laterality: N/A;  . OVARIAN CYST REMOVAL    . PACEMAKER INSERTION  08-31-2013   Biotronik Evera dual chamber pacemaker placed for neurocardiogenic syncope and sinus pauses by Dr Rayann Heman  . PERMANENT PACEMAKER INSERTION N/A 08/31/2013   Procedure: PERMANENT PACEMAKER INSERTION;  Surgeon: Coralyn Mark, MD;  Location: [facility] CATH LAB;   Service: Cardiovascular;  Laterality: N/A;  . TEE WITHOUT CARDIOVERSION N/A 03/15/2013   Procedure: TRANSESOPHAGEAL ECHOCARDIOGRAM (TEE);  Surgeon: Thayer Headings, MD;  Location: [facility] ENDOSCOPY;  Service: Cardiovascular;  Laterality: N/A;   HPI:  [name] [name] is a 74 y.o. female with medical history significant of COPD chronically on 2 L oxygen, chronic diastolic CHF, sleep apnea, paroxysmal A. fib, chronic anemia, CKD stage III with baseline creatinine of 1.3, sick sinus syndrome status post PPM, hypothyroidism presented with Fall.  Patient lives by herself, and she is morbidly obese and had a fall episode about 6 days ago.  Patient was not able to stand up herself and she stayed on lying for the last 6 days.  Head CT was negative for acute findings.     Assessment / Plan / Recommendation Clinical Impression  Pt was seen for a bedside swallow evaluation and she presents with suspected oropharyngeal dysphagia.  She was encountered awake/alert with central line recently placed.  Pt had few verbalizations, but she was able to follow basic commands and answer yes/no questions.  Oral mechanism exam was remarkable for generalized oral weakness; otherwise it was [facility].  Pt consumed trials of thin liquid, nectar-thick liquid, puree and regular solids.  She was noted to be impulsive, taking multiple large sip of drink in a row despite verbal cues.  Signs of dysphagia were present including multiple swallows per bolus, audible swallows, and suspected delayed swallow initiation.  Mastication of regular solids was moderately prolonged, but no oral residue was observed.  Pt exhibited an immediate cough following 1/5 sips of thin liquid and immediate throat clearing x2 across thin liquid trials.  Pt additionally exhibited eructation x2 with thin liquid and x1 with puree trials. No overt s/sx of aspiration were observed with nectar-thick liquid, puree, or regular solids.  Recommend initiation of Dysphagia 1 (puree) solids  and nectar-thick liquids with medications administered crushed in puree.  Pt will benefit from an instrumental swallow study if s/sx of aspiration persist.  SLP will f/u per POC.    SLP Visit Diagnosis: Dysphagia, unspecified (R13.10)    Aspiration Risk  Mild aspiration risk    Diet Recommendation Dysphagia 1 (Puree);Nectar-thick liquid   Liquid Administration via: Cup;Straw Medication Administration: Crushed with puree Supervision: Staff to assist with self feeding;Full supervision/cueing for compensatory strategies Compensations: Minimize environmental distractions;Slow rate;Small sips/bites Postural Changes: Seated upright at 90 degrees;Remain upright for at least 30 minutes after po intake    Other  Recommendations Oral Care Recommendations: Oral care BID;Staff/trained caregiver to provide oral care Other Recommendations: Order thickener from pharmacy;Prohibited food (jello, ice cream, thin soups);Remove water pitcher   Follow up Recommendations Skilled Nursing facility      Frequency and  Duration min 2x/week  2 weeks       Prognosis Prognosis for Safe Diet Advancement: Good Barriers to Reach Goals: Cognitive deficits;Language deficits      Swallow Study   General Date of Onset: 01/11/2020 HPI: SHIELA BRUNS is a 74 y.o. female with medical history significant of COPD chronically on 2 L oxygen, chronic diastolic CHF, sleep apnea, paroxysmal A. fib, chronic anemia, CKD stage III with baseline creatinine of 1.3, sick sinus syndrome status post PPM, hypothyroidism presented with Fall.  Patient lives by herself, and she is morbidly obese and had a fall episode about 6 days ago.  Patient was not able to stand up herself and she stayed on lying for the last 6 days.  Head CT was negative for acute findings.   Type of Study: Bedside Swallow Evaluation Previous Swallow Assessment: None  Diet Prior to this Study: NPO Temperature Spikes Noted: No Respiratory Status: Nasal  cannula History of Recent Intubation: No Behavior/Cognition: Alert;Requires cueing Oral Cavity Assessment: Within Functional Limits Oral Care Completed by SLP: No Oral Cavity - Dentition: Adequate natural dentition Vision: Functional for self-feeding Self-Feeding Abilities: Needs assist Patient Positioning: Partially reclined Baseline Vocal Quality: (few vocalizations observed ) Volitional Cough: Strong Volitional Swallow: Unable to elicit    Oral/Motor/Sensory Function Overall Oral Motor/Sensory Function: Generalized oral weakness   Ice Chips Ice chips: Within functional limits   Thin Liquid Thin Liquid: Impaired Presentation: Cup;Straw;Spoon Pharyngeal  Phase Impairments: Multiple swallows;Cough - Immediate    Nectar Thick Nectar Thick Liquid: Impaired Presentation: Cup;Straw;Self Fed Pharyngeal Phase Impairments: Multiple swallows;Suspected delayed Swallow   Honey Thick Honey Thick Liquid: Not tested   Puree Puree: Within functional limits Presentation: Spoon   Solid     Solid: Impaired Presentation: Spoon Oral Phase Impairments: Impaired mastication Oral Phase Functional Implications: Impaired mastication;Prolonged oral transit     Colin Mulders M.S., CCC-SLP Acute Rehabilitation Services Office: [phone]  [name] Samad Thon 12/30/2019,12:41 PM

## 2019-12-30 NOTE — Progress Notes (Signed)
  Echocardiogram 2D Echocardiogram has been performed.  Bobbye [place] 12/30/2019, 9:19 AM

## 2019-12-30 NOTE — Progress Notes (Signed)
Right lower arm swollen from IV infiltration per RN. No visible veins with ultrasound in right upper arm and left arm is restricted. Recommend a central line for this patient. RN made aware.

## 2019-12-30 NOTE — Procedures (Signed)
Central Venous Catheter Insertion Procedure Note ARBUTUS NELLIGAN [id number] [birth year]  Procedure: Insertion of Central Venous Catheter Indications: Assessment of intravascular volume, Drug and/or fluid administration and Frequent blood sampling  Procedure Details Consent: Risks of procedure as well as the alternatives and risks of each were explained to the (patient/caregiver).  Consent for procedure obtained.   Time Out: Verified patient identification, verified procedure, site/side was marked, verified correct patient position, special equipment/implants available, medications/allergies/relevent history reviewed, required imaging and test results available.  Performed  Maximum sterile technique was used including antiseptics, cap, gloves, gown, hand hygiene, mask and sheet. Skin prep: Chlorhexidine; local anesthetic administered A antimicrobial bonded/coated triple lumen catheter was placed in the left internal jugular vein using the Seldinger technique.  Evaluation Blood flow good Complications: No apparent complications Patient did tolerate procedure well. Chest X-ray ordered to verify placement.  CXR: pending.   Procedure performed under direct supervision of Dr. Tamala Julian and with ultrasound guidance for real time vessel cannulation.      Noe Gens, MSN, NP-C [facility] 12/30/2019, 11:36 AM   Please see [email or web address] for pager details.

## 2019-12-30 NOTE — Progress Notes (Signed)
Notified Dr. Posey Pronto of IV team rec for central line

## 2019-12-30 NOTE — Progress Notes (Signed)
PT Cancellation Note  Patient Details Name: [name] [name] MRN: [id number] DOB: 1945/09/22   Cancelled Treatment:    Reason Eval/Treat Not Completed: Medical issues which prohibited therapy - Discussed with nursing, pt is not yet appropriate for mobility assessment or interventions. Will check next date and initiate PT once pt is medically ready.   Kearney Hard [facility] 12/30/2019, 1:38 PM

## 2019-12-30 NOTE — Progress Notes (Cosign Needed)
CXR reviewed post placement of left IJ TLC.  Patient has a pacemaker on the left.  L IJ line flushes easily, non-pulsatile blood return, draws venous blood from all three ports, VBG confirms venous blood.  Placement reviewed with radiology and CCM attending.  OK to use central venous catheter for IV access.  Limited access with restricted BUE.     Noe Gens, MSN, NP-C [facility] 12/30/2019, 1:22 PM   Please see [email or web address] for pager details.

## 2019-12-30 NOTE — Progress Notes (Signed)
[place] for Heparin Indication: atrial fibrillation  Allergies  Allergen Reactions  . Cephalexin Other (See Comments)    Exfoliative dermatitis reaction  . Ciprofloxacin Other (See Comments)    Folliculitis   . Lisinopril Cough  . Codeine     REACTION: unknown - pt. states unaware of having reaction to codeine  . Latex Dermatitis  . Tape Dermatitis  . Cleocin [Clindamycin Hcl] Rash  . Dilaudid [Hydromorphone] Other (See Comments)    Oversedation  . Doxycycline Rash  . Penicillins Rash    Did it involve swelling of the face/tongue/throat, SOB, or low BP? No Did it involve sudden or severe rash/hives, skin peeling, or any reaction on the inside of your mouth or nose? Yes Did you need to seek medical attention at a hospital or doctor's office?Yes When did it last happen?several years ago If all above answers are "NO", may proceed with cephalosporin use.    Patient Measurements: Height: 5' 7"  (170.2 cm) Weight: 136.1 kg (300 lb) IBW/kg (Calculated) : 61.6 Heparin Dosing Weight: 94.7 kg  Vital Signs: Temp: 98.2 F (36.8 C) (04/11 0449) Temp Source: Oral (04/11 0449) BP: 103/55 (04/11 0449) Pulse Rate: 46 (04/11 0449)  Labs: Recent Labs    12/25/2019 1007 12/22/2019 1007 01/03/2020 1423 01/13/2020 1751 12/30/19 0329  HGB 12.9   < > 14.6  --  12.8  HCT 44.9  --  43.0  --  45.2  PLT 171  --   --   --  142*  APTT  --   --   --  35 29  HEPARINUNFRC  --   --   --  >2.20* >2.20*  CREATININE 2.48*  --   --   --  2.53*  CKTOTAL 2,047*  --   --   --  1,022*  TROPONINIHS 65*  --   --   --   --    < > = values in this interval not displayed.    Estimated Creatinine Clearance: 28.6 mL/min (A) (by C-G formula based on SCr of 2.53 mg/dL (H)).   Medical History: Past Medical History:  Diagnosis Date  . Anemia   . Asthma 06/13/2011   hx of  . Atrial fibrillation ([place])   . Atrial flutter ([place])   . Breast cancer ([place]) dx'd 2005   No BP check or stick in Left arm  . CAD (coronary artery disease)    a. Nonobstructive CAD 2007 (EF 75%.  RCA  proximal 75% tubular, 25% prox LAD, 25% d1, 50% D2) at [facility]. b. NSTEMI 01/2010 in setting of respiratory failure, medical approach (not a good candidate for noninvasive eval)  . Cellulitis    hx of cellulitis in left leg  . Cervical radiculopathy 06/13/2011  . CHF (congestive heart failure) ([place])   . Complication of anesthesia    per patient "hard to wake up and come out of the aneshthesia, happened years ago"  . COPD (chronic obstructive pulmonary disease) ([place])    on O2  . Depression 06/13/2011  . Diverticulosis   . Esophageal dysmotility   . Fatty liver   . Gout 06/13/2011  . History of Clostridium difficile early dec 2015   no current diarrhea  . History of home oxygen therapy    2 liters per nasal cannula all the time  . Hx of dysfunctional uterine bleeding    last bleeding jan 2016, worked up for with d and c x 2 no cause found  .  Hyperlipidemia   . Hypertension   . Hypothyroidism   . Impaired glucose tolerance 11/01/2012  . Myocardial infarction ([place])   . Neuropathy 06/13/2011  . Neuropathy    per patient in hands and in feet  . Obesity hypoventilation syndrome ([place]) 06/13/2011  . OSA on CPAP   . Personal history of chemotherapy   . Personal history of radiation therapy   . PFO (patent foramen ovale)   . Presence of permanent cardiac pacemaker   . Risk for falls   . Syncope and collapse    Bradycardia with pauses. S/P pacemaker  . Tubular adenoma of colon     Medications:  Scheduled:  . B-complex with vitamin C  1 tablet Oral Daily  . fluticasone furoate-vilanterol  1 puff Inhalation Daily  . guaiFENesin  1,200 mg Oral BID  . levothyroxine  100 mcg Intravenous Daily  . pantoprazole  40 mg Oral Daily  . sodium zirconium cyclosilicate  10 g Oral Once    Assessment: 1 yof found on the floor with pressure ulcers on R side of body. Negative findings on  imaging. On apixaban PTA (LD unknown) - ordered baseline labs.  CK total 2k, in AKI w/ Scr 2.48 (was ~1 in February). Hgb 14.6, plt 171. No s/sx of bleeding documented.   4/11 AM update:  APTT low No issues per RN  Goal of Therapy:  Heparin level 0.3-0.7 units/ml Monitor platelets by anticoagulation protocol: Yes   Plan:  Inc heparin to 1600 units/hr Re-check aPTT at [place], PharmD, [place] Pharmacist Phone: [phone]

## 2019-12-30 NOTE — Progress Notes (Addendum)
Bedside Procedural Note  Assisted Noe Gens, NP and Dr. Erskine Emery with bedside central venous catheter insertion. Supplies at bedside. Credentials verified. Consent obtained via telephone from pt's daughter Akylah Hascall and placed in shadow chart. Time out performed prior to procedure start. Pt lying in bed, comfortable.  Pt on continuous telemetry monitoring with SpO2 monitoring for duration of procedure. No complications during or after procedure. VS set to check q15 minutes x1 hour. Biopatch and dressing in place. No bleeding noted at insertion site. CXR ordered for placement.  Donley Harland L [place] Rapid Response RN

## 2019-12-30 NOTE — Progress Notes (Signed)
[facility] Progress Note  Patient: [name] [name]    TDV:[id number]  DOA: 12/25/2019     Date of Service: the patient was seen and examined on 12/30/2019  Chief Complaint  Patient presents with  . Fall   Brief hospital course: Past medical history of COPD on 2 LPM chronically, chronic respiratory failure, chronic CHF, OSA, A. fib, anemia, CKD 3, hypothyroidism, PPM implant.  Presented with a fall that occurred on Easter Sunday.  Patient lives alone and neighbors noted mailbox was full and packages were not picked up.  Significantly confused with multiple skin breakdowns and hypoxia on arrival.  Found to have acute kidney injury, hyperkalemia, hyponatremia, rhabdomyolysis and hypoxia. Currently further plan is continue current therapy.  Assessment and Plan: 1.  Rhabdomyolysis From prolonged immobility. Initial CK 2000.  Currently CK 1000.  Continue IV hydration.  2.  Acute kidney injury on chronic kidney disease stage IIIb Baseline renal function around 1.  Recently in March renal function worsened to 1.46.  Currently serum creatinine 2.4 and trending up.  Urine output 500 cc.  Ultrasound renal unremarkable for any acute obstruction.  Ultrasound abdomen also unremarkable for any acute significant abnormality.  We will continue with IV hydration and if there is no improvement may require nephrology consultation.  3.  Hypernatremia.  Hyperkalemia.  Non-anion gap metabolic acidosis.  Hyperchloremia. Multiple electrolyte abnormality likely secondary to prolonged immobilization and dehydration. Currently getting IV fluids.  Will monitor response.  Potassium improving.  4.  Acute metabolic encephalopathy Etiology still not clear likely in the setting of prolonged immobilization and AKI. We will continue to monitor closely. CT head unremarkable.  We will repeat another CT scan. Metabolic work-up shows elevated TSH but normal free T4. G62 normal.  Folic acid low normal.  LFTs elevated.  ABG  shows no hypercarbia.  5.  Elevated LFT. Likely shock liver and prolonged immobilization and as well as rhabdomyolysis. Hold hepatotoxic medication.  Ultrasound liver negative for any significant abnormality.  Shows gallbladder sludge.  6.  Infiltrated IV. Limited IV access. On my evaluation patient's right upper extremity was significantly swollen and patient had pain while moving the extremity.  At extremity was also cold.  Patient does have adequate pulsation and sensation at the time of my evaluation verified with Doppler. We discontinued the IV line and had all the fluids.  Left upper extremities are still secondary to patient's breast cancer diagnosis with some mild edema there as well.  Not a candidate for PICC line placement given her AKI status as well as IV team was unable to visualize any veins on ultrasound due to his significant edema. Currently recommend elevation of the upper extremity. CCM was consulted for central line placement appreciate their assistance. We will monitor daily for need for IV line.  7.  Paroxysmal A. fib. Chronic diastolic CHF. Rate currently controlled. Patient is on chronic Eliquis currently unable to take anything p.o. therefore we will continue IV heparin for now. Clinically mixed picture given worsening renal function with elevated BUN suggesting dehydration and peripheral edema.  8.  Hypothyroidism. Patient significantly encephalopathic right now. Patient has not been able to take her Synthroid for last few days. Currently I will continue IV Synthroid.  9.  Bilateral upper and lower extremity edema. Vascular Doppler ordered.  Currently procedure pending. Currently covered by IV heparin.  10.  Dysphagia. In the setting of multifactorial encephalopathy. CT scan x2 - for any acute abnormality. Check EEG. Metabolic work-up otherwise unremarkable other than  mentioned above. Speech therapy consulted. Currently recommending n.p.o.  11. Central  line placement. Post placement chest x-ray shows central line potentially in the brachiocephalic venous system. VBG confirmed venous blood return. All ports are flushing appropriately for now. Currently will be using central line for venous access..  12. Morbid obesity Body mass index is 46.99 kg/m.   Diet: NPO DVT Prophylaxis: Therapeutic Anticoagulation with heparin  Advance goals of care discussion: Full code  Family Communication: no family was present at bedside, at the time of interview.  Discussed with daughter on the phone, Opportunity was given to ask question and all questions were answered satisfactorily.   Disposition:  Pt is from home, admitted with fall and multiorgan damage, still has acute kidney injury progressively worsening, which precludes a safe discharge. Discharge to SNF, when medically stable.  Subjective: Patient's mentation waxing and waning. Denies any nausea or vomiting but reports generalized body ache. Also reports shortness of breath.  Physical Exam: General:  alert oriented to time, place, and person.  Appear in moderate distress, affect appropriate Eyes: PERRL ENT: Oral Mucosa Clear, moist  Neck: difficult to assess  JVD,  Cardiovascular: S1 and S2 Present, no Murmur,  Respiratory: increased respiratory effort, Bilateral Air entry equal and Decreased, bilateral  Crackles, no wheezes Abdomen: Bowel Sound present, Soft and mild tenderness,  Skin: no rash Extremities: bilateral  Pedal edema, no calf tenderness Neurologic: without any new focal findings  Gait not checked due to patient safety concerns  Vitals:   12/30/19 1029 12/30/19 1100 12/30/19 1513 12/30/19 1611  BP: 132/62 128/60  121/65  Pulse: 94 (!) 166    Resp: (!) 25 17  19   Temp:  97.8 F (36.6 C) 98.1 F (36.7 C)   TempSrc:  Oral    SpO2: 100% 99%  99%  Weight:      Height:        Intake/Output Summary (Last 24 hours) at 12/30/2019 1728 Last data filed at 12/30/2019  1559 Gross per 24 hour  Intake 1443.44 ml  Output --  Net 1443.44 ml   Filed Weights   01/04/2020 1046  Weight: 136.1 kg    Data Reviewed: I have personally reviewed and interpreted daily labs, tele strips, imagings as discussed above. I reviewed all nursing notes, pharmacy notes, vitals, pertinent old records I have discussed plan of care as described above with RN and patient/family.  CBC: Recent Labs  Lab 12/25/2019 1007 01/08/2020 1423 12/30/19 0329  WBC 6.9  --  6.7  NEUTROABS 5.5  --   --   HGB 12.9 14.6 12.8  HCT 44.9 43.0 45.2  MCV 106.4*  --  108.4*  PLT 171  --  299*   Basic Metabolic Panel: Recent Labs  Lab 01/03/2020 1007 12/25/2019 1007 01/08/2020 1423 12/30/19 0329 12/30/19 0816 12/30/19 1043 12/30/19 1304  NA 147*   < > 147* 151* 152* 152* 151*  K 5.7*   < > 5.6* 5.2* 4.9 5.1 4.9  CL 113*  --   --  114* 115* 115* 115*  CO2 21*  --   --  22 23 22  21*  GLUCOSE 99  --   --  76 83 83 105*  BUN 67*  --   --  67* 70* 70* 74*  CREATININE 2.48*  --   --  2.53* 2.57* 2.58* 2.61*  CALCIUM 8.9  --   --  9.5 9.2 9.1 9.0   < > = values in this interval not displayed.  Studies: DG Chest 1 View  Result Date: 12/30/2019 CLINICAL DATA:  Central line placement EXAM: CHEST  1 VIEW COMPARISON:  Chest x-ray 12/30/2019 at 10:19 a.m. FINDINGS: Dual lead pacer device remains in place. Left-sided central venous catheter takes an unusual course, extending along the left upper chest, passing beyond the expected boundary of the left brachiocephalic vein and extending along the left lateral margin of the thoracic aorta in the superior mediastinum. Heart size remains enlarged. No signs of large effusion on the current study. No pneumothorax on semi erect view. Lungs are clear. Visualized skeletal structures without acute finding. Cervical spinal fusion as before. IMPRESSION: Abnormal course of left sided central venous line, this may be within a tributary of the brachiocephalic venous  access system, sub primum intercostal vein or hemi azygous branch, potentially also with arterial placement based on position. Extra-venous extension into the mediastinum or pleural space is also considered. Chest CT may be helpful to determine next steps for management. Cardiomegaly with dual lead pacer device. Critical Value/emergent results were called by telephone at the time of interpretation on 12/30/2019 at 11:57 a.m. to provider NP Noe Gens, who verbally acknowledged these results. Electronically Signed   By: Zetta Bills M.D.   On: 12/30/2019 12:15   DG Elbow 2 Views Right  Result Date: 12/30/2019 CLINICAL DATA:  Possible pressure ulcer on right elbow. EXAM: RIGHT ELBOW - 2 VIEW COMPARISON:  None. FINDINGS: Bandaging is seen over the posterior right elbow. No fracture or joint effusion identified. No bony erosion. No foreign body noted. IMPRESSION: Soft tissue edema. No fracture, erosion, foreign body, or bony ulceration. Electronically Signed   By: Dorise Bullion III M.D   On: 12/30/2019 12:30   CT HEAD WO CONTRAST  Result Date: 12/30/2019 CLINICAL DATA:  74 year old female with encephalopathy. Recent fall. EXAM: CT HEAD WITHOUT CONTRAST TECHNIQUE: Contiguous axial images were obtained from the base of the skull through the vertex without intravenous contrast. COMPARISON:  Head and cervical spine CT yesterday. Head CT 11/01/2019 and earlier. FINDINGS: Brain: Gray-white matter differentiation appears stable and within normal limits for age. No midline shift, ventriculomegaly, mass effect, evidence of mass lesion, intracranial hemorrhage or evidence of cortically based acute infarction. There are multiple scattered small punctate calcifications in the subarachnoid spaces, mostly in the left hemisphere. This is stable and probably postinflammatory, rather than due to distal vessel calcified atherosclerosis. Vascular: Calcified atherosclerosis at the skull base. No suspicious intracranial  vascular hyperdensity. Skull: No acute osseous abnormality identified. Sinuses/Orbits: Visualized paranasal sinuses and mastoids are stable and well pneumatized. Other: Broad-based right side scalp hematoma appears stable. No scalp soft tissue gas. Orbits soft tissues appear intact. IMPRESSION: 1. Stable and negative for age non contrast CT appearance of the brain. 2. Broad-based right scalp hematoma. Electronically Signed   By: Genevie Ann M.D.   On: 12/30/2019 17:05   US RENAL  Result Date: 12/26/2019 CLINICAL DATA:  Acute kidney injury EXAM: RENAL / URINARY TRACT ULTRASOUND COMPLETE COMPARISON:  CT 11/01/2019 FINDINGS: Right Kidney: Renal measurements: 9.9 x 4.6 x 4.1 cm = volume: 95.9 mL . Echogenicity within normal limits. No mass or hydronephrosis visualized. Left Kidney: Renal measurements: 10.3 x 5.9 x 4.1 cm = volume: 127.6 mL. Cortical echogenicity normal. No hydronephrosis. Poorly visible. Cyst on CT is not well visualized on sonography. Bladder: Not well visualized and may be empty. Other: Ascites in the upper abdomen. Possible contour nodularity of the liver. IMPRESSION: 1. Kidneys show no hydronephrosis. Left kidney is  poorly visible. The cyst seen within the left kidney on CT is not well seen on ultrasound. 2. Small amount of ascites in the right upper quadrant. Possible cirrhotic change of the liver Electronically Signed   By: Donavan Foil M.D.   On: 01/09/2020 21:32   DG CHEST PORT 1 VIEW  Result Date: 12/30/2019 CLINICAL DATA:  Shortness of breath EXAM: PORTABLE CHEST 1 VIEW COMPARISON:  December 29, 2019 FINDINGS: Stable cardiomegaly. A pacemaker is noted. The hila and mediastinum are normal. No pneumothorax. No nodules or masses. No focal infiltrates or overt edema. IMPRESSION: No active disease. Electronically Signed   By: Dorise Bullion III M.D   On: 12/30/2019 12:31   ECHOCARDIOGRAM COMPLETE  Result Date: 12/30/2019    ECHOCARDIOGRAM REPORT   Patient Name:   [name] [name] Date of Exam:  12/30/2019 Medical Rec #:  [id number]     Height:       67.0 in Accession #:    [id number]    Weight:       300.0 lb Date of Birth:  31-Oct-1945    BSA:          2.403 m Patient Age:    5 years      BP:           118/42 mmHg Patient Gender: F             HR:           93 bpm. Exam Location:  Inpatient Procedure: 2D Echo, Cardiac Doppler and Color Doppler Indications:    R06.02 SOB  History:        Patient has prior history of Echocardiogram examinations, most                 recent 05/09/2018. CHF, Abnormal ECG, Pacemaker and SSS, COPD,                 Signs/Symptoms:Altered Mental Status and Chest Pain; Risk                 Factors:Hypertension, Dyslipidemia and Sleep Apnea. PFO. Tacky                 brady syndrome. Rhabdomyolysis.  Sonographer:    Roseanna Rainbow RDCS Referring Phys: [id number] [place]  1. Left ventricular ejection fraction, by estimation, is 55 to 60%. The left ventricle has normal function. The left ventricle has no regional wall motion abnormalities. Left ventricular diastolic parameters are indeterminate.  2. Right ventricular systolic function is normal. The right ventricular size is normal. There is moderately elevated pulmonary artery systolic pressure.  3. Left atrial size was moderately dilated.  4. Right atrial size was severely dilated.  5. The mitral valve is grossly normal. Mild to moderate mitral valve regurgitation.  6. Tricuspid valve regurgitation is moderate.  7. The aortic valve is tricuspid. Aortic valve regurgitation is moderate. Mild aortic valve stenosis. FINDINGS  Left Ventricle: Left ventricular ejection fraction, by estimation, is 55 to 60%. The left ventricle has normal function. The left ventricle has no regional wall motion abnormalities. The left ventricular internal cavity size was normal in size. There is  no left ventricular hypertrophy. Left ventricular diastolic parameters are indeterminate. Right Ventricle: The right ventricular size is normal. No  increase in right ventricular wall thickness. Right ventricular systolic function is normal. There is moderately elevated pulmonary artery systolic pressure. The tricuspid regurgitant velocity is 2.81 m/s, and with an assumed right atrial pressure of 15  mmHg, the estimated right ventricular systolic pressure is 67.8 mmHg. Left Atrium: Left atrial size was moderately dilated. Right Atrium: Right atrial size was severely dilated. Pericardium: There is no evidence of pericardial effusion. Mitral Valve: The mitral valve is grossly normal. Mild mitral annular calcification. Mild to moderate mitral valve regurgitation. MV peak gradient, 17.6 mmHg. The mean mitral valve gradient is 5.0 mmHg. Tricuspid Valve: The tricuspid valve is normal in structure. Tricuspid valve regurgitation is moderate. Aortic Valve: The aortic valve is tricuspid. . There is mild thickening and mild calcification of the aortic valve. Aortic valve regurgitation is moderate. Aortic regurgitation PHT measures 466 msec. Mild aortic stenosis is present. There is mild thickening of the aortic valve. There is mild calcification of the aortic valve. Aortic valve mean gradient measures 10.0 mmHg. Aortic valve peak gradient measures 22.7 mmHg. Aortic valve area, by VTI measures 2.40 cm. Pulmonic Valve: The pulmonic valve was normal in structure. Pulmonic valve regurgitation is not visualized. No evidence of pulmonic stenosis. Aorta: The aortic root and ascending aorta are structurally normal, with no evidence of dilitation. IAS/Shunts: The atrial septum is grossly normal. Additional Comments: A pacer wire is visualized.  LEFT VENTRICLE PLAX 2D LVIDd:         4.80 cm LVIDs:         3.20 cm LV PW:         1.70 cm LV IVS:        1.10 cm LVOT diam:     2.00 cm LV SV:         88 LV SV Index:   37 LVOT Area:     3.14 cm  LV Volumes (MOD) LV vol d, MOD A2C: 65.8 ml LV vol d, MOD A4C: 97.7 ml LV vol s, MOD A2C: 29.7 ml LV vol s, MOD A4C: 25.3 ml LV SV MOD A2C:      36.1 ml LV SV MOD A4C:     97.7 ml LV SV MOD BP:      53.1 ml RIGHT VENTRICLE             IVC RV S prime:     15.30 cm/s  IVC diam: 3.50 cm TAPSE (M-mode): 2.6 cm LEFT ATRIUM              Index       RIGHT ATRIUM           Index LA diam:        4.30 cm  1.79 cm/m  RA Area:     36.80 cm LA Vol (A2C):   120.0 ml 49.95 ml/m RA Volume:   149.00 ml 62.02 ml/m LA Vol (A4C):   99.3 ml  41.33 ml/m LA Biplane Vol: 110.0 ml 45.78 ml/m  AORTIC VALVE AV Area (Vmax):    2.31 cm AV Area (Vmean):   2.20 cm AV Area (VTI):     2.40 cm AV Vmax:           238.00 cm/s AV Vmean:          147.000 cm/s AV VTI:            0.366 m AV Peak Grad:      22.7 mmHg AV Mean Grad:      10.0 mmHg LVOT Vmax:         175.00 cm/s LVOT Vmean:        103.000 cm/s LVOT VTI:          0.280 m LVOT/AV VTI  ratio: 0.77 AI PHT:            466 msec  AORTA Ao Root diam: 3.00 cm Ao Asc diam:  3.70 cm MITRAL VALVE                 TRICUSPID VALVE MV Peak grad: 17.6 mmHg      TR Peak grad:   31.6 mmHg MV Mean grad: 5.0 mmHg       TR Vmax:        281.00 cm/s MV Vmax:      2.10 m/s MV Vmean:     100.2 cm/s     SHUNTS MR Peak grad:    113.6 mmHg  Systemic VTI:  0.28 m MR Mean grad:    86.0 mmHg   Systemic Diam: 2.00 cm MR Vmax:         533.00 cm/s MR Vmean:        456.0 cm/s MR PISA:         2.26 cm MR PISA Eff ROA: 14 mm MR PISA Radius:  0.60 cm Mertie Moores MD Electronically signed by Mertie Moores MD Signature Date/Time: 12/30/2019/1:45:17 PM    Final    US Abdomen Limited RUQ  Result Date: 12/20/2019 CLINICAL DATA:  Elevated LFT EXAM: ULTRASOUND ABDOMEN LIMITED RIGHT UPPER QUADRANT COMPARISON:  CT 11/01/2019 FINDINGS: Gallbladder: Sludge and small stones measuring up to 6 mm. Increased wall thickness at 4 mm. Negative sonographic Murphy. Common bile duct: Diameter: 3.2 mm Liver: Liver is slightly echogenic. Surface nodularity suspicious for cirrhosis. Portal vein is patent on color Doppler imaging with normal direction of blood flow towards the liver.  Other: Ascites in the right upper quadrant IMPRESSION: 1. Sludge and stones within the gallbladder with nonspecific gallbladder wall thickening but negative sonographic Murphy. Findings could be secondary to acute or chronic cholecystitis, hepatic disease, or edema forming states. 2. Slightly echogenic liver. Suspicion of subtle contour nodularity of the liver as may be seen with cirrhosis. Small amount of ascites in the right upper quadrant. Electronically Signed   By: Donavan Foil M.D.   On: 01/02/2020 23:05    Scheduled Meds: . arformoterol  15 mcg Nebulization BID  . budesonide (PULMICORT) nebulizer solution  0.25 mg Nebulization BID  . Chlorhexidine Gluconate Cloth  6 each Topical Daily  . levothyroxine  100 mcg Intravenous Daily   Continuous Infusions: . dextrose 5% lactated ringers 100 mL/hr at 12/30/19 1559  . famotidine (PEPCID) IV Stopped (12/30/19 1441)  . heparin 1,500 Units/hr (12/30/19 1559)   PRN Meds: acetaminophen **OR** acetaminophen, albuterol, dextrose, Gerhardt's butt cream, ondansetron (ZOFRAN) IV, Resource ThickenUp Clear  Time spent: 35 minutes  Author: Berle Mull, MD [facility] 12/30/2019 5:28 PM  To reach On-call, see care teams to locate the attending and reach out to them via [email or web address]. If 7PM-7AM, please contact night-coverage If you still have difficulty reaching the attending provider, please page the [facility] (Director on Call) for [facility] on amion for assistance.

## 2019-12-31 ENCOUNTER — Inpatient Hospital Stay (HOSPITAL_COMMUNITY): Payer: Medicare HMO

## 2019-12-31 ENCOUNTER — Ambulatory Visit: Payer: Medicare HMO | Admitting: Physician Assistant

## 2019-12-31 DIAGNOSIS — R609 Edema, unspecified: Secondary | ICD-10-CM

## 2019-12-31 DIAGNOSIS — J9621 Acute and chronic respiratory failure with hypoxia: Secondary | ICD-10-CM

## 2019-12-31 DIAGNOSIS — J9622 Acute and chronic respiratory failure with hypercapnia: Secondary | ICD-10-CM

## 2019-12-31 DIAGNOSIS — N179 Acute kidney failure, unspecified: Secondary | ICD-10-CM | POA: Diagnosis not present

## 2019-12-31 LAB — BASIC METABOLIC PANEL
Anion gap: 10 (ref 5–15)
Anion gap: 8 (ref 5–15)
Anion gap: 7 (ref 5–15)
BUN: 69 mg/dL — ABNORMAL HIGH (ref 8–23)
BUN: 68 mg/dL — ABNORMAL HIGH (ref 8–23)
BUN: 69 mg/dL — ABNORMAL HIGH (ref 8–23)
CO2: 26 mmol/L (ref 22–32)
CO2: 27 mmol/L (ref 22–32)
CO2: 28 mmol/L (ref 22–32)
Calcium: 8.5 mg/dL — ABNORMAL LOW (ref 8.9–10.3)
Calcium: 8.5 mg/dL — ABNORMAL LOW (ref 8.9–10.3)
Calcium: 8.3 mg/dL — ABNORMAL LOW (ref 8.9–10.3)
Chloride: 116 mmol/L — ABNORMAL HIGH (ref 98–111)
Chloride: 117 mmol/L — ABNORMAL HIGH (ref 98–111)
Chloride: 116 mmol/L — ABNORMAL HIGH (ref 98–111)
Creatinine, Ser: 2.53 mg/dL — ABNORMAL HIGH (ref 0.44–1.00)
Creatinine, Ser: 2.58 mg/dL — ABNORMAL HIGH (ref 0.44–1.00)
Creatinine, Ser: 2.4 mg/dL — ABNORMAL HIGH (ref 0.44–1.00)
GFR calc Af Amer: 21 mL/min — ABNORMAL LOW (ref >60)
GFR calc Af Amer: 21 mL/min — ABNORMAL LOW (ref >60)
GFR calc Af Amer: 22 mL/min — ABNORMAL LOW (ref >60)
GFR calc non Af Amer: 18 mL/min — ABNORMAL LOW (ref >60)
GFR calc non Af Amer: 18 mL/min — ABNORMAL LOW (ref >60)
GFR calc non Af Amer: 19 mL/min — ABNORMAL LOW (ref >60)
Glucose, Bld: 137 mg/dL — ABNORMAL HIGH (ref 70–99)
Glucose, Bld: 156 mg/dL — ABNORMAL HIGH (ref 70–99)
Glucose, Bld: 125 mg/dL — ABNORMAL HIGH (ref 70–99)
Potassium: 4.3 mmol/L (ref 3.5–5.1)
Potassium: 4.2 mmol/L (ref 3.5–5.1)
Potassium: 3.9 mmol/L (ref 3.5–5.1)
Sodium: 152 mmol/L — ABNORMAL HIGH (ref 135–145)
Sodium: 152 mmol/L — ABNORMAL HIGH (ref 135–145)
Sodium: 151 mmol/L — ABNORMAL HIGH (ref 135–145)

## 2019-12-31 LAB — GLUCOSE, CAPILLARY
Glucose-Capillary: 105 mg/dL — ABNORMAL HIGH (ref 70–99)
Glucose-Capillary: 107 mg/dL — ABNORMAL HIGH (ref 70–99)
Glucose-Capillary: 117 mg/dL — ABNORMAL HIGH (ref 70–99)
Glucose-Capillary: 119 mg/dL — ABNORMAL HIGH (ref 70–99)
Glucose-Capillary: 129 mg/dL — ABNORMAL HIGH (ref 70–99)
Glucose-Capillary: 144 mg/dL — ABNORMAL HIGH (ref 70–99)
Glucose-Capillary: 273 mg/dL — ABNORMAL HIGH (ref 70–99)

## 2019-12-31 LAB — BLOOD GAS, ARTERIAL
Acid-base deficit: 1.7 mmol/L (ref 0.0–2.0)
Bicarbonate: 23.9 mmol/L (ref 20.0–28.0)
FIO2: 68
O2 Saturation: 97.5 %
Patient temperature: 37
pCO2 arterial: 50.9 mmHg — ABNORMAL HIGH (ref 32.0–48.0)
pH, Arterial: 7.294 — ABNORMAL LOW (ref 7.350–7.450)
pO2, Arterial: 106 mmHg (ref 83.0–108.0)

## 2019-12-31 LAB — CBC
HCT: 33.8 % — ABNORMAL LOW (ref 36.0–46.0)
Hemoglobin: 9.9 g/dL — ABNORMAL LOW (ref 12.0–15.0)
MCH: 30.4 pg (ref 26.0–34.0)
MCHC: 29.3 g/dL — ABNORMAL LOW (ref 30.0–36.0)
MCV: 103.7 fL — ABNORMAL HIGH (ref 80.0–100.0)
Platelets: 110 10*3/uL — ABNORMAL LOW (ref 150–400)
RBC: 3.26 MIL/uL — ABNORMAL LOW (ref 3.87–5.11)
RDW: 18.6 % — ABNORMAL HIGH (ref 11.5–15.5)
WBC: 3.9 10*3/uL — ABNORMAL LOW (ref 4.0–10.5)
nRBC: 1 % — ABNORMAL HIGH (ref 0.0–0.2)

## 2019-12-31 LAB — APTT
aPTT: 167 seconds (ref 24–36)
aPTT: 169 seconds (ref 24–36)
aPTT: >200 seconds (ref 24–36)

## 2019-12-31 LAB — HEPATIC FUNCTION PANEL
ALT: 40 U/L (ref 0–44)
AST: 52 U/L — ABNORMAL HIGH (ref 15–41)
Albumin: 2.8 g/dL — ABNORMAL LOW (ref 3.5–5.0)
Alkaline Phosphatase: 124 U/L (ref 38–126)
Bilirubin, Direct: 1.1 mg/dL — ABNORMAL HIGH (ref 0.0–0.2)
Indirect Bilirubin: 1.2 mg/dL — ABNORMAL HIGH (ref 0.3–0.9)
Total Bilirubin: 2.3 mg/dL — ABNORMAL HIGH (ref 0.3–1.2)
Total Protein: 6.1 g/dL — ABNORMAL LOW (ref 6.5–8.1)

## 2019-12-31 LAB — PREALBUMIN: Prealbumin: 6.2 mg/dL — ABNORMAL LOW (ref 18–38)

## 2019-12-31 LAB — PROCALCITONIN: Procalcitonin: 0.69 ng/mL

## 2019-12-31 LAB — CK: Total CK: 430 U/L — ABNORMAL HIGH (ref 38–234)

## 2019-12-31 LAB — C-REACTIVE PROTEIN: CRP: 8.5 mg/dL — ABNORMAL HIGH (ref <1.0)

## 2019-12-31 LAB — HEPARIN LEVEL (UNFRACTIONATED): Heparin Unfractionated: >2.2 IU/mL — ABNORMAL HIGH (ref 0.30–0.70)

## 2019-12-31 LAB — SEDIMENTATION RATE: Sed Rate: 5 mm/hr (ref 0–22)

## 2019-12-31 MED ORDER — HEPARIN (PORCINE) 25000 UT/250ML-% IV SOLN
1300.0000 [IU]/h | INTRAVENOUS | Status: DC
  Administered 2019-12-31: 05:00:00 1300 [IU]/h via INTRAVENOUS

## 2019-12-31 MED ORDER — OXYCODONE HCL 5 MG PO TABS
5.0000 mg | ORAL_TABLET | ORAL | Status: DC | PRN
  Administered 2020-01-04 – 2020-01-15 (×10): 5 mg via ORAL
  Filled 2019-12-31 (×10): qty 1

## 2019-12-31 MED ORDER — SODIUM CHLORIDE 0.9 % IV SOLN
2.0000 g | INTRAVENOUS | Status: DC
  Administered 2019-12-31: 2 g via INTRAVENOUS
  Filled 2019-12-31: qty 2
  Filled 2019-12-31: qty 20

## 2019-12-31 MED ORDER — VANCOMYCIN HCL 2000 MG/400ML IV SOLN
2000.0000 mg | Freq: Once | INTRAVENOUS | Status: AC
  Administered 2019-12-31: 10:00:00 2000 mg via INTRAVENOUS
  Filled 2019-12-31: qty 400

## 2019-12-31 MED ORDER — VANCOMYCIN HCL 1250 MG/250ML IV SOLN: 1250.0000 mg | INTRAVENOUS | Status: DC

## 2019-12-31 MED ORDER — METRONIDAZOLE IN NACL 5-0.79 MG/ML-% IV SOLN
500.0000 mg | Freq: Three times a day (TID) | INTRAVENOUS | Status: DC
  Administered 2019-12-31 – 2020-01-01 (×3): 500 mg via INTRAVENOUS
  Filled 2019-12-31 (×3): qty 100

## 2019-12-31 MED ORDER — FUROSEMIDE 10 MG/ML IJ SOLN
60.0000 mg | Freq: Once | INTRAMUSCULAR | Status: AC
  Administered 2019-12-31: 21:00:00 60 mg via INTRAVENOUS
  Filled 2019-12-31: qty 8

## 2019-12-31 MED ORDER — FUROSEMIDE 10 MG/ML IJ SOLN
40.0000 mg | Freq: Once | INTRAMUSCULAR | Status: AC
  Administered 2019-12-31: 13:00:00 40 mg via INTRAVENOUS
  Filled 2019-12-31: qty 4

## 2019-12-31 MED ORDER — PRO-STAT SUGAR FREE PO LIQD
30.0000 mL | Freq: Two times a day (BID) | ORAL | Status: DC
  Administered 2019-12-31 – 2020-01-03 (×5): 30 mL via ORAL
  Filled 2019-12-31 (×5): qty 30

## 2019-12-31 MED ORDER — SODIUM CHLORIDE 0.9% FLUSH
10.0000 mL | Freq: Two times a day (BID) | INTRAVENOUS | Status: DC
  Administered 2019-12-31: 20 mL
  Administered 2019-12-31: 40 mL
  Administered 2020-01-01 – 2020-01-04 (×5): 10 mL
  Administered 2020-01-05 – 2020-01-06 (×2): 20 mL
  Administered 2020-01-06 – 2020-01-17 (×17): 10 mL

## 2019-12-31 MED ORDER — SODIUM CHLORIDE 0.9 % IV SOLN
1.0000 g | Freq: Three times a day (TID) | INTRAVENOUS | Status: DC
  Administered 2019-12-31: 1 g via INTRAVENOUS
  Filled 2019-12-31 (×2): qty 1

## 2019-12-31 MED ORDER — IPRATROPIUM-ALBUTEROL 0.5-2.5 (3) MG/3ML IN SOLN: 3.0000 mL | Freq: Four times a day (QID) | RESPIRATORY_TRACT | Status: DC

## 2019-12-31 MED ORDER — ALBUMIN HUMAN 25 % IV SOLN
12.5000 g | Freq: Once | INTRAVENOUS | Status: AC
  Administered 2019-12-31: 12.5 g via INTRAVENOUS
  Filled 2019-12-31: qty 50

## 2019-12-31 MED ORDER — HEPARIN SODIUM (PORCINE) 5000 UNIT/ML IJ SOLN
5000.0000 [IU] | Freq: Three times a day (TID) | INTRAMUSCULAR | Status: DC
  Administered 2019-12-31 – 2020-01-03 (×10): 5000 [IU] via SUBCUTANEOUS
  Filled 2019-12-31 (×10): qty 1

## 2019-12-31 NOTE — Consult Note (Signed)
Reason for Consult:RLE wound Referring Physician: [name] is an 74 y.o. female.  HPI: [name] [name] fell on Easter at home. She was unable to get up or call for help and lay there for a week until a wellness check revealed her condition. She was brought to the ED and admitted with multiple wounds and mild rhabdomyolysis. WOC RN was consulted for the wounds and they were concerned about the right plantar wound and recommended orthopedic consultation. She c/o significant pain in both lower extremities, not necessarily c/w her wound locations.  Past Medical History:  Diagnosis Date  . Anemia   . Asthma 06/13/2011   hx of  . Atrial fibrillation ([place])   . Atrial flutter ([place])   . Breast cancer ([place]) dx'd 2005   No BP check or stick in Left arm  . CAD (coronary artery disease)    a. Nonobstructive CAD 2007 (EF 75%.  RCA  proximal 75% tubular, 25% prox LAD, 25% d1, 50% D2) at [facility]. b. NSTEMI 01/2010 in setting of respiratory failure, medical approach (not a good candidate for noninvasive eval)  . Cellulitis    hx of cellulitis in left leg  . Cervical radiculopathy 06/13/2011  . CHF (congestive heart failure) ([place])   . Complication of anesthesia    per patient "hard to wake up and come out of the aneshthesia, happened years ago"  . COPD (chronic obstructive pulmonary disease) ([place])    on O2  . Depression 06/13/2011  . Diverticulosis   . Esophageal dysmotility   . Fatty liver   . Gout 06/13/2011  . History of Clostridium difficile early dec 2015   no current diarrhea  . History of home oxygen therapy    2 liters per nasal cannula all the time  . Hx of dysfunctional uterine bleeding    last bleeding jan 2016, worked up for with d and c x 2 no cause found  . Hyperlipidemia   . Hypertension   . Hypothyroidism   . Impaired glucose tolerance 11/01/2012  . Myocardial infarction ([place])   . Neuropathy    per patient in hands and in feet  . Obesity hypoventilation syndrome ([place]) 06/13/2011   . OSA on CPAP   . Personal history of chemotherapy   . Personal history of radiation therapy   . PFO (patent foramen ovale)   . Presence of permanent cardiac pacemaker   . Risk for falls   . Syncope and collapse    Bradycardia with pauses. S/P pacemaker  . Tubular adenoma of colon     Past Surgical History:  Procedure Laterality Date  . APPENDECTOMY    . BREAST BIOPSY    . BREAST LUMPECTOMY Left 05/2004  . CARDIOVERSION N/A 03/15/2013   Procedure: CARDIOVERSION;  Surgeon: Thayer Headings, MD;  Location: [facility] ENDOSCOPY;  Service: Cardiovascular;  Laterality: N/A;  . CERVICAL BIOPSY  June 2013   at [facility] for Heavy  Bleeding  . [place] SURGERY  2004 or 2005   have cadaver bones,, screws, and plates c 5 to c 6  . COLONOSCOPY    . COLONOSCOPY WITH PROPOFOL N/A 10/22/2014   Procedure: COLONOSCOPY WITH PROPOFOL;  Surgeon: Jerene Bears, MD;  Location: [facility] ENDOSCOPY;  Service: Gastroenterology;  Laterality: N/A;  . DILATION AND CURETTAGE OF UTERUS    . EPIGASTRIC HERNIA REPAIR    . EPIGASTRIC HERNIA REPAIR N/A 10/29/2019   Procedure: EPIGASTRIC HERNIA REPAIR WITH MESH;  Surgeon: Coralie Keens, MD;  Location:  [facility] OR;  Service: General;  Laterality: N/A;  LMA ANESTHESIA  . EYE SURGERY    . LAPAROSCOPIC APPENDECTOMY  03/29/2012   Procedure: APPENDECTOMY LAPAROSCOPIC;  Surgeon: Adin Hector, MD;  Location: [facility] ORS;  Service: General;  Laterality: N/A;  . LOOP RECORDER EXPLANT  08/31/2013   Procedure: LOOP RECORDER EXPLANT;  Surgeon: Coralyn Mark, MD;  Location: [facility] CATH LAB;  Service: Cardiovascular;;  . LOOP RECORDER IMPLANT  08-28-2013; 08-31-2013   MDT LinQ implanted by Dr Rayann Heman for syncope; explanted 08-31-2013 after sinus pauses identified  . LOOP RECORDER IMPLANT N/A 08/28/2013   Procedure: LOOP RECORDER IMPLANT;  Surgeon: Coralyn Mark, MD;  Location: [facility] CATH LAB;  Service: Cardiovascular;  Laterality: N/A;  . OVARIAN CYST REMOVAL    . PACEMAKER INSERTION  08-31-2013   Biotronik  Evera dual chamber pacemaker placed for neurocardiogenic syncope and sinus pauses by Dr Rayann Heman  . PERMANENT PACEMAKER INSERTION N/A 08/31/2013   Procedure: PERMANENT PACEMAKER INSERTION;  Surgeon: Coralyn Mark, MD;  Location: [facility] CATH LAB;  Service: Cardiovascular;  Laterality: N/A;  . TEE WITHOUT CARDIOVERSION N/A 03/15/2013   Procedure: TRANSESOPHAGEAL ECHOCARDIOGRAM (TEE);  Surgeon: Thayer Headings, MD;  Location: [facility] ENDOSCOPY;  Service: Cardiovascular;  Laterality: N/A;    Family History  Problem Relation Age of Onset  . Uterine cancer Mother   . Heart disease Mother   . Breast cancer Sister 49  . Ovarian cancer Sister   . Breast cancer Cousin 56  . Diabetes Brother   . Breast cancer Maternal Aunt   . Ovarian cancer Sister     Social History:  reports that she quit smoking about 21 years ago. Her smoking use included cigarettes. She has a 20.00 pack-year smoking history. She has never used smokeless tobacco. She reports current alcohol use. She reports that she does not use drugs.  Allergies:  Allergies  Allergen Reactions  . Cephalexin Other (See Comments)    Exfoliative dermatitis reaction  . Ciprofloxacin Other (See Comments)    Folliculitis   . Lisinopril Cough  . Codeine     REACTION: unknown - pt. states unaware of having reaction to codeine  . Latex Dermatitis  . Tape Dermatitis  . Cleocin [Clindamycin Hcl] Rash  . Dilaudid [Hydromorphone] Other (See Comments)    Oversedation  . Doxycycline Rash  . Penicillins Rash    Did it involve swelling of the face/tongue/throat, SOB, or low BP? No Did it involve sudden or severe rash/hives, skin peeling, or any reaction on the inside of your mouth or nose? Yes Did you need to seek medical attention at a hospital or doctor's office?Yes When did it last happen?several years ago If all above answers are "NO", may proceed with cephalosporin use.    Medications: I have reviewed the patient's current  medications.  Results for orders placed or performed during the hospital encounter of 12/30/2019 (from the past 48 hour(s))  CBC with Differential     Status: Abnormal   Collection Time: 12/26/2019 10:07 AM  Result Value Ref Range   WBC 6.9 4.0 - 10.5 K/uL   RBC 4.22 3.87 - 5.11 MIL/uL   Hemoglobin 12.9 12.0 - 15.0 g/dL   HCT 44.9 36.0 - 46.0 %   MCV 106.4 (H) 80.0 - 100.0 fL   MCH 30.6 26.0 - 34.0 pg   MCHC 28.7 (L) 30.0 - 36.0 g/dL   RDW 19.6 (H) 11.5 - 15.5 %   Platelets 171 150 - 400 K/uL  nRBC 1.7 (H) 0.0 - 0.2 %   Neutrophils Relative % 81 %   Neutro Abs 5.5 1.7 - 7.7 K/uL   Lymphocytes Relative 7 %   Lymphs Abs 0.5 (L) 0.7 - 4.0 K/uL   Monocytes Relative 12 %   Monocytes Absolute 0.8 0.1 - 1.0 K/uL   Eosinophils Relative 0 %   Eosinophils Absolute 0.0 0.0 - 0.5 K/uL   Basophils Relative 0 %   Basophils Absolute 0.0 0.0 - 0.1 K/uL   Immature Granulocytes 0 %   Abs Immature Granulocytes 0.03 0.00 - 0.07 K/uL    Comment: Performed at [facility] [address], [place], [place] [address]  Basic metabolic panel     Status: Abnormal   Collection Time: 12/28/2019 10:07 AM  Result Value Ref Range   Sodium 147 (H) 135 - 145 mmol/L   Potassium 5.7 (H) 3.5 - 5.1 mmol/L   Chloride 113 (H) 98 - 111 mmol/L   CO2 21 (L) 22 - 32 mmol/L   Glucose, Bld 99 70 - 99 mg/dL    Comment: Glucose reference range applies only to samples taken after fasting for at least 8 hours.   BUN 67 (H) 8 - 23 mg/dL   Creatinine, Ser 2.48 (H) 0.44 - 1.00 mg/dL   Calcium 8.9 8.9 - 10.3 mg/dL   GFR calc non Af Amer 19 (L) >60 mL/min   GFR calc Af Amer 22 (L) >60 mL/min   Anion gap 13 5 - 15    Comment: Performed at [facility] [address], [place], [place] [address]  Hepatic function panel     Status: Abnormal   Collection Time: 12/28/2019 10:07 AM  Result Value Ref Range   Total Protein 6.3 (L) 6.5 - 8.1 g/dL   Albumin 3.3 (L) 3.5 - 5.0 g/dL   AST 115 (H) 15 - 41 U/L   ALT 55 (H) 0 - 44  U/L   Alkaline Phosphatase 155 (H) 38 - 126 U/L   Total Bilirubin 2.9 (H) 0.3 - 1.2 mg/dL   Bilirubin, Direct 1.4 (H) 0.0 - 0.2 mg/dL   Indirect Bilirubin 1.5 (H) 0.3 - 0.9 mg/dL    Comment: Performed at [facility] [address] [place], [place] [address]  Lipase, blood     Status: None   Collection Time: 12/25/2019 10:07 AM  Result Value Ref Range   Lipase 19 11 - 51 U/L    Comment: Performed at [facility] [address], [place], [place] [address]  CK     Status: Abnormal   Collection Time: 12/27/2019 10:07 AM  Result Value Ref Range   Total CK 2,047 (H) 38 - 234 U/L    Comment: Performed at [facility], [place] [address], [place], [place] [address]  Troponin I (High Sensitivity)     Status: Abnormal   Collection Time: 01/08/2020 10:07 AM  Result Value Ref Range   Troponin I (High Sensitivity) 65 (H) <18 ng/L    Comment: (NOTE) Elevated high sensitivity troponin I (hsTnI) values and significant  changes across serial measurements may suggest ACS but many other  chronic and acute conditions are known to elevate hsTnI results.  Refer to the "Links" section for chest pain algorithms and additional  guidance. Performed at [facility], [place] [address], [place], [place] [address]   SARS CORONAVIRUS 2 (TAT 6-24 HRS) Nasopharyngeal Urine, Clean Catch     Status: None  Collection Time: 12/23/2019 12:08 PM   Specimen: Urine, Clean Catch; Nasopharyngeal  Result Value Ref Range   SARS Coronavirus 2 NEGATIVE NEGATIVE    Comment: (NOTE) SARS-CoV-2 target nucleic acids are NOT DETECTED. The SARS-CoV-2 RNA is generally detectable in upper and lower respiratory specimens during the acute phase of infection. Negative results do not preclude SARS-CoV-2 infection, do not rule out co-infections with other pathogens, and should not be used as the sole basis for treatment or other patient management decisions. Negative results must be combined with clinical  observations, patient history, and epidemiological information. The expected result is Negative. Fact Sheet for Patients: SugarRoll.be Fact Sheet for Healthcare Providers: https://www.woods-mathews.com/ This test is not yet approved or cleared by the [place] FDA and  has been authorized for detection and/or diagnosis of SARS-CoV-2 by FDA under an Emergency Use Authorization (EUA). This EUA will remain  in effect (meaning this test can be used) for the duration of the COVID-19 declaration under Section 56 4(b)(1) of the Act, 21 U.S.C. section 360bbb-3(b)(1), unless the authorization is terminated or revoked sooner. Performed at [facility], [place] [address], [place], [place] [address]   Urinalysis, Routine w reflex microscopic     Status: Abnormal   Collection Time: 12/27/2019  1:09 PM  Result Value Ref Range   Color, Urine AMBER (A) YELLOW    Comment: BIOCHEMICALS MAY BE AFFECTED BY COLOR   APPearance CLOUDY (A) CLEAR   Specific Gravity, Urine 1.012 1.005 - 1.030   pH 5.0 5.0 - 8.0   Glucose, UA NEGATIVE NEGATIVE mg/dL   Hgb urine dipstick LARGE (A) NEGATIVE   Bilirubin Urine NEGATIVE NEGATIVE   Ketones, ur NEGATIVE NEGATIVE mg/dL   Protein, ur 100 (A) NEGATIVE mg/dL   Nitrite NEGATIVE NEGATIVE   Leukocytes,Ua NEGATIVE NEGATIVE   RBC / HPF 0-5 0 - 5 RBC/hpf   WBC, UA 0-5 0 - 5 WBC/hpf   Bacteria, UA NONE SEEN NONE SEEN   Mucus PRESENT    Hyaline Casts, UA PRESENT     Comment: Performed at [facility], [place] [address] [place], [place] [address]  Urine culture     Status: None   Collection Time: 01/14/2020  1:09 PM   Specimen: Urine, Random  Result Value Ref Range   Specimen Description URINE, RANDOM    Special Requests NONE    Culture      NO GROWTH Performed at [facility], [place] [address], [place], [place] [address]    Report Status 12/30/2019 FINAL   Uric acid     Status: Abnormal   Collection Time:  01/11/2020  2:18 PM  Result Value Ref Range   Uric Acid, Serum 15.3 (H) 2.5 - 7.1 mg/dL    Comment: Performed at [facility] [address], [place], [place] [address]  TSH     Status: Abnormal   Collection Time: 01/04/2020  2:18 PM  Result Value Ref Range   TSH 6.633 (H) 0.350 - 4.500 uIU/mL    Comment: Performed by a 3rd Generation assay with a functional sensitivity of <=0.01 uIU/mL. Performed at [facility], [place] [address] [place], [place] [address]   POCT I-Stat EG7     Status: Abnormal   Collection Time: 01/02/2020  2:23 PM  Result Value Ref Range   pH, Ven 7.342 7.250 - 7.430   pCO2, Ven 42.3 (L) 44.0 - 60.0 mmHg   pO2, Ven 66.0 (H) 32.0 - 45.0 mmHg  Bicarbonate 22.9 20.0 - 28.0 mmol/L   TCO2 24 22 - 32 mmol/L   O2 Saturation 92.0 %   Acid-base deficit 3.0 (H) 0.0 - 2.0 mmol/L   Sodium 147 (H) 135 - 145 mmol/L   Potassium 5.6 (H) 3.5 - 5.1 mmol/L   Calcium, Ion 1.01 (L) 1.15 - 1.40 mmol/L   HCT 43.0 36.0 - 46.0 %   Hemoglobin 14.6 12.0 - 15.0 g/dL   Patient temperature HIDE    Sample type VENOUS   Heparin level (unfractionated)     Status: Abnormal   Collection Time: 01/05/2020  5:51 PM  Result Value Ref Range   Heparin Unfractionated >2.20 (H) 0.30 - 0.70 IU/mL    Comment: RESULTS CONFIRMED BY MANUAL DILUTION (NOTE) If heparin results are below expected values, and patient dosage has  been confirmed, suggest follow up testing of antithrombin III levels. Performed at [facility], [place] [address], [place], [place] [address]   APTT     Status: None   Collection Time: 01/10/2020  5:51 PM  Result Value Ref Range   aPTT 35 24 - 36 seconds    Comment: Performed at [facility] [address], [place], [place] [address]  Comprehensive metabolic panel     Status: Abnormal   Collection Time: 12/30/19  3:29 AM  Result Value Ref Range   Sodium 151 (H) 135 - 145 mmol/L   Potassium 5.2 (H) 3.5 - 5.1 mmol/L   Chloride 114 (H) 98 - 111 mmol/L   CO2 22 22  - 32 mmol/L   Glucose, Bld 76 70 - 99 mg/dL    Comment: Glucose reference range applies only to samples taken after fasting for at least 8 hours.   BUN 67 (H) 8 - 23 mg/dL   Creatinine, Ser 2.53 (H) 0.44 - 1.00 mg/dL   Calcium 9.5 8.9 - 10.3 mg/dL   Total Protein 6.8 6.5 - 8.1 g/dL   Albumin 3.4 (L) 3.5 - 5.0 g/dL   AST 86 (H) 15 - 41 U/L   ALT 49 (H) 0 - 44 U/L   Alkaline Phosphatase 167 (H) 38 - 126 U/L   Total Bilirubin 2.7 (H) 0.3 - 1.2 mg/dL   GFR calc non Af Amer 18 (L) >60 mL/min   GFR calc Af Amer 21 (L) >60 mL/min   Anion gap 15 5 - 15    Comment: Performed at [facility] [address] [place], [place] [address]  CBC     Status: Abnormal   Collection Time: 12/30/19  3:29 AM  Result Value Ref Range   WBC 6.7 4.0 - 10.5 K/uL   RBC 4.17 3.87 - 5.11 MIL/uL   Hemoglobin 12.8 12.0 - 15.0 g/dL   HCT 45.2 36.0 - 46.0 %   MCV 108.4 (H) 80.0 - 100.0 fL   MCH 30.7 26.0 - 34.0 pg   MCHC 28.3 (L) 30.0 - 36.0 g/dL   RDW 19.2 (H) 11.5 - 15.5 %   Platelets 142 (L) 150 - 400 K/uL   nRBC 1.8 (H) 0.0 - 0.2 %    Comment: Performed at [facility], [place] [address], [place], [place] [address]  CK     Status: Abnormal   Collection Time: 12/30/19  3:29 AM  Result Value Ref Range   Total CK 1,022 (H) 38 - 234 U/L    Comment: Performed at [facility], [place] [address], [place], [place] [address]  Heparin  level (unfractionated)     Status: Abnormal   Collection Time: 12/30/19  3:29 AM  Result Value Ref Range   Heparin Unfractionated >2.20 (H) 0.30 - 0.70 IU/mL    Comment: RESULTS CONFIRMED BY MANUAL DILUTION (NOTE) If heparin results are below expected values, and patient dosage has  been confirmed, suggest follow up testing of antithrombin III levels. Performed at [facility], [place] [address], [place], [place] [address]   APTT     Status: None   Collection Time: 12/30/19  3:29 AM  Result Value Ref Range   aPTT 29 24 - 36 seconds    Comment: Performed at  [facility] [address] [place], [place] [address]  Bilirubin, direct     Status: Abnormal   Collection Time: 12/30/19  3:29 AM  Result Value Ref Range   Bilirubin, Direct 1.3 (H) 0.0 - 0.2 mg/dL    Comment: Performed at [facility] [address] [place], [place] [address]  Procalcitonin     Status: None   Collection Time: 12/30/19  3:29 AM  Result Value Ref Range   Procalcitonin 0.85 ng/mL    Comment:        Interpretation: PCT > 0.5 ng/mL and <= 2 ng/mL: Systemic infection (sepsis) is possible, but other conditions are known to elevate PCT as well. (NOTE)       Sepsis PCT Algorithm           Lower Respiratory Tract                                      Infection PCT Algorithm    ----------------------------     ----------------------------         PCT < 0.25 ng/mL                PCT < 0.10 ng/mL         Strongly encourage             Strongly discourage   discontinuation of antibiotics    initiation of antibiotics    ----------------------------     -----------------------------       PCT 0.25 - 0.50 ng/mL            PCT 0.10 - 0.25 ng/mL               OR       >80% decrease in PCT            Discourage initiation of                                            antibiotics      Encourage discontinuation           of antibiotics    ----------------------------     -----------------------------         PCT >= 0.50 ng/mL              PCT 0.26 - 0.50 ng/mL                AND       <80% decrease in PCT             Encourage initiation of  antibiotics       Encourage continuation           of antibiotics    ----------------------------     -----------------------------        PCT >= 0.50 ng/mL                  PCT > 0.50 ng/mL               AND         increase in PCT                  Strongly encourage                                      initiation of antibiotics    Strongly encourage escalation           of  antibiotics                                     -----------------------------                                           PCT <= 0.25 ng/mL                                                 OR                                        > 80% decrease in PCT                                     Discontinue / Do not initiate                                             antibiotics Performed at [facility], [address] [place], [place] [address]   Lactic acid, plasma     Status: Abnormal   Collection Time: 12/30/19  8:16 AM  Result Value Ref Range   Lactic Acid, Venous 3.1 (HH) 0.5 - 1.9 mmol/L    Comment: CRITICAL RESULT CALLED TO, READ BACK BY AND VERIFIED WITH: E.UMSTOT  RN @ [phone] 12/30/2019 BY C.EDENS Performed at [facility], [place] [address], [place], [place] [address]   Basic metabolic panel     Status: Abnormal   Collection Time: 12/30/19  8:16 AM  Result Value Ref Range   Sodium 152 (H) 135 - 145 mmol/L   Potassium 4.9 3.5 - 5.1 mmol/L   Chloride 115 (H) 98 - 111 mmol/L   CO2 23 22 - 32 mmol/L   Glucose, Bld 83 70 - 99 mg/dL    Comment: Glucose reference range applies only to samples taken after fasting for at least 8 hours.   BUN  70 (H) 8 - 23 mg/dL   Creatinine, Ser 2.57 (H) 0.44 - 1.00 mg/dL   Calcium 9.2 8.9 - 10.3 mg/dL   GFR calc non Af Amer 18 (L) >60 mL/min   GFR calc Af Amer 21 (L) >60 mL/min   Anion gap 14 5 - 15    Comment: Performed at [facility] [address], [place], [place] [address]  T4, free     Status: None   Collection Time: 12/30/19  8:16 AM  Result Value Ref Range   Free T4 0.92 0.61 - 1.12 ng/dL    Comment: (NOTE) Biotin ingestion may interfere with free T4 tests. If the results are inconsistent with the TSH level, previous test results, or the clinical presentation, then consider biotin interference. If needed, order repeat testing after stopping biotin. Performed at [facility], [place] [address], [place],  [place] [address]   Ammonia     Status: Abnormal   Collection Time: 12/30/19  8:16 AM  Result Value Ref Range   Ammonia 52 (H) 9 - 35 umol/L    Comment: Performed at [facility], [place] [address] [place], [place] [address]  RPR     Status: None   Collection Time: 12/30/19  8:16 AM  Result Value Ref Range   RPR Ser Ql NON REACTIVE NON REACTIVE    Comment: Performed at [facility] [address], [place], [place] [address]  Vitamin B12     Status: Abnormal   Collection Time: 12/30/19  8:16 AM  Result Value Ref Range   Vitamin B-12 5,768 (H) 180 - 914 pg/mL    Comment: (NOTE) This assay is not validated for testing neonatal or myeloproliferative syndrome specimens for Vitamin B12 levels. Performed at [facility], [place] [address] [place], [place] [address]   Folate     Status: None   Collection Time: 12/30/19  8:16 AM  Result Value Ref Range   Folate 9.1 >5.9 ng/mL    Comment: Performed at [facility], [place] [address] [place], [place] [address]  Blood gas, arterial     Status: Abnormal   Collection Time: 12/30/19  9:12 AM  Result Value Ref Range   FIO2 28.00    pH, Arterial 7.301 (L) 7.350 - 7.450   pCO2 arterial 47.2 32.0 - 48.0 mmHg   pO2, Arterial 77.6 (L) 83.0 - 108.0 mmHg   Bicarbonate 22.7 20.0 - 28.0 mmol/L   Acid-base deficit 2.8 (H) 0.0 - 2.0 mmol/L   O2 Saturation 94.7 %   Patient temperature 36.7    Collection site LEFT RADIAL    Drawn by COLLECTED BY RT     Comment: [id number]   Sample type ARTERIAL DRAW    Allens test (pass/fail) PASS PASS    Comment: Performed at [facility], [place] [address] [place], [place] [address]  Glucose, capillary     Status: Abnormal   Collection Time: 12/30/19 10:21 AM  Result Value Ref Range   Glucose-Capillary 61 (L) 70 - 99 mg/dL    Comment: Glucose reference range applies only to samples taken after fasting for at least 8 hours.  Lactic acid, plasma     Status: Abnormal   Collection Time: 12/30/19 10:43 AM   Result Value Ref Range   Lactic Acid, Venous 3.1 (HH) 0.5 - 1.9 mmol/L    Comment: CRITICAL VALUE NOTED.  VALUE IS CONSISTENT WITH PREVIOUSLY REPORTED AND CALLED VALUE. Performed  at [facility], [place] [address] [place], [place] [address]   Basic metabolic panel     Status: Abnormal   Collection Time: 12/30/19 10:43 AM  Result Value Ref Range   Sodium 152 (H) 135 - 145 mmol/L   Potassium 5.1 3.5 - 5.1 mmol/L   Chloride 115 (H) 98 - 111 mmol/L   CO2 22 22 - 32 mmol/L   Glucose, Bld 83 70 - 99 mg/dL    Comment: Glucose reference range applies only to samples taken after fasting for at least 8 hours.   BUN 70 (H) 8 - 23 mg/dL   Creatinine, Ser 2.58 (H) 0.44 - 1.00 mg/dL   Calcium 9.1 8.9 - 10.3 mg/dL   GFR calc non Af Amer 18 (L) >60 mL/min   GFR calc Af Amer 21 (L) >60 mL/min   Anion gap 15 5 - 15    Comment: Performed at [facility] [address], [place], [place] [address]  Glucose, capillary     Status: None   Collection Time: 12/30/19 11:41 AM  Result Value Ref Range   Glucose-Capillary 74 70 - 99 mg/dL    Comment: Glucose reference range applies only to samples taken after fasting for at least 8 hours.   Comment 1 Notify RN    Comment 2 Document in Chart   Blood gas, venous     Status: None   Collection Time: 12/30/19 12:10 PM  Result Value Ref Range   FIO2 28.00    pH, Ven 7.270 7.250 - 7.430   pCO2, Ven 55.3 44.0 - 60.0 mmHg   Bicarbonate 24.6 20.0 - 28.0 mmol/L   Acid-base deficit 1.4 0.0 - 2.0 mmol/L   O2 Saturation 68.9 %   Patient temperature 37.0     Comment: Performed at [facility] [address], [place], [place] [address]  Glucose, capillary     Status: None   Collection Time: 12/30/19 12:32 PM  Result Value Ref Range   Glucose-Capillary 96 70 - 99 mg/dL    Comment: Glucose reference range applies only to samples taken after fasting for at least 8 hours.  APTT     Status: None   Collection Time: 12/30/19  1:04 PM  Result Value Ref  Range   aPTT 30 24 - 36 seconds    Comment: Performed at [facility], [place] [address], [place],  [address]  Basic metabolic panel     Status: Abnormal   Collection Time: 12/30/19  1:04 PM  Result Value Ref Range   Sodium 151 (H) 135 - 145 mmol/L   Potassium 4.9 3.5 - 5.1 mmol/L   Chloride 115 (H) 98 - 111 mmol/L   CO2 21 (L) 22 - 32 mmol/L   Glucose, Bld 105 (H) 70 - 99 mg/dL    Comment: Glucose reference range applies only to samples taken after fasting for at least 8 hours.   BUN 74 (H) 8 - 23 mg/dL   Creatinine, Ser 2.61 (H) 0.44 - 1.00 mg/dL   Calcium 9.0 8.9 - 10.3 mg/dL   GFR calc non Af Amer 18 (L) >60 mL/min   GFR calc Af Amer 20 (L) >60 mL/min   Anion gap 15 5 - 15    Comment: Performed at [facility] [address], [place], [place] [address]  Glucose, capillary     Status: Abnormal   Collection Time: 12/30/19  5:17 PM  Result Value Ref Range  Glucose-Capillary 117 (H) 70 - 99 mg/dL    Comment: Glucose reference range applies only to samples taken after fasting for at least 8 hours.  Basic metabolic panel     Status: Abnormal   Collection Time: 12/30/19  6:02 PM  Result Value Ref Range   Sodium 146 (H) 135 - 145 mmol/L   Potassium 4.7 3.5 - 5.1 mmol/L   Chloride 113 (H) 98 - 111 mmol/L   CO2 23 22 - 32 mmol/L   Glucose, Bld 130 (H) 70 - 99 mg/dL    Comment: Glucose reference range applies only to samples taken after fasting for at least 8 hours.   BUN 73 (H) 8 - 23 mg/dL   Creatinine, Ser 2.58 (H) 0.44 - 1.00 mg/dL   Calcium 8.6 (L) 8.9 - 10.3 mg/dL   GFR calc non Af Amer 18 (L) >60 mL/min   GFR calc Af Amer 21 (L) >60 mL/min   Anion gap 10 5 - 15    Comment: Performed at [facility] [address], [place], [place] [address]  APTT     Status: Abnormal   Collection Time: 12/30/19 10:30 PM  Result Value Ref Range   aPTT >200 (HH) 24 - 36 seconds    Comment:        IF BASELINE aPTT IS ELEVATED, SUGGEST PATIENT RISK ASSESSMENT BE  USED TO DETERMINE APPROPRIATE ANTICOAGULANT THERAPY. REPEATED TO VERIFY CRITICAL RESULT CALLED TO, READ BACK BY AND VERIFIED WITH: Olen Pel I, RN AT 0012 ON 12/31/2019 BY Epifanio Lesches S Performed at [facility], [place] [address] [place], [place] [address]   Basic metabolic panel     Status: Abnormal   Collection Time: 12/30/19 10:30 PM  Result Value Ref Range   Sodium 150 (H) 135 - 145 mmol/L   Potassium 4.3 3.5 - 5.1 mmol/L   Chloride 114 (H) 98 - 111 mmol/L   CO2 25 22 - 32 mmol/L   Glucose, Bld 137 (H) 70 - 99 mg/dL    Comment: Glucose reference range applies only to samples taken after fasting for at least 8 hours.   BUN 72 (H) 8 - 23 mg/dL   Creatinine, Ser 2.40 (H) 0.44 - 1.00 mg/dL   Calcium 8.5 (L) 8.9 - 10.3 mg/dL   GFR calc non Af Amer 19 (L) >60 mL/min   GFR calc Af Amer 22 (L) >60 mL/min   Anion gap 11 5 - 15    Comment: Performed at [facility] [address] [place], [place] [address]  Glucose, capillary     Status: Abnormal   Collection Time: 12/31/19 12:00 AM  Result Value Ref Range   Glucose-Capillary 38 (LL) 70 - 99 mg/dL    Comment: Glucose reference range applies only to samples taken after fasting for at least 8 hours.   Comment 1 Notify RN   Glucose, capillary     Status: Abnormal   Collection Time: 12/31/19 12:03 AM  Result Value Ref Range   Glucose-Capillary 129 (H) 70 - 99 mg/dL    Comment: Glucose reference range applies only to samples taken after fasting for at least 8 hours.  APTT     Status: Abnormal   Collection Time: 12/31/19 12:43 AM  Result Value Ref Range   aPTT 169 (HH) 24 - 36 seconds    Comment:        IF BASELINE aPTT IS ELEVATED, SUGGEST PATIENT RISK ASSESSMENT BE USED TO DETERMINE APPROPRIATE ANTICOAGULANT  THERAPY. REPEATED TO VERIFY CRITICAL RESULT CALLED TO, READ BACK BY AND VERIFIED WITH: Ashok Croon I, RN AT 0120 ON 12/31/2019 BY Epifanio Lesches S Performed at [facility], [place] [address] [place], [place]  [address]   Heparin level (unfractionated)     Status: Abnormal   Collection Time: 12/31/19  2:17 AM  Result Value Ref Range   Heparin Unfractionated >2.20 (H) 0.30 - 0.70 IU/mL    Comment: RESULTS CONFIRMED BY MANUAL DILUTION (NOTE) If heparin results are below expected values, and patient dosage has  been confirmed, suggest follow up testing of antithrombin III levels. Performed at [facility], [place] [address], [place], [place] [address]   Procalcitonin     Status: None   Collection Time: 12/31/19  2:17 AM  Result Value Ref Range   Procalcitonin 0.69 ng/mL    Comment:        Interpretation: PCT > 0.5 ng/mL and <= 2 ng/mL: Systemic infection (sepsis) is possible, but other conditions are known to elevate PCT as well. (NOTE)       Sepsis PCT Algorithm           Lower Respiratory Tract                                      Infection PCT Algorithm    ----------------------------     ----------------------------         PCT < 0.25 ng/mL                PCT < 0.10 ng/mL         Strongly encourage             Strongly discourage   discontinuation of antibiotics    initiation of antibiotics    ----------------------------     -----------------------------       PCT 0.25 - 0.50 ng/mL            PCT 0.10 - 0.25 ng/mL               OR       >80% decrease in PCT            Discourage initiation of                                            antibiotics      Encourage discontinuation           of antibiotics    ----------------------------     -----------------------------         PCT >= 0.50 ng/mL              PCT 0.26 - 0.50 ng/mL                AND       <80% decrease in PCT             Encourage initiation of                                             antibiotics       Encourage continuation           of antibiotics    ----------------------------     -----------------------------  PCT >= 0.50 ng/mL                  PCT > 0.50 ng/mL               AND         increase in  PCT                  Strongly encourage                                      initiation of antibiotics    Strongly encourage escalation           of antibiotics                                     -----------------------------                                           PCT <= 0.25 ng/mL                                                 OR                                        > 80% decrease in PCT                                     Discontinue / Do not initiate                                             antibiotics Performed at [facility], [address] [place], [place] [address]   C-reactive protein     Status: Abnormal   Collection Time: 12/31/19  2:17 AM  Result Value Ref Range   CRP 8.5 (H) <1.0 mg/dL    Comment: Performed at [facility] [address] [place], [place] [address]  Basic metabolic panel     Status: Abnormal   Collection Time: 12/31/19  2:17 AM  Result Value Ref Range   Sodium 152 (H) 135 - 145 mmol/L   Potassium 4.3 3.5 - 5.1 mmol/L   Chloride 116 (H) 98 - 111 mmol/L   CO2 26 22 - 32 mmol/L   Glucose, Bld 137 (H) 70 - 99 mg/dL    Comment: Glucose reference range applies only to samples taken after fasting for at least 8 hours.   BUN 69 (H) 8 - 23 mg/dL   Creatinine, Ser 2.53 (H) 0.44 - 1.00 mg/dL   Calcium 8.5 (L) 8.9 - 10.3 mg/dL   GFR calc non Af Amer 18 (L) >60 mL/min   GFR calc Af Amer 21 (L) >60 mL/min   Anion gap 10 5 - 15    Comment: Performed at  [facility], [place] [address] [place],  [address]  APTT     Status: Abnormal   Collection Time: 12/31/19  2:17 AM  Result Value Ref Range   aPTT 167 (HH) 24 - 36 seconds    Comment:        IF BASELINE aPTT IS ELEVATED, SUGGEST PATIENT RISK ASSESSMENT BE USED TO DETERMINE APPROPRIATE ANTICOAGULANT THERAPY. REPEATED TO VERIFY CRITICAL RESULT CALLED TO, READ BACK BY AND VERIFIED WITH: Venia Minks, RN AT [phone] ON 12/31/2019 BY Epifanio Lesches S Performed at [facility], [place] [address] [place], [place] [address]   CBC     Status: Abnormal   Collection Time: 12/31/19  3:24 AM  Result Value Ref Range   WBC 3.9 (L) 4.0 - 10.5 K/uL   RBC 3.26 (L) 3.87 - 5.11 MIL/uL   Hemoglobin 9.9 (L) 12.0 - 15.0 g/dL    Comment: REPEATED TO VERIFY   HCT 33.8 (L) 36.0 - 46.0 %   MCV 103.7 (H) 80.0 - 100.0 fL   MCH 30.4 26.0 - 34.0 pg   MCHC 29.3 (L) 30.0 - 36.0 g/dL   RDW 18.6 (H) 11.5 - 15.5 %   Platelets 110 (L) 150 - 400 K/uL    Comment: SPECIMEN CHECKED FOR CLOTS Immature Platelet Fraction may be clinically indicated, consider ordering this additional test [id number] PLATELET COUNT CONFIRMED BY SMEAR REPEATED TO VERIFY    nRBC 1.0 (H) 0.0 - 0.2 %    Comment: Performed at [facility], [place] [address], [place], [place] [address]  Glucose, capillary     Status: Abnormal   Collection Time: 12/31/19  4:15 AM  Result Value Ref Range   Glucose-Capillary 273 (H) 70 - 99 mg/dL    Comment: Glucose reference range applies only to samples taken after fasting for at least 8 hours.    DG Chest 1 View  Result Date: 12/30/2019 CLINICAL DATA:  Central line placement EXAM: CHEST  1 VIEW COMPARISON:  Chest x-ray 12/30/2019 at 10:19 a.m. FINDINGS: Dual lead pacer device remains in place. Left-sided central venous catheter takes an unusual course, extending along the left upper chest, passing beyond the expected boundary of the left brachiocephalic vein and extending along the left lateral margin of the thoracic aorta in the superior mediastinum. Heart size remains enlarged. No signs of large effusion on the current study. No pneumothorax on semi erect view. Lungs are clear. Visualized skeletal structures without acute finding. Cervical spinal fusion as before. IMPRESSION: Abnormal course of left sided central venous line, this may be within a tributary of the brachiocephalic venous access system, sub primum intercostal vein or hemi azygous branch, potentially also with arterial  placement based on position. Extra-venous extension into the mediastinum or pleural space is also considered. Chest CT may be helpful to determine next steps for management. Cardiomegaly with dual lead pacer device. Critical Value/emergent results were called by telephone at the time of interpretation on 12/30/2019 at 11:57 a.m. to provider NP Noe Gens, who verbally acknowledged these results. Electronically Signed   By: Zetta Bills M.D.   On: 12/30/2019 12:15   DG Elbow 2 Views Right  Result Date: 12/30/2019 CLINICAL DATA:  Possible pressure ulcer on right elbow. EXAM: RIGHT ELBOW - 2 VIEW COMPARISON:  None. FINDINGS: Bandaging is seen over the posterior right elbow. No fracture or joint effusion identified. No bony erosion. No foreign body noted. IMPRESSION: Soft tissue edema. No fracture, erosion, foreign body, or bony ulceration.  Electronically Signed   By: Dorise Bullion III M.D   On: 12/30/2019 12:30   CT HEAD WO CONTRAST  Result Date: 12/30/2019 CLINICAL DATA:  74 year old female with encephalopathy. Recent fall. EXAM: CT HEAD WITHOUT CONTRAST TECHNIQUE: Contiguous axial images were obtained from the base of the skull through the vertex without intravenous contrast. COMPARISON:  Head and cervical spine CT yesterday. Head CT 11/01/2019 and earlier. FINDINGS: Brain: Gray-white matter differentiation appears stable and within normal limits for age. No midline shift, ventriculomegaly, mass effect, evidence of mass lesion, intracranial hemorrhage or evidence of cortically based acute infarction. There are multiple scattered small punctate calcifications in the subarachnoid spaces, mostly in the left hemisphere. This is stable and probably postinflammatory, rather than due to distal vessel calcified atherosclerosis. Vascular: Calcified atherosclerosis at the skull base. No suspicious intracranial vascular hyperdensity. Skull: No acute osseous abnormality identified. Sinuses/Orbits: Visualized  paranasal sinuses and mastoids are stable and well pneumatized. Other: Broad-based right side scalp hematoma appears stable. No scalp soft tissue gas. Orbits soft tissues appear intact. IMPRESSION: 1. Stable and negative for age non contrast CT appearance of the brain. 2. Broad-based right scalp hematoma. Electronically Signed   By: Genevie Ann M.D.   On: 12/30/2019 17:05   CT Head Wo Contrast  Result Date: 01/08/2020 CLINICAL DATA:  Fall. EXAM: CT HEAD WITHOUT CONTRAST CT CERVICAL SPINE WITHOUT CONTRAST TECHNIQUE: Multidetector CT imaging of the head and cervical spine was performed following the standard protocol without intravenous contrast. Multiplanar CT image reconstructions of the cervical spine were also generated. COMPARISON:  None. FINDINGS: CT HEAD FINDINGS Brain: No acute intracranial hemorrhage. No focal mass lesion. No CT evidence of acute infarction. No midline shift or mass effect. No hydrocephalus. Basilar cisterns are patent. There are periventricular and subcortical white matter hypodensities. Generalized cortical atrophy. Vascular: No hyperdense vessel or unexpected calcification. Skull: Normal. Negative for fracture or focal lesion. Sinuses/Orbits: Paranasal sinuses and mastoid air cells are clear. Orbits are clear. Other: None. CT CERVICAL SPINE FINDINGS Alignment: Normal alignment of the cervical vertebral bodies. Skull base and vertebrae: Normal craniocervical junction. No loss of vertebral body height or disc height. Normal facet articulation. No evidence of fracture. Soft tissues and spinal canal: No prevertebral soft tissue swelling. No perispinal or epidural hematoma. Disc levels: Anterior cervical fusion C5-C6. Bulky osteophytes at C3-C4. No subluxation. Additional osteophytosis from C5-T1. Upper chest: Clear Other: None IMPRESSION: 1. No acute intracranial findings. 2. Mild atrophy and white matter microvascular disease. 3. No cervical spine fracture. 4. Stable anterior cervical fusion.  Electronically Signed   By: Suzy Bouchard M.D.   On: 01/12/2020 12:09   CT Cervical Spine Wo Contrast  Result Date: 01/13/2020 CLINICAL DATA:  Fall. EXAM: CT HEAD WITHOUT CONTRAST CT CERVICAL SPINE WITHOUT CONTRAST TECHNIQUE: Multidetector CT imaging of the head and cervical spine was performed following the standard protocol without intravenous contrast. Multiplanar CT image reconstructions of the cervical spine were also generated. COMPARISON:  None. FINDINGS: CT HEAD FINDINGS Brain: No acute intracranial hemorrhage. No focal mass lesion. No CT evidence of acute infarction. No midline shift or mass effect. No hydrocephalus. Basilar cisterns are patent. There are periventricular and subcortical white matter hypodensities. Generalized cortical atrophy. Vascular: No hyperdense vessel or unexpected calcification. Skull: Normal. Negative for fracture or focal lesion. Sinuses/Orbits: Paranasal sinuses and mastoid air cells are clear. Orbits are clear. Other: None. CT CERVICAL SPINE FINDINGS Alignment: Normal alignment of the cervical vertebral bodies. Skull base and vertebrae: Normal craniocervical junction. No  loss of vertebral body height or disc height. Normal facet articulation. No evidence of fracture. Soft tissues and spinal canal: No prevertebral soft tissue swelling. No perispinal or epidural hematoma. Disc levels: Anterior cervical fusion C5-C6. Bulky osteophytes at C3-C4. No subluxation. Additional osteophytosis from C5-T1. Upper chest: Clear Other: None IMPRESSION: 1. No acute intracranial findings. 2. Mild atrophy and white matter microvascular disease. 3. No cervical spine fracture. 4. Stable anterior cervical fusion. Electronically Signed   By: Suzy Bouchard M.D.   On: 12/22/2019 12:09   US RENAL  Result Date: 12/28/2019 CLINICAL DATA:  Acute kidney injury EXAM: RENAL / URINARY TRACT ULTRASOUND COMPLETE COMPARISON:  CT 11/01/2019 FINDINGS: Right Kidney: Renal measurements: 9.9 x 4.6 x 4.1 cm  = volume: 95.9 mL . Echogenicity within normal limits. No mass or hydronephrosis visualized. Left Kidney: Renal measurements: 10.3 x 5.9 x 4.1 cm = volume: 127.6 mL. Cortical echogenicity normal. No hydronephrosis. Poorly visible. Cyst on CT is not well visualized on sonography. Bladder: Not well visualized and may be empty. Other: Ascites in the upper abdomen. Possible contour nodularity of the liver. IMPRESSION: 1. Kidneys show no hydronephrosis. Left kidney is poorly visible. The cyst seen within the left kidney on CT is not well seen on ultrasound. 2. Small amount of ascites in the right upper quadrant. Possible cirrhotic change of the liver Electronically Signed   By: Donavan Foil M.D.   On: 01/11/2020 21:32   DG Pelvis Portable  Result Date: 01/03/2020 CLINICAL DATA:  Fall. Unsure how long patient was on the floor unattended. pain EXAM: PORTABLE PELVIS 1-2 VIEWS COMPARISON:  None. FINDINGS: Hip hips located. No pelvic fracture. No hip fracture. Degenerative changes at the hip joints IMPRESSION: No acute pelvic findings.  Degenerative change of the hips. Electronically Signed   By: Suzy Bouchard M.D.   On: 01/13/2020 11:35   DG CHEST PORT 1 VIEW  Result Date: 12/31/2019 CLINICAL DATA:  Acute respiratory failure with hypoxia. EXAM: PORTABLE CHEST 1 VIEW COMPARISON:  12/30/2019 FINDINGS: Stable enlargement of the cardiac silhouette. Stable position of the dual lead cardiac pacemaker. Left jugular central line is stable and the tip is pointing either anteriorly or posteriorly and probably within a small branch of the left innominate vein. Left jugular central line placement is stable from the previous examination. Negative for pneumothorax. Prominent interstitial lung markings bilaterally. Extensive degenerative changes in both shoulders. Surgical plate in the lower cervical spine. IMPRESSION: 1. Stable cardiomegaly. 2. Prominent interstitial lung markings. Cannot exclude interstitial edema. 3. No  change in the appearance or position of the left jugular central line. Suspect that the line is extending into a small branch off the left innominate vein. Electronically Signed   By: Markus Daft M.D.   On: 12/31/2019 08:13   DG CHEST PORT 1 VIEW  Result Date: 12/30/2019 CLINICAL DATA:  Shortness of breath EXAM: PORTABLE CHEST 1 VIEW COMPARISON:  December 29, 2019 FINDINGS: Stable cardiomegaly. A pacemaker is noted. The hila and mediastinum are normal. No pneumothorax. No nodules or masses. No focal infiltrates or overt edema. IMPRESSION: No active disease. Electronically Signed   By: Dorise Bullion III M.D   On: 12/30/2019 12:31   DG Chest Portable 1 View  Result Date: 01/16/2020 CLINICAL DATA:  Fall. Unsure how long patient was on the floor unattended. pain EXAM: PORTABLE CHEST 1 VIEW COMPARISON:  11/23/2019 FINDINGS: Enlarged cardiac silhouette. No effusion, infiltrate pneumothorax. Prominent hilar vasculature. Anterior cervical fusion IMPRESSION: Cardiomegaly without acute findings.  No  change from Electronically Signed   By: Suzy Bouchard M.D.   On: 01/06/2020 11:34   ECHOCARDIOGRAM COMPLETE  Result Date: 12/30/2019    ECHOCARDIOGRAM REPORT   Patient Name:   [name] [name] Date of Exam: 12/30/2019 Medical Rec #:  [id number]     Height:       67.0 in Accession #:    [id number]    Weight:       300.0 lb Date of Birth:  1945/11/27    BSA:          2.403 m Patient Age:    49 years      BP:           118/42 mmHg Patient Gender: F             HR:           93 bpm. Exam Location:  Inpatient Procedure: 2D Echo, Cardiac Doppler and Color Doppler Indications:    R06.02 SOB  History:        Patient has prior history of Echocardiogram examinations, most                 recent 05/09/2018. CHF, Abnormal ECG, Pacemaker and SSS, COPD,                 Signs/Symptoms:Altered Mental Status and Chest Pain; Risk                 Factors:Hypertension, Dyslipidemia and Sleep Apnea. PFO. Tacky                 brady syndrome.  Rhabdomyolysis.  Sonographer:    Roseanna Rainbow RDCS Referring Phys: [id number] [place]  1. Left ventricular ejection fraction, by estimation, is 55 to 60%. The left ventricle has normal function. The left ventricle has no regional wall motion abnormalities. Left ventricular diastolic parameters are indeterminate.  2. Right ventricular systolic function is normal. The right ventricular size is normal. There is moderately elevated pulmonary artery systolic pressure.  3. Left atrial size was moderately dilated.  4. Right atrial size was severely dilated.  5. The mitral valve is grossly normal. Mild to moderate mitral valve regurgitation.  6. Tricuspid valve regurgitation is moderate.  7. The aortic valve is tricuspid. Aortic valve regurgitation is moderate. Mild aortic valve stenosis. FINDINGS  Left Ventricle: Left ventricular ejection fraction, by estimation, is 55 to 60%. The left ventricle has normal function. The left ventricle has no regional wall motion abnormalities. The left ventricular internal cavity size was normal in size. There is  no left ventricular hypertrophy. Left ventricular diastolic parameters are indeterminate. Right Ventricle: The right ventricular size is normal. No increase in right ventricular wall thickness. Right ventricular systolic function is normal. There is moderately elevated pulmonary artery systolic pressure. The tricuspid regurgitant velocity is 2.81 m/s, and with an assumed right atrial pressure of 15 mmHg, the estimated right ventricular systolic pressure is 62.9 mmHg. Left Atrium: Left atrial size was moderately dilated. Right Atrium: Right atrial size was severely dilated. Pericardium: There is no evidence of pericardial effusion. Mitral Valve: The mitral valve is grossly normal. Mild mitral annular calcification. Mild to moderate mitral valve regurgitation. MV peak gradient, 17.6 mmHg. The mean mitral valve gradient is 5.0 mmHg. Tricuspid Valve: The tricuspid valve is  normal in structure. Tricuspid valve regurgitation is moderate. Aortic Valve: The aortic valve is tricuspid. . There is mild thickening and mild calcification of the aortic valve. Aortic valve regurgitation is  moderate. Aortic regurgitation PHT measures 466 msec. Mild aortic stenosis is present. There is mild thickening of the aortic valve. There is mild calcification of the aortic valve. Aortic valve mean gradient measures 10.0 mmHg. Aortic valve peak gradient measures 22.7 mmHg. Aortic valve area, by VTI measures 2.40 cm. Pulmonic Valve: The pulmonic valve was normal in structure. Pulmonic valve regurgitation is not visualized. No evidence of pulmonic stenosis. Aorta: The aortic root and ascending aorta are structurally normal, with no evidence of dilitation. IAS/Shunts: The atrial septum is grossly normal. Additional Comments: A pacer wire is visualized.  LEFT VENTRICLE PLAX 2D LVIDd:         4.80 cm LVIDs:         3.20 cm LV PW:         1.70 cm LV IVS:        1.10 cm LVOT diam:     2.00 cm LV SV:         88 LV SV Index:   37 LVOT Area:     3.14 cm  LV Volumes (MOD) LV vol d, MOD A2C: 65.8 ml LV vol d, MOD A4C: 97.7 ml LV vol s, MOD A2C: 29.7 ml LV vol s, MOD A4C: 25.3 ml LV SV MOD A2C:     36.1 ml LV SV MOD A4C:     97.7 ml LV SV MOD BP:      53.1 ml RIGHT VENTRICLE             IVC RV S prime:     15.30 cm/s  IVC diam: 3.50 cm TAPSE (M-mode): 2.6 cm LEFT ATRIUM              Index       RIGHT ATRIUM           Index LA diam:        4.30 cm  1.79 cm/m  RA Area:     36.80 cm LA Vol (A2C):   120.0 ml 49.95 ml/m RA Volume:   149.00 ml 62.02 ml/m LA Vol (A4C):   99.3 ml  41.33 ml/m LA Biplane Vol: 110.0 ml 45.78 ml/m  AORTIC VALVE AV Area (Vmax):    2.31 cm AV Area (Vmean):   2.20 cm AV Area (VTI):     2.40 cm AV Vmax:           238.00 cm/s AV Vmean:          147.000 cm/s AV VTI:            0.366 m AV Peak Grad:      22.7 mmHg AV Mean Grad:      10.0 mmHg LVOT Vmax:         175.00 cm/s LVOT Vmean:         103.000 cm/s LVOT VTI:          0.280 m LVOT/AV VTI ratio: 0.77 AI PHT:            466 msec  AORTA Ao Root diam: 3.00 cm Ao Asc diam:  3.70 cm MITRAL VALVE                 TRICUSPID VALVE MV Peak grad: 17.6 mmHg      TR Peak grad:   31.6 mmHg MV Mean grad: 5.0 mmHg       TR Vmax:        281.00 cm/s MV Vmax:      2.10 m/s MV Vmean:  100.2 cm/s     SHUNTS MR Peak grad:    113.6 mmHg  Systemic VTI:  0.28 m MR Mean grad:    86.0 mmHg   Systemic Diam: 2.00 cm MR Vmax:         533.00 cm/s MR Vmean:        456.0 cm/s MR PISA:         2.26 cm MR PISA Eff ROA: 14 mm MR PISA Radius:  0.60 cm Mertie Moores MD Electronically signed by Mertie Moores MD Signature Date/Time: 12/30/2019/1:45:17 PM    Final    US Abdomen Limited RUQ  Result Date: 12/27/2019 CLINICAL DATA:  Elevated LFT EXAM: ULTRASOUND ABDOMEN LIMITED RIGHT UPPER QUADRANT COMPARISON:  CT 11/01/2019 FINDINGS: Gallbladder: Sludge and small stones measuring up to 6 mm. Increased wall thickness at 4 mm. Negative sonographic Murphy. Common bile duct: Diameter: 3.2 mm Liver: Liver is slightly echogenic. Surface nodularity suspicious for cirrhosis. Portal vein is patent on color Doppler imaging with normal direction of blood flow towards the liver. Other: Ascites in the right upper quadrant IMPRESSION: 1. Sludge and stones within the gallbladder with nonspecific gallbladder wall thickening but negative sonographic Murphy. Findings could be secondary to acute or chronic cholecystitis, hepatic disease, or edema forming states. 2. Slightly echogenic liver. Suspicion of subtle contour nodularity of the liver as may be seen with cirrhosis. Small amount of ascites in the right upper quadrant. Electronically Signed   By: Donavan Foil M.D.   On: 12/21/2019 23:05    Review of Systems  Constitutional: Negative for chills, diaphoresis and fever.  HENT: Negative for ear discharge, ear pain, hearing loss and tinnitus.   Eyes: Negative for photophobia and pain.   Respiratory: Negative for cough and shortness of breath.   Cardiovascular: Negative for chest pain.  Gastrointestinal: Negative for abdominal pain, nausea and vomiting.  Genitourinary: Negative for dysuria, flank pain, frequency and urgency.  Musculoskeletal: Positive for myalgias (BLE). Negative for back pain and neck pain.  Neurological: Negative for dizziness and headaches.  Hematological: Does not bruise/bleed easily.  Psychiatric/Behavioral: The patient is not nervous/anxious.    Blood pressure (!) 124/52, pulse 89, temperature 98.3 F (36.8 C), temperature source Axillary, resp. rate 16, height 5' 7"  (1.702 m), weight 136.1 kg, SpO2 91 %. Physical Exam  Constitutional: She appears well-developed and well-nourished. No distress.  HENT:  Head: Normocephalic and atraumatic.  Eyes: Conjunctivae are normal. Right eye exhibits no discharge. Left eye exhibits no discharge. No scleral icterus.  Cardiovascular: Normal rate and regular rhythm.  Respiratory: Effort normal. No respiratory distress.  Musculoskeletal:     Cervical back: Normal range of motion.     Comments: RLE No traumatic wounds, ecchymosis, or rash  Generalized TTP, multiple wounds with the worst on plantar surface, I could not appreciate odor that was commented on by [facility] RN  No knee or ankle effusion  Knee stable to varus/ valgus and anterior/posterior stress  Sens DPN, SPN, TN intact  Motor EHL, ext, flex, evers 5/5  DP 2+, 2+ pitting edema  LLE No traumatic wounds, ecchymosis, or rash  Generalized TTP, large bulla lateral lower leg, other wounds as noted by [place] RN  No knee or ankle effusion  Knee stable to varus/ valgus and anterior/posterior stress  Sens DPN, SPN, TN intact  Motor EHL, ext, flex, evers 5/5  DP 2+, 2+ pitting edema  Neurological: She is alert.  Skin: Skin is warm and dry. She is not diaphoretic.  Psychiatric: She  has a normal mood and affect. Her behavior is normal.     Assessment/Plan: Multiple wounds -- Would continue treatment as recommended by [place] RN. I do not think the right foot wound needs debridement at this point but may during this hospitalization depending on how it declares itself. Would recommend early referral to the wound care center. Dr. Sharol Given to evaluate later today or in AM. Multiple medical problems including COPD chronically on 2 L oxygen, chronic diastolic CHF, sleep apnea, paroxysmal A. fib, chronic anemia, CKD stage III with baseline creatinine of 1.3, sick sinus syndrome status post PPM, and hypothyroidism -- per primary service    Lisette Abu, PA-C Orthopedic Surgery [phone] 12/31/2019, 8:50 AM

## 2019-12-31 NOTE — Progress Notes (Signed)
PT Cancellation Note  Patient Details Name: JARED CAHN MRN: [id number] DOB: Mar 18, 1946   Cancelled Treatment:    Reason Eval/Treat Not Completed: Medical issues which prohibited therapy - RNs in room titrating pt O2 due to O2 desats. PT to check back later in PM per RN request.  Marisa Cyphers, [place] Pager [phone]  Office [place] 12/31/2019, 12:30 PM

## 2019-12-31 NOTE — Consult Note (Signed)
[facility] Nurse Consult Note: Reason for Consult: Leg wound Patient consult received this morning.  I communicated with Dr. Posey Pronto via [place] to let him know that  Full assessment was completed last evening and the leg wounds noted and treatment POC in place. Discussed with Bedside RN, Pamala Hurry.  [facility] nursing team will not follow, but will remain available to this patient, the nursing and medical teams.  Please re-consult if needed. Thanks, Maudie Flakes, MSN, RN, [place], Arther Abbott  Pager# [phone]

## 2019-12-31 NOTE — Progress Notes (Signed)
Initial Nutrition Assessment  DOCUMENTATION CODES:   Morbid obesity  INTERVENTION:  Vital Cuisine shakes po TID, each supplement provides 500 kcal and 22 grams of protein  20m Pro-stat BID, each supplement provides 100 kcal and 15 grams protein   NUTRITION DIAGNOSIS:   Inadequate oral intake related to poor appetite as evidenced by meal completion < 50%.   GOAL:   Patient will meet greater than or equal to 90% of their needs   MONITOR:   PO intake, Supplement acceptance, Weight trends, Skin  REASON FOR ASSESSMENT:   Consult Wound healing  ASSESSMENT:   Pt with a PMH significant for COPD on 2L oxygen chronically, chronic respiratory failure, CHF, OSA, A. Fib, anemia, CKD 3, hypothyroidism, PPM implant. Pt presented s/p fall and was admitted with rhabdomyolysis, AKI on CKD stage 3, acute metabolic encephalopathy, hyperkalemia and hypernatremia.  Discussed pt with RN.   [name] Pt states that PTA, she was trying to eat healthy and reduce the amount she was eating, but states she was still eating 3 balanced meals per day. Pt endorses using protein powder at home to make smoothies. Pt does report weight loss, but states this was intentional. Pt agreeable to use of oral nutrition supplements.   PO Intake: 0-10% x 2 recorded meals  Medications reviewed and include: IV Pepcid, IV abx  Labs reviewed: Na 152 (H), CBGs [phone] UOP: 1103mx24 hours I/O: +2,705.12m22mince admit  NUTRITION - FOCUSED PHYSICAL EXAM:    Most Recent Value  Orbital Region  No depletion  Upper Arm Region  No depletion  Thoracic and Lumbar Region  No depletion  Buccal Region  No depletion  Temple Region  No depletion  Clavicle Bone Region  No depletion  Clavicle and Acromion Bone Region  No depletion  Scapular Bone Region  No depletion  Dorsal Hand  No depletion  Patellar Region  No depletion  Anterior Thigh  Region  No depletion  Posterior Calf Region  No depletion  Edema (RD Assessment)  Mild  Hair  Reviewed  Eyes  Reviewed  Mouth  Reviewed  Skin  Reviewed  Nails  Reviewed       Diet Order:   Diet Order            DIET - DYS 1 Room service appropriate? No; Fluid consistency: Nectar Thick  Diet effective now              EDUCATION NEEDS:   No education needs have been identified at this time  Skin:  Skin Assessment: Skin Integrity Issues: Skin Integrity Issues:: Other (Comment) Other: MASD Coccyx, Groin, Perineum  Last BM:  4/11  Height:   Ht Readings from Last 1 Encounters:  12/26/2019 5' 7"  (1.702 m)    Weight:   Wt Readings from Last 10 Encounters:  12/22/2019 136.1 kg  11/23/19 125.8 kg  11/06/19 126.6 kg  10/29/19 132.9 kg  10/25/19 132.9 kg  07/09/19 133 kg  05/29/19 118.4 kg  05/25/19 119.3 kg  05/10/19 124.8 kg  05/05/19 124.8 kg    BMI:  Body mass index is 46.99 kg/m.  Estimated Nutritional Needs:   Kcal:  2200-2400  Protein:  140-155 grams  Fluid:  >2 L/d    [name] [name], RD, LDN RD pager number and weekend/on-call pager number located in [place]

## 2019-12-31 NOTE — Progress Notes (Signed)
eLink Physician-Brief Progress Note Patient Name: [name] [name] DOB: 05-25-1946 MRN: [id number]   Date of Service  12/31/2019  HPI/Events of Note  Request if may stick feet for CBGs to confirm glucose levels  eICU Interventions  Ordered to "may stick feet" for CBGs     Intervention Category Intermediate Interventions: Other:  Judd Lien 12/31/2019, 12:34 AM

## 2019-12-31 NOTE — Progress Notes (Signed)
PT Cancellation Note  Patient Details Name: [name] [name] MRN: [id number] DOB: 10/14/1945   Cancelled Treatment:    Reason Eval/Treat Not Completed: Medical issues which prohibited therapy - Pt satting 84-87% at rest with tachypnea present. PT to check back tomorrow, when respiratory status is improved.   [place] Pager [phone]  Office [phone]  Roxine Caddy D Elonda Husky 12/31/2019, 3:09 PM

## 2019-12-31 NOTE — Progress Notes (Signed)
VASCULAR LAB PRELIMINARY  PRELIMINARY  PRELIMINARY  PRELIMINARY  Bilateral upper extremity venous duplex completed.    Preliminary report:  See CV proc for preliminary results.  Gissell Barra, RVT 12/31/2019, 11:22 AM

## 2019-12-31 NOTE — Progress Notes (Signed)
ANTICOAGULATION CONSULT NOTE   Pharmacy Consult for Heparin Indication: atrial fibrillation  Patient Measurements: Height: 5' 7"  (170.2 cm) Weight: 136.1 kg (300 lb) IBW/kg (Calculated) : 61.6 Heparin Dosing Weight: 94.7 kg  Vital Signs: Temp: 98.3 F (36.8 C) (04/12 0001) Temp Source: Oral (04/12 0001) BP: 124/58 (04/12 0001) Pulse Rate: 91 (04/12 0001)  Labs: Recent Labs     0000 01/06/2020 1007 12/20/2019 1007 12/30/2019 1423 12/20/2019 1751 01/15/2020 1751 12/30/19 0329 12/30/19 0816 12/30/19 1304 12/30/19 1802 12/30/19 2230 12/31/19 0043 12/31/19 0217  HGB  --  12.9   < > 14.6  --   --  12.8  --   --   --   --   --   --   HCT  --  44.9  --  43.0  --   --  45.2  --   --   --   --   --   --   PLT  --  171  --   --   --   --  142*  --   --   --   --   --   --   APTT  --   --   --   --  35   < > 29   < >   < >  --  >200* 169* 167*  HEPARINUNFRC  --   --   --   --  >2.20*  --  >2.20*  --   --   --   --   --   --   CREATININE   < > 2.48*  --   --   --   --  2.53*   < >  --  2.58* 2.40*  --  2.53*  CKTOTAL  --  2,047*  --   --   --   --  1,022*  --   --   --   --   --   --   TROPONINIHS  --  65*  --   --   --   --   --   --   --   --   --   --   --    < > = values in this interval not displayed.    Estimated Creatinine Clearance: 28.6 mL/min (A) (by C-G formula based on SCr of 2.53 mg/dL (H)).  Medications:  Scheduled:  . arformoterol  15 mcg Nebulization BID  . budesonide (PULMICORT) nebulizer solution  0.25 mg Nebulization BID  . Chlorhexidine Gluconate Cloth  6 each Topical Daily  . Gerhardt's butt cream   Topical QID  . levothyroxine  100 mcg Intravenous Daily    Assessment: 63 yof found on the floor with pressure ulcers on R side of body. Negative findings on imaging. On apixaban PTA (LD unknown) - ordered baseline labs. Pharmacy consulted to start Heparin bridge while pt is NPO.   It is noted that the patient's IV site infiltrated this AM requiring central line  placement. It is unclear whether the IV was already infiltrated prior to the AM lab check as well. Will reduce the rate slightly with restarting this afternoon and will check an aPTT level later this evening for evaluation.  4/12 AM update:  -APTT is elevated -Previously low aPTT's likely due to infiltrated peripheral IV site-now has central access -Some oozing around central line per RN-hopefully holding for an hour will help-monitor closely   Goal of Therapy:  Heparin level 0.3-0.7 units/ml  aPTT 66-102 seconds Monitor platelets by anticoagulation protocol: Yes   Plan:  -Hold heparin x 1 hr -Re-start heparin drip at 1300 units/hr -1200 heparin level/aPTT  Narda Bonds, PharmD, BCPS Clinical Pharmacist Phone: [phone]

## 2019-12-31 NOTE — Progress Notes (Signed)
[facility] Progress Note  Patient: [name] [name]    NAT:[id number]  DOA: 12/21/2019     Date of Service: the patient was seen and examined on 12/31/2019  Chief Complaint  Patient presents with  . Fall   Brief hospital course: Past medical history of COPD on 2 LPM chronically, chronic respiratory failure, chronic CHF, OSA, A. fib, anemia, CKD 3, hypothyroidism, PPM implant.  Presented with a fall that occurred on Easter Sunday.  Patient lives alone and neighbors noted mailbox was full and packages were not picked up.  Significantly confused with multiple skin breakdowns and hypoxia on arrival.  Found to have acute kidney injury, hyperkalemia, hyponatremia, rhabdomyolysis and hypoxia. Currently further plan is continue current therapy.  Assessment and Plan: 1.  Rhabdomyolysis From prolonged immobility. Initial CK 2000.  Currently CK 400.  Hold IV hydration given volume overload  2.  Acute kidney injury on chronic kidney disease stage IIIb Baseline renal function around 1.  Recently in March renal function worsened to 1.46.  Currently serum creatinine 2.4 and trending up.  Urine output 500 cc.  Ultrasound renal unremarkable for any acute obstruction.  Ultrasound abdomen also unremarkable for any acute significant abnormality.   Ultrasound renal unremarkable. IV fluid on hold. Patient was actually given IV Lasix with stabilization of renal function. IV albumin given x1 as well before Lasix.  3.  Hypernatremia.  Hyperkalemia.  Non-anion gap metabolic acidosis.  Hyperchloremia. Multiple electrolyte abnormality likely secondary to prolonged immobilization and dehydration. Currently holding IV fluids.  Will monitor response.  Potassium improving.  4.  Acute metabolic encephalopathy Acute hypercarbic respiratory acidosis Etiology still not clear likely in the setting of prolonged immobilization and AKI. We will continue to monitor closely. CT head unremarkable.  Repeat CT scan  unremarkable as well for any acute stroke.  Patient unable to get MRI secondary to pacemaker.  Metabolic work-up shows elevated TSH but normal free T4. K27 normal.  Folic acid low normal.  LFTs elevated. ABG on 12/31/2019 shows evidence of hypercarbia as well as hypoxia. BiPAP as needed.  CPAP nightly.  5.  Elevated LFT. Likely shock liver and prolonged immobilization and as well as rhabdomyolysis. Hold hepatotoxic medication.  Ultrasound liver negative for any significant abnormality.  Shows gallbladder sludge.  6.  Infiltrated IV. Limited IV access. On my evaluation patient's right upper extremity was significantly swollen and patient had pain while moving the extremity.  At extremity was also cold.  Patient does have adequate pulsation and sensation at the time of my evaluation verified with Doppler. We discontinued the IV line and had all the fluids.  Left upper extremities are still secondary to patient's breast cancer diagnosis with some mild edema there as well.  Not a candidate for PICC line placement given her AKI status as well as IV team was unable to visualize any veins on ultrasound due to his significant edema. Currently recommend elevation of the upper extremity. CCM was consulted for central line placement appreciate their assistance. We will monitor daily for need for IV line.  7.  Paroxysmal A. fib. Acute on chronic chronic diastolic CHF. Rate currently controlled. Patient is on chronic Eliquis. unable to tolerate anything p.o. Initially was on heparin.  Given that the patient does not have any evidence of DVT on upper or lower extremity Doppler currently I will be holding off on anticoagulation as she has bleeding coming out from the central insertion site.  8.  Hypothyroidism. Patient significantly encephalopathic right now. Patient has  not been able to take her Synthroid for last few days. Currently I will continue IV Synthroid.  9.  Bilateral upper and lower  extremity edema. Vascular Doppler ordered.  Currently procedure pending. Currently covered by IV heparin.  10.  Dysphagia. In the setting of multifactorial encephalopathy. CT scan x2 - for any acute abnormality. Check EEG. Metabolic work-up otherwise unremarkable other than mentioned above. Speech therapy consulted. Currently recommending n.p.o.  11. Central line placement. Post placement chest x-ray shows central line potentially in the brachiocephalic venous system. VBG confirmed venous blood return. All ports are flushing appropriately for now. Currently will be using central line for venous access.  12.  Concern for cellulitis. Wound care consulted.  Concern for right leg lower extremity cellulitis with possible necrosis. IV vancomycin, ceftriaxone and Flagyl added.  Follow-up on cultures. Orthopedic were consulted appreciate their assistance.  13. Morbid obesity OSA CPAP nightly. Body mass index is 46.99 kg/m.   Diet: Dysphagia 1 diet nectar thick liquid DVT Prophylaxis: Heparin for DVT prophylaxis  Advance goals of care discussion: Full code  Family Communication: no family was present at bedside, at the time of interview.  Discussed with daughter on the phone, Opportunity was given to ask question and all questions were answered satisfactorily.   Disposition:  Pt is from home, admitted with fall and multiorgan damage, still has acute kidney injury progressively worsening, which precludes a safe discharge. Discharge to SNF, when medically stable.  Subjective: Mentation is improving.  No nausea no vomiting.  Become short of breath later in the afternoon.  Physical Exam: General: Appear in moderate distress, no Rash; Oral Mucosa Clear, moist. no Abnormal Neck Mass Or lumps, Conjunctiva normal  Cardiovascular: S1 and S2 Present, no Murmur, Respiratory: increased respiratory effort, Bilateral Air entry present and bilateral  Crackles, no wheezes Abdomen: Bowel Sound  present, Soft and no tenderness, no hernia Extremities: bilateral  Pedal edema, bilateral  calf tenderness Neurology: alert and oriented to time, place, and person affect anxious  Vitals:   12/31/19 1400 12/31/19 1500 12/31/19 1600 12/31/19 1917  BP:   (!) 142/88 (!) 106/59  Pulse: (!) 101 98 (!) 101 99  Resp: (!) 25 19 (!) 24 (!) 26  Temp:   98.4 F (36.9 C) 98 F (36.7 C)  TempSrc:   Oral Oral  SpO2: (!) 80% (!) 88% 91% (!) 89%  Weight:      Height:        Intake/Output Summary (Last 24 hours) at 12/31/2019 1927 Last data filed at 12/31/2019 1828 Gross per 24 hour  Intake 1447.14 ml  Output 1705 ml  Net -257.86 ml   Filed Weights   12/31/2019 1046  Weight: 136.1 kg    Data Reviewed: I have personally reviewed and interpreted daily labs, tele strips, imagings as discussed above. I reviewed all nursing notes, pharmacy notes, vitals, pertinent old records I have discussed plan of care as described above with RN and patient/family.  CBC: Recent Labs  Lab 01/10/2020 1007 01/01/2020 1423 12/30/19 0329 12/31/19 0324  WBC 6.9  --  6.7 3.9*  NEUTROABS 5.5  --   --   --   HGB 12.9 14.6 12.8 9.9*  HCT 44.9 43.0 45.2 33.8*  MCV 106.4*  --  108.4* 103.7*  PLT 171  --  142* 270*   Basic Metabolic Panel: Recent Labs  Lab 12/30/19 1802 12/30/19 2230 12/31/19 0217 12/31/19 0851 12/31/19 1700  NA 146* 150* 152* 152* 151*  K 4.7 4.3 4.3  4.2 3.9  CL 113* 114* 116* 117* 116*  CO2 23 25 26 27 28   GLUCOSE 130* 137* 137* 156* 125*  BUN 73* 72* 69* 68* 69*  CREATININE 2.58* 2.40* 2.53* 2.58* 2.40*  CALCIUM 8.6* 8.5* 8.5* 8.5* 8.3*    Studies: DG CHEST PORT 1 VIEW  Result Date: 12/31/2019 CLINICAL DATA:  Acute respiratory failure with hypoxia. EXAM: PORTABLE CHEST 1 VIEW COMPARISON:  12/30/2019 FINDINGS: Stable enlargement of the cardiac silhouette. Stable position of the dual lead cardiac pacemaker. Left jugular central line is stable and the tip is pointing either anteriorly  or posteriorly and probably within a small branch of the left innominate vein. Left jugular central line placement is stable from the previous examination. Negative for pneumothorax. Prominent interstitial lung markings bilaterally. Extensive degenerative changes in both shoulders. Surgical plate in the lower cervical spine. IMPRESSION: 1. Stable cardiomegaly. 2. Prominent interstitial lung markings. Cannot exclude interstitial edema. 3. No change in the appearance or position of the left jugular central line. Suspect that the line is extending into a small branch off the left innominate vein. Electronically Signed   By: Markus Daft M.D.   On: 12/31/2019 08:13   VAS [place] LOWER EXTREMITY VENOUS (DVT)  Result Date: 12/31/2019  Lower Venous DVTStudy Indications: Edema.  Risk Factors: Rhabdomyolysis after being down in floor for a week. Limitations: Bandages and pain with touch/compression. Comparison Study: Prior RLEV done 07/06/16 is available for comparison Performing Technologist: Sharion Dove RVS  Examination Guidelines: A complete evaluation includes B-mode imaging, spectral Doppler, color Doppler, and power Doppler as needed of all accessible portions of each vessel. Bilateral testing is considered an integral part of a complete examination. Limited examinations for reoccurring indications may be performed as noted. The reflux portion of the exam is performed with the patient in reverse Trendelenburg.  +---------+---------------+---------+-----------+----------+--------------+ RIGHT    CompressibilityPhasicitySpontaneityPropertiesThrombus Aging +---------+---------------+---------+-----------+----------+--------------+ CFV      Full           Yes      Yes                                 +---------+---------------+---------+-----------+----------+--------------+ SFJ      Full                                                         +---------+---------------+---------+-----------+----------+--------------+ FV Prox                 Yes      Yes                                 +---------+---------------+---------+-----------+----------+--------------+ FV Mid                  Yes      Yes                                 +---------+---------------+---------+-----------+----------+--------------+ FV Distal               Yes      Yes                                 +---------+---------------+---------+-----------+----------+--------------+  POP                     Yes      Yes                                 +---------+---------------+---------+-----------+----------+--------------+ PTV                                                   Not visualized +---------+---------------+---------+-----------+----------+--------------+ PERO                                                  Not visualized +---------+---------------+---------+-----------+----------+--------------+   +---------+---------------+---------+-----------+----------+--------------+ LEFT     CompressibilityPhasicitySpontaneityPropertiesThrombus Aging +---------+---------------+---------+-----------+----------+--------------+ CFV      Full           Yes      Yes                                 +---------+---------------+---------+-----------+----------+--------------+ SFJ      Full                                                        +---------+---------------+---------+-----------+----------+--------------+ FV Prox                 Yes      Yes                                 +---------+---------------+---------+-----------+----------+--------------+ FV Mid                  Yes      Yes                                 +---------+---------------+---------+-----------+----------+--------------+ FV Distal               Yes      Yes                                  +---------+---------------+---------+-----------+----------+--------------+ POP      Full           Yes      Yes                                 +---------+---------------+---------+-----------+----------+--------------+ PTV                                                   Not visualized +---------+---------------+---------+-----------+----------+--------------+ PERO  Not visualized +---------+---------------+---------+-----------+----------+--------------+     Summary: RIGHT: - Findings appear essentially unchanged compared to previous examination. - There is no evidence of deep vein thrombosis in the lower extremity. However, portions of this examination were limited- see technologist comments above.  LEFT: - There is no evidence of deep vein thrombosis in the lower extremity. However, portions of this examination were limited- see technologist comments above.  *See table(s) above for measurements and observations.    Preliminary    VAS [place] UPPER EXTREMITY VENOUS DUPLEX  Result Date: 12/31/2019 UPPER VENOUS STUDY  Indications: Edema Risk Factors: Rhabdomyolysis after being down in floor for 1 week. Anticoagulation: Heparin. Limitations: Central line and bandage in left neck and body habitus. Comparison Study: Prior LUE venous duplex done 11/04/19 is available for                   comparison Performing Technologist: Sharion Dove RVS  Examination Guidelines: A complete evaluation includes B-mode imaging, spectral Doppler, color Doppler, and power Doppler as needed of all accessible portions of each vessel. Bilateral testing is considered an integral part of a complete examination. Limited examinations for reoccurring indications may be performed as noted.  Right Findings: +----------+------------+---------+-----------+----------+--------------+ RIGHT     CompressiblePhasicitySpontaneousProperties   Summary      +----------+------------+---------+-----------+----------+--------------+ IJV           Full                                                 +----------+------------+---------+-----------+----------+--------------+ Subclavian               Yes       Yes                             +----------+------------+---------+-----------+----------+--------------+ Axillary                                            Not visualized +----------+------------+---------+-----------+----------+--------------+ Brachial      Full                                                 +----------+------------+---------+-----------+----------+--------------+ Cephalic                                            Not visualized +----------+------------+---------+-----------+----------+--------------+ Basilic                                             Not visualized +----------+------------+---------+-----------+----------+--------------+  Left Findings: +----------+------------+---------+-----------+----------+--------------+ LEFT      CompressiblePhasicitySpontaneousProperties   Summary     +----------+------------+---------+-----------+----------+--------------+ IJV  Not visualized +----------+------------+---------+-----------+----------+--------------+ Subclavian               Yes       Yes                             +----------+------------+---------+-----------+----------+--------------+ Axillary                 Yes       Yes                             +----------+------------+---------+-----------+----------+--------------+ Brachial      Full       Yes       Yes                             +----------+------------+---------+-----------+----------+--------------+ Cephalic                                            Not visualized +----------+------------+---------+-----------+----------+--------------+ Basilic                                              Not visualized +----------+------------+---------+-----------+----------+--------------+  Summary:  Right: No evidence of deep vein thrombosis in the upper extremity. However, unable to visualize the axillary, cephalic, and basilic veins.  Left: No evidence of deep vein thrombosis in the upper extremity. However, unable to visualize the cephalic, basilic, and internal jugular veins.  *See table(s) above for measurements and observations.    Preliminary     Scheduled Meds: . arformoterol  15 mcg Nebulization BID  . budesonide (PULMICORT) nebulizer solution  0.25 mg Nebulization BID  . Chlorhexidine Gluconate Cloth  6 each Topical Daily  . feeding supplement (PRO-STAT SUGAR FREE 64)  30 mL Oral BID  . Gerhardt's butt cream   Topical QID  . heparin injection (subcutaneous)  5,000 Units Subcutaneous Q8H  . levothyroxine  100 mcg Intravenous Daily  . sodium chloride flush  10-40 mL Intracatheter Q12H   Continuous Infusions: . cefTRIAXone (ROCEPHIN)  IV 2 g (12/31/19 1706)  . famotidine (PEPCID) IV 20 mg (12/31/19 1254)  . metronidazole 500 mg (12/31/19 1545)  . [START ON 01/02/2020] vancomycin     PRN Meds: acetaminophen **OR** acetaminophen, albuterol, dextrose, ondansetron (ZOFRAN) IV, oxyCODONE, Resource ThickenUp Clear  Time spent: 35 minutes  Author: Berle Mull, MD [facility] 12/31/2019 7:27 PM  To reach On-call, see care teams to locate the attending and reach out to them via [email or web address]. If 7PM-7AM, please contact night-coverage If you still have difficulty reaching the attending provider, please page the [facility] (Director on Call) for [facility] on amion for assistance.

## 2019-12-31 NOTE — Significant Event (Addendum)
Rapid Response Event Note  Overview: Time Called: 1204 Arrival Time: 1210 Event Type: Respiratory Acute oxygen desaturation while pt was resting in bed.   Initial Focused Assessment: Pt lying in bed, resting. Eyes open to voice. Pt is alert to person, place, year, and situation. Pt is able to follow commands. Lung sounds are clear. Pt does not appear to be in distress. Pt is warm dry to touch. Pt was on 4LNC when RN first noticed her desaturation. SpO2 as low as 72%. RN attempted 100% NRB, but oxygen saturation did not improve. Pt has history of sleep apnea. Pt denies pain or discomfort, says she feels good. PERRLA, 67m.   VS: BP 120/63 (79), HR 100, RR 20, SpO2 88% on 12L HFNC CBG: 117  Oxygen saturation teetering between 88-93% on 12L HFNC. Pt is drowsy, but easily awakens to voice. Oxygen saturation dips down to 88% while asleep and quickly recovers to 93% once awake.   Interventions: No intervention from rapid response RN  Orders received from MD: -Duoneb -ABG  Respiratory therapist made aware of new orders.   Plan of Care (if not transferred): -Wean supplemental oxygen as tolerated  Event Summary: Name of Physician Notified: Dr. PPosey Prontoat 1230 Outcome: Stayed in room and stabalized Event End Time: 1[place]

## 2019-12-31 NOTE — Progress Notes (Signed)
Pt has gotten ceftriaxone multiple times in the past. D/w Dr. Posey Pronto and we will change her aztreonam to ceftriaxone.   Onnie Boer, PharmD, BCIDP, AAHIVP, CPP Infectious Disease Pharmacist 12/31/2019 12:50 PM

## 2019-12-31 NOTE — Progress Notes (Signed)
VASCULAR LAB PRELIMINARY  PRELIMINARY  PRELIMINARY  PRELIMINARY  Bilateral lower extremity venous duplex completed.    Preliminary report:  See CV proc for preliminary results.   Zanasia Hickson, RVT 12/31/2019, 11:37 AM

## 2019-12-31 NOTE — Progress Notes (Signed)
SLP Cancellation Note  Patient Details Name: [name] [name] MRN: [id number] DOB: 05-11-1946   Cancelled treatment:       Reason Eval/Treat Not Completed: Patient at procedure or test/unavailable.  Attempted to see pt, but NT was cleaning her at this time.  SLP will f/u as schedule allows.    [name] [name] 12/31/2019, 10:00 AM

## 2019-12-31 NOTE — Progress Notes (Signed)
Patient summoned by PM RN to assess this patient. The Rn had placed the patient on NIV due to SpO2 in the 80s. I refitted the mask properly increased pressure from 12/5 to 16/8 and 100% FiO2 to maintain an SpO2 >90%.  Current SpO2 ebbs from 91%-96%. The patient has no increased WOB and BBS are clear to auscultation.  Rapid response nurse notified of changes and will assess this patient as she is able to.

## 2019-12-31 NOTE — Significant Event (Addendum)
Rapid Response Event Note  Overview:Called by RT d/t pt SpO2-89-91% on 100% bipap. Per RT, pt in no distress, lungs diminished. Pt just requiring maxed FiO2 on bipap. Initial Focused Assessment: Pt laying in bed, alert, intermittently trying to pull off bipap. Pt does not look in distress, however pt does c/o SOB. Lungs diminished t/o. BUE cool to touch, BLE warm. T-98, HR-95, BP-125/80, RR-19, SpO2-87-93% on bipap 100%.  Interventions: Continue bipap Lasix 81m x 1  Plan of Care (if not transferred): Give lasix and monitor response. Continue to monitor pt closely. Call RRT if pt worsens or if further assistance needed.  Event Summary: Notified Dr. OMyna Hidalgoat 2053   Called:2015 Arrived:2035 ELKZ:[id number] [place]

## 2019-12-31 NOTE — Consult Note (Signed)
PV Navigator consult acknowledged and chart reviewed. Thank you for this opportunity to see Ms Lightcap.  PV Navigator able to visit with patient at bedside and introduce myself and my role. Left patient my contact information with business card.   Patient fell at home several days ago. She lives alone and was discovered by her neighbor who called EMS when she noticed Ms Esther packages were piling up on her porch. She was down several days before she was discovered and has sustained several pressure injuries due to this. She is not a smoker. Denies diabetes. PMH significant for morbid obesity, coronary atherosclerosis, HTN, COPD, HTN and pacemaker.    Ms Odaniel is alert to person and place. She is extremely weak, requiring long breaks between words or sentences. She repeats some things over and over. She says she just "needs a power chair to get around" when she goes back home. She says she lives alone and relies on her neighbors help as needed. Patient says her daughter helps her with transportation, however she does not like to use her because "she uses the transportation to manipulate me and get what she wants." Ms Dwight does not want her daughter contacted at this time.   Noted multiple dressings intact to R forehead, B elbows, RLE and R plantar foot. I have read [place] consult note. I did pull back dressing to R plantar foot to visualize full thickness wound with flap of skin that remains attached to one side. Slight foul odor detected. Small amount of sanguineous drainage noted on dressing. Dressing re-attached and awaiting Dr Jess Barters assessment.  BLE warm to touch, + pedal pulses palpated with some wrinkling noted in surrounding tissue with elevation. Prevalon boots in place bilaterally.  Patient had venous study this am to R/O DVT.   Will follow pending PT/OT, ortho, nutrition and TOC evaluations and suggestions for disposition, to see what needs patient may require at time of discharge. Please reach out  to me if assistance needed setting up outpatient wound care with wound clinic.   Thank you, Cletis Media RN BSN Elma Watson [phone]

## 2019-12-31 NOTE — Progress Notes (Signed)
Pharmacy Antibiotic Note  [name] [name] is a 74 y.o. female with possible diabetic foot wound.  Pharmacy has been consulted for Vancomycin and Aztreonam dosing.  Plan: Vancomycin 2 g IV now, then 1250 mg IV q48h Est AUC 451 Aztreonam 1 g IV q8h  Height: 5' 7"  (170.2 cm) Weight: 136.1 kg (300 lb) IBW/kg (Calculated) : 61.6  Temp (24hrs), Avg:98.1 F (36.7 C), Min:97.8 F (36.6 C), Max:98.3 F (36.8 C)  Recent Labs  Lab 01/01/2020 1007 01/16/2020 1007 12/30/19 0329 12/30/19 0329 12/30/19 0816 12/30/19 0816 12/30/19 1043 12/30/19 1304 12/30/19 1802 12/30/19 2230 12/31/19 0217 12/31/19 0324  WBC 6.9  --  6.7  --   --   --   --   --   --   --   --  3.9*  CREATININE 2.48*   < > 2.53*   < > 2.57*   < > 2.58* 2.61* 2.58* 2.40* 2.53*  --   LATICACIDVEN  --   --   --   --  3.1*  --  3.1*  --   --   --   --   --    < > = values in this interval not displayed.    Estimated Creatinine Clearance: 28.6 mL/min (A) (by C-G formula based on SCr of 2.53 mg/dL (H)).    Allergies  Allergen Reactions  . Cephalexin Other (See Comments)    Exfoliative dermatitis reaction  . Ciprofloxacin Other (See Comments)    Folliculitis   . Lisinopril Cough  . Codeine     REACTION: unknown - pt. states unaware of having reaction to codeine  . Latex Dermatitis  . Tape Dermatitis  . Cleocin [Clindamycin Hcl] Rash  . Dilaudid [Hydromorphone] Other (See Comments)    Oversedation  . Doxycycline Rash  . Penicillins Rash    Did it involve swelling of the face/tongue/throat, SOB, or low BP? No Did it involve sudden or severe rash/hives, skin peeling, or any reaction on the inside of your mouth or nose? Yes Did you need to seek medical attention at a hospital or doctor's office?Yes When did it last happen?several years ago If all above answers are "NO", may proceed with cephalosporin use.     [name] [name] 12/31/2019 7:52 AM

## 2019-12-31 NOTE — Progress Notes (Signed)
PROGRESS NOTE    [name] [name]  OJJ:[id number] DOB: 20-Apr-1946 DOA: 12/28/2019 PCP: Biagio Borg, MD   Brief Narrative:  Past medical history of COPD on 2 LPM chronically, chronic respiratory failure, chronic CHF, OSA, A. fib, anemia, CKD 3, hypothyroidism, PPM implant.  Presented with a fall that occurred on Easter Sunday.  Patient lives alone and neighbors noted mailbox was full and packages were not picked up.  Significantly confused with multiple skin breakdowns and hypoxia on arrival.  Found to have acute kidney injury, hyperkalemia, hyponatremia, rhabdomyolysis and hypoxia.   Called overnight regarding increasing supplemental O2 requirements, now on BiPAP with 100% FiO2.   Assessment & Plan:   1. Acute on chronic hypoxic and hypercarbic respiratory failure  - Patient reports chronic 2 Lpm supplemental O2 requirement, requiring 5 Lpm yesterday, then saturating 80s this afternoon on HFNC, had acute respiratory acidosis on ABG, suspected edema on CXR, was given Lasix 40 mg IV and placed on BiPAP  - Patient now saturating 88-94% on BiPAP with 100% FiO2, RR 18-20, diminished breath sounds, appears hypervolemic  - Does not appear to have PNA and is on broad-spectrum abx; upper and lower extremity DVT studies were negative today; denies chest pain  - Plan to give additional Lasix 60 mg IV now, albuterol neb, continue BiPAP, call PCCM if worsens despite this or fails to improve    Subjective: History limited due to patient on BiPAP, denies chest pain, nods "yes" to SOB.   Objective: Vitals:   12/31/19 1600 12/31/19 1917 12/31/19 2004 12/31/19 2017  BP: (!) 142/88 (!) 106/59 125/80   Pulse: (!) 101 99 (!) 102 99  Resp: (!) 24 (!) 26 (!) 22 19  Temp: 98.4 F (36.9 C) 98 F (36.7 C)    TempSrc: Oral Oral    SpO2: 91% (!) 89% 95% 97%  Weight:      Height:        Intake/Output Summary (Last 24 hours) at 12/31/2019 2131 Last data filed at 12/31/2019 1828 Gross per 24 hour  Intake  1095.2 ml  Output 1230 ml  Net -134.8 ml   Filed Weights   12/31/2019 1046  Weight: 136.1 kg    Examination:  General exam: Appears fatigued, edematous  Respiratory system: Diminished breath sounds, RR 18-20, no pallor or cyanosis. . Cardiovascular system: S1 & S2 heard. Upper and lower extremity swelling. Gastrointestinal system: Abdomen is nondistended, soft and nontender. Normal bowel sounds heard. Central nervous system: Somnolent, wakes to voice. Makes eye contact, on BiPAP but answers questions with head nod/shake. Moving all extremities. Extremities: No gross deformity. No acrocyanosis.  Skin: Scattered areas of skin breakdown  Psychiatry: Difficult to assess d/t being on BiPAP.     Data Reviewed: I have personally reviewed following labs and imaging studies  CBC: Recent Labs  Lab 01/03/2020 1007 01/03/2020 1423 12/30/19 0329 12/31/19 0324  WBC 6.9  --  6.7 3.9*  NEUTROABS 5.5  --   --   --   HGB 12.9 14.6 12.8 9.9*  HCT 44.9 43.0 45.2 33.8*  MCV 106.4*  --  108.4* 103.7*  PLT 171  --  142* 937*   Basic Metabolic Panel: Recent Labs  Lab 12/30/19 1802 12/30/19 2230 12/31/19 0217 12/31/19 0851 12/31/19 1700  NA 146* 150* 152* 152* 151*  K 4.7 4.3 4.3 4.2 3.9  CL 113* 114* 116* 117* 116*  CO2 23 25 26 27 28   GLUCOSE 130* 137* 137* 156* 125*  BUN 73* 72*  69* 68* 69*  CREATININE 2.58* 2.40* 2.53* 2.58* 2.40*  CALCIUM 8.6* 8.5* 8.5* 8.5* 8.3*   GFR: Estimated Creatinine Clearance: 30.1 mL/min (A) (by C-G formula based on SCr of 2.4 mg/dL (H)). Liver Function Tests: Recent Labs  Lab 01/11/2020 1007 12/30/19 0329 12/31/19 0851  AST 115* 86* 52*  ALT 55* 49* 40  ALKPHOS 155* 167* 124  BILITOT 2.9* 2.7* 2.3*  PROT 6.3* 6.8 6.1*  ALBUMIN 3.3* 3.4* 2.8*   Recent Labs  Lab 12/24/2019 1007  LIPASE 19   Recent Labs  Lab 12/30/19 0816  AMMONIA 52*   Coagulation Profile: No results for input(s): INR, PROTIME in the last 168 hours. Cardiac Enzymes: Recent  Labs  Lab 01/02/2020 1007 12/30/19 0329 12/31/19 0851  CKTOTAL 2,047* 1,022* 430*   BNP (last 3 results) No results for input(s): PROBNP in the last 8760 hours. HbA1C: No results for input(s): HGBA1C in the last 72 hours. CBG: Recent Labs  Lab 12/31/19 0415 12/31/19 0908 12/31/19 1211 12/31/19 1553 12/31/19 1915  GLUCAP 273* 144* 117* 105* 119*   Lipid Profile: No results for input(s): CHOL, HDL, LDLCALC, TRIG, CHOLHDL, LDLDIRECT in the last 72 hours. Thyroid Function Tests: Recent Labs    01/12/2020 1418 12/30/19 0816  TSH 6.633*  --   FREET4  --  0.92   Anemia Panel: Recent Labs    12/30/19 0816  VITAMINB12 5,768*  FOLATE 9.1   Sepsis Labs: Recent Labs  Lab 12/30/19 0329 12/30/19 0816 12/30/19 1043 12/31/19 0217  PROCALCITON 0.85  --   --  0.69  LATICACIDVEN  --  3.1* 3.1*  --     Recent Results (from the past 240 hour(s))  SARS CORONAVIRUS 2 (TAT 6-24 HRS) Nasopharyngeal Urine, Clean Catch     Status: None   Collection Time: 12/27/2019 12:08 PM   Specimen: Urine, Clean Catch; Nasopharyngeal  Result Value Ref Range Status   SARS Coronavirus 2 NEGATIVE NEGATIVE Final    Comment: (NOTE) SARS-CoV-2 target nucleic acids are NOT DETECTED. The SARS-CoV-2 RNA is generally detectable in upper and lower respiratory specimens during the acute phase of infection. Negative results do not preclude SARS-CoV-2 infection, do not rule out co-infections with other pathogens, and should not be used as the sole basis for treatment or other patient management decisions. Negative results must be combined with clinical observations, patient history, and epidemiological information. The expected result is Negative. Fact Sheet for Patients: SugarRoll.be Fact Sheet for Healthcare Providers: https://www.woods-mathews.com/ This test is not yet approved or cleared by the [place] FDA and  has been authorized for detection and/or  diagnosis of SARS-CoV-2 by FDA under an Emergency Use Authorization (EUA). This EUA will remain  in effect (meaning this test can be used) for the duration of the COVID-19 declaration under Section 56 4(b)(1) of the Act, 21 U.S.C. section 360bbb-3(b)(1), unless the authorization is terminated or revoked sooner. Performed at [facility], [place] [address], [place], [place] [address]   Urine culture     Status: None   Collection Time: 01/11/2020  1:09 PM   Specimen: Urine, Random  Result Value Ref Range Status   Specimen Description URINE, RANDOM  Final   Special Requests NONE  Final   Culture   Final    NO GROWTH Performed at [facility], [place] [address], [place], [place] [address]    Report Status 12/30/2019 FINAL  Final         Radiology Studies: DG Chest 1 View  Result Date: 12/30/2019 CLINICAL DATA:  Central line placement EXAM: CHEST  1 VIEW COMPARISON:  Chest x-ray 12/30/2019 at 10:19 a.m. FINDINGS: Dual lead pacer device remains in place. Left-sided central venous catheter takes an unusual course, extending along the left upper chest, passing beyond the expected boundary of the left brachiocephalic vein and extending along the left lateral margin of the thoracic aorta in the superior mediastinum. Heart size remains enlarged. No signs of large effusion on the current study. No pneumothorax on semi erect view. Lungs are clear. Visualized skeletal structures without acute finding. Cervical spinal fusion as before. IMPRESSION: Abnormal course of left sided central venous line, this may be within a tributary of the brachiocephalic venous access system, sub primum intercostal vein or hemi azygous branch, potentially also with arterial placement based on position. Extra-venous extension into the mediastinum or pleural space is also considered. Chest CT may be helpful to determine next steps for management. Cardiomegaly with dual lead pacer device. Critical Value/emergent results  were called by telephone at the time of interpretation on 12/30/2019 at 11:57 a.m. to provider NP Noe Gens, who verbally acknowledged these results. Electronically Signed   By: Zetta Bills M.D.   On: 12/30/2019 12:15   DG Elbow 2 Views Right  Result Date: 12/30/2019 CLINICAL DATA:  Possible pressure ulcer on right elbow. EXAM: RIGHT ELBOW - 2 VIEW COMPARISON:  None. FINDINGS: Bandaging is seen over the posterior right elbow. No fracture or joint effusion identified. No bony erosion. No foreign body noted. IMPRESSION: Soft tissue edema. No fracture, erosion, foreign body, or bony ulceration. Electronically Signed   By: Dorise Bullion III M.D   On: 12/30/2019 12:30   CT HEAD WO CONTRAST  Result Date: 12/30/2019 CLINICAL DATA:  74 year old female with encephalopathy. Recent fall. EXAM: CT HEAD WITHOUT CONTRAST TECHNIQUE: Contiguous axial images were obtained from the base of the skull through the vertex without intravenous contrast. COMPARISON:  Head and cervical spine CT yesterday. Head CT 11/01/2019 and earlier. FINDINGS: Brain: Gray-white matter differentiation appears stable and within normal limits for age. No midline shift, ventriculomegaly, mass effect, evidence of mass lesion, intracranial hemorrhage or evidence of cortically based acute infarction. There are multiple scattered small punctate calcifications in the subarachnoid spaces, mostly in the left hemisphere. This is stable and probably postinflammatory, rather than due to distal vessel calcified atherosclerosis. Vascular: Calcified atherosclerosis at the skull base. No suspicious intracranial vascular hyperdensity. Skull: No acute osseous abnormality identified. Sinuses/Orbits: Visualized paranasal sinuses and mastoids are stable and well pneumatized. Other: Broad-based right side scalp hematoma appears stable. No scalp soft tissue gas. Orbits soft tissues appear intact. IMPRESSION: 1. Stable and negative for age non contrast CT appearance  of the brain. 2. Broad-based right scalp hematoma. Electronically Signed   By: Genevie Ann M.D.   On: 12/30/2019 17:05   DG CHEST PORT 1 VIEW  Result Date: 12/31/2019 CLINICAL DATA:  Acute respiratory failure with hypoxia. EXAM: PORTABLE CHEST 1 VIEW COMPARISON:  12/30/2019 FINDINGS: Stable enlargement of the cardiac silhouette. Stable position of the dual lead cardiac pacemaker. Left jugular central line is stable and the tip is pointing either anteriorly or posteriorly and probably within a small branch of the left innominate vein. Left jugular central line placement is stable from the previous examination. Negative for pneumothorax. Prominent interstitial lung markings bilaterally. Extensive degenerative changes in both shoulders. Surgical plate in the lower cervical spine. IMPRESSION: 1. Stable cardiomegaly. 2. Prominent interstitial lung markings. Cannot exclude interstitial edema. 3. No  change in the appearance or position of the left jugular central line. Suspect that the line is extending into a small branch off the left innominate vein. Electronically Signed   By: Markus Daft M.D.   On: 12/31/2019 08:13   DG CHEST PORT 1 VIEW  Result Date: 12/30/2019 CLINICAL DATA:  Shortness of breath EXAM: PORTABLE CHEST 1 VIEW COMPARISON:  December 29, 2019 FINDINGS: Stable cardiomegaly. A pacemaker is noted. The hila and mediastinum are normal. No pneumothorax. No nodules or masses. No focal infiltrates or overt edema. IMPRESSION: No active disease. Electronically Signed   By: Dorise Bullion III M.D   On: 12/30/2019 12:31   ECHOCARDIOGRAM COMPLETE  Result Date: 12/30/2019    ECHOCARDIOGRAM REPORT   Patient Name:   [name] [name] Date of Exam: 12/30/2019 Medical Rec #:  [id number]     Height:       67.0 in Accession #:    [id number]    Weight:       300.0 lb Date of Birth:  Oct 15, 1945    BSA:          2.403 m Patient Age:    65 years      BP:           118/42 mmHg Patient Gender: F             HR:           93 bpm.  Exam Location:  Inpatient Procedure: 2D Echo, Cardiac Doppler and Color Doppler Indications:    R06.02 SOB  History:        Patient has prior history of Echocardiogram examinations, most                 recent 05/09/2018. CHF, Abnormal ECG, Pacemaker and SSS, COPD,                 Signs/Symptoms:Altered Mental Status and Chest Pain; Risk                 Factors:Hypertension, Dyslipidemia and Sleep Apnea. PFO. Tacky                 brady syndrome. Rhabdomyolysis.  Sonographer:    Roseanna Rainbow RDCS Referring Phys: [id number] [place]  1. Left ventricular ejection fraction, by estimation, is 55 to 60%. The left ventricle has normal function. The left ventricle has no regional wall motion abnormalities. Left ventricular diastolic parameters are indeterminate.  2. Right ventricular systolic function is normal. The right ventricular size is normal. There is moderately elevated pulmonary artery systolic pressure.  3. Left atrial size was moderately dilated.  4. Right atrial size was severely dilated.  5. The mitral valve is grossly normal. Mild to moderate mitral valve regurgitation.  6. Tricuspid valve regurgitation is moderate.  7. The aortic valve is tricuspid. Aortic valve regurgitation is moderate. Mild aortic valve stenosis. FINDINGS  Left Ventricle: Left ventricular ejection fraction, by estimation, is 55 to 60%. The left ventricle has normal function. The left ventricle has no regional wall motion abnormalities. The left ventricular internal cavity size was normal in size. There is  no left ventricular hypertrophy. Left ventricular diastolic parameters are indeterminate. Right Ventricle: The right ventricular size is normal. No increase in right ventricular wall thickness. Right ventricular systolic function is normal. There is moderately elevated pulmonary artery systolic pressure. The tricuspid regurgitant velocity is 2.81 m/s, and with an assumed right atrial pressure of 15 mmHg, the estimated right  ventricular systolic  pressure is 46.6 mmHg. Left Atrium: Left atrial size was moderately dilated. Right Atrium: Right atrial size was severely dilated. Pericardium: There is no evidence of pericardial effusion. Mitral Valve: The mitral valve is grossly normal. Mild mitral annular calcification. Mild to moderate mitral valve regurgitation. MV peak gradient, 17.6 mmHg. The mean mitral valve gradient is 5.0 mmHg. Tricuspid Valve: The tricuspid valve is normal in structure. Tricuspid valve regurgitation is moderate. Aortic Valve: The aortic valve is tricuspid. . There is mild thickening and mild calcification of the aortic valve. Aortic valve regurgitation is moderate. Aortic regurgitation PHT measures 466 msec. Mild aortic stenosis is present. There is mild thickening of the aortic valve. There is mild calcification of the aortic valve. Aortic valve mean gradient measures 10.0 mmHg. Aortic valve peak gradient measures 22.7 mmHg. Aortic valve area, by VTI measures 2.40 cm. Pulmonic Valve: The pulmonic valve was normal in structure. Pulmonic valve regurgitation is not visualized. No evidence of pulmonic stenosis. Aorta: The aortic root and ascending aorta are structurally normal, with no evidence of dilitation. IAS/Shunts: The atrial septum is grossly normal. Additional Comments: A pacer wire is visualized.  LEFT VENTRICLE PLAX 2D LVIDd:         4.80 cm LVIDs:         3.20 cm LV PW:         1.70 cm LV IVS:        1.10 cm LVOT diam:     2.00 cm LV SV:         88 LV SV Index:   37 LVOT Area:     3.14 cm  LV Volumes (MOD) LV vol d, MOD A2C: 65.8 ml LV vol d, MOD A4C: 97.7 ml LV vol s, MOD A2C: 29.7 ml LV vol s, MOD A4C: 25.3 ml LV SV MOD A2C:     36.1 ml LV SV MOD A4C:     97.7 ml LV SV MOD BP:      53.1 ml RIGHT VENTRICLE             IVC RV S prime:     15.30 cm/s  IVC diam: 3.50 cm TAPSE (M-mode): 2.6 cm LEFT ATRIUM              Index       RIGHT ATRIUM           Index LA diam:        4.30 cm  1.79 cm/m  RA Area:      36.80 cm LA Vol (A2C):   120.0 ml 49.95 ml/m RA Volume:   149.00 ml 62.02 ml/m LA Vol (A4C):   99.3 ml  41.33 ml/m LA Biplane Vol: 110.0 ml 45.78 ml/m  AORTIC VALVE AV Area (Vmax):    2.31 cm AV Area (Vmean):   2.20 cm AV Area (VTI):     2.40 cm AV Vmax:           238.00 cm/s AV Vmean:          147.000 cm/s AV VTI:            0.366 m AV Peak Grad:      22.7 mmHg AV Mean Grad:      10.0 mmHg LVOT Vmax:         175.00 cm/s LVOT Vmean:        103.000 cm/s LVOT VTI:          0.280 m LVOT/AV VTI ratio: 0.77 AI PHT:  466 msec  AORTA Ao Root diam: 3.00 cm Ao Asc diam:  3.70 cm MITRAL VALVE                 TRICUSPID VALVE MV Peak grad: 17.6 mmHg      TR Peak grad:   31.6 mmHg MV Mean grad: 5.0 mmHg       TR Vmax:        281.00 cm/s MV Vmax:      2.10 m/s MV Vmean:     100.2 cm/s     SHUNTS MR Peak grad:    113.6 mmHg  Systemic VTI:  0.28 m MR Mean grad:    86.0 mmHg   Systemic Diam: 2.00 cm MR Vmax:         533.00 cm/s MR Vmean:        456.0 cm/s MR PISA:         2.26 cm MR PISA Eff ROA: 14 mm MR PISA Radius:  0.60 cm Mertie Moores MD Electronically signed by Mertie Moores MD Signature Date/Time: 12/30/2019/1:45:17 PM    Final    VAS [place] LOWER EXTREMITY VENOUS (DVT)  Result Date: 12/31/2019  Lower Venous DVTStudy Indications: Edema.  Risk Factors: Rhabdomyolysis after being down in floor for a week. Limitations: Bandages and pain with touch/compression. Comparison Study: Prior RLEV done 07/06/16 is available for comparison Performing Technologist: Sharion Dove RVS  Examination Guidelines: A complete evaluation includes B-mode imaging, spectral Doppler, color Doppler, and power Doppler as needed of all accessible portions of each vessel. Bilateral testing is considered an integral part of a complete examination. Limited examinations for reoccurring indications may be performed as noted. The reflux portion of the exam is performed with the patient in reverse Trendelenburg.   +---------+---------------+---------+-----------+----------+--------------+ RIGHT    CompressibilityPhasicitySpontaneityPropertiesThrombus Aging +---------+---------------+---------+-----------+----------+--------------+ CFV      Full           Yes      Yes                                 +---------+---------------+---------+-----------+----------+--------------+ SFJ      Full                                                        +---------+---------------+---------+-----------+----------+--------------+ FV Prox                 Yes      Yes                                 +---------+---------------+---------+-----------+----------+--------------+ FV Mid                  Yes      Yes                                 +---------+---------------+---------+-----------+----------+--------------+ FV Distal               Yes      Yes                                 +---------+---------------+---------+-----------+----------+--------------+  POP                     Yes      Yes                                 +---------+---------------+---------+-----------+----------+--------------+ PTV                                                   Not visualized +---------+---------------+---------+-----------+----------+--------------+ PERO                                                  Not visualized +---------+---------------+---------+-----------+----------+--------------+   +---------+---------------+---------+-----------+----------+--------------+ LEFT     CompressibilityPhasicitySpontaneityPropertiesThrombus Aging +---------+---------------+---------+-----------+----------+--------------+ CFV      Full           Yes      Yes                                 +---------+---------------+---------+-----------+----------+--------------+ SFJ      Full                                                         +---------+---------------+---------+-----------+----------+--------------+ FV Prox                 Yes      Yes                                 +---------+---------------+---------+-----------+----------+--------------+ FV Mid                  Yes      Yes                                 +---------+---------------+---------+-----------+----------+--------------+ FV Distal               Yes      Yes                                 +---------+---------------+---------+-----------+----------+--------------+ POP      Full           Yes      Yes                                 +---------+---------------+---------+-----------+----------+--------------+ PTV                                                   Not visualized +---------+---------------+---------+-----------+----------+--------------+ PERO  Not visualized +---------+---------------+---------+-----------+----------+--------------+     Summary: RIGHT: - Findings appear essentially unchanged compared to previous examination. - There is no evidence of deep vein thrombosis in the lower extremity. However, portions of this examination were limited- see technologist comments above.  LEFT: - There is no evidence of deep vein thrombosis in the lower extremity. However, portions of this examination were limited- see technologist comments above.  *See table(s) above for measurements and observations.    Preliminary    VAS [place] UPPER EXTREMITY VENOUS DUPLEX  Result Date: 12/31/2019 UPPER VENOUS STUDY  Indications: Edema Risk Factors: Rhabdomyolysis after being down in floor for 1 week. Anticoagulation: Heparin. Limitations: Central line and bandage in left neck and body habitus. Comparison Study: Prior LUE venous duplex done 11/04/19 is available for                   comparison Performing Technologist: Sharion Dove RVS  Examination Guidelines: A complete evaluation includes B-mode  imaging, spectral Doppler, color Doppler, and power Doppler as needed of all accessible portions of each vessel. Bilateral testing is considered an integral part of a complete examination. Limited examinations for reoccurring indications may be performed as noted.  Right Findings: +----------+------------+---------+-----------+----------+--------------+ RIGHT     CompressiblePhasicitySpontaneousProperties   Summary     +----------+------------+---------+-----------+----------+--------------+ IJV           Full                                                 +----------+------------+---------+-----------+----------+--------------+ Subclavian               Yes       Yes                             +----------+------------+---------+-----------+----------+--------------+ Axillary                                            Not visualized +----------+------------+---------+-----------+----------+--------------+ Brachial      Full                                                 +----------+------------+---------+-----------+----------+--------------+ Cephalic                                            Not visualized +----------+------------+---------+-----------+----------+--------------+ Basilic                                             Not visualized +----------+------------+---------+-----------+----------+--------------+  Left Findings: +----------+------------+---------+-----------+----------+--------------+ LEFT      CompressiblePhasicitySpontaneousProperties   Summary     +----------+------------+---------+-----------+----------+--------------+ IJV  Not visualized +----------+------------+---------+-----------+----------+--------------+ Subclavian               Yes       Yes                             +----------+------------+---------+-----------+----------+--------------+ Axillary                  Yes       Yes                             +----------+------------+---------+-----------+----------+--------------+ Brachial      Full       Yes       Yes                             +----------+------------+---------+-----------+----------+--------------+ Cephalic                                            Not visualized +----------+------------+---------+-----------+----------+--------------+ Basilic                                             Not visualized +----------+------------+---------+-----------+----------+--------------+  Summary:  Right: No evidence of deep vein thrombosis in the upper extremity. However, unable to visualize the axillary, cephalic, and basilic veins.  Left: No evidence of deep vein thrombosis in the upper extremity. However, unable to visualize the cephalic, basilic, and internal jugular veins.  *See table(s) above for measurements and observations.    Preliminary         Scheduled Meds: . arformoterol  15 mcg Nebulization BID  . budesonide (PULMICORT) nebulizer solution  0.25 mg Nebulization BID  . Chlorhexidine Gluconate Cloth  6 each Topical Daily  . feeding supplement (PRO-STAT SUGAR FREE 64)  30 mL Oral BID  . Gerhardt's butt cream   Topical QID  . heparin injection (subcutaneous)  5,000 Units Subcutaneous Q8H  . levothyroxine  100 mcg Intravenous Daily  . sodium chloride flush  10-40 mL Intracatheter Q12H   Continuous Infusions: . cefTRIAXone (ROCEPHIN)  IV 2 g (12/31/19 1706)  . famotidine (PEPCID) IV 20 mg (12/31/19 1254)  . metronidazole 500 mg (12/31/19 1545)  . [START ON 01/02/2020] vancomycin       LOS: 2 days    Time spent: 56 minutes were spent reviewing chart, interviewing and examining patient, and discussing plan with rapid response RN.     Patient is critically-ill with acute respiratory failure, requiring highly-complex decision making to prevent further life-threatening deterioration.   Vianne Bulls,  MD [facility]   To contact the attending provider between 7A-7P or the covering provider during after hours 7P-7A, please log into the web site [email or web address] and access using universal [place] password for that web site. If you do not have the password, please call the hospital operator.  12/31/2019, 9:31 PM

## 2020-01-01 DIAGNOSIS — M1A00X0 Idiopathic chronic gout, unspecified site, without tophus (tophi): Secondary | ICD-10-CM

## 2020-01-01 DIAGNOSIS — L97511 Non-pressure chronic ulcer of other part of right foot limited to breakdown of skin: Secondary | ICD-10-CM | POA: Diagnosis not present

## 2020-01-01 DIAGNOSIS — W19XXXA Unspecified fall, initial encounter: Secondary | ICD-10-CM

## 2020-01-01 DIAGNOSIS — M6282 Rhabdomyolysis: Secondary | ICD-10-CM | POA: Diagnosis not present

## 2020-01-01 DIAGNOSIS — N179 Acute kidney failure, unspecified: Secondary | ICD-10-CM | POA: Diagnosis not present

## 2020-01-01 DIAGNOSIS — E440 Moderate protein-calorie malnutrition: Secondary | ICD-10-CM | POA: Diagnosis not present

## 2020-01-01 LAB — COMPREHENSIVE METABOLIC PANEL
ALT: 35 U/L (ref 0–44)
AST: 43 U/L — ABNORMAL HIGH (ref 15–41)
Albumin: 2.7 g/dL — ABNORMAL LOW (ref 3.5–5.0)
Alkaline Phosphatase: 119 U/L (ref 38–126)
Anion gap: 7 (ref 5–15)
BUN: 67 mg/dL — ABNORMAL HIGH (ref 8–23)
CO2: 28 mmol/L (ref 22–32)
Calcium: 8 mg/dL — ABNORMAL LOW (ref 8.9–10.3)
Chloride: 120 mmol/L — ABNORMAL HIGH (ref 98–111)
Creatinine, Ser: 2.19 mg/dL — ABNORMAL HIGH (ref 0.44–1.00)
GFR calc Af Amer: 25 mL/min — ABNORMAL LOW (ref >60)
GFR calc non Af Amer: 22 mL/min — ABNORMAL LOW (ref >60)
Glucose, Bld: 107 mg/dL — ABNORMAL HIGH (ref 70–99)
Potassium: 3.2 mmol/L — ABNORMAL LOW (ref 3.5–5.1)
Sodium: 155 mmol/L — ABNORMAL HIGH (ref 135–145)
Total Bilirubin: 2 mg/dL — ABNORMAL HIGH (ref 0.3–1.2)
Total Protein: 5.5 g/dL — ABNORMAL LOW (ref 6.5–8.1)

## 2020-01-01 LAB — GLUCOSE, CAPILLARY
Glucose-Capillary: 101 mg/dL — ABNORMAL HIGH (ref 70–99)
Glucose-Capillary: 81 mg/dL (ref 70–99)
Glucose-Capillary: 117 mg/dL — ABNORMAL HIGH (ref 70–99)
Glucose-Capillary: 118 mg/dL — ABNORMAL HIGH (ref 70–99)
Glucose-Capillary: 126 mg/dL — ABNORMAL HIGH (ref 70–99)

## 2020-01-01 LAB — CBC
HCT: 36.5 % (ref 36.0–46.0)
Hemoglobin: 10.4 g/dL — ABNORMAL LOW (ref 12.0–15.0)
MCH: 30.4 pg (ref 26.0–34.0)
MCHC: 28.5 g/dL — ABNORMAL LOW (ref 30.0–36.0)
MCV: 106.7 fL — ABNORMAL HIGH (ref 80.0–100.0)
Platelets: 110 10*3/uL — ABNORMAL LOW (ref 150–400)
RBC: 3.42 MIL/uL — ABNORMAL LOW (ref 3.87–5.11)
RDW: 19.1 % — ABNORMAL HIGH (ref 11.5–15.5)
WBC: 4.2 10*3/uL (ref 4.0–10.5)
nRBC: 0.7 % — ABNORMAL HIGH (ref 0.0–0.2)

## 2020-01-01 LAB — MAGNESIUM: Magnesium: 2.1 mg/dL (ref 1.7–2.4)

## 2020-01-01 LAB — HEMOGLOBIN A1C
Hgb A1c MFr Bld: 5.1 % (ref 4.8–5.6)
Mean Plasma Glucose: 100 mg/dL

## 2020-01-01 LAB — C-REACTIVE PROTEIN: CRP: 6.4 mg/dL — ABNORMAL HIGH (ref <1.0)

## 2020-01-01 LAB — PHOSPHORUS: Phosphorus: 3.4 mg/dL (ref 2.5–4.6)

## 2020-01-01 MED ORDER — FUROSEMIDE 10 MG/ML IJ SOLN
40.0000 mg | Freq: Two times a day (BID) | INTRAMUSCULAR | Status: DC
  Administered 2020-01-01 – 2020-01-03 (×5): 40 mg via INTRAVENOUS
  Filled 2020-01-01 (×5): qty 4

## 2020-01-01 MED ORDER — ALBUMIN HUMAN 25 % IV SOLN
12.5000 g | Freq: Four times a day (QID) | INTRAVENOUS | Status: AC
  Administered 2020-01-01 (×2): 12.5 g via INTRAVENOUS
  Filled 2020-01-01 (×2): qty 50

## 2020-01-01 MED ORDER — ALBUMIN HUMAN 25 % IV SOLN
12.5000 g | Freq: Once | INTRAVENOUS | Status: AC
  Administered 2020-01-01: 12.5 g via INTRAVENOUS
  Filled 2020-01-01: qty 50

## 2020-01-01 MED ORDER — FREE WATER
200.0000 mL | Status: DC
  Administered 2020-01-01 – 2020-01-03 (×10): 200 mL via ORAL

## 2020-01-01 NOTE — Progress Notes (Signed)
PT Cancellation Note  Patient Details Name: [name] [name] MRN: [id number] DOB: 15-Dec-1945   Cancelled Treatment:    Reason Eval/Treat Not Completed: Medical issues which prohibited therapy;Patient at procedure or test/unavailable  Attempted to see pt x 3 today. Initially with decr sats; then working with SLP; then NT in with patient and waiting for RN to come change dressing on her neck.   Will attempt to see 01/02/20.   Arby Barrette, PT Pager [phone]   Rexanne Mano 01/01/2020, 5:27 PM

## 2020-01-01 NOTE — TOC Initial Note (Signed)
Transition of Care [facility]) - Initial/Assessment Note    Patient Details  Name: [name] [name] MRN: [id number] Date of Birth: 05-10-1946  [facility]) CM/SW Contact:    Geralynn Ochs, LCSW Phone Number: 01/01/2020, 12:44 PM  Clinical Narrative:    CSW received a message that patient's daughter wanted to discuss patient's case. CSW contacted patient's daughter, [name], via phone. Per [name], patient was recently at the hospital and was recommended for SNF but patient refused, so she was sent home. When [name] went to see the patient's house after she came to the hospital, there was fecal matter, used disposable underwear, and other garbage around the house, so the patient has not been caring for herself or the house. [name] has also gone through the patient's mail and she is behind on her bills, so [name] is going through the process of declaring her incompetent and obtaining guardianship over her as she is not caring for herself or managing her own affairs. [name] said after the patient went home on the previous hospitalization, she made an APS report, but the patient would refuse to allow the social workers in. Patient also refused to allow home health to come in and work with her. [name] contacted the Magistrate yesterday to IVC her mom in case she tries to leave again, as she is not safe to go back home and be on her own. Patient is under active IVC until 01/07/20 at 1:10 PM, unless rescinded prior to that time. At this time, awaiting PT/OT evals for patient and medical stability to determine appropriate disposition, but anticipating SNF placement at discharge. CSW to continue to follow.    Expected Discharge Plan: Skilled Nursing Facility Barriers to Discharge: Continued Medical Work up, Unsafe home situation   Patient Goals and CMS Choice Patient states their goals for this hospitalization and ongoing recovery are:: patient unable to participate in goal setting at this time due to  disorientation CMS Medicare.gov Compare Post Acute Care list provided to:: Patient Represenative (must comment) Choice offered to / list presented to : Adult Children  Expected Discharge Plan and Services Expected Discharge Plan: [place] Choice: [place] arrangements for the past 2 months: Single Family Home                                      Prior Living Arrangements/Services Living arrangements for the past 2 months: Single Family Home Lives with:: Self Patient language and need for interpreter reviewed:: No        Need for Family Participation in Patient Care: Yes (Comment) Care giver support system in place?: No (comment) Current home services: DME Criminal Activity/Legal Involvement Pertinent to Current Situation/Hospitalization: No - Comment as needed  Activities of Daily Living      Permission Sought/Granted Permission sought to share information with : Facility Sport and exercise psychologist, Family Supports Permission granted to share information with : Yes, Verbal Permission Granted  Share Information with NAME: [name]  Permission granted to share info w AGENCY: SNF  Permission granted to share info w Relationship: Daughter     Emotional Assessment   Attitude/Demeanor/Rapport: Unable to Assess Affect (typically observed): Unable to Assess Orientation: : Oriented to Self, Oriented to Place Alcohol / Substance Use: Not Applicable Psych Involvement: No (comment)  Admission diagnosis:  AKI (acute kidney injury) ([place]) [N17.9] LFT elevation [R79.89] Non-traumatic  rhabdomyolysis [M62.82] AMS (altered mental status) [R41.82] Patient Active Problem List   Diagnosis Date Noted  . Non-pressure chronic ulcer of other part of right foot limited to breakdown of skin ([place])   . Fall   . Moderate protein-calorie malnutrition ([place])   . Idiopathic chronic gout without tophus   . Acute on chronic respiratory  failure with hypoxia and hypercapnia (H[facility])   . AMS (altered mental status) 12/26/2019  . Non-traumatic rhabdomyolysis   . Low grade fever 11/24/2019  . Acute encephalopathy 11/02/2019  . Ileus ([place]) 11/01/2019  . Epigastric hernia 10/29/2019  . AKI (acute kidney injury) ([place]) 05/04/2019  . Hypotension 05/03/2019  . Anxiety 03/22/2018  . COPD with acute exacerbation ([place]) 12/28/2017  . Chronic respiratory failure with hypoxia and hypercapnia ([place]) 11/04/2016  . Tinea 09/26/2016  . Dyspnea on exertion 09/21/2016  . Morbid (severe) obesity due to excess calories ([place]) 09/21/2016  . History of left breast cancer 06/08/2016  . Panic attacks 04/27/2016  . Chest pain 04/11/2016  . OSA (obstructive sleep apnea) 02/12/2016  . Adhesive capsulitis of right shoulder 08/21/2015  . Right shoulder pain 07/16/2015  . Osteoarthritis, hand 07/16/2015  . Hair loss 07/16/2015  . Blood in mouth of unknown source 05/06/2015  . Dysphagia, pharyngoesophageal phase 05/06/2015  . Chronic respiratory failure ([place]) 10/28/2014  . Cellulitis of female breast 10/25/2014  . Rash and nonspecific skin eruption 10/25/2014  . Hx of colonic polyps   . Benign neoplasm of cecum   . Benign neoplasm of transverse colon   . CHF (congestive heart failure) ([place])   . History of Clostridium difficile 06/27/2014  . RUQ pain 05/31/2014  . Lump in the abdomen 05/31/2014  . Diarrhea 05/14/2014  . Abdominal pain, other specified site 05/14/2014  . Pacemaker 12/03/2013  . Exfoliative dermatitis 11/27/2013  . Urinary urgency 11/13/2013  . Post-menopausal bleeding 11/13/2013  . Syncope and collapse 08/30/2013  . Atrial fibrillation ([place])   . Syncope 08/26/2013  . Postmenopausal vaginal bleeding 05/02/2013  . Tachy-brady syndrome ([place]) 03/15/2013  . Acute on chronic diastolic CHF (congestive heart failure) ([place]) 03/15/2013  . PFO (patent foramen ovale) 03/15/2013  . Hypokalemia 03/15/2013  . Atrial flutter ([place])  03/01/2013  . Tachycardia 03/01/2013  . Anticoagulant long-term use 03/01/2013  . Impaired glucose tolerance 11/01/2012  . LFT elevation 11/01/2012  . Right lumbar radiculopathy 04/28/2012  . Cough 04/15/2012  . Abnormal TSH 04/15/2012  . Acute appendicitis 03/29/2012  . Sick sinus syndrome ([place]) 03/08/2012  . Morbid obesity with BMI of 50.0-59.9, adult ([place]) 03/08/2012  . Breast cancer ([place]) 06/13/2011  . Asthma 06/13/2011  . Depression 06/13/2011  . Colon polyps 06/13/2011  . Neuropathy 06/13/2011  . Cervical radiculopathy 06/13/2011  . Gout 06/13/2011  . Obesity hypoventilation syndrome ([place]) 06/13/2011  . Cellulitis of leg, right 06/13/2011  . Encounter for well adult exam with abnormal findings 06/04/2011  . OXYGEN-USE OF SUPPLEMENTAL 04/07/2010  . ACUT MI SUBENDOCARDIAL INFARCT SUBSQT EPIS CARE 03/13/2010  . COPD GOLD III 03/10/2010  . Sleep apnea 03/10/2010  . Hypothyroidism, acquired 03/17/2009  . Hyperlipidemia 03/17/2009  . Essential hypertension 03/17/2009  . Coronary atherosclerosis 03/17/2009   PCP:  Biagio Borg, MD Pharmacy:   [facility] [address] [place], [place] [address] Phone: [phone] Fax: [phone]  [facility] [phone] - [place], [place] [address] AT [place] [address] Phone: [phone]  Fax: [facility] - [place], [place] [address] Phone: [phone] Fax: [phone]     Social Determinants of Health (SDOH) Interventions    Readmission Risk Interventions Readmission Risk Prevention Plan 05/05/2019  Transportation Screening Complete  PCP or Specialist Appt within 3-5 Days Complete  HRI or [place] Complete  Social Work Consult for [place] Planning/Counseling Complete  Palliative Care Screening  Not Applicable  Medication Review Press photographer) Complete  Some recent data might be hidden

## 2020-01-01 NOTE — Consult Note (Addendum)
ORTHOPAEDIC CONSULTATION  REQUESTING PHYSICIAN: Lavina Hamman, MD  Chief Complaint: Rhabdomyolysis with ulcers of both legs and the plantar aspect of the right foot.  HPI: [name] [name] is a 74 y.o. female who presents with rhabdomyolysis swelling of both lower extremities with painful ulcerations of both legs.  Patient was found down at home and on the ground for approximately 6 days.  Past Medical History:  Diagnosis Date  . Anemia   . Asthma 06/13/2011   hx of  . Atrial fibrillation ([place])   . Atrial flutter ([place])   . Breast cancer ([place]) dx'd 2005   No BP check or stick in Left arm  . CAD (coronary artery disease)    a. Nonobstructive CAD 2007 (EF 75%.  RCA  proximal 75% tubular, 25% prox LAD, 25% d1, 50% D2) at [facility]. b. NSTEMI 01/2010 in setting of respiratory failure, medical approach (not a good candidate for noninvasive eval)  . Cellulitis    hx of cellulitis in left leg  . Cervical radiculopathy 06/13/2011  . CHF (congestive heart failure) ([place])   . Complication of anesthesia    per patient "hard to wake up and come out of the aneshthesia, happened years ago"  . COPD (chronic obstructive pulmonary disease) ([place])    on O2  . Depression 06/13/2011  . Diverticulosis   . Esophageal dysmotility   . Fatty liver   . Gout 06/13/2011  . History of Clostridium difficile early dec 2015   no current diarrhea  . History of home oxygen therapy    2 liters per nasal cannula all the time  . Hx of dysfunctional uterine bleeding    last bleeding jan 2016, worked up for with d and c x 2 no cause found  . Hyperlipidemia   . Hypertension   . Hypothyroidism   . Impaired glucose tolerance 11/01/2012  . Myocardial infarction ([place])   . Neuropathy    per patient in hands and in feet  . Obesity hypoventilation syndrome ([place]) 06/13/2011  . OSA on CPAP   . Personal history of chemotherapy   . Personal history of radiation therapy   . PFO (patent foramen ovale)   . Presence of permanent  cardiac pacemaker   . Risk for falls   . Syncope and collapse    Bradycardia with pauses. S/P pacemaker  . Tubular adenoma of colon    Past Surgical History:  Procedure Laterality Date  . APPENDECTOMY    . BREAST BIOPSY    . BREAST LUMPECTOMY Left 05/2004  . CARDIOVERSION N/A 03/15/2013   Procedure: CARDIOVERSION;  Surgeon: Thayer Headings, MD;  Location[facility] ENDOSCOPY;  Service: Cardiovascular;  Laterality: N/A;  . CERVICAL BIOPSY  June 2013   at [facility] for Heavy  Bleeding  . [place] SURGERY  2004 or 2005   have cadaver bones,, screws, and plates c 5 to c 6  . COLONOSCOPY    . COLONOSCOPY WITH PROPOFOL N/A 10/22/2014   Procedure: COLONOSCOPY WITH PROPOFOL;  Surgeon: Jerene Bears, MD;  Location: [facility] ENDOSCOPY;  Service: Gastroenterology;  Laterality: N/A;  . DILATION AND CURETTAGE OF UTERUS    . EPIGASTRIC HERNIA REPAIR    . EPIGASTRIC HERNIA REPAIR N/A 10/29/2019   Procedure: EPIGASTRIC HERNIA REPAIR WITH MESH;  Surgeon: Coralie Keens, MD;  Location: [place];  Service: General;  Laterality: N/A;  LMA ANESTHESIA  . EYE SURGERY    . LAPAROSCOPIC APPENDECTOMY  03/29/2012   Procedure: APPENDECTOMY LAPAROSCOPIC;  Surgeon: Adin Hector, MD;  Location: [facility] ORS;  Service: General;  Laterality: N/A;  . LOOP RECORDER EXPLANT  08/31/2013   Procedure: LOOP RECORDER EXPLANT;  Surgeon: Coralyn Mark, MD;  Location: [facility] CATH LAB;  Service: Cardiovascular;;  . LOOP RECORDER IMPLANT  08-28-2013; 08-31-2013   MDT LinQ implanted by Dr Rayann Heman for syncope; explanted 08-31-2013 after sinus pauses identified  . LOOP RECORDER IMPLANT N/A 08/28/2013   Procedure: LOOP RECORDER IMPLANT;  Surgeon: Coralyn Mark, MD;  Location: [place] CATH LAB;  Service: Cardiovascular;  Laterality: N/A;  . OVARIAN CYST REMOVAL    . PACEMAKER INSERTION  08-31-2013   Biotronik Evera dual chamber pacemaker placed for neurocardiogenic syncope and sinus pauses by Dr Rayann Heman  . PERMANENT PACEMAKER INSERTION N/A 08/31/2013   Procedure:  PERMANENT PACEMAKER INSERTION;  Surgeon: Coralyn Mark, MD;  Location: [place] CATH LAB;  Service: Cardiovascular;  Laterality: N/A;  . TEE WITHOUT CARDIOVERSION N/A 03/15/2013   Procedure: TRANSESOPHAGEAL ECHOCARDIOGRAM (TEE);  Surgeon: Thayer Headings, MD;  Location: [facility] ENDOSCOPY;  Service: Cardiovascular;  Laterality: N/A;   Social History   Socioeconomic History  . Marital status: Divorced    Spouse name: Not on file  . Number of children: 2  . Years of education: Not on file  . Highest education level: Not on file  Occupational History  . Occupation: Retired  Tobacco Use  . Smoking status: Former Smoker    Packs/day: 1.00    Years: 20.00    Pack years: 20.00    Types: Cigarettes    Quit date: 09/20/1998    Years since quitting: 21.2  . Smokeless tobacco: Never Used  . Tobacco comment: 1ppd x 20 years  Substance and Sexual Activity  . Alcohol use: Yes    Comment: rare, on holidays, and on special occasions per patient on 10/25/2019  . Drug use: No  . Sexual activity: Not Currently    Birth control/protection: Post-menopausal  Other Topics Concern  . Not on file  Social History Narrative   Lives alone in an apartment on the first floor.   Has 2 children.  Retired Advertising copywriter.  Education: some college.    Social Determinants of Health   Financial Resource Strain: Low Risk   . Difficulty of Paying Living Expenses: Not very hard  Food Insecurity: No Food Insecurity  . Worried About Charity fundraiser in the Last Year: Never true  . Ran Out of Food in the Last Year: Never true  Transportation Needs: No Transportation Needs  . Lack of Transportation (Medical): No  . Lack of Transportation (Non-Medical): No  Physical Activity: Inactive  . Days of Exercise per Week: 0 days  . Minutes of Exercise per Session: 0 min  Stress: No Stress Concern Present  . Feeling of Stress : Only a little  Social Connections: Slightly Isolated  . Frequency of Communication with Friends and  Family: More than three times a week  . Frequency of Social Gatherings with Friends and Family: More than three times a week  . Attends Religious Services: More than 4 times per year  . Active Member of Clubs or Organizations: Yes  . Attends Archivist Meetings: More than 4 times per year  . Marital Status: Divorced   Family History  Problem Relation Age of Onset  . Uterine cancer Mother   . Heart disease Mother   . Breast cancer Sister 37  . Ovarian cancer Sister   . Breast cancer Cousin  60  . Diabetes Brother   . Breast cancer Maternal Aunt   . Ovarian cancer Sister    - negative except otherwise stated in the family history section Allergies  Allergen Reactions  . Cephalexin Other (See Comments)    Exfoliative dermatitis reaction Fine on ceftriaxone  . Ciprofloxacin Other (See Comments)    Folliculitis   . Lisinopril Cough  . Codeine     REACTION: unknown - pt. states unaware of having reaction to codeine  . Latex Dermatitis  . Tape Dermatitis  . Cleocin [Clindamycin Hcl] Rash  . Dilaudid [Hydromorphone] Other (See Comments)    Oversedation  . Doxycycline Rash  . Penicillins Rash    Did it involve swelling of the face/tongue/throat, SOB, or low BP? No Did it involve sudden or severe rash/hives, skin peeling, or any reaction on the inside of your mouth or nose? Yes Did you need to seek medical attention at a hospital or doctor's office?Yes When did it last happen?several years ago If all above answers are "NO", may proceed with cephalosporin use.   Prior to Admission medications   Medication Sig Start Date End Date Taking? Authorizing Provider  albuterol (PROAIR HFA) 108 (90 Base) MCG/ACT inhaler Inhale 2 puffs into the lungs See admin instructions. Inhale 2 puffs into the lungs in the morning, 2 puffs at bedtime, and one to two times daily as needed for shortness of breath or wheezing   Yes [provider]  apixaban (ELIQUIS) 5 MG TABS tablet  Take 1 tablet (5 mg total) by mouth 2 (two) times daily. 10/02/19  Yes Biagio Borg, MD  AZO-CRANBERRY PO Take 1 tablet by mouth daily as needed (urinary symptoms).    Yes [provider]  B Complex Vitamins (VITAMIN B COMPLEX PO) Take 1 tablet by mouth daily.   Yes [provider]  budesonide-formoterol (SYMBICORT) 160-4.5 MCG/ACT inhaler Inhale 2 puffs into the lungs 2 (two) times daily. 10/22/19  Yes Biagio Borg, MD  cholecalciferol (VITAMIN D3) 25 MCG (1000 UNIT) tablet Take 2,000 Units by mouth daily.   Yes [provider]  diphenhydrAMINE (BENADRYL) 25 MG tablet Take 25 mg by mouth every 6 (six) hours as needed for allergies.   Yes [provider]  hydrOXYzine (ATARAX/VISTARIL) 25 MG tablet Take 25 mg by mouth 3 (three) times daily as needed for itching.   Yes [provider]  OXYGEN Inhale 2-3 L/min into the lungs continuous.    Yes [provider]  potassium chloride SA (KLOR-CON) 20 MEQ tablet Take 1 tablet (20 mEq total) by mouth 2 (two) times daily. 11/06/19  Yes Shelly Coss, MD  traMADol (ULTRAM) 50 MG tablet Take 1 tablet (50 mg total) by mouth daily as needed for moderate pain. Patient taking differently: Take 50 mg by mouth every 6 (six) hours as needed for moderate pain.  10/02/19  Yes Biagio Borg, MD  vitamin C (ASCORBIC ACID) 500 MG tablet Take 500 mg by mouth daily.    Yes [provider]  VITAMIN E PO Take 1,000 Units by mouth daily.    Yes [provider]  allopurinol (ZYLOPRIM) 300 MG tablet TAKE 1 TABLET DAILY Patient not taking: No sig reported 05/17/17   Biagio Borg, MD  amitriptyline (ELAVIL) 100 MG tablet Take 1 tablet (100 mg total) by mouth at bedtime. Patient not taking: Reported on 12/31/2019 06/13/19   Biagio Borg, MD  atorvastatin (LIPITOR) 40 MG tablet TAKE 1 TABLET DAILY Patient  not taking: No sig reported 09/26/18   Lelon Perla, MD  clonazePAM (KLONOPIN) 0.5 MG tablet Take 1 tablet  (0.5 mg total) by mouth 2 (two) times daily as needed for anxiety. Patient not taking: Reported on 01/08/2020 12/19/19   Biagio Borg, MD  DULoxetine (CYMBALTA) 30 MG capsule TAKE 2 CAPSULES (60 MG TOTAL) BY MOUTH AT BEDTIME. Patient not taking: Reported on 01/10/2020 12/17/19   Biagio Borg, MD  furosemide (LASIX) 40 MG tablet Take 1 tablet (40 mg total) by mouth 2 (two) times daily. Patient not taking: Reported on 12/31/2019 11/06/19   Shelly Coss, MD  levothyroxine (SYNTHROID) 200 MCG tablet Take 1 tablet (200 mcg total) by mouth daily. Annual appt due in Sept must see provider for future refills Patient not taking: Reported on 01/12/2020 12/13/19   Biagio Borg, MD  metoprolol tartrate (LOPRESSOR) 25 MG tablet Take 0.5 tablets (12.5 mg total) by mouth as needed (for heart rate above 120 at rest). Patient not taking: Reported on 01/15/2020 09/28/19   Lelon Perla, MD  omeprazole (PRILOSEC) 40 MG capsule Take 1 capsule (40 mg total) by mouth daily. Patient not taking: Reported on 01/03/2020 05/10/19   Domenic Moras, PA-C   DG Chest 1 View  Result Date: 12/30/2019 CLINICAL DATA:  Central line placement EXAM: CHEST  1 VIEW COMPARISON:  Chest x-ray 12/30/2019 at 10:19 a.m. FINDINGS: Dual lead pacer device remains in place. Left-sided central venous catheter takes an unusual course, extending along the left upper chest, passing beyond the expected boundary of the left brachiocephalic vein and extending along the left lateral margin of the thoracic aorta in the superior mediastinum. Heart size remains enlarged. No signs of large effusion on the current study. No pneumothorax on semi erect view. Lungs are clear. Visualized skeletal structures without acute finding. Cervical spinal fusion as before. IMPRESSION: Abnormal course of left sided central venous line, this may be within a tributary of the brachiocephalic venous access system, sub primum intercostal vein or hemi azygous branch, potentially also with  arterial placement based on position. Extra-venous extension into the mediastinum or pleural space is also considered. Chest CT may be helpful to determine next steps for management. Cardiomegaly with dual lead pacer device. Critical Value/emergent results were called by telephone at the time of interpretation on 12/30/2019 at 11:57 a.m. to provider NP Noe Gens, who verbally acknowledged these results. Electronically Signed   By: Zetta Bills M.D.   On: 12/30/2019 12:15   DG Elbow 2 Views Right  Result Date: 12/30/2019 CLINICAL DATA:  Possible pressure ulcer on right elbow. EXAM: RIGHT ELBOW - 2 VIEW COMPARISON:  None. FINDINGS: Bandaging is seen over the posterior right elbow. No fracture or joint effusion identified. No bony erosion. No foreign body noted. IMPRESSION: Soft tissue edema. No fracture, erosion, foreign body, or bony ulceration. Electronically Signed   By: Dorise Bullion III M.D   On: 12/30/2019 12:30   CT HEAD WO CONTRAST  Result Date: 12/30/2019 CLINICAL DATA:  74 year old female with encephalopathy. Recent fall. EXAM: CT HEAD WITHOUT CONTRAST TECHNIQUE: Contiguous axial images were obtained from the base of the skull through the vertex without intravenous contrast. COMPARISON:  Head and cervical spine CT yesterday. Head CT 11/01/2019 and earlier. FINDINGS: Brain: Gray-white matter differentiation appears stable and within normal limits for age. No midline shift, ventriculomegaly, mass effect, evidence of mass lesion, intracranial hemorrhage or evidence of cortically based acute infarction. There are multiple scattered small punctate calcifications in the  subarachnoid spaces, mostly in the left hemisphere. This is stable and probably postinflammatory, rather than due to distal vessel calcified atherosclerosis. Vascular: Calcified atherosclerosis at the skull base. No suspicious intracranial vascular hyperdensity. Skull: No acute osseous abnormality identified. Sinuses/Orbits:  Visualized paranasal sinuses and mastoids are stable and well pneumatized. Other: Broad-based right side scalp hematoma appears stable. No scalp soft tissue gas. Orbits soft tissues appear intact. IMPRESSION: 1. Stable and negative for age non contrast CT appearance of the brain. 2. Broad-based right scalp hematoma. Electronically Signed   By: Genevie Ann M.D.   On: 12/30/2019 17:05   DG CHEST PORT 1 VIEW  Result Date: 12/31/2019 CLINICAL DATA:  Acute respiratory failure with hypoxia. EXAM: PORTABLE CHEST 1 VIEW COMPARISON:  12/30/2019 FINDINGS: Stable enlargement of the cardiac silhouette. Stable position of the dual lead cardiac pacemaker. Left jugular central line is stable and the tip is pointing either anteriorly or posteriorly and probably within a small branch of the left innominate vein. Left jugular central line placement is stable from the previous examination. Negative for pneumothorax. Prominent interstitial lung markings bilaterally. Extensive degenerative changes in both shoulders. Surgical plate in the lower cervical spine. IMPRESSION: 1. Stable cardiomegaly. 2. Prominent interstitial lung markings. Cannot exclude interstitial edema. 3. No change in the appearance or position of the left jugular central line. Suspect that the line is extending into a small branch off the left innominate vein. Electronically Signed   By: Markus Daft M.D.   On: 12/31/2019 08:13   DG CHEST PORT 1 VIEW  Result Date: 12/30/2019 CLINICAL DATA:  Shortness of breath EXAM: PORTABLE CHEST 1 VIEW COMPARISON:  December 29, 2019 FINDINGS: Stable cardiomegaly. A pacemaker is noted. The hila and mediastinum are normal. No pneumothorax. No nodules or masses. No focal infiltrates or overt edema. IMPRESSION: No active disease. Electronically Signed   By: Dorise Bullion III M.D   On: 12/30/2019 12:31   ECHOCARDIOGRAM COMPLETE  Result Date: 12/30/2019    ECHOCARDIOGRAM REPORT   Patient Name:   ICA DAYE Date of Exam: 12/30/2019  Medical Rec #:  [id number]     Height:       67.0 in Accession #:    [id number]    Weight:       300.0 lb Date of Birth:  1946-05-06    BSA:          2.403 m Patient Age:    86 years      BP:           118/42 mmHg Patient Gender: F             HR:           93 bpm. Exam Location:  Inpatient Procedure: 2D Echo, Cardiac Doppler and Color Doppler Indications:    R06.02 SOB  History:        Patient has prior history of Echocardiogram examinations, most                 recent 05/09/2018. CHF, Abnormal ECG, Pacemaker and SSS, COPD,                 Signs/Symptoms:Altered Mental Status and Chest Pain; Risk                 Factors:Hypertension, Dyslipidemia and Sleep Apnea. PFO. Tacky                 brady syndrome. Rhabdomyolysis.  Sonographer:    Roseanna Rainbow RDCS Referring Phys:  [id number] [place]  1. Left ventricular ejection fraction, by estimation, is 55 to 60%. The left ventricle has normal function. The left ventricle has no regional wall motion abnormalities. Left ventricular diastolic parameters are indeterminate.  2. Right ventricular systolic function is normal. The right ventricular size is normal. There is moderately elevated pulmonary artery systolic pressure.  3. Left atrial size was moderately dilated.  4. Right atrial size was severely dilated.  5. The mitral valve is grossly normal. Mild to moderate mitral valve regurgitation.  6. Tricuspid valve regurgitation is moderate.  7. The aortic valve is tricuspid. Aortic valve regurgitation is moderate. Mild aortic valve stenosis. FINDINGS  Left Ventricle: Left ventricular ejection fraction, by estimation, is 55 to 60%. The left ventricle has normal function. The left ventricle has no regional wall motion abnormalities. The left ventricular internal cavity size was normal in size. There is  no left ventricular hypertrophy. Left ventricular diastolic parameters are indeterminate. Right Ventricle: The right ventricular size is normal. No increase in right  ventricular wall thickness. Right ventricular systolic function is normal. There is moderately elevated pulmonary artery systolic pressure. The tricuspid regurgitant velocity is 2.81 m/s, and with an assumed right atrial pressure of 15 mmHg, the estimated right ventricular systolic pressure is 09.7 mmHg. Left Atrium: Left atrial size was moderately dilated. Right Atrium: Right atrial size was severely dilated. Pericardium: There is no evidence of pericardial effusion. Mitral Valve: The mitral valve is grossly normal. Mild mitral annular calcification. Mild to moderate mitral valve regurgitation. MV peak gradient, 17.6 mmHg. The mean mitral valve gradient is 5.0 mmHg. Tricuspid Valve: The tricuspid valve is normal in structure. Tricuspid valve regurgitation is moderate. Aortic Valve: The aortic valve is tricuspid. . There is mild thickening and mild calcification of the aortic valve. Aortic valve regurgitation is moderate. Aortic regurgitation PHT measures 466 msec. Mild aortic stenosis is present. There is mild thickening of the aortic valve. There is mild calcification of the aortic valve. Aortic valve mean gradient measures 10.0 mmHg. Aortic valve peak gradient measures 22.7 mmHg. Aortic valve area, by VTI measures 2.40 cm. Pulmonic Valve: The pulmonic valve was normal in structure. Pulmonic valve regurgitation is not visualized. No evidence of pulmonic stenosis. Aorta: The aortic root and ascending aorta are structurally normal, with no evidence of dilitation. IAS/Shunts: The atrial septum is grossly normal. Additional Comments: A pacer wire is visualized.  LEFT VENTRICLE PLAX 2D LVIDd:         4.80 cm LVIDs:         3.20 cm LV PW:         1.70 cm LV IVS:        1.10 cm LVOT diam:     2.00 cm LV SV:         88 LV SV Index:   37 LVOT Area:     3.14 cm  LV Volumes (MOD) LV vol d, MOD A2C: 65.8 ml LV vol d, MOD A4C: 97.7 ml LV vol s, MOD A2C: 29.7 ml LV vol s, MOD A4C: 25.3 ml LV SV MOD A2C:     36.1 ml LV SV MOD  A4C:     97.7 ml LV SV MOD BP:      53.1 ml RIGHT VENTRICLE             IVC RV S prime:     15.30 cm/s  IVC diam: 3.50 cm TAPSE (M-mode): 2.6 cm LEFT ATRIUM  Index       RIGHT ATRIUM           Index LA diam:        4.30 cm  1.79 cm/m  RA Area:     36.80 cm LA Vol (A2C):   120.0 ml 49.95 ml/m RA Volume:   149.00 ml 62.02 ml/m LA Vol (A4C):   99.3 ml  41.33 ml/m LA Biplane Vol: 110.0 ml 45.78 ml/m  AORTIC VALVE AV Area (Vmax):    2.31 cm AV Area (Vmean):   2.20 cm AV Area (VTI):     2.40 cm AV Vmax:           238.00 cm/s AV Vmean:          147.000 cm/s AV VTI:            0.366 m AV Peak Grad:      22.7 mmHg AV Mean Grad:      10.0 mmHg LVOT Vmax:         175.00 cm/s LVOT Vmean:        103.000 cm/s LVOT VTI:          0.280 m LVOT/AV VTI ratio: 0.77 AI PHT:            466 msec  AORTA Ao Root diam: 3.00 cm Ao Asc diam:  3.70 cm MITRAL VALVE                 TRICUSPID VALVE MV Peak grad: 17.6 mmHg      TR Peak grad:   31.6 mmHg MV Mean grad: 5.0 mmHg       TR Vmax:        281.00 cm/s MV Vmax:      2.10 m/s MV Vmean:     100.2 cm/s     SHUNTS MR Peak grad:    113.6 mmHg  Systemic VTI:  0.28 m MR Mean grad:    86.0 mmHg   Systemic Diam: 2.00 cm MR Vmax:         533.00 cm/s MR Vmean:        456.0 cm/s MR PISA:         2.26 cm MR PISA Eff ROA: 14 mm MR PISA Radius:  0.60 cm Mertie Moores MD Electronically signed by Mertie Moores MD Signature Date/Time: 12/30/2019/1:45:17 PM    Final    VAS [place] LOWER EXTREMITY VENOUS (DVT)  Result Date: 12/31/2019  Lower Venous DVTStudy Indications: Edema.  Risk Factors: Rhabdomyolysis after being down in floor for a week. Limitations: Bandages and pain with touch/compression. Comparison Study: Prior RLEV done 07/06/16 is available for comparison Performing Technologist: Sharion Dove RVS  Examination Guidelines: A complete evaluation includes B-mode imaging, spectral Doppler, color Doppler, and power Doppler as needed of all accessible portions of each vessel.  Bilateral testing is considered an integral part of a complete examination. Limited examinations for reoccurring indications may be performed as noted. The reflux portion of the exam is performed with the patient in reverse Trendelenburg.  +---------+---------------+---------+-----------+----------+--------------+ RIGHT    CompressibilityPhasicitySpontaneityPropertiesThrombus Aging +---------+---------------+---------+-----------+----------+--------------+ CFV      Full           Yes      Yes                                 +---------+---------------+---------+-----------+----------+--------------+ SFJ      Full                                                        +---------+---------------+---------+-----------+----------+--------------+  FV Prox                 Yes      Yes                                 +---------+---------------+---------+-----------+----------+--------------+ FV Mid                  Yes      Yes                                 +---------+---------------+---------+-----------+----------+--------------+ FV Distal               Yes      Yes                                 +---------+---------------+---------+-----------+----------+--------------+ POP                     Yes      Yes                                 +---------+---------------+---------+-----------+----------+--------------+ PTV                                                   Not visualized +---------+---------------+---------+-----------+----------+--------------+ PERO                                                  Not visualized +---------+---------------+---------+-----------+----------+--------------+   +---------+---------------+---------+-----------+----------+--------------+ LEFT     CompressibilityPhasicitySpontaneityPropertiesThrombus Aging +---------+---------------+---------+-----------+----------+--------------+ CFV      Full           Yes       Yes                                 +---------+---------------+---------+-----------+----------+--------------+ SFJ      Full                                                        +---------+---------------+---------+-----------+----------+--------------+ FV Prox                 Yes      Yes                                 +---------+---------------+---------+-----------+----------+--------------+ FV Mid                  Yes      Yes                                 +---------+---------------+---------+-----------+----------+--------------+ FV Distal  Yes      Yes                                 +---------+---------------+---------+-----------+----------+--------------+ POP      Full           Yes      Yes                                 +---------+---------------+---------+-----------+----------+--------------+ PTV                                                   Not visualized +---------+---------------+---------+-----------+----------+--------------+ PERO                                                  Not visualized +---------+---------------+---------+-----------+----------+--------------+     Summary: RIGHT: - Findings appear essentially unchanged compared to previous examination. - There is no evidence of deep vein thrombosis in the lower extremity. However, portions of this examination were limited- see technologist comments above.  LEFT: - There is no evidence of deep vein thrombosis in the lower extremity. However, portions of this examination were limited- see technologist comments above.  *See table(s) above for measurements and observations.    Preliminary    VAS [place] UPPER EXTREMITY VENOUS DUPLEX  Result Date: 12/31/2019 UPPER VENOUS STUDY  Indications: Edema Risk Factors: Rhabdomyolysis after being down in floor for 1 week. Anticoagulation: Heparin. Limitations: Central line and bandage in left neck and body habitus. Comparison  Study: Prior LUE venous duplex done 11/04/19 is available for                   comparison Performing Technologist: Sharion Dove RVS  Examination Guidelines: A complete evaluation includes B-mode imaging, spectral Doppler, color Doppler, and power Doppler as needed of all accessible portions of each vessel. Bilateral testing is considered an integral part of a complete examination. Limited examinations for reoccurring indications may be performed as noted.  Right Findings: +----------+------------+---------+-----------+----------+--------------+ RIGHT     CompressiblePhasicitySpontaneousProperties   Summary     +----------+------------+---------+-----------+----------+--------------+ IJV           Full                                                 +----------+------------+---------+-----------+----------+--------------+ Subclavian               Yes       Yes                             +----------+------------+---------+-----------+----------+--------------+ Axillary                                            Not visualized +----------+------------+---------+-----------+----------+--------------+ Brachial      Full                                                 +----------+------------+---------+-----------+----------+--------------+  Cephalic                                            Not visualized +----------+------------+---------+-----------+----------+--------------+ Basilic                                             Not visualized +----------+------------+---------+-----------+----------+--------------+  Left Findings: +----------+------------+---------+-----------+----------+--------------+ LEFT      CompressiblePhasicitySpontaneousProperties   Summary     +----------+------------+---------+-----------+----------+--------------+ IJV                                                 Not visualized  +----------+------------+---------+-----------+----------+--------------+ Subclavian               Yes       Yes                             +----------+------------+---------+-----------+----------+--------------+ Axillary                 Yes       Yes                             +----------+------------+---------+-----------+----------+--------------+ Brachial      Full       Yes       Yes                             +----------+------------+---------+-----------+----------+--------------+ Cephalic                                            Not visualized +----------+------------+---------+-----------+----------+--------------+ Basilic                                             Not visualized +----------+------------+---------+-----------+----------+--------------+  Summary:  Right: No evidence of deep vein thrombosis in the upper extremity. However, unable to visualize the axillary, cephalic, and basilic veins.  Left: No evidence of deep vein thrombosis in the upper extremity. However, unable to visualize the cephalic, basilic, and internal jugular veins.  *See table(s) above for measurements and observations.    Preliminary    - pertinent xrays, CT, MRI studies were reviewed and independently interpreted  Positive ROS: All other systems have been reviewed and were otherwise negative with the exception of those mentioned in the HPI and as above.  Physical Exam: General: Alert, no acute distress Psychiatric: Patient is competent for consent with normal mood and affect Lymphatic: No axillary or cervical lymphadenopathy Cardiovascular: No pedal edema Respiratory: No cyanosis, no use of accessory musculature GI: No organomegaly, abdomen is soft and non-tender    Images:  @ENCIMAGES @  Labs:  Lab Results  Component Value Date   HGBA1C 5.1 12/31/2019   HGBA1C 5.2 05/03/2019   HGBA1C 5.6 05/12/2017   ESRSEDRATE 5 12/31/2019   ESRSEDRATE 32 (  H) 05/31/2014    ESRSEDRATE 53 (H) 11/01/2012   CRP 6.4 (H) 01/01/2020   CRP 8.5 (H) 12/31/2019   CRP 1.4 05/31/2014   LABURIC 15.3 (H) 01/11/2020   LABURIC 6.7 02/18/2010   LABURIC 8.8 (H) 10/28/2008   REPTSTATUS 12/30/2019 FINAL 12/27/2019   CULT  01/03/2020    NO GROWTH Performed at [facility], [place] [address], [place], [place] [address]    LABORGA No Salmonella,Shigella,Campylobacter,Yersinia,or 06/03/2014   LABORGA No E.coli 0157:H7 isolated. 06/03/2014    Lab Results  Component Value Date   ALBUMIN 2.8 (L) 12/31/2019   ALBUMIN 3.4 (L) 12/30/2019   ALBUMIN 3.3 (L) 01/12/2020   PREALBUMIN 6.2 (L) 12/31/2019   LABURIC 15.3 (H) 01/04/2020   LABURIC 6.7 02/18/2010   LABURIC 8.8 (H) 10/28/2008    Neurologic: Patient does  have protective sensation bilateral lower extremities.   MUSCULOSKELETAL:   Skin: Examination patient's legs are painful to palpation bilaterally.  The skin is now wrinkling there are large superficial blisters on both lower extremities that are draining clear serous fluid at this time no signs of any chronic venous ulcers no arterial ulcers.  Examination of both feet patient has a strong dorsalis pedis pulse.  Her feet are warm.  Patient has a decubitus ulcer on the plantar aspect of the right foot that is acute.  There is healthy granulation tissue at the base the ulcer is 2 cm in diameter 1 mm in depth.  No exposed bone or tendon.  Patient has moderate protein caloric malnutrition with albumin of 2.8.  She has significant gout with a uric acid of 15.3.  Review of the venous study shows no evidence of DVT in either lower extremity.  Assessment: Assessment: Resolving blisters of both legs secondary to swelling with a stable decubitus ulcer on the right foot with moderate protein caloric malnutrition resolving rhabdomyolysis and gout.  Plan: Plan: Continue with the Prevalon boots both lower extremities, and continue with the Mepilex dressings for the leg blisters. I  do not feel that compression wraps are necessary for her legs the swelling is resolving.  No signs of infection either leg antibiotics could be discontinued if for treatment of her legs.  Patient may require colchicine for her gout.  I will follow-up as an outpatient.  Patient may be weightbearing as tolerated for physical therapy bilateral lower extremities  Thank you for the consult and the opportunity to see Ms. Leanord Asal, [place] [phone] 7:16 AM

## 2020-01-01 NOTE — Progress Notes (Signed)
RT titrated FIO2 on Bipap during her neb treatments to 30%. Patient's sats remained 98% or higher. Patient was then placed on a [place] 15 L, sats 96%.  RT stayed with patient at bedside for excess of 30 minutes.  Patient tolerating well. Wants something to eat and drink. MD notified.

## 2020-01-01 NOTE — Progress Notes (Signed)
[facility] Progress Note  Patient: [name] [name]    HUT:[id number]  DOA: 01/13/2020     Date of Service: the patient was seen and examined on 01/01/2020  Chief Complaint  Patient presents with  . Fall   Brief hospital course: Past medical history of COPD on 2 LPM chronically, chronic respiratory failure, chronic CHF, OSA, A. fib, anemia, CKD 3, hypothyroidism, PPM implant.  Presented with a fall that occurred on Easter Sunday.  Patient lives alone and neighbors noted mailbox was full and packages were not picked up.  Significantly confused with multiple skin breakdowns and hypoxia on arrival.  Found to have acute kidney injury, hyperkalemia, hyponatremia, rhabdomyolysis and hypoxia. Currently further plan is continue current therapy.  Assessment and Plan: 1.  Rhabdomyolysis From prolonged immobility. Initial CK 2000.  Currently CK 400.  Hold IV hydration given volume overload  2.  Acute kidney injury on chronic kidney disease stage IIIb Baseline renal function around 1.  Recently in March renal function worsened to 1.46.  Currently serum creatinine 2.4 and trending up.  Urine output 500 cc.  Ultrasound renal unremarkable for any acute obstruction.  Ultrasound abdomen also unremarkable for any acute significant abnormality.   Ultrasound renal unremarkable. Patient was actually given IV Lasix with improvement of renal function. IV albumin given as well before Lasix.  3.  Hypernatremia.  Hyperkalemia.  Non-anion gap metabolic acidosis.  Hyperchloremia. Multiple electrolyte abnormality likely secondary to prolonged immobilization and dehydration. Currently holding IV fluids.  Will monitor response.  Potassium improving.  4.  Acute metabolic encephalopathy Acute hypercarbic respiratory acidosis Acute hypoxic respiratory failure secondary to volume overload. Etiology still not clear likely in the setting of prolonged immobilization and AKI. We will continue to monitor closely. CT  head unremarkable.  Repeat CT scan unremarkable as well for any acute stroke.  Patient unable to get MRI secondary to pacemaker. Metabolic work-up shows elevated TSH but normal free T4. S56 normal.  Folic acid low normal.  LFTs elevated. ABG on 12/31/2019 shows evidence of hypercarbia as well as hypoxia. BiPAP as needed. CPAP nightly. Continue high flow nasal cannula.  Low threshold to transition to ICU.  5.  Elevated LFT. Likely shock liver and prolonged immobilization and as well as rhabdomyolysis. Hold hepatotoxic medication.  Ultrasound liver negative for any significant abnormality.  Shows gallbladder sludge.  6.  Infiltrated IV. Limited IV access. On my evaluation patient's right upper extremity was significantly swollen and patient had pain while moving the extremity.  At extremity was also cold.  Patient does have adequate pulsation and sensation at the time of my evaluation verified with Doppler. We discontinued the IV line and had all the fluids.  Left upper extremities are still secondary to patient's breast cancer diagnosis with some mild edema there as well.  Not a candidate for PICC line placement given her AKI status as well as IV team was unable to visualize any veins on ultrasound due to his significant edema. Currently recommend elevation of the upper extremity. CCM was consulted for central line placement appreciate their assistance. We will monitor daily for need for IV line. Currently patient still with significant third spacing therefore we will continue IV with central line.  7.  Paroxysmal A. fib. Acute on chronic chronic diastolic CHF. Rate currently controlled. Patient is on chronic Eliquis. unable to tolerate anything p.o. Initially was on heparin.  Given that the patient does not have any evidence of DVT on upper or lower extremity Doppler currently I  will be holding off on anticoagulation as she has bleeding coming out from the central insertion site.  8.   Hypothyroidism. Patient significantly encephalopathic right now. Patient has not been able to take her Synthroid for last few days. Currently I will continue IV Synthroid. Likely can transition to oral after modified barium swallow scheduled on 01/02/2020.  9.  Bilateral upper and lower extremity edema. Vascular Doppler ordered.  Currently procedure pending. Currently covered by IV heparin.  10.  Dysphagia. In the setting of multifactorial encephalopathy. CT scan x2 - for any acute abnormality. Speech therapy consulted. Currently recommending this adjuvant diet. Modified barium swallow scheduled tomorrow.  11. Central line placement. Post placement chest x-ray shows central line potentially in the brachiocephalic venous system. VBG confirmed venous blood return. All ports are flushing appropriately for now. Currently will be using central line for venous access.  12.  Concern for cellulitis. Wound care consulted.  Concern for right leg lower extremity cellulitis with possible necrosis. IV vancomycin, ceftriaxone and Flagyl added. Orthopedic were consulted appreciate their assistance. Currently recommend no need for antibiotic.  Will monitor.  13. Morbid obesity OSA CPAP nightly. Body mass index is 46.99 kg/m.   Diet: Dysphagia 1 diet 10 liquid DVT Prophylaxis: Heparin for DVT prophylaxis  Advance goals of care discussion: Full code  Family Communication: no family was present at bedside, at the time of interview.  Discussed with daughter on the phone, Opportunity was given to ask question and all questions were answered satisfactorily.   Disposition:  Pt is from home, admitted with fall and multiorgan damage, still has acute kidney injury progressively worsening, which precludes a safe discharge. Discharge to SNF, when medically stable.  Subjective: Mentation improving.  Breathing still heavy.  No nausea no vomiting.  Significantly weak and fatigued right now.  Physical  Exam: General: Appear in moderate distress, no Rash; Oral Mucosa Clear, moist. no Abnormal Neck Mass Or lumps, Conjunctiva normal  Cardiovascular: S1 and S2 Present, no Murmur, Respiratory: increased respiratory effort, Bilateral Air entry present and bilateral  Crackles, no wheezes Abdomen: Bowel Sound present, Soft and no tenderness, no hernia Extremities: bilateral  Pedal edema, bilateral  calf tenderness Neurology: alert and oriented to time, place, and person affect anxious  Vitals:   01/01/20 0912 01/01/20 1130 01/01/20 1525 01/01/20 1632  BP: (!) 94/54 (!) 97/48  119/70  Pulse: 99  (!) 105 (!) 107  Resp: (!) 22 (!) 25 20 (!) 28  Temp:    98.6 F (37 C)  TempSrc:    Oral  SpO2: 93%   94%  Weight:      Height:        Intake/Output Summary (Last 24 hours) at 01/01/2020 1903 Last data filed at 01/01/2020 1824 Gross per 24 hour  Intake 1010.47 ml  Output 1901 ml  Net -890.53 ml   Filed Weights   12/25/2019 1046  Weight: 136.1 kg    Data Reviewed: I have personally reviewed and interpreted daily labs, tele strips, imagings as discussed above. I reviewed all nursing notes, pharmacy notes, vitals, pertinent old records I have discussed plan of care as described above with RN and patient/family.  CBC: Recent Labs  Lab 01/14/2020 1007 12/28/2019 1423 12/30/19 0329 12/31/19 0324 01/01/20 0420  WBC 6.9  --  6.7 3.9* 4.2  NEUTROABS 5.5  --   --   --   --   HGB 12.9 14.6 12.8 9.9* 10.4*  HCT 44.9 43.0 45.2 33.8* 36.5  MCV 106.4*  --  108.4* 103.7* 106.7*  PLT 171  --  142* 110* 072*   Basic Metabolic Panel: Recent Labs  Lab 12/30/19 2230 12/31/19 0217 12/31/19 0851 12/31/19 1700 01/01/20 0723  NA 150* 152* 152* 151* 155*  K 4.3 4.3 4.2 3.9 3.2*  CL 114* 116* 117* 116* 120*  CO2 25 26 27 28 28   GLUCOSE 137* 137* 156* 125* 107*  BUN 72* 69* 68* 69* 67*  CREATININE 2.40* 2.53* 2.58* 2.40* 2.19*  CALCIUM 8.5* 8.5* 8.5* 8.3* 8.0*  MG  --   --   --   --  2.1  PHOS   --   --   --   --  3.4    Studies: No results found.  Scheduled Meds: . arformoterol  15 mcg Nebulization BID  . budesonide (PULMICORT) nebulizer solution  0.25 mg Nebulization BID  . Chlorhexidine Gluconate Cloth  6 each Topical Daily  . feeding supplement (PRO-STAT SUGAR FREE 64)  30 mL Oral BID  . free water  200 mL Oral Q4H  . furosemide  40 mg Intravenous BID  . Gerhardt's butt cream   Topical QID  . heparin injection (subcutaneous)  5,000 Units Subcutaneous Q8H  . levothyroxine  100 mcg Intravenous Daily  . sodium chloride flush  10-40 mL Intracatheter Q12H   Continuous Infusions: . albumin human 12.5 g (01/01/20 1322)  . famotidine (PEPCID) IV 20 mg (01/01/20 1356)   PRN Meds: acetaminophen **OR** acetaminophen, albuterol, dextrose, ondansetron (ZOFRAN) IV, oxyCODONE  Time spent: 35 minutes  Author: Berle Mull, MD [facility] 01/01/2020 7:03 PM  To reach On-call, see care teams to locate the attending and reach out to them via [email or web address]. If 7PM-7AM, please contact night-coverage If you still have difficulty reaching the attending provider, please page the [facility] (Director on Call) for [facility] on amion for assistance.

## 2020-01-01 NOTE — Progress Notes (Signed)
  Speech Language Pathology Treatment: Dysphagia  Patient Details Name: [name] [name] MRN: [id number] DOB: May 31, 1946 Today's Date: 01/01/2020 Time: 3794-4461 SLP Time Calculation (min) (ACUTE ONLY): 14 min  Assessment / Plan / Recommendation Clinical Impression  Pt was seen for skilled ST targeting diagnostic treatment.  She was encountered awake/alert and she was requesting food/drinks upon SLP arrival.  Pt was seen with trial of thin liquid, nectar-thick liquid, and puree.  She required assistance with holding the cup, but she demonstrated good bolus acceptance with suspected timely AP transport during all trials.  Pt was noted to be impulsive, taking multiple large straw sips in a row.  She required verbal and tactile cues to limit bolus size and limit rate of intake.  A delayed throat clear was noted following 2/10 sips of thin liquid and after 1/5 sips of nectar-thick liquid, but vocal quality remained clear.  RN reported that pt was tolerating her diet well without difficulty.  Recommend diet upgrade to Dysphagia 1 (puree) solids and thin liquids with medications administered crushed in puree.  Pt will benefit from full supervision during meals to assist with PO intake and to cue for compensatory strategies (listed below).  SLP will f/u closely to monitor diet tolerance.      HPI: [name] [name] is a 74 y.o. female with medical history significant of COPD chronically on 2 L oxygen, chronic diastolic CHF, sleep apnea, paroxysmal A. fib, chronic anemia, CKD stage III with baseline creatinine of 1.3, sick sinus syndrome status post PPM, hypothyroidism presented with Fall.  Patient lives by herself, and she is morbidly obese and had a fall episode about 6 days ago.  Patient was not able to stand up herself and she stayed on lying for the last 6 days.  Head CT was negative for acute findings.        SLP Plan  Continue with current plan of care       Recommendations  Diet recommendations:  Dysphagia 1 (puree);Thin liquid Liquids provided via: Cup;Straw Medication Administration: Crushed with puree Supervision: Staff to assist with self feeding;Full supervision/cueing for compensatory strategies Compensations: Minimize environmental distractions;Slow rate;Small sips/bites Postural Changes and/or Swallow Maneuvers: Seated upright 90 degrees                Oral Care Recommendations: Oral care BID;Staff/trained caregiver to provide oral care Follow up Recommendations: Skilled Nursing facility SLP Visit Diagnosis: Dysphagia, unspecified (R13.10) Plan: Continue with current plan of care       [place]., M.S., [place] Office: [phone]  [place] 01/01/2020, 12:05 PM

## 2020-01-02 ENCOUNTER — Inpatient Hospital Stay (HOSPITAL_COMMUNITY): Payer: Medicare HMO

## 2020-01-02 DIAGNOSIS — L97511 Non-pressure chronic ulcer of other part of right foot limited to breakdown of skin: Secondary | ICD-10-CM | POA: Diagnosis not present

## 2020-01-02 DIAGNOSIS — J9621 Acute and chronic respiratory failure with hypoxia: Secondary | ICD-10-CM | POA: Diagnosis not present

## 2020-01-02 DIAGNOSIS — E440 Moderate protein-calorie malnutrition: Secondary | ICD-10-CM | POA: Diagnosis not present

## 2020-01-02 DIAGNOSIS — R7989 Other specified abnormal findings of blood chemistry: Secondary | ICD-10-CM | POA: Diagnosis not present

## 2020-01-02 LAB — CBC
HCT: 35.6 % — ABNORMAL LOW (ref 36.0–46.0)
Hemoglobin: 10.4 g/dL — ABNORMAL LOW (ref 12.0–15.0)
MCH: 30.6 pg (ref 26.0–34.0)
MCHC: 29.2 g/dL — ABNORMAL LOW (ref 30.0–36.0)
MCV: 104.7 fL — ABNORMAL HIGH (ref 80.0–100.0)
Platelets: 93 10*3/uL — ABNORMAL LOW (ref 150–400)
RBC: 3.4 MIL/uL — ABNORMAL LOW (ref 3.87–5.11)
RDW: 19.1 % — ABNORMAL HIGH (ref 11.5–15.5)
WBC: 5.1 10*3/uL (ref 4.0–10.5)
nRBC: 0.6 % — ABNORMAL HIGH (ref 0.0–0.2)

## 2020-01-02 LAB — COMPREHENSIVE METABOLIC PANEL
ALT: 36 U/L (ref 0–44)
AST: 33 U/L (ref 15–41)
Albumin: 3.2 g/dL — ABNORMAL LOW (ref 3.5–5.0)
Alkaline Phosphatase: 139 U/L — ABNORMAL HIGH (ref 38–126)
Anion gap: 11 (ref 5–15)
BUN: 73 mg/dL — ABNORMAL HIGH (ref 8–23)
CO2: 27 mmol/L (ref 22–32)
Calcium: 8.8 mg/dL — ABNORMAL LOW (ref 8.9–10.3)
Chloride: 118 mmol/L — ABNORMAL HIGH (ref 98–111)
Creatinine, Ser: 2.22 mg/dL — ABNORMAL HIGH (ref 0.44–1.00)
GFR calc Af Amer: 25 mL/min — ABNORMAL LOW (ref >60)
GFR calc non Af Amer: 21 mL/min — ABNORMAL LOW (ref >60)
Glucose, Bld: 99 mg/dL (ref 70–99)
Potassium: 3.4 mmol/L — ABNORMAL LOW (ref 3.5–5.1)
Sodium: 156 mmol/L — ABNORMAL HIGH (ref 135–145)
Total Bilirubin: 2 mg/dL — ABNORMAL HIGH (ref 0.3–1.2)
Total Protein: 6.4 g/dL — ABNORMAL LOW (ref 6.5–8.1)

## 2020-01-02 LAB — CK: Total CK: 167 U/L (ref 38–234)

## 2020-01-02 LAB — MAGNESIUM: Magnesium: 2.2 mg/dL (ref 1.7–2.4)

## 2020-01-02 LAB — PROTIME-INR
INR: 1.5 — ABNORMAL HIGH (ref 0.8–1.2)
Prothrombin Time: 17.9 seconds — ABNORMAL HIGH (ref 11.4–15.2)

## 2020-01-02 LAB — GLUCOSE, CAPILLARY
Glucose-Capillary: 130 mg/dL — ABNORMAL HIGH (ref 70–99)
Glucose-Capillary: 116 mg/dL — ABNORMAL HIGH (ref 70–99)
Glucose-Capillary: 130 mg/dL — ABNORMAL HIGH (ref 70–99)
Glucose-Capillary: 127 mg/dL — ABNORMAL HIGH (ref 70–99)

## 2020-01-02 MED ORDER — POTASSIUM CHLORIDE CRYS ER 20 MEQ PO TBCR
20.0000 meq | EXTENDED_RELEASE_TABLET | Freq: Once | ORAL | Status: AC
  Administered 2020-01-02: 20 meq via ORAL
  Filled 2020-01-02: qty 1

## 2020-01-02 NOTE — Progress Notes (Signed)
All dressings changed with Wilber Bihari, RN following orders. Measurements are as followed- Right forehead 7cm x 5cm x 0cm  Right elbow 6.5cm x 3.5cm x 0cm  Right shin 6cm x 5cm x 0cm Left shin 10cm x8cm x 0cm  Right plantar foot 2cm x 3.5cm x 0.4cm   Yair Dusza P Jeanae Whitmill

## 2020-01-02 NOTE — Progress Notes (Signed)
PROGRESS NOTE  [name] [name]  XTG:[id number] DOB: Feb 01, 1946 DOA: 01/09/2020 PCP: Biagio Borg, MD   Brief Narrative: [name] [name] is a 74 y.o. female with a history of 2L O2-dependent COPD, chronic HFpEF, OSA, morbid obesity, AFib, stage III CKD, hypothyroidism, SSS s/p PPM placement who was brought to the hospital after found down at home. Neighbors checked on her after they noted her mailbox was filling up and she had fallen, appeared confused with hypoxia and multiple areas of skin breakdown. She was found to have rhabdomyolysis with AKI.   Assessment & Plan: Active Problems:   LFT elevation   AMS (altered mental status)   Non-traumatic rhabdomyolysis   Acute on chronic respiratory failure with hypoxia and hypercapnia (HCC)   Non-pressure chronic ulcer of other part of right foot limited to breakdown of skin ([place])   Fall   Moderate protein-calorie malnutrition (HCC)   Idiopathic chronic gout without tophus  Rhabdomyolysis: CK improving. Not giving IVF due to overload.  - Mobilize as able.   AKI on stage IIIb CKD: Baseline creatinine ~1.3, though possibly showing progression with level of 1.46 in the previous month. Ultrasound renal unremarkable for any acute obstruction.  Ultrasound abdomen also unremarkable for any acute significant abnormality.   - Given albumin (with hypoalbuminemia) and lasix. Lasix given again today. Will monitor response, depending on po intake, may need to restart on fluids.   Hypernatremia.  Hyperkalemia.  Non-anion gap metabolic acidosis.  Hyperchloremia. Multiple electrolyte abnormality likely secondary to prolonged immobilization, no po intake.  - Continue monitoring with diuresis, Na up, will give free water and K down, will supplement  Acute metabolic encephalopathy Acute hypercarbic respiratory acidosis Acute hypoxic respiratory failure secondary to volume overload. CT head unremarkable.  Repeat CT scan unremarkable as well for any acute stroke.   Patient unable to get MRI secondary to pacemaker. Metabolic work-up shows elevated TSH but normal free T4. E70 normal.  Folic acid low normal.  LFTs elevated. ABG on 12/31/2019 shows evidence of hypercarbia as well as hypoxia. BiPAP as needed. CPAP nightly. - Wean supplemental oxygen as able with diuresis.  Elevated LFT. Likely shock liver and prolonged immobilization and as well as rhabdomyolysis. Hold hepatotoxic medication.  Ultrasound liver negative for any significant abnormality.  Shows gallbladder sludge.  Paroxysmal A. fib. Rate currently controlled. Acute on chronic chronic diastolic CHF. - Holding anticoagulation with no DVT on U/S and had some IV site bleeding.    Hypothyroidism:  - Continue IV synthroid. Likely can transition to oral after modified barium swallow scheduled on 01/02/2020.  Bilateral upper and lower extremity edema. Vascular Doppler ordered, no clot.  Dysphagia: Dysphagia 2 diet. In the setting of multifactorial encephalopathy. CT scan x2 - for any acute abnormality. - Appreciate SLP assistance.  Skin breakdown:  - Offloading as able, no indication for antibiotics of debridement. Orthopedics, Dr. Sharol Given evaluated the patient.   Morbid obesity: Body mass index is 46.99 kg/m.   OSA:  - Continue NIPPV qHS.   DVT prophylaxis: Heparin  Code Status: Full Family Communication: None at bedside.  Disposition Plan:  Status is: Inpatient  Remains inpatient appropriate because:Altered mental status and Inpatient level of care appropriate due to severity of illness  Dispo: The patient is from: Home              Anticipated d/c is to: SNF              Anticipated d/c date is: > 3 days  Patient currently is not medically stable to d/c.  Consultants:   Orthopedics  Wound care  PCCM  Procedures:   Noe Gens, NP 12/30/2019: Insertion of Central Venous Catheter Left IJ.  Antimicrobials:  Aztreonam, ceftriaxone, flagyl, vancomycin  4/12.    Subjective: Very hungry, irritated by BiPAP. Taken off and put on supplemental oxygen denies dyspnea but is still breathing heavily. No chest or other pain per patient.   Objective: Vitals:   01/02/20 1209 01/02/20 1516 01/02/20 1649 01/02/20 1721  BP: (!) 119/98  112/68 114/79  Pulse: 100  (!) 111 (!) 106  Resp: 18  (!) 26 20  Temp: 98.2 F (36.8 C)  98 F (36.7 C) 97.8 F (36.6 C)  TempSrc: Oral  Oral Oral  SpO2: 97% 92% 95% 96%  Weight:      Height:        Intake/Output Summary (Last 24 hours) at 01/02/2020 1817 Last data filed at 01/02/2020 1500 Gross per 24 hour  Intake 610 ml  Output 725 ml  Net -115 ml   Filed Weights   12/28/2019 1046  Weight: 136.1 kg    Gen: Ill-appearing obese female in no distress Pulm: Non-labored but tachypneic with upper airway transmission, no crackles or wheezes or stridor.  CV: Regular borderline tachycardia. No murmur, rub, or gallop. No definite JVD, contracted edema in LE's bilaterally GI: Abdomen soft, non-tender, non-distended, with normoactive bowel sounds. No organomegaly or masses felt. Ext: Warm, no deformities, boots bilaterally in place.  Skin: Foam pad dressings in place Right forehead 7cm x 5cm x 0cm  Right elbow 6.5cm x 3.5cm x 0cm  Right shin 6cm x 5cm x 0cm Left shin 10cm x8cm x 0cm  Right plantar foot 2cm x 3.5cm x 0.4cm  Neuro: Alert, no dysarthria, oriented to year, location. No focal neurological deficits. Psych: Judgement and insight appear marginal. Mood & affect appropriate.   Data Reviewed: I have personally reviewed following labs and imaging studies  CBC: Recent Labs  Lab 01/05/2020 1007 12/28/2019 1007 01/10/2020 1423 12/30/19 0329 12/31/19 0324 01/01/20 0420 01/02/20 0449  WBC 6.9  --   --  6.7 3.9* 4.2 5.1  NEUTROABS 5.5  --   --   --   --   --   --   HGB 12.9   < > 14.6 12.8 9.9* 10.4* 10.4*  HCT 44.9   < > 43.0 45.2 33.8* 36.5 35.6*  MCV 106.4*  --   --  108.4* 103.7* 106.7* 104.7*  PLT 171   --   --  142* 110* 110* 93*   < > = values in this interval not displayed.   Basic Metabolic Panel: Recent Labs  Lab 12/31/19 0217 12/31/19 0851 12/31/19 1700 01/01/20 0723 01/02/20 0449  NA 152* 152* 151* 155* 156*  K 4.3 4.2 3.9 3.2* 3.4*  CL 116* 117* 116* 120* 118*  CO2 26 27 28 28 27   GLUCOSE 137* 156* 125* 107* 99  BUN 69* 68* 69* 67* 73*  CREATININE 2.53* 2.58* 2.40* 2.19* 2.22*  CALCIUM 8.5* 8.5* 8.3* 8.0* 8.8*  MG  --   --   --  2.1 2.2  PHOS  --   --   --  3.4  --    GFR: Estimated Creatinine Clearance: 32.6 mL/min (A) (by C-G formula based on SCr of 2.22 mg/dL (H)). Liver Function Tests: Recent Labs  Lab 12/21/2019 1007 12/30/19 0329 12/31/19 0851 01/01/20 0723 01/02/20 0449  AST 115* 86* 52* 43* 33  ALT 55* 49* 40 35 36  ALKPHOS 155* 167* 124 119 139*  BILITOT 2.9* 2.7* 2.3* 2.0* 2.0*  PROT 6.3* 6.8 6.1* 5.5* 6.4*  ALBUMIN 3.3* 3.4* 2.8* 2.7* 3.2*   Recent Labs  Lab 12/23/2019 1007  LIPASE 19   Recent Labs  Lab 12/30/19 0816  AMMONIA 52*   Coagulation Profile: Recent Labs  Lab 01/02/20 0449  INR 1.5*   Cardiac Enzymes: Recent Labs  Lab 01/12/2020 1007 12/30/19 0329 12/31/19 0851 01/02/20 0449  CKTOTAL 2,047* 1,022* 430* 167   BNP (last 3 results) No results for input(s): PROBNP in the last 8760 hours. HbA1C: Recent Labs    12/31/19 0850  HGBA1C 5.1   CBG: Recent Labs  Lab 01/01/20 1549 01/01/20 2034 01/02/20 0605 01/02/20 1205 01/02/20 1645  GLUCAP 118* 126* 130* 116* 130*   Lipid Profile: No results for input(s): CHOL, HDL, LDLCALC, TRIG, CHOLHDL, LDLDIRECT in the last 72 hours. Thyroid Function Tests: No results for input(s): TSH, T4TOTAL, FREET4, T3FREE, THYROIDAB in the last 72 hours. Anemia Panel: No results for input(s): VITAMINB12, FOLATE, FERRITIN, TIBC, IRON, RETICCTPCT in the last 72 hours. Urine analysis:    Component Value Date/Time   COLORURINE AMBER (A) 01/16/2020 1309   APPEARANCEUR CLOUDY (A) 01/10/2020  1309   LABSPEC 1.012 01/15/2020 1309   PHURINE 5.0 12/27/2019 1309   GLUCOSEU NEGATIVE 01/08/2020 1309   GLUCOSEU NEGATIVE 11/26/2019 0825   HGBUR LARGE (A) 12/31/2019 1309   BILIRUBINUR NEGATIVE 12/23/2019 1309   KETONESUR NEGATIVE 01/05/2020 1309   PROTEINUR 100 (A) 12/20/2019 1309   UROBILINOGEN 0.2 11/26/2019 0825   NITRITE NEGATIVE 12/22/2019 1309   LEUKOCYTESUR NEGATIVE 12/23/2019 1309   Recent Results (from the past 240 hour(s))  SARS CORONAVIRUS 2 (TAT 6-24 HRS) Nasopharyngeal Urine, Clean Catch     Status: None   Collection Time: 12/27/2019 12:08 PM   Specimen: Urine, Clean Catch; Nasopharyngeal  Result Value Ref Range Status   SARS Coronavirus 2 NEGATIVE NEGATIVE Final    Comment: (NOTE) SARS-CoV-2 target nucleic acids are NOT DETECTED. The SARS-CoV-2 RNA is generally detectable in upper and lower respiratory specimens during the acute phase of infection. Negative results do not preclude SARS-CoV-2 infection, do not rule out co-infections with other pathogens, and should not be used as the sole basis for treatment or other patient management decisions. Negative results must be combined with clinical observations, patient history, and epidemiological information. The expected result is Negative. Fact Sheet for Patients: SugarRoll.be Fact Sheet for Healthcare Providers: https://www.woods-mathews.com/ This test is not yet approved or cleared by the [place] FDA and  has been authorized for detection and/or diagnosis of SARS-CoV-2 by FDA under an Emergency Use Authorization (EUA). This EUA will remain  in effect (meaning this test can be used) for the duration of the COVID-19 declaration under Section 56 4(b)(1) of the Act, 21 U.S.C. section 360bbb-3(b)(1), unless the authorization is terminated or revoked sooner. Performed at [facility], [place] [address] [place], [place] [address]   Urine culture     Status: None    Collection Time: 01/14/2020  1:09 PM   Specimen: Urine, Random  Result Value Ref Range Status   Specimen Description URINE, RANDOM  Final   Special Requests NONE  Final   Culture   Final    NO GROWTH Performed at [facility], [place] [address], [place], [place] [address]    Report Status 12/30/2019 FINAL  Final      Radiology Studies: DG  CHEST PORT 1 VIEW  Result Date: 01/02/2020 CLINICAL DATA:  Acute respiratory failure with hypoxia. EXAM: PORTABLE CHEST 1 VIEW COMPARISON:  December 31, 2019. FINDINGS: Stable cardiomegaly. No pneumothorax or pleural effusion is noted. Lungs are clear. Left internal jugular catheter is unchanged in position. Left-sided pacemaker is unchanged. Bony thorax is unremarkable. IMPRESSION: No acute cardiopulmonary abnormality seen. Electronically Signed   By: Marijo Conception M.D.   On: 01/02/2020 09:36    Scheduled Meds: . arformoterol  15 mcg Nebulization BID  . budesonide (PULMICORT) nebulizer solution  0.25 mg Nebulization BID  . Chlorhexidine Gluconate Cloth  6 each Topical Daily  . feeding supplement (PRO-STAT SUGAR FREE 64)  30 mL Oral BID  . free water  200 mL Oral Q4H  . furosemide  40 mg Intravenous BID  . Gerhardt's butt cream   Topical QID  . heparin injection (subcutaneous)  5,000 Units Subcutaneous Q8H  . levothyroxine  100 mcg Intravenous Daily  . potassium chloride  20 mEq Oral Once  . sodium chloride flush  10-40 mL Intracatheter Q12H   Continuous Infusions: . famotidine (PEPCID) IV 20 mg (01/02/20 1343)     LOS: 4 days   Time spent: 35 minutes.  Patrecia Pour, MD [facility] [email or web address] 01/02/2020, 6:17 PM

## 2020-01-02 NOTE — Evaluation (Signed)
Physical Therapy Evaluation Patient Details Name: [name] [name] MRN: [id number] DOB: 06-03-1946 Today's Date: 01/02/2020   History of Present Illness  Past medical history of COPD on 2 LPM chronically, chronic respiratory failure, chronic CHF, OSA, A. fib, anemia, CKD 3, hypothyroidism, PPM implant.  Presented with a fall that occurred on Easter Sunday.  Patient lives alone and neighbors noted mailbox was full and packages were not picked up.  Significantly confused with multiple skin breakdowns and hypoxia on arrival.  Found to have acute kidney injury, hyperkalemia, hyponatremia, rhabdomyolysis and hypoxia.  Clinical Impression   Pt presents with severe pain with mobility especially RLE, severe dyspnea on exertion with respiratory breathing noted with any exertion, total assist +2 for all mobility at this time, significant body-wide weakness, impaired cognition, and decreased activity tolerance. Pt to benefit from acute PT to address deficits. Pt contributed 0-10% physically with rolling bilaterally and scooting up in bed this session, main participation in pt crossing arms during scooting. Pt very limited by respiratory status and confusion this session, along with severe pain. PT recommending SNF level of care post-acutely. PT to progress mobility as tolerated, and will continue to follow acutely.   SpO2: 82-94%, requires 5LO2 RR: 20-35 breaths/min HRmax: 111 bpm    Follow Up Recommendations SNF;Supervision/Assistance - 24 hour    Equipment Recommendations  None recommended by PT    Recommendations for Other Services       Precautions / Restrictions Precautions Precautions: Fall Precaution Comments: multiple areas of skin breakdown Required Braces or Orthoses: Other Brace Other Brace: B prevalon boots Restrictions Weight Bearing Restrictions: No      Mobility  Bed Mobility Overal bed mobility: Needs Assistance Bed Mobility: Rolling Rolling: Total assist;+2 for physical  assistance         General bed mobility comments: total +2 for rolling bilaterally for changing bed pads, scooting up in bed with use of bed pads and boost. Desats to 82% on 5LO2 with bed mobility, pt instructed in breathing technique (in through nose, out through mouth) for recovery of sats >90%.  Transfers                 General transfer comment: Will need [place]  Ambulation/Gait                Stairs            Wheelchair Mobility    Modified Rankin (Stroke Patients Only)       Balance Overall balance assessment: Needs assistance   Sitting balance-Leahy Scale: Poor Sitting balance - Comments: leaning L in bed; attmepting to reposition                                     Pertinent Vitals/Pain Pain Assessment: Faces Faces Pain Scale: Hurts whole lot Pain Location: crying out when turned in bed Pain Descriptors / Indicators: Discomfort;Grimacing;Guarding;Moaning Pain Intervention(s): Limited activity within patient's tolerance;Monitored during session;Repositioned    Home Living Family/patient expects to be discharged to:: Private residence Living Arrangements: Alone Available Help at Discharge: Family;Available PRN/intermittently Type of Home: House Home Access: Stairs to enter Entrance Stairs-Rails: Right Entrance Stairs-Number of Steps: 2 Home Layout: One level Home Equipment: Walker - 2 wheels;Walker - 4 wheels;Cane - quad;Cane - single point;Bedside commode;Shower seat;Grab bars - tub/shower;Hand held shower head Additional Comments: 2L O2 at home    Prior Function Level of Independence: Independent with assistive device(s)  Comments: pt says she is mostly independent at baseline (but that most things take extra time) with use of rollator. Daughter drives and assists with errands - information from last admission. Pt states things are more difficult now but she only needs help with transportation. Per SW note,  there was fecal matter, used disposable underwear adn garbage around the house.      Hand Dominance   Dominant Hand: Right    Extremity/Trunk Assessment   Upper Extremity Assessment Upper Extremity Assessment: Generalized weakness    Lower Extremity Assessment Lower Extremity Assessment: RLE deficits/detail;LLE deficits/detail RLE Deficits / Details: 1/5 hip flexion observed when attempting to lift LEs, unable to assess other ROM due to pt pain presentation on eval RLE: Unable to fully assess due to pain LLE Deficits / Details: 1/5 hip flexion observed when attempting to lift LEs, unable to assess other ROM due to pt pain presentation on eval LLE: Unable to fully assess due to pain    Cervical / Trunk Assessment Cervical / Trunk Assessment: Other exceptions Cervical / Trunk Exceptions: obesity  Communication   Communication: No difficulties  Cognition Arousal/Alertness: Lethargic Behavior During Therapy: Flat affect Overall Cognitive Status: Impaired/Different from baseline Area of Impairment: Orientation;Attention;Memory;Following commands;Safety/judgement;Awareness;Problem solving                 Orientation Level: Disoriented to;Situation;Time Current Attention Level: Sustained Memory: Decreased short-term memory Following Commands: Follows one step commands with increased time Safety/Judgement: Decreased awareness of safety;Decreased awareness of deficits Awareness: Emergent Problem Solving: Slow processing;Decreased initiation General Comments: When asked what happened, pt would state "I already know, why do you keep asking me?" but could not state fall with sustained injuries without prompting. Pt states she feels she could care for self at home in present condition, but requires total +2 for mobility      General Comments General comments (skin integrity, edema, etc.): skin breakdown R>L multiple areas secondary to prolonged period on ground. Required 5LO2 to  maintain sats, RRmax 35 breaths/min, HRmax 111 bpm.    Exercises     Assessment/Plan    PT Assessment Patient needs continued PT services  PT Problem List Decreased strength;Decreased mobility;Decreased safety awareness;Decreased range of motion;Decreased activity tolerance;Decreased cognition;Cardiopulmonary status limiting activity;Decreased balance;Decreased knowledge of use of DME;Pain;Obesity       PT Treatment Interventions DME instruction;Therapeutic activities;Therapeutic exercise;Gait training;Patient/family education;Balance training;Functional mobility training;Neuromuscular re-education    PT Goals (Current goals can be found in the Care Plan section)  Acute Rehab PT Goals Patient Stated Goal: to get a Coke PT Goal Formulation: With patient Time For Goal Achievement: 01/16/20 Potential to Achieve Goals: Good    Frequency Min 2X/week   Barriers to discharge        Co-evaluation PT/OT/SLP Co-Evaluation/Treatment: Yes Reason for Co-Treatment: Complexity of the patient's impairments (multi-system involvement);Necessary to address cognition/behavior during functional activity;For patient/therapist safety PT goals addressed during session: Mobility/safety with mobility OT goals addressed during session: ADL's and self-care       AM-PAC PT "6 Clicks" Mobility  Outcome Measure Help needed turning from your back to your side while in a flat bed without using bedrails?: Total Help needed moving from lying on your back to sitting on the side of a flat bed without using bedrails?: Total Help needed moving to and from a bed to a chair (including a wheelchair)?: Total Help needed standing up from a chair using your arms (e.g., wheelchair or bedside chair)?: Total Help needed to walk in hospital room?: Total  Help needed climbing 3-5 steps with a railing? : Total 6 Click Score: 6    End of Session Equipment Utilized During Treatment: Oxygen Activity Tolerance: Patient  limited by fatigue;Patient limited by pain Patient left: in bed;with call bell/phone within reach;with bed alarm set;Other (comment)(prevalon boots reapplied) Nurse Communication: Mobility status;Other (comment)(O2 requirement during session) PT Visit Diagnosis: Other abnormalities of gait and mobility (R26.89);Muscle weakness (generalized) (M62.81);History of falling (Z91.81)    Time: 6840-3353 PT Time Calculation (min) (ACUTE ONLY): 20 min   Charges:   PT Evaluation $PT Eval Low Complexity: 1 Low         Zymeir Salminen E, PT Acute Rehabilitation Services Pager [phone]  Office [phone]  Venisa Frampton D Elonda Husky 01/02/2020, 5:16 PM

## 2020-01-02 NOTE — Progress Notes (Signed)
Called to assess L IJ TL for lack of blood return and sluggish flush. CXR shows tip in small vein off of the left inominate. Murray Hodgkins RN notified to contact MD regarding this finding and subsequent dysfuntion.  Aldona Lento RN VAST Team

## 2020-01-02 NOTE — Progress Notes (Signed)
Occupational Therapy Evaluation Patient Details Name: [name] [name] MRN: [id number] DOB: [birth year] Today's Date: 01/02/2020    History of Present Illness Past medical history of COPD on 2 LPM chronically, chronic respiratory failure, chronic CHF, OSA, A. fib, anemia, CKD 3, hypothyroidism, PPM implant.  Presented with a fall that occurred on Easter Sunday.  Patient lives alone and neighbors noted mailbox was full and packages were not picked up.  Significantly confused with multiple skin breakdowns and hypoxia on arrival.  Found to have acute kidney injury, hyperkalemia, hyponatremia, rhabdomyolysis and hypoxia.   Clinical Impression   PTA, pt living alone and per SW note, not able to care for herself. Pt seen on previous admission and SNF recommended at that time however pt apparently declined SNF. She also declined [facility] services.  Pt seen on 4L O2 with 3/4 DOE at times with SpO2 low 80s with good pleth. Nsg increased O2 to 7L. Pt required total A +2 to roll in bed to change soiled bed pad. Pt unable to mobilize OOB at this time due to weakness and WOB with exertion. Pt overall total A with LB ADL. Pt continues to think she can go home and take care of herself alone. Pt will benefit from rehab at SNF due to deficits listed below and is not safe to DC home at this time. Will follow acutely.     Follow Up Recommendations  SNF;Supervision/Assistance - 24 hour    Equipment Recommendations  Other (comment)(TBA)    Recommendations for Other Services       Precautions / Restrictions Precautions Precautions: Fall Precaution Comments: multiple areas of skin breakdown Required Braces or Orthoses: Other Brace Other Brace: B prevalon boots      Mobility Bed Mobility Overal bed mobility: Needs Assistance Bed Mobility: Rolling Rolling: Total assist;+2 for physical assistance            Transfers                 General transfer comment: Will need [place]ove    Balance Overall  balance assessment: Needs assistance   Sitting balance-Leahy Scale: Poor Sitting balance - Comments: leaning L in bed; attmepting to reposition                                   ADL either performed or assessed with clinical judgement   ADL Overall ADL's : Needs assistance/impaired Eating/Feeding: Set up;Supervision/ safety;Sitting   Grooming: Set up;Supervision/safety;Sitting   Upper Body Bathing: Minimal assistance;Sitting;Bed level   Lower Body Bathing: Maximal assistance;Bed level   Upper Body Dressing : Moderate assistance;Bed level   Lower Body Dressing: Total assistance;Bed level       Toileting- Clothing Manipulation and Hygiene: Total assistance Toileting - Clothing Manipulation Details (indicate cue type and reason): incontinenet     Functional mobility during ADLs: +2 for physical assistance;Maximal assistance General ADL Comments: total A +2 to turn. Pt says that she can turn but does not initiate; Assists with pulling forward     Vision         Perception     Praxis      Pertinent Vitals/Pain Pain Assessment: Faces Faces Pain Scale: Hurts whole lot Pain Location: crying out when turned in bed Pain Descriptors / Indicators: Discomfort;Grimacing;Guarding;Moaning Pain Intervention(s): Limited activity within patient's tolerance;Repositioned     Hand Dominance Right   Extremity/Trunk Assessment Upper Extremity Assessment Upper Extremity Assessment: Generalized weakness  Lower Extremity Assessment Lower Extremity Assessment: Defer to PT evaluation   Cervical / Trunk Assessment Cervical / Trunk Assessment: Other exceptions(morbidly obsese)   Communication Communication Communication: No difficulties   Cognition Arousal/Alertness: Lethargic Behavior During Therapy: Flat affect Overall Cognitive Status: Impaired/Different from baseline Area of Impairment: Orientation;Attention;Memory;Following  commands;Safety/judgement;Awareness;Problem solving                 Orientation Level: Disoriented to;Situation;Time Current Attention Level: Sustained Memory: Decreased short-term memory Following Commands: Follows one step commands with increased time Safety/Judgement: Decreased awareness of safety;Decreased awareness of deficits Awareness: Emergent Problem Solving: Slow processing;Decreased initiation     General Comments  multiple areas of skin breakdown/bandages     Exercises     Shoulder Instructions      Home Living Family/patient expects to be discharged to:: Private residence Living Arrangements: Alone Available Help at Discharge: Family;Available PRN/intermittently Type of Home: House Home Access: Stairs to enter [employer] of Steps: 2 Entrance Stairs-Rails: Right Home Layout: One level     Bathroom Shower/Tub: Teacher, early years/pre: Standard Bathroom Accessibility: Yes How Accessible: Accessible via walker Home Equipment: [place] - 2 wheels;Walker - 4 wheels;Cane - quad;Cane - single point;Bedside commode;Shower seat;Grab bars - tub/shower;Hand held shower head   Additional Comments: 2L O2 at home      Prior Functioning/Environment Level of Independence: Independent with assistive device(s)        Comments: pt says she is mostly independent at baseline (but that most things take extra time) with use of rollator. Daughter drives and assists with errands - information from last admission. Pt states things are more difficult now but she only needs help with transportation. Per SW note, there was fecal matter, used disposable underwear adn garbage around the house.         OT Problem List: Decreased strength;Decreased range of motion;Decreased activity tolerance;Impaired balance (sitting and/or standing);Decreased coordination;Decreased cognition;Decreased safety awareness;Decreased knowledge of use of DME or AE;Cardiopulmonary  status limiting activity;Obesity;Impaired UE functional use;Increased edema;Pain      OT Treatment/Interventions: Self-care/ADL training;Therapeutic exercise;DME and/or AE instruction;Energy conservation;Therapeutic activities;Cognitive remediation/compensation;Patient/family education;Balance training    OT Goals(Current goals can be found in the care plan section) Acute Rehab OT Goals Patient Stated Goal: to get a Coke OT Goal Formulation: Patient unable to participate in goal setting Time For Goal Achievement: 01/16/20 Potential to Achieve Goals: Fair  OT Frequency: Min 2X/week   Barriers to D/C:            Co-evaluation PT/OT/SLP Co-Evaluation/Treatment: Yes Reason for Co-Treatment: Complexity of the patient's impairments (multi-system involvement);Necessary to address cognition/behavior during functional activity;For patient/therapist safety   OT goals addressed during session: ADL's and self-care      AM-PAC OT "6 Clicks" Daily Activity     Outcome Measure Help from another person eating meals?: A Little Help from another person taking care of personal grooming?: A Little Help from another person toileting, which includes using toliet, bedpan, or urinal?: Total Help from another person bathing (including washing, rinsing, drying)?: A Lot Help from another person to put on and taking off regular upper body clothing?: A Lot Help from another person to put on and taking off regular lower body clothing?: Total 6 Click Score: 12   End of Session Equipment Utilized During Treatment: Oxygen(4-7L) Nurse Communication: Mobility status;Other (comment)(skin breakdown on buttocks)  Activity Tolerance: Patient limited by lethargy;Patient limited by pain;Patient limited by fatigue Patient left: in bed;with call bell/phone within reach;with bed alarm set  OT Visit Diagnosis: Other abnormalities of gait and mobility (R26.89);Repeated falls (R29.6);Muscle weakness (generalized)  (M62.81);Other symptoms and signs involving cognitive function;Pain Pain - part of body: Arm;Hip;Leg;Knee(buttocks; general discomfort)                Time: 1530-1600 OT Time Calculation (min): 30 min Charges:  OT General Charges $OT Visit: 1 Visit OT Evaluation $OT Eval Moderate Complexity: Lamar, OT/L   Acute OT Clinical Specialist Acute Rehabilitation Services Pager [phone] Office [phone]   [facility] 01/02/2020, 4:11 PM

## 2020-01-02 NOTE — Progress Notes (Signed)
When I arrived to room, patient had been taken off BiPAP. Appears to be breathing ok. Stated she didn't want BiPAP at the moment, and is begging for food. Will talk to RN

## 2020-01-02 NOTE — Progress Notes (Signed)
Patient is very positional, her hand has to be straight for the pulse ox to work properly. If not her sats show in the upper 79's.

## 2020-01-02 NOTE — Progress Notes (Signed)
  Speech Language Pathology Treatment: Dysphagia  Patient Details Name: [name] [name] MRN: [id number] DOB: 1946/08/12 Today's Date: 01/02/2020 Time: 1500-1510 SLP Time Calculation (min) (ACUTE ONLY): 10 min  Assessment / Plan / Recommendation Clinical Impression  Pt has tolerated upgraded diet and today tolerated sips of thin liquids from a straw with 2-3 sips per breath with no throat clearing or signs of asrpiation. Pt also given trials of upgraded solids texture with no deficits other than general respiratory fatigue during and after very small snack. Pt had to take rest breaks between bites to reduce RR and increase sp 02. Recommend advancing diet to dys 2 (fine chop) to continue to focus on energy consumption and decrease effort with meals. Close precautions to position pt and slow rate of intake still needed. Will f/u for tolerance and further advancement.   HPI HPI: [name] [name] is a 74 y.o. female with medical history significant of COPD chronically on 2 L oxygen, chronic diastolic CHF, sleep apnea, paroxysmal A. fib, chronic anemia, CKD stage III with baseline creatinine of 1.3, sick sinus syndrome status post PPM, hypothyroidism presented with Fall.  Patient lives by herself, and she is morbidly obese and had a fall episode about 6 days ago.  Patient was not able to stand up herself and she stayed on lying for the last 6 days.  Head CT was negative for acute findings.        SLP Plan  Continue with current plan of care       Recommendations  Diet recommendations: Dysphagia 2 (fine chop);Thin liquid Liquids provided via: Cup;Straw Medication Administration: Crushed with puree Supervision: Full supervision/cueing for compensatory strategies;Staff to assist with self feeding Compensations: Slow rate;Small sips/bites Postural Changes and/or Swallow Maneuvers: Seated upright 90 degrees                Oral Care Recommendations: Oral care BID;Staff/trained caregiver to provide  oral care Follow up Recommendations: Skilled Nursing facility SLP Visit Diagnosis: Dysphagia, unspecified (R13.10) Plan: Continue with current plan of care       GO              Herbie Baltimore, MA [place] Pager [phone] Office [phone]   Lynann Beaver 01/02/2020, 3:26 PM

## 2020-01-03 ENCOUNTER — Inpatient Hospital Stay (HOSPITAL_COMMUNITY): Payer: Medicare HMO

## 2020-01-03 DIAGNOSIS — R7989 Other specified abnormal findings of blood chemistry: Secondary | ICD-10-CM | POA: Diagnosis not present

## 2020-01-03 DIAGNOSIS — L97511 Non-pressure chronic ulcer of other part of right foot limited to breakdown of skin: Secondary | ICD-10-CM | POA: Diagnosis not present

## 2020-01-03 DIAGNOSIS — N179 Acute kidney failure, unspecified: Secondary | ICD-10-CM | POA: Diagnosis not present

## 2020-01-03 DIAGNOSIS — J9621 Acute and chronic respiratory failure with hypoxia: Secondary | ICD-10-CM

## 2020-01-03 DIAGNOSIS — E440 Moderate protein-calorie malnutrition: Secondary | ICD-10-CM | POA: Diagnosis not present

## 2020-01-03 DIAGNOSIS — J9622 Acute and chronic respiratory failure with hypercapnia: Secondary | ICD-10-CM

## 2020-01-03 DIAGNOSIS — M6282 Rhabdomyolysis: Secondary | ICD-10-CM | POA: Diagnosis not present

## 2020-01-03 LAB — POCT I-STAT 7, (LYTES, BLD GAS, ICA,H+H)
Acid-Base Excess: 2 mmol/L (ref 0.0–2.0)
Bicarbonate: 28.7 mmol/L — ABNORMAL HIGH (ref 20.0–28.0)
Calcium, Ion: 1.34 mmol/L (ref 1.15–1.40)
HCT: 35 % — ABNORMAL LOW (ref 36.0–46.0)
Hemoglobin: 11.9 g/dL — ABNORMAL LOW (ref 12.0–15.0)
O2 Saturation: 98 %
Potassium: 3.7 mmol/L (ref 3.5–5.1)
Sodium: 154 mmol/L — ABNORMAL HIGH (ref 135–145)
TCO2: 30 mmol/L (ref 22–32)
pCO2 arterial: 53.1 mmHg — ABNORMAL HIGH (ref 32.0–48.0)
pH, Arterial: 7.341 — ABNORMAL LOW (ref 7.350–7.450)
pO2, Arterial: 109 mmHg — ABNORMAL HIGH (ref 83.0–108.0)

## 2020-01-03 LAB — COMPREHENSIVE METABOLIC PANEL
ALT: 33 U/L (ref 0–44)
AST: 28 U/L (ref 15–41)
Albumin: 3.1 g/dL — ABNORMAL LOW (ref 3.5–5.0)
Alkaline Phosphatase: 129 U/L — ABNORMAL HIGH (ref 38–126)
Anion gap: 13 (ref 5–15)
BUN: 79 mg/dL — ABNORMAL HIGH (ref 8–23)
CO2: 25 mmol/L (ref 22–32)
Calcium: 9.1 mg/dL (ref 8.9–10.3)
Chloride: 116 mmol/L — ABNORMAL HIGH (ref 98–111)
Creatinine, Ser: 2.22 mg/dL — ABNORMAL HIGH (ref 0.44–1.00)
GFR calc Af Amer: 25 mL/min — ABNORMAL LOW (ref >60)
GFR calc non Af Amer: 21 mL/min — ABNORMAL LOW (ref >60)
Glucose, Bld: 104 mg/dL — ABNORMAL HIGH (ref 70–99)
Potassium: 3.8 mmol/L (ref 3.5–5.1)
Sodium: 154 mmol/L — ABNORMAL HIGH (ref 135–145)
Total Bilirubin: 1.8 mg/dL — ABNORMAL HIGH (ref 0.3–1.2)
Total Protein: 6.1 g/dL — ABNORMAL LOW (ref 6.5–8.1)

## 2020-01-03 LAB — CBC
HCT: 37.4 % (ref 36.0–46.0)
Hemoglobin: 10.7 g/dL — ABNORMAL LOW (ref 12.0–15.0)
MCH: 30 pg (ref 26.0–34.0)
MCHC: 28.6 g/dL — ABNORMAL LOW (ref 30.0–36.0)
MCV: 104.8 fL — ABNORMAL HIGH (ref 80.0–100.0)
Platelets: 89 10*3/uL — ABNORMAL LOW (ref 150–400)
RBC: 3.57 MIL/uL — ABNORMAL LOW (ref 3.87–5.11)
RDW: 19.2 % — ABNORMAL HIGH (ref 11.5–15.5)
WBC: 5.8 10*3/uL (ref 4.0–10.5)
nRBC: 0.5 % — ABNORMAL HIGH (ref 0.0–0.2)

## 2020-01-03 LAB — BLOOD GAS, ARTERIAL
Acid-Base Excess: 2.2 mmol/L — ABNORMAL HIGH (ref 0.0–2.0)
Bicarbonate: 27.4 mmol/L (ref 20.0–28.0)
Drawn by: 39898
FIO2: 40
O2 Saturation: 97 %
Patient temperature: 36.7
pCO2 arterial: 51.6 mmHg — ABNORMAL HIGH (ref 32.0–48.0)
pH, Arterial: 7.343 — ABNORMAL LOW (ref 7.350–7.450)
pO2, Arterial: 93.7 mmHg (ref 83.0–108.0)

## 2020-01-03 LAB — GLUCOSE, CAPILLARY
Glucose-Capillary: 94 mg/dL (ref 70–99)
Glucose-Capillary: 105 mg/dL — ABNORMAL HIGH (ref 70–99)
Glucose-Capillary: 105 mg/dL — ABNORMAL HIGH (ref 70–99)
Glucose-Capillary: 77 mg/dL (ref 70–99)
Glucose-Capillary: 72 mg/dL (ref 70–99)

## 2020-01-03 LAB — MRSA PCR SCREENING: MRSA by PCR: POSITIVE — AB

## 2020-01-03 LAB — BRAIN NATRIURETIC PEPTIDE: B Natriuretic Peptide: 1968.7 pg/mL — ABNORMAL HIGH (ref 0.0–100.0)

## 2020-01-03 MED ORDER — HEPARIN BOLUS VIA INFUSION
2350.0000 [IU] | Freq: Once | INTRAVENOUS | Status: AC
  Administered 2020-01-03: 22:00:00 2350 [IU] via INTRAVENOUS
  Filled 2020-01-03: qty 2350

## 2020-01-03 MED ORDER — FENTANYL CITRATE (PF) 100 MCG/2ML IJ SOLN
INTRAMUSCULAR | Status: AC
  Administered 2020-01-03: 50 ug via INTRAVENOUS
  Filled 2020-01-03: qty 2

## 2020-01-03 MED ORDER — MIDAZOLAM HCL 2 MG/2ML IJ SOLN
INTRAMUSCULAR | Status: AC
  Filled 2020-01-03: qty 2

## 2020-01-03 MED ORDER — HEPARIN (PORCINE) 25000 UT/250ML-% IV SOLN
1200.0000 [IU]/h | INTRAVENOUS | Status: AC
  Administered 2020-01-03: 1300 [IU]/h via INTRAVENOUS
  Administered 2020-01-04: 1200 [IU]/h via INTRAVENOUS
  Filled 2020-01-03 (×2): qty 250

## 2020-01-03 MED ORDER — FREE WATER: 300.0000 mL | Status: DC

## 2020-01-03 MED ORDER — FENTANYL CITRATE (PF) 100 MCG/2ML IJ SOLN: 50.0000 ug | Freq: Once | INTRAMUSCULAR | Status: AC

## 2020-01-03 MED ORDER — DEXTROSE 5 % IV SOLN
500.0000 mg | Freq: Two times a day (BID) | INTRAVENOUS | Status: AC
  Administered 2020-01-03 – 2020-01-04 (×3): 500 mg via INTRAVENOUS
  Filled 2020-01-03 (×3): qty 500

## 2020-01-03 NOTE — Progress Notes (Signed)
Pt states she wears CPAP 7.5cmH20 at home with FFM. Settings changed from BIPAP(which is PRN) to CPAP(which is QHS) 8cmH20, 40%. Pt appears to be resting comfortably on this setting, and SpO2 currently 95%. RT will continue to monitor.

## 2020-01-03 NOTE — Progress Notes (Signed)
[facility] Progress Note Patient Name: [name] [name] DOB: June 05, 1946 MRN: [id number]   Date of Service  01/03/2020  HPI/Events of Note  Reviewed CXR from [facility] on 01/03/20. No complications apparent from central line placement.   eICU Interventions   R IJ central line OK to use (order entered).     Intervention Category Intermediate Interventions: Diagnostic test evaluation  Marily Lente Jamicheal Heard 01/03/2020, 7:40 PM

## 2020-01-03 NOTE — Progress Notes (Addendum)
Pt originally placed on CPAP 8cmH20 40% tonight, but was not achieving adequate volumes, and SpO2 continuously dropping in the upper 80's. Pt switched to previous BIPAP settings 16/8 RR 12 50%, and is achieving better volumes, improved SpO2 and HR is lower. Pt overall looks better on BIPAP, so will continue with this mode for the rest of the night. Charge nurse notified of changes. RT will continue to monitor.

## 2020-01-03 NOTE — Progress Notes (Signed)
Called report to 55M nurse, Lexi.  Pt transported with RT at bedside w/pt on bipap.

## 2020-01-03 NOTE — Progress Notes (Signed)
Stopped fluids to L IJ.. per md ccm to eval for replacement central line

## 2020-01-03 NOTE — Progress Notes (Signed)
PROGRESS NOTE  [name]  EVO:[id number] DOB: 01/17/1946 DOA: 01/06/2020 PCP: Biagio Borg, MD   Brief Narrative: [name] [name] is a 74 y.o. female with a history of 2L O2-dependent COPD, chronic HFpEF, OSA, morbid obesity, AFib, stage III CKD, hypothyroidism, SSS s/p PPM placement who was brought to the hospital after found down at home. Neighbors checked on her after they noted her mailbox was filling up and she had fallen, appeared confused with hypoxia and multiple areas of skin breakdown. She was found to have rhabdomyolysis with AKI as well as respiratory distress thought to be due to volume overload. Diuresis was started and the patient has intermittently required HFNC and daytime NIPPV. Due to poor venous access, IJ was placed.   Assessment & Plan: Active Problems:   LFT elevation   AMS (altered mental status)   Non-traumatic rhabdomyolysis   Acute on chronic respiratory failure with hypoxia and hypercapnia (HCC)   Non-pressure chronic ulcer of other part of right foot limited to breakdown of skin ([place])   Fall   Moderate protein-calorie malnutrition (HCC)   Idiopathic chronic gout without tophus  Rhabdomyolysis: CK improved. Not giving IVF due to overload.  - Mobilize as able.   AKI on stage IIIb CKD: Baseline creatinine ~1.3, though possibly showing progression with level of 1.46 in the previous month. Renal U/S unremarkable. Ultrasound abdomen also unremarkable for any acute significant abnormality.   - Creatinine remains elevated but withstanding diuresis. Urine output is falling off. Prefer to minimize nephrotoxins including contrast if possible.  Acute on chronic chronic diastolic CHF. - Given albumin (with hypoalbuminemia) and lasix previously, continuing diuresis this morning. Without pulmonary edema by exam or CXR, unclear if this remains necessary. BNP is grossly elevated despite her obesity which argues for continued diuresis. Will monitor response, depending on po  intake, may need to restart on fluids.   Hypernatremia.  Hyperkalemia.  Non-anion gap metabolic acidosis.  Hyperchloremia. Multiple electrolyte abnormality likely secondary to prolonged immobilization, no po intake.  - Continue monitoring with diuresis, Na up, we've been giving free water.  Acute metabolic encephalopathy: CT x2 unrevealing, TSH mildly elevated with normal T4, unlikely to be clinically salient. E99 normal.  Folic acid low normal. - Delirium precautions   Acute on chronic hypoxic and hypercarbic respiratory failure, OSA, CHF, COPD:  - Repeat ABG - Will ask for assistance from PCCM - Wean supplemental oxygen as able with diuresis. - ? if CTA would be of benefit, high risk w/renal insufficiency.  Elevated LFT: Likely related to rhabdomyolysis, improving consistently. Ultrasound liver negative for any significant abnormality.  Shows gallbladder sludge. - Continue monitoring intermittently  Paroxysmal atrial fibrillation: Rate currently controlled.  - On ppx heparin, Will restart anticoagulation pending PCCM evaluation.   Hypothyroidism:  - Continue synthroid.  Bilateral upper and lower extremity edema:  - No DVT on U/S  Dysphagia: Dysphagia 2 diet. In the setting of multifactorial encephalopathy. CT scan x2 without CVA.  - Appreciate SLP assistance.  Skin breakdown:  - Offloading as able, no indication for antibiotics of debridement. Orthopedics, Dr. Sharol Given evaluated the patient.   Morbid obesity: Body mass index is 46.99 kg/m.   OSA:  - Continue NIPPV qHS.   DVT prophylaxis: Heparin  Code Status: Full Family Communication: None at bedside.  Disposition Plan:  Status is: Inpatient  Remains inpatient appropriate because:Altered mental status and Inpatient level of care appropriate due to severity of illness  Dispo: The patient is from: Home  Anticipated d/c is to: SNF              Anticipated d/c date is: > 3 days              Patient  currently is not medically stable to d/c.  Consultants:   Orthopedics  Wound care  PCCM  Procedures:   Noe Gens, NP 12/30/2019: Insertion of Central Venous Catheter Left IJ.  Antimicrobials:  Aztreonam, ceftriaxone, flagyl, vancomycin 4/12.    Subjective: Was alert this morning, eating breakfast, now a bit more short of breath. Denies chest pain. No wheezing.   Objective: Vitals:   01/03/20 0918 01/03/20 1156 01/03/20 1300 01/03/20 1523  BP: 112/60  117/63   Pulse: 81   (!) 105  Resp: 18   (!) 23  Temp: (!) 97.4 F (36.3 C)     TempSrc: Oral     SpO2: 96% 90%  99%  Weight:      Height:        Intake/Output Summary (Last 24 hours) at 01/03/2020 1531 Last data filed at 01/03/2020 0900 Gross per 24 hour  Intake 580 ml  Output --  Net 580 ml   Filed Weights   12/31/2019 1046  Weight: 136.1 kg   Gen: 74 y.o. female in no distress Pulm: Nonlabored while sleeping with HFNC in place, 100%. When awakened, becomes more tachypneic. No wheezes or crackles. CV: Regular rate and rhythm. No murmur, rub, or gallop. No definite JVD, ++ pitting diffuse edema. GI: Abdomen soft, non-tender, non-distended, with normoactive bowel sounds.  Ext: Warm, dry, boots for offloading in place Skin: No new rashes, lesions or ulcers on visualized skin. Neuro: Alert, needing redirection, moves all extremities, not completely oriented. Psych: Judgement and insight appear impaired. Mood euthymic & affect congruent. Behavior is appropriate.   Skin: Foam pad dressings in place Right forehead 7cm x 5cm x 0cm  Right elbow 6.5cm x 3.5cm x 0cm  Right shin 6cm x 5cm x 0cm Left shin 10cm x8cm x 0cm  Right plantar foot 2cm x 3.5cm x 0.4cm   Data Reviewed: I have personally reviewed following labs and imaging studies  CBC: Recent Labs  Lab 12/24/2019 1007 01/08/2020 1423 12/30/19 0329 12/31/19 0324 01/01/20 0420 01/02/20 0449 01/03/20 0613  WBC 6.9  --  6.7 3.9* 4.2 5.1 5.8  NEUTROABS 5.5  --    --   --   --   --   --   HGB 12.9   < > 12.8 9.9* 10.4* 10.4* 10.7*  HCT 44.9   < > 45.2 33.8* 36.5 35.6* 37.4  MCV 106.4*  --  108.4* 103.7* 106.7* 104.7* 104.8*  PLT 171  --  142* 110* 110* 93* 89*   < > = values in this interval not displayed.   Basic Metabolic Panel: Recent Labs  Lab 12/31/19 0851 12/31/19 1700 01/01/20 0723 01/02/20 0449 01/03/20 0613  NA 152* 151* 155* 156* 154*  K 4.2 3.9 3.2* 3.4* 3.8  CL 117* 116* 120* 118* 116*  CO2 27 28 28 27 25   GLUCOSE 156* 125* 107* 99 104*  BUN 68* 69* 67* 73* 79*  CREATININE 2.58* 2.40* 2.19* 2.22* 2.22*  CALCIUM 8.5* 8.3* 8.0* 8.8* 9.1  MG  --   --  2.1 2.2  --   PHOS  --   --  3.4  --   --    GFR: Estimated Creatinine Clearance: 32.6 mL/min (A) (by C-G formula based on SCr of 2.22 mg/dL (  H)). Liver Function Tests: Recent Labs  Lab 12/30/19 0329 12/31/19 0851 01/01/20 0723 01/02/20 0449 01/03/20 0613  AST 86* 52* 43* 33 28  ALT 49* 40 35 36 33  ALKPHOS 167* 124 119 139* 129*  BILITOT 2.7* 2.3* 2.0* 2.0* 1.8*  PROT 6.8 6.1* 5.5* 6.4* 6.1*  ALBUMIN 3.4* 2.8* 2.7* 3.2* 3.1*   Recent Labs  Lab 12/28/2019 1007  LIPASE 19   Recent Labs  Lab 12/30/19 0816  AMMONIA 52*   Coagulation Profile: Recent Labs  Lab 01/02/20 0449  INR 1.5*   Cardiac Enzymes: Recent Labs  Lab 01/04/2020 1007 12/30/19 0329 12/31/19 0851 01/02/20 0449  CKTOTAL 2,047* 1,022* 430* 167   BNP (last 3 results) No results for input(s): PROBNP in the last 8760 hours. HbA1C: No results for input(s): HGBA1C in the last 72 hours. CBG: Recent Labs  Lab 01/02/20 1205 01/02/20 1645 01/02/20 2110 01/03/20 0649 01/03/20 1146  GLUCAP 116* 130* 127* 94 105*   Lipid Profile: No results for input(s): CHOL, HDL, LDLCALC, TRIG, CHOLHDL, LDLDIRECT in the last 72 hours. Thyroid Function Tests: No results for input(s): TSH, T4TOTAL, FREET4, T3FREE, THYROIDAB in the last 72 hours. Anemia Panel: No results for input(s): VITAMINB12, FOLATE,  FERRITIN, TIBC, IRON, RETICCTPCT in the last 72 hours. Urine analysis:    Component Value Date/Time   COLORURINE AMBER (A) 12/20/2019 1309   APPEARANCEUR CLOUDY (A) 01/01/2020 1309   LABSPEC 1.012 01/03/2020 1309   PHURINE 5.0 01/06/2020 1309   GLUCOSEU NEGATIVE 12/30/2019 1309   GLUCOSEU NEGATIVE 11/26/2019 0825   HGBUR LARGE (A) 01/14/2020 1309   BILIRUBINUR NEGATIVE 12/27/2019 1309   KETONESUR NEGATIVE 12/21/2019 1309   PROTEINUR 100 (A) 12/25/2019 1309   UROBILINOGEN 0.2 11/26/2019 0825   NITRITE NEGATIVE 01/09/2020 1309   LEUKOCYTESUR NEGATIVE 01/10/2020 1309   Recent Results (from the past 240 hour(s))  SARS CORONAVIRUS 2 (TAT 6-24 HRS) Nasopharyngeal Urine, Clean Catch     Status: None   Collection Time: 01/13/2020 12:08 PM   Specimen: Urine, Clean Catch; Nasopharyngeal  Result Value Ref Range Status   SARS Coronavirus 2 NEGATIVE NEGATIVE Final    Comment: (NOTE) SARS-CoV-2 target nucleic acids are NOT DETECTED. The SARS-CoV-2 RNA is generally detectable in upper and lower respiratory specimens during the acute phase of infection. Negative results do not preclude SARS-CoV-2 infection, do not rule out co-infections with other pathogens, and should not be used as the sole basis for treatment or other patient management decisions. Negative results must be combined with clinical observations, patient history, and epidemiological information. The expected result is Negative. Fact Sheet for Patients: SugarRoll.be Fact Sheet for Healthcare Providers: https://www.woods-mathews.com/ This test is not yet approved or cleared by the [place] FDA and  has been authorized for detection and/or diagnosis of SARS-CoV-2 by FDA under an Emergency Use Authorization (EUA). This EUA will remain  in effect (meaning this test can be used) for the duration of the COVID-19 declaration under Section 56 4(b)(1) of the Act, 21 U.S.C. section  360bbb-3(b)(1), unless the authorization is terminated or revoked sooner. Performed at [facility], [place] [address], [place], [place] [address]   Urine culture     Status: None   Collection Time: 12/31/2019  1:09 PM   Specimen: Urine, Random  Result Value Ref Range Status   Specimen Description URINE, RANDOM  Final   Special Requests NONE  Final   Culture   Final    NO GROWTH Performed at [facility],  [address], [place], [place] [address]    Report Status 12/30/2019 FINAL  Final      Radiology Studies: DG CHEST PORT 1 VIEW  Result Date: 01/02/2020 CLINICAL DATA:  Acute respiratory failure with hypoxia. EXAM: PORTABLE CHEST 1 VIEW COMPARISON:  December 31, 2019. FINDINGS: Stable cardiomegaly. No pneumothorax or pleural effusion is noted. Lungs are clear. Left internal jugular catheter is unchanged in position. Left-sided pacemaker is unchanged. Bony thorax is unremarkable. IMPRESSION: No acute cardiopulmonary abnormality seen. Electronically Signed   By: Marijo Conception M.D.   On: 01/02/2020 09:36    Scheduled Meds:  arformoterol  15 mcg Nebulization BID   budesonide (PULMICORT) nebulizer solution  0.25 mg Nebulization BID   Chlorhexidine Gluconate Cloth  6 each Topical Daily   feeding supplement (PRO-STAT SUGAR FREE 64)  30 mL Oral BID   free water  200 mL Oral Q4H   furosemide  40 mg Intravenous BID   Gerhardt's butt cream   Topical QID   heparin injection (subcutaneous)  5,000 Units Subcutaneous Q8H   levothyroxine  100 mcg Intravenous Daily   sodium chloride flush  10-40 mL Intracatheter Q12H   Continuous Infusions:  famotidine (PEPCID) IV 20 mg (01/03/20 1429)     LOS: 5 days   Time spent: 35 minutes.  Patrecia Pour, MD [facility] [email or web address] 01/03/2020, 3:31 PM

## 2020-01-03 NOTE — Progress Notes (Signed)
Spoke with primary about central line tip placement and tip is not centrally located RN to notify MD . IV team recommendations is to replace central line.

## 2020-01-03 NOTE — Progress Notes (Addendum)
ANTICOAGULATION CONSULT NOTE   Pharmacy Consult for Heparin Indication: atrial fibrillation, VTE risk  Patient Measurements: Height: 5' 7"  (170.2 cm) Weight: 136.1 kg (300 lb) IBW/kg (Calculated) : 61.6 Heparin Dosing Weight: 94.7 kg  Vital Signs: Temp: 98 F (36.7 C) (04/15 1523) Temp Source: Axillary (04/15 1523) BP: 108/66 (04/15 1523) Pulse Rate: 105 (04/15 1523)  Labs: Recent Labs    12/31/19 1700 01/01/20 0420 01/01/20 0420 01/01/20 0723 01/02/20 0449 01/03/20 0613  HGB  --  10.4*   < >  --  10.4* 10.7*  HCT  --  36.5  --   --  35.6* 37.4  PLT  --  110*  --   --  93* 89*  LABPROT  --   --   --   --  17.9*  --   INR  --   --   --   --  1.5*  --   CREATININE   < >  --   --  2.19* 2.22* 2.22*  CKTOTAL  --   --   --   --  167  --    < > = values in this interval not displayed.    Estimated Creatinine Clearance: 32.6 mL/min (A) (by C-G formula based on SCr of 2.22 mg/dL (H)).   Assessment: 74 yr old female with hx of a fib was found on the floor with pressure ulcers on R side of body. Pt was on apixaban PTA (LD unknown); CT scan negative for bleeding on admission. Pharmacy was consulted to dose IV heparin while pt is NPO.   Other medical hx includes: rhabdo, encephalopathy, OSA, COPD on 2LNC, chronic diastolic heart failure, acute on CKD III, SSS s/p PPM, hypothyroidism, and  obesity.  Pt rec'd IV heparin from 1/88/41-6/60/63 (complicated by IV infiltration requiring central line placement; some bleeding was noted from central line insertion site at that time); last heparin infusion rate was 1300 units/hr. Pt has been receiving heparin 5000 units SQ Q 8 hrs since 12/31/19. Dopplers were negative for VTE. PCCM was consulted to see pt today, due to worsening respiratory status. Per Dr. Tamala Julian, pt at risk for VTE. Pharmacy is consulted to dose IV heparin for a fib and VTE.  Last dose of apixaban unknown and pt recovering from AKI on CKD, so will check both aPTT and heparin  levels to monitor anticoagulation until these are correlated.  H/H 10.7/37.4 (stable), platelets 89 (trending down), Scr 2.22, CrCl 32.6 ml/min; INR 1.5 on 01/02/20. Per RN, bleeding from central line insertion site has resolved (no bleeding at central line or any other site at this time). Pt rec'd heparin 5000 units SQ at 14:30 PM today, so will give ~half-bolus of IV heparin.  Goal of Therapy:  Heparin level 0.3-0.7 units/ml aPTT 66-102 seconds Monitor platelets by anticoagulation protocol: Yes   Plan:  Heparin 2350 units IV bolus X1, followed by heparin infusion at 1300 units/hr (will restart at rate of last heparin order, as pt had elevated aPTT with higher infusion rate) Check 8-hr aPTT, heparin level Monitor daily aPTT, heparin level, CBC Monitor for signs/symptoms of bleeding  Gillermina Hu, PharmD, BCPS, [facility] Clinical Pharmacist 01/03/20, 16:46 PM

## 2020-01-03 NOTE — Progress Notes (Signed)
Changed dressing to pts Rarm, rt foot, rt lateral shin.

## 2020-01-03 NOTE — Progress Notes (Signed)
pts sats dropping into 70's on venti mask and Mettler. pts work of breathing increased. Pt stated she was "afraid" contacted RT and asked to come assess, placed pt back on bipap, sats at 90, pt resting comfortable.  Notified MD.

## 2020-01-03 NOTE — Progress Notes (Signed)
RT called to assess patient. RN turning and cleaning patient. MD concerned patient is receiving too much O2 by 7L salter nasal cannula. Patient appears to be a mouth breather. Patient placed on 4L/31% venturi mask. Sats within normal limits at this time. RT will continue to monitor.

## 2020-01-03 NOTE — Procedures (Signed)
Central Venous Catheter Insertion Procedure Note VANISSA STRENGTH [id number] 1946/05/23  Procedure: Insertion of Central Venous Catheter Indications: Drug and/or fluid administration  Procedure Details Consent: Unable to obtain consent because of altered level of consciousness. Time Out: Verified patient identification, verified procedure, site/side was marked, verified correct patient position, special equipment/implants available, medications/allergies/relevent history reviewed, required imaging and test results available.  Performed  Maximum sterile technique was used including antiseptics, cap, gloves, gown, hand hygiene, mask and sheet. Skin prep: Chlorhexidine; local anesthetic administered A antimicrobial bonded/coated triple lumen catheter was placed in the right internal jugular vein using the Seldinger technique. Catheter placed to 16 cm. Blood aspirated via all 3 ports and then flushed x 3. Line sutured x 2 and dressing applied.  Ultrasound guidance used.Yes.   Catheter visualized within the internal jugular using both transverse and longitudinal views.   Evaluation Blood flow good Complications: No apparent complications Patient did tolerate procedure well. Chest X-ray ordered to verify placement.  CXR: pending.  Dr. Jose Persia Internal Medicine PGY-1  Pager: [phone] 01/03/2020, 7:17 PM

## 2020-01-03 NOTE — Progress Notes (Signed)
CCM dr. Council Mechanic to eval L IJ.  Redresses and flushed lines. States its ok to use with the current placement.

## 2020-01-03 NOTE — NC FL2 (Signed)
[place] LEVEL OF CARE SCREENING TOOL     IDENTIFICATION  Patient Name: [name] [name] Birthdate: [birth year] Sex: female Admission Date (Current Location): 01/04/2020  [facility] and [place] Number:  Herbalist and Address:  [facility], [place] [address], [place], [place] [address]      Provider Number: [id number]  Attending Physician Name and Address:  Patrecia Pour, MD  Relative Name and Phone Number:       Current Level of Care: SNF Recommended Level of Care: [address] Prior Approval Number:    Date Approved/Denied:   PASRR Number: [id number]  Discharge Plan: SNF    Current Diagnoses: Patient Active Problem List   Diagnosis Date Noted  . Non-pressure chronic ulcer of other part of right foot limited to breakdown of skin ([place])   . Fall   . Moderate protein-calorie malnutrition ([place])   . Idiopathic chronic gout without tophus   . Acute on chronic respiratory failure with hypoxia and hypercapnia (HCC)   . AMS (altered mental status) 01/14/2020  . Non-traumatic rhabdomyolysis   . Low grade fever 11/24/2019  . Acute encephalopathy 11/02/2019  . Ileus ([place]) 11/01/2019  . Epigastric hernia 10/29/2019  . AKI (acute kidney injury) ([place]) 05/04/2019  . Hypotension 05/03/2019  . Anxiety 03/22/2018  . COPD with acute exacerbation ([place]) 12/28/2017  . Chronic respiratory failure with hypoxia and hypercapnia ([place]) 11/04/2016  . Tinea 09/26/2016  . Dyspnea on exertion 09/21/2016  . Morbid (severe) obesity due to excess calories ([place]) 09/21/2016  . History of left breast cancer 06/08/2016  . Panic attacks 04/27/2016  . Chest pain 04/11/2016  . OSA (obstructive sleep apnea) 02/12/2016  . Adhesive capsulitis of right shoulder 08/21/2015  . Right shoulder pain 07/16/2015  . Osteoarthritis, hand 07/16/2015  . Hair loss 07/16/2015  . Blood in mouth of unknown source 05/06/2015  . Dysphagia, pharyngoesophageal phase  05/06/2015  . Chronic respiratory failure ([place]) 10/28/2014  . Cellulitis of female breast 10/25/2014  . Rash and nonspecific skin eruption 10/25/2014  . Hx of colonic polyps   . Benign neoplasm of cecum   . Benign neoplasm of transverse colon   . CHF (congestive heart failure) ([place])   . History of Clostridium difficile 06/27/2014  . RUQ pain 05/31/2014  . Lump in the abdomen 05/31/2014  . Diarrhea 05/14/2014  . Abdominal pain, other specified site 05/14/2014  . Pacemaker 12/03/2013  . Exfoliative dermatitis 11/27/2013  . Urinary urgency 11/13/2013  . Post-menopausal bleeding 11/13/2013  . Syncope and collapse 08/30/2013  . Atrial fibrillation ([place])   . Syncope 08/26/2013  . Postmenopausal vaginal bleeding 05/02/2013  . Tachy-brady syndrome ([place]) 03/15/2013  . Acute on chronic diastolic CHF (congestive heart failure) ([place]) 03/15/2013  . PFO (patent foramen ovale) 03/15/2013  . Hypokalemia 03/15/2013  . Atrial flutter ([place]) 03/01/2013  . Tachycardia 03/01/2013  . Anticoagulant long-term use 03/01/2013  . Impaired glucose tolerance 11/01/2012  . LFT elevation 11/01/2012  . Right lumbar radiculopathy 04/28/2012  . Cough 04/15/2012  . Abnormal TSH 04/15/2012  . Acute appendicitis 03/29/2012  . Sick sinus syndrome ([place]) 03/08/2012  . Morbid obesity with BMI of 50.0-59.9, adult ([place]) 03/08/2012  . Breast cancer ([place]) 06/13/2011  . Asthma 06/13/2011  . Depression 06/13/2011  . Colon polyps 06/13/2011  . Neuropathy 06/13/2011  . Cervical radiculopathy 06/13/2011  . Gout 06/13/2011  . Obesity hypoventilation syndrome ([place]) 06/13/2011  . Cellulitis of leg, right 06/13/2011  . Encounter  for well adult exam with abnormal findings 06/04/2011  . OXYGEN-USE OF SUPPLEMENTAL 04/07/2010  . ACUT MI SUBENDOCARDIAL INFARCT SUBSQT EPIS CARE 03/13/2010  . COPD GOLD III 03/10/2010  . Sleep apnea 03/10/2010  . Hypothyroidism, acquired 03/17/2009  . Hyperlipidemia 03/17/2009  . Essential  hypertension 03/17/2009  . Coronary atherosclerosis 03/17/2009    Orientation RESPIRATION BLADDER Height & Weight     Self, Time, Place  Normal Incontinent Weight: 300 lb (136.1 kg) Height:  5' 7"  (170.2 cm)  BEHAVIORAL SYMPTOMS/MOOD NEUROLOGICAL BOWEL NUTRITION STATUS      Incontinent Diet(See discharge packet)  AMBULATORY STATUS COMMUNICATION OF NEEDS Skin   Extensive Assist Verbally Skin abrasions                       Personal Care Assistance Level of Assistance  Bathing, Feeding, Dressing Bathing Assistance: Maximum assistance Feeding assistance: Limited assistance Dressing Assistance: Maximum assistance     Functional Limitations Info             SPECIAL CARE FACTORS FREQUENCY  PT (By licensed PT), OT (By licensed OT)     PT Frequency: 5x a week OT Frequency: 5x a week            Contractures Contractures Info: Not present    Additional Factors Info  Code Status, Allergies Code Status Info: full Allergies Info: Cephalexin, Ciprofloxacin, Lisinopril, Codeine, Latex, Tape, Cleocin -Clindamycin Hcl, Dilaudid- Hydromorphone, Doxycycline, and Penicillins           Current Medications (01/03/2020):  This is the current hospital active medication list Current Facility-Administered Medications  Medication Dose Route Frequency Provider Last Rate Last Admin  . acetaminophen (TYLENOL) tablet 650 mg  650 mg Oral Q6H PRN Wynetta Fines T, MD   650 mg at 12/31/19 0550   Or  . acetaminophen (TYLENOL) suppository 650 mg  650 mg Rectal Q6H PRN Wynetta Fines T, MD      . albuterol (PROVENTIL) (2.5 MG/3ML) 0.083% nebulizer solution 2.5 mg  2.5 mg Inhalation Q6H PRN Wynetta Fines T, MD      . arformoterol [facility]) nebulizer solution 15 mcg  15 mcg Nebulization BID Lavina Hamman, MD   15 mcg at 01/02/20 2045  . budesonide (PULMICORT) nebulizer solution 0.25 mg  0.25 mg Nebulization BID Lavina Hamman, MD   0.25 mg at 01/02/20 2045  . Chlorhexidine Gluconate Cloth 2 % PADS  6 each  6 each Topical Daily Lavina Hamman, MD   6 each at 01/03/20 1150  . dextrose 50 % solution 50 mL  1 ampule Intravenous PRN Lavina Hamman, MD      . famotidine (PEPCID) IVPB 20 mg premix  20 mg Intravenous Q24H Lavina Hamman, MD   Stopped at 01/02/20 1413  . feeding supplement (PRO-STAT SUGAR FREE 64) liquid 30 mL  30 mL Oral BID Lavina Hamman, MD   30 mL at 01/03/20 1131  . free water 200 mL  200 mL Oral Q4H Lavina Hamman, MD   200 mL at 01/03/20 1152  . furosemide (LASIX) injection 40 mg  40 mg Intravenous BID Lavina Hamman, MD   40 mg at 01/03/20 1131  . Gerhardt's butt cream   Topical QID Lavina Hamman, MD   Given at 01/03/20 1151  . heparin injection 5,000 Units  5,000 Units Subcutaneous Q8H Lavina Hamman, MD   5,000 Units at 01/03/20 0526  . levothyroxine (SYNTHROID, LEVOTHROID) injection 100 mcg  100 mcg Intravenous Daily Wynetta Fines T, MD   100 mcg at 01/03/20 1131  . ondansetron (ZOFRAN) injection 4 mg  4 mg Intravenous Q6H PRN Wynetta Fines T, MD      . oxyCODONE (Oxy IR/ROXICODONE) immediate release tablet 5 mg  5 mg Oral Q4H PRN Lavina Hamman, MD      . sodium chloride flush (NS) 0.9 % injection 10-40 mL  10-40 mL Intracatheter Q12H Lavina Hamman, MD   10 mL at 01/02/20 2300     Discharge Medications: Please see discharge summary for a list of discharge medications.  Relevant Imaging Results:  Relevant Lab Results:   Additional Information SSN [id number]  Emeterio Reeve, [place]

## 2020-01-03 NOTE — Consult Note (Signed)
NAME:  [name] [name], MRN:  [id number], DOB:  [birth year], LOS: 5 ADMISSION DATE:  12/25/2019, CONSULTATION DATE:  01/03/20 REFERRING MD:  Bonner Puna - TRH, CHIEF COMPLAINT:  AMS, Found down. Reason for PCCM consult: Respiratory failure requiring BiPAP   Brief History   74 yo F admitted with AMS after being found down for unknown period of time, found by EMS with pressure ulcers on R side of body and found to be hypoxic 80% on RA. Admitted 4/10 to [facility] with AKI + rhabdo, encephalopathy, acute on chronic respiratory failure.  Patient has progressively requiring increasing amounts of respiratory support, now requiring BiPAP nearly continuously. PCCM consulted 4/15.  History of present illness   History obtained from chart review.  74 yo F PMH OSA, COPD on 2LNC, chronic diastolic heart failure, Afib, CKD III, SSS s/p PPM, hypothyroidism, obesity, who presented to ED 4/10 with AMS after being found down at home. EMS was dispatched to patient's home after neighbors noticed mail was full. Upon EMS arrival, pt was hypoxic SpO2 80% on RA and had pressure ulcers on R side of body, and endorsed global myalgia. Patient reported that she sustained fall approx 6 days prior to EMS dispatch and was unable to stand self.   In ED, pt hypoxic but VBG not overtly hypercarbic. CK 2000, Cr 2.48. Admitted to [facility].  4/15 patient with progressively worsening resp status, as well as complex medical picture including rhabdo/AKI s/p fluid restriction due to pulm status, elevated BNP despite fluid restriction and ongoing diuresis. PCCM consulted.    Past Medical History  COPD on 2LNC OSA SSS s/p PPM Hypothyroidism  Paroxysmal Atrial Fibrillation  CAD, MI  HFpEF Anemia Fatty liver Depression HTN HLD Hx PFO  Breast Cancer s/p chemo Cellulitis  Significant Hospital Events   4/10 admitted to [facility] 4/11 PCCM placed IJ CVC 4/14 requiring increasing BiPAP support 4/15 PCCM consult   Consults:  PCCM   Procedures:   4/11 CVC>>  Significant Diagnostic Tests:  CT H 4/11> R scalp hematoma. Multiple scattered small calcifications in subarachnoid space, stable. No acute intracranial abnormalities.  CXR 4/15 no PTX of pleural effusion. Clear lungs bilaterally. L sided pacemaker. L IJ catheter. Cardiomegaly, stable in appearance.   Micro Data:  4/10 COVID-19 neg   Antimicrobials:    Interim history/subjective:  Requiring increasing duration of BiPAP support  Objective   Blood pressure 117/63, pulse (!) 105, temperature (!) 97.4 F (36.3 C), temperature source Oral, resp. rate (!) 23, height 5' 7"  (1.702 m), weight 136.1 kg, SpO2 99 %.    FiO2 (%):  [31 %-40 %] 40 %   Intake/Output Summary (Last 24 hours) at 01/03/2020 1540 Last data filed at 01/03/2020 0900 Gross per 24 hour  Intake 580 ml  Output --  Net 580 ml   Filed Weights   12/27/2019 1046  Weight: 136.1 kg    Examination: General: Chronically and acutely ill appearing older adult F, reclined in bed on BiPAP NAD  HENT: NCAT. Pink mm. Trachea midline. Anicteric sclera  Lungs: Symmetrical chest expansion, CTA without wheezing or rhonchi. Weak inspiratory effort.  Cardiovascular: irir. s1s2 no rgm, cap refill < 3 seconds  Abdomen: Obese, soft, round, non-tender. + bowel sounds  Extremities: BUE BLE 2+ edema. No obvious joint deformity, no cyanosis.  Neuro: Drowsy, awakens and is mildly confused. Calm and interactive, answering questions and following simple commands  GU: yellow urine  Skin: Xeroform on wounds.   Resolved Hospital Problem list  Assessment & Plan:   Acute encephalopathy, suspect toxic metabolic in setting of rhabdo, aki. No lab data to suggest infectious -CT H without acute abnormality on admission  P -delirium precautions -minimize CNS depressing medications   Acute on chronic respiratory failure with hypercarbia and hypoxia -COPD on 2LNC at home -OSA  -CXR is actually quite benign from pulm standpoint. BNP  is elevated but patient continues fluid restriction and diuresis. With downtime, PE possible but has been getting SQH inpatient and no evidence of DVT on [place] P -Continue BiPAP PRN and qHS  -NPO with BiPAP  -Transferring to ICU due to intubation risk  -check ddimer  -ABG   Atrial Fibrillation P -start heparin gtt, dose per pharmacist  -ICU monitoring  Chronic diastolic heart failure -ECHO 4/11 with LVEF 55-60%. Atria appear very enlarged -elevated BNP on 4/15 to 1900 Hx PFO P -repeat echo  AKI Rhabdomyolysis, improving P -trend renal indices -dc lasix, add diuril   Hypernatremia P -FWF   Transaminitis, improved -likely in setting of prolonged downtime, rhabdo. -RUQ without significant hepatic abnormality, some GB sludge  P -PRN LFTs   Hypothyroidism P -Synthroid   Dysphagia -CT H without evidence of acute CVA  P -Dysphagia 2 diet when able to resume PO intake   R sided skin breakdown, present on admission -Appreciate WOC consult recs -Dr Sharol Given has evaluated-- no role for debridement or abx at this time   Difficult IV access -presently with R IJ CVC which has been intermittently functioning sub-optimally P -will plan to replace when arrives to unit  Best practice:  Diet: NPO Pain/Anxiety/Delirium protocol (if indicated): na VAP protocol (if indicated): na DVT prophylaxis:  GI prophylaxis: Heparin gtt Glucose control: monitor Mobility: PT/OT  Code Status: Full  Family Communication: pending  Disposition: Transfer to ICU 4/15   Labs   CBC: Recent Labs  Lab 01/15/2020 1007 01/07/2020 1423 12/30/19 0329 12/31/19 0324 01/01/20 0420 01/02/20 0449 01/03/20 0613  WBC 6.9  --  6.7 3.9* 4.2 5.1 5.8  NEUTROABS 5.5  --   --   --   --   --   --   HGB 12.9   < > 12.8 9.9* 10.4* 10.4* 10.7*  HCT 44.9   < > 45.2 33.8* 36.5 35.6* 37.4  MCV 106.4*  --  108.4* 103.7* 106.7* 104.7* 104.8*  PLT 171  --  142* 110* 110* 93* 89*   < > = values in this interval not  displayed.    Basic Metabolic Panel: Recent Labs  Lab 12/31/19 0851 12/31/19 1700 01/01/20 0723 01/02/20 0449 01/03/20 0613  NA 152* 151* 155* 156* 154*  K 4.2 3.9 3.2* 3.4* 3.8  CL 117* 116* 120* 118* 116*  CO2 27 28 28 27 25   GLUCOSE 156* 125* 107* 99 104*  BUN 68* 69* 67* 73* 79*  CREATININE 2.58* 2.40* 2.19* 2.22* 2.22*  CALCIUM 8.5* 8.3* 8.0* 8.8* 9.1  MG  --   --  2.1 2.2  --   PHOS  --   --  3.4  --   --    GFR: Estimated Creatinine Clearance: 32.6 mL/min (A) (by C-G formula based on SCr of 2.22 mg/dL (H)). Recent Labs  Lab 12/30/19 0329 12/30/19 0329 12/30/19 0816 12/30/19 1043 12/31/19 0217 12/31/19 0324 01/01/20 0420 01/02/20 0449 01/03/20 0613  PROCALCITON 0.85  --   --   --  0.69  --   --   --   --   WBC 6.7   < >  --   --   --  3.9* 4.2 5.1 5.8  LATICACIDVEN  --   --  3.1* 3.1*  --   --   --   --   --    < > = values in this interval not displayed.    Liver Function Tests: Recent Labs  Lab 12/30/19 0329 12/31/19 0851 01/01/20 0723 01/02/20 0449 01/03/20 0613  AST 86* 52* 43* 33 28  ALT 49* 40 35 36 33  ALKPHOS 167* 124 119 139* 129*  BILITOT 2.7* 2.3* 2.0* 2.0* 1.8*  PROT 6.8 6.1* 5.5* 6.4* 6.1*  ALBUMIN 3.4* 2.8* 2.7* 3.2* 3.1*   Recent Labs  Lab 12/30/2019 1007  LIPASE 19   Recent Labs  Lab 12/30/19 0816  AMMONIA 52*    ABG    Component Value Date/Time   PHART 7.294 (L) 12/31/2019 1305   PCO2ART 50.9 (H) 12/31/2019 1305   PO2ART 106 12/31/2019 1305   HCO3 23.9 12/31/2019 1305   TCO2 24 12/23/2019 1423   ACIDBASEDEF 1.7 12/31/2019 1305   O2SAT 97.5 12/31/2019 1305     Coagulation Profile: Recent Labs  Lab 01/02/20 0449  INR 1.5*    Cardiac Enzymes: Recent Labs  Lab 01/09/2020 1007 12/30/19 0329 12/31/19 0851 01/02/20 0449  CKTOTAL 2,047* 1,022* 430* 167    HbA1C: Hgb A1c MFr Bld  Date/Time Value Ref Range Status  12/31/2019 08:50 AM 5.1 4.8 - 5.6 % Final    Comment:    (NOTE)         Prediabetes: 5.7 -  6.4         Diabetes: >6.4         Glycemic control for adults with diabetes: <7.0   05/03/2019 10:17 PM 5.2 4.8 - 5.6 % Final    Comment:    (NOTE) Pre diabetes:          5.7%-6.4% Diabetes:              >6.4% Glycemic control for   <7.0% adults with diabetes     CBG: Recent Labs  Lab 01/02/20 1205 01/02/20 1645 01/02/20 2110 01/03/20 0649 01/03/20 1146  GLUCAP 116* 130* 127* 94 105*    Review of Systems:   Endorses weakness, myalgia, malaise  Denies SOB, cough, wheeze Denies runny nose, congestion Denies chest pain, palpitations, endorses edema  Denies painful urination or urinary hesitancy  Denies rash. Endorses R sided skin breakdown Endorses generalized pain/myalgia, endorses recent fall, denies HA  Denies n/v/d Denies unintentional weight loss or gain.  Denies change in appetite, increased thirst  Denies night sweats, fever, chills Past Medical History  She,  has a past medical history of Anemia, Asthma (06/13/2011), Atrial fibrillation ([place]), Atrial flutter ([place]), Breast cancer ([place]) (dx'd 2005), CAD (coronary artery disease), Cellulitis, Cervical radiculopathy (06/13/2011), CHF (congestive heart failure) ([place]), Complication of anesthesia, COPD (chronic obstructive pulmonary disease) ([place]), Depression (06/13/2011), Diverticulosis, Esophageal dysmotility, Fatty liver, Gout (06/13/2011), History of Clostridium difficile (early dec 2015), History of home oxygen therapy, dysfunctional uterine bleeding, Hyperlipidemia, Hypertension, Hypothyroidism, Impaired glucose tolerance (11/01/2012), Myocardial infarction ([place]), Neuropathy, Obesity hypoventilation syndrome ([place]) (06/13/2011), OSA on CPAP, Personal history of chemotherapy, Personal history of radiation therapy, PFO (patent foramen ovale), Presence of permanent cardiac pacemaker, Risk for falls, Syncope and collapse, and Tubular adenoma of colon.   Surgical History    Past Surgical History:  Procedure Laterality Date  .  APPENDECTOMY    . BREAST BIOPSY    . BREAST LUMPECTOMY Left 05/2004  . CARDIOVERSION N/A 03/15/2013   Procedure: CARDIOVERSION;  Surgeon: Thayer Headings, MD;  Location: [facility] ENDOSCOPY;  Service: Cardiovascular;  Laterality: N/A;  . CERVICAL BIOPSY  June 2013   at [facility] for Heavy  Bleeding  . [place] SURGERY  2004 or 2005   have cadaver bones,, screws, and plates c 5 to c 6  . COLONOSCOPY    . COLONOSCOPY WITH PROPOFOL N/A 10/22/2014   Procedure: COLONOSCOPY WITH PROPOFOL;  Surgeon: Jerene Bears, MD;  Location: [facility] ENDOSCOPY;  Service: Gastroenterology;  Laterality: N/A;  . DILATION AND CURETTAGE OF UTERUS    . EPIGASTRIC HERNIA REPAIR    . EPIGASTRIC HERNIA REPAIR N/A 10/29/2019   Procedure: EPIGASTRIC HERNIA REPAIR WITH MESH;  Surgeon: Coralie Keens, MD;  Location: [place];  Service: General;  Laterality: N/A;  LMA ANESTHESIA  . EYE SURGERY    . LAPAROSCOPIC APPENDECTOMY  03/29/2012   Procedure: APPENDECTOMY LAPAROSCOPIC;  Surgeon: Adin Hector, MD;  Location: [facility] ORS;  Service: General;  Laterality: N/A;  . LOOP RECORDER EXPLANT  08/31/2013   Procedure: LOOP RECORDER EXPLANT;  Surgeon: Coralyn Mark, MD;  Location: [facility] CATH LAB;  Service: Cardiovascular;;  . LOOP RECORDER IMPLANT  08-28-2013; 08-31-2013   MDT LinQ implanted by Dr Rayann Heman for syncope; explanted 08-31-2013 after sinus pauses identified  . LOOP RECORDER IMPLANT N/A 08/28/2013   Procedure: LOOP RECORDER IMPLANT;  Surgeon: Coralyn Mark, MD;  Location: [place] CATH LAB;  Service: Cardiovascular;  Laterality: N/A;  . OVARIAN CYST REMOVAL    . PACEMAKER INSERTION  08-31-2013   Biotronik Evera dual chamber pacemaker placed for neurocardiogenic syncope and sinus pauses by Dr Rayann Heman  . PERMANENT PACEMAKER INSERTION N/A 08/31/2013   Procedure: PERMANENT PACEMAKER INSERTION;  Surgeon: Coralyn Mark, MD;  Location: [facility] CATH LAB;  Service: Cardiovascular;  Laterality: N/A;  . TEE WITHOUT CARDIOVERSION N/A 03/15/2013   Procedure:  TRANSESOPHAGEAL ECHOCARDIOGRAM (TEE);  Surgeon: Thayer Headings, MD;  Location: [place];  Service: Cardiovascular;  Laterality: N/A;     Social History   reports that she quit smoking about 21 years ago. Her smoking use included cigarettes. She has a 20.00 pack-year smoking history. She has never used smokeless tobacco. She reports current alcohol use. She reports that she does not use drugs.   Family History   Her family history includes Breast cancer in her maternal aunt; Breast cancer (age of onset: 34) in her sister; Breast cancer (age of onset: 10) in her cousin; Diabetes in her brother; Heart disease in her mother; Ovarian cancer in her sister and sister; Uterine cancer in her mother.   Allergies Allergies  Allergen Reactions  . Cephalexin Other (See Comments)    Exfoliative dermatitis reaction Fine on ceftriaxone  . Ciprofloxacin Other (See Comments)    Folliculitis   . Lisinopril Cough  . Codeine     REACTION: unknown - pt. states unaware of having reaction to codeine  . Latex Dermatitis  . Tape Dermatitis  . Cleocin [Clindamycin Hcl] Rash  . Dilaudid [Hydromorphone] Other (See Comments)    Oversedation  . Doxycycline Rash  . Penicillins Rash    Did it involve swelling of the face/tongue/throat, SOB, or low BP? No Did it involve sudden or severe rash/hives, skin peeling, or any reaction on the inside of your mouth or nose? Yes Did you need to seek medical attention at a hospital or doctor's office?Yes When did it last happen?several years ago If all above answers are "NO", may proceed with cephalosporin use.  Home Medications  Prior to Admission medications   Medication Sig Start Date End Date Taking? Authorizing Provider  albuterol (PROAIR HFA) 108 (90 Base) MCG/ACT inhaler Inhale 2 puffs into the lungs See admin instructions. Inhale 2 puffs into the lungs in the morning, 2 puffs at bedtime, and one to two times daily as needed for shortness of breath or  wheezing   Yes [provider]  apixaban (ELIQUIS) 5 MG TABS tablet Take 1 tablet (5 mg total) by mouth 2 (two) times daily. 10/02/19  Yes Biagio Borg, MD  AZO-CRANBERRY PO Take 1 tablet by mouth daily as needed (urinary symptoms).    Yes [provider]  B Complex Vitamins (VITAMIN B COMPLEX PO) Take 1 tablet by mouth daily.   Yes [provider]  budesonide-formoterol (SYMBICORT) 160-4.5 MCG/ACT inhaler Inhale 2 puffs into the lungs 2 (two) times daily. 10/22/19  Yes Biagio Borg, MD  cholecalciferol (VITAMIN D3) 25 MCG (1000 UNIT) tablet Take 2,000 Units by mouth daily.   Yes [provider]  diphenhydrAMINE (BENADRYL) 25 MG tablet Take 25 mg by mouth every 6 (six) hours as needed for allergies.   Yes [provider]  hydrOXYzine (ATARAX/VISTARIL) 25 MG tablet Take 25 mg by mouth 3 (three) times daily as needed for itching.   Yes [provider]  OXYGEN Inhale 2-3 L/min into the lungs continuous.    Yes [provider]  potassium chloride SA (KLOR-CON) 20 MEQ tablet Take 1 tablet (20 mEq total) by mouth 2 (two) times daily. 11/06/19  Yes Shelly Coss, MD  traMADol (ULTRAM) 50 MG tablet Take 1 tablet (50 mg total) by mouth daily as needed for moderate pain. Patient taking differently: Take 50 mg by mouth every 6 (six) hours as needed for moderate pain.  10/02/19  Yes Biagio Borg, MD  vitamin C (ASCORBIC ACID) 500 MG tablet Take 500 mg by mouth daily.    Yes [provider]  VITAMIN E PO Take 1,000 Units by mouth daily.    Yes [provider]  allopurinol (ZYLOPRIM) 300 MG tablet TAKE 1 TABLET DAILY Patient not taking: No sig reported 05/17/17   Biagio Borg, MD  amitriptyline (ELAVIL) 100 MG tablet Take 1 tablet (100 mg total) by mouth at bedtime. Patient not taking: Reported on 12/25/2019 06/13/19   Biagio Borg, MD  atorvastatin (LIPITOR) 40 MG tablet TAKE 1 TABLET DAILY Patient not taking: No sig reported  09/26/18   Lelon Perla, MD  clonazePAM (KLONOPIN) 0.5 MG tablet Take 1 tablet (0.5 mg total) by mouth 2 (two) times daily as needed for anxiety. Patient not taking: Reported on 01/02/2020 12/19/19   Biagio Borg, MD  DULoxetine (CYMBALTA) 30 MG capsule TAKE 2 CAPSULES (60 MG TOTAL) BY MOUTH AT BEDTIME. Patient not taking: Reported on 12/25/2019 12/17/19   Biagio Borg, MD  furosemide (LASIX) 40 MG tablet Take 1 tablet (40 mg total) by mouth 2 (two) times daily. Patient not taking: Reported on 12/22/2019 11/06/19   Shelly Coss, MD  levothyroxine (SYNTHROID) 200 MCG tablet Take 1 tablet (200 mcg total) by mouth daily. Annual appt due in Sept must see provider for future refills Patient not taking: Reported on 01/16/2020 12/13/19   Biagio Borg, MD  metoprolol tartrate (LOPRESSOR) 25 MG tablet Take 0.5 tablets (12.5 mg total) by mouth as needed (for heart rate above 120 at rest). Patient not taking: Reported on 01/08/2020 09/28/19   Lelon Perla,  MD  omeprazole (PRILOSEC) 40 MG capsule Take 1 capsule (40 mg total) by mouth daily. Patient not taking: Reported on 01/09/2020 05/10/19   Domenic Moras, PA-C     Critical care time: 43 minutes      CRITICAL CARE Performed by: Cristal Generous   Total critical care time: 43 minutes  Critical care time was exclusive of separately billable procedures and treating other patients. Critical care was necessary to treat or prevent imminent or life-threatening deterioration.  Critical care was time spent personally by me on the following activities: development of treatment plan with patient and/or surrogate as well as nursing, discussions with consultants, evaluation of patient's response to treatment, examination of patient, obtaining history from patient or surrogate, ordering and performing treatments and interventions, ordering and review of laboratory studies, ordering and review of radiographic studies, pulse oximetry and re-evaluation of patient's  condition.    Eliseo Gum MSN, AGACNP-BC [place] [phone] If no answer, [phone] 01/03/2020, 4:39 PM

## 2020-01-04 ENCOUNTER — Inpatient Hospital Stay (HOSPITAL_COMMUNITY): Payer: Medicare HMO

## 2020-01-04 ENCOUNTER — Ambulatory Visit: Payer: Self-pay

## 2020-01-04 DIAGNOSIS — I5033 Acute on chronic diastolic (congestive) heart failure: Secondary | ICD-10-CM | POA: Diagnosis not present

## 2020-01-04 DIAGNOSIS — J9621 Acute and chronic respiratory failure with hypoxia: Secondary | ICD-10-CM | POA: Diagnosis not present

## 2020-01-04 DIAGNOSIS — J9622 Acute and chronic respiratory failure with hypercapnia: Secondary | ICD-10-CM | POA: Diagnosis not present

## 2020-01-04 DIAGNOSIS — N179 Acute kidney failure, unspecified: Secondary | ICD-10-CM | POA: Diagnosis not present

## 2020-01-04 LAB — COMPREHENSIVE METABOLIC PANEL
ALT: 30 U/L (ref 0–44)
AST: 20 U/L (ref 15–41)
Albumin: 2.8 g/dL — ABNORMAL LOW (ref 3.5–5.0)
Alkaline Phosphatase: 144 U/L — ABNORMAL HIGH (ref 38–126)
Anion gap: 9 (ref 5–15)
BUN: 79 mg/dL — ABNORMAL HIGH (ref 8–23)
CO2: 30 mmol/L (ref 22–32)
Calcium: 9.5 mg/dL (ref 8.9–10.3)
Chloride: 116 mmol/L — ABNORMAL HIGH (ref 98–111)
Creatinine, Ser: 2.13 mg/dL — ABNORMAL HIGH (ref 0.44–1.00)
GFR calc Af Amer: 26 mL/min — ABNORMAL LOW (ref >60)
GFR calc non Af Amer: 22 mL/min — ABNORMAL LOW (ref >60)
Glucose, Bld: 95 mg/dL (ref 70–99)
Potassium: 3.7 mmol/L (ref 3.5–5.1)
Sodium: 155 mmol/L — ABNORMAL HIGH (ref 135–145)
Total Bilirubin: 1.9 mg/dL — ABNORMAL HIGH (ref 0.3–1.2)
Total Protein: 6.1 g/dL — ABNORMAL LOW (ref 6.5–8.1)

## 2020-01-04 LAB — GLUCOSE, CAPILLARY
Glucose-Capillary: 101 mg/dL — ABNORMAL HIGH (ref 70–99)
Glucose-Capillary: 125 mg/dL — ABNORMAL HIGH (ref 70–99)
Glucose-Capillary: 68 mg/dL — ABNORMAL LOW (ref 70–99)
Glucose-Capillary: 71 mg/dL (ref 70–99)
Glucose-Capillary: 80 mg/dL (ref 70–99)
Glucose-Capillary: 82 mg/dL (ref 70–99)

## 2020-01-04 LAB — HEPARIN LEVEL (UNFRACTIONATED): Heparin Unfractionated: 0.72 IU/mL — ABNORMAL HIGH (ref 0.30–0.70)

## 2020-01-04 LAB — APTT: aPTT: 108 seconds — ABNORMAL HIGH (ref 24–36)

## 2020-01-04 LAB — CK: Total CK: 49 U/L (ref 38–234)

## 2020-01-04 LAB — D-DIMER, QUANTITATIVE: D-Dimer, Quant: 5.11 ug/mL-FEU — ABNORMAL HIGH (ref 0.00–0.50)

## 2020-01-04 MED ORDER — LEVOTHYROXINE SODIUM 100 MCG PO TABS
200.0000 ug | ORAL_TABLET | Freq: Every day | ORAL | Status: DC
  Administered 2020-01-05 – 2020-01-16 (×11): 200 ug via ORAL
  Filled 2020-01-04 (×6): qty 2
  Filled 2020-01-04: qty 8
  Filled 2020-01-04 (×5): qty 2
  Filled 2020-01-04: qty 8
  Filled 2020-01-04: qty 2

## 2020-01-04 MED ORDER — DEXTROSE 10 % IV SOLN: INTRAVENOUS | Status: DC

## 2020-01-04 NOTE — Progress Notes (Signed)
eLink Physician-Brief Progress Note Patient Name: SELAM PIETSCH DOB: 12-14-1945 MRN: [id number]   Date of Service  01/04/2020  HPI/Events of Note  POCT glucose 71. Multiple other values in 70s throughout evening. Volume overloaded.   eICU Interventions  Start D10 @ 10cc/hr.     Intervention Category Intermediate Interventions: Other:  Charlott Rakes 01/04/2020, 5:08 AM

## 2020-01-04 NOTE — Progress Notes (Signed)
ANTICOAGULATION CONSULT NOTE   Pharmacy Consult for Heparin Indication: atrial fibrillation, VTE risk  Patient Measurements: Height: 5' 7"  (170.2 cm) Weight: 136.1 kg (300 lb) IBW/kg (Calculated) : 61.6 Heparin Dosing Weight: 94.7 kg  Vital Signs: Temp: 97.9 F (36.6 C) (04/16 0351) Temp Source: Axillary (04/16 0351) BP: 114/60 (04/16 0500) Pulse Rate: 83 (04/16 0500)  Labs: Recent Labs    01/01/20 0723 01/02/20 0449 01/02/20 0449 01/03/20 0613 01/03/20 1813 01/04/20 0540  HGB  --  10.4*   < > 10.7* 11.9*  --   HCT  --  35.6*  --  37.4 35.0*  --   PLT  --  93*  --  89*  --   --   APTT  --   --   --   --   --  108*  LABPROT  --  17.9*  --   --   --   --   INR  --  1.5*  --   --   --   --   HEPARINUNFRC  --   --   --   --   --  0.72*  CREATININE 2.19* 2.22*  --  2.22*  --   --   CKTOTAL  --  167  --   --   --   --    < > = values in this interval not displayed.    Estimated Creatinine Clearance: 32.6 mL/min (A) (by C-G formula based on SCr of 2.22 mg/dL (H)).   Assessment: 74 yr old female with hx of a fib was found on the floor with pressure ulcers on R side of body. Pt was on apixaban PTA (LD unknown); CT scan negative for bleeding on admission. Pharmacy was consulted to dose IV heparin while pt is NPO.   Other medical hx includes: rhabdo, encephalopathy, OSA, COPD on 2LNC, chronic diastolic heart failure, acute on CKD III, SSS s/p PPM, hypothyroidism, and obesity.  Pt rec'd IV heparin from 3/71/69-6/78/93 (complicated by IV infiltration requiring central line placement; some bleeding was noted from central line insertion site at that time); last heparin infusion rate was 1300 units/hr. Pt has been receiving heparin 5000 units SQ Q 8 hrs since 12/31/19. Dopplers were negative for VTE. PCCM was consulted to see pt today, due to worsening respiratory status. Per Dr. Tamala Julian, pt at risk for VTE. Pharmacy is consulted to dose IV heparin for a fib and VTE.  Updates  today: aPTT and heparin levels are correlating at this time. Will no longer need to monitor aPTT.  Hgb stable 10-11, platelets 89 (trending down), Scr 2.22, CrCl 32.6 ml/min; INR 1.5 on 01/02/20. Per RN, no bleeding or line issues at this time. Will monitor closely.   Heparin level slightly above goal at 0.72 this AM. Will decrease rate slightly.   Goal of Therapy:  Heparin level 0.3-0.7 units/ml aPTT 66-102 seconds Monitor platelets by anticoagulation protocol: Yes   Plan:  Decrease heparin drip to 1200 units/hr Monitor daily heparin level and CBC Monitor for signs/symptoms of bleeding F/u plan to restart apixaban once patient can swallow  Kennon Holter, PharmD PGY1 Ambulatory Care Pharmacy Resident 01/03/20, 16:46 PM

## 2020-01-04 NOTE — Progress Notes (Signed)
  Echocardiogram 2D Echocardiogram has been performed.  [name] [name] G Shawana Knoch 01/04/2020, 9:26 AM

## 2020-01-04 NOTE — Progress Notes (Addendum)
NAME:  [name] [name], MRN:  [id number], DOB:  January 14, 1946, LOS: 6 ADMISSION DATE:  01/10/2020, CONSULTATION DATE:  01/03/20 REFERRING MD:  Bonner Puna - TRH, CHIEF COMPLAINT:  AMS, Found down. Reason for PCCM consult: Respiratory failure requiring BiPAP   Brief History   74 yo F admitted with AMS after being found down for unknown period of time, found by EMS with pressure ulcers on R side of body and found to be hypoxic 80% on RA. Admitted 4/10 to [facility] with AKI + rhabdo, encephalopathy, acute on chronic respiratory failure.  Patient has progressively requiring increasing amounts of respiratory support, now requiring BiPAP nearly continuously. PCCM consulted 4/15.   History of present illness   History obtained from chart review.  74 yo F PMH OSA, COPD on 2LNC, chronic diastolic heart failure, Afib, CKD III, SSS s/p PPM, hypothyroidism, obesity, who presented to ED 4/10 with AMS after being found down at home. EMS was dispatched to patient's home after neighbors noticed mail was full. Upon EMS arrival, pt was hypoxic SpO2 80% on RA and had pressure ulcers on R side of body, and endorsed global myalgia. Patient reported that she sustained fall approx 6 days prior to EMS dispatch and was unable to stand self.   In ED, pt hypoxic but VBG not overtly hypercarbic. CK 2000, Cr 2.48. Admitted to [facility].  4/15 patient with progressively worsening resp status, as well as complex medical picture including rhabdo/AKI s/p fluid restriction due to pulm status, elevated BNP despite fluid restriction and ongoing diuresis. PCCM consulted.   Past Medical History  COPD on 2LNC OSA SSS s/p PPM Hypothyroidism  Paroxysmal Atrial Fibrillation  CAD, MI  HFpEF Anemia Fatty liver Depression HTN HLD Hx PFO  Breast Cancer s/p chemo Cellulitis   Significant Hospital Events   4/10 admitted to [facility] 4/11 PCCM placed IJ CVC 4/14 requiring increasing BiPAP support 4/15 PCCM consult   Consults:  PCCM   Procedures:   4/11 CVC>>  Significant Diagnostic Tests:  CT H 4/11> R scalp hematoma. Multiple scattered small calcifications in subarachnoid space, stable. No acute intracranial abnormalities.  CXR 4/15 no PTX of pleural effusion. Clear lungs bilaterally. L sided pacemaker. L IJ catheter. Cardiomegaly, stable in appearance.   Micro Data:  4/10 COVID-19 neg   Antimicrobials:    Interim history/subjective:  Lying in bed with BIPAP in place, able to answer most orientation questions and follow simple commands   Objective   Blood pressure (!) 111/51, pulse 85, temperature (!) 97.4 F (36.3 C), temperature source Axillary, resp. rate 16, height 5' 7"  (1.702 m), weight 136.1 kg, SpO2 (!) 87 %.    FiO2 (%):  [31 %-40 %] 40 %   Intake/Output Summary (Last 24 hours) at 01/04/2020 0732 Last data filed at 01/04/2020 0600 Gross per 24 hour  Intake 711.58 ml  Output 1800 ml  Net -1088.42 ml   Filed Weights   12/30/2019 1046  Weight: 136.1 kg    Examination: General: Chronically ill appearing elderly female lying in bed on BIPAP, in NAD HEENT: [place]/AT, MM pink/moist, PERRL,pressure alleviating dressing to bridge of nose  Neuro: Alert and oriented x2/3, able to answer most orientation questions, non-focal CV: s1s2 regular rate and rhythm, no murmur, rubs, or gallops,  PULM:  Diminished air entry bilaterally, no added breath sounds, tolerating BIPAP well  GI: soft, bowel sounds active in all 4 quadrants, non-tender, non-distended Extremities: warm/dry, no edema  Skin: Multiple wounds with xeroform dressing in place  Resolved Hospital Problem list   Difficult IV access -Right triple lumen IJ placed 4/15  Assessment & Plan:   Acute encephalopathy, suspect toxic metabolic  -In setting of rhabdo, aki. No lab data to suggest infectious -CT H without acute abnormality on admission  P: Minimize sedation  Delirium precautions Encourage sleep wake cycle  Mobilize as able   Acute on chronic  respiratory failure with hypercarbia and hypoxia -COPD on 2LNC at home -OSA  -CXR is actually quite benign from pulm standpoint. BNP is elevated but patient continues fluid restriction and diuresis. With downtime, PE possible but has been getting [facility] inpatient and no evidence of DVT on [place] P: Trial off BIPAP this am, continue with PRN and at HS If able to remain off BIPAP allow for diet  D-dimer slightly elevated, lower extremity doppler negative for DVT Follow CXR and ABG as needed   Atrial Fibrillation P: Continue heparin drip  Continuous telemetry   Chronic diastolic heart failure -ECHO 4/11 with LVEF 55-60%. Atria appear very enlarged -elevated BNP on 4/15 to 1900 Hx PFO P: Repeat ECHO pending  Strict intake and output  Daily weight  Continue IV chlorothiazide   AKI Rhabdomyolysis, improving P: Follow renal function / urine output Trend Bmet Avoid nephrotoxins, ensure adequate renal perfusion  IV hydration  Hypernatremia P: Allow for enteral hydration Trend Bmet   Transaminitis, improved -likely in setting of prolonged downtime, rhabdo. -RUQ without significant hepatic abnormality, some GB sludge  P: Intermittently follow LFTs  Hypothyroidism P: Continue home synthroid   Dysphagia -CT H without evidence of acute CVA  P: SLP following  Continue dysphagia 2 diet   R sided skin breakdown, present on admission -Appreciate WOC consult recs -Dr Sharol Given has evaluated-- no role for debridement or abx at this time  P: Wound care Frequent turns   Best practice:  Diet: NPO Pain/Anxiety/Delirium protocol (if indicated): na VAP protocol (if indicated): na DVT prophylaxis:  GI prophylaxis: Heparin gtt Glucose control: monitor Mobility: PT/OT  Code Status: Full  Family Communication: pending  Disposition: Transfer to ICU 4/15   Labs   CBC: Recent Labs  Lab 12/28/2019 1007 01/07/2020 1423 12/30/19 0329 12/30/19 0329 12/31/19 0324 01/01/20 0420  01/02/20 0449 01/03/20 0613 01/03/20 1813  WBC 6.9  --  6.7  --  3.9* 4.2 5.1 5.8  --   NEUTROABS 5.5  --   --   --   --   --   --   --   --   HGB 12.9   < > 12.8   < > 9.9* 10.4* 10.4* 10.7* 11.9*  HCT 44.9   < > 45.2   < > 33.8* 36.5 35.6* 37.4 35.0*  MCV 106.4*  --  108.4*  --  103.7* 106.7* 104.7* 104.8*  --   PLT 171  --  142*  --  110* 110* 93* 89*  --    < > = values in this interval not displayed.    Basic Metabolic Panel: Recent Labs  Lab 12/31/19 1700 12/31/19 1700 01/01/20 0723 01/02/20 0449 01/03/20 0613 01/03/20 1813 01/04/20 0540  NA 151*   < > 155* 156* 154* 154* 155*  K 3.9   < > 3.2* 3.4* 3.8 3.7 3.7  CL 116*  --  120* 118* 116*  --  116*  CO2 28  --  28 27 25   --  30  GLUCOSE 125*  --  107* 99 104*  --  95  BUN 69*  --  67* 73* 79*  --  79*  CREATININE 2.40*  --  2.19* 2.22* 2.22*  --  2.13*  CALCIUM 8.3*  --  8.0* 8.8* 9.1  --  9.5  MG  --   --  2.1 2.2  --   --   --   PHOS  --   --  3.4  --   --   --   --    < > = values in this interval not displayed.   GFR: Estimated Creatinine Clearance: 33.9 mL/min (A) (by C-G formula based on SCr of 2.13 mg/dL (H)). Recent Labs  Lab 12/30/19 0329 12/30/19 0329 12/30/19 0816 12/30/19 1043 12/31/19 0217 12/31/19 0324 01/01/20 0420 01/02/20 0449 01/03/20 0613  PROCALCITON 0.85  --   --   --  0.69  --   --   --   --   WBC 6.7   < >  --   --   --  3.9* 4.2 5.1 5.8  LATICACIDVEN  --   --  3.1* 3.1*  --   --   --   --   --    < > = values in this interval not displayed.    Liver Function Tests: Recent Labs  Lab 12/31/19 0851 01/01/20 0723 01/02/20 0449 01/03/20 0613 01/04/20 0540  AST 52* 43* 33 28 20  ALT 40 35 36 33 30  ALKPHOS 124 119 139* 129* 144*  BILITOT 2.3* 2.0* 2.0* 1.8* 1.9*  PROT 6.1* 5.5* 6.4* 6.1* 6.1*  ALBUMIN 2.8* 2.7* 3.2* 3.1* 2.8*   Recent Labs  Lab 01/08/2020 1007  LIPASE 19   Recent Labs  Lab 12/30/19 0816  AMMONIA 52*    ABG    Component Value Date/Time   PHART  7.341 (L) 01/03/2020 1813   PCO2ART 53.1 (H) 01/03/2020 1813   PO2ART 109.0 (H) 01/03/2020 1813   HCO3 28.7 (H) 01/03/2020 1813   TCO2 30 01/03/2020 1813   ACIDBASEDEF 1.7 12/31/2019 1305   O2SAT 98.0 01/03/2020 1813     Coagulation Profile: Recent Labs  Lab 01/02/20 0449  INR 1.5*    Cardiac Enzymes: Recent Labs  Lab 12/28/2019 1007 12/30/19 0329 12/31/19 0851 01/02/20 0449  CKTOTAL 2,047* 1,022* 430* 167    HbA1C: Hgb A1c MFr Bld  Date/Time Value Ref Range Status  12/31/2019 08:50 AM 5.1 4.8 - 5.6 % Final    Comment:    (NOTE)         Prediabetes: 5.7 - 6.4         Diabetes: >6.4         Glycemic control for adults with diabetes: <7.0   05/03/2019 10:17 PM 5.2 4.8 - 5.6 % Final    Comment:    (NOTE) Pre diabetes:          5.7%-6.4% Diabetes:              >6.4% Glycemic control for   <7.0% adults with diabetes     CBG: Recent Labs  Lab 01/03/20 1613 01/03/20 1935 01/03/20 2325 01/04/20 0350 01/04/20 0713  GLUCAP 105* 77 72 71 80    Critical care time:    Performed by: Johnsie Cancel  Total critical care time: 35 minutes  Critical care time was exclusive of separately billable procedures and treating other patients. Critical care was necessary to treat or prevent imminent or life-threatening deterioration.  Critical care was time spent personally by me on the following activities: development of treatment plan with  patient and/or surrogate as well as nursing, discussions with consultants, evaluation of patient's response to treatment, examination of patient, obtaining history from patient or surrogate, ordering and performing treatments and interventions, ordering and review of laboratory studies, ordering and review of radiographic studies, pulse oximetry and re-evaluation of patient's condition.   Johnsie Cancel, NP-C [facility] Contact / Pager information can be found on Amion  01/04/2020, 7:59 AM

## 2020-01-04 NOTE — Progress Notes (Signed)
Physical Therapy Treatment Patient Details Name: ANMARIE FUKUSHIMA MRN: [id number] DOB: July 05, 1946 Today's Date: 01/04/2020    History of Present Illness Past medical history of COPD on 2 LPM chronically, chronic respiratory failure, chronic CHF, OSA, A. fib, anemia, CKD 3, hypothyroidism, PPM implant.  Presented with a fall that occurred on Easter Sunday.  Patient lives alone and neighbors noted mailbox was full and packages were not picked up.  Significantly confused with multiple skin breakdowns and hypoxia on arrival.  Found to have acute kidney injury, hyperkalemia, hyponatremia, rhabdomyolysis and hypoxia.    PT Comments    Pt found with eyes open in a glassed over, foggy state.  Every question received a nod "yes"  Emphasis on transition to EOB and sitting balance/tolerance without progression to standing or OOB due to pt's "stupor."   Follow Up Recommendations  SNF;Supervision/Assistance - 24 hour     Equipment Recommendations  None recommended by PT;Other (comment)(TBA)    Recommendations for Other Services       Precautions / Restrictions Precautions Precautions: Fall Precaution Comments: multiple areas of skin breakdown    Mobility  Bed Mobility   Bed Mobility: Rolling;Sidelying to Sit;Sit to Supine Rolling: Total assist;+2 for physical assistance Sidelying to sit: Total assist;HOB elevated   Sit to supine: Total assist;+2 for physical assistance   General bed mobility comments: directional cues and assist up and forward via R UE  Transfers Overall transfer level: Needs assistance               General transfer comment: deferred due to pt's relative stupor.  Ambulation/Gait                 Stairs             Wheelchair Mobility    Modified Rankin (Stroke Patients Only)       Balance Overall balance assessment: Needs assistance Sitting-balance support: Single extremity supported;No upper extremity supported Sitting balance-Leahy  Scale: Fair Sitting balance - Comments: pt could move into and out of BOS without assist at EOB sat EOB for >10 min.  VSS except tachy at 114 bpm                                    Cognition Arousal/Alertness: Awake/alert(unfocused) Behavior During Therapy: Flat affect Overall Cognitive Status: Impaired/Different from baseline                   Orientation Level: Situation;Time;Place Current Attention Level: Focused;Sustained Memory: Decreased short-term memory;Decreased recall of precautions Following Commands: Follows one step commands with increased time Safety/Judgement: Decreased awareness of safety;Decreased awareness of deficits Awareness: Intellectual Problem Solving: Slow processing;Decreased initiation        Exercises Other Exercises Other Exercises: warm up ROM ex to bil LEs at knees/hip x 10 reps    General Comments        Pertinent Vitals/Pain Pain Assessment: Faces Faces Pain Scale: Hurts little more Pain Location: legs and geneneral Pain Descriptors / Indicators: Discomfort;Grimacing;Guarding;Moaning Pain Intervention(s): Monitored during session;Repositioned    Home Living                      Prior Function            PT Goals (current goals can now be found in the care plan section) Acute Rehab PT Goals Patient Stated Goal: to get a Coke PT Goal Formulation: With  patient Time For Goal Achievement: 01/16/20 Potential to Achieve Goals: Good Progress towards PT goals: Not progressing toward goals - comment(pt in a stupor and unable to participate well)    Frequency    Min 2X/week      PT Plan Current plan remains appropriate    Co-evaluation              AM-PAC PT "6 Clicks" Mobility   Outcome Measure  Help needed turning from your back to your side while in a flat bed without using bedrails?: Total Help needed moving from lying on your back to sitting on the side of a flat bed without using  bedrails?: Total Help needed moving to and from a bed to a chair (including a wheelchair)?: Total Help needed standing up from a chair using your arms (e.g., wheelchair or bedside chair)?: Total Help needed to walk in hospital room?: Total Help needed climbing 3-5 steps with a railing? : Total 6 Click Score: 6    End of Session Equipment Utilized During Treatment: Oxygen Activity Tolerance: Patient limited by fatigue;Patient limited by pain Patient left: in bed;with call bell/phone within reach;with bed alarm set;with SCD's reapplied Nurse Communication: Mobility status PT Visit Diagnosis: Other abnormalities of gait and mobility (R26.89);Muscle weakness (generalized) (M62.81);History of falling (Z91.81)     Time: 0569-7948 PT Time Calculation (min) (ACUTE ONLY): 23 min  Charges:  $Therapeutic Activity: 23-37 mins                     01/04/2020  Ginger Carne., PT Acute Rehabilitation Services [phone]  (pager) [phone]  (office)   Tessie Fass Meera Vasco 01/04/2020, 4:49 PM

## 2020-01-05 ENCOUNTER — Inpatient Hospital Stay (HOSPITAL_COMMUNITY): Payer: Medicare HMO

## 2020-01-05 DIAGNOSIS — R4182 Altered mental status, unspecified: Secondary | ICD-10-CM

## 2020-01-05 DIAGNOSIS — E870 Hyperosmolality and hypernatremia: Secondary | ICD-10-CM | POA: Diagnosis not present

## 2020-01-05 DIAGNOSIS — J9621 Acute and chronic respiratory failure with hypoxia: Secondary | ICD-10-CM | POA: Diagnosis not present

## 2020-01-05 DIAGNOSIS — J9622 Acute and chronic respiratory failure with hypercapnia: Secondary | ICD-10-CM | POA: Diagnosis not present

## 2020-01-05 LAB — CBC
HCT: 39 % (ref 36.0–46.0)
Hemoglobin: 10.8 g/dL — ABNORMAL LOW (ref 12.0–15.0)
MCH: 29.3 pg (ref 26.0–34.0)
MCHC: 27.7 g/dL — ABNORMAL LOW (ref 30.0–36.0)
MCV: 105.7 fL — ABNORMAL HIGH (ref 80.0–100.0)
Platelets: 94 10*3/uL — ABNORMAL LOW (ref 150–400)
RBC: 3.69 MIL/uL — ABNORMAL LOW (ref 3.87–5.11)
RDW: 18.8 % — ABNORMAL HIGH (ref 11.5–15.5)
WBC: 4.9 10*3/uL (ref 4.0–10.5)
nRBC: 0.4 % — ABNORMAL HIGH (ref 0.0–0.2)

## 2020-01-05 LAB — GLUCOSE, CAPILLARY
Glucose-Capillary: 100 mg/dL — ABNORMAL HIGH (ref 70–99)
Glucose-Capillary: 106 mg/dL — ABNORMAL HIGH (ref 70–99)
Glucose-Capillary: 79 mg/dL (ref 70–99)
Glucose-Capillary: 82 mg/dL (ref 70–99)
Glucose-Capillary: 92 mg/dL (ref 70–99)
Glucose-Capillary: 94 mg/dL (ref 70–99)

## 2020-01-05 LAB — COMPREHENSIVE METABOLIC PANEL
ALT: 29 U/L (ref 0–44)
AST: 24 U/L (ref 15–41)
Albumin: 2.8 g/dL — ABNORMAL LOW (ref 3.5–5.0)
Alkaline Phosphatase: 163 U/L — ABNORMAL HIGH (ref 38–126)
Anion gap: 13 (ref 5–15)
BUN: 84 mg/dL — ABNORMAL HIGH (ref 8–23)
CO2: 25 mmol/L (ref 22–32)
Calcium: 9.3 mg/dL (ref 8.9–10.3)
Chloride: 116 mmol/L — ABNORMAL HIGH (ref 98–111)
Creatinine, Ser: 2.08 mg/dL — ABNORMAL HIGH (ref 0.44–1.00)
GFR calc Af Amer: 27 mL/min — ABNORMAL LOW (ref >60)
GFR calc non Af Amer: 23 mL/min — ABNORMAL LOW (ref >60)
Glucose, Bld: 106 mg/dL — ABNORMAL HIGH (ref 70–99)
Potassium: 4.1 mmol/L (ref 3.5–5.1)
Sodium: 154 mmol/L — ABNORMAL HIGH (ref 135–145)
Total Bilirubin: 1.4 mg/dL — ABNORMAL HIGH (ref 0.3–1.2)
Total Protein: 6 g/dL — ABNORMAL LOW (ref 6.5–8.1)

## 2020-01-05 LAB — BASIC METABOLIC PANEL
Anion gap: 6 (ref 5–15)
BUN: 79 mg/dL — ABNORMAL HIGH (ref 8–23)
CO2: 31 mmol/L (ref 22–32)
Calcium: 9.1 mg/dL (ref 8.9–10.3)
Chloride: 113 mmol/L — ABNORMAL HIGH (ref 98–111)
Creatinine, Ser: 1.99 mg/dL — ABNORMAL HIGH (ref 0.44–1.00)
GFR calc Af Amer: 28 mL/min — ABNORMAL LOW (ref >60)
GFR calc non Af Amer: 24 mL/min — ABNORMAL LOW (ref >60)
Glucose, Bld: 109 mg/dL — ABNORMAL HIGH (ref 70–99)
Potassium: 3.5 mmol/L (ref 3.5–5.1)
Sodium: 150 mmol/L — ABNORMAL HIGH (ref 135–145)

## 2020-01-05 LAB — HEPARIN LEVEL (UNFRACTIONATED): Heparin Unfractionated: 0.42 IU/mL (ref 0.30–0.70)

## 2020-01-05 MED ORDER — FREE WATER
200.0000 mL | Status: DC
  Administered 2020-01-05 (×2): 200 mL

## 2020-01-05 MED ORDER — FUROSEMIDE 10 MG/ML IJ SOLN
40.0000 mg | Freq: Once | INTRAMUSCULAR | Status: AC
  Administered 2020-01-05: 40 mg via INTRAVENOUS
  Filled 2020-01-05: qty 4

## 2020-01-05 MED ORDER — POTASSIUM CHLORIDE 10 MEQ/50ML IV SOLN
10.0000 meq | INTRAVENOUS | Status: AC
  Administered 2020-01-05 (×4): 10 meq via INTRAVENOUS
  Filled 2020-01-05 (×4): qty 50

## 2020-01-05 MED ORDER — FAMOTIDINE 20 MG PO TABS
20.0000 mg | ORAL_TABLET | Freq: Every day | ORAL | Status: DC
  Administered 2020-01-05 – 2020-01-16 (×10): 20 mg via ORAL
  Filled 2020-01-05 (×10): qty 1

## 2020-01-05 MED ORDER — APIXABAN 5 MG PO TABS
5.0000 mg | ORAL_TABLET | Freq: Two times a day (BID) | ORAL | Status: DC
  Administered 2020-01-05 (×2): 5 mg
  Filled 2020-01-05 (×2): qty 1

## 2020-01-05 MED ORDER — SODIUM CHLORIDE 0.45 % IV SOLN: INTRAVENOUS | Status: DC

## 2020-01-05 MED ORDER — POTASSIUM CHLORIDE CRYS ER 20 MEQ PO TBCR: 20.0000 meq | EXTENDED_RELEASE_TABLET | ORAL | Status: DC

## 2020-01-05 NOTE — Progress Notes (Signed)
Tried placing NG tube per MD order, pt did not tolerate well, O2 saturation dropped to 70s, stable v-tach on monitor. NG removed, patient mentation at baseline, and O2 sats improved. MD aware of no NG access.

## 2020-01-05 NOTE — Progress Notes (Signed)
Patients grey, heart-shaped ring removed off of left ring finger and placed in a denture cup with patient label d/t swelling. Denture cup placed in patient belonging bag with personal clothing. Will pass this on to next receiving nurse who will let daughter know. Reasoning explained to patient.

## 2020-01-05 NOTE — Progress Notes (Signed)
I have taken an interval history, reviewed the chart and examined the patient. I agree with the Advanced Practitioner's note, impression, and recommendations as outlined.   Briefly, 74 year old female with COPD on 2L and OSA who presented after being found down. In ED she had acute on chronic respiratory failure with hypercarbia and hypoxemia, AKI and rhabdomyolysis.  S: Tolerated BiPAP overnight. Off this morning and awake but oriented to self.   Blood pressure 119/62, pulse (!) 106, temperature 97.6 F (36.4 C), temperature source Axillary, resp. rate (!) 28, height 5' 7"  (1.702 m), weight 136.1 kg, SpO2 95 %.  On exam, chronically ill appearing, follows commands, oriented to self. Lungs with diminished breath sounds bilaterally. Heart is irregular rate and rhythm, no murmurs. 3+ lower extremity pitting edema.  BMET    Component Value Date/Time   NA 154 (H) 01/05/2020 0519   NA 148 (H) 05/25/2019 1141   NA 146 (H) 06/08/2016 0824   K 4.1 01/05/2020 0519   K 3.7 06/08/2016 0824   CL 116 (H) 01/05/2020 0519   CL 97 (L) 12/28/2012 0930   CO2 25 01/05/2020 0519   CO2 30 (H) 06/08/2016 0824   GLUCOSE 106 (H) 01/05/2020 0519   GLUCOSE 88 06/08/2016 0824   GLUCOSE 111 (H) 12/28/2012 0930   BUN 84 (H) 01/05/2020 0519   BUN 19 05/25/2019 1141   BUN 8.1 06/08/2016 0824   CREATININE 2.08 (H) 01/05/2020 0519   CREATININE 0.99 07/06/2018 0833   CREATININE 0.82 11/15/2016 1552   CREATININE 1.0 06/08/2016 0824   CALCIUM 9.3 01/05/2020 0519   CALCIUM 9.0 06/08/2016 0824   GFRNONAA 23 (L) 01/05/2020 0519   GFRNONAA 56 (L) 07/06/2018 0833   GFRAA 27 (L) 01/05/2020 0519   GFRAA >60 07/06/2018 6286   Assessment/Plan  Acute on chronic hypoxemic and hypercarbic respiratory failure Acute toxic-metabolic encephalopathy Hypernatremia Rhabdomyolysis - resolved AKI  Mental status is not at baseline. Will place NGT to start FWF. Will also need to continue diuresis. Will need to monitor sodium  level and renal function. Continue BiPAP as needed. CXR now.  The patient is critically ill with multiple organ systems failure and requires high complexity decision making for assessment and support, frequent evaluation and titration of therapies, application of advanced monitoring technologies and extensive interpretation of multiple databases.   Critical Care Time devoted to patient care services described in this note is 33 Minutes. This time reflects time of care of this signee Dr. Rodman Pickle. This critical care time does not reflect procedure time, or teaching time or supervisory time of PA/NP/Med student/Med Resident etc but could involve care discussion time.  Rodman Pickle, M.D. [facility] Pulmonary/Critical Care Medicine 01/05/2020 9:10 AM   Please see Amion for pager number to reach on-call Pulmonary and Critical Care Team.

## 2020-01-05 NOTE — Progress Notes (Signed)
ANTICOAGULATION CONSULT NOTE   Pharmacy Consult for Heparin to apixaban  Indication: atrial fibrillation, VTE risk  Patient Measurements: Height: 5' 7"  (170.2 cm) Weight: 136.1 kg (300 lb) IBW/kg (Calculated) : 61.6 Heparin Dosing Weight: 94.7 kg  Vital Signs: Temp: 97.6 F (36.4 C) (04/17 0717) Temp Source: Axillary (04/17 0717) BP: 119/62 (04/17 0800) Pulse Rate: 106 (04/17 0800)  Labs: Recent Labs    01/03/20 0613 01/03/20 0613 01/03/20 1813 01/04/20 0540 01/05/20 0519  HGB 10.7*   < > 11.9*  --  10.8*  HCT 37.4  --  35.0*  --  39.0  PLT 89*  --   --   --  94*  APTT  --   --   --  108*  --   HEPARINUNFRC  --   --   --  0.72* 0.42  CREATININE 2.22*  --   --  2.13* 2.08*  CKTOTAL  --   --   --  49  --    < > = values in this interval not displayed.    Estimated Creatinine Clearance: 34.8 mL/min (A) (by C-G formula based on SCr of 2.08 mg/dL (H)).   Assessment: 74 yr old female with hx of a fib was found on the floor with pressure ulcers on R side of body. Pt was on apixaban PTA (LD unknown); CT scan negative for bleeding on admission. Pharmacy was consulted to dose IV heparin while pt is NPO.   Other medical hx includes: rhabdo, encephalopathy, OSA, COPD on 2LNC, chronic diastolic heart failure, acute on CKD III, SSS s/p PPM, hypothyroidism, and obesity.  Pt rec'd IV heparin from 7/57/97-2/82/06 (complicated by IV infiltration requiring central line placement; some bleeding was noted from central line insertion site at that time); last heparin infusion rate was 1300 units/hr. Pt has been receiving heparin 5000 units SQ Q 8 hrs since 12/31/19. Dopplers were negative for VTE. PCCM was consulted to see pt, due to worsening respiratory status. Per Dr. Tamala Julian, pt at risk for VTE. Pharmacy is consulted to dose IV heparin for a fib and VTE.   Discussed with CCM today patient can be transitioned back to home eliquis. Will place an NG tube given concerns for her ability to swallow  consistently. Will stop heparin drip when the tube is placed and administer eliquis    Goal of Therapy:  Heparin level 0.3-0.7 units/ml aPTT 66-102 seconds Monitor platelets by anticoagulation protocol: Yes   Plan:  Stop Heparin drip Start home Eliquis 37m BID Monitor daily heparin level and CBC Monitor for signs/symptoms of bleeding  TNicoletta Dress PharmD PGY2 Infectious Disease Pharmacy Resident  01/03/20, 16:46 PM

## 2020-01-05 NOTE — Progress Notes (Signed)
NAME:  [name] [name], MRN:  [id number], DOB:  [birth year], LOS: 7 ADMISSION DATE:  01/05/2020, CONSULTATION DATE:  01/03/20 REFERRING MD:  Bonner Puna - TRH, CHIEF COMPLAINT:  AMS, Found down. Reason for PCCM consult: Respiratory failure requiring BiPAP   Brief History   74 yo F PMH OSA, COPD on 2LNC, chronic diastolic heart failure, Afib, CKD III, SSS s/p PPM, hypothyroidism, obesity, who presented to ED 4/10 with AMS after being found down at home. EMS was dispatched to patient's home after neighbors noticed mail was full. Upon EMS arrival, pt was hypoxic SpO2 80% on RA and had pressure ulcers on R side of body, and endorsed global myalgia. Patient reported that she sustained fall approx 6 days prior to EMS dispatch and was unable to stand self.   In ED, pt hypoxic but VBG not overtly hypercarbic. CK 2000, Cr 2.48. Admitted to [facility].  4/15 patient with progressively worsening resp status, as well as complex medical picture including rhabdo/AKI s/p fluid restriction due to pulm status, elevated BNP despite fluid restriction and ongoing diuresis. PCCM consulted.   Past Medical History  COPD on 2LNC, OSA PFO, SSS s/p PPM, Paroxysmal Atrial Fibrillation, CAD, MI, HFpEF, HTN, HLD Hypothyroidism  Anemia Fatty liver Depression Breast Cancer s/p chemo   Significant Hospital Events   4/10 admitted to [facility] 4/11 PCCM placed IJ CVC 4/14 requiring increasing BiPAP support 4/15 PCCM consult  4/17 back to intermittent BiPAP  Consults:  PCCM   Procedures:  4/11 CVC>>  Significant Diagnostic Tests:  CT H 4/11> R scalp hematoma. Multiple scattered small calcifications in subarachnoid space, stable. No acute intracranial abnormalities.  CXR 4/15 no PTX of pleural effusion. Clear lungs bilaterally. L sided pacemaker. L IJ catheter. Cardiomegaly, stable in appearance.   Micro Data:  4/10 COVID-19 neg 4/10 UC>>NG  Antimicrobials:  n/a  Interim history/subjective:  No overnight events. Noting she does  not feel well this morning but unable to specify why  Objective   Blood pressure (!) 98/57, pulse 84, temperature (!) 97.5 F (36.4 C), temperature source Oral, resp. rate (!) 21, height 5' 7"  (1.702 m), weight 136.1 kg, SpO2 95 %.    FiO2 (%):  [40 %] 40 %   Intake/Output Summary (Last 24 hours) at 01/05/2020 0509 Last data filed at 01/05/2020 0200 Gross per 24 hour  Intake 1509.1 ml  Output 1150 ml  Net 359.1 ml   Filed Weights   12/30/2019 1046  Weight: 136.1 kg    Examination: General: in NAD HEENT: [place] Neuro: alert, oriented to person, place. Non-focal CV: irreg rhythm, reg rate. +3 pitting edema of upper and lower extremity PULM:  Lung sounds diminished. Breathing comfortably on 4L [place]. Speaks in full sentences GI: bs active Extremities: warm Skin: Multiple wounds with xeroform dressing in place. Chronic venous stasis changes in the lower extremities  Resolved Hospital Problem list   Rhabdomyolysis Transaminitis  Assessment & Plan:   Acute encephalopathy, suspect toxic metabolic  A: unclear baseline. If acute, likely due to rhabdo, aki. No lab data to suggest infectious. Overall improving. P: Delirium precautions Encourage sleep wake cycle  Mobilize as able  Acute on chronic respiratory failure with hypercarbia and hypoxia. PFO likely contributing to hypoxia COPD on 2LNC at home OSA  Tolerating intermittent BiPAP use. Breathing comfortably on 4L [place] P: continue with PRN and at HS CXR today  Atrial Fibrillation. On eliquis at home A: remains in afib without RVR P: can transition back to home dose eliquis  and d/c heparin drip  Chronic diastolic heart failure Moderate pulm artery hypertension -ECHO 4/11 with LVEF 55-60%. Atria appear very enlarged -elevated BNP on 4/15 to 1900 PFO A: appears fairly hypervolemic this morning. Only up about 1L from admission. I/O about even over the past 3d.  P: Continue IV chlorothiazide  One time dose 84m lasix Strict  intake and output , Daily weight   AKI. Improving  Rhabdomyolysis resolved. Hypernatremia. 6.1L free water deficit. A: continues to have good UOP. Will hold off on giving more fluids given her overloaded hemodynamic status P: Will place NG for free water administration--[id number] Follow renal function / urine output Avoid nephrotoxins, ensure adequate renal perfusion   Hypothyroidism P: Continue home synthroid   Dysphagia P: Continue dysphagia 2 diet   R sided skin breakdown, present on admission -Appreciate WOC consult recs -Dr DuSharol Givenas evaluated-- no role for debridement or abx at this time  P: wound care, frequent turns.  Best practice:  Diet: dysphagia 2 Pain/Anxiety/Delirium protocol (if indicated): na VAP protocol (if indicated): na DVT prophylaxis: heparin gtt GI prophylaxis: pepcid Glucose control: monitor Mobility: PT/OT  Code Status: Full  Family Communication: pending  Disposition:   RyMitzi HansenMD Internal Medicine Resident PGY1 Pager # [phone]/17/21  5:10 AM

## 2020-01-05 NOTE — Progress Notes (Signed)
[facility] Progress Note Patient Name: [name] [name] DOB: May 17, 1946 MRN: [id number]   Date of Service  01/05/2020  HPI/Events of Note  Patient getting free water per tube and K+ PO. Unable to place gastric tube and too weak to swallow.   eICU Interventions  Will order: 1. 0.45 NaCl IV infusion to run at 75 mL/hour. 2. Replace KCl via central line.      Intervention Category Major Interventions: Electrolyte abnormality - evaluation and management  Shonte Beutler [place] 01/05/2020, 8:14 PM

## 2020-01-06 DIAGNOSIS — N179 Acute kidney failure, unspecified: Secondary | ICD-10-CM | POA: Diagnosis not present

## 2020-01-06 DIAGNOSIS — E870 Hyperosmolality and hypernatremia: Secondary | ICD-10-CM | POA: Diagnosis not present

## 2020-01-06 LAB — COMPREHENSIVE METABOLIC PANEL
ALT: 25 U/L (ref 0–44)
AST: 29 U/L (ref 15–41)
Albumin: 2.6 g/dL — ABNORMAL LOW (ref 3.5–5.0)
Alkaline Phosphatase: 177 U/L — ABNORMAL HIGH (ref 38–126)
Anion gap: 6 (ref 5–15)
BUN: 78 mg/dL — ABNORMAL HIGH (ref 8–23)
CO2: 31 mmol/L (ref 22–32)
Calcium: 9.1 mg/dL (ref 8.9–10.3)
Chloride: 112 mmol/L — ABNORMAL HIGH (ref 98–111)
Creatinine, Ser: 2.02 mg/dL — ABNORMAL HIGH (ref 0.44–1.00)
GFR calc Af Amer: 28 mL/min — ABNORMAL LOW (ref >60)
GFR calc non Af Amer: 24 mL/min — ABNORMAL LOW (ref >60)
Glucose, Bld: 95 mg/dL (ref 70–99)
Potassium: 4.1 mmol/L (ref 3.5–5.1)
Sodium: 149 mmol/L — ABNORMAL HIGH (ref 135–145)
Total Bilirubin: 1.1 mg/dL (ref 0.3–1.2)
Total Protein: 5.9 g/dL — ABNORMAL LOW (ref 6.5–8.1)

## 2020-01-06 LAB — BASIC METABOLIC PANEL
Anion gap: 7 (ref 5–15)
BUN: 76 mg/dL — ABNORMAL HIGH (ref 8–23)
CO2: 30 mmol/L (ref 22–32)
Calcium: 9.1 mg/dL (ref 8.9–10.3)
Chloride: 111 mmol/L (ref 98–111)
Creatinine, Ser: 1.98 mg/dL — ABNORMAL HIGH (ref 0.44–1.00)
GFR calc Af Amer: 28 mL/min — ABNORMAL LOW (ref >60)
GFR calc non Af Amer: 24 mL/min — ABNORMAL LOW (ref >60)
Glucose, Bld: 108 mg/dL — ABNORMAL HIGH (ref 70–99)
Potassium: 3.9 mmol/L (ref 3.5–5.1)
Sodium: 148 mmol/L — ABNORMAL HIGH (ref 135–145)

## 2020-01-06 LAB — GLUCOSE, CAPILLARY
Glucose-Capillary: 101 mg/dL — ABNORMAL HIGH (ref 70–99)
Glucose-Capillary: 117 mg/dL — ABNORMAL HIGH (ref 70–99)
Glucose-Capillary: 66 mg/dL — ABNORMAL LOW (ref 70–99)
Glucose-Capillary: 74 mg/dL (ref 70–99)
Glucose-Capillary: 97 mg/dL (ref 70–99)

## 2020-01-06 LAB — CBC
HCT: 35 % — ABNORMAL LOW (ref 36.0–46.0)
Hemoglobin: 10 g/dL — ABNORMAL LOW (ref 12.0–15.0)
MCH: 29.9 pg (ref 26.0–34.0)
MCHC: 28.6 g/dL — ABNORMAL LOW (ref 30.0–36.0)
MCV: 104.8 fL — ABNORMAL HIGH (ref 80.0–100.0)
Platelets: 115 10*3/uL — ABNORMAL LOW (ref 150–400)
RBC: 3.34 MIL/uL — ABNORMAL LOW (ref 3.87–5.11)
RDW: 18.5 % — ABNORMAL HIGH (ref 11.5–15.5)
WBC: 4.3 10*3/uL (ref 4.0–10.5)
nRBC: 0 % (ref 0.0–0.2)

## 2020-01-06 MED ORDER — FUROSEMIDE 10 MG/ML IJ SOLN
40.0000 mg | Freq: Once | INTRAMUSCULAR | Status: AC
  Administered 2020-01-06: 40 mg via INTRAVENOUS
  Filled 2020-01-06: qty 4

## 2020-01-06 MED ORDER — FOLIC ACID 1 MG PO TABS
1.0000 mg | ORAL_TABLET | Freq: Every day | ORAL | Status: DC
  Administered 2020-01-06 – 2020-01-16 (×9): 1 mg via ORAL
  Filled 2020-01-06 (×9): qty 1

## 2020-01-06 MED ORDER — VITAMIN B-12 1000 MCG PO TABS
1000.0000 ug | ORAL_TABLET | Freq: Every day | ORAL | Status: DC
  Administered 2020-01-06 – 2020-01-16 (×9): 1000 ug via ORAL
  Filled 2020-01-06 (×10): qty 1

## 2020-01-06 MED ORDER — FREE WATER
200.0000 mL | Status: DC
  Administered 2020-01-06 – 2020-01-15 (×37): 200 mL via ORAL

## 2020-01-06 MED ORDER — APIXABAN 5 MG PO TABS
5.0000 mg | ORAL_TABLET | Freq: Two times a day (BID) | ORAL | Status: DC
  Administered 2020-01-06 – 2020-01-16 (×18): 5 mg via ORAL
  Filled 2020-01-06 (×18): qty 1

## 2020-01-06 NOTE — Progress Notes (Signed)
NAME:  [name] [name], MRN:  [id number], DOB:  04-22-1946, LOS: 8 ADMISSION DATE:  12/21/2019, CONSULTATION DATE:  01/03/20 REFERRING MD:  Bonner Puna - TRH, CHIEF COMPLAINT:  AMS, Found down. Reason for PCCM consult: Respiratory failure requiring BiPAP   Brief History   74 yo F PMH OSA, COPD on 2LNC, chronic diastolic heart failure, Afib, CKD III, SSS s/p PPM, hypothyroidism, obesity, who presented to ED 4/10 with AMS after being found down at home. EMS was dispatched to patient's home after neighbors noticed mail was full. Upon EMS arrival, pt was hypoxic SpO2 80% on RA and had pressure ulcers on R side of body, and endorsed global myalgia. Patient reported that she sustained fall approx 6 days prior to EMS dispatch and was unable to stand self.   In ED, pt hypoxic but VBG not overtly hypercarbic. CK 2000, Cr 2.48. Admitted to [facility].  4/15 patient with progressively worsening resp status, as well as complex medical picture including rhabdo/AKI s/p fluid restriction due to pulm status, elevated BNP despite fluid restriction and ongoing diuresis. PCCM consulted.   Past Medical History  COPD on 2LNC, OSA PFO, SSS s/p PPM, Paroxysmal Atrial Fibrillation, CAD, MI, HFpEF, HTN, HLD Hypothyroidism  Anemia Fatty liver Depression Breast Cancer s/p chemo   Significant Hospital Events   4/10 admitted to [facility] 4/11 PCCM placed IJ CVC 4/14 requiring increasing BiPAP support 4/15 PCCM consult  4/17 back to intermittent BiPAP  Consults:  PCCM   Procedures:  4/11 CVC>>  Significant Diagnostic Tests:  CT H 4/11> R scalp hematoma. Multiple scattered small calcifications in subarachnoid space, stable. No acute intracranial abnormalities.  CXR 4/15 no PTX of pleural effusion. Clear lungs bilaterally. L sided pacemaker. L IJ catheter. Cardiomegaly, stable in appearance.   Micro Data:  4/10 COVID-19 neg 4/10 UC>>NG  Antimicrobials:  n/a  Interim history/subjective:  Chatted with pt's daughter yesterday  afternoon who notes that patient's mental status has been declining since march. She has gone to the court to obtain IVC paperwork and is working towards guardianship due to this.  Objective   Blood pressure (!) 102/37, pulse 79, temperature 98.5 F (36.9 C), temperature source Oral, resp. rate 13, height 5' 7"  (1.702 m), weight 136.1 kg, SpO2 100 %.    FiO2 (%):  [40 %] 40 %   Intake/Output Summary (Last 24 hours) at 01/06/2020 0600 Last data filed at 01/06/2020 0500 Gross per 24 hour  Intake 4257.87 ml  Output 1600 ml  Net 2657.87 ml   Filed Weights   12/20/2019 1046  Weight: 136.1 kg    Examination: General: in NAD HEENT: BiPAP on Neuro: awake. Follows some commands CV: RRR +3 pitting edema of upper and lower extremity PULM:  Mechanical sounds with BiPAP.   GI: bs active Extremities: warm Skin: Multiple wounds with xeroform dressing in place. Chronic venous stasis changes in the lower extremities  Resolved Hospital Problem list   Rhabdomyolysis Transaminitis  Assessment & Plan:   Encephalopathy, suspect toxic metabolic. Likely acute on chronic. A: patient's daughter notes this is her baseline since March. Suspect that her hypernatremia and hypercapnia also are contributing factors. P: Will continue to work on metabolic causes.  Delirium precautions.  Acute on chronic respiratory failure with hypercarbia and hypoxia. PFO likely contributing to hypoxia COPD on 2LNC at home OSA  Tolerating intermittent BiPAP use.  P: BiPAP prn and HS Brovana, pulmicort, prn albuterol nebs  AKI. stable Hypernatremia. improved A: continues to have good UOP. Attempted to  place NG for free water administration yesterday however patient unable to tolerate. P: Encourage po free water for fluids.  Follow renal function / urine output Avoid nephrotoxins, ensure adequate renal perfusion   Chronic diastolic heart failure Moderate pulm artery hypertension -ECHO 4/11 with LVEF 55-60% with  atrial enlargement -elevated BNP on 4/15 to 1900 PFO A: remains hypervolemic appearing this morning. Had good UOP with diuresis yesterday P: 31m Lasix Strict intake and output , Daily weight   Atrial Fibrillation.  A: back in sinus rhythm this morning. P: continue eliquis  Macrocytic anemia.  A: Stable. Suspect nutritionally related. P: b12 and folate supplementation  Hypothyroidism P: Continue home synthroid   Dysphagia P: Continue dysphagia 2 diet   R sided skin breakdown, present on admission -Appreciate WOC consult recs -Dr DSharol Givenhas evaluated-- no role for debridement or abx at this time  P: wound care, frequent turns.  Best practice:  Diet: dysphagia 2 Pain/Anxiety/Delirium protocol (if indicated): na VAP protocol (if indicated): na DVT prophylaxis: heparin gtt GI prophylaxis: pepcid Glucose control: monitor Mobility: PT/OT  Code Status: Full  Family Communication: daughter updated yesterday Disposition: likely stable for transfer to hospitalist service today  RMitzi Hansen MD Internal Medicine Resident PGY1 Pager # [phone]/18/21  6:00 AM

## 2020-01-06 NOTE — Progress Notes (Deleted)
[facility]   [phone] [phone]   Clinic Follow up Note   Patient Care Team: Biagio Borg, MD as PCP - General (Internal Medicine) Stanford Breed Denice Bors, MD as PCP - Cardiology (Cardiology) Tania Ade, Tomasa Blase, RN as [facility] 01/06/2020  CHIEF COMPLAINT:   SUMMARY OF ONCOLOGIC HISTORY: Oncology History   No history exists.    CURRENT THERAPY:   INTERVAL HISTORY:   REVIEW OF SYSTEMS:   Constitutional: Denies fevers, chills or abnormal weight loss Eyes: Denies blurriness of vision Ears, nose, mouth, throat, and face: Denies mucositis or sore throat Respiratory: Denies cough, dyspnea or wheezes Cardiovascular: Denies palpitation, chest discomfort or lower extremity swelling Gastrointestinal:  Denies nausea, heartburn or change in bowel habits Skin: Denies abnormal skin rashes Lymphatics: Denies new lymphadenopathy or easy bruising Neurological:Denies numbness, tingling or new weaknesses Behavioral/Psych: Mood is stable, no new changes  All other systems were reviewed with the patient and are negative.  MEDICAL HISTORY:  Past Medical History:  Diagnosis Date  . Anemia   . Asthma 06/13/2011   hx of  . Atrial fibrillation ([place])   . Atrial flutter ([place])   . Breast cancer ([place]) dx'd 2005   No BP check or stick in Left arm  . CAD (coronary artery disease)    a. Nonobstructive CAD 2007 (EF 75%.  RCA  proximal 75% tubular, 25% prox LAD, 25% d1, 50% D2) at [facility]. b. NSTEMI 01/2010 in setting of respiratory failure, medical approach (not a good candidate for noninvasive eval)  . Cellulitis    hx of cellulitis in left leg  . Cervical radiculopathy 06/13/2011  . CHF (congestive heart failure) ([place])   . Complication of anesthesia    per patient "hard to wake up and come out of the aneshthesia, happened years ago"  . COPD (chronic obstructive pulmonary disease) ([place])    on O2  . Depression 06/13/2011  .  Diverticulosis   . Esophageal dysmotility   . Fatty liver   . Gout 06/13/2011  . History of Clostridium difficile early dec 2015   no current diarrhea  . History of home oxygen therapy    2 liters per nasal cannula all the time  . Hx of dysfunctional uterine bleeding    last bleeding jan 2016, worked up for with d and c x 2 no cause found  . Hyperlipidemia   . Hypertension   . Hypothyroidism   . Impaired glucose tolerance 11/01/2012  . Myocardial infarction ([place])   . Neuropathy    per patient in hands and in feet  . Obesity hypoventilation syndrome ([facility]) 06/13/2011  . OSA on CPAP   . Personal history of chemotherapy   . Personal history of radiation therapy   . PFO (patent foramen ovale)   . Presence of permanent cardiac pacemaker   . Risk for falls   . Syncope and collapse    Bradycardia with pauses. S/P pacemaker  . Tubular adenoma of colon     SURGICAL HISTORY: Past Surgical History:  Procedure Laterality Date  . APPENDECTOMY    . BREAST BIOPSY    . BREAST LUMPECTOMY Left 05/2004  . CARDIOVERSION N/A 03/15/2013   Procedure: CARDIOVERSION;  Surgeon: Thayer Headings, MD;  Location: [facility] ENDOSCOPY;  Service: Cardiovascular;  Laterality: N/A;  . CERVICAL BIOPSY  June 2013   at [facility] for Heavy  Bleeding  . [place] SURGERY  2004 or 2005   have cadaver bones,, screws, and  plates c 5 to c 6  . COLONOSCOPY    . COLONOSCOPY WITH PROPOFOL N/A 10/22/2014   Procedure: COLONOSCOPY WITH PROPOFOL;  Surgeon: Jerene Bears, MD;  Location: [facility] ENDOSCOPY;  Service: Gastroenterology;  Laterality: N/A;  . DILATION AND CURETTAGE OF UTERUS    . EPIGASTRIC HERNIA REPAIR    . EPIGASTRIC HERNIA REPAIR N/A 10/29/2019   Procedure: EPIGASTRIC HERNIA REPAIR WITH MESH;  Surgeon: Coralie Keens, MD;  Location: [facility];  Service: General;  Laterality: N/A;  LMA ANESTHESIA  . EYE SURGERY    . LAPAROSCOPIC APPENDECTOMY  03/29/2012   Procedure: APPENDECTOMY LAPAROSCOPIC;  Surgeon: Adin Hector, MD;   Location: [facility] ORS;  Service: General;  Laterality: N/A;  . LOOP RECORDER EXPLANT  08/31/2013   Procedure: LOOP RECORDER EXPLANT;  Surgeon: Coralyn Mark, MD;  Location: [facility] CATH LAB;  Service: Cardiovascular;;  . LOOP RECORDER IMPLANT  08-28-2013; 08-31-2013   MDT LinQ implanted by Dr Rayann Heman for syncope; explanted 08-31-2013 after sinus pauses identified  . LOOP RECORDER IMPLANT N/A 08/28/2013   Procedure: LOOP RECORDER IMPLANT;  Surgeon: Coralyn Mark, MD;  Location: [facility] CATH LAB;  Service: Cardiovascular;  Laterality: N/A;  . OVARIAN CYST REMOVAL    . PACEMAKER INSERTION  08-31-2013   Biotronik Evera dual chamber pacemaker placed for neurocardiogenic syncope and sinus pauses by Dr Rayann Heman  . PERMANENT PACEMAKER INSERTION N/A 08/31/2013   Procedure: PERMANENT PACEMAKER INSERTION;  Surgeon: Coralyn Mark, MD;  Location: [facility] CATH LAB;  Service: Cardiovascular;  Laterality: N/A;  . TEE WITHOUT CARDIOVERSION N/A 03/15/2013   Procedure: TRANSESOPHAGEAL ECHOCARDIOGRAM (TEE);  Surgeon: Thayer Headings, MD;  Location: [facility] ENDOSCOPY;  Service: Cardiovascular;  Laterality: N/A;    I have reviewed the social history and family history with the patient and they are unchanged from previous note.  ALLERGIES:  is allergic to cephalexin; ciprofloxacin; lisinopril; codeine; latex; tape; cleocin [clindamycin hcl]; dilaudid [hydromorphone]; doxycycline; and penicillins.  MEDICATIONS:  No current facility-administered medications for this visit.   No current outpatient medications on file.   Facility-Administered Medications Ordered in Other Visits  Medication Dose Route Frequency Provider Last Rate Last Admin  . acetaminophen (TYLENOL) tablet 650 mg  650 mg Oral Q6H PRN Wynetta Fines T, MD   650 mg at 01/05/20 2201   Or  . acetaminophen (TYLENOL) suppository 650 mg  650 mg Rectal Q6H PRN Wynetta Fines T, MD      . albuterol (PROVENTIL) (2.5 MG/3ML) 0.083% nebulizer solution 2.5 mg  2.5 mg Inhalation Q6H PRN Wynetta Fines T, MD      . apixaban Arne Cleveland) tablet 5 mg  5 mg Oral BID Margaretha Seeds, MD   5 mg at 01/06/20 2202  . arformoterol (BROVANA) nebulizer solution 15 mcg  15 mcg Nebulization BID Lavina Hamman, MD   15 mcg at 01/06/20 0827  . budesonide (PULMICORT) nebulizer solution 0.25 mg  0.25 mg Nebulization BID Lavina Hamman, MD   0.25 mg at 01/06/20 0827  . Chlorhexidine Gluconate Cloth 2 % PADS 6 each  6 each Topical Daily Lavina Hamman, MD   6 each at 01/05/20 1000  . dextrose 10 % infusion   Intravenous Continuous Stretch, Marily Lente, MD   Stopped at 01/06/20 [phone]  . dextrose 50 % solution 50 mL  1 ampule Intravenous PRN Lavina Hamman, MD      . famotidine (PEPCID) tablet 20 mg  20 mg Oral Daily Candee Furbish, MD  20 mg at 01/06/20 0939  . folic acid (FOLVITE) tablet 1 mg  1 mg Oral Daily Darrick Meigs, Rylee, MD   1 mg at 01/06/20 0938  . free water 200 mL  200 mL Oral Q4H Margaretha Seeds, MD      . levothyroxine (SYNTHROID) tablet 200 mcg  200 mcg Oral Q0600 Rigoberto Noel, MD   200 mcg at 01/06/20 0539  . ondansetron (ZOFRAN) injection 4 mg  4 mg Intravenous Q6H PRN Wynetta Fines T, MD      . oxyCODONE (Oxy IR/ROXICODONE) immediate release tablet 5 mg  5 mg Oral Q4H PRN Lavina Hamman, MD   5 mg at 01/06/20 0033  . sodium chloride flush (NS) 0.9 % injection 10-40 mL  10-40 mL Intracatheter Q12H Lavina Hamman, MD   10 mL at 01/06/20 0936  . vitamin B-12 (CYANOCOBALAMIN) tablet 1,000 mcg  1,000 mcg Oral Daily Mitzi Hansen, MD   1,000 mcg at 01/06/20 9924    PHYSICAL EXAMINATION: ECOG PERFORMANCE STATUS: {CHL ONC ECOG PS:[phone]}  There were no vitals filed for this visit. There were no vitals filed for this visit.  GENERAL:alert, no distress and comfortable SKIN: skin color, texture, turgor are normal, no rashes or significant lesions EYES: normal, Conjunctiva are pink and non-injected, sclera clear OROPHARYNX:no exudate, no erythema and lips, buccal mucosa, and tongue  normal  NECK: supple, thyroid normal size, non-tender, without nodularity LYMPH:  no palpable lymphadenopathy in the cervical, axillary or inguinal LUNGS: clear to auscultation and percussion with normal breathing effort HEART: regular rate & rhythm and no murmurs and no lower extremity edema ABDOMEN:abdomen soft, non-tender and normal bowel sounds Musculoskeletal:no cyanosis of digits and no clubbing  NEURO: alert & oriented x 3 with fluent speech, no focal motor/sensory deficits  LABORATORY DATA:  I have reviewed the data as listed CBC Latest Ref Rng & Units 01/06/2020 01/05/2020 01/03/2020  WBC 4.0 - 10.5 K/uL 4.3 4.9 -  Hemoglobin 12.0 - 15.0 g/dL 10.0(L) 10.8(L) 11.9(L)  Hematocrit 36.0 - 46.0 % 35.0(L) 39.0 35.0(L)  Platelets 150 - 400 K/uL 115(L) 94(L) -     CMP Latest Ref Rng & Units 01/06/2020 01/05/2020 01/05/2020  Glucose 70 - 99 mg/dL 95 109(H) 106(H)  BUN 8 - 23 mg/dL 78(H) 79(H) 84(H)  Creatinine 0.44 - 1.00 mg/dL 2.02(H) 1.99(H) 2.08(H)  Sodium 135 - 145 mmol/L 149(H) 150(H) 154(H)  Potassium 3.5 - 5.1 mmol/L 4.1 3.5 4.1  Chloride 98 - 111 mmol/L 112(H) 113(H) 116(H)  CO2 22 - 32 mmol/L 31 31 25   Calcium 8.9 - 10.3 mg/dL 9.1 9.1 9.3  Total Protein 6.5 - 8.1 g/dL 5.9(L) - 6.0(L)  Total Bilirubin 0.3 - 1.2 mg/dL 1.1 - 1.4(H)  Alkaline Phos 38 - 126 U/L 177(H) - 163(H)  AST 15 - 41 U/L 29 - 24  ALT 0 - 44 U/L 25 - 29      RADIOGRAPHIC STUDIES: I have personally reviewed the radiological images as listed and agreed with the findings in the report. DG CHEST PORT 1 VIEW  Result Date: 01/05/2020 CLINICAL DATA:  Hypoxia EXAM: PORTABLE CHEST 1 VIEW COMPARISON:  Chest x-rays dated 01/03/2020 and 01/02/2020. FINDINGS: Stable cardiomegaly. Mild central pulmonary vascular congestion, stable. No new lung findings. No pleural effusion or pneumothorax. LEFT chest wall pacemaker/ICD apparatus appears stable in position. RIGHT IJ central line in place with tip at the level of the  mid/lower SVC. IMPRESSION: 1. Stable cardiomegaly with mild central pulmonary vascular congestion suggesting  mild volume overload/CHF. 2. No new lung findings. No evidence of pneumonia. Electronically Signed   By: Franki Cabot M.D.   On: 01/05/2020 15:37     ASSESSMENT & PLAN:  No problem-specific Assessment & Plan notes found for this encounter.   No orders of the defined types were placed in this encounter.  All questions were answered. The patient knows to call the clinic with any problems, questions or concerns. No barriers to learning was detected. I spent {CHL ONC TIME VISIT - EQFDV:[id number]} counseling the patient face to face. The total time spent in the appointment was {CHL ONC TIME VISIT - VYXAJ:[id number]} and more than 50% was on counseling and review of test results     Alla Feeling, NP 01/06/20

## 2020-01-07 ENCOUNTER — Telehealth: Payer: Self-pay

## 2020-01-07 ENCOUNTER — Inpatient Hospital Stay: Payer: Medicare HMO | Attending: Internal Medicine

## 2020-01-07 ENCOUNTER — Inpatient Hospital Stay: Payer: Medicare HMO | Admitting: Nurse Practitioner

## 2020-01-07 DIAGNOSIS — E870 Hyperosmolality and hypernatremia: Secondary | ICD-10-CM | POA: Diagnosis not present

## 2020-01-07 LAB — COMPREHENSIVE METABOLIC PANEL
ALT: 26 U/L (ref 0–44)
AST: 28 U/L (ref 15–41)
Albumin: 2.6 g/dL — ABNORMAL LOW (ref 3.5–5.0)
Alkaline Phosphatase: 173 U/L — ABNORMAL HIGH (ref 38–126)
Anion gap: 9 (ref 5–15)
BUN: 78 mg/dL — ABNORMAL HIGH (ref 8–23)
CO2: 29 mmol/L (ref 22–32)
Calcium: 9.4 mg/dL (ref 8.9–10.3)
Chloride: 108 mmol/L (ref 98–111)
Creatinine, Ser: 2.08 mg/dL — ABNORMAL HIGH (ref 0.44–1.00)
GFR calc Af Amer: 27 mL/min — ABNORMAL LOW (ref >60)
GFR calc non Af Amer: 23 mL/min — ABNORMAL LOW (ref >60)
Glucose, Bld: 97 mg/dL (ref 70–99)
Potassium: 4.1 mmol/L (ref 3.5–5.1)
Sodium: 146 mmol/L — ABNORMAL HIGH (ref 135–145)
Total Bilirubin: 0.9 mg/dL (ref 0.3–1.2)
Total Protein: 5.9 g/dL — ABNORMAL LOW (ref 6.5–8.1)

## 2020-01-07 LAB — GLUCOSE, CAPILLARY
Glucose-Capillary: 74 mg/dL (ref 70–99)
Glucose-Capillary: 62 mg/dL — ABNORMAL LOW (ref 70–99)
Glucose-Capillary: 96 mg/dL (ref 70–99)
Glucose-Capillary: 108 mg/dL — ABNORMAL HIGH (ref 70–99)
Glucose-Capillary: 135 mg/dL — ABNORMAL HIGH (ref 70–99)
Glucose-Capillary: 111 mg/dL — ABNORMAL HIGH (ref 70–99)
Glucose-Capillary: 103 mg/dL — ABNORMAL HIGH (ref 70–99)
Glucose-Capillary: 100 mg/dL — ABNORMAL HIGH (ref 70–99)

## 2020-01-07 LAB — CBC
HCT: 35.3 % — ABNORMAL LOW (ref 36.0–46.0)
Hemoglobin: 10.1 g/dL — ABNORMAL LOW (ref 12.0–15.0)
MCH: 30 pg (ref 26.0–34.0)
MCHC: 28.6 g/dL — ABNORMAL LOW (ref 30.0–36.0)
MCV: 104.7 fL — ABNORMAL HIGH (ref 80.0–100.0)
Platelets: 122 10*3/uL — ABNORMAL LOW (ref 150–400)
RBC: 3.37 MIL/uL — ABNORMAL LOW (ref 3.87–5.11)
RDW: 18.2 % — ABNORMAL HIGH (ref 11.5–15.5)
WBC: 4.9 10*3/uL (ref 4.0–10.5)
nRBC: 0.4 % — ABNORMAL HIGH (ref 0.0–0.2)

## 2020-01-07 MED ORDER — ENSURE ENLIVE PO LIQD
237.0000 mL | Freq: Three times a day (TID) | ORAL | Status: DC
  Administered 2020-01-07 – 2020-01-15 (×17): 237 mL via ORAL

## 2020-01-07 MED ORDER — DEXTROSE 10 % IV SOLN: INTRAVENOUS | Status: DC

## 2020-01-07 MED ORDER — FUROSEMIDE 10 MG/ML IJ SOLN
40.0000 mg | Freq: Two times a day (BID) | INTRAMUSCULAR | Status: DC
  Administered 2020-01-07 (×2): 40 mg via INTRAVENOUS
  Filled 2020-01-07 (×2): qty 4

## 2020-01-07 MED ORDER — THIAMINE HCL 100 MG PO TABS
100.0000 mg | ORAL_TABLET | Freq: Every day | ORAL | Status: DC
  Administered 2020-01-07 – 2020-01-16 (×8): 100 mg via ORAL
  Filled 2020-01-07 (×8): qty 1

## 2020-01-07 NOTE — Progress Notes (Addendum)
VAST consulted to obtain PIV access so that central line can be discontinued. Pt with restricted left arm. Assessed right arm utilizing ultrasound. 2 small vessels noted in lower right arm, but pitting edema also noted. Due to edema and limited vasculature, upper arm assessed for possible midline. Cephalic vein too small. Both Basilic and Brachial veins too deep for access. Recommend leaving CVC in place at this time.  Notified pt's nurse, Cori of findings.

## 2020-01-07 NOTE — Progress Notes (Signed)
Nutrition Follow-up  RD working remotely.  DOCUMENTATION CODES:   Morbid obesity  INTERVENTION:   -Hormel Shake TID, each supplement provides 520 kcals and 22 grams protein -Magic cup TID with meals, each supplement provides 290 kcal and 9 grams of protein -Ensure Enlive po TID, each supplement provides 350 kcal and 20 grams of protein  NUTRITION DIAGNOSIS:   Inadequate oral intake related to poor appetite as evidenced by meal completion < 50%.  Ongoing  GOAL:   Patient will meet greater than or equal to 90% of their needs  Progressing   MONITOR:   PO intake, Supplement acceptance, Weight trends, Skin  REASON FOR ASSESSMENT:   Consult Wound healing  ASSESSMENT:   Pt with a PMH significant for COPD on 2L oxygen chronically, chronic respiratory failure, CHF, OSA, A. Fib, anemia, CKD 3, hypothyroidism, PPM implant. Pt presented s/p fall and was admitted with rhabdomyolysis, AKI on CKD stage 3, acute metabolic encephalopathy, hyperkalemia and hypernatremia.  Reviewed I/O's: +1.1 L x 24 hours and +4.5 L since admission  UOP: 1.2 L x 24 hours  4/13- advanced to dysphagia 1 diet with thin liquids 4/14- advanced to dysphagia 2 diet with thin liquids  Pt requiring Bi-pap due to respiratory distress.   Pt now on a dysphagia 2 diet with thin liquids. Intake is variable, however, usually averaging around 40-50%.  Per chart review, pt has been IVC'd. Plan to d/c to SNF once medically stable.  Medications reviewed and include folic acid, vitamin L-59, and dextrose 10% infusion @ 10 ml/hr.   Labs reviewed: Na: 146, CBGS: 62-108.   Diet Order:   Diet Order            DIET DYS 2 Room service appropriate? Yes; Fluid consistency: Thin  Diet effective now              EDUCATION NEEDS:   No education needs have been identified at this time  Skin:  Skin Assessment: Skin Integrity Issues: Skin Integrity Issues:: Other (Comment) Other: non pressure wound on rt foot;  MASD to coccyx, groin, and perineum  Last BM:  01/05/20  Height:   Ht Readings from Last 1 Encounters:  12/25/2019 5' 7"  (1.702 m)    Weight:   Wt Readings from Last 1 Encounters:  01/15/2020 136.1 kg   BMI:  Body mass index is 46.99 kg/m.  Estimated Nutritional Needs:   Kcal:  2100-2300  Protein:  120-135 grams  Fluid:  > 2.2 L    Loistine Chance, RD, LDN, [place] Registered Dietitian II Certified Diabetes Care and Education Specialist Please refer to [facility] for RD and/or RD on-call/weekend/after hours pager

## 2020-01-07 NOTE — Progress Notes (Signed)
  Speech Language Pathology Treatment: Dysphagia  Patient Details Name: MAEVYN RIORDAN MRN: [id number] DOB: 06-04-1946 Today's Date: 01/07/2020 Time: 3716-9678 SLP Time Calculation (min) (ACUTE ONLY): 13 min  Assessment / Plan / Recommendation Clinical Impression  Pt eating breakfast with student RN when SLP arrived. Assisted with repositioning and encouraged self feeding providing set up assist if pt able. Pt successfully managed cup with straw/lid and utensil once right arm repositioned. She demonstrated continued mild dyspnea during meals and stopped to rest and consumed large consecutive sips of thin with straw. Educated pt how/why smaller sips are safest. She consumed minimal amount of cream of wheat declining her eggs and sausage. Continue Dys 2 texture and will continue to follow to upgrade textures. Uncertain if pt does not care for the hospitals food versus low appetite in general.       HPI HPI: Keeli JANILAH HOJNACKI is a 74 y.o. female with medical history significant of COPD chronically on 2 L oxygen, chronic diastolic CHF, sleep apnea, paroxysmal A. fib, chronic anemia, CKD stage III with baseline creatinine of 1.3, sick sinus syndrome status post PPM, hypothyroidism presented with Fall.  Patient lives by herself, and she is morbidly obese and had a fall episode about 6 days ago.  Patient was not able to stand up herself and she stayed on lying for the last 6 days.  Head CT was negative for acute findings.        SLP Plan  Continue with current plan of care       Recommendations  Diet recommendations: Dysphagia 2 (fine chop);Thin liquid Liquids provided via: Cup;Straw Medication Administration: Whole meds with puree Supervision: Patient able to self feed;Full supervision/cueing for compensatory strategies;Staff to assist with self feeding Compensations: Slow rate;Small sips/bites Postural Changes and/or Swallow Maneuvers: Seated upright 90 degrees                Oral Care  Recommendations: Oral care BID Follow up Recommendations: Skilled Nursing facility SLP Visit Diagnosis: Dysphagia, unspecified (R13.10) Plan: Continue with current plan of care       GO                Houston Siren 01/07/2020, 11:56 AM  Orbie Pyo Colvin Caroli.Ed Risk analyst [phone] Office [phone] \

## 2020-01-07 NOTE — Telephone Encounter (Signed)
Attempted to call patient again. No answer.

## 2020-01-07 NOTE — Telephone Encounter (Signed)
TC to Pt in regard to missed appointments (LAB 8:15, LACIE 8:45) this morning. Unable to reach pt. Left voicemail for patient to call back [facility]

## 2020-01-07 NOTE — Progress Notes (Signed)
[facility] Progress Note Patient Name: [name] [name] DOB: May 04, 1946 MRN: [id number]   Date of Service  01/07/2020  HPI/Events of Note  CBG 60, was on D10.   eICU Interventions  Got D 50. Re started d 10 at 10 ml/hr for 6 hrs.      Intervention Category Intermediate Interventions: Other:  Elmer Sow 01/07/2020, 5:15 AM

## 2020-01-07 NOTE — Progress Notes (Addendum)
NAME:  [name] [name], MRN:  [id number], DOB:  1945-11-16, LOS: 9 ADMISSION DATE:  12/20/2019, CONSULTATION DATE:  01/03/20 REFERRING MD:  Bonner Puna - TRH, CHIEF COMPLAINT:  AMS, Found down. Reason for PCCM consult: Respiratory failure requiring BiPAP   Brief History   74 yo F PMH OSA, COPD on 2LNC, chronic diastolic heart failure, Afib, CKD III, SSS s/p PPM, hypothyroidism, obesity, who presented to ED 4/10 with AMS after being found down at home. EMS was dispatched to patient's home after neighbors noticed mail was full. Upon EMS arrival, pt was hypoxic SpO2 80% on RA and had pressure ulcers on R side of body, and endorsed global myalgia. Patient reported that she sustained fall approx 6 days prior to EMS dispatch and was unable to stand self.   In ED, pt hypoxic but VBG not overtly hypercarbic. CK 2000, Cr 2.48. Admitted to [facility].  4/15 patient with progressively worsening resp status, as well as complex medical picture including rhabdo/AKI s/p fluid restriction due to pulm status, elevated BNP despite fluid restriction and ongoing diuresis. PCCM consulted.   Past Medical History  COPD on 2LNC, OSA PFO, SSS s/p PPM, Paroxysmal Atrial Fibrillation, CAD, MI, HFpEF, HTN, HLD Hypothyroidism  Anemia Fatty liver Depression Breast Cancer s/p chemo   Significant Hospital Events   4/10 admitted to [facility] 4/11 PCCM placed IJ CVC 4/14 requiring increasing BiPAP support 4/15 PCCM consult  4/17 back to intermittent BiPAP 4/18 hypernatremia slowly improving  Consults:  PCCM   Procedures:  Central line 4/11>>4/19  Significant Diagnostic Tests:  CT H 4/11> R scalp hematoma. Multiple scattered small calcifications in subarachnoid space, stable. No acute intracranial abnormalities.  CXR 4/15 no PTX of pleural effusion. Clear lungs bilaterally. L sided pacemaker. L IJ catheter. Cardiomegaly, stable in appearance.   Micro Data:  4/10 COVID-19 neg 4/10 UC>>NG  Antimicrobials:  n/a  Interim  history/subjective:  Hypoglycemic overnight. No other events.  Objective   Blood pressure (!) 95/50, pulse 81, temperature 97.6 F (36.4 C), temperature source Axillary, resp. rate 16, height 5' 7"  (1.702 m), weight 136.1 kg, SpO2 99 %.    FiO2 (%):  [40 %] 40 %   Intake/Output Summary (Last 24 hours) at 01/07/2020 0530 Last data filed at 01/07/2020 0316 Gross per 24 hour  Intake 2292.7 ml  Output 1150 ml  Net 1142.7 ml   Filed Weights   12/26/2019 1046  Weight: 136.1 kg    Examination: General: in NAD HEENT: BiPAP on Neuro: awake. Follows commands CV: RRR +3 pitting edema of upper and lower extremity PULM:  Mechanical sounds with BiPAP.   GI: bs active Extremities: warm Skin: Multiple wounds with xeroform dressing in place. Chronic venous stasis changes in the lower extremities  Resolved Hospital Problem list   Rhabdomyolysis Transaminitis  Assessment & Plan:   Encephalopathy, suspect toxic metabolic. Likely acute on chronic. Hypercapnia vs hypernatremia vs nutritional deficiency vs dementia A: patient's daughter notes this is her baseline since March. Suspect that her hypernatremia and hypercapnia, and non-compliant OSA treatment also are contributing factors. P: B12/ folate Will continue to work on metabolic causes.  Delirium precautions.  Acute on chronic respiratory failure with hypercarbia and hypoxia. PFO likely contributing to hypoxia COPD on 2LNC at home OSA on CPAP P: Will change to CPAP prn and hs Brovana, pulmicort, prn albuterol nebs  AKI vs progression of CKD.  A: Baseline creatinine appears to be around 1-1.5.  Hypernatremia.  A: improving with free water replacement. Down to  2.6 free water deficit P: Encourage po free water for fluids.  Follow renal function / urine output Avoid nephrotoxins, ensure adequate renal perfusion   Chronic diastolic heart failure Moderate pulm artery hypertension -ECHO 4/11 with LVEF 55-60% with atrial  enlargement -elevated BNP on 4/15 to 1900 PFO A: remains hypervolemic appearing this morning.  P: 49m Lasix bid Strict intake and output , Daily weight   Atrial Fibrillation.  A: in sinus rhythm this morning. P: continue eliquis   Macrocytic anemia.  A: Stable. Suspect nutritionally related. P: continue b12 and folate supplementation  Hypothyroidism P: Continue home synthroid   Dysphagia P: Continue dysphagia 2 diet   R sided skin breakdown, present on admission A: wounds not appearing infected at this time. P: wound care, frequent turns.  Best practice:  Diet: dysphagia 2 Pain/Anxiety/Delirium protocol (if indicated): na VAP protocol (if indicated): na DVT prophylaxis: eliquis GI prophylaxis: pepcid Glucose control: monitor Mobility: PT/OT  Code Status: Full  Family Communication: daughter updated  Disposition: stable for transfer to hospitalist service today  RMitzi Hansen MD Internal Medicine Resident PGY1 Pager # [phone]/19/21  5:30 AM

## 2020-01-07 NOTE — Progress Notes (Signed)
Physical Therapy Treatment Patient Details Name: [name] [name] MRN: [id number] DOB: [birth year] Today's Date: 01/07/2020    History of Present Illness Past medical history of COPD on 2 LPM chronically, chronic respiratory failure, chronic CHF, OSA, A. fib, anemia, CKD 3, hypothyroidism, PPM implant.  Presented with a fall that occurred on Easter Sunday.  Patient lives alone and neighbors noted mailbox was full and packages were not picked up.  Significantly confused with multiple skin breakdowns and hypoxia on arrival.  Found to have acute kidney injury, hyperkalemia, hyponatremia, rhabdomyolysis and hypoxia.    PT Comments    Patient progressing this session able to stand x 2 with +2 lifting help and remained standing long enough to fix linen and wash her bottom.  She was unable to take steps, but did perform some weight shifts in preparation.  Flexed over walker throughout, reports limited ambulation prior to admission (though unreliable historian).  Feel she remains appropriate for SNF level rehab upon d/c.    Follow Up Recommendations  SNF;Supervision/Assistance - 24 hour     Equipment Recommendations  None recommended by PT;Other (comment)(TBA)    Recommendations for Other Services       Precautions / Restrictions Precautions Precautions: Fall Precaution Comments: multiple areas of skin breakdown Other Brace: B prevalon boots    Mobility  Bed Mobility Overal bed mobility: Needs Assistance Bed Mobility: Supine to Sit     Supine to sit: HOB elevated;Max assist;+2 for physical assistance     General bed mobility comments: assist for legs off bed and to scoot hips with pad under her  Transfers Overall transfer level: Needs assistance Equipment used: Rolling walker (2 wheeled) Transfers: Sit to/from Stand Sit to Stand: +2 physical assistance;From elevated surface;Max assist         General transfer comment: lifting help up to walker x 2 reps with cues and assist to get  weight on her feet with anterior weight shift, pt remained flexed throughout leaning on walker with UE's  Ambulation/Gait             General Gait Details: unable to take steps today   Stairs             Wheelchair Mobility    Modified Rankin (Stroke Patients Only)       Balance Overall balance assessment: Needs assistance Sitting-balance support: Feet supported Sitting balance-Leahy Scale: Fair Sitting balance - Comments: sitting EOB denied symptoms of dizziness or light headedness and had less reports of pain   Standing balance support: During functional activity;Bilateral upper extremity supported Standing balance-Leahy Scale: Poor Standing balance comment: min A of 2 for safety in standing with UE support pt flexed over walker, stood first time about 8 seconds, second time about 1.5 minutes                            Cognition Arousal/Alertness: Awake/alert Behavior During Therapy: Anxious Overall Cognitive Status: Impaired/Different from baseline Area of Impairment: Attention;Safety/judgement;Following commands                   Current Attention Level: Sustained Memory: Decreased short-term memory Following Commands: Follows one step commands with increased time;Follows one step commands consistently Safety/Judgement: Decreased awareness of safety;Decreased awareness of deficits   Problem Solving: Slow processing;Decreased initiation        Exercises General Exercises - Upper Extremity Shoulder Flexion: AAROM;5 reps;Supine Elbow Flexion: AAROM;5 reps;Supine General Exercises - Lower Extremity Ankle Circles/Pumps: AROM;10  reps;Seated    General Comments General comments (skin integrity, edema, etc.): assist to wash back and bottom in sitting/standing, noted multiple mepilex dressings on bottom (one sacral pad and two square pads)      Pertinent Vitals/Pain Pain Assessment: Faces Faces Pain Scale: Hurts whole lot Pain Location:  legs with movement Pain Descriptors / Indicators: Moaning;Grimacing Pain Intervention(s): Monitored during session;Repositioned;Premedicated before session    Home Living                      Prior Function            PT Goals (current goals can now be found in the care plan section) Progress towards PT goals: Progressing toward goals    Frequency    Min 2X/week      PT Plan Current plan remains appropriate    Co-evaluation              AM-PAC PT "6 Clicks" Mobility   Outcome Measure  Help needed turning from your back to your side while in a flat bed without using bedrails?: Total Help needed moving from lying on your back to sitting on the side of a flat bed without using bedrails?: Total Help needed moving to and from a bed to a chair (including a wheelchair)?: Total Help needed standing up from a chair using your arms (e.g., wheelchair or bedside chair)?: Total Help needed to walk in hospital room?: Total Help needed climbing 3-5 steps with a railing? : Total 6 Click Score: 6    End of Session Equipment Utilized During Treatment: Gait belt Activity Tolerance: Patient limited by fatigue;Patient limited by pain Patient left: in bed;with call bell/phone within reach;with nursing/sitter in room(seated EOB with nursing to finish bath)   PT Visit Diagnosis: Other abnormalities of gait and mobility (R26.89);Muscle weakness (generalized) (M62.81);History of falling (Z91.81)     Time: 6060-0459 PT Time Calculation (min) (ACUTE ONLY): 29 min  Charges:  $Therapeutic Exercise: 8-22 mins $Therapeutic Activity: 8-22 mins                     Magda Kiel, [place] [facility] [phone] 01/07/2020    Reginia Naas 01/07/2020, 1:03 PM

## 2020-01-07 NOTE — Progress Notes (Signed)
Pt was taken off BIPAP and  placed on 5 L [place] by RN. Pt is tolerating well at this time.

## 2020-01-08 ENCOUNTER — Other Ambulatory Visit: Payer: Self-pay

## 2020-01-08 DIAGNOSIS — Z66 Do not resuscitate: Secondary | ICD-10-CM

## 2020-01-08 DIAGNOSIS — D539 Nutritional anemia, unspecified: Secondary | ICD-10-CM

## 2020-01-08 DIAGNOSIS — I5033 Acute on chronic diastolic (congestive) heart failure: Secondary | ICD-10-CM | POA: Diagnosis not present

## 2020-01-08 DIAGNOSIS — Z789 Other specified health status: Secondary | ICD-10-CM

## 2020-01-08 DIAGNOSIS — Z7189 Other specified counseling: Secondary | ICD-10-CM

## 2020-01-08 DIAGNOSIS — E876 Hypokalemia: Secondary | ICD-10-CM

## 2020-01-08 DIAGNOSIS — Y92009 Unspecified place in unspecified non-institutional (private) residence as the place of occurrence of the external cause: Secondary | ICD-10-CM

## 2020-01-08 DIAGNOSIS — E440 Moderate protein-calorie malnutrition: Secondary | ICD-10-CM | POA: Diagnosis not present

## 2020-01-08 DIAGNOSIS — R5381 Other malaise: Secondary | ICD-10-CM

## 2020-01-08 DIAGNOSIS — J9621 Acute and chronic respiratory failure with hypoxia: Secondary | ICD-10-CM | POA: Diagnosis not present

## 2020-01-08 DIAGNOSIS — I480 Paroxysmal atrial fibrillation: Secondary | ICD-10-CM

## 2020-01-08 DIAGNOSIS — G9341 Metabolic encephalopathy: Secondary | ICD-10-CM

## 2020-01-08 DIAGNOSIS — N189 Chronic kidney disease, unspecified: Secondary | ICD-10-CM

## 2020-01-08 DIAGNOSIS — T796XXA Traumatic ischemia of muscle, initial encounter: Secondary | ICD-10-CM

## 2020-01-08 DIAGNOSIS — N179 Acute kidney failure, unspecified: Secondary | ICD-10-CM | POA: Diagnosis not present

## 2020-01-08 DIAGNOSIS — I495 Sick sinus syndrome: Secondary | ICD-10-CM

## 2020-01-08 LAB — BASIC METABOLIC PANEL
Anion gap: 8 (ref 5–15)
BUN: 85 mg/dL — ABNORMAL HIGH (ref 8–23)
CO2: 31 mmol/L (ref 22–32)
Calcium: 9.3 mg/dL (ref 8.9–10.3)
Chloride: 108 mmol/L (ref 98–111)
Creatinine, Ser: 2.15 mg/dL — ABNORMAL HIGH (ref 0.44–1.00)
GFR calc Af Amer: 26 mL/min — ABNORMAL LOW (ref >60)
GFR calc non Af Amer: 22 mL/min — ABNORMAL LOW (ref >60)
Glucose, Bld: 97 mg/dL (ref 70–99)
Potassium: 4.3 mmol/L (ref 3.5–5.1)
Sodium: 147 mmol/L — ABNORMAL HIGH (ref 135–145)

## 2020-01-08 LAB — URIC ACID: Uric Acid, Serum: 11.6 mg/dL — ABNORMAL HIGH (ref 2.5–7.1)

## 2020-01-08 LAB — CBC
HCT: 34.3 % — ABNORMAL LOW (ref 36.0–46.0)
Hemoglobin: 9.9 g/dL — ABNORMAL LOW (ref 12.0–15.0)
MCH: 30.3 pg (ref 26.0–34.0)
MCHC: 28.9 g/dL — ABNORMAL LOW (ref 30.0–36.0)
MCV: 104.9 fL — ABNORMAL HIGH (ref 80.0–100.0)
Platelets: 133 10*3/uL — ABNORMAL LOW (ref 150–400)
RBC: 3.27 MIL/uL — ABNORMAL LOW (ref 3.87–5.11)
RDW: 18.1 % — ABNORMAL HIGH (ref 11.5–15.5)
WBC: 5.3 10*3/uL (ref 4.0–10.5)
nRBC: 0 % (ref 0.0–0.2)

## 2020-01-08 LAB — GLUCOSE, CAPILLARY
Glucose-Capillary: 95 mg/dL (ref 70–99)
Glucose-Capillary: 67 mg/dL — ABNORMAL LOW (ref 70–99)
Glucose-Capillary: 95 mg/dL (ref 70–99)
Glucose-Capillary: 103 mg/dL — ABNORMAL HIGH (ref 70–99)
Glucose-Capillary: 108 mg/dL — ABNORMAL HIGH (ref 70–99)

## 2020-01-08 MED ORDER — DEXTROSE 5 % IV SOLN: INTRAVENOUS | Status: DC

## 2020-01-08 MED ORDER — SPIRONOLACTONE 25 MG PO TABS
25.0000 mg | ORAL_TABLET | Freq: Every day | ORAL | Status: DC
  Administered 2020-01-08 – 2020-01-11 (×4): 25 mg via ORAL
  Filled 2020-01-08 (×4): qty 1

## 2020-01-08 MED ORDER — FUROSEMIDE 10 MG/ML IJ SOLN
40.0000 mg | Freq: Two times a day (BID) | INTRAMUSCULAR | Status: DC
  Administered 2020-01-08 – 2020-01-09 (×2): 40 mg via INTRAVENOUS
  Filled 2020-01-08 (×2): qty 4

## 2020-01-08 MED ORDER — MIDODRINE HCL 5 MG PO TABS
5.0000 mg | ORAL_TABLET | Freq: Three times a day (TID) | ORAL | Status: DC
  Administered 2020-01-08 – 2020-01-12 (×13): 5 mg via ORAL
  Filled 2020-01-08 (×13): qty 1

## 2020-01-08 MED ORDER — DEXTROSE 10 % IV SOLN: INTRAVENOUS | Status: DC

## 2020-01-08 NOTE — TOC Progression Note (Signed)
Transition of Care [facility]) - Progression Note    Patient Details  Name: [name] [name] MRN: [id number] Date of Birth: 1946/06/05  Transition of Care [facility]) CM/SW [place], [place] Phone Number: 01/08/2020, 4:20 PM  Clinical Narrative:   CSW spoke with patient's daughter, [name], about next steps. [name] has filed for guardianship over her mother and needs a letter for court that patient does not have capacity, CSW asked and completed letter that MD signed about patient not having capacity to make medical decisions. Letter is on patient's chart for daughter to pick up this evening. CSW also provided bed offers to [place] for SNF, and [name] to research and determine choice. CSW to follow.    Expected Discharge Plan: [place] Barriers to Discharge: Continued Medical Work up, Unsafe home situation  Expected Discharge Plan and Services Expected Discharge Plan: [place] Choice: [place] arrangements for the past 2 months: Single Family Home                                       Social Determinants of Health (SDOH) Interventions    Readmission Risk Interventions Readmission Risk Prevention Plan 05/05/2019  Transportation Screening Complete  PCP or Specialist Appt within 3-5 Days Complete  HRI or [place] Complete  Social Work Consult for [place] Planning/Counseling Complete  Palliative Care Screening Not Applicable  Medication Review Press photographer) Complete  Some recent data might be hidden

## 2020-01-08 NOTE — Patient Outreach (Signed)
[place] [facility]) Care Management  01/08/2020  [name] [name] 01/16/1946 [id number]   Case Closure    Patient admitted to the hospital on 4/10/2 for AKI. She has been inpatient 10 or more days. Case will be closed at this time.    Plan: RN CM will close case at this time.   Enzo Montgomery, RN,BSN,CCM [place] Management Telephonic Care Management Coordinator Direct Phone: [phone] Toll Free: [phone] Fax: [phone]

## 2020-01-08 NOTE — Progress Notes (Addendum)
PROGRESS NOTE  [name] [name] EPP:[id number] DOB: 1946-05-01   PCP: Biagio Borg, MD  Patient is from: Home.  Oriented to self and place her daughter.  DOA: 12/26/2019 LOS: 10  Brief Narrative / Interim history: 74 yo F PMH OSA, COPD on 2LNC, diastolic CHF, Afib, CKD III, SSS s/p PPM, PFO, hypothyroidism, depression, breast cancer s/p chemo and morbid obesity who presented to ED 4/10 with AMS after being found down at home. EMS was dispatched to patient's home after neighbors noticed mail was full. Upon EMS arrival, pt was hypoxic SpO2 80% on RA and had pressure ulcers on R side of body, and endorsed global myalgia. Patient reported that she sustained fall approx 6 days prior to EMS dispatch and was unable to stand self.   In ED, pt hypoxic but VBG not overtly hypercarbic. CK 2000, Cr 2.48. Admitted to [facility].  On 4/15, patient with progressively worsening resp status, as well as complex medical picture including rhabdo/AKI s/p fluid restriction due to pulm status, elevated BNP despite fluid restriction and ongoing diuresis.  Transfer to ICU.  She was put on intermittent BiPAP.  Diuresed with IV Lasix with improvement in her breathing.  She was transferred back to [facility] on 4/20.  Subjective: Seen and examined earlier this morning.  No major events overnight of this morning.  No complaints.  She denies pain, shortness of breath, nausea, vomiting or abdominal pain but not a great historian.  She is oriented to self and place but not time.  Fair insight into his situation.  She recalls fall at home.  Objective: Vitals:   01/08/20 0700 01/08/20 0730 01/08/20 0800 01/08/20 0940  BP: (!) 99/58 (!) 99/58 100/62 (!) 105/53  Pulse: 81 85 80 83  Resp: 19 20 20 18   Temp: (!) 97.1 F (36.2 C)   97.6 F (36.4 C)  TempSrc: Axillary   Oral  SpO2: 97% 98% 98% 94%  Weight:    131 kg  Height:    5' 7"  (1.702 m)    Intake/Output Summary (Last 24 hours) at 01/08/2020 1158 Last data filed at 01/08/2020  0500 Gross per 24 hour  Intake 0 ml  Output 875 ml  Net -875 ml   Filed Weights   12/23/2019 1046 01/08/20 0940  Weight: 136.1 kg 131 kg    Examination:  GENERAL: No apparent distress. Nontoxic.  HEENT: MMM.  Vision and hearing grossly intact.  NECK: Supple.  No apparent JVD.  RESP: 100% on 3 L.  No IWOB.  Diminished aeration bilaterally. CVS:  RRR.  Seems to have his S3 or S4.  ABD/GI/GU: BS present. Soft. Non tender.  MSK/EXT:  Moves extremities. No apparent deformity.  Nonpitting edema in all extremities SKIN: no apparent skin lesion or wound NEURO: Awake, alert and oriented to self and place.  Follows commands.  No apparent focal neuro deficit. PSYCH: Calm.  Labile mood?  Procedures:  Right IJ central line 4/11>>  Microbiology summarized: 4/10 COVID-19 neg 4/10 UC>>NG  Assessment & Plan: Acute metabolic encephalopathy?  Multifactorial including respiratory failure although no significant hypercarbia.   TSH 6.63.  Free T4 normal.  B12 5.7K.  No obvious signs of infection.  No focal neuro deficit.  CT head without acute finding.  Ammonia 52.  She is oriented to self and place which seems to have baseline per daughter. -Recheck ammonia -Frequent orientation and delirium precautions.  Acute on chronic RF with hypoxia and hypercarbia: Multifactorial including OSA/possible OHS, COPD and PFO.  Not sure if she is compliant with CPAP at home.  On 2 L at baseline. -Minimum oxygen to maintain saturation above 88% -CPAP prn and hs -Continue Brovana, pulmicort, prn albuterol nebs  Acute on chronic diastolic heart failure: Echo on 4/11 with EF of 55 to 60%, no RWMA, indeterminate diastolic dysfunction, moderate LAE, severe RAE moderate TVR, moderate AVR and RVSP of 46.6.  BNP 2000 (higher than baseline).  Still with significant fluid overload clinically.  Had 875 cc UOP charted in the last 24 hours on IV Lasix 40 mg twice daily.  Renal function relatively stable.  Soft blood  pressures. -Continue IV Lasix 40 mg daily -Added Aldactone for hypokalemia and hyponatremia -Midodrine 5 mg 3 times daily for soft blood pressures -Strict intake and output -Sodium restriction -Repeat BNP -Will involve cardiology if no improvement  AKI  on CKD-3a/azotemia: Baseline Cr ~1.4> 2.48 (admit)> 2.61 (peak)>> 1.98> 2.15.  BUN 67 (admit)>> 85. Renal ultrasound without significant finding.  She could be a combination of prerenal insult in the setting of dehydration and possible pigment nephropathy from rhabdo.  Still with significant fluid overload on exam. -Continue monitoring -May involve nephrology if no improvement  Hypervolemic hypernatremia: Na 147 (admit)> 156 (peak)> 147 -Diuretics as above. -Continue free water per p.o.   Fall at home Debility/physical deconditioning Traumatic rhabdomyolysis-resolved. -PT/OT recommended SNF.  Hypoglycemia: Likely due to poor Po p.o. intake. -Increase D10 to 20 cc an hour.  Paroxysmal A. Fib: Now in sinus rhythm without medications.  CHA2DS2-VASc score greater than 3. -Continue Eliquis for anticoagulation?  Need to discuss risk and benefit given risk of fall.  SSS s/p PPM-stable.   Macrocytic anemia: B12 level high.  H&H stable. -Check anemia panel  Hypothyroidism: TSH 6.6. -Continue home Synthroid  Thrombocytopenia: Improving. -Continue monitoring  Dysphagia -Appreciate SLP input-dysphagia 2 diet.  Morbid obesity:Body mass index is 45.23 kg/m. -Challenging issue given patient's comorbidity and immobility.  History of gout: Uric acid elevated but patient denies pain.  Could be due to renal failure. -Recheck uric acid  Goal of care discussion: Patient with significant comorbidities and debility as above.  She is a still full code.  I do not believe patient has capacity to make reasonable medical decision.  I discussed with patient's daughter, [name] who states she is the only close family.  [name] is working to  [employer] and  guardianship paper.  After discussion about patient's comorbidity, quality of life, CODE STATUS and pros and cons of CPR and intubation, [name] requested for CODE STATUS to be changed to DNR/DNI.  -CODE STATUS changed to DNR/DNI -Palliative care consulted  Nutrition Nutrition Problem: Inadequate oral intake Etiology: poor appetite  Signs/Symptoms: meal completion < 50%  Interventions: Prostat, Hormel Shake  R sided skin breakdown, present on admission-no signs of infection. -wound care, frequent turns.   DVT prophylaxis: On Eliquis for A. fib Code Status: DNR/DNI Family Communication: Updated patient's daughter, [name] over the phone.  Discharge barrier: Acute on chronic diastolic CHF with significant fluid overload requiring IV Lasix, hypoglycemia requiring D10 infusion, hyponatremia and AKI. Patient is from: Home Final disposition: SNF when medically stable and SNF bed available, likely in the next 3 to 4 days  Consultants:  PCCM Palliative care   Sch Meds:  Scheduled Meds: . apixaban  5 mg Oral BID  . arformoterol  15 mcg Nebulization BID  . budesonide (PULMICORT) nebulizer solution  0.25 mg Nebulization BID  . Chlorhexidine Gluconate Cloth  6 each Topical Daily  .  famotidine  20 mg Oral Daily  . feeding supplement (ENSURE ENLIVE)  237 mL Oral TID BM  . folic acid  1 mg Oral Daily  . free water  200 mL Oral Q4H  . levothyroxine  200 mcg Oral Q0600  . sodium chloride flush  10-40 mL Intracatheter Q12H  . spironolactone  25 mg Oral Daily  . thiamine  100 mg Oral Daily  . vitamin B-12  1,000 mcg Oral Daily   Continuous Infusions: . dextrose     PRN Meds:.acetaminophen **OR** acetaminophen, albuterol, dextrose, ondansetron (ZOFRAN) IV, oxyCODONE  Antimicrobials: Anti-infectives (From admission, onward)   Start     Dose/Rate Route Frequency Ordered Stop   01/02/20 0600  vancomycin (VANCOREADY) IVPB 1250 mg/250 mL  Status:  Discontinued     1,250 mg 166.7 mL/hr over  90 Minutes Intravenous Every 48 hours 12/31/19 0801 01/01/20 0757   12/31/19 1700  cefTRIAXone (ROCEPHIN) 2 g in sodium chloride 0.9 % 100 mL IVPB  Status:  Discontinued     2 g 200 mL/hr over 30 Minutes Intravenous Every 24 hours 12/31/19 1250 01/01/20 0757   12/31/19 1000  aztreonam (AZACTAM) 1 g in sodium chloride 0.9 % 100 mL IVPB  Status:  Discontinued     1 g 200 mL/hr over 30 Minutes Intravenous Every 8 hours 12/31/19 0801 12/31/19 1250   12/31/19 0900  vancomycin (VANCOREADY) IVPB 2000 mg/400 mL     2,000 mg 200 mL/hr over 120 Minutes Intravenous  Once 12/31/19 0801 12/31/19 1221   12/31/19 0800  metroNIDAZOLE (FLAGYL) IVPB 500 mg  Status:  Discontinued     500 mg 100 mL/hr over 60 Minutes Intravenous Every 8 hours 12/31/19 0753 01/01/20 0757       I have personally reviewed the following labs and images: CBC: Recent Labs  Lab 01/03/20 0613 01/03/20 0613 01/03/20 1813 01/05/20 0519 01/06/20 0434 01/07/20 0301 01/08/20 0423  WBC 5.8  --   --  4.9 4.3 4.9 5.3  HGB 10.7*   < > 11.9* 10.8* 10.0* 10.1* 9.9*  HCT 37.4   < > 35.0* 39.0 35.0* 35.3* 34.3*  MCV 104.8*  --   --  105.7* 104.8* 104.7* 104.9*  PLT 89*  --   --  94* 115* 122* 133*   < > = values in this interval not displayed.   BMP &GFR Recent Labs  Lab 01/02/20 0449 01/03/20 0613 01/05/20 1645 01/06/20 0434 01/06/20 1200 01/07/20 0301 01/08/20 0423  NA 156*   < > 150* 149* 148* 146* 147*  K 3.4*   < > 3.5 4.1 3.9 4.1 4.3  CL 118*   < > 113* 112* 111 108 108  CO2 27   < > 31 31 30 29 31   GLUCOSE 99   < > 109* 95 108* 97 97  BUN 73*   < > 79* 78* 76* 78* 85*  CREATININE 2.22*   < > 1.99* 2.02* 1.98* 2.08* 2.15*  CALCIUM 8.8*   < > 9.1 9.1 9.1 9.4 9.3  MG 2.2  --   --   --   --   --   --    < > = values in this interval not displayed.   Estimated Creatinine Clearance: 32.9 mL/min (A) (by C-G formula based on SCr of 2.15 mg/dL (H)). Liver & Pancreas: Recent Labs  Lab 01/03/20 0613 01/04/20 0540  01/05/20 0519 01/06/20 0434 01/07/20 0301  AST 28 20 24 29 28   ALT 33 30 29  25 26  ALKPHOS 129* 144* 163* 177* 173*  BILITOT 1.8* 1.9* 1.4* 1.1 0.9  PROT 6.1* 6.1* 6.0* 5.9* 5.9*  ALBUMIN 3.1* 2.8* 2.8* 2.6* 2.6*   No results for input(s): LIPASE, AMYLASE in the last 168 hours. No results for input(s): AMMONIA in the last 168 hours. Diabetic: No results for input(s): HGBA1C in the last 72 hours. Recent Labs  Lab 01/07/20 1908 01/07/20 2309 01/08/20 0310 01/08/20 0720 01/08/20 1142  GLUCAP 103* 100* 95 67* 95   Cardiac Enzymes: Recent Labs  Lab 01/02/20 0449 01/04/20 0540  CKTOTAL 167 49   No results for input(s): PROBNP in the last 8760 hours. Coagulation Profile: Recent Labs  Lab 01/02/20 0449  INR 1.5*   Thyroid Function Tests: No results for input(s): TSH, T4TOTAL, FREET4, T3FREE, THYROIDAB in the last 72 hours. Lipid Profile: No results for input(s): CHOL, HDL, LDLCALC, TRIG, CHOLHDL, LDLDIRECT in the last 72 hours. Anemia Panel: No results for input(s): VITAMINB12, FOLATE, FERRITIN, TIBC, IRON, RETICCTPCT in the last 72 hours. Urine analysis:    Component Value Date/Time   COLORURINE AMBER (A) 12/27/2019 1309   APPEARANCEUR CLOUDY (A) 01/08/2020 1309   LABSPEC 1.012 12/21/2019 1309   PHURINE 5.0 01/13/2020 1309   GLUCOSEU NEGATIVE 12/20/2019 1309   GLUCOSEU NEGATIVE 11/26/2019 0825   HGBUR LARGE (A) 12/25/2019 1309   BILIRUBINUR NEGATIVE 01/16/2020 1309   KETONESUR NEGATIVE 01/15/2020 1309   PROTEINUR 100 (A) 01/02/2020 1309   UROBILINOGEN 0.2 11/26/2019 0825   NITRITE NEGATIVE 01/12/2020 1309   LEUKOCYTESUR NEGATIVE 01/02/2020 1309   Sepsis Labs: Invalid input(s): PROCALCITONIN, [place]  Microbiology: Recent Results (from the past 240 hour(s))  SARS CORONAVIRUS 2 (TAT 6-24 HRS) Nasopharyngeal Urine, Clean Catch     Status: None   Collection Time: 12/20/2019 12:08 PM   Specimen: Urine, Clean Catch; Nasopharyngeal  Result Value Ref Range  Status   SARS Coronavirus 2 NEGATIVE NEGATIVE Final    Comment: (NOTE) SARS-CoV-2 target nucleic acids are NOT DETECTED. The SARS-CoV-2 RNA is generally detectable in upper and lower respiratory specimens during the acute phase of infection. Negative results do not preclude SARS-CoV-2 infection, do not rule out co-infections with other pathogens, and should not be used as the sole basis for treatment or other patient management decisions. Negative results must be combined with clinical observations, patient history, and epidemiological information. The expected result is Negative. Fact Sheet for Patients: SugarRoll.be Fact Sheet for Healthcare Providers: https://www.woods-mathews.com/ This test is not yet approved or cleared by the [place] FDA and  has been authorized for detection and/or diagnosis of SARS-CoV-2 by FDA under an Emergency Use Authorization (EUA). This EUA will remain  in effect (meaning this test can be used) for the duration of the COVID-19 declaration under Section 56 4(b)(1) of the Act, 21 U.S.C. section 360bbb-3(b)(1), unless the authorization is terminated or revoked sooner. Performed at [facility], [place] [address] [place], [place] [address]   Urine culture     Status: None   Collection Time: 12/23/2019  1:09 PM   Specimen: Urine, Random  Result Value Ref Range Status   Specimen Description URINE, RANDOM  Final   Special Requests NONE  Final   Culture   Final    NO GROWTH Performed at [facility], [place] [address] [place], [place] [address]    Report Status 12/30/2019 FINAL  Final  MRSA PCR Screening     Status: Abnormal   Collection Time: 01/03/20  5:46 PM  Specimen: Nasal Mucosa; Nasopharyngeal  Result Value Ref Range Status   MRSA by PCR POSITIVE (A) NEGATIVE Final    Comment:        The GeneXpert MRSA Assay (FDA approved for NASAL specimens only), is one component of a comprehensive MRSA  colonization surveillance program. It is not intended to diagnose MRSA infection nor to guide or monitor treatment for MRSA infections. RESULT CALLED TO, READ BACK BY AND VERIFIED WITH: Garfield Cornea RN 01/03/20 2013 JDW Performed at [facility], [place] [address] [place], [place] [address]     Radiology Studies: No results found.  60 minutes with more than 50% spent in reviewing records, counseling patient/family and coordinating care.   Tayloranne Lekas T. Dungannon  If 7PM-7AM, please contact night-coverage [email or web address] Password [facility] 01/08/2020, 11:58 AM

## 2020-01-08 NOTE — Care Management Important Message (Signed)
Important Message  Patient Details  Name: [name] [name] MRN: [id number] Date of Birth: May 04, 1946   Medicare Important Message Given:  Yes     Shelda Altes 01/08/2020, 10:35 AM

## 2020-01-09 ENCOUNTER — Inpatient Hospital Stay (HOSPITAL_COMMUNITY): Payer: Medicare HMO

## 2020-01-09 DIAGNOSIS — N179 Acute kidney failure, unspecified: Secondary | ICD-10-CM | POA: Diagnosis not present

## 2020-01-09 LAB — CBC
HCT: 33.3 % — ABNORMAL LOW (ref 36.0–46.0)
Hemoglobin: 9.4 g/dL — ABNORMAL LOW (ref 12.0–15.0)
MCH: 29.7 pg (ref 26.0–34.0)
MCHC: 28.2 g/dL — ABNORMAL LOW (ref 30.0–36.0)
MCV: 105 fL — ABNORMAL HIGH (ref 80.0–100.0)
Platelets: 153 10*3/uL (ref 150–400)
RBC: 3.17 MIL/uL — ABNORMAL LOW (ref 3.87–5.11)
RDW: 18.2 % — ABNORMAL HIGH (ref 11.5–15.5)
WBC: 5.4 10*3/uL (ref 4.0–10.5)
nRBC: 0 % (ref 0.0–0.2)

## 2020-01-09 LAB — BRAIN NATRIURETIC PEPTIDE: B Natriuretic Peptide: 1649.1 pg/mL — ABNORMAL HIGH (ref 0.0–100.0)

## 2020-01-09 LAB — RENAL FUNCTION PANEL
Albumin: 2.6 g/dL — ABNORMAL LOW (ref 3.5–5.0)
Anion gap: 10 (ref 5–15)
BUN: 95 mg/dL — ABNORMAL HIGH (ref 8–23)
CO2: 29 mmol/L (ref 22–32)
Calcium: 9.5 mg/dL (ref 8.9–10.3)
Chloride: 107 mmol/L (ref 98–111)
Creatinine, Ser: 2.17 mg/dL — ABNORMAL HIGH (ref 0.44–1.00)
GFR calc Af Amer: 25 mL/min — ABNORMAL LOW (ref >60)
GFR calc non Af Amer: 22 mL/min — ABNORMAL LOW (ref >60)
Glucose, Bld: 125 mg/dL — ABNORMAL HIGH (ref 70–99)
Phosphorus: 4.1 mg/dL (ref 2.5–4.6)
Potassium: 4.3 mmol/L (ref 3.5–5.1)
Sodium: 146 mmol/L — ABNORMAL HIGH (ref 135–145)

## 2020-01-09 LAB — HEPATIC FUNCTION PANEL
ALT: 30 U/L (ref 0–44)
AST: 32 U/L (ref 15–41)
Albumin: 2.6 g/dL — ABNORMAL LOW (ref 3.5–5.0)
Alkaline Phosphatase: 208 U/L — ABNORMAL HIGH (ref 38–126)
Bilirubin, Direct: 0.2 mg/dL (ref 0.0–0.2)
Indirect Bilirubin: 0.5 mg/dL (ref 0.3–0.9)
Total Bilirubin: 0.7 mg/dL (ref 0.3–1.2)
Total Protein: 6 g/dL — ABNORMAL LOW (ref 6.5–8.1)

## 2020-01-09 LAB — GLUCOSE, CAPILLARY
Glucose-Capillary: 100 mg/dL — ABNORMAL HIGH (ref 70–99)
Glucose-Capillary: 129 mg/dL — ABNORMAL HIGH (ref 70–99)
Glucose-Capillary: 40 mg/dL — CL (ref 70–99)
Glucose-Capillary: 86 mg/dL (ref 70–99)
Glucose-Capillary: 89 mg/dL (ref 70–99)
Glucose-Capillary: 93 mg/dL (ref 70–99)
Glucose-Capillary: 98 mg/dL (ref 70–99)

## 2020-01-09 LAB — RESPIRATORY PANEL BY RT PCR (FLU A&B, COVID)
Influenza A by PCR: NEGATIVE
Influenza B by PCR: NEGATIVE
SARS Coronavirus 2 by RT PCR: NEGATIVE

## 2020-01-09 LAB — AMMONIA: Ammonia: 77 umol/L — ABNORMAL HIGH (ref 9–35)

## 2020-01-09 LAB — MAGNESIUM: Magnesium: 2.3 mg/dL (ref 1.7–2.4)

## 2020-01-09 MED ORDER — FUROSEMIDE 10 MG/ML IJ SOLN
60.0000 mg | Freq: Two times a day (BID) | INTRAMUSCULAR | Status: DC
  Administered 2020-01-09 – 2020-01-10 (×3): 60 mg via INTRAVENOUS
  Filled 2020-01-09 (×3): qty 6

## 2020-01-09 MED ORDER — ALLOPURINOL 100 MG PO TABS
50.0000 mg | ORAL_TABLET | Freq: Every day | ORAL | Status: DC
  Administered 2020-01-09 – 2020-01-16 (×6): 50 mg via ORAL
  Filled 2020-01-09 (×6): qty 1

## 2020-01-09 MED ORDER — LACTULOSE 10 GM/15ML PO SOLN
20.0000 g | Freq: Two times a day (BID) | ORAL | Status: DC
  Administered 2020-01-09 – 2020-01-10 (×3): 20 g via ORAL
  Filled 2020-01-09 (×3): qty 30

## 2020-01-09 NOTE — Progress Notes (Signed)
  Speech Language Pathology Treatment: Dysphagia  Patient Details Name: [name] [name] MRN: [id number] DOB: [birth year] Today's Date: 01/09/2020 Time: 4098-1191 SLP Time Calculation (min) (ACUTE ONLY): 8 min  Assessment / Plan / Recommendation Clinical Impression  Pt sleeping when SLP arrived, pt repositioned and required frequent verbal and tactile cues to awaken for several sips of Ensure. Goal to upgrade texture unable to address today given her lethargy. Continued stimuli provided however pt unable to maintain alertness and session ended. Will continue attempts while pt in hospital.   HPI HPI: [name] [name] is a 74 y.o. female with medical history significant of COPD chronically on 2 L oxygen, chronic diastolic CHF, sleep apnea, paroxysmal A. fib, chronic anemia, CKD stage III with baseline creatinine of 1.3, sick sinus syndrome status post PPM, hypothyroidism presented with Fall.  Patient lives by herself, and she is morbidly obese and had a fall episode about 6 days ago.  Patient was not able to stand up herself and she stayed on lying for the last 6 days.  Head CT was negative for acute findings.        SLP Plan  Continue with current plan of care       Recommendations  Diet recommendations: Dysphagia 3 (mechanical soft);Thin liquid Liquids provided via: Cup;Straw Medication Administration: Whole meds with puree Supervision: Patient able to self feed;Full supervision/cueing for compensatory strategies;Staff to assist with self feeding Compensations: Slow rate;Small sips/bites Postural Changes and/or Swallow Maneuvers: Seated upright 90 degrees                Oral Care Recommendations: Oral care BID Follow up Recommendations: Skilled Nursing facility SLP Visit Diagnosis: Dysphagia, unspecified (R13.10) Plan: Continue with current plan of care       GO                [name] [name] 01/09/2020, 6:26 AM

## 2020-01-09 NOTE — Progress Notes (Signed)
Nutrition Follow-up  DOCUMENTATION CODES:   Morbid obesity  INTERVENTION:   -Continue MVI with minerals daily -Continue Hormel Shake TID, each supplement provides 520 kcals and 22 grams protein -Continue Magic cup TID with meals, each supplement provides 290 kcal and 9 grams of protein -Continue Ensure Enlive po TID, each supplement provides 350 kcal and 20 grams of protein -Feeding assistance with meals  NUTRITION DIAGNOSIS:   Inadequate oral intake related to poor appetite as evidenced by meal completion < 50%.  Ongoing  GOAL:   Patient will meet greater than or equal to 90% of their needs  Progressing   MONITOR:   PO intake, Supplement acceptance, Weight trends, Skin  REASON FOR ASSESSMENT:   Consult Wound healing  ASSESSMENT:   Pt with a PMH significant for COPD on 2L oxygen chronically, chronic respiratory failure, CHF, OSA, A. Fib, anemia, CKD 3, hypothyroidism, PPM implant. Pt presented s/p fall and was admitted with rhabdomyolysis, AKI on CKD stage 3, acute metabolic encephalopathy, hyperkalemia and hypernatremia.  4/13- advanced to dysphagia 1 diet with thin liquids 4/14- advanced to dysphagia 2 diet with thin liquids  Reviewed I/O's: -90 ml x 24 hours and =3.5 L since admission  UOP: 700 ml x 24 hours  Pt sitting up in bed at time of visit, no interactive with this RD> Noted lunch tray was untouched. Pt consumed about 25% of Ensure supplement on tray table.   Meal completion remains 25-50%.   Medications reviewed and include vitamin L-95, folic acid, and thiamine.   Per CSW notes, plan to d/c to SNF once insurance authorization is complete.   Labs reviewed: CBGS: 86-98.   Diet Order:   Diet Order            DIET DYS 2 Room service appropriate? Yes; Fluid consistency: Thin  Diet effective now              EDUCATION NEEDS:   No education needs have been identified at this time  Skin:  Skin Assessment: Skin Integrity Issues: Skin Integrity  Issues:: Other (Comment) Other: non pressure wound on rt foot; MASD to coccyx, groin, and perineum  Last BM:  01/09/20  Height:   Ht Readings from Last 1 Encounters:  01/08/20 5' 7"  (1.702 m)    Weight:   Wt Readings from Last 1 Encounters:  01/09/20 132.2 kg   BMI:  Body mass index is 45.65 kg/m.  Estimated Nutritional Needs:   Kcal:  2100-2300  Protein:  120-135 grams  Fluid:  > 2.2 L    Loistine Chance, RD, LDN, [place] Registered Dietitian II Certified Diabetes Care and Education Specialist Please refer to [facility] for RD and/or RD on-call/weekend/after hours pager

## 2020-01-09 NOTE — Progress Notes (Addendum)
PROGRESS NOTE    [name] [name]  OIB:[id number] DOB: 1946-05-16 DOA: 01/02/2020 PCP: Biagio Borg, MD   Chief Complaint  Patient presents with  . Fall    Brief Narrative:  74 yo F PMH OSA, COPD on 2LNC, diastolic CHF, Afib, CKD III, SSS s/p PPM, PFO, hypothyroidism, depression, breast cancer s/p chemo and morbid obesity who presented to ED 4/10 with AMS after being found down at home. EMS was dispatched to patient's home after neighbors noticed mail was full. Upon EMS arrival, pt was hypoxic SpO2 80% on RA and had pressure ulcers on R side of body, and endorsed global myalgia. Patient reported that she sustained fall approx 6 days prior to EMS dispatch and was unable to stand self.   In ED, pt hypoxic but VBG not overtly hypercarbic. CK 2000, Cr 2.48. Admitted to [facility].  On 4/15, patient with progressively worsening resp status, as well as complex medical picture including rhabdo/AKI s/p fluid restriction due to pulm status, elevated BNP despite fluid restriction and ongoing diuresis.  Transfer to ICU.  She was put on intermittent BiPAP.  Diuresed with IV Lasix with improvement in her breathing.  She was transferred back to [facility] on 4/20.  Assessment & Plan:   Active Problems:   LFT elevation   AMS (altered mental status)   Non-traumatic rhabdomyolysis   Acute on chronic respiratory failure with hypoxia and hypercapnia (HCC)   Non-pressure chronic ulcer of other part of right foot limited to breakdown of skin ([place])   Fall   Moderate protein-calorie malnutrition (HCC)   Idiopathic chronic gout without tophus  Acute metabolic encephalopathy:  Multifactorial including respiratory failure although no significant hypercarbia.   TSH 6.63.  Free T4 normal.  B12 5.7K.  No obvious signs of infection.  No focal neuro deficit.  CT head without acute finding.  Ammonia 52 -> 77.  She is oriented to self and place which seems to have baseline per daughter. -ammonia elevated, with start lactulose -AM  VBG -Frequent orientation and delirium precautions.  Acute on chronic RF with hypoxia and hypercarbia: Multifactorial including OSA/possible OHS, COPD and PFO.  Not sure if she is compliant with CPAP at home.  On 2 L at baseline. -currently on 3 L [place] -Minimum oxygen to maintain saturation above 88% -CPAP prn and hs -Continue Brovana, pulmicort, prn albuterol nebs -CXR with mild central pulm vascular congestion  Acute on chronic diastolic heart failure: Echo on 4/11 with EF of 55 to 60%, no RWMA, indeterminate diastolic dysfunction, moderate LAE, severe RAE moderate TVR, moderate AVR and RVSP of 46.6.  BNP 2000 (higher than baseline).  Still with significant fluid overload clinically.  UOP 1200 cc 4/20. -increase IV Lasix 60 mg twice daily -Added Aldactone for hypokalemia and hyponatremia -Midodrine 5 mg 3 times daily for soft blood pressures -Strict intake and output -Sodium restriction -Repeat BNP elevated -Will involve cardiology vs renal if no improvement  AKI on CKD-3a/azotemia: Baseline Cr ~1.4> 2.48 (admit)> 2.61 (peak)>> 1.98> 2.15 > 2.17 today.  BUN 67 (admit)>> 85. Renal ultrasound without significant finding.  She could be Karstyn Birkey combination of prerenal insult in the setting of dehydration and possible pigment nephropathy from rhabdo.  Still with significant fluid overload on exam. -Continue monitoring -continue diuresis with IV lasix -May involve nephrology if no improvement  Hypervolemic hypernatremia: Na 147 (admit)> 156 (peak)> 146 -Diuretics as above. -Continue free water per p.o.   Fall at home Debility/physical deconditioning Traumatic rhabdomyolysis-resolved. -PT/OT recommended SNF.  Hypoglycemia: Likely due to poor Po p.o. intake. - d/c D10 and follow POC BG's  Paroxysmal Baudelia Schroepfer. Fib: Now in sinus rhythm without medications.  CHA2DS2-VASc score greater than 3. -Continue Eliquis for anticoagulation?  Need to discuss risk and benefit given risk of fall.  SSS s/p  PPM-stable.  Macrocytic anemia: B12 level high, folate wnl.  H&H stable. -iron, ferritin, retic  Hypothyroidism: TSH 6.6. -Continue home Synthroid  Thrombocytopenia: Improving. -Continue monitoring  Dysphagia -Appreciate SLP input-dysphagia 2 diet.  Morbid obesity:Body mass index is 45.23 kg/m. -Challenging issue given patient's comorbidity and immobility.  History of gout: Uric acid elevated but patient denies pain.  Could be due to renal failure. -Recheck uric acid -> downtrending, follow -restart allopurinol, low dose with aki  Goal of care discussion: Patient with significant comorbidities and debility as above.  She is [name] [name] still full code.  Per previous provider "I do not believe patient has capacity to make reasonable medical decision.  I discussed with patient's daughter, [name] who states she is the only close family.  [name] is working to  [employer] and guardianship paper.  After discussion about patient's comorbidity, quality of life, CODE STATUS and pros and cons of CPR and intubation, [name] requested for CODE STATUS to be changed to DNR/DNI."  -CODE STATUS changed to DNR/DNI -Palliative care consulted  Nutrition Nutrition Problem: Inadequate oral intake Etiology: poor appetite  Signs/Symptoms: meal completion < 50%  Interventions: Prostat, Hormel Shake  R sided skin breakdown, present on admission-no signs of infection. -wound care, frequent turns.  RIJ central line Discontinue central line, establish PIV  Foley in place:  Continue for now for strict I/O  DVT prophylaxis: eliquis Code Status: DNR Family Communication: none at bedside, daughter over phone Disposition:   Status is: Inpatient  Remains inpatient appropriate because:Altered mental status, IV treatments appropriate due to intensity of illness or inability to take PO, Inpatient level of care appropriate due to severity of illness and continued need for IV diuresis, monitoring of renal function  in setting of volume overload   Dispo: The patient is from: Home              Anticipated d/c is to: SNF              Anticipated d/c date is: 3 days              Patient currently is not medically stable to d/c.  Consultants:   none  Procedures:  Echo IMPRESSIONS    1. Limted bubble study Bi atrial enlargement with pacing wire in RA/RV.  Mobile redundant atrial septum with positive bubble study for right to  left shunt fairly large amount of bubbles crossing septum.   Echo IMPRESSIONS    1. Left ventricular ejection fraction, by estimation, is 55 to 60%. The  left ventricle has normal function. The left ventricle has no regional  wall motion abnormalities. Left ventricular diastolic parameters are  indeterminate.  2. Right ventricular systolic function is normal. The right ventricular  size is normal. There is moderately elevated pulmonary artery systolic  pressure.  3. Left atrial size was moderately dilated.  4. Right atrial size was severely dilated.  5. The mitral valve is grossly normal. Mild to moderate mitral valve  regurgitation.  6. Tricuspid valve regurgitation is moderate.  7. The aortic valve is tricuspid. Aortic valve regurgitation is moderate.  Mild aortic valve stenosis.   LE [place] Summary:  RIGHT:  - Findings appear essentially unchanged compared to  previous examination.  - There is no evidence of deep vein thrombosis in the lower extremity.  However, portions of this examination were limited- see technologist  comments above.    LEFT:  - There is no evidence of deep vein thrombosis in the lower extremity.  However, portions of this examination were limited- see technologist  comments above.   UE [place] Summary:    Right:  No evidence of deep vein thrombosis in the upper extremity. However,  unable to  visualize the axillary, cephalic, and basilic veins.    Left:  No evidence of deep vein thrombosis in the upper extremity. However,   unable to  visualize the cephalic, basilic, and internal jugular veins.    Antimicrobials:  Anti-infectives (From admission, onward)   Start     Dose/Rate Route Frequency Ordered Stop   01/02/20 0600  vancomycin (VANCOREADY) IVPB 1250 mg/250 mL  Status:  Discontinued     1,250 mg 166.7 mL/hr over 90 Minutes Intravenous Every 48 hours 12/31/19 0801 01/01/20 0757   12/31/19 1700  cefTRIAXone (ROCEPHIN) 2 g in sodium chloride 0.9 % 100 mL IVPB  Status:  Discontinued     2 g 200 mL/hr over 30 Minutes Intravenous Every 24 hours 12/31/19 1250 01/01/20 0757   12/31/19 1000  aztreonam (AZACTAM) 1 g in sodium chloride 0.9 % 100 mL IVPB  Status:  Discontinued     1 g 200 mL/hr over 30 Minutes Intravenous Every 8 hours 12/31/19 0801 12/31/19 1250   12/31/19 0900  vancomycin (VANCOREADY) IVPB 2000 mg/400 mL     2,000 mg 200 mL/hr over 120 Minutes Intravenous  Once 12/31/19 0801 12/31/19 1221   12/31/19 0800  metroNIDAZOLE (FLAGYL) IVPB 500 mg  Status:  Discontinued     500 mg 100 mL/hr over 60 Minutes Intravenous Every 8 hours 12/31/19 0753 01/01/20 0757      Subjective: [name] [name]&Ox2, denies confusion (but seems confused on discussion) Wants to go home No c/o chest pain or SOB  Objective: Vitals:   01/09/20 0752 01/09/20 0900 01/09/20 1150 01/09/20 1557  BP:  101/71    Pulse:  88    Resp:  (!) 22    Temp: 97.8 F (36.6 C)  98.1 F (36.7 C) 98.1 F (36.7 C)  TempSrc: Oral  Oral Oral  SpO2:  100%    Weight:      Height:        Intake/Output Summary (Last 24 hours) at 01/09/2020 1623 Last data filed at 01/09/2020 1523 Gross per 24 hour  Intake 1401.72 ml  Output 1100 ml  Net 301.72 ml   Filed Weights   01/13/2020 1046 01/08/20 0940 01/09/20 0425  Weight: 136.1 kg 131 kg 132.2 kg    Examination:  General exam: Appears calm and comfortable  Respiratory system: distant breath sounds Cardiovascular system: S1 & S2 heard, RRR.  Gastrointestinal system: Abdomen is nondistended, soft  and nontender.  Central nervous system: Alert and oriented x2. No focal neurological deficits. Extremities: bilateral LE edema Skin: No rashes, lesions or ulcers Psychiatry: Judgement and insight appear normal. Mood & affect appropriate.     Data Reviewed: I have personally reviewed following labs and imaging studies  CBC: Recent Labs  Lab 01/05/20 0519 01/06/20 0434 01/07/20 0301 01/08/20 0423 01/09/20 0438  WBC 4.9 4.3 4.9 5.3 5.4  HGB 10.8* 10.0* 10.1* 9.9* 9.4*  HCT 39.0 35.0* 35.3* 34.3* 33.3*  MCV 105.7* 104.8* 104.7* 104.9* 105.0*  PLT 94* 115* 122* 133* 153  Basic Metabolic Panel: Recent Labs  Lab 01/06/20 0434 01/06/20 1200 01/07/20 0301 01/08/20 0423 01/09/20 0438  NA 149* 148* 146* 147* 146*  K 4.1 3.9 4.1 4.3 4.3  CL 112* 111 108 108 107  CO2 31 30 29 31 29   GLUCOSE 95 108* 97 97 125*  BUN 78* 76* 78* 85* 95*  CREATININE 2.02* 1.98* 2.08* 2.15* 2.17*  CALCIUM 9.1 9.1 9.4 9.3 9.5  MG  --   --   --   --  2.3  PHOS  --   --   --   --  4.1    GFR: Estimated Creatinine Clearance: 32.7 mL/min ([name] [name]) (by C-G formula based on SCr of 2.17 mg/dL (H)).  Liver Function Tests: Recent Labs  Lab 01/04/20 0540 01/04/20 0540 01/05/20 0519 01/06/20 0434 01/07/20 0301 01/09/20 0438 01/09/20 0726  AST 20  --  24 29 28   --  32  ALT 30  --  29 25 26   --  30  ALKPHOS 144*  --  163* 177* 173*  --  208*  BILITOT 1.9*  --  1.4* 1.1 0.9  --  0.7  PROT 6.1*  --  6.0* 5.9* 5.9*  --  6.0*  ALBUMIN 2.8*   < > 2.8* 2.6* 2.6* 2.6* 2.6*   < > = values in this interval not displayed.    CBG: Recent Labs  Lab 01/09/20 0016 01/09/20 0407 01/09/20 0754 01/09/20 1152 01/09/20 1559  GLUCAP 129* 100* 86 98 93     Recent Results (from the past 240 hour(s))  MRSA PCR Screening     Status: Abnormal   Collection Time: 01/03/20  5:46 PM   Specimen: Nasal Mucosa; Nasopharyngeal  Result Value Ref Range Status   MRSA by PCR POSITIVE ([name] [name]) NEGATIVE Final    Comment:         The GeneXpert MRSA Assay (FDA approved for NASAL specimens only), is one component of Kae Lauman comprehensive MRSA colonization surveillance program. It is not intended to diagnose MRSA infection nor to guide or monitor treatment for MRSA infections. RESULT CALLED TO, READ BACK BY AND VERIFIED WITH: Garfield Cornea RN 01/03/20 2013 JDW Performed at [facility], [place] [address] [place], [place] [address]   Respiratory Panel by RT PCR (Flu [name] [name]&B, Covid) - Nasopharyngeal Swab     Status: None   Collection Time: 01/09/20  2:41 PM   Specimen: Nasopharyngeal Swab  Result Value Ref Range Status   SARS Coronavirus 2 by RT PCR NEGATIVE NEGATIVE Final    Comment: (NOTE) SARS-CoV-2 target nucleic acids are NOT DETECTED. The SARS-CoV-2 RNA is generally detectable in upper respiratoy specimens during the acute phase of infection. The lowest concentration of SARS-CoV-2 viral copies this assay can detect is 131 copies/mL. Tane Biegler negative result does not preclude SARS-Cov-2 infection and should not be used as the sole basis for treatment or other patient management decisions. Jameek Bruntz negative result may occur with  improper specimen collection/handling, submission of specimen other than nasopharyngeal swab, presence of viral mutation(s) within the areas targeted by this assay, and inadequate number of viral copies (<131 copies/mL). Taffie Eckmann negative result must be combined with clinical observations, patient history, and epidemiological information. The expected result is Negative. Fact Sheet for Patients:  PinkCheek.be Fact Sheet for Healthcare Providers:  GravelBags.it This test is not yet ap proved or cleared by the [place] FDA and  has been authorized for detection and/or diagnosis of SARS-CoV-2 by FDA under an Emergency Use Authorization (  EUA). This EUA will remain  in effect (meaning this test can be used) for the duration of the COVID-19 declaration  under Section 564(b)(1) of the Act, 21 U.S.C. section 360bbb-3(b)(1), unless the authorization is terminated or revoked sooner.    Influenza Shamicka Inga by PCR NEGATIVE NEGATIVE Final   Influenza B by PCR NEGATIVE NEGATIVE Final    Comment: (NOTE) The Xpert Xpress SARS-CoV-2/FLU/RSV assay is intended as an aid in  the diagnosis of influenza from Nasopharyngeal swab specimens and  should not be used as Gunhild Bautch sole basis for treatment. Nasal washings and  aspirates are unacceptable for Xpert Xpress SARS-CoV-2/FLU/RSV  testing. Fact Sheet for Patients: PinkCheek.be Fact Sheet for Healthcare Providers: GravelBags.it This test is not yet approved or cleared by the [place] FDA and  has been authorized for detection and/or diagnosis of SARS-CoV-2 by  FDA under an Emergency Use Authorization (EUA). This EUA will remain  in effect (meaning this test can be used) for the duration of the  Covid-19 declaration under Section 564(b)(1) of the Act, 21  U.S.C. section 360bbb-3(b)(1), unless the authorization is  terminated or revoked. Performed at [facility], [place] [address], [place], [place] [address]          Radiology Studies: DG CHEST PORT 1 VIEW  Result Date: 01/09/2020 CLINICAL DATA:  Hypoxia. EXAM: PORTABLE CHEST 1 VIEW COMPARISON:  January 05, 2020. FINDINGS: Stable cardiomegaly with mild central pulmonary vascular congestion is noted. No pneumothorax is noted. No significant pleural effusion is noted. Left-sided pacemaker is unchanged in position. Right internal jugular catheter is noted. Mild bibasilar subsegmental atelectasis is noted. Bony thorax is unremarkable. IMPRESSION: Stable cardiomegaly with mild central pulmonary vascular congestion. Mild bibasilar subsegmental atelectasis is noted. Electronically Signed   By: Marijo Conception M.D.   On: 01/09/2020 10:17        Scheduled Meds: . allopurinol  50 mg Oral Daily  .  apixaban  5 mg Oral BID  . arformoterol  15 mcg Nebulization BID  . budesonide (PULMICORT) nebulizer solution  0.25 mg Nebulization BID  . Chlorhexidine Gluconate Cloth  6 each Topical Daily  . famotidine  20 mg Oral Daily  . feeding supplement (ENSURE ENLIVE)  237 mL Oral TID BM  . folic acid  1 mg Oral Daily  . free water  200 mL Oral Q4H  . furosemide  60 mg Intravenous BID  . lactulose  20 g Oral BID  . levothyroxine  200 mcg Oral Q0600  . midodrine  5 mg Oral TID WC  . sodium chloride flush  10-40 mL Intracatheter Q12H  . spironolactone  25 mg Oral Daily  . thiamine  100 mg Oral Daily  . vitamin B-12  1,000 mcg Oral Daily   Continuous Infusions:    LOS: 11 days    Time spent: over 30 min    Fayrene Helper, MD [facility]   To contact the attending provider between 7A-7P or the covering provider during after hours 7P-7A, please log into the web site [email or web address] and access using universal [place] password for that web site. If you do not have the password, please call the hospital operator.  01/09/2020, 4:23 PM

## 2020-01-09 NOTE — Consult Note (Addendum)
Consultation Note Date: 01/09/2020   Patient Name: [name] [name]  DOB: 1946/09/09  MRN: [id number]  Age / Sex: 74 y.o., female  PCP: Biagio Borg, MD Referring Physician: Elodia Florence., *  Reason for Consultation: Establishing goals of care and Psychosocial/spiritual support  HPI/Patient Profile: 74 y.o. female   admitted on 01/08/2020 with significant   PMH of OSA, COPD on 2LNC, diastolic CHF, Afib, CKD III, SSS s/p PPM, PFO, hypothyroidism, depression, breast cancer s/p chemo and morbid obesity who presented to ED 4/10 with AMS after being found down at home. EMS was dispatched to patient's home after neighbors noticed mail was full. Upon EMS arrival, pt was hypoxic SpO2 80% on RA and had pressure ulcers on R side of body, and endorsed global myalgia. Patient reported that she sustained fall approx 6 days prior to EMS dispatch and was unable to stand self.   Multiple re hospitalizations in past six months.  Daughter reports continued physical and functional decline over the past many months and her ongoing concern for safety in the home.  She has filed for guardianship  In ED, pt hypoxic but VBG not overtly hypercarbic. CK 2000, Cr 2.48. Admitted for treatment and stabilization   Patient with progressively worsening resp status, as well as complex medical picture including rhabdo/AKI s/p fluid restriction due to pulm status, elevated BNP despite fluid restriction and ongoing diuresis, required transfer to ICU.  She was put on intermittent BiPAP.  Diuresed with IV Lasix with improvement in her breathing.    Today is day 12 of her hospitalization.   Patient remains encephalopathic/muti factorial, multiple co-morbidites, is  high risk for decompensation and continued failure to thrive.   She has poor po intake.  Family face treatment option decisions, advanced directive decsions and anticipatory care  needs.     Clinical Assessment and Goals of Care:   This NP Wadie Lessen reviewed medical records, received report from team, assessed the patient and then meet at the patient's bedside with daughter  to discuss diagnosis, prognosis, GOC, EOL wishes disposition and options.  Education regarding Hospice and Palliative Care was offered.  Created space and opportunity for daughter to explore her thoughts and feelings regarding current medical situation.  She expresses concern over her mother mental status and psychiatric underlying issues (? Bi-polar, depression, hoarding)  Unsure of previous OP psychiatric care, noted multiple psych medications on home med list, unsure of how recently prescribed or utilized.  Education was offered regarding advanced directives.  Concepts specific to code status, artifical feeding and hydration, continued IV antibiotics and rehospitalization was had.  The difference between an aggressive medical intervention path  and a palliative comfort care path for this patient at this time was had.  Values and goals of care important to patient and family were attempted to be elicited.  MOST form introduced, Hard Choices booklet left for review  Daughter understands the seriousness of her mother's current medical situation.  She remains hopeful for Improvement.  She is open to  all offered and available medical interventions to prolong life.  She is open to SNF for rehab  Recommend Palliative to follow on discharge at facility  Questions and concerns addressed.   Family encouraged to call with questions or concerns.    PMT will continue to support holistically.      NEXT OF KIN/ Devani Odonnel she has filed for guardianship     SUMMARY OF RECOMMENDATIONS    Code Status/Advance Care Planning:  DNR   Symptom Management:   Recommend psychiatry consult for  evaluation and medication recommendations    Palliative Prophylaxis:   Aspiration, Bowel Regimen,  Delirium Protocol, Frequent Pain Assessment and Oral Care  Additional Recommendations (Limitations, Scope, Preferences):  Full Scope Treatment  Psycho-social/Spiritual:   Desire for further Chaplaincy support:yes   Prognosis:   Unable to determine  Discharge Planning: [place] for rehab with Palliative care service follow-up      Primary Diagnoses: Present on Admission: . AMS (altered mental status)   I have reviewed the medical record, interviewed the patient and family, and examined the patient. The following aspects are pertinent.  Past Medical History:  Diagnosis Date  . Anemia   . Asthma 06/13/2011   hx of  . Atrial fibrillation ([place])   . Atrial flutter ([place])   . Breast cancer ([place]) dx'd 2005   No BP check or stick in Left arm  . CAD (coronary artery disease)    a. Nonobstructive CAD 2007 (EF 75%.  RCA  proximal 75% tubular, 25% prox LAD, 25% d1, 50% D2) at [facility]. b. NSTEMI 01/2010 in setting of respiratory failure, medical approach (not a good candidate for noninvasive eval)  . Cellulitis    hx of cellulitis in left leg  . Cervical radiculopathy 06/13/2011  . CHF (congestive heart failure) ([place])   . Complication of anesthesia    per patient "hard to wake up and come out of the aneshthesia, happened years ago"  . COPD (chronic obstructive pulmonary disease) ([place])    on O2  . Depression 06/13/2011  . Diverticulosis   . Esophageal dysmotility   . Fatty liver   . Gout 06/13/2011  . History of Clostridium difficile early dec 2015   no current diarrhea  . History of home oxygen therapy    2 liters per nasal cannula all the time  . Hx of dysfunctional uterine bleeding    last bleeding jan 2016, worked up for with d and c x 2 no cause found  . Hyperlipidemia   . Hypertension   . Hypothyroidism   . Impaired glucose tolerance 11/01/2012  . Myocardial infarction ([place])   . Neuropathy    per patient in hands and in feet  . Obesity hypoventilation  syndrome ([place]) 06/13/2011  . OSA on CPAP   . Personal history of chemotherapy   . Personal history of radiation therapy   . PFO (patent foramen ovale)   . Presence of permanent cardiac pacemaker   . Risk for falls   . Syncope and collapse    Bradycardia with pauses. S/P pacemaker  . Tubular adenoma of colon    Social History   Socioeconomic History  . Marital status: Divorced    Spouse name: Not on file  . Number of children: 2  . Years of education: Not on file  . Highest education level: Not on file  Occupational History  . Occupation: Retired  Tobacco Use  . Smoking status: Former Smoker    Packs/day: 1.00  Years: 20.00    Pack years: 20.00    Types: Cigarettes    Quit date: 09/20/1998    Years since quitting: 21.3  . Smokeless tobacco: Never Used  . Tobacco comment: 1ppd x 20 years  Substance and Sexual Activity  . Alcohol use: Yes    Comment: rare, on holidays, and on special occasions per patient on 10/25/2019  . Drug use: No  . Sexual activity: Not Currently    Birth control/protection: Post-menopausal  Other Topics Concern  . Not on file  Social History Narrative   Lives alone in an apartment on the first floor.   Has 2 children.  Retired Advertising copywriter.  Education: some college.    Social Determinants of Health   Financial Resource Strain: Low Risk   . Difficulty of Paying Living Expenses: Not very hard  Food Insecurity: No Food Insecurity  . Worried About Charity fundraiser in the Last Year: Never true  . Ran Out of Food in the Last Year: Never true  Transportation Needs: No Transportation Needs  . Lack of Transportation (Medical): No  . Lack of Transportation (Non-Medical): No  Physical Activity: Inactive  . Days of Exercise per Week: 0 days  . Minutes of Exercise per Session: 0 min  Stress: No Stress Concern Present  . Feeling of Stress : Only a little  Social Connections: Slightly Isolated  . Frequency of Communication with Friends and  Family: More than three times a week  . Frequency of Social Gatherings with Friends and Family: More than three times a week  . Attends Religious Services: More than 4 times per year  . Active Member of Clubs or Organizations: Yes  . Attends Archivist Meetings: More than 4 times per year  . Marital Status: Divorced   Family History  Problem Relation Age of Onset  . Uterine cancer Mother   . Heart disease Mother   . Breast cancer Sister 67  . Ovarian cancer Sister   . Breast cancer Cousin 28  . Diabetes Brother   . Breast cancer Maternal Aunt   . Ovarian cancer Sister    Scheduled Meds: . apixaban  5 mg Oral BID  . arformoterol  15 mcg Nebulization BID  . budesonide (PULMICORT) nebulizer solution  0.25 mg Nebulization BID  . Chlorhexidine Gluconate Cloth  6 each Topical Daily  . famotidine  20 mg Oral Daily  . feeding supplement (ENSURE ENLIVE)  237 mL Oral TID BM  . folic acid  1 mg Oral Daily  . free water  200 mL Oral Q4H  . furosemide  40 mg Intravenous BID  . lactulose  20 g Oral BID  . levothyroxine  200 mcg Oral Q0600  . midodrine  5 mg Oral TID WC  . sodium chloride flush  10-40 mL Intracatheter Q12H  . spironolactone  25 mg Oral Daily  . thiamine  100 mg Oral Daily  . vitamin B-12  1,000 mcg Oral Daily   Continuous Infusions: . dextrose 20 mL/hr at 01/08/20 1348   PRN Meds:.acetaminophen **OR** acetaminophen, albuterol, dextrose, ondansetron (ZOFRAN) IV, oxyCODONE Medications Prior to Admission:  Prior to Admission medications   Medication Sig Start Date End Date Taking? Authorizing Provider  albuterol (PROAIR HFA) 108 (90 Base) MCG/ACT inhaler Inhale 2 puffs into the lungs See admin instructions. Inhale 2 puffs into the lungs in the morning, 2 puffs at bedtime, and one to two times daily as needed for shortness of breath or wheezing  Yes [provider]  apixaban (ELIQUIS) 5 MG TABS tablet Take 1 tablet (5 mg total) by mouth 2 (two) times  daily. 10/02/19  Yes Biagio Borg, MD  AZO-CRANBERRY PO Take 1 tablet by mouth daily as needed (urinary symptoms).    Yes [provider]  B Complex Vitamins (VITAMIN B COMPLEX PO) Take 1 tablet by mouth daily.   Yes [provider]  budesonide-formoterol (SYMBICORT) 160-4.5 MCG/ACT inhaler Inhale 2 puffs into the lungs 2 (two) times daily. 10/22/19  Yes Biagio Borg, MD  cholecalciferol (VITAMIN D3) 25 MCG (1000 UNIT) tablet Take 2,000 Units by mouth daily.   Yes [provider]  diphenhydrAMINE (BENADRYL) 25 MG tablet Take 25 mg by mouth every 6 (six) hours as needed for allergies.   Yes [provider]  hydrOXYzine (ATARAX/VISTARIL) 25 MG tablet Take 25 mg by mouth 3 (three) times daily as needed for itching.   Yes [provider]  OXYGEN Inhale 2-3 L/min into the lungs continuous.    Yes [provider]  potassium chloride SA (KLOR-CON) 20 MEQ tablet Take 1 tablet (20 mEq total) by mouth 2 (two) times daily. 11/06/19  Yes Shelly Coss, MD  traMADol (ULTRAM) 50 MG tablet Take 1 tablet (50 mg total) by mouth daily as needed for moderate pain. Patient taking differently: Take 50 mg by mouth every 6 (six) hours as needed for moderate pain.  10/02/19  Yes Biagio Borg, MD  vitamin C (ASCORBIC ACID) 500 MG tablet Take 500 mg by mouth daily.    Yes [provider]  VITAMIN E PO Take 1,000 Units by mouth daily.    Yes [provider]  allopurinol (ZYLOPRIM) 300 MG tablet TAKE 1 TABLET DAILY Patient not taking: No sig reported 05/17/17   Biagio Borg, MD  amitriptyline (ELAVIL) 100 MG tablet Take 1 tablet (100 mg total) by mouth at bedtime. Patient not taking: Reported on 12/31/2019 06/13/19   Biagio Borg, MD  atorvastatin (LIPITOR) 40 MG tablet TAKE 1 TABLET DAILY Patient not taking: No sig reported 09/26/18   Lelon Perla, MD  clonazePAM (KLONOPIN) 0.5 MG tablet Take 1 tablet (0.5 mg total) by mouth 2 (two) times daily as  needed for anxiety. Patient not taking: Reported on 01/05/2020 12/19/19   Biagio Borg, MD  DULoxetine (CYMBALTA) 30 MG capsule TAKE 2 CAPSULES (60 MG TOTAL) BY MOUTH AT BEDTIME. Patient not taking: Reported on 01/11/2020 12/17/19   Biagio Borg, MD  furosemide (LASIX) 40 MG tablet Take 1 tablet (40 mg total) by mouth 2 (two) times daily. Patient not taking: Reported on 12/30/2019 11/06/19   Shelly Coss, MD  levothyroxine (SYNTHROID) 200 MCG tablet Take 1 tablet (200 mcg total) by mouth daily. Annual appt due in Sept must see provider for future refills Patient not taking: Reported on 12/31/2019 12/13/19   Biagio Borg, MD  metoprolol tartrate (LOPRESSOR) 25 MG tablet Take 0.5 tablets (12.5 mg total) by mouth as needed (for heart rate above 120 at rest). Patient not taking: Reported on 01/02/2020 09/28/19   Lelon Perla, MD  omeprazole (PRILOSEC) 40 MG capsule Take 1 capsule (40 mg total) by mouth daily. Patient not taking: Reported on 01/15/2020 05/10/19   Domenic Moras, PA-C   Allergies  Allergen Reactions  . Cephalexin Other (See Comments)    Exfoliative dermatitis reaction Fine on ceftriaxone  . Ciprofloxacin Other (See Comments)    Folliculitis   . Lisinopril Cough  .  Codeine     REACTION: unknown - pt. states unaware of having reaction to codeine  . Latex Dermatitis  . Tape Dermatitis  . Cleocin [Clindamycin Hcl] Rash  . Dilaudid [Hydromorphone] Other (See Comments)    Oversedation  . Doxycycline Rash  . Penicillins Rash    Did it involve swelling of the face/tongue/throat, SOB, or low BP? No Did it involve sudden or severe rash/hives, skin peeling, or any reaction on the inside of your mouth or nose? Yes Did you need to seek medical attention at a hospital or doctor's office?Yes When did it last happen?several years ago If all above answers are "NO", may proceed with cephalosporin use.   Review of Systems  Unable to perform ROS: Acuity of condition    Physical  Exam Constitutional:      Appearance: She is obese. She is ill-appearing.     Interventions: Nasal cannula in place.  Cardiovascular:     Rate and Rhythm: Normal rate.  Skin:    General: Skin is warm and dry.  Neurological:     Mental Status: She is lethargic.     Vital Signs: BP 133/70   Pulse 80   Temp 97.8 F (36.6 C) (Oral)   Resp 20   Ht 5' 7"  (1.702 m)   Wt 132.2 kg   SpO2 94%   BMI 45.65 kg/m  Pain Scale: Faces POSS *See Group Information*: S-Acceptable,Sleep, easy to arouse Pain Score: 0-No pain   SpO2: SpO2: 94 % O2 Device:SpO2: 94 % O2 Flow Rate: .O2 Flow Rate (L/min): 3 L/min  IO: Intake/output summary:   Intake/Output Summary (Last 24 hours) at 01/09/2020 1041 Last data filed at 01/09/2020 0425 Gross per 24 hour  Intake 610 ml  Output 700 ml  Net -90 ml    LBM: Last BM Date: 01/07/20 Baseline Weight: Weight: 136.1 kg Most recent weight: Weight: 132.2 kg     Palliative Assessment/Data: 30 % at best   Discussed with Dr Florene Glen and Liz/LCSW   Time In: 1015 Time Out: 1130 Time Total: 75 minutes Greater than 50%  of this time was spent counseling and coordinating care related to the above assessment and plan.  This nurse practitioner informed  The family and the attending that I will be out of the hospital until Monday morning.  If the patient is still hospitalized I will follow-up at that time.  Call palliative medicine team phone # [phone] with questions or concerns in there interim   Signed by: Wadie Lessen, NP   Please contact Palliative Medicine Team phone at [phone] for questions and concerns.  For individual provider: See Shea Evans

## 2020-01-09 NOTE — TOC Progression Note (Signed)
Transition of Care [facility]) - Progression Note    Patient Details  Name: [name] [name] MRN: [id number] Date of Birth: [birth year]  Transition of Care [facility]) CM/SW [place], [place] Phone Number: 01/09/2020, 12:26 PM  Clinical Narrative:     Patient's daughter [name] chose [facility], CSW contacted Juliann Pulse with [facility] to inform of choice and have insurance auth initiated. CSW requested updated covid test be ordered by MD.   Pending covid results and insurance auth at this time.    Expected Discharge Plan: [place] Barriers to Discharge: Continued Medical Work up, Unsafe home situation  Expected Discharge Plan and Services Expected Discharge Plan: [place] Choice: [place] arrangements for the past 2 months: Single Family Home                                       Social Determinants of Health (SDOH) Interventions    Readmission Risk Interventions Readmission Risk Prevention Plan 05/05/2019  Transportation Screening Complete  PCP or Specialist Appt within 3-5 Days Complete  HRI or [place] Complete  Social Work Consult for [place] Planning/Counseling Complete  Palliative Care Screening Not Applicable  Medication Review Press photographer) Complete  Some recent data might be hidden

## 2020-01-09 NOTE — Progress Notes (Signed)
   01/09/20 0425  Assess: MEWS Score  Temp 98.5 F (36.9 C)  BP (!) 92/53  Pulse Rate 80  ECG Heart Rate 84  Resp (!) 22  Level of Consciousness Alert  SpO2 99 %  O2 Device CPAP  Assess: MEWS Score  MEWS Temp 0  MEWS Systolic 1  MEWS Pulse 0  MEWS RR 1  MEWS LOC 0  MEWS Score 2  MEWS Score Color Yellow  Assess: if the MEWS score is Yellow or Red  Were vital signs taken at a resting state? Yes  Focused Assessment Documented focused assessment  Early Detection of Sepsis Score *See Row Information* Low  MEWS guidelines implemented *See Row Information* No, vital signs rechecked

## 2020-01-09 NOTE — Discharge Summary (Deleted)
PROGRESS NOTE    [name] [name]  HYI:[id number] DOB: [birth year] DOA: 01/07/2020 PCP: Biagio Borg, MD   Chief Complaint  Patient presents with  . Fall    Brief Narrative:  74 yo F PMH OSA, COPD on 2LNC, diastolic CHF, Afib, CKD III, SSS s/p PPM, PFO, hypothyroidism, depression, breast cancer s/p chemo and morbid obesity who presented to ED 4/10 with AMS after being found down at home. EMS was dispatched to patient's home after neighbors noticed mail was full. Upon EMS arrival, pt was hypoxic SpO2 80% on RA and had pressure ulcers on R side of body, and endorsed global myalgia. Patient reported that she sustained fall approx 6 days prior to EMS dispatch and was unable to stand self.   In ED, pt hypoxic but VBG not overtly hypercarbic. CK 2000, Cr 2.48. Admitted to [facility].  On 4/15, patient with progressively worsening resp status, as well as complex medical picture including rhabdo/AKI s/p fluid restriction due to pulm status, elevated BNP despite fluid restriction and ongoing diuresis.  Transfer to ICU.  She was put on intermittent BiPAP.  Diuresed with IV Lasix with improvement in her breathing.  She was transferred back to [facility] on 4/20.  Assessment & Plan:   Active Problems:   LFT elevation   AMS (altered mental status)   Non-traumatic rhabdomyolysis   Acute on chronic respiratory failure with hypoxia and hypercapnia (HCC)   Non-pressure chronic ulcer of other part of right foot limited to breakdown of skin ([place])   Fall   Moderate protein-calorie malnutrition (HCC)   Idiopathic chronic gout without tophus  Acute metabolic encephalopathy:  Multifactorial including respiratory failure although no significant hypercarbia.   TSH 6.63.  Free T4 normal.  B12 5.7K.  No obvious signs of infection.  No focal neuro deficit.  CT head without acute finding.  Ammonia 52 -> 77.  She is oriented to self and place which seems to have baseline per daughter. -ammonia elevated, with start lactulose -AM  VBG -Frequent orientation and delirium precautions.  Acute on chronic RF with hypoxia and hypercarbia: Multifactorial including OSA/possible OHS, COPD and PFO.  Not sure if she is compliant with CPAP at home.  On 2 L at baseline. -currently on 3 L [place] -Minimum oxygen to maintain saturation above 88% -CPAP prn and hs -Continue Brovana, pulmicort, prn albuterol nebs -CXR with mild central pulm vascular congestion  Acute on chronic diastolic heart failure: Echo on 4/11 with EF of 55 to 60%, no RWMA, indeterminate diastolic dysfunction, moderate LAE, severe RAE moderate TVR, moderate AVR and RVSP of 46.6.  BNP 2000 (higher than baseline).  Still with significant fluid overload clinically.  UOP 1200 cc 4/20. -increase IV Lasix 60 mg twice daily -Added Aldactone for hypokalemia and hyponatremia -Midodrine 5 mg 3 times daily for soft blood pressures -Strict intake and output -Sodium restriction -Repeat BNP elevated -Will involve cardiology vs renal if no improvement  AKI on CKD-3a/azotemia: Baseline Cr ~1.4> 2.48 (admit)> 2.61 (peak)>> 1.98> 2.15 > 2.17 today.  BUN 67 (admit)>> 85. Renal ultrasound without significant finding.  She could be Isaic Syler combination of prerenal insult in the setting of dehydration and possible pigment nephropathy from rhabdo.  Still with significant fluid overload on exam. -Continue monitoring -continue diuresis with IV lasix -May involve nephrology if no improvement  Hypervolemic hypernatremia: Na 147 (admit)> 156 (peak)> 146 -Diuretics as above. -Continue free water per p.o.   Fall at home Debility/physical deconditioning Traumatic rhabdomyolysis-resolved. -PT/OT recommended SNF.  Hypoglycemia: Likely due to poor Po p.o. intake. - d/c D10 and follow POC BG's  Paroxysmal Nicko Daher. Fib: Now in sinus rhythm without medications.  CHA2DS2-VASc score greater than 3. -Continue Eliquis for anticoagulation?  Need to discuss risk and benefit given risk of fall.  SSS s/p  PPM-stable.  Macrocytic anemia: B12 level high, folate wnl.  H&H stable. -iron, ferritin, retic  Hypothyroidism: TSH 6.6. -Continue home Synthroid  Thrombocytopenia: Improving. -Continue monitoring  Dysphagia -Appreciate SLP input-dysphagia 2 diet.  Morbid obesity:Body mass index is 45.23 kg/m. -Challenging issue given patient's comorbidity and immobility.  History of gout: Uric acid elevated but patient denies pain.  Could be due to renal failure. -Recheck uric acid -> downtrending, follow -restart allopurinol, low dose with aki  Goal of care discussion: Patient with significant comorbidities and debility as above.  She is Shelvy Heckert still full code.  Per previous provider "I do not believe patient has capacity to make reasonable medical decision.  I discussed with patient's daughter, [name] who states she is the only close family.  [name] is working to  [employer] and guardianship paper.  After discussion about patient's comorbidity, quality of life, CODE STATUS and pros and cons of CPR and intubation, [name] requested for CODE STATUS to be changed to DNR/DNI."  -CODE STATUS changed to DNR/DNI -Palliative care consulted  Nutrition Nutrition Problem: Inadequate oral intake Etiology: poor appetite  Signs/Symptoms: meal completion < 50%  Interventions: Prostat, Hormel Shake  R sided skin breakdown, present on admission-no signs of infection. -wound care, frequent turns.  DVT prophylaxis: eliquis Code Status: DNR Family Communication: none at bedside, daughter over phone Disposition:   Status is: Inpatient  Remains inpatient appropriate because:Altered mental status, IV treatments appropriate due to intensity of illness or inability to take PO, Inpatient level of care appropriate due to severity of illness and continued need for IV diuresis, monitoring of renal function in setting of volume overload   Dispo: The patient is from: Home              Anticipated d/c is to: SNF               Anticipated d/c date is: 3 days              Patient currently is not medically stable to d/c.  Consultants:   none  Procedures:  Echo IMPRESSIONS    1. Limted bubble study Bi atrial enlargement with pacing wire in RA/RV.  Mobile redundant atrial septum with positive bubble study for right to  left shunt fairly large amount of bubbles crossing septum.   Echo IMPRESSIONS    1. Left ventricular ejection fraction, by estimation, is 55 to 60%. The  left ventricle has normal function. The left ventricle has no regional  wall motion abnormalities. Left ventricular diastolic parameters are  indeterminate.  2. Right ventricular systolic function is normal. The right ventricular  size is normal. There is moderately elevated pulmonary artery systolic  pressure.  3. Left atrial size was moderately dilated.  4. Right atrial size was severely dilated.  5. The mitral valve is grossly normal. Mild to moderate mitral valve  regurgitation.  6. Tricuspid valve regurgitation is moderate.  7. The aortic valve is tricuspid. Aortic valve regurgitation is moderate.  Mild aortic valve stenosis.   LE [place] Summary:  RIGHT:  - Findings appear essentially unchanged compared to previous examination.  - There is no evidence of deep vein thrombosis in the lower extremity.  However, portions of  this examination were limited- see technologist  comments above.    LEFT:  - There is no evidence of deep vein thrombosis in the lower extremity.  However, portions of this examination were limited- see technologist  comments above.   UE [place] Summary:    Right:  No evidence of deep vein thrombosis in the upper extremity. However,  unable to  visualize the axillary, cephalic, and basilic veins.    Left:  No evidence of deep vein thrombosis in the upper extremity. However,  unable to  visualize the cephalic, basilic, and internal jugular veins.    Antimicrobials:  Anti-infectives  (From admission, onward)   Start     Dose/Rate Route Frequency Ordered Stop   01/02/20 0600  vancomycin (VANCOREADY) IVPB 1250 mg/250 mL  Status:  Discontinued     1,250 mg 166.7 mL/hr over 90 Minutes Intravenous Every 48 hours 12/31/19 0801 01/01/20 0757   12/31/19 1700  cefTRIAXone (ROCEPHIN) 2 g in sodium chloride 0.9 % 100 mL IVPB  Status:  Discontinued     2 g 200 mL/hr over 30 Minutes Intravenous Every 24 hours 12/31/19 1250 01/01/20 0757   12/31/19 1000  aztreonam (AZACTAM) 1 g in sodium chloride 0.9 % 100 mL IVPB  Status:  Discontinued     1 g 200 mL/hr over 30 Minutes Intravenous Every 8 hours 12/31/19 0801 12/31/19 1250   12/31/19 0900  vancomycin (VANCOREADY) IVPB 2000 mg/400 mL     2,000 mg 200 mL/hr over 120 Minutes Intravenous  Once 12/31/19 0801 12/31/19 1221   12/31/19 0800  metroNIDAZOLE (FLAGYL) IVPB 500 mg  Status:  Discontinued     500 mg 100 mL/hr over 60 Minutes Intravenous Every 8 hours 12/31/19 0753 01/01/20 0757      Subjective: [place]&Ox2, denies confusion (but seems confused on discussion) Wants to go home No c/o chest pain or SOB  Objective: Vitals:   01/09/20 0453 01/09/20 0752 01/09/20 0900 01/09/20 1150  BP: 133/70  101/71   Pulse: 80  88   Resp: 20  (!) 22   Temp: 98.4 F (36.9 C) 97.8 F (36.6 C)  98.1 F (36.7 C)  TempSrc:  Oral  Oral  SpO2: 94%  100%   Weight:      Height:        Intake/Output Summary (Last 24 hours) at 01/09/2020 1458 Last data filed at 01/09/2020 1200 Gross per 24 hour  Intake 890 ml  Output 1100 ml  Net -210 ml   Filed Weights   12/22/2019 1046 01/08/20 0940 01/09/20 0425  Weight: 136.1 kg 131 kg 132.2 kg    Examination:  General exam: Appears calm and comfortable  Respiratory system: distant breath sounds Cardiovascular system: S1 & S2 heard, RRR.  Gastrointestinal system: Abdomen is nondistended, soft and nontender.  Central nervous system: Alert and oriented x2. No focal neurological deficits. Extremities:  bilateral LE edema Skin: No rashes, lesions or ulcers Psychiatry: Judgement and insight appear normal. Mood & affect appropriate.     Data Reviewed: I have personally reviewed following labs and imaging studies  CBC: Recent Labs  Lab 01/05/20 0519 01/06/20 0434 01/07/20 0301 01/08/20 0423 01/09/20 0438  WBC 4.9 4.3 4.9 5.3 5.4  HGB 10.8* 10.0* 10.1* 9.9* 9.4*  HCT 39.0 35.0* 35.3* 34.3* 33.3*  MCV 105.7* 104.8* 104.7* 104.9* 105.0*  PLT 94* 115* 122* 133* 157    Basic Metabolic Panel: Recent Labs  Lab 01/06/20 0434 01/06/20 1200 01/07/20 0301 01/08/20 0423 01/09/20 0438  NA  149* 148* 146* 147* 146*  K 4.1 3.9 4.1 4.3 4.3  CL 112* 111 108 108 107  CO2 31 30 29 31 29   GLUCOSE 95 108* 97 97 125*  BUN 78* 76* 78* 85* 95*  CREATININE 2.02* 1.98* 2.08* 2.15* 2.17*  CALCIUM 9.1 9.1 9.4 9.3 9.5  MG  --   --   --   --  2.3  PHOS  --   --   --   --  4.1    GFR: Estimated Creatinine Clearance: 32.7 mL/min (Kalman Nylen) (by C-G formula based on SCr of 2.17 mg/dL (H)).  Liver Function Tests: Recent Labs  Lab 01/04/20 0540 01/04/20 0540 01/05/20 0519 01/06/20 0434 01/07/20 0301 01/09/20 0438 01/09/20 0726  AST 20  --  24 29 28   --  32  ALT 30  --  29 25 26   --  30  ALKPHOS 144*  --  163* 177* 173*  --  208*  BILITOT 1.9*  --  1.4* 1.1 0.9  --  0.7  PROT 6.1*  --  6.0* 5.9* 5.9*  --  6.0*  ALBUMIN 2.8*   < > 2.8* 2.6* 2.6* 2.6* 2.6*   < > = values in this interval not displayed.    CBG: Recent Labs  Lab 01/08/20 2125 01/09/20 0016 01/09/20 0407 01/09/20 0754 01/09/20 1152  GLUCAP 108* 129* 100* 86 98     Recent Results (from the past 240 hour(s))  MRSA PCR Screening     Status: Abnormal   Collection Time: 01/03/20  5:46 PM   Specimen: Nasal Mucosa; Nasopharyngeal  Result Value Ref Range Status   MRSA by PCR POSITIVE (Lydie Stammen) NEGATIVE Final    Comment:        The GeneXpert MRSA Assay (FDA approved for NASAL specimens only), is one component of Daivd Fredericksen comprehensive  MRSA colonization surveillance program. It is not intended to diagnose MRSA infection nor to guide or monitor treatment for MRSA infections. RESULT CALLED TO, READ BACK BY AND VERIFIED WITH: Garfield Cornea RN 01/03/20 2013 JDW Performed at [facility], [place] [address] [place], [place] [address]          Radiology Studies: DG CHEST PORT 1 VIEW  Result Date: 01/09/2020 CLINICAL DATA:  Hypoxia. EXAM: PORTABLE CHEST 1 VIEW COMPARISON:  January 05, 2020. FINDINGS: Stable cardiomegaly with mild central pulmonary vascular congestion is noted. No pneumothorax is noted. No significant pleural effusion is noted. Left-sided pacemaker is unchanged in position. Right internal jugular catheter is noted. Mild bibasilar subsegmental atelectasis is noted. Bony thorax is unremarkable. IMPRESSION: Stable cardiomegaly with mild central pulmonary vascular congestion. Mild bibasilar subsegmental atelectasis is noted. Electronically Signed   By: Marijo Conception M.D.   On: 01/09/2020 10:17        Scheduled Meds: . apixaban  5 mg Oral BID  . arformoterol  15 mcg Nebulization BID  . budesonide (PULMICORT) nebulizer solution  0.25 mg Nebulization BID  . Chlorhexidine Gluconate Cloth  6 each Topical Daily  . famotidine  20 mg Oral Daily  . feeding supplement (ENSURE ENLIVE)  237 mL Oral TID BM  . folic acid  1 mg Oral Daily  . free water  200 mL Oral Q4H  . furosemide  60 mg Intravenous BID  . lactulose  20 g Oral BID  . levothyroxine  200 mcg Oral Q0600  . midodrine  5 mg Oral TID WC  . sodium chloride flush  10-40 mL Intracatheter Q12H  .  spironolactone  25 mg Oral Daily  . thiamine  100 mg Oral Daily  . vitamin B-12  1,000 mcg Oral Daily   Continuous Infusions: . dextrose 20 mL/hr at 01/08/20 1348     LOS: 11 days    Time spent: over 30 min    Fayrene Helper, MD [facility]   To contact the attending provider between 7A-7P or the covering provider during after hours 7P-7A,  please log into the web site [email or web address] and access using universal [place] password for that web site. If you do not have the password, please call the hospital operator.  01/09/2020, 2:58 PM

## 2020-01-09 NOTE — Progress Notes (Signed)
Physical Therapy Treatment Patient Details Name: SAPHIRA LAHMANN MRN: [id number] DOB: 1946-05-07 Today's Date: 01/09/2020    History of Present Illness Past medical history of COPD on 2 LPM chronically, chronic respiratory failure, chronic CHF, OSA, A. fib, anemia, CKD 3, hypothyroidism, PPM implant.  Presented with a fall that occurred on Easter Sunday.  Patient lives alone and neighbors noted mailbox was full and packages were not picked up.  Significantly confused with multiple skin breakdowns and hypoxia on arrival.  Found to have acute kidney injury, hyperkalemia, hyponatremia, rhabdomyolysis and hypoxia.    PT Comments    Worked on sitting balance today with focus on trunk flexion as able. Due to decreased sitting balance, unable to attempt standing. Con't to recommend SNF.   Follow Up Recommendations  SNF;Supervision/Assistance - 24 hour     Equipment Recommendations  None recommended by PT;Other (comment)    Recommendations for Other Services       Precautions / Restrictions Precautions Precautions: Fall Precaution Comments: multiple areas of skin breakdown Required Braces or Orthoses: Other Brace Other Brace: B prevalon boots Restrictions Weight Bearing Restrictions: No    Mobility  Bed Mobility Overal bed mobility: Needs Assistance Bed Mobility: Supine to Sit Rolling: Total assist;+2 for physical assistance   Supine to sit: Max assist;Total assist;+2 for physical assistance Sit to supine: Max assist;Total assist;+2 for physical assistance   General bed mobility comments: A for legs and use of pad. Pt attempting to use arms to A with transfers, but not in a true meaning ful way, reaching towards dynamap on weheels, etc.  Transfers                 General transfer comment: Unable to safely attempt standing today due to decreased balance in sitting  Ambulation/Gait                 Stairs             Wheelchair Mobility    Modified Rankin  (Stroke Patients Only)       Balance Overall balance assessment: Needs assistance Sitting-balance support: Feet supported;Bilateral upper extremity supported Sitting balance-Leahy Scale: Zero Sitting balance - Comments: balance fluctuated between zero and poor. Worked on forward rocking and getting increased trunk/hip flexion. Pt sat for 12+ minutes Postural control: Posterior lean                                  Cognition Arousal/Alertness: Awake/alert Behavior During Therapy: Anxious;Flat affect(anxious with movement, but flat affect at rest) Overall Cognitive Status: Impaired/Different from baseline Area of Impairment: Attention;Safety/judgement;Following commands                 Orientation Level: Situation;Time;Place   Memory: Decreased short-term memory Following Commands: Follows one step commands inconsistently Safety/Judgement: Decreased awareness of safety;Decreased awareness of deficits Awareness: Intellectual Problem Solving: Slow processing;Decreased initiation        Exercises      General Comments        Pertinent Vitals/Pain Pain Assessment: Faces Faces Pain Scale: Hurts whole lot Pain Location: legs with movement Pain Descriptors / Indicators: Moaning;Grimacing Pain Intervention(s): Limited activity within patient's tolerance;Monitored during session;Repositioned    Home Living                      Prior Function            PT Goals (current goals can now  be found in the care plan section) Acute Rehab PT Goals Potential to Achieve Goals: Good Progress towards PT goals: Progressing toward goals(length of time sitting increased, but couldn't stand today)    Frequency    Min 2X/week      PT Plan Current plan remains appropriate    Co-evaluation PT/OT/SLP Co-Evaluation/Treatment: Yes Reason for Co-Treatment: Complexity of the patient's impairments (multi-system involvement);For patient/therapist safety PT  goals addressed during session: Mobility/safety with mobility;Balance        AM-PAC PT "6 Clicks" Mobility   Outcome Measure  Help needed turning from your back to your side while in a flat bed without using bedrails?: Total Help needed moving from lying on your back to sitting on the side of a flat bed without using bedrails?: Total Help needed moving to and from a bed to a chair (including a wheelchair)?: Total Help needed standing up from a chair using your arms (e.g., wheelchair or bedside chair)?: Total Help needed to walk in hospital room?: Total Help needed climbing 3-5 steps with a railing? : Total 6 Click Score: 6    End of Session Equipment Utilized During Treatment: Oxygen Activity Tolerance: Patient limited by fatigue;Patient limited by pain Patient left: in bed;with call bell/phone within reach;with bed alarm set Nurse Communication: Mobility status PT Visit Diagnosis: Other abnormalities of gait and mobility (R26.89);Muscle weakness (generalized) (M62.81);History of falling (Z91.81)     Time: 5003-7048 PT Time Calculation (min) (ACUTE ONLY): 27 min  Charges:  $Therapeutic Activity: 8-22 mins                     Gwendolyn Nishi L. Tamala Julian, [place] Pager [phone] 01/09/2020    Galen Manila 01/09/2020, 10:14 AM

## 2020-01-09 NOTE — Progress Notes (Signed)
RN aware of DC central line order

## 2020-01-09 NOTE — Progress Notes (Signed)
Occupational Therapy Treatment Patient Details Name: [name] [name] MRN: [id number] DOB: [birth year] Today's Date: 01/09/2020    History of present illness Past medical history of COPD on 2 LPM chronically, chronic respiratory failure, chronic CHF, OSA, A. fib, anemia, CKD 3, hypothyroidism, PPM implant.  Presented with a fall that occurred on Easter Sunday.  Patient lives alone and neighbors noted mailbox was full and packages were not picked up.  Significantly confused with multiple skin breakdowns and hypoxia on arrival.  Found to have acute kidney injury, hyperkalemia, hyponatremia, rhabdomyolysis and hypoxia.   OT comments  Patient making minimal progress towards goals in skilled OT session. Patient's session encompassed co-treat with PT in order to address functional mobility and ADLs. Pt disoriented for entirety of session, requiring max to total A +2 to complete any purposeful movement. Able to sit EOB for 12+ with external support +2, unable to hold self up independently for longer than 5 seconds. Pt noted to be reaching for dynamap with LUE and despite mutli-modal cues would perseverate on pulling forward. Pt discharge remains appropriate; will continue to follow acutely.     Follow Up Recommendations  SNF;Supervision/Assistance - 24 hour    Equipment Recommendations  Other (comment)(defer to next venue)    Recommendations for Other Services      Precautions / Restrictions Precautions Precautions: Fall Precaution Comments: multiple areas of skin breakdown Required Braces or Orthoses: Other Brace Other Brace: B prevalon boots Restrictions Weight Bearing Restrictions: No       Mobility Bed Mobility Overal bed mobility: Needs Assistance Bed Mobility: Supine to Sit Rolling: Total assist;+2 for physical assistance   Supine to sit: Max assist;Total assist;+2 for physical assistance Sit to supine: Max assist;Total assist;+2 for physical assistance   General bed mobility  comments: A for legs and use of pad. Pt attempting to use arms to A with transfers, but not in a true meaning ful way, reaching towards dynamap on weheels, etc.  Transfers Overall transfer level: Needs assistance               General transfer comment: Unable to safely attempt standing today due to decreased balance in sitting    Balance Overall balance assessment: Needs assistance Sitting-balance support: Feet supported;Bilateral upper extremity supported Sitting balance-Leahy Scale: Zero Sitting balance - Comments: balance fluctuated between zero and poor. Worked on forward rocking and getting increased trunk/hip flexion. Pt sat for 12+ minutes Postural control: Posterior lean                                 ADL either performed or assessed with clinical judgement   ADL Overall ADL's : Needs assistance/impaired     Grooming: Set up;Supervision/safety;Sitting;Maximal assistance Grooming Details (indicate cue type and reason): Max A and multi modal cues to complete thoroughly             Lower Body Dressing: Total assistance;Bed level Lower Body Dressing Details (indicate cue type and reason): Donning socks unable to elevate feet to assist             Functional mobility during ADLs: +2 for physical assistance;Maximal assistance;Total assistance General ADL Comments: Max to Total A +2 to mobilize to EOB, minimal initiation in any task, inappropriately assisting in pulling forward on tele box     Vision       Perception     Praxis      Cognition Arousal/Alertness: Awake/alert Behavior During  Therapy: Anxious;Flat affect Overall Cognitive Status: Impaired/Different from baseline Area of Impairment: Attention;Safety/judgement;Following commands;Orientation;Memory;Awareness;Problem solving                 Orientation Level: Situation;Time;Place Current Attention Level: Sustained Memory: Decreased short-term memory Following Commands:  Follows one step commands inconsistently Safety/Judgement: Decreased awareness of safety;Decreased awareness of deficits Awareness: Intellectual Problem Solving: Slow processing;Decreased initiation;Requires verbal cues;Requires tactile cues General Comments: Pt disoriented upon arrival, shouting at therapists "I know where I am! Theres blood everywhere" Required multi modal cues to follow commands as well as increased time, even with prompting less than 25% participation was noted        Exercises     Shoulder Instructions       General Comments      Pertinent Vitals/ Pain       Pain Assessment: Faces Faces Pain Scale: Hurts worst Pain Location: legs with movement Pain Descriptors / Indicators: Moaning;Grimacing Pain Intervention(s): Limited activity within patient's tolerance;Monitored during session;Repositioned  Home Living                                          Prior Functioning/Environment              Frequency  Min 2X/week        Progress Toward Goals  OT Goals(current goals can now be found in the care plan section)  Progress towards OT goals: Not progressing toward goals - comment(Extremely debilitated and disoriented)  Acute Rehab OT Goals Patient Stated Goal: None stated OT Goal Formulation: Patient unable to participate in goal setting Time For Goal Achievement: 01/16/20 Potential to Achieve Goals: [place] Discharge plan remains appropriate    Co-evaluation    PT/OT/SLP Co-Evaluation/Treatment: Yes Reason for Co-Treatment: Complexity of the patient's impairments (multi-system involvement);Necessary to address cognition/behavior during functional activity;For patient/therapist safety;To address functional/ADL transfers PT goals addressed during session: Mobility/safety with mobility;Balance OT goals addressed during session: ADL's and self-care;Strengthening/ROM      AM-PAC OT "6 Clicks" Daily Activity     Outcome  Measure   Help from another person eating meals?: Total Help from another person taking care of personal grooming?: Total Help from another person toileting, which includes using toliet, bedpan, or urinal?: Total Help from another person bathing (including washing, rinsing, drying)?: Total Help from another person to put on and taking off regular upper body clothing?: Total Help from another person to put on and taking off regular lower body clothing?: Total 6 Click Score: 6    End of Session Equipment Utilized During Treatment: Oxygen  OT Visit Diagnosis: Other abnormalities of gait and mobility (R26.89);Repeated falls (R29.6);Muscle weakness (generalized) (M62.81);Other symptoms and signs involving cognitive function;Pain Pain - part of body: Arm;Hip;Leg;Knee   Activity Tolerance Patient limited by lethargy;Patient limited by pain;Patient limited by fatigue   Patient Left in bed;with call bell/phone within reach;with bed alarm set   Nurse Communication Mobility status        Time: 6808-8110 OT Time Calculation (min): 27 min  Charges: OT General Charges $OT Visit: 1 Visit OT Treatments $Self Care/Home Management : 8-22 mins  Corinne Ports E. Topaz Raglin, COTA/L Acute Rehabilitation Services [phone] [place] 01/09/2020, 11:47 AM

## 2020-01-09 NOTE — Progress Notes (Signed)
To the best of my knowledge, the student's charting is accurate.  

## 2020-01-10 DIAGNOSIS — R404 Transient alteration of awareness: Secondary | ICD-10-CM | POA: Diagnosis not present

## 2020-01-10 DIAGNOSIS — R531 Weakness: Secondary | ICD-10-CM | POA: Diagnosis not present

## 2020-01-10 DIAGNOSIS — Z515 Encounter for palliative care: Secondary | ICD-10-CM | POA: Diagnosis not present

## 2020-01-10 DIAGNOSIS — J9601 Acute respiratory failure with hypoxia: Secondary | ICD-10-CM

## 2020-01-10 DIAGNOSIS — Z66 Do not resuscitate: Secondary | ICD-10-CM

## 2020-01-10 LAB — BLOOD GAS, VENOUS
Acid-Base Excess: 5.1 mmol/L — ABNORMAL HIGH (ref 0.0–2.0)
Bicarbonate: 31.2 mmol/L — ABNORMAL HIGH (ref 20.0–28.0)
FIO2: 28
O2 Saturation: 63.6 %
Patient temperature: 37
pCO2, Ven: 64.9 mmHg — ABNORMAL HIGH (ref 44.0–60.0)
pH, Ven: 7.302 (ref 7.250–7.430)
pO2, Ven: 38 mmHg (ref 32.0–45.0)

## 2020-01-10 LAB — IRON AND TIBC
Iron: 63 ug/dL (ref 28–170)
Saturation Ratios: 17 % (ref 10.4–31.8)
TIBC: 367 ug/dL (ref 250–450)
UIBC: 304 ug/dL

## 2020-01-10 LAB — HEPATIC FUNCTION PANEL
ALT: 49 U/L — ABNORMAL HIGH (ref 0–44)
AST: 78 U/L — ABNORMAL HIGH (ref 15–41)
Albumin: 2.6 g/dL — ABNORMAL LOW (ref 3.5–5.0)
Alkaline Phosphatase: 273 U/L — ABNORMAL HIGH (ref 38–126)
Bilirubin, Direct: 0.3 mg/dL — ABNORMAL HIGH (ref 0.0–0.2)
Indirect Bilirubin: 0.5 mg/dL (ref 0.3–0.9)
Total Bilirubin: 0.8 mg/dL (ref 0.3–1.2)
Total Protein: 6.1 g/dL — ABNORMAL LOW (ref 6.5–8.1)

## 2020-01-10 LAB — RENAL FUNCTION PANEL
Albumin: 2.6 g/dL — ABNORMAL LOW (ref 3.5–5.0)
Anion gap: 9 (ref 5–15)
BUN: 91 mg/dL — ABNORMAL HIGH (ref 8–23)
CO2: 30 mmol/L (ref 22–32)
Calcium: 9.6 mg/dL (ref 8.9–10.3)
Chloride: 106 mmol/L (ref 98–111)
Creatinine, Ser: 2.08 mg/dL — ABNORMAL HIGH (ref 0.44–1.00)
GFR calc Af Amer: 27 mL/min — ABNORMAL LOW (ref >60)
GFR calc non Af Amer: 23 mL/min — ABNORMAL LOW (ref >60)
Glucose, Bld: 88 mg/dL (ref 70–99)
Phosphorus: 3.7 mg/dL (ref 2.5–4.6)
Potassium: 4.4 mmol/L (ref 3.5–5.1)
Sodium: 145 mmol/L (ref 135–145)

## 2020-01-10 LAB — RETICULOCYTES
Immature Retic Fract: 28.2 % — ABNORMAL HIGH (ref 2.3–15.9)
RBC.: 3.07 MIL/uL — ABNORMAL LOW (ref 3.87–5.11)
Retic Count, Absolute: 70.5 10*3/uL (ref 19.0–186.0)
Retic Ct Pct: 2.4 % (ref 0.4–3.1)

## 2020-01-10 LAB — GLUCOSE, CAPILLARY
Glucose-Capillary: 121 mg/dL — ABNORMAL HIGH (ref 70–99)
Glucose-Capillary: 137 mg/dL — ABNORMAL HIGH (ref 70–99)
Glucose-Capillary: 78 mg/dL (ref 70–99)
Glucose-Capillary: 80 mg/dL (ref 70–99)
Glucose-Capillary: 85 mg/dL (ref 70–99)
Glucose-Capillary: 93 mg/dL (ref 70–99)

## 2020-01-10 LAB — CBC
HCT: 31.7 % — ABNORMAL LOW (ref 36.0–46.0)
Hemoglobin: 9.2 g/dL — ABNORMAL LOW (ref 12.0–15.0)
MCH: 29.8 pg (ref 26.0–34.0)
MCHC: 29 g/dL — ABNORMAL LOW (ref 30.0–36.0)
MCV: 102.6 fL — ABNORMAL HIGH (ref 80.0–100.0)
Platelets: 178 10*3/uL (ref 150–400)
RBC: 3.09 MIL/uL — ABNORMAL LOW (ref 3.87–5.11)
RDW: 18.1 % — ABNORMAL HIGH (ref 11.5–15.5)
WBC: 5.6 10*3/uL (ref 4.0–10.5)
nRBC: 0.5 % — ABNORMAL HIGH (ref 0.0–0.2)

## 2020-01-10 LAB — MAGNESIUM: Magnesium: 2.3 mg/dL (ref 1.7–2.4)

## 2020-01-10 LAB — URIC ACID: Uric Acid, Serum: 13.7 mg/dL — ABNORMAL HIGH (ref 2.5–7.1)

## 2020-01-10 LAB — HEPATITIS PANEL, ACUTE
HCV Ab: NONREACTIVE
Hep A IgM: NONREACTIVE
Hep B C IgM: NONREACTIVE
Hepatitis B Surface Ag: NONREACTIVE

## 2020-01-10 LAB — FERRITIN: Ferritin: 31 ng/mL (ref 11–307)

## 2020-01-10 LAB — AMMONIA: Ammonia: 73 umol/L — ABNORMAL HIGH (ref 9–35)

## 2020-01-10 MED ORDER — FUROSEMIDE 10 MG/ML IJ SOLN
60.0000 mg | Freq: Three times a day (TID) | INTRAMUSCULAR | Status: DC
  Administered 2020-01-10 – 2020-01-11 (×4): 60 mg via INTRAVENOUS
  Filled 2020-01-10 (×4): qty 6

## 2020-01-10 MED ORDER — LACTULOSE 10 GM/15ML PO SOLN
20.0000 g | Freq: Three times a day (TID) | ORAL | Status: DC
  Administered 2020-01-10 – 2020-01-11 (×3): 20 g via ORAL
  Filled 2020-01-10 (×3): qty 30

## 2020-01-10 MED ORDER — FERROUS SULFATE 325 (65 FE) MG PO TABS
325.0000 mg | ORAL_TABLET | Freq: Every day | ORAL | Status: DC
  Administered 2020-01-11 – 2020-01-16 (×6): 325 mg via ORAL
  Filled 2020-01-10 (×7): qty 1

## 2020-01-10 NOTE — Progress Notes (Signed)
PROGRESS NOTE    [name] [name]  OVZ:[id number] DOB: 04-Sep-1946 DOA: 01/15/2020 PCP: Biagio Borg, MD   Chief Complaint  Patient presents with  . Fall    Brief Narrative:  74 yo F PMH OSA, COPD on 2LNC, diastolic CHF, Afib, CKD III, SSS s/p PPM, PFO, hypothyroidism, depression, breast cancer s/p chemo and morbid obesity who presented to ED 4/10 with AMS after being found down at home. EMS was dispatched to patient's home after neighbors noticed mail was full. Upon EMS arrival, pt was hypoxic SpO2 80% on RA and had pressure ulcers on R side of body, and endorsed global myalgia. Patient reported that she sustained fall approx 6 days prior to EMS dispatch and was unable to stand self.   In ED, pt hypoxic but VBG not overtly hypercarbic. CK 2000, Cr 2.48. Admitted to [facility].  On 4/15, patient with progressively worsening resp status, as well as complex medical picture including rhabdo/AKI s/p fluid restriction due to pulm status, elevated BNP despite fluid restriction and ongoing diuresis.  Transfer to ICU.  She was put on intermittent BiPAP.  Diuresed with IV Lasix with improvement in her breathing.  She was transferred back to [facility] on 4/20.  Assessment & Plan:   Active Problems:   LFT elevation   AMS (altered mental status)   Non-traumatic rhabdomyolysis   Acute on chronic respiratory failure with hypoxia and hypercapnia (HCC)   Non-pressure chronic ulcer of other part of right foot limited to breakdown of skin ([place])   Fall   Moderate protein-calorie malnutrition (HCC)   Idiopathic chronic gout without tophus  Acute metabolic encephalopathy:  Multifactorial including respiratory failure although no significant hypercarbia.   TSH 6.63.  Free T4 normal.  B12 5.7K.  No obvious signs of infection.  No focal neuro deficit.  CT head without acute finding.  Ammonia 52 -> 77.  She is oriented to self and place which seems to have baseline per daughter. -ammonia elevated, with start lactulose -AM  VBG (not collected, will discuss with RN) -Frequent orientation and delirium precautions.  Acute on chronic RF with hypoxia and hypercarbia: Multifactorial including OSA/possible OHS, COPD and PFO.  Not sure if she is compliant with CPAP at home.  On 2 L at baseline. -currently on 3 L [place] -Minimum oxygen to maintain saturation above 88% -CPAP prn and hs -Continue Brovana, pulmicort, prn albuterol nebs -CXR with mild central pulm vascular congestion  Acute on chronic diastolic heart failure: Echo on 4/11 with EF of 55 to 60%, no RWMA, indeterminate diastolic dysfunction, moderate LAE, severe RAE moderate TVR, moderate AVR and RVSP of 46.6.  BNP 2000 (higher than baseline).  Still with significant fluid overload clinically.  UOP 2.1 L 4/21. -IV Lasix 60 mg twice daily -continue spironolactone -Midodrine 5 mg 3 times daily for soft blood pressures -Strict intake and output -Sodium restriction -Repeat BNP elevated -Will involve cardiology/renal if no improvement  AKI on CKD-3a/azotemia: Baseline Cr ~1.4> 2.48 (admit)> 2.61 (peak)>> 1.98> 2.15 > 2.17 > 2.08 today.  Renal ultrasound without significant finding.  She could be Lasasha Brophy combination of prerenal insult in the setting of dehydration and possible pigment nephropathy from rhabdo.  Still with significant fluid overload on exam. -Continue monitoring -continue diuresis with IV lasix -May involve nephrology if no improvement  Hypervolemic hypernatremia: Na 147 (admit)> 156 (peak)> 146 -Diuretics as above. -Continue free water per p.o.   Fall at home Debility/physical deconditioning Traumatic rhabdomyolysis-resolved. -PT/OT recommended SNF.  Hypoglycemia: Likely due  to poor Po p.o. intake. - d/c D10 and follow POC BG's  Paroxysmal Carma Dwiggins. Fib: Now in sinus rhythm without medications.  CHA2DS2-VASc score greater than 3. -Continue Eliquis for anticoagulation for now.  Need to discuss long term risk and benefit given risk of fall.  SSS  s/p PPM-stable.  Macrocytic anemia: B12 level high, folate wnl.  H&H stable. - ferritin low normal, overall labs wnl.  Hypoproliferative retics. - will start PO iron  Hypothyroidism: TSH 6.6. -Continue home Synthroid  Thrombocytopenia: Improving. -Continue monitoring  Dysphagia -Appreciate SLP input-dysphagia 2 diet.  Morbid obesity:Body mass index is 45.23 kg/m. -Challenging issue given patient's comorbidity and immobility.  History of gout: Uric acid elevated but patient denies pain.  Could be due to renal failure. -Recheck uric acid -> downtrending, follow -restart allopurinol, low dose with aki  Goal of care discussion: Patient with significant comorbidities and debility as above.  She is Royanne Warshaw still full code.  Per previous provider "I do not believe patient has capacity to make reasonable medical decision.  I discussed with patient's daughter, [name] who states she is the only close family.  [name] is working to  [employer] and guardianship paper.  After discussion about patient's comorbidity, quality of life, CODE STATUS and pros and cons of CPR and intubation, [name] requested for CODE STATUS to be changed to DNR/DNI."  -CODE STATUS changed to DNR/DNI -Palliative care consulted  Nutrition Nutrition Problem: Inadequate oral intake Etiology: poor appetite  Signs/Symptoms: meal completion < 50%  Interventions: Prostat, Hormel Shake  R sided skin breakdown, present on admission-no signs of infection. -wound care, frequent turns.  RIJ central line Discontinue central line, establish PIV (central line still in place today, will discuss removal with RN)  Foley in place:  Continue for now for strict I/O  Elevated LFT's: mild, follow acute hepatitis panel, continue to monitor.  RUQ [place] 4/21 with sludge and stones with nonspecific gallbladder wall thickening (2/2 acute vs chronic cholecystitis, hepatic disease, or edema forming states).  Echogenic liver.  ? Cirrhosis.  DVT  prophylaxis: eliquis Code Status: DNR Family Communication: none at bedside, daughter over phone 4/21 Disposition:   Status is: Inpatient  Remains inpatient appropriate because:Altered mental status, IV treatments appropriate due to intensity of illness or inability to take PO, Inpatient level of care appropriate due to severity of illness and continued need for IV diuresis, monitoring of renal function in setting of volume overload   Dispo: The patient is from: Home              Anticipated d/c is to: SNF              Anticipated d/c date is: 3 days              Patient currently is not medically stable to d/c.  Consultants:   none  Procedures:  Echo IMPRESSIONS    1. Limted bubble study Bi atrial enlargement with pacing wire in RA/RV.  Mobile redundant atrial septum with positive bubble study for right to  left shunt fairly large amount of bubbles crossing septum.   Echo IMPRESSIONS    1. Left ventricular ejection fraction, by estimation, is 55 to 60%. The  left ventricle has normal function. The left ventricle has no regional  wall motion abnormalities. Left ventricular diastolic parameters are  indeterminate.  2. Right ventricular systolic function is normal. The right ventricular  size is normal. There is moderately elevated pulmonary artery systolic  pressure.  3. Left atrial  size was moderately dilated.  4. Right atrial size was severely dilated.  5. The mitral valve is grossly normal. Mild to moderate mitral valve  regurgitation.  6. Tricuspid valve regurgitation is moderate.  7. The aortic valve is tricuspid. Aortic valve regurgitation is moderate.  Mild aortic valve stenosis.   LE [place] Summary:  RIGHT:  - Findings appear essentially unchanged compared to previous examination.  - There is no evidence of deep vein thrombosis in the lower extremity.  However, portions of this examination were limited- see technologist  comments above.    LEFT:  -  There is no evidence of deep vein thrombosis in the lower extremity.  However, portions of this examination were limited- see technologist  comments above.   UE [place] Summary:    Right:  No evidence of deep vein thrombosis in the upper extremity. However,  unable to  visualize the axillary, cephalic, and basilic veins.    Left:  No evidence of deep vein thrombosis in the upper extremity. However,  unable to  visualize the cephalic, basilic, and internal jugular veins.    Antimicrobials:  Anti-infectives (From admission, onward)   Start     Dose/Rate Route Frequency Ordered Stop   01/02/20 0600  vancomycin (VANCOREADY) IVPB 1250 mg/250 mL  Status:  Discontinued     1,250 mg 166.7 mL/hr over 90 Minutes Intravenous Every 48 hours 12/31/19 0801 01/01/20 0757   12/31/19 1700  cefTRIAXone (ROCEPHIN) 2 g in sodium chloride 0.9 % 100 mL IVPB  Status:  Discontinued     2 g 200 mL/hr over 30 Minutes Intravenous Every 24 hours 12/31/19 1250 01/01/20 0757   12/31/19 1000  aztreonam (AZACTAM) 1 g in sodium chloride 0.9 % 100 mL IVPB  Status:  Discontinued     1 g 200 mL/hr over 30 Minutes Intravenous Every 8 hours 12/31/19 0801 12/31/19 1250   12/31/19 0900  vancomycin (VANCOREADY) IVPB 2000 mg/400 mL     2,000 mg 200 mL/hr over 120 Minutes Intravenous  Once 12/31/19 0801 12/31/19 1221   12/31/19 0800  metroNIDAZOLE (FLAGYL) IVPB 500 mg  Status:  Discontinued     500 mg 100 mL/hr over 60 Minutes Intravenous Every 8 hours 12/31/19 0753 01/01/20 0757      Subjective: Confused today, denies complaints but Maclain Cohron little less talkative today Knows hospital, but confused and says [facility]  Objective: Vitals:   01/10/20 0600 01/10/20 0700 01/10/20 0800 01/10/20 0831  BP: 106/75 (!) 101/59 100/60   Pulse: 95 86    Resp: 15 15 (!) 22 20  Temp:      TempSrc:      SpO2: 100% 100%    Weight:      Height:        Intake/Output Summary (Last 24 hours) at 01/10/2020 0850 Last data filed at 01/10/2020  0405 Gross per 24 hour  Intake 1638.72 ml  Output 2150 ml  Net -511.28 ml   Filed Weights   01/08/20 0940 01/09/20 0425 01/10/20 0033  Weight: 131 kg 132.2 kg 132.1 kg    Examination:  General: No acute distress. Cardiovascular: Heart sounds show Jaelyne Deeg regular rate, and rhythm. Lungs: distant breath sounds Abdomen: Soft, nontender, nondistended Neurological: Alert and oriented 3. Moves all extremities 4. Cranial nerves II through XII grossly intact. Skin: Warm and dry. No rashes or lesions. Extremities: diffuse edema to upper and lower extremities     Data Reviewed: I have personally reviewed following labs and imaging studies  CBC: Recent  Labs  Lab 01/06/20 0434 01/07/20 0301 01/08/20 0423 01/09/20 0438 01/10/20 0423  WBC 4.3 4.9 5.3 5.4 5.6  HGB 10.0* 10.1* 9.9* 9.4* 9.2*  HCT 35.0* 35.3* 34.3* 33.3* 31.7*  MCV 104.8* 104.7* 104.9* 105.0* 102.6*  PLT 115* 122* 133* 153 720    Basic Metabolic Panel: Recent Labs  Lab 01/06/20 1200 01/07/20 0301 01/08/20 0423 01/09/20 0438 01/10/20 0423  NA 148* 146* 147* 146* 145  K 3.9 4.1 4.3 4.3 4.4  CL 111 108 108 107 106  CO2 30 29 31 29 30   GLUCOSE 108* 97 97 125* 88  BUN 76* 78* 85* 95* 91*  CREATININE 1.98* 2.08* 2.15* 2.17* 2.08*  CALCIUM 9.1 9.4 9.3 9.5 9.6  MG  --   --   --  2.3 2.3  PHOS  --   --   --  4.1 3.7    GFR: Estimated Creatinine Clearance: 34.1 mL/min (Suyash Amory) (by C-G formula based on SCr of 2.08 mg/dL (H)).  Liver Function Tests: Recent Labs  Lab 01/05/20 0519 01/05/20 0519 01/06/20 0434 01/07/20 0301 01/09/20 0438 01/09/20 0726 01/10/20 0423  AST 24  --  29 28  --  32 78*  ALT 29  --  25 26  --  30 49*  ALKPHOS 163*  --  177* 173*  --  208* 273*  BILITOT 1.4*  --  1.1 0.9  --  0.7 0.8  PROT 6.0*  --  5.9* 5.9*  --  6.0* 6.1*  ALBUMIN 2.8*   < > 2.6* 2.6* 2.6* 2.6* 2.6*  2.6*   < > = values in this interval not displayed.    CBG: Recent Labs  Lab 01/09/20 1559 01/09/20 2009  01/10/20 0029 01/10/20 0402 01/10/20 0819  GLUCAP 93 89 80 78 85     Recent Results (from the past 240 hour(s))  MRSA PCR Screening     Status: Abnormal   Collection Time: 01/03/20  5:46 PM   Specimen: Nasal Mucosa; Nasopharyngeal  Result Value Ref Range Status   MRSA by PCR POSITIVE (Juwaun Inskeep) NEGATIVE Final    Comment:        The GeneXpert MRSA Assay (FDA approved for NASAL specimens only), is one component of Reuel Lamadrid comprehensive MRSA colonization surveillance program. It is not intended to diagnose MRSA infection nor to guide or monitor treatment for MRSA infections. RESULT CALLED TO, READ BACK BY AND VERIFIED WITH: Garfield Cornea RN 01/03/20 2013 JDW Performed at [facility], [place] [address] [place], [place] [address]   Respiratory Panel by RT PCR (Flu Sumiko Ceasar&B, Covid) - Nasopharyngeal Swab     Status: None   Collection Time: 01/09/20  2:41 PM   Specimen: Nasopharyngeal Swab  Result Value Ref Range Status   SARS Coronavirus 2 by RT PCR NEGATIVE NEGATIVE Final    Comment: (NOTE) SARS-CoV-2 target nucleic acids are NOT DETECTED. The SARS-CoV-2 RNA is generally detectable in upper respiratoy specimens during the acute phase of infection. The lowest concentration of SARS-CoV-2 viral copies this assay can detect is 131 copies/mL. Kaja Jackowski negative result does not preclude SARS-Cov-2 infection and should not be used as the sole basis for treatment or other patient management decisions. Thaily Hackworth negative result may occur with  improper specimen collection/handling, submission of specimen other than nasopharyngeal swab, presence of viral mutation(s) within the areas targeted by this assay, and inadequate number of viral copies (<131 copies/mL). Alajia Schmelzer negative result must be combined with clinical observations, patient history, and epidemiological information. The  expected result is Negative. Fact Sheet for Patients:  PinkCheek.be Fact Sheet for Healthcare Providers:   GravelBags.it This test is not yet ap proved or cleared by the [place] FDA and  has been authorized for detection and/or diagnosis of SARS-CoV-2 by FDA under an Emergency Use Authorization (EUA). This EUA will remain  in effect (meaning this test can be used) for the duration of the COVID-19 declaration under Section 564(b)(1) of the Act, 21 U.S.C. section 360bbb-3(b)(1), unless the authorization is terminated or revoked sooner.    Influenza Adonay Scheier by PCR NEGATIVE NEGATIVE Final   Influenza B by PCR NEGATIVE NEGATIVE Final    Comment: (NOTE) The Xpert Xpress SARS-CoV-2/FLU/RSV assay is intended as an aid in  the diagnosis of influenza from Nasopharyngeal swab specimens and  should not be used as Rylann Munford sole basis for treatment. Nasal washings and  aspirates are unacceptable for Xpert Xpress SARS-CoV-2/FLU/RSV  testing. Fact Sheet for Patients: PinkCheek.be Fact Sheet for Healthcare Providers: GravelBags.it This test is not yet approved or cleared by the [place] FDA and  has been authorized for detection and/or diagnosis of SARS-CoV-2 by  FDA under an Emergency Use Authorization (EUA). This EUA will remain  in effect (meaning this test can be used) for the duration of the  Covid-19 declaration under Section 564(b)(1) of the Act, 21  U.S.C. section 360bbb-3(b)(1), unless the authorization is  terminated or revoked. Performed at [facility], [place] [address] [place], [place] [address]          Radiology Studies: DG CHEST PORT 1 VIEW  Result Date: 01/09/2020 CLINICAL DATA:  Hypoxia. EXAM: PORTABLE CHEST 1 VIEW COMPARISON:  January 05, 2020. FINDINGS: Stable cardiomegaly with mild central pulmonary vascular congestion is noted. No pneumothorax is noted. No significant pleural effusion is noted. Left-sided pacemaker is unchanged in position. Right internal jugular catheter is noted. Mild  bibasilar subsegmental atelectasis is noted. Bony thorax is unremarkable. IMPRESSION: Stable cardiomegaly with mild central pulmonary vascular congestion. Mild bibasilar subsegmental atelectasis is noted. Electronically Signed   By: Marijo Conception M.D.   On: 01/09/2020 10:17        Scheduled Meds: . allopurinol  50 mg Oral Daily  . apixaban  5 mg Oral BID  . arformoterol  15 mcg Nebulization BID  . budesonide (PULMICORT) nebulizer solution  0.25 mg Nebulization BID  . Chlorhexidine Gluconate Cloth  6 each Topical Daily  . famotidine  20 mg Oral Daily  . feeding supplement (ENSURE ENLIVE)  237 mL Oral TID BM  . folic acid  1 mg Oral Daily  . free water  200 mL Oral Q4H  . furosemide  60 mg Intravenous BID  . lactulose  20 g Oral BID  . levothyroxine  200 mcg Oral Q0600  . midodrine  5 mg Oral TID WC  . sodium chloride flush  10-40 mL Intracatheter Q12H  . spironolactone  25 mg Oral Daily  . thiamine  100 mg Oral Daily  . vitamin B-12  1,000 mcg Oral Daily   Continuous Infusions:    LOS: 12 days    Time spent: over 30 min    Fayrene Helper, MD [facility]   To contact the attending provider between 7A-7P or the covering provider during after hours 7P-7A, please log into the web site [email or web address] and access using universal [place] password for that web site. If you do not have the password, please call the hospital operator.  01/10/2020, 8:50 AM

## 2020-01-10 NOTE — Progress Notes (Signed)
   01/10/20 0111  Assess: MEWS Score  BP (!) 96/55  ECG Heart Rate 80  Resp (!) 26  SpO2 100 %  O2 Device CPAP  Assess: MEWS Score  MEWS Temp 0  MEWS Systolic 1  MEWS Pulse 0  MEWS RR 2  MEWS LOC 0  MEWS Score 3  MEWS Score Color Yellow  Assess: if the MEWS score is Yellow or Red  Were vital signs taken at a resting state? Yes  Focused Assessment Documented focused assessment  Early Detection of Sepsis Score *See Row Information* Low  MEWS guidelines implemented *See Row Information* Yes  Treat  MEWS Interventions Other (Comment)  Take Vital Signs  Increase Vital Sign Frequency  Yellow: Q 2hr X 2 then Q 4hr X 2, if remains yellow, continue Q 4hrs  Escalate  MEWS: Escalate Yellow: discuss with charge nurse/RN and consider discussing with provider and RRT   Patient MEWS yellow due to low BP and elevated RR.  Patient has been green during shift but has had periods of yellow during previous shifts due to elevated RR.  Will cycle implement yellow MEWS VS frequency.  Agricultural consultant notified.

## 2020-01-10 NOTE — TOC Progression Note (Addendum)
Transition of Care [facility]) - Progression Note    Patient Details  Name: [name] [name] MRN: [id number] Date of Birth: Jan 26, 1946  Transition of Care [facility]) CM/SW [place], [place] Phone Number: 01/10/2020, 10:08 AM  Clinical Narrative:     Update: CSW received message from [place] at [facility] that they have insurance auth. MD reports patient will need a few more days inpatient, facility updated.   CSW notes COVID test from 4/21 back negative, CSW spoke with Juliann Pulse at [facility], pending [facility] at this time.    Expected Discharge Plan: [place] Barriers to Discharge: Continued Medical Work up, Unsafe home situation  Expected Discharge Plan and Services Expected Discharge Plan: [place] Choice: [place] arrangements for the past 2 months: Single Family Home                                       Social Determinants of Health (SDOH) Interventions    Readmission Risk Interventions Readmission Risk Prevention Plan 05/05/2019  Transportation Screening Complete  PCP or Specialist Appt within 3-5 Days Complete  HRI or [place] Complete  Social Work Consult for [place] Planning/Counseling Complete  Palliative Care Screening Not Applicable  Medication Review Press photographer) Complete  Some recent data might be hidden

## 2020-01-11 DIAGNOSIS — N179 Acute kidney failure, unspecified: Secondary | ICD-10-CM | POA: Diagnosis not present

## 2020-01-11 LAB — GLUCOSE, CAPILLARY
Glucose-Capillary: 100 mg/dL — ABNORMAL HIGH (ref 70–99)
Glucose-Capillary: 104 mg/dL — ABNORMAL HIGH (ref 70–99)
Glucose-Capillary: 104 mg/dL — ABNORMAL HIGH (ref 70–99)
Glucose-Capillary: 121 mg/dL — ABNORMAL HIGH (ref 70–99)
Glucose-Capillary: 83 mg/dL (ref 70–99)
Glucose-Capillary: 92 mg/dL (ref 70–99)

## 2020-01-11 LAB — BLOOD GAS, VENOUS
Acid-Base Excess: 4.5 mmol/L — ABNORMAL HIGH (ref 0.0–2.0)
Bicarbonate: 31.4 mmol/L — ABNORMAL HIGH (ref 20.0–28.0)
Drawn by: 5049
FIO2: 36
O2 Saturation: 86.7 %
Patient temperature: 36.9
pCO2, Ven: 75.1 mmHg (ref 44.0–60.0)
pH, Ven: 7.244 — ABNORMAL LOW (ref 7.250–7.430)
pO2, Ven: 59.5 mmHg — ABNORMAL HIGH (ref 32.0–45.0)

## 2020-01-11 LAB — CBC
HCT: 32.9 % — ABNORMAL LOW (ref 36.0–46.0)
Hemoglobin: 9.5 g/dL — ABNORMAL LOW (ref 12.0–15.0)
MCH: 29.8 pg (ref 26.0–34.0)
MCHC: 28.9 g/dL — ABNORMAL LOW (ref 30.0–36.0)
MCV: 103.1 fL — ABNORMAL HIGH (ref 80.0–100.0)
Platelets: 215 10*3/uL (ref 150–400)
RBC: 3.19 MIL/uL — ABNORMAL LOW (ref 3.87–5.11)
RDW: 18.4 % — ABNORMAL HIGH (ref 11.5–15.5)
WBC: 5.8 10*3/uL (ref 4.0–10.5)
nRBC: 0.7 % — ABNORMAL HIGH (ref 0.0–0.2)

## 2020-01-11 LAB — COMPREHENSIVE METABOLIC PANEL
ALT: 60 U/L — ABNORMAL HIGH (ref 0–44)
AST: 69 U/L — ABNORMAL HIGH (ref 15–41)
Albumin: 2.7 g/dL — ABNORMAL LOW (ref 3.5–5.0)
Alkaline Phosphatase: 305 U/L — ABNORMAL HIGH (ref 38–126)
Anion gap: 8 (ref 5–15)
BUN: 91 mg/dL — ABNORMAL HIGH (ref 8–23)
CO2: 32 mmol/L (ref 22–32)
Calcium: 9.6 mg/dL (ref 8.9–10.3)
Chloride: 106 mmol/L (ref 98–111)
Creatinine, Ser: 1.97 mg/dL — ABNORMAL HIGH (ref 0.44–1.00)
GFR calc Af Amer: 29 mL/min — ABNORMAL LOW (ref >60)
GFR calc non Af Amer: 25 mL/min — ABNORMAL LOW (ref >60)
Glucose, Bld: 103 mg/dL — ABNORMAL HIGH (ref 70–99)
Potassium: 4.4 mmol/L (ref 3.5–5.1)
Sodium: 146 mmol/L — ABNORMAL HIGH (ref 135–145)
Total Bilirubin: 1.1 mg/dL (ref 0.3–1.2)
Total Protein: 6.2 g/dL — ABNORMAL LOW (ref 6.5–8.1)

## 2020-01-11 LAB — MAGNESIUM: Magnesium: 2.3 mg/dL (ref 1.7–2.4)

## 2020-01-11 LAB — AMMONIA: Ammonia: 91 umol/L — ABNORMAL HIGH (ref 9–35)

## 2020-01-11 MED ORDER — LACTULOSE 10 GM/15ML PO SOLN
20.0000 g | Freq: Four times a day (QID) | ORAL | Status: DC
  Administered 2020-01-11 (×2): 20 g via ORAL
  Filled 2020-01-11 (×2): qty 30

## 2020-01-11 NOTE — Progress Notes (Addendum)
PROGRESS NOTE    [name] [name]  MLJ:[id number] DOB: Oct 27, 1945 DOA: 01/12/2020 PCP: [name] Borg, MD   Chief Complaint  Patient presents with  . Fall    Brief Narrative:  74 yo F PMH OSA, COPD on 2LNC, diastolic CHF, Afib, CKD III, SSS s/p PPM, PFO, hypothyroidism, depression, breast cancer s/p chemo and morbid obesity who presented to ED 4/10 with AMS after being found down at home. EMS was dispatched to patient's home after neighbors noticed mail was full. Upon EMS arrival, pt was hypoxic SpO2 80% on RA and had pressure ulcers on R side of body, and endorsed global myalgia. Patient reported that she sustained fall approx 6 days prior to EMS dispatch and was unable to stand self.   In ED, pt hypoxic but VBG not overtly hypercarbic. CK 2000, Cr 2.48. Admitted to [name] [name].  On 4/15, patient with progressively worsening resp status, as well as complex medical picture including rhabdo/AKI s/p fluid restriction due to pulm status, elevated BNP despite fluid restriction and ongoing diuresis.  Transfer to ICU.  She was put on intermittent BiPAP.  Diuresed with IV Lasix with improvement in her breathing.  She was transferred back to [name] [name] on 4/20.  Assessment & Plan:   Active Problems:   LFT elevation   AMS (altered mental status)   Non-traumatic rhabdomyolysis   Acute on chronic respiratory failure with hypoxia and hypercapnia (HCC)   Non-pressure chronic ulcer of other part of right foot limited to breakdown of skin ([name] [name])   Fall   Moderate protein-calorie malnutrition ([name] [name])   Idiopathic chronic gout without tophus   Acute respiratory failure with hypoxia ([name] [name])   Palliative care by specialist   DNR (do not resuscitate)   Weakness generalized  Acute metabolic encephalopathy:  Multifactorial including respiratory failure although no significant hypercarbia.   TSH 6.63.  Free T4 normal.  B12 5.7K.  No obvious signs of infection.  No focal neuro deficit.  CT head without acute finding.  Ammonia  rising.  More lethargic today as well. -ammonia elevated (rising), continue lactulose (increase freq) -VBG 4/22 with hypercarbia, but no acidosis, continue to monitor - with worsening confusion, will recheck VBG -Frequent orientation and delirium precautions.  Acute on chronic RF with hypoxia and hypercarbia: Multifactorial including OSA/possible OHS, COPD and PFO.  Not sure if she is compliant with CPAP at home.  On 2 L at baseline. -currently on 3 L [name] [name] -Minimum oxygen to maintain saturation above 88% -CPAP prn and hs -Continue Brovana, pulmicort, prn albuterol nebs -CXR with mild central pulm vascular congestion  Acute on chronic diastolic heart failure: Echo on 4/11 with EF of 55 to 60%, no RWMA, indeterminate diastolic dysfunction, moderate LAE, severe RAE moderate TVR, moderate AVR and RVSP of 46.6.  BNP 2000 (higher than baseline).  Still with significant fluid overload clinically.  UOP 2.2 L 4/22. -IV Lasix 60 mg TID daily -continue spironolactone -Midodrine 5 mg 3 times daily for soft blood pressures -Strict intake and output -Sodium restriction -Repeat BNP elevated -Will involve cardiology/renal if no improvement  AKI on CKD-3a/azotemia: Baseline Cr ~1.4> 2.48 (admit)> 2.61 (peak)>> 1.97 today.  Renal ultrasound without significant finding.  She could be [name] [name] combination of prerenal insult in the setting of dehydration and possible pigment nephropathy from rhabdo.  Still with significant fluid overload on exam. -Continue monitoring -continue diuresis with IV lasix -May involve nephrology if no improvement  Hypervolemic hypernatremia: Na 147 (admit)> 156 (peak)> 146 -Diuretics as above. -Continue free  water per p.o.   Fall at home Debility/physical deconditioning Traumatic rhabdomyolysis-resolved. -PT/OT recommended SNF.  Hypoglycemia: Likely due to poor Po p.o. intake. - d/c D10 and follow POC BG's  Paroxysmal [name] [name]. Fib: Now in sinus rhythm without medications.   CHA2DS2-VASc score greater than 3. -Continue Eliquis for anticoagulation for now.  Need to discuss long term risk and benefit given risk of fall.  SSS s/p PPM-stable.  Macrocytic anemia: B12 level high, folate wnl.  H&H stable. - ferritin low normal, overall labs wnl.  Hypoproliferative retics. - will start PO iron  Hypothyroidism: TSH 6.6. -Continue home Synthroid  Thrombocytopenia: Improving. -Continue monitoring  Dysphagia -Appreciate SLP input-dysphagia 2 diet.  Morbid obesity:Body mass index is 45.23 kg/m. -Challenging issue given patient's comorbidity and immobility.  History of gout: Uric acid elevated but patient denies pain.  Could be due to renal failure. - Follow uric acid intermittently  -restart allopurinol, low dose with aki  Mood Disorder: med rec notes she's not taking many of her psych meds.  Currently still has AMS.  Palliative recommending psych c/s, will continue to monitor - consider if mental status improves [name] [name] bit more.  Goal of care discussion: Patient with significant comorbidities and debility as above.  She is [name] [name] still full code.  Per previous provider "I do not believe patient has capacity to make reasonable medical decision.  I discussed with patient's daughter, [name] [name] who states she is the only close family.  [name] [name] is working to  [employer] and guardianship paper.  After discussion about patient's comorbidity, quality of life, CODE STATUS and pros and cons of CPR and intubation, [name] [name] requested for CODE STATUS to be changed to DNR/DNI."  -CODE STATUS changed to DNR/DNI -Palliative care consulted  Nutrition Nutrition Problem: Inadequate oral intake Etiology: poor appetite  Signs/Symptoms: meal completion < 50%  Interventions: Prostat, Hormel Shake  R sided skin breakdown, present on admission-no signs of infection. -wound care, frequent turns.  RIJ central line Discontinue central line, establish PIV (central line still in place today, will  discuss removal with RN)  Foley in place:  Will d/c foley at this time  Elevated LFT's: mild, follow acute hepatitis panel, continue to monitor.  RUQ [place] 4/21 with sludge and stones with nonspecific gallbladder wall thickening (2/2 acute vs chronic cholecystitis, hepatic disease, or edema forming states).  Echogenic liver.  ? Cirrhosis.  DVT prophylaxis: eliquis Code Status: DNR Family Communication: none at bedside, daughter over phone 4/21 Disposition:   Status is: Inpatient  Remains inpatient appropriate because:Altered mental status, IV treatments appropriate due to intensity of illness or inability to take PO, Inpatient level of care appropriate due to severity of illness and continued need for IV diuresis, monitoring of renal function in setting of volume overload   Dispo: The patient is from: Home              Anticipated d/c is to: SNF              Anticipated d/c date is: 3 days              Patient currently is not medically stable to d/c.  Consultants:   none  Procedures:  Echo IMPRESSIONS    1. Limted bubble study Bi atrial enlargement with pacing wire in RA/RV.  Mobile redundant atrial septum with positive bubble study for right to  left shunt fairly large amount of bubbles crossing septum.   Echo IMPRESSIONS    1. Left ventricular ejection fraction, by estimation,  is 55 to 60%. The  left ventricle has normal function. The left ventricle has no regional  wall motion abnormalities. Left ventricular diastolic parameters are  indeterminate.  2. Right ventricular systolic function is normal. The right ventricular  size is normal. There is moderately elevated pulmonary artery systolic  pressure.  3. Left atrial size was moderately dilated.  4. Right atrial size was severely dilated.  5. The mitral valve is grossly normal. Mild to moderate mitral valve  regurgitation.  6. Tricuspid valve regurgitation is moderate.  7. The aortic valve is tricuspid.  Aortic valve regurgitation is moderate.  Mild aortic valve stenosis.   LE [place] Summary:  RIGHT:  - Findings appear essentially unchanged compared to previous examination.  - There is no evidence of deep vein thrombosis in the lower extremity.  However, portions of this examination were limited- see technologist  comments above.    LEFT:  - There is no evidence of deep vein thrombosis in the lower extremity.  However, portions of this examination were limited- see technologist  comments above.   UE [place] Summary:    Right:  No evidence of deep vein thrombosis in the upper extremity. However,  unable to  visualize the axillary, cephalic, and basilic veins.    Left:  No evidence of deep vein thrombosis in the upper extremity. However,  unable to  visualize the cephalic, basilic, and internal jugular veins.    Antimicrobials:  Anti-infectives (From admission, onward)   Start     Dose/Rate Route Frequency Ordered Stop   01/02/20 0600  vancomycin (VANCOREADY) IVPB 1250 mg/250 mL  Status:  Discontinued     1,250 mg 166.7 mL/hr over 90 Minutes Intravenous Every 48 hours 12/31/19 0801 01/01/20 0757   12/31/19 1700  cefTRIAXone (ROCEPHIN) 2 g in sodium chloride 0.9 % 100 mL IVPB  Status:  Discontinued     2 g 200 mL/hr over 30 Minutes Intravenous Every 24 hours 12/31/19 1250 01/01/20 0757   12/31/19 1000  aztreonam (AZACTAM) 1 g in sodium chloride 0.9 % 100 mL IVPB  Status:  Discontinued     1 g 200 mL/hr over 30 Minutes Intravenous Every 8 hours 12/31/19 0801 12/31/19 1250   12/31/19 0900  vancomycin (VANCOREADY) IVPB 2000 mg/400 mL     2,000 mg 200 mL/hr over 120 Minutes Intravenous  Once 12/31/19 0801 12/31/19 1221   12/31/19 0800  metroNIDAZOLE (FLAGYL) IVPB 500 mg  Status:  Discontinued     500 mg 100 mL/hr over 60 Minutes Intravenous Every 8 hours 12/31/19 0753 01/01/20 0757      Subjective: Confused, doesn't say much today  Objective: Vitals:   01/11/20 0430  01/11/20 0825 01/11/20 0900 01/11/20 1138  BP: 122/72     Pulse:      Resp: (!) 23  20   Temp: (!) 97.5 F (36.4 C) 98.1 F (36.7 C)  97.9 F (36.6 C)  TempSrc: Oral Oral  Oral  SpO2: 97%     Weight: 131.6 kg     Height:        Intake/Output Summary (Last 24 hours) at 01/11/2020 1447 Last data filed at 01/11/2020 1353 Gross per 24 hour  Intake 560 ml  Output 2100 ml  Net -1540 ml   Filed Weights   01/09/20 0425 01/10/20 0033 01/11/20 0430  Weight: 132.2 kg 132.1 kg 131.6 kg    Examination:  General: No acute distress. Cardiovascular: Heart sounds show Jonmarc Bodkin regular rate, and rhythm Lungs: Clear to auscultation  bilaterally Abdomen: Soft, nontender, nondistended Neurological: Slightly lethargic and disoriented. Moves all extremities 4 with equal strength. Cranial nerves II through XII grossly intact. Skin: [name] and dry. No rashes or lesions. Extremities: diffuse edema     Data Reviewed: I have personally reviewed following labs and imaging studies  CBC: Recent Labs  Lab 01/07/20 0301 01/08/20 0423 01/09/20 0438 01/10/20 0423 01/11/20 0719  WBC 4.9 5.3 5.4 5.6 5.8  HGB 10.1* 9.9* 9.4* 9.2* 9.5*  HCT 35.3* 34.3* 33.3* 31.7* 32.9*  MCV 104.7* 104.9* 105.0* 102.6* 103.1*  PLT 122* 133* 153 178 270    Basic Metabolic Panel: Recent Labs  Lab 01/07/20 0301 01/08/20 0423 01/09/20 0438 01/10/20 0423 01/11/20 0719  NA 146* 147* 146* 145 146*  K 4.1 4.3 4.3 4.4 4.4  CL 108 108 107 106 106  CO2 29 31 29 30  32  GLUCOSE 97 97 125* 88 103*  BUN 78* 85* 95* 91* 91*  CREATININE 2.08* 2.15* 2.17* 2.08* 1.97*  CALCIUM 9.4 9.3 9.5 9.6 9.6  MG  --   --  2.3 2.3 2.3  PHOS  --   --  4.1 3.7  --     GFR: Estimated Creatinine Clearance: 36 mL/min (Camira Geidel) (by C-G formula based on SCr of 1.97 mg/dL (H)).  Liver Function Tests: Recent Labs  Lab 01/06/20 0434 01/06/20 0434 01/07/20 0301 01/09/20 0438 01/09/20 0726 01/10/20 0423 01/11/20 0719  AST 29  --  28  --  32  78* 69*  ALT 25  --  26  --  30 49* 60*  ALKPHOS 177*  --  173*  --  208* 273* 305*  BILITOT 1.1  --  0.9  --  0.7 0.8 1.1  PROT 5.9*  --  5.9*  --  6.0* 6.1* 6.2*  ALBUMIN 2.6*   < > 2.6* 2.6* 2.6* 2.6*  2.6* 2.7*   < > = values in this interval not displayed.    CBG: Recent Labs  Lab 01/10/20 1958 01/11/20 0009 01/11/20 0431 01/11/20 0823 01/11/20 1135  GLUCAP 121* 121* 100* 92 104*     Recent Results (from the past 240 hour(s))  MRSA PCR Screening     Status: Abnormal   Collection Time: 01/03/20  5:46 PM   Specimen: Nasal Mucosa; Nasopharyngeal  Result Value Ref Range Status   MRSA by PCR POSITIVE (Ellanie Oppedisano) NEGATIVE Final    Comment:        The GeneXpert MRSA Assay (FDA approved for NASAL specimens only), is one component of Adriana Quinby comprehensive MRSA colonization surveillance program. It is not intended to diagnose MRSA infection nor to guide or monitor treatment for MRSA infections. RESULT CALLED TO, READ BACK BY AND VERIFIED WITH: Garfield Cornea RN 01/03/20 2013 JDW Performed at [facility], [place] [address], [place], [place] [address]   Respiratory Panel by RT PCR (Flu Aubry Rankin&B, Covid) - Nasopharyngeal Swab     Status: None   Collection Time: 01/09/20  2:41 PM   Specimen: Nasopharyngeal Swab  Result Value Ref Range Status   SARS Coronavirus 2 by RT PCR NEGATIVE NEGATIVE Final    Comment: (NOTE) SARS-CoV-2 target nucleic acids are NOT DETECTED. The SARS-CoV-2 RNA is generally detectable in upper respiratoy specimens during the acute phase of infection. The lowest concentration of SARS-CoV-2 viral copies this assay can detect is 131 copies/mL. Hiroshi Krummel negative result does not preclude SARS-Cov-2 infection and should not be used as the sole basis for treatment or other patient management decisions.  Duy Lemming negative result may occur with  improper specimen collection/handling, submission of specimen other than nasopharyngeal swab, presence of viral mutation(s) within the areas targeted  by this assay, and inadequate number of viral copies (<131 copies/mL). Dantrell Schertzer negative result must be combined with clinical observations, patient history, and epidemiological information. The expected result is Negative. Fact Sheet for Patients:  PinkCheek.be Fact Sheet for Healthcare Providers:  GravelBags.it This test is not yet ap proved or cleared by the [place] FDA and  has been authorized for detection and/or diagnosis of SARS-CoV-2 by FDA under an Emergency Use Authorization (EUA). This EUA will remain  in effect (meaning this test can be used) for the duration of the COVID-19 declaration under Section 564(b)(1) of the Act, 21 U.S.C. section 360bbb-3(b)(1), unless the authorization is terminated or revoked sooner.    Influenza Emmanuel Gruenhagen by PCR NEGATIVE NEGATIVE Final   Influenza B by PCR NEGATIVE NEGATIVE Final    Comment: (NOTE) The Xpert Xpress SARS-CoV-2/FLU/RSV assay is intended as an aid in  the diagnosis of influenza from Nasopharyngeal swab specimens and  should not be used as Isa Kohlenberg sole basis for treatment. Nasal washings and  aspirates are unacceptable for Xpert Xpress SARS-CoV-2/FLU/RSV  testing. Fact Sheet for Patients: PinkCheek.be Fact Sheet for Healthcare Providers: GravelBags.it This test is not yet approved or cleared by the [place] FDA and  has been authorized for detection and/or diagnosis of SARS-CoV-2 by  FDA under an Emergency Use Authorization (EUA). This EUA will remain  in effect (meaning this test can be used) for the duration of the  Covid-19 declaration under Section 564(b)(1) of the Act, 21  U.S.C. section 360bbb-3(b)(1), unless the authorization is  terminated or revoked. Performed at [facility], [place] [name] [place], [place] [address]          Radiology Studies: No results found.      Scheduled Meds: .  allopurinol  50 mg Oral Daily  . apixaban  5 mg Oral BID  . arformoterol  15 mcg Nebulization BID  . budesonide (PULMICORT) nebulizer solution  0.25 mg Nebulization BID  . Chlorhexidine Gluconate Cloth  6 each Topical Daily  . famotidine  20 mg Oral Daily  . feeding supplement (ENSURE ENLIVE)  237 mL Oral TID BM  . ferrous sulfate  325 mg Oral Q breakfast  . folic acid  1 mg Oral Daily  . free water  200 mL Oral Q4H  . furosemide  60 mg Intravenous TID  . lactulose  20 g Oral TID  . levothyroxine  200 mcg Oral Q0600  . midodrine  5 mg Oral TID WC  . sodium chloride flush  10-40 mL Intracatheter Q12H  . spironolactone  25 mg Oral Daily  . thiamine  100 mg Oral Daily  . vitamin B-12  1,000 mcg Oral Daily   Continuous Infusions:    LOS: 13 days    Time spent: over 30 min    Fayrene Helper, MD [facility]   To contact the attending provider between 7A-7P or the covering provider during after hours 7P-7A, please log into the web site [email or web address] and access using universal [place] password for that web site. If you do not have the password, please call the hospital operator.  01/11/2020, 2:47 PM

## 2020-01-11 NOTE — Progress Notes (Signed)
  Speech Language Pathology Treatment: Dysphagia  Patient Details Name: [name] [name] MRN: [id number] DOB: 08-25-1946 Today's Date: 01/11/2020 Time: [id number] SLP Time Calculation (min) (ACUTE ONLY): 9 min  Assessment / Plan / Recommendation Clinical Impression  Pt was seen for dysphagia treatment. She was lethargic and demonstrated difficulty maintaining alertness for extended periods of time. Ryan, RN reported that this has been the pt's status this morning. No signs of aspiration were noted with puree or thin liquids via straw. However, bolus manipulation was prolonged and verbal prompts were intermittently needed to initiate bolus manipulation with puree. Pt's level of alertness continued to wane as the session progressed and more advanced solids were deferred for this reason. A diet upgrade is not clinically indicated at this time but SLP will continue to follow pt.    HPI HPI: [name] [name] is a 74 y.o. female with medical history significant of COPD chronically on 2 L oxygen, chronic diastolic CHF, sleep apnea, paroxysmal A. fib, chronic anemia, CKD stage III with baseline creatinine of 1.3, sick sinus syndrome status post PPM, hypothyroidism presented with Fall.  Patient lives by herself, and she is morbidly obese and had a fall episode about 6 days ago.  Patient was not able to stand up herself and she stayed on lying for the last 6 days.  Head CT was negative for acute findings.        SLP Plan  Continue with current plan of care       Recommendations  Diet recommendations: Dysphagia 2 (fine chop);Thin liquid Liquids provided via: Cup;Straw Medication Administration: Whole meds with puree Supervision: Patient able to self feed;Full supervision/cueing for compensatory strategies;Staff to assist with self feeding Compensations: Slow rate;Small sips/bites Postural Changes and/or Swallow Maneuvers: Seated upright 90 degrees                Oral Care Recommendations: Oral care  BID Follow up Recommendations: Skilled Nursing facility SLP Visit Diagnosis: Dysphagia, unspecified (R13.10) Plan: Continue with current plan of care       Reveca Desmarais I. Hardin Negus, [place], [place] Office number [phone] Pager [place] 01/11/2020, 12:29 PM     .

## 2020-01-12 ENCOUNTER — Inpatient Hospital Stay (HOSPITAL_COMMUNITY): Payer: Medicare HMO

## 2020-01-12 DIAGNOSIS — K7290 Hepatic failure, unspecified without coma: Secondary | ICD-10-CM | POA: Diagnosis not present

## 2020-01-12 DIAGNOSIS — K7682 Hepatic encephalopathy: Secondary | ICD-10-CM

## 2020-01-12 LAB — CBC WITH DIFFERENTIAL/PLATELET
Abs Immature Granulocytes: 0.04 10*3/uL (ref 0.00–0.07)
Basophils Absolute: 0 10*3/uL (ref 0.0–0.1)
Basophils Relative: 0 %
Eosinophils Absolute: 0.1 10*3/uL (ref 0.0–0.5)
Eosinophils Relative: 2 %
HCT: 32.4 % — ABNORMAL LOW (ref 36.0–46.0)
Hemoglobin: 9.2 g/dL — ABNORMAL LOW (ref 12.0–15.0)
Immature Granulocytes: 1 %
Lymphocytes Relative: 11 %
Lymphs Abs: 0.6 10*3/uL — ABNORMAL LOW (ref 0.7–4.0)
MCH: 30.1 pg (ref 26.0–34.0)
MCHC: 28.4 g/dL — ABNORMAL LOW (ref 30.0–36.0)
MCV: 105.9 fL — ABNORMAL HIGH (ref 80.0–100.0)
Monocytes Absolute: 0.5 10*3/uL (ref 0.1–1.0)
Monocytes Relative: 10 %
Neutro Abs: 4 10*3/uL (ref 1.7–7.7)
Neutrophils Relative %: 76 %
Platelets: 224 10*3/uL (ref 150–400)
RBC: 3.06 MIL/uL — ABNORMAL LOW (ref 3.87–5.11)
RDW: 18.9 % — ABNORMAL HIGH (ref 11.5–15.5)
WBC: 5.3 10*3/uL (ref 4.0–10.5)
nRBC: 2.1 % — ABNORMAL HIGH (ref 0.0–0.2)

## 2020-01-12 LAB — GLUCOSE, CAPILLARY
Glucose-Capillary: 114 mg/dL — ABNORMAL HIGH (ref 70–99)
Glucose-Capillary: 38 mg/dL — CL (ref 70–99)
Glucose-Capillary: 60 mg/dL — ABNORMAL LOW (ref 70–99)
Glucose-Capillary: 77 mg/dL (ref 70–99)
Glucose-Capillary: 79 mg/dL (ref 70–99)
Glucose-Capillary: 82 mg/dL (ref 70–99)
Glucose-Capillary: 84 mg/dL (ref 70–99)
Glucose-Capillary: 94 mg/dL (ref 70–99)

## 2020-01-12 LAB — COMPREHENSIVE METABOLIC PANEL
ALT: 59 U/L — ABNORMAL HIGH (ref 0–44)
AST: 59 U/L — ABNORMAL HIGH (ref 15–41)
Albumin: 2.7 g/dL — ABNORMAL LOW (ref 3.5–5.0)
Alkaline Phosphatase: 323 U/L — ABNORMAL HIGH (ref 38–126)
Anion gap: 10 (ref 5–15)
BUN: 94 mg/dL — ABNORMAL HIGH (ref 8–23)
CO2: 31 mmol/L (ref 22–32)
Calcium: 9.6 mg/dL (ref 8.9–10.3)
Chloride: 105 mmol/L (ref 98–111)
Creatinine, Ser: 2.08 mg/dL — ABNORMAL HIGH (ref 0.44–1.00)
GFR calc Af Amer: 27 mL/min — ABNORMAL LOW (ref >60)
GFR calc non Af Amer: 23 mL/min — ABNORMAL LOW (ref >60)
Glucose, Bld: 95 mg/dL (ref 70–99)
Potassium: 4.4 mmol/L (ref 3.5–5.1)
Sodium: 146 mmol/L — ABNORMAL HIGH (ref 135–145)
Total Bilirubin: 0.8 mg/dL (ref 0.3–1.2)
Total Protein: 6.4 g/dL — ABNORMAL LOW (ref 6.5–8.1)

## 2020-01-12 LAB — BLOOD GAS, VENOUS
Acid-Base Excess: 4.5 mmol/L — ABNORMAL HIGH (ref 0.0–2.0)
Bicarbonate: 31.4 mmol/L — ABNORMAL HIGH (ref 20.0–28.0)
FIO2: 94
O2 Saturation: 76.2 %
Patient temperature: 37
pCO2, Ven: 76 mmHg (ref 44.0–60.0)
pH, Ven: 7.24 — ABNORMAL LOW (ref 7.250–7.430)

## 2020-01-12 LAB — BLOOD GAS, ARTERIAL
Acid-Base Excess: 4.6 mmol/L — ABNORMAL HIGH (ref 0.0–2.0)
Bicarbonate: 31.4 mmol/L — ABNORMAL HIGH (ref 20.0–28.0)
FIO2: 40
O2 Saturation: 92 %
Patient temperature: 37
pCO2 arterial: 74.6 mmHg (ref 32.0–48.0)
pH, Arterial: 7.248 — ABNORMAL LOW (ref 7.350–7.450)
pO2, Arterial: 69.6 mmHg — ABNORMAL LOW (ref 83.0–108.0)

## 2020-01-12 LAB — URIC ACID: Uric Acid, Serum: 13.9 mg/dL — ABNORMAL HIGH (ref 2.5–7.1)

## 2020-01-12 LAB — AMMONIA: Ammonia: 152 umol/L — ABNORMAL HIGH (ref 9–35)

## 2020-01-12 LAB — PHOSPHORUS: Phosphorus: 4.7 mg/dL — ABNORMAL HIGH (ref 2.5–4.6)

## 2020-01-12 LAB — MAGNESIUM: Magnesium: 2.4 mg/dL (ref 1.7–2.4)

## 2020-01-12 MED ORDER — MIDODRINE HCL 5 MG PO TABS
10.0000 mg | ORAL_TABLET | Freq: Three times a day (TID) | ORAL | Status: DC
  Administered 2020-01-13 – 2020-01-16 (×7): 10 mg via ORAL
  Filled 2020-01-12 (×7): qty 2

## 2020-01-12 MED ORDER — GLYCOPYRROLATE 0.2 MG/ML IJ SOLN
0.1000 mg | Freq: Four times a day (QID) | INTRAMUSCULAR | Status: DC | PRN
  Administered 2020-01-17: 0.1 mg via INTRAVENOUS
  Filled 2020-01-12: qty 1
  Filled 2020-01-12: qty 0.5

## 2020-01-12 MED ORDER — FUROSEMIDE 10 MG/ML IJ SOLN
60.0000 mg | Freq: Three times a day (TID) | INTRAMUSCULAR | Status: DC
  Administered 2020-01-12: 60 mg via INTRAVENOUS
  Filled 2020-01-12: qty 6

## 2020-01-12 MED ORDER — LACTULOSE ENEMA
300.0000 mL | Freq: Four times a day (QID) | ORAL | Status: DC
  Administered 2020-01-12: 300 mL via RECTAL
  Filled 2020-01-12 (×3): qty 300

## 2020-01-12 MED ORDER — DEXTROSE-NACL 5-0.45 % IV SOLN: INTRAVENOUS | Status: DC

## 2020-01-12 MED ORDER — ALBUMIN HUMAN 25 % IV SOLN
25.0000 g | Freq: Once | INTRAVENOUS | Status: AC
  Administered 2020-01-12: 25 g via INTRAVENOUS
  Filled 2020-01-12: qty 100

## 2020-01-12 MED ORDER — FUROSEMIDE 10 MG/ML IJ SOLN: 80.0000 mg | Freq: Three times a day (TID) | INTRAMUSCULAR | Status: DC

## 2020-01-12 MED ORDER — OCTREOTIDE ACETATE 500 MCG/ML IJ SOLN
200.0000 ug | Freq: Two times a day (BID) | INTRAMUSCULAR | Status: AC
  Administered 2020-01-13 – 2020-01-15 (×4): 200 ug via SUBCUTANEOUS
  Filled 2020-01-12 (×6): qty 0.4

## 2020-01-12 MED ORDER — GLYCOPYRROLATE 0.2 MG/ML IJ SOLN
0.1000 mg | Freq: Once | INTRAMUSCULAR | Status: AC
  Administered 2020-01-12: 0.1 mg via INTRAVENOUS
  Filled 2020-01-12: qty 0.5

## 2020-01-12 MED ORDER — SODIUM CHLORIDE 0.45 % IV SOLN: INTRAVENOUS | Status: DC

## 2020-01-12 MED ORDER — ALBUMIN HUMAN 25 % IV SOLN
25.0000 g | Freq: Three times a day (TID) | INTRAVENOUS | Status: DC
  Administered 2020-01-12 – 2020-01-16 (×11): 25 g via INTRAVENOUS
  Filled 2020-01-12 (×13): qty 100

## 2020-01-12 MED ORDER — LACTULOSE 10 GM/15ML PO SOLN
30.0000 g | Freq: Four times a day (QID) | ORAL | Status: DC
  Administered 2020-01-12 – 2020-01-15 (×8): 30 g via ORAL
  Filled 2020-01-12 (×10): qty 45

## 2020-01-12 MED ORDER — SODIUM CHLORIDE 0.9 % IV SOLN
2.0000 g | INTRAVENOUS | Status: DC
  Administered 2020-01-12 – 2020-01-15 (×4): 2 g via INTRAVENOUS
  Filled 2020-01-12 (×2): qty 20
  Filled 2020-01-12: qty 2
  Filled 2020-01-12: qty 20
  Filled 2020-01-12: qty 2
  Filled 2020-01-12: qty 20

## 2020-01-12 MED ORDER — SODIUM CHLORIDE 0.9 % IV SOLN
INTRAVENOUS | Status: DC | PRN
  Administered 2020-01-12 – 2020-01-13 (×2): 250 mL via INTRAVENOUS

## 2020-01-12 MED ORDER — RIFAXIMIN 550 MG PO TABS
550.0000 mg | ORAL_TABLET | Freq: Two times a day (BID) | ORAL | Status: DC
  Administered 2020-01-12 – 2020-01-16 (×6): 550 mg via ORAL
  Filled 2020-01-12 (×10): qty 1

## 2020-01-12 NOTE — Progress Notes (Addendum)
CRITICAL VALUE ALERT  Critical Value:  PCO2= 74.6  Date & Time Notied:  01/12/20 1:06 PM  Provider Notified: Dr. Florene Glen  Orders Received/Actions taken: page sent. Awaiting call back  2:33 PM Patient placed on continuous bipap per MD orders by RT at 1340

## 2020-01-12 NOTE — Progress Notes (Signed)
Discussed with attending MD about patients LOC. Waiting on labs to assess ABG. Will hold PO meds while patient is excessively lethargic.

## 2020-01-12 NOTE — Progress Notes (Signed)
Placed patient on BiPAP 22/10 40% per MD's order.

## 2020-01-12 NOTE — Progress Notes (Addendum)
PROGRESS NOTE    [name] [name]  ZLD:[id number] DOB: 09/28/1945 DOA: 01/16/2020 PCP: Biagio Borg, MD   Chief Complaint  Patient presents with  . Fall    Brief Narrative:  74 yo F PMH OSA, COPD on 2LNC, diastolic CHF, Afib, CKD III, SSS s/p PPM, PFO, hypothyroidism, depression, breast cancer s/p chemo and morbid obesity who presented to ED 4/10 with AMS after being found down at home. EMS was dispatched to patient's home after neighbors noticed mail was full. Upon EMS arrival, pt was hypoxic SpO2 80% on RA and had pressure ulcers on R side of body, and endorsed global myalgia. Patient reported that she sustained fall approx 6 days prior to EMS dispatch and was unable to stand self.   In ED, pt hypoxic but VBG not overtly hypercarbic. CK 2000, Cr 2.48. Admitted to [facility].  On 4/15, patient with progressively worsening resp status, as well as complex medical picture including rhabdo/AKI s/p fluid restriction due to pulm status, elevated BNP despite fluid restriction and ongoing diuresis.  Transfer to ICU.  She was put on intermittent BiPAP.  Diuresed with IV Lasix with improvement in her breathing.  She was transferred back to [facility] on 4/20.  Assessment & Plan:   Active Problems:   LFT elevation   AMS (altered mental status)   Non-traumatic rhabdomyolysis   Acute on chronic respiratory failure with hypoxia and hypercapnia (HCC)   Non-pressure chronic ulcer of other part of right foot limited to breakdown of skin ([place])   Fall   Moderate protein-calorie malnutrition ([place])   Idiopathic chronic gout without tophus   Acute respiratory failure with hypoxia ([place])   Palliative care by specialist   DNR (do not resuscitate)   Weakness generalized  Acute metabolic encephalopathy:  Multifactorial including respiratory failure.   TSH 6.63.  Free T4 normal.  B12 5.7K.  No obvious signs of infection.  No focal neuro deficit.  CT head without acute finding.  Ammonia rising, pending today.  Hypercarbia  with acidemia.  She continues to be significantly lethargic today.  Seems like her encephalopathy is related to hypercarbia and hepatic encephalopathy at this point. - was on bipap overnight -> follow repeat VBG - ammonia elevated (rising 4/23), continue lactulose (increased freq) - addendum: ammonia worsening to 150's -> lactulose enemas ordered, will c/s GI -VBG 4/22 with hypercarbia, but no acidosis, continue to monitor -Frequent orientation and delirium precautions.  Acute on chronic RF with hypoxia and hypercarbia: Multifactorial including OSA/possible OHS, COPD and PFO.  Not sure if she is compliant with CPAP at home.  On 2 L at baseline. -currently on 3 L [place] -Minimum oxygen to maintain saturation above 88% -needs bipap nightly -Continue Brovana, pulmicort, prn albuterol nebs -CXR with mild central pulm vascular congestion -repeat CXR today  Acute on chronic diastolic heart failure: Echo on 4/11 with EF of 55 to 60%, no RWMA, indeterminate diastolic dysfunction, moderate LAE, severe RAE moderate TVR, moderate AVR and RVSP of 46.6.  BNP 2000 (higher than baseline).  Still with significant fluid overload clinically.  UOP 1 L 4/23 (foley now out).  Weight down. -IV Lasix 60 mg TID daily  -continue spironolactone -Midodrine 5 mg 3 times daily for soft blood pressures -Strict intake and output -Sodium restriction -Repeat BNP elevated -Will involve cardiology/renal if no improvement   AKI on CKD-3a/azotemia: Baseline Cr ~1.4> 2.48 (admit)> 2.61 (peak)>> 2.08 today, fluctuating with diuresis.  Renal ultrasound without significant finding.  She could be Brighten Buzzelli combination  of prerenal insult in the setting of dehydration and possible pigment nephropathy from rhabdo.  Still with significant fluid overload on exam. -Continue monitoring -continue diuresis with IV lasix -May involve nephrology if no improvement - addendum - renal has been consulted  Hypervolemic hypernatremia: Na 147 (admit)> 156  (peak)> 146 -Diuretics as above. -Continue free water per p.o.   Fall at home Debility/physical deconditioning Traumatic rhabdomyolysis-resolved. -PT/OT recommended SNF.  Hypoglycemia: Likely due to poor Po p.o. intake. - d/c D10 and follow POC BG's  Paroxysmal Daryana Whirley. Fib: Now in sinus rhythm without medications.  CHA2DS2-VASc score greater than 3. -Continue Eliquis for anticoagulation for now.  Need to discuss long term risk and benefit given risk of fall.  SSS s/p PPM-stable.  Macrocytic anemia: B12 level high, folate wnl.  H&H stable. - ferritin low normal, overall labs wnl.  Hypoproliferative retics. - will start PO iron  Hypothyroidism: TSH 6.6. -Continue home Synthroid  Thrombocytopenia: Improving. -Continue monitoring  Dysphagia -Appreciate SLP input-dysphagia 2 diet.  Morbid obesity:Body mass index is 45.23 kg/m. -Challenging issue given patient's comorbidity and immobility.  History of gout: Uric acid elevated but patient denies pain.  Could be due to renal failure. - Follow uric acid intermittently  -restart allopurinol, low dose with aki  Mood Disorder: med rec notes she's not taking many of her psych meds.  Currently still has AMS.  Palliative recommending psych c/s, will continue to monitor - consider if mental status improves Yeiden Frenkel bit more.  Goal of care discussion: Patient with significant comorbidities and debility as above.  She is Abdel Effinger still full code.  Per previous provider "I do not believe patient has capacity to make reasonable medical decision.  I discussed with patient's daughter, [name] who states she is the only close family.  [name] is working to  [employer] and guardianship paper.  After discussion about patient's comorbidity, quality of life, CODE STATUS and pros and cons of CPR and intubation, [name] requested for CODE STATUS to be changed to DNR/DNI."  -CODE STATUS changed to DNR/DNI -Palliative care consulted  Nutrition Nutrition Problem:  Inadequate oral intake Etiology: poor appetite  Signs/Symptoms: meal completion < 50%  Interventions: Prostat, Hormel Shake  R sided skin breakdown, present on admission-no signs of infection. -wound care, frequent turns.  RIJ central line Discontinue central line, establish PIV (central line still in place today, will discuss removal with RN)  Foley in place:  Will d/c foley at this time  Elevated LFT's: mild, follow acute hepatitis panel, continue to monitor.  RUQ [place] 4/21 with sludge and stones with nonspecific gallbladder wall thickening (2/2 acute vs chronic cholecystitis, hepatic disease, or edema forming states).  Echogenic liver.  ? Cirrhosis. Follow inr.  Normal bili, platelets.  Albumin 2.7.  DVT prophylaxis: eliquis Code Status: DNR Family Communication: none at bedside, daughter over phone 4/21 Disposition:   Status is: Inpatient  Remains inpatient appropriate because:Altered mental status, IV treatments appropriate due to intensity of illness or inability to take PO, Inpatient level of care appropriate due to severity of illness and continued need for IV diuresis, monitoring of renal function in setting of volume overload   Dispo: The patient is from: Home              Anticipated d/c is to: SNF              Anticipated d/c date is: 3 days              Patient currently is not  medically stable to d/c.  Consultants:   none  Procedures:  Echo IMPRESSIONS    1. Limted bubble study Bi atrial enlargement with pacing wire in RA/RV.  Mobile redundant atrial septum with positive bubble study for right to  left shunt fairly large amount of bubbles crossing septum.   Echo IMPRESSIONS    1. Left ventricular ejection fraction, by estimation, is 55 to 60%. The  left ventricle has normal function. The left ventricle has no regional  wall motion abnormalities. Left ventricular diastolic parameters are  indeterminate.  2. Right ventricular systolic function is  normal. The right ventricular  size is normal. There is moderately elevated pulmonary artery systolic  pressure.  3. Left atrial size was moderately dilated.  4. Right atrial size was severely dilated.  5. The mitral valve is grossly normal. Mild to moderate mitral valve  regurgitation.  6. Tricuspid valve regurgitation is moderate.  7. The aortic valve is tricuspid. Aortic valve regurgitation is moderate.  Mild aortic valve stenosis.   LE [place] Summary:  RIGHT:  - Findings appear essentially unchanged compared to previous examination.  - There is no evidence of deep vein thrombosis in the lower extremity.  However, portions of this examination were limited- see technologist  comments above.    LEFT:  - There is no evidence of deep vein thrombosis in the lower extremity.  However, portions of this examination were limited- see technologist  comments above.   UE [place] Summary:    Right:  No evidence of deep vein thrombosis in the upper extremity. However,  unable to  visualize the axillary, cephalic, and basilic veins.    Left:  No evidence of deep vein thrombosis in the upper extremity. However,  unable to  visualize the cephalic, basilic, and internal jugular veins.    Antimicrobials:  Anti-infectives (From admission, onward)   Start     Dose/Rate Route Frequency Ordered Stop   01/02/20 0600  vancomycin (VANCOREADY) IVPB 1250 mg/250 mL  Status:  Discontinued     1,250 mg 166.7 mL/hr over 90 Minutes Intravenous Every 48 hours 12/31/19 0801 01/01/20 0757   12/31/19 1700  cefTRIAXone (ROCEPHIN) 2 g in sodium chloride 0.9 % 100 mL IVPB  Status:  Discontinued     2 g 200 mL/hr over 30 Minutes Intravenous Every 24 hours 12/31/19 1250 01/01/20 0757   12/31/19 1000  aztreonam (AZACTAM) 1 g in sodium chloride 0.9 % 100 mL IVPB  Status:  Discontinued     1 g 200 mL/hr over 30 Minutes Intravenous Every 8 hours 12/31/19 0801 12/31/19 1250   12/31/19 0900  vancomycin  (VANCOREADY) IVPB 2000 mg/400 mL     2,000 mg 200 mL/hr over 120 Minutes Intravenous  Once 12/31/19 0801 12/31/19 1221   12/31/19 0800  metroNIDAZOLE (FLAGYL) IVPB 500 mg  Status:  Discontinued     500 mg 100 mL/hr over 60 Minutes Intravenous Every 8 hours 12/31/19 0753 01/01/20 0757      Subjective: Lethargic, awakens to loud stimuli and irritative stimuli, but does not say anything.  Objective: Vitals:   01/12/20 0400 01/12/20 0810 01/12/20 0813 01/12/20 0851  BP: 107/69   102/70  Pulse:    86  Resp: (!) 22   17  Temp: (!) 97.4 F (36.3 C)   97.8 F (36.6 C)  TempSrc:    Oral  SpO2: 96% 94% 94% 91%  Weight:      Height:        Intake/Output Summary (Last  24 hours) at 01/12/2020 0936 Last data filed at 01/12/2020 0418 Gross per 24 hour  Intake 1000 ml  Output 1000 ml  Net 0 ml   Filed Weights   01/10/20 0033 01/11/20 0430 01/12/20 0004  Weight: 132.1 kg 131.6 kg 128.9 kg    Examination:  General: No acute distress. Cardiovascular: Heart sounds show Cloris Flippo regular rate, and rhythm Lungs: Clear to auscultation bilaterally Abdomen: Soft, nontender, nondistended  Neurological: Lethargic, opens eyes to sternal rub, attends to verbal stimuli, but does not speak, moans. Moves all extremities 4. Cranial nerves II through XII grossly intact. Skin: Warm and dry. No rashes or lesions. Extremities: diffuse upper/lower extremity edema      Data Reviewed: I have personally reviewed following labs and imaging studies  CBC: Recent Labs  Lab 01/08/20 0423 01/09/20 0438 01/10/20 0423 01/11/20 0719 01/12/20 0620  WBC 5.3 5.4 5.6 5.8 5.3  NEUTROABS  --   --   --   --  4.0  HGB 9.9* 9.4* 9.2* 9.5* 9.2*  HCT 34.3* 33.3* 31.7* 32.9* 32.4*  MCV 104.9* 105.0* 102.6* 103.1* 105.9*  PLT 133* 153 178 215 071    Basic Metabolic Panel: Recent Labs  Lab 01/08/20 0423 01/09/20 0438 01/10/20 0423 01/11/20 0719 01/12/20 0620  NA 147* 146* 145 146* 146*  K 4.3 4.3 4.4 4.4 4.4   CL 108 107 106 106 105  CO2 31 29 30  32 31  GLUCOSE 97 125* 88 103* 95  BUN 85* 95* 91* 91* 94*  CREATININE 2.15* 2.17* 2.08* 1.97* 2.08*  CALCIUM 9.3 9.5 9.6 9.6 9.6  MG  --  2.3 2.3 2.3 2.4  PHOS  --  4.1 3.7  --  4.7*    GFR: Estimated Creatinine Clearance: 33.7 mL/min (August Longest) (by C-G formula based on SCr of 2.08 mg/dL (H)).  Liver Function Tests: Recent Labs  Lab 01/07/20 0301 01/07/20 0301 01/09/20 0438 01/09/20 0726 01/10/20 0423 01/11/20 0719 01/12/20 0620  AST 28  --   --  32 78* 69* 59*  ALT 26  --   --  30 49* 60* 59*  ALKPHOS 173*  --   --  208* 273* 305* 323*  BILITOT 0.9  --   --  0.7 0.8 1.1 0.8  PROT 5.9*  --   --  6.0* 6.1* 6.2* 6.4*  ALBUMIN 2.6*   < > 2.6* 2.6* 2.6*  2.6* 2.7* 2.7*   < > = values in this interval not displayed.    CBG: Recent Labs  Lab 01/11/20 1639 01/11/20 2003 01/12/20 0005 01/12/20 0414 01/12/20 0752  GLUCAP 83 104* 94 79 114*     Recent Results (from the past 240 hour(s))  MRSA PCR Screening     Status: Abnormal   Collection Time: 01/03/20  5:46 PM   Specimen: Nasal Mucosa; Nasopharyngeal  Result Value Ref Range Status   MRSA by PCR POSITIVE (Makhari Dovidio) NEGATIVE Final    Comment:        The GeneXpert MRSA Assay (FDA approved for NASAL specimens only), is one component of Dorris Pierre comprehensive MRSA colonization surveillance program. It is not intended to diagnose MRSA infection nor to guide or monitor treatment for MRSA infections. RESULT CALLED TO, READ BACK BY AND VERIFIED WITH: Garfield Cornea RN 01/03/20 2013 JDW Performed at [facility], [place] [address] [place], [place] [address]   Respiratory Panel by RT PCR (Flu Tzirel Leonor&B, Covid) - Nasopharyngeal Swab     Status: None   Collection Time:  01/09/20  2:41 PM   Specimen: Nasopharyngeal Swab  Result Value Ref Range Status   SARS Coronavirus 2 by RT PCR NEGATIVE NEGATIVE Final    Comment: (NOTE) SARS-CoV-2 target nucleic acids are NOT DETECTED. The SARS-CoV-2 RNA is generally  detectable in upper respiratoy specimens during the acute phase of infection. The lowest concentration of SARS-CoV-2 viral copies this assay can detect is 131 copies/mL. Quinterious Walraven negative result does not preclude SARS-Cov-2 infection and should not be used as the sole basis for treatment or other patient management decisions. Nargis Abrams negative result may occur with  improper specimen collection/handling, submission of specimen other than nasopharyngeal swab, presence of viral mutation(s) within the areas targeted by this assay, and inadequate number of viral copies (<131 copies/mL). Kadience Macchi negative result must be combined with clinical observations, patient history, and epidemiological information. The expected result is Negative. Fact Sheet for Patients:  PinkCheek.be Fact Sheet for Healthcare Providers:  GravelBags.it This test is not yet ap proved or cleared by the [place] FDA and  has been authorized for detection and/or diagnosis of SARS-CoV-2 by FDA under an Emergency Use Authorization (EUA). This EUA will remain  in effect (meaning this test can be used) for the duration of the COVID-19 declaration under Section 564(b)(1) of the Act, 21 U.S.C. section 360bbb-3(b)(1), unless the authorization is terminated or revoked sooner.    Influenza Sheriann Newmann by PCR NEGATIVE NEGATIVE Final   Influenza B by PCR NEGATIVE NEGATIVE Final    Comment: (NOTE) The Xpert Xpress SARS-CoV-2/FLU/RSV assay is intended as an aid in  the diagnosis of influenza from Nasopharyngeal swab specimens and  should not be used as Tasneem Cormier sole basis for treatment. Nasal washings and  aspirates are unacceptable for Xpert Xpress SARS-CoV-2/FLU/RSV  testing. Fact Sheet for Patients: PinkCheek.be Fact Sheet for Healthcare Providers: GravelBags.it This test is not yet approved or cleared by the [place] FDA and  has been  authorized for detection and/or diagnosis of SARS-CoV-2 by  FDA under an Emergency Use Authorization (EUA). This EUA will remain  in effect (meaning this test can be used) for the duration of the  Covid-19 declaration under Section 564(b)(1) of the Act, 21  U.S.C. section 360bbb-3(b)(1), unless the authorization is  terminated or revoked. Performed at [facility], [place] [address], [place], [place] [address]          Radiology Studies: No results found.      Scheduled Meds: . allopurinol  50 mg Oral Daily  . apixaban  5 mg Oral BID  . arformoterol  15 mcg Nebulization BID  . budesonide (PULMICORT) nebulizer solution  0.25 mg Nebulization BID  . Chlorhexidine Gluconate Cloth  6 each Topical Daily  . famotidine  20 mg Oral Daily  . feeding supplement (ENSURE ENLIVE)  237 mL Oral TID BM  . ferrous sulfate  325 mg Oral Q breakfast  . folic acid  1 mg Oral Daily  . free water  200 mL Oral Q4H  . furosemide  60 mg Intravenous TID  . lactulose  20 g Oral QID  . levothyroxine  200 mcg Oral Q0600  . midodrine  5 mg Oral TID WC  . sodium chloride flush  10-40 mL Intracatheter Q12H  . spironolactone  25 mg Oral Daily  . thiamine  100 mg Oral Daily  . vitamin B-12  1,000 mcg Oral Daily   Continuous Infusions:    LOS: 14 days    Time spent: over 30 min  Fayrene Helper, MD [facility]   To contact the attending provider between 7A-7P or the covering provider during after hours 7P-7A, please log into the web site [email or web address] and access using universal [place] password for that web site. If you do not have the password, please call the hospital operator.  01/12/2020, 9:36 AM

## 2020-01-12 NOTE — Consult Note (Signed)
Renal Service Consult Note [facility]  [name] [name] 01/12/2020 Sol Blazing Requesting Physician:  Dr Florene Glen, C.  Reason for Consult:  AKI HPI: The patient is a 74 y.o. year-old with hx of dCHF, OSA, COPD on 2L [place], afib, CKD3, sp PPM, depression, hx breast Ca , morbid obesity presented to ED on found down at home w/ AMS.  In ED SpO2 was 80%, R side pressure ulcers, unable to stand. In ED pt hypoxic, creat 2.4.  Admitted. Pt was admitted and tx'd to ICU and put on intermittent Bipap. Since admission has been getting various doses of IV lasix 86m - 851m2-3 times per day.  She remains edematous. Wt's are down about 7kg. Renal function is worse than baseline but stable w/ B/Cr around 90/ 2.2.  Asked to see for renal failure.   Review of multiple CXR's did not show pulm edema. Pt unable to give any hx.   ROS - n/a   Past Medical History  Past Medical History:  Diagnosis Date  . Anemia   . Asthma 06/13/2011   hx of  . Atrial fibrillation ([facility]  . Atrial flutter ([facility] [place]  . Breast cancer ([facility]dx[place] 2005   No BP check or stick in Left arm  . CAD (coronary artery disease)    a. Nonobstructive CAD 2007 (EF 75%.  RCA  proximal 75% tubular, 25% prox LAD, 25% d1, 50% D2) [facility]. NSTEMI 01/2010 in setting of respiratory failure, medical approach (not a good candidate for noninvasive eval)  . Cellulitis    hx of cellulitis in left leg  . Cervical radiculopathy 06/13/2011  . CHF (congestive heart failure) (HCCoal  . Complication of anesthesia    per patient "hard to wake up and come out of the aneshthesia, happened years ago"  . COPD (chronic obstructive pulmonary disease) (HCF[place]std[place]   on O2  . Depression 06/13/2011  . Diverticulosis   . Esophageal dysmotility   . Fatty liver   . Gout 06/13/2011  . History of Clostridium difficile early dec 2015   no current diarrhea  . History of home oxygen therapy    2 liters per nasal cannula all the time  . Hx of dysfunctional  uterine bleeding    last bleeding jan 2016, worked up for with d and c x 2 no cause found  . Hyperlipidemia   . Hypertension   . Hypothyroidism   . Impaired glucose tolerance 11/01/2012  . Myocardial infarction (HCM[place]  . Neuropathy    per patient in hands and in feet  . Obesity hypoventilation syndrome (HCFerndale9/23/2012  . OSA on CPAP   . Personal history of chemotherapy   . Personal history of radiation therapy   . PFO (patent foramen ovale)   . Presence of permanent cardiac pacemaker   . Risk for falls   . Syncope and collapse    Bradycardia with pauses. S/P pacemaker  . Tubular adenoma of colon    Past Surgical History  Past Surgical History:  Procedure Laterality Date  . APPENDECTOMY    . BREAST BIOPSY    . BREAST LUMPECTOMY Left 05/2004  . CARDIOVERSION N/A 03/15/2013   Procedure: CARDIOVERSION;  Surgeon: PhThayer HeadingsMD;  [facility];  Service: Cardiovascular;  Laterality: N/A;  . [facility]  [facility] 2013   [facility]  . [facility]  2004 or 2005   have cadaver bones,, screws, and  plates c 5 to c 6  . COLONOSCOPY    . COLONOSCOPY WITH PROPOFOL N/A 10/22/2014   Procedure: COLONOSCOPY WITH PROPOFOL;  Surgeon: Jerene Bears, MD;  Location: [facility] ENDOSCOPY;  Service: Gastroenterology;  Laterality: N/A;  . DILATION AND CURETTAGE OF UTERUS    . EPIGASTRIC HERNIA REPAIR    . EPIGASTRIC HERNIA REPAIR N/A 10/29/2019   Procedure: EPIGASTRIC HERNIA REPAIR WITH MESH;  Surgeon: Coralie Keens, MD;  Location: [place];  Service: General;  Laterality: N/A;  LMA ANESTHESIA  . EYE SURGERY    . LAPAROSCOPIC APPENDECTOMY  03/29/2012   Procedure: APPENDECTOMY LAPAROSCOPIC;  Surgeon: Adin Hector, MD;  Location: [facility] ORS;  Service: General;  Laterality: N/A;  . LOOP RECORDER EXPLANT  08/31/2013   Procedure: LOOP RECORDER EXPLANT;  Surgeon: Coralyn Mark, MD;  Location[facility] CATH LAB;  Service: Cardiovascular;;  . LOOP RECORDER IMPLANT  08-28-2013; 08-31-2013    MDT LinQ implanted by Dr Rayann Heman for syncope; explanted 08-31-2013 after sinus pauses identified  . LOOP RECORDER IMPLANT N/A 08/28/2013   Procedure: LOOP RECORDER IMPLANT;  Surgeon: Coralyn Mark, MD;  Location: [place] CATH LAB;  Service: Cardiovascular;  Laterality: N/A;  . OVARIAN CYST REMOVAL    . PACEMAKER INSERTION  08-31-2013   Biotronik Evera dual chamber pacemaker placed for neurocardiogenic syncope and sinus pauses by Dr Rayann Heman  . PERMANENT PACEMAKER INSERTION N/A 08/31/2013   Procedure: PERMANENT PACEMAKER INSERTION;  Surgeon: Coralyn Mark, MD;  Location: [place] CATH LAB;  Service: Cardiovascular;  Laterality: N/A;  . TEE WITHOUT CARDIOVERSION N/A 03/15/2013   Procedure: TRANSESOPHAGEAL ECHOCARDIOGRAM (TEE);  Surgeon: Thayer Headings, MD;  Location: [facility] ENDOSCOPY;  Service: Cardiovascular;  Laterality: N/A;   Family History  Family History  Problem Relation Age of Onset  . Uterine cancer Mother   . Heart disease Mother   . Breast cancer Sister 44  . Ovarian cancer Sister   . Breast cancer Cousin 40  . Diabetes Brother   . Breast cancer Maternal Aunt   . Ovarian cancer Sister    Social History  reports that she quit smoking about 21 years ago. Her smoking use included cigarettes. She has a 20.00 pack-year smoking history. She has never used smokeless tobacco. She reports current alcohol use. She reports that she does not use drugs. Allergies  Allergies  Allergen Reactions  . Cephalexin Other (See Comments)    Exfoliative dermatitis reaction Fine on ceftriaxone  . Ciprofloxacin Other (See Comments)    Folliculitis   . Lisinopril Cough  . Codeine     REACTION: unknown - pt. states unaware of having reaction to codeine  . Latex Dermatitis  . Tape Dermatitis  . Cleocin [Clindamycin Hcl] Rash  . Dilaudid [Hydromorphone] Other (See Comments)    Oversedation  . Doxycycline Rash  . Penicillins Rash    Did it involve swelling of the face/tongue/throat, SOB, or low BP? No Did it  involve sudden or severe rash/hives, skin peeling, or any reaction on the inside of your mouth or nose? Yes Did you need to seek medical attention at a hospital or doctor's office?Yes When did it last happen?several years ago If all above answers are "NO", may proceed with cephalosporin use.   Home medications Prior to Admission medications   Medication Sig Start Date End Date Taking? Authorizing Provider  albuterol (PROAIR HFA) 108 (90 Base) MCG/ACT inhaler Inhale 2 puffs into the lungs See admin instructions. Inhale 2 puffs into the lungs in the morning,  2 puffs at bedtime, and one to two times daily as needed for shortness of breath or wheezing   Yes [provider]  apixaban (ELIQUIS) 5 MG TABS tablet Take 1 tablet (5 mg total) by mouth 2 (two) times daily. 10/02/19  Yes Biagio Borg, MD  AZO-CRANBERRY PO Take 1 tablet by mouth daily as needed (urinary symptoms).    Yes [provider]  B Complex Vitamins (VITAMIN B COMPLEX PO) Take 1 tablet by mouth daily.   Yes [provider]  budesonide-formoterol (SYMBICORT) 160-4.5 MCG/ACT inhaler Inhale 2 puffs into the lungs 2 (two) times daily. 10/22/19  Yes Biagio Borg, MD  cholecalciferol (VITAMIN D3) 25 MCG (1000 UNIT) tablet Take 2,000 Units by mouth daily.   Yes [provider]  diphenhydrAMINE (BENADRYL) 25 MG tablet Take 25 mg by mouth every 6 (six) hours as needed for allergies.   Yes [provider]  hydrOXYzine (ATARAX/VISTARIL) 25 MG tablet Take 25 mg by mouth 3 (three) times daily as needed for itching.   Yes [provider]  OXYGEN Inhale 2-3 L/min into the lungs continuous.    Yes [provider]  potassium chloride SA (KLOR-CON) 20 MEQ tablet Take 1 tablet (20 mEq total) by mouth 2 (two) times daily. 11/06/19  Yes Shelly Coss, MD  traMADol (ULTRAM) 50 MG tablet Take 1 tablet (50 mg total) by mouth daily as needed for moderate pain. Patient taking differently: Take  50 mg by mouth every 6 (six) hours as needed for moderate pain.  10/02/19  Yes Biagio Borg, MD  vitamin C (ASCORBIC ACID) 500 MG tablet Take 500 mg by mouth daily.    Yes [provider]  VITAMIN E PO Take 1,000 Units by mouth daily.    Yes [provider]  allopurinol (ZYLOPRIM) 300 MG tablet TAKE 1 TABLET DAILY Patient not taking: No sig reported 05/17/17   Biagio Borg, MD  amitriptyline (ELAVIL) 100 MG tablet Take 1 tablet (100 mg total) by mouth at bedtime. Patient not taking: Reported on 01/16/2020 06/13/19   Biagio Borg, MD  atorvastatin (LIPITOR) 40 MG tablet TAKE 1 TABLET DAILY Patient not taking: No sig reported 09/26/18   Lelon Perla, MD  clonazePAM (KLONOPIN) 0.5 MG tablet Take 1 tablet (0.5 mg total) by mouth 2 (two) times daily as needed for anxiety. Patient not taking: Reported on 12/26/2019 12/19/19   Biagio Borg, MD  DULoxetine (CYMBALTA) 30 MG capsule TAKE 2 CAPSULES (60 MG TOTAL) BY MOUTH AT BEDTIME. Patient not taking: Reported on 12/22/2019 12/17/19   Biagio Borg, MD  furosemide (LASIX) 40 MG tablet Take 1 tablet (40 mg total) by mouth 2 (two) times daily. Patient not taking: Reported on 01/15/2020 11/06/19   Shelly Coss, MD  levothyroxine (SYNTHROID) 200 MCG tablet Take 1 tablet (200 mcg total) by mouth daily. Annual appt due in Sept must see provider for future refills Patient not taking: Reported on 01/12/2020 12/13/19   Biagio Borg, MD  metoprolol tartrate (LOPRESSOR) 25 MG tablet Take 0.5 tablets (12.5 mg total) by mouth as needed (for heart rate above 120 at rest). Patient not taking: Reported on 01/11/2020 09/28/19   Lelon Perla, MD  omeprazole (PRILOSEC) 40 MG capsule Take 1 capsule (40 mg total) by mouth daily. Patient not taking: Reported on 01/04/2020 05/10/19   Domenic Moras, PA-C     Vitals:   01/12/20 0813 01/12/20 0851 01/12/20 1200 01/12/20 1337  BP:  102/70 105/78   Pulse:  86    Resp:  _0 Temp:  97.8 F (36.6 C)     TempSrc:  Oral    SpO2: 94% 91%    Weight:      Height:       Exam Gen pt is confused, repeats "[facility]" when  asked orientation questions; not in distress, lying at 20 deg No rash, cyanosis or gangrene Sclera anicteric, throat clear  No jvd or bruits Chest clear bilat no rales or wheezing RRR no MRG Abd soft ntnd no mass or ascites +bs GU defer MS no joint effusions or deformity Ext diffuse 2-3+ LE/ hip and UE edema Neuro is arousable, disoriented and lethargic, debilitated    Home meds:  - eliquis 5 bid  - home O2 2-3 L/ symbicort bid  - elavil 100 hs/ clonazepam 0.5 bid prn/ duloxetine 60 hs/ tramadol qid prn  - metoprolol 12.5 mg prn for HR > 125/ lasix 40 bid  - synthroid / prilosec 40/ lipitor 40/ allopurinol 300 qd/ kdur 20 bid    Since admit 4/10 >> total 19L in and 17 L out = +2 L    Wt's >> 136kg on 4/10 >>  129kg on 4/24  = down 7kg    UOP  2.4 - 1.9L - 1.1L the last 3 days    Lasix 40 iv bid for several days then 60 mg IV tid the last 4 days     Aldactone the last 4 days     BP's low to normal 100's - 120''s since admit, no severe hypotension    CXR's x 8 reviewed since admit, no CHF, no active disease     B/cr 67/ 2.53 on admit 4/10 >> 94/ 2.08 today 4/24      BL creat feb- march 2021 = 1.1- 1.3, eGFR 45- 59      AKI episode feb 2021 1.89 > 1.00 at dc      UA 4/10 - cloudy 100 prot, 0-5 rbc/ wbc      Renal US - 9.9/ 10.3 cm kidneys w/o hydro. RUQ local ascites, possible   cirrhotic change of the liver       Assessment/ Plan: 1. AoCKD 3 - baseline creat 1.1- 1.3 from Feb 2021. Creat 2.0 here w/ eGFR 28 ml/min.  Suspect AKI hemodynamic due to diuretics w/ intravasc vol depletion+ soft BP's hypoperfusion+ poss hepatorenal syndrome. Other possibility would be chronic CHF (diast or R sided HF) causing anasarca. Has been here 2wks , down 7kg, has another 10-20 kg on. Has hepatic enceph w/ NH3 > 100. Pt is debilitated and very sick from a short and long-term  view. More aggressive diuresis puts pt at too risk for worsening AKI. Fluids not an option given anasarca. Pt is not a dialysis candidate given severe comorbidities. Is already ready on 1/2 of HRS Rx protocol, will add octreotide, ^midodrine to 10 tid. Cont IV albumin. Not sure this is this will help. DC lasix for now. Would get palliative team involved.  2. Cirrhosis  3. Hepatic encephalopathy 4. Morbid obesity 5. Anasarca - see above 6. Chronic resp failure/ COPD / home O2/ OHS 7. Atrial fib 8. H/o breast Ca      Kelly Splinter  MD 01/12/2020, 5:44 PM  Recent Labs  Lab 01/11/20 0719 01/12/20 0620  WBC 5.8 5.3  HGB 9.5* 9.2*   Recent Labs  Lab 01/06/20 1200 01/06/20 1200 01/07/20 0301 01/07/20 0301 01/08/20  3818 01/08/20 0423 01/09/20 2993 01/09/20 7169 01/10/20 0423 01/10/20 0423 01/11/20 0719 01/12/20 0620  K 3.9   < > 4.1   < > 4.3   < > 4.3   < > 4.4   < > 4.4 4.4  BUN 76*   < > 78*   < > 85*   < > 95*   < > 91*   < > 91* 94*  CREATININE 1.98*   < > 2.08*   < > 2.15*   < > 2.17*   < > 2.08*   < > 1.97* 2.08*  CALCIUM 9.1  --  9.4  --  9.3  --  9.5  --  9.6  --  9.6 9.6  PHOS  --   --   --   --   --   --  4.1  --  3.7  --   --  4.7*   < > = values in this interval not displayed.

## 2020-01-13 DIAGNOSIS — J9622 Acute and chronic respiratory failure with hypercapnia: Secondary | ICD-10-CM | POA: Diagnosis not present

## 2020-01-13 DIAGNOSIS — J9621 Acute and chronic respiratory failure with hypoxia: Secondary | ICD-10-CM | POA: Diagnosis not present

## 2020-01-13 DIAGNOSIS — R400 Somnolence: Secondary | ICD-10-CM | POA: Diagnosis not present

## 2020-01-13 DIAGNOSIS — R404 Transient alteration of awareness: Secondary | ICD-10-CM | POA: Diagnosis not present

## 2020-01-13 DIAGNOSIS — K7290 Hepatic failure, unspecified without coma: Secondary | ICD-10-CM | POA: Diagnosis not present

## 2020-01-13 LAB — GLUCOSE, CAPILLARY
Glucose-Capillary: 104 mg/dL — ABNORMAL HIGH (ref 70–99)
Glucose-Capillary: 107 mg/dL — ABNORMAL HIGH (ref 70–99)
Glucose-Capillary: 108 mg/dL — ABNORMAL HIGH (ref 70–99)
Glucose-Capillary: 118 mg/dL — ABNORMAL HIGH (ref 70–99)
Glucose-Capillary: 70 mg/dL (ref 70–99)
Glucose-Capillary: 88 mg/dL (ref 70–99)
Glucose-Capillary: 98 mg/dL (ref 70–99)

## 2020-01-13 NOTE — Progress Notes (Signed)
[facility] Progress Note  Subjective: UOP 525 cc yest  Vitals:   01/13/20 0100 01/13/20 0300 01/13/20 0400 01/13/20 0500  BP:   107/66   Pulse:      Resp: 18 (!) 23 (!) 32 19  Temp:   98.5 F (36.9 C)   TempSrc:   Oral   SpO2:   98%   Weight:      Height:        Exam: Gen pt is confused No jvd or bruits Chest clear bilat no rales or wheezing RRR no MRG Abd soft ntnd no mass or ascites +bs GU defer MS no joint effusions or deformity Ext diffuse 2-3+ LE/ hip and UE edema Neuro is arousable, disoriented and lethargic, debilitated    Home meds:  - eliquis 5 bid  - home O2 2-3 L/ symbicort bid  - elavil 100 hs/ clonazepam 0.5 bid prn/ duloxetine 60 hs/ tramadol   - metoprolol 12.5 mg prn for ^HR / lasix 40 bid  - synthroid / prilosec 40/ lipitor 40/ allopurinol 300 qd/ kdur 20 bid    Since admit 4/10 >> total 19L in and 17 L out = +2 L    Wt's >> 136kg on 4/10 >>  129kg on 4/24  = down 7kg    UOP  2.4 - 1.9L - 1.1L the last 3 days    Lasix 40 iv bid x 4-5d then 60 mg IV tid the last several days     Aldactone the last 4 days     BP's low to normal 100's - 120''s since admit, no sig hypotension    CXR's x 8 since admit, no CHF, no active disease     B/cr 67/ 2.53 on admit 4/10 >> 94/ 2.08 on 4/24      BL creat feb- march 2021 = 1.1- 1.3, eGFR 45- 59      AKI episode feb 2021 1.89 > 1.00 at dc      UA 4/10 - cloudy 100 prot, 0-5 rbc/ wbc      Renal US - 9.9/ 10.3 cm kidneys w/o hydro. RUQ local ascites, possible cirrhotic change of the liver       Assessment/ Plan: 1. AoCKD 3 - baseline creat 1.1- 1.3 from Feb 2021. Creat 2.0 here w/ eGFR 28 ml/min.  Suspect AKI hemodynamic due to diuretics w/ intravasc vol depletion+ soft BP's hypoperfusion+ poss hepatorenal syndrome. Anasarca likely due to dCHF/ R HF and/or cirrhosis. Has been here 2wks , down 7kg, has another 10-20 kg onboard. Has hepatic enceph w/ NH3 > 100 indicating advanced cirrhosis.  More  aggressive diuresis would be too high risk for worsening AKI. Fluids not an option given anasarca. Pt is not a dialysis candidate given severe comorbidities. Treating w/ HRS protocol for now (midodrine/ octreotide/ IV alb). Have stopped lasix due to concern over poss HRS. With mult comorbidities/ multiple organ failure, prognosis is very poor and would consider idea of more conservative care or palliative care.  2. Cirrhosis w/ ^^NH3/ hepatic enceph 3. Morbid obesity 4. Anasarca - see above 5. Chronic resp failure/ COPD / on home O2/ OHS 6. Atrial fib 7. H/o breast Ca 8. DNR       Kelly Splinter 01/13/2020, 6:41 AM   Recent Labs  Lab 01/10/20 0423 01/10/20 0423 01/11/20 0719 01/12/20 0620  K 4.4   < > 4.4 4.4  BUN 91*   < > 91* 94*  CREATININE 2.08*   < > 1.97*  2.08*  CALCIUM 9.6   < > 9.6 9.6  PHOS 3.7  --   --  4.7*  HGB 9.2*   < > 9.5* 9.2*   < > = values in this interval not displayed.   Inpatient medications: . allopurinol  50 mg Oral Daily  . apixaban  5 mg Oral BID  . arformoterol  15 mcg Nebulization BID  . budesonide (PULMICORT) nebulizer solution  0.25 mg Nebulization BID  . famotidine  20 mg Oral Daily  . feeding supplement (ENSURE ENLIVE)  237 mL Oral TID BM  . ferrous sulfate  325 mg Oral Q breakfast  . folic acid  1 mg Oral Daily  . free water  200 mL Oral Q4H  . lactulose  30 g Oral QID  . levothyroxine  200 mcg Oral Q0600  . midodrine  10 mg Oral TID WC  . octreotide  200 mcg Subcutaneous Q12H  . rifaximin  550 mg Oral BID  . sodium chloride flush  10-40 mL Intracatheter Q12H  . thiamine  100 mg Oral Daily  . vitamin B-12  1,000 mcg Oral Daily   . sodium chloride 250 mL (01/12/20 2236)  . albumin human 25 g (01/12/20 2328)  . cefTRIAXone (ROCEPHIN)  IV 2 g (01/12/20 2240)   sodium chloride, acetaminophen **OR** acetaminophen, albuterol, dextrose, glycopyrrolate, ondansetron (ZOFRAN) IV, oxyCODONE

## 2020-01-13 NOTE — Progress Notes (Addendum)
PROGRESS NOTE    [name] [name]  XHB:[id number] DOB: 05-Apr-1946 DOA: 01/15/2020 PCP: [name] Borg, MD   Chief Complaint  Patient presents with  . Fall    Brief Narrative:  74 yo F PMH OSA, COPD on 2LNC, diastolic CHF, Afib, CKD III, SSS s/p PPM, PFO, hypothyroidism, depression, breast cancer s/p chemo and morbid obesity who presented to ED 4/10 with AMS after being found down at home. EMS was dispatched to patient's home after neighbors noticed mail was full. Upon EMS arrival, pt was hypoxic SpO2 80% on RA and had pressure ulcers on R side of body, and endorsed global myalgia. Patient reported that she sustained fall approx 6 days prior to EMS dispatch and was unable to stand self.   In ED, pt hypoxic but VBG not overtly hypercarbic. CK 2000, Cr 2.48. Admitted to [facility].  On 4/15, patient with progressively worsening resp status, as well as complex medical picture including rhabdo/AKI s/p fluid restriction due to pulm status, elevated BNP despite fluid restriction and ongoing diuresis.  Transfer to ICU.  She was put on intermittent BiPAP.  Diuresed with IV Lasix with improvement in her breathing.  She was transferred back to [facility] on 4/20.  Assessment & Plan:   Active Problems:   LFT elevation   AMS (altered mental status)   Non-traumatic rhabdomyolysis   Acute on chronic respiratory failure with hypoxia and hypercapnia (HCC)   Non-pressure chronic ulcer of other part of right foot limited to breakdown of skin ([place])   Fall   Moderate protein-calorie malnutrition ([place])   Idiopathic chronic gout without tophus   Acute respiratory failure with hypoxia ([place])   Palliative care by specialist   DNR (do not resuscitate)   Weakness generalized   Hepatic encephalopathy ([place])  Acute metabolic encephalopathy:  Multifactorial including respiratory failure.   TSH 6.63.  Free T4 normal.  B12 5.7K.  No obvious signs of infection.  No focal neuro deficit.  CT head without acute finding.  Ammonia  rising, 152 on 4/24.  Hypercarbia with acidemia.  She continues to be significantly lethargic today.  Seems like her encephalopathy is related to hypercarbia and hepatic encephalopathy at this point. - improved, but still not to baseline - continue nightly bipap - ammonia elevated (rising 4/23), continue lactulose (increased freq) - addendum: ammonia worsening to 150's -> will c/s GI - VBG unable to be drawn today, follow repeat labs when able -Frequent orientation and delirium precautions.  Acute on chronic RF with hypoxia and hypercarbia: Multifactorial including OSA/possible OHS, COPD and PFO.  Not sure if she is compliant with CPAP at home.  On 2 L at baseline. -currently on 3 L [place] -Minimum oxygen to maintain saturation above 88% -needs bipap nightly -Continue Brovana, pulmicort, prn albuterol nebs -CXR with mild central pulm vascular congestion -repeat CXR without acute abnormalities  Acute on chronic diastolic heart failure: Echo on 4/11 with EF of 55 to 60%, no RWMA, indeterminate diastolic dysfunction, moderate LAE, severe RAE moderate TVR, moderate AVR and RVSP of 46.6.  BNP 2000 (higher than baseline).  Still with significant fluid overload clinically.  UOP 1 L 4/23 (foley now out).  Weight down. - lasix and spiro on hold right now with renal function -Midodrine 10 mg 3 times daily for soft blood pressures -Strict intake and output -Sodium restriction -Repeat BNP elevated -Renal has been c/s  AKI on CKD-3a/azotemia: Baseline Cr ~1.4> 2.48 (admit)> 2.61 (peak)>> 2.08 4/25, fluctuating with diuresis.  Renal ultrasound without  significant finding.  She could be [name] [name] combination of prerenal insult in the setting of dehydration and possible pigment nephropathy from rhabdo.  Still with significant fluid overload on exam. - no labs from today difficult to draw with edema - nephrology has been c/s - suspects AKI hemodynamic 2/2 diuretics with intravasc depletion + soft BP's with  hypoperfusion + poss hepatorenal.  Other poss includes chronic HF causing anasarca.  Pt not dialysis candidate.  For now, treating with HRS protocol.  Lasix has been held.    Hypervolemic hypernatremia: Na 147 (admit)> 156 (peak)> 146 -Diuretics as above. -Continue free water per p.o.   Fall at home Debility/physical deconditioning Traumatic rhabdomyolysis-resolved. -PT/OT recommended SNF.  Hypoglycemia: Likely due to poor Po p.o. intake. - d/c D10 and follow POC BG's  Paroxysmal [name] [name]. Fib: Now in sinus rhythm without medications.  CHA2DS2-VASc score greater than 3. -Continue Eliquis for anticoagulation for now.  Need to discuss long term risk and benefit given risk of fall.  SSS s/p PPM-stable.  Macrocytic anemia: B12 level high, folate wnl.  H&H stable. - ferritin low normal, overall labs wnl.  Hypoproliferative retics. - will start PO iron  Hypothyroidism: TSH 6.6. -Continue home Synthroid  Thrombocytopenia: Improving. -Continue monitoring  Dysphagia -Appreciate SLP input-dysphagia 2 diet.  Morbid obesity:Body mass index is 45.23 kg/m. -Challenging issue given patient's comorbidity and immobility.  History of gout: Uric acid elevated but patient denies pain.  Could be due to renal failure. - Follow uric acid intermittently  -restart allopurinol, low dose with aki  Mood Disorder: med rec notes she's not taking many of her psych meds.  Currently still has AMS.  Palliative recommending psych c/s, will continue to monitor - consider if mental status improves [name] [name] bit more.  Goal of care discussion: Patient with significant comorbidities and debility as above.  She is [name] [name] still full code.  Per previous provider "I do not believe patient has capacity to make reasonable medical decision.  I discussed with patient's daughter, [name] [name] who states she is the only close family.  [name] [name] is working to  [employer] and guardianship paper.  After discussion about patient's comorbidity, quality  of life, CODE STATUS and pros and cons of CPR and intubation, [name] [name] requested for CODE STATUS to be changed to DNR/DNI."  -CODE STATUS changed to DNR/DNI -Palliative care consulted  Nutrition Nutrition Problem: Inadequate oral intake Etiology: poor appetite  Signs/Symptoms: meal completion < 50%  Interventions: Prostat, Hormel Shake  R sided skin breakdown, present on admission-no signs of infection. -wound care, frequent turns.  RIJ central line Discontinue central line, establish PIV (central line still in place today, will discuss removal with RN)  Foley in place:  Will d/c foley at this time  Elevated LFT's: mild, follow acute hepatitis panel, continue to monitor.  RUQ [place] 4/21 with sludge and stones with nonspecific gallbladder wall thickening (2/2 acute vs chronic cholecystitis, hepatic disease, or edema forming states).  Echogenic liver.  ? Cirrhosis. Follow inr.  Normal bili, platelets.  Albumin 2.7. [place] for ascites shows small mod volume.  On abx with her encephalopathy to cover SBP - with poor prognosis will discuss with daughter whether she'd be interested in para.  DVT prophylaxis: eliquis Code Status: DNR Family Communication: none at bedside, daughter over phone 4/25 Disposition:   Status is: Inpatient  Remains inpatient appropriate because:Altered mental status, IV treatments appropriate due to intensity of illness or inability to take PO, Inpatient level of care appropriate due to severity of illness  and continued need for IV diuresis, monitoring of renal function in setting of volume overload   Dispo: The patient is from: Home              Anticipated d/c is to: SNF              Anticipated d/c date is: 3 days              Patient currently is not medically stable to d/c.  Consultants:   none  Procedures:  Echo IMPRESSIONS    1. Limted bubble study Bi atrial enlargement with pacing wire in RA/RV.  Mobile redundant atrial septum with positive  bubble study for right to  left shunt fairly large amount of bubbles crossing septum.   Echo IMPRESSIONS    1. Left ventricular ejection fraction, by estimation, is 55 to 60%. The  left ventricle has normal function. The left ventricle has no regional  wall motion abnormalities. Left ventricular diastolic parameters are  indeterminate.  2. Right ventricular systolic function is normal. The right ventricular  size is normal. There is moderately elevated pulmonary artery systolic  pressure.  3. Left atrial size was moderately dilated.  4. Right atrial size was severely dilated.  5. The mitral valve is grossly normal. Mild to moderate mitral valve  regurgitation.  6. Tricuspid valve regurgitation is moderate.  7. The aortic valve is tricuspid. Aortic valve regurgitation is moderate.  Mild aortic valve stenosis.   LE [place] Summary:  RIGHT:  - Findings appear essentially unchanged compared to previous examination.  - There is no evidence of deep vein thrombosis in the lower extremity.  However, portions of this examination were limited- see technologist  comments above.    LEFT:  - There is no evidence of deep vein thrombosis in the lower extremity.  However, portions of this examination were limited- see technologist  comments above.   UE [place] Summary:    Right:  No evidence of deep vein thrombosis in the upper extremity. However,  unable to  visualize the axillary, cephalic, and basilic veins.    Left:  No evidence of deep vein thrombosis in the upper extremity. However,  unable to  visualize the cephalic, basilic, and internal jugular veins.    Antimicrobials:  Anti-infectives (From admission, onward)   Start     Dose/Rate Route Frequency Ordered Stop   01/12/20 2200  rifaximin (XIFAXAN) tablet 550 mg     550 mg Oral 2 times daily 01/12/20 1803     01/12/20 1930  cefTRIAXone (ROCEPHIN) 2 g in sodium chloride 0.9 % 100 mL IVPB     2 g 200 mL/hr over 30  Minutes Intravenous Every 24 hours 01/12/20 1841     01/02/20 0600  vancomycin (VANCOREADY) IVPB 1250 mg/250 mL  Status:  Discontinued     1,250 mg 166.7 mL/hr over 90 Minutes Intravenous Every 48 hours 12/31/19 0801 01/01/20 0757   12/31/19 1700  cefTRIAXone (ROCEPHIN) 2 g in sodium chloride 0.9 % 100 mL IVPB  Status:  Discontinued     2 g 200 mL/hr over 30 Minutes Intravenous Every 24 hours 12/31/19 1250 01/01/20 0757   12/31/19 1000  aztreonam (AZACTAM) 1 g in sodium chloride 0.9 % 100 mL IVPB  Status:  Discontinued     1 g 200 mL/hr over 30 Minutes Intravenous Every 8 hours 12/31/19 0801 12/31/19 1250   12/31/19 0900  vancomycin (VANCOREADY) IVPB 2000 mg/400 mL     2,000 mg 200 mL/hr  over 120 Minutes Intravenous  Once 12/31/19 0801 12/31/19 1221   12/31/19 0800  metroNIDAZOLE (FLAGYL) IVPB 500 mg  Status:  Discontinued     500 mg 100 mL/hr over 60 Minutes Intravenous Every 8 hours 12/31/19 0753 01/01/20 0757      Subjective: More awake then yesterday Follows some commands Able to say her name  Objective: Vitals:   01/13/20 0500 01/13/20 0728 01/13/20 0805 01/13/20 0808  BP:  117/82    Pulse:  84    Resp: 19 (!) 22    Temp:  99 F (37.2 C)    TempSrc:  Oral    SpO2:  96% 96% 95%  Weight:      Height:        Intake/Output Summary (Last 24 hours) at 01/13/2020 1241 Last data filed at 01/13/2020 0733 Gross per 24 hour  Intake 170.51 ml  Output 500 ml  Net -329.49 ml   Filed Weights   01/10/20 0033 01/11/20 0430 01/12/20 0004  Weight: 132.1 kg 131.6 kg 128.9 kg    Examination:  General: No acute distress. Cardiovascular: Heart sounds show Stclair Szymborski regular rate, and rhythm.  Lungs: Clear to auscultation bilaterally  Abdomen: Soft, nontender, nondistended  Neurological: Alert and oriented 3. Moves all extremities 4 with equal strength. Cranial nerves II through XII grossly intact. Skin: Warm and dry. No rashes or lesions. Extremities: anasarca  Data Reviewed: I have  personally reviewed following labs and imaging studies  CBC: Recent Labs  Lab 01/08/20 0423 01/09/20 0438 01/10/20 0423 01/11/20 0719 01/12/20 0620  WBC 5.3 5.4 5.6 5.8 5.3  NEUTROABS  --   --   --   --  4.0  HGB 9.9* 9.4* 9.2* 9.5* 9.2*  HCT 34.3* 33.3* 31.7* 32.9* 32.4*  MCV 104.9* 105.0* 102.6* 103.1* 105.9*  PLT 133* 153 178 215 416    Basic Metabolic Panel: Recent Labs  Lab 01/08/20 0423 01/09/20 0438 01/10/20 0423 01/11/20 0719 01/12/20 0620  NA 147* 146* 145 146* 146*  K 4.3 4.3 4.4 4.4 4.4  CL 108 107 106 106 105  CO2 31 29 30  32 31  GLUCOSE 97 125* 88 103* 95  BUN 85* 95* 91* 91* 94*  CREATININE 2.15* 2.17* 2.08* 1.97* 2.08*  CALCIUM 9.3 9.5 9.6 9.6 9.6  MG  --  2.3 2.3 2.3 2.4  PHOS  --  4.1 3.7  --  4.7*    GFR: Estimated Creatinine Clearance: 33.7 mL/min (Rebeccah Ivins) (by C-G formula based on SCr of 2.08 mg/dL (H)).  Liver Function Tests: Recent Labs  Lab 01/07/20 0301 01/07/20 0301 01/09/20 0438 01/09/20 0726 01/10/20 0423 01/11/20 0719 01/12/20 0620  AST 28  --   --  32 78* 69* 59*  ALT 26  --   --  30 49* 60* 59*  ALKPHOS 173*  --   --  208* 273* 305* 323*  BILITOT 0.9  --   --  0.7 0.8 1.1 0.8  PROT 5.9*  --   --  6.0* 6.1* 6.2* 6.4*  ALBUMIN 2.6*   < > 2.6* 2.6* 2.6*  2.6* 2.7* 2.7*   < > = values in this interval not displayed.    CBG: Recent Labs  Lab 01/13/20 0023 01/13/20 0403 01/13/20 0626 01/13/20 0724 01/13/20 1147  GLUCAP 98 70 108* 104* 118*     Recent Results (from the past 240 hour(s))  MRSA PCR Screening     Status: Abnormal   Collection Time: 01/03/20  5:46 PM  Specimen: Nasal Mucosa; Nasopharyngeal  Result Value Ref Range Status   MRSA by PCR POSITIVE (Janiesha Diehl) NEGATIVE Final    Comment:        The GeneXpert MRSA Assay (FDA approved for NASAL specimens only), is one component of [place] comprehensive MRSA colonization surveillance program. It is not intended to diagnose MRSA infection nor to guide or monitor treatment  for MRSA infections. RESULT CALLED TO, READ BACK BY AND VERIFIED WITH: Garfield Cornea RN 01/03/20 2013 JDW Performed at [facility], [place] [address], [place], [place] [address]   Respiratory Panel by RT PCR (Flu [place] Nase&B, Covid) - Nasopharyngeal Swab     Status: None   Collection Time: 01/09/20  2:41 PM   Specimen: Nasopharyngeal Swab  Result Value Ref Range Status   SARS Coronavirus 2 by RT PCR NEGATIVE NEGATIVE Final    Comment: (NOTE) SARS-CoV-2 target nucleic acids are NOT DETECTED. The SARS-CoV-2 RNA is generally detectable in upper respiratoy specimens during the acute phase of infection. The lowest concentration of SARS-CoV-2 viral copies this assay can detect is 131 copies/mL. Shanena Pellegrino negative result does not preclude SARS-Cov-2 infection and should not be used as the sole basis for treatment or other patient management decisions. Geanie Pacifico negative result may occur with  improper specimen collection/handling, submission of specimen other than nasopharyngeal swab, presence of viral mutation(s) within the areas targeted by this assay, and inadequate number of viral copies (<131 copies/mL). [place] negative result must be combined with clinical observations, patient history, and epidemiological information. The expected result is Negative. Fact Sheet for Patients:  PinkCheek.be Fact Sheet for Healthcare Providers:  GravelBags.it This test is not yet ap proved or cleared by the [place] FDA and  has been authorized for detection and/or diagnosis of SARS-CoV-2 by FDA under an Emergency Use Authorization (EUA). This EUA will remain  in effect (meaning this test can be used) for the duration of the COVID-19 declaration under Section 564(b)(1) of the Act, 21 U.S.C. section 360bbb-3(b)(1), unless the authorization is terminated or revoked sooner.    Influenza Mieko Kneebone by PCR NEGATIVE NEGATIVE Final   Influenza B by PCR NEGATIVE NEGATIVE Final     Comment: (NOTE) The Xpert Xpress SARS-CoV-2/FLU/RSV assay is intended as an aid in  the diagnosis of influenza from Nasopharyngeal swab specimens and  should not be used as Beuford Garcilazo sole basis for treatment. Nasal washings and  aspirates are unacceptable for Xpert Xpress SARS-CoV-2/FLU/RSV  testing. Fact Sheet for Patients: PinkCheek.be Fact Sheet for Healthcare Providers: GravelBags.it This test is not yet approved or cleared by the [place] FDA and  has been authorized for detection and/or diagnosis of SARS-CoV-2 by  FDA under an Emergency Use Authorization (EUA). This EUA will remain  in effect (meaning this test can be used) for the duration of the  Covid-19 declaration under Section 564(b)(1) of the Act, 21  U.S.C. section 360bbb-3(b)(1), unless the authorization is  terminated or revoked. Performed at [facility], [place] [address], [place], [place] [address]          Radiology Studies: DG CHEST PORT 1 VIEW  Result Date: 01/12/2020 CLINICAL DATA:  Hypoxia EXAM: PORTABLE CHEST 1 VIEW COMPARISON:  January 09, 2020 FINDINGS: Stable cardiomegaly. Stable pacemaker. The right IJ is been removed. No pneumothorax. No nodules or masses. No focal infiltrates or overt edema. IMPRESSION: Cardiomegaly.  No acute abnormalities noted. Electronically Signed   By: Dorise Bullion III M.D   On: 01/12/2020 13:56   [place] ASCITES (  ABDOMEN LIMITED)  Result Date: 01/12/2020 CLINICAL DATA:  Ascites EXAM: LIMITED ABDOMEN ULTRASOUND FOR ASCITES TECHNIQUE: Limited ultrasound survey for ascites was performed in all four abdominal quadrants. COMPARISON:  Renal sonogram of 01/08/2020 and recent CT of the abdomen pelvis. FINDINGS: Imaging was performed in all 4 quadrants. Small to moderate volume of ascites was noted greatest in RIGHT lower and RIGHT upper quadrant. Smaller volume in LEFT lower quadrant. Coarse hepatic echotexture incidentally noted.  IMPRESSION: Small moderate volume ascites. Nodular contour of the liver and coarsened hepatic echotexture remains suspicious for cirrhosis Electronically Signed   By: Zetta Bills M.D.   On: 01/12/2020 18:15        Scheduled Meds: . allopurinol  50 mg Oral Daily  . apixaban  5 mg Oral BID  . arformoterol  15 mcg Nebulization BID  . budesonide (PULMICORT) nebulizer solution  0.25 mg Nebulization BID  . famotidine  20 mg Oral Daily  . feeding supplement (ENSURE ENLIVE)  237 mL Oral TID BM  . ferrous sulfate  325 mg Oral Q breakfast  . folic acid  1 mg Oral Daily  . free water  200 mL Oral Q4H  . lactulose  30 g Oral QID  . levothyroxine  200 mcg Oral Q0600  . midodrine  10 mg Oral TID WC  . octreotide  200 mcg Subcutaneous Q12H  . rifaximin  550 mg Oral BID  . sodium chloride flush  10-40 mL Intracatheter Q12H  . thiamine  100 mg Oral Daily  . vitamin B-12  1,000 mcg Oral Daily   Continuous Infusions: . sodium chloride 250 mL (01/12/20 2236)  . albumin human 25 g (01/13/20 0646)  . cefTRIAXone (ROCEPHIN)  IV 2 g (01/12/20 2240)     LOS: 15 days    Time spent: over 6 min    Fayrene Helper, MD [facility]   To contact the attending provider between 7A-7P or the covering provider during after hours 7P-7A, please log into the web site [email or web address] and access using universal [place] password for that web site. If you do not have the password, please call the hospital operator.  01/13/2020, 12:41 PM

## 2020-01-13 NOTE — Progress Notes (Signed)
Placed Bipap mask on Pt. Pt desated to 70's.  Pt reports she does not want mask on. "I feel it is suffocating me".

## 2020-01-13 NOTE — Consult Note (Addendum)
[facility] Consult: 2:18 PM 01/13/2020  LOS: 15 days    Referring Provider: Dr Florene Glen  Primary Care Physician:  Biagio Borg, MD Primary Gastroenterologist:  Dr. Hilarie Fredrickson   Reason for Consultation: Encephalopathy, cirrhosis   HPI: [name] [name] is a 74 y.o. female.  PMH hypertension.  Morbid obesity BMI 44.  Diastolic CHF.  A. Fib, chronic Eliquis.  CKD 3.  Sick sinus syndrome, s/p pacemaker.  PFO.  Hypothyroidism.  Breast cancer treated with chemo.  Thrombocytopenia since 06/2020 platelets 120 5K Hepatic steatosis and/or hepatocellular disease on 05/2014 abdominal ultrasound Ventral hernia repair 10/2019.  No mention of changes in the liver but high density material in GB, possibly stones, normal ducts on CTAP without contrast of 11/01/19 Esophageal dysmotility on 04/2015 esophagram 10/22/2014 surveillance colonoscopy for Hx adenomatous polyps 5 years prior on colonoscopy at [facility].  2 sessile polyps removed, mild diverticulosis throughout the colon.  Pathology on both polyps: Tubular adenomas, no HGD  Patient 15 days into current admission.  Presented with AMS, found down at home.  Hypoxia, hypercapnic.  With sats 80%, pressure ulcers on right side of her body, global myalgia.  She had been down for approximately 6 days.  Ruled in for rhabdo with elevated CKs.  During hospitalization she developed worsening respiratory status and transferred to ICU requiring intermittent BiPAP.  Respiratory status improved on IV Lasix and went back to hospitalist service on 4/20.  AKI/?HRS.  Macrocytic anemia with low normal ferritin, started on oral iron.  Thrombocytopenia improving.  Protein malnutrition with low prealbumin.   She has dysphagia and SLP evaluated, approved her for dysphagia 2 diet.  GI is asked to see the patient  regarding elevated LFTs and new dx cirrhosis, hepatic encephalopathy.   Elevated LFTs but normal T bili.  Alkaline phosphatase 323.  Max transaminases 78/49. Ammonia level climbing at 152 today. Lactulose enema administered yesterday evening but she rapidly had large amount of brown stool out.  She is received 3 doses of oral lactulose overnight but has not had a bowel movement since that episode last night.  Day 2 rifaximin.  INR 1.5, 11 days ago.  Platelets as low as 89 but now normal at 224,   01/12/2020 abdominal ultrasound shows small to moderate ascites.  Nodular, coarse liver suspicious for cirrhosis   Past Medical History:  Diagnosis Date  . Anemia   . Asthma 06/13/2011   hx of  . Atrial fibrillation ([place])   . Atrial flutter ([place])   . Breast cancer ([place]) dx'd 2005   No BP check or stick in Left arm  . CAD (coronary artery disease)    a. Nonobstructive CAD 2007 (EF 75%.  RCA  proximal 75% tubular, 25% prox LAD, 25% d1, 50% D2) at [facility]. b. NSTEMI 01/2010 in setting of respiratory failure, medical approach (not a good candidate for noninvasive eval)  . Cellulitis    hx of cellulitis in left leg  . Cervical radiculopathy 06/13/2011  . CHF (congestive heart failure) ([place])   . Complication of anesthesia  per patient "hard to wake up and come out of the aneshthesia, happened years ago"  . COPD (chronic obstructive pulmonary disease) ([place])    on O2  . Depression 06/13/2011  . Diverticulosis   . Esophageal dysmotility   . Fatty liver   . Gout 06/13/2011  . History of Clostridium difficile early dec 2015   no current diarrhea  . History of home oxygen therapy    2 liters per nasal cannula all the time  . Hx of dysfunctional uterine bleeding    last bleeding jan 2016, worked up for with d and c x 2 no cause found  . Hyperlipidemia   . Hypertension   . Hypothyroidism   . Impaired glucose tolerance 11/01/2012  . Myocardial infarction ([place])   . Neuropathy    per patient in hands and  in feet  . Obesity hypoventilation syndrome ([place]) 06/13/2011  . OSA on CPAP   . Personal history of chemotherapy   . Personal history of radiation therapy   . PFO (patent foramen ovale)   . Presence of permanent cardiac pacemaker   . Risk for falls   . Syncope and collapse    Bradycardia with pauses. S/P pacemaker  . Tubular adenoma of colon     Past Surgical History:  Procedure Laterality Date  . APPENDECTOMY    . BREAST BIOPSY    . BREAST LUMPECTOMY Left 05/2004  . CARDIOVERSION N/A 03/15/2013   Procedure: CARDIOVERSION;  Surgeon: Thayer Headings, MD;  Location: [facility] ENDOSCOPY;  Service: Cardiovascular;  Laterality: N/A;  . CERVICAL BIOPSY  June 2013   at [facility] for Heavy  Bleeding  . [place] SURGERY  2004 or 2005   have cadaver bones,, screws, and plates c 5 to c 6  . COLONOSCOPY    . COLONOSCOPY WITH PROPOFOL N/A 10/22/2014   Procedure: COLONOSCOPY WITH PROPOFOL;  Surgeon: Jerene Bears, MD;  Location: [facility] ENDOSCOPY;  Service: Gastroenterology;  Laterality: N/A;  . DILATION AND CURETTAGE OF UTERUS    . EPIGASTRIC HERNIA REPAIR    . EPIGASTRIC HERNIA REPAIR N/A 10/29/2019   Procedure: EPIGASTRIC HERNIA REPAIR WITH MESH;  Surgeon: Coralie Keens, MD;  Location: [facility];  Service: General;  Laterality: N/A;  LMA ANESTHESIA  . EYE SURGERY    . LAPAROSCOPIC APPENDECTOMY  03/29/2012   Procedure: APPENDECTOMY LAPAROSCOPIC;  Surgeon: Adin Hector, MD;  Location: [facility] ORS;  Service: General;  Laterality: N/A;  . LOOP RECORDER EXPLANT  08/31/2013   Procedure: LOOP RECORDER EXPLANT;  Surgeon: Coralyn Mark, MD;  Location: [facility] CATH LAB;  Service: Cardiovascular;;  . LOOP RECORDER IMPLANT  08-28-2013; 08-31-2013   MDT LinQ implanted by Dr Rayann Heman for syncope; explanted 08-31-2013 after sinus pauses identified  . LOOP RECORDER IMPLANT N/A 08/28/2013   Procedure: LOOP RECORDER IMPLANT;  Surgeon: Coralyn Mark, MD;  Location: [facility] CATH LAB;  Service: Cardiovascular;  Laterality: N/A;  . OVARIAN  CYST REMOVAL    . PACEMAKER INSERTION  08-31-2013   Biotronik Evera dual chamber pacemaker placed for neurocardiogenic syncope and sinus pauses by Dr Rayann Heman  . PERMANENT PACEMAKER INSERTION N/A 08/31/2013   Procedure: PERMANENT PACEMAKER INSERTION;  Surgeon: Coralyn Mark, MD;  Location: [facility] CATH LAB;  Service: Cardiovascular;  Laterality: N/A;  . TEE WITHOUT CARDIOVERSION N/A 03/15/2013   Procedure: TRANSESOPHAGEAL ECHOCARDIOGRAM (TEE);  Surgeon: Thayer Headings, MD;  Location: [facility];  Service: Cardiovascular;  Laterality: N/A;    Prior to Admission medications  Medication Sig Start Date End Date Taking? Authorizing Provider  albuterol (PROAIR HFA) 108 (90 Base) MCG/ACT inhaler Inhale 2 puffs into the lungs See admin instructions. Inhale 2 puffs into the lungs in the morning, 2 puffs at bedtime, and one to two times daily as needed for shortness of breath or wheezing   Yes [provider]  apixaban (ELIQUIS) 5 MG TABS tablet Take 1 tablet (5 mg total) by mouth 2 (two) times daily. 10/02/19  Yes Biagio Borg, MD  AZO-CRANBERRY PO Take 1 tablet by mouth daily as needed (urinary symptoms).    Yes [provider]  B Complex Vitamins (VITAMIN B COMPLEX PO) Take 1 tablet by mouth daily.   Yes [provider]  budesonide-formoterol (SYMBICORT) 160-4.5 MCG/ACT inhaler Inhale 2 puffs into the lungs 2 (two) times daily. 10/22/19  Yes Biagio Borg, MD  cholecalciferol (VITAMIN D3) 25 MCG (1000 UNIT) tablet Take 2,000 Units by mouth daily.   Yes [provider]  diphenhydrAMINE (BENADRYL) 25 MG tablet Take 25 mg by mouth every 6 (six) hours as needed for allergies.   Yes [provider]  hydrOXYzine (ATARAX/VISTARIL) 25 MG tablet Take 25 mg by mouth 3 (three) times daily as needed for itching.   Yes [provider]  OXYGEN Inhale 2-3 L/min into the lungs continuous.    Yes [provider]  potassium chloride SA (KLOR-CON) 20 MEQ tablet  Take 1 tablet (20 mEq total) by mouth 2 (two) times daily. 11/06/19  Yes Shelly Coss, MD  traMADol (ULTRAM) 50 MG tablet Take 1 tablet (50 mg total) by mouth daily as needed for moderate pain. Patient taking differently: Take 50 mg by mouth every 6 (six) hours as needed for moderate pain.  10/02/19  Yes Biagio Borg, MD  vitamin C (ASCORBIC ACID) 500 MG tablet Take 500 mg by mouth daily.    Yes [provider]  VITAMIN E PO Take 1,000 Units by mouth daily.    Yes [provider]  allopurinol (ZYLOPRIM) 300 MG tablet TAKE 1 TABLET DAILY Patient not taking: No sig reported 05/17/17   Biagio Borg, MD  amitriptyline (ELAVIL) 100 MG tablet Take 1 tablet (100 mg total) by mouth at bedtime. Patient not taking: Reported on 01/16/2020 06/13/19   Biagio Borg, MD  atorvastatin (LIPITOR) 40 MG tablet TAKE 1 TABLET DAILY Patient not taking: No sig reported 09/26/18   Lelon Perla, MD  clonazePAM (KLONOPIN) 0.5 MG tablet Take 1 tablet (0.5 mg total) by mouth 2 (two) times daily as needed for anxiety. Patient not taking: Reported on 12/20/2019 12/19/19   Biagio Borg, MD  DULoxetine (CYMBALTA) 30 MG capsule TAKE 2 CAPSULES (60 MG TOTAL) BY MOUTH AT BEDTIME. Patient not taking: Reported on 01/09/2020 12/17/19   Biagio Borg, MD  furosemide (LASIX) 40 MG tablet Take 1 tablet (40 mg total) by mouth 2 (two) times daily. Patient not taking: Reported on 12/20/2019 11/06/19   Shelly Coss, MD  levothyroxine (SYNTHROID) 200 MCG tablet Take 1 tablet (200 mcg total) by mouth daily. Annual appt due in Sept must see provider for future refills Patient not taking: Reported on 12/28/2019 12/13/19   Biagio Borg, MD  metoprolol tartrate (LOPRESSOR) 25 MG tablet Take 0.5 tablets (12.5 mg total) by mouth as needed (for heart rate above 120 at rest). Patient not taking: Reported on 01/05/2020 09/28/19   Lelon Perla, MD  omeprazole (PRILOSEC) 40 MG capsule Take 1 capsule (  40 mg total) by mouth  daily. Patient not taking: Reported on 01/10/2020 05/10/19   Domenic Moras, PA-C    Scheduled Meds: . allopurinol  50 mg Oral Daily  . apixaban  5 mg Oral BID  . arformoterol  15 mcg Nebulization BID  . budesonide (PULMICORT) nebulizer solution  0.25 mg Nebulization BID  . famotidine  20 mg Oral Daily  . feeding supplement (ENSURE ENLIVE)  237 mL Oral TID BM  . ferrous sulfate  325 mg Oral Q breakfast  . folic acid  1 mg Oral Daily  . free water  200 mL Oral Q4H  . lactulose  30 g Oral QID  . levothyroxine  200 mcg Oral Q0600  . midodrine  10 mg Oral TID WC  . octreotide  200 mcg Subcutaneous Q12H  . rifaximin  550 mg Oral BID  . sodium chloride flush  10-40 mL Intracatheter Q12H  . thiamine  100 mg Oral Daily  . vitamin B-12  1,000 mcg Oral Daily   Infusions: . sodium chloride 250 mL (01/12/20 2236)  . albumin human 25 g (01/13/20 0646)  . cefTRIAXone (ROCEPHIN)  IV 2 g (01/12/20 2240)   PRN Meds: sodium chloride, acetaminophen **OR** acetaminophen, albuterol, dextrose, glycopyrrolate, ondansetron (ZOFRAN) IV, oxyCODONE   Allergies as of 12/22/2019 - Review Complete 01/14/2020  Allergen Reaction Noted  . Cephalexin Other (See Comments) 11/27/2013  . Ciprofloxacin Other (See Comments) 09/02/2016  . Lisinopril Cough   . Codeine    . Latex Dermatitis 11/01/2019  . Tape Dermatitis 11/01/2019  . Cleocin [clindamycin hcl] Rash 10/15/2014  . Dilaudid [hydromorphone] Other (See Comments) 06/09/2015  . Doxycycline Rash 09/21/2016  . Penicillins Rash 02/04/2014    Family History  Problem Relation Age of Onset  . Uterine cancer Mother   . Heart disease Mother   . Breast cancer Sister 21  . Ovarian cancer Sister   . Breast cancer Cousin 53  . Diabetes Brother   . Breast cancer Maternal Aunt   . Ovarian cancer Sister     Social History   Socioeconomic History  . Marital status: Divorced    Spouse name: Not on file  . Number of children: 2  . Years of education: Not on  file  . Highest education level: Not on file  Occupational History  . Occupation: Retired  Tobacco Use  . Smoking status: Former Smoker    Packs/day: 1.00    Years: 20.00    Pack years: 20.00    Types: Cigarettes    Quit date: 09/20/1998    Years since quitting: 21.3  . Smokeless tobacco: Never Used  . Tobacco comment: 1ppd x 20 years  Substance and Sexual Activity  . Alcohol use: Yes    Comment: rare, on holidays, and on special occasions per patient on 10/25/2019  . Drug use: No  . Sexual activity: Not Currently    Birth control/protection: Post-menopausal  Other Topics Concern  . Not on file  Social History Narrative   Lives alone in an apartment on the first floor.   Has 2 children.  Retired Advertising copywriter.  Education: some college.    Social Determinants of Health   Financial Resource Strain: Low Risk   . Difficulty of Paying Living Expenses: Not very hard  Food Insecurity: No Food Insecurity  . Worried About Charity fundraiser in the Last Year: Never true  . Ran Out of Food in the Last Year: Never true  Transportation Needs: No Transportation  Needs  . Lack of Transportation (Medical): No  . Lack of Transportation (Non-Medical): No  Physical Activity: Inactive  . Days of Exercise per Week: 0 days  . Minutes of Exercise per Session: 0 min  Stress: No Stress Concern Present  . Feeling of Stress : Only a little  Social Connections: Slightly Isolated  . Frequency of Communication with Friends and Family: More than three times a week  . Frequency of Social Gatherings with Friends and Family: More than three times a week  . Attends Religious Services: More than 4 times per year  . Active Member of Clubs or Organizations: Yes  . Attends Archivist Meetings: More than 4 times per year  . Marital Status: Divorced  Human resources officer Violence: Not At Risk  . Fear of Current or Ex-Partner: No  . Emotionally Abused: No  . Physically Abused: No  . Sexually  Abused: No    REVIEW OF SYSTEMS: Constitutional: Patient is confused and I am unable to get a history from the patient.    PHYSICAL EXAM: Vital signs in last 24 hours: Vitals:   01/13/20 0805 01/13/20 0808  BP:    Pulse:    Resp:    Temp:    SpO2: 96% 95%   Wt Readings from Last 3 Encounters:  01/12/20 128.9 kg  11/23/19 125.8 kg  11/06/19 126.6 kg    General: Morbidly obese, ill-appearing.  Confused, alert.  Looks comfortable Head: No facial asymmetry or swelling. Eyes: Conjunctiva pale.  No scleral icterus. Ears: She seems to hear me but does not always respond appropriately. Nose: No discharge Mouth: Patient did not open her mouth for me despite several prompts.  Mucosa appears moist and pink, no visible blood Neck: No JVD, no masses. Lungs: No labored breathing or cough.  Lungs clear but diminished breath sounds Heart: RRR.  No MRG. Abdomen: Obese without tenderness.  Soft.  No fluid wave.  No organomegaly.  There is a superficial, subcutaneous round firmness at the superior aspect of a long midline abdominal scar which is well-healed..   Rectal: Deferred Musc/Skeltl: No obvious joint deformities. Extremities: Skin protective dressing on the left thigh.  Both lower legs are in pressure protecting booties.  Edema bilaterally in the arms and legs. Neurologic: Not oriented other than to her name which she can tell me.  Speech is somewhat slurred.  Minimal following of commands.  Not able to elicit asterixis.  No tremors.   Intake/Output from previous day: 04/24 0701 - 04/25 0700 In: 170.5 [I.V.:2.4; IV Piggyback:168.1] Out: -  Intake/Output this shift: Total I/O In: -  Out: 500 [Urine:500]  LAB RESULTS: Recent Labs    01/11/20 0719 01/12/20 0620  WBC 5.8 5.3  HGB 9.5* 9.2*  HCT 32.9* 32.4*  PLT 215 224   BMET Lab Results  Component Value Date   NA 146 (H) 01/12/2020   NA 146 (H) 01/11/2020   NA 145 01/10/2020   K 4.4 01/12/2020   K 4.4 01/11/2020    K 4.4 01/10/2020   CL 105 01/12/2020   CL 106 01/11/2020   CL 106 01/10/2020   CO2 31 01/12/2020   CO2 32 01/11/2020   CO2 30 01/10/2020   GLUCOSE 95 01/12/2020   GLUCOSE 103 (H) 01/11/2020   GLUCOSE 88 01/10/2020   BUN 94 (H) 01/12/2020   BUN 91 (H) 01/11/2020   BUN 91 (H) 01/10/2020   CREATININE 2.08 (H) 01/12/2020   CREATININE 1.97 (H) 01/11/2020   CREATININE  2.08 (H) 01/10/2020   CALCIUM 9.6 01/12/2020   CALCIUM 9.6 01/11/2020   CALCIUM 9.6 01/10/2020   LFT Recent Labs    01/11/20 0719 01/12/20 0620  PROT 6.2* 6.4*  ALBUMIN 2.7* 2.7*  AST 69* 59*  ALT 60* 59*  ALKPHOS 305* 323*  BILITOT 1.1 0.8   PT/INR Lab Results  Component Value Date   INR 1.5 (H) 01/02/2020   INR 1.7 (H) 11/01/2019   INR 1.08 09/02/2016   Hepatitis Panel No results for input(s): HEPBSAG, HCVAB, HEPAIGM, HEPBIGM in the last 72 hours. C-Diff No components found for: CDIFF Lipase     Component Value Date/Time   LIPASE 19 01/14/2020 1007    Drugs of Abuse  No results found for: LABOPIA, COCAINSCRNUR, LABBENZ, AMPHETMU, THCU, LABBARB   RADIOLOGY STUDIES: DG CHEST PORT 1 VIEW  Result Date: 01/12/2020 CLINICAL DATA:  Hypoxia EXAM: PORTABLE CHEST 1 VIEW COMPARISON:  January 09, 2020 FINDINGS: Stable cardiomegaly. Stable pacemaker. The right IJ is been removed. No pneumothorax. No nodules or masses. No focal infiltrates or overt edema. IMPRESSION: Cardiomegaly.  No acute abnormalities noted. Electronically Signed   By: Dorise Bullion III M.D   On: 01/12/2020 13:56   [place] ASCITES (ABDOMEN LIMITED)  Result Date: 01/12/2020 CLINICAL DATA:  Ascites EXAM: LIMITED ABDOMEN ULTRASOUND FOR ASCITES TECHNIQUE: Limited ultrasound survey for ascites was performed in all four abdominal quadrants. COMPARISON:  Renal sonogram of 01/15/2020 and recent CT of the abdomen pelvis. FINDINGS: Imaging was performed in all 4 quadrants. Small to moderate volume of ascites was noted greatest in RIGHT lower and RIGHT upper  quadrant. Smaller volume in LEFT lower quadrant. Coarse hepatic echotexture incidentally noted. IMPRESSION: Small moderate volume ascites. Nodular contour of the liver and coarsened hepatic echotexture remains suspicious for cirrhosis Electronically Signed   By: Zetta Bills M.D.   On: 01/12/2020 18:15     IMPRESSION:   *   New diagnosis cirrhosis of the liver in patient with hepatic steatosis, possible hepatocellular disease on ultrasound 2015. Acute hepatitis serologies negative including HCV antibodies Do not have hepatitis B surface antibody levels.  *   Hepatic encephalopathy.  1 large bowel movement after lactulose enema last night but none overnight ordered today despite regular doses of lactulose and day 2 bid rifaximin.   *    Ascites.  Diuretic management of this complicated by kidney disease, Dr. Jonnie Finner states "more aggressive diuresis would be too high risk for worsening AKA.  Fluids not an option given anasarca.  Patient is not a dialysis candidate given severe comorbidities.  Treating with HRS protocol now with midodrine/octreotide/IV albumin.  Have stopped Lasix due to concern over possible HRS".   *    Multiple acute and chronic medical issues including current admission for rhabdo, hypoxic/hypercarbic respiratory failure, acute on chronic kidney disease, anemia, a fib/chronic Eliquuis not on hold   PLAN:     *   Hep B surface Ab, Hep A total Ab to determine vaccination needs.    *   Pt is DNR.    *    Pursue attempt at paracentesis to r/o SBP??  Before ordering this would like to have a more recent INR, so ordering this for the morning  *     Continue the rifaximin as well as the high-dose lactulose at 30 g qid   Azucena Freed  01/13/2020, 2:18 PM Phone [phone]   Attending physician's note   I have taken an interval history, reviewed the  chart and examined the patient. I agree with the Advanced Practitioner's note, impression and recommendations.   Newly  Dx NASH decompensated liver cirrhosis with portal hypertension Hepatic encephalopathy likely ppt by overdiuresis.  No definite infection. Ascites Acute on chronic kidney disease. Likely prerenal, doubt HRS. Appreciate renal consultation A. fib on Eliquis (on hold) Multiple comorbid conditions including morbid obesity, chronic respiratory failure requiring BiPAP with dCHF/R HF, rhabdo. DNR  Plan: -2 g sodium diet -Now that she is able to take p.o., lactulose 30g QID until first large BM, then titrate to 2-3 softer BMs/day. -Rifaximin 550 mg p.o. twice daily -Midodrine/octreotide/IV albumin. -US guided diagnostic/therapeutic paracentesis tomorrow ( to be ordered in a.m.) -Not a candidate for EGD d/t multiple comorbid conditions. -Will follow along.  Dr. Bryan Lemma covering GI from tomorrow onwards.    Carmell Austria, MD Velora Heckler Fabienne Bruns [phone].

## 2020-01-14 ENCOUNTER — Inpatient Hospital Stay (HOSPITAL_COMMUNITY): Payer: Medicare HMO

## 2020-01-14 DIAGNOSIS — R400 Somnolence: Secondary | ICD-10-CM | POA: Diagnosis not present

## 2020-01-14 DIAGNOSIS — J9621 Acute and chronic respiratory failure with hypoxia: Secondary | ICD-10-CM | POA: Diagnosis not present

## 2020-01-14 DIAGNOSIS — K7290 Hepatic failure, unspecified without coma: Secondary | ICD-10-CM

## 2020-01-14 DIAGNOSIS — Z515 Encounter for palliative care: Secondary | ICD-10-CM | POA: Diagnosis not present

## 2020-01-14 DIAGNOSIS — R404 Transient alteration of awareness: Secondary | ICD-10-CM | POA: Diagnosis not present

## 2020-01-14 DIAGNOSIS — K7460 Unspecified cirrhosis of liver: Secondary | ICD-10-CM

## 2020-01-14 DIAGNOSIS — K7581 Nonalcoholic steatohepatitis (NASH): Secondary | ICD-10-CM

## 2020-01-14 LAB — BLOOD GAS, VENOUS
Acid-Base Excess: 3 mmol/L — ABNORMAL HIGH (ref 0.0–2.0)
Acid-Base Excess: 4.5 mmol/L — ABNORMAL HIGH (ref 0.0–2.0)
Bicarbonate: 30.9 mmol/L — ABNORMAL HIGH (ref 20.0–28.0)
Bicarbonate: 31.3 mmol/L — ABNORMAL HIGH (ref 20.0–28.0)
Drawn by: 4029
FIO2: 100
FIO2: 50
O2 Saturation: 98.8 %
O2 Saturation: 71.6 %
Patient temperature: 37
Patient temperature: 36.3
pCO2, Ven: 87.7 mmHg (ref 44.0–60.0)
pCO2, Ven: 72.1 mmHg (ref 44.0–60.0)
pH, Ven: 7.172 — CL (ref 7.250–7.430)
pH, Ven: 7.256 (ref 7.250–7.430)
pO2, Ven: 144 mmHg — ABNORMAL HIGH (ref 32.0–45.0)
pO2, Ven: 42.9 mmHg (ref 32.0–45.0)

## 2020-01-14 LAB — MAGNESIUM: Magnesium: 2.7 mg/dL — ABNORMAL HIGH (ref 1.7–2.4)

## 2020-01-14 LAB — CBC WITH DIFFERENTIAL/PLATELET
Abs Immature Granulocytes: 0.1 10*3/uL — ABNORMAL HIGH (ref 0.00–0.07)
Basophils Absolute: 0 10*3/uL (ref 0.0–0.1)
Basophils Relative: 1 %
Eosinophils Absolute: 0.1 10*3/uL (ref 0.0–0.5)
Eosinophils Relative: 1 %
HCT: 32.8 % — ABNORMAL LOW (ref 36.0–46.0)
Hemoglobin: 9.3 g/dL — ABNORMAL LOW (ref 12.0–15.0)
Immature Granulocytes: 2 %
Lymphocytes Relative: 17 %
Lymphs Abs: 1 10*3/uL (ref 0.7–4.0)
MCH: 30.3 pg (ref 26.0–34.0)
MCHC: 28.4 g/dL — ABNORMAL LOW (ref 30.0–36.0)
MCV: 106.8 fL — ABNORMAL HIGH (ref 80.0–100.0)
Monocytes Absolute: 0.8 10*3/uL (ref 0.1–1.0)
Monocytes Relative: 13 %
Neutro Abs: 4.1 10*3/uL (ref 1.7–7.7)
Neutrophils Relative %: 66 %
Platelets: 240 10*3/uL (ref 150–400)
RBC: 3.07 MIL/uL — ABNORMAL LOW (ref 3.87–5.11)
RDW: 19.7 % — ABNORMAL HIGH (ref 11.5–15.5)
WBC: 6.2 10*3/uL (ref 4.0–10.5)
nRBC: 4.5 % — ABNORMAL HIGH (ref 0.0–0.2)

## 2020-01-14 LAB — PROTIME-INR
INR: 1.8 — ABNORMAL HIGH (ref 0.8–1.2)
Prothrombin Time: 20.6 seconds — ABNORMAL HIGH (ref 11.4–15.2)

## 2020-01-14 LAB — COMPREHENSIVE METABOLIC PANEL
ALT: 48 U/L — ABNORMAL HIGH (ref 0–44)
AST: 54 U/L — ABNORMAL HIGH (ref 15–41)
Albumin: 3.7 g/dL (ref 3.5–5.0)
Alkaline Phosphatase: 331 U/L — ABNORMAL HIGH (ref 38–126)
Anion gap: 15 (ref 5–15)
BUN: 101 mg/dL — ABNORMAL HIGH (ref 8–23)
CO2: 29 mmol/L (ref 22–32)
Calcium: 10 mg/dL (ref 8.9–10.3)
Chloride: 104 mmol/L (ref 98–111)
Creatinine, Ser: 2.19 mg/dL — ABNORMAL HIGH (ref 0.44–1.00)
GFR calc Af Amer: 25 mL/min — ABNORMAL LOW (ref >60)
GFR calc non Af Amer: 22 mL/min — ABNORMAL LOW (ref >60)
Glucose, Bld: 90 mg/dL (ref 70–99)
Potassium: 5 mmol/L (ref 3.5–5.1)
Sodium: 148 mmol/L — ABNORMAL HIGH (ref 135–145)
Total Bilirubin: 1.3 mg/dL — ABNORMAL HIGH (ref 0.3–1.2)
Total Protein: 7 g/dL (ref 6.5–8.1)

## 2020-01-14 LAB — GLUCOSE, CAPILLARY
Glucose-Capillary: 71 mg/dL (ref 70–99)
Glucose-Capillary: 83 mg/dL (ref 70–99)
Glucose-Capillary: 89 mg/dL (ref 70–99)
Glucose-Capillary: 91 mg/dL (ref 70–99)
Glucose-Capillary: 92 mg/dL (ref 70–99)
Glucose-Capillary: 93 mg/dL (ref 70–99)
Glucose-Capillary: 96 mg/dL (ref 70–99)

## 2020-01-14 LAB — HEPATITIS B SURFACE ANTIBODY,QUALITATIVE: Hep B S Ab: NONREACTIVE

## 2020-01-14 LAB — AMMONIA: Ammonia: 84 umol/L — ABNORMAL HIGH (ref 9–35)

## 2020-01-14 LAB — HEPATITIS A ANTIBODY, TOTAL: hep A Total Ab: REACTIVE — AB

## 2020-01-14 LAB — PHOSPHORUS: Phosphorus: 4.8 mg/dL — ABNORMAL HIGH (ref 2.5–4.6)

## 2020-01-14 MED ORDER — DEXTROSE-NACL 5-0.45 % IV SOLN: INTRAVENOUS | Status: DC

## 2020-01-14 MED ORDER — DEXTROSE 10 % IV SOLN: INTRAVENOUS | Status: DC

## 2020-01-14 MED ORDER — LACTULOSE ENEMA
300.0000 mL | Freq: Four times a day (QID) | ORAL | Status: DC
  Administered 2020-01-14: 300 mL via RECTAL
  Filled 2020-01-14 (×3): qty 300

## 2020-01-14 NOTE — Progress Notes (Signed)
Patient ID: [name] [name], female   DOB: 20-Apr-1946, 74 y.o.   MRN: [id number]  This NP visited patient at the bedside for palliative medicine needs, emotional support and to meet with daughter/[name] as scheduled for continued conversation regarding current medical situation.  Patient currently on BiPAP and responsive only to painful stimuli.  Education offered regarding current medical situation: diagnosis, prognosis, current treatments/BiPAP, input and recommendations from specialties/gastroenterology.  We discussed the patient's multiple comorbidities and long-term poor prognosis.  Dr. Florene Glen came to the bedside and offered further information regarding current medical situation.    Education regarding the difference between a continued aggressive medicalal intervtenions path and palliaitve comfort path.  Education offered regarding the natural trajectory and expectations at end of life.  Attempted to elicit values and goals of care important to the patient and family.  Created space and opportunity for daughter to explore her thoughts and feelings regarding patient's current medical situation.  Needed reports that the patient's son will be arriving tonight at 11:00 and that no decisions to shift from current treatment plan will be made until son is able to visit.  [name] [name] does verbalize an understanding of the seriousness of the situation and the likely long-term poor prognosis.  She is leaning towards a more comfort path.  We discussed hospice benefit specifically to residential inpatient unit.  Discussed with daughter the importance of continued conversation with her family and the  medical providers regarding overall plan of care and treatment options,  ensuring decisions are within the context of the patients values and GOCs.  Questions and concerns addressed   Discussed with Dr Florene Glen  Total time spent on the unit was 35 minutes  Greater than 50% of the time was spent in counseling and  coordination of care  Wadie Lessen NP  Palliative Medicine Team Team Phone # [phone] Pager [phone]

## 2020-01-14 NOTE — Care Management Important Message (Signed)
Important Message  Patient Details  Name: [name] [name] MRN: [id number] Date of Birth: March 26, 1946   Medicare Important Message Given:  Yes     Shelda Altes 01/14/2020, 8:52 AM

## 2020-01-14 NOTE — Progress Notes (Signed)
PROGRESS NOTE    [name] [name]  SAY:[id number] DOB: 1946-03-30 DOA: 01/06/2020 PCP: Biagio Borg, MD   Chief Complaint  Patient presents with  . Fall    Brief Narrative:  74 yo F PMH OSA, COPD on 2LNC, diastolic CHF, Afib, CKD III, SSS s/p PPM, PFO, hypothyroidism, depression, breast cancer s/p chemo and morbid obesity who presented to ED 4/10 with AMS after being found down at home. EMS was dispatched to patient's home after neighbors noticed mail was full. Upon EMS arrival, pt was hypoxic SpO2 80% on RA and had pressure ulcers on R side of body, and endorsed global myalgia. Patient reported that she sustained fall approx 6 days prior to EMS dispatch and was unable to stand self.   In ED, pt hypoxic but VBG not overtly hypercarbic. CK 2000, Cr 2.48. Admitted to [facility].  On 4/15, patient with progressively worsening resp status, as well as complex medical picture including rhabdo/AKI s/p fluid restriction due to pulm status, elevated BNP despite fluid restriction and ongoing diuresis.  Transfer to ICU.  She was put on intermittent BiPAP.  Diuresed with IV Lasix with improvement in her breathing.  She was transferred back to [facility] on 4/20.  Assessment & Plan:   Active Problems:   LFT elevation   AMS (altered mental status)   Non-traumatic rhabdomyolysis   Acute on chronic respiratory failure with hypoxia and hypercapnia (HCC)   Non-pressure chronic ulcer of other part of right foot limited to breakdown of skin ([place])   Fall   Moderate protein-calorie malnutrition ([place])   Idiopathic chronic gout without tophus   Acute respiratory failure with hypoxia ([place])   Palliative care by specialist   DNR (do not resuscitate)   Weakness generalized   Hepatic encephalopathy ([place])  Acute metabolic encephalopathy:  Multifactorial including respiratory failure.   TSH 6.63.  Free T4 normal.  B12 5.7K.  No obvious signs of infection.  No focal neuro deficit.  CT head without acute finding.  Ammonia  rising, 152 on 4/24 -> 80's today.  Hypercarbia with acidemia.  She continues to be significantly lethargic today.  Seems like her encephalopathy is related to hypercarbia and hepatic encephalopathy at this point. - worsened today compared to yesterday - she was not on bipap last night, pt refused and then placed on NRB - will place on bipap now and follow repeat blood gas - continue lactulose (unable to take PO at this time, follow VBG - may need to restart lactulose enemas) - Frequent orientation and delirium precautions.  Acute on chronic RF with hypoxia and hypercarbia: Multifactorial including OSA/possible OHS, COPD and PFO.  Not sure if she is compliant with CPAP at home.  On 2 L at baseline. -currently on NRB -> will place back on bipap with AMS -Minimum oxygen to maintain saturation above 88% -needs bipap nightly -Continue Brovana, pulmicort, prn albuterol nebs -CXR with mild central pulm vascular congestion -repeat CXR today  Acute on chronic diastolic heart failure: Echo on 4/11 with EF of 55 to 60%, no RWMA, indeterminate diastolic dysfunction, moderate LAE, severe RAE moderate TVR, moderate AVR and RVSP of 46.6.  BNP 2000 (higher than baseline).  Still with significant fluid overload clinically.  UOP 1 L 4/23 (foley now out).  Weight down. - lasix and spiro on hold right now with renal function -Midodrine 10 mg 3 times daily for soft blood pressures -Strict intake and output -Sodium restriction -Repeat BNP elevated -Renal has been c/s, appreciate recs  AKI on CKD-3a/azotemia: Baseline Cr ~1.4> 2.48 (admit)> 2.61 (peak)>> 2.19 4/25, fluctuating with diuresis.  Renal ultrasound without significant finding.  She could be [name] [name] combination of prerenal insult in the setting of dehydration and possible pigment nephropathy from rhabdo.  Still with significant fluid overload on exam. - no labs from today difficult to draw with edema - nephrology has been c/s - suspects AKI hemodynamic 2/2  diuretics with intravasc depletion + soft BP's with hypoperfusion + poss hepatorenal.  Other poss includes chronic HF causing anasarca.  Pt not dialysis candidate.  For now, treating with HRS protocol.  Lasix has been held.    Hypervolemic hypernatremia: Na 147 (admit)> 156 (peak)> 148 -appreciate renal assistance -not alert enough to take PO at this time  Fall at home Debility/physical deconditioning Traumatic rhabdomyolysis-resolved. -PT/OT recommended SNF.  Hypoglycemia: Likely due to poor Po p.o. intake. - d/c D10 and follow POC BG's  Paroxysmal Cortasia Screws. Fib: Now in sinus rhythm without medications.  CHA2DS2-VASc score greater than 3. -Continue Eliquis for anticoagulation for now.  Need to discuss long term risk and benefit given risk of fall.  SSS s/p PPM-stable.  Macrocytic anemia: B12 level high, folate wnl.  H&H stable. - ferritin low normal, overall labs wnl.  Hypoproliferative retics. - will start PO iron  Hypothyroidism: TSH 6.6. -Continue home Synthroid  Thrombocytopenia: Improving. -Continue monitoring  Dysphagia -Appreciate SLP input-dysphagia 2 diet.  Morbid obesity:Body mass index is 45.23 kg/m. -Challenging issue given patient's comorbidity and immobility.  History of gout: Uric acid elevated but patient denies pain.  Could be due to renal failure. - Follow uric acid intermittently  -restart allopurinol, low dose with aki  Mood Disorder: med rec notes she's not taking many of her psych meds.  Currently still has AMS.  Palliative recommending psych c/s, will continue to monitor - consider if mental status improves Abdimalik Mayorquin bit more.  Goal of care discussion: Patient with significant comorbidities and debility as above.  She is Rashaun Wichert still full code.  Per previous provider "I do not believe patient has capacity to make reasonable medical decision.  I discussed with patient's daughter, [name] who states she is the only close family.  [name] is working to  [employer] and  guardianship paper.  After discussion about patient's comorbidity, quality of life, CODE STATUS and pros and cons of CPR and intubation, [name] requested for CODE STATUS to be changed to DNR/DNI."  -CODE STATUS changed to DNR/DNI -Palliative care consulted  Nutrition Nutrition Problem: Inadequate oral intake Etiology: poor appetite  Signs/Symptoms: meal completion < 50%  Interventions: Prostat, Hormel Shake  R sided skin breakdown, present on admission-no signs of infection. -wound care, frequent turns.  RIJ central line Discontinue central line, establish PIV (central line still in place today, will discuss removal with RN)  Foley in place:  Will d/c foley at this time  Elevated LFT's: mild, follow acute hepatitis panel, continue to monitor.  RUQ [place] 4/21 with sludge and stones with nonspecific gallbladder wall thickening (2/2 acute vs chronic cholecystitis, hepatic disease, or edema forming states).  Echogenic liver.  ? Cirrhosis. Follow inr.  Normal bili, platelets.  Albumin 2.7. [place] for ascites shows small mod volume.  On abx with her encephalopathy to cover SBP - with poor prognosis, discussed with daughter paracentesis - as considering goc, palliative care at this point, will hold off for now  DVT prophylaxis: eliquis Code Status: DNR Family Communication: none at bedside, daughter over phone 4/25 Disposition:   Status is: Inpatient  Remains inpatient appropriate because:Altered mental status, IV treatments appropriate due to intensity of illness or inability to take PO, Inpatient level of care appropriate due to severity of illness and continued need for IV diuresis, monitoring of renal function in setting of volume overload   Dispo: The patient is from: Home              Anticipated d/c is to: SNF              Anticipated d/c date is: 3 days              Patient currently is not medically stable to d/c.  Consultants:   none  Procedures:  Echo IMPRESSIONS     1. Limted bubble study Bi atrial enlargement with pacing wire in RA/RV.  Mobile redundant atrial septum with positive bubble study for right to  left shunt fairly large amount of bubbles crossing septum.   Echo IMPRESSIONS    1. Left ventricular ejection fraction, by estimation, is 55 to 60%. The  left ventricle has normal function. The left ventricle has no regional  wall motion abnormalities. Left ventricular diastolic parameters are  indeterminate.  2. Right ventricular systolic function is normal. The right ventricular  size is normal. There is moderately elevated pulmonary artery systolic  pressure.  3. Left atrial size was moderately dilated.  4. Right atrial size was severely dilated.  5. The mitral valve is grossly normal. Mild to moderate mitral valve  regurgitation.  6. Tricuspid valve regurgitation is moderate.  7. The aortic valve is tricuspid. Aortic valve regurgitation is moderate.  Mild aortic valve stenosis.   LE [place] Summary:  RIGHT:  - Findings appear essentially unchanged compared to previous examination.  - There is no evidence of deep vein thrombosis in the lower extremity.  However, portions of this examination were limited- see technologist  comments above.    LEFT:  - There is no evidence of deep vein thrombosis in the lower extremity.  However, portions of this examination were limited- see technologist  comments above.   UE [place] Summary:    Right:  No evidence of deep vein thrombosis in the upper extremity. However,  unable to  visualize the axillary, cephalic, and basilic veins.    Left:  No evidence of deep vein thrombosis in the upper extremity. However,  unable to  visualize the cephalic, basilic, and internal jugular veins.    Antimicrobials:  Anti-infectives (From admission, onward)   Start     Dose/Rate Route Frequency Ordered Stop   01/12/20 2200  rifaximin (XIFAXAN) tablet 550 mg     550 mg Oral 2 times daily  01/12/20 1803     01/12/20 1930  cefTRIAXone (ROCEPHIN) 2 g in sodium chloride 0.9 % 100 mL IVPB     2 g 200 mL/hr over 30 Minutes Intravenous Every 24 hours 01/12/20 1841     01/02/20 0600  vancomycin (VANCOREADY) IVPB 1250 mg/250 mL  Status:  Discontinued     1,250 mg 166.7 mL/hr over 90 Minutes Intravenous Every 48 hours 12/31/19 0801 01/01/20 0757   12/31/19 1700  cefTRIAXone (ROCEPHIN) 2 g in sodium chloride 0.9 % 100 mL IVPB  Status:  Discontinued     2 g 200 mL/hr over 30 Minutes Intravenous Every 24 hours 12/31/19 1250 01/01/20 0757   12/31/19 1000  aztreonam (AZACTAM) 1 g in sodium chloride 0.9 % 100 mL IVPB  Status:  Discontinued     1 g 200 mL/hr over  30 Minutes Intravenous Every 8 hours 12/31/19 0801 12/31/19 1250   12/31/19 0900  vancomycin (VANCOREADY) IVPB 2000 mg/400 mL     2,000 mg 200 mL/hr over 120 Minutes Intravenous  Once 12/31/19 0801 12/31/19 1221   12/31/19 0800  metroNIDAZOLE (FLAGYL) IVPB 500 mg  Status:  Discontinued     500 mg 100 mL/hr over 60 Minutes Intravenous Every 8 hours 12/31/19 0753 01/01/20 0757      Subjective: Lethargic   Objective: Vitals:   01/14/20 0405 01/14/20 0500 01/14/20 0734 01/14/20 0857  BP: (!) 101/58     Pulse:   81   Resp: (!) 25 (!) 25    Temp: 98.1 F (36.7 C)  98.2 F (36.8 C)   TempSrc: Oral  Oral   SpO2: 100%  97% 96%  Weight: 130.2 kg     Height:        Intake/Output Summary (Last 24 hours) at 01/14/2020 0907 Last data filed at 01/14/2020 0700 Gross per 24 hour  Intake --  Output 700 ml  Net -700 ml   Filed Weights   01/11/20 0430 01/12/20 0004 01/14/20 0405  Weight: 131.6 kg 128.9 kg 130.2 kg    Examination:  General: No acute distress. Cardiovascular: Heart sounds show Manal Kreutzer regular rate, and rhythm.  Lungs: Clear to auscultation bilaterally Abdomen: Soft, nontender, nondistended Neurological: lethargic, awakes slightly to sternal rub, but does not say anything.  . Cranial nerves II through XII grossly  intact. Skin: Warm and dry. No rashes or lesions. Extremities: diffuse edema   Data Reviewed: I have personally reviewed following labs and imaging studies  CBC: Recent Labs  Lab 01/09/20 0438 01/10/20 0423 01/11/20 0719 01/12/20 0620 01/14/20 0635  WBC 5.4 5.6 5.8 5.3 6.2  NEUTROABS  --   --   --  4.0 4.1  HGB 9.4* 9.2* 9.5* 9.2* 9.3*  HCT 33.3* 31.7* 32.9* 32.4* 32.8*  MCV 105.0* 102.6* 103.1* 105.9* 106.8*  PLT 153 178 215 224 283    Basic Metabolic Panel: Recent Labs  Lab 01/09/20 0438 01/10/20 0423 01/11/20 0719 01/12/20 0620 01/14/20 0635  NA 146* 145 146* 146* 148*  K 4.3 4.4 4.4 4.4 5.0  CL 107 106 106 105 104  CO2 29 30 32 31 29  GLUCOSE 125* 88 103* 95 90  BUN 95* 91* 91* 94* 101*  CREATININE 2.17* 2.08* 1.97* 2.08* 2.19*  CALCIUM 9.5 9.6 9.6 9.6 10.0  MG 2.3 2.3 2.3 2.4 2.7*  PHOS 4.1 3.7  --  4.7* 4.8*    GFR: Estimated Creatinine Clearance: 32.1 mL/min ([name] [name]) (by C-G formula based on SCr of 2.19 mg/dL (H)).  Liver Function Tests: Recent Labs  Lab 01/09/20 0726 01/10/20 0423 01/11/20 0719 01/12/20 0620 01/14/20 0635  AST 32 78* 69* 59* 54*  ALT 30 49* 60* 59* 48*  ALKPHOS 208* 273* 305* 323* 331*  BILITOT 0.7 0.8 1.1 0.8 1.3*  PROT 6.0* 6.1* 6.2* 6.4* 7.0  ALBUMIN 2.6* 2.6*  2.6* 2.7* 2.7* 3.7    CBG: Recent Labs  Lab 01/13/20 1715 01/13/20 2013 01/14/20 0055 01/14/20 0448 01/14/20 0733  GLUCAP 107* 88 92 93 96     Recent Results (from the past 240 hour(s))  Respiratory Panel by RT PCR (Flu [name] [name]&B, Covid) - Nasopharyngeal Swab     Status: None   Collection Time: 01/09/20  2:41 PM   Specimen: Nasopharyngeal Swab  Result Value Ref Range Status   SARS Coronavirus 2 by RT PCR NEGATIVE NEGATIVE Final  Comment: (NOTE) SARS-CoV-2 target nucleic acids are NOT DETECTED. The SARS-CoV-2 RNA is generally detectable in upper respiratoy specimens during the acute phase of infection. The lowest concentration of SARS-CoV-2 viral copies this  assay can detect is 131 copies/mL. [name] [name] negative result does not preclude SARS-Cov-2 infection and should not be used as the sole basis for treatment or other patient management decisions. Kari Montero negative result may occur with  improper specimen collection/handling, submission of specimen other than nasopharyngeal swab, presence of viral mutation(s) within the areas targeted by this assay, and inadequate number of viral copies (<131 copies/mL). [name] [name] negative result must be combined with clinical observations, patient history, and epidemiological information. The expected result is Negative. Fact Sheet for Patients:  PinkCheek.be Fact Sheet for Healthcare Providers:  GravelBags.it This test is not yet ap proved or cleared by the [place] FDA and  has been authorized for detection and/or diagnosis of SARS-CoV-2 by FDA under an Emergency Use Authorization (EUA). This EUA will remain  in effect (meaning this test can be used) for the duration of the COVID-19 declaration under Section 564(b)(1) of the Act, 21 U.S.C. section 360bbb-3(b)(1), unless the authorization is terminated or revoked sooner.    Influenza [name] [name] by PCR NEGATIVE NEGATIVE Final   Influenza B by PCR NEGATIVE NEGATIVE Final    Comment: (NOTE) The Xpert Xpress SARS-CoV-2/FLU/RSV assay is intended as an aid in  the diagnosis of influenza from Nasopharyngeal swab specimens and  should not be used as [name] [name] sole basis for treatment. Nasal washings and  aspirates are unacceptable for Xpert Xpress SARS-CoV-2/FLU/RSV  testing. Fact Sheet for Patients: PinkCheek.be Fact Sheet for Healthcare Providers: GravelBags.it This test is not yet approved or cleared by the [place] FDA and  has been authorized for detection and/or diagnosis of SARS-CoV-2 by  FDA under an Emergency Use Authorization (EUA). This EUA will remain  in  effect (meaning this test can be used) for the duration of the  Covid-19 declaration under Section 564(b)(1) of the Act, 21  U.S.C. section 360bbb-3(b)(1), unless the authorization is  terminated or revoked. Performed at [facility], [place] [address] [place], [place] [address]          Radiology Studies: DG CHEST PORT 1 VIEW  Result Date: 01/12/2020 CLINICAL DATA:  Hypoxia EXAM: PORTABLE CHEST 1 VIEW COMPARISON:  January 09, 2020 FINDINGS: Stable cardiomegaly. Stable pacemaker. The right IJ is been removed. No pneumothorax. No nodules or masses. No focal infiltrates or overt edema. IMPRESSION: Cardiomegaly.  No acute abnormalities noted. Electronically Signed   By: Dorise Bullion III M.D   On: 01/12/2020 13:56   [place] ASCITES (ABDOMEN LIMITED)  Result Date: 01/12/2020 CLINICAL DATA:  Ascites EXAM: LIMITED ABDOMEN ULTRASOUND FOR ASCITES TECHNIQUE: Limited ultrasound survey for ascites was performed in all four abdominal quadrants. COMPARISON:  Renal sonogram of 01/04/2020 and recent CT of the abdomen pelvis. FINDINGS: Imaging was performed in all 4 quadrants. Small to moderate volume of ascites was noted greatest in RIGHT lower and RIGHT upper quadrant. Smaller volume in LEFT lower quadrant. Coarse hepatic echotexture incidentally noted. IMPRESSION: Small moderate volume ascites. Nodular contour of the liver and coarsened hepatic echotexture remains suspicious for cirrhosis Electronically Signed   By: Zetta Bills M.D.   On: 01/12/2020 18:15        Scheduled Meds: . allopurinol  50 mg Oral Daily  . apixaban  5 mg Oral BID  . arformoterol  15 mcg Nebulization BID  . budesonide (PULMICORT) nebulizer solution  0.25 mg Nebulization BID  . famotidine  20 mg Oral Daily  . feeding supplement (ENSURE ENLIVE)  237 mL Oral TID BM  . ferrous sulfate  325 mg Oral Q breakfast  . folic acid  1 mg Oral Daily  . free water  200 mL Oral Q4H  . lactulose  30 g Oral QID  . levothyroxine  200  mcg Oral Q0600  . midodrine  10 mg Oral TID WC  . octreotide  200 mcg Subcutaneous Q12H  . rifaximin  550 mg Oral BID  . sodium chloride flush  10-40 mL Intracatheter Q12H  . thiamine  100 mg Oral Daily  . vitamin B-12  1,000 mcg Oral Daily   Continuous Infusions: . sodium chloride 250 mL (01/13/20 2021)  . albumin human 25 g (01/14/20 0656)  . cefTRIAXone (ROCEPHIN)  IV 2 g (01/13/20 2022)     LOS: 16 days    Time spent: over 60 min    Fayrene Helper, MD [facility]   To contact the attending provider between 7A-7P or the covering provider during after hours 7P-7A, please log into the web site [email or web address] and access using universal [place] password for that web site. If you do not have the password, please call the hospital operator.  01/14/2020, 9:07 AM

## 2020-01-14 NOTE — Progress Notes (Addendum)
Progress Note   Subjective  Chief Complaint: Encephalopathy, cirrhosis  This morning patient is found on BiPAP and unable to answer questions, she is lethargic.  Per nursing staff she had a large bowel movement just prior to my visit with her.  No other acute changes.   Objective   Vital signs in last 24 hours: Temp:  [98.1 F (36.7 C)-98.6 F (37 C)] 98.2 F (36.8 C) (04/26 1125) Pulse Rate:  [77-94] 77 (04/26 1125) Resp:  [20-32] 21 (04/26 1118) BP: (98-114)/(58-71) 100/68 (04/26 1125) SpO2:  [88 %-100 %] 91 % (04/26 1125) FiO2 (%):  [50 %] 50 % (04/26 0917) Weight:  [130.2 kg] 130.2 kg (04/26 0405) Last BM Date: 01/13/20 General:    AA female in NAD Heart:  Regular rate and rhythm; no murmurs Lungs: Respirations even and unlabored, lungs CTA bilaterally Abdomen:  Soft, nontender and mild distension. Normal bowel sounds. Extremities:  Without edema. Neurologic:  Alert and oriented,  grossly normal neurologically. Psych:  Cooperative. Normal mood and affect.  Intake/Output from previous day: 04/25 0701 - 04/26 0700 In: -  Out: 1200 [Urine:1200]  Lab Results: Recent Labs    01/12/20 0620 01/14/20 0635  WBC 5.3 6.2  HGB 9.2* 9.3*  HCT 32.4* 32.8*  PLT 224 240   BMET Recent Labs    01/12/20 0620 01/14/20 0635  NA 146* 148*  K 4.4 5.0  CL 105 104  CO2 31 29  GLUCOSE 95 90  BUN 94* 101*  CREATININE 2.08* 2.19*  CALCIUM 9.6 10.0   LFT Recent Labs    01/14/20 0635  PROT 7.0  ALBUMIN 3.7  AST 54*  ALT 48*  ALKPHOS 331*  BILITOT 1.3*   PT/INR Recent Labs    01/14/20 0635  LABPROT 20.6*  INR 1.8*    Studies/Results: [place] ASCITES (ABDOMEN LIMITED)  Result Date: 01/12/2020 CLINICAL DATA:  Ascites EXAM: LIMITED ABDOMEN ULTRASOUND FOR ASCITES TECHNIQUE: Limited ultrasound survey for ascites was performed in all four abdominal quadrants. COMPARISON:  Renal sonogram of 12/24/2019 and recent CT of the abdomen pelvis. FINDINGS: Imaging was performed  in all 4 quadrants. Small to moderate volume of ascites was noted greatest in RIGHT lower and RIGHT upper quadrant. Smaller volume in LEFT lower quadrant. Coarse hepatic echotexture incidentally noted. IMPRESSION: Small moderate volume ascites. Nodular contour of the liver and coarsened hepatic echotexture remains suspicious for cirrhosis Electronically Signed   By: Zetta Bills M.D.   On: 01/12/2020 18:15     Assessment / Plan:   Assessment: 1.  Newly diagnosed NASH decompensated liver cirrhosis with portal hypertension 2.  Hepatic encephalopathy 3.  Ascites 4.  Acute on chronic kidney disease 5.  A. fib on Eliquis (on hold) 6.  Multiple comorbid conditions including morbid obesity, chronic respiratory failure requiring BiPAP with D CHF/RHF, rhabdo  Plan: 1.  2 g sodium diet 2.  If patient is unable to take p.o. today then would convert her back to lactulose enemas 3.  We were going to order an ultrasound with paracentesis today but per hospitalist family is thinking about moving to comfort care, will hold on this for now. 4.  Continue midodrine/octreotide/IV albumin 5.  Previously discussed that the patient is not a candidate for EGD due to multiple comorbid conditions 6.  Please await any further recommendations from Dr. Bryan Lemma later today  Thank you for your kind consultation, we will continue to follow    LOS: 16 days   Levin Erp  01/14/2020, 11:26 AM

## 2020-01-14 NOTE — Progress Notes (Signed)
RT placed patient on bipap V60 per MD. Patient tolerating well at this time. RN aware. RT will continue to monitor.

## 2020-01-14 NOTE — Progress Notes (Signed)
PT Cancellation Note  Patient Details Name: [name] [name] MRN: [id number] DOB: 07/15/1946   Cancelled Treatment:    Reason Eval/Treat Not Completed: Medical issues which prohibited therapy.  Per nsg pt is not able to be seen today, lethargic and waiting for lab work.  Will try again at another time.   Ramond Dial 01/14/2020, 1:10 PM   Mee Hives, PT MS Acute Rehab Dept. Number: [place]

## 2020-01-14 NOTE — Consult Note (Addendum)
[facility] Progress Note    Assessment/ Plan:   1. AKI on CKD:  BL 1.1. 2.4 on admission. Peaked at 2.61. now 2.19.   Received some diuresis with Lasix earlier in the admission and is now net -3.7 L for the hospitalization so far.  Diuresis was discontinued due to worsening renal function.  High suspicion for prerenal AKI due to HRS.  Currently being treated with midodrine, octreotide and albumin.  Continue to hold diuretics for now.   2. Acidosis, respiratory: worsened pH 7.24->7.17 following a night without biPAP.  Contributions from severe COPD and likely OHS.  Currently on BiPAP. 3. Uremia: BUN 101. Evidence of toxic metabolic encephalopathy on exam. Likely multifactorial with elevated uremia, acidosis and azotemia contributing. 4. Cirrhosis: MELD score of 22 today. 5. Ammonemia: Peaked at 152.  Now downtrending to 84.  Likely still contributing to her toxic metabolic encephalopathy.  Appears to be improving with rifaximin and lactulose. 6. Anemia, macrocytic: Slowly downtrending.  9.3 today.  B12 and folate levels normal.  Iron studies from 4/11 show iron: 63, ferritin: 31, saturation 17%.  Continue iron supplements. 7. Chronic respiratory failure: COPD with chronic respiratory failure likely contribution from OHS.  Home oxygen requirement of 2 L. 8. A-fib: Echo on 4/11 shows severely dilated right atrium. On Eliquis.   Subjective:   [name] [name] did not use her BiPAP overnight and as result was significantly somnolent this morning and had a ABG showing worsened acidosis.  She was put on a nonrebreather earlier this morning and BiPAP.  On my exam this morning, she was somnolent and not responsive to verbal stimuli but would respond to gentle sternal rub.  She was not awake or oriented enough to respond to questions or follow commands.   Objective:   BP (!) 101/58 (BP Location: Right Wrist)   Pulse 93   Temp 98.2 F (36.8 C) (Oral)   Resp (!) 22   Ht 5' 7"  (1.702 m)   Wt  130.2 kg   SpO2 95%   BMI 44.96 kg/m   Intake/Output Summary (Last 24 hours) at 01/14/2020 1035 Last data filed at 01/14/2020 0700 Gross per 24 hour  Intake --  Output 700 ml  Net -700 ml   Weight change:   Physical Exam: General: Lying in bed with BiPAP.  Not responsive to verbal stimuli.  Responsive only to mild sternal rub.  Did not follow commands.  Would not open eyes. Cardio: Normal S1 and S2, no S3 or S4. Rhythm is regular.  2/6 systolic murmur. Pulm: Moderate air movement noted bilaterally with anterior auscultation.  Currently receiving BiPAP. Abdomen: Abdomen distended with some tightness and edema.  Significant dependent edema noted around hips and thighs. Extremities: Anasarca with edema noted over dependent areas in hands, forearms, hips, stomach, thighs.  Difficult to palpate radial pulses due to habitus and edema.   Imaging: [place] ASCITES (ABDOMEN LIMITED)  Result Date: 01/12/2020 CLINICAL DATA:  Ascites EXAM: LIMITED ABDOMEN ULTRASOUND FOR ASCITES TECHNIQUE: Limited ultrasound survey for ascites was performed in all four abdominal quadrants. COMPARISON:  Renal sonogram of 12/24/2019 and recent CT of the abdomen pelvis. FINDINGS: Imaging was performed in all 4 quadrants. Small to moderate volume of ascites was noted greatest in RIGHT lower and RIGHT upper quadrant. Smaller volume in LEFT lower quadrant. Coarse hepatic echotexture incidentally noted. IMPRESSION: Small moderate volume ascites. Nodular contour of the liver and coarsened hepatic echotexture remains suspicious for cirrhosis Electronically Signed   By: Zetta Bills  M.D.   On: 01/12/2020 18:15    Labs: BMET Recent Labs  Lab 01/08/20 0423 01/09/20 2550 01/10/20 0423 01/11/20 0719 01/12/20 0620 01/14/20 0635  NA 147* 146* 145 146* 146* 148*  K 4.3 4.3 4.4 4.4 4.4 5.0  CL 108 107 106 106 105 104  CO2 31 29 30  32 31 29  GLUCOSE 97 125* 88 103* 95 90  BUN 85* 95* 91* 91* 94* 101*  CREATININE 2.15* 2.17*  2.08* 1.97* 2.08* 2.19*  CALCIUM 9.3 9.5 9.6 9.6 9.6 10.0  PHOS  --  4.1 3.7  --  4.7* 4.8*   CBC Recent Labs  Lab 01/10/20 0423 01/11/20 0719 01/12/20 0620 01/14/20 0635  WBC 5.6 5.8 5.3 6.2  NEUTROABS  --   --  4.0 4.1  HGB 9.2* 9.5* 9.2* 9.3*  HCT 31.7* 32.9* 32.4* 32.8*  MCV 102.6* 103.1* 105.9* 106.8*  PLT 178 215 224 240    Medications:    . allopurinol  50 mg Oral Daily  . apixaban  5 mg Oral BID  . arformoterol  15 mcg Nebulization BID  . budesonide (PULMICORT) nebulizer solution  0.25 mg Nebulization BID  . famotidine  20 mg Oral Daily  . feeding supplement (ENSURE ENLIVE)  237 mL Oral TID BM  . ferrous sulfate  325 mg Oral Q breakfast  . folic acid  1 mg Oral Daily  . free water  200 mL Oral Q4H  . lactulose  30 g Oral QID  . levothyroxine  200 mcg Oral Q0600  . midodrine  10 mg Oral TID WC  . octreotide  200 mcg Subcutaneous Q12H  . rifaximin  550 mg Oral BID  . sodium chloride flush  10-40 mL Intracatheter Q12H  . thiamine  100 mg Oral Daily  . vitamin B-12  1,000 mcg Oral Daily     [name] Haymaker, MD [place] Resident, PGY2 01/14/2020, 10:35 AM

## 2020-01-15 ENCOUNTER — Inpatient Hospital Stay (HOSPITAL_COMMUNITY): Payer: Medicare HMO

## 2020-01-15 DIAGNOSIS — R0902 Hypoxemia: Secondary | ICD-10-CM | POA: Diagnosis not present

## 2020-01-15 DIAGNOSIS — N179 Acute kidney failure, unspecified: Secondary | ICD-10-CM | POA: Diagnosis not present

## 2020-01-15 DIAGNOSIS — K7290 Hepatic failure, unspecified without coma: Secondary | ICD-10-CM | POA: Diagnosis not present

## 2020-01-15 DIAGNOSIS — Z515 Encounter for palliative care: Secondary | ICD-10-CM | POA: Diagnosis not present

## 2020-01-15 DIAGNOSIS — J9601 Acute respiratory failure with hypoxia: Secondary | ICD-10-CM | POA: Diagnosis not present

## 2020-01-15 LAB — BLOOD GAS, VENOUS
Acid-Base Excess: 5.6 mmol/L — ABNORMAL HIGH (ref 0.0–2.0)
Acid-Base Excess: 4.3 mmol/L — ABNORMAL HIGH (ref 0.0–2.0)
Bicarbonate: 31.8 mmol/L — ABNORMAL HIGH (ref 20.0–28.0)
Bicarbonate: 30.8 mmol/L — ABNORMAL HIGH (ref 20.0–28.0)
FIO2: 60
O2 Saturation: 69.5 %
O2 Saturation: 56.7 %
Patient temperature: 37
Patient temperature: 37
pCO2, Ven: 68.4 mmHg — ABNORMAL HIGH (ref 44.0–60.0)
pCO2, Ven: 70 mmHg — ABNORMAL HIGH (ref 44.0–60.0)
pH, Ven: 7.29 (ref 7.250–7.430)
pH, Ven: 7.266 (ref 7.250–7.430)
pO2, Ven: 42.6 mmHg (ref 32.0–45.0)
pO2, Ven: 37.1 mmHg (ref 32.0–45.0)

## 2020-01-15 LAB — CBC WITH DIFFERENTIAL/PLATELET
Abs Immature Granulocytes: 0.05 10*3/uL (ref 0.00–0.07)
Basophils Absolute: 0 10*3/uL (ref 0.0–0.1)
Basophils Relative: 1 %
Eosinophils Absolute: 0.1 10*3/uL (ref 0.0–0.5)
Eosinophils Relative: 2 %
HCT: 29.3 % — ABNORMAL LOW (ref 36.0–46.0)
Hemoglobin: 8.3 g/dL — ABNORMAL LOW (ref 12.0–15.0)
Immature Granulocytes: 1 %
Lymphocytes Relative: 11 %
Lymphs Abs: 0.5 10*3/uL — ABNORMAL LOW (ref 0.7–4.0)
MCH: 30.3 pg (ref 26.0–34.0)
MCHC: 28.3 g/dL — ABNORMAL LOW (ref 30.0–36.0)
MCV: 106.9 fL — ABNORMAL HIGH (ref 80.0–100.0)
Monocytes Absolute: 0.6 10*3/uL (ref 0.1–1.0)
Monocytes Relative: 12 %
Neutro Abs: 3.6 10*3/uL (ref 1.7–7.7)
Neutrophils Relative %: 73 %
Platelets: 208 10*3/uL (ref 150–400)
RBC: 2.74 MIL/uL — ABNORMAL LOW (ref 3.87–5.11)
RDW: 20.3 % — ABNORMAL HIGH (ref 11.5–15.5)
WBC: 4.8 10*3/uL (ref 4.0–10.5)
nRBC: 5.6 % — ABNORMAL HIGH (ref 0.0–0.2)

## 2020-01-15 LAB — GLUCOSE, CAPILLARY
Glucose-Capillary: 98 mg/dL (ref 70–99)
Glucose-Capillary: 82 mg/dL (ref 70–99)
Glucose-Capillary: 137 mg/dL — ABNORMAL HIGH (ref 70–99)
Glucose-Capillary: 42 mg/dL — CL (ref 70–99)
Glucose-Capillary: 200 mg/dL — ABNORMAL HIGH (ref 70–99)
Glucose-Capillary: 126 mg/dL — ABNORMAL HIGH (ref 70–99)

## 2020-01-15 LAB — COMPREHENSIVE METABOLIC PANEL
ALT: 32 U/L (ref 0–44)
AST: 28 U/L (ref 15–41)
Albumin: 3.7 g/dL (ref 3.5–5.0)
Alkaline Phosphatase: 241 U/L — ABNORMAL HIGH (ref 38–126)
Anion gap: 12 (ref 5–15)
BUN: 106 mg/dL — ABNORMAL HIGH (ref 8–23)
CO2: 31 mmol/L (ref 22–32)
Calcium: 9.9 mg/dL (ref 8.9–10.3)
Chloride: 105 mmol/L (ref 98–111)
Creatinine, Ser: 2.37 mg/dL — ABNORMAL HIGH (ref 0.44–1.00)
GFR calc Af Amer: 23 mL/min — ABNORMAL LOW (ref >60)
GFR calc non Af Amer: 20 mL/min — ABNORMAL LOW (ref >60)
Glucose, Bld: 100 mg/dL — ABNORMAL HIGH (ref 70–99)
Potassium: 5.5 mmol/L — ABNORMAL HIGH (ref 3.5–5.1)
Sodium: 148 mmol/L — ABNORMAL HIGH (ref 135–145)
Total Bilirubin: 1.6 mg/dL — ABNORMAL HIGH (ref 0.3–1.2)
Total Protein: 6.2 g/dL — ABNORMAL LOW (ref 6.5–8.1)

## 2020-01-15 LAB — MAGNESIUM: Magnesium: 2.6 mg/dL — ABNORMAL HIGH (ref 1.7–2.4)

## 2020-01-15 LAB — AMMONIA: Ammonia: 47 umol/L — ABNORMAL HIGH (ref 9–35)

## 2020-01-15 LAB — PHOSPHORUS: Phosphorus: 5.4 mg/dL — ABNORMAL HIGH (ref 2.5–4.6)

## 2020-01-15 MED ORDER — OCTREOTIDE ACETATE 100 MCG/ML IJ SOLN
200.0000 ug | Freq: Two times a day (BID) | INTRAMUSCULAR | Status: DC
  Administered 2020-01-15 – 2020-01-16 (×2): 200 ug via SUBCUTANEOUS
  Filled 2020-01-15 (×3): qty 2

## 2020-01-15 MED ORDER — FUROSEMIDE 10 MG/ML IJ SOLN
80.0000 mg | Freq: Once | INTRAMUSCULAR | Status: AC
  Administered 2020-01-15: 15:00:00 80 mg via INTRAVENOUS
  Filled 2020-01-15: qty 8

## 2020-01-15 NOTE — Progress Notes (Signed)
Responded to consult for pastoral support. Pt had recently received meds and was not able to talk. Son and grandson were at bedside. I offered them space to voice concerns and questions. Son stated his mom will be getting procedures done to better understand care and treatment for their mom. I offered them spiritual support with words of comfort, ministry of presence, and prayer. Chaplain available as needed.   Chaplain Resident Fidel Levy [phone]

## 2020-01-15 NOTE — Progress Notes (Signed)
PROGRESS NOTE    [name] [name]  IHK:[id number] DOB: 1946-08-08 DOA: 12/30/2019 PCP: [name] Borg, MD   Chief Complaint  Patient presents with  . Fall    Brief Narrative:  74 yo F PMH OSA, COPD on 2LNC, diastolic CHF, Afib, CKD III, SSS s/p PPM, PFO, hypothyroidism, depression, breast cancer s/p chemo and morbid obesity who presented to ED 4/10 with AMS after being found down at home. EMS was dispatched to patient's home after neighbors noticed mail was full. Upon EMS arrival, pt was hypoxic SpO2 80% on RA and had pressure ulcers on R side of body, and endorsed global myalgia. Patient reported that she sustained fall approx 6 days prior to EMS dispatch and was unable to stand self.   In ED, pt hypoxic but VBG not overtly hypercarbic. CK 2000, Cr 2.48. Admitted to [facility]  On 4/15, patient with progressively worsening resp status, as well as complex medical picture including rhabdo/AKI s/p fluid restriction due to pulm status, elevated BNP despite fluid restriction and ongoing diuresis.  Transfer to ICU.  She was put on intermittent BiPAP.  Diuresed with IV Lasix with improvement in her breathing.  She was transferred back to [facility] on 4/20.  She has declined over the past several days.  She's had persistent encephalopathy, thought 2/2 acute hypercarbic resp failure with hepatic encephalopathy (and possible contribution from uremia).  She has acute kidney injury and significant volume overload.  She's currently being treated for possible hepatorenal syndrome with midodrine, octreotide, and albumin with lasix on hold.  Received dose of lasix today, 4/27.  Palliative care is involved and currently [name] [name] transition to comfort measures is being discussed.  [name] [name], her son, is seeing his mother for the first time today during her hospitalization.  Her daughter, [name] [name], has been [name] [name] part of palliative discussions for the past several days.   Assessment & Plan:   Active Problems:   LFT elevation   Acute on  chronic respiratory failure with hypoxia (HCC)   AMS (altered mental status)   Non-traumatic rhabdomyolysis   Acute on chronic respiratory failure with hypoxia and hypercapnia (HCC)   Non-pressure chronic ulcer of other part of right foot limited to breakdown of skin ([place])   Fall   Moderate protein-calorie malnutrition ([place])   Idiopathic chronic gout without tophus   Acute respiratory failure with hypoxia ([place])   Palliative care by specialist   DNR (do not resuscitate)   Weakness generalized   Hepatic encephalopathy ([place])   Liver cirrhosis secondary to NASH ([place])  Acute metabolic encephalopathy:  Multifactorial including respiratory failure.   TSH 6.63.  Free T4 normal.  B12 5.7K.  No obvious signs of infection.  No focal neuro deficit.  CT head without acute finding.  Ammonia rising, 152 on 4/24 -> 80's today.  Hypercarbia with acidemia.  She continues to be significantly lethargic today.  Seems like her encephalopathy is related to hypercarbia and hepatic encephalopathy at this point. - improved this AM after bipap, but still confused - VBG this am shows hypercarbia, but improved acidemia - continue nightly bipap and as needed bipap - received oxycodone 5 mg x1 this AM for discomfort and she had worsening confusion [name] [name] little while after this - difficult balance while we discuss palliative care/comfort - continue lactulose (unable to take PO at this time, follow VBG - may need to restart lactulose enemas) - Frequent orientation and delirium precautions.  Acute on chronic RF with hypoxia and hypercarbia: Multifactorial including OSA/possible OHS,  COPD and PFO.  Not sure if she is compliant with CPAP at home.  On 2 L at baseline. - this morning was requiring NRB, increasing O2 requirement - lasix 80 mg x1 given per nephrology - will need to follow renal function with this -Minimum oxygen to maintain saturation above 88% -needs bipap nightly -Continue Brovana, pulmicort, prn albuterol  nebs -CXR with mild central pulm vascular congestion -repeat CXR stable cardiomegaly with central pulm vascular congestion  Acute on chronic diastolic heart failure: Echo on 4/11 with EF of 55 to 60%, no RWMA, indeterminate diastolic dysfunction, moderate LAE, severe RAE moderate TVR, moderate AVR and RVSP of 46.6.  BNP 2000 (higher than baseline).  Still with significant fluid overload clinically.  UOP 1 L 4/23 (foley now out).  Weight down. - lasix and spiro on hold right now with renal function - received lasix x1 4/27 with worsening respiratory status  -Midodrine 10 mg 3 times daily for soft blood pressures -Strict intake and output -Sodium restriction -Repeat BNP elevated -Renal has been c/s, appreciate recs  AKI on CKD-3a/azotemia: Baseline Cr ~1.4> 2.48 (admit)> 2.61 (peak)>> 2.37 4/27, fluctuating.  Renal ultrasound without significant finding.  Per renal, At this point in time, possible AKI due to hemodynamics 2/2 diuretics with intravascular depletion + soft BP's with hypoperfusion + possible hepatorenal.  Possible HF -> anasarca.  Still with significant fluid overload on exam. - lasix x1 today due to resp status - nephrology has been c/s - suspects AKI hemodynamic 2/2 diuretics with intravasc depletion + soft BP's with hypoperfusion + poss hepatorenal.  Other poss includes chronic HF causing anasarca.  Pt not dialysis candidate.  For now, treating with HRS protocol.  Lasix has been held.    Hypervolemic hypernatremia: Na 147 (admit)> 156 (peak)> 148 -appreciate renal assistance -free water when able from mental status - D10  Fall at home Debility/physical deconditioning Traumatic rhabdomyolysis-resolved. -PT/OT recommended SNF.  Hypoglycemia: Likely due to poor Po p.o. intake. - D10 and follow POC BG's  Paroxysmal [name] [name]. Fib: Now in sinus rhythm without medications.  CHA2DS2-VASc score greater than 3. -Continue Eliquis for anticoagulation for now.  Need to discuss long  term risk and benefit given risk of fall.  SSS s/p PPM-stable.  Macrocytic anemia: B12 level high, folate wnl.  H&H stable. - ferritin low normal, overall labs wnl.  Hypoproliferative retics. - will start PO iron  Hypothyroidism: TSH 6.6. -Continue home Synthroid  Thrombocytopenia: Improving. -Continue monitoring  Dysphagia -Appreciate SLP input-dysphagia 2 diet.  Morbid obesity:Body mass index is 45.23 kg/m. -Challenging issue given patient's comorbidity and immobility.  History of gout: Uric acid elevated but patient denies pain.  Could be due to renal failure. - Follow uric acid intermittently  -restart allopurinol, low dose with aki  Mood Disorder: med rec notes she's not taking many of her psych meds.  Currently still has AMS.  Palliative recommending psych c/s, will continue to monitor - consider if mental status improves Cheridan Kibler bit more.  Goal of care discussion: Patient with significant comorbidities and debility as above.  She is Kamica Florance still full code.  Per previous provider "I do not believe patient has capacity to make reasonable medical decision.  I discussed with patient's daughter, [name] [name] who states she is the only close family.  [name] [name] is working to  [employer] and guardianship paper.  After discussion about patient's comorbidity, quality of life, CODE STATUS and pros and cons of CPR and intubation, [name] [name] requested for CODE STATUS to be changed  to DNR/DNI."  [name] [name], son, here for first time today.  Discussed GOC and possible transition to comfort measures vs continued supportive care.  He'd like Jmya Uliano little more time and information.  Will continue conversation this PM. -CODE STATUS changed to DNR/DNI -Palliative care consulted  Nutrition Nutrition Problem: Inadequate oral intake Etiology: poor appetite  Signs/Symptoms: meal completion < 50%  Interventions: Prostat, Hormel Shake  R sided skin breakdown, present on admission-no signs of infection. -wound care, frequent  turns.  RIJ central line Discontinue central line, establish PIV (central line still in place today, will discuss removal with RN)  Foley in place:  Now d/c'd  Elevated LFT's: mild, follow acute hepatitis panel, continue to monitor.  RUQ [place] 4/21 with sludge and stones with nonspecific gallbladder wall thickening (2/2 acute vs chronic cholecystitis, hepatic disease, or edema forming states).  Echogenic liver.  ? Cirrhosis. Follow inr.  Normal bili, platelets.  Albumin 2.7. GI c/s for hepatic encephalopathy - now following peripherally  [place] for ascites shows small mod volume.  On abx with her encephalopathy to cover SBP - with poor prognosis, discussed with daughter paracentesis - as considering goc, palliative care at this point, will hold off for now  DVT prophylaxis: eliquis Code Status: DNR Family Communication: none at bedside, daughter over phone 4/25 Disposition:   Status is: Inpatient  Remains inpatient appropriate because:Altered mental status, IV treatments appropriate due to intensity of illness or inability to take PO, Inpatient level of care appropriate due to severity of illness and continued need for IV diuresis, monitoring of renal function in setting of volume overload   Dispo: The patient is from: Home              Anticipated d/c is to: SNF              Anticipated d/c date is: 3 days              Patient currently is not medically stable to d/c.  Consultants:   none  Procedures:  Echo IMPRESSIONS    1. Limted bubble study Bi atrial enlargement with pacing wire in RA/RV.  Mobile redundant atrial septum with positive bubble study for right to  left shunt fairly large amount of bubbles crossing septum.   Echo IMPRESSIONS    1. Left ventricular ejection fraction, by estimation, is 55 to 60%. The  left ventricle has normal function. The left ventricle has no regional  wall motion abnormalities. Left ventricular diastolic parameters are  indeterminate.   2. Right ventricular systolic function is normal. The right ventricular  size is normal. There is moderately elevated pulmonary artery systolic  pressure.  3. Left atrial size was moderately dilated.  4. Right atrial size was severely dilated.  5. The mitral valve is grossly normal. Mild to moderate mitral valve  regurgitation.  6. Tricuspid valve regurgitation is moderate.  7. The aortic valve is tricuspid. Aortic valve regurgitation is moderate.  Mild aortic valve stenosis.   LE [place] Summary:  RIGHT:  - Findings appear essentially unchanged compared to previous examination.  - There is no evidence of deep vein thrombosis in the lower extremity.  However, portions of this examination were limited- see technologist  comments above.    LEFT:  - There is no evidence of deep vein thrombosis in the lower extremity.  However, portions of this examination were limited- see technologist  comments above.   UE [place] Summary:    Right:  No evidence of deep  vein thrombosis in the upper extremity. However,  unable to  visualize the axillary, cephalic, and basilic veins.    Left:  No evidence of deep vein thrombosis in the upper extremity. However,  unable to  visualize the cephalic, basilic, and internal jugular veins.    Antimicrobials:  Anti-infectives (From admission, onward)   Start     Dose/Rate Route Frequency Ordered Stop   01/12/20 2200  rifaximin (XIFAXAN) tablet 550 mg     550 mg Oral 2 times daily 01/12/20 1803     01/12/20 1930  cefTRIAXone (ROCEPHIN) 2 g in sodium chloride 0.9 % 100 mL IVPB     2 g 200 mL/hr over 30 Minutes Intravenous Every 24 hours 01/12/20 1841     01/02/20 0600  vancomycin (VANCOREADY) IVPB 1250 mg/250 mL  Status:  Discontinued     1,250 mg 166.7 mL/hr over 90 Minutes Intravenous Every 48 hours 12/31/19 0801 01/01/20 0757   12/31/19 1700  cefTRIAXone (ROCEPHIN) 2 g in sodium chloride 0.9 % 100 mL IVPB  Status:  Discontinued     2 g 200  mL/hr over 30 Minutes Intravenous Every 24 hours 12/31/19 1250 01/01/20 0757   12/31/19 1000  aztreonam (AZACTAM) 1 g in sodium chloride 0.9 % 100 mL IVPB  Status:  Discontinued     1 g 200 mL/hr over 30 Minutes Intravenous Every 8 hours 12/31/19 0801 12/31/19 1250   12/31/19 0900  vancomycin (VANCOREADY) IVPB 2000 mg/400 mL     2,000 mg 200 mL/hr over 120 Minutes Intravenous  Once 12/31/19 0801 12/31/19 1221   12/31/19 0800  metroNIDAZOLE (FLAGYL) IVPB 500 mg  Status:  Discontinued     500 mg 100 mL/hr over 60 Minutes Intravenous Every 8 hours 12/31/19 0753 01/01/20 0757      Subjective: Alert, but still disoriented Unable to have conversation clearly about her goals  Objective: Vitals:   01/15/20 1120 01/15/20 1130 01/15/20 1243 01/15/20 1409  BP:  92/64  (!) 87/58  Pulse:    80  Resp:  16  (!) 29  Temp: (!) 97.4 F (36.3 C)     TempSrc: Oral     SpO2: 92%  (!) 89% 92%  Weight:      Height:        Intake/Output Summary (Last 24 hours) at 01/15/2020 1531 Last data filed at 01/15/2020 0800 Gross per 24 hour  Intake 676.06 ml  Output 200 ml  Net 476.06 ml   Filed Weights   01/12/20 0004 01/14/20 0405 01/15/20 0100  Weight: 128.9 kg 130.2 kg 131 kg    Examination:  General: No acute distress. Cardiovascular: Heart sounds show Talyn Dessert regular rate, and rhythm.  Lungs: Clear to auscultation bilaterally Abdomen: Soft, nontender, nondistended Neurological: Alert and disoriented. Moves all extremities 4. Cranial nerves II through XII grossly intact. Skin: Warm and dry. No rashes or lesions. Extremities: anasarca    Data Reviewed: I have personally reviewed following labs and imaging studies  CBC: Recent Labs  Lab 01/10/20 0423 01/11/20 0719 01/12/20 0620 01/14/20 0635 01/15/20 0459  WBC 5.6 5.8 5.3 6.2 4.8  NEUTROABS  --   --  4.0 4.1 3.6  HGB 9.2* 9.5* 9.2* 9.3* 8.3*  HCT 31.7* 32.9* 32.4* 32.8* 29.3*  MCV 102.6* 103.1* 105.9* 106.8* 106.9*  PLT 178 215 224  240 528    Basic Metabolic Panel: Recent Labs  Lab 01/09/20 0438 01/09/20 0438 01/10/20 0423 01/11/20 0719 01/12/20 0620 01/14/20 0635 01/15/20 0459  NA 146*   < >  145 146* 146* 148* 148*  K 4.3   < > 4.4 4.4 4.4 5.0 5.5*  CL 107   < > 106 106 105 104 105  CO2 29   < > 30 32 31 29 31   GLUCOSE 125*   < > 88 103* 95 90 100*  BUN 95*   < > 91* 91* 94* 101* 106*  CREATININE 2.17*   < > 2.08* 1.97* 2.08* 2.19* 2.37*  CALCIUM 9.5   < > 9.6 9.6 9.6 10.0 9.9  MG 2.3   < > 2.3 2.3 2.4 2.7* 2.6*  PHOS 4.1  --  3.7  --  4.7* 4.8* 5.4*   < > = values in this interval not displayed.    GFR: Estimated Creatinine Clearance: 29.8 mL/min (Rexton Greulich) (by C-G formula based on SCr of 2.37 mg/dL (H)).  Liver Function Tests: Recent Labs  Lab 01/10/20 0423 01/11/20 0719 01/12/20 0620 01/14/20 0635 01/15/20 0459  AST 78* 69* 59* 54* 28  ALT 49* 60* 59* 48* 32  ALKPHOS 273* 305* 323* 331* 241*  BILITOT 0.8 1.1 0.8 1.3* 1.6*  PROT 6.1* 6.2* 6.4* 7.0 6.2*  ALBUMIN 2.6*  2.6* 2.7* 2.7* 3.7 3.7    CBG: Recent Labs  Lab 01/14/20 2000 01/14/20 2351 01/15/20 0355 01/15/20 0728 01/15/20 1118  GLUCAP 71 83 98 82 137*     Recent Results (from the past 240 hour(s))  Respiratory Panel by RT PCR (Flu Ceasia Elwell&B, Covid) - Nasopharyngeal Swab     Status: None   Collection Time: 01/09/20  2:41 PM   Specimen: Nasopharyngeal Swab  Result Value Ref Range Status   SARS Coronavirus 2 by RT PCR NEGATIVE NEGATIVE Final    Comment: (NOTE) SARS-CoV-2 target nucleic acids are NOT DETECTED. The SARS-CoV-2 RNA is generally detectable in upper respiratoy specimens during the acute phase of infection. The lowest concentration of SARS-CoV-2 viral copies this assay can detect is 131 copies/mL. Emmitt Matthews negative result does not preclude SARS-Cov-2 infection and should not be used as the sole basis for treatment or other patient management decisions. Jahziah Simonin negative result may occur with  improper specimen collection/handling,  submission of specimen other than nasopharyngeal swab, presence of viral mutation(s) within the areas targeted by this assay, and inadequate number of viral copies (<131 copies/mL). Eloise Mula negative result must be combined with clinical observations, patient history, and epidemiological information. The expected result is Negative. Fact Sheet for Patients:  PinkCheek.be Fact Sheet for Healthcare Providers:  GravelBags.it This test is not yet ap proved or cleared by the [place] FDA and  has been authorized for detection and/or diagnosis of SARS-CoV-2 by FDA under an Emergency Use Authorization (EUA). This EUA will remain  in effect (meaning this test can be used) for the duration of the COVID-19 declaration under Section 564(b)(1) of the Act, 21 U.S.C. section 360bbb-3(b)(1), unless the authorization is terminated or revoked sooner.    Influenza Beuna Bolding by PCR NEGATIVE NEGATIVE Final   Influenza B by PCR NEGATIVE NEGATIVE Final    Comment: (NOTE) The Xpert Xpress SARS-CoV-2/FLU/RSV assay is intended as an aid in  the diagnosis of influenza from Nasopharyngeal swab specimens and  should not be used as Kaetlyn Noa sole basis for treatment. Nasal washings and  aspirates are unacceptable for Xpert Xpress SARS-CoV-2/FLU/RSV  testing. Fact Sheet for Patients: PinkCheek.be Fact Sheet for Healthcare Providers: GravelBags.it This test is not yet approved or cleared by the [place] FDA and  has been authorized for detection and/or diagnosis of  SARS-CoV-2 by  FDA under an Emergency Use Authorization (EUA). This EUA will remain  in effect (meaning this test can be used) for the duration of the  Covid-19 declaration under Section 564(b)(1) of the Act, 21  U.S.C. section 360bbb-3(b)(1), unless the authorization is  terminated or revoked. Performed at [facility], [place] [address],  [place], [place] [address]          Radiology Studies: DG CHEST PORT 1 VIEW  Result Date: 01/15/2020 CLINICAL DATA:  Shortness of breath. EXAM: PORTABLE CHEST 1 VIEW COMPARISON:  January 14, 2020. FINDINGS: Stable cardiomegaly with central pulmonary vascular congestion. Left-sided pacemaker is unchanged in position. No pneumothorax or pleural effusion is noted. No consolidative process is noted. Bony thorax is unremarkable. IMPRESSION: Stable cardiomegaly with central pulmonary vascular congestion. Electronically Signed   By: Marijo Conception M.D.   On: 01/15/2020 12:41   DG CHEST PORT 1 VIEW  Result Date: 01/14/2020 CLINICAL DATA:  Hypoxia. EXAM: PORTABLE CHEST 1 VIEW COMPARISON:  January 12, 2020. FINDINGS: Stable cardiomegaly with central pulmonary vascular congestion is noted. Left-sided pacemaker is unchanged in position. No pneumothorax or pleural effusion is noted. No significant consolidative process is noted. Bony thorax is unremarkable. IMPRESSION: Stable cardiomegaly with central pulmonary vascular congestion. Electronically Signed   By: Marijo Conception M.D.   On: 01/14/2020 13:20        Scheduled Meds: . allopurinol  50 mg Oral Daily  . apixaban  5 mg Oral BID  . arformoterol  15 mcg Nebulization BID  . budesonide (PULMICORT) nebulizer solution  0.25 mg Nebulization BID  . famotidine  20 mg Oral Daily  . feeding supplement (ENSURE ENLIVE)  237 mL Oral TID BM  . ferrous sulfate  325 mg Oral Q breakfast  . folic acid  1 mg Oral Daily  . free water  200 mL Oral Q4H  . lactulose  30 g Oral QID  . levothyroxine  200 mcg Oral Q0600  . midodrine  10 mg Oral TID WC  . octreotide  200 mcg Subcutaneous Q12H  . rifaximin  550 mg Oral BID  . sodium chloride flush  10-40 mL Intracatheter Q12H  . thiamine  100 mg Oral Daily  . vitamin B-12  1,000 mcg Oral Daily   Continuous Infusions: . sodium chloride Stopped (01/13/20 2130)  . albumin human 25 g (01/15/20 1509)  . cefTRIAXone  (ROCEPHIN)  IV Stopped (01/14/20 2044)  . dextrose 30 mL/hr at 01/15/20 0800     LOS: 17 days    Time spent: over 30 min    Fayrene Helper, MD [facility]   To contact the attending provider between 7A-7P or the covering provider during after hours 7P-7A, please log into the web site [email or web address] and access using universal [place] password for that web site. If you do not have the password, please call the hospital operator.  01/15/2020, 3:31 PM

## 2020-01-15 NOTE — Progress Notes (Signed)
Family at the beside. Pt LOC alert, MEWs score currently green. Monitoring.

## 2020-01-15 NOTE — Discharge Instructions (Signed)

## 2020-01-15 NOTE — Progress Notes (Signed)
VAST consulted to obtain IV access. Spoke with pt's nurse, Jackelyn Poling. She stated pt has an IV, but she is unsure it is still good as she cannot obtain blood return. Assessed pt's RAF IV. Took dressing down and able to obtain slow blood return; flushed easily without swelling, redness, or pain. Cleaned and redressed site per protocol.  Per pt's son, pt has restricted left arm d/t hx of breast cancer with lymph node removal.

## 2020-01-15 NOTE — Consult Note (Addendum)
[facility] Progress Note    Assessment/ Plan:   1. AKI on CKD:  BL 1.1. 2.4 on admission. Peaked at 2.61.  Creatinine 2.3 today, mild increase from 2.1 yesterday.  High suspicion for hepatorenal syndrome.  Continue with albumin and midodrine for now.  Per chart review, there appears to be some discussion about transition to hospice although the decision has not been made yet.  Due to multiple comorbidities, [name] [name] is a poor dialysis candidate.  Continue talks with palliative care will be helpful. 2. Acidosis, respiratory: Improving with BiPAP overnight.  pH 7.29 this morning increased from 7.17 yesterday.  IV lasix 80 mg given once to help with respiratory distress. 3. Uremia: BUN 107 today. Evidence of toxic metabolic encephalopathy on exam. Likely multifactorial with elevated uremia, acidosis and azotemia contributing. 4. Cirrhosis: MELD score of 23 today.  5. hyperammonemia: Peaked at 152.  Downtrending to 84.  Likely still contributing to her toxic metabolic encephalopathy.  Appears to be improving with rifaximin and lactulose. 6. Hypernatremia: Na 148 today. Continue 200 mL free water Q 4 hours 7. Anemia, macrocytic: Slowly downtrending.  8.3 today down from 9.3 yesterday.  Continue iron supplements. 8. Chronic respiratory failure: COPD with chronic respiratory failure likely contribution from OHS.  Home oxygen requirement of 2 L. 9. A-fib: Echo on 4/11 shows severely dilated right atrium. On Eliquis.   Subjective:   Per GI note, there was a good discussion with the daughter of [name] [name] last night during which they decided that they would move to comfort care once the son of [name] [name] was able to see his mother and spend time with her.    [name] [name] kept her BiPAP on over night and appears significantly more awake compared to my assessment yesterday.  She was able to tell me that she was not experiencing any nausea or vomiting.    The daughter and son of [name] [name] were  present and noted that they are planning to meet with palliative care at 4 PM this afternoon to help make decisions regarding transitioning care.  Objective:   BP 104/67   Pulse 79   Temp 97.8 F (36.6 C)   Resp (!) 23   Ht 5' 7"  (1.702 m)   Wt 131 kg   SpO2 92%   BMI 45.23 kg/m   Intake/Output Summary (Last 24 hours) at 01/15/2020 0847 Last data filed at 01/15/2020 0800 Gross per 24 hour  Intake 676.06 ml  Output 200 ml  Net 476.06 ml   Weight change: 0.8 kg  Physical Exam: General: awake with BiPAP this am.  Resting in bed comfortably. Later seen eating accompanied by RN with [place] in place.   Cardio: Normal S1 and S2, no S3 or S4. No murmurs or rubs.   Pulm: Difficult to assess lung sounds due to body habitus.  Breathing with BiPAP earlier in the day and later seen breathing comfortably with nasal cannula in place while eating. Abdomen: Protuberant. Firm. Not peritoneal.  Extremities: anasarca. Pitting edema noted in all dependent areas.   Imaging: DG CHEST PORT 1 VIEW  Result Date: 01/14/2020 CLINICAL DATA:  Hypoxia. EXAM: PORTABLE CHEST 1 VIEW COMPARISON:  January 12, 2020. FINDINGS: Stable cardiomegaly with central pulmonary vascular congestion is noted. Left-sided pacemaker is unchanged in position. No pneumothorax or pleural effusion is noted. No significant consolidative process is noted. Bony thorax is unremarkable. IMPRESSION: Stable cardiomegaly with central pulmonary vascular congestion. Electronically Signed   By: Sabino Dick  Jr M.D.   On: 01/14/2020 13:20    Labs: BMET Recent Labs  Lab 01/09/20 0438 01/10/20 0423 01/11/20 0719 01/12/20 0620 01/14/20 0635 01/15/20 0459  NA 146* 145 146* 146* 148* 148*  K 4.3 4.4 4.4 4.4 5.0 5.5*  CL 107 106 106 105 104 105  CO2 29 30 32 31 29 31   GLUCOSE 125* 88 103* 95 90 100*  BUN 95* 91* 91* 94* 101* 106*  CREATININE 2.17* 2.08* 1.97* 2.08* 2.19* 2.37*  CALCIUM 9.5 9.6 9.6 9.6 10.0 9.9  PHOS 4.1 3.7  --  4.7* 4.8*  5.4*   CBC Recent Labs  Lab 01/11/20 0719 01/12/20 0620 01/14/20 0635 01/15/20 0459  WBC 5.8 5.3 6.2 4.8  NEUTROABS  --  4.0 4.1 3.6  HGB 9.5* 9.2* 9.3* 8.3*  HCT 32.9* 32.4* 32.8* 29.3*  MCV 103.1* 105.9* 106.8* 106.9*  PLT 215 224 240 208    Medications:    . allopurinol  50 mg Oral Daily  . apixaban  5 mg Oral BID  . arformoterol  15 mcg Nebulization BID  . budesonide (PULMICORT) nebulizer solution  0.25 mg Nebulization BID  . famotidine  20 mg Oral Daily  . feeding supplement (ENSURE ENLIVE)  237 mL Oral TID BM  . ferrous sulfate  325 mg Oral Q breakfast  . folic acid  1 mg Oral Daily  . free water  200 mL Oral Q4H  . lactulose  30 g Oral QID  . levothyroxine  200 mcg Oral Q0600  . midodrine  10 mg Oral TID WC  . octreotide  200 mcg Subcutaneous Q12H  . rifaximin  550 mg Oral BID  . sodium chloride flush  10-40 mL Intracatheter Q12H  . thiamine  100 mg Oral Daily  . vitamin B-12  1,000 mcg Oral Daily     Matilde Haymaker, MD [place] Resident, PGY2 01/15/2020, 8:47 AM

## 2020-01-15 NOTE — Progress Notes (Signed)
PT Cancellation Note  Patient Details Name: [name] [name] MRN: [id number] DOB: 11-20-1945   Cancelled Treatment:    Reason Eval/Treat Not Completed: Medical issues which prohibited therapy  Noted pt with soft BP.  Spoke with RN who reports pt on bipap, soft BP and got lasix.  Reports pt not appropriate for OOB activity at this time.  Will f/u as able.  [name] [name], PT Acute Rehab Services Pager [phone] [facility] [phone] [facility] [phone]    Karlton Lemon 01/15/2020, 4:48 PM

## 2020-01-15 NOTE — Progress Notes (Signed)
Patient ID: [name] [name], female   DOB: June 22, 1946, 74 y.o.   MRN: [id number]  This NP visited patient at the bedside for palliative medicine needs, emotional support and to meet again with daughter/[name] and today with son/[name]/he arrived to town last night as scheduled for continued conversation regarding current medical situation.  Patient remains on BiPAP most of the day, she did have moments of clarity and was able to tolerate some po intake.  She remains high risk for continued decompensation.  Education offered regarding current medical situation: diagnosis, prognosis, current treatments/BiPAP, IV albumin and lasix.  We discussed the patient's multiple comorbidities and long-term poor prognosis.  Created space for son to explore his thoughts and feelings regarding his mother's current medical situation .   Questions and concern addressed.  Emotional support offered   Education regarding the difference between a continued aggressive medicalal intervtenions path and palliaitve comfort path.  Education offered regarding the natural trajectory and expectations at end of life.  Thankfully patient was able to communicate to her children her desire to "go home" and her understnding that she was dying.  This was both upsetting for her children but also comforting understanding/knowing their mother's wishes.        However at this time family is asking to continue current medical interventions, including BiPap until morning giving her son time to spend with his mother and process her likely impeding death.   [name] [name]  verbalizes an understanding of the seriousness of the situation and  poor prognosis.  She tells me that focus of care will shift to comfort tomorrow morning, honoring the patient's wishes  Questions and concerns addressed   Discussed with Dr Florene Glen and bedside RN  Total time spent on the unit was 35 minutes    PMT will f/u with family in the morning  Greater than 50% of the time was  spent in counseling and coordination of care  Wadie Lessen NP  Palliative Medicine Team Team Phone # [phone] Pager [phone]

## 2020-01-16 DIAGNOSIS — J9621 Acute and chronic respiratory failure with hypoxia: Secondary | ICD-10-CM | POA: Diagnosis not present

## 2020-01-16 DIAGNOSIS — R0609 Other forms of dyspnea: Secondary | ICD-10-CM | POA: Diagnosis not present

## 2020-01-16 DIAGNOSIS — R400 Somnolence: Secondary | ICD-10-CM | POA: Diagnosis not present

## 2020-01-16 DIAGNOSIS — Z515 Encounter for palliative care: Secondary | ICD-10-CM | POA: Diagnosis not present

## 2020-01-16 DIAGNOSIS — K7581 Nonalcoholic steatohepatitis (NASH): Secondary | ICD-10-CM | POA: Diagnosis not present

## 2020-01-16 DIAGNOSIS — R0902 Hypoxemia: Secondary | ICD-10-CM | POA: Diagnosis not present

## 2020-01-16 LAB — GLUCOSE, CAPILLARY
Glucose-Capillary: 114 mg/dL — ABNORMAL HIGH (ref 70–99)
Glucose-Capillary: 121 mg/dL — ABNORMAL HIGH (ref 70–99)
Glucose-Capillary: 123 mg/dL — ABNORMAL HIGH (ref 70–99)

## 2020-01-16 MED ORDER — LORAZEPAM 2 MG/ML IJ SOLN
2.0000 mg | Freq: Four times a day (QID) | INTRAMUSCULAR | Status: DC | PRN
  Administered 2020-01-16: 14:00:00 2 mg via INTRAVENOUS
  Filled 2020-01-16: qty 1

## 2020-01-16 MED ORDER — MORPHINE SULFATE (PF) 2 MG/ML IV SOLN
2.0000 mg | INTRAVENOUS | Status: DC | PRN
  Administered 2020-01-16 – 2020-01-17 (×5): 2 mg via INTRAVENOUS
  Filled 2020-01-16 (×6): qty 1

## 2020-01-16 NOTE — Progress Notes (Signed)
Nutrition Brief Note  Chart reviewed. Pt now transitioning to comfort care.  No further nutrition interventions warranted at this time.  Please re-consult as needed.   Destiney Sanabia W, RD, LDN, CDCES Registered Dietitian II Certified Diabetes Care and Education Specialist Please refer to [facility] for RD and/or RD on-call/weekend/after hours pager  

## 2020-01-16 NOTE — Progress Notes (Signed)
[facility] Progress Note    Assessment/ Plan:   1. AKI on CKD:  BL 1.1. 2.4 on admission. Peaked at 2.61.  Creatinine 2.3 today, mild increase from 2.1 yesterday.  High suspicion for hepatorenal syndrome.  No new labs this morning, multiple attempts to draw blood by phlebotomy without success.  Continue midodrine, albumin and octreotide for now.  Continue talks with palliative care as her prognosis is poor.  It appears that her mental status immediately deteriorates after taking off BiPAP. 2. Acidosis, respiratory: This seems to improve significantly with BiPAP overnight.  This morning, she was somnolent after being off of BiPAP for about 2 hours. 3. Uremia: BUN 107 today. Evidence of toxic metabolic encephalopathy on exam. Likely multifactorial with elevated uremia, acidosis and azotemia contributing. 4. Cirrhosis: MELD score 23 today. 5. hyperammonemia: Peaked at 152.  Downtrending to 45 today. Appears to be improving with rifaximin and lactulose. 6. Hypernatremia: Continue 200 mL free water Q 4 hours 7. Anemia, macrocytic: Slowly downtrending.  8.3 today down from 9.3 yesterday.  Continue iron supplements. 8. Chronic respiratory failure: COPD with chronic respiratory failure likely contribution from OHS.  Home oxygen requirement of 2 L. 9. A-fib: Echo on 4/11 shows severely dilated right atrium. On Eliquis.  Overall, Ms. Dettmann has a poor prognosis.  Continued conversations with palliative care would be encouraged.  ROS: she was unable to participate in a ROS due to somnolence.   Subjective:   Per last note from palliative care on 4/27, Ms. Ryback has not yet transitioned to comfort care.  This morning, she was somnolent and difficult to arouse but would respond to gentle sternal rubbing.  She was not able to wake up sufficiently to answer questions or follow commands.  At the time of that exam, she had been off of BiPAP for about 2 hours.  Objective:   BP (!) 97/51 (BP  Location: Right Wrist)   Pulse 81   Temp 97.7 F (36.5 C) (Oral)   Resp 18   Ht 5' 7"  (1.702 m)   Wt 131 kg   SpO2 94%   BMI 45.23 kg/m   Intake/Output Summary (Last 24 hours) at 01/16/2020 0951 Last data filed at 01/16/2020 0700 Gross per 24 hour  Intake 1380.46 ml  Output 200 ml  Net 1180.46 ml   Weight change:   Physical Exam: General: Ill-appearing elderly woman lying in bed somnolent.  Would open her eyes and respond nonverbally following a gentle sternal rub. Cardiac: Distant heart sounds 2/6 systolic murmur.  Regular rate.  Difficult to palpate radial pulse. Respiratory: Breathing with 15 L nasal cannula saturating in the low 90s. Abdomen: Protuberant, edematous with pitting. Extremities: Significant pitting edema noted in all extremities.  Hands are cool to the touch. Neuro: Somnolent.   Imaging: DG CHEST PORT 1 VIEW  Result Date: 01/15/2020 CLINICAL DATA:  Shortness of breath. EXAM: PORTABLE CHEST 1 VIEW COMPARISON:  January 14, 2020. FINDINGS: Stable cardiomegaly with central pulmonary vascular congestion. Left-sided pacemaker is unchanged in position. No pneumothorax or pleural effusion is noted. No consolidative process is noted. Bony thorax is unremarkable. IMPRESSION: Stable cardiomegaly with central pulmonary vascular congestion. Electronically Signed   By: Marijo Conception M.D.   On: 01/15/2020 12:41     Labs: BMET Recent Labs  Lab 01/10/20 0423 01/11/20 0719 01/12/20 0620 01/14/20 0635 01/15/20 0459  NA 145 146* 146* 148* 148*  K 4.4 4.4 4.4 5.0 5.5*  CL 106 106 105 104 105  CO2 30 32 31 29 31   GLUCOSE 88 103* 95 90 100*  BUN 91* 91* 94* 101* 106*  CREATININE 2.08* 1.97* 2.08* 2.19* 2.37*  CALCIUM 9.6 9.6 9.6 10.0 9.9  PHOS 3.7  --  4.7* 4.8* 5.4*   CBC Recent Labs  Lab 01/11/20 0719 01/12/20 0620 01/14/20 0635 01/15/20 0459  WBC 5.8 5.3 6.2 4.8  NEUTROABS  --  4.0 4.1 3.6  HGB 9.5* 9.2* 9.3* 8.3*  HCT 32.9* 32.4* 32.8* 29.3*  MCV 103.1*  105.9* 106.8* 106.9*  PLT 215 224 240 208    Medications:    . allopurinol  50 mg Oral Daily  . apixaban  5 mg Oral BID  . arformoterol  15 mcg Nebulization BID  . budesonide (PULMICORT) nebulizer solution  0.25 mg Nebulization BID  . famotidine  20 mg Oral Daily  . feeding supplement (ENSURE ENLIVE)  237 mL Oral TID BM  . ferrous sulfate  325 mg Oral Q breakfast  . folic acid  1 mg Oral Daily  . free water  200 mL Oral Q4H  . lactulose  30 g Oral QID  . levothyroxine  200 mcg Oral Q0600  . midodrine  10 mg Oral TID WC  . octreotide  200 mcg Subcutaneous Q12H  . rifaximin  550 mg Oral BID  . sodium chloride flush  10-40 mL Intracatheter Q12H  . thiamine  100 mg Oral Daily  . vitamin B-12  1,000 mcg Oral Daily     Matilde Haymaker, MD [place] Resident, PGY2 01/16/2020, 9:51 AM

## 2020-01-16 NOTE — Progress Notes (Signed)
Patient ID: [name] [name], female   DOB: May 22, 1946, 74 y.o.   MRN: [id number]  This NP spoke to treatment team and then  with daughter/[name] and son/[name]/ by telephone for continued conversation regarding current medical situation.  Patient continues to fail to thrive in spite of medical interventions attempting to treat the treatable.  Currently  On HFNC/15 liters, desaturates with minimal exertion.  She remains high risk for continued decompensation.  Education offered regarding current medical situation: diagnosis, prognosis, current treatments/BiPAP, IV albumin and lasix.  We discussed the patient's multiple comorbidities and long-term poor prognosis.  Education regarding the difference between a continued aggressive medicalal intervtenions path and palliaitve comfort path.  Education offered regarding the natural trajectory and expectations at end of life.  Family has made decision to shift to full comfort path.   Emotional support offered.   Plan of Care: -DNR/DNI -comfort and dignity are focus of care, Allow A Natural Death -no artifical feeding or hydration now or in the future -no further diagnostics, or life prolonging measures -no Bipap, wean HFNC to nasal cannula -symptom management specific to pain and dyspnea    -Morphine 2 mg IV every 1 hr prn    -Ativan 2 mg IV every 6 hrs -monitor over the next 24 hours for transition of care, likely hospital death  Questions and concerns addressed   Discussed with Dr Sloan Leiter and bedside RN  Total time spent was 35 minutes    PMT will continue to support holistically  Greater than 50% of the time was spent in counseling and coordination of care  Wadie Lessen NP  Palliative Medicine Team Team Phone # [phone] Pager [phone]

## 2020-01-16 NOTE — Progress Notes (Signed)
Physical Therapy Treatment Patient Details Name: [name] [name] MRN: [id number] DOB: 1946-03-01 Today's Date: 01/16/2020    History of Present Illness Past medical history of COPD on 2 LPM chronically, chronic respiratory failure, chronic CHF, OSA, A. fib, anemia, CKD 3, hypothyroidism, PPM implant.  Presented with a fall that occurred on Easter Sunday.  Patient lives alone and neighbors noted mailbox was full and packages were not picked up.  Significantly confused with multiple skin breakdowns and hypoxia on arrival.  Found to have acute kidney injury, hyperkalemia, hyponatremia, rhabdomyolysis and hypoxia.    PT Comments    Pt's nurse reports she has had a decline and is currently on 15 L HFNC and that family is discussing comfort care. PT/OT with focus of session to place bed in chair position and to work on therapeutic exercise. Pt requires maxAx2 for sliding up in bed and then bed placed in chair position. Despite multiple attempts at positioning pt with pronounced R lateral lean, corrected with pillows. Pt more alert in upright and able to participate in minimal therapeutic exercise. Pt with difficulty maintaining SaO2 > 90%O2 without performance of purse lip breathing. Left in chair position so that pt could better see her children in the room. Upon leaving room RN reported pt had been placed on comfort care. PT will discharge from caseload.     Follow Up Recommendations  SNF;Supervision/Assistance - 24 hour     Equipment Recommendations  None recommended by PT;Other (comment)       Precautions / Restrictions Precautions Precautions: Fall Precaution Comments: multiple areas of skin breakdown Required Braces or Orthoses: Other Brace Other Brace: B prevalon boots Restrictions Weight Bearing Restrictions: No    Mobility  Bed Mobility Overal bed mobility: Needs Assistance Bed Mobility: Supine to Sit           General bed mobility comments: when placed in trendelenberg to  scoot pt up in bed pt able to reach up to headboard with L arm, unable to provide assist.   Transfers                 General transfer comment: placed bed in chair position         Balance Overall balance assessment: Needs assistance Sitting-balance support: Feet supported;Bilateral upper extremity supported Sitting balance-Leahy Scale: Zero Sitting balance - Comments: balance fluctuated between zero and poor. Worked on forward rocking and getting increased trunk/hip flexion. Pt sat for 12+ minutes Postural control: Posterior lean                                  Cognition Arousal/Alertness: Awake/alert Behavior During Therapy: Flat affect(flat affect ) Overall Cognitive Status: Impaired/Different from baseline Area of Impairment: Attention;Safety/judgement;Following commands                 Orientation Level: Situation;Time;Place   Memory: Decreased short-term memory Following Commands: Follows one step commands inconsistently Safety/Judgement: Decreased awareness of safety;Decreased awareness of deficits Awareness: Intellectual Problem Solving: Slow processing;Decreased initiation General Comments: pt family in room, pt reports wanting to get up and go      Exercises General Exercises - Lower Extremity Ankle Circles/Pumps: AROM;10 reps;Seated Short Arc Quad: AROM;Both;5 reps;Seated    General Comments General comments (skin integrity, edema, etc.): Pt on 15L O2 via HFNC with SaO2 dropping into mid 80%'s pt able to self direct herself in pursed lip breathing       Pertinent Vitals/Pain  Pain Assessment: Faces Faces Pain Scale: Hurts little more Pain Location: legs with movement Pain Descriptors / Indicators: Moaning;Grimacing Pain Intervention(s): Limited activity within patient's tolerance;Monitored during session;Repositioned           PT Goals (current goals can now be found in the care plan section) Acute Rehab PT Goals PT Goal  Formulation: With patient Time For Goal Achievement: 01/16/20 Potential to Achieve Goals: Poor Progress towards PT goals: Not progressing toward goals - comment(decreased ability to participate)    Frequency    Min 2X/week      PT Plan Current plan remains appropriate    Co-evaluation PT/OT/SLP Co-Evaluation/Treatment: Yes Reason for Co-Treatment: For patient/therapist safety;Complexity of the patient's impairments (multi-system involvement);Necessary to address cognition/behavior during functional activity          AM-PAC PT "6 Clicks" Mobility   Outcome Measure  Help needed turning from your back to your side while in a flat bed without using bedrails?: Total Help needed moving from lying on your back to sitting on the side of a flat bed without using bedrails?: Total Help needed moving to and from a bed to a chair (including a wheelchair)?: Total Help needed standing up from a chair using your arms (e.g., wheelchair or bedside chair)?: Total Help needed to walk in hospital room?: Total Help needed climbing 3-5 steps with a railing? : Total 6 Click Score: 6    End of Session Equipment Utilized During Treatment: Oxygen Activity Tolerance: Patient limited by pain Patient left: in bed;with call bell/phone within reach;with bed alarm set Nurse Communication: Mobility status PT Visit Diagnosis: Other abnormalities of gait and mobility (R26.89);Muscle weakness (generalized) (M62.81);History of falling (Z91.81)     Time: 1275-1700 PT Time Calculation (min) (ACUTE ONLY): 26 min  Charges:  $Therapeutic Exercise: 8-22 mins                     Ngan Qualls B. Migdalia Dk PT, DPT Acute Rehabilitation Services Pager [phone] Office [phone]    [place] 01/16/2020, 1:21 PM

## 2020-01-16 NOTE — Progress Notes (Addendum)
Occupational Therapy Treatment Patient Details Name: [name] [name] MRN: [id number] DOB: [birth year] Today's Date: 01/16/2020    History of present illness Past medical history of COPD on 2 LPM chronically, chronic respiratory failure, chronic CHF, OSA, A. fib, anemia, CKD 3, hypothyroidism, PPM implant.  Presented with a fall that occurred on Easter Sunday.  Patient lives alone and neighbors noted mailbox was full and packages were not picked up.  Significantly confused with multiple skin breakdowns and hypoxia on arrival.  Found to have acute kidney injury, hyperkalemia, hyponatremia, rhabdomyolysis and hypoxia.   OT comments  OT treatment before discontinuation of orders noted. Pt's nurse reports she has had a decline and is currently on 15 L HFNC and that family is discussing comfort care. Desat and fatigue noted with minimal exertion. Focused session on placing pt in chair position to engage in BADL. Pt able to wash face with min A while in chair position. Overall pt needed increased cues, redirection, and reassurance 2/2 disorientation and lability. Pt is total A for all mobility aspects at this time. Left pt in chair position to better interact with family present. OT will sign off due to comfort measures being placed after time of treatment. Please re consult if any changes arise, thank you.    Follow Up Recommendations  SNF;Supervision/Assistance - 24 hour;Other (comment)(pt is going comfort care)    Equipment Recommendations  None recommended by OT    Recommendations for Other Services      Precautions / Restrictions Precautions Precautions: Fall Precaution Comments: multiple areas of skin breakdown Required Braces or Orthoses: Other Brace Other Brace: B prevalon boots Restrictions Weight Bearing Restrictions: No       Mobility Bed Mobility Overal bed mobility: Needs Assistance Bed Mobility: Supine to Sit           General bed mobility comments: when placed in  trendelenberg to scoot pt up in bed pt able to reach up to headboard with L arm, unable to provide assist. Pt then placed in chair position to engage in BADL and visit with family  Transfers                 General transfer comment: placed bed in chair position     Balance Overall balance assessment: Needs assistance Sitting-balance support: Feet supported;Bilateral upper extremity supported Sitting balance-Leahy Scale: Zero Sitting balance - Comments: balance fluctuated between zero and poor. Worked on forward rocking and getting increased trunk/hip flexion. Pt sat for 12+ minutes Postural control: Posterior lean                                 ADL either performed or assessed with clinical judgement   ADL Overall ADL's : Needs assistance/impaired     Grooming: Minimal assistance;Bed level Grooming Details (indicate cue type and reason): min A using L hand and initiation cue to wash face                               General ADL Comments: Pt with decline in status from previous OT session. Pt placed in chair position in bed to be able to interact with environment and family. Other than grooming and set up bed level tasks, pt currently requires total A for BADLs and may go comfort care     Vision Patient Visual Report: No change from baseline  Perception     Praxis      Cognition Arousal/Alertness: Awake/alert Behavior During Therapy: Flat affect Overall Cognitive Status: Impaired/Different from baseline Area of Impairment: Attention;Safety/judgement;Following commands;Orientation;Memory;Awareness;Problem solving                 Orientation Level: Situation;Time;Place Current Attention Level: Sustained Memory: Decreased short-term memory Following Commands: Follows one step commands inconsistently Safety/Judgement: Decreased awareness of safety;Decreased awareness of deficits Awareness: Intellectual Problem Solving: Slow  processing;Decreased initiation General Comments: pt continues to state she wants to get up and "go" and get out of the bed. Needing cues for redirection, flat affect and labile at times        Exercises Exercises: General Lower Extremity General Exercises - Lower Extremity Ankle Circles/Pumps: AROM;10 reps;Seated Short Arc Quad: AROM;Both;5 reps;Seated   Shoulder Instructions       General Comments Pt on 15L O2 via HFNC with SaO2 dropping into mid 80%'s pt able to self direct herself in pursed lip breathing     Pertinent Vitals/ Pain       Pain Assessment: Faces Faces Pain Scale: Hurts little more Pain Location: legs with movement Pain Descriptors / Indicators: Moaning;Grimacing Pain Intervention(s): Limited activity within patient's tolerance;Monitored during session;Repositioned  Home Living                                          Prior Functioning/Environment              Frequency  Min 2X/week        Progress Toward Goals  OT Goals(current goals can now be found in the care plan section)  Progress towards OT goals: Not progressing toward goals - comment(OT to sign off due to comfort measures to be placed)     Plan Discharge plan needs to be updated    Co-evaluation      Reason for Co-Treatment: For patient/therapist safety;Necessary to address cognition/behavior during functional activity;Complexity of the patient's impairments (multi-system involvement)   OT goals addressed during session: ADL's and self-care;Strengthening/ROM      AM-PAC OT "6 Clicks" Daily Activity     Outcome Measure   Help from another person eating meals?: Total Help from another person taking care of personal grooming?: A Little Help from another person toileting, which includes using toliet, bedpan, or urinal?: Total Help from another person bathing (including washing, rinsing, drying)?: Total Help from another person to put on and taking off regular  upper body clothing?: Total Help from another person to put on and taking off regular lower body clothing?: Total 6 Click Score: 8    End of Session Equipment Utilized During Treatment: Oxygen  OT Visit Diagnosis: Other abnormalities of gait and mobility (R26.89);Repeated falls (R29.6);Muscle weakness (generalized) (M62.81);Other symptoms and signs involving cognitive function;Pain Pain - part of body: Leg   Activity Tolerance Patient limited by lethargy;Patient limited by fatigue   Patient Left in bed;with call bell/phone within reach;with bed alarm set   Nurse Communication Mobility status        Time: 0388-8280 OT Time Calculation (min): 26 min  Charges: OT General Charges $OT Visit: 1 Visit OT Treatments $Self Care/Home Management : 8-22 mins  Zenovia Jarred, MSOT, OTR/L [facility] Office Number: [phone] Pager: [phone]  Zenovia Jarred 01/16/2020, 2:41 PM

## 2020-01-16 NOTE — Progress Notes (Signed)
PROGRESS NOTE    [name] [name]  UEA:[id number] DOB: 06-03-1946 DOA: 01/12/2020 PCP: [name] Borg, MD   Chief Complaint  Patient presents with  . Fall    Brief Narrative:  74 yo F PMH OSA, COPD on 2LNC, diastolic CHF, Afib, CKD III, SSS s/p PPM, PFO, hypothyroidism, depression, breast cancer s/p chemo and morbid obesity who presented to ED 4/10 with AMS after being found down at home. EMS was dispatched to patient's home after neighbors noticed mail was full. Upon EMS arrival, pt was hypoxic SpO2 80% on RA and had pressure ulcers on R side of body, and endorsed global myalgia. Patient reported that she sustained fall approx 6 days prior to EMS dispatch and was unable to stand self.   In ED, pt hypoxic but VBG not overtly hypercarbic. CK 2000, Cr 2.48. Admitted to [facility].  On 4/15, patient with progressively worsening resp status, as well as complex medical picture including rhabdo/AKI s/p fluid restriction due to pulm status, elevated BNP despite fluid restriction and ongoing diuresis.  Transfer to ICU.  She was put on intermittent BiPAP.  Diuresed with IV Lasix with improvement in her breathing.  She was transferred back to [facility] on 4/20.  She has declined over the past several days.  She's had persistent encephalopathy, thought 2/2 acute hypercarbic resp failure with hepatic encephalopathy (and possible contribution from uremia).  She has acute kidney injury and significant volume overload.  She's currently being treated for possible hepatorenal syndrome with midodrine, octreotide, and albumin with lasix on hold. Palliative care is involved and currently a transition to comfort measures is being discussed.  [name] [name], her son, is seeing his mother for the first time today during her hospitalization.  Her daughter, [name] [name], has been a part of palliative discussions for the past several days.   Assessment & Plan:   Active Problems:   LFT elevation   Acute on chronic respiratory failure with hypoxia  (HCC)   AMS (altered mental status)   Non-traumatic rhabdomyolysis   Acute on chronic respiratory failure with hypoxia and hypercapnia (HCC)   Non-pressure chronic ulcer of other part of right foot limited to breakdown of skin ([place])   Fall   Moderate protein-calorie malnutrition ([place])   Idiopathic chronic gout without tophus   Acute respiratory failure with hypoxia ([place])   Palliative care by specialist   DNR (do not resuscitate)   Weakness generalized   Hepatic encephalopathy ([place])   Liver cirrhosis secondary to NASH ([place])   Hypoxia  Acute metabolic encephalopathy:  Multifactorial including respiratory failure.   TSH 6.63.  Free T4 normal.  B12 5.7K.  No obvious signs of infection.  No focal neuro deficit.  CT head without acute finding.  Ammonia rising, 152 on 4/24 -> 80's.   Hypercarbia with acidemia.  She continues to be significantly lethargic.   today.  Seems like her encephalopathy is related to hypercarbia and hepatic encephalopathy at this point. Frequent orientation and delirium precautions.  Acute on chronic RF with hypoxia and hypercarbia: Multifactorial including OSA/possible OHS, COPD and PFO.  -Patient is on high flow oxygen today morning.  Not waking up. -On intermittent Lasix. -Minimum oxygen to maintain saturation above 88% -needs bipap nightly -Continue Brovana, pulmicort, prn albuterol nebs -CXR with mild central pulm vascular congestion -repeat CXR stable cardiomegaly with central pulm vascular congestion  Acute on chronic diastolic heart failure: Echo on 4/11 with EF of 55 to 60%, no RWMA, indeterminate diastolic dysfunction, moderate LAE, severe RAE moderate  TVR, moderate AVR and RVSP of 46.6.  BNP 2000 (higher than baseline).  Still with significant fluid overload clinically.  UOP 1 L 4/23 (foley now out).  Weight down. -On intermittent Lasix. -Midodrine 10 mg 3 times daily for soft blood pressures -Strict intake and output -Sodium restriction -Repeat BNP  elevated -Renal has been c/s, appreciate recs  AKI on CKD-3a/azotemia: Baseline Cr ~1.4> 2.48 (admit)> 2.61 (peak)>> 2.37 on 4/27, fluctuating.  Renal ultrasound without significant finding.  Per renal, At this point in time, possible AKI due to hemodynamics 2/2 diuretics with intravascular depletion + soft BP's with hypoperfusion + possible hepatorenal.  Possible HF -> anasarca.  Still with significant fluid overload on exam. suspects AKI hemodynamic 2/2 diuretics with intravasc depletion + soft BP's with hypoperfusion + poss hepatorenal.  Other poss includes chronic HF causing anasarca.  Pt not dialysis candidate.  For now, treating with HRS protocol.  Lasix has been held.    Hypervolemic hypernatremia: Na 147 (admit)> 156 (peak)> 148 -appreciate renal assistance -free water when able from mental status - D10  Fall at home Debility/physical deconditioning Traumatic rhabdomyolysis-resolved. -PT/OT recommended SNF.  Hypoglycemia: Likely due to poor Po p.o. intake. - D10 and follow POC BG's  Paroxysmal A. Fib: Now in sinus rhythm without medications.  CHA2DS2-VASc score greater than 3. -Continue Eliquis for anticoagulation for now.    SSS s/p PPM-stable.  Macrocytic anemia: B12 level high, folate wnl.  H&H stable. - ferritin low normal, overall labs wnl.  Hypoproliferative retics. - will start PO iron  Hypothyroidism: TSH 6.6. -Continue home Synthroid  Thrombocytopenia: Improving. -Continue monitoring  Dysphagia -Appreciate SLP input-dysphagia 2 diet.  Morbid obesity:Body mass index is 45.23 kg/m. -Challenging issue given patient's comorbidity and immobility.  History of gout: Uric acid elevated but patient denies pain.  Could be due to renal failure. - Follow uric acid intermittently  -restart allopurinol, low dose with aki  Mood Disorder: med rec notes she's not taking many of her psych meds.  Currently still has AMS.  Palliative recommending psych c/s, will  continue to monitor - consider if mental status improves a bit more.  Goal of care:  Remains in very poor clinical status.  Followed by palliative care.  Comfort care and hospice transition discussion in place.  Continue all supportive care until then.   DVT prophylaxis: eliquis Code Status: DNR Family Communication: No family at the bedside. Disposition:   Status is: Inpatient  Remains inpatient appropriate because:Altered mental status, IV treatments appropriate due to intensity of illness or inability to take PO, Inpatient level of care appropriate due to severity of illness and continued need for IV diuresis, monitoring of renal function in setting of volume overload   Dispo: The patient is from: Home              Anticipated d/c is to: SNF versus inpatient hospice              Anticipated d/c date is: 3 days, unknown              Patient currently is not medically stable to d/c.  Consultants:   Nephrology  Palliative medicine  Procedures:  Echo IMPRESSIONS    1. Limted bubble study Bi atrial enlargement with pacing wire in RA/RV.  Mobile redundant atrial septum with positive bubble study for right to  left shunt fairly large amount of bubbles crossing septum.   Echo IMPRESSIONS    1. Left ventricular ejection fraction, by estimation, is 55  to 60%. The  left ventricle has normal function. The left ventricle has no regional  wall motion abnormalities. Left ventricular diastolic parameters are  indeterminate.  2. Right ventricular systolic function is normal. The right ventricular  size is normal. There is moderately elevated pulmonary artery systolic  pressure.  3. Left atrial size was moderately dilated.  4. Right atrial size was severely dilated.  5. The mitral valve is grossly normal. Mild to moderate mitral valve  regurgitation.  6. Tricuspid valve regurgitation is moderate.  7. The aortic valve is tricuspid. Aortic valve regurgitation is moderate.   Mild aortic valve stenosis.   LE [place] Summary:  RIGHT:  - Findings appear essentially unchanged compared to previous examination.  - There is no evidence of deep vein thrombosis in the lower extremity.  However, portions of this examination were limited- see technologist  comments above.    LEFT:  - There is no evidence of deep vein thrombosis in the lower extremity.  However, portions of this examination were limited- see technologist  comments above.   UE [place] Summary:    Right:  No evidence of deep vein thrombosis in the upper extremity. However,  unable to  visualize the axillary, cephalic, and basilic veins.    Left:  No evidence of deep vein thrombosis in the upper extremity. However,  unable to  visualize the cephalic, basilic, and internal jugular veins.    Antimicrobials:  Anti-infectives (From admission, onward)   Start     Dose/Rate Route Frequency Ordered Stop   01/12/20 2200  rifaximin (XIFAXAN) tablet 550 mg     550 mg Oral 2 times daily 01/12/20 1803     01/12/20 1930  cefTRIAXone (ROCEPHIN) 2 g in sodium chloride 0.9 % 100 mL IVPB     2 g 200 mL/hr over 30 Minutes Intravenous Every 24 hours 01/12/20 1841     01/02/20 0600  vancomycin (VANCOREADY) IVPB 1250 mg/250 mL  Status:  Discontinued     1,250 mg 166.7 mL/hr over 90 Minutes Intravenous Every 48 hours 12/31/19 0801 01/01/20 0757   12/31/19 1700  cefTRIAXone (ROCEPHIN) 2 g in sodium chloride 0.9 % 100 mL IVPB  Status:  Discontinued     2 g 200 mL/hr over 30 Minutes Intravenous Every 24 hours 12/31/19 1250 01/01/20 0757   12/31/19 1000  aztreonam (AZACTAM) 1 g in sodium chloride 0.9 % 100 mL IVPB  Status:  Discontinued     1 g 200 mL/hr over 30 Minutes Intravenous Every 8 hours 12/31/19 0801 12/31/19 1250   12/31/19 0900  vancomycin (VANCOREADY) IVPB 2000 mg/400 mL     2,000 mg 200 mL/hr over 120 Minutes Intravenous  Once 12/31/19 0801 12/31/19 1221   12/31/19 0800  metroNIDAZOLE (FLAGYL) IVPB 500  mg  Status:  Discontinued     500 mg 100 mL/hr over 60 Minutes Intravenous Every 8 hours 12/31/19 0753 01/01/20 0757      Subjective: Patient seen and examined.  No overnight events.  Stayed on BiPAP all night.  Nursing staff were trying to give her bedside care, she was hardly participating.  He did not respond much or open her eyes to me.  Denies any pain.  Remains on high flow nasal cannula oxygen.  Objective: Vitals:   01/16/20 0700 01/16/20 0810 01/16/20 0812 01/16/20 0933  BP:   109/73 (!) 97/51  Pulse:  92 96 81  Resp:  (!) 22  18  Temp:   98 F (36.7 C) 97.7 F (  36.5 C)  TempSrc:   Oral Oral  SpO2: 91% (!) 83% 99% 94%  Weight:      Height:        Intake/Output Summary (Last 24 hours) at 01/16/2020 1044 Last data filed at 01/16/2020 0700 Gross per 24 hour  Intake 1320.46 ml  Output 200 ml  Net 1120.46 ml   Filed Weights   01/12/20 0004 01/14/20 0405 01/15/20 0100  Weight: 128.9 kg 130.2 kg 131 kg    Examination:  General: No acute distress.  Chronically sick looking. Cardiovascular: Heart sounds show a regular rate, and rhythm.  Pacemaker in place. Lungs: Clear to auscultation bilaterally, poor airway sounds. Abdomen: Soft, nontender, nondistended, obese and pendulous. Neurological: Sleepy and lethargic.  Moves all extremities 4. Cranial nerves II through XII grossly intact. Skin: Warm and dry. No rashes or lesions. Extremities: anasarca    Data Reviewed: I have personally reviewed following labs and imaging studies  CBC: Recent Labs  Lab 01/10/20 0423 01/11/20 0719 01/12/20 0620 01/14/20 0635 01/15/20 0459  WBC 5.6 5.8 5.3 6.2 4.8  NEUTROABS  --   --  4.0 4.1 3.6  HGB 9.2* 9.5* 9.2* 9.3* 8.3*  HCT 31.7* 32.9* 32.4* 32.8* 29.3*  MCV 102.6* 103.1* 105.9* 106.8* 106.9*  PLT 178 215 224 240 884    Basic Metabolic Panel: Recent Labs  Lab 01/10/20 0423 01/11/20 0719 01/12/20 0620 01/14/20 0635 01/15/20 0459  NA 145 146* 146* 148* 148*  K 4.4  4.4 4.4 5.0 5.5*  CL 106 106 105 104 105  CO2 30 32 31 29 31   GLUCOSE 88 103* 95 90 100*  BUN 91* 91* 94* 101* 106*  CREATININE 2.08* 1.97* 2.08* 2.19* 2.37*  CALCIUM 9.6 9.6 9.6 10.0 9.9  MG 2.3 2.3 2.4 2.7* 2.6*  PHOS 3.7  --  4.7* 4.8* 5.4*    GFR: Estimated Creatinine Clearance: 29.8 mL/min (A) (by C-G formula based on SCr of 2.37 mg/dL (H)).  Liver Function Tests: Recent Labs  Lab 01/10/20 0423 01/11/20 0719 01/12/20 0620 01/14/20 0635 01/15/20 0459  AST 78* 69* 59* 54* 28  ALT 49* 60* 59* 48* 32  ALKPHOS 273* 305* 323* 331* 241*  BILITOT 0.8 1.1 0.8 1.3* 1.6*  PROT 6.1* 6.2* 6.4* 7.0 6.2*  ALBUMIN 2.6*  2.6* 2.7* 2.7* 3.7 3.7    CBG: Recent Labs  Lab 01/15/20 1700 01/15/20 2000 01/16/20 0012 01/16/20 0404 01/16/20 0754  GLUCAP 200* 126* 123* 114* 121*     Recent Results (from the past 240 hour(s))  Respiratory Panel by RT PCR (Flu A&B, Covid) - Nasopharyngeal Swab     Status: None   Collection Time: 01/09/20  2:41 PM   Specimen: Nasopharyngeal Swab  Result Value Ref Range Status   SARS Coronavirus 2 by RT PCR NEGATIVE NEGATIVE Final    Comment: (NOTE) SARS-CoV-2 target nucleic acids are NOT DETECTED. The SARS-CoV-2 RNA is generally detectable in upper respiratoy specimens during the acute phase of infection. The lowest concentration of SARS-CoV-2 viral copies this assay can detect is 131 copies/mL. A negative result does not preclude SARS-Cov-2 infection and should not be used as the sole basis for treatment or other patient management decisions. A negative result may occur with  improper specimen collection/handling, submission of specimen other than nasopharyngeal swab, presence of viral mutation(s) within the areas targeted by this assay, and inadequate number of viral copies (<131 copies/mL). A negative result must be combined with clinical observations, patient history, and epidemiological information. The expected  result is Negative. Fact  Sheet for Patients:  PinkCheek.be Fact Sheet for Healthcare Providers:  GravelBags.it This test is not yet ap proved or cleared by the [place] FDA and  has been authorized for detection and/or diagnosis of SARS-CoV-2 by FDA under an Emergency Use Authorization (EUA). This EUA will remain  in effect (meaning this test can be used) for the duration of the COVID-19 declaration under Section 564(b)(1) of the Act, 21 U.S.C. section 360bbb-3(b)(1), unless the authorization is terminated or revoked sooner.    Influenza A by PCR NEGATIVE NEGATIVE Final   Influenza B by PCR NEGATIVE NEGATIVE Final    Comment: (NOTE) The Xpert Xpress SARS-CoV-2/FLU/RSV assay is intended as an aid in  the diagnosis of influenza from Nasopharyngeal swab specimens and  should not be used as a sole basis for treatment. Nasal washings and  aspirates are unacceptable for Xpert Xpress SARS-CoV-2/FLU/RSV  testing. Fact Sheet for Patients: PinkCheek.be Fact Sheet for Healthcare Providers: GravelBags.it This test is not yet approved or cleared by the [place] FDA and  has been authorized for detection and/or diagnosis of SARS-CoV-2 by  FDA under an Emergency Use Authorization (EUA). This EUA will remain  in effect (meaning this test can be used) for the duration of the  Covid-19 declaration under Section 564(b)(1) of the Act, 21  U.S.C. section 360bbb-3(b)(1), unless the authorization is  terminated or revoked. Performed at [facility], [place] [address] [place], [place] [address]          Radiology Studies: DG CHEST PORT 1 VIEW  Result Date: 01/15/2020 CLINICAL DATA:  Shortness of breath. EXAM: PORTABLE CHEST 1 VIEW COMPARISON:  January 14, 2020. FINDINGS: Stable cardiomegaly with central pulmonary vascular congestion. Left-sided pacemaker is unchanged in position. No pneumothorax or  pleural effusion is noted. No consolidative process is noted. Bony thorax is unremarkable. IMPRESSION: Stable cardiomegaly with central pulmonary vascular congestion. Electronically Signed   By: Marijo Conception M.D.   On: 01/15/2020 12:41   DG CHEST PORT 1 VIEW  Result Date: 01/14/2020 CLINICAL DATA:  Hypoxia. EXAM: PORTABLE CHEST 1 VIEW COMPARISON:  January 12, 2020. FINDINGS: Stable cardiomegaly with central pulmonary vascular congestion is noted. Left-sided pacemaker is unchanged in position. No pneumothorax or pleural effusion is noted. No significant consolidative process is noted. Bony thorax is unremarkable. IMPRESSION: Stable cardiomegaly with central pulmonary vascular congestion. Electronically Signed   By: Marijo Conception M.D.   On: 01/14/2020 13:20        Scheduled Meds: . allopurinol  50 mg Oral Daily  . apixaban  5 mg Oral BID  . arformoterol  15 mcg Nebulization BID  . budesonide (PULMICORT) nebulizer solution  0.25 mg Nebulization BID  . famotidine  20 mg Oral Daily  . feeding supplement (ENSURE ENLIVE)  237 mL Oral TID BM  . ferrous sulfate  325 mg Oral Q breakfast  . folic acid  1 mg Oral Daily  . free water  200 mL Oral Q4H  . lactulose  30 g Oral QID  . levothyroxine  200 mcg Oral Q0600  . midodrine  10 mg Oral TID WC  . octreotide  200 mcg Subcutaneous Q12H  . rifaximin  550 mg Oral BID  . sodium chloride flush  10-40 mL Intracatheter Q12H  . thiamine  100 mg Oral Daily  . vitamin B-12  1,000 mcg Oral Daily   Continuous Infusions: . sodium chloride Stopped (01/13/20 2130)  . albumin human 25 g (  01/16/20 0519)  . cefTRIAXone (ROCEPHIN)  IV Stopped (01/15/20 2006)  . dextrose 30 mL/hr at 01/16/20 0400     LOS: 18 days    Time spent: 30 min    Barb Merino, MD [facility]

## 2020-01-16 NOTE — Progress Notes (Signed)
SLP Cancellation Note  Patient Details Name: [name] [name] MRN: [id number] DOB: [birth year]   Cancelled treatment:       Reason Eval/Treat Not Completed: Medical issues which prohibited therapy. Pt currently requiring BiPAP or HFNC. RN reports poor po intake. Per palliative care team, family considering transition to comfort care. Will follow.   Livvy Spilman B. Quentin Ore, [facility], [place] Speech Language Pathologist Office: [phone] Pager: [phone]  Shonna Chock 01/16/2020, 10:24 AM

## 2020-01-17 DIAGNOSIS — J9621 Acute and chronic respiratory failure with hypoxia: Secondary | ICD-10-CM | POA: Diagnosis not present

## 2020-01-17 DIAGNOSIS — R0609 Other forms of dyspnea: Secondary | ICD-10-CM | POA: Diagnosis not present

## 2020-01-17 DIAGNOSIS — Z515 Encounter for palliative care: Secondary | ICD-10-CM | POA: Diagnosis not present

## 2020-01-17 DIAGNOSIS — R400 Somnolence: Secondary | ICD-10-CM | POA: Diagnosis not present

## 2020-01-17 MED ORDER — GLYCOPYRROLATE 0.2 MG/ML IJ SOLN
0.4000 mg | Freq: Four times a day (QID) | INTRAMUSCULAR | Status: DC
  Administered 2020-01-17: 0.4 mg via INTRAVENOUS
  Filled 2020-01-17: qty 2

## 2020-01-19 NOTE — Progress Notes (Signed)
Pt had agonal breathing since the shift change and has been medicated to control pain and discomfort. Pt has passed on at 11-26-40 and the family members were at the bedside. On call provider has been paged to sign the death certificate.

## 2020-01-19 NOTE — Plan of Care (Signed)
  Problem: Education: Goal: Knowledge of General Education information will improve Description: Including pain rating scale, medication(s)/side effects and non-pharmacologic comfort measures Outcome: Not Progressing   Problem: Health Behavior/Discharge Planning: Goal: Ability to manage health-related needs will improve Outcome: Not Progressing   Problem: Activity: Goal: Risk for activity intolerance will decrease Outcome: Not Progressing   Problem: Nutrition: Goal: Adequate nutrition will be maintained Outcome: Not Progressing   Problem: Safety: Goal: Ability to remain free from injury will improve Outcome: Not Progressing   Problem: Skin Integrity: Goal: Risk for impaired skin integrity will decrease Outcome: Not Progressing

## 2020-01-19 NOTE — Progress Notes (Signed)
PROGRESS NOTE    KENZLEE FISHBURN  JQB:[id number] DOB: [birth year] DOA: 12/26/2019 PCP: Biagio Borg, MD   Chief Complaint  Patient presents with  . Fall    Brief Narrative:  74 yo F PMH OSA, COPD on 2LNC, diastolic CHF, Afib, CKD III, SSS s/p PPM, PFO, hypothyroidism, depression, breast cancer s/p chemo and morbid obesity who presented to ED 4/10 with AMS after being found down at home. EMS was dispatched to patient's home after neighbors noticed mail was full. Upon EMS arrival, pt was hypoxic SpO2 80% on RA and had pressure ulcers on R side of body, and endorsed global myalgia. Patient reported that she sustained fall approx 6 days prior to EMS dispatch and was unable to stand self.   In ED, pt hypoxic but VBG not overtly hypercarbic. CK 2000, Cr 2.48. Admitted to [facility].  On 4/15, patient with progressively worsening resp status, as well as complex medical picture including rhabdo/AKI s/p fluid restriction due to pulm status, elevated BNP despite fluid restriction and ongoing diuresis.  Transfer to ICU.  She was put on intermittent BiPAP.  Diuresed with IV Lasix with improvement in her breathing.  She was transferred back to [facility] on 4/20.  She has declined over the past several days.  She's had persistent encephalopathy, thought 2/2 acute hypercarbic resp failure with hepatic encephalopathy (and possible contribution from uremia).  She has acute kidney injury and significant volume overload.  With no meaningful recovery and deterioration of clinical status, comfort care started on 4/28.  Assessment & Plan:   Active Problems:   End of life care   LFT elevation   Dyspnea   Acute on chronic respiratory failure with hypoxia (HCC)   AMS (altered mental status)   Non-traumatic rhabdomyolysis   Acute on chronic respiratory failure with hypoxia and hypercapnia (HCC)   Non-pressure chronic ulcer of other part of right foot limited to breakdown of skin ([place])   Fall   Moderate protein-calorie  malnutrition (HCC)   Idiopathic chronic gout without tophus   Acute respiratory failure with hypoxia ([place])   Palliative care by specialist   DNR (do not resuscitate)   Weakness generalized   Hepatic encephalopathy ([place])   Liver cirrhosis secondary to NASH ([place])   Hypoxia  Acute metabolic encephalopathy:  Multifactorial including respiratory failure.  Acute on chronic RF with hypoxia and hypercarbia: Multifactorial including OSA/possible OHS, COPD and PFO.  Acute on chronic diastolic heart failure: Echo on 4/11 with EF of 55 to 60%, no RWMA, indeterminate diastolic dysfunction, moderate LAE, severe RAE moderate TVR, moderate AVR and RVSP of 46.6.  BNP 2000 (higher than baseline).  Still with significant fluid overload clinically.  AKI on CKD-3a/azotemia: Baseline Cr ~1.4> 2.48 (admit)> 2.61 (peak)>> 2.37 on 4/27, fluctuating.  Renal ultrasound without significant finding. Hypervolemic hypernatremia: Na 147 (admit)> 156 (peak)> 148 Fall at home Debility/physical deconditioning Traumatic rhabdomyolysis-resolved. Paroxysmal A. Fib: Now in sinus rhythm without medications.  CHA2DS2-VASc score greater than  SSS s/p PPM-stable. Macrocytic anemia: B12 level high, folate wnl.  H&H stable. Hypothyroidism: TSH 6.6.  Goal of care: After careful discussion.  Patient with poor clinical recovery and worsening medical conditions.  Son and daughter at the bedside. 4/28, full comfort care measures and transition to hospice. All comfort care medications available. RN to pronounce death. May anticipate hospital death.  DVT prophylaxis: Comfort care Code Status: DNR Family Communication: Son and daughter at bedside. Disposition:   Status is: Inpatient  Remains inpatient appropriate because:Hemodynamically unstable  Dispo: The patient is from: Home              Anticipated d/c is to: Anticipate hospital death              Anticipated d/c date is: 1 day              Patient currently is  not medically stable to d/c.  Except inpatient hospice       Consultants:   Nephrology  Palliative medicine  Procedures:  Echo IMPRESSIONS    1. Limted bubble study Bi atrial enlargement with pacing wire in RA/RV.  Mobile redundant atrial septum with positive bubble study for right to  left shunt fairly large amount of bubbles crossing septum.   Echo IMPRESSIONS    1. Left ventricular ejection fraction, by estimation, is 55 to 60%. The  left ventricle has normal function. The left ventricle has no regional  wall motion abnormalities. Left ventricular diastolic parameters are  indeterminate.  2. Right ventricular systolic function is normal. The right ventricular  size is normal. There is moderately elevated pulmonary artery systolic  pressure.  3. Left atrial size was moderately dilated.  4. Right atrial size was severely dilated.  5. The mitral valve is grossly normal. Mild to moderate mitral valve  regurgitation.  6. Tricuspid valve regurgitation is moderate.  7. The aortic valve is tricuspid. Aortic valve regurgitation is moderate.  Mild aortic valve stenosis.   LE [place] Summary:  RIGHT:  - Findings appear essentially unchanged compared to previous examination.  - There is no evidence of deep vein thrombosis in the lower extremity.  However, portions of this examination were limited- see technologist  comments above.    LEFT:  - There is no evidence of deep vein thrombosis in the lower extremity.  However, portions of this examination were limited- see technologist  comments above.   UE [place] Summary:    Right:  No evidence of deep vein thrombosis in the upper extremity. However,  unable to  visualize the axillary, cephalic, and basilic veins.    Left:  No evidence of deep vein thrombosis in the upper extremity. However,  unable to  visualize the cephalic, basilic, and internal jugular veins.    Antimicrobials:  Anti-infectives (From  admission, onward)   Start     Dose/Rate Route Frequency Ordered Stop   01/12/20 2200  rifaximin (XIFAXAN) tablet 550 mg  Status:  Discontinued     550 mg Oral 2 times daily 01/12/20 1803 01/16/20 1205   01/12/20 1930  cefTRIAXone (ROCEPHIN) 2 g in sodium chloride 0.9 % 100 mL IVPB  Status:  Discontinued     2 g 200 mL/hr over 30 Minutes Intravenous Every 24 hours 01/12/20 1841 01/16/20 1205   01/02/20 0600  vancomycin (VANCOREADY) IVPB 1250 mg/250 mL  Status:  Discontinued     1,250 mg 166.7 mL/hr over 90 Minutes Intravenous Every 48 hours 12/31/19 0801 01/01/20 0757   12/31/19 1700  cefTRIAXone (ROCEPHIN) 2 g in sodium chloride 0.9 % 100 mL IVPB  Status:  Discontinued     2 g 200 mL/hr over 30 Minutes Intravenous Every 24 hours 12/31/19 1250 01/01/20 0757   12/31/19 1000  aztreonam (AZACTAM) 1 g in sodium chloride 0.9 % 100 mL IVPB  Status:  Discontinued     1 g 200 mL/hr over 30 Minutes Intravenous Every 8 hours 12/31/19 0801 12/31/19 1250   12/31/19 0900  vancomycin (VANCOREADY) IVPB 2000 mg/400 mL  2,000 mg 200 mL/hr over 120 Minutes Intravenous  Once 12/31/19 0801 12/31/19 1221   12/31/19 0800  metroNIDAZOLE (FLAGYL) IVPB 500 mg  Status:  Discontinued     500 mg 100 mL/hr over 60 Minutes Intravenous Every 8 hours 12/31/19 0753 01/01/20 0757      Subjective: Patient seen and examined.  Children at the bedside.  She has mostly remained obtunded.  Was recently medicated.  She looks comfortable.  Unable to communicate.  Objective: Vitals:   01/16/20 0812 01/16/20 0933 01/16/20 2018 02-07-2020 0858  BP: 109/73 (!) 97/51 (!) 85/53   Pulse: 96 81    Resp:  18 (!) 21   Temp: 98 F (36.7 C) 97.7 F (36.5 C) (!) 97.2 F (36.2 C)   TempSrc: Oral Oral    SpO2: 99% 94% (!) 83% (!) 83%  Weight:      Height:        Intake/Output Summary (Last 24 hours) at 2020/02/07 1008 Last data filed at 2020-02-07 0854 Gross per 24 hour  Intake 0 ml  Output 50 ml  Net -50 ml   Filed  Weights   01/12/20 0004 01/14/20 0405 01/15/20 0100  Weight: 128.9 kg 130.2 kg 131 kg    Examination:  General: Chronically sick looking.  She is not in any distress.  She is unresponsive to voice and medicated for comfort care. Cardiovascular: Heart sounds show a regular rate, and rhythm.  Pacemaker in place. Lungs: Clear to auscultation bilaterally, poor airway sounds. Extremities: anasarca    Data Reviewed: I have personally reviewed following labs and imaging studies  CBC: Recent Labs  Lab 01/11/20 0719 01/12/20 0620 01/14/20 0635 01/15/20 0459  WBC 5.8 5.3 6.2 4.8  NEUTROABS  --  4.0 4.1 3.6  HGB 9.5* 9.2* 9.3* 8.3*  HCT 32.9* 32.4* 32.8* 29.3*  MCV 103.1* 105.9* 106.8* 106.9*  PLT 215 224 240 939    Basic Metabolic Panel: Recent Labs  Lab 01/11/20 0719 01/12/20 0620 01/14/20 0635 01/15/20 0459  NA 146* 146* 148* 148*  K 4.4 4.4 5.0 5.5*  CL 106 105 104 105  CO2 32 31 29 31   GLUCOSE 103* 95 90 100*  BUN 91* 94* 101* 106*  CREATININE 1.97* 2.08* 2.19* 2.37*  CALCIUM 9.6 9.6 10.0 9.9  MG 2.3 2.4 2.7* 2.6*  PHOS  --  4.7* 4.8* 5.4*    GFR: Estimated Creatinine Clearance: 29.8 mL/min (A) (by C-G formula based on SCr of 2.37 mg/dL (H)).  Liver Function Tests: Recent Labs  Lab 01/11/20 0719 01/12/20 0620 01/14/20 0635 01/15/20 0459  AST 69* 59* 54* 28  ALT 60* 59* 48* 32  ALKPHOS 305* 323* 331* 241*  BILITOT 1.1 0.8 1.3* 1.6*  PROT 6.2* 6.4* 7.0 6.2*  ALBUMIN 2.7* 2.7* 3.7 3.7    CBG: Recent Labs  Lab 01/15/20 1700 01/15/20 2000 01/16/20 0012 01/16/20 0404 01/16/20 0754  GLUCAP 200* 126* 123* 114* 121*     Recent Results (from the past 240 hour(s))  Respiratory Panel by RT PCR (Flu A&B, Covid) - Nasopharyngeal Swab     Status: None   Collection Time: 01/09/20  2:41 PM   Specimen: Nasopharyngeal Swab  Result Value Ref Range Status   SARS Coronavirus 2 by RT PCR NEGATIVE NEGATIVE Final    Comment: (NOTE) SARS-CoV-2 target nucleic  acids are NOT DETECTED. The SARS-CoV-2 RNA is generally detectable in upper respiratoy specimens during the acute phase of infection. The lowest concentration of SARS-CoV-2 viral copies this assay  can detect is 131 copies/mL. A negative result does not preclude SARS-Cov-2 infection and should not be used as the sole basis for treatment or other patient management decisions. A negative result may occur with  improper specimen collection/handling, submission of specimen other than nasopharyngeal swab, presence of viral mutation(s) within the areas targeted by this assay, and inadequate number of viral copies (<131 copies/mL). A negative result must be combined with clinical observations, patient history, and epidemiological information. The expected result is Negative. Fact Sheet for Patients:  PinkCheek.be Fact Sheet for Healthcare Providers:  GravelBags.it This test is not yet ap proved or cleared by the [place] FDA and  has been authorized for detection and/or diagnosis of SARS-CoV-2 by FDA under an Emergency Use Authorization (EUA). This EUA will remain  in effect (meaning this test can be used) for the duration of the COVID-19 declaration under Section 564(b)(1) of the Act, 21 U.S.C. section 360bbb-3(b)(1), unless the authorization is terminated or revoked sooner.    Influenza A by PCR NEGATIVE NEGATIVE Final   Influenza B by PCR NEGATIVE NEGATIVE Final    Comment: (NOTE) The Xpert Xpress SARS-CoV-2/FLU/RSV assay is intended as an aid in  the diagnosis of influenza from Nasopharyngeal swab specimens and  should not be used as a sole basis for treatment. Nasal washings and  aspirates are unacceptable for Xpert Xpress SARS-CoV-2/FLU/RSV  testing. Fact Sheet for Patients: PinkCheek.be Fact Sheet for Healthcare Providers: GravelBags.it This test is not yet  approved or cleared by the [place] FDA and  has been authorized for detection and/or diagnosis of SARS-CoV-2 by  FDA under an Emergency Use Authorization (EUA). This EUA will remain  in effect (meaning this test can be used) for the duration of the  Covid-19 declaration under Section 564(b)(1) of the Act, 21  U.S.C. section 360bbb-3(b)(1), unless the authorization is  terminated or revoked. Performed at [facility], [place] [address] [place], [place] [address]          Radiology Studies: DG CHEST PORT 1 VIEW  Result Date: 01/15/2020 CLINICAL DATA:  Shortness of breath. EXAM: PORTABLE CHEST 1 VIEW COMPARISON:  January 14, 2020. FINDINGS: Stable cardiomegaly with central pulmonary vascular congestion. Left-sided pacemaker is unchanged in position. No pneumothorax or pleural effusion is noted. No consolidative process is noted. Bony thorax is unremarkable. IMPRESSION: Stable cardiomegaly with central pulmonary vascular congestion. Electronically Signed   By: Marijo Conception M.D.   On: 01/15/2020 12:41        Scheduled Meds: . arformoterol  15 mcg Nebulization BID  . budesonide (PULMICORT) nebulizer solution  0.25 mg Nebulization BID  . feeding supplement (ENSURE ENLIVE)  237 mL Oral TID BM  . sodium chloride flush  10-40 mL Intracatheter Q12H   Continuous Infusions: . sodium chloride Stopped (01/13/20 2130)     LOS: 19 days    Time spent:25 min    Barb Merino, MD [facility]

## 2020-01-19 NOTE — Progress Notes (Signed)
Patient ID: [name] [name], female   DOB: [birth year], 74 y.o.   MRN: [id number]  This NP spoke to treatment team and then  with daughter/[name] [name] and son/[name] [name]/ by telephone for continued conversation regarding current medical situation.  Family is at peace with decision for shift to a full comfort path.  Family understands that time is limited.  Prognosis is likely hours to days.  Education offered regarding the natural trajectory and expectations at EOL. Education regarding utilization medications for symptom managemtn   Emotional support offered.   Plan of Care: -DNR/DNI -comfort and dignity are focus of care, Allow A Natural Death -no artifical feeding or hydration now or in the future -no further diagnostics, or life prolonging measures -no Bipap, wean HFNC to nasal cannula -symptom management specific to pain and dyspnea    -Morphine 2 mg IV every 1 hr prn    -Ativan 2 mg IV every 6 hrs -expect likely hospital death, no need to consider transition from hospita  Questions and concerns addressed   Discussed with Dr Sloan Leiter and bedside RN  Total time spent was 35 minutes    PMT will continue to support holistically  Greater than 50% of the time was spent in counseling and coordination of care  Wadie Lessen NP  Palliative Medicine Team Team Phone # [phone] Pager [phone]

## 2020-01-19 NOTE — Telephone Encounter (Signed)
Letter sent 01-24-20

## 2020-01-19 DEATH — deceased

## 2020-02-19 NOTE — Death Summary Note (Signed)
DEATH SUMMARY   Patient Details  Name: [name] [name] MRN: [id number] DOB: 12-09-1945  Admission/Discharge Information   Admit Date:  01/12/20  Date of Death: Date of Death: Jan 31, 2020  Time of Death: Time of Death: 28-Nov-2240  Length of Stay: Nov 23, 2022  Referring Physician: Biagio Borg, MD   Reason(s) for Hospitalization  Acute on chronic respiratory failure with hypoxia, altered mental status and hepatic encephalopathy.  Diagnoses  Preliminary cause of death:  Secondary Diagnoses (including complications and co-morbidities):  Active Problems:   End of life care   LFT elevation   Dyspnea   Acute on chronic respiratory failure with hypoxia (HCC)   AMS (altered mental status)   Non-traumatic rhabdomyolysis   Acute on chronic respiratory failure with hypoxia and hypercapnia (HCC)   Non-pressure chronic ulcer of other part of right foot limited to breakdown of skin ([place])   Fall   Moderate protein-calorie malnutrition (HCC)   Idiopathic chronic gout without tophus   Acute respiratory failure with hypoxia ([place])   Palliative care by specialist   DNR (do not resuscitate)   Weakness generalized   Hepatic encephalopathy ([place])   Liver cirrhosis secondary to NASH [facility])   Hypoxia   Brief Hospital Course (including significant findings, care, treatment, and services provided and events leading to death)  [name] [name] is a 74 y.o. year old female who suffered from extensive medical issues including obstructive sleep apnea, COPD on oxygen at home, diastolic heart failure, chronic A. fib, chronic kidney disease stage III, sick sinus syndrome status post pacemaker, hypothyroidism, depression, breast cancer status post chemo who presented to the emergency room on 01/12/23 with altered mental status after being found down at home.  EMS was dispatched to patient's home after neighbors noticed her mailbox was full.  When EMS arrived, patient was hypoxic with saturation 80% on room air, she had pressure ulcers  on the right side of her body and was complaining of whole body ache.  Apparently, patient fell 6 days prior to EMS dispatch and had been laying on the floor. Patient was admitted to hospital and treated for multiple different conditions.  She was treated for rhabdomyolysis, acute kidney injury, fluid overload.  She was also transferred to ICU.  Treated with intermittent BiPAP.  Patient had poor clinical recovery.  She continued to decline.  She was persistently encephalopathic.  With no meaningful recovery and deterioration of clinical status comfort care measures were started on 4/28. Patient was provided with end-of-life care in the hospital and ultimately she died on Jan 31, 2020 at Nov 28, 2240 with family at bedside.    Pertinent Labs and Studies  Significant Diagnostic Studies DG Chest 1 View  Result Date: 12/30/2019 CLINICAL DATA:  Central line placement EXAM: CHEST  1 VIEW COMPARISON:  Chest x-ray 12/30/2019 at 10:19 a.m. FINDINGS: Dual lead pacer device remains in place. Left-sided central venous catheter takes an unusual course, extending along the left upper chest, passing beyond the expected boundary of the left brachiocephalic vein and extending along the left lateral margin of the thoracic aorta in the superior mediastinum. Heart size remains enlarged. No signs of large effusion on the current study. No pneumothorax on semi erect view. Lungs are clear. Visualized skeletal structures without acute finding. Cervical spinal fusion as before. IMPRESSION: Abnormal course of left sided central venous line, this may be within a tributary of the brachiocephalic venous access system, sub primum intercostal vein or hemi azygous branch, potentially also with arterial placement based on position. Extra-venous extension  into the mediastinum or pleural space is also considered. Chest CT may be helpful to determine next steps for management. Cardiomegaly with dual lead pacer device. Critical Value/emergent results  were called by telephone at the time of interpretation on 12/30/2019 at 11:57 a.m. to provider NP Noe Gens, who verbally acknowledged these results. Electronically Signed   By: Zetta Bills M.D.   On: 12/30/2019 12:15   DG Elbow 2 Views Right  Result Date: 12/30/2019 CLINICAL DATA:  Possible pressure ulcer on right elbow. EXAM: RIGHT ELBOW - 2 VIEW COMPARISON:  None. FINDINGS: Bandaging is seen over the posterior right elbow. No fracture or joint effusion identified. No bony erosion. No foreign body noted. IMPRESSION: Soft tissue edema. No fracture, erosion, foreign body, or bony ulceration. Electronically Signed   By: Dorise Bullion III M.D   On: 12/30/2019 12:30   CT HEAD WO CONTRAST  Result Date: 12/30/2019 CLINICAL DATA:  74 year old female with encephalopathy. Recent fall. EXAM: CT HEAD WITHOUT CONTRAST TECHNIQUE: Contiguous axial images were obtained from the base of the skull through the vertex without intravenous contrast. COMPARISON:  Head and cervical spine CT yesterday. Head CT 11/01/2019 and earlier. FINDINGS: Brain: Gray-white matter differentiation appears stable and within normal limits for age. No midline shift, ventriculomegaly, mass effect, evidence of mass lesion, intracranial hemorrhage or evidence of cortically based acute infarction. There are multiple scattered small punctate calcifications in the subarachnoid spaces, mostly in the left hemisphere. This is stable and probably postinflammatory, rather than due to distal vessel calcified atherosclerosis. Vascular: Calcified atherosclerosis at the skull base. No suspicious intracranial vascular hyperdensity. Skull: No acute osseous abnormality identified. Sinuses/Orbits: Visualized paranasal sinuses and mastoids are stable and well pneumatized. Other: Broad-based right side scalp hematoma appears stable. No scalp soft tissue gas. Orbits soft tissues appear intact. IMPRESSION: 1. Stable and negative for age non contrast CT appearance  of the brain. 2. Broad-based right scalp hematoma. Electronically Signed   By: Genevie Ann M.D.   On: 12/30/2019 17:05   CT Head Wo Contrast  Result Date: 01/07/2020 CLINICAL DATA:  Fall. EXAM: CT HEAD WITHOUT CONTRAST CT CERVICAL SPINE WITHOUT CONTRAST TECHNIQUE: Multidetector CT imaging of the head and cervical spine was performed following the standard protocol without intravenous contrast. Multiplanar CT image reconstructions of the cervical spine were also generated. COMPARISON:  None. FINDINGS: CT HEAD FINDINGS Brain: No acute intracranial hemorrhage. No focal mass lesion. No CT evidence of acute infarction. No midline shift or mass effect. No hydrocephalus. Basilar cisterns are patent. There are periventricular and subcortical white matter hypodensities. Generalized cortical atrophy. Vascular: No hyperdense vessel or unexpected calcification. Skull: Normal. Negative for fracture or focal lesion. Sinuses/Orbits: Paranasal sinuses and mastoid air cells are clear. Orbits are clear. Other: None. CT CERVICAL SPINE FINDINGS Alignment: Normal alignment of the cervical vertebral bodies. Skull base and vertebrae: Normal craniocervical junction. No loss of vertebral body height or disc height. Normal facet articulation. No evidence of fracture. Soft tissues and spinal canal: No prevertebral soft tissue swelling. No perispinal or epidural hematoma. Disc levels: Anterior cervical fusion C5-C6. Bulky osteophytes at C3-C4. No subluxation. Additional osteophytosis from C5-T1. Upper chest: Clear Other: None IMPRESSION: 1. No acute intracranial findings. 2. Mild atrophy and white matter microvascular disease. 3. No cervical spine fracture. 4. Stable anterior cervical fusion. Electronically Signed   By: Suzy Bouchard M.D.   On: 12/28/2019 12:09   CT Cervical Spine Wo Contrast  Result Date: 12/28/2019 CLINICAL DATA:  Fall. EXAM: CT HEAD WITHOUT  CONTRAST CT CERVICAL SPINE WITHOUT CONTRAST TECHNIQUE: Multidetector CT  imaging of the head and cervical spine was performed following the standard protocol without intravenous contrast. Multiplanar CT image reconstructions of the cervical spine were also generated. COMPARISON:  None. FINDINGS: CT HEAD FINDINGS Brain: No acute intracranial hemorrhage. No focal mass lesion. No CT evidence of acute infarction. No midline shift or mass effect. No hydrocephalus. Basilar cisterns are patent. There are periventricular and subcortical white matter hypodensities. Generalized cortical atrophy. Vascular: No hyperdense vessel or unexpected calcification. Skull: Normal. Negative for fracture or focal lesion. Sinuses/Orbits: Paranasal sinuses and mastoid air cells are clear. Orbits are clear. Other: None. CT CERVICAL SPINE FINDINGS Alignment: Normal alignment of the cervical vertebral bodies. Skull base and vertebrae: Normal craniocervical junction. No loss of vertebral body height or disc height. Normal facet articulation. No evidence of fracture. Soft tissues and spinal canal: No prevertebral soft tissue swelling. No perispinal or epidural hematoma. Disc levels: Anterior cervical fusion C5-C6. Bulky osteophytes at C3-C4. No subluxation. Additional osteophytosis from C5-T1. Upper chest: Clear Other: None IMPRESSION: 1. No acute intracranial findings. 2. Mild atrophy and white matter microvascular disease. 3. No cervical spine fracture. 4. Stable anterior cervical fusion. Electronically Signed   By: Suzy Bouchard M.D.   On: 01/03/2020 12:09   US RENAL  Result Date: 01/16/2020 CLINICAL DATA:  Acute kidney injury EXAM: RENAL / URINARY TRACT ULTRASOUND COMPLETE COMPARISON:  CT 11/01/2019 FINDINGS: Right Kidney: Renal measurements: 9.9 x 4.6 x 4.1 cm = volume: 95.9 mL . Echogenicity within normal limits. No mass or hydronephrosis visualized. Left Kidney: Renal measurements: 10.3 x 5.9 x 4.1 cm = volume: 127.6 mL. Cortical echogenicity normal. No hydronephrosis. Poorly visible. Cyst on CT is not  well visualized on sonography. Bladder: Not well visualized and may be empty. Other: Ascites in the upper abdomen. Possible contour nodularity of the liver. IMPRESSION: 1. Kidneys show no hydronephrosis. Left kidney is poorly visible. The cyst seen within the left kidney on CT is not well seen on ultrasound. 2. Small amount of ascites in the right upper quadrant. Possible cirrhotic change of the liver Electronically Signed   By: Donavan Foil M.D.   On: 01/01/2020 21:32   DG Pelvis Portable  Result Date: 01/02/2020 CLINICAL DATA:  Fall. Unsure how long patient was on the floor unattended. pain EXAM: PORTABLE PELVIS 1-2 VIEWS COMPARISON:  None. FINDINGS: Hip hips located. No pelvic fracture. No hip fracture. Degenerative changes at the hip joints IMPRESSION: No acute pelvic findings.  Degenerative change of the hips. Electronically Signed   By: Suzy Bouchard M.D.   On: 12/24/2019 11:35   DG CHEST PORT 1 VIEW  Result Date: 01/15/2020 CLINICAL DATA:  Shortness of breath. EXAM: PORTABLE CHEST 1 VIEW COMPARISON:  January 14, 2020. FINDINGS: Stable cardiomegaly with central pulmonary vascular congestion. Left-sided pacemaker is unchanged in position. No pneumothorax or pleural effusion is noted. No consolidative process is noted. Bony thorax is unremarkable. IMPRESSION: Stable cardiomegaly with central pulmonary vascular congestion. Electronically Signed   By: Marijo Conception M.D.   On: 01/15/2020 12:41   DG CHEST PORT 1 VIEW  Result Date: 01/14/2020 CLINICAL DATA:  Hypoxia. EXAM: PORTABLE CHEST 1 VIEW COMPARISON:  January 12, 2020. FINDINGS: Stable cardiomegaly with central pulmonary vascular congestion is noted. Left-sided pacemaker is unchanged in position. No pneumothorax or pleural effusion is noted. No significant consolidative process is noted. Bony thorax is unremarkable. IMPRESSION: Stable cardiomegaly with central pulmonary vascular congestion. Electronically Signed   By:  Marijo Conception M.D.   On:  01/14/2020 13:20   DG CHEST PORT 1 VIEW  Result Date: 01/12/2020 CLINICAL DATA:  Hypoxia EXAM: PORTABLE CHEST 1 VIEW COMPARISON:  January 09, 2020 FINDINGS: Stable cardiomegaly. Stable pacemaker. The right IJ is been removed. No pneumothorax. No nodules or masses. No focal infiltrates or overt edema. IMPRESSION: Cardiomegaly.  No acute abnormalities noted. Electronically Signed   By: Dorise Bullion III M.D   On: 01/12/2020 13:56   DG CHEST PORT 1 VIEW  Result Date: 01/09/2020 CLINICAL DATA:  Hypoxia. EXAM: PORTABLE CHEST 1 VIEW COMPARISON:  January 05, 2020. FINDINGS: Stable cardiomegaly with mild central pulmonary vascular congestion is noted. No pneumothorax is noted. No significant pleural effusion is noted. Left-sided pacemaker is unchanged in position. Right internal jugular catheter is noted. Mild bibasilar subsegmental atelectasis is noted. Bony thorax is unremarkable. IMPRESSION: Stable cardiomegaly with mild central pulmonary vascular congestion. Mild bibasilar subsegmental atelectasis is noted. Electronically Signed   By: Marijo Conception M.D.   On: 01/09/2020 10:17   DG CHEST PORT 1 VIEW  Result Date: 01/05/2020 CLINICAL DATA:  Hypoxia EXAM: PORTABLE CHEST 1 VIEW COMPARISON:  Chest x-rays dated 01/03/2020 and 01/02/2020. FINDINGS: Stable cardiomegaly. Mild central pulmonary vascular congestion, stable. No new lung findings. No pleural effusion or pneumothorax. LEFT chest wall pacemaker/ICD apparatus appears stable in position. RIGHT IJ central line in place with tip at the level of the mid/lower SVC. IMPRESSION: 1. Stable cardiomegaly with mild central pulmonary vascular congestion suggesting mild volume overload/CHF. 2. No new lung findings. No evidence of pneumonia. Electronically Signed   By: Franki Cabot M.D.   On: 01/05/2020 15:37   DG CHEST PORT 1 VIEW  Result Date: 01/03/2020 CLINICAL DATA:  Bedside central venous catheter placement. EXAM: PORTABLE CHEST 1 VIEW COMPARISON:  01/02/2020  and earlier. FINDINGS: Interval placement of a RIGHT jugular central venous catheter whose tip projects over the upper SVC. No evidence of pneumothorax or mediastinal hematoma. Interval removal of the LEFT-sided central venous catheter. Cardiac silhouette markedly enlarged, unchanged. Lungs clear. Pulmonary vascularity normal. No visible pleural effusions. LEFT subclavian dual lead transvenous pacemaker unchanged. IMPRESSION: 1. RIGHT jugular central venous catheter tip projects over the upper SVC. No acute complicating features. 2. No acute cardiopulmonary disease. Electronically Signed   By: Evangeline Dakin M.D.   On: 01/03/2020 19:46   DG CHEST PORT 1 VIEW  Result Date: 01/02/2020 CLINICAL DATA:  Acute respiratory failure with hypoxia. EXAM: PORTABLE CHEST 1 VIEW COMPARISON:  December 31, 2019. FINDINGS: Stable cardiomegaly. No pneumothorax or pleural effusion is noted. Lungs are clear. Left internal jugular catheter is unchanged in position. Left-sided pacemaker is unchanged. Bony thorax is unremarkable. IMPRESSION: No acute cardiopulmonary abnormality seen. Electronically Signed   By: Marijo Conception M.D.   On: 01/02/2020 09:36   DG CHEST PORT 1 VIEW  Result Date: 12/31/2019 CLINICAL DATA:  Acute respiratory failure with hypoxia. EXAM: PORTABLE CHEST 1 VIEW COMPARISON:  12/30/2019 FINDINGS: Stable enlargement of the cardiac silhouette. Stable position of the dual lead cardiac pacemaker. Left jugular central line is stable and the tip is pointing either anteriorly or posteriorly and probably within a small branch of the left innominate vein. Left jugular central line placement is stable from the previous examination. Negative for pneumothorax. Prominent interstitial lung markings bilaterally. Extensive degenerative changes in both shoulders. Surgical plate in the lower cervical spine. IMPRESSION: 1. Stable cardiomegaly. 2. Prominent interstitial lung markings. Cannot exclude interstitial edema. 3. No  change in the appearance or position of the left jugular central line. Suspect that the line is extending into a small branch off the left innominate vein. Electronically Signed   By: Markus Daft M.D.   On: 12/31/2019 08:13   DG CHEST PORT 1 VIEW  Result Date: 12/30/2019 CLINICAL DATA:  Shortness of breath EXAM: PORTABLE CHEST 1 VIEW COMPARISON:  December 29, 2019 FINDINGS: Stable cardiomegaly. A pacemaker is noted. The hila and mediastinum are normal. No pneumothorax. No nodules or masses. No focal infiltrates or overt edema. IMPRESSION: No active disease. Electronically Signed   By: Dorise Bullion III M.D   On: 12/30/2019 12:31   DG Chest Portable 1 View  Result Date: 12/23/2019 CLINICAL DATA:  Fall. Unsure how long patient was on the floor unattended. pain EXAM: PORTABLE CHEST 1 VIEW COMPARISON:  11/23/2019 FINDINGS: Enlarged cardiac silhouette. No effusion, infiltrate pneumothorax. Prominent hilar vasculature. Anterior cervical fusion IMPRESSION: Cardiomegaly without acute findings.  No change from Electronically Signed   By: Suzy Bouchard M.D.   On: 01/08/2020 11:34   ECHOCARDIOGRAM COMPLETE  Result Date: 12/30/2019    ECHOCARDIOGRAM REPORT   Patient Name:   ZIRA HELINSKI Date of Exam: 12/30/2019 Medical Rec #:  [id number]     Height:       67.0 in Accession #:    [id number]    Weight:       300.0 lb Date of Birth:  [birth year]    BSA:          2.403 m Patient Age:    69 years      BP:           118/42 mmHg Patient Gender: F             HR:           93 bpm. Exam Location:  Inpatient Procedure: 2D Echo, Cardiac Doppler and Color Doppler Indications:    R06.02 SOB  History:        Patient has prior history of Echocardiogram examinations, most                 recent 05/09/2018. CHF, Abnormal ECG, Pacemaker and SSS, COPD,                 Signs/Symptoms:Altered Mental Status and Chest Pain; Risk                 Factors:Hypertension, Dyslipidemia and Sleep Apnea. PFO. Tacky                 brady syndrome.  Rhabdomyolysis.  Sonographer:    Roseanna Rainbow RDCS Referring Phys: [id number] [place]  1. Left ventricular ejection fraction, by estimation, is 55 to 60%. The left ventricle has normal function. The left ventricle has no regional wall motion abnormalities. Left ventricular diastolic parameters are indeterminate.  2. Right ventricular systolic function is normal. The right ventricular size is normal. There is moderately elevated pulmonary artery systolic pressure.  3. Left atrial size was moderately dilated.  4. Right atrial size was severely dilated.  5. The mitral valve is grossly normal. Mild to moderate mitral valve regurgitation.  6. Tricuspid valve regurgitation is moderate.  7. The aortic valve is tricuspid. Aortic valve regurgitation is moderate. Mild aortic valve stenosis. FINDINGS  Left Ventricle: Left ventricular ejection fraction, by estimation, is 55 to 60%. The left ventricle has normal function. The left ventricle has no regional wall motion abnormalities. The left ventricular internal cavity  size was normal in size. There is  no left ventricular hypertrophy. Left ventricular diastolic parameters are indeterminate. Right Ventricle: The right ventricular size is normal. No increase in right ventricular wall thickness. Right ventricular systolic function is normal. There is moderately elevated pulmonary artery systolic pressure. The tricuspid regurgitant velocity is 2.81 m/s, and with an assumed right atrial pressure of 15 mmHg, the estimated right ventricular systolic pressure is 01.7 mmHg. Left Atrium: Left atrial size was moderately dilated. Right Atrium: Right atrial size was severely dilated. Pericardium: There is no evidence of pericardial effusion. Mitral Valve: The mitral valve is grossly normal. Mild mitral annular calcification. Mild to moderate mitral valve regurgitation. MV peak gradient, 17.6 mmHg. The mean mitral valve gradient is 5.0 mmHg. Tricuspid Valve: The tricuspid valve is  normal in structure. Tricuspid valve regurgitation is moderate. Aortic Valve: The aortic valve is tricuspid. . There is mild thickening and mild calcification of the aortic valve. Aortic valve regurgitation is moderate. Aortic regurgitation PHT measures 466 msec. Mild aortic stenosis is present. There is mild thickening of the aortic valve. There is mild calcification of the aortic valve. Aortic valve mean gradient measures 10.0 mmHg. Aortic valve peak gradient measures 22.7 mmHg. Aortic valve area, by VTI measures 2.40 cm. Pulmonic Valve: The pulmonic valve was normal in structure. Pulmonic valve regurgitation is not visualized. No evidence of pulmonic stenosis. Aorta: The aortic root and ascending aorta are structurally normal, with no evidence of dilitation. IAS/Shunts: The atrial septum is grossly normal. Additional Comments: A pacer wire is visualized.  LEFT VENTRICLE PLAX 2D LVIDd:         4.80 cm LVIDs:         3.20 cm LV PW:         1.70 cm LV IVS:        1.10 cm LVOT diam:     2.00 cm LV SV:         88 LV SV Index:   37 LVOT Area:     3.14 cm  LV Volumes (MOD) LV vol d, MOD A2C: 65.8 ml LV vol d, MOD A4C: 97.7 ml LV vol s, MOD A2C: 29.7 ml LV vol s, MOD A4C: 25.3 ml LV SV MOD A2C:     36.1 ml LV SV MOD A4C:     97.7 ml LV SV MOD BP:      53.1 ml RIGHT VENTRICLE             IVC RV S prime:     15.30 cm/s  IVC diam: 3.50 cm TAPSE (M-mode): 2.6 cm LEFT ATRIUM              Index       RIGHT ATRIUM           Index LA diam:        4.30 cm  1.79 cm/m  RA Area:     36.80 cm LA Vol (A2C):   120.0 ml 49.95 ml/m RA Volume:   149.00 ml 62.02 ml/m LA Vol (A4C):   99.3 ml  41.33 ml/m LA Biplane Vol: 110.0 ml 45.78 ml/m  AORTIC VALVE AV Area (Vmax):    2.31 cm AV Area (Vmean):   2.20 cm AV Area (VTI):     2.40 cm AV Vmax:           238.00 cm/s AV Vmean:          147.000 cm/s AV VTI:  0.366 m AV Peak Grad:      22.7 mmHg AV Mean Grad:      10.0 mmHg LVOT Vmax:         175.00 cm/s LVOT Vmean:         103.000 cm/s LVOT VTI:          0.280 m LVOT/AV VTI ratio: 0.77 AI PHT:            466 msec  AORTA Ao Root diam: 3.00 cm Ao Asc diam:  3.70 cm MITRAL VALVE                 TRICUSPID VALVE MV Peak grad: 17.6 mmHg      TR Peak grad:   31.6 mmHg MV Mean grad: 5.0 mmHg       TR Vmax:        281.00 cm/s MV Vmax:      2.10 m/s MV Vmean:     100.2 cm/s     SHUNTS MR Peak grad:    113.6 mmHg  Systemic VTI:  0.28 m MR Mean grad:    86.0 mmHg   Systemic Diam: 2.00 cm MR Vmax:         533.00 cm/s MR Vmean:        456.0 cm/s MR PISA:         2.26 cm MR PISA Eff ROA: 14 mm MR PISA Radius:  0.60 cm Mertie Moores MD Electronically signed by Mertie Moores MD Signature Date/Time: 12/30/2019/1:45:17 PM    Final    ECHOCARDIOGRAM LIMITED BUBBLE STUDY  Result Date: 01/04/2020    ECHOCARDIOGRAM LIMITED REPORT   Patient Name:   RONNELL MAKAREWICZ Date of Exam: 01/04/2020 Medical Rec #:  [id number]     Height:       67.0 in Accession #:    [id number]    Weight:       300.0 lb Date of Birth:  1946-06-23    BSA:          2.403 m Patient Age:    65 years      BP:           104/74 mmHg Patient Gender: F             HR:           96 bpm. Exam Location:  Inpatient Procedure: Limited Echo and Saline Contrast Bubble Study Indications:    I50.33 Acute on chronic diastolic (congestive) heart failure  History:        Patient has prior history of Echocardiogram examinations, most                 recent 12/30/2019. CHF, CAD and Previous Myocardial Infarction,                 Pacemaker, COPD, Arrythmias:Atrial Fibrillation and Atrial                 Flutter, Signs/Symptoms:Syncope; Risk Factors:Hypertension,                 Dyslipidemia and Sleep Apnea. Cancer. PFO. Hypothyroidism.  Sonographer:    Tiffany Dance Referring Phys: [id number] [place]  1. Limted bubble study Bi atrial enlargement with pacing wire in RA/RV. Mobile redundant atrial septum with positive bubble study for right to left shunt fairly large amount of bubbles crossing  septum. FINDINGS  IAS/Shunts: Agitated saline contrast was given intravenously to evaluate for intracardiac shunting. Additional Comments: Limted bubble study  Bi atrial enlargement with pacing wire in RA/RV. Mobile redundant atrial septum with positive bubble study for right to left shunt fairly large amount of bubbles crossing septum. Jenkins Rouge MD Electronically signed by Jenkins Rouge MD Signature Date/Time: 01/04/2020/9:33:17 AM    Final    [place] ASCITES (ABDOMEN LIMITED)  Result Date: 01/12/2020 CLINICAL DATA:  Ascites EXAM: LIMITED ABDOMEN ULTRASOUND FOR ASCITES TECHNIQUE: Limited ultrasound survey for ascites was performed in all four abdominal quadrants. COMPARISON:  Renal sonogram of 12/28/2019 and recent CT of the abdomen pelvis. FINDINGS: Imaging was performed in all 4 quadrants. Small to moderate volume of ascites was noted greatest in RIGHT lower and RIGHT upper quadrant. Smaller volume in LEFT lower quadrant. Coarse hepatic echotexture incidentally noted. IMPRESSION: Small moderate volume ascites. Nodular contour of the liver and coarsened hepatic echotexture remains suspicious for cirrhosis Electronically Signed   By: Zetta Bills M.D.   On: 01/12/2020 18:15   VAS [place] LOWER EXTREMITY VENOUS (DVT)  Result Date: 01/01/2020  Lower Venous DVTStudy Indications: Edema.  Risk Factors: Rhabdomyolysis after being down in floor for a week. Limitations: Bandages and pain with touch/compression. Comparison Study: Prior RLEV done 07/06/16 is available for comparison Performing Technologist: Sharion Dove RVS  Examination Guidelines: A complete evaluation includes B-mode imaging, spectral Doppler, color Doppler, and power Doppler as needed of all accessible portions of each vessel. Bilateral testing is considered an integral part of a complete examination. Limited examinations for reoccurring indications may be performed as noted. The reflux portion of the exam is performed with the patient in reverse  Trendelenburg.  +---------+---------------+---------+-----------+----------+--------------+ RIGHT    CompressibilityPhasicitySpontaneityPropertiesThrombus Aging +---------+---------------+---------+-----------+----------+--------------+ CFV      Full           Yes      Yes                                 +---------+---------------+---------+-----------+----------+--------------+ SFJ      Full                                                        +---------+---------------+---------+-----------+----------+--------------+ FV Prox                 Yes      Yes                                 +---------+---------------+---------+-----------+----------+--------------+ FV Mid                  Yes      Yes                                 +---------+---------------+---------+-----------+----------+--------------+ FV Distal               Yes      Yes                                 +---------+---------------+---------+-----------+----------+--------------+ POP                     Yes  Yes                                 +---------+---------------+---------+-----------+----------+--------------+ PTV                                                   Not visualized +---------+---------------+---------+-----------+----------+--------------+ PERO                                                  Not visualized +---------+---------------+---------+-----------+----------+--------------+   +---------+---------------+---------+-----------+----------+--------------+ LEFT     CompressibilityPhasicitySpontaneityPropertiesThrombus Aging +---------+---------------+---------+-----------+----------+--------------+ CFV      Full           Yes      Yes                                 +---------+---------------+---------+-----------+----------+--------------+ SFJ      Full                                                         +---------+---------------+---------+-----------+----------+--------------+ FV Prox                 Yes      Yes                                 +---------+---------------+---------+-----------+----------+--------------+ FV Mid                  Yes      Yes                                 +---------+---------------+---------+-----------+----------+--------------+ FV Distal               Yes      Yes                                 +---------+---------------+---------+-----------+----------+--------------+ POP      Full           Yes      Yes                                 +---------+---------------+---------+-----------+----------+--------------+ PTV                                                   Not visualized +---------+---------------+---------+-----------+----------+--------------+ PERO  Not visualized +---------+---------------+---------+-----------+----------+--------------+     Summary: RIGHT: - Findings appear essentially unchanged compared to previous examination. - There is no evidence of deep vein thrombosis in the lower extremity. However, portions of this examination were limited- see technologist comments above.  LEFT: - There is no evidence of deep vein thrombosis in the lower extremity. However, portions of this examination were limited- see technologist comments above.  *See table(s) above for measurements and observations. Electronically signed by Deitra Mayo MD on 01/01/2020 at 3:27:59 PM.    Final    VAS [place] UPPER EXTREMITY VENOUS DUPLEX  Result Date: 01/01/2020 UPPER VENOUS STUDY  Indications: Edema Risk Factors: Rhabdomyolysis after being down in floor for 1 week. Anticoagulation: Heparin. Limitations: Central line and bandage in left neck and body habitus. Comparison Study: Prior LUE venous duplex done 11/04/19 is available for                   comparison Performing Technologist: Sharion Dove RVS  Examination Guidelines: A complete evaluation includes B-mode imaging, spectral Doppler, color Doppler, and power Doppler as needed of all accessible portions of each vessel. Bilateral testing is considered an integral part of a complete examination. Limited examinations for reoccurring indications may be performed as noted.  Right Findings: +----------+------------+---------+-----------+----------+--------------+ RIGHT     CompressiblePhasicitySpontaneousProperties   Summary     +----------+------------+---------+-----------+----------+--------------+ IJV           Full                                                 +----------+------------+---------+-----------+----------+--------------+ Subclavian               Yes       Yes                             +----------+------------+---------+-----------+----------+--------------+ Axillary                                            Not visualized +----------+------------+---------+-----------+----------+--------------+ Brachial      Full                                                 +----------+------------+---------+-----------+----------+--------------+ Cephalic                                            Not visualized +----------+------------+---------+-----------+----------+--------------+ Basilic                                             Not visualized +----------+------------+---------+-----------+----------+--------------+  Left Findings: +----------+------------+---------+-----------+----------+--------------+ LEFT      CompressiblePhasicitySpontaneousProperties   Summary     +----------+------------+---------+-----------+----------+--------------+ IJV  Not visualized +----------+------------+---------+-----------+----------+--------------+ Subclavian               Yes       Yes                              +----------+------------+---------+-----------+----------+--------------+ Axillary                 Yes       Yes                             +----------+------------+---------+-----------+----------+--------------+ Brachial      Full       Yes       Yes                             +----------+------------+---------+-----------+----------+--------------+ Cephalic                                            Not visualized +----------+------------+---------+-----------+----------+--------------+ Basilic                                             Not visualized +----------+------------+---------+-----------+----------+--------------+  Summary:  Right: No evidence of deep vein thrombosis in the upper extremity. However, unable to visualize the axillary, cephalic, and basilic veins.  Left: No evidence of deep vein thrombosis in the upper extremity. However, unable to visualize the cephalic, basilic, and internal jugular veins.  *See table(s) above for measurements and observations.  Diagnosing physician: Deitra Mayo MD Electronically signed by Deitra Mayo MD on 01/01/2020 at 3:27:42 PM.    Final    US Abdomen Limited RUQ  Result Date: 01/15/2020 CLINICAL DATA:  Elevated LFT EXAM: ULTRASOUND ABDOMEN LIMITED RIGHT UPPER QUADRANT COMPARISON:  CT 11/01/2019 FINDINGS: Gallbladder: Sludge and small stones measuring up to 6 mm. Increased wall thickness at 4 mm. Negative sonographic Murphy. Common bile duct: Diameter: 3.2 mm Liver: Liver is slightly echogenic. Surface nodularity suspicious for cirrhosis. Portal vein is patent on color Doppler imaging with normal direction of blood flow towards the liver. Other: Ascites in the right upper quadrant IMPRESSION: 1. Sludge and stones within the gallbladder with nonspecific gallbladder wall thickening but negative sonographic Murphy. Findings could be secondary to acute or chronic cholecystitis, hepatic disease, or edema forming states. 2.  Slightly echogenic liver. Suspicion of subtle contour nodularity of the liver as may be seen with cirrhosis. Small amount of ascites in the right upper quadrant. Electronically Signed   By: Donavan Foil M.D.   On: 01/02/2020 23:05    Microbiology No results found for this or any previous visit (from the past 240 hour(s)).  Lab Basic Metabolic Panel: Recent Labs  Lab 01/15/20 0459  NA 148*  K 5.5*  CL 105  CO2 31  GLUCOSE 100*  BUN 106*  CREATININE 2.37*  CALCIUM 9.9  MG 2.6*  PHOS 5.4*   Liver Function Tests: Recent Labs  Lab 01/15/20 0459  AST 28  ALT 32  ALKPHOS 241*  BILITOT 1.6*  PROT 6.2*  ALBUMIN 3.7   No results for input(s): LIPASE, AMYLASE in the last 168 hours. Recent Labs  Lab 01/15/20 1207  AMMONIA 47*   CBC: Recent Labs  Lab 01/15/20 0459  WBC 4.8  NEUTROABS 3.6  HGB 8.3*  HCT 29.3*  MCV 106.9*  PLT 208   Cardiac Enzymes: No results for input(s): CKTOTAL, CKMB, CKMBINDEX, TROPONINI in the last 168 hours. Sepsis Labs: Recent Labs  Lab 01/15/20 0459  WBC 4.8    Procedures/Operations     Aryianna Earwood 01/21/2020, 11:55 AM

## 2020-08-08 IMAGING — DX DG CHEST 1V PORT
1 series · 1 of 1 positions shown · non-contrast
Comparison: January 09, 2020

CLINICAL DATA: Hypoxia

EXAM:
PORTABLE CHEST 1 VIEW

[chest]
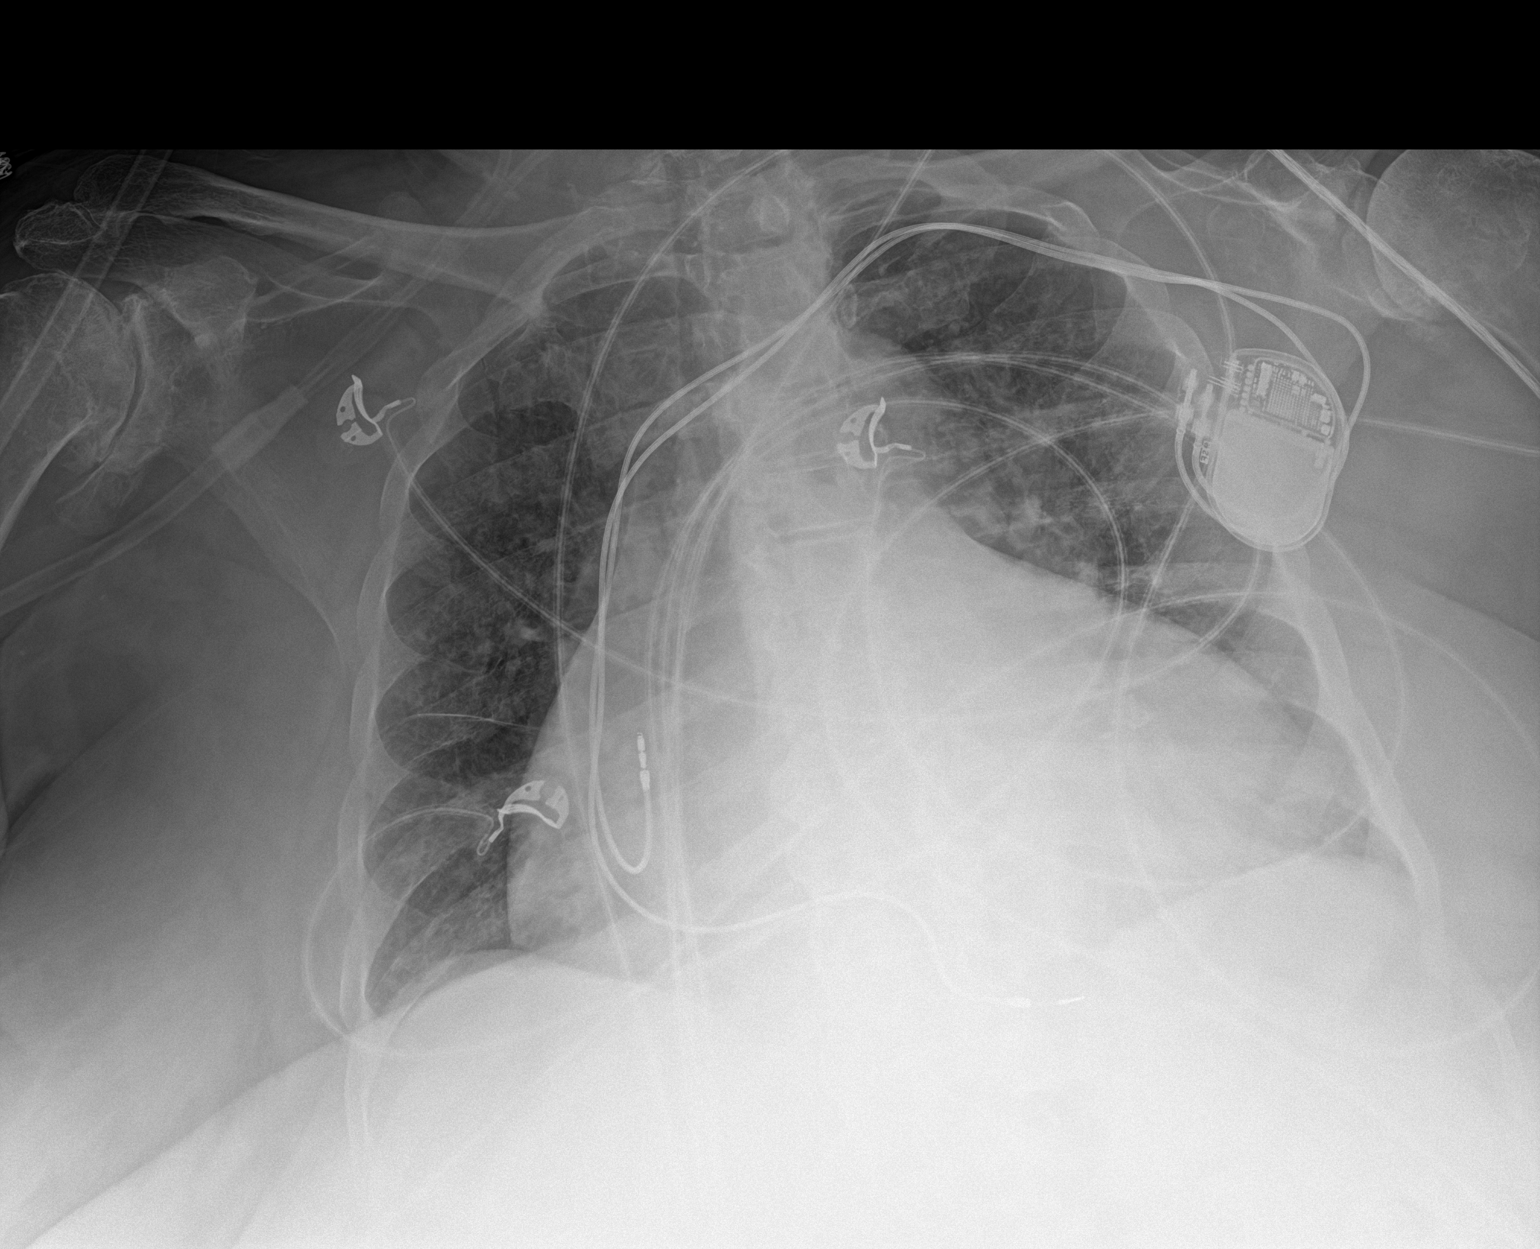

[1 of 1 positions shown; findings below may reference images not displayed]

FINDINGS: Stable cardiomegaly. Stable pacemaker. The right IJ is been removed.
No pneumothorax. No nodules or masses. No focal infiltrates or overt
edema.
IMPRESSION: Cardiomegaly.  No acute abnormalities noted.
